# Patient Record
Sex: Female | Born: 1935 | Race: White | Hispanic: No | Smoking: Never smoker
Health system: Southern US, Community
[De-identification: ages and names within clinical notes are randomized; demographics above are authoritative.]

## PROBLEM LIST (undated history)

## (undated) DIAGNOSIS — I10 Essential (primary) hypertension: Secondary | ICD-10-CM

## (undated) DIAGNOSIS — I480 Paroxysmal atrial fibrillation: Secondary | ICD-10-CM

## (undated) DIAGNOSIS — K219 Gastro-esophageal reflux disease without esophagitis: Secondary | ICD-10-CM

## (undated) DIAGNOSIS — E1149 Type 2 diabetes mellitus with other diabetic neurological complication: Secondary | ICD-10-CM

## (undated) DIAGNOSIS — R42 Dizziness and giddiness: Secondary | ICD-10-CM

## (undated) DIAGNOSIS — I639 Cerebral infarction, unspecified: Secondary | ICD-10-CM

## (undated) DIAGNOSIS — J449 Chronic obstructive pulmonary disease, unspecified: Secondary | ICD-10-CM

## (undated) DIAGNOSIS — J45909 Unspecified asthma, uncomplicated: Secondary | ICD-10-CM

## (undated) DIAGNOSIS — I739 Peripheral vascular disease, unspecified: Principal | ICD-10-CM

## (undated) DIAGNOSIS — IMO0001 Reserved for inherently not codable concepts without codable children: Secondary | ICD-10-CM

## (undated) DIAGNOSIS — E119 Type 2 diabetes mellitus without complications: Principal | ICD-10-CM

## (undated) HISTORY — PX: OTHER SURGICAL HISTORY: SHX169

## (undated) HISTORY — DX: Reserved for inherently not codable concepts without codable children: IMO0001

## (undated) HISTORY — PX: FOOT SURGERY: SHX648

## (undated) HISTORY — PX: FLEXIBLE SIGMOIDOSCOPY: SHX1649

## (undated) HISTORY — DX: Essential (primary) hypertension: I10

## (undated) HISTORY — PX: LAPAROTOMY: SHX154

## (undated) HISTORY — DX: Type 2 diabetes mellitus without complications: E11.9

## (undated) HISTORY — DX: Gastro-esophageal reflux disease without esophagitis: K21.9

## (undated) HISTORY — DX: Unspecified asthma, uncomplicated: J45.909

## (undated) HISTORY — PX: TUBAL LIGATION: SHX77

## (undated) HISTORY — DX: Type 2 diabetes mellitus with other diabetic neurological complication: E11.49

## (undated) HISTORY — DX: Cerebral infarction, unspecified: I63.9

## (undated) HISTORY — DX: Paroxysmal atrial fibrillation: I48.0

## (undated) HISTORY — DX: Peripheral vascular disease, unspecified: I73.9

## (undated) HISTORY — PX: TONSILLECTOMY: SUR1361

---

## 1969-05-08 HISTORY — PX: ABDOMINAL HYSTERECTOMY: SHX81

## 1969-05-08 HISTORY — PX: APPENDECTOMY: SHX54

## 1991-12-15 ENCOUNTER — Encounter: Payer: Self-pay | Admitting: Gastroenterology

## 1995-11-15 ENCOUNTER — Encounter: Payer: Self-pay | Admitting: Gastroenterology

## 1995-11-21 ENCOUNTER — Encounter: Payer: Self-pay | Admitting: Gastroenterology

## 1997-10-26 ENCOUNTER — Ambulatory Visit (HOSPITAL_COMMUNITY): Admission: RE | Admit: 1997-10-26 | Discharge: 1997-10-26 | Payer: Self-pay | Admitting: General Surgery

## 1997-10-28 ENCOUNTER — Ambulatory Visit (HOSPITAL_COMMUNITY): Admission: RE | Admit: 1997-10-28 | Discharge: 1997-10-28 | Payer: Self-pay | Admitting: General Surgery

## 1998-03-09 ENCOUNTER — Ambulatory Visit (HOSPITAL_COMMUNITY): Admission: RE | Admit: 1998-03-09 | Discharge: 1998-03-09 | Payer: Self-pay | Admitting: *Deleted

## 1998-03-10 ENCOUNTER — Ambulatory Visit (HOSPITAL_COMMUNITY): Admission: RE | Admit: 1998-03-10 | Discharge: 1998-03-10 | Payer: Self-pay | Admitting: *Deleted

## 1998-04-08 ENCOUNTER — Encounter: Payer: Self-pay | Admitting: Gastroenterology

## 1998-04-09 ENCOUNTER — Inpatient Hospital Stay (HOSPITAL_COMMUNITY): Admission: RE | Admit: 1998-04-09 | Discharge: 1998-04-18 | Payer: Self-pay | Admitting: *Deleted

## 1998-09-21 ENCOUNTER — Other Ambulatory Visit: Admission: RE | Admit: 1998-09-21 | Discharge: 1998-09-21 | Payer: Self-pay | Admitting: Obstetrics & Gynecology

## 1998-12-12 ENCOUNTER — Ambulatory Visit (HOSPITAL_COMMUNITY): Admission: RE | Admit: 1998-12-12 | Discharge: 1998-12-12 | Payer: Self-pay | Admitting: Gastroenterology

## 1999-01-14 ENCOUNTER — Ambulatory Visit (HOSPITAL_COMMUNITY): Admission: RE | Admit: 1999-01-14 | Discharge: 1999-01-14 | Payer: Self-pay | Admitting: Gastroenterology

## 1999-10-26 ENCOUNTER — Other Ambulatory Visit: Admission: RE | Admit: 1999-10-26 | Discharge: 1999-10-26 | Payer: Self-pay | Admitting: Obstetrics & Gynecology

## 2000-11-21 ENCOUNTER — Other Ambulatory Visit: Admission: RE | Admit: 2000-11-21 | Discharge: 2000-11-21 | Payer: Self-pay | Admitting: Obstetrics & Gynecology

## 2000-12-10 ENCOUNTER — Encounter: Payer: Self-pay | Admitting: General Surgery

## 2000-12-14 ENCOUNTER — Encounter (INDEPENDENT_AMBULATORY_CARE_PROVIDER_SITE_OTHER): Payer: Self-pay | Admitting: Specialist

## 2000-12-14 ENCOUNTER — Ambulatory Visit: Admission: RE | Admit: 2000-12-14 | Discharge: 2000-12-14 | Payer: Self-pay | Admitting: General Surgery

## 2001-06-21 ENCOUNTER — Encounter: Payer: Self-pay | Admitting: Urology

## 2001-06-21 ENCOUNTER — Encounter: Admission: RE | Admit: 2001-06-21 | Discharge: 2001-06-21 | Payer: Self-pay | Admitting: Urology

## 2001-06-24 ENCOUNTER — Ambulatory Visit (HOSPITAL_BASED_OUTPATIENT_CLINIC_OR_DEPARTMENT_OTHER): Admission: RE | Admit: 2001-06-24 | Discharge: 2001-06-24 | Payer: Self-pay | Admitting: Urology

## 2001-12-19 ENCOUNTER — Other Ambulatory Visit: Admission: RE | Admit: 2001-12-19 | Discharge: 2001-12-19 | Payer: Self-pay | Admitting: Obstetrics & Gynecology

## 2002-05-27 ENCOUNTER — Encounter (INDEPENDENT_AMBULATORY_CARE_PROVIDER_SITE_OTHER): Payer: Self-pay | Admitting: Cardiology

## 2002-05-27 ENCOUNTER — Ambulatory Visit (HOSPITAL_COMMUNITY): Admission: RE | Admit: 2002-05-27 | Discharge: 2002-05-27 | Payer: Self-pay | Admitting: Family Medicine

## 2002-06-23 ENCOUNTER — Encounter: Payer: Self-pay | Admitting: Gastroenterology

## 2002-08-07 ENCOUNTER — Ambulatory Visit (HOSPITAL_COMMUNITY): Admission: RE | Admit: 2002-08-07 | Discharge: 2002-08-07 | Payer: Self-pay | Admitting: Pulmonary Disease

## 2002-08-07 ENCOUNTER — Encounter: Payer: Self-pay | Admitting: Pulmonary Disease

## 2002-09-01 ENCOUNTER — Encounter: Payer: Self-pay | Admitting: Gastroenterology

## 2003-02-05 ENCOUNTER — Other Ambulatory Visit: Admission: RE | Admit: 2003-02-05 | Discharge: 2003-02-05 | Payer: Self-pay | Admitting: Obstetrics & Gynecology

## 2003-04-08 ENCOUNTER — Encounter: Payer: Self-pay | Admitting: Gastroenterology

## 2005-01-11 ENCOUNTER — Ambulatory Visit: Payer: Self-pay | Admitting: Gastroenterology

## 2005-01-31 ENCOUNTER — Ambulatory Visit: Payer: Self-pay | Admitting: Gastroenterology

## 2005-01-31 ENCOUNTER — Encounter (INDEPENDENT_AMBULATORY_CARE_PROVIDER_SITE_OTHER): Payer: Self-pay | Admitting: Specialist

## 2005-02-01 ENCOUNTER — Encounter: Payer: Self-pay | Admitting: Gastroenterology

## 2005-02-01 ENCOUNTER — Encounter: Admission: RE | Admit: 2005-02-01 | Discharge: 2005-02-01 | Payer: Self-pay | Admitting: Gastroenterology

## 2005-02-23 ENCOUNTER — Other Ambulatory Visit: Admission: RE | Admit: 2005-02-23 | Discharge: 2005-02-23 | Payer: Self-pay | Admitting: Obstetrics & Gynecology

## 2005-03-09 ENCOUNTER — Ambulatory Visit: Payer: Self-pay | Admitting: Gastroenterology

## 2008-06-14 ENCOUNTER — Inpatient Hospital Stay (HOSPITAL_COMMUNITY): Admission: EM | Admit: 2008-06-14 | Discharge: 2008-06-18 | Payer: Self-pay | Admitting: Emergency Medicine

## 2008-06-16 ENCOUNTER — Encounter (INDEPENDENT_AMBULATORY_CARE_PROVIDER_SITE_OTHER): Payer: Self-pay | Admitting: Internal Medicine

## 2008-07-15 ENCOUNTER — Ambulatory Visit: Payer: Self-pay | Admitting: Family Medicine

## 2008-07-15 DIAGNOSIS — K219 Gastro-esophageal reflux disease without esophagitis: Secondary | ICD-10-CM

## 2008-07-15 DIAGNOSIS — J45909 Unspecified asthma, uncomplicated: Secondary | ICD-10-CM | POA: Insufficient documentation

## 2008-07-15 DIAGNOSIS — IMO0002 Reserved for concepts with insufficient information to code with codable children: Secondary | ICD-10-CM | POA: Insufficient documentation

## 2008-07-15 DIAGNOSIS — I10 Essential (primary) hypertension: Secondary | ICD-10-CM | POA: Insufficient documentation

## 2008-07-15 DIAGNOSIS — R32 Unspecified urinary incontinence: Secondary | ICD-10-CM | POA: Insufficient documentation

## 2008-07-15 DIAGNOSIS — E119 Type 2 diabetes mellitus without complications: Secondary | ICD-10-CM

## 2008-07-15 DIAGNOSIS — K5730 Diverticulosis of large intestine without perforation or abscess without bleeding: Secondary | ICD-10-CM | POA: Insufficient documentation

## 2008-07-15 DIAGNOSIS — E1165 Type 2 diabetes mellitus with hyperglycemia: Secondary | ICD-10-CM | POA: Insufficient documentation

## 2008-07-15 HISTORY — DX: Unspecified asthma, uncomplicated: J45.909

## 2008-07-15 HISTORY — DX: Gastro-esophageal reflux disease without esophagitis: K21.9

## 2008-07-15 HISTORY — DX: Essential (primary) hypertension: I10

## 2008-07-15 HISTORY — DX: Type 2 diabetes mellitus without complications: E11.9

## 2008-07-15 LAB — CONVERTED CEMR LAB: Blood Glucose, Fingerstick: 195

## 2008-07-20 ENCOUNTER — Telehealth: Payer: Self-pay | Admitting: Family Medicine

## 2008-07-30 ENCOUNTER — Ambulatory Visit: Payer: Self-pay | Admitting: Family Medicine

## 2008-08-13 ENCOUNTER — Ambulatory Visit: Payer: Self-pay | Admitting: Family Medicine

## 2008-10-15 ENCOUNTER — Ambulatory Visit: Payer: Self-pay | Admitting: Family Medicine

## 2008-10-19 ENCOUNTER — Telehealth: Payer: Self-pay | Admitting: Family Medicine

## 2008-10-22 ENCOUNTER — Encounter: Payer: Self-pay | Admitting: Family Medicine

## 2008-10-23 ENCOUNTER — Telehealth: Payer: Self-pay | Admitting: Family Medicine

## 2008-11-12 ENCOUNTER — Ambulatory Visit: Payer: Self-pay | Admitting: Family Medicine

## 2008-11-12 DIAGNOSIS — E039 Hypothyroidism, unspecified: Secondary | ICD-10-CM | POA: Insufficient documentation

## 2008-11-13 LAB — CONVERTED CEMR LAB
BUN: 17 mg/dL (ref 6–23)
CO2: 27 meq/L (ref 19–32)
Calcium: 9.4 mg/dL (ref 8.4–10.5)
Chloride: 101 meq/L (ref 96–112)
Cholesterol: 168 mg/dL (ref 0–200)
Creatinine, Ser: 0.8 mg/dL (ref 0.4–1.2)
Direct LDL: 109.3 mg/dL
GFR calc non Af Amer: 75.04 mL/min (ref >60)
Glucose, Bld: 161 mg/dL — ABNORMAL HIGH (ref 70–99)
HDL: 22.6 mg/dL — ABNORMAL LOW (ref >39.00)
Hgb A1c MFr Bld: 7.5 % — ABNORMAL HIGH (ref 4.6–6.5)
Potassium: 4.9 meq/L (ref 3.5–5.1)
Sodium: 139 meq/L (ref 135–145)
TSH: 1.71 microintl units/mL (ref 0.35–5.50)
Total CHOL/HDL Ratio: 7
Triglycerides: 276 mg/dL — ABNORMAL HIGH (ref 0.0–149.0)
VLDL: 55.2 mg/dL — ABNORMAL HIGH (ref 0.0–40.0)

## 2008-11-25 ENCOUNTER — Ambulatory Visit: Payer: Self-pay | Admitting: Pulmonary Disease

## 2008-11-25 ENCOUNTER — Encounter: Payer: Self-pay | Admitting: Pulmonary Disease

## 2008-11-25 DIAGNOSIS — G4733 Obstructive sleep apnea (adult) (pediatric): Secondary | ICD-10-CM | POA: Insufficient documentation

## 2008-11-25 DIAGNOSIS — J309 Allergic rhinitis, unspecified: Secondary | ICD-10-CM | POA: Insufficient documentation

## 2008-11-29 ENCOUNTER — Encounter: Payer: Self-pay | Admitting: Pulmonary Disease

## 2008-12-22 ENCOUNTER — Telehealth: Payer: Self-pay | Admitting: Family Medicine

## 2008-12-22 ENCOUNTER — Telehealth: Payer: Self-pay | Admitting: Pulmonary Disease

## 2008-12-23 ENCOUNTER — Ambulatory Visit: Payer: Self-pay | Admitting: Pulmonary Disease

## 2009-01-01 ENCOUNTER — Ambulatory Visit: Payer: Self-pay | Admitting: Pulmonary Disease

## 2009-01-29 ENCOUNTER — Ambulatory Visit: Payer: Self-pay | Admitting: Pulmonary Disease

## 2009-01-29 DIAGNOSIS — R490 Dysphonia: Secondary | ICD-10-CM | POA: Insufficient documentation

## 2009-02-03 LAB — CONVERTED CEMR LAB
ALT: 24 units/L (ref 0–35)
AST: 26 units/L (ref 0–37)
Albumin: 3.9 g/dL (ref 3.5–5.2)
Alkaline Phosphatase: 65 units/L (ref 39–117)
BUN: 15 mg/dL (ref 6–23)
Basophils Absolute: 0.1 10*3/uL (ref 0.0–0.1)
Basophils Relative: 0.7 % (ref 0.0–3.0)
Bilirubin, Direct: 0.1 mg/dL (ref 0.0–0.3)
CO2: 28 meq/L (ref 19–32)
Calcium: 9.5 mg/dL (ref 8.4–10.5)
Chloride: 101 meq/L (ref 96–112)
Creatinine, Ser: 0.9 mg/dL (ref 0.4–1.2)
Eosinophils Absolute: 0.5 10*3/uL (ref 0.0–0.7)
Eosinophils Relative: 4.5 % (ref 0.0–5.0)
GFR calc non Af Amer: 65.46 mL/min (ref >60)
Glucose, Bld: 120 mg/dL — ABNORMAL HIGH (ref 70–99)
HCT: 37 % (ref 36.0–46.0)
Hemoglobin: 12.4 g/dL (ref 12.0–15.0)
IgE (Immunoglobulin E), Serum: 86.5 intl units/mL (ref 0.0–180.0)
Lymphocytes Relative: 29.5 % (ref 12.0–46.0)
Lymphs Abs: 3 10*3/uL (ref 0.7–4.0)
MCHC: 33.5 g/dL (ref 30.0–36.0)
MCV: 86.2 fL (ref 78.0–100.0)
Monocytes Absolute: 0.9 10*3/uL (ref 0.1–1.0)
Monocytes Relative: 8.7 % (ref 3.0–12.0)
Neutro Abs: 5.7 10*3/uL (ref 1.4–7.7)
Neutrophils Relative %: 56.6 % (ref 43.0–77.0)
Platelets: 250 10*3/uL (ref 150.0–400.0)
Potassium: 4 meq/L (ref 3.5–5.1)
RBC: 4.3 M/uL (ref 3.87–5.11)
RDW: 14.4 % (ref 11.5–14.6)
Sodium: 136 meq/L (ref 135–145)
Total Bilirubin: 0.5 mg/dL (ref 0.3–1.2)
Total Protein: 7.4 g/dL (ref 6.0–8.3)
WBC: 10.2 10*3/uL (ref 4.5–10.5)

## 2009-02-12 ENCOUNTER — Ambulatory Visit: Payer: Self-pay | Admitting: Family Medicine

## 2009-02-12 LAB — CONVERTED CEMR LAB: Hgb A1c MFr Bld: 7.5 % — ABNORMAL HIGH (ref 4.6–6.5)

## 2009-02-23 ENCOUNTER — Ambulatory Visit: Payer: Self-pay | Admitting: Pulmonary Disease

## 2009-03-04 ENCOUNTER — Encounter: Payer: Self-pay | Admitting: Pulmonary Disease

## 2009-03-04 ENCOUNTER — Encounter: Payer: Self-pay | Admitting: Family Medicine

## 2009-03-11 ENCOUNTER — Encounter (INDEPENDENT_AMBULATORY_CARE_PROVIDER_SITE_OTHER): Payer: Self-pay | Admitting: Otolaryngology

## 2009-03-11 ENCOUNTER — Ambulatory Visit (HOSPITAL_COMMUNITY): Admission: RE | Admit: 2009-03-11 | Discharge: 2009-03-12 | Payer: Self-pay | Admitting: Otolaryngology

## 2009-03-22 ENCOUNTER — Telehealth: Payer: Self-pay | Admitting: Family Medicine

## 2009-03-24 ENCOUNTER — Inpatient Hospital Stay (HOSPITAL_COMMUNITY): Admission: AD | Admit: 2009-03-24 | Discharge: 2009-03-27 | Payer: Self-pay | Admitting: Cardiovascular Disease

## 2009-05-31 ENCOUNTER — Telehealth: Payer: Self-pay | Admitting: Family Medicine

## 2009-06-10 ENCOUNTER — Encounter: Payer: Self-pay | Admitting: Family Medicine

## 2009-06-14 ENCOUNTER — Ambulatory Visit: Payer: Self-pay | Admitting: Surgery

## 2009-06-18 DIAGNOSIS — I739 Peripheral vascular disease, unspecified: Secondary | ICD-10-CM

## 2009-06-18 HISTORY — DX: Peripheral vascular disease, unspecified: I73.9

## 2009-06-18 HISTORY — PX: CAROTID ENDARTERECTOMY: SUR193

## 2009-06-22 ENCOUNTER — Ambulatory Visit: Payer: Self-pay | Admitting: Surgery

## 2009-06-22 ENCOUNTER — Inpatient Hospital Stay (HOSPITAL_COMMUNITY): Admission: RE | Admit: 2009-06-22 | Discharge: 2009-06-23 | Payer: Self-pay | Admitting: Surgery

## 2009-06-23 ENCOUNTER — Encounter: Payer: Self-pay | Admitting: Surgery

## 2009-06-24 LAB — HM COLONOSCOPY

## 2009-07-12 ENCOUNTER — Ambulatory Visit: Payer: Self-pay | Admitting: Surgery

## 2009-07-13 ENCOUNTER — Ambulatory Visit: Payer: Self-pay | Admitting: Family Medicine

## 2009-07-13 DIAGNOSIS — E785 Hyperlipidemia, unspecified: Secondary | ICD-10-CM | POA: Insufficient documentation

## 2009-07-15 LAB — CONVERTED CEMR LAB
ALT: 28 units/L (ref 0–35)
AST: 37 units/L (ref 0–37)
Albumin: 3.9 g/dL (ref 3.5–5.2)
Alkaline Phosphatase: 83 units/L (ref 39–117)
BUN: 21 mg/dL (ref 6–23)
Bilirubin, Direct: 0.1 mg/dL (ref 0.0–0.3)
CO2: 31 meq/L (ref 19–32)
Calcium: 9.6 mg/dL (ref 8.4–10.5)
Chloride: 103 meq/L (ref 96–112)
Cholesterol: 90 mg/dL (ref 0–200)
Creatinine, Ser: 0.8 mg/dL (ref 0.4–1.2)
Creatinine,U: 133.8 mg/dL
Direct LDL: 42.4 mg/dL
GFR calc non Af Amer: 74.9 mL/min (ref >60)
Glucose, Bld: 141 mg/dL — ABNORMAL HIGH (ref 70–99)
HDL: 26.9 mg/dL — ABNORMAL LOW (ref >39.00)
Hgb A1c MFr Bld: 7.4 % — ABNORMAL HIGH (ref 4.6–6.5)
Microalb Creat Ratio: 349.8 mg/g — ABNORMAL HIGH (ref 0.0–30.0)
Microalb, Ur: 46.8 mg/dL — ABNORMAL HIGH (ref 0.0–1.9)
Potassium: 4.6 meq/L (ref 3.5–5.1)
Sodium: 141 meq/L (ref 135–145)
TSH: 2.49 microintl units/mL (ref 0.35–5.50)
Total Bilirubin: 0.2 mg/dL — ABNORMAL LOW (ref 0.3–1.2)
Total CHOL/HDL Ratio: 3
Total Protein: 7.2 g/dL (ref 6.0–8.3)
Triglycerides: 257 mg/dL — ABNORMAL HIGH (ref 0.0–149.0)
VLDL: 51.4 mg/dL — ABNORMAL HIGH (ref 0.0–40.0)

## 2009-07-27 ENCOUNTER — Encounter (INDEPENDENT_AMBULATORY_CARE_PROVIDER_SITE_OTHER): Payer: Self-pay | Admitting: *Deleted

## 2009-07-28 ENCOUNTER — Telehealth: Payer: Self-pay | Admitting: Family Medicine

## 2009-08-16 ENCOUNTER — Inpatient Hospital Stay (HOSPITAL_COMMUNITY): Admission: EM | Admit: 2009-08-16 | Discharge: 2009-08-21 | Payer: Self-pay | Admitting: Emergency Medicine

## 2009-08-23 ENCOUNTER — Inpatient Hospital Stay (HOSPITAL_COMMUNITY): Admission: EM | Admit: 2009-08-23 | Discharge: 2009-08-25 | Payer: Self-pay | Admitting: Emergency Medicine

## 2009-08-23 ENCOUNTER — Encounter (INDEPENDENT_AMBULATORY_CARE_PROVIDER_SITE_OTHER): Payer: Self-pay | Admitting: *Deleted

## 2009-08-30 ENCOUNTER — Telehealth: Payer: Self-pay | Admitting: Family Medicine

## 2009-09-02 ENCOUNTER — Ambulatory Visit: Payer: Self-pay | Admitting: Family Medicine

## 2009-09-02 DIAGNOSIS — E1140 Type 2 diabetes mellitus with diabetic neuropathy, unspecified: Secondary | ICD-10-CM | POA: Insufficient documentation

## 2009-09-02 DIAGNOSIS — E1149 Type 2 diabetes mellitus with other diabetic neurological complication: Secondary | ICD-10-CM

## 2009-09-02 DIAGNOSIS — K5732 Diverticulitis of large intestine without perforation or abscess without bleeding: Secondary | ICD-10-CM | POA: Insufficient documentation

## 2009-09-02 HISTORY — DX: Type 2 diabetes mellitus with other diabetic neurological complication: E11.49

## 2009-09-03 LAB — CONVERTED CEMR LAB
BUN: 10 mg/dL (ref 6–23)
Basophils Absolute: 0.1 10*3/uL (ref 0.0–0.1)
Basophils Relative: 0.4 % (ref 0.0–3.0)
CO2: 30 meq/L (ref 19–32)
Calcium: 9.5 mg/dL (ref 8.4–10.5)
Chloride: 96 meq/L (ref 96–112)
Creatinine, Ser: 0.8 mg/dL (ref 0.4–1.2)
Eosinophils Absolute: 0.3 10*3/uL (ref 0.0–0.7)
Eosinophils Relative: 2.5 % (ref 0.0–5.0)
GFR calc non Af Amer: 74.87 mL/min (ref >60)
Glucose, Bld: 172 mg/dL — ABNORMAL HIGH (ref 70–99)
HCT: 40 % (ref 36.0–46.0)
Hemoglobin: 13.3 g/dL (ref 12.0–15.0)
Lymphocytes Relative: 23.8 % (ref 12.0–46.0)
Lymphs Abs: 3 10*3/uL (ref 0.7–4.0)
MCHC: 33.3 g/dL (ref 30.0–36.0)
MCV: 87.3 fL (ref 78.0–100.0)
Monocytes Absolute: 0.7 10*3/uL (ref 0.1–1.0)
Monocytes Relative: 5.7 % (ref 3.0–12.0)
Neutro Abs: 8.4 10*3/uL — ABNORMAL HIGH (ref 1.4–7.7)
Neutrophils Relative %: 67.6 % (ref 43.0–77.0)
Platelets: 347 10*3/uL (ref 150.0–400.0)
Potassium: 3.9 meq/L (ref 3.5–5.1)
RBC: 4.59 M/uL (ref 3.87–5.11)
RDW: 16.2 % — ABNORMAL HIGH (ref 11.5–14.6)
Sodium: 136 meq/L (ref 135–145)
WBC: 12.5 10*3/uL — ABNORMAL HIGH (ref 4.5–10.5)

## 2009-09-16 ENCOUNTER — Ambulatory Visit: Payer: Self-pay | Admitting: Family Medicine

## 2009-11-15 ENCOUNTER — Telehealth: Payer: Self-pay | Admitting: Family Medicine

## 2009-11-22 ENCOUNTER — Encounter (INDEPENDENT_AMBULATORY_CARE_PROVIDER_SITE_OTHER): Payer: Self-pay | Admitting: *Deleted

## 2009-12-16 ENCOUNTER — Encounter: Payer: Self-pay | Admitting: Family Medicine

## 2009-12-17 ENCOUNTER — Ambulatory Visit: Payer: Self-pay | Admitting: Family Medicine

## 2009-12-17 LAB — CONVERTED CEMR LAB: Hgb A1c MFr Bld: 8.1 % — ABNORMAL HIGH (ref 4.6–6.5)

## 2009-12-24 ENCOUNTER — Ambulatory Visit: Payer: Self-pay | Admitting: Family Medicine

## 2009-12-24 LAB — HM DIABETES FOOT EXAM

## 2009-12-24 LAB — CONVERTED CEMR LAB
Cholesterol, target level: 200 mg/dL
HDL goal, serum: 40 mg/dL
LDL Goal: 100 mg/dL

## 2009-12-24 LAB — HM DIABETES EYE EXAM: HM Diabetic Eye Exam: NORMAL

## 2010-01-12 ENCOUNTER — Ambulatory Visit: Payer: Self-pay | Admitting: Gastroenterology

## 2010-01-12 DIAGNOSIS — R141 Gas pain: Secondary | ICD-10-CM | POA: Insufficient documentation

## 2010-01-12 DIAGNOSIS — R143 Flatulence: Secondary | ICD-10-CM

## 2010-01-12 DIAGNOSIS — R195 Other fecal abnormalities: Secondary | ICD-10-CM | POA: Insufficient documentation

## 2010-01-12 DIAGNOSIS — R142 Eructation: Secondary | ICD-10-CM

## 2010-01-17 ENCOUNTER — Ambulatory Visit: Payer: Self-pay | Admitting: Gastroenterology

## 2010-01-17 ENCOUNTER — Ambulatory Visit (HOSPITAL_COMMUNITY)
Admission: RE | Admit: 2010-01-17 | Discharge: 2010-01-17 | Payer: Self-pay | Source: Home / Self Care | Admitting: Gastroenterology

## 2010-01-23 ENCOUNTER — Encounter: Payer: Self-pay | Admitting: Family Medicine

## 2010-01-30 ENCOUNTER — Encounter: Payer: Self-pay | Admitting: Family Medicine

## 2010-02-16 ENCOUNTER — Ambulatory Visit: Payer: Self-pay | Admitting: Family Medicine

## 2010-03-15 ENCOUNTER — Encounter: Payer: Self-pay | Admitting: Family Medicine

## 2010-03-25 ENCOUNTER — Ambulatory Visit: Payer: Self-pay | Admitting: Family Medicine

## 2010-03-25 DIAGNOSIS — L821 Other seborrheic keratosis: Secondary | ICD-10-CM | POA: Insufficient documentation

## 2010-03-29 ENCOUNTER — Telehealth: Payer: Self-pay | Admitting: Family Medicine

## 2010-03-29 LAB — CONVERTED CEMR LAB
Hgb A1c MFr Bld: 7.8 % — ABNORMAL HIGH (ref 4.6–6.5)
TSH: 2.2 microintl units/mL (ref 0.35–5.50)

## 2010-04-27 ENCOUNTER — Telehealth: Payer: Self-pay | Admitting: Family Medicine

## 2010-06-09 NOTE — Progress Notes (Signed)
Summary: PRIOR AUTH   Phone Note Call from Patient Call back at Home Phone [phone]   Caller: PT LIVE` Call For: Casey Peat MD Summary of Call: NEEDS PRIOR AUTH ON Casey Harrell HAS [employer] INS # [id number] [facility]  Initial call taken by: Roselle Locus,  October 19, 2008 1:30 PM  Follow-up for Phone Call        [facility] # [id number] [facility] WILL FAX FORM  Roselle Locus  October 19, 2008 3:31 PM form from ins to Dr Haskell Riling  October 20, 2008 7:44 AM  Additional Follow-up for Phone Call Additional follow up Details #1::        humana approved singulair Roselle Locus  October 23, 2008 9:11 AM

## 2010-06-09 NOTE — Letter (Signed)
Summary: Diabetic Eye Exam/[facility]  Diabetic Eye Exam/[facility]   Imported By: Maryln Gottron 03/25/2010 13:19:25  _____________________________________________________________________  External Attachment:    Type:   Image     Comment:   External Document

## 2010-06-09 NOTE — Procedures (Signed)
Summary: EGD   EGD  Procedure date:  09/01/2002  Findings:      Location: [facility]   Patient Name: Casey Harrell, Casey Harrell MRN:  Procedure Procedures: Panendoscopy (EGD) CPT: 43235.  Personnel: Endoscopist: Venita Lick. Russella Dar, MD, Clementeen Graham.  Exam Location: Exam performed in Outpatient Clinic. Outpatient  Patient Consent: Procedure, Alternatives, Risks and Benefits discussed, consent obtained, from patient. Consent was obtained by the RN.  Indications Symptoms: Reflux symptoms  History  Pre-Exam Physical: Performed Sep 01, 2002  Entire physical exam was normal.  Exam Exam Info: Maximum depth of insertion Duodenum, intended Duodenum. Vocal cords not visualized. Gastric retroflexion performed. ASA Classification: II. Tolerance: good.  Sedation Meds: Patient assessed and found to be appropriate for moderate (conscious) sedation. Fentanyl 50 mcg. given IV. Versed 5 mg. given IV. Cetacaine Spray 2 sprays given aerosolized.  Monitoring: BP and pulse monitoring done. Oximetry used. Supplemental O2 given  Findings Normal: Proximal Esophagus to Duodenal 2nd Portion.   Events  Unplanned Intervention: No unplanned interventions were required.  Unplanned Events: There were no complications. Plans Medication(s): Continue current medications.  Patient Education: Patient given standard instructions for: Reflux.  Disposition: After procedure patient sent to recovery. After recovery patient sent home.  Scheduling: Referring provider, to Danice Goltz, MD,  Office Visit, to Venita Lick. Russella Dar, MD, [facility], around Dec 01, 2002.    This report was created from the original endoscopy report, which was reviewed and signed by the above listed endoscopist.    cc: Danice Goltz, MD

## 2010-06-09 NOTE — Letter (Signed)
Summary: [facility] Instructions  [facility]  [address] [place], [place] [address]   Phone: [phone]  Fax: [phone]       Casey Harrell    12-14-1934    MRN: [id number]        Procedure Day /Date: Monday September 12th, 2011     Arrival Time: 8:30am     Procedure Time: 9:30am     Location of Procedure:                     _x _  [facility] ( Outpatient Registration)                        PREPARATION FOR COLONOSCOPY WITH MOVIPREP   Starting 5 days prior to your procedure 01/12/10 do not eat nuts, seeds, popcorn, corn, beans, peas,  salads, or any raw vegetables.  Do not take any fiber supplements (e.g. Metamucil, Citrucel, and Benefiber).  THE DAY BEFORE YOUR PROCEDURE         DATE: 01/16/10  DAY: Sunday  1.  Drink clear liquids the entire day-NO SOLID FOOD  2.  Do not drink anything colored red or purple.  Avoid juices with pulp.  No orange juice.  3.  Drink at least 64 oz. (8 glasses) of fluid/clear liquids during the day to prevent dehydration and help the prep work efficiently.  CLEAR LIQUIDS INCLUDE: Water Jello Ice Popsicles Tea (sugar ok, no milk/cream) Powdered fruit flavored drinks Coffee (sugar ok, no milk/cream) Gatorade Juice: apple, white grape, white cranberry  Lemonade Clear bullion, consomm, broth Carbonated beverages (any kind) Strained chicken noodle soup Hard Candy                             4.  In the morning, mix first dose of MoviPrep solution:    Empty 1 Pouch A and 1 Pouch B into the disposable container    Add lukewarm drinking water to the top line of the container. Mix to dissolve    Refrigerate (mixed solution should be used within 24 hrs)  5.  Begin drinking the prep at 5:00 p.m. The MoviPrep container is divided by 4 marks.   Every 15 minutes drink the solution down to the next mark (approximately 8 oz) until the full liter is complete.   6.  Follow completed prep with 16 oz of clear liquid of your  choice (Nothing red or purple).  Continue to drink clear liquids until bedtime.  7.  Before going to bed, mix second dose of MoviPrep solution:    Empty 1 Pouch A and 1 Pouch B into the disposable container    Add lukewarm drinking water to the top line of the container. Mix to dissolve    Refrigerate  THE DAY OF YOUR PROCEDURE      DATE: 01/17/10 DAY: Monday  Beginning at 4:30 a.m. (5 hours before procedure):         1. Every 15 minutes, drink the solution down to the next mark (approx 8 oz) until the full liter is complete.  2. Follow completed prep with 16 oz. of clear liquid of your choice.    3. You may drink clear liquids until 5:30am (4 HOURS BEFORE PROCEDURE).   MEDICATION INSTRUCTIONS  Unless otherwise instructed, you should take regular prescription medications with a small sip of water   as early as possible the morning  of your procedure.  Diabetic patients - see separate instructions.       OTHER INSTRUCTIONS  You will need a responsible adult at least 74 years of age to accompany you and drive you home.   This person must remain in the waiting room during your procedure.  Wear loose fitting clothing that is easily removed.  Leave jewelry and other valuables at home.  However, you may wish to bring a book to read or  an iPod/MP3 player to listen to music as you wait for your procedure to start.  Remove all body piercing jewelry and leave at home.  Total time from sign-in until discharge is approximately 2-3 hours.  You should go home directly after your procedure and rest.  You can resume normal activities the  day after your procedure.  The day of your procedure you should not:   Drive   Make legal decisions   Operate machinery   Drink alcohol   Return to work  You will receive specific instructions about eating, activities and medications before you leave.    The above instructions have been reviewed and explained to me by   Marchelle Folks.      I fully understand and can verbalize these instructions _____________________________ Date _________

## 2010-06-09 NOTE — Op Note (Signed)
[facility]   Imported By: Sherian Rein 09/08/2009 14:09:02  _____________________________________________________________________  External Attachment:    Type:   Image     Comment:   External Document

## 2010-06-09 NOTE — Op Note (Signed)
Summary: [facility]   Imported By: Sherian Rein 09/08/2009 14:04:15  _____________________________________________________________________  External Attachment:    Type:   Image     Comment:   External Document

## 2010-06-09 NOTE — Assessment & Plan Note (Signed)
Summary: hem pos/lk   History of Present Illness Visit Type: Initial Consult Primary GI MD: Elie Goody MD [facility] Primary Provider: Evelena Peat MD Requesting Provider: Varney Baas, MD Chief Complaint: Hem positive stool History of Present Illness:   This is a 76 year old female, who was found to have Hemoccult-positive stool. She notes an increase in gas and bloating over the past several months that have responded partially to Gas-X. She previously underwent colonoscopy in September 2006 showing internal and external hemorrhoids, diverticulosis, and hyperplastic colon polyps.   GI Review of Systems    Reports acid reflux and  weight gain.      Denies abdominal pain, belching, bloating, chest pain, dysphagia with liquids, dysphagia with solids, heartburn, loss of appetite, nausea, vomiting, vomiting blood, and  weight loss.      Reports diverticulosis, heme positive stool, and  hemorrhoids.     Denies anal fissure, black tarry stools, change in bowel habit, constipation, diarrhea, fecal incontinence, irritable bowel syndrome, jaundice, light color stool, liver problems, rectal bleeding, and  rectal pain.   Current Medications (verified): 1)  Synthroid 50 Mcg Tabs (Levothyroxine Sodium) .... Once Daily 2)  Cardizem Cd 120 Mg Xr24h-Cap (Diltiazem Hcl Coated Beads) .... Once Daily 3)  Benicar 20 Mg Tabs (Olmesartan Medoxomil) .... One By Mouth Daily 4)  Aspirin 81 Mg Tbec (Aspirin) .... Once Daily 5)  Simvastatin 20 Mg Tabs (Simvastatin) .... One Tab At Bedtime Daily 6)  Metformin Hcl 1000 Mg Tabs (Metformin Hcl) .... One By Mouth Bid 7)  Lantus Solostar 100 Unit/ml Soln (Insulin Glargine) .... 90 Units Daily 8)  Cymbalta 60 Mg Cpep (Duloxetine Hcl) .... Once Daily 9)  Bd U/f Short Pen Needle 31g X 8 Mm Misc (Insulin Pen Needle) .... Use As Directed 10)  Biotin .Marland Kitchen.. 1 By Mouth Daily 11)  Carvedilol 25 Mg Tabs (Carvedilol) .... One Tab Two Times A Day 12)  Nexium 40 Mg Cpdr  (Esomeprazole Magnesium) .... One By Mouth Once Daily 13)  Novolog Flexpen 100 Unit/ml Soln (Insulin Aspart) .... 9 Units With Breakfast and Supper, 5 Units At Lunchtime 14)  Amitriptyline Hcl 25 Mg Tabs (Amitriptyline Hcl) .... One Tab At Bedtime 15)  Niaspan 500 Mg Cr-Tabs (Niacin (Antihyperlipidemic)) .... One By Mouth Once Daily  Allergies (verified): 1)  ! Doxycycline Hyclate (Doxycycline Hyclate) 2)  ! Avelox (Moxifloxacin Hcl) 3)  Morphine Sulfate (Morphine Sulfate)  Past History:  Past Medical History: Reviewed history from 09/07/2009 and no changes required. Asthma      - Spirometry 11/25/08 FEV1 1.48(86%), FVC 1.85(75%), FEV1% 80      - IgE 01/29/09 was 86.5      - CXR 7/21, normal Allergic rhinitis Diverticulosis, colon GERD Hypertension Urinary incontinence Diabetes Diabetic peripheral neuropathy OSA, intolerant of CPAP Hypothyroidism Diverticulosis Hemorrhoids Hyperplastic polyps 01/2005  Past Surgical History: Appendectomy 1971 Hysterectomy  1971 Tonsillectomy Tubal ligation C-section Sigmoid colectomy for diverticulitis Laparotomy x 3 with adhesions lysis Right foot surgery x 2  Family History: Reviewed history from 09/07/2009 and no changes required. Family History of Arthritis  patent Family History Diabetes 1st degree relative  parent Family History Lung cancer  grandparent Family History of Stroke F 1st degree relative <60  grandparent Family History of Cardiovascular disorder  parent Family History of Celiac Disease:Sister  Social History: Reviewed history from 07/15/2008 and no changes required. Retired Married Never Smoked Alcohol use-no  Review of Systems       The patient complains of cough, fatigue, hearing problems,  itching, muscle pains/cramps, night sweats, skin rash, thirst - excessive, and urine leakage.         The pertinent positives and negatives are noted as above and in the HPI. All other ROS were reviewed and were  negative.   Vital Signs:  Patient profile:   76 year old female Height:      61 inches Weight:      153.13 pounds BMI:     29.04 Pulse rate:   68 / minute Pulse rhythm:   regular BP sitting:   120 / 78  (left arm) Cuff size:   regular  Vitals Entered By: June McMurray CMA Duncan Dull) (January 12, 2010 10:49 AM)  Physical Exam  General:  Well developed, well nourished, no acute distress.  Head:  Normocephalic and atraumatic. Eyes:  PERRLA, no icterus. Mouth:  No deformity or lesions, dentition normal. Lungs:  Clear throughout to auscultation. Heart:  Regular rate and rhythm; no murmurs, rubs,  or bruits. Abdomen:  Soft, nontender and nondistended. No masses, hepatosplenomegaly or hernias noted. Normal bowel sounds. Rectal:  deferred until time of colonoscopy.   Pulses:  Normal pulses noted. Extremities:  No clubbing, cyanosis, edema or deformities noted. Neurologic:  Alert and  oriented x4;  grossly normal neurologically. Cervical Nodes:  No significant cervical adenopathy. Inguinal Nodes:  No significant inguinal adenopathy. Psych:  Alert and cooperative. Normal mood and affect.  Impression & Recommendations:  Problem # 1:  NONSPECIFIC ABNORMAL FINDING IN STOOL CONTENTS (ICD-792.1) Rule out colorectal neoplasms and AVMs, hemorrhoids or other GI sources of occult blood loss. The risks, benefits and alternatives to colonoscopy with possible biopsy and possible polypectomy were discussed with the patient and they consent to proceed. The procedure will be scheduled electively. Orders: Colonoscopy (Colon)  Problem # 2:  FLATULENCE ERUCTATION AND GAS PAIN (ICD-787.3) Begin a low gas diet, and a trial of probiotics. Consider a trial of Xifaxan. Orders: Colonoscopy (Colon)  Problem # 3:  GERD (ICD-530.81) Continue Nexium 40 mg daily, and standard antireflux measures.  Patient Instructions: 1)  Start Align one tablet by mouth once daily x 1 month and if this medication helps then  continue as needed. 2)  Excessive Gas Diet handout given.  3)  Colonoscopy brochure given.  4)  Copy sent to : Varney Baas, MD 5)  The medication list was reviewed and reconciled.  All changed / newly prescribed medications were explained.  A complete medication list was provided to the patient / caregiver.  Prescriptions: MOVIPREP 100 GM  SOLR (PEG-KCL-NACL-NASULF-NA ASC-C) As per prep instructions.  #1 x 0   Entered by:   Christie Nottingham CMA (AAMA)   Authorized by:   Meryl Dare MD [facility]   Signed by:   Meryl Dare MD [facility] on 01/12/2010   Method used:   [facility] [address]       [address]       [place], [place]  [address]       Ph: [phone] or [phone]       Fax: [phone]   RxID:   [id number]

## 2010-06-09 NOTE — Medication Information (Signed)
Summary: Order for Glucose Testing Supplies  Order for Glucose Testing Supplies   Imported By: Maryln Gottron 12/20/2009 11:27:01  _____________________________________________________________________  External Attachment:    Type:   Image     Comment:   External Document

## 2010-06-09 NOTE — Medication Information (Signed)
Summary: Order for Diabetic Testing Supplies  Order for Diabetic Testing Supplies   Imported By: Maryln Gottron 02/02/2010 10:35:22  _____________________________________________________________________  External Attachment:    Type:   Image     Comment:   External Document

## 2010-06-09 NOTE — Assessment & Plan Note (Signed)
Summary: [name]/FUP/RCD   Vital Signs:  Patient profile:   76 year old female Weight:      153 pounds Temp:     98.3 degrees F oral BP sitting:   120 / 70  (left arm) Cuff size:   regular  Vitals Entered By: Sid Falcon LPN (December 24, 2009 11:15 AM)  Serial Vital Signs/Assessments:  Time      Position  BP       Pulse  Resp  Temp     By                     120/70                         Casey Peat MD   History of Present Illness: Here for follow up diabetes. REcent CBGs have been up -reviewed and fastings 150 to 221. On Lantus 30 and 45 (she is spliting dose) and Novolog 9 units at breakfast and supper. Recent A1C 8.1%.  Diabetes Management History:      She has not been enrolled in the "Diabetic Education Program".  She states understanding of dietary principles and is following her diet appropriately.  Sensory loss is noted.  Self foot exams are being performed.  She is checking home blood sugars.  She says that she is not exercising regularly.        Hypoglycemic symptoms are not occurring.  No hyperglycemic symptoms are reported.        The following changes have been made to her treatment plan since last visit: medication changes.  Treatment plan changes were initiated by patient.    Hypertension History:      She denies headache, chest pain, palpitations, dyspnea with exertion, orthopnea, peripheral edema, neurologic problems, syncope, and side effects from treatment.        Positive major cardiovascular risk factors include female age 44 years old or older, diabetes, hyperlipidemia, and hypertension.  Negative major cardiovascular risk factors include non-tobacco-user status.        Further assessment for target organ damage reveals no history of ASHD, stroke/TIA, or peripheral vascular disease.    Lipid Management History:      Positive NCEP/ATP III risk factors include female age 35 years old or older, diabetes, HDL cholesterol less than 40, and hypertension.   Negative NCEP/ATP III risk factors include non-tobacco-user status, no ASHD (atherosclerotic heart disease), no prior stroke/TIA, no peripheral vascular disease, and no history of aortic aneurysm.      Allergies: 1)  ! Doxycycline Hyclate (Doxycycline Hyclate) 2)  ! Avelox (Moxifloxacin Hcl) 3)  Morphine Sulfate (Morphine Sulfate)  Past History:  Past Medical History: Last updated: 09/07/2009 Asthma      - Spirometry 11/25/08 FEV1 1.48(86%), FVC 1.85(75%), FEV1% 80      - IgE 01/29/09 was 86.5      - CXR 7/21, normal Allergic rhinitis Diverticulosis, colon GERD Hypertension Urinary incontinence Diabetes Diabetic peripheral neuropathy OSA, intolerant of CPAP Hypothyroidism Diverticulosis Hemorrhoids Hyperplastic polyps 01/2005  Past Surgical History: Last updated: 09/07/2009 Appendectomy 1971 Hysterectomy  1971 Tonsillectomy Tubal ligation C-section Sigmoid colectomy Laparotomy x 3 with adhesions lysis Right foot surgery x 2  Social History: Last updated: 07/15/2008 Retired Married Never Smoked Alcohol use-no  Review of Systems      See HPI  Physical Exam  General:  Well-developed,well-nourished,in no acute distress; alert,appropriate and cooperative throughout examination Ears:  External ear exam shows no significant lesions  or deformities.  Otoscopic examination reveals clear canals, tympanic membranes are intact bilaterally without bulging, retraction, inflammation or discharge. Hearing is grossly normal bilaterally. Mouth:  Oral mucosa and oropharynx without lesions or exudates.  Teeth in good repair. Neck:  No deformities, masses, or tenderness noted. Lungs:  Normal respiratory effort, chest expands symmetrically. Lungs are clear to auscultation, no crackles or wheezes. Heart:  normal rate and regular rhythm.   Extremities:  no edema. Neurologic:  alert & oriented X3 and cranial nerves II-XII intact.   Skin:  no rashes and no suspicious lesions.     Cervical Nodes:  No lymphadenopathy noted  Diabetes Management Exam:    Foot Exam (with socks and/or shoes not present):       Sensory-Pinprick/Light touch:          Left medial foot (L-4): diminished          Left dorsal foot (L-5): diminished          Left lateral foot (S-1): diminished          Right medial foot (L-4): diminished          Right dorsal foot (L-5): diminished          Right lateral foot (S-1): diminished       Sensory-Monofilament:          Left foot: diminished          Right foot: diminished       Inspection:          Left foot: normal          Right foot: normal       Nails:          Left foot: normal          Right foot: normal    Eye Exam:       Eye Exam done here today          Results: normal   Impression & Recommendations:  Problem # 1:  DIABETES-TYPE 2 (ICD-250.00) Assessment Deteriorated Titrate Lantus and for simplification, go to once daily.  Add lunchtime Novolog. Her updated medication list for this problem includes:    Benicar 20 Mg Tabs (Olmesartan medoxomil) ..... One by mouth daily    Aspirin 81 Mg Tbec (Aspirin) ..... Once daily    Metformin Hcl 1000 Mg Tabs (Metformin hcl) ..... One by mouth bid    Lantus Solostar 100 Unit/ml Soln (Insulin glargine) .Marland KitchenMarland KitchenMarland KitchenMarland Kitchen 80 units daily    Novolog Flexpen 100 Unit/ml Soln (Insulin aspart) .Marland KitchenMarland KitchenMarland KitchenMarland Kitchen 5 units with breakfast and supper  Problem # 2:  DYSLIPIDEMIA (ICD-272.4)  Her updated medication list for this problem includes:    Simvastatin 20 Mg Tabs (Simvastatin) ..... One tab at bedtime daily    Niaspan 500 Mg Cr-tabs (Niacin (antihyperlipidemic)) ..... One by mouth once daily  Problem # 3:  HYPERTENSION (ICD-401.9)  Her updated medication list for this problem includes:    Cardizem Cd 120 Mg Xr24h-cap (Diltiazem hcl coated beads) ..... Once daily    Benicar 20 Mg Tabs (Olmesartan medoxomil) ..... One by mouth daily    Carvedilol 25 Mg Tabs (Carvedilol) ..... One tab two times a day  Complete  Medication List: 1)  Atrovent 0.5 % Soln (ipratropium Bromide)  .... Nebulized inhalation every 6 hours as needed 2)  Synthroid 50 Mcg Tabs (Levothyroxine sodium) .... Once daily 3)  Cardizem Cd 120 Mg Xr24h-cap (Diltiazem hcl coated beads) .... Once daily 4)  Benicar 20 Mg Tabs (  Olmesartan medoxomil) .... One by mouth daily 5)  Aspirin 81 Mg Tbec (Aspirin) .... Once daily 6)  Simvastatin 20 Mg Tabs (Simvastatin) .... One tab at bedtime daily 7)  Metformin Hcl 1000 Mg Tabs (Metformin hcl) .... One by mouth bid 8)  Lantus Solostar 100 Unit/ml Soln (Insulin glargine) .... 80 units daily 9)  Cymbalta 60 Mg Cpep (Duloxetine hcl) .... Once daily 10)  Bd U/f Short Pen Needle 31g X 8 Mm Misc (Insulin pen needle) .... Use as directed 11)  Biotin  .Marland Kitchen.. 1 by mouth daily 12)  Carvedilol 25 Mg Tabs (Carvedilol) .... One tab two times a day 13)  Nexium 40 Mg Cpdr (Esomeprazole magnesium) .... One by mouth once daily 14)  Novolog Flexpen 100 Unit/ml Soln (Insulin aspart) .... 5 units with breakfast and supper 15)  Amitriptyline Hcl 25 Mg Tabs (Amitriptyline hcl) .... One tab at bedtime 16)  Niaspan 500 Mg Cr-tabs (Niacin (antihyperlipidemic)) .... One by mouth once daily  Diabetes Management Assessment/Plan:      The following lipid goals have been established for the patient: Total cholesterol goal of 200; LDL cholesterol goal of 100; HDL cholesterol goal of 40; Triglyceride goal of 150.  Her blood pressure goal is < 130/80.    Hypertension Assessment/Plan:      The patient's hypertensive risk group is category C: Target organ damage and/or diabetes.  Today's blood pressure is 120/70.  Her blood pressure goal is < 130/80.  Lipid Assessment/Plan:      Based on NCEP/ATP III, the patient's risk factor category is "history of diabetes".  The patient's lipid goals are as follows: Total cholesterol goal is 200; LDL cholesterol goal is 100; HDL cholesterol goal is 40; Triglyceride goal is 150.    Patient  Instructions: 1)  Take Lantus once daily and go to 80 units. 2)  Consider adding 5 units of Novolog insulin at lunch. 3)  Titrate Lantus 2 units every 2 days for fasting sugar > 130. 4)  Please schedule a follow-up appointment in 3 months .

## 2010-06-09 NOTE — Assessment & Plan Note (Signed)
Summary: 4 month follow-up visit/pt rsc/cjr   Vital Signs:  Patient profile:   76 year old female Weight:      152 pounds Temp:     98.0 degrees F oral BP sitting:   130 / 70  (left arm) BP standing:   130 / 70 Cuff size:   regular  Vitals Entered By: Sid Falcon LPN (July 13, 1608 11:09 AM)  Serial Vital Signs/Assessments:  Time      Position  BP       Pulse  Resp  Temp     By           Lying LA  138/80                         Evelena Peat MD           Standing  130/70                         Evelena Peat MD  CC: 4 month follow-up   History of Present Illness: Patient seen for followup regarding multiple medical problems.  Recent hospitalization in November of 2010 for syncope and orthostatic changes. CT angiogram revealed no evidence for pulmonary emboli. Patient had several blood pressure medications adjusted during that visit has done better since then. Recent nuclear stress test unremarkable. Right carotid endarterectomy approximately 3 weeks ago for 90% right carotid lesion. She has done well following that. She did not have any preceding TIA type symptoms. No recent dizziness or syncope with medication changes.  Blood pressure medicines are reviewed. Denies any chest pains or palpitations. Compliant with all medications.  Type 2 diabetes with history of control. Currently on Lantus 30 units morning 45 units evening. Also takes metformin and Byetta. History of diabetic neuropathy which is unchanged. Recent blood sugars fasting 120s to 130s.  Hypothyroidism treated with Synthroid. Needs refills.  Diabetes Management History:      She states understanding of dietary principles and is following her diet appropriately.  No sensory loss is reported.  Self foot exams are being performed.  She is checking home blood sugars.  She says that she is not exercising regularly.    Preventive Screening-Counseling & Management  Caffeine-Diet-Exercise     Does Patient Exercise:  no  Allergies: 1)  ! Doxycycline Hyclate (Doxycycline Hyclate) 2)  ! Avelox (Moxifloxacin Hcl) 3)  Morphine Sulfate (Morphine Sulfate)  Past History:  Past Medical History: Last updated: 02/23/2009 Asthma      - Spirometry 11/25/08 FEV1 1.48(86%), FVC 1.85(75%), FEV1% 80      - IgE 01/29/09 was 86.5      - CXR 7/21, normal Allergic rhinitis Diverticulosis, colon GERD Hypertension Urinary incontinence Diabetes Diabetic peripheral neuropathy OSA, intolerant of CPAP  Social History: Last updated: 07/15/2008 Retired Married Never Smoked Alcohol use-no PMH reviewed for relevance, PSH reviewed for relevance  Social History: Does Patient Exercise:  no  Review of Systems  The patient denies anorexia, fever, weight loss, vision loss, chest pain, syncope, dyspnea on exertion, peripheral edema, prolonged cough, headaches, hemoptysis, abdominal pain, melena, and hematochezia.    Physical Exam  General:  Well-developed,well-nourished,in no acute distress; alert,appropriate and cooperative throughout examination Eyes:  pupils equal, pupils round, and pupils reactive to light.   Ears:  External ear exam shows no significant lesions or deformities.  Otoscopic examination reveals clear canals, tympanic membranes are intact bilaterally without bulging, retraction, inflammation or  discharge. Hearing is grossly normal bilaterally. Mouth:  Oral mucosa and oropharynx without lesions or exudates.  Teeth in good repair. Neck:  large scar right neck from previous surgery. No signs of erythema. Nontender Lungs:  Normal respiratory effort, chest expands symmetrically. Lungs are clear to auscultation, no crackles or wheezes. Heart:  normal rate and regular rhythm.   Extremities:  no edema. Neurologic:  alert & oriented X3, cranial nerves II-XII intact, strength normal in all extremities, and gait normal.   Psych:  normally interactive, good eye contact, not anxious appearing, and not depressed  appearing.    Diabetes Management Exam:    Foot Exam (with socks and/or shoes not present):       Sensory-Pinprick/Light touch:          Left medial foot (L-4): normal          Left dorsal foot (L-5): normal          Left lateral foot (S-1): normal          Right medial foot (L-4): normal          Right dorsal foot (L-5): normal          Right lateral foot (S-1): normal       Sensory-Monofilament:          Left foot: normal          Right foot: normal       Inspection:          Left foot: normal          Right foot: normal       Nails:          Left foot: normal          Right foot: normal    Eye Exam:       Eye Exam done elsewhere          Date: 02/05/2009          Results: diabetic retinopathy          Done by: Dr Dione Booze   Impression & Recommendations:  Problem # 1:  DIABETES-TYPE 2 (ICD-250.00) Recheck A1C. Her updated medication list for this problem includes:    Benicar 20 Mg Tabs (Olmesartan medoxomil) ..... Once daily    Aspirin 81 Mg Tbec (Aspirin) ..... Once daily    Byetta 10 Mcg Pen 10 Mcg/0.85ml Soln (Exenatide) ..... Once daily    Metformin Hcl 1000 Mg Tabs (Metformin hcl) ..... One by mouth bid    Lantus Solostar 100 Unit/ml Soln (Insulin glargine) .Marland KitchenMarland KitchenMarland KitchenMarland Kitchen 30 units q am and 45 units q pm    Aspirin 81 Mg Tabs (Aspirin) .Marland Kitchen... 2 tabs daily  Orders: TLB-A1C / Hgb A1C (Glycohemoglobin) (83036-A1C) TLB-Microalbumin/Creat Ratio, Urine (82043-MALB)  Problem # 2:  HYPERTENSION (ICD-401.9) meds updated and refills given. Her updated medication list for this problem includes:    Cardizem Cd 120 Mg Xr24h-cap (Diltiazem hcl coated beads) ..... Once daily    Benicar 20 Mg Tabs (Olmesartan medoxomil) ..... Once daily    Carvedilol 25 Mg Tabs (Carvedilol) ..... One tab two times a day  Orders: TLB-BMP (Basic Metabolic Panel-BMET) (80048-METABOL) Venipuncture ([address])  Problem # 3:  HYPOTHYROIDISM (ICD-244.9)  Her updated medication list for this problem includes:     Synthroid 50 Mcg Tabs (Levothyroxine sodium) ..... Qd  Orders: TLB-TSH (Thyroid Stimulating Hormone) (84443-TSH) Venipuncture (60454)  Problem # 4:  DYSLIPIDEMIA (ICD-272.4)  Her updated medication list for this problem includes:    Simvastatin  20 Mg Tabs (Simvastatin) ..... One tab at bedtime daily  Orders: TLB-Lipid Panel (80061-LIPID) TLB-Hepatic/Liver Function Pnl (80076-HEPATIC) Venipuncture (04540)  Complete Medication List: 1)  Atrovent 0.5 % Soln (ipratropium Bromide)  .... Nebulized inhalation every 6 hours as needed 2)  Nasonex 50 Mcg/act Susp (Mometasone furoate) .... As needed 3)  Loratadine 10 Mg Tabs (Loratadine) .... As needed 4)  Synthroid 50 Mcg Tabs (Levothyroxine sodium) .... Qd 5)  Cardizem Cd 120 Mg Xr24h-cap (Diltiazem hcl coated beads) .... Once daily 6)  Benicar 20 Mg Tabs (Olmesartan medoxomil) .... Once daily 7)  Aspirin 81 Mg Tbec (Aspirin) .... Once daily 8)  Simvastatin 20 Mg Tabs (Simvastatin) .... One tab at bedtime daily 9)  Byetta 10 Mcg Pen 10 Mcg/0.22ml Soln (Exenatide) .... Once daily 10)  Metformin Hcl 1000 Mg Tabs (Metformin hcl) .... One by mouth bid 11)  Lantus Solostar 100 Unit/ml Soln (Insulin glargine) .... 30 units q am and 45 units q pm 12)  Lyrica 150 Mg Caps (Pregabalin) .... Daily two times a day 13)  Cymbalta 60 Mg Cpep (Duloxetine hcl) .... Once daily 14)  Amitriptyline Hcl 25 Mg Tabs (Amitriptyline hcl) .... At bedtime 15)  Bd U/f Short Pen Needle 31g X 8 Mm Misc (Insulin pen needle) .... Use as directed 16)  Biotin  .Marland Kitchen.. 1 by mouth daily 17)  Tramadol Hcl 50 Mg Tabs (Tramadol hcl) .... As needed. 18)  Aspirin 81 Mg Tabs (Aspirin) .... 2 tabs daily 19)  Carvedilol 25 Mg Tabs (Carvedilol) .... One tab two times a day  Patient Instructions: 1)  Please schedule a follow-up appointment in 3 months .  2)  Check your blood sugars regularly. If your readings are usually above:  or below 70 you should contact our office.  3)  It is  important that your diabetic A1c level is checked every 3 months.  4)  See your eye doctor yearly to check for diabetic eye damage. 5)  Check your feet each night  for sore areas, calluses or signs of infection.  Prescriptions: CARVEDILOL 25 MG TABS (CARVEDILOL) one tab two times a day  #180 x 3   Entered and Authorized by:   Evelena Peat MD   Signed by:   Evelena Peat MD on 07/13/2009   Method used:   Faxed to ...       [facility] (mail-order)             , [place]         Ph: [phone]       Fax: [phone]   RxID:   [id number] AMITRIPTYLINE HCL 25 MG TABS (AMITRIPTYLINE HCL) at bedtime  #90 x 3   Entered and Authorized by:   Evelena Peat MD   Signed by:   Evelena Peat MD on 07/13/2009   Method used:   Faxed to ...       [facility] (mail-order)             , [place]         Ph: [phone]       Fax: [phone]   RxID:   [id number] CYMBALTA 60 MG CPEP (DULOXETINE HCL) once daily  #90 x 3   Entered and Authorized by:   Evelena Peat MD   Signed by:   Evelena Peat MD on 07/13/2009   Method used:   Faxed to ...       [facility] Environmental education officer)             ,  [place]         Ph: [phone]       Fax: [phone]   RxID:   [id number] LANTUS SOLOSTAR 100 UNIT/ML SOLN (INSULIN GLARGINE) 30 units q AM and 45 units q PM  #3 months x 3   Entered and Authorized by:   Evelena Peat MD   Signed by:   Evelena Peat MD on 07/13/2009   Method used:   Faxed to ...       [facility] (mail-order)             , [place]         Ph: [phone]       Fax: [phone]   RxID:   [id number] METFORMIN HCL 1000 MG TABS (METFORMIN HCL) one by mouth bid  #180 x 3   Entered and Authorized by:   Evelena Peat MD   Signed by:   Evelena Peat MD on 07/13/2009   Method used:   Faxed to ...       [facility] (mail-order)             , [place]         Ph: [phone]       Fax: [phone]   RxID:   [id number] BYETTA 10 MCG  PEN 10 MCG/0.04ML SOLN (EXENATIDE) once daily  #3 months x 3   Entered and Authorized by:   Evelena Peat MD   Signed by:   Evelena Peat MD on 07/13/2009   Method used:   Faxed to ...       [facility] (mail-order)             , [place]         Ph: [phone]       Fax: [phone]   RxID:   [id number] SIMVASTATIN 20 MG TABS (SIMVASTATIN) one tab at bedtime daily  #90 x 3   Entered and Authorized by:   Evelena Peat MD   Signed by:   Evelena Peat MD on 07/13/2009   Method used:   Faxed to ...       [facility] (mail-order)             , [place]         Ph: [phone]       Fax: [phone]   RxID:   [id number] BENICAR 20 MG TABS (OLMESARTAN MEDOXOMIL) once daily  #90 x 3   Entered and Authorized by:   Evelena Peat MD   Signed by:   Evelena Peat MD on 07/13/2009   Method used:   Faxed to ...       [facility] (mail-order)             , [place]         Ph: [phone]       Fax: [phone]   RxID:   [id number] CARDIZEM CD 120 MG XR24H-CAP (DILTIAZEM HCL COATED BEADS) once daily  #90 x 3   Entered and Authorized by:   Evelena Peat MD   Signed by:   Evelena Peat MD on 07/13/2009   Method used:   Faxed to ...       [facility] (mail-order)             , [place]         Ph: [phone]       Fax: [phone]   RxID:   [id number] SYNTHROID 50 MCG TABS (LEVOTHYROXINE SODIUM) qd  #  90 x 3   Entered and Authorized by:   Evelena Peat MD   Signed by:   Evelena Peat MD on 07/13/2009   Method used:   Faxed to ...       [facility] (mail-order)             , [place]         Ph: [phone]       Fax: [phone]   RxID:   [id number]

## 2010-06-09 NOTE — Assessment & Plan Note (Signed)
Summary: ACUTE CONGESTION/MHF   Vital Signs:  Patient profile:   76 year old female Height:      61 inches Weight:      150 pounds BMI:     28.44 O2 Sat:      93 % Temp:     98.0 degrees F oral Pulse rate:   103 / minute BP sitting:   160 / 90  (left arm) Cuff size:   regular  Vitals Entered By: Sid Falcon LPN (July 15, 2008 11:09 AM) Is Patient Diabetic? Yes  CBG Result 195   History of Present Illness: Patient is a 76 year old female who is his new patient establish care. Her chief complaint is persistent cough over a month duration. She has multiple chronic medical problems including history of asthma, type 2 diabetes, hypothyroidism,  hypertension and possible GERD.  She was admitted on 2-7- 2010 with shortness of breath, fever, and cough. Chest x-ray did not show any infiltrates. She was treated with prednisone, Avelox and addition of Singulair and the reinstitution of Advair. There is a comment in discharge summary that beta blockers may have exacerbated her asthma but she apparently has not been on beta blockers. For some reason, her Benicar was switched to lisinopril.  At this point she has persistent,  mostly dry cough which is both day and night. Nebulizers help her cough slightly. She does not recall having any improved cough when she was on the prednisone. She is taking Advair regularly. She denies any fever, pleuritic pain or hemoptysis. There is question of her having H1N1 virus during her hospitalization but she was not tested for this. She has occasional GERD symptoms but takes some acid suppressor regularly. Patient is nonsmoker.  Preventive Screening-Counseling & Management     Smoking Status: never  Allergies (verified): 1)  ! Doxycycline Hyclate (Doxycycline Hyclate) 2)  ! Avelox (Moxifloxacin Hcl)  Past Medical History:    Asthma    Diverticulosis, colon    GERD    Hypertension    Urinary incontinence    Diabetes    Chronic bronchitis    diabetic  peripheral neuropathy  Past Surgical History:    Appendectomy 1971    Hysterectomy  1971    Tonsillectomy  Family History:    Family History of Arthritis  patent    Family History Diabetes 1st degree relative  parent    Family History Lung cancer  grandparent    Family History of Stroke F 1st degree relative <60  grandparent    Family History of Cardiovascular disorder  parent  Social History:    Retired    Married    Never Smoked    Alcohol use-no    Smoking Status:  never  Review of Systems       The patient complains of dyspnea on exertion and prolonged cough.  The patient denies anorexia, fever, weight loss, weight gain, chest pain, syncope, peripheral edema, hemoptysis, and enlarged lymph nodes.    Physical Exam  General:  Patient is alert pleasant and in no distress at rest Head:  Normocephalic and atraumatic without obvious abnormalities. No apparent alopecia or balding. Ears:  External ear exam shows no significant lesions or deformities.  Otoscopic examination reveals clear canals, tympanic membranes are intact bilaterally without bulging, retraction, inflammation or discharge. Hearing is grossly normal bilaterally. Mouth:  Oral mucosa and oropharynx without lesions or exudates.  Teeth in good repair. Neck:  No deformities, masses, or tenderness noted. Lungs:  Slightly diminished breath  sounds throughout but no active wheezes or rales she has symmetric breath sounds. Heart:  Normal rate and regular rhythm. S1 and S2 normal without gallop, murmur, click, rub or other extra sounds. Extremities:  no edema or clubbing noted.   Impression & Recommendations:  Problem # 1:  COUGH (ICD-786.2) Patient presents with persistent cough of over a month duration. She initially presented with what in all likelihood was exacerbation of asthma. Question at this point is whether her persistent cough is related to recent initiation of ACE inhibitor. We'll discontinue her lisinopril and  switch back to Benicar 40 mg daily. Also get patient patient back on Singulair which she for some reason stopped she left the hospital. Bring back to reassess in 2 weeks.  Problem # 2:  ASTHMA (ICD-493.90) This would appear to be at least moderate persistent. Continue Advair and addition of Singulair as above. Her updated medication list for this problem includes:    Albuterol Sulfate (2.5 Mg/52ml) 0.083% Nebu (Albuterol sulfate) ..... Nebulizer inhalation every 6 hours as needed    Advair Diskus 250-50 Mcg/dose Misc (Fluticasone-salmeterol) ..... One puff bid    Singulair 10 Mg Tabs (Montelukast sodium) ..... Once daily  Problem # 3:  HYPERTENSION (ICD-401.9) Blood pressure somewhat elevated today. Switching from lisinopril to Benicar and reassess blood pressure in 2 weeks.+ Her updated medication list for this problem includes:    Cardizem Cd 240 Mg Xr24h-cap (Diltiazem hcl coated beads) ..... Once daily    Benicar 40 Mg Tabs (Olmesartan medoxomil) ..... One tab daily  Problem # 4:  DIABETES-TYPE 2 (ICD-250.00) Blood sugars have been somewhat elevated but home readings with recent fasting from 180. Some of this may be related to recent prednisone therapy. Followup in 2 weeks and get a repeat hemoglobin A1c and other screening labs that point. Her updated medication list for this problem includes:    Glipizide 5 Mg Tabs (Glipizide) ..... Once daily in am    Byetta 10 Mcg Pen 10 Mcg/0.24ml Soln (Exenatide) .Marland Kitchen..Marland Kitchen Two times a day    Novolog 100 Unit/ml Soln (Insulin aspart) ..... Before meals and at bedtime, sensitive scale    Benicar 40 Mg Tabs (Olmesartan medoxomil) ..... One tab daily  Complete Medication List: 1)  Albuterol Sulfate (2.5 Mg/47ml) 0.083% Nebu (Albuterol sulfate) .... Nebulizer inhalation every 6 hours as needed 2)  Atrovent 0.5 % Soln (ipratropium Bromide)  .... Nebulized inhalation every 6 hours as needed 3)  Advair Diskus 250-50 Mcg/dose Misc (Fluticasone-salmeterol) ....  One puff bid 4)  Singulair 10 Mg Tabs (Montelukast sodium) .... Once daily 5)  Synthroid 50 Mcg Tabs (Levothyroxine sodium) .... Qd 6)  Glipizide 5 Mg Tabs (Glipizide) .... Once daily in am 7)  Byetta 10 Mcg Pen 10 Mcg/0.52ml Soln (Exenatide) .... Two times a day 8)  Prilosec 20 Mg Cpdr (Omeprazole) .... Once daily 9)  [facility]vs Senna-c 8.6 Mg Tabs (Sennosides) .... 2 at bedtime 10)  Cardizem Cd 240 Mg Xr24h-cap (Diltiazem hcl coated beads) .... Once daily 11)  Novolog 100 Unit/ml Soln (Insulin aspart) .... Before meals and at bedtime, sensitive scale 12)  Benicar 40 Mg Tabs (Olmesartan medoxomil) .... One tab daily 13)  Lyrica 150 Mg Caps (Pregabalin) .... Daily two times a day 14)  Cymbalta 60 Mg Cpep (Duloxetine hcl) .... Once daily 15)  Amitriptyline Hcl 25 Mg Tabs (Amitriptyline hcl) .... At bedtime  Patient Instructions: 1)  Switch from lisinopril to Benicar.  Follow up promptly if you  have any fever or  increased shortness of breath or worsening cough. 2)  Please schedule a follow-up appointment in 2 weeks.  3)  The medication list was reviewed and reconciled.  All changed / newly prescribed medications were explained.  A complete medication list was provided to the patient / caregiver. 4)  The medication list was reviewed and reconciled.  All changed / newly prescribed medications were explained.  A complete medication list was provided to the patient / caregiver. Prescriptions: LYRICA 150 MG CAPS (PREGABALIN) daily two times a day  #60 x 5   Entered and Authorized by:   Evelena Peat MD   Signed by:   Evelena Peat MD on 07/15/2008   Method used:   Print then Give to Patient   RxID:   [id number] AMITRIPTYLINE HCL 25 MG TABS (AMITRIPTYLINE HCL) at bedtime  #30 x 6   Entered and Authorized by:   Evelena Peat MD   Signed by:   Evelena Peat MD on 07/15/2008   Method used:   Electronically to        [facility]  [place] [address] (retail)       [address]        [place], [place]  [address]       Ph: [phone] or [phone]       Fax: [phone]   RxID:   [id number] CYMBALTA 60 MG CPEP (DULOXETINE HCL) once daily  #30 x 6   Entered and Authorized by:   Evelena Peat MD   Signed by:   Evelena Peat MD on 07/15/2008   Method used:   Electronically to        [facility]  [place] [address] (retail)       [address]       [place], [place]  [address]       Ph: [phone] or [phone]       Fax: [phone]   RxID:   [id number] BENICAR 40 MG TABS (OLMESARTAN MEDOXOMIL) one tab daily  #30 x 6   Entered and Authorized by:   Evelena Peat MD   Signed by:   Evelena Peat MD on 07/15/2008   Method used:   Electronically to        [facility]  [place] [address] (retail)       [address]       [place], [place]  [address]       Ph: [phone] or [phone]       Fax: [phone]   RxID:   [id number] PRILOSEC 20 MG CPDR (OMEPRAZOLE) once daily  #30 x 6   Entered and Authorized by:   Evelena Peat MD   Signed by:   Evelena Peat MD on 07/15/2008   Method used:   Electronically to        [facility]  [place] [address] (retail)       [address]       [place], [place]  [address]       Ph: [phone] or [phone]       Fax: [phone]   RxID:   [id number] GLIPIZIDE 5 MG TABS (GLIPIZIDE) once daily in AM  #30 x 6   Entered and Authorized by:   Evelena Peat MD   Signed by:   Evelena Peat MD on 07/15/2008   Method used:   Electronically to        [facility]  [place] [phone][facility] (retail)  [address]       [place], [place]  [address]       Ph: [phone] or [phone]       Fax: [phone]   RxID:   [id number] SYNTHROID 50 MCG TABS (LEVOTHYROXINE SODIUM) qd  #30 x 6   Entered and Authorized by:   Evelena Peat MD   Signed by:   Evelena Peat MD on 07/15/2008   Method used:   Electronically to        [facility]  [place] [address] (retail)       [address]       [place], [place]  [address]        Ph: [phone] or [phone]       Fax: [phone]   RxID:   [id number]

## 2010-06-09 NOTE — Procedures (Signed)
Summary: Colonoscopy  Patient: Casey Harrell Note: All result statuses are Final unless otherwise noted.  Tests: (1) Colonoscopy (COL)   COL Colonoscopy           DONE     [facility]     [address] [place], [place]  [address]           COLONOSCOPY PROCEDURE REPORT           PATIENT:  Casey, Harrell  MR#:  [id number]     BIRTHDATE:  18-Mar-1935, 74 yrs. old  GENDER:  female     ENDOSCOPIST:  Judie Petit T. Russella Dar, MD, [facility]           PROCEDURE DATE:  01/17/2010     PROCEDURE:  Colonoscopy 19[address]     ASA CLASS:  Class II     INDICATIONS:  1) heme positive stool     MEDICATIONS:   Fentanyl 40 mcg IV, Versed 4 mg IV     DESCRIPTION OF PROCEDURE:   After the risks benefits and     alternatives of the procedure were thoroughly explained, informed     consent was obtained.  Digital rectal exam was performed and     revealed moderate external hemorrhoids.   The Pentax Ped Colon     [id number] endoscope was introduced through the anus and advanced to     the cecum, which was identified by both the appendix and ileocecal     valve, without limitations.  The quality of the prep was     excellent, using MoviPrep.  The instrument was then slowly     withdrawn as the colon was fully examined.     <<PROCEDUREIMAGES>>     FINDINGS:  Mild diverticulosis was found in the descending colon.     There was evidence of a prior segmental colectomy  in the sigmoid     colon.  A normal appearing cecum, ileocecal valve, and appendiceal     orifice were identified. The ascending, hepatic flexure,     transverse, splenic flexure, and rectum appeared unremarkable.     Retroflexed views in the rectum revealed no abnormalities. The     time to cecum =  3  minutes. The scope was then withdrawn (time =     10  min) from the patient and the procedure completed.           COMPLICATIONS:  None           ENDOSCOPIC IMPRESSION:     1) Mild diverticulosis in the descending colon     2) Prior segmental colectomy in  the sigmoid colon     3) External hemorrhoids           RECOMMENDATIONS:     1) High fiber diet with liberal fluid intake.     2) Given your age, we will not plan for a future colonoscopies     for colon cancer screening.           Venita Lick. Russella Dar, MD, [facility]           CC: Varney Baas, MD           n.     Rosalie DoctorVenita Lick. Stark at 01/17/2010 10:24 AM           [name], [id number]  Note: An exclamation mark (!) indicates a result that was not dispersed into the flowsheet. Document Creation Date: 01/17/2010 10:25 AM  _______________________________________________________________________  (1) Order result status: Final Collection or observation date-time: 01/17/2010 10:15 Requested date-time:  Receipt date-time:  Reported date-time:  Referring Physician:   Ordering Physician: Claudette Head [phone]) Specimen Source:  Source: Launa Grill Order Number: [id number] Lab site:

## 2010-06-09 NOTE — Letter (Signed)
Summary: New Patient letter  [facility]  [address] [place], [place] [address]   Phone: [phone]  Fax: [phone]       07/27/2009 MRN: [id number]  Casey Harrell [address] [place], [place]  [address]  Dear Casey Harrell,  Welcome to the Gastroenterology Division at [facility].    You are scheduled to see Dr. Russella Dar on 09/08/2009 at 10:00AM on the 3rd floor at [facility], [address]  We ask that you try to arrive at our office 15 minutes prior to your appointment time to allow for check-in.  We would like you to complete the enclosed self-administered evaluation form prior to your visit and bring it with you on the day of your appointment.  We will review it with you.  Also, please bring a complete list of all your medications or, if you prefer, bring the medication bottles and we will list them.  Please bring your insurance card so that we may make a copy of it.  If your insurance requires a referral to see a specialist, please bring your referral form from your primary care physician.  Co-payments are due at the time of your visit and may be paid by cash, check or credit card.     Your office visit will consist of a consult with your physician (includes a physical exam), any laboratory testing he/she may order, scheduling of any necessary diagnostic testing (e.g. x-ray, ultrasound, CT-scan), and scheduling of a procedure (e.g. Endoscopy, Colonoscopy) if required.  Please allow enough time on your schedule to allow for any/all of these possibilities.    If you cannot keep your appointment, please call [phone] to cancel or reschedule prior to your appointment date.  This allows [place] the opportunity to schedule an appointment for another patient in need of care.  If you do not cancel or reschedule by 5 p.m. the business day prior to your appointment date, you will be charged a $50.00 late cancellation/no-show fee.    Thank you for choosing [facility] for your medical needs.  We appreciate the opportunity to care for you.  Please visit [place] at our website  to learn more about our practice.                     Sincerely,                                                             The Gastroenterology Division

## 2010-06-09 NOTE — Progress Notes (Signed)
Summary: Amitriptyline refill request (not on active med list)  Phone Note Call from Patient Call back at [facility] Phone [phone]   Caller: Patient Call For: Casey Peat MD Summary of Call: Pt called requesting refill Amitriptyline 25mg .  Explained to pt this med was removed from her active med list in April.  Pt told me Dr Caryl Never took her off alot of meds after her hospitalization.  Pt had a bottle left and has been taking them because she feels it helps her feet.  I explained to pt Dr Caryl Never will be back on Monday 7/18.  Pt has 3 Amitriptyline left.  Pt has Cymbalta and that was refilled today. Initial call taken by: Sid Falcon LPN,  November 15, 2009 9:34 AM  Follow-up for Phone Call        refill amitriptyine 25 mg by mouth at bedtime.  She has a long hx of neuropathy pain and can remain on if this helps.  refill for 6 months. Follow-up by: Casey Peat MD,  November 21, 2009 1:51 PM  Additional Follow-up for Phone Call Additional follow up Details #1::        Pt informed Rx filled on home VM Additional Follow-up by: Sid Falcon LPN,  November 22, 2009 8:29 AM    New/Updated Medications: AMITRIPTYLINE HCL 25 MG TABS (AMITRIPTYLINE HCL) one tab at bedtime Prescriptions: AMITRIPTYLINE HCL 25 MG TABS (AMITRIPTYLINE HCL) one tab at bedtime  #30 x 6   Entered by:   Sid Falcon LPN   Authorized by:   Casey Peat MD   Signed by:   Sid Falcon LPN on 08/65/7846   Method used:   [facility]  [place] [address] [facility]       [address] [place] [address]       [place], [place]  [address]       Ph: [phone] or [phone]       Fax: [phone]   RxID:   [id number]

## 2010-06-09 NOTE — Progress Notes (Signed)
Summary: cough  Phone Note Call from Patient   Caller: Patient Call For: sood Summary of Call: pt's cough not any better Initial call taken by: Rickard Patience,  December 22, 2008 2:16 PM  Follow-up for Phone Call        pt stated that she has used all the meds that Dr. Craige Cotta has given her without any relief---she is still wheezing and coughing all day--she is using the symbicort and nebs and she has coughed so much she is sore.  please advise triage. Marijo File CMA  December 22, 2008 2:21 PM   Additional Follow-up for Phone Call Additional follow up Details #1::        This case will need an OV to sort out  Not telephone medicine. Additional Follow-up by: Storm Frisk MD,  December 22, 2008 2:23 PM    Additional Follow-up for Phone Call Additional follow up Details #2::    appt made with TP in the am at 11:45am.. pt is aware of appt. Marijo File CMA  December 22, 2008 2:26 PM

## 2010-06-09 NOTE — Progress Notes (Signed)
Summary: Cough/not better after seeing pulmonary  Phone Note Call from Patient   Summary of Call: Patient states she is not any better after seeing the pulmonary doctor. Patient is coughing and can hardly talk on the phone from coughing. She is requesting a call from Dr. Caryl Never. She states she needs something else for her cough. Patient advised to call the pulmonary doctor to see if they can call her something else in or if they need to see her. Patient can be reached at [phone]. Initial call taken by: Darra Lis RMA,  December 22, 2008 2:02 PM  Follow-up for Phone Call        spoke with patient.  She has f/u with pulmonary tomorrow.  No fever and no distress at rest.  She is using inhalers regularly. Follow-up by: Evelena Peat MD,  December 22, 2008 2:46 PM

## 2010-06-09 NOTE — Letter (Signed)
Summary: Diabetic Instructions  [facility]  [address] [place], [place] [address]   Phone: [phone]  Fax: [phone]    Casey Harrell 1934-12-19 MRN: [id number]   _ x _   ORAL DIABETIC MEDICATION INSTRUCTIONS  The day before your procedure:   Take your diabetic pill as you do normally  The day of your procedure:   Do not take your diabetic pill    We will check your blood sugar levels during the admission process and again in Recovery before discharging you home  ________________________________________________________________________  _ x _   INSULIN (LONG ACTING) MEDICATION INSTRUCTIONS (Lantus, NPH, 70/30, Humulin, Novolin-N)   The day before your procedure:   Take  your regular evening dose    The day of your procedure:   Do not take your morning dose    _  x_   INSULIN (SHORT ACTING) MEDICATION INSTRUCTIONS (Regular, Humulog, Novolog)   The day before your procedure:   Do not take your evening dose   The day of your procedure:   Do not take your morning dose   _  _   INSULIN PUMP MEDICATION INSTRUCTIONS  We will contact the physician managing your diabetic care for written dosage instructions for the day before your procedure and the day of your procedure.  Once we have received the instructions, we will contact you.

## 2010-06-09 NOTE — Procedures (Signed)
Summary: Colonoscopy/[facility]  Colonoscopy/[facility]   Imported By: Sherian Rein 09/08/2009 14:10:32  _____________________________________________________________________  External Attachment:    Type:   Image     Comment:   External Document

## 2010-06-09 NOTE — Assessment & Plan Note (Signed)
Summary: 4 week return/mhh   Primary Zamora Colton/Referring Cailie Bosshart:  Evelena Peat MD  CC:  Asthma and cough follow-up.   Pt says her cough is much better and her breathing is doing well.Casey Harrell  History of Present Illness: 76 yo female with cough, asthma, rhinitis, OSA, and GERD  Her cough is much improved.  She is sleeping better since her cough got better.  She occasionally will bring up clear sputum.  She is not having chest pain, wheeze, fever, or abdominal pain.  She is still using tramadol, tessalon, prilosec, and pepcid.  She is using her nebulizer two times a day.  She continues to use nasonex.      Current Medications (verified): 1)  Symbicort 160-4.5 Mcg/act Aero (Budesonide-Formoterol Fumarate) .... Two Puffs Two Times A Day 2)  Nasonex 50 Mcg/act Susp (Mometasone Furoate) .... Two Sprays Once Daily 1 3)  Singulair 10 Mg Tabs (Montelukast Sodium) .... Once Daily 4)  Loratadine 10 Mg Tabs (Loratadine) .... One By Mouth Once Daily As Needed 5)  Albuterol Sulfate (2.5 Mg/70ml) 0.083% Nebu (Albuterol Sulfate) .... Nebulizer Inhalation Every 6 Hours As Needed 6)  Atrovent 0.5 % Soln (Ipratropium Bromide) .... Nebulized Inhalation Every 6 Hours As Needed 7)  Synthroid 50 Mcg Tabs (Levothyroxine Sodium) .... Qd 8)  Cardizem Cd 240 Mg Xr24h-Cap (Diltiazem Hcl Coated Beads) .... Once Daily 9)  Benicar 40 Mg Tabs (Olmesartan Medoxomil) .... One Tab Daily 10)  Aspirin 81 Mg Tbec (Aspirin) .... Once Daily 11)  Simvastatin 20 Mg Tabs (Simvastatin) .... One Tab At Bedtime Daily 12)  Byetta 10 Mcg Pen 10 Mcg/0.13ml Soln (Exenatide) .... Two Times A Day 13)  Metformin Hcl 1000 Mg Tabs (Metformin Hcl) .... One By Mouth Bid 14)  Lantus Solostar 100 Unit/ml Soln (Insulin Glargine) .Casey Harrell.. 10 Units [place] Once Daily 15)  Prilosec 20 Mg Cpdr (Omeprazole) .... Once Daily 16)  Lyrica 150 Mg Caps (Pregabalin) .... Daily Two Times A Day 17)  Cymbalta 60 Mg Cpep (Duloxetine Hcl) .... Once Daily 18)   Amitriptyline Hcl 25 Mg Tabs (Amitriptyline Hcl) .... At Bedtime 19)  Bd U/f Short Pen Needle 31g X 8 Mm Misc (Insulin Pen Needle) .... Use As Directed 20)  Biotin .Casey Harrell.. 1 By Mouth Daily 21)  Tessalon 200 Mg Caps (Benzonatate) .Casey Harrell.. 1 By Mouth Three Times A Day 22)  Tramadol Hcl 50 Mg Tabs (Tramadol Hcl) .Casey Harrell.. 1 By Mouth Every 4 Hr As Needed.  Allergies (verified): 1)  ! Doxycycline Hyclate (Doxycycline Hyclate) 2)  ! Avelox (Moxifloxacin Hcl)  Past History:  Past Medical History: Reviewed history from 12/23/2008 and no changes required. Asthma      - Spirometry 11/25/08 FEV1 1.48(86%), FVC 1.85(75%), FEV1% 80      -cxr 7/21, nml.  Allergic rhinitis Diverticulosis, colon GERD Hypertension Urinary incontinence Diabetes Diabetic peripheral neuropathy OSA, intolerant of CPAP  Past Surgical History: Reviewed history from 11/25/2008 and no changes required. Appendectomy 1971 Hysterectomy  1971 Tonsillectomy Tubal ligation  Family History: Reviewed history from 07/15/2008 and no changes required. Family History of Arthritis  patent Family History Diabetes 1st degree relative  parent Family History Lung cancer  grandparent Family History of Stroke F 1st degree relative <60  grandparent Family History of Cardiovascular disorder  parent  Social History: Reviewed history from 07/15/2008 and no changes required. Retired Married Never Smoked Alcohol use-no  Vital Signs:  Patient profile:   76 year old female Height:      61 inches (154.94 cm) Weight:  154 pounds (70 kg) BMI:     29.20 O2 Sat:      94 % on Room air Temp:     98.1 degrees F (36.72 degrees C) oral Pulse rate:   77 / minute BP sitting:   120 / 72  (left arm) Cuff size:   regular  Vitals Entered By: Michel Bickers CMA (January 01, 2009 11:13 AM)  O2 Flow:  Room air  Physical Exam  General:  normal appearance and obese.   Nose:  no deformity, discharge, inflammation, or lesions Mouth:  no deformity or  lesions Neck:  no JVD, no thyromegaly Lungs:  prolonged exhalation, no wheezing or rales Heart:  regular rhythm, normal rate, no murmurs, and no rubs.   Abdomen:  bowel sounds positive; abdomen soft and non-tender without masses, or organomegaly Extremities:  no clubbing, cyanosis, edema, or deformity noted Cervical Nodes:  prominent cervical nodes, non-tender   Impression & Recommendations:  Problem # 1:  COUGH (ICD-786.2)  Much improved.  Will change tessalon and tramadol to as needed.   Problem # 2:  ASTHMA (ICD-493.90) Will continue her current inhaler and nebulizer regimen.  Problem # 3:  ALLERGIC RHINITIS (ICD-477.9)  She is to continue her sinus regimen.  Problem # 4:  OBSTRUCTIVE SLEEP APNEA (ICD-327.23) She is intolerant of CPAP.  Her sleep has improved with improvement in her cough.  Will continue clinical monitoring.  Problem # 5:  GERD (ICD-530.81) Will have her continue prilosec and pepcid for now.  Medications Added to Medication List This Visit: 1)  Pepcid 20 Mg Tabs (Famotidine) .... One by mouth two times a day 2)  Tessalon 200 Mg Caps (Benzonatate) .Casey Harrell.. 1 by mouth three times a day as needed  Complete Medication List: 1)  Symbicort 160-4.5 Mcg/act Aero (Budesonide-formoterol fumarate) .... Two puffs two times a day 2)  Singulair 10 Mg Tabs (Montelukast sodium) .... Once daily 3)  Albuterol Sulfate (2.5 Mg/25ml) 0.083% Nebu (Albuterol sulfate) .... Nebulizer inhalation every 6 hours as needed 4)  Atrovent 0.5 % Soln (ipratropium Bromide)  .... Nebulized inhalation every 6 hours as needed 5)  Nasonex 50 Mcg/act Susp (Mometasone furoate) .... Two sprays once daily 1 6)  Loratadine 10 Mg Tabs (Loratadine) .... One by mouth once daily as needed 7)  Synthroid 50 Mcg Tabs (Levothyroxine sodium) .... Qd 8)  Cardizem Cd 240 Mg Xr24h-cap (Diltiazem hcl coated beads) .... Once daily 9)  Benicar 40 Mg Tabs (Olmesartan medoxomil) .... One tab daily 10)  Aspirin 81 Mg  Tbec (Aspirin) .... Once daily 11)  Simvastatin 20 Mg Tabs (Simvastatin) .... One tab at bedtime daily 12)  Byetta 10 Mcg Pen 10 Mcg/0.70ml Soln (Exenatide) .... Two times a day 13)  Metformin Hcl 1000 Mg Tabs (Metformin hcl) .... One by mouth bid 14)  Lantus Solostar 100 Unit/ml Soln (Insulin glargine) .Casey Harrell.. 10 units [place] once daily 15)  Prilosec 20 Mg Cpdr (Omeprazole) .... Once daily 16)  Pepcid 20 Mg Tabs (Famotidine) .... One by mouth two times a day 17)  Lyrica 150 Mg Caps (Pregabalin) .... Daily two times a day 18)  Cymbalta 60 Mg Cpep (Duloxetine hcl) .... Once daily 19)  Amitriptyline Hcl 25 Mg Tabs (Amitriptyline hcl) .... At bedtime 20)  Bd U/f Short Pen Needle 31g X 8 Mm Misc (Insulin pen needle) .... Use as directed 21)  Biotin  .Casey Harrell.. 1 by mouth daily 22)  Tessalon 200 Mg Caps (Benzonatate) .Casey Harrell.. 1 by mouth three times a day  as needed 23)  Tramadol Hcl 50 Mg Tabs (Tramadol hcl) .Casey Harrell.. 1 by mouth every 4 hr as needed.  Other Orders: Est. Patient Level III ([address])  Patient Instructions: 1)  Use tessalon as needed first.  If ok, then try using tramadol as needed 2)  Follow up in 4 weeks

## 2010-06-09 NOTE — Progress Notes (Signed)
Summary: Med change from Benicar to Losartan  Phone Note From Pharmacy   Caller: [facility] Call For: Casey Harrell  Summary of Call: [facility] sent request to change Benicar 20mg  to Losartan.  Pt pt, OK to change med.  Per Dr Caryl Never, change to Losartan 50mg , #90 with RF 3.  Form faxed as requested, confirmation received Initial call taken by: Sid Falcon LPN,  July 28, 2009 4:59 PM    New/Updated Medications: LOSARTAN POTASSIUM 50 MG TABS (LOSARTAN POTASSIUM) once daily Prescriptions: LOSARTAN POTASSIUM 50 MG TABS (LOSARTAN POTASSIUM) once daily  #90 x 3   Entered by:   Sid Falcon LPN   Authorized by:   Evelena Peat MD   Signed by:   Sid Falcon LPN on 41/66/0630   Method used:   Historical   RxID:   [id number]

## 2010-06-09 NOTE — Procedures (Signed)
Summary: Dispensing optician   Imported By: Sherian Rein 09/08/2009 14:14:37  _____________________________________________________________________  External Attachment:    Type:   Image     Comment:   External Document

## 2010-06-09 NOTE — Progress Notes (Signed)
Summary: [employer] approved singulair till 10-22-2010  Phone Note Other Incoming   Caller: [employer] Summary of Call: [employer] approved singuliair till 10-22-2010 Initial call taken by: Roselle Locus,  October 23, 2008 7:46 AM

## 2010-06-09 NOTE — Progress Notes (Signed)
Summary: refill Metformin & Simvastatin X 1 year  Phone Note Refill Request Message from:  Fax from Pharmacy  Refills Requested: Medication #1:  SIMVASTATIN 20 MG TABS one tab at bedtime daily [facility] [phone]      fax---[phone]  Initial call taken by: Warnell Forester,  May 31, 2009 9:02 AM  Follow-up for Phone Call        Pt is due for 4 month OV.  Pt was called, appt scheduled 2/23, Simvastatin filled for one year.  Pt also told me she needed Metformin.  This was also filled for one year Follow-up by: Sid Falcon LPN,  May 31, 2009 12:33 PM    Prescriptions: METFORMIN HCL 1000 MG TABS (METFORMIN HCL) one by mouth bid  #60 x 11   Entered by:   Sid Falcon LPN   Authorized by:   Evelena Peat MD   Signed by:   Sid Falcon LPN on 13/12/6576   Method used:   [facility]        [facility]  [place] [address]       [address]       [place], [place]  [address]       Ph: [phone] or [phone]       Fax: [phone]   RxID:   [id number] SIMVASTATIN 20 MG TABS (SIMVASTATIN) one tab at bedtime daily  #30 x 11   Entered by:   Sid Falcon LPN   Authorized by:   Evelena Peat MD   Signed by:   Sid Falcon LPN on 41/66/0630   Method used:   [facility]        [facility]  [place] [address] [facility]       [address]       [place], [place]  [address]       Ph: [phone] or [phone]       Fax: [phone]   RxID:   [id number]

## 2010-06-09 NOTE — Assessment & Plan Note (Signed)
Summary: Gastroenterology  Casey Harrell MR#:  [id number] Page #  NAME:  Casey Harrell, Casey Harrell  OFFICE NO:  [id number]  DATE:  03/09/05  DOB:  [birth year]  HISTORY OF PRESENT ILLNESS:  Return office visit for gastroesophageal reflux disease and gastritis.  A hyperplastic sigmoid colon polyp was removed at colonoscopy.  Mild chronic gastritis was noted without evidence for Helicobacter pylori at endoscopy.  Her nausea, vomiting, reflux, and gastritis symptoms are well controlled on AcipHex 20 mg q. day along with standard antireflux measures.  She denies any difficulties with loose stools for the past several weeks.  She relates that her sister was recently diagnosed with celiac disease.  CURRENT MEDICATIONS:  Listed on the chart, updated, and reviewed.  MEDICATION ALLERGIES:  Doxycycline leading to nausea and vomiting.  EXAMINATION:  No acute distress.  Weight 146.6 pounds; blood pressure is 120/80; pulse 74 and regular.  Chest:  Clear to auscultation bilaterally.  Cardiac:  Regular rate and rhythm without murmurs.  Abdomen is soft and nontender with normoactive bowel sounds.  ASSESSMENT/PLAN: 1.  Gastroesophageal reflux disease and gastritis.  Maintain a proton-pump inhibitor q. day to b.i.d. along with standard antireflux measures and bed blocks for long-term management of gastroesophageal reflux disease. 2.  Intermittent urgent diarrhea.  Recently no symptoms.  Will obtain serologic studies for celiac disease.  Further plans pending results of her celiac serologies.     Venita Lick. Russella Dar, M.D., F.A.C.G.  GLO/VFI433 cc:  Rema Fendt, NP D:  03/09/05; T:  ; Job [phone]

## 2010-06-09 NOTE — Procedures (Signed)
Summary: Colonoscopy and biopsy   Colonoscopy  Procedure date:  01/31/2005  Findings:      Results: Hemorrhoids.     Results: Diverticulosis.       Pathology:  Hyperplastic polyp.     Location:  [facility].    Procedures Next Due Date:    Colonoscopy: 02/2010  Colonoscopy  Procedure date:  01/31/2005  Findings:      Results: Hemorrhoids.     Results: Diverticulosis.       Pathology:  Hyperplastic polyp.     Location:   [facility].    Procedures Next Due Date:    Colonoscopy: 02/2010 Patient Name: Casey Harrell MRN:  Procedure Procedures: Colonoscopy CPT: 29528.    with polypectomy. CPT: A3573898.  Personnel: Endoscopist: Venita Lick. Russella Dar, MD, Clementeen Graham.  Exam Location: Exam performed in Outpatient Clinic. Outpatient  Patient Consent: Procedure, Alternatives, Risks and Benefits discussed, consent obtained, from patient. Consent was obtained by the RN.  Indications Symptoms: Diarrhea  History  Current Medications: Patient is not currently taking Coumadin.  Pre-Exam Physical: Performed Jan 31, 2005. Cardio-pulmonary exam WNL. Rectal exam abnormal. HEENT exam , Abdominal exam, Mental status exam WNL. Abnormal PE findings include: ext. hem.  Exam Exam: Extent of exam reached: Terminal Ileum, extent intended: Terminal Ileum.  The cecum was identified by appendiceal orifice and IC valve. Colon retroflexion performed. ASA Classification: II. Tolerance: excellent.  Monitoring: Pulse and BP monitoring, Oximetry used. Supplemental O2 given.  Colon Prep Used Glycolax for colon prep. Prep results: fair, adequate exam.  Sedation Meds: Patient assessed and found to be appropriate for moderate (conscious) sedation. Fentanyl 75 mcg. given IV. Versed 7 mg. given IV.  Findings DIVERTICULOSIS: Descending Colon. Not bleeding. ICD9: Diverticulosis: 562.10. Comments: mild.  POLYP: Descending Colon, Maximum size: 4 mm. sessile polyp. Procedure:  snare  without cautery, removed, retrieved, Polyp sent to pathology. ICD9: Colon Polyps: 211.3.  NORMAL EXAM: Terminal Ileum to Splenic Flexure.  - PRIOR SURGERY: Sigmoid Colon. Segmental Colectomy. Anastamosis present.  HEMORRHOIDS: Internal. Size: Small. Not bleeding. Not thrombosed. ICD9: Hemorrhoids, Internal: 455.0.   Assessment  Diagnoses: 211.3: Colon Polyps.  455.6: Hemorrhoids, Internal and  External.  562.10: Diverticulosis.   Events  Unplanned Interventions: No intervention was required.  Unplanned Events: There were no complications. Plans  Post Exam Instructions: Post sedation instructions given. No aspirin or non-steroidal containing medications: 2 weeks.  Medication Plan: Await pathology.  Patient Education: Patient given standard instructions for: Polyps. Diverticulosis. Hemorrhoids.  Disposition: After procedure patient sent to recovery. After recovery patient sent home.  Scheduling/Referral: Colonoscopy, to [facility] T. Russella Dar, MD, [facility], if polyp is adenomatous, around Jan 31, 2010.    This report was created from the original endoscopy report, which was reviewed and signed by the above listed endoscopist.    cc: Rema Fendt, MD          [facility] Surgical Pathology - STATUS: Final             By: Threasa Beards  ,        Perform Date: 36Sep06 00:01  Ordered By: Rica Records Date: 26Sep06 14:38  Facility: [facility]                               Department: CPATH  Service Report Text  [facility]   [id number]  [place], [place] [address]   Telephone [phone] or [phone] Fax [phone]    REPORT OF SURGICAL PATHOLOGY    Case #: [id number]   Patient Name: Casey Harrell, Casey Harrell   PID: [id number]   Pathologist: Havery Moros, MD   DOB/Age [birth year] (Age: 76) Gender: F   Date Taken: 01/31/2005   Date Received: 01/31/2005    FINAL DIAGNOSIS    ***MICROSCOPIC EXAMINATION AND DIAGNOSIS***     1. SIGMOID COLON, POLYP: HYPERPLASTIC POLYP. NO ADENOMATOUS   CHANGE OR MALIGNANCY IDENTIFIED.    2. STOMACH, ANTRUM, BIOPSY:   - BENIGN GASTRIC MUCOSA WITH MILD CHRONIC GASTRITIS AND   REACTIVE EPITHELIAL   CHANGES.   - FOCAL INTESTINAL METAPLASIA.   - SEE COMMENT.    3. STOMACH, BODY, NODULE: BENIGN HYPERPLASTIC POLYP.    COMMENT   2. The changes are most compatible with reactive gastropathy   such as seen with alcohol, NSAIDS or reflux. A Warthin-Starry   stain is performed to determine the possibility of the presence   of Helicobacter pylori. The Warthin-Starry stain is negative for   organisms of Helicobacter pylori. The control(s) stained   appropriately.    3. A Warthin-Starry stain is performed to determine the   possibility of the presence of Helicobacter pylori. The   Warthin-Starry stain is negative for organisms of Helicobacter   pylori. The control(s) stained appropriately. (BNS:caf:jy   02/01/05)    cf   Date Reported: 02/01/2005 Havery Moros, MD   *** Electronically Signed Out By BNS ***    Clinical information   1. R/O adenomatous 2. R/O gastritis   Reflux, diarrhea, nausea and vomiting (mw)    specimen(s) obtained   1: Colon, polyp(s), sigmoid   2: Stomach, antrum, biopsy   3: Stomach, body, nodule, biopsy    Gross Description   1. Received in formalin is a tan, soft tissue fragment that is   submitted in toto. Size: 0.4 cm.    2. Received in formalin are tan, soft tissue fragments that are   submitted in toto. Number: multiple   Size: 0.1 cm up to 0.3 cm.    3. Received in formalin is a tan, soft tissue fragment that is   submitted in toto. Size: 0.4 cm (SP:jy) 01/31/05    jy/

## 2010-06-09 NOTE — Progress Notes (Signed)
Summary: wants cough med  Phone Note Call from Patient Call back at Home Phone [phone]   Caller: Patient Call For: Evelena Peat MD Summary of Call: was seen last week and now she is coughing and wants a cough med called into [facility] [place] Initial call taken by: Alfred Levins, CMA,  July 20, 2008 10:05 AM  Follow-up for Phone Call        may call in Tessalon Perles 200mg  one by mouth q 8 hour as needed, 30 tabs/no refill.  Patient needs f/u by next week to reassess. Follow-up by: Evelena Peat MD,  July 20, 2008 1:05 PM      Appended Document: wants cough med Rx sent electronically, pt informed Sid Falcon LPN  July 20, 2008 4:30 PM    Prescriptions: TESSALON 200 MG CAPS (BENZONATATE) one every 8 hours as needed for cough  #30 x 0   Entered by:   Sid Falcon LPN   Authorized by:   Evelena Peat MD   Signed by:   Sid Falcon LPN on 09/81/1914   Method used:   [facility]        [facility]  [place] [address] [facility]retail[facility]       [address]       [place], [place]  [address]       Ph: [phone] or [phone]       Fax: [phone]   RxID:   [id number]

## 2010-06-09 NOTE — Assessment & Plan Note (Signed)
Summary: cough//lch   Vital Signs:  Patient profile:   76 year old female Weight:      155 pounds Temp:     97.5 degrees F oral BP sitting:   150 / 92  (left arm) Cuff size:   regular  Vitals Entered By: Sid Falcon LPN (February 16, 2010 8:24 AM)  Serial Vital Signs/Assessments:  Time      Position  BP       Pulse  Resp  Temp     By                     120/90                         Sid Falcon LPN   History of Present Illness: Here with cough for 3 weeks which is mostly dry.  No fever and no dyspnea. Nonsmoker.  Reported hx asthma but not on any inhalers.  Tried Delsym and Robitussin without relief.  Worse at night.  No pleuritic pain or hempotysis.  No PND.  No allergy symptoms. Has had flu vaccine.  Diabetes improved.  Has titrated insulin to 128 units daily.  Allergies: 1)  ! Doxycycline Hyclate (Doxycycline Hyclate) 2)  ! Avelox (Moxifloxacin Hcl) 3)  Morphine Sulfate (Morphine Sulfate)  Past History:  Past Medical History: Last updated: 09/07/2009 Asthma      - Spirometry 11/25/08 FEV1 1.48(86%), FVC 1.85(75%), FEV1% 80      - IgE 01/29/09 was 86.5      - CXR 7/21, normal Allergic rhinitis Diverticulosis, colon GERD Hypertension Urinary incontinence Diabetes Diabetic peripheral neuropathy OSA, intolerant of CPAP Hypothyroidism Diverticulosis Hemorrhoids Hyperplastic polyps 01/2005  Past Surgical History: Last updated: 01/12/2010 Appendectomy 1971 Hysterectomy  1971 Tonsillectomy Tubal ligation C-section Sigmoid colectomy for diverticulitis Laparotomy x 3 with adhesions lysis Right foot surgery x 2  Family History: Last updated: 09/07/2009 Family History of Arthritis  patent Family History Diabetes 1st degree relative  parent Family History Lung cancer  grandparent Family History of Stroke F 1st degree relative <60  grandparent Family History of Cardiovascular disorder  parent Family History of Celiac Disease:Sister  Social  History: Last updated: 07/15/2008 Retired Married Never Smoked Alcohol use-no  Risk Factors: Exercise: no (07/13/2009)  Risk Factors: Smoking Status: never (07/15/2008)  Review of Systems  The patient denies anorexia, fever, weight loss, peripheral edema, prolonged cough, and hemoptysis.    Physical Exam  General:  Well-developed,well-nourished,in no acute distress; alert,appropriate and cooperative throughout examination Ears:  External ear exam shows no significant lesions or deformities.  Otoscopic examination reveals clear canals, tympanic membranes are intact bilaterally without bulging, retraction, inflammation or discharge. Hearing is grossly normal bilaterally. Nose:  External nasal examination shows no deformity or inflammation. Nasal mucosa are pink and moist without lesions or exudates. Mouth:  Oral mucosa and oropharynx without lesions or exudates.  Teeth in good repair. Neck:  No deformities, masses, or tenderness noted. Lungs:  Normal respiratory effort, chest expands symmetrically. Lungs are clear to auscultation, no crackles or wheezes. Heart:  Normal rate and regular rhythm. S1 and S2 normal without gallop, murmur, click, rub or other extra sounds. Extremities:  no edema.   Impression & Recommendations:  Problem # 1:  COUGH (ICD-786.2) probably viral.  Cough suppressant prescribed  Problem # 2:  DIABETES-TYPE 2 (ICD-250.00) improved per pt with adjustments in insulin. Her updated medication list for this problem includes:    Benicar 20 Mg  Tabs (Olmesartan medoxomil) ..... One by mouth daily    Aspirin 81 Mg Tbec (Aspirin) ..... Once daily    Metformin Hcl 1000 Mg Tabs (Metformin hcl) ..... One by mouth bid    Lantus Solostar 100 Unit/ml Soln (Insulin glargine) .Marland Kitchen... 90 units daily    Novolog Flexpen 100 Unit/ml Soln (Insulin aspart) .Marland Kitchen... 9 units with breakfast and supper, 5 units at lunchtime  Complete Medication List: 1)  Synthroid 50 Mcg Tabs  (Levothyroxine sodium) .... Once daily 2)  Cardizem Cd 120 Mg Xr24h-cap (Diltiazem hcl coated beads) .... Once daily 3)  Benicar 20 Mg Tabs (Olmesartan medoxomil) .... One by mouth daily 4)  Aspirin 81 Mg Tbec (Aspirin) .... Once daily 5)  Simvastatin 20 Mg Tabs (Simvastatin) .... One tab at bedtime daily 6)  Metformin Hcl 1000 Mg Tabs (Metformin hcl) .... One by mouth bid 7)  Lantus Solostar 100 Unit/ml Soln (Insulin glargine) .... 90 units daily 8)  Cymbalta 60 Mg Cpep (Duloxetine hcl) .... Once daily 9)  Bd U/f Short Pen Needle 31g X 8 Mm Misc (Insulin pen needle) .... Use as directed 10)  Biotin  .Marland Kitchen.. 1 by mouth daily 11)  Carvedilol 25 Mg Tabs (Carvedilol) .... One tab two times a day 12)  Nexium 40 Mg Cpdr (Esomeprazole magnesium) .... One by mouth once daily 13)  Novolog Flexpen 100 Unit/ml Soln (Insulin aspart) .... 9 units with breakfast and supper, 5 units at lunchtime 14)  Amitriptyline Hcl 25 Mg Tabs (Amitriptyline hcl) .... One tab at bedtime 15)  Niaspan 500 Mg Cr-tabs (Niacin (antihyperlipidemic)) .... One by mouth once daily 16)  Align Caps (Probiotic product) .... One capsule by mouth once daily 17)  Hydrocodone-homatropine 5-1.5 Mg/29ml Syrp (Hydrocodone-homatropine) .... One tsp by mouth q 4-6 hours as needed cough  Patient Instructions: 1)  Make sure you have follow up appt in 2 months to reassess diabetes Prescriptions: HYDROCODONE-HOMATROPINE 5-1.5 MG/5ML SYRP (HYDROCODONE-HOMATROPINE) one tsp by mouth q 4-6 hours as needed cough  #120 ml x 0   Entered and Authorized by:   Evelena Peat MD   Signed by:   Evelena Peat MD on 02/16/2010   Method used:   Print then Give to Patient   RxID:   [id number]

## 2010-06-09 NOTE — Letter (Signed)
Summary: Gentry Roch, M.D. (ENT)  Gentry Roch, M.D. (ENT)   Imported By: Sherian Rein 03/30/2009 14:33:10  _____________________________________________________________________  External Attachment:    Type:   Image     Comment:   External Document

## 2010-06-09 NOTE — Assessment & Plan Note (Signed)
Summary: 2 week fup//ccm   Vital Signs:  Patient profile:   76 year old female Weight:      147 pounds Temp:     98 degrees F oral BP sitting:   110 / 60  (left arm) Cuff size:   regular  Vitals Entered By: Sid Falcon LPN (Sep 16, 2009 1:32 PM) CC: 2 week follow-up, Hypertension Management Is Patient Diabetic? Yes Did you bring your meter with you today? Yes   History of Present Illness: Followup multiple items as below.  History hypertension with poor control last visit. Compliant with medications. Blood pressure improved at this time. No dizziness or chest pains. No side effects from medication.  Recent diverticulitis. Symptoms resolved- now off antibiotics. No recurrent pain or fever.  Type 2 diabetes insulin-dependent. Blood sugars are improving. Fastings mostly 130-145. No hypoglycemic symptoms. Currently Lantus 15 units daily with NovoLog pre-meals.    New problem of generalized pruritus with occasional erythematous rash on extremities. No recent change in soaps or detergents. Generally dry skin.  Tried couple of moisturizers without much improvement.  Hypertension History:      She denies headache, chest pain, palpitations, orthopnea, peripheral edema, and syncope.        Positive major cardiovascular risk factors include female age 72 years old or older, diabetes, hyperlipidemia, and hypertension.  Negative major cardiovascular risk factors include non-tobacco-user status.     Allergies: 1)  ! Doxycycline Hyclate (Doxycycline Hyclate) 2)  ! Avelox (Moxifloxacin Hcl) 3)  Morphine Sulfate (Morphine Sulfate)  Past History:  Past Medical History: Last updated: 09/07/2009 Asthma      - Spirometry 11/25/08 FEV1 1.48(86%), FVC 1.85(75%), FEV1% 80      - IgE 01/29/09 was 86.5      - CXR 7/21, normal Allergic rhinitis Diverticulosis, colon GERD Hypertension Urinary incontinence Diabetes Diabetic peripheral neuropathy OSA, intolerant of  CPAP Hypothyroidism Diverticulosis Hemorrhoids Hyperplastic polyps 01/2005  Past Surgical History: Last updated: 09/07/2009 Appendectomy 1971 Hysterectomy  1971 Tonsillectomy Tubal ligation C-section Sigmoid colectomy Laparotomy x 3 with adhesions lysis Right foot surgery x 2  Family History: Last updated: 09/07/2009 Family History of Arthritis  patent Family History Diabetes 1st degree relative  parent Family History Lung cancer  grandparent Family History of Stroke F 1st degree relative <60  grandparent Family History of Cardiovascular disorder  parent Family History of Celiac Disease:Sister  Social History: Last updated: 07/15/2008 Retired Married Never Smoked Alcohol use-no  Risk Factors: Exercise: no (07/13/2009)  Risk Factors: Smoking Status: never (07/15/2008)  Review of Systems  The patient denies anorexia, fever, weight loss, hoarseness, chest pain, syncope, dyspnea on exertion, peripheral edema, headaches, abdominal pain, melena, hematochezia, and severe indigestion/heartburn.    Physical Exam  General:  Well-developed,well-nourished,in no acute distress; alert,appropriate and cooperative throughout examination Head:  Normocephalic and atraumatic without obvious abnormalities. No apparent alopecia or balding. Eyes:  pupils equal, pupils round, and pupils reactive to light.   Mouth:  Oral mucosa and oropharynx without lesions or exudates.  Teeth in good repair. Neck:  No deformities, masses, or tenderness noted. Lungs:  normal respiratory effort, normal breath sounds, no crackles, and no wheezes.   Heart:  normal rate and regular rhythm.   Extremities:  no edema, no foot lesions. Skin:  generally dry skin. A few excoriations upper extremities. No vesicles or pustules.   Impression & Recommendations:  Problem # 1:  DIABETES-TYPE 2 (ICD-250.00) Assessment Improved  The following medications were removed from the medication list:  Aspirin 81 Mg  Tabs (Aspirin) .Marland Kitchen... 2 tabs daily Her updated medication list for this problem includes:    Benicar 20 Mg Tabs (Olmesartan medoxomil) ..... One by mouth daily    Aspirin 81 Mg Tbec (Aspirin) ..... Once daily    Metformin Hcl 1000 Mg Tabs (Metformin hcl) ..... One by mouth bid    Lantus Solostar 100 Unit/ml Soln (Insulin glargine) .Marland KitchenMarland KitchenMarland KitchenMarland Kitchen 15 units daily.    Novolog Flexpen 100 Unit/ml Soln (Insulin aspart) .Marland KitchenMarland KitchenMarland KitchenMarland Kitchen 5 units with breakfast and supper  Problem # 2:  HYPERTENSION (ICD-401.9) Assessment: Improved  Her updated medication list for this problem includes:    Cardizem Cd 120 Mg Xr24h-cap (Diltiazem hcl coated beads) ..... Once daily    Benicar 20 Mg Tabs (Olmesartan medoxomil) ..... One by mouth daily    Carvedilol 25 Mg Tabs (Carvedilol) ..... One tab two times a day  Problem # 3:  PRURITUS (ICD-698.9) Assessment: New probably secondary to dry skin. Use Eucerin with triamcinolone 50-50 mixture  Problem # 4:  DIVERTICULITIS, ACUTE (ICD-562.11) Assessment: Improved  Complete Medication List: 1)  Atrovent 0.5 % Soln (ipratropium Bromide)  .... Nebulized inhalation every 6 hours as needed 2)  Synthroid 50 Mcg Tabs (Levothyroxine sodium) .... Qd 3)  Cardizem Cd 120 Mg Xr24h-cap (Diltiazem hcl coated beads) .... Once daily 4)  Benicar 20 Mg Tabs (Olmesartan medoxomil) .... One by mouth daily 5)  Aspirin 81 Mg Tbec (Aspirin) .... Once daily 6)  Simvastatin 20 Mg Tabs (Simvastatin) .... One tab at bedtime daily 7)  Metformin Hcl 1000 Mg Tabs (Metformin hcl) .... One by mouth bid 8)  Lantus Solostar 100 Unit/ml Soln (Insulin glargine) .Marland Kitchen.. 15 units daily. 9)  Cymbalta 60 Mg Cpep (Duloxetine hcl) .... Once daily 10)  Bd U/f Short Pen Needle 31g X 8 Mm Misc (Insulin pen needle) .... Use as directed 11)  Biotin  .Marland Kitchen.. 1 by mouth daily 12)  Carvedilol 25 Mg Tabs (Carvedilol) .... One tab two times a day 13)  Nexium 40 Mg Cpdr (Esomeprazole magnesium) .... One by mouth once daily 14)  Novolog  Flexpen 100 Unit/ml Soln (Insulin aspart) .... 5 units with breakfast and supper  Hypertension Assessment/Plan:      The patient's hypertensive risk group is category C: Target organ damage and/or diabetes.  Today's blood pressure is 110/60.    Patient Instructions: 1)  Please schedule a follow-up appointment in 3 months .  2)  Check your blood sugars regularly. If your readings are usually above: 180 or below 70 you should contact our office.  3)  It is important that your diabetic A1c level is checked every 3 months.  4)  See your eye doctor yearly to check for diabetic eye damage. 5)  Check your feet each night  for sore areas, calluses or signs of infection.

## 2010-06-09 NOTE — Assessment & Plan Note (Signed)
Summary: severe cough/lw   Primary Provider/Referring Provider:  Evelena Peat MD  CC:  increased SOB, wheezing, and cough occasionally producing yellow/green mucus x3-4weeks.  History of Present Illness: 76 yo female with hx of  asthma and cough. Seen for initial consult 11/25/08   11/25/08-Initial ov.  She was hospitalized in February for asthma and bronchitis.  She has a cough with with yellow phlegm in the morning.   She has seasonal allergies, and says Spring is the worst season for her.  She had allergy tests years ago.  She was told she was allergic to cats, dogs, and trees.  She was previously on allergy shots with Dr. Sidney Ace.  She gets hives from bee stings.  She denies any skin rashes or eczema.She was on an ACE inhibitor.  This was stopped, but she continued to have problems with her cough.  She has been using her nebulizer once daily.  Chest xray from June 14, 2008 showed chronic interstitial changes.  Chest xray today was unremarkable. V/Q scan from June 15, 2008 was low probability. Spirometry today was normal.  8/18/10_ Presents for an acute office visit. Complains of increased SOB, wheezing, cough occasionally producing yellow/green mucus. Returns for persistent cough, last visit. changed from advair to symbicort , tx w/ steroid taper and zpack along clartin. No better, cough improved slight for couple of days, never went away and worse last week. Denies chest pain, dyspnea, orthopnea, hemoptysis, fever, n/v/d, edema, headache. cough throughout day and night.   Medications Prior to Update: 1)  Symbicort 160-4.5 Mcg/act Aero (Budesonide-Formoterol Fumarate) .... Two Puffs Two Times A Day 2)  Nasonex 50 Mcg/act Susp (Mometasone Furoate) .... Two Sprays Once Daily 1 3)  Singulair 10 Mg Tabs (Montelukast Sodium) .... Once Daily 4)  Loratadine 10 Mg Tabs (Loratadine) .... One By Mouth Once Daily As Needed 5)  Albuterol Sulfate (2.5 Mg/14ml) 0.083% Nebu (Albuterol Sulfate)  .... Nebulizer Inhalation Every 6 Hours As Needed 6)  Atrovent 0.5 % Soln (Ipratropium Bromide) .... Nebulized Inhalation Every 6 Hours As Needed 7)  Synthroid 50 Mcg Tabs (Levothyroxine Sodium) .... Qd 8)  Cardizem Cd 240 Mg Xr24h-Cap (Diltiazem Hcl Coated Beads) .... Once Daily 9)  Benicar 40 Mg Tabs (Olmesartan Medoxomil) .... One Tab Daily 10)  Aspirin 81 Mg Tbec (Aspirin) .... Once Daily 11)  Simvastatin 20 Mg Tabs (Simvastatin) .... One Tab At Bedtime Daily 12)  Byetta 10 Mcg Pen 10 Mcg/0.2ml Soln (Exenatide) .... Two Times A Day 13)  Metformin Hcl 1000 Mg Tabs (Metformin Hcl) .... One By Mouth Bid 14)  Lantus Solostar 100 Unit/ml Soln (Insulin Glargine) .Marland Kitchen.. 10 Units [place] Once Daily 15)  Prilosec 20 Mg Cpdr (Omeprazole) .... Once Daily 16)  Lyrica 150 Mg Caps (Pregabalin) .... Daily Two Times A Day 17)  Cymbalta 60 Mg Cpep (Duloxetine Hcl) .... Once Daily 18)  Amitriptyline Hcl 25 Mg Tabs (Amitriptyline Hcl) .... At Bedtime 19)  Bd U/f Short Pen Needle 31g X 8 Mm Misc (Insulin Pen Needle) .... Use As Directed 20)  Ra Fish Oil 1000 Mg Caps (Omega-3 Fatty Acids) .Marland Kitchen.. 1 By Mouth Daily 21)  Vitamin C 500 Mg Tabs (Ascorbic Acid) .Marland Kitchen.. 1 By Mouth Daily 22)  Vitamin B-12 500 Mcg Tabs (Cyanocobalamin) .Marland Kitchen.. 1 By Mouth Daily 23)  Vitamin D 1000 Unit Caps (Cholecalciferol) .Marland Kitchen.. 1 By Mouth Daily 24)  Vitamin E 200 Unit Caps (Vitamin E) .Marland Kitchen.. 1 By Mouth Daily 25)  Biotin .Marland Kitchen.. 1 By Mouth Daily 26)  Zithromax Z-Pak 250 Mg Tabs (Azithromycin) .... Use As Directed 27)  Prednisone 10 Mg Tabs (Prednisone) .... 3 Pills For 2 Days, 2 Pills For 2 Days, 1 Pill For 2 Days, 1/2 Pill For 2 Days  Current Medications (verified): 1)  Symbicort 160-4.5 Mcg/act Aero (Budesonide-Formoterol Fumarate) .... Two Puffs Two Times A Day 2)  Nasonex 50 Mcg/act Susp (Mometasone Furoate) .... Two Sprays Once Daily 1 3)  Singulair 10 Mg Tabs (Montelukast Sodium) .... Once Daily 4)  Loratadine 10 Mg Tabs (Loratadine) .... One  By Mouth Once Daily As Needed 5)  Albuterol Sulfate (2.5 Mg/56ml) 0.083% Nebu (Albuterol Sulfate) .... Nebulizer Inhalation Every 6 Hours As Needed 6)  Atrovent 0.5 % Soln (Ipratropium Bromide) .... Nebulized Inhalation Every 6 Hours As Needed 7)  Synthroid 50 Mcg Tabs (Levothyroxine Sodium) .... Qd 8)  Cardizem Cd 240 Mg Xr24h-Cap (Diltiazem Hcl Coated Beads) .... Once Daily 9)  Benicar 40 Mg Tabs (Olmesartan Medoxomil) .... One Tab Daily 10)  Aspirin 81 Mg Tbec (Aspirin) .... Once Daily 11)  Simvastatin 20 Mg Tabs (Simvastatin) .... One Tab At Bedtime Daily 12)  Byetta 10 Mcg Pen 10 Mcg/0.67ml Soln (Exenatide) .... Two Times A Day 13)  Metformin Hcl 1000 Mg Tabs (Metformin Hcl) .... One By Mouth Bid 14)  Lantus Solostar 100 Unit/ml Soln (Insulin Glargine) .Marland Kitchen.. 10 Units [place] Once Daily 15)  Prilosec 20 Mg Cpdr (Omeprazole) .... Once Daily 16)  Lyrica 150 Mg Caps (Pregabalin) .... Daily Two Times A Day 17)  Cymbalta 60 Mg Cpep (Duloxetine Hcl) .... Once Daily 18)  Amitriptyline Hcl 25 Mg Tabs (Amitriptyline Hcl) .... At Bedtime 19)  Bd U/f Short Pen Needle 31g X 8 Mm Misc (Insulin Pen Needle) .... Use As Directed 20)  Ra Fish Oil 1000 Mg Caps (Omega-3 Fatty Acids) .Marland Kitchen.. 1 By Mouth Daily 21)  Vitamin C 500 Mg Tabs (Ascorbic Acid) .Marland Kitchen.. 1 By Mouth Daily 22)  Vitamin B-12 500 Mcg Tabs (Cyanocobalamin) .Marland Kitchen.. 1 By Mouth Daily 23)  Vitamin D 1000 Unit Caps (Cholecalciferol) .Marland Kitchen.. 1 By Mouth Daily 24)  Vitamin E 200 Unit Caps (Vitamin E) .Marland Kitchen.. 1 By Mouth Daily 25)  Biotin .Marland Kitchen.. 1 By Mouth Daily  Allergies (verified): 1)  ! Doxycycline Hyclate (Doxycycline Hyclate) 2)  ! Avelox (Moxifloxacin Hcl)  Past History:  Past Surgical History: Last updated: 11/25/2008 Appendectomy 1971 Hysterectomy  1971 Tonsillectomy Tubal ligation  Family History: Last updated: 07/15/2008 Family History of Arthritis  patent Family History Diabetes 1st degree relative  parent Family History Lung cancer   grandparent Family History of Stroke F 1st degree relative <60  grandparent Family History of Cardiovascular disorder  parent  Social History: Last updated: 07/15/2008 Retired Married Never Smoked Alcohol use-no  Risk Factors: Smoking Status: never (07/15/2008)  Past Medical History: Asthma      - Spirometry 11/25/08 FEV1 1.48(86%), FVC 1.85(75%), FEV1% 80      -cxr 7/21, nml.  Allergic rhinitis Diverticulosis, colon GERD Hypertension Urinary incontinence Diabetes Diabetic peripheral neuropathy OSA, intolerant of CPAP  Review of Systems      See HPI  Vital Signs:  Patient profile:   76 year old female Height:      61 inches Weight:      152 pounds BMI:     28.82 O2 Sat:      94 % on Room air Pulse rate:   103 / minute BP sitting:   120 / 72  (left arm) Cuff size:   regular  Vitals Entered By: Boone Master CNA (December 23, 2008 11:53 AM)  O2 Flow:  Room air CC: increased SOB, wheezing, cough occasionally producing yellow/green mucus x3-4weeks Is Patient Diabetic? Yes  Comments Medications reviewed with patient Daytime contact number verified with patient. Boone Master CNA  December 23, 2008 11:53 AM    Physical Exam  Additional Exam:  GEN: A/Ox3; pleasant , NAD HEENT:  [place]/AT, , EACs-clear, TMs-wnl, NOSE-pale. , THROAT-clear NECK:  Supple w/ fair ROM; no JVD; normal carotid impulses w/o bruits; no thyromegaly or nodules palpated; no lymphadenopathy. RESP  Coarse BS  w/ upper airway psuedowheeze noted.  CARD:  RRR, no m/r/g   GI:   Soft & nt; nml bowel sounds; no organomegaly or masses detected. [place]: Warm bil,  no calf tenderness edema, clubbing, pulses intact Neuro: no focal deficits noted.    Impression & Recommendations:  Problem # 1:  COUGH (ICD-786.2)  Multifactoral in nature w/ upper airway cough syndrome (cough started 06/2008 after ace, did not stop after ace stopped) ?GERD/Rhinitis/Asthma -uncontrolled. Will treat possible triggers along  chronic cough control.  REC: Add Mucinex DM two times a day  Tessalon perles 200mg  three times a day  May use Tramadol 50mg  every 4 hrs for breakthrough NO MINTS, use sugarless candy to help avoid throat cleairng, coughing.  Stop fish oil, supplement, may continue on calicum and vitamin d  Prednisone taper over next week, call if sugar>250. Brush, rinse and gargle after inhaler use.  Change to Zyrtec 10mg  at bedtime  Increase prilosec 20mg  two times a day before a meal.  Add Pepcid 20mg  at bedtime  follow up 2 weeks.   Orders: Est. Patient Level IV ([address])  Medications Added to Medication List This Visit: 1)  Prednisone 10 Mg Tabs (Prednisone) .... 4 tabs for 2 days, then 3 tabs for 2 days, 2 tabs for 2 days, then 1 tab for 2 days, then stop 2)  Tessalon 200 Mg Caps (Benzonatate) .Marland Kitchen.. 1 by mouth three times a day 3)  Tramadol Hcl 50 Mg Tabs (Tramadol hcl) .Marland Kitchen.. 1 by mouth every 4 hr as needed.  Complete Medication List: 1)  Symbicort 160-4.5 Mcg/act Aero (Budesonide-formoterol fumarate) .... Two puffs two times a day 2)  Nasonex 50 Mcg/act Susp (Mometasone furoate) .... Two sprays once daily 1 3)  Singulair 10 Mg Tabs (Montelukast sodium) .... Once daily 4)  Loratadine 10 Mg Tabs (Loratadine) .... One by mouth once daily as needed 5)  Albuterol Sulfate (2.5 Mg/83ml) 0.083% Nebu (Albuterol sulfate) .... Nebulizer inhalation every 6 hours as needed 6)  Atrovent 0.5 % Soln (ipratropium Bromide)  .... Nebulized inhalation every 6 hours as needed 7)  Synthroid 50 Mcg Tabs (Levothyroxine sodium) .... Qd 8)  Cardizem Cd 240 Mg Xr24h-cap (Diltiazem hcl coated beads) .... Once daily 9)  Benicar 40 Mg Tabs (Olmesartan medoxomil) .... One tab daily 10)  Aspirin 81 Mg Tbec (Aspirin) .... Once daily 11)  Simvastatin 20 Mg Tabs (Simvastatin) .... One tab at bedtime daily 12)  Byetta 10 Mcg Pen 10 Mcg/0.82ml Soln (Exenatide) .... Two times a day 13)  Metformin Hcl 1000 Mg Tabs (Metformin hcl) ....  One by mouth bid 14)  Lantus Solostar 100 Unit/ml Soln (Insulin glargine) .Marland Kitchen.. 10 units [place] once daily 15)  Prilosec 20 Mg Cpdr (Omeprazole) .... Once daily 16)  Lyrica 150 Mg Caps (Pregabalin) .... Daily two times a day 17)  Cymbalta 60 Mg Cpep (Duloxetine hcl) .... Once daily 18)  Amitriptyline Hcl 25  Mg Tabs (Amitriptyline hcl) .... At bedtime 19)  Bd U/f Short Pen Needle 31g X 8 Mm Misc (Insulin pen needle) .... Use as directed 20)  Ra Fish Oil 1000 Mg Caps (Omega-3 fatty acids) .Marland Kitchen.. 1 by mouth daily 21)  Vitamin C 500 Mg Tabs (Ascorbic acid) .Marland Kitchen.. 1 by mouth daily 22)  Vitamin B-12 500 Mcg Tabs (Cyanocobalamin) .Marland Kitchen.. 1 by mouth daily 23)  Vitamin D 1000 Unit Caps (Cholecalciferol) .Marland Kitchen.. 1 by mouth daily 24)  Vitamin E 200 Unit Caps (Vitamin e) .Marland Kitchen.. 1 by mouth daily 25)  Biotin  .Marland Kitchen.. 1 by mouth daily 26)  Prednisone 10 Mg Tabs (Prednisone) .... 4 tabs for 2 days, then 3 tabs for 2 days, 2 tabs for 2 days, then 1 tab for 2 days, then stop 27)  Tessalon 200 Mg Caps (Benzonatate) .Marland Kitchen.. 1 by mouth three times a day 28)  Tramadol Hcl 50 Mg Tabs (Tramadol hcl) .Marland Kitchen.. 1 by mouth every 4 hr as needed.  Patient Instructions: 1)  Add Mucinex DM two times a day  2)  Tessalon perles 200mg  three times a day  3)  May use Tramadol 50mg  every 4 hrs for breakthrough 4)  NO MINTS, use sugarless candy to help avoid throat cleairng, coughing.  5)  Stop fish oil, supplement, may continue on calicum and vitamin d  6)  Prednisone taper over next week, call if sugar>250. 7)  Brush, rinse and gargle after inhaler use.  8)  Change to Zyrtec 10mg  at bedtime  9)  Increase prilosec 20mg  two times a day before a meal.  10)  Add Pepcid 20mg  at bedtime  11)  follow up 2 weeks.  Prescriptions: TRAMADOL HCL 50 MG TABS (TRAMADOL HCL) 1 by mouth every 4 hr as needed.  #45 x 1   Entered and Authorized by:   Rubye Oaks NP   Signed by:   Tammy Parrett NP on 12/23/2008   Method used:   [facility]  [place] [address]       [address]       [place], [place]  [address]       Ph: [phone] or [phone]       Fax: [phone]   RxID:   [id number] TESSALON 200 MG CAPS (BENZONATATE) 1 by mouth three times a day  #40 x 1   Entered and Authorized by:   Rubye Oaks NP   Signed by:   Tammy Parrett NP on 12/23/2008   Method used:   [facility]        [facility]  [place] [address] ([facility]       [address]       [place], [place]  [address]       Ph: [phone] or [phone]       Fax: [phone]   RxID:   [id number] PREDNISONE 10 MG TABS (PREDNISONE) 4 tabs for 2 days, then 3 tabs for 2 days, 2 tabs for 2 days, then 1 tab for 2 days, then stop  #20 x 0   Entered and Authorized by:   Rubye Oaks NP   Signed by:   Tammy Parrett NP on 12/23/2008   Method used:   [facility]        [facility]  [place] [address]       [address]       [place], [place]  [address]       Ph: [phone] or [phone]       Fax: [phone]   RxID:   [id number]

## 2010-06-09 NOTE — Progress Notes (Signed)
Summary: Pharmacy asking for confirmation of Lantus dose change  Phone Note From Pharmacy   [facility]  [name] Call For: Casey Harrell  Summary of Call: [facility] calling to confirm new dosing of pt Lantus.  They sent a refill request for reill pt instructions 10 units [place] daily.  Pt telling she is currently using 30 units in the AM and 45 units in the PM.  If this is really true, she needs 3 boxes to last 30 days.  Please see note faxed from pharmacist on Dr Lucie Leather desk Initial call taken by: Sid Falcon LPN,  July 28, 2009 12:27 PM  Follow-up for Phone Call        correct dose is Lantus 30 untis AM and 45 units at night. Follow-up by: Evelena Peat MD,  July 28, 2009 1:12 PM    Prescriptions: LANTUS SOLOSTAR 100 UNIT/ML SOLN (INSULIN GLARGINE) 30 units q AM and 45 units q PM  #3 x 11   Entered by:   Sid Falcon LPN   Authorized by:   Evelena Peat MD   Signed by:   Sid Falcon LPN on 04/54/0981   Method used:   [facility]        [facility]  [place] [address]       [address]       [place], [place]  [address]       Ph: [phone] or [phone]       Fax: [phone]   RxID:   [id number]

## 2010-06-09 NOTE — Medication Information (Signed)
Summary: [facility]ate[facility]  [facility]   Imported By: Lester Jacona 03/08/2009 09:47:43  _____________________________________________________________________  External Attachment:    Type:   Image     Comment:   External Document

## 2010-06-09 NOTE — Assessment & Plan Note (Signed)
Summary: rov 1 month ///kp   Primary Provider/Referring Provider:  Evelena Peat MD  CC:  1 month followup.  Pt c/o cough- still no better.  She states that cough is the same and is no worse than before.  She also c/o wheezing at night when she lies down.Marland Kitchen  History of Present Illness: 76 yo female with cough, asthma, rhinitis, OSA, and GERD  She continues to have wheeze at night.  She coughs some during the day also.  She brings up clear to yellow sputum in the morning.  Her sinuses are okay, and has been using nasonex.  She has been feeling more hoarse.  She denies chest pain or reflux.  She continues with prilosec and pepcid.  She is using tramadol every 4 hours.  She has not been using tessalon.   -  Date:  01/29/2009    FVC: 1.88    FVC % EXPECT: 73    FEV1: 1.57    FEV1 % EXP: 82    FEF 25-75% 1.85    FEF %Expect 111   Current Medications (verified): 1)  Symbicort 160-4.5 Mcg/act Aero (Budesonide-Formoterol Fumarate) .... Two Puffs Two Times A Day 2)  Singulair 10 Mg Tabs (Montelukast Sodium) .... Once Daily 3)  Albuterol Sulfate (2.5 Mg/22ml) 0.083% Nebu (Albuterol Sulfate) .... Nebulizer Inhalation Every 6 Hours As Needed 4)  Atrovent 0.5 % Soln (Ipratropium Bromide) .... Nebulized Inhalation Every 6 Hours As Needed 5)  Nasonex 50 Mcg/act Susp (Mometasone Furoate) .... Two Sprays Once Daily 1 6)  Loratadine 10 Mg Tabs (Loratadine) .... One By Mouth Once Daily As Needed 7)  Synthroid 50 Mcg Tabs (Levothyroxine Sodium) .... Qd 8)  Cardizem Cd 240 Mg Xr24h-Cap (Diltiazem Hcl Coated Beads) .... Once Daily 9)  Benicar 40 Mg Tabs (Olmesartan Medoxomil) .... One Tab Daily 10)  Aspirin 81 Mg Tbec (Aspirin) .... Once Daily 11)  Simvastatin 20 Mg Tabs (Simvastatin) .... One Tab At Bedtime Daily 12)  Byetta 10 Mcg Pen 10 Mcg/0.66ml Soln (Exenatide) .... Two Times A Day 13)  Metformin Hcl 1000 Mg Tabs (Metformin Hcl) .... One By Mouth Bid 14)  Lantus Solostar 100 Unit/ml Soln  (Insulin Glargine) .Marland Kitchen.. 10 Units [place] Once Daily 15)  Prilosec 20 Mg Cpdr (Omeprazole) .Marland Kitchen.. 1 Three Times A Day 16)  Pepcid 20 Mg Tabs (Famotidine) .Marland Kitchen.. 1 Once Daily 17)  Lyrica 150 Mg Caps (Pregabalin) .... Daily Two Times A Day 18)  Cymbalta 60 Mg Cpep (Duloxetine Hcl) .... Once Daily 19)  Amitriptyline Hcl 25 Mg Tabs (Amitriptyline Hcl) .... At Bedtime 20)  Bd U/f Short Pen Needle 31g X 8 Mm Misc (Insulin Pen Needle) .... Use As Directed 21)  Biotin .Marland Kitchen.. 1 By Mouth Daily 22)  Tessalon 200 Mg Caps (Benzonatate) .Marland Kitchen.. 1 By Mouth Three Times A Day As Needed 23)  Tramadol Hcl 50 Mg Tabs (Tramadol Hcl) .Marland Kitchen.. 1 By Mouth Every 4 Hr As Needed.  Allergies (verified): 1)  ! Doxycycline Hyclate (Doxycycline Hyclate) 2)  ! Avelox (Moxifloxacin Hcl)  Past History:  Past Medical History: Asthma      - Spirometry 11/25/08 FEV1 1.48(86%), FVC 1.85(75%), FEV1% 80      - cxr 7/21, nml.  Allergic rhinitis Diverticulosis, colon GERD Hypertension Urinary incontinence Diabetes Diabetic peripheral neuropathy OSA, intolerant of CPAP  Past Surgical History: Reviewed history from 11/25/2008 and no changes required. Appendectomy 1971 Hysterectomy  1971 Tonsillectomy Tubal ligation  Family History: Reviewed history from 07/15/2008 and no changes required. Family History  of Arthritis  patent Family History Diabetes 1st degree relative  parent Family History Lung cancer  grandparent Family History of Stroke F 1st degree relative <60  grandparent Family History of Cardiovascular disorder  parent  Social History: Reviewed history from 07/15/2008 and no changes required. Retired Married Never Smoked Alcohol use-no  Vital Signs:  Patient profile:   76 year old female Height:      61 inches Weight:      153 pounds BMI:     29.01 O2 Sat:      91 % on Room air Temp:     98.1 degrees F oral Pulse rate:   123 / minute BP sitting:   140 / 70  (left arm)  Vitals Entered By: Vernie Murders  (January 29, 2009 2:19 PM)  O2 Flow:  Room air  Physical Exam  General:  normal appearance and obese.   Nose:  no deformity, discharge, inflammation, or lesions Mouth:  no deformity or lesions Neck:  wheeze over throat Lungs:  b/l exp wheeze Heart:  regular rhythm, normal rate, no murmurs, and no rubs.   Abdomen:  bowel sounds positive; abdomen soft and non-tender without masses, or organomegaly Pulses:  pulses normal Extremities:  no clubbing, cyanosis, edema, or deformity noted Neurologic:  CN II-XII grossly intact with normal reflexes, coordination, muscle strength and tone Cervical Nodes:  no significant adenopathy   Pre-Spirometry FVC    Value: 1.88 L/min   % Pred: 73 % FEV1    Value: 1.57 L     % Pred: 82 % FEF 25-75  Value: 1.85 L/min   % Pred: 111 %  Impression & Recommendations:  Problem # 1:  HOARSENESS (ZOX-096.04) I will refer her to ENT for further evaluation of this.  My suspicion is this is related to post-nasal drip and reflux.  Problem # 2:  COUGH (ICD-786.2) Will give her a taper of prednisone for her asthma.  She is to continue her inhalers and singulair.  Problem # 3:  ALLERGIC RHINITIS (ICD-477.9) She is to continue nasal irrigation, nasonex, singulair, and loratadine.  I will also check her RAST and IgE.  Problem # 4:  OBSTRUCTIVE SLEEP APNEA (ICD-327.23) She is intolerant of CPAP.  I am concerned that some of her nocturnal symptoms may in fact be related to her sleep apnea.  Problem # 5:  GERD (ICD-530.81) She is to continue on prilosec and pepcid for now.  Medications Added to Medication List This Visit: 1)  Prilosec 20 Mg Cpdr (Omeprazole) .Marland Kitchen.. 1 three times a day 2)  Pepcid 20 Mg Tabs (Famotidine) .Marland Kitchen.. 1 once daily 3)  Prednisone 10 Mg Tabs (Prednisone) .... 4 once daily for 2 days, then 3 once daily for 2 days, then 2 once daily for 2 days, 1 once daily for 2 days, then 1/2 once daily for 2 days  Complete Medication List: 1)  Symbicort  160-4.5 Mcg/act Aero (Budesonide-formoterol fumarate) .... Two puffs two times a day 2)  Singulair 10 Mg Tabs (Montelukast sodium) .... Once daily 3)  Albuterol Sulfate (2.5 Mg/37ml) 0.083% Nebu (Albuterol sulfate) .... Nebulizer inhalation every 6 hours as needed 4)  Atrovent 0.5 % Soln (ipratropium Bromide)  .... Nebulized inhalation every 6 hours as needed 5)  Nasonex 50 Mcg/act Susp (Mometasone furoate) .... Two sprays once daily 1 6)  Loratadine 10 Mg Tabs (Loratadine) .... One by mouth once daily as needed 7)  Synthroid 50 Mcg Tabs (Levothyroxine sodium) .... Qd 8)  Cardizem Cd  240 Mg Xr24h-cap (Diltiazem hcl coated beads) .... Once daily 9)  Benicar 40 Mg Tabs (Olmesartan medoxomil) .... One tab daily 10)  Aspirin 81 Mg Tbec (Aspirin) .... Once daily 11)  Simvastatin 20 Mg Tabs (Simvastatin) .... One tab at bedtime daily 12)  Byetta 10 Mcg Pen 10 Mcg/0.74ml Soln (Exenatide) .... Two times a day 13)  Metformin Hcl 1000 Mg Tabs (Metformin hcl) .... One by mouth bid 14)  Lantus Solostar 100 Unit/ml Soln (Insulin glargine) .Marland Kitchen.. 10 units [place] once daily 15)  Prilosec 20 Mg Cpdr (Omeprazole) .Marland Kitchen.. 1 three times a day 16)  Pepcid 20 Mg Tabs (Famotidine) .Marland Kitchen.. 1 once daily 17)  Lyrica 150 Mg Caps (Pregabalin) .... Daily two times a day 18)  Cymbalta 60 Mg Cpep (Duloxetine hcl) .... Once daily 19)  Amitriptyline Hcl 25 Mg Tabs (Amitriptyline hcl) .... At bedtime 20)  Bd U/f Short Pen Needle 31g X 8 Mm Misc (Insulin pen needle) .... Use as directed 21)  Biotin  .Marland Kitchen.. 1 by mouth daily 22)  Tessalon 200 Mg Caps (Benzonatate) .Marland Kitchen.. 1 by mouth three times a day as needed 23)  Tramadol Hcl 50 Mg Tabs (Tramadol hcl) .Marland Kitchen.. 1 by mouth every 4 hr as needed. 24)  Prednisone 10 Mg Tabs (Prednisone) .... 4 once daily for 2 days, then 3 once daily for 2 days, then 2 once daily for 2 days, 1 once daily for 2 days, then 1/2 once daily for 2 days  Other Orders: TLB-BMP (Basic Metabolic Panel-BMET)  (80048-METABOL) TLB-CBC Platelet - w/Differential (85025-CBCD) TLB-Hepatic/Liver Function Pnl (80076-HEPATIC) T-"RAST" (Allergy Full Profile) IGE ([address]) Est. Patient Level III ([address]) Spirometry w/Graph ([address]) ENT Referral (ENT) Internal Medicine Referral (Internal)  Patient Instructions: 1)  Prednisone as directed 2)  Will schedule appointment with ENT 3)  Lab tests today 4)  Follow up in 3 to 4 weeks Prescriptions: PREDNISONE 10 MG TABS (PREDNISONE) 4 once daily for 2 days, then 3 once daily for 2 days, then 2 once daily for 2 days, 1 once daily for 2 days, then 1/2 once daily for 2 days  #21 x 0   Entered and Authorized by:   [name] MD   Signed by:   [name] MD on 01/29/2009   Method used:   [facility]        [facility]  [place] [address] [facility]       [address]       [place], [place]  [address]       Ph: [phone] or [phone]       Fax: [phone]   RxID:   [id number]

## 2010-06-09 NOTE — Progress Notes (Signed)
Summary: INFO CONCERNINGREQ FOR REFILL ON MED  Phone Note Call from Patient   Caller: Patient Reason for Call: Refill Medication Summary of Call: Pt called to adv that she is currently out of med (Lantus Solostar- 100 unit/ml.Marland KitchenMarland KitchenInsulin) and has been out since Saturday am.... Pt req that Rx Request for same has been faxed to [facility] per [facility] staff.... Adv pt that req was probably received today and will be delivered to Dr Caryl Never for f/u. Initial call taken by: Debbra Riding,  March 22, 2009 9:36 AM  Follow-up for Phone Call        Rx sent electronically, pt informed Follow-up by: Sid Falcon LPN,  March 22, 2009 11:02 AM    Prescriptions: LANTUS SOLOSTAR 100 UNIT/ML SOLN (INSULIN GLARGINE) 10 units [place] once daily  #15 [place] x 2   Entered by:   Sid Falcon LPN   Authorized by:   Evelena Peat MD   Signed by:   Sid Falcon LPN on 16/02/9603   Method used:   [facility]ronically to        [facility]  [place] [address] [facility]       [address]       [place], [place]  [address]       Ph: [phone] or [phone]       Fax: [phone]   RxID:   [id number]

## 2010-06-09 NOTE — Procedures (Signed)
Summary: Colon Prep/[facility]  Colon Prep/[facility]   Imported By: Lester Roscoe 01/17/2010 12:15:10  _____________________________________________________________________  External Attachment:    Type:   Image     Comment:   External Document

## 2010-06-09 NOTE — Progress Notes (Signed)
Summary: Hydrocodone cough syrup refill request  Phone Note From Pharmacy   [facility]  [name] Call For: burchette  Summary of Call: faxed rx request for Hydrocodone syrup, last filled on 03/29/2010 Initial call taken by: Sid Falcon LPN,  April 27, 2010 12:31 PM  Follow-up for Phone Call        OK to refill once Follow-up by: Evelena Peat MD,  April 27, 2010 1:09 PM    Prescriptions: HYDROCODONE-HOMATROPINE 5-1.5 MG/5ML SYRP (HYDROCODONE-HOMATROPINE) one tsp by mouth q 4-6 hours as needed cough  #120 ml x 0   Entered by:   Sid Falcon LPN   Authorized by:   Evelena Peat MD   Signed by:   Sid Falcon LPN on 91/47/8295   Method used:   Telephoned to ...       [facility]  [place] [address] (retail[facility]       [address]       [place], [place]  [address]       Ph: [phone] or [phone]       Fax: [phone]   RxID:   [id number]

## 2010-06-09 NOTE — Miscellaneous (Signed)
Summary: Spirometry   Pulmonary Function Test Date: 11/25/2008 Height (in.): 61 Gender: Female  Pre-Spirometry FVC    Value: 1.85 L/min   Pred: 2.46 L/min     % Pred: 75 % FEV1    Value: 1.48 L     Pred: 1.72 L     % Pred: 86 % FEV1/FVC  Value: 80 %     Pred: 72 %     % Pred: 111 % FEF 25-75  Value: 1.49 L/min   Pred: 2.08 L/min     % Pred: 71 %  Comments: Normal spirometry. Clinical Lists Changes  Observations: Added new observation of PFT COMMENTS: Normal spirometry. (11/25/2008 19:00) Added new observation of FEF % EXPEC: 71 % (11/25/2008 19:00) Added new observation of FEF25-75%PRE: 2.08 L/min (11/25/2008 19:00) Added new observation of FEF 25-75%: 1.49 L/min (11/25/2008 19:00) Added new observation of FEV1/FVC%EXP: 111 % (11/25/2008 19:00) Added new observation of FEV1/FVC PRE: 72 % (11/25/2008 19:00) Added new observation of FEV1/FVC: 80 % (11/25/2008 19:00) Added new observation of FEV1 % EXP: 86 % (11/25/2008 19:00) Added new observation of FEV1 PREDICT: 1.72 L (11/25/2008 19:00) Added new observation of FEV1: 1.48 L (11/25/2008 19:00) Added new observation of FVC % EXPECT: 75 % (11/25/2008 19:00) Added new observation of FVC PREDICT: 2.46 L (11/25/2008 19:00) Added new observation of FVC: 1.85 L (11/25/2008 19:00) Added new observation of PFT HEIGHT: 61  (11/25/2008 19:00) Added new observation of PFT DATE: 11/25/2008  (11/25/2008 19:00)

## 2010-06-09 NOTE — Medication Information (Signed)
Summary: Order for Diabetic Testing Supplies  Order for Diabetic Testing Supplies   Imported By: Maryln Gottron 02/02/2010 10:45:29  _____________________________________________________________________  External Attachment:    Type:   Image     Comment:   External Document

## 2010-06-09 NOTE — Procedures (Signed)
Summary: 24 HR Ambulatory pH Monitoring/[facility]  24 HR Ambulatory pH Monitoring/[facility]   Imported By: Sherian Rein 09/08/2009 14:16:51  _____________________________________________________________________  External Attachment:    Type:   Image     Comment:   External Document

## 2010-06-09 NOTE — Assessment & Plan Note (Signed)
Summary: 3 weeks/ mbw   Copy to:  [facility] Primary Provider/Referring Provider:  Evelena Peat MD  CC:  Pt still c/o cough and hoarseness.  She did see Dr. Brion Aliment and was started on Keflex.Marland Kitchen  History of Present Illness: 76 yo female with cough, asthma, rhinitis, OSA, and GERD  She was seen by Dr. Haroldine Laws, and started on keflex.  This has helped.  She is feeling some better.  She still has some cough and hoarseness.  She gets wheeze still in her throat.  She did not notice any difference from her course of prednisone, or from her inhalers.   Current Medications (verified): 1)  Symbicort 160-4.5 Mcg/act Aero (Budesonide-Formoterol Fumarate) .... Two Puffs Two Times A Day 2)  Singulair 10 Mg Tabs (Montelukast Sodium) .... Once Daily 3)  Albuterol Sulfate (2.5 Mg/6ml) 0.083% Nebu (Albuterol Sulfate) .... Nebulizer Inhalation Every 6 Hours As Needed 4)  Atrovent 0.5 % Soln (Ipratropium Bromide) .... Nebulized Inhalation Every 6 Hours As Needed 5)  Nasonex 50 Mcg/act Susp (Mometasone Furoate) .... Two Sprays Once Daily 1 6)  Loratadine 10 Mg Tabs (Loratadine) .... One By Mouth Once Daily As Needed 7)  Synthroid 50 Mcg Tabs (Levothyroxine Sodium) .... Qd 8)  Cardizem Cd 240 Mg Xr24h-Cap (Diltiazem Hcl Coated Beads) .... Once Daily 9)  Benicar 40 Mg Tabs (Olmesartan Medoxomil) .... One Tab Daily 10)  Aspirin 81 Mg Tbec (Aspirin) .... Once Daily 11)  Simvastatin 20 Mg Tabs (Simvastatin) .... One Tab At Bedtime Daily 12)  Byetta 10 Mcg Pen 10 Mcg/0.39ml Soln (Exenatide) .... Two Times A Day 13)  Metformin Hcl 1000 Mg Tabs (Metformin Hcl) .... One By Mouth Bid 14)  Lantus Solostar 100 Unit/ml Soln (Insulin Glargine) .Marland Kitchen.. 10 Units [place] Once Daily 15)  Prilosec 20 Mg Cpdr (Omeprazole) .Marland Kitchen.. 1 Three Times A Day 16)  Pepcid 20 Mg Tabs (Famotidine) .Marland Kitchen.. 1 Once Daily 17)  Lyrica 150 Mg Caps (Pregabalin) .... Daily Two Times A Day 18)  Cymbalta 60 Mg Cpep (Duloxetine Hcl) .... Once Daily 19)   Amitriptyline Hcl 25 Mg Tabs (Amitriptyline Hcl) .... At Bedtime 20)  Bd U/f Short Pen Needle 31g X 8 Mm Misc (Insulin Pen Needle) .... Use As Directed 21)  Biotin .Marland Kitchen.. 1 By Mouth Daily 22)  Tessalon 200 Mg Caps (Benzonatate) .Marland Kitchen.. 1 By Mouth Three Times A Day As Needed 23)  Tramadol Hcl 50 Mg Tabs (Tramadol Hcl) .Marland Kitchen.. 1 By Mouth Every 4 Hr As Needed. 24)  Keflex 250 Mg Caps (Cephalexin) .Marland Kitchen.. 1 By Mouth Daily 25)  Diflucan 100 Mg Tabs (Fluconazole) .... 3 Tabs Given; Pt To Take Every 5 Days Until Gone  Allergies (verified): 1)  ! Doxycycline Hyclate (Doxycycline Hyclate) 2)  ! Avelox (Moxifloxacin Hcl)  Past History:  Past Medical History: Asthma      - Spirometry 11/25/08 FEV1 1.48(86%), FVC 1.85(75%), FEV1% 80      - IgE 01/29/09 was 86.5      - CXR 7/21, normal Allergic rhinitis Diverticulosis, colon GERD Hypertension Urinary incontinence Diabetes Diabetic peripheral neuropathy OSA, intolerant of CPAP  Vital Signs:  Patient profile:   76 year old female Height:      61 inches (154.94 cm) Weight:      158.50 pounds (72.05 kg) BMI:     30.06 O2 Sat:      97 % on Room air Temp:     97.7 degrees F (36.50 degrees C) oral Pulse rate:   95 / minute BP  sitting:   126 / 80  (left arm) Cuff size:   regular  Vitals Entered By: Michel Bickers CMA (February 23, 2009 9:37 AM)  O2 Flow:  Room air  Physical Exam  General:  normal appearance and obese.   Nose:  no deformity, discharge, inflammation, or lesions Mouth:  no deformity or lesions Neck:  no JVD.   Lungs:  clear bilaterally to auscultation and percussion Heart:  regular rhythm, normal rate, no murmurs, and no rubs.   Abdomen:  bowel sounds positive; abdomen soft and non-tender without masses, or organomegaly Extremities:  no clubbing, cyanosis, edema, or deformity noted Cervical Nodes:  no significant adenopathy   Impression & Recommendations:  Problem # 1:  HOARSENESS (EAV-409.81) Likely from postnasal drip, and  sinus disease.  May also be exacerbated by inhalers.  She may have a component of vocal cord dysfunction as well.  Problem # 2:  ALLERGIC RHINITIS (ICD-477.9) She is to continue on her course of antibiotics as ordered by ENT, and continue he sinus regimen.  Problem # 3:  OBSTRUCTIVE SLEEP APNEA (ICD-327.23)  She is intolerant of CPAP.  I am concerned that some of her nocturnal symptoms may in fact be related to her sleep apnea.  Will continue to monitor.  Problem # 4:  ASTHMA (ICD-493.90) Will have her stop symbicort as this may be causing some upper airway irritation.  In addition her current symptoms are not as suggestive of asthma.  She is to continue with nebulizer as needed and singulair.  Medications Added to Medication List This Visit: 1)  Keflex 250 Mg Caps (Cephalexin) .Marland Kitchen.. 1 by mouth daily 2)  Diflucan 100 Mg Tabs (Fluconazole) .... 3 tabs given; pt to take every 5 days until gone  Complete Medication List: 1)  Singulair 10 Mg Tabs (Montelukast sodium) .... Once daily 2)  Albuterol Sulfate (2.5 Mg/56ml) 0.083% Nebu (Albuterol sulfate) .... Nebulizer inhalation every 6 hours as needed 3)  Atrovent 0.5 % Soln (ipratropium Bromide)  .... Nebulized inhalation every 6 hours as needed 4)  Nasonex 50 Mcg/act Susp (Mometasone furoate) .... Two sprays once daily 1 5)  Loratadine 10 Mg Tabs (Loratadine) .... One by mouth once daily as needed 6)  Synthroid 50 Mcg Tabs (Levothyroxine sodium) .... Qd 7)  Cardizem Cd 240 Mg Xr24h-cap (Diltiazem hcl coated beads) .... Once daily 8)  Benicar 40 Mg Tabs (Olmesartan medoxomil) .... One tab daily 9)  Aspirin 81 Mg Tbec (Aspirin) .... Once daily 10)  Simvastatin 20 Mg Tabs (Simvastatin) .... One tab at bedtime daily 11)  Byetta 10 Mcg Pen 10 Mcg/0.42ml Soln (Exenatide) .... Two times a day 12)  Metformin Hcl 1000 Mg Tabs (Metformin hcl) .... One by mouth bid 13)  Lantus Solostar 100 Unit/ml Soln (Insulin glargine) .Marland Kitchen.. 10 units [place] once daily 14)   Prilosec 20 Mg Cpdr (Omeprazole) .Marland Kitchen.. 1 three times a day 15)  Pepcid 20 Mg Tabs (Famotidine) .Marland Kitchen.. 1 once daily 16)  Lyrica 150 Mg Caps (Pregabalin) .... Daily two times a day 17)  Cymbalta 60 Mg Cpep (Duloxetine hcl) .... Once daily 18)  Amitriptyline Hcl 25 Mg Tabs (Amitriptyline hcl) .... At bedtime 19)  Bd U/f Short Pen Needle 31g X 8 Mm Misc (Insulin pen needle) .... Use as directed 20)  Biotin  .Marland Kitchen.. 1 by mouth daily 21)  Tessalon 200 Mg Caps (Benzonatate) .Marland Kitchen.. 1 by mouth three times a day as needed 22)  Tramadol Hcl 50 Mg Tabs (Tramadol hcl) .Marland Kitchen.. 1 by  mouth every 4 hr as needed. 23)  Keflex 250 Mg Caps (Cephalexin) .Marland Kitchen.. 1 by mouth daily 24)  Diflucan 100 Mg Tabs (Fluconazole) .... 3 tabs given; pt to take every 5 days until gone  Other Orders: Est. Patient Level III ([address])  Patient Instructions: 1)  Stop symbicort 2)  Follow up in 6 to 8 weeks

## 2010-06-09 NOTE — Progress Notes (Signed)
Summary: N and V  Phone Note Call from Patient   Summary of Call: Pt is having severe nausea and vomiting since returning from the hospital.  No fever, and no pain.  Pt went home on Cipro, Zofran and Flagyl,and she feels some of the meds could be causing the nausea.  No abdominal pain or diarrhea. [phone] Initial call taken by: Lynann Beaver CMA,  August 30, 2009 12:38 PM  Follow-up for Phone Call        have her hold Flagyl but try to continue Cipro and see if we can get her in in the next couple of days to reassess. Follow-up by: Evelena Peat MD,  August 30, 2009 1:07 PM  Additional Follow-up for Phone Call Additional follow up Details #1::        Pt will stop the Flagyl, and continue Cipro.   Additional Follow-up by: Lynann Beaver CMA,  August 31, 2009 9:07 AM

## 2010-06-09 NOTE — Procedures (Signed)
Summary: EGD and biopsy   EGD  Procedure date:  01/31/2005  Findings:      Findings: Gastritis  Location: [facility]   Patient Name: Casey Harrell, Casey Harrell MRN:  Procedure Procedures: Panendoscopy (EGD) CPT: 43235.    with biopsy(s)/brushing(s). CPT: D1846139.  Personnel: Endoscopist: Venita Lick. Russella Dar, MD, Clementeen Graham.  Exam Location: Exam performed in Outpatient Clinic. Outpatient  Patient Consent: Procedure, Alternatives, Risks and Benefits discussed, consent obtained, from patient. Consent was obtained by the RN.  Indications Symptoms: Nausea. Vomiting. Reflux symptoms  History  Current Medications: Patient is not currently taking Coumadin.  Pre-Exam Physical: Performed Jan 31, 2005  Cardio-pulmonary exam, HEENT exam, Abdominal exam, Mental status exam WNL. Abnormal PE findings include: ext. hem.  Exam Exam Info: Maximum depth of insertion Duodenum, intended Duodenum. Vocal cords not visualized. Gastric retroflexion performed. ASA Classification: II. Tolerance: excellent.  Sedation Meds: Patient assessed and found to be appropriate for moderate (conscious) sedation. Residual sedation present from prior procedure today. Cetacaine Spray 2 sprays given aerosolized. Versed 2 mg. given IV. Fentanyl 25 mcg. given IV.  Monitoring: BP and pulse monitoring done. Oximetry used. Supplemental O2 given  Findings Normal: Proximal Esophagus to Distal Esophagus.  Normal: Duodenal Bulb to Duodenal 2nd Portion.  MUCOSAL ABNORMALITY: Pyloric Sphincter to Cardia. Erythematous mucosa. Granular mucosa. Biopsy/Mucosal Abn taken. ICD9: Gastritis, Unspecified: 535.50.  NODULE: Maximum size: 3 mm. in Body. Erosions not present. Biopsy/Nodule taken. ICD9: Neoplasia, Benign, Stomach: 211.1.   Assessment  Diagnoses: 535.50: Gastritis, Unspecified.  211.1: Neoplasia, Benign, Stomach.   Events  Unplanned Intervention: No unplanned interventions were required.  Unplanned Events: There  were no complications. Plans Medication(s): Await pathology.  Patient Education: Patient given standard instructions for: Reflux. Mucosal Abnormality.  Disposition: After procedure patient sent to recovery. After recovery patient sent home.  Scheduling: SBFT, next available appt.  Office Visit, to [employer]. Russella Dar, MD, [facility], around Mar 02, 2005.    This report was created from the original endoscopy report, which was reviewed and signed by the above listed endoscopist.    cc: Rema Fendt, MD      SP Surgical Pathology - STATUS: Final             By: Threasa Beards  ,        Perform Date: 74Sep06 00:01  Ordered By: Rica Records Date: 26Sep06 14:38  Facility: [facility]                               Department: CPATH  Service Report Text  [facility]   [address] [address]   [place], [place] [address]   Telephone [phone] or [phone] Fax [phone]    REPORT OF SURGICAL PATHOLOGY    Case #: [id number]   Patient Name: Casey Harrell, Casey Harrell   PID: [id number]   Pathologist: Havery Moros, MD   DOB/Age 12-29-1934 (Age: 76) Gender: F   Date Taken: 01/31/2005   Date Received: 01/31/2005    FINAL DIAGNOSIS    ***MICROSCOPIC EXAMINATION AND DIAGNOSIS***    1. SIGMOID COLON, POLYP: HYPERPLASTIC POLYP. NO ADENOMATOUS   CHANGE OR MALIGNANCY IDENTIFIED.    2. STOMACH, ANTRUM, BIOPSY:   - BENIGN GASTRIC MUCOSA WITH MILD CHRONIC GASTRITIS AND   REACTIVE EPITHELIAL   CHANGES.   - FOCAL INTESTINAL METAPLASIA.   - SEE COMMENT.  3. STOMACH, BODY, NODULE: BENIGN HYPERPLASTIC POLYP.    COMMENT   2. The changes are most compatible with reactive gastropathy   such as seen with alcohol, NSAIDS or reflux. A Warthin-Starry   stain is performed to determine the possibility of the presence   of Helicobacter pylori. The Warthin-Starry stain is negative for   organisms of Helicobacter pylori. The control(s) stained    appropriately.    3. A Warthin-Starry stain is performed to determine the   possibility of the presence of Helicobacter pylori. The   [place]-Starry stain is negative for organisms of Helicobacter   pylori. The control(s) stained appropriately. (BNS:caf:jy   02/01/05)    cf   Date Reported: 02/01/2005 Havery Moros, MD   *** Electronically Signed Out By BNS ***    Clinical information   1. R/O adenomatous 2. R/O gastritis   Reflux, diarrhea, nausea and vomiting (mw)    specimen(s) obtained   1: Colon, polyp(s), sigmoid   2: Stomach, antrum, biopsy   3: Stomach, body, nodule, biopsy    Gross Description   1. Received in formalin is a tan, soft tissue fragment that is   submitted in toto. Size: 0.4 cm.    2. Received in formalin are tan, soft tissue fragments that are   submitted in toto. Number: multiple   Size: 0.1 cm up to 0.3 cm.    3. Received in formalin is a tan, soft tissue fragment that is   submitted in toto. Size: 0.4 cm (SP:jy) 01/31/05    jy/

## 2010-06-09 NOTE — Assessment & Plan Note (Signed)
Summary: [name]/FUP/RCD   Vital Signs:  Patient profile:   76 year old female Weight:      153 pounds Temp:     97.9 degrees F oral Pulse rhythm:   regular Resp:     16 per minute BP sitting:   142 / 78  Vitals Entered By: Lynann Beaver CMA (February 12, 2009 10:27 AM) CC: rov Is Patient Diabetic? Yes  Pain Assessment Patient in pain? no        History of Present Illness: Followup multiple medical problems. Respiratory status is improved. She is taking high dose omeprazole it seems to help her cough. Also remains on multiple inhalers. Has not yet had flu vaccine.  CBGs have been somewhat elevated past few days her on 160. Remains on 70 units of Lantus along with Byetta and metformin. Last hemoglobin A1c 7.5% a few months ago. No hypoglycemic symptoms.  Patient reportedly had DEXA scan last year per her gynecologist. She'll be due for repeat next year. Taking calcium and vitamin D supplementation.  Current Medications (verified): 1)  Symbicort 160-4.5 Mcg/act Aero (Budesonide-Formoterol Fumarate) .... Two Puffs Two Times A Day 2)  Singulair 10 Mg Tabs (Montelukast Sodium) .... Once Daily 3)  Albuterol Sulfate (2.5 Mg/55ml) 0.083% Nebu (Albuterol Sulfate) .... Nebulizer Inhalation Every 6 Hours As Needed 4)  Atrovent 0.5 % Soln (Ipratropium Bromide) .... Nebulized Inhalation Every 6 Hours As Needed 5)  Nasonex 50 Mcg/act Susp (Mometasone Furoate) .... Two Sprays Once Daily 1 6)  Loratadine 10 Mg Tabs (Loratadine) .... One By Mouth Once Daily As Needed 7)  Synthroid 50 Mcg Tabs (Levothyroxine Sodium) .... Qd 8)  Cardizem Cd 240 Mg Xr24h-Cap (Diltiazem Hcl Coated Beads) .... Once Daily 9)  Benicar 40 Mg Tabs (Olmesartan Medoxomil) .... One Tab Daily 10)  Aspirin 81 Mg Tbec (Aspirin) .... Once Daily 11)  Simvastatin 20 Mg Tabs (Simvastatin) .... One Tab At Bedtime Daily 12)  Byetta 10 Mcg Pen 10 Mcg/0.42ml Soln (Exenatide) .... Two Times A Day 13)  Metformin Hcl 1000 Mg Tabs  (Metformin Hcl) .... One By Mouth Bid 14)  Lantus Solostar 100 Unit/ml Soln (Insulin Glargine) .Marland Kitchen.. 10 Units [place] Once Daily 15)  Prilosec 20 Mg Cpdr (Omeprazole) .Marland Kitchen.. 1 Three Times A Day 16)  Pepcid 20 Mg Tabs (Famotidine) .Marland Kitchen.. 1 Once Daily 17)  Lyrica 150 Mg Caps (Pregabalin) .... Daily Two Times A Day 18)  Cymbalta 60 Mg Cpep (Duloxetine Hcl) .... Once Daily 19)  Amitriptyline Hcl 25 Mg Tabs (Amitriptyline Hcl) .... At Bedtime 20)  Bd U/f Short Pen Needle 31g X 8 Mm Misc (Insulin Pen Needle) .... Use As Directed 21)  Biotin .Marland Kitchen.. 1 By Mouth Daily 22)  Tessalon 200 Mg Caps (Benzonatate) .Marland Kitchen.. 1 By Mouth Three Times A Day As Needed 23)  Tramadol Hcl 50 Mg Tabs (Tramadol Hcl) .Marland Kitchen.. 1 By Mouth Every 4 Hr As Needed. 24)  Prednisone 10 Mg Tabs (Prednisone) .... 4 Once Daily For 2 Days, Then 3 Once Daily For 2 Days, Then 2 Once Daily For 2 Days, 1 Once Daily For 2 Days, Then 1/2 Once Daily For 2 Days  Allergies (verified): 1)  ! Doxycycline Hyclate (Doxycycline Hyclate) 2)  ! Avelox (Moxifloxacin Hcl)  Past History:  Past Medical History: Last updated: 01/29/2009 Asthma      - Spirometry 11/25/08 FEV1 1.48(86%), FVC 1.85(75%), FEV1% 80      - cxr 7/21, nml.  Allergic rhinitis Diverticulosis, colon GERD Hypertension Urinary incontinence Diabetes Diabetic peripheral  neuropathy OSA, intolerant of CPAP  Past Surgical History: Last updated: 11/25/2008 Appendectomy 1971 Hysterectomy  1971 Tonsillectomy Tubal ligation  Family History: Last updated: 07/15/2008 Family History of Arthritis  patent Family History Diabetes 1st degree relative  parent Family History Lung cancer  grandparent Family History of Stroke F 1st degree relative <60  grandparent Family History of Cardiovascular disorder  parent  Social History: Last updated: 07/15/2008 Retired Married Never Smoked Alcohol use-no  Review of Systems  The patient denies anorexia, fever, weight loss, chest pain, syncope,  peripheral edema, prolonged cough, headaches, hemoptysis, abdominal pain, melena, and hematochezia.    Physical Exam  General:  Well-developed,well-nourished,in no acute distress; alert,appropriate and cooperative throughout examination Mouth:  Oral mucosa and oropharynx without lesions or exudates.  Teeth in good repair. Neck:  No deformities, masses, or tenderness noted. Lungs:  somewhat diminished breath sounds but clear throughout Heart:  Normal rate and regular rhythm. S1 and S2 normal without gallop, murmur, click, rub or other extra sounds. Abdomen:  Bowel sounds positive,abdomen soft and non-tender without masses, organomegaly or hernias noted.  Diabetes Management Exam:    Foot Exam (with socks and/or shoes not present):       Sensory-Pinprick/Light touch:          Left medial foot (L-4): absent          Left dorsal foot (L-5): absent          Left lateral foot (S-1): absent          Right medial foot (L-4): absent          Right dorsal foot (L-5): absent          Right lateral foot (S-1): absent       Sensory-Monofilament:          Left foot: absent          Right foot: absent       Inspection:          Left foot: normal          Right foot: normal       Nails:          Left foot: normal          Right foot: normal   Impression & Recommendations:  Problem # 1:  DIABETES-TYPE 2 (ICD-250.00)  Recheck hemoglobin A1c today. Flu vaccine be given. Her updated medication list for this problem includes:    Benicar 40 Mg Tabs (Olmesartan medoxomil) ..... One tab daily    Aspirin 81 Mg Tbec (Aspirin) ..... Once daily    Byetta 10 Mcg Pen 10 Mcg/0.76ml Soln (Exenatide) .Marland Kitchen..Marland Kitchen Two times a day    Metformin Hcl 1000 Mg Tabs (Metformin hcl) ..... One by mouth bid    Lantus Solostar 100 Unit/ml Soln (Insulin glargine) .Marland KitchenMarland KitchenMarland KitchenMarland Kitchen 10 units [place] once daily  Orders: Venipuncture (01027) TLB-A1C / Hgb A1C (Glycohemoglobin) (83036-A1C)  Complete Medication List: 1)  Symbicort 160-4.5  Mcg/act Aero (Budesonide-formoterol fumarate) .... Two puffs two times a day 2)  Singulair 10 Mg Tabs (Montelukast sodium) .... Once daily 3)  Albuterol Sulfate (2.5 Mg/25ml) 0.083% Nebu (Albuterol sulfate) .... Nebulizer inhalation every 6 hours as needed 4)  Atrovent 0.5 % Soln (ipratropium Bromide)  .... Nebulized inhalation every 6 hours as needed 5)  Nasonex 50 Mcg/act Susp (Mometasone furoate) .... Two sprays once daily 1 6)  Loratadine 10 Mg Tabs (Loratadine) .... One by mouth once daily as needed 7)  Synthroid 50 Mcg Tabs (Levothyroxine sodium) .Marland KitchenMarland KitchenMarland Kitchen  Qd 8)  Cardizem Cd 240 Mg Xr24h-cap (Diltiazem hcl coated beads) .... Once daily 9)  Benicar 40 Mg Tabs (Olmesartan medoxomil) .... One tab daily 10)  Aspirin 81 Mg Tbec (Aspirin) .... Once daily 11)  Simvastatin 20 Mg Tabs (Simvastatin) .... One tab at bedtime daily 12)  Byetta 10 Mcg Pen 10 Mcg/0.42ml Soln (Exenatide) .... Two times a day 13)  Metformin Hcl 1000 Mg Tabs (Metformin hcl) .... One by mouth bid 14)  Lantus Solostar 100 Unit/ml Soln (Insulin glargine) .Marland Kitchen.. 10 units [place] once daily 15)  Prilosec 20 Mg Cpdr (Omeprazole) .Marland Kitchen.. 1 three times a day 16)  Pepcid 20 Mg Tabs (Famotidine) .Marland Kitchen.. 1 once daily 17)  Lyrica 150 Mg Caps (Pregabalin) .... Daily two times a day 18)  Cymbalta 60 Mg Cpep (Duloxetine hcl) .... Once daily 19)  Amitriptyline Hcl 25 Mg Tabs (Amitriptyline hcl) .... At bedtime 20)  Bd U/f Short Pen Needle 31g X 8 Mm Misc (Insulin pen needle) .... Use as directed 21)  Biotin  .Marland Kitchen.. 1 by mouth daily 22)  Tessalon 200 Mg Caps (Benzonatate) .Marland Kitchen.. 1 by mouth three times a day as needed 23)  Tramadol Hcl 50 Mg Tabs (Tramadol hcl) .Marland Kitchen.. 1 by mouth every 4 hr as needed. 24)  Prednisone 10 Mg Tabs (Prednisone) .... 4 once daily for 2 days, then 3 once daily for 2 days, then 2 once daily for 2 days, 1 once daily for 2 days, then 1/2 once daily for 2 days  Other Orders: Admin 1st Vaccine ([address]) Flu Vaccine 21yrs + ([address]454) Flu  Vaccine Consent Questions     Do you have a history of severe allergic reactions to this vaccine? no    Any prior history of allergic reactions to egg and/or gelatin? no    Do you have a sensitivity to the preservative Thimersol? no    Do you have a past history of Guillan-Barre Syndrome? no    Do you currently have an acute febrile illness? no    Have you ever had a severe reaction to latex? no    Vaccine information given and explained to patient? yes    Are you currently pregnant? no    Lot Number:[id number]   Exp Date:11/04/2009   Site Given  Left Deltoid IM  Patient Instructions: 1)  Please schedule a follow-up appointment in 4 months .    .lbflu

## 2010-06-09 NOTE — Assessment & Plan Note (Signed)
Summary: 2 wk rov/njr   Vital Signs:  Patient profile:   76 year old female O2 Sat:      94 % Temp:     98.2 degrees F Pulse rate:   101 / minute BP sitting:   140 / 88  (left arm) Cuff size:   regular  Vitals Entered By: Sid Falcon LPN (July 30, 2008 3:56 PM)   History of Present Illness: Patient is seen for followup regarding her persistent coughing. Referred previous note. She had been admitted to the hospital back in February with exacerbation of asthma she eventually improved somewhat on prednisone and antibiotic although the cough never fully went away. We wondered if some of her cough may have been exacerbated by ACE inhibitor and she was switched from lisinopril to Benicar last visit. Her cough is not improved with this medication change. Her cough is mostly dry. She is having some difficulty sleeping secondary to cough. She has no dyspnea at rest. She has not had any hemoptysis or any pleuritic pain, fever, or chills. She has never smoked. She's taken Advair and Singulair regularly and using albuterol nebulizer p.r.n.  Blood sugars have been poorly controlled with fastings ranging 185-210. She is having some urine frequency and thirst. She's been off prednisone now for over a month and CPKs have remained elevated. She takes several diabetic medications including metformin, Byetta, glipizide. She has had type 2 diabetes approximately 6 years. No recent hypoglycemic symptoms.  Allergies: 1)  ! Doxycycline Hyclate (Doxycycline Hyclate) 2)  ! Avelox (Moxifloxacin Hcl) PMH reviewed for relevance, SH/Risk Factors reviewed for relevance  Review of Systems       The patient complains of dyspnea on exertion and prolonged cough.  The patient denies weight loss, chest pain, peripheral edema, headaches, and hemoptysis.    Physical Exam  General:  Patient is alert in no distress at rest. Head:  Normocephalic and atraumatic without obvious abnormalities. No apparent alopecia or  balding. Ears:  External ear exam shows no significant lesions or deformities.  Otoscopic examination reveals clear canals, tympanic membranes are intact bilaterally without bulging, retraction, inflammation or discharge. Hearing is grossly normal bilaterally. Mouth:  Oral mucosa and oropharynx without lesions or exudates.  Teeth in good repair. Neck:  supple with no adenopathy Lungs:  Patient has some mild diffuse wheezes but symmetric breath sounds. No rales. No retractions Heart:  regular rhythm and rate without murmur Extremities:  no edema or clubbing Skin:  she has a couple small urticarial lesions on her left forearm otherwise no skin lesions.   Impression & Recommendations:  Problem # 1:  ASTHMA (ICD-493.90) Patient has persistent cough most likely related to reactive airway disease. Continue Advair and Singulair and Depo-Medrol 80 mg IM is given. Consider repeat prednisone taper and possible repeat chest x-ray if not clearing in the next couple weeks. Reassess in 2 weeks' time. Her updated medication list for this problem includes:    Albuterol Sulfate (2.5 Mg/53ml) 0.083% Nebu (Albuterol sulfate) ..... Nebulizer inhalation every 6 hours as needed    Advair Diskus 250-50 Mcg/dose Misc (Fluticasone-salmeterol) ..... One puff bid    Singulair 10 Mg Tabs (Montelukast sodium) ..... Once daily  Orders: Depo- Medrol 80mg  (J1040)  Problem # 2:  DIABETES-TYPE 2 (ICD-250.00) Poorly controlled. Even though she has been on recent prednisone this has been out of her system for a few weeks now. She is on multiple medications without good control. We will stop her glipizide and initiate Lantus 10  units daily and to go over instructions for use. We'll plan to see back in a couple weeks and review at that time titration regimen for Lantus. Will probably eventually stop her Byetta as well. She will continue metformin 1000 mg b.i.d. The following medications were removed from the medication list:     Glipizide 5 Mg Tabs (Glipizide) ..... Once daily in am Her updated medication list for this problem includes:    Byetta 10 Mcg Pen 10 Mcg/0.66ml Soln (Exenatide) .Marland Kitchen..Marland Kitchen Two times a day    Novolog 100 Unit/ml Soln (Insulin aspart) ..... Before meals and at bedtime, sensitive scale    Benicar 40 Mg Tabs (Olmesartan medoxomil) ..... One tab daily    Lantus Solostar 100 Unit/ml Soln (Insulin glargine) .Marland KitchenMarland KitchenMarland KitchenMarland Kitchen 10 units [place] once daily  Problem # 3:  COUGH (ICD-786.2) Likely secondary to reactive airway disease. Orders: Depo- Medrol 80mg  (J1040) Admin of Therapeutic Inj  intramuscular or subcutaneous ([address])  Complete Medication List: 1)  Albuterol Sulfate (2.5 Mg/39ml) 0.083% Nebu (Albuterol sulfate) .... Nebulizer inhalation every 6 hours as needed 2)  Atrovent 0.5 % Soln (ipratropium Bromide)  .... Nebulized inhalation every 6 hours as needed 3)  Advair Diskus 250-50 Mcg/dose Misc (Fluticasone-salmeterol) .... One puff bid 4)  Singulair 10 Mg Tabs (Montelukast sodium) .... Once daily 5)  Synthroid 50 Mcg Tabs (Levothyroxine sodium) .... Qd 6)  Byetta 10 Mcg Pen 10 Mcg/0.88ml Soln (Exenatide) .... Two times a day 7)  Prilosec 20 Mg Cpdr (Omeprazole) .... Once daily 8)  Cvs Senna-c 8.6 Mg Tabs (Sennosides) .... 2 at bedtime 9)  Cardizem Cd 240 Mg Xr24h-cap (Diltiazem hcl coated beads) .... Once daily 10)  Novolog 100 Unit/ml Soln (Insulin aspart) .... Before meals and at bedtime, sensitive scale 11)  Benicar 40 Mg Tabs (Olmesartan medoxomil) .... One tab daily 12)  Lyrica 150 Mg Caps (Pregabalin) .... Daily two times a day 13)  Cymbalta 60 Mg Cpep (Duloxetine hcl) .... Once daily 14)  Amitriptyline Hcl 25 Mg Tabs (Amitriptyline hcl) .... At bedtime 15)  Tessalon 200 Mg Caps (Benzonatate) .... One every 8 hours as needed for cough 16)  Lantus Solostar 100 Unit/ml Soln (Insulin glargine) .Marland Kitchen.. 10 units [place] once daily 17)  Bd U/f Short Pen Needle 31g X 8 Mm Misc (Insulin pen needle) .... Use as  directed  Patient Instructions: 1)  Please schedule a follow-up appointment in 2 weeks.  2)  Stop your glipizide. Start Lantus 10 units subcutaneous once daily. For now, continue your other diabetic medications including metformin and Byetta. Prescriptions: BD U/F SHORT PEN NEEDLE 31G X 8 MM MISC (INSULIN PEN NEEDLE) use as directed  #50 x 3   Entered and Authorized by:   Evelena Peat MD   Signed by:   Evelena Peat MD on 07/30/2008   Method used:   Print then Give to Patient   RxID:   [id number] LANTUS SOLOSTAR 100 UNIT/ML SOLN (INSULIN GLARGINE) 10 units [place] once daily  #15 ml x 3   Entered and Authorized by:   Evelena Peat MD   Signed by:   Evelena Peat MD on 07/30/2008   Method used:   Print then Give to Patient   RxID:   [id number]    Medication Administration  Injection # 1:    Medication: Depo- Medrol 80mg     Diagnosis: COUGH (ICD-786.2)    Route: IM    Site: RUOQ gluteus    Exp Date: 07/06/2009    Lot #: [id number]  Mfr: Teva    Patient tolerated injection without complications    Given by: Sid Falcon LPN (July 30, 2008 4:44 PM)  Orders Added: 1)  Est. Patient Level IV [[address]] 2)  Depo- Medrol 80mg  [J1040] 3)  Admin of Therapeutic Inj  intramuscular or subcutaneous [60454]

## 2010-06-09 NOTE — Medication Information (Signed)
Summary: Prior Authorization Request and Approval for Singulair/[employer]  Prior Authorization Request and Approval for Singulair/[employer]   Imported By: Maryln Gottron 10/27/2008 14:47:22  _____________________________________________________________________  External Attachment:    Type:   Image     Comment:   External Document

## 2010-06-09 NOTE — Letter (Signed)
Summary: New Patient letter  [facility]  [address] [place], [place] [address]   Phone: [phone]  Fax: [phone]       11/22/2009 MRN: [id number]  JAVIANA ANWAR [address] [place], [place]  [address]  Dear [name],  Welcome to the Gastroenterology Division at [facility].    You are scheduled to see Dr. Russella Dar  on 01/12/2010 at 11:00am  on the 3rd floor at [facility], [address]  We ask that you try to arrive at our office 15 minutes prior to your appointment time to allow for check-in.  We would like you to complete the enclosed self-administered evaluation form prior to your visit and bring it with you on the day of your appointment.  We will review it with you.  Also, please bring a complete list of all your medications or, if you prefer, bring the medication bottles and we will list them.  Please bring your insurance card so that we may make a copy of it.  If your insurance requires a referral to see a specialist, please bring your referral form from your primary care physician.  Co-payments are due at the time of your visit and may be paid by cash, check or credit card.     Your office visit will consist of a consult with your physician (includes a physical exam), any laboratory testing he/she may order, scheduling of any necessary diagnostic testing (e.g. x-ray, ultrasound, CT-scan), and scheduling of a procedure (e.g. Endoscopy, Colonoscopy) if required.  Please allow enough time on your schedule to allow for any/all of these possibilities.    If you cannot keep your appointment, please call [phone] to cancel or reschedule prior to your appointment date.  This allows [place] the opportunity to schedule an appointment for another patient in need of care.  If you do not cancel or reschedule by 5 p.m. the business day prior to your appointment date, you will be charged a $50.00 late cancellation/no-show fee.    Thank you for choosing [facility] for your medical needs.  We appreciate the opportunity to care for you.  Please visit [place] at our website  to learn more about our practice.                     Sincerely,                                                             The Gastroenterology Division

## 2010-06-09 NOTE — Assessment & Plan Note (Signed)
Summary: POST HOSP VISIT/RCD   Vital Signs:  Patient profile:   76 year old female Weight:      141.50 pounds Temp:     98 degrees F oral BP sitting:   170 / 102  (left arm) Cuff size:   regular  Vitals Entered By: Sid Falcon LPN (September 02, 2009 11:11 AM) CC: Post hospital visit   History of Present Illness: Patient here for hospital followup.  Admitted for 3 days with infectious colitis. Patient was dehydrated on admission. Discharged on Flagyl and Cipro. Significant nausea which improved after discontinuing Flagyl a few days ago. Overall her pain is improving and almost resolved. Denies fever or chills. Only rare loose stool. No prior history of diverticulitis.  Type 2 diabetes. Patient had addition of NovoLog insulin pre-meals in hospital and Byetta was discontinued. Blood sugars generally ranging 150-200 now. No hypoglycemia. Currently on Lantus only 10 units daily. Not eating well.  Hypokalemia hospital with discharge potassium 3.1. Patient was not discharged on any potassium as far as we can tell.  History polypharmacy. Very confusing discharge with some inconsistencies in discharge summary medication list and med reconciliation list. We spent considerable time sorting through what she is actually taking at this time. She was taken off amitriptyline and Lyrica in the hospital. Also taken off Cymbalta but she feels her depressive symptoms are worsening.  Her asthma has been stable.  Allergies: 1)  ! Doxycycline Hyclate (Doxycycline Hyclate) 2)  ! Avelox (Moxifloxacin Hcl) 3)  Morphine Sulfate (Morphine Sulfate)  Past History:  Past Medical History: Last updated: 02/23/2009 Asthma      - Spirometry 11/25/08 FEV1 1.48(86%), FVC 1.85(75%), FEV1% 80      - IgE 01/29/09 was 86.5      - CXR 7/21, normal Allergic rhinitis Diverticulosis, colon GERD Hypertension Urinary incontinence Diabetes Diabetic peripheral neuropathy OSA, intolerant of CPAP  Past Surgical  History: Last updated: 11/25/2008 Appendectomy 1971 Hysterectomy  1971 Tonsillectomy Tubal ligation  Family History: Last updated: 07/15/2008 Family History of Arthritis  patent Family History Diabetes 1st degree relative  parent Family History Lung cancer  grandparent Family History of Stroke F 1st degree relative <60  grandparent Family History of Cardiovascular disorder  parent  Social History: Last updated: 07/15/2008 Retired Married Never Smoked Alcohol use-no  Risk Factors: Exercise: no (07/13/2009)  Risk Factors: Smoking Status: never (07/15/2008)  Review of Systems       The patient complains of anorexia and weight loss.  The patient denies fever, chest pain, syncope, dyspnea on exertion, peripheral edema, prolonged cough, headaches, hemoptysis, abdominal pain, melena, hematochezia, and severe indigestion/heartburn.         Some weight loss since pre-hopitalization  Physical Exam  General:  Well-developed,well-nourished,in no acute distress; alert,appropriate and cooperative throughout examination Mouth:  Oral mucosa and oropharynx without lesions or exudates.  Teeth in good repair. Neck:  No deformities, masses, or tenderness noted. Lungs:  Normal respiratory effort, chest expands symmetrically. Lungs are clear to auscultation, no crackles or wheezes. Heart:  normal rate, regular rhythm, and no gallop.   Abdomen:  soft, non-tender, normal bowel sounds, no distention, no masses, no guarding, no rigidity, no hepatomegaly, and no splenomegaly.   Extremities:  no edema Psych:  normally interactive, good eye contact, not anxious appearing, and not depressed appearing.     Impression & Recommendations:  Problem # 1:  DIVERTICULITIS, ACUTE (ICD-562.11) Assessment Improved finish out antibiotic Orders: Venipuncture (47829) TLB-CBC Platelet - w/Differential (85025-CBCD)  Problem # 2:  HYPOKALEMIA (ICD-276.8)  Orders: Venipuncture (32440) TLB-BMP (Basic  Metabolic Panel-BMET) (80048-METABOL)  Problem # 3:  DIABETES-TYPE 2 (ICD-250.00) considerable time spent reviewing diabetic meds and options for treatment. We d/ced Byetta.  Lantus 15 units/day and Novolog 5 units with breakfast and supper.  Will likely need to titrate insulin as appetite increases. The following medications were removed from the medication list:    Byetta 10 Mcg Pen 10 Mcg/0.74ml Soln (Exenatide) ..... Once daily Her updated medication list for this problem includes:    Benicar 20 Mg Tabs (Olmesartan medoxomil) ..... One by mouth daily    Aspirin 81 Mg Tbec (Aspirin) ..... Once daily    Metformin Hcl 1000 Mg Tabs (Metformin hcl) ..... One by mouth bid    Lantus Solostar 100 Unit/ml Soln (Insulin glargine) .Marland KitchenMarland KitchenMarland KitchenMarland Kitchen 15 units daily.    Aspirin 81 Mg Tabs (Aspirin) .Marland Kitchen... 2 tabs daily    Novolog Flexpen 100 Unit/ml Soln (Insulin aspart) .Marland KitchenMarland KitchenMarland KitchenMarland Kitchen 5 units with breakfast and supper  Problem # 4:  HYPERTENSION (ICD-401.9) Assessment: Deteriorated titrate Cardizem at f/u if still up. Her updated medication list for this problem includes:    Cardizem Cd 120 Mg Xr24h-cap (Diltiazem hcl coated beads) ..... Once daily    Benicar 20 Mg Tabs (Olmesartan medoxomil) ..... One by mouth daily    Carvedilol 25 Mg Tabs (Carvedilol) ..... One tab two times a day  Problem # 5:  DIABETIC PERIPHERAL NEUROPATHY (ICD-250.60) d/ced Elavil and Lyrica which she did not think were helping much and restart Cymbalta. The following medications were removed from the medication list:    Byetta 10 Mcg Pen 10 Mcg/0.97ml Soln (Exenatide) ..... Once daily Her updated medication list for this problem includes:    Benicar 20 Mg Tabs (Olmesartan medoxomil) ..... One by mouth daily    Aspirin 81 Mg Tbec (Aspirin) ..... Once daily    Metformin Hcl 1000 Mg Tabs (Metformin hcl) ..... One by mouth bid    Lantus Solostar 100 Unit/ml Soln (Insulin glargine) .Marland KitchenMarland KitchenMarland KitchenMarland Kitchen 15 units daily.    Aspirin 81 Mg Tabs (Aspirin) .Marland Kitchen... 2  tabs daily    Novolog Flexpen 100 Unit/ml Soln (Insulin aspart) .Marland KitchenMarland KitchenMarland KitchenMarland Kitchen 5 units with breakfast and supper  Complete Medication List: 1)  Atrovent 0.5 % Soln (ipratropium Bromide)  .... Nebulized inhalation every 6 hours as needed 2)  Synthroid 50 Mcg Tabs (Levothyroxine sodium) .... Qd 3)  Cardizem Cd 120 Mg Xr24h-cap (Diltiazem hcl coated beads) .... Once daily 4)  Benicar 20 Mg Tabs (Olmesartan medoxomil) .... One by mouth daily 5)  Aspirin 81 Mg Tbec (Aspirin) .... Once daily 6)  Simvastatin 20 Mg Tabs (Simvastatin) .... One tab at bedtime daily 7)  Metformin Hcl 1000 Mg Tabs (Metformin hcl) .... One by mouth bid 8)  Lantus Solostar 100 Unit/ml Soln (Insulin glargine) .Marland Kitchen.. 15 units daily. 9)  Cymbalta 60 Mg Cpep (Duloxetine hcl) .... Once daily 10)  Bd U/f Short Pen Needle 31g X 8 Mm Misc (Insulin pen needle) .... Use as directed 11)  Biotin  .Marland Kitchen.. 1 by mouth daily 12)  Aspirin 81 Mg Tabs (Aspirin) .... 2 tabs daily 13)  Carvedilol 25 Mg Tabs (Carvedilol) .... One tab two times a day 14)  Nexium 40 Mg Cpdr (Esomeprazole magnesium) .... One by mouth once daily 15)  Novolog Flexpen 100 Unit/ml Soln (Insulin aspart) .... 5 units with breakfast and supper  Patient Instructions: 1)  Please schedule a follow-up appointment in 2 weeks.  2)  follow up immediately if you have  any fever or recurrent abdominal pain

## 2010-06-09 NOTE — Assessment & Plan Note (Signed)
Summary: 3 mo rov/mm   Vital Signs:  Patient profile:   76 year old female Weight:      150 pounds Temp:     98 degrees F oral Pulse rate:   80 / minute Pulse rhythm:   regular Resp:     12 per minute BP sitting:   148 / 80  (left arm) Cuff size:   regular  Vitals Entered By: Sid Falcon LPN (November 12, 9145 10:40 AM)  O2 Flow:  Room air  Serial Vital Signs/Assessments:  Time      Position  BP       Pulse  Resp  Temp     By                     132/70                         Evelena Peat MD  CC: 3 month F/U, nagging cough ongoing   History of Present Illness: Patient is seen for followup regarding multiple items as follows. First she has history of chronic cough and a history of reported asthma. She has previously seen pulmonologist for this. She takes Singulair and Advair regularly. She has almost daily cough sometimes productive and sometimes dry. She has chronic dyspnea with minimal activities, particularly in the heat,  and this is unchanged.  No recent appetite change, weight change, hemoptysis, fever, or pleuritic pain.  Admitted last February for asthma exacerbation.  No clear ongoing triggers for asthma. We recently stopped ACE inhibitor and switched to Benicar but her cough has not improved much with this. She has never smoked but has been exposed to secondhand smoke for several years as her husband smokes.  Patient has type 2 diabetes CBGs been fairly well controlled and improved recently. She has been on titration of Lantus currently up to 64 units and we have given her previous titration regimen. She also takes Byetta and metformin.  Patient has hypothyroidism and is due for repeat lab work.  Patient compliant with all meds and denies side effects.  Allergies: 1)  ! Doxycycline Hyclate (Doxycycline Hyclate) 2)  ! Avelox (Moxifloxacin Hcl)  Past History:  Past Medical History: Last updated: 07/15/2008 Asthma Diverticulosis, colon GERD Hypertension Urinary  incontinence Diabetes Chronic bronchitis diabetic peripheral neuropathy  Past Surgical History: Last updated: 07/15/2008 Appendectomy 1971 Hysterectomy  1971 Tonsillectomy  Social History: Last updated: 07/15/2008 Retired Married Never Smoked Alcohol use-no  Review of Systems  The patient denies anorexia, fever, weight loss, weight gain, chest pain, peripheral edema, headaches, hemoptysis, and abdominal pain.    Physical Exam  General:  Well-developed,well-nourished,in no acute distress; alert,appropriate and cooperative throughout examination Head:  Normocephalic and atraumatic without obvious abnormalities. No apparent alopecia or balding. Ears:  External ear exam shows no significant lesions or deformities.  Otoscopic examination reveals clear canals, tympanic membranes are intact bilaterally without bulging, retraction, inflammation or discharge. Hearing is grossly normal bilaterally. Mouth:  Oral mucosa and oropharynx without lesions or exudates.  Teeth in good repair. Neck:  No deformities, masses, or tenderness noted. Lungs:  patient's lungs are absent much clearer on exam today and they have been previously. Heart:  regular rhythm and rate Extremities:  no edema.  No foot lesions or callous. Neurologic:  normal sensory fxn in feet.   Impression & Recommendations:  Problem # 1:  ASTHMA (ICD-493.90) She has chronic cough and has previously seen pulmonologist and we'll  refer back for f/u. Her updated medication list for this problem includes:    Albuterol Sulfate (2.5 Mg/39ml) 0.083% Nebu (Albuterol sulfate) ..... Nebulizer inhalation every 6 hours as needed    Advair Diskus 250-50 Mcg/dose Misc (Fluticasone-salmeterol) ..... One puff bid    Singulair 10 Mg Tabs (Montelukast sodium) ..... Once daily    Prednisone 10 Mg Tabs (Prednisone) .Marland Kitchen... Taper as follows:  6-6-4-4-3-3-2-1  Orders: Pulmonary Referral (Pulmonary)  Problem # 2:  DIABETES-TYPE 2  (ICD-250.00) Assessment: Improved Improving control with titration of Lantus.   Reassess A1C today. Her updated medication list for this problem includes:    Byetta 10 Mcg Pen 10 Mcg/0.61ml Soln (Exenatide) .Marland Kitchen..Marland Kitchen Two times a day    Novolog 100 Unit/ml Soln (Insulin aspart) ..... Before meals and at bedtime, sensitive scale    Benicar 40 Mg Tabs (Olmesartan medoxomil) ..... One tab daily    Lantus Solostar 100 Unit/ml Soln (Insulin glargine) .Marland KitchenMarland KitchenMarland KitchenMarland Kitchen 10 units [place] once daily    Metformin Hcl 1000 Mg Tabs (Metformin hcl) ..... One by mouth bid    Aspirin 81 Mg Tbec (Aspirin) ..... Once daily  Orders: Venipuncture (16109) TLB-Lipid Panel (80061-LIPID) TLB-A1C / Hgb A1C (Glycohemoglobin) (83036-A1C)  Problem # 3:  HYPERTENSION (ICD-401.9)  Her updated medication list for this problem includes:    Cardizem Cd 240 Mg Xr24h-cap (Diltiazem hcl coated beads) ..... Once daily    Benicar 40 Mg Tabs (Olmesartan medoxomil) ..... One tab daily  Orders: Venipuncture (60454) TLB-BMP (Basic Metabolic Panel-BMET) (80048-METABOL)  Problem # 4:  HYPOTHYROIDISM (ICD-244.9) Assessment: Unchanged  Her updated medication list for this problem includes:    Synthroid 50 Mcg Tabs (Levothyroxine sodium) ..... Qd  Orders: Venipuncture (09811) TLB-TSH (Thyroid Stimulating Hormone) (84443-TSH)  Complete Medication List: 1)  Albuterol Sulfate (2.5 Mg/24ml) 0.083% Nebu (Albuterol sulfate) .... Nebulizer inhalation every 6 hours as needed 2)  Atrovent 0.5 % Soln (ipratropium Bromide)  .... Nebulized inhalation every 6 hours as needed 3)  Advair Diskus 250-50 Mcg/dose Misc (Fluticasone-salmeterol) .... One puff bid 4)  Singulair 10 Mg Tabs (Montelukast sodium) .... Once daily 5)  Synthroid 50 Mcg Tabs (Levothyroxine sodium) .... Qd 6)  Byetta 10 Mcg Pen 10 Mcg/0.43ml Soln (Exenatide) .... Two times a day 7)  Prilosec 20 Mg Cpdr (Omeprazole) .... Once daily 8)  [facility] Senna-c 8.6 Mg Tabs (Sennosides) .... 2 at  bedtime 9)  Cardizem Cd 240 Mg Xr24h-cap (Diltiazem hcl coated beads) .... Once daily 10)  Novolog 100 Unit/ml Soln (Insulin aspart) .... Before meals and at bedtime, sensitive scale 11)  Benicar 40 Mg Tabs (Olmesartan medoxomil) .... One tab daily 12)  Lyrica 150 Mg Caps (Pregabalin) .... Daily two times a day 13)  Cymbalta 60 Mg Cpep (Duloxetine hcl) .... Once daily 14)  Amitriptyline Hcl 25 Mg Tabs (Amitriptyline hcl) .... At bedtime 15)  Tessalon 200 Mg Caps (Benzonatate) .... One every 8 hours as needed for cough 16)  Lantus Solostar 100 Unit/ml Soln (Insulin glargine) .Marland Kitchen.. 10 units [place] once daily 17)  Bd U/f Short Pen Needle 31g X 8 Mm Misc (Insulin pen needle) .... Use as directed 18)  Metformin Hcl 1000 Mg Tabs (Metformin hcl) .... One by mouth bid 19)  Azithromycin 250 Mg Tabs (Azithromycin) .... 2 by mouth today, then 1 once daily for 4 days 20)  Prednisone 10 Mg Tabs (Prednisone) .... Taper as follows:  6-6-4-4-3-3-2-1 21)  Aspirin 81 Mg Tbec (Aspirin) .... Once daily  Patient Instructions: 1)  Please schedule a follow-up  appointment in 3 months .  Prescriptions: ADVAIR DISKUS 250-50 MCG/DOSE MISC (FLUTICASONE-SALMETEROL) one puff BID  #1 x 11   Entered and Authorized by:   Evelena Peat MD   Signed by:   Evelena Peat MD on 11/12/2008   Method used:   [facility] to        [facility]  [place] [address] [facility]ret[facility]       [address] [place] [address]       [place], [place]  [address]       Ph: [phone] or [phone]       Fax: [phone]   RxID:   [id number]

## 2010-06-09 NOTE — Progress Notes (Signed)
Summary: Requesting refill of cough syrup  Phone Note Call from Patient   Caller: Patient Call For: Evelena Peat MD Summary of Call: Pt requesting refill of the cough syrup given at OV 10/12,  I'm still coughing like crazy". Initial call taken by: Sid Falcon LPN,  March 29, 2010 2:17 PM  Follow-up for Phone Call        OK to refill once. Follow-up by: Evelena Peat MD,  March 29, 2010 3:02 PM  Additional Follow-up for Phone Call Additional follow up Details #1::        Rx called in, pt informed Additional Follow-up by: Sid Falcon LPN,  March 29, 2010 4:18 PM    Prescriptions: HYDROCODONE-HOMATROPINE 5-1.5 MG/5ML SYRP (HYDROCODONE-HOMATROPINE) one tsp by mouth q 4-6 hours as needed cough  #120 ml x 0   Entered by:   Sid Falcon LPN   Authorized by:   Evelena Peat MD   Signed by:   Sid Falcon LPN on 81/19/1478   Method used:   Telephoned to ...       [facility]  [place] [address] (retail)       [address]       [place], [place]  [address]       Ph: [phone] or [phone]       Fax: [phone]   RxID:   [id number]

## 2010-06-09 NOTE — Assessment & Plan Note (Signed)
Summary: 2 wk rov/njr   Vital Signs:  Patient profile:   76 year old female Weight:      144 pounds O2 Sat:      95 % Pulse rate:   88 / minute Resp:     16 per minute BP sitting:   120 / 82  Vitals Entered By: Lynann Beaver CMA (August 13, 2008 4:28 PM) CC: 2 week follow up Is Patient Diabetic? Yes  Pain Assessment Patient in pain? no        History of Present Illness: Patient here for followup regarding asthma and type 2 diabetes. Diabetes and poorly controlled and we recently stopped her glipizide and started low-dose Lantus 10 units daily. Her CBGs have been ranging between 150 and 260.  She also remains on Byetta and metformin. She was given Depo-Medrol last visit and her cough is slightly improved though she continues to have mostly nonproductive cough and some intermittent wheezing. She's taken Advair regularly one puff b.i.d. along with Singulair and has albuterol and Atrovent nebulizer. She has not had any recent hemoptysis, fever, chills, or any pleuritic pain.  Allergies: 1)  ! Doxycycline Hyclate (Doxycycline Hyclate) 2)  ! Avelox (Moxifloxacin Hcl)  Past History:  Past Medical History:    Asthma    Diverticulosis, colon    GERD    Hypertension    Urinary incontinence    Diabetes    Chronic bronchitis    diabetic peripheral neuropathy (07/15/2008)  Social History:    Retired    Married    Never Smoked    Alcohol use-no     (07/15/2008)  Review of Systems      See HPI  Physical Exam  General:  patient is alert coughing off and on and her exam but in no respiratory distress Head:  Normocephalic and atraumatic without obvious abnormalities. No apparent alopecia or balding. Mouth:  Oral mucosa and oropharynx without lesions or exudates.  Teeth in good repair. Neck:  No deformities, masses, or tenderness noted. Lungs:  patient has some diffuse wheezes but no rales. No retractions. Heart:  Normal rate and regular rhythm. S1 and S2 normal without gallop,  murmur, click, rub or other extra sounds. Extremities:  no edema   Impression & Recommendations:  Problem # 1:  DIABETES-TYPE 2 (ICD-250.00) poorly controlled. I gave her titration regimen of increasing Lantus 2 units every 2 days until her fasting CBGs were consistently less than 130.  Schedule followup within 2 months and reassess hemoglobin A1c then Her updated medication list for this problem includes:    Byetta 10 Mcg Pen 10 Mcg/0.25ml Soln (Exenatide) .Marland Kitchen..Marland Kitchen Two times a day    Novolog 100 Unit/ml Soln (Insulin aspart) ..... Before meals and at bedtime, sensitive scale    Benicar 40 Mg Tabs (Olmesartan medoxomil) ..... One tab daily    Lantus Solostar 100 Unit/ml Soln (Insulin glargine) .Marland KitchenMarland KitchenMarland KitchenMarland Kitchen 10 units [place] once daily  Problem # 2:  ASTHMA (ICD-493.90) Prednisone taper prescribed. She is aware this may exacerbate CBGs.  Consider pulmonary consult if cough persists. Her updated medication list for this problem includes:    Albuterol Sulfate (2.5 Mg/16ml) 0.083% Nebu (Albuterol sulfate) ..... Nebulizer inhalation every 6 hours as needed    Advair Diskus 250-50 Mcg/dose Misc (Fluticasone-salmeterol) ..... One puff bid    Singulair 10 Mg Tabs (Montelukast sodium) ..... Once daily    Prednisone 10 Mg Tabs (Prednisone) .Marland Kitchen... Taper as follows:  5-5-4-4-3-3-2-1  Complete Medication List: 1)  Albuterol Sulfate (  2.5 Mg/40ml) 0.083% Nebu (Albuterol sulfate) .... Nebulizer inhalation every 6 hours as needed 2)  Atrovent 0.5 % Soln (ipratropium Bromide)  .... Nebulized inhalation every 6 hours as needed 3)  Advair Diskus 250-50 Mcg/dose Misc (Fluticasone-salmeterol) .... One puff bid 4)  Singulair 10 Mg Tabs (Montelukast sodium) .... Once daily 5)  Synthroid 50 Mcg Tabs (Levothyroxine sodium) .... Qd 6)  Byetta 10 Mcg Pen 10 Mcg/0.55ml Soln (Exenatide) .... Two times a day 7)  Prilosec 20 Mg Cpdr (Omeprazole) .... Once daily 8)  [facility] Senna-c 8.6 Mg Tabs (Sennosides) .... 2 at bedtime 9)  Cardizem Cd  240 Mg Xr24h-cap (Diltiazem hcl coated beads) .... Once daily 10)  Novolog 100 Unit/ml Soln (Insulin aspart) .... Before meals and at bedtime, sensitive scale 11)  Benicar 40 Mg Tabs (Olmesartan medoxomil) .... One tab daily 12)  Lyrica 150 Mg Caps (Pregabalin) .... Daily two times a day 13)  Cymbalta 60 Mg Cpep (Duloxetine hcl) .... Once daily 14)  Amitriptyline Hcl 25 Mg Tabs (Amitriptyline hcl) .... At bedtime 15)  Tessalon 200 Mg Caps (Benzonatate) .... One every 8 hours as needed for cough 16)  Lantus Solostar 100 Unit/ml Soln (Insulin glargine) .Marland Kitchen.. 10 units [place] once daily 17)  Bd U/f Short Pen Needle 31g X 8 Mm Misc (Insulin pen needle) .... Use as directed 18)  Prednisone 10 Mg Tabs (Prednisone) .... Taper as follows:  5-5-4-4-3-3-2-1  Patient Instructions: 1)  Titrate Lantus as follows:  increase by 2 units every 2 days until fasting glucose is consistently less than 130. 2)  Check your blood sugars regularly. If your readings are usually above:  or below 70 you should contact our office.  3)  It is important that your diabetic A1c level is checked every 3 months.  4)  Please schedule a follow-up appointment in 2 months.  Prescriptions: TESSALON 200 MG CAPS (BENZONATATE) one every 8 hours as needed for cough  #30 x 1   Entered and Authorized by:   Evelena Peat MD   Signed by:   Evelena Peat MD on 08/13/2008   Method used:   [facility]        [facility]  [place] [address]       [address] [place] [address]       [place], [place]  [address]       Ph: [phone] or [phone]       Fax: [phone]   RxID:   [id number] PREDNISONE 10 MG TABS (PREDNISONE) taper as follows:  5-5-4-4-3-3-2-1  #27 x 0   Entered and Authorized by:   Evelena Peat MD   Signed by:   Evelena Peat MD on 08/13/2008   Method used:   [facility]        [facility]  [place] [address]       [address] [place] [address]       [place], [place]  [address]       Ph: [phone] or [phone]       Fax:  [phone]   RxID:   [id number]

## 2010-06-09 NOTE — Assessment & Plan Note (Signed)
Summary: bronchitis and asthma/cjr   Vital Signs:  Patient profile:   76 year old female Weight:      148 pounds O2 Sat:      94 % on Room air Temp:     98.1 degrees F oral Pulse rate:   96 / minute BP sitting:   140 / 90  (left arm) Cuff size:   regular  Vitals Entered By: Sid Falcon LPN (October 15, 2008 4:26 PM)  O2 Flow:  Room air CC: Bronchitis, cough, asthma flare up, SOB wheezing   History of Present Illness: Patient says a work in with one-week history of cough and wheezing. She has history of asthma and takes Advair regularly. She is home nebulizer which has been using frequent past couple days. Cough is productive of yellow sputum. No fevers or chills. Patient is nonsmoker. She has type 2 diabetes and is on Lantus as well as metformin.  Allergies: 1)  ! Doxycycline Hyclate (Doxycycline Hyclate) 2)  ! Avelox (Moxifloxacin Hcl)  Past History:  Past Medical History: Last updated: 07/15/2008 Asthma Diverticulosis, colon GERD Hypertension Urinary incontinence Diabetes Chronic bronchitis diabetic peripheral neuropathy  Review of Systems      See HPI  Physical Exam  General:  Well-developed,well-nourished,in no acute distress; alert,appropriate and cooperative throughout examination Ears:  External ear exam shows no significant lesions or deformities.  Otoscopic examination reveals clear canals, tympanic membranes are intact bilaterally without bulging, retraction, inflammation or discharge. Hearing is grossly normal bilaterally. Mouth:  Oral mucosa and oropharynx without lesions or exudates.  Teeth in good repair. Neck:  No deformities, masses, or tenderness noted. Lungs:  patient has some faint expiratory wheezes anteriorly no rales symmetric breath sounds. She is noted to have some pseudo-wheezing. No retractions. No tachypnea. Heart:  Normal rate and regular rhythm. S1 and S2 normal without gallop, murmur, click, rub or other extra sounds.   Impression &  Recommendations:  Problem # 1:  ASTHMA (ICD-493.90) exacerbation.  antibiotic given with duration of productive cough and prednisone taper. Continue home nebulizer. The following medications were removed from the medication list:    Prednisone 10 Mg Tabs (Prednisone) .Marland Kitchen... Taper as follows:  5-5-4-4-3-3-2-1 Her updated medication list for this problem includes:    Albuterol Sulfate (2.5 Mg/81ml) 0.083% Nebu (Albuterol sulfate) ..... Nebulizer inhalation every 6 hours as needed    Advair Diskus 250-50 Mcg/dose Misc (Fluticasone-salmeterol) ..... One puff bid    Singulair 10 Mg Tabs (Montelukast sodium) ..... Once daily    Prednisone 10 Mg Tabs (Prednisone) .Marland Kitchen... Taper as follows:  6-6-4-4-3-3-2-1  Complete Medication List: 1)  Albuterol Sulfate (2.5 Mg/36ml) 0.083% Nebu (Albuterol sulfate) .... Nebulizer inhalation every 6 hours as needed 2)  Atrovent 0.5 % Soln (ipratropium Bromide)  .... Nebulized inhalation every 6 hours as needed 3)  Advair Diskus 250-50 Mcg/dose Misc (Fluticasone-salmeterol) .... One puff bid 4)  Singulair 10 Mg Tabs (Montelukast sodium) .... Once daily 5)  Synthroid 50 Mcg Tabs (Levothyroxine sodium) .... Qd 6)  [place] 10 Mcg Pen 10 Mcg/0.69ml Soln (Exenatide) .... Two times a day 7)  Prilosec 20 Mg Cpdr (Omeprazole) .... Once daily 8)  [facility]vs Senna-c 8.6 Mg Tabs (Sennosides) .... 2 at bedtime 9)  Cardizem Cd 240 Mg Xr24h-cap (Diltiazem hcl coated beads) .... Once daily 10)  Novolog 100 Unit/ml Soln (Insulin aspart) .... Before meals and at bedtime, sensitive scale 11)  Benicar 40 Mg Tabs (Olmesartan medoxomil) .... One tab daily 12)  Lyrica 150 Mg Caps (Pregabalin) .Marland KitchenMarland KitchenMarland Kitchen  Daily two times a day 13)  Cymbalta 60 Mg Cpep (Duloxetine hcl) .... Once daily 14)  Amitriptyline Hcl 25 Mg Tabs (Amitriptyline hcl) .... At bedtime 15)  Tessalon 200 Mg Caps (Benzonatate) .... One every 8 hours as needed for cough 16)  Lantus Solostar 100 Unit/ml Soln (Insulin glargine) .Marland Kitchen.. 10 units [place]  once daily 17)  Bd U/f Short Pen Needle 31g X 8 Mm Misc (Insulin pen needle) .... Use as directed 18)  Metformin Hcl 1000 Mg Tabs (Metformin hcl) .... One by mouth bid 19)  Azithromycin 250 Mg Tabs (Azithromycin) .... 2 by mouth today, then 1 once daily for 4 days 20)  Prednisone 10 Mg Tabs (Prednisone) .... Taper as follows:  6-6-4-4-3-3-2-1  Patient Instructions: 1)  Please schedule a follow-up appointment in 2 months.  Prescriptions: SINGULAIR 10 MG TABS (MONTELUKAST SODIUM) once daily  #30 x 11   Entered and Authorized by:   Evelena Peat MD   Signed by:   Evelena Peat MD on 10/15/2008   Method used:   Electronically to        [facility]  [place] [address] (retail)       [address]       [place], [place]  [address]       Ph: [phone] or [phone]       Fax: [phone]   RxID:   [id number] PREDNISONE 10 MG TABS (PREDNISONE) taper as follows:  6-6-4-4-3-3-2-1  #29 x 0   Entered and Authorized by:   Evelena Peat MD   Signed by:   Evelena Peat MD on 10/15/2008   Method used:   Electronically to        [facility]  [place] [address] (retail)       [address]       [place], [place]  [address]       Ph: [phone] or [phone]       Fax: [phone]   RxID:   [id number] AZITHROMYCIN 250 MG TABS (AZITHROMYCIN) 2 by mouth today, then 1 once daily for 4 days  #6 x 0   Entered and Authorized by:   Evelena Peat MD   Signed by:   Evelena Peat MD on 10/15/2008   Method used:   [facility] to        [facility]  [place] [address] (retail[facility]       [address]       [place], [place]  [address]       Ph: [phone] or [phone]       Fax: [phone]   RxID:   [id number]

## 2010-06-09 NOTE — Letter (Signed)
Summary: Hermelinda Medicus, MD  Hermelinda Medicus, MD   Imported By: Maryln Gottron 05/04/2009 11:26:16  _____________________________________________________________________  External Attachment:    Type:   Image     Comment:   External Document

## 2010-06-09 NOTE — Letter (Signed)
Summary: Consult/[facility]   Imported By: Sherian Rein 09/08/2009 14:06:41  _____________________________________________________________________  External Attachment:    Type:   Image     Comment:   External Document

## 2010-06-09 NOTE — Assessment & Plan Note (Signed)
Summary: 3 MONTH ROV/NJR   Vital Signs:  Patient profile:   76 year old female Weight:      156 pounds Temp:     98.4 degrees F oral BP sitting:   140 / 84  (left arm) Cuff size:   regular  Vitals Entered By: Sid Falcon LPN (March 25, 2010 11:00 AM)  History of Present Illness: Patient here for followup several items.  Type 2 diabetes. Recent A1c 8.1%. Titration of Lantus. No hypoglycemia. Inconsistent exercise. Fasting blood sugars have been excellent mostly low 100s. No postprandial checks.  She has several scaly pruritic skin lesions which she would like to have treated mostly right lower back and one left upper chest wall area.    She complains of fatigue issues. Has hypothyroidism and takes Synthroid consistently. No other recent med changes other than titration of Lantus. Sleeping fairly well. No appetite changes.  Hypertension History:      She denies headache, chest pain, palpitations, dyspnea with exertion, orthopnea, PND, peripheral edema, visual symptoms, neurologic problems, syncope, and side effects from treatment.  She notes no problems with any antihypertensive medication side effects.        Positive major cardiovascular risk factors include female age 20 years old or older, diabetes, hyperlipidemia, and hypertension.  Negative major cardiovascular risk factors include non-tobacco-user status.        Further assessment for target organ damage reveals no history of ASHD, stroke/TIA, or peripheral vascular disease.     Allergies: 1)  ! Doxycycline Hyclate (Doxycycline Hyclate) 2)  ! Avelox (Moxifloxacin Hcl) 3)  Morphine Sulfate (Morphine Sulfate)  Past History:  Past Medical History: Last updated: 09/07/2009 Asthma      - Spirometry 11/25/08 FEV1 1.48(86%), FVC 1.85(75%), FEV1% 80      - IgE 01/29/09 was 86.5      - CXR 7/21, normal Allergic rhinitis Diverticulosis, colon GERD Hypertension Urinary incontinence Diabetes Diabetic peripheral  neuropathy OSA, intolerant of CPAP Hypothyroidism Diverticulosis Hemorrhoids Hyperplastic polyps 01/2005  Past Surgical History: Last updated: 01/12/2010 Appendectomy 1971 Hysterectomy  1971 Tonsillectomy Tubal ligation C-section Sigmoid colectomy for diverticulitis Laparotomy x 3 with adhesions lysis Right foot surgery x 2  Family History: Last updated: 09/07/2009 Family History of Arthritis  patent Family History Diabetes 1st degree relative  parent Family History Lung cancer  grandparent Family History of Stroke F 1st degree relative <60  grandparent Family History of Cardiovascular disorder  parent Family History of Celiac Disease:Sister  Social History: Last updated: 07/15/2008 Retired Married Never Smoked Alcohol use-no  Risk Factors: Exercise: no (07/13/2009)  Risk Factors: Smoking Status: never (07/15/2008) PMH-FH-SH reviewed for relevance  Review of Systems  The patient denies anorexia, fever, weight loss, vision loss, chest pain, syncope, dyspnea on exertion, peripheral edema, prolonged cough, headaches, hemoptysis, abdominal pain, melena, hematochezia, severe indigestion/heartburn, muscle weakness, and depression.    Physical Exam  General:  Well-developed,well-nourished,in no acute distress; alert,appropriate and cooperative throughout examination Head:  Normocephalic and atraumatic without obvious abnormalities. No apparent alopecia or balding. Eyes:  pupils equal, pupils round, and pupils reactive to light.   Ears:  External ear exam shows no significant lesions or deformities.  Otoscopic examination reveals clear canals, tympanic membranes are intact bilaterally without bulging, retraction, inflammation or discharge. Hearing is grossly normal bilaterally. Mouth:  Oral mucosa and oropharynx without lesions or exudates.  Teeth in good repair. Neck:  No deformities, masses, or tenderness noted. Lungs:  Normal respiratory effort, chest expands  symmetrically. Lungs are clear to  auscultation, no crackles or wheezes. Heart:  normal rate and regular rhythm.   Extremities:  No clubbing, cyanosis, edema, or deformity noted with normal full range of motion of all joints.   Skin:  patient has several slightly raised well-demarcated scaly skin lesions consistent with seborrheic keratoses with 3 on  her right lower lumbar area, one left upper chest wall. No atypical features Cervical Nodes:  No lymphadenopathy noted Psych:  normally interactive, good eye contact, not anxious appearing, and not depressed appearing.     Impression & Recommendations:  Problem # 1:  HYPOTHYROIDISM (ICD-244.9)  Her updated medication list for this problem includes:    Synthroid 50 Mcg Tabs (Levothyroxine sodium) ..... Once daily  Orders: Venipuncture (56213) Specimen Handling (08657) TLB-TSH (Thyroid Stimulating Hormone) (84443-TSH)  Problem # 2:  DIABETES-TYPE 2 (ICD-250.00)  Her updated medication list for this problem includes:    Benicar 20 Mg Tabs (Olmesartan medoxomil) ..... One by mouth daily    Aspirin 81 Mg Tbec (Aspirin) ..... Once daily    Metformin Hcl 1000 Mg Tabs (Metformin hcl) ..... One by mouth bid    Lantus Solostar 100 Unit/ml Soln (Insulin glargine) .Marland Kitchen... 90 units daily    Novolog Flexpen 100 Unit/ml Soln (Insulin aspart) .Marland Kitchen... 9 units with breakfast and supper, 5 units at lunchtime  Orders: Venipuncture (84696) Specimen Handling (29528) TLB-A1C / Hgb A1C (Glycohemoglobin) (83036-A1C)  Problem # 3:  SEBORRHEIC KERATOSIS (ICD-702.19) discussed risks and benefits of treatment of seb keratoses with liquid N and pt consented.  She tolerated well. Orders: Cryotherapy/Destruction benign or premalignant lesion (1st lesion)  (17000) Cryotherapy/Destruction benign or premalignant lesion (2nd-14th lesions) (17003)  Problem # 4:  HYPERTENSION (ICD-401.9)  Her updated medication list for this problem includes:    Cardizem Cd 120 Mg  Xr24h-cap (Diltiazem hcl coated beads) ..... Once daily    Benicar 20 Mg Tabs (Olmesartan medoxomil) ..... One by mouth daily    Carvedilol 25 Mg Tabs (Carvedilol) ..... One tab two times a day  Complete Medication List: 1)  Synthroid 50 Mcg Tabs (Levothyroxine sodium) .... Once daily 2)  Cardizem Cd 120 Mg Xr24h-cap (Diltiazem hcl coated beads) .... Once daily 3)  Benicar 20 Mg Tabs (Olmesartan medoxomil) .... One by mouth daily 4)  Aspirin 81 Mg Tbec (Aspirin) .... Once daily 5)  Simvastatin 20 Mg Tabs (Simvastatin) .... One tab at bedtime daily 6)  Metformin Hcl 1000 Mg Tabs (Metformin hcl) .... One by mouth bid 7)  Lantus Solostar 100 Unit/ml Soln (Insulin glargine) .... 90 units daily 8)  Cymbalta 60 Mg Cpep (Duloxetine hcl) .... Once daily 9)  Bd U/f Short Pen Needle 31g X 8 Mm Misc (Insulin pen needle) .... Use as directed 10)  Biotin  .Marland Kitchen.. 1 by mouth daily 11)  Carvedilol 25 Mg Tabs (Carvedilol) .... One tab two times a day 12)  Nexium 40 Mg Cpdr (Esomeprazole magnesium) .... One by mouth once daily 13)  Novolog Flexpen 100 Unit/ml Soln (Insulin aspart) .... 9 units with breakfast and supper, 5 units at lunchtime 14)  Amitriptyline Hcl 25 Mg Tabs (Amitriptyline hcl) .... One tab at bedtime 15)  Niaspan 500 Mg Cr-tabs (Niacin (antihyperlipidemic)) .... Two by mouth once daily 16)  Align Caps (Probiotic product) .... One capsule by mouth once daily 17)  Hydrocodone-homatropine 5-1.5 Mg/74ml Syrp (Hydrocodone-homatropine) .... One tsp by mouth q 4-6 hours as needed cough  Hypertension Assessment/Plan:      The patient's hypertensive risk group is category C: Target organ  damage and/or diabetes.  Today's blood pressure is 140/84.  Her blood pressure goal is < 130/80.  Patient Instructions: 1)  Please schedule a follow-up appointment in 3 months .  2)  It is important that you exercise reguarly at least 20 minutes 5 times a week. If you develop chest pain, have severe difficulty  breathing, or feel very tired, stop exercising immediately and seek medical attention.  3)  You need to lose weight. Consider a lower calorie diet and regular exercise.    Orders Added: 1)  Venipuncture [40981] 2)  Specimen Handling [99000] 3)  TLB-TSH (Thyroid Stimulating Hormone) [84443-TSH] 4)  TLB-A1C / Hgb A1C (Glycohemoglobin) [83036-A1C] 5)  Est. Patient Level IV [19147] 6)  Cryotherapy/Destruction benign or premalignant lesion (1st lesion)  [17000] 7)  Cryotherapy/Destruction benign or premalignant lesion (2nd-14th lesions) [17003]

## 2010-06-20 ENCOUNTER — Other Ambulatory Visit: Payer: Self-pay | Admitting: Family Medicine

## 2010-06-20 MED ORDER — METFORMIN HCL 1000 MG PO TABS: 1000.0000 mg | ORAL_TABLET | Freq: Two times a day (BID) | ORAL | Status: DC

## 2010-06-22 ENCOUNTER — Other Ambulatory Visit: Payer: Self-pay | Admitting: Family Medicine

## 2010-06-24 ENCOUNTER — Encounter: Payer: Self-pay | Admitting: Family Medicine

## 2010-06-24 ENCOUNTER — Ambulatory Visit: Payer: Self-pay | Admitting: Family Medicine

## 2010-06-24 ENCOUNTER — Ambulatory Visit (INDEPENDENT_AMBULATORY_CARE_PROVIDER_SITE_OTHER): Payer: Medicare FFS | Admitting: Family Medicine

## 2010-06-24 DIAGNOSIS — E785 Hyperlipidemia, unspecified: Secondary | ICD-10-CM

## 2010-06-24 DIAGNOSIS — E119 Type 2 diabetes mellitus without complications: Secondary | ICD-10-CM

## 2010-06-24 DIAGNOSIS — E1149 Type 2 diabetes mellitus with other diabetic neurological complication: Secondary | ICD-10-CM

## 2010-06-24 DIAGNOSIS — I10 Essential (primary) hypertension: Secondary | ICD-10-CM

## 2010-06-24 NOTE — Progress Notes (Signed)
  Subjective:    Patient ID: Casey Harrell, female    DOB: 12-23-1934, 76 y.o.   MRN: [id number]  HPI  patient seen for medical followup. Type 2 diabetes with suboptimal control. We increased her insulin since last visit and she is titrated up to 140 units daily of Lantus also takes meal time short acting insulin. No hypoglycemic symptoms. Has had some weight gain since last visit. Good appetite. No symptoms of hyperglycemia other than dry mouth she has some chronic peripheral neuropathy issues.   History of peripheral vascular disease with previous right carotid endarterectomy. Hyperlipidemia. Needs followup labs.   History of asthma which has been stable. Recent viral illness but no respiratory concerns   Review of Systems Some recent weight gain.  No headaches, fever, chest pain, chronic cough, pleuritic pain, abdominal pain, dysuria, stool changes.    Objective:   Physical Exam  patient is alert and in no distress Oropharynx no lesions. Slightly dry tongue. New line or gums normal Neck supple no adenopathy Chest clear to auscultation Heart regular rhythm and rate  Extremities no foot lesions. No edema.  Feet warm to touch with good capillary refill.       Assessment & Plan:  #1  Type 2 diabetes with poor control previously.  Reassess A1C. #2  Hyperlipidemia.  Repeat lipids. #3  Asthma stable.

## 2010-06-24 NOTE — Patient Instructions (Signed)
Calorie Counting Diet   A calorie counting diet requires you to eat the number of calories that are right for you during a day. Calories are the measurement of how much energy you get from the food you eat. Eating the right amount of calories is important for staying at a healthy weight. If you eat too many calories your body will store them as fat and you may gain weight. If you eat too few calories you may lose weight. Counting the number of calories that you eat during a day will help you to know if you're eating the right amount. A Registered Dietitian can determine how many calories you need in a day. The amount of calories you need varies from person to person.   If your goal is to lose weight you will need to eat fewer calories. Losing weight can benefit you if you are overweight or have health problems such as heart disease, high blood pressure or diabetes. If your goal is to gain weight, you will need to eat more calories. Gaining weight may be necessary if you have a certain health problem that causes your body to need more energy.   TIPS Whether you are increasing or decreasing the number of calories you eat during a day, it may be hard to get used to changing what you eat and drink. The following are tips to help you keep track of the number of calories you are eating.    Measuring foods at home with measuring cups will help you to know the actual amount of food and number of calories you are eating.    Restaurants serve food in all different portion sizes. It is common that restaurants will serve food in amounts worth 2 or more serving sizes. While eating out, it may be helpful to estimate how many servings of a food you are given. For example, a serving of cooked rice is 1/2 cup and that is the size of half of a fist. Knowing serving sizes will help you have a better idea of how much food you are eating at restaurants.  Ask for smaller portion sizes or child-size portions at  restaurants.  Plan to eat half of a meal at a restaurant and take the rest home or share the other half with a friend  Read food labels for calorie content and serving size l Most packaged food has a Nutrition Facts Panel on its side or back. Here you can find out how many servings are in a package, the size of a serving, and the number of calories each serving has.    The serving size and number of servings per container are listed right below the Nutrition Facts heading.  Just below the serving information, the number of calories in each serving is listed.   l For example, say that a package has three cookies inside. The Nutrition Facts panel says that one serving is one cookie. Below that, it says that there are three servings in the container. The calories section of the Nutrition Facts says there are 90 calories. That means that there are 90 calories in one cookie. If you eat one cookie you have eaten 90 calories. If you eat all three cookies, you have eaten three times that amount, or 270 calories.       The list below tells you how big or small some common portion sizes are.  1 ounce (oz).................4 stacked dice.  3 oz.............................Marland KitchenDeck of cards.  1 teaspoon (tsp)..........Marland KitchenTip of  little finger.  1 tablespoon (Tbsp).Marland KitchenMarland KitchenMarland KitchenTip of thumb.  2 Tbsp.........................Marland KitchenGolf ball.   Cup.........................Marland KitchenHalf of a fist.  1 Cup..........................Marland KitchenA fist.     KEEP A FOOD LOG Write down every food item that you eat, how much of the food you eat, and the number of calories in each food that you eat during the day.  At the end of the day or throughout the day you can add up the total number of calories you have eaten.     It may help to set up a list like the one below.  Find out the calorie information by reading food labels.  Breakfast l Bran Flakes (1 cup, 110 calories). l Fat free milk ( cup, 45 calories).  Snack l Apple (1 medium, 80  calories).  Lunch l Spinach (1 cup, 20 calories). l Tomato ( medium, 20 calories). l Chicken breast strips (3 oz, 165 calories). l Shredded cheddar cheese ( cup, 110 calories). l Light [place] dressing (2 Tbsp, 60 calories). l Whole wheat bread (1 slice, 80 calories). l Tub margarine (1 tsp, 35 calories).  l Vegetable soup (1 cup, 160 calories).  Dinner l Pork chop (3 oz, 190 calories). l Brown rice (1 cup, 215 calories). l Steamed broccoli ( cup, 20 calories). l Strawberries (1  cup, 65 calories). l Whipped cream (1 Tbsp, 50 calories).   Daily Calorie Total: 1425 Information from www.eatright.org, Foodwise Nutritional Analysis Database.   Document Released: 04/24/2005  Document Re-Released: 05/16/2009 ExitCare Patient Information 2011 [employer].1500 Calorie Diabetic Diet   The 1500 calorie diabetic diet limits calories to 1500 each day. Following this diet and making healthy meal choices can help improve overall health. It controls blood sugar levels and can also help lower blood pressure and cholesterol.     SERVING SIZES: Measuring foods and serving sizes helps to make sure you are getting the right amount of food.  The list below tells how big or small some common serving sizes are.    1 ounce (oz) . . . . . . . . . . . . . . . Marland Kitchen  4 stacked dice  3 oz  . . . . . . . . . . . . . . . . . . . . . Marland Kitchen Deck of cards  1 teaspoon (tsp) . . . . . . . . . . . . .  Tip of little finger  1 tablespoon (Tbsp) . . . . . . . . . .  Thumb  2 Tbsp . . . . . . . . . . . . . . . . . . . . .  Golf ball  1/2 cup . . . . . . . . . . . . . . . . . . . .  Half of a fist  1 cup . . . . . . . . . . . . . . . . . . . . .   A fist      GUIDELINES FOR CHOOSING FOODS: The goal of this diet is to eat a variety of foods and limit calories to 1500 each day. This can be done by choosing foods that are low in calories and fat. The diet also suggests eating small amounts of food frequently.  Doing this helps control your blood sugar levels, so they do not get too high or too low. Each meal or snack may include a protein food source to help you  feel more satisfied. Try to eat about the same amount of food around the same time each day. This includes weekend days, travel days, and days off work. Space your meals about 4-5 hours apart, and add a snack between them, if you wish.     For example, a daily food plan could include breakfast, a morning snack, lunch, dinner, and an evening snack. Healthy meals and snacks have different types of foods, including whole grains, vegetables, fruits, lean meats, poultry, fish, and dairy products. As you plan your meals, select a variety of foods. Choose from the bread and starch, vegetable, fruit, dairy, and meat/protein groups. Examples of foods from each group are listed below, with their suggested serving sizes. Use measuring cups and spoons to become familiar with what a healthy portion looks like.   Bread and Starch       Each serving equals 15 grams of carbohydrate 1 slice bread        1/4 bagel        3/4 cup cold cereal (unsweetened)      1/2 cup hot cereal or mashed potatoes   1 small potato (size of a computer mouse) 1/3 cup cooked pasta or rice 1/2 English muffin 1 cup broth based soup 3 cups of popcorn 4-6 whole wheat crackers 1/2 cup cooked beans, peas, or corn   Vegetables Each serving equals 5 grams of carbohydrate 1/2 cup cooked vegetables 1 cup raw vegetables 1/2 cup tomato or vegetable juice   Fruit         Each serving equals 15 grams of carbohydrate       1 small apple or orange      1-1/4 cup watermelon or strawberries     1/2 cup applesauce (no sugar added)     2 Tbsp raisins  1/2 banana 1/2 cup canned fruit, packed in water or in its own juice 1/2 cup unsweetened fruit juice         Dairy Each serving equals 12-15 grams of carbohydrate       1 cup fat free milk 6 oz artificially sweetened yogurt or plain  yogurt 1 cup low fat buttermilk 1 cup soy milk 1 cup almond milk   Meat/Protein 1 large egg 2-3 oz meat, poultry, or fish 1/4 cup low fat cottage cheese 1 Tbsp peanut butter 1 oz low fat cheese 1/4 cup tuna, packed in water 1/2 cup tofu   Fat 1 tsp oil 1 tsp trans fat free margarine 1 tsp butter 1 tsp mayonnaise 2 Tbsp avocado 1 Tbsp salad dressing 1 Tbsp cream cheese 2 Tbsp sour cream   SAMPLE 1500 CALORIE DIET PLAN: Breakfast:  1/2 whole wheat English muffin (1 carb choice)  1 tsp trans fat free margarine  1 scrambled egg  1 cup fat free milk (1 carb choice)  1 small orange (1 carb choice)   Lunch:  Chicken wrap l 1 whole wheat tortilla, 8-inch (1-1/2 carb choices) l 2 oz chicken breast, sliced l 2 Tbsp low fat salad dressing, such as [place] l 1/4 cup shredded lettuce l 2 slices tomato  1/2 cup carrot sticks  1 small apple (1 carb choice)   Afternoon Snack:  3 graham cracker squares (1 carb choice)  1 Tbsp peanut butter   Dinner:  2 oz lean pork chop, broiled  1 cup brown rice (3 carb choices)  1/2 cup steamed carrots  1/2 cup green beans  1 cup fat free milk (1 carb choice)  1 tsp trans fat free margarine   Evening Snack:  1/2 cup low fat cottage cheese  1 small peach or pear, sliced (or 1/2 cup canned in water) (1 carb choice)   Meal Plan You can use this worksheet to help you make a daily meal plan based on the 1500 calorie diabetic diet suggestions. If you are using this plan to help you control your blood glucose, you may interchange carbohydrate containing foods (dairy, starches, and fruits). Select a variety of fresh foods of varying colors and flavors. The total amount of carbohydrate in your meals or snacks is more important than making sure you include all of the food groups every time you eat. You can choose from approximately this many of the following foods to build your day's meals:  6 Starches.  3 Vegetables.  2  Fruits.  2 Dairy.  4-6 oz Meat/Protein.  Up to 3 Fats.    Your dietician can use this worksheet to help you decide how many servings and which types of foods are right for you.   Breakfast Food Group and Servings               Food Choice Starch _________________________________________________________ Dairy __________________________________________________________ Fruit ___________________________________________________________ Meat/Protein_____________________________________________________ Fat ____________________________________________________________   Lunch Food Group and Servings                Food Choice        Starch _________________________________________________________ Meat/Protein ____________________________________________________ Vegetables ______________________________________________________ Fruit ___________________________________________________________ Dairy __________________________________________________________ Fat ____________________________________________________________   Afternoon Snack Food Group and Servings                Food Choice Dairy __________________________________________________________ Starch __________________________________________________________ Meat/Protein_____________________________________________________ Zada Girt ___________________________________________________________   Dinner Food Group and Servings                      Food Choice Starch _________________________________________________________ Meat/Protein ____________________________________________________ Dairy __________________________________________________________ Vegetable ______________________________________________________ Fruit ___________________________________________________________ Fat ____________________________________________________________   Evening Snack Food Group and Servings                 Food Choice Fruit  ___________________________________________________________ Meat/Protein ____________________________________________________ Dairy __________________________________________________________ Starch __________________________________________________________   Daily Totals Starches _________________________ Vegetables _________________________ Fruits _____________________________ Dairy _____________________________ Meat/Protein________________________ Fats _______________________________    Much of this information courtesy of http://www.long-jenkins.com/.   Document Released: 11/14/2004  Document Re-Released: 04/12/2009 ExitCare Patient Information 2011 [place], [place].

## 2010-06-25 LAB — LIPID PANEL
Cholesterol: 86 mg/dL (ref 0–200)
HDL: 22 mg/dL — ABNORMAL LOW (ref >39)
LDL Cholesterol: 26 mg/dL (ref 0–99)
Total CHOL/HDL Ratio: 3.9 Ratio
Triglycerides: 190 mg/dL — ABNORMAL HIGH (ref <150)
VLDL: 38 mg/dL (ref 0–40)

## 2010-06-25 LAB — HEPATIC FUNCTION PANEL
ALT: 23 U/L (ref 0–35)
AST: 21 U/L (ref 0–37)
Albumin: 4.2 g/dL (ref 3.5–5.2)
Alkaline Phosphatase: 88 U/L (ref 39–117)
Bilirubin, Direct: <0.1 mg/dL (ref 0.0–0.3)
Total Bilirubin: 0.2 mg/dL — ABNORMAL LOW (ref 0.3–1.2)
Total Protein: 6.5 g/dL (ref 6.0–8.3)

## 2010-06-25 LAB — HEMOGLOBIN A1C
Hgb A1c MFr Bld: 6.9 % — ABNORMAL HIGH (ref <5.7)
Mean Plasma Glucose: 151 mg/dL — ABNORMAL HIGH (ref <117)

## 2010-06-25 LAB — BASIC METABOLIC PANEL
BUN: 21 mg/dL (ref 6–23)
CO2: 21 mEq/L (ref 19–32)
Calcium: 8.8 mg/dL (ref 8.4–10.5)
Chloride: 102 mEq/L (ref 96–112)
Creat: 0.92 mg/dL (ref 0.40–1.20)
Glucose, Bld: 144 mg/dL — ABNORMAL HIGH (ref 70–99)
Potassium: 4.7 mEq/L (ref 3.5–5.3)
Sodium: 138 mEq/L (ref 135–145)

## 2010-06-26 ENCOUNTER — Encounter: Payer: Self-pay | Admitting: Family Medicine

## 2010-06-26 ENCOUNTER — Other Ambulatory Visit: Payer: Self-pay | Admitting: Family Medicine

## 2010-06-26 DIAGNOSIS — F329 Major depressive disorder, single episode, unspecified: Secondary | ICD-10-CM

## 2010-06-26 DIAGNOSIS — F32A Depression, unspecified: Secondary | ICD-10-CM

## 2010-06-27 NOTE — Progress Notes (Signed)
Quick Note:  Pt informed, will call for ROV in 3-4 months. Labs faxed to Dr Gery Pray, pt aware ______

## 2010-07-07 ENCOUNTER — Other Ambulatory Visit: Payer: Self-pay | Admitting: Family Medicine

## 2010-07-07 DIAGNOSIS — E785 Hyperlipidemia, unspecified: Secondary | ICD-10-CM

## 2010-07-08 ENCOUNTER — Telehealth: Payer: Self-pay | Admitting: *Deleted

## 2010-07-08 DIAGNOSIS — R05 Cough: Secondary | ICD-10-CM

## 2010-07-08 DIAGNOSIS — R059 Cough, unspecified: Secondary | ICD-10-CM

## 2010-07-08 MED ORDER — HYDROCODONE-HOMATROPINE 5-1.5 MG/5ML PO SYRP: 5.0000 mL | ORAL_SOLUTION | Freq: Four times a day (QID) | ORAL | Status: AC | PRN

## 2010-07-08 NOTE — Telephone Encounter (Signed)
OK to refill Hydromet cough syrup, 1 tsp po q6 hours prn cough, disp 120 ml with no refills.

## 2010-07-08 NOTE — Telephone Encounter (Signed)
Faxed request received to refill Hydrocodone Homatropine syrup.  I do not see this med on her med list in Epic? [facility]

## 2010-07-08 NOTE — Telephone Encounter (Signed)
Rx called, pt informed

## 2010-07-18 ENCOUNTER — Other Ambulatory Visit: Payer: Self-pay | Admitting: Family Medicine

## 2010-07-26 LAB — GLUCOSE, CAPILLARY
Glucose-Capillary: 111 mg/dL — ABNORMAL HIGH (ref 70–99)
Glucose-Capillary: 141 mg/dL — ABNORMAL HIGH (ref 70–99)
Glucose-Capillary: 147 mg/dL — ABNORMAL HIGH (ref 70–99)
Glucose-Capillary: 148 mg/dL — ABNORMAL HIGH (ref 70–99)
Glucose-Capillary: 173 mg/dL — ABNORMAL HIGH (ref 70–99)
Glucose-Capillary: 176 mg/dL — ABNORMAL HIGH (ref 70–99)
Glucose-Capillary: 177 mg/dL — ABNORMAL HIGH (ref 70–99)
Glucose-Capillary: 178 mg/dL — ABNORMAL HIGH (ref 70–99)
Glucose-Capillary: 178 mg/dL — ABNORMAL HIGH (ref 70–99)
Glucose-Capillary: 181 mg/dL — ABNORMAL HIGH (ref 70–99)
Glucose-Capillary: 183 mg/dL — ABNORMAL HIGH (ref 70–99)
Glucose-Capillary: 186 mg/dL — ABNORMAL HIGH (ref 70–99)
Glucose-Capillary: 193 mg/dL — ABNORMAL HIGH (ref 70–99)
Glucose-Capillary: 194 mg/dL — ABNORMAL HIGH (ref 70–99)
Glucose-Capillary: 199 mg/dL — ABNORMAL HIGH (ref 70–99)
Glucose-Capillary: 201 mg/dL — ABNORMAL HIGH (ref 70–99)
Glucose-Capillary: 229 mg/dL — ABNORMAL HIGH (ref 70–99)
Glucose-Capillary: 235 mg/dL — ABNORMAL HIGH (ref 70–99)
Glucose-Capillary: 289 mg/dL — ABNORMAL HIGH (ref 70–99)
Glucose-Capillary: 94 mg/dL (ref 70–99)

## 2010-07-26 LAB — URINALYSIS, ROUTINE W REFLEX MICROSCOPIC
Glucose, UA: NEGATIVE mg/dL
Hgb urine dipstick: NEGATIVE
Ketones, ur: NEGATIVE mg/dL
Leukocytes, UA: NEGATIVE
Nitrite: NEGATIVE
Protein, ur: 100 mg/dL — AB
Specific Gravity, Urine: 1.024 (ref 1.005–1.030)
Urobilinogen, UA: 0.2 mg/dL (ref 0.0–1.0)
pH: 5 (ref 5.0–8.0)

## 2010-07-26 LAB — CBC
HCT: 35.6 % — ABNORMAL LOW (ref 36.0–46.0)
HCT: 37.8 % (ref 36.0–46.0)
HCT: 38.8 % (ref 36.0–46.0)
HCT: 41.4 % (ref 36.0–46.0)
HCT: 43.5 % (ref 36.0–46.0)
Hemoglobin: 12.1 g/dL (ref 12.0–15.0)
Hemoglobin: 13 g/dL (ref 12.0–15.0)
Hemoglobin: 13.1 g/dL (ref 12.0–15.0)
Hemoglobin: 14.1 g/dL (ref 12.0–15.0)
Hemoglobin: 14.5 g/dL (ref 12.0–15.0)
MCHC: 33.4 g/dL (ref 30.0–36.0)
MCHC: 33.6 g/dL (ref 30.0–36.0)
MCHC: 34 g/dL (ref 30.0–36.0)
MCHC: 34 g/dL (ref 30.0–36.0)
MCHC: 34.4 g/dL (ref 30.0–36.0)
MCV: 87.5 fL (ref 78.0–100.0)
MCV: 87.6 fL (ref 78.0–100.0)
MCV: 87.6 fL (ref 78.0–100.0)
MCV: 88.2 fL (ref 78.0–100.0)
MCV: 88.6 fL (ref 78.0–100.0)
Platelets: 241 10*3/uL (ref 150–400)
Platelets: 241 K/uL (ref 150–400)
Platelets: 249 10*3/uL (ref 150–400)
Platelets: 279 10*3/uL (ref 150–400)
Platelets: 293 10*3/uL (ref 150–400)
RBC: 4.07 MIL/uL (ref 3.87–5.11)
RBC: 4.31 MIL/uL (ref 3.87–5.11)
RBC: 4.38 MIL/uL (ref 3.87–5.11)
RBC: 4.7 MIL/uL (ref 3.87–5.11)
RBC: 4.96 MIL/uL (ref 3.87–5.11)
RDW: 15.5 % (ref 11.5–15.5)
RDW: 15.7 % — ABNORMAL HIGH (ref 11.5–15.5)
RDW: 15.8 % — ABNORMAL HIGH (ref 11.5–15.5)
RDW: 15.9 % — ABNORMAL HIGH (ref 11.5–15.5)
RDW: 16.2 % — ABNORMAL HIGH (ref 11.5–15.5)
WBC: 11.9 10*3/uL — ABNORMAL HIGH (ref 4.0–10.5)
WBC: 12.5 K/uL — ABNORMAL HIGH (ref 4.0–10.5)
WBC: 16.4 10*3/uL — ABNORMAL HIGH (ref 4.0–10.5)
WBC: 8.8 10*3/uL (ref 4.0–10.5)
WBC: 8.9 10*3/uL (ref 4.0–10.5)

## 2010-07-26 LAB — BASIC METABOLIC PANEL WITH GFR
BUN: 21 mg/dL (ref 6–23)
CO2: 24 meq/L (ref 19–32)
Calcium: 9.2 mg/dL (ref 8.4–10.5)
Chloride: 100 meq/L (ref 96–112)
Creatinine, Ser: 0.88 mg/dL (ref 0.4–1.2)
GFR calc non Af Amer: >60 mL/min
Glucose, Bld: 163 mg/dL — ABNORMAL HIGH (ref 70–99)
Potassium: 3.5 meq/L (ref 3.5–5.1)
Sodium: 134 meq/L — ABNORMAL LOW (ref 135–145)

## 2010-07-26 LAB — COMPREHENSIVE METABOLIC PANEL
ALT: 33 U/L (ref 0–35)
AST: 32 U/L (ref 0–37)
Albumin: 3.9 g/dL (ref 3.5–5.2)
Alkaline Phosphatase: 93 U/L (ref 39–117)
BUN: 35 mg/dL — ABNORMAL HIGH (ref 6–23)
CO2: 28 mEq/L (ref 19–32)
Calcium: 10.2 mg/dL (ref 8.4–10.5)
Chloride: 97 mEq/L (ref 96–112)
Creatinine, Ser: 1.16 mg/dL (ref 0.4–1.2)
GFR calc Af Amer: 56 mL/min — ABNORMAL LOW (ref >60)
GFR calc non Af Amer: 46 mL/min — ABNORMAL LOW (ref >60)
Glucose, Bld: 151 mg/dL — ABNORMAL HIGH (ref 70–99)
Potassium: 3.3 mEq/L — ABNORMAL LOW (ref 3.5–5.1)
Sodium: 134 mEq/L — ABNORMAL LOW (ref 135–145)
Total Bilirubin: 0.6 mg/dL (ref 0.3–1.2)
Total Protein: 7.8 g/dL (ref 6.0–8.3)

## 2010-07-26 LAB — DIFFERENTIAL
Basophils Absolute: 0 10*3/uL (ref 0.0–0.1)
Basophils Absolute: 0.1 10*3/uL (ref 0.0–0.1)
Basophils Absolute: 0.1 10*3/uL (ref 0.0–0.1)
Basophils Relative: 0 % (ref 0–1)
Basophils Relative: 1 % (ref 0–1)
Basophils Relative: 1 % (ref 0–1)
Eosinophils Absolute: 0.2 10*3/uL (ref 0.0–0.7)
Eosinophils Absolute: 0.2 10*3/uL (ref 0.0–0.7)
Eosinophils Absolute: 0.3 10*3/uL (ref 0.0–0.7)
Eosinophils Relative: 1 % (ref 0–5)
Eosinophils Relative: 2 % (ref 0–5)
Eosinophils Relative: 3 % (ref 0–5)
Lymphocytes Relative: 19 % (ref 12–46)
Lymphocytes Relative: 23 % (ref 12–46)
Lymphocytes Relative: 30 % (ref 12–46)
Lymphs Abs: 2.6 10*3/uL (ref 0.7–4.0)
Lymphs Abs: 2.9 10*3/uL (ref 0.7–4.0)
Lymphs Abs: 3.2 10*3/uL (ref 0.7–4.0)
Monocytes Absolute: 0.8 10*3/uL (ref 0.1–1.0)
Monocytes Absolute: 1 10*3/uL (ref 0.1–1.0)
Monocytes Absolute: 1.2 10*3/uL — ABNORMAL HIGH (ref 0.1–1.0)
Monocytes Relative: 8 % (ref 3–12)
Monocytes Relative: 8 % (ref 3–12)
Monocytes Relative: 9 % (ref 3–12)
Neutro Abs: 11.6 10*3/uL — ABNORMAL HIGH (ref 1.7–7.7)
Neutro Abs: 5.1 10*3/uL (ref 1.7–7.7)
Neutro Abs: 8.3 10*3/uL — ABNORMAL HIGH (ref 1.7–7.7)
Neutrophils Relative %: 58 % (ref 43–77)
Neutrophils Relative %: 67 % (ref 43–77)
Neutrophils Relative %: 71 % (ref 43–77)

## 2010-07-26 LAB — BASIC METABOLIC PANEL
BUN: 19 mg/dL (ref 6–23)
BUN: 7 mg/dL (ref 6–23)
BUN: 9 mg/dL (ref 6–23)
CO2: 25 mEq/L (ref 19–32)
CO2: 27 mEq/L (ref 19–32)
CO2: 28 mEq/L (ref 19–32)
Calcium: 10.1 mg/dL (ref 8.4–10.5)
Calcium: 9.5 mg/dL (ref 8.4–10.5)
Calcium: 9.6 mg/dL (ref 8.4–10.5)
Chloride: 103 mEq/L (ref 96–112)
Chloride: 104 mEq/L (ref 96–112)
Chloride: 105 mEq/L (ref 96–112)
Creatinine, Ser: 0.79 mg/dL (ref 0.4–1.2)
Creatinine, Ser: 0.8 mg/dL (ref 0.4–1.2)
Creatinine, Ser: 1.13 mg/dL (ref 0.4–1.2)
GFR calc Af Amer: 57 mL/min — ABNORMAL LOW (ref >60)
GFR calc Af Amer: >60 mL/min (ref >60)
GFR calc Af Amer: >60 mL/min (ref >60)
GFR calc non Af Amer: 47 mL/min — ABNORMAL LOW (ref >60)
GFR calc non Af Amer: >60 mL/min (ref >60)
GFR calc non Af Amer: >60 mL/min (ref >60)
Glucose, Bld: 175 mg/dL — ABNORMAL HIGH (ref 70–99)
Glucose, Bld: 178 mg/dL — ABNORMAL HIGH (ref 70–99)
Glucose, Bld: 186 mg/dL — ABNORMAL HIGH (ref 70–99)
Potassium: 3.1 mEq/L — ABNORMAL LOW (ref 3.5–5.1)
Potassium: 3.5 mEq/L (ref 3.5–5.1)
Potassium: 4 mEq/L (ref 3.5–5.1)
Sodium: 138 mEq/L (ref 135–145)
Sodium: 139 mEq/L (ref 135–145)
Sodium: 140 mEq/L (ref 135–145)

## 2010-07-26 LAB — CARDIAC PANEL(CRET KIN+CKTOT+MB+TROPI)
CK, MB: 1.1 ng/mL (ref 0.3–4.0)
Relative Index: INVALID (ref 0.0–2.5)
Total CK: 53 U/L (ref 7–177)
Troponin I: 0.04 ng/mL (ref 0.00–0.06)

## 2010-07-26 LAB — URINE CULTURE: Colony Count: 15000

## 2010-07-26 LAB — URINE MICROSCOPIC-ADD ON

## 2010-07-27 LAB — CBC
HCT: 39 % (ref 36.0–46.0)
HCT: 40.1 % (ref 36.0–46.0)
HCT: 44.4 % (ref 36.0–46.0)
HCT: 32.5 % — ABNORMAL LOW (ref 36.0–46.0)
HCT: 39.7 % (ref 36.0–46.0)
Hemoglobin: 12.9 g/dL (ref 12.0–15.0)
Hemoglobin: 13.4 g/dL (ref 12.0–15.0)
Hemoglobin: 14.9 g/dL (ref 12.0–15.0)
Hemoglobin: 10.9 g/dL — ABNORMAL LOW (ref 12.0–15.0)
Hemoglobin: 13.2 g/dL (ref 12.0–15.0)
MCHC: 32.9 g/dL (ref 30.0–36.0)
MCHC: 33.3 g/dL (ref 30.0–36.0)
MCHC: 33.4 g/dL (ref 30.0–36.0)
MCHC: 33.3 g/dL (ref 30.0–36.0)
MCHC: 33.4 g/dL (ref 30.0–36.0)
MCV: 87.2 fL (ref 78.0–100.0)
MCV: 88.6 fL (ref 78.0–100.0)
MCV: 88.6 fL (ref 78.0–100.0)
MCV: 87.5 fL (ref 78.0–100.0)
MCV: 87.7 fL (ref 78.0–100.0)
Platelets: 227 10*3/uL (ref 150–400)
Platelets: 233 10*3/uL (ref 150–400)
Platelets: 261 10*3/uL (ref 150–400)
Platelets: 190 10*3/uL (ref 150–400)
Platelets: 225 10*3/uL (ref 150–400)
RBC: 4.41 MIL/uL (ref 3.87–5.11)
RBC: 4.6 MIL/uL (ref 3.87–5.11)
RBC: 5.01 MIL/uL (ref 3.87–5.11)
RBC: 3.7 MIL/uL — ABNORMAL LOW (ref 3.87–5.11)
RBC: 4.54 MIL/uL (ref 3.87–5.11)
RDW: 15.8 % — ABNORMAL HIGH (ref 11.5–15.5)
RDW: 16 % — ABNORMAL HIGH (ref 11.5–15.5)
RDW: 16.2 % — ABNORMAL HIGH (ref 11.5–15.5)
RDW: 16.5 % — ABNORMAL HIGH (ref 11.5–15.5)
RDW: 16.6 % — ABNORMAL HIGH (ref 11.5–15.5)
WBC: 11.1 10*3/uL — ABNORMAL HIGH (ref 4.0–10.5)
WBC: 11.3 10*3/uL — ABNORMAL HIGH (ref 4.0–10.5)
WBC: 13.4 10*3/uL — ABNORMAL HIGH (ref 4.0–10.5)
WBC: 11.3 10*3/uL — ABNORMAL HIGH (ref 4.0–10.5)
WBC: 9.4 10*3/uL (ref 4.0–10.5)

## 2010-07-27 LAB — BASIC METABOLIC PANEL
BUN: 14 mg/dL (ref 6–23)
BUN: 9 mg/dL (ref 6–23)
BUN: 9 mg/dL (ref 6–23)
BUN: 13 mg/dL (ref 6–23)
CO2: 25 mEq/L (ref 19–32)
CO2: 26 mEq/L (ref 19–32)
CO2: 27 mEq/L (ref 19–32)
CO2: 25 mEq/L (ref 19–32)
Calcium: 9.5 mg/dL (ref 8.4–10.5)
Calcium: 9.6 mg/dL (ref 8.4–10.5)
Calcium: 9.7 mg/dL (ref 8.4–10.5)
Calcium: 9 mg/dL (ref 8.4–10.5)
Chloride: 101 mEq/L (ref 96–112)
Chloride: 105 mEq/L (ref 96–112)
Chloride: 95 mEq/L — ABNORMAL LOW (ref 96–112)
Chloride: 101 mEq/L (ref 96–112)
Creatinine, Ser: 0.73 mg/dL (ref 0.4–1.2)
Creatinine, Ser: 0.74 mg/dL (ref 0.4–1.2)
Creatinine, Ser: 0.79 mg/dL (ref 0.4–1.2)
Creatinine, Ser: 0.77 mg/dL (ref 0.4–1.2)
GFR calc Af Amer: >60 mL/min (ref >60)
GFR calc Af Amer: >60 mL/min (ref >60)
GFR calc Af Amer: >60 mL/min (ref >60)
GFR calc Af Amer: >60 mL/min (ref >60)
GFR calc non Af Amer: >60 mL/min (ref >60)
GFR calc non Af Amer: >60 mL/min (ref >60)
GFR calc non Af Amer: >60 mL/min (ref >60)
GFR calc non Af Amer: >60 mL/min (ref >60)
Glucose, Bld: 128 mg/dL — ABNORMAL HIGH (ref 70–99)
Glucose, Bld: 161 mg/dL — ABNORMAL HIGH (ref 70–99)
Glucose, Bld: 169 mg/dL — ABNORMAL HIGH (ref 70–99)
Glucose, Bld: 148 mg/dL — ABNORMAL HIGH (ref 70–99)
Potassium: 3.3 mEq/L — ABNORMAL LOW (ref 3.5–5.1)
Potassium: 3.6 mEq/L (ref 3.5–5.1)
Potassium: 3.8 mEq/L (ref 3.5–5.1)
Potassium: 3.5 mEq/L (ref 3.5–5.1)
Sodium: 134 mEq/L — ABNORMAL LOW (ref 135–145)
Sodium: 139 mEq/L (ref 135–145)
Sodium: 139 mEq/L (ref 135–145)
Sodium: 138 mEq/L (ref 135–145)

## 2010-07-27 LAB — URINALYSIS, ROUTINE W REFLEX MICROSCOPIC
Bilirubin Urine: NEGATIVE
Bilirubin Urine: NEGATIVE
Glucose, UA: NEGATIVE mg/dL
Glucose, UA: NEGATIVE mg/dL
Hgb urine dipstick: NEGATIVE
Ketones, ur: NEGATIVE mg/dL
Ketones, ur: NEGATIVE mg/dL
Nitrite: NEGATIVE
Nitrite: NEGATIVE
Protein, ur: 100 mg/dL — AB
Protein, ur: NEGATIVE mg/dL
Specific Gravity, Urine: 1.015 (ref 1.005–1.030)
Specific Gravity, Urine: 1.007 (ref 1.005–1.030)
Urobilinogen, UA: 0.2 mg/dL (ref 0.0–1.0)
Urobilinogen, UA: 0.2 mg/dL (ref 0.0–1.0)
pH: 6 (ref 5.0–8.0)
pH: 7.5 (ref 5.0–8.0)

## 2010-07-27 LAB — MRSA PCR SCREENING: MRSA by PCR: NEGATIVE

## 2010-07-27 LAB — COMPREHENSIVE METABOLIC PANEL
ALT: 28 U/L (ref 0–35)
AST: 26 U/L (ref 0–37)
Albumin: 4.2 g/dL (ref 3.5–5.2)
Alkaline Phosphatase: 82 U/L (ref 39–117)
BUN: 14 mg/dL (ref 6–23)
CO2: 30 mEq/L (ref 19–32)
Calcium: 9.5 mg/dL (ref 8.4–10.5)
Chloride: 99 mEq/L (ref 96–112)
Creatinine, Ser: 0.72 mg/dL (ref 0.4–1.2)
GFR calc Af Amer: >60 mL/min (ref >60)
GFR calc non Af Amer: >60 mL/min (ref >60)
Glucose, Bld: 115 mg/dL — ABNORMAL HIGH (ref 70–99)
Potassium: 4.1 mEq/L (ref 3.5–5.1)
Sodium: 137 mEq/L (ref 135–145)
Total Bilirubin: 0.5 mg/dL (ref 0.3–1.2)
Total Protein: 7.2 g/dL (ref 6.0–8.3)

## 2010-07-27 LAB — TYPE AND SCREEN
ABO/RH(D): O NEG
Antibody Screen: POSITIVE
DAT, IgG: NEGATIVE

## 2010-07-27 LAB — CARDIAC PANEL(CRET KIN+CKTOT+MB+TROPI)
CK, MB: 1.1 ng/mL (ref 0.3–4.0)
CK, MB: 1.2 ng/mL (ref 0.3–4.0)
CK, MB: 1.2 ng/mL (ref 0.3–4.0)
CK, MB: 1.3 ng/mL (ref 0.3–4.0)
CK, MB: 1.4 ng/mL (ref 0.3–4.0)
Relative Index: 1.1 (ref 0.0–2.5)
Relative Index: 1.1 (ref 0.0–2.5)
Relative Index: INVALID (ref 0.0–2.5)
Relative Index: INVALID (ref 0.0–2.5)
Relative Index: INVALID (ref 0.0–2.5)
Total CK: 117 U/L (ref 7–177)
Total CK: 123 U/L (ref 7–177)
Total CK: 53 U/L (ref 7–177)
Total CK: 62 U/L (ref 7–177)
Total CK: 89 U/L (ref 7–177)
Troponin I: 0.01 ng/mL (ref 0.00–0.06)
Troponin I: 0.02 ng/mL (ref 0.00–0.06)
Troponin I: 0.03 ng/mL (ref 0.00–0.06)
Troponin I: 0.03 ng/mL (ref 0.00–0.06)
Troponin I: 0.04 ng/mL (ref 0.00–0.06)

## 2010-07-27 LAB — PROTIME-INR
INR: 1.04 (ref 0.00–1.49)
INR: 1.03 (ref 0.00–1.49)
Prothrombin Time: 13.5 seconds (ref 11.6–15.2)
Prothrombin Time: 13.4 seconds (ref 11.6–15.2)

## 2010-07-27 LAB — GLUCOSE, CAPILLARY
Glucose-Capillary: 116 mg/dL — ABNORMAL HIGH (ref 70–99)
Glucose-Capillary: 121 mg/dL — ABNORMAL HIGH (ref 70–99)
Glucose-Capillary: 150 mg/dL — ABNORMAL HIGH (ref 70–99)
Glucose-Capillary: 150 mg/dL — ABNORMAL HIGH (ref 70–99)
Glucose-Capillary: 163 mg/dL — ABNORMAL HIGH (ref 70–99)
Glucose-Capillary: 164 mg/dL — ABNORMAL HIGH (ref 70–99)
Glucose-Capillary: 169 mg/dL — ABNORMAL HIGH (ref 70–99)
Glucose-Capillary: 179 mg/dL — ABNORMAL HIGH (ref 70–99)
Glucose-Capillary: 189 mg/dL — ABNORMAL HIGH (ref 70–99)
Glucose-Capillary: 192 mg/dL — ABNORMAL HIGH (ref 70–99)
Glucose-Capillary: 233 mg/dL — ABNORMAL HIGH (ref 70–99)
Glucose-Capillary: 233 mg/dL — ABNORMAL HIGH (ref 70–99)
Glucose-Capillary: 235 mg/dL — ABNORMAL HIGH (ref 70–99)
Glucose-Capillary: 288 mg/dL — ABNORMAL HIGH (ref 70–99)
Glucose-Capillary: 326 mg/dL — ABNORMAL HIGH (ref 70–99)
Glucose-Capillary: 98 mg/dL (ref 70–99)
Glucose-Capillary: 116 mg/dL — ABNORMAL HIGH (ref 70–99)
Glucose-Capillary: 133 mg/dL — ABNORMAL HIGH (ref 70–99)
Glucose-Capillary: 158 mg/dL — ABNORMAL HIGH (ref 70–99)

## 2010-07-27 LAB — URINE MICROSCOPIC-ADD ON

## 2010-07-27 LAB — HEMOGLOBIN A1C
Hgb A1c MFr Bld: 7 % — ABNORMAL HIGH (ref 4.6–6.1)
Mean Plasma Glucose: 154 mg/dL

## 2010-07-27 LAB — DIFFERENTIAL
Basophils Absolute: 0 10*3/uL (ref 0.0–0.1)
Basophils Relative: 0 % (ref 0–1)
Eosinophils Absolute: 0.4 10*3/uL (ref 0.0–0.7)
Eosinophils Relative: 3 % (ref 0–5)
Lymphocytes Relative: 20 % (ref 12–46)
Lymphs Abs: 2.7 10*3/uL (ref 0.7–4.0)
Monocytes Absolute: 1 10*3/uL (ref 0.1–1.0)
Monocytes Relative: 7 % (ref 3–12)
Neutro Abs: 9.4 10*3/uL — ABNORMAL HIGH (ref 1.7–7.7)
Neutrophils Relative %: 70 % (ref 43–77)

## 2010-07-27 LAB — POCT CARDIAC MARKERS
CKMB, poc: <1 ng/mL — ABNORMAL LOW (ref 1.0–8.0)
Myoglobin, poc: 119 ng/mL (ref 12–200)
Troponin i, poc: <0.05 ng/mL (ref 0.00–0.09)

## 2010-07-27 LAB — ACETYLCHOLINE RECEPTOR, BINDING: Acetylcholine Receptor Ab: <0.3 nmol/L (ref <=0.30)

## 2010-07-27 LAB — POCT I-STAT, CHEM 8
BUN: 22 mg/dL (ref 6–23)
Calcium, Ion: 1.29 mmol/L (ref 1.12–1.32)
Chloride: 102 mEq/L (ref 96–112)
Creatinine, Ser: 0.6 mg/dL (ref 0.4–1.2)
Glucose, Bld: 158 mg/dL — ABNORMAL HIGH (ref 70–99)
HCT: 41 % (ref 36.0–46.0)
Hemoglobin: 13.9 g/dL (ref 12.0–15.0)
Potassium: 3.8 mEq/L (ref 3.5–5.1)
Sodium: 140 mEq/L (ref 135–145)
TCO2: 29 mmol/L (ref 0–100)

## 2010-07-27 LAB — SEDIMENTATION RATE: Sed Rate: 23 mm/hr — ABNORMAL HIGH (ref 0–22)

## 2010-07-27 LAB — URINE CULTURE: Colony Count: >=100000

## 2010-07-27 LAB — FOLATE RBC: RBC Folate: 1158 ng/mL — ABNORMAL HIGH (ref 180–600)

## 2010-07-27 LAB — VITAMIN B12: Vitamin B-12: 558 pg/mL (ref 211–911)

## 2010-07-27 LAB — MAGNESIUM: Magnesium: 1.7 mg/dL (ref 1.5–2.5)

## 2010-07-27 LAB — TSH: TSH: 1.685 u[IU]/mL (ref 0.350–4.500)

## 2010-07-27 LAB — APTT
aPTT: 30 seconds (ref 24–37)
aPTT: 29 seconds (ref 24–37)

## 2010-08-10 LAB — COMPREHENSIVE METABOLIC PANEL
ALT: 31 U/L (ref 0–35)
AST: 37 U/L (ref 0–37)
Albumin: 3.8 g/dL (ref 3.5–5.2)
Alkaline Phosphatase: 70 U/L (ref 39–117)
BUN: 34 mg/dL — ABNORMAL HIGH (ref 6–23)
CO2: 25 mEq/L (ref 19–32)
Calcium: 10 mg/dL (ref 8.4–10.5)
Chloride: 94 mEq/L — ABNORMAL LOW (ref 96–112)
Creatinine, Ser: 1.25 mg/dL — ABNORMAL HIGH (ref 0.4–1.2)
GFR calc Af Amer: 51 mL/min — ABNORMAL LOW (ref >60)
GFR calc non Af Amer: 42 mL/min — ABNORMAL LOW (ref >60)
Glucose, Bld: 135 mg/dL — ABNORMAL HIGH (ref 70–99)
Potassium: 3.4 mEq/L — ABNORMAL LOW (ref 3.5–5.1)
Sodium: 131 mEq/L — ABNORMAL LOW (ref 135–145)
Total Bilirubin: 0.4 mg/dL (ref 0.3–1.2)
Total Protein: 7.5 g/dL (ref 6.0–8.3)

## 2010-08-10 LAB — BASIC METABOLIC PANEL
BUN: 13 mg/dL (ref 6–23)
BUN: 16 mg/dL (ref 6–23)
BUN: 27 mg/dL — ABNORMAL HIGH (ref 6–23)
BUN: 14 mg/dL (ref 6–23)
CO2: 26 mEq/L (ref 19–32)
CO2: 27 mEq/L (ref 19–32)
CO2: 29 mEq/L (ref 19–32)
CO2: 28 mEq/L (ref 19–32)
Calcium: 9.1 mg/dL (ref 8.4–10.5)
Calcium: 9.2 mg/dL (ref 8.4–10.5)
Calcium: 9.7 mg/dL (ref 8.4–10.5)
Calcium: 9.9 mg/dL (ref 8.4–10.5)
Chloride: 100 mEq/L (ref 96–112)
Chloride: 104 mEq/L (ref 96–112)
Chloride: 106 mEq/L (ref 96–112)
Chloride: 99 mEq/L (ref 96–112)
Creatinine, Ser: 0.8 mg/dL (ref 0.4–1.2)
Creatinine, Ser: 0.86 mg/dL (ref 0.4–1.2)
Creatinine, Ser: 1.03 mg/dL (ref 0.4–1.2)
Creatinine, Ser: 0.84 mg/dL (ref 0.4–1.2)
GFR calc Af Amer: >60 mL/min (ref >60)
GFR calc Af Amer: >60 mL/min (ref >60)
GFR calc Af Amer: >60 mL/min (ref >60)
GFR calc Af Amer: >60 mL/min (ref >60)
GFR calc non Af Amer: 53 mL/min — ABNORMAL LOW (ref >60)
GFR calc non Af Amer: >60 mL/min (ref >60)
GFR calc non Af Amer: >60 mL/min (ref >60)
GFR calc non Af Amer: >60 mL/min (ref >60)
Glucose, Bld: 117 mg/dL — ABNORMAL HIGH (ref 70–99)
Glucose, Bld: 125 mg/dL — ABNORMAL HIGH (ref 70–99)
Glucose, Bld: 150 mg/dL — ABNORMAL HIGH (ref 70–99)
Glucose, Bld: 93 mg/dL (ref 70–99)
Potassium: 3.8 mEq/L (ref 3.5–5.1)
Potassium: 3.8 mEq/L (ref 3.5–5.1)
Potassium: 3.8 mEq/L (ref 3.5–5.1)
Potassium: 4.3 mEq/L (ref 3.5–5.1)
Sodium: 136 mEq/L (ref 135–145)
Sodium: 139 mEq/L (ref 135–145)
Sodium: 140 mEq/L (ref 135–145)
Sodium: 138 mEq/L (ref 135–145)

## 2010-08-10 LAB — GLUCOSE, CAPILLARY
Glucose-Capillary: 118 mg/dL — ABNORMAL HIGH (ref 70–99)
Glucose-Capillary: 119 mg/dL — ABNORMAL HIGH (ref 70–99)
Glucose-Capillary: 119 mg/dL — ABNORMAL HIGH (ref 70–99)
Glucose-Capillary: 131 mg/dL — ABNORMAL HIGH (ref 70–99)
Glucose-Capillary: 154 mg/dL — ABNORMAL HIGH (ref 70–99)
Glucose-Capillary: 156 mg/dL — ABNORMAL HIGH (ref 70–99)
Glucose-Capillary: 158 mg/dL — ABNORMAL HIGH (ref 70–99)
Glucose-Capillary: 178 mg/dL — ABNORMAL HIGH (ref 70–99)
Glucose-Capillary: 180 mg/dL — ABNORMAL HIGH (ref 70–99)
Glucose-Capillary: 188 mg/dL — ABNORMAL HIGH (ref 70–99)
Glucose-Capillary: 281 mg/dL — ABNORMAL HIGH (ref 70–99)
Glucose-Capillary: 74 mg/dL (ref 70–99)
Glucose-Capillary: 83 mg/dL (ref 70–99)
Glucose-Capillary: 136 mg/dL — ABNORMAL HIGH (ref 70–99)
Glucose-Capillary: 158 mg/dL — ABNORMAL HIGH (ref 70–99)
Glucose-Capillary: 161 mg/dL — ABNORMAL HIGH (ref 70–99)

## 2010-08-10 LAB — CARDIAC PANEL(CRET KIN+CKTOT+MB+TROPI)
CK, MB: 1.3 ng/mL (ref 0.3–4.0)
CK, MB: 1.4 ng/mL (ref 0.3–4.0)
Relative Index: INVALID (ref 0.0–2.5)
Relative Index: INVALID (ref 0.0–2.5)
Total CK: 68 U/L (ref 7–177)
Total CK: 73 U/L (ref 7–177)
Troponin I: 0.02 ng/mL (ref 0.00–0.06)
Troponin I: 0.03 ng/mL (ref 0.00–0.06)

## 2010-08-10 LAB — URINALYSIS, MICROSCOPIC ONLY
Bilirubin Urine: NEGATIVE
Glucose, UA: NEGATIVE mg/dL
Hgb urine dipstick: NEGATIVE
Ketones, ur: NEGATIVE mg/dL
Leukocytes, UA: NEGATIVE
Nitrite: NEGATIVE
Protein, ur: NEGATIVE mg/dL
Specific Gravity, Urine: 1.009 (ref 1.005–1.030)
Urobilinogen, UA: 0.2 mg/dL (ref 0.0–1.0)
pH: 5 (ref 5.0–8.0)

## 2010-08-10 LAB — DIFFERENTIAL
Basophils Absolute: 0 10*3/uL (ref 0.0–0.1)
Basophils Relative: 1 % (ref 0–1)
Eosinophils Absolute: 0.4 10*3/uL (ref 0.0–0.7)
Eosinophils Relative: 4 % (ref 0–5)
Lymphocytes Relative: 36 % (ref 12–46)
Lymphs Abs: 3.7 10*3/uL (ref 0.7–4.0)
Monocytes Absolute: 0.9 10*3/uL (ref 0.1–1.0)
Monocytes Relative: 9 % (ref 3–12)
Neutro Abs: 5.3 10*3/uL (ref 1.7–7.7)
Neutrophils Relative %: 51 % (ref 43–77)

## 2010-08-10 LAB — CBC
HCT: 32.6 % — ABNORMAL LOW (ref 36.0–46.0)
HCT: 34.9 % — ABNORMAL LOW (ref 36.0–46.0)
HCT: 38.8 % (ref 36.0–46.0)
HCT: 41.7 % (ref 36.0–46.0)
Hemoglobin: 11 g/dL — ABNORMAL LOW (ref 12.0–15.0)
Hemoglobin: 11.8 g/dL — ABNORMAL LOW (ref 12.0–15.0)
Hemoglobin: 13 g/dL (ref 12.0–15.0)
Hemoglobin: 13.9 g/dL (ref 12.0–15.0)
MCHC: 33.4 g/dL (ref 30.0–36.0)
MCHC: 33.7 g/dL (ref 30.0–36.0)
MCHC: 33.8 g/dL (ref 30.0–36.0)
MCHC: 33.4 g/dL (ref 30.0–36.0)
MCV: 85.5 fL (ref 78.0–100.0)
MCV: 86 fL (ref 78.0–100.0)
MCV: 86.2 fL (ref 78.0–100.0)
MCV: 85.7 fL (ref 78.0–100.0)
Platelets: 236 10*3/uL (ref 150–400)
Platelets: 237 10*3/uL (ref 150–400)
Platelets: 281 10*3/uL (ref 150–400)
Platelets: 276 10*3/uL (ref 150–400)
RBC: 3.78 MIL/uL — ABNORMAL LOW (ref 3.87–5.11)
RBC: 4.08 MIL/uL (ref 3.87–5.11)
RBC: 4.52 MIL/uL (ref 3.87–5.11)
RBC: 4.87 MIL/uL (ref 3.87–5.11)
RDW: 15.3 % (ref 11.5–15.5)
RDW: 15.7 % — ABNORMAL HIGH (ref 11.5–15.5)
RDW: 15.7 % — ABNORMAL HIGH (ref 11.5–15.5)
RDW: 15.4 % (ref 11.5–15.5)
WBC: 10.4 10*3/uL (ref 4.0–10.5)
WBC: 10.5 10*3/uL (ref 4.0–10.5)
WBC: 10.5 10*3/uL (ref 4.0–10.5)
WBC: 13.2 10*3/uL — ABNORMAL HIGH (ref 4.0–10.5)

## 2010-08-10 LAB — ANAEROBIC CULTURE

## 2010-08-10 LAB — D-DIMER, QUANTITATIVE: D-Dimer, Quant: <0.22 ug/mL-FEU (ref 0.00–0.48)

## 2010-08-10 LAB — BRAIN NATRIURETIC PEPTIDE: Pro B Natriuretic peptide (BNP): <30 pg/mL (ref 0.0–100.0)

## 2010-08-10 LAB — HEMOGLOBIN A1C
Hgb A1c MFr Bld: 7.7 % — ABNORMAL HIGH (ref 4.6–6.1)
Mean Plasma Glucose: 174 mg/dL

## 2010-08-10 LAB — TSH: TSH: 1.382 u[IU]/mL (ref 0.350–4.500)

## 2010-08-10 LAB — APTT: aPTT: 28 seconds (ref 24–37)

## 2010-08-10 LAB — CULTURE, ROUTINE-SINUS

## 2010-08-10 LAB — PROTIME-INR
INR: 1.04 (ref 0.00–1.49)
Prothrombin Time: 13.5 seconds (ref 11.6–15.2)

## 2010-08-10 LAB — MAGNESIUM: Magnesium: 1.5 mg/dL (ref 1.5–2.5)

## 2010-08-23 LAB — DIFFERENTIAL
Basophils Absolute: 0 10*3/uL (ref 0.0–0.1)
Basophils Absolute: 0 10*3/uL (ref 0.0–0.1)
Basophils Relative: 0 % (ref 0–1)
Basophils Relative: 0 % (ref 0–1)
Eosinophils Absolute: 0 10*3/uL (ref 0.0–0.7)
Eosinophils Absolute: 0.3 10*3/uL (ref 0.0–0.7)
Eosinophils Relative: 0 % (ref 0–5)
Eosinophils Relative: 2 % (ref 0–5)
Lymphocytes Relative: 13 % (ref 12–46)
Lymphocytes Relative: 4 % — ABNORMAL LOW (ref 12–46)
Lymphs Abs: 0.6 10*3/uL — ABNORMAL LOW (ref 0.7–4.0)
Lymphs Abs: 1.5 10*3/uL (ref 0.7–4.0)
Monocytes Absolute: 0.1 10*3/uL (ref 0.1–1.0)
Monocytes Absolute: 1 10*3/uL (ref 0.1–1.0)
Monocytes Relative: 1 % — ABNORMAL LOW (ref 3–12)
Monocytes Relative: 8 % (ref 3–12)
Neutro Abs: 13.4 10*3/uL — ABNORMAL HIGH (ref 1.7–7.7)
Neutro Abs: 9.2 10*3/uL — ABNORMAL HIGH (ref 1.7–7.7)
Neutrophils Relative %: 77 % (ref 43–77)
Neutrophils Relative %: 95 % — ABNORMAL HIGH (ref 43–77)

## 2010-08-23 LAB — GLUCOSE, CAPILLARY
Glucose-Capillary: 127 mg/dL — ABNORMAL HIGH (ref 70–99)
Glucose-Capillary: 172 mg/dL — ABNORMAL HIGH (ref 70–99)
Glucose-Capillary: 174 mg/dL — ABNORMAL HIGH (ref 70–99)
Glucose-Capillary: 192 mg/dL — ABNORMAL HIGH (ref 70–99)
Glucose-Capillary: 194 mg/dL — ABNORMAL HIGH (ref 70–99)
Glucose-Capillary: 205 mg/dL — ABNORMAL HIGH (ref 70–99)
Glucose-Capillary: 208 mg/dL — ABNORMAL HIGH (ref 70–99)
Glucose-Capillary: 239 mg/dL — ABNORMAL HIGH (ref 70–99)
Glucose-Capillary: 244 mg/dL — ABNORMAL HIGH (ref 70–99)
Glucose-Capillary: 262 mg/dL — ABNORMAL HIGH (ref 70–99)
Glucose-Capillary: 265 mg/dL — ABNORMAL HIGH (ref 70–99)
Glucose-Capillary: 279 mg/dL — ABNORMAL HIGH (ref 70–99)
Glucose-Capillary: 295 mg/dL — ABNORMAL HIGH (ref 70–99)
Glucose-Capillary: 296 mg/dL — ABNORMAL HIGH (ref 70–99)
Glucose-Capillary: 327 mg/dL — ABNORMAL HIGH (ref 70–99)
Glucose-Capillary: 71 mg/dL (ref 70–99)
Glucose-Capillary: 75 mg/dL (ref 70–99)

## 2010-08-23 LAB — CBC
HCT: 36.4 % (ref 36.0–46.0)
HCT: 37.5 % (ref 36.0–46.0)
HCT: 38.5 % (ref 36.0–46.0)
Hemoglobin: 12.4 g/dL (ref 12.0–15.0)
Hemoglobin: 12.6 g/dL (ref 12.0–15.0)
Hemoglobin: 13 g/dL (ref 12.0–15.0)
MCHC: 33.7 g/dL (ref 30.0–36.0)
MCHC: 33.7 g/dL (ref 30.0–36.0)
MCHC: 34.1 g/dL (ref 30.0–36.0)
MCV: 90.1 fL (ref 78.0–100.0)
MCV: 90.1 fL (ref 78.0–100.0)
MCV: 90.8 fL (ref 78.0–100.0)
Platelets: 222 10*3/uL (ref 150–400)
Platelets: 227 10*3/uL (ref 150–400)
Platelets: 249 10*3/uL (ref 150–400)
RBC: 4 MIL/uL (ref 3.87–5.11)
RBC: 4.16 MIL/uL (ref 3.87–5.11)
RBC: 4.27 MIL/uL (ref 3.87–5.11)
RDW: 14.9 % (ref 11.5–15.5)
RDW: 14.9 % (ref 11.5–15.5)
RDW: 15.4 % (ref 11.5–15.5)
WBC: 12 10*3/uL — ABNORMAL HIGH (ref 4.0–10.5)
WBC: 14.1 10*3/uL — ABNORMAL HIGH (ref 4.0–10.5)
WBC: 24.7 10*3/uL — ABNORMAL HIGH (ref 4.0–10.5)

## 2010-08-23 LAB — TROPONIN I
Troponin I: 0.01 ng/mL (ref 0.00–0.06)
Troponin I: <0.01 ng/mL (ref 0.00–0.06)
Troponin I: <0.01 ng/mL (ref 0.00–0.06)

## 2010-08-23 LAB — BASIC METABOLIC PANEL
BUN: 10 mg/dL (ref 6–23)
BUN: 10 mg/dL (ref 6–23)
BUN: 15 mg/dL (ref 6–23)
CO2: 19 mEq/L (ref 19–32)
CO2: 24 mEq/L (ref 19–32)
CO2: 26 mEq/L (ref 19–32)
Calcium: 8 mg/dL — ABNORMAL LOW (ref 8.4–10.5)
Calcium: 8.1 mg/dL — ABNORMAL LOW (ref 8.4–10.5)
Calcium: 8.7 mg/dL (ref 8.4–10.5)
Chloride: 100 mEq/L (ref 96–112)
Chloride: 101 mEq/L (ref 96–112)
Chloride: 106 mEq/L (ref 96–112)
Creatinine, Ser: 0.66 mg/dL (ref 0.4–1.2)
Creatinine, Ser: 0.73 mg/dL (ref 0.4–1.2)
Creatinine, Ser: 0.78 mg/dL (ref 0.4–1.2)
GFR calc Af Amer: >60 mL/min (ref >60)
GFR calc Af Amer: >60 mL/min (ref >60)
GFR calc Af Amer: >60 mL/min (ref >60)
GFR calc non Af Amer: >60 mL/min (ref >60)
GFR calc non Af Amer: >60 mL/min (ref >60)
GFR calc non Af Amer: >60 mL/min (ref >60)
Glucose, Bld: 113 mg/dL — ABNORMAL HIGH (ref 70–99)
Glucose, Bld: 244 mg/dL — ABNORMAL HIGH (ref 70–99)
Glucose, Bld: 255 mg/dL — ABNORMAL HIGH (ref 70–99)
Potassium: 3.4 mEq/L — ABNORMAL LOW (ref 3.5–5.1)
Potassium: 3.7 mEq/L (ref 3.5–5.1)
Potassium: 4.1 mEq/L (ref 3.5–5.1)
Sodium: 134 mEq/L — ABNORMAL LOW (ref 135–145)
Sodium: 137 mEq/L (ref 135–145)
Sodium: 138 mEq/L (ref 135–145)

## 2010-08-23 LAB — CLOSTRIDIUM DIFFICILE EIA: C difficile Toxins A+B, EIA: NEGATIVE

## 2010-08-23 LAB — MAGNESIUM: Magnesium: 1.5 mg/dL (ref 1.5–2.5)

## 2010-08-23 LAB — CK TOTAL AND CKMB (NOT AT ARMC)
CK, MB: 0.8 ng/mL (ref 0.3–4.0)
Relative Index: INVALID (ref 0.0–2.5)
Total CK: 54 U/L (ref 7–177)

## 2010-08-23 LAB — TSH: TSH: 0.993 u[IU]/mL (ref 0.350–4.500)

## 2010-08-23 LAB — D-DIMER, QUANTITATIVE: D-Dimer, Quant: 0.31 ug/mL-FEU (ref 0.00–0.48)

## 2010-09-02 ENCOUNTER — Telehealth: Payer: Self-pay | Admitting: *Deleted

## 2010-09-02 NOTE — Telephone Encounter (Signed)
I have spoken with pt and her CBG is 233 over one hour since took Novolog.  She feels OK at this time. She is instructed to continue to monitor hourly for the next 6 hours and to contact us if CBGs fall below 120.

## 2010-09-02 NOTE — Telephone Encounter (Signed)
Pt calls stating she took 110 units of Novalog by mistake this am, and BS is 225 right now.  Per, Dr. Caryl Never, continue to snack and drink fluids and test BS q 30 min.  If BS drops below 120, call us ASAP.

## 2010-09-20 NOTE — Assessment & Plan Note (Signed)
OFFICE VISIT   Casey Harrell, Casey Harrell  DOB:  10/09/1934                                       06/14/2009  CHART#:[id number]   HISTORY:  The patient is a  76 year old female whom I am seeing at the  request of Dr. Allyson Sabal for evaluation of carotid stenosis.  The patient  suffered a syncopal episode in January and underwent a full workup which  revealed a high-grade right carotid stenosis.  The patient has been  asymptomatic.  She denies numbness or weakness in either extremity.  She  denies slurring of her speech.  She has never had amaurosis fugax.  Her  episode of syncope is likely related to dehydration as well as  manipulation of her blood pressure medications.   She has a history of hypertension and diabetes which have been managed  medically.   PAST MEDICAL HISTORY:  Diabetes, hypertension, hypercholesterolemia,  hypothyroid.   PAST SURGICAL HISTORY:  Include abdominal surgery x 9,  nasal surgery  and a C section.   FAMILY HISTORY:  Positive for cardiovascular disease at an early age in  her mother and father.   SOCIAL HISTORY:  She is married with 2 children.  She is retired.  Does  not smoke.  Has never smoked.  Does not drink.   REVIEW OF SYSTEMS:  GENERAL:  Negative for fevers, chills, weight gain,  weight loss.  CARDIAC  Positive for shortness of breath.  PULMONARY:  Negative.  GI:  Positive for hiatal hernia.  GU:  Negative.  VASCULAR:  Positive for pain in her feet.  NEUROLOGIC:  Positive for dizziness and headaches.  MUSCULOSKELETAL:  Positive for arthritis.  PSYCHIATRIC:  Negative.  ENT:  Negative.  HEME:  Negative.  SKIN:  Negative.   ALLERGIES:  Morphine and doxycycline.   MEDICATIONS:  Please see medication list.  The patient is on aspirin and  simvastatin.   PHYSICAL EXAMINATION:  Vital signs:  Heart rate 101, blood pressure  160/97, temperature is 98.1.  General:  She is well-appearing, in no  distress.  HEENT:  Within normal  limits.  Lungs:  Clear bilaterally.  Cardiovascular:  Regular rate and rhythm.  No carotid bruits.  Extremities:  Warm and well-perfused.  Abdomen:  Soft, nontender.  Musculoskeletal:  Without major deformities.  Neuro:  She has no focal deficits.  Cranial  through II through XII are grossly intact.  Skin:  Without rash.   DIAGNOSTIC STUDIES:  Ultrasound was repeated in our office today.  This  confirms high-grade right carotid stenosis in the 80% to 99% category.  The patient does have a high carotid bifurcation.  However ultrasound  could visualize normal artery above the stenosis.   ASSESSMENT/PLAN:  Asymptomatic right carotid stenosis.   PLAN:  We will proceed with a right carotid endarterectomy on Tuesday,  February 15.  The risks and benefits of the procedure were discussed  with the patient including the risk of stroke, risk of nerve injury,  risk of bleeding.  She has been cleared from a cardiac standpoint by Dr.  Allyson Sabal to proceed with the operation.     Jorge Ny, MD  Electronically Signed   VWB/MEDQ  D:  06/14/2009  T:  06/15/2009  Job:  [id number]   cc:   Nanetta Batty, M.D.

## 2010-09-20 NOTE — H&P (Signed)
NAMEVIKTORIYA, Harrell                    ACCOUNT NO.:  [id number]   MEDICAL RECORD NO.:  [id number]          PATIENT TYPE:  INP   LOCATION:  1445                         FACILITY:  [facility]   PHYSICIAN:  Della Goo, M.D. DATE OF BIRTH:  13-Feb-1935   DATE OF ADMISSION:  06/14/2008  DATE OF DISCHARGE:                              HISTORY & PHYSICAL   PRIMARY CARE PHYSICIAN:  [facility].   CHIEF COMPLAINT:  Shortness of breath.   HISTORY OF PRESENT ILLNESS:  This is a 76 year old female who presents  to the emergency department with complaints of shortness of breath,  fever, cough and chest tightness.  She has been having symptoms which  have been worsening over the past day.  She does report having fevers  and chills and she also reports coughing up yellowish mucus.  She denies  any chest pain.  Denies nausea, vomiting or diarrhea and denies  myalgias.   PAST MEDICAL HISTORY:  Significant for asthma, diabetes type 2,  hypertension.   PAST SURGICAL HISTORY:  History of a hysterectomy, bilateral tubal  ligation history of exploratory laparotomy and bowel resection secondary  to diverticular rupture.   MEDICATIONS:  Byetta.  Metformin.  Benicar.  Coreg.  Furosemide.  Lyrica.  Celebrex.  Her medications will have to be further verified.  [facility] [place] and  also at the [facility] on [address].   ALLERGIES:  DOXYCYCLINE.   SOCIAL HISTORY:  The patient is a nonsmoker, nondrinker.   FAMILY HISTORY:  Noncontributory.   PHYSICAL EXAMINATION:  CONSTITUTIONAL:  This is a 76 year old well-  nourished, well-developed female in discomfort in mild distress.  INITIAL VITAL SIGNS:  Temperature 98.2, blood pressure 157/75, heart  rate 96, respirations 36, O2 saturations 92-97%, initially 92% but with  supplemental oxygen and increased to 97%.  HEENT:  Examination normocephalic, atraumatic.  Pupils equally round  reactive to light.  Extraocular movements are intact.  Funduscopic  benign.  Nares are patent bilaterally.  Oropharynx is clear.  No  exudates or erythema.  NECK:  Supple with full range of motion.  No thyromegaly, adenopathy or  jugular venous distention.  CARDIOVASCULAR:  Regular rate and rhythm.  No murmurs, gallops or rubs.  LUNGS:  Currently clear to auscultation after nebulizer treatments.  No  expiratory wheezes found.  ABDOMEN:  Positive bowel sounds, soft, nontender, nondistended.  EXTREMITIES:  Without cyanosis, clubbing or edema.  NEUROLOGIC:  Alert and oriented x3.  There are no focal deficits.   LABORATORY STUDIES:  White blood cell count 12, hemoglobin 13,  hematocrit 38.5, platelets 227,000, neutrophils 77% lymphocytes 13%.  Sodium 137, potassium 3.7, chloride 100, carbon dioxide 26, BUN 10,  creatinine 0.66 and glucose 113.  Cardiac enzymes with a CK total of 54,  CK-MB of 0.8 and troponin less than 0.01.  Chest x-ray reveals no acute  disease process.   ASSESSMENT:  49. A 76 year old female being admitted with Shortness of breath.  2. Mild hypoxia.  3. Acute bronchitis versus early pneumonia.  4. Asthma  exacerbation.  5. Hypertension.   PLAN:  The patient will be placed on IV antibiotic therapy of Avelox.  Nebulizer treatments have been ordered with albuterol and Atrovent along  with supplemental oxygen and an IV steroid taper will be started.  The  patient will have her regular medications verified and continued and DVT  and GI prophylaxis have been ordered.      Della Goo, M.D.  Electronically Signed     HJ/MEDQ  D:  06/15/2008  T:  06/15/2008  Job:  [id number]

## 2010-09-20 NOTE — Discharge Summary (Signed)
[name], Casey                    ACCOUNT NO.:  [id number]   MEDICAL RECORD NO.:  [id number]          PATIENT TYPE:  INP   LOCATION:  1445                         FACILITY:  [facility]   PHYSICIAN:  Charlestine Massed, MDDATE OF BIRTH:  09/11/1934   DATE OF ADMISSION:  06/14/2008  DATE OF DISCHARGE:  06/18/2008                               DISCHARGE SUMMARY   PRIMARY CARE PHYSICIAN:  Dr. Phillips Odor, [facility].   CARDIOLOGY:  Dr. Nanetta Batty of [facility].   REASON FOR ADMISSION:  Shortness of breath with cough.   HOSPITAL COURSE:  The 76 year old, [name], is a Caucasian female  who came to the emergency department with complaints of shortness of  breath, fever, cough, and chest tightness.  She has been having these  symptoms over the past 1 day.  Also, had fevers and chills and was  coughing up yellowish sputum.  No nausea, vomiting, or diarrhea.  No  myalgia.  No palpitations on admission.   Patient has prior history of asthma, type 2 diabetes, and hypertension  for which she has been taking medications regularly.   Patient was diagnosed with bronchitis/asthmatic bronchitis and was  started on antibiotics.  Chest x-ray did not reveal any infiltrate and  she was treated with nebulizers and she gradually improved.  She was  also treated with steroids and she was started on new medications for  asthma.   Patient's asthma status has been labeled as moderate, persistent asthma  at this time.  Patient was started on Advair, as well as Singulair for  her asthma.  Patient has been advised to take nebulizers at home as  needed.   Patient was put on droplet precautions in view of flu-like symptoms with  cough, started on Tamiflu.  Sputum influenza testing has been sent which  is still pending.   Patient has improved gradually.  Her peak flow has come to above 500 mL.  She has been also given incentive spirometry.  Patient has been taught  in  hospital to use a nebulizer machine.  Home care has also been  arranged for her to get help with using the nebulizer machine.   Echocardiography has revealed moderate mitral valvular regurgitation  with normal LV systolic function of 55% ejection fraction which in the  presence of MR is slightly lower value.  Patient has extreme wheezing  and the presence of beta-blockers in this case is possibly contributing  to her added bronchoconstriction.  Patient was started on lisinopril in  view of the existing mitral valve regurgitation as after load  reduction  will be very favorable for her mitral valve regurgitation.  Patient  started on lisinopril, dose has been increased up to 20 mg.  Patient has  been advised to follow up with PMD, as well as cardiology as outpatient  for MR.  Dose of lisinopril can be increased as needed as outpatient to  maximally stabilize her blood pressure to bring the systolic rating to  120 mmHg or below.   Patient is currently stable, afebrile, and she  is not short of breath  anymore.  She has been educated clearly about her disease status and  patient is being discharged.   DISCHARGE DIAGNOSES:  1. Asthmatic bronchitis.  2. Moderate persistent asthma.  3. Type 2 diabetes.  4. Hypertension.  5. Mitral valvular regurgitation.  6. Possible gastroesophageal reflux disease as per symptoms.   DISCHARGE MEDICATIONS:  1. Albuterol 2.5 mg nebulizer inhalation every 6 hours p.r.n.  2. Atrovent 0.5 mg nebulized inhalation every 6 hours p.r.n.  3. Lisinopril 20 mg p.o. daily.  4. Advair 250/50 one puff b.i.d.  5. Singulair 10 mg p.o. daily.  6. Tamiflu 75 mg p.o. b.i.d. for 4 days.  7. Synthroid 50 mcg p.o. daily.  8. Glipizide 5 mg p.o. q.a.m.  9. Byetta 10 mcg twice daily.  10.Prilosec 20 mg p.o. daily.  11.Prednisone 50 mg p.o. daily until June 20, 2008, and then      decrease by 10 mg daily.  12.Avelox 400 mg p.o. daily for 5 days.  13.Senna 2 tablets  p.o. q.h.s.  14.Cardizem CD 240 mg p.o. daily.  15.NovoLog coverage q.a.c. q.h.s. on a sensitive scale.   DISCHARGE INSTRUCTIONS:  1. Patient has been advised to use a facemask and stay in a private      room for further droplet precautions until Tamiflu course is      completed.  2. Has been advised to follow up with primary care doctor and by Dr.      Allyson Sabal as mentioned below  3. Patient has been educated to use albuterol nebulizers before going      out in the cold weather and also to come to the emergency room if      she gets worsening shortness of breath or fever.   FOLLOWUP:  1. Follow up with Dr. Phillips Odor at [facility] in 1      week.  2. Follow up with Dr. Nanetta Batty, cardiologist, [facility], in 1 month for evaluation and continuous management      of mitral regurgitation.   Patient has been advised to continue her prior home medications,  Cymbalta, metformin, amitriptyline, Lyrica, and trazodone.  She has been  advised to stop the medications of Lasix, Coreg, potassium  supplementations, and Benicar.   DISPOSITION:  Discharged back home.   A total of 45 minutes was spent in educating the patient about the  disease and assessing and in the discharge.      Charlestine Massed, MD  Electronically Signed     UT/MEDQ  D:  06/18/2008  T:  06/18/2008  Job:  [id number]   cc:   Nanetta Batty, M.D.  Fax: [phone]

## 2010-09-20 NOTE — Assessment & Plan Note (Signed)
OFFICE VISIT   Casey Harrell, Casey Harrell  DOB:  Jan 14, 1935                                       07/12/2009  CHART#:[id number]   Patient returns today for followup.  She underwent right carotid  endarterectomy with patch angioplasty on 03/22/2010 for asymptomatic  greater-than 80% stenosis.   The patient is doing well at this time.  Her only complaints are that of  some discomfort at her incision, which feels like a healing ridge from  scar tissue.  She is neurologically intact without deficits.   I told that her activity would be as tolerated.  She is scheduled to go  back to see Dr. Allyson Sabal for a repeat carotid ultrasound.  She will be  placed on a carotid ultrasound protocol at their office and followed by  them.  I would be happy to assist in any future needs of this patient.     Jorge Ny, MD  Electronically Signed   VWB/MEDQ  D:  07/12/2009  T:  07/13/2009  Job:  [id number]   cc:   Dr. Allyson Sabal

## 2010-09-20 NOTE — Procedures (Signed)
CAROTID DUPLEX EXAM   INDICATION:  Carotid disease.   HISTORY:  Diabetes:  Yes  Cardiac:  No  Hypertension:  Yes  Smoking:  No  Previous Surgery:  No  CV History:  Syncopal episodes a few weeks ago.  Amaurosis Fugax No, Paresthesias No, Hemiparesis No                                       RIGHT             LEFT  Brachial systolic pressure:         Antegrade  Brachial Doppler waveforms:  Vertebral direction of flow:  DUPLEX VELOCITIES (cm/sec)  CCA peak systolic                   63  ECA peak systolic                   75  ICA peak systolic                   305  ICA end diastolic                   117  PLAQUE MORPHOLOGY:                  Heterogeneous  PLAQUE AMOUNT:                      Moderate/severe  PLAQUE LOCATION:                    ICA/ECA   IMPRESSION:  Doppler velocities suggest an 80% to 99% stenosis of the  right proximal internal carotid artery.   ___________________________________________  V. Charlena Cross, MD   CH/MEDQ  D:  06/15/2009  T:  06/16/2009  Job:  [id number]

## 2010-09-22 ENCOUNTER — Ambulatory Visit: Payer: Medicare FFS | Admitting: Family Medicine

## 2010-09-22 ENCOUNTER — Encounter: Payer: Self-pay | Admitting: Family Medicine

## 2010-09-22 ENCOUNTER — Ambulatory Visit (INDEPENDENT_AMBULATORY_CARE_PROVIDER_SITE_OTHER): Payer: Medicare FFS | Admitting: Family Medicine

## 2010-09-22 DIAGNOSIS — E119 Type 2 diabetes mellitus without complications: Secondary | ICD-10-CM

## 2010-09-22 DIAGNOSIS — E1149 Type 2 diabetes mellitus with other diabetic neurological complication: Secondary | ICD-10-CM

## 2010-09-22 LAB — HEMOGLOBIN A1C: Hgb A1c MFr Bld: 7.3 % — ABNORMAL HIGH (ref 4.6–6.5)

## 2010-09-22 NOTE — Progress Notes (Signed)
  Subjective:    Patient ID: Casey Harrell, female    DOB: [birth year], 76 y.o.   MRN: [id number]  HPI Patient has multiple medical problems including hypothyroidism, type 2 diabetes, diabetic peripheral neuropathy, dyslipidemia, obstructive sleep apnea, hypertension, asthma, GERD, diverticulosis of the colon. Seen for followup regarding diabetes. Last A1c 6.9%. Takes Lantus 100 units daily and NovoLog usually 9 units in the morning and 14 units at night with supper. No hypoglycemia. No symptoms of hyperglycemia. 4 pound weight loss due to diet and exercise since last visit. Still not exercising.  Reviewed all medications. Compliant with all. No orthostasis. Denies chest pain. Eye exam last August and gets yearly.  She has diabetic peripheral neuropathy with pain mostly at night. Previously intolerant to gabapentin. Has taken Lyrica but stopped due to some fatigue and edema issues as well as cost.  Pain varies in intensity and mostly  Involves feet.   Review of Systems  Constitutional: Positive for fatigue. Negative for fever, chills, activity change and appetite change.  Cardiovascular: Negative for chest pain, palpitations and leg swelling.  Gastrointestinal: Negative for abdominal pain and abdominal distention.  Genitourinary: Negative for dysuria.  Neurological: Negative for dizziness, syncope and headaches.  Hematological: Negative for adenopathy.  Psychiatric/Behavioral: Negative for dysphoric mood.       Objective:   Physical Exam  Constitutional: She is oriented to person, place, and time. She appears well-developed and well-nourished.  HENT:  Right Ear: External ear normal.  Left Ear: External ear normal.  Mouth/Throat: No oropharyngeal exudate.  Neck: No thyromegaly present.  Cardiovascular: Normal rate, regular rhythm and normal heart sounds.   Pulmonary/Chest: Effort normal and breath sounds normal. No respiratory distress. She has no wheezes. She has no rales.  Abdominal:  Soft. Bowel sounds are normal.  Musculoskeletal: She exhibits no edema.  Lymphadenopathy:    She has no cervical adenopathy.  Neurological: She is alert and oriented to person, place, and time.  Psychiatric: She has a normal mood and affect. Her behavior is normal.          Assessment & Plan:  #1 type 2 diabetes. Recent good control. Reassess A1c. Continue yearly eye exam. Reviewed signs and symptoms of hypoglycemia #2 diabetic peripheral neuropathy. Patient takes low-dose amitriptyline and Cymbalta. Discussed adding back low-dose Lyrica but at this point she wishes to wait #3 hypertension stable continue weight loss efforts and more regular exercise

## 2010-09-23 NOTE — Op Note (Signed)
[facility]  Patient:    Casey Harrell, Casey Harrell                            MRN: [id number] Proc. Date: 12/14/00 Attending:  Sheppard Plumber. Earlene Plater, M.D.                           Operative Report  PREOPERATIVE DIAGNOSIS:  Incarcerated ventral incisional hernia.  POSTOPERATIVE DIAGNOSIS:  Lipoma abdominal wall.  PROCEDURE:  Exploration of abdominal wall and excision of lipoma.  SURGEON:  Timothy E. Earlene Plater, M.D.  ASSISTANT:  Catalina Lunger, M.D.  ANESTHESIA:  CRNA supervised Mack Guise, M.D.  INDICATIONS:  [name] is well known to me.  She is now 72, suffers with obesity, hypertension, diabetes.  She has had multiple previous surgeries, beginning with diverticulitis, colostomy, closure of colostomy, multiple explorations for bowel obstruction.  She presents with a mass in the left lower abdomen right under the ostomy site.  It does vary in size.  It bothers her rather intensely, particularly with activity.  She was examined in the supine, upright, and strain position.  This was felt to be an incarcerated ventral incisional hernia.  She was prepared for surgery at the time of her choice.  DESCRIPTION OF PROCEDURE:  The patient was brought to the operating room and placed supine, general endotracheal anesthesia administered.  The abdomen had been scrubbed.  It was prepped and draped in the usual fashion.  A generous horizontal incision made over this palpable mass, the subcutaneous tissue dissected.  A large, fatty mass was identified and, surprisingly, turned out to be an isolated subcutaneous lipoma.  We did proceed with the dissection and explored the fascia of the anterior abdominal wall, including the midline, and no herniation was found.  Anesthesia produced a Valsalva maneuver and, indeed, the abdominal wall was intact.  Therefore, the procedure was complete. Bleeding had been controlled with the cautery.  The wound was dry, and it was closed with wide  skin staples.  She tolerated it well, was awakened and taken to the recovery room in good condition.  Written and verbal instructions were given including Darvocet #36 for pain, and she will be followed as an outpatient. DD:  12/14/00 TD:  12/15/00 Job: [id number]

## 2010-09-23 NOTE — Progress Notes (Signed)
Quick Note:  Pt informed ______ 

## 2010-09-23 NOTE — Op Note (Signed)
[facility]  Patient:    Casey Harrell, Casey Harrell Visit Number: [id number] MRN: [id number]          Service Type: NES Location: [facility] Attending Physician:  Liborio Nixon Proc. Date: 06/24/01 Admit Date:  06/24/2001                             Operative Report  PREOPERATIVE DIAGNOSIS:  Stress urinary incontinence.  POSTOPERATIVE DIAGNOSIS:  Stress urinary incontinence.  PROCEDURE PERFORMED:  Sparc pubovaginal tape procedure.  SURGEON:  Bertram Millard. Dahlstedt, M.D.  ANESTHESIA:  General LMA.  COMPLICATIONS:  None.  ESTIMATED BLOOD LOSS:  100 cc.  INDICATION:  A 76 year old female with typical stress urinary incontinence. She also has an element of urgency incontinence.  Anticholinergics have helped the patients urgency.  She also has been on Estrace cream for approximately three months.  She still has significant stress incontinence which limits her activities.  She has been followed in my office since 9/02.  At this point, having failed conservative management, she desires surgical management.  Risks and complications of surgery have discussed with the patient.  I have suggested minimally invasive Sparc procedure.  She would like to proceed with this.  She is aware of the risks and complications including, but not limited to intestinal and bladder injury, continued incontinence, bleeding, infection, need for further surgery down the road.  She desires to proceed.  DESCRIPTION OF PROCEDURE:  The patient was administered a general anesthetic using the LMA.  She was placed in the dorsolithotomy position.  The genitalia, perineum, and lower abdomen prepped and draped.  Two puncture incisions were made in the suprapubic area laterally from the midline.  The anterior vaginal mucosa was infiltrated with 1% lidocaine with.  An incision was made along the urethra on the anterior midline of the vagina.  This was carried down to the pubocervical  fascia bilaterally.  The Sparc needles were placed through separate suprapubic stab incisions and walked down through the retroperitoneum along the posterior aspect of the pubic symphysis and brought through the vaginal incision bilaterally.  The tape was snapped into the needles and brought up loosely against the mid urethra.  A right angle was easily placed underneath the tape.  Once adequate positioning was felt to have been obtained, cystoscopy revealed no bladder or urethral injury.  The plastic sheath around the tape was removed from the sheath.  Once again, the tape was felt to be in adequate position.  The ends were cut underneath the skin.  The skin edges were closed using Dermabond.  The vaginal incision was closed using a running 2-0 Vicryl.  A vaginal pack was placed.  Dry sterile dressings were placed.  The patient tolerated the procedure well.  She was left with approximately 200 cc of urine in her bladder for a voiding trial in the PACU. She will follow up with me in two weeks.  The catheter will be placed in the PACU if patient fails her voiding trial. Attending Physician:  Liborio Nixon DD:  06/24/01 TD:  06/24/01 Job: [id number]

## 2010-11-23 ENCOUNTER — Other Ambulatory Visit: Payer: Self-pay | Admitting: Family Medicine

## 2010-12-07 ENCOUNTER — Ambulatory Visit (INDEPENDENT_AMBULATORY_CARE_PROVIDER_SITE_OTHER): Payer: Medicare FFS | Admitting: Family Medicine

## 2010-12-07 ENCOUNTER — Encounter: Payer: Self-pay | Admitting: Family Medicine

## 2010-12-07 DIAGNOSIS — J329 Chronic sinusitis, unspecified: Secondary | ICD-10-CM

## 2010-12-07 DIAGNOSIS — J45909 Unspecified asthma, uncomplicated: Secondary | ICD-10-CM

## 2010-12-07 DIAGNOSIS — E119 Type 2 diabetes mellitus without complications: Secondary | ICD-10-CM

## 2010-12-07 MED ORDER — HYDROCODONE-HOMATROPINE 5-1.5 MG/5ML PO SYRP: 5.0000 mL | ORAL_SOLUTION | Freq: Four times a day (QID) | ORAL | Status: DC | PRN

## 2010-12-07 MED ORDER — METHYLPREDNISOLONE ACETATE 80 MG/ML IJ SUSP
80.0000 mg | Freq: Once | INTRAMUSCULAR | Status: AC
  Administered 2010-12-07: 80 mg via INTRAMUSCULAR

## 2010-12-07 MED ORDER — AZITHROMYCIN 250 MG PO TABS: ORAL_TABLET | ORAL | Status: AC

## 2010-12-07 NOTE — Progress Notes (Signed)
  Subjective:    Patient ID: Casey Harrell, female    DOB: Jun 01, 1934, 76 y.o.   MRN: [id number]  HPI Patient seen as a work in with some progressive cough, dyspnea, wheezing and frontal sinus pressure over the past week. She describes yellowish nasal discharge and cough productive of yellow sputum. No fever. She has home nebulizer which she is using. She reports home oxygen which she has not been using.  Type 2 diabetes treated with insulin. CBGs been stable recently. Most recent A1c 7.3%.   Review of Systems  Constitutional: Positive for fatigue. Negative for fever and chills.  HENT: Positive for congestion and sinus pressure. Negative for sore throat.   Respiratory: Positive for cough, shortness of breath and wheezing.   Cardiovascular: Negative for chest pain, palpitations and leg swelling.       Objective:   Physical Exam  Constitutional: She appears well-developed and well-nourished. No distress.  HENT:  Right Ear: External ear normal.  Left Ear: External ear normal.       Tongue is slightly dry. Posterior pharynx clear. No exudate.  Neck: Neck supple.  Cardiovascular: Normal rate and regular rhythm.   Pulmonary/Chest: Effort normal.       Patient has some diffuse wheezes. No rales. No retractions  Musculoskeletal: She exhibits no edema.  Lymphadenopathy:    She has no cervical adenopathy.          Assessment & Plan:  Asthma exacerbation with probable acute sinusitis. Start Zithromax. Depo-Medrol 80 mg IM given. Follow blood sugars closely. Continue home nebulizer. Hycodan to use rarely for severe cough

## 2010-12-07 NOTE — Patient Instructions (Signed)
Get back on home Oxygen for now. Follow up promptly for any increasing fever or worsening shortness of breath.

## 2010-12-09 ENCOUNTER — Telehealth: Payer: Self-pay | Admitting: Family Medicine

## 2010-12-09 MED ORDER — ALBUTEROL SULFATE 1.25 MG/3ML IN NEBU: 1.0000 | INHALATION_SOLUTION | Freq: Four times a day (QID) | RESPIRATORY_TRACT | Status: DC | PRN

## 2010-12-09 NOTE — Telephone Encounter (Signed)
Need new rx for Albuterol for ner nebulizer. [facility] to [facility]. Dr Caryl Never told patient that she will need to start back using the nebulizer.

## 2010-12-14 ENCOUNTER — Other Ambulatory Visit: Payer: Self-pay | Admitting: *Deleted

## 2010-12-14 NOTE — Telephone Encounter (Signed)
Faxed refill request for Hydrocodone-Homatropine syrup 120 ml.  Last filled 12/07/10

## 2010-12-15 MED ORDER — HYDROCODONE-HOMATROPINE 5-1.5 MG/5ML PO SYRP: 5.0000 mL | ORAL_SOLUTION | Freq: Four times a day (QID) | ORAL | Status: DC | PRN

## 2010-12-15 NOTE — Telephone Encounter (Signed)
Refill once OK. 

## 2010-12-15 NOTE — Telephone Encounter (Signed)
Pt informed Rx called in. 

## 2011-01-01 ENCOUNTER — Other Ambulatory Visit: Payer: Self-pay | Admitting: Family Medicine

## 2011-01-03 ENCOUNTER — Ambulatory Visit (INDEPENDENT_AMBULATORY_CARE_PROVIDER_SITE_OTHER): Payer: Medicare FFS | Admitting: Internal Medicine

## 2011-01-03 ENCOUNTER — Encounter: Payer: Self-pay | Admitting: Internal Medicine

## 2011-01-03 DIAGNOSIS — E119 Type 2 diabetes mellitus without complications: Secondary | ICD-10-CM

## 2011-01-03 DIAGNOSIS — J45909 Unspecified asthma, uncomplicated: Secondary | ICD-10-CM

## 2011-01-03 DIAGNOSIS — I10 Essential (primary) hypertension: Secondary | ICD-10-CM

## 2011-01-03 MED ORDER — HYDROCODONE-HOMATROPINE 5-1.5 MG/5ML PO SYRP: 5.0000 mL | ORAL_SOLUTION | Freq: Four times a day (QID) | ORAL | Status: AC | PRN

## 2011-01-03 MED ORDER — METHYLPREDNISOLONE ACETATE 80 MG/ML IJ SUSP
80.0000 mg | Freq: Once | INTRAMUSCULAR | Status: AC
  Administered 2011-01-03: 80 mg via INTRAMUSCULAR

## 2011-01-03 MED ORDER — MOMETASONE FURO-FORMOTEROL FUM 200-5 MCG/ACT IN AERO: 1.0000 | INHALATION_SPRAY | Freq: Two times a day (BID) | RESPIRATORY_TRACT | Status: DC

## 2011-01-03 NOTE — Progress Notes (Signed)
  Subjective:    Patient ID: Casey Harrell, female    DOB: 1934-06-08, 76 y.o.   MRN: [id number]  HPI  76 year old patient who is seen today for followup of her asthma. She was treated here 3 weeks ago with Depo-Medrol for the acute asthmatic bronchitis. She was treated with azithromycin at that time. She does have a history of type 2 diabetes. She seemed to improve her earlier but now has had worsening shortness of breath with wheezing. She does have available home nebulizer treatment but she states that she does uses her albuterol once or twice daily. She is on no maintenance asthma medication. Her diabetes has been stable. Cough is nonproductive.    Review of Systems  Constitutional: Negative.   HENT: Negative for hearing loss, congestion, sore throat, rhinorrhea, dental problem, sinus pressure and tinnitus.   Eyes: Negative for pain, discharge and visual disturbance.  Respiratory: Positive for cough, shortness of breath and wheezing. Negative for chest tightness and stridor.   Cardiovascular: Negative for chest pain, palpitations and leg swelling.  Gastrointestinal: Negative for nausea, vomiting, abdominal pain, diarrhea, constipation, blood in stool and abdominal distention.  Genitourinary: Negative for dysuria, urgency, frequency, hematuria, flank pain, vaginal bleeding, vaginal discharge, difficulty urinating, vaginal pain and pelvic pain.  Musculoskeletal: Negative for joint swelling, arthralgias and gait problem.  Skin: Negative for rash.  Neurological: Negative for dizziness, syncope, speech difficulty, weakness, numbness and headaches.  Hematological: Negative for adenopathy.  Psychiatric/Behavioral: Negative for behavioral problems, dysphoric mood and agitation. The patient is not nervous/anxious.        Objective:   Physical Exam  Constitutional: She is oriented to person, place, and time. She appears well-developed and well-nourished. No distress.       Overweight. Nontoxic.  Temperature 97.7 random blood sugar 111  HENT:  Head: Normocephalic.  Right Ear: External ear normal.  Left Ear: External ear normal.  Mouth/Throat: Oropharynx is clear and moist.  Eyes: Conjunctivae and EOM are normal. Pupils are equal, round, and reactive to light.  Neck: Normal range of motion. Neck supple. No thyromegaly present.  Cardiovascular: Normal rate, regular rhythm, normal heart sounds and intact distal pulses.   Pulmonary/Chest: Effort normal.       Patient has some mild resting tachypnea but no increased work of breathing. She is slightly hoarse. Breath sounds were essentially clear with the patient sitting but she did note to have some expiratory wheezing in the supine position Oxygen  saturation 97% Pulse rate 62  Abdominal: Soft. Bowel sounds are normal. She exhibits no mass. There is no tenderness.  Musculoskeletal: Normal range of motion. She exhibits no edema.  Lymphadenopathy:    She has no cervical adenopathy.  Neurological: She is alert and oriented to person, place, and time.  Skin: Skin is warm and dry. No rash noted.  Psychiatric: She has a normal mood and affect. Her behavior is normal.          Assessment & Plan:   Refractory asthmatic bronchitis. The patient was given a second Depo-Medrol 80 mg IM she was told to use her nebulizer treatments every 6 hours. Expectorants also recommended. She has been asked to increase her fluid intake. She was placed on Dulera 1 LH and twice daily. She is to rest return when necessary or in 2 weeks for followup. She is also asked to track her blood sugars more closely due to her acute illness and steroid therapy

## 2011-01-03 NOTE — Patient Instructions (Addendum)
Dulera 1 puff twice daily  Mucinex DM one twice daily  Use albuterol nebulizer treatment 4 times daily  Return office visit in 48 hours if not dramatically improved Drink as much fluid as you  can tolerate over the next few days  Return office visit 2 weeks  Track blood sugars more carefully over the next 2 weeks

## 2011-01-23 ENCOUNTER — Ambulatory Visit (INDEPENDENT_AMBULATORY_CARE_PROVIDER_SITE_OTHER)
Admission: RE | Admit: 2011-01-23 | Discharge: 2011-01-23 | Disposition: A | Discharge status: Home / Self Care | Payer: Medicare FFS | Source: Ambulatory Visit | Attending: Family Medicine | Admitting: Family Medicine

## 2011-01-23 ENCOUNTER — Encounter: Payer: Self-pay | Admitting: Family Medicine

## 2011-01-23 ENCOUNTER — Ambulatory Visit (INDEPENDENT_AMBULATORY_CARE_PROVIDER_SITE_OTHER): Payer: Medicare FFS | Admitting: Family Medicine

## 2011-01-23 DIAGNOSIS — R05 Cough: Secondary | ICD-10-CM

## 2011-01-23 DIAGNOSIS — I10 Essential (primary) hypertension: Secondary | ICD-10-CM

## 2011-01-23 DIAGNOSIS — R059 Cough, unspecified: Secondary | ICD-10-CM

## 2011-01-23 DIAGNOSIS — E119 Type 2 diabetes mellitus without complications: Secondary | ICD-10-CM

## 2011-01-23 DIAGNOSIS — E039 Hypothyroidism, unspecified: Secondary | ICD-10-CM

## 2011-01-23 LAB — HEMOGLOBIN A1C: Hgb A1c MFr Bld: 7 % — ABNORMAL HIGH (ref 4.6–6.5)

## 2011-01-23 LAB — TSH: TSH: 1.22 u[IU]/mL (ref 0.35–5.50)

## 2011-01-23 MED ORDER — AZELASTINE HCL 0.1 % NA SOLN: 2.0000 | Freq: Two times a day (BID) | NASAL | Status: DC

## 2011-01-23 NOTE — Progress Notes (Signed)
  Subjective:    Patient ID: Casey Harrell, female    DOB: 29-Jan-1935, 76 y.o.   MRN: [id number]  HPI Medical followup. Patient has hypothyroidism, type 2 diabetes, history of peripheral neuropathy, dyslipidemia, obstructive sleep apnea, hypertension, GERD, and asthma. Diabetes stable. Fasting blood sugars reviewed and fairly well controlled. Last A1c 7.3%. No hypoglycemia. Taking NovoLog 9 units twice daily and Lantus 120 units daily.  Hypothyroidism treated with levothyroxin. Compliant with therapy. Needs repeat labs.  Chronic cough for several months. Dry cough. No hemoptysis. No dyspnea. No history of smoking. She is aware some postnasal drip and occasional reflux symptoms. Takes Nexium. Has elevated head of bed 6-8 inches. Recently has taken Hycodan cough syrup which helps. No appetite or weight changes  Past Medical History  Diagnosis Date  . DIABETES-TYPE 2 07/15/2008  . DIABETIC PERIPHERAL NEUROPATHY 09/02/2009  . HYPERTENSION 07/15/2008  . ASTHMA 07/15/2008  . GERD 07/15/2008   Past Surgical History  Procedure Date  . Appendectomy 1971  . Abdominal hysterectomy 1971  . Tonsillectomy   . Tubal ligation   . Cesarean section   . Flexible sigmoidoscopy     diverticulitis  . Laparotomy     X 3 with adhesions lysis  . Foot surgery     right foot X 2    reports that she has never smoked. She does not have any smokeless tobacco history on file. Her alcohol and drug histories not on file. family history includes Arthritis in her mother; Cancer in her paternal grandfather; Celiac disease in her sister; Diabetes in her mother; Heart disease in her father; and Stroke in her maternal grandfather. Allergies  Allergen Reactions  . Doxycycline Hyclate     REACTION: vomiting  . Iohexol      Code: HIVES, Desc: pt. states she breaks out in hives 06/15/08   . Morphine Sulfate     REACTION: hallucinations  . Moxifloxacin     REACTION: unknown      Review of Systems  Constitutional:  Negative for fever, chills, appetite change and unexpected weight change.  HENT: Positive for postnasal drip. Negative for sneezing and sinus pressure.   Respiratory: Positive for cough. Negative for shortness of breath and wheezing.   Cardiovascular: Negative for chest pain, palpitations and leg swelling.  Gastrointestinal: Negative for abdominal pain.  Neurological: Negative for dizziness and headaches.  Hematological: Negative for adenopathy.       Objective:   Physical Exam  Constitutional: She appears well-developed and well-nourished. No distress.  HENT:  Right Ear: External ear normal.  Left Ear: External ear normal.  Mouth/Throat: Oropharynx is clear and moist.  Neck: Neck supple. No thyromegaly present.  Cardiovascular: Normal rate and regular rhythm.   Pulmonary/Chest: Effort normal and breath sounds normal. No respiratory distress. She has no wheezes. She has no rales.  Musculoskeletal: She exhibits no edema.  Lymphadenopathy:    She has no cervical adenopathy.          Assessment & Plan:  #1 chronic cough. Possibly related to allergic postnasal drip. Astelin 1-2 sprays per nostril twice daily chest x-ray given duration of cough #2 type 2 diabetes. Reassess A1c. #3 hypothyroidism. Recheck TSH

## 2011-01-24 NOTE — Progress Notes (Signed)
Quick Note:  Pt informed on home VM ______ 

## 2011-01-25 NOTE — Progress Notes (Signed)
Quick Note:  Pt informed ______ 

## 2011-02-15 ENCOUNTER — Other Ambulatory Visit: Payer: Self-pay | Admitting: Family Medicine

## 2011-03-20 ENCOUNTER — Encounter: Payer: Self-pay | Admitting: Family Medicine

## 2011-03-20 LAB — HM DIABETES EYE EXAM

## 2011-04-11 ENCOUNTER — Other Ambulatory Visit: Payer: Self-pay | Admitting: *Deleted

## 2011-04-11 MED ORDER — DULOXETINE HCL 60 MG PO CPEP: 60.0000 mg | ORAL_CAPSULE | Freq: Every day | ORAL | Status: DC

## 2011-04-19 ENCOUNTER — Other Ambulatory Visit: Payer: Self-pay | Admitting: Family Medicine

## 2011-05-16 ENCOUNTER — Ambulatory Visit (INDEPENDENT_AMBULATORY_CARE_PROVIDER_SITE_OTHER): Payer: Medicare FFS | Admitting: Family Medicine

## 2011-05-16 ENCOUNTER — Encounter: Payer: Self-pay | Admitting: Family Medicine

## 2011-05-16 VITALS — BP 140/82 | Temp 97.6°F | Wt 162.0 lb

## 2011-05-16 DIAGNOSIS — E785 Hyperlipidemia, unspecified: Secondary | ICD-10-CM

## 2011-05-16 DIAGNOSIS — E119 Type 2 diabetes mellitus without complications: Secondary | ICD-10-CM

## 2011-05-16 DIAGNOSIS — R059 Cough, unspecified: Secondary | ICD-10-CM

## 2011-05-16 DIAGNOSIS — I10 Essential (primary) hypertension: Secondary | ICD-10-CM

## 2011-05-16 DIAGNOSIS — R05 Cough: Secondary | ICD-10-CM

## 2011-05-16 LAB — BASIC METABOLIC PANEL
BUN: 19 mg/dL (ref 6–23)
CO2: 29 mEq/L (ref 19–32)
Calcium: 9.6 mg/dL (ref 8.4–10.5)
Chloride: 101 mEq/L (ref 96–112)
Creatinine, Ser: 1 mg/dL (ref 0.4–1.2)
GFR: 58.27 mL/min — ABNORMAL LOW (ref >60.00)
Glucose, Bld: 193 mg/dL — ABNORMAL HIGH (ref 70–99)
Potassium: 5.3 mEq/L — ABNORMAL HIGH (ref 3.5–5.1)
Sodium: 138 mEq/L (ref 135–145)

## 2011-05-16 LAB — LIPID PANEL
Cholesterol: 83 mg/dL (ref 0–200)
HDL: 28.6 mg/dL — ABNORMAL LOW (ref >39.00)
LDL Cholesterol: 28 mg/dL (ref 0–99)
Total CHOL/HDL Ratio: 3
Triglycerides: 133 mg/dL (ref 0.0–149.0)
VLDL: 26.6 mg/dL (ref 0.0–40.0)

## 2011-05-16 LAB — HEPATIC FUNCTION PANEL
ALT: 21 U/L (ref 0–35)
AST: 25 U/L (ref 0–37)
Albumin: 3.9 g/dL (ref 3.5–5.2)
Alkaline Phosphatase: 76 U/L (ref 39–117)
Bilirubin, Direct: 0.1 mg/dL (ref 0.0–0.3)
Total Bilirubin: 0.4 mg/dL (ref 0.3–1.2)
Total Protein: 7.4 g/dL (ref 6.0–8.3)

## 2011-05-16 LAB — HEMOGLOBIN A1C: Hgb A1c MFr Bld: 7.9 % — ABNORMAL HIGH (ref 4.6–6.5)

## 2011-05-16 MED ORDER — ALBUTEROL SULFATE 1.25 MG/3ML IN NEBU: 1.0000 | INHALATION_SOLUTION | Freq: Four times a day (QID) | RESPIRATORY_TRACT | Status: DC | PRN

## 2011-05-16 MED ORDER — MOMETASONE FURO-FORMOTEROL FUM 200-5 MCG/ACT IN AERO: 2.0000 | INHALATION_SPRAY | Freq: Two times a day (BID) | RESPIRATORY_TRACT | Status: DC

## 2011-05-16 NOTE — Patient Instructions (Signed)
May titrate Lantus up 2 units every 3 days until fasting blood sugars are < 130

## 2011-05-16 NOTE — Progress Notes (Signed)
  Subjective:    Patient ID: Casey Harrell, female    DOB: [birth year], 77 y.o.   MRN: [id number]  HPI  Basis for medical followup. She has hypothyroidism, obesity, type 2 diabetes, peripheral neuropathy, obstructive sleep apnea, hypertension, asthma, GERD. She's had persistent cough for several weeks. Nonproductive mostly. No increase in dyspnea. Also wheezing. In reviewing medications she is not taking her steroid inhaler and not clear why. She denies hemoptysis. Recent chest x-ray in September no acute findings. Has never smoked. No appetite or weight changes.  Type 2 diabetes. Recent A1c 7.0%. Poor compliance with diet. No regular exercise. Recent fasting blood sugars elevated around135-170. No symptoms of hyperglycemia.  Hypertension with medications reviewed. Compliant with all. Blood pressure stable.  Denies chest pains, headaches, or dizziness.  Past Medical History  Diagnosis Date  . DIABETES-TYPE 2 07/15/2008  . DIABETIC PERIPHERAL NEUROPATHY 09/02/2009  . HYPERTENSION 07/15/2008  . ASTHMA 07/15/2008  . GERD 07/15/2008   Past Surgical History  Procedure Date  . Appendectomy 1971  . Abdominal hysterectomy 1971  . Tonsillectomy   . Tubal ligation   . Cesarean section   . Flexible sigmoidoscopy     diverticulitis  . Laparotomy     X 3 with adhesions lysis  . Foot surgery     right foot X 2    reports that she has never smoked. She does not have any smokeless tobacco history on file. Her alcohol and drug histories not on file. family history includes Arthritis in her mother; Cancer in her paternal grandfather; Celiac disease in her sister; Diabetes in her mother; Heart disease in her father; and Stroke in her maternal grandfather. Allergies  Allergen Reactions  . Doxycycline Hyclate     REACTION: vomiting  . Iohexol      Code: HIVES, Desc: pt. states she breaks out in hives 06/15/08   . Morphine Sulfate     REACTION: hallucinations  . Moxifloxacin     REACTION: unknown       Review of Systems  Constitutional: Negative for fatigue.  Eyes: Negative for visual disturbance.  Respiratory: Negative for cough, chest tightness, shortness of breath and wheezing.   Cardiovascular: Negative for chest pain, palpitations and leg swelling.  Neurological: Negative for dizziness, seizures, syncope, weakness, light-headedness and headaches.       Objective:   Physical Exam  Constitutional: She is oriented to person, place, and time. She appears well-developed and well-nourished.  HENT:  Right Ear: External ear normal.  Left Ear: External ear normal.  Mouth/Throat: Oropharynx is clear and moist.  Neck: Neck supple. No thyromegaly present.  Cardiovascular: Normal rate and regular rhythm.   Pulmonary/Chest: Effort normal and breath sounds normal. No respiratory distress. She has no wheezes. She has no rales.  Musculoskeletal: She exhibits no edema.  Neurological: She is alert and oriented to person, place, and time.          Assessment & Plan:  #1 type 2 diabetes. Poor compliance. Reassess A1c.  Titrate Lantus as indicated #2 persistent cough. History of asthma. ?reactive airway related.   Get back on Dulera 200 mg 2 puffs twice daily #3 hypertension. Stable. Continue current medications. Work on weight loss #4 hyperlipidemia.  Recheck lipids and hepatic.

## 2011-05-18 NOTE — Progress Notes (Signed)
Quick Note:  Pt informed and she voiced her understanding ______ 

## 2011-05-31 ENCOUNTER — Encounter: Payer: Self-pay | Admitting: Family Medicine

## 2011-05-31 ENCOUNTER — Ambulatory Visit (INDEPENDENT_AMBULATORY_CARE_PROVIDER_SITE_OTHER): Payer: Medicare FFS | Admitting: Family Medicine

## 2011-05-31 VITALS — BP 120/82 | Temp 97.8°F | Wt 162.0 lb

## 2011-05-31 DIAGNOSIS — J45909 Unspecified asthma, uncomplicated: Secondary | ICD-10-CM

## 2011-05-31 DIAGNOSIS — R05 Cough: Secondary | ICD-10-CM

## 2011-05-31 DIAGNOSIS — R059 Cough, unspecified: Secondary | ICD-10-CM

## 2011-05-31 DIAGNOSIS — R053 Chronic cough: Secondary | ICD-10-CM

## 2011-05-31 MED ORDER — HYDROCODONE-HOMATROPINE 5-1.5 MG/5ML PO SYRP: 5.0000 mL | ORAL_SOLUTION | Freq: Four times a day (QID) | ORAL | Status: DC | PRN

## 2011-05-31 NOTE — Patient Instructions (Addendum)
We will call you regarding pulmonary referral Follow up promptly for any fever or worsening symptoms. Avoid mentholated products.

## 2011-05-31 NOTE — Progress Notes (Signed)
Subjective:    Patient ID: Casey Harrell, female    DOB: August 13, 1934, 77 y.o.   MRN: [id number]  HPI  Patient seen with chronic cough which waxes and wanes. Has never smoked. History of asthma. She complains of daily dry cough especially worsening over the past couple of weeks-but present for months. No ACE inhibitor use. History of GERD and takes Nexium regularly and has previously elevated head of bed. No active reflux symptoms.  Has used Singulair in the past without much improvement. Recently started back on Dulera and using twice daily without improvement. She has history of allergic rhinitis and has some occasional postnasal drip. Has tried nasal steroids and Astelin nasal without much improvement. She's had previous immunotherapy several years ago which did not seem to help. Denies chronic sinusitis symptoms.  Chest x-ray last September showed chronic bronchitis changes but no acute finding. She's taken Delsym cough syrup without relief. Cough possibly worse at night. Has multiple allergies to pollens and things like dust mite and she's taken some measures to reduce dust.  Patient denies any appetite or weight changes. No hemoptysis. No increased dyspnea above baseline.  Past Medical History  Diagnosis Date  . DIABETES-TYPE 2 07/15/2008  . DIABETIC PERIPHERAL NEUROPATHY 09/02/2009  . HYPERTENSION 07/15/2008  . ASTHMA 07/15/2008  . GERD 07/15/2008   Past Surgical History  Procedure Date  . Appendectomy 1971  . Abdominal hysterectomy 1971  . Tonsillectomy   . Tubal ligation   . Cesarean section   . Flexible sigmoidoscopy     diverticulitis  . Laparotomy     X 3 with adhesions lysis  . Foot surgery     right foot X 2    reports that she has never smoked. She does not have any smokeless tobacco history on file. Her alcohol and drug histories not on file. family history includes Arthritis in her mother; Cancer in her paternal grandfather; Celiac disease in her sister; Diabetes in her  mother; Heart disease in her father; and Stroke in her maternal grandfather. Allergies  Allergen Reactions  . Doxycycline Hyclate     REACTION: vomiting  . Iohexol      Code: HIVES, Desc: pt. states she breaks out in hives 06/15/08   . Morphine Sulfate     REACTION: hallucinations  . Moxifloxacin     REACTION: unknown      Review of Systems  Constitutional: Negative for fever, chills, appetite change and unexpected weight change.  HENT: Positive for congestion. Negative for sore throat and postnasal drip.   Respiratory: Positive for cough. Negative for shortness of breath and wheezing.   Cardiovascular: Negative for chest pain, palpitations and leg swelling.  Neurological: Negative for dizziness.  Hematological: Negative for adenopathy.       Objective:   Physical Exam  Constitutional: She appears well-developed and well-nourished.  HENT:  Right Ear: External ear normal.  Left Ear: External ear normal.  Mouth/Throat: Oropharynx is clear and moist.  Neck: Neck supple.  Cardiovascular: Normal rate and regular rhythm.   Pulmonary/Chest: Effort normal and breath sounds normal. No respiratory distress. She has no wheezes. She has no rales.  Musculoskeletal: She exhibits no edema.  Lymphadenopathy:    She has no cervical adenopathy.  Neurological: She is alert.          Assessment & Plan:  Chronic cough. She has history of asthma but no active wheezing on exam today with pulse oximetry 97%. Consistent use of anti inflammatory with Dulera.  She  has history of GERD but is currently treated with no active symptoms. No chronic sinusitis symptoms. No ACE inhibitor use.  Long history of allergies and has tried multiple medications without much improvement.   Hycodan cough syrup for nighttime use as needed. Avoid mentholated cough medications and drops. Pulmonary referral given duration of symptoms

## 2011-06-05 ENCOUNTER — Encounter: Payer: Self-pay | Admitting: Pulmonary Disease

## 2011-06-05 ENCOUNTER — Ambulatory Visit (INDEPENDENT_AMBULATORY_CARE_PROVIDER_SITE_OTHER): Payer: Medicare FFS | Admitting: Pulmonary Disease

## 2011-06-05 ENCOUNTER — Ambulatory Visit (INDEPENDENT_AMBULATORY_CARE_PROVIDER_SITE_OTHER)
Admission: RE | Admit: 2011-06-05 | Discharge: 2011-06-05 | Disposition: A | Discharge status: Home / Self Care | Payer: Medicare FFS | Source: Ambulatory Visit | Attending: Pulmonary Disease | Admitting: Pulmonary Disease

## 2011-06-05 VITALS — BP 138/86 | HR 77 | Temp 98.2°F | Ht 62.0 in | Wt 165.6 lb

## 2011-06-05 DIAGNOSIS — R053 Chronic cough: Secondary | ICD-10-CM

## 2011-06-05 DIAGNOSIS — R059 Cough, unspecified: Secondary | ICD-10-CM

## 2011-06-05 DIAGNOSIS — R05 Cough: Secondary | ICD-10-CM

## 2011-06-05 NOTE — Patient Instructions (Addendum)
Change dulera to the lower strength 100/5.  Take 2 inhalations am and pm, rinse well.  The higher strength can irritate the back of the throat. Get chlorpheniramine 8mg  and take at bedtime everynight Take nexium am AND pm.  No throat clearing.  Use hard candy to bathe back of throat.  No mint, menthol Limit voice use.  Stay off phone, no singing or yelling. Will check cxr today for completeness, and will call you with results. followup with Dr. Craige Cotta in 2 weeks to check on progress.

## 2011-06-05 NOTE — Assessment & Plan Note (Signed)
The patient has a chronic cough that has been long-standing over years, and based on her description it is more upper airway than lower.  Her chest is clear today, and I think it's unlikely to be secondary to asthma.  She has postnasal drip, and also a significant history of reflux disease.  I suspect she also has a cyclical cough mechanism related to throat clearing and ongoing cough.  I will check a chest x-ray today for completeness, but will primarily focus on treatment of upper airway cough.  Will treat with an antihistamine, intensify her proton pump inhibitor regimen, and have reviewed with her the behavioral therapies for cyclical coughing.  Also decrease her dulera to the lower strength since the higher dose can irritate the upper airway.

## 2011-06-05 NOTE — Progress Notes (Signed)
  Subjective:    Patient ID: Casey Harrell, female    DOB: 1934-09-02, 77 y.o.   MRN: [id number]  HPI The patient comes in today for an acute sick visit secondary to chronic cough.  She is usually followed by Dr. Craige Cotta for asthma and chronic cough.  She has not been seen since 2010.  She comes in today where her cough has been increasing in severity, and is clearly worse at night and when lying down.  Patient also notes postnasal drip, and the husband notes that she clears her throat frequently.  She has a history of asthma, and is currently on high-dose dulera.  The patient has a history of reflux disease, and this is felt to be an issue for her with respect to the cough.   Review of Systems  Constitutional: Positive for unexpected weight change. Negative for fever.  HENT: Negative for ear pain, nosebleeds, congestion, sore throat, rhinorrhea, sneezing, trouble swallowing, dental problem, postnasal drip and sinus pressure.   Eyes: Negative for redness and itching.  Respiratory: Positive for cough and shortness of breath. Negative for chest tightness and wheezing.   Cardiovascular: Negative for palpitations and leg swelling.  Gastrointestinal: Negative for nausea and vomiting.  Genitourinary: Negative for dysuria.  Musculoskeletal: Negative for joint swelling.  Skin: Negative for rash.  Neurological: Negative for headaches.  Hematological: Does not bruise/bleed easily.  Psychiatric/Behavioral: Negative for dysphoric mood. The patient is not nervous/anxious.        Objective:   Physical Exam Obese female in no acute distress Nose without purulence or discharge noted Oropharynx clear, no exudates Chest totally clear to auscultation, no wheezing Cardiac exam with regular rate and rhythm, 2/6 systolic murmur Lower extremities without significant edema, no cyanosis noted Alert and oriented, moves all 4 extremities.       Assessment & Plan:

## 2011-06-16 ENCOUNTER — Other Ambulatory Visit: Payer: Self-pay | Admitting: Family Medicine

## 2011-06-16 MED ORDER — ESOMEPRAZOLE MAGNESIUM 40 MG PO CPDR: 40.0000 mg | DELAYED_RELEASE_CAPSULE | Freq: Two times a day (BID) | ORAL | Status: DC

## 2011-06-16 NOTE — Telephone Encounter (Signed)
Pt stated Dr Shelle Iron doubled her med  nexium 40 mg twice a day. Please call [facility] [phone]. Pt is out

## 2011-06-19 ENCOUNTER — Encounter: Payer: Self-pay | Admitting: Adult Health

## 2011-06-19 ENCOUNTER — Ambulatory Visit (INDEPENDENT_AMBULATORY_CARE_PROVIDER_SITE_OTHER): Payer: Medicare FFS | Admitting: Adult Health

## 2011-06-19 VITALS — BP 132/84 | HR 84 | Temp 99.5°F | Ht 61.5 in | Wt 165.0 lb

## 2011-06-19 DIAGNOSIS — J45909 Unspecified asthma, uncomplicated: Secondary | ICD-10-CM

## 2011-06-19 NOTE — Patient Instructions (Signed)
Continue on current regimen.  follow up Dr. Craige Cotta  In 2 months and As needed   May take chlorpheniramine 8mg  and take at bedtime every night for drainage

## 2011-06-19 NOTE — Assessment & Plan Note (Signed)
Recent flare with cough Now resolved.  Cont on current regimen  follow up Dr. Craige Cotta  In 2 months and As needed

## 2011-06-19 NOTE — Progress Notes (Signed)
  Subjective:    Patient ID: Casey Harrell, female    DOB: 1934/09/15, 77 y.o.   MRN: [id number]  HPI 77 yo WF with known hx of Asthma and Chronic cough   06/19/2011 Follow up  Pt returns for follow up . Last ov with cough flare tx with GERD and rhinits prevention regimen along with cyclic cough rx. Dulera was decreased . She returns today feeling much better. Cough is decreased. She denies chest pain, orthopena or fever.  cXR last ov with no acute process .    Review of Systems Constitutional:   No  weight loss, night sweats,  Fevers, chills, fatigue, or  lassitude.  HEENT:   No headaches,  Difficulty swallowing,  Tooth/dental problems, or  Sore throat,                No sneezing, itching, ear ache, nasal congestion, post nasal drip,   CV:  No chest pain,  Orthopnea, PND, swelling in lower extremities, anasarca, dizziness, palpitations, syncope.   GI  No heartburn, indigestion, abdominal pain, nausea, vomiting, diarrhea, change in bowel habits, loss of appetite, bloody stools.   Resp: No shortness of breath with exertion or at rest.     No non-productive cough,  No coughing up of blood.  No change in color of mucus.  No wheezing.  No chest wall deformity  Skin: no rash or lesions.  GU: no dysuria, change in color of urine, no urgency or frequency.  No flank pain, no hematuria   MS:  No joint pain or swelling.  No decreased range of motion.  No back pain.  Psych:  No change in mood or affect. No depression or anxiety.  No memory loss.         Objective:   Physical Exam GEN: A/Ox3; pleasant , NAD, well nourished   HEENT:  [place]/AT,  EACs-clear, TMs-wnl, NOSE-clear, THROAT-clear, no lesions, no postnasal drip or exudate noted.   NECK:  Supple w/ fair ROM; no JVD; normal carotid impulses w/o bruits; no thyromegaly or nodules palpated; no lymphadenopathy.  RESP  Clear  P & A; w/o, wheezes/ rales/ or rhonchi.no accessory muscle use, no dullness to percussion  CARD:  RRR, no m/r/g  ,  no peripheral edema, pulses intact, no cyanosis or clubbing.  GI:   Soft & nt; nml bowel sounds; no organomegaly or masses detected.  Musco: Warm bil, no deformities or joint swelling noted.   Neuro: alert, no focal deficits noted.    Skin: Warm, no lesions or rashes         Assessment & Plan:

## 2011-06-20 NOTE — Progress Notes (Signed)
Reviewed and agree with assessment/plan. 

## 2011-06-30 ENCOUNTER — Encounter: Payer: Self-pay | Admitting: Family Medicine

## 2011-06-30 ENCOUNTER — Ambulatory Visit (INDEPENDENT_AMBULATORY_CARE_PROVIDER_SITE_OTHER): Payer: Medicare FFS | Admitting: Family Medicine

## 2011-06-30 VITALS — BP 120/80 | Temp 97.6°F | Wt 164.0 lb

## 2011-06-30 DIAGNOSIS — R0789 Other chest pain: Secondary | ICD-10-CM

## 2011-06-30 DIAGNOSIS — R490 Dysphonia: Secondary | ICD-10-CM

## 2011-06-30 DIAGNOSIS — R071 Chest pain on breathing: Secondary | ICD-10-CM

## 2011-06-30 MED ORDER — HYDROCODONE-ACETAMINOPHEN 5-500 MG PO TABS: 1.0000 | ORAL_TABLET | Freq: Four times a day (QID) | ORAL | Status: AC | PRN

## 2011-06-30 NOTE — Progress Notes (Signed)
  Subjective:    Patient ID: Casey Harrell, female    DOB: 12/31/1934, 77 y.o.   MRN: [id number]  HPI  Seen for the following issues  2-3 days ago onset of hoarseness. Some postnasal drip symptoms. Increased fatigue. Minimal dry cough. Has not taken any antihistamines. No fever or chills. Some sore throat. Cough is actually improved c/w couple of months ago.  Patient also presents with right chest wall pain. No rash. Sharp pain 7/10 severity. Worse with movement and decreased at rest. No known injury. Some pain with deep breathing. No increased dyspnea. Advil helps. Denies nausea or vomiting. Also some relief with heat. No associated skin rash. Sore to touch.  Past Medical History  Diagnosis Date  . DIABETES-TYPE 2 07/15/2008  . DIABETIC PERIPHERAL NEUROPATHY 09/02/2009  . HYPERTENSION 07/15/2008  . ASTHMA 07/15/2008  . GERD 07/15/2008   Past Surgical History  Procedure Date  . Appendectomy 1971  . Abdominal hysterectomy 1971  . Tonsillectomy   . Tubal ligation   . Cesarean section   . Flexible sigmoidoscopy     diverticulitis  . Laparotomy     X 3 with adhesions lysis  . Foot surgery     right foot X 2    reports that she has never smoked. She has never used smokeless tobacco. She reports that she does not drink alcohol. Her drug history not on file. family history includes Arthritis in her mother; Asthma in her sister; Cancer in her paternal grandfather; Celiac disease in her sister; Diabetes in her mother; Heart disease in her father, mother, and sisters; and Stroke in her maternal grandfather. Allergies  Allergen Reactions  . Doxycycline Hyclate     REACTION: vomiting  . Iohexol      Code: HIVES, Desc: pt. states she breaks out in hives 06/15/08   . Morphine Sulfate     REACTION: hallucinations  . Moxifloxacin     REACTION: unknown      Review of Systems  Constitutional: Positive for fatigue. Negative for fever and chills.  HENT: Positive for sore throat, voice change  and postnasal drip. Negative for trouble swallowing.   Respiratory: Negative for cough, shortness of breath and wheezing.   Cardiovascular: Negative for chest pain, palpitations and leg swelling.  Gastrointestinal: Negative for abdominal pain.  Neurological: Negative for headaches.       Objective:   Physical Exam  Constitutional: She appears well-developed and well-nourished.  HENT:       Tongue slightly dry otherwise clear  Neck: Neck supple. No thyromegaly present.  Cardiovascular: Normal rate and regular rhythm.   Pulmonary/Chest: Effort normal and breath sounds normal. No respiratory distress. She has no wheezes. She has no rales.  Abdominal: Soft. Bowel sounds are normal. She exhibits no distension and no mass. There is no tenderness. There is no rebound and no guarding.  Musculoskeletal:       Chest wall reveals tenderness midaxillary line around the sixth rib region. No visible rash. No visible swelling.          Assessment & Plan:  #1 laryngitis. Probably viral. Try over-the-counter antihistamine such as chlorpheniramine for postnasal drip symptoms.  No indication for antibiotics at this time. #2 right chest wall pain. Doubt pleuritic as she has reproducible chest wall pain. No visible rash. Continue Advil. Limited Vicodin if not relieved with hydrocodone.  Consider X-rays if no better by next week.

## 2011-06-30 NOTE — Patient Instructions (Signed)
Try over the counter chlorpheniramine or brompheniramine for postnasal drip symptoms. May take up 2 Advil at one dose for pain.

## 2011-07-13 ENCOUNTER — Other Ambulatory Visit: Payer: Self-pay | Admitting: *Deleted

## 2011-07-13 MED ORDER — HYDROCODONE-HOMATROPINE 5-1.5 MG/5ML PO SYRP: 5.0000 mL | ORAL_SOLUTION | Freq: Four times a day (QID) | ORAL | Status: AC | PRN

## 2011-07-13 NOTE — Telephone Encounter (Signed)
Refill once 

## 2011-07-13 NOTE — Telephone Encounter (Signed)
Rx request for Hydrocodone cough syrup, last filled 05/31/11

## 2011-07-25 ENCOUNTER — Telehealth: Payer: Self-pay | Admitting: Family Medicine

## 2011-07-25 NOTE — Telephone Encounter (Signed)
She may have to get letter from her gastroenterologist to get approved.

## 2011-07-25 NOTE — Telephone Encounter (Signed)
Please advise 

## 2011-07-25 NOTE — Telephone Encounter (Signed)
Pt informed, she has appt tomorrow and will discuss further

## 2011-07-25 NOTE — Telephone Encounter (Signed)
Pt's prior auth for twice daily Nexium (40mg ) was denied.

## 2011-07-26 ENCOUNTER — Encounter: Payer: Self-pay | Admitting: Family Medicine

## 2011-07-26 ENCOUNTER — Ambulatory Visit (INDEPENDENT_AMBULATORY_CARE_PROVIDER_SITE_OTHER): Payer: Medicare FFS | Admitting: Family Medicine

## 2011-07-26 VITALS — BP 120/70 | Temp 97.8°F | Wt 162.0 lb

## 2011-07-26 DIAGNOSIS — R300 Dysuria: Secondary | ICD-10-CM

## 2011-07-26 DIAGNOSIS — J45901 Unspecified asthma with (acute) exacerbation: Secondary | ICD-10-CM

## 2011-07-26 LAB — POCT URINALYSIS DIPSTICK
Blood, UA: NEGATIVE
Clarity, UA: NEGATIVE
Glucose, UA: NEGATIVE
Spec Grav, UA: 1.01
Urobilinogen, UA: 0.2
pH, UA: 5.5

## 2011-07-26 MED ORDER — PANTOPRAZOLE SODIUM 40 MG PO TBEC: 40.0000 mg | DELAYED_RELEASE_TABLET | Freq: Two times a day (BID) | ORAL | Status: DC

## 2011-07-26 MED ORDER — PREDNISONE 10 MG PO TABS: ORAL_TABLET | ORAL | Status: DC

## 2011-07-26 MED ORDER — CEPHALEXIN 500 MG PO CAPS: 500.0000 mg | ORAL_CAPSULE | Freq: Three times a day (TID) | ORAL | Status: AC

## 2011-07-26 NOTE — Progress Notes (Signed)
  Subjective:    Patient ID: Casey Harrell, female    DOB: 03-03-1935, 77 y.o.   MRN: [id number]  HPI  Acute visit. Seen for the following issues.  She's had worsening cough for the past couple of weeks. Productive of yellow sputum. Increased wheezing. She has asthma and takes steroid inhaler regularly. She denies fever or chills. Dyspnea with activity. Using nebulizer at home twice daily with albuterol. Some nasal congestion.  New symptom of one day history of burning with urination. No frequency. Took Azo-Standard. Denies back pain. No nausea or vomiting. She has allergies reported to doxycycline and moxifloxacin  Past Medical History  Diagnosis Date  . DIABETES-TYPE 2 07/15/2008  . DIABETIC PERIPHERAL NEUROPATHY 09/02/2009  . HYPERTENSION 07/15/2008  . ASTHMA 07/15/2008  . GERD 07/15/2008   Past Surgical History  Procedure Date  . Appendectomy 1971  . Abdominal hysterectomy 1971  . Tonsillectomy   . Tubal ligation   . Cesarean section   . Flexible sigmoidoscopy     diverticulitis  . Laparotomy     X 3 with adhesions lysis  . Foot surgery     right foot X 2    reports that she has never smoked. She has never used smokeless tobacco. She reports that she does not drink alcohol. Her drug history not on file. family history includes Arthritis in her mother; Asthma in her sister; Cancer in her paternal grandfather; Celiac disease in her sister; Diabetes in her mother; Heart disease in her father, mother, and sisters; and Stroke in her maternal grandfather. Allergies  Allergen Reactions  . Doxycycline Hyclate     REACTION: vomiting  . Iohexol      Code: HIVES, Desc: pt. states she breaks out in hives 06/15/08   . Morphine Sulfate     REACTION: hallucinations  . Moxifloxacin     REACTION: unknown      Review of Systems  Constitutional: Negative for fever, chills and appetite change.  Respiratory: Positive for cough, shortness of breath and wheezing.   Cardiovascular: Negative  for chest pain, palpitations and leg swelling.  Gastrointestinal: Negative for nausea, vomiting, abdominal pain, diarrhea and constipation.  Genitourinary: Positive for dysuria. Negative for frequency and hematuria.  Musculoskeletal: Negative for back pain.  Neurological: Negative for dizziness.       Objective:   Physical Exam  Constitutional: She is oriented to person, place, and time. She appears well-developed and well-nourished.  HENT:  Mouth/Throat: Oropharynx is clear and moist.  Neck: Neck supple.  Cardiovascular: Normal rate and regular rhythm.   Pulmonary/Chest:       Patient has diffuse wheezes. Symmetric breath sounds. No rales. Respiratory rate 16  Musculoskeletal: She exhibits no edema.  Lymphadenopathy:    She has no cervical adenopathy.  Neurological: She is alert and oriented to person, place, and time.          Assessment & Plan:  #1 dysuria. Probable UTI. Urine culture sent. Cephalexin 500 mg 3 times a day #2 asthma exacerbation. Prednisone taper. Antibiotic as above. Followup promptly if symptoms worsen or persist

## 2011-07-29 LAB — URINE CULTURE: Colony Count: >=100000

## 2011-08-01 NOTE — Progress Notes (Signed)
Quick Note:  Pt informed on home VM ______ 

## 2011-08-03 ENCOUNTER — Other Ambulatory Visit: Payer: Self-pay | Admitting: *Deleted

## 2011-08-03 DIAGNOSIS — E785 Hyperlipidemia, unspecified: Secondary | ICD-10-CM

## 2011-08-03 MED ORDER — SIMVASTATIN 20 MG PO TABS: 20.0000 mg | ORAL_TABLET | Freq: Every day | ORAL | Status: DC

## 2011-08-16 ENCOUNTER — Other Ambulatory Visit: Payer: Self-pay | Admitting: Family Medicine

## 2011-08-17 ENCOUNTER — Encounter: Payer: Self-pay | Admitting: Pulmonary Disease

## 2011-08-17 ENCOUNTER — Ambulatory Visit (INDEPENDENT_AMBULATORY_CARE_PROVIDER_SITE_OTHER): Payer: Medicare FFS | Admitting: Pulmonary Disease

## 2011-08-17 VITALS — BP 130/68 | HR 97 | Temp 98.4°F | Ht 61.0 in | Wt 163.8 lb

## 2011-08-17 DIAGNOSIS — R05 Cough: Secondary | ICD-10-CM

## 2011-08-17 DIAGNOSIS — R059 Cough, unspecified: Secondary | ICD-10-CM

## 2011-08-17 DIAGNOSIS — R053 Chronic cough: Secondary | ICD-10-CM

## 2011-08-17 MED ORDER — BUDESONIDE 0.25 MG/2ML IN SUSP: 0.2500 mg | Freq: Two times a day (BID) | RESPIRATORY_TRACT | Status: DC

## 2011-08-17 MED ORDER — HYDROCODONE-HOMATROPINE 5-1.5 MG/5ML PO SYRP: 5.0000 mL | ORAL_SOLUTION | Freq: Four times a day (QID) | ORAL | Status: DC | PRN

## 2011-08-17 NOTE — Assessment & Plan Note (Addendum)
This is likely related to rhinitis with post-nasal drip, GERD, asthma, and vocal cord dysfunction.  She reports symptomatic benefit from inhaler therapy, and so will continue her current inhalers.  Will change these to pulmicort and prn albuterol via nebulizer to see if this is easier on her upper airway.  Will arrange for repeat pulmonary function test to further assess.  Her sinuses seem okay at present.  She is on therapy for her reflux.  I have refilled her script for her cough medicine.  If her symptoms persist, then she may need further evaluation of her vocal cords and may need referral to voice disorder center in [facility].

## 2011-08-17 NOTE — Progress Notes (Signed)
Chief Complaint  Patient presents with  . Follow-up    2 month followup. c/o cough worsening with white mucus, a sore chest due to coughing, wheezing, and sob.     History of Present Illness: Casey Harrell is a 77 y.o. female with chronic cough, rhinitis, asthma, gerd, and OSA intolerant of CPAP.  I last saw her in 2010.  She has been seen by Dr. Shelle Iron and Tammy more recently.  She was treated recent with prednisone from her PCP.  She still has a cough, and occasionally brings up clear sputum.  She denies hemoptysis.  Her sinuses are okay.  She feels she has trouble getting air into her lungs.  She feels dulera helped with the wheeze, but she still has a cough.  She continues using nexium, and this helps her reflux.  She does get occasional post-nasal drip, but denies globus sensation.  She is not seeing ENT anymore.  She uses her albuterol twice per day and this helps some.  She gets wheezing, but this comes from both her throat and chest.   Past Medical History  Diagnosis Date  . DIABETES-TYPE 2 07/15/2008  . DIABETIC PERIPHERAL NEUROPATHY 09/02/2009  . HYPERTENSION 07/15/2008  . ASTHMA 07/15/2008  . GERD 07/15/2008    Past Surgical History  Procedure Date  . Appendectomy 1971  . Abdominal hysterectomy 1971  . Tonsillectomy   . Tubal ligation   . Cesarean section   . Flexible sigmoidoscopy     diverticulitis  . Laparotomy     X 3 with adhesions lysis  . Foot surgery     right foot X 2    Allergies  Allergen Reactions  . Doxycycline Hyclate     REACTION: vomiting  . Iohexol      Code: HIVES, Desc: pt. states she breaks out in hives 06/15/08   . Morphine Sulfate     REACTION: hallucinations  . Moxifloxacin     REACTION: unknown    Physical Exam:  Blood pressure 130/68, pulse 97, temperature 98.4 F (36.9 C), temperature source Oral, height 5\' 1"  (1.549 m), weight 163 lb 12.8 oz (74.299 kg), SpO2 94.00%. Body mass index is 30.95 kg/(m^2). Wt Readings from Last 2  Encounters:  08/17/11 163 lb 12.8 oz (74.299 kg)  07/26/11 162 lb (73.483 kg)    General - Obese HEENT - no sinus tenderness, TM clear, no oral exudate, no LAN Cardiac - s1s2 regular, no murmur Chest - no wheeze/rales Abdomen - soft, nontender Extremities - no edema Skin - no rashes Neurologic - normal strength, CN intact Psychiatric - normal mood, behavior  Dg Chest 2 View  06/05/2011  *RADIOLOGY REPORT*  Clinical Data: Chronic cough.  History of diabetes.  Nonsmoker.  CHEST - 2 VIEW  Comparison: Chest x-ray 01/23/2011.  Findings: Lung volumes are normal.  No consolidative airspace disease.  No pleural effusions.  Pulmonary vasculature is normal. Diffuse bronchial wall thickening is again noted.  Heart size is mildly enlarged.  Atherosclerosis of the thoracic aorta.  Surgical clips in the lower right cervical region.  IMPRESSION:  1.  Diffuse bronchial wall thickening again noted.  This appears to be a chronic finding in this patient, and may relate to reactive airway disease, however, clinical correlation for signs of acute bronchitis may be warranted. 2.  Mild cardiomegaly unchanged. 3.  Atherosclerosis.  Original Report Authenticated By: Florencia Reasons, M.D.   Assessment/Plan:  Outpatient Encounter Prescriptions as of 08/17/2011  Medication Sig Dispense Refill  .  albuterol (ACCUNEB) 1.25 MG/3ML nebulizer solution Take 3 mLs (1.25 mg total) by nebulization every 6 (six) hours as needed for wheezing.  75 mL  12  . amitriptyline (ELAVIL) 25 MG tablet TAKE 1 TABLET AT BEDTIME  30 tablet  6  . aspirin 81 MG tablet Take 81 mg by mouth daily.        Marland Kitchen azelastine (ASTELIN) 137 MCG/SPRAY nasal spray Place 2 sprays into the nose 2 (two) times daily. Use in each nostril as directed  30 mL  5  . B-D ULTRAFINE III SHORT PEN 31G X 8 MM MISC USE AS DIRECTED  100 each  3  . Biotin 10 MG TABS Take by mouth daily.        . Calcium Citrate (CITRACAL PO) Take by mouth daily.        . carvedilol  (COREG) 25 MG tablet Take 25 mg by mouth 2 (two) times daily with meals.        . Cholecalciferol (VITAMIN D3) 2000 UNITS TABS Take by mouth daily.       Marland Kitchen CLEVER CHEK AUTO-CODE VOICE test strip       . diltiazem (CARDIZEM) 120 MG tablet Take 120 mg by mouth daily.        . DULoxetine (CYMBALTA) 60 MG capsule Take 1 capsule (60 mg total) by mouth daily.  30 capsule  11  . HYDROcodone-acetaminophen (VICODIN) 5-500 MG per tablet Take 1 tablet by mouth as needed.       . Lancets MISC       . levothyroxine (SYNTHROID, LEVOTHROID) 50 MCG tablet TAKE 1 TABLET BY MOUTH EVERY DAY  90 tablet  3  . Mometasone Furo-Formoterol Fum (DULERA) 200-5 MCG/ACT AERO Inhale 2 puffs into the lungs 2 (two) times daily.  1 Inhaler  11  . NEXIUM 40 MG capsule Take 80 mg by mouth daily.       . niacin (NIASPAN) 500 MG CR tablet Take 500 mg by mouth. Two tabs by mouth daily       . NOVOLOG FLEXPEN 100 UNIT/ML injection USE 1-9 UNITS EVERY 4 HOURS  3 Syringe  11  . olmesartan (BENICAR) 20 MG tablet Take 20 mg by mouth daily.        . pantoprazole (PROTONIX) 40 MG tablet Take 1 tablet (40 mg total) by mouth 2 (two) times daily.  60 tablet  11  . Probiotic Product (ALIGN) 4 MG CAPS Take by mouth daily.       . simvastatin (ZOCOR) 20 MG tablet Take 1 tablet (20 mg total) by mouth at bedtime.  30 tablet  11  . HYDROcodone-homatropine (HYCODAN) 5-1.5 MG/5ML syrup       . metFORMIN (GLUCOPHAGE) 1000 MG tablet Take 1,000 mg by mouth daily.       . predniSONE (DELTASONE) 10 MG tablet Taper as follows: 4-4-4-4-4-3-3-2-2-1  31 tablet  0    Deztiny Sarra Pager:  [phone] 08/17/2011, 3:53 PM

## 2011-08-17 NOTE — Patient Instructions (Signed)
Stop dulera Budesonide (pulmicort) one vial nebulized twice per day Albuterol one vial nebulized twice per day, and as needed in between for cough/wheeze/chest congestion Will schedule breathing test (PFT) Follow up in 2 months

## 2011-08-25 ENCOUNTER — Ambulatory Visit (INDEPENDENT_AMBULATORY_CARE_PROVIDER_SITE_OTHER): Payer: Medicare FFS | Admitting: Family Medicine

## 2011-08-25 ENCOUNTER — Encounter: Payer: Self-pay | Admitting: Family Medicine

## 2011-08-25 VITALS — BP 130/80 | HR 90 | Temp 97.8°F | Resp 14

## 2011-08-25 DIAGNOSIS — R053 Chronic cough: Secondary | ICD-10-CM

## 2011-08-25 DIAGNOSIS — R300 Dysuria: Secondary | ICD-10-CM

## 2011-08-25 DIAGNOSIS — R05 Cough: Secondary | ICD-10-CM

## 2011-08-25 DIAGNOSIS — R059 Cough, unspecified: Secondary | ICD-10-CM

## 2011-08-25 DIAGNOSIS — R0989 Other specified symptoms and signs involving the circulatory and respiratory systems: Secondary | ICD-10-CM

## 2011-08-25 DIAGNOSIS — R0609 Other forms of dyspnea: Secondary | ICD-10-CM

## 2011-08-25 LAB — POCT URINALYSIS DIPSTICK
Blood, UA: NEGATIVE
Glucose, UA: NEGATIVE
Leukocytes, UA: NEGATIVE
Nitrite, UA: NEGATIVE
Spec Grav, UA: 1.01
Urobilinogen, UA: 0.2
pH, UA: 7

## 2011-08-25 MED ORDER — PREDNISONE 10 MG PO TABS: ORAL_TABLET | ORAL | Status: DC

## 2011-08-25 NOTE — Progress Notes (Signed)
  Subjective:    Patient ID: Casey Harrell, female    DOB: [birth year], 77 y.o.   MRN: [id number]  HPI  Patient presents with continued cough. She has seen pulmonologist multiple times with recent change to Pulmicort nebulizer and also albuterol nebulizer. She is using her nebulizers regularly. Her cough is mostly nonproductive. She's had increased wheezing past couple days. No fever. No significant productive cough. Using cough syrup at night. She takes medication for GERD and is suspected to have some postnasal drip type symptoms.  Patient was drinking some water in our lobby and states that water "went down the wrong way". Had increased coughing since then. No respiratory distress.  Recurrent dysuria. Was seen last month had Escherichia coli UTI which resolved with Keflex. She's had 3 days now of burning with urination and frequency. Again no fever or chills. No nausea or vomiting. No back pain.  Past Medical History  Diagnosis Date  . DIABETES-TYPE 2 07/15/2008  . DIABETIC PERIPHERAL NEUROPATHY 09/02/2009  . HYPERTENSION 07/15/2008  . ASTHMA 07/15/2008  . GERD 07/15/2008   Past Surgical History  Procedure Date  . Appendectomy 1971  . Abdominal hysterectomy 1971  . Tonsillectomy   . Tubal ligation   . Cesarean section   . Flexible sigmoidoscopy     diverticulitis  . Laparotomy     X 3 with adhesions lysis  . Foot surgery     right foot X 2    reports that she has never smoked. She has never used smokeless tobacco. She reports that she does not drink alcohol. Her drug history not on file. family history includes Arthritis in her mother; Asthma in her sister; Cancer in her paternal grandfather; Celiac disease in her sister; Diabetes in her mother; Heart disease in her father, mother, and sisters; and Stroke in her maternal grandfather. Allergies  Allergen Reactions  . Doxycycline Hyclate     REACTION: vomiting  . Iohexol      Code: HIVES, Desc: pt. states she breaks out in hives  06/15/08   . Morphine Sulfate     REACTION: hallucinations  . Moxifloxacin     REACTION: unknown      Review of Systems  HENT: Negative for trouble swallowing and voice change.   Respiratory: Positive for choking, shortness of breath and wheezing.   Cardiovascular: Negative for chest pain, palpitations and leg swelling.  Gastrointestinal: Negative for abdominal pain.  Genitourinary: Positive for dysuria. Negative for hematuria.  Neurological: Negative for syncope.       Objective:   Physical Exam  Constitutional: She appears well-developed and well-nourished.  HENT:  Right Ear: External ear normal.  Left Ear: External ear normal.  Mouth/Throat: Oropharynx is clear and moist.  Neck: Neck supple. No thyromegaly present.  Cardiovascular: Normal rate and regular rhythm.   Pulmonary/Chest:       Patient has diffuse wheezes. No rales  Lymphadenopathy:    She has no cervical adenopathy.          Assessment & Plan:  #1 chronic cough. Significant wheezing on exam today. Office nebulizer given with some improvement in cough.  Prednisone taper and continue usual nebs.  Cont f/u with pulmonary.  #2 Recurrent dysuria. Repeat urinalysis. Culture if indicated.

## 2011-09-12 ENCOUNTER — Other Ambulatory Visit: Payer: Self-pay | Admitting: Family Medicine

## 2011-09-13 ENCOUNTER — Ambulatory Visit (INDEPENDENT_AMBULATORY_CARE_PROVIDER_SITE_OTHER): Payer: Medicare FFS | Admitting: Family Medicine

## 2011-09-13 ENCOUNTER — Encounter: Payer: Self-pay | Admitting: Family Medicine

## 2011-09-13 VITALS — BP 130/70 | Temp 98.1°F | Wt 163.0 lb

## 2011-09-13 DIAGNOSIS — E039 Hypothyroidism, unspecified: Secondary | ICD-10-CM

## 2011-09-13 DIAGNOSIS — R05 Cough: Secondary | ICD-10-CM

## 2011-09-13 DIAGNOSIS — E119 Type 2 diabetes mellitus without complications: Secondary | ICD-10-CM

## 2011-09-13 DIAGNOSIS — R252 Cramp and spasm: Secondary | ICD-10-CM

## 2011-09-13 DIAGNOSIS — I10 Essential (primary) hypertension: Secondary | ICD-10-CM

## 2011-09-13 DIAGNOSIS — E785 Hyperlipidemia, unspecified: Secondary | ICD-10-CM

## 2011-09-13 DIAGNOSIS — IMO0001 Reserved for inherently not codable concepts without codable children: Secondary | ICD-10-CM

## 2011-09-13 DIAGNOSIS — R059 Cough, unspecified: Secondary | ICD-10-CM

## 2011-09-13 DIAGNOSIS — R053 Chronic cough: Secondary | ICD-10-CM

## 2011-09-13 NOTE — Progress Notes (Signed)
  Subjective:    Patient ID: Casey Harrell, female    DOB: May 15, 1934, 77 y.o.   MRN: [id number]  HPI  Patient seen for medical followup. She has history of type 2 diabetes, hypertension, dyslipidemia, GERD, hypothyroidism, and chronic cough with reactive airway component. Her cough is finally improved and she thinks budesonide is helping. She has follow up with pulmonologist soon.  She has type 2 diabetes and fasting blood sugars recently been quite variable between 80 and 200. She is taking Lantus 110 units daily and NovoLog 5-9 units 3 times a day. No hypoglycemia. No symptoms of hyperglycemia. Last A1c 7.9%. Recent vision check normal. No retinopathy. She has some peripheral neuropathy with pain fairly well controlled. She remains on Elavil and Cymbalta.  Past Medical History  Diagnosis Date  . DIABETES-TYPE 2 07/15/2008  . DIABETIC PERIPHERAL NEUROPATHY 09/02/2009  . HYPERTENSION 07/15/2008  . ASTHMA 07/15/2008  . GERD 07/15/2008   Past Surgical History  Procedure Date  . Appendectomy 1971  . Abdominal hysterectomy 1971  . Tonsillectomy   . Tubal ligation   . Cesarean section   . Flexible sigmoidoscopy     diverticulitis  . Laparotomy     X 3 with adhesions lysis  . Foot surgery     right foot X 2    reports that she has never smoked. She has never used smokeless tobacco. She reports that she does not drink alcohol. Her drug history not on file. family history includes Arthritis in her mother; Asthma in her sister; Cancer in her paternal grandfather; Celiac disease in her sister; Diabetes in her mother; Heart disease in her father, mother, and sisters; and Stroke in her maternal grandfather. Allergies  Allergen Reactions  . Doxycycline Hyclate     REACTION: vomiting  . Iohexol      Code: HIVES, Desc: pt. states she breaks out in hives 06/15/08   . Morphine Sulfate     REACTION: hallucinations  . Moxifloxacin     REACTION: unknown     Review of Systems  Constitutional:  Negative for fever, chills, appetite change and unexpected weight change.  Eyes: Negative for visual disturbance.  Respiratory: Negative for cough, shortness of breath and wheezing.   Cardiovascular: Negative for chest pain, palpitations and leg swelling.  Genitourinary: Negative for dysuria.  Neurological: Negative for dizziness.       Objective:   Physical Exam  Constitutional: She appears well-developed and well-nourished.  HENT:  Mouth/Throat: Oropharynx is clear and moist.  Cardiovascular: Normal rate and regular rhythm.   Pulmonary/Chest: Effort normal and breath sounds normal. No respiratory distress. She has no wheezes. She has no rales.  Musculoskeletal: She exhibits no edema.       Feet reveal no lesions. She is impairment to sensory touch with microfilament. Good distal foot pulses  Psychiatric: She has a normal mood and affect. Her behavior is normal.          Assessment & Plan:  #1 type 2 diabetes. Recheck A1c. Discussed diet and exercise #2 hypertension stable #3 hypothyroidism. TSH at goal when last checked several months ago. #4 chronic cough which is finally improved and she thinks is improving with budesonide by nebulizer #5 bilateral leg cramps. Check electrolytes and magnesium. Increase fluid intake. May be secondary to deconditioning

## 2011-09-14 LAB — BASIC METABOLIC PANEL
BUN: 22 mg/dL (ref 6–23)
CO2: 25 mEq/L (ref 19–32)
Calcium: 9.3 mg/dL (ref 8.4–10.5)
Chloride: 100 mEq/L (ref 96–112)
Creatinine, Ser: 1 mg/dL (ref 0.4–1.2)
GFR: 61.06 mL/min (ref >60.00)
Glucose, Bld: 134 mg/dL — ABNORMAL HIGH (ref 70–99)
Potassium: 4.5 mEq/L (ref 3.5–5.1)
Sodium: 137 mEq/L (ref 135–145)

## 2011-09-14 LAB — MAGNESIUM: Magnesium: 1.2 mg/dL — ABNORMAL LOW (ref 1.5–2.5)

## 2011-09-14 LAB — HEMOGLOBIN A1C: Hgb A1c MFr Bld: 7.7 % — ABNORMAL HIGH (ref 4.6–6.5)

## 2011-09-15 ENCOUNTER — Ambulatory Visit (INDEPENDENT_AMBULATORY_CARE_PROVIDER_SITE_OTHER): Payer: Medicare FFS | Admitting: Pulmonary Disease

## 2011-09-15 DIAGNOSIS — R059 Cough, unspecified: Secondary | ICD-10-CM

## 2011-09-15 DIAGNOSIS — R05 Cough: Secondary | ICD-10-CM

## 2011-09-15 DIAGNOSIS — R053 Chronic cough: Secondary | ICD-10-CM

## 2011-09-15 LAB — PULMONARY FUNCTION TEST

## 2011-09-15 NOTE — Progress Notes (Signed)
PFT done today. 

## 2011-09-17 ENCOUNTER — Encounter: Payer: Self-pay | Admitting: Pulmonary Disease

## 2011-09-18 MED ORDER — MAGNESIUM OXIDE 400 (241.3 MG) MG PO TABS: 400.0000 mg | ORAL_TABLET | Freq: Every day | ORAL | Status: DC

## 2011-09-18 NOTE — Progress Notes (Signed)
Addended by: Melchor Amour on: 09/18/2011 04:39 PM   Modules accepted: Orders

## 2011-10-24 ENCOUNTER — Ambulatory Visit: Payer: Medicare FFS | Admitting: Pulmonary Disease

## 2011-11-22 ENCOUNTER — Telehealth: Payer: Self-pay | Admitting: Pulmonary Disease

## 2011-11-22 MED ORDER — HYDROCODONE-HOMATROPINE 5-1.5 MG/5ML PO SYRP: 5.0000 mL | ORAL_SOLUTION | Freq: Four times a day (QID) | ORAL | Status: DC | PRN

## 2011-11-22 NOTE — Telephone Encounter (Signed)
[facility] requesting  Hydrocodone -homatropine syrup  Take 1 tsp by mouth every 6 hours as needed for cough . #300 ml  Allergies  Allergen Reactions  . Doxycycline Hyclate     REACTION: vomiting  . Iohexol      Code: HIVES, Desc: pt. states she breaks out in hives 06/15/08   . Morphine Sulfate     REACTION: hallucinations  . Moxifloxacin     REACTION: unknown   Dr Craige Cotta is this ok to fill .   Thank you

## 2011-11-22 NOTE — Addendum Note (Signed)
Addended by: Tommie Sams on: 11/22/2011 05:20 PM   Modules accepted: Orders

## 2011-11-22 NOTE — Telephone Encounter (Signed)
rx has been called into the pharmacy

## 2011-11-22 NOTE — Telephone Encounter (Signed)
Okay to refill hycodan 5-1.5 mg/ml, 5 ml q6h prn cough.  Dispense 120 ml with one refill.

## 2011-11-29 ENCOUNTER — Encounter: Payer: Self-pay | Admitting: Pulmonary Disease

## 2011-11-29 ENCOUNTER — Ambulatory Visit (INDEPENDENT_AMBULATORY_CARE_PROVIDER_SITE_OTHER): Payer: Medicare FFS | Admitting: Pulmonary Disease

## 2011-11-29 VITALS — BP 126/68 | HR 101 | Temp 98.1°F | Ht 61.0 in | Wt 162.0 lb

## 2011-11-29 DIAGNOSIS — R059 Cough, unspecified: Secondary | ICD-10-CM

## 2011-11-29 DIAGNOSIS — J45909 Unspecified asthma, uncomplicated: Secondary | ICD-10-CM

## 2011-11-29 DIAGNOSIS — J4530 Mild persistent asthma, uncomplicated: Secondary | ICD-10-CM | POA: Insufficient documentation

## 2011-11-29 DIAGNOSIS — R05 Cough: Secondary | ICD-10-CM

## 2011-11-29 DIAGNOSIS — R053 Chronic cough: Secondary | ICD-10-CM

## 2011-11-29 NOTE — Progress Notes (Signed)
Chief Complaint  Patient presents with  . Follow-up    Cough is better from last visit. slight wheezing occasional. denies any SOB, chest tx. Pt is wanting to know if she still needs to use the budesonide    History of Present Illness: Casey Harrell is a 76 y.o. female with chronic cough, rhinitis, asthma, gerd, and OSA intolerant of CPAP.  Her cough is much better.  She still has a dry cough with occasional wheeze from her upper chest.  She uses albuterol once per day because she thought she was supposed to.  She will use albuterol when she wheezes, and this helps.  She is not having trouble with her sinuses, and her reflux is okay.    Past Medical History  Diagnosis Date  . DIABETES-TYPE 2 07/15/2008  . DIABETIC PERIPHERAL NEUROPATHY 09/02/2009  . HYPERTENSION 07/15/2008  . ASTHMA 07/15/2008  . GERD 07/15/2008    Past Surgical History  Procedure Date  . Appendectomy 1971  . Abdominal hysterectomy 1971  . Tonsillectomy   . Tubal ligation   . Cesarean section   . Flexible sigmoidoscopy     diverticulitis  . Laparotomy     X 3 with adhesions lysis  . Foot surgery     right foot X 2    Allergies  Allergen Reactions  . Doxycycline Hyclate     REACTION: vomiting  . Iohexol      Code: HIVES, Desc: pt. states she breaks out in hives 06/15/08   . Morphine Sulfate     REACTION: hallucinations  . Moxifloxacin     REACTION: unknown    Physical Exam:  Blood pressure 126/68, pulse 101, temperature 98.1 F (36.7 C), temperature source Oral, height 5\' 1"  (1.549 m), weight 162 lb (73.483 kg), SpO2 94.00%.  Body mass index is 30.61 kg/(m^2). Wt Readings from Last 2 Encounters:  11/29/11 162 lb (73.483 kg)  09/13/11 163 lb (73.936 kg)    General - Obese HEENT - no sinus tenderness, TM clear, no oral exudate, no LAN Cardiac - s1s2 regular, no murmur Chest - no wheeze/rales Abdomen - soft, nontender Extremities - no edema Skin - no rashes Neurologic - normal strength, CN  intact Psychiatric - normal mood, behavior   Assessment/Plan:  Outpatient Encounter Prescriptions as of 11/29/2011  Medication Sig Dispense Refill  . albuterol (ACCUNEB) 1.25 MG/3ML nebulizer solution Take 3 mLs (1.25 mg total) by nebulization every 6 (six) hours as needed for wheezing.  75 mL  12  . amitriptyline (ELAVIL) 25 MG tablet TAKE 1 TABLET AT BEDTIME  30 tablet  6  . aspirin 81 MG tablet Take 81 mg by mouth daily.        . B-D ULTRAFINE III SHORT PEN 31G X 8 MM MISC USE AS DIRECTED  100 each  3  . budesonide (PULMICORT) 0.25 MG/2ML nebulizer solution Take 2 mLs (0.25 mg total) by nebulization 2 (two) times daily.  60 mL  12  . Calcium Citrate (CITRACAL PO) Take by mouth 2 (two) times daily.       . carvedilol (COREG) 25 MG tablet Take 25 mg by mouth 2 (two) times daily with meals.        . Cholecalciferol (VITAMIN D3) 2000 UNITS TABS Take by mouth daily.       Marland Kitchen CLEVER CHEK AUTO-CODE VOICE test strip       . diltiazem (CARDIZEM) 120 MG tablet Take 120 mg by mouth daily.        Marland Kitchen  HYDROcodone-acetaminophen (VICODIN) 5-500 MG per tablet Take 1 tablet by mouth as needed.       Marland Kitchen HYDROcodone-homatropine (HYCODAN) 5-1.5 MG/5ML syrup Take 5 mLs by mouth every 6 (six) hours as needed for cough.  120 mL  1  . insulin glargine (LANTUS SOLOSTAR) 100 UNIT/ML injection Inject 100 Units into the skin at bedtime.      . Lancets MISC       . levothyroxine (SYNTHROID, LEVOTHROID) 50 MCG tablet TAKE 1 TABLET BY MOUTH EVERY DAY  90 tablet  3  . magnesium oxide (MAG-OX 400) 400 (241.3 MG) MG tablet Take 1 tablet (400 mg total) by mouth daily.  30 tablet  3  . metFORMIN (GLUCOPHAGE) 1000 MG tablet Take 1,000 mg by mouth daily.       . niacin (NIASPAN) 500 MG CR tablet Take 500 mg by mouth. Two tabs by mouth daily       . NOVOLOG FLEXPEN 100 UNIT/ML injection USE 1-9 UNITS EVERY 4 HOURS  3 Syringe  11  . olmesartan (BENICAR) 20 MG tablet Take 20 mg by mouth daily.        . pantoprazole (PROTONIX) 40  MG tablet Take 1 tablet (40 mg total) by mouth 2 (two) times daily.  60 tablet  11  . simvastatin (ZOCOR) 20 MG tablet Take 1 tablet (20 mg total) by mouth at bedtime.  30 tablet  11  . DULoxetine (CYMBALTA) 60 MG capsule Take 1 capsule (60 mg total) by mouth daily.  30 capsule  11  . DISCONTD: azelastine (ASTELIN) 137 MCG/SPRAY nasal spray Place 2 sprays into the nose 2 (two) times daily. Use in each nostril as directed  30 mL  5  . DISCONTD: Biotin 10 MG TABS Take by mouth daily.        Marland Kitchen DISCONTD: NEXIUM 40 MG capsule Take 80 mg by mouth daily.       Marland Kitchen DISCONTD: predniSONE (DELTASONE) 10 MG tablet Taper as follows: 6-4-4-4-3-3-2-2-1-1  30 tablet  0  . DISCONTD: Probiotic Product (ALIGN) 4 MG CAPS Take by mouth daily.         Daiton Cowles Pager:  [phone] 11/29/2011, 3:54 PM

## 2011-11-29 NOTE — Assessment & Plan Note (Signed)
She can try using budesonide once daily, and if stable after two weeks she can stop budesonide.  Advised she does not need use albuterol on scheduled basis, but should use it as needed.

## 2011-11-29 NOTE — Patient Instructions (Signed)
Can try using budesonide one vial nebulized once per day for two weeks>>if okay, then stop budesonide Albuterol one vial nebulized as needed for cough, wheeze, or chest congestion Call if breathing or cough gets worse Follow up in 6 months

## 2011-11-29 NOTE — Assessment & Plan Note (Signed)
This is likely related to rhinitis with post-nasal drip, GERD, asthma, and vocal cord dysfunction.  Improved.

## 2012-01-04 ENCOUNTER — Other Ambulatory Visit: Payer: Self-pay | Admitting: Family Medicine

## 2012-01-18 ENCOUNTER — Encounter: Payer: Self-pay | Admitting: Family Medicine

## 2012-01-18 ENCOUNTER — Ambulatory Visit (INDEPENDENT_AMBULATORY_CARE_PROVIDER_SITE_OTHER): Payer: Medicare FFS | Admitting: Family Medicine

## 2012-01-18 VITALS — BP 122/78 | Temp 98.1°F | Wt 161.0 lb

## 2012-01-18 DIAGNOSIS — R790 Abnormal level of blood mineral: Secondary | ICD-10-CM

## 2012-01-18 DIAGNOSIS — E1149 Type 2 diabetes mellitus with other diabetic neurological complication: Secondary | ICD-10-CM

## 2012-01-18 DIAGNOSIS — E119 Type 2 diabetes mellitus without complications: Secondary | ICD-10-CM

## 2012-01-18 DIAGNOSIS — E1142 Type 2 diabetes mellitus with diabetic polyneuropathy: Secondary | ICD-10-CM

## 2012-01-18 MED ORDER — PREGABALIN 150 MG PO CAPS: 150.0000 mg | ORAL_CAPSULE | Freq: Every day | ORAL | Status: DC

## 2012-01-18 NOTE — Patient Instructions (Addendum)
Magnesium Rich Diet Magnesium is a needed mineral found in the body. It is important for strong bones and a healthy heart. Magnesium also plays a role in muscle and nerve function. Certain medical conditions, such as poorly controlled diabetes and gastrointestinal disease, may lead to a lack of magnesium in the body (magnesium deficiency). SOURCES OF MAGNESIUM  Green vegetables such as spinach are good sources of magnesium.   Some beans and peas (legumes), nuts and seeds, and whole, unrefined grains are also good sources of magnesium. Refined grains are generally low in magnesium. When white flour is refined and processed, the magnesium-rich germ and bran are removed.   Bread made from whole-grain wheat flour provides more magnesium than bread made from white refined flour.   Tap water can be a source of magnesium, but the amount varies according to the water supply. Water that naturally contains more minerals is described as "hard." Hard water contains more magnesium than "soft" water.  Eating a wide variety of legumes, nuts, whole grains, and green vegetables will help you meet your daily dietary need for magnesium. Selected food sources of magnesium are listed below. FOOD SOURCES Food / Magnesium (mg)  Almonds, dry roasted, 1 oz / 80 mg   Cashews, dry roasted, 1 oz / 75 mg   Soybeans, mature, cooked,  cup / 75 mg   Spinach, frozen, cooked,  cup / 75 mg   Nuts, mixed, dry roasted, 1 oz / 65 mg   Cereal, shredded wheat, 1 bowl / 55 mg   Oatmeal, instant, prepared with water, 1 cup / 55 mg   Potato, baked with skin, 1 medium / 50 mg   Peanuts, dry roasted, 1 oz / 50 mg   Peanut butter, smooth, 2 tbs / 50 mg   Wheat bran, crude, 2 tbs / 45 mg   Black-eyed peas, cooked,  cup / 45 mg   Yogurt, plain, skim milk, 8 oz / 45 mg   Bran flakes,  cup / 40 mg   Vegetarian baked beans,  cup / 40 mg   Brown rice, long-grained, cooked,  cup / 40 mg   Lentils, mature seeds,  cooked,  cup / 35 mg   Avocado,  cup pureed / 35 mg   Kidney beans, canned,  cup / 35 mg   Pinto beans, cooked,  cup / 35 mg   Wheat germ, crude, 2 tbs / 35 mg   Chocolate milk, 1 cup / 33 mg   Banana, 1 medium / 30 mg   Milk chocolate candy bar, 1  oz / 28 mg   Milk, reduced fat (2%) or fat-free, 1 cup / 28 mg   Bread, whole-wheat, 1 slice / 25 mg   Raisins, seedless,  cup / 25 mg   Whole milk, 1 cup / 24 mg   Chocolate pudding, 4 oz / 24 mg  RECOMMENDED DIETARY ALLOWANCES FOR MAGNESIUM The following list shows the Recommended Dietary Allowances for magnesium in milligrams per day for children and adults. Age: 84 to 75 years  Female: 80 mg   Female: 80 mg  Age: 24 to 75 years  Female: 130 mg   Female: 130 mg  Age: 36 to 75 years  Female: 240 mg   Female: 240 mg  Age: 74 to 75 years  Female: 410 mg   Female: 360 mg   Pregnant: 400 mg   Breastfeeding: 360 mg  Age: 74 to 75 years  Female: 400 mg  Female: 310 mg   Pregnant: 350 mg   Breastfeeding: 310 mg  Age: 26 years or older  Female: 420 mg   Female: 320 mg   Pregnant: 360 mg   Breastfeeding: 320 mg  RECOMMENDED ADEQUATE INTAKE FOR MAGNESIUM FOR INFANTS There is not enough information on magnesium to establish Recommended Dietary Allowances for infants. The following list shows the average intake of magnesium in healthy, breastfed infants in milligrams per day. Age:  0 to 99 months  Female: 30 mg   Female: 30 mg  Age: 22 to 26 months  Female: 75 mg   Female: 75 mg  HEALTH RISKS OF TOO MUCH MAGNESIUM Dietary magnesium does not pose a health risk. However, large doses of magnesium in supplements can cause adverse effects such as diarrhea and abdominal cramping. Risk of magnesium toxicity increases with kidney failure, when the kidney loses the ability to remove excess magnesium. Very large doses of magnesium-containing laxatives and antacids have also been associated with magnesium toxicity. Signs of  excess magnesium can be similar to magnesium deficiency and include changes in mental status, nausea, diarrhea, appetite loss, muscle weakness, difficulty breathing, extremely low blood pressure, and irregular heartbeat.  TOLERABLE UPPER INTAKE LEVELS FOR SUPPLEMENTAL MAGNESIUM The following list shows the tolerable limits (upper intake levels) for magnesium from supplements in healthy infants, children, and adults in milligrams per day. A caregiver may prescribe magnesium in higher doses for specific medical problems. There is no upper intake level for magnesium from food sources.  Age: Infants   Female: Undetermined   Female: Undetermined  Age: 67 to 75 years  Female: 110 mg   Female: 130 mg  Age: 68 to 76 years  Female: 240 mg   Female: 240 mg  Age: 88 years or older  Female: 350 mg   Female: 350 mg   Pregnant: 350 mg   Breastfeeding: 350 mg  Document Released: 12/19/2004 Document Revised: 04/13/2011 Document Reviewed: 07/23/2008 [facility] Patient Information 2012 [employer]  STOP Niacin

## 2012-01-18 NOTE — Progress Notes (Signed)
Subjective:    Patient ID: Casey Harrell, female    DOB: Sep 06, 1934, 77 y.o.   MRN: [id number]  HPI  Patient has multiple chronic medical problems. Seen today for followup. Diabetes fairly well controlled but fasting blood sugars are up and down. Last A1c 7.7%. Sometimes has fasting blood sugars below 100 and sometimes as high as 160. No recent hypoglycemia. Lantus 110 units daily and she usually takes NovoLog 9 units with lunch and 14 units with supper. She eats very little breakfast. Progressive neuropathy pains feet and legs. Interfering with sleep at night. She takes Cymbalta and Elavil. Previously on Lyrica but she felt this made her sedated. When she stopped Lyrica one half years ago her pain increase. Frequently 8-9/10 pain. Previously took Neurontin but had increased sedation worse than later.  Frequent hot flashes at night. She takes low-dose Niaspan. Previous cholesterol very well controlled. History of frequent leg cramps which have resolved after replacement of magnesium following low blood level of 1.2. No renal insufficiency.  Patient's had some weight gain issues. Poor dietary compliance at times. She's had nutritional education previously but not for several years. She is willing to consider referral to nutritionist  Lung disease stable on current inhaler regimen. No recent cough or wheeze. No flu vaccine yet.  Past Medical History  Diagnosis Date  . DIABETES-TYPE 2 07/15/2008  . DIABETIC PERIPHERAL NEUROPATHY 09/02/2009  . HYPERTENSION 07/15/2008  . ASTHMA 07/15/2008  . GERD 07/15/2008   Past Surgical History  Procedure Date  . Appendectomy 1971  . Abdominal hysterectomy 1971  . Tonsillectomy   . Tubal ligation   . Cesarean section   . Flexible sigmoidoscopy     diverticulitis  . Laparotomy     X 3 with adhesions lysis  . Foot surgery     right foot X 2    reports that she has never smoked. She has never used smokeless tobacco. She reports that she does not drink  alcohol. Her drug history not on file. family history includes Arthritis in her mother; Asthma in her sister; Cancer in her paternal grandfather; Celiac disease in her sister; Diabetes in her mother; Heart disease in her father, mother, and sisters; and Stroke in her maternal grandfather. Allergies  Allergen Reactions  . Doxycycline Hyclate     REACTION: vomiting  . Iohexol      Code: HIVES, Desc: pt. states she breaks out in hives 06/15/08   . Morphine Sulfate     REACTION: hallucinations  . Moxifloxacin     REACTION: unknown      Review of Systems  Constitutional: Negative for fever, chills, appetite change and fatigue.  HENT: Negative for sinus pressure.   Eyes: Negative for visual disturbance.  Respiratory: Negative for cough and shortness of breath.   Gastrointestinal: Negative for abdominal pain.  Neurological: Negative for dizziness.       Objective:   Physical Exam  Constitutional: She appears well-developed and well-nourished.  HENT:  Mouth/Throat: Oropharynx is clear and moist.  Neck: Neck supple. No thyromegaly present.  Cardiovascular: Normal rate and regular rhythm.   Pulmonary/Chest: Effort normal and breath sounds normal. No respiratory distress. She has no wheezes. She has no rales.  Musculoskeletal: She exhibits no edema.       Feet reveal no skin lesions. Good distal foot pulses. Good capillary refill. No calluses. Mild impairment sensation with monofilament testing   Lymphadenopathy:    She has no cervical adenopathy.  Skin: No rash noted.  Assessment & Plan:  #1 type 2 diabetes. Recheck A1c. Continue yearly eye exam. Dietary factors discussed.  Set up nutrition referral. Flu vaccine given #2 recent low magnesium. Following replacement her leg cramps have ceased. Recheck magnesium level day. Magnesium rich diet given. If back up to normal stop oral supplement and focus on diet #3 diabetic peripheral neuropathy pain. Poorly controlled. Add  back Lyrica 150 mg each bedtime

## 2012-01-19 ENCOUNTER — Other Ambulatory Visit: Payer: Self-pay | Admitting: *Deleted

## 2012-01-19 DIAGNOSIS — Z23 Encounter for immunization: Secondary | ICD-10-CM

## 2012-01-19 LAB — MAGNESIUM: Magnesium: 1.4 mg/dL — ABNORMAL LOW (ref 1.5–2.5)

## 2012-01-19 LAB — HEMOGLOBIN A1C: Hgb A1c MFr Bld: 7.7 % — ABNORMAL HIGH (ref 4.6–6.5)

## 2012-01-19 MED ORDER — METFORMIN HCL 1000 MG PO TABS: 1000.0000 mg | ORAL_TABLET | Freq: Two times a day (BID) | ORAL | Status: DC

## 2012-01-22 NOTE — Progress Notes (Signed)
Quick Note:  Pt informed on home VM ______ 

## 2012-02-13 ENCOUNTER — Ambulatory Visit: Payer: Medicare FFS | Admitting: *Deleted

## 2012-02-26 ENCOUNTER — Telehealth: Payer: Self-pay | Admitting: Family Medicine

## 2012-02-26 NOTE — Telephone Encounter (Signed)
Pt states she has had severe pain in her hip for 3 weeks and wants a cortisone injection.  Would like to know if Dr. Caryl Never does cortisone injections.  She has had cortisone shots before given to her by Conley Simmonds (nurse practitioner at [facility]) a few years ago.

## 2012-02-26 NOTE — Telephone Encounter (Signed)
Pt, notified and appt scheduled.

## 2012-02-26 NOTE — Telephone Encounter (Signed)
Would have to evaluate first. I give cortisone injections for greater trochanteric bursitis

## 2012-02-29 ENCOUNTER — Encounter: Payer: Self-pay | Admitting: Family Medicine

## 2012-02-29 ENCOUNTER — Ambulatory Visit (INDEPENDENT_AMBULATORY_CARE_PROVIDER_SITE_OTHER): Payer: Medicare FFS | Admitting: Family Medicine

## 2012-02-29 VITALS — BP 128/72 | Temp 98.7°F

## 2012-02-29 DIAGNOSIS — Z23 Encounter for immunization: Secondary | ICD-10-CM

## 2012-02-29 DIAGNOSIS — M5416 Radiculopathy, lumbar region: Secondary | ICD-10-CM

## 2012-02-29 DIAGNOSIS — IMO0002 Reserved for concepts with insufficient information to code with codable children: Secondary | ICD-10-CM

## 2012-02-29 DIAGNOSIS — Z299 Encounter for prophylactic measures, unspecified: Secondary | ICD-10-CM

## 2012-02-29 MED ORDER — PREDNISONE 10 MG PO TABS: ORAL_TABLET | ORAL | Status: DC

## 2012-02-29 MED ORDER — ZOSTER VACCINE LIVE 19400 UNT/0.65ML ~~LOC~~ SOLR: 0.6500 mL | Freq: Once | SUBCUTANEOUS | Status: DC

## 2012-02-29 NOTE — Progress Notes (Signed)
  Subjective:    Patient ID: Casey Harrell, female    DOB: [birth year], 77 y.o.   MRN: [id number]  HPI  Patient seen with right lower extremity pain past 2 weeks. No injury. She went to chiropractor apparently had plain x-rays of back and we have no records. Her pain started lower lumbar area but mostly buttock and radiates down to the foot lateral aspect. Quality is ache sometimes 8/10 severity. Worse with standing. No numbness. No definite weakness. She tried icing and bio freeze with minimal relief. She has history of chronic neuropathy and takes amitriptyline, Cymbalta, and Lyrica. Denies prior history of lumbar disc issues. No urine incontinence.  Type 2 diabetes treated with insulin and currently stable.  Past Medical History  Diagnosis Date  . DIABETES-TYPE 2 07/15/2008  . DIABETIC PERIPHERAL NEUROPATHY 09/02/2009  . HYPERTENSION 07/15/2008  . ASTHMA 07/15/2008  . GERD 07/15/2008   Past Surgical History  Procedure Date  . Appendectomy 1971  . Abdominal hysterectomy 1971  . Tonsillectomy   . Tubal ligation   . Cesarean section   . Flexible sigmoidoscopy     diverticulitis  . Laparotomy     X 3 with adhesions lysis  . Foot surgery     right foot X 2    reports that she has never smoked. She has never used smokeless tobacco. She reports that she does not drink alcohol. Her drug history not on file. family history includes Arthritis in her mother; Asthma in her sister; Cancer in her paternal grandfather; Celiac disease in her sister; Diabetes in her mother; Heart disease in her father, mother, and sisters; and Stroke in her maternal grandfather. Allergies  Allergen Reactions  . Doxycycline Hyclate     REACTION: vomiting  . Iohexol      Code: HIVES, Desc: pt. states she breaks out in hives 06/15/08   . Morphine Sulfate     REACTION: hallucinations  . Moxifloxacin     REACTION: unknown      Review of Systems  Constitutional: Negative for fever and chills.  Cardiovascular:  Negative for chest pain.  Gastrointestinal: Negative for abdominal pain.  Genitourinary: Negative for dysuria and hematuria.  Neurological: Negative for weakness and numbness.       Objective:   Physical Exam  Constitutional: She appears well-developed and well-nourished.  Cardiovascular: Normal rate and regular rhythm.   Pulmonary/Chest: Effort normal and breath sounds normal. No respiratory distress. She has no wheezes. She has no rales.  Musculoskeletal: She exhibits no edema.       Straight leg raise is negative. She has good range of motion right hip with internal and external rotation.  Neurological:       Symmetric reflexes knee and ankle bilaterally. She has slight pain with right dorsiflexion otherwise nonfocal neuro exam          Assessment & Plan:  #1Right lumbar radiculopathy symptoms without focal neurologic deficits. Would not start prednisone yet as shingles vaccine just given today. May need MRI scan to further assess. Touch base in one to 2 weeks if no improvement  #2 health maintenance. Shingles vaccine given. No contraindications.

## 2012-02-29 NOTE — Patient Instructions (Addendum)
Follow up immediately for any worsening back/leg pain, numbness, or increased weakness.

## 2012-03-01 ENCOUNTER — Ambulatory Visit: Payer: Medicare FFS | Admitting: Family Medicine

## 2012-03-04 ENCOUNTER — Ambulatory Visit: Payer: Medicare FFS | Admitting: *Deleted

## 2012-03-19 ENCOUNTER — Telehealth: Payer: Self-pay | Admitting: Family Medicine

## 2012-03-19 NOTE — Telephone Encounter (Signed)
FYI

## 2012-03-19 NOTE — Telephone Encounter (Signed)
FYI..the patient is doing great!  She has finished all her prednisone, and all is well!!

## 2012-03-28 ENCOUNTER — Encounter: Payer: Self-pay | Admitting: *Deleted

## 2012-03-28 ENCOUNTER — Encounter: Payer: Medicare FFS | Attending: Family Medicine | Admitting: *Deleted

## 2012-03-28 VITALS — Ht 61.0 in | Wt 165.9 lb

## 2012-03-28 DIAGNOSIS — E1149 Type 2 diabetes mellitus with other diabetic neurological complication: Secondary | ICD-10-CM

## 2012-03-28 DIAGNOSIS — Z713 Dietary counseling and surveillance: Secondary | ICD-10-CM | POA: Insufficient documentation

## 2012-03-28 NOTE — Progress Notes (Signed)
  Medical Nutrition Therapy:  Appt start time: 1115 end time:  1215.  Assessment:  Primary concerns today: patient here for diabetes. Lives with her husband who still works. Has trouble sleeping at night so sleeps until 9-11 AM. Stays at home except for grocery shopping etc. [place] when she tries to walk outside her hips and knees hurt so much she then has to use a cane around the house afterwards. She SMBG every AM with average fasting BG range of 76-150 mg/dl.   MEDICATIONS: see list   DIETARY INTAKE:  Usual eating pattern includes 3 meals and 0-1 snacks per day.  Everyday foods include good variety of all food groups.  Avoided foods include none stated.    24-hr recall:  B ( AM): egg and cheerios with skim milk  Snk ( AM): none  L ( PM): at home: tomato soup with whole wheat crackers x 12, water or unsweet decaf tea Snk ( PM): none OR fruit cocktail D ( PM): self prepares; meat, starch, veg type meal, occasionally salad too, unsweet tea Snk ( PM): none Beverages: water, unsweet tea  Usual physical activity: none right now, has problem with hip and leg pain  Estimated energy needs: 1400 calories 158 g carbohydrates 105 g protein 34 g fat  Progress Towards Goal(s):  In progress.   Nutritional Diagnosis:  NB-1.1 Food and nutrition-related knowledge deficit As related to diabetes management.  As evidenced by A1c of 7.7%    Intervention:  Nutrition counseling and diabetes education initiated. Discussed basic physiology of diabetes, SMBG and rationale of checking BG at alternate times of day, A1c, Carb Counting and reading food labels. Plan to review her carb counting ability, address benefits of increased activity and suggestions of types safe for her to do, and insulin action at next visit.  Plan: Aim for 3 Carb choices (45 grams) per meal +/- 1 either way Aim for 0-1 Carb per snack if hungry Consider reading food labels for total carbohydrate of foods  instead of the sugar content Continue checking Blood sugar daily but consider checking after supper occasionally too  Handouts given during visit include: Living Well with Diabetes Carb Counting and Food Label handouts Meal Plan Card  Monitoring/Evaluation:  Dietary intake, exercise, reading food labels, and body weight in 4 week(s).

## 2012-03-28 NOTE — Patient Instructions (Addendum)
Plan: Aim for 3 Carb choices (45 grams) per meal +/- 1 either way Aim for 0-1 Carb per snack if hungry Consider reading food labels for total carbohydrate of foods instead of the sugar content Continue checking Blood sugar daily but consider checking after supper occasionally too

## 2012-04-03 ENCOUNTER — Other Ambulatory Visit: Payer: Self-pay | Admitting: Family Medicine

## 2012-04-05 NOTE — Telephone Encounter (Signed)
OK to refill both meds for 3 months.

## 2012-04-19 ENCOUNTER — Encounter: Payer: Self-pay | Admitting: Family Medicine

## 2012-04-19 ENCOUNTER — Ambulatory Visit (INDEPENDENT_AMBULATORY_CARE_PROVIDER_SITE_OTHER): Payer: Medicare FFS | Admitting: Family Medicine

## 2012-04-19 VITALS — BP 150/98 | HR 80 | Temp 98.2°F | Wt 163.0 lb

## 2012-04-19 DIAGNOSIS — E119 Type 2 diabetes mellitus without complications: Secondary | ICD-10-CM

## 2012-04-19 DIAGNOSIS — E785 Hyperlipidemia, unspecified: Secondary | ICD-10-CM

## 2012-04-19 DIAGNOSIS — I10 Essential (primary) hypertension: Secondary | ICD-10-CM

## 2012-04-19 DIAGNOSIS — E039 Hypothyroidism, unspecified: Secondary | ICD-10-CM

## 2012-04-19 LAB — LIPID PANEL
Cholesterol: 101 mg/dL (ref 0–200)
HDL: 23 mg/dL — ABNORMAL LOW (ref >39)
LDL Cholesterol: 31 mg/dL (ref 0–99)
Total CHOL/HDL Ratio: 4.4 Ratio
Triglycerides: 233 mg/dL — ABNORMAL HIGH (ref <150)
VLDL: 47 mg/dL — ABNORMAL HIGH (ref 0–40)

## 2012-04-19 LAB — BASIC METABOLIC PANEL
BUN: 24 mg/dL — ABNORMAL HIGH (ref 6–23)
CO2: 29 mEq/L (ref 19–32)
Calcium: 9.7 mg/dL (ref 8.4–10.5)
Chloride: 98 mEq/L (ref 96–112)
Creat: 0.84 mg/dL (ref 0.50–1.10)
Glucose, Bld: 302 mg/dL — ABNORMAL HIGH (ref 70–99)
Potassium: 4.8 mEq/L (ref 3.5–5.3)
Sodium: 135 mEq/L (ref 135–145)

## 2012-04-19 LAB — HEPATIC FUNCTION PANEL
ALT: 29 U/L (ref 0–35)
AST: 28 U/L (ref 0–37)
Albumin: 4.2 g/dL (ref 3.5–5.2)
Alkaline Phosphatase: 98 U/L (ref 39–117)
Bilirubin, Direct: 0.1 mg/dL (ref 0.0–0.3)
Indirect Bilirubin: 0.2 mg/dL (ref 0.0–0.9)
Total Bilirubin: 0.3 mg/dL (ref 0.3–1.2)
Total Protein: 6.9 g/dL (ref 6.0–8.3)

## 2012-04-19 LAB — TSH: TSH: 2.753 u[IU]/mL (ref 0.350–4.500)

## 2012-04-19 NOTE — Progress Notes (Signed)
  Subjective:    Patient ID: Casey Harrell, female    DOB: 1935-01-09, 77 y.o.   MRN: [id number]  HPI  Medical followup:  Type 2 diabetes. Recent A1c 7.7%. Saw nutritionist. Still has poor dietary compliance at times. No symptoms of hyperglycemia. No recent hypoglycemia. Remains on Lantus 110 units daily and NovoLog twice a day at breakfast and with dinner.  Hypertension. Has been generally well-controlled. Ran out of coreg and needs to get refilled and did not take dose this morning. May explain elevation today. No recent orthostasis. No chest pains.  Hypothyroidism. Takes levothyroxin. Compliant with therapy. Needs repeat TSH. She has some cold intolerance. This is chronic.  Hyperlipidemia treated with simvastatin. No myalgias.  Her back pain is significantly improved since last visit. She was treated with prednisone taper.  Past Medical History  Diagnosis Date  . DIABETES-TYPE 2 07/15/2008  . DIABETIC PERIPHERAL NEUROPATHY 09/02/2009  . HYPERTENSION 07/15/2008  . ASTHMA 07/15/2008  . GERD 07/15/2008   Past Surgical History  Procedure Date  . Appendectomy 1971  . Abdominal hysterectomy 1971  . Tonsillectomy   . Tubal ligation   . Cesarean section   . Flexible sigmoidoscopy     diverticulitis  . Laparotomy     X 3 with adhesions lysis  . Foot surgery     right foot X 2    reports that she has never smoked. She has never used smokeless tobacco. She reports that she does not drink alcohol. Her drug history not on file. family history includes Arthritis in her mother; Asthma in her sister; Cancer in her paternal grandfather; Celiac disease in her sister; Diabetes in her mother; Heart disease in her father, mother, and sisters; and Stroke in her maternal grandfather. Allergies  Allergen Reactions  . Doxycycline Hyclate     REACTION: vomiting  . Iohexol      Code: HIVES, Desc: pt. states she breaks out in hives 06/15/08   . Morphine Sulfate     REACTION: hallucinations  .  Moxifloxacin     REACTION: unknown      Review of Systems  Constitutional: Negative for fever, chills, appetite change and unexpected weight change.  HENT: Negative for trouble swallowing.   Eyes: Negative for visual disturbance.  Respiratory: Negative for cough and wheezing.   Cardiovascular: Negative for chest pain, palpitations and leg swelling.  Gastrointestinal: Negative for abdominal pain.  Neurological: Negative for dizziness and headaches.       Objective:   Physical Exam  Constitutional: She is oriented to person, place, and time. She appears well-developed and well-nourished.  HENT:  Mouth/Throat: Oropharynx is clear and moist.  Neck: Neck supple. No thyromegaly present.  Cardiovascular: Normal rate and regular rhythm.   Pulmonary/Chest: Effort normal and breath sounds normal. No respiratory distress. She has no wheezes. She has no rales.  Musculoskeletal: She exhibits no edema.  Lymphadenopathy:    She has no cervical adenopathy.  Neurological: She is alert and oriented to person, place, and time.          Assessment & Plan:  #1 type 2 diabetes. History of marginal control. History of poor dietary compliance. Recheck A1c. Continue nutritional counseling #2 hypertension. Elevation today but patient did not take carvedilol this morning. Get back on regular dosing of carvedilol in monitor closely  #3 hyperlipidemia. Check lipid and hepatic panel #4 hypothyroidism. Recheck TSH

## 2012-04-20 LAB — HEMOGLOBIN A1C
Hgb A1c MFr Bld: 8 % — ABNORMAL HIGH (ref <5.7)
Mean Plasma Glucose: 183 mg/dL — ABNORMAL HIGH (ref <117)

## 2012-04-24 NOTE — Progress Notes (Signed)
Quick Note:  Pt informed ______ 

## 2012-04-26 ENCOUNTER — Other Ambulatory Visit: Payer: Self-pay | Admitting: Family Medicine

## 2012-04-26 ENCOUNTER — Ambulatory Visit: Payer: Medicare FFS | Admitting: *Deleted

## 2012-05-10 ENCOUNTER — Other Ambulatory Visit: Payer: Self-pay | Admitting: Family Medicine

## 2012-05-10 ENCOUNTER — Other Ambulatory Visit: Payer: Self-pay | Admitting: *Deleted

## 2012-05-10 MED ORDER — ALBUTEROL SULFATE 1.25 MG/3ML IN NEBU: 1.0000 | INHALATION_SOLUTION | Freq: Four times a day (QID) | RESPIRATORY_TRACT | Status: DC | PRN

## 2012-05-10 MED ORDER — BUDESONIDE 0.25 MG/2ML IN SUSP: 0.2500 mg | Freq: Two times a day (BID) | RESPIRATORY_TRACT | Status: DC

## 2012-06-04 ENCOUNTER — Ambulatory Visit: Payer: Medicare FFS | Admitting: Pulmonary Disease

## 2012-06-14 ENCOUNTER — Other Ambulatory Visit: Payer: Self-pay | Admitting: *Deleted

## 2012-06-14 MED ORDER — INSULIN PEN NEEDLE 31G X 8 MM MISC: Status: DC

## 2012-06-26 ENCOUNTER — Encounter: Payer: Self-pay | Admitting: Pulmonary Disease

## 2012-06-26 ENCOUNTER — Ambulatory Visit (INDEPENDENT_AMBULATORY_CARE_PROVIDER_SITE_OTHER): Payer: Medicare Other | Admitting: Pulmonary Disease

## 2012-06-26 VITALS — BP 130/82 | HR 102 | Temp 98.4°F | Ht 61.5 in | Wt 165.2 lb

## 2012-06-26 DIAGNOSIS — R059 Cough, unspecified: Secondary | ICD-10-CM

## 2012-06-26 DIAGNOSIS — J45909 Unspecified asthma, uncomplicated: Secondary | ICD-10-CM

## 2012-06-26 DIAGNOSIS — R053 Chronic cough: Secondary | ICD-10-CM

## 2012-06-26 DIAGNOSIS — J4530 Mild persistent asthma, uncomplicated: Secondary | ICD-10-CM

## 2012-06-26 MED ORDER — MOMETASONE FUROATE 50 MCG/ACT NA SUSP: 2.0000 | Freq: Every day | NASAL | Status: DC

## 2012-06-26 NOTE — Progress Notes (Signed)
Chief Complaint  Patient presents with  . Follow-up    Cough was doing fine until this morning. Dry hacking cough. early in the mornings she gets up a lot of green phlem. She c/o lots of wheezing.    History of Present Illness: Casey Harrell is a 78 y.o. female with chronic cough, rhinitis, asthma, gerd, and OSA intolerant of CPAP.  She was doing well until yesterday.  She has more sinus congestion, post-nasal drip, upper airway wheeze, and cough.  She is bringing up yellow to green sputum.  She denies chest congestion, fever, chest tightness, or hemoptysis.  She has been getting popping in her left ear.  She is using her pulmicort bid, and albuterol 2 to 3 times per week.  She has used neti pot and nasonex before; these have helped, but she is not using them now. Cough one day, better before, no exposure, green sputum, sinus post nasal, chest okay, no wheeze in chest  TESTS: Spirometry 11/25/08 >> FEV1 1.48(86%), FVC 1.85(75%), FEV1% 80  IgE 01/29/09 >> 86.5 PFT 09/15/11 >> FEV1 1.89 (99%), FEV1% 88, TLC 4.25 (89%), DLCO 70%, no BD  Past Medical History  Diagnosis Date  . DIABETES-TYPE 2 07/15/2008  . DIABETIC PERIPHERAL NEUROPATHY 09/02/2009  . HYPERTENSION 07/15/2008  . ASTHMA 07/15/2008  . GERD 07/15/2008    Past Surgical History  Procedure Laterality Date  . Appendectomy  1971  . Abdominal hysterectomy  1971  . Tonsillectomy    . Tubal ligation    . Cesarean section    . Flexible sigmoidoscopy      diverticulitis  . Laparotomy      X 3 with adhesions lysis  . Foot surgery      right foot X 2    Outpatient Encounter Prescriptions as of 06/26/2012  Medication Sig Dispense Refill  . albuterol (ACCUNEB) 1.25 MG/3ML nebulizer solution Take 3 mLs (1.25 mg total) by nebulization every 6 (six) hours as needed for wheezing.  3 mL  12  . amitriptyline (ELAVIL) 25 MG tablet TAKE 1 TABLET AT BEDTIME  30 tablet  2  . aspirin 81 MG tablet Take 81 mg by mouth daily.        . budesonide  (PULMICORT) 0.25 MG/2ML nebulizer solution Take 2 mLs (0.25 mg total) by nebulization 2 (two) times daily. DX:  493.90  120 mL  5  . Calcium Citrate (CITRACAL PO) Take by mouth 2 (two) times daily.       . carvedilol (COREG) 25 MG tablet Take 25 mg by mouth 2 (two) times daily with meals.        . Cholecalciferol (VITAMIN D3) 2000 UNITS TABS Take by mouth daily.       Marland Kitchen CLEVER CHEK AUTO-CODE VOICE test strip       . CYMBALTA 60 MG capsule TAKE ONE CAPSULE BY MOUTH EVERY DAY  30 capsule  11  . dextromethorphan 15 MG/5ML syrup Take 5 mLs by mouth as needed for cough.      . diltiazem (CARDIZEM CD) 120 MG 24 hr capsule       . insulin glargine (LANTUS SOLOSTAR) 100 UNIT/ML injection Inject 100 Units into the skin at bedtime.      . Insulin Pen Needle (B-D ULTRAFINE III SHORT PEN) 31G X 8 MM MISC Use daily as instructed  100 each  3  . Lancets MISC       . LANTUS SOLOSTAR 100 UNIT/ML injection USE 90 UNITS EVERY DAY  180  Syringe  11  . levothyroxine (SYNTHROID, LEVOTHROID) 50 MCG tablet TAKE 1 TABLET BY MOUTH EVERY DAY  90 tablet  3  . magnesium oxide (MAG-OX) 400 (241.3 MG) MG tablet TAKE 1 TABLET BY MOUTH DAILY.  30 tablet  2  . metFORMIN (GLUCOPHAGE) 1000 MG tablet Take 1 tablet (1,000 mg total) by mouth 2 (two) times daily with a meal.  60 tablet  11  . NOVOLOG FLEXPEN 100 UNIT/ML injection USE 1 TO 9 UNITS EVERY 4 HOURS  15 Syringe  6  . olmesartan (BENICAR) 20 MG tablet Take 20 mg by mouth daily.        . pantoprazole (PROTONIX) 40 MG tablet Take 1 tablet (40 mg total) by mouth 2 (two) times daily.  60 tablet  11  . pregabalin (LYRICA) 150 MG capsule Take 1 capsule (150 mg total) by mouth daily.  30 capsule  5  . simvastatin (ZOCOR) 20 MG tablet Take 1 tablet (20 mg total) by mouth at bedtime.  30 tablet  11   No facility-administered encounter medications on file as of 06/26/2012.    Allergies  Allergen Reactions  . Doxycycline Hyclate     REACTION: vomiting  . Iohexol      Code:  HIVES, Desc: pt. states she breaks out in hives 06/15/08   . Morphine Sulfate     REACTION: hallucinations  . Moxifloxacin     REACTION: unknown    Physical Exam:  Filed Vitals:   06/26/12 1558 06/26/12 1600  BP:  130/82  Pulse:  102  Temp: 98.4 F (36.9 C)   TempSrc: Oral   Height: 5' 1.5" (1.562 m)   Weight: 165 lb 3.2 oz (74.934 kg)   SpO2:  97%     Current Encounter SPO2  06/26/12 1600 97%  11/29/11 1536 94%  08/25/11 1502 93%     Body mass index is 30.71 kg/(m^2).   Wt Readings from Last 2 Encounters:  06/26/12 165 lb 3.2 oz (74.934 kg)  04/19/12 163 lb (73.936 kg)     General - No distress ENT - No sinus tenderness, clear nasal discharge, no oral exudate, no LAN, TM clear Cardiac - s1s2 regular, no murmur Chest - No wheeze/rales/dullness Back - No focal tenderness Abd - Soft, non-tender Ext - No edema Neuro - Normal strength Skin - No rashes Psych - normal mood, and behavior   Assessment/Plan:  Casey Helling, MD [facility] Pager:  [phone] 06/26/2012, 4:08 PM

## 2012-06-26 NOTE — Assessment & Plan Note (Signed)
Stable on current regimen of pulmicort and prn albuterol.

## 2012-06-26 NOTE — Assessment & Plan Note (Signed)
She has worsening cough related to sinus congestion and post-nasal drip.  Will have her use neti pot and nasonex.

## 2012-06-26 NOTE — Patient Instructions (Addendum)
Use neti pot daily for 2 weeks, then as needed Use nasonex two sprays in each nostril daily for 2 weeks, then as needed Call if not feeling better Follow up in 6 months

## 2012-07-08 ENCOUNTER — Telehealth: Payer: Self-pay | Admitting: Family Medicine

## 2012-07-08 MED ORDER — ONDANSETRON HCL 8 MG PO TABS: 8.0000 mg | ORAL_TABLET | Freq: Three times a day (TID) | ORAL | Status: DC | PRN

## 2012-07-08 NOTE — Telephone Encounter (Signed)
Pt states MD told her she has Vertigo on a previous appt.  When pt came in for appt, she was over the vertigo.  But today it is back.  Pt unable to drive, and no one to bring her.  Pt would like [facility] [place], [place]

## 2012-07-08 NOTE — Telephone Encounter (Signed)
Yes, pt would like something to help with the nausea, she has not vomited, however she has not had anything to eat either

## 2012-07-08 NOTE — Telephone Encounter (Signed)
Most vertigo is NOT relieved with medication.  If she is having significant nausea or vomiting let me know and we can call in something for that.

## 2012-07-08 NOTE — Telephone Encounter (Signed)
zofran 8 mg po q 8 hours prn nausea/vomiting disp #12 with no refill

## 2012-07-08 NOTE — Telephone Encounter (Signed)
Pt informed

## 2012-07-18 ENCOUNTER — Other Ambulatory Visit: Payer: Self-pay | Admitting: Family Medicine

## 2012-07-19 NOTE — Telephone Encounter (Signed)
Refill for 6 months. 

## 2012-07-19 NOTE — Telephone Encounter (Signed)
Lyrica last filled 01-18-12, #30 with 5 refills

## 2012-07-22 ENCOUNTER — Ambulatory Visit (INDEPENDENT_AMBULATORY_CARE_PROVIDER_SITE_OTHER): Payer: Medicare Other | Admitting: Family Medicine

## 2012-07-22 ENCOUNTER — Encounter: Payer: Self-pay | Admitting: Family Medicine

## 2012-07-22 VITALS — BP 102/70 | Temp 98.0°F | Wt 164.0 lb

## 2012-07-22 DIAGNOSIS — E119 Type 2 diabetes mellitus without complications: Secondary | ICD-10-CM

## 2012-07-22 DIAGNOSIS — L259 Unspecified contact dermatitis, unspecified cause: Secondary | ICD-10-CM

## 2012-07-22 NOTE — Progress Notes (Signed)
  Subjective:    Patient ID: Casey Harrell, female    DOB: [birth year], 78 y.o.   MRN: [id number]  HPI Acute visit  Patient seen with pruritic rash mostly involving neck region and upper anterior chest. Noted about one week ago. Denies any jewelry or any clear contact allergies She tried Neosporin, Cortisporin, and goldbond without improvement No alleviating factors. No exacerbating factors  Type 2 diabetes currently on insulin. Sugars well controlled. Recently had change of insurance and medications are very costly She will try to get information from insurance coming regarding possible cost alternatives  Past Medical History  Diagnosis Date  . DIABETES-TYPE 2 07/15/2008  . DIABETIC PERIPHERAL NEUROPATHY 09/02/2009  . HYPERTENSION 07/15/2008  . ASTHMA 07/15/2008  . GERD 07/15/2008   Past Surgical History  Procedure Laterality Date  . Appendectomy  1971  . Abdominal hysterectomy  1971  . Tonsillectomy    . Tubal ligation    . Cesarean section    . Flexible sigmoidoscopy      diverticulitis  . Laparotomy      X 3 with adhesions lysis  . Foot surgery      right foot X 2    reports that she has never smoked. She has never used smokeless tobacco. She reports that she does not drink alcohol. Her drug history is not on file. family history includes Arthritis in her mother; Asthma in her sister; Cancer in her paternal grandfather; Celiac disease in her sister; Diabetes in her mother; Heart disease in her father, mother, and sisters; and Stroke in her maternal grandfather. Allergies  Allergen Reactions  . Doxycycline Hyclate     REACTION: vomiting  . Iohexol      Code: HIVES, Desc: pt. states she breaks out in hives 06/15/08   . Morphine Sulfate     REACTION: hallucinations  . Moxifloxacin     REACTION: unknown      Review of Systems  Constitutional: Negative for fever and chills.  Respiratory: Negative for cough and shortness of breath.   Skin: Positive for rash.        Objective:   Physical Exam  Constitutional: She appears well-developed and well-nourished.  Neck: Neck supple.  Cardiovascular: Normal rate and regular rhythm.   Pulmonary/Chest: Effort normal and breath sounds normal. No respiratory distress. She has no wheezes. She has no rales.  Lymphadenopathy:    She has no cervical adenopathy.  Skin:  Patient is macular minimally raised erythematous blanching nontender rash upper anterior chest and lower neck region. No vesicles          Assessment & Plan:  #1 contact dermatitis neck and upper anterior chest. Depo-Medrol 80 mg IM. Avoid Neosporin or other topicals at this time-unless she decides to use over-the-counter hydrocortisone #2 type 2 diabetes. Patient will check with insurance regarding his alternatives (ie less expensive alternatives).

## 2012-07-22 NOTE — Patient Instructions (Addendum)

## 2012-07-31 ENCOUNTER — Other Ambulatory Visit: Payer: Self-pay | Admitting: Family Medicine

## 2012-08-02 ENCOUNTER — Other Ambulatory Visit: Payer: Self-pay | Admitting: Family Medicine

## 2012-08-05 ENCOUNTER — Telehealth: Payer: Self-pay | Admitting: Family Medicine

## 2012-08-05 NOTE — Telephone Encounter (Signed)
Patient Information:  Caller Name: Dayelin  Phone: [phone]  Patient: Casey Harrell, Casey Harrell  Gender: Female  DOB: Jun 09, 1934  Age: 78 Years  PCP: Evelena Peat (Family Practice)  Office Follow Up:  Does the office need to follow up with this patient?: No  Instructions For The Office: N/A  RN Note:  States onset of generalized numbness during church 08/04/12; states when she returned home from church, her BP was 198/98, and she took an extra BP pill/benicar at that time.  Currently BP 188/83 and notes some mild numbness in hands and arms 08/05/12.  Denies headache, shortness of breath, or chest pain.  Per hypertension protocol, emergent symptoms denied; advised appt within 2 weeks.  Appt scheduled 08/06/12 0945 with Dr. Caryl Never.  krs/can  Symptoms  Reason For Call & Symptoms: hypertension  Reviewed Health History In EMR: Yes  Reviewed Medications In EMR: Yes  Reviewed Allergies In EMR: Yes  Reviewed Surgeries / Procedures: Yes  Date of Onset of Symptoms: 08/04/2012  Guideline(s) Used:  High Blood Pressure  Disposition Per Guideline:   See Within 2 Weeks in Office  Reason For Disposition Reached:   BP > 140/90 and is taking BP medications  Advice Given:  N/A  Patient Will Follow Care Advice:  YES  Appointment Scheduled:  08/06/2012 09:45:00 Appointment Scheduled Provider:  Evelena Peat (Family Practice)

## 2012-08-06 ENCOUNTER — Encounter: Payer: Self-pay | Admitting: Family Medicine

## 2012-08-06 ENCOUNTER — Ambulatory Visit (INDEPENDENT_AMBULATORY_CARE_PROVIDER_SITE_OTHER): Payer: Medicare Other | Admitting: Family Medicine

## 2012-08-06 VITALS — BP 104/58 | Temp 98.2°F | Wt 155.0 lb

## 2012-08-06 DIAGNOSIS — I10 Essential (primary) hypertension: Secondary | ICD-10-CM

## 2012-08-06 NOTE — Patient Instructions (Addendum)

## 2012-08-06 NOTE — Progress Notes (Signed)
  Subjective:    Patient ID: Casey Harrell, female    DOB: 1934-06-11, 78 y.o.   MRN: [id number]  HPI  Patient seen for concern for elevated blood pressure. Has generally been very well controlled on Sunday she felt "numb all over". This occurred while she was at church. She went home and blood pressure 198/97 Since and blood pressure then back down. She takes carvedilol, Benicar, and Cardizem and compliant with all. No recent chest pains. No headaches. No focal weakness. No confusion.  Past Medical History  Diagnosis Date  . DIABETES-TYPE 2 07/15/2008  . DIABETIC PERIPHERAL NEUROPATHY 09/02/2009  . HYPERTENSION 07/15/2008  . ASTHMA 07/15/2008  . GERD 07/15/2008   Past Surgical History  Procedure Laterality Date  . Appendectomy  1971  . Abdominal hysterectomy  1971  . Tonsillectomy    . Tubal ligation    . Cesarean section    . Flexible sigmoidoscopy      diverticulitis  . Laparotomy      X 3 with adhesions lysis  . Foot surgery      right foot X 2    reports that she has never smoked. She has never used smokeless tobacco. She reports that she does not drink alcohol. Her drug history is not on file. family history includes Arthritis in her mother; Asthma in her sister; Cancer in her paternal grandfather; Celiac disease in her sister; Diabetes in her mother; Heart disease in her father, mother, and sisters; and Stroke in her maternal grandfather. Allergies  Allergen Reactions  . Doxycycline Hyclate     REACTION: vomiting  . Iohexol      Code: HIVES, Desc: pt. states she breaks out in hives 06/15/08   . Morphine Sulfate     REACTION: hallucinations  . Moxifloxacin     REACTION: unknown     Review of Systems  Constitutional: Positive for fatigue.  Eyes: Negative for visual disturbance.  Respiratory: Negative for cough, chest tightness, shortness of breath and wheezing.   Cardiovascular: Negative for chest pain, palpitations and leg swelling.  Neurological: Negative for  dizziness, seizures, syncope, weakness, light-headedness and headaches.       Objective:   Physical Exam  Constitutional: She appears well-developed and well-nourished.  Cardiovascular: Normal rate and regular rhythm.   Pulmonary/Chest: Effort normal and breath sounds normal. No respiratory distress. She has no wheezes. She has no rales.  Musculoskeletal: She exhibits no edema.          Assessment & Plan:  Hypertension. Recent isolated elevation but very well-controlled today with reading105/60. Recommend close observation. She is encouraged to bring in her blood pressure cuff at followup to compare with ours.  We'll plan recheck A1c at followup

## 2012-08-14 ENCOUNTER — Other Ambulatory Visit: Payer: Self-pay | Admitting: Family Medicine

## 2012-09-16 ENCOUNTER — Other Ambulatory Visit: Payer: Self-pay | Admitting: Family Medicine

## 2012-09-23 ENCOUNTER — Telehealth: Payer: Self-pay | Admitting: *Deleted

## 2012-09-23 NOTE — Telephone Encounter (Signed)
Novolog flexpen, use 1-9 units every 4 hours no longer covered by pt insurance.  [facility] asking if insulin can be changed?

## 2012-09-23 NOTE — Telephone Encounter (Signed)
Let me know which insulin they do cover.  Eg. Do they cover Humalog?

## 2012-10-17 ENCOUNTER — Ambulatory Visit (INDEPENDENT_AMBULATORY_CARE_PROVIDER_SITE_OTHER): Payer: Medicare Other | Admitting: Family Medicine

## 2012-10-17 ENCOUNTER — Encounter: Payer: Self-pay | Admitting: Family Medicine

## 2012-10-17 VITALS — BP 132/72 | Temp 98.5°F | Wt 155.0 lb

## 2012-10-17 DIAGNOSIS — I10 Essential (primary) hypertension: Secondary | ICD-10-CM

## 2012-10-17 DIAGNOSIS — E119 Type 2 diabetes mellitus without complications: Secondary | ICD-10-CM

## 2012-10-17 DIAGNOSIS — E785 Hyperlipidemia, unspecified: Secondary | ICD-10-CM

## 2012-10-17 LAB — HEMOGLOBIN A1C: Hgb A1c MFr Bld: 7.2 % — ABNORMAL HIGH (ref 4.6–6.5)

## 2012-10-17 NOTE — Progress Notes (Signed)
Subjective:    Patient ID: Casey Harrell, female    DOB: 04-21-1935, 78 y.o.   MRN: [id number]  HPI Patient seen for medical followup Type 2 diabetes. History fair control. Last A1c 8.0% back in December. Currently takes Lantus 100 units daily and NovoLog 9 units twice daily with meals Her current insulin regimen, however, is costing almost $1000 per month She's not sure if Levemir is an alternative to Lantus for coverage Apparently, she has better coverage for Humalog and Humulin products (vs Novolog) Last hypoglycemic symptoms were over 2 months ago. Fasting blood sugars recently generally well-controlled. No symptoms of hyperglycemia. Chronic neuropathy pains which are stable  She still has breakthrough neuropathy pains in spite of triple therapy with Lyrica, Cymbalta, and amitriptyline.  Hyperlipidemia treated with simvastatin. Lipids were checked back in December. Blood pressure has been stable. No recent orthostasis.  Past Medical History  Diagnosis Date  . DIABETES-TYPE 2 07/15/2008  . DIABETIC PERIPHERAL NEUROPATHY 09/02/2009  . HYPERTENSION 07/15/2008  . ASTHMA 07/15/2008  . GERD 07/15/2008   Past Surgical History  Procedure Laterality Date  . Appendectomy  1971  . Abdominal hysterectomy  1971  . Tonsillectomy    . Tubal ligation    . Cesarean section    . Flexible sigmoidoscopy      diverticulitis  . Laparotomy      X 3 with adhesions lysis  . Foot surgery      right foot X 2    reports that she has never smoked. She has never used smokeless tobacco. She reports that she does not drink alcohol. Her drug history is not on file. family history includes Arthritis in her mother; Asthma in her sister; Cancer in her paternal grandfather; Celiac disease in her sister; Diabetes in her mother; Heart disease in her father, mother, and sisters; and Stroke in her maternal grandfather. Allergies  Allergen Reactions  . Doxycycline Hyclate     REACTION: vomiting  . Iohexol    Code: HIVES, Desc: pt. states she breaks out in hives 06/15/08   . Morphine Sulfate     REACTION: hallucinations  . Moxifloxacin     REACTION: unknown      Review of Systems  Constitutional: Negative for fatigue.  Eyes: Negative for visual disturbance.  Respiratory: Negative for cough, chest tightness, shortness of breath and wheezing.   Cardiovascular: Negative for chest pain, palpitations and leg swelling.  Neurological: Negative for dizziness, seizures, syncope, weakness, light-headedness and headaches.       Objective:   Physical Exam  Constitutional: She appears well-developed and well-nourished.  HENT:  Mouth/Throat: Oropharynx is clear and moist.  Neck: Neck supple. No thyromegaly present.  Cardiovascular: Normal rate and regular rhythm.   Pulmonary/Chest: Effort normal and breath sounds normal. No respiratory distress. She has no wheezes. She has no rales.  Lymphadenopathy:    She has no cervical adenopathy.  Skin:  Feet reveal no skin lesions. Good distal foot pulses. Good capillary refill. No calluses. Normal sensation with monofilament testing           Assessment & Plan:  #1 type 2 diabetes. Recheck A1c. Patient will research more thoroughly to see what alternative options are for insulin. Most simple switch would be to go from Lantus to Levemir but is not clear that they will cover for any long-acting insulin without being very costly. If we switch to shorter acting insulin, consider mixed such as 70/30 which would give her option of still twice daily injections #  2 hypertension. Stable at goal #3 hyperlipidemia recent lipids reviewed. Stable

## 2012-10-17 NOTE — Patient Instructions (Addendum)
Check with insurance to see if Levemir insulin is less than Lantus

## 2012-10-17 NOTE — Telephone Encounter (Signed)
Pt here for OV today and insulin was addressed

## 2012-10-18 NOTE — Progress Notes (Signed)
Quick Note:  Pt informed ______ 

## 2012-11-04 ENCOUNTER — Other Ambulatory Visit: Payer: Self-pay | Admitting: *Deleted

## 2012-11-04 MED ORDER — CARVEDILOL 25 MG PO TABS: 25.0000 mg | ORAL_TABLET | Freq: Two times a day (BID) | ORAL | Status: DC

## 2012-11-04 NOTE — Telephone Encounter (Signed)
Refill authorized for carvedilol

## 2012-12-13 ENCOUNTER — Encounter: Payer: Self-pay | Admitting: Cardiology

## 2012-12-13 ENCOUNTER — Ambulatory Visit (INDEPENDENT_AMBULATORY_CARE_PROVIDER_SITE_OTHER): Payer: Medicare Other | Admitting: Cardiology

## 2012-12-13 VITALS — BP 146/84 | HR 59 | Ht 61.0 in | Wt 162.4 lb

## 2012-12-13 DIAGNOSIS — Z136 Encounter for screening for cardiovascular disorders: Secondary | ICD-10-CM

## 2012-12-13 DIAGNOSIS — IMO0001 Reserved for inherently not codable concepts without codable children: Secondary | ICD-10-CM

## 2012-12-13 DIAGNOSIS — I10 Essential (primary) hypertension: Secondary | ICD-10-CM

## 2012-12-13 DIAGNOSIS — I739 Peripheral vascular disease, unspecified: Secondary | ICD-10-CM

## 2012-12-13 DIAGNOSIS — R05 Cough: Secondary | ICD-10-CM

## 2012-12-13 DIAGNOSIS — E119 Type 2 diabetes mellitus without complications: Secondary | ICD-10-CM

## 2012-12-13 DIAGNOSIS — E785 Hyperlipidemia, unspecified: Secondary | ICD-10-CM

## 2012-12-13 DIAGNOSIS — R053 Chronic cough: Secondary | ICD-10-CM

## 2012-12-13 DIAGNOSIS — G4730 Sleep apnea, unspecified: Secondary | ICD-10-CM | POA: Insufficient documentation

## 2012-12-13 DIAGNOSIS — R059 Cough, unspecified: Secondary | ICD-10-CM

## 2012-12-13 DIAGNOSIS — E1149 Type 2 diabetes mellitus with other diabetic neurological complication: Secondary | ICD-10-CM

## 2012-12-13 NOTE — Assessment & Plan Note (Signed)
Controlled.  

## 2012-12-13 NOTE — Assessment & Plan Note (Signed)
Doppler OK 9/13

## 2012-12-13 NOTE — Patient Instructions (Signed)
See Dr Allyson Sabal in 6 months

## 2012-12-13 NOTE — Progress Notes (Signed)
12/13/2012 Casey Harrell   08-03-1934  [id number]  Primary Physicia [name], MD Primary Cardiologist: Dr Allyson Sabal  HPI:  Pleasant 78 y/o followed by Dr Allyson Sabal and Dr Caryl Never with multiple medical problems. She has had previous Myoview in 2009 and Feb 2011 that were low risk. She denies angina. She has PVD and has had prior RCE 2/11. She has good LE pulses on exam. She is here for routine check up.   Current Outpatient Prescriptions  Medication Sig Dispense Refill  . albuterol (ACCUNEB) 1.25 MG/3ML nebulizer solution Take 3 mLs (1.25 mg total) by nebulization every 6 (six) hours as needed for wheezing.  3 mL  12  . amitriptyline (ELAVIL) 25 MG tablet TAKE 1 TABLET AT BEDTIME  30 tablet  6  . aspirin 81 MG tablet Take 81 mg by mouth daily.        . budesonide (PULMICORT) 0.25 MG/2ML nebulizer solution Take 2 mLs (0.25 mg total) by nebulization 2 (two) times daily. DX:  493.90  120 mL  5  . Calcium Citrate (CITRACAL PO) Take by mouth 2 (two) times daily.       . carvedilol (COREG) 25 MG tablet Take 1 tablet (25 mg total) by mouth 2 (two) times daily with a meal.  60 tablet  6  . Cholecalciferol (VITAMIN D3) 2000 UNITS TABS Take by mouth daily.       Marland Kitchen CLEVER CHEK AUTO-CODE VOICE test strip       . CYMBALTA 60 MG capsule TAKE ONE CAPSULE BY MOUTH EVERY DAY  30 capsule  11  . dextromethorphan 15 MG/5ML syrup Take 5 mLs by mouth as needed for cough.      . diltiazem (CARDIZEM CD) 120 MG 24 hr capsule Take 120 mg by mouth daily.       . insulin aspart (NOVOLOG FLEXPEN) 100 UNIT/ML injection       . insulin glargine (LANTUS SOLOSTAR) 100 UNIT/ML injection Inject 100 Units into the skin at bedtime.      . Insulin Pen Needle (B-D ULTRAFINE III SHORT PEN) 31G X 8 MM MISC Use daily as instructed  100 each  3  . Lancets MISC       . levothyroxine (SYNTHROID, LEVOTHROID) 50 MCG tablet TAKE 1 TABLET BY MOUTH EVERY DAY  90 tablet  3  . LYRICA 150 MG capsule TAKE ONE CAPSULE BY MOUTH EVERY DAY  30  capsule  5  . magnesium oxide (MAG-OX) 400 (241.3 MG) MG tablet TAKE 1 TABLET BY MOUTH DAILY  30 tablet  2  . metFORMIN (GLUCOPHAGE) 1000 MG tablet Take 1 tablet (1,000 mg total) by mouth 2 (two) times daily with a meal.  60 tablet  11  . mometasone (NASONEX) 50 MCG/ACT nasal spray Place 2 sprays into the nose daily.  17 g  11  . olmesartan (BENICAR) 20 MG tablet Take 20 mg by mouth daily.        . ondansetron (ZOFRAN) 8 MG tablet Take 1 tablet (8 mg total) by mouth every 8 (eight) hours as needed for nausea.  12 tablet  0  . pantoprazole (PROTONIX) 40 MG tablet TAKE 1 TABLET BY MOUTH TWICE A DAY  60 tablet  8  . simvastatin (ZOCOR) 20 MG tablet TAKE 1 TABLET BY MOUTH AT BEDTIME  30 tablet  11   No current facility-administered medications for this visit.    Allergies  Allergen Reactions  . Doxycycline Hyclate     REACTION: vomiting  .  Iohexol      Code: HIVES, Desc: pt. states she breaks out in hives 06/15/08   . Morphine Sulfate     REACTION: hallucinations  . Moxifloxacin     REACTION: unknown    History   Social History  . Marital Status: Married    Spouse Name: N/A    Number of Children: Y  . Years of Education: N/A   Occupational History  . stay at home    Social History Main Topics  . Smoking status: Never Smoker   . Smokeless tobacco: Never Used  . Alcohol Use: No  . Drug Use: Not on file  . Sexually Active: Not on file   Other Topics Concern  . Not on file   Social History Narrative  . No narrative on file     Review of Systems: General: negative for chills, fever, night sweats or weight changes.  Cardiovascular: negative for chest pain, dyspnea on exertion, edema, orthopnea, palpitations, paroxysmal nocturnal dyspnea or shortness of breath Dermatological: negative for rash Respiratory: negative for cough or wheezing Urologic: negative for hematuria Abdominal: negative for nausea, vomiting, diarrhea, bright red blood per rectum, melena, or  hematemesis Neurologic: negative for visual changes, syncope, or dizziness All other systems reviewed and are otherwise negative except as noted above.    Blood pressure 146/84, pulse 59, height 5\' 1"  (1.549 m), weight 162 lb 6.4 oz (73.664 kg).  General appearance: alert, cooperative, no distress and moderately obese Neck: no carotid bruit and RCE scar Lungs: decreased Heart: regular rate and rhythm Extremities: no edema, good distal pulses  EKG  EKG: normal EKG, normal sinus rhythm, unchanged from previous tracings.  ASSESSMENT AND PLAN:   Low risk cardiac nuclear stress test- 2009 and Feb 2011 .  Sleep apnea- C-pap intol .  PVD (peripheral vascular disease)- S/P RCE 06/18/09 Doppler OK 9/13  Chronic cough- followed by Dr Craige Cotta Dr Craige Cotta follows  HYPERTENSION Controlled  DIABETIC PERIPHERAL NEUROPATHY .  DIABETES-TYPE 2 .  DYSLIPIDEMIA Primary follows   PLAN  Same Rx, follow up in 6 months with Dr Mariana Arn [facility] 12/13/2012 3:38 PM

## 2012-12-13 NOTE — Assessment & Plan Note (Signed)
Primary follows 

## 2012-12-13 NOTE — Assessment & Plan Note (Signed)
Dr Craige Cotta follows

## 2013-01-06 ENCOUNTER — Other Ambulatory Visit: Payer: Self-pay | Admitting: Family Medicine

## 2013-01-07 ENCOUNTER — Telehealth: Payer: Self-pay | Admitting: Family Medicine

## 2013-01-07 NOTE — Telephone Encounter (Signed)
Call back attempted at [phone] on 9-2, vmail left.

## 2013-01-08 ENCOUNTER — Encounter: Payer: Medicare Other | Admitting: Family Medicine

## 2013-01-08 DIAGNOSIS — Z0289 Encounter for other administrative examinations: Secondary | ICD-10-CM

## 2013-01-09 ENCOUNTER — Ambulatory Visit (INDEPENDENT_AMBULATORY_CARE_PROVIDER_SITE_OTHER): Payer: Medicare Other | Admitting: Pulmonary Disease

## 2013-01-09 ENCOUNTER — Encounter: Payer: Self-pay | Admitting: Pulmonary Disease

## 2013-01-09 VITALS — BP 150/92 | HR 85 | Temp 97.2°F | Ht 60.0 in | Wt 157.0 lb

## 2013-01-09 DIAGNOSIS — R059 Cough, unspecified: Secondary | ICD-10-CM

## 2013-01-09 DIAGNOSIS — R05 Cough: Secondary | ICD-10-CM

## 2013-01-09 DIAGNOSIS — R053 Chronic cough: Secondary | ICD-10-CM

## 2013-01-09 MED ORDER — BUDESONIDE 0.25 MG/2ML IN SUSP: 0.2500 mg | Freq: Two times a day (BID) | RESPIRATORY_TRACT | Status: DC

## 2013-01-09 NOTE — Patient Instructions (Signed)
Saline nasal spray (neti pot) daily Nasonex two sprays in each nostril daily after using saline nasal spray Budesonide (pulmicort) one vial nebulized twice per day Albuterol one vial nebulized up to four times per day as needed Call in two weeks if cough not getting better Follow up in 6 months

## 2013-01-09 NOTE — Assessment & Plan Note (Signed)
Related to asthma, upper airway cough syndrome, and reflux.  Worsening likely related to not having medications.  Will refill pulmicort.  Advised her to resume neti pot and nasonex.  She is to continue protonix.

## 2013-01-09 NOTE — Progress Notes (Signed)
Chief Complaint  Patient presents with  . Follow-up    Pt states that cough has not improved since last OV. Pt c/o increased dry cough-worse at night and hoarseness.     History of Present Illness: Casey Harrell is a 78 y.o. female with chronic cough, rhinitis, asthma, gerd, and OSA intolerant of CPAP.  Her cough has gotten worse.  She ran out of pulmicort 3 months ago.  She uses albuterol daily and this helps.  She has not been using neti pot or nasonex >> these helped when she used them.  She takes protonix in the morning.  She usually has dry cough.  She gets sinus drainage.  She has hoarseness, but no worse than usual.  She is not having wheeze.  TESTS: Spirometry 11/25/08 >> FEV1 1.48(86%), FVC 1.85(75%), FEV1% 80  IgE 01/29/09 >> 86.5 PFT 09/15/11 >> FEV1 1.89 (99%), FEV1% 88, TLC 4.25 (89%), DLCO 70%, no BD  She  has a past medical history of DIABETES-TYPE 2 (07/15/2008); DIABETIC PERIPHERAL NEUROPATHY (09/02/2009); HYPERTENSION (07/15/2008); ASTHMA (07/15/2008); GERD (07/15/2008); PVD (peripheral vascular disease) (06/18/09); and Normal cardiac stress test.  She  has past surgical history that includes Appendectomy (1971); Abdominal hysterectomy (1971); Tonsillectomy; Tubal ligation; Cesarean section; Flexible sigmoidoscopy; laparotomy; Foot surgery; and Carotid endarterectomy (06/18/09).   Outpatient Encounter Prescriptions as of 01/09/2013  Medication Sig Dispense Refill  . albuterol (ACCUNEB) 1.25 MG/3ML nebulizer solution Take 3 mLs (1.25 mg total) by nebulization every 6 (six) hours as needed for wheezing.  3 mL  12  . amitriptyline (ELAVIL) 25 MG tablet TAKE 1 TABLET AT BEDTIME  30 tablet  6  . aspirin 81 MG tablet Take 81 mg by mouth daily.        . B-D ULTRAFINE III SHORT PEN 31G X 8 MM MISC USE AS DIRECTED  100 each  3  . budesonide (PULMICORT) 0.25 MG/2ML nebulizer solution Take 2 mLs (0.25 mg total) by nebulization 2 (two) times daily. DX:  493.90  120 mL  5  . Calcium Citrate  (CITRACAL PO) Take by mouth 2 (two) times daily.       . carvedilol (COREG) 25 MG tablet Take 1 tablet (25 mg total) by mouth 2 (two) times daily with a meal.  60 tablet  6  . Cholecalciferol (VITAMIN D3) 2000 UNITS TABS Take by mouth daily.       Marland Kitchen CLEVER CHEK AUTO-CODE VOICE test strip       . CYMBALTA 60 MG capsule TAKE ONE CAPSULE BY MOUTH EVERY DAY  30 capsule  11  . dextromethorphan 15 MG/5ML syrup Take 5 mLs by mouth as needed for cough.      . diltiazem (CARDIZEM CD) 120 MG 24 hr capsule Take 120 mg by mouth daily.       . insulin aspart (NOVOLOG FLEXPEN) 100 UNIT/ML injection       . insulin glargine (LANTUS SOLOSTAR) 100 UNIT/ML injection Inject 100 Units into the skin at bedtime.      . Lancets MISC       . levothyroxine (SYNTHROID, LEVOTHROID) 50 MCG tablet TAKE 1 TABLET BY MOUTH EVERY DAY  90 tablet  3  . LYRICA 150 MG capsule TAKE ONE CAPSULE BY MOUTH EVERY DAY  30 capsule  5  . magnesium oxide (MAG-OX) 400 (241.3 MG) MG tablet TAKE 1 TABLET BY MOUTH DAILY  30 tablet  2  . metFORMIN (GLUCOPHAGE) 1000 MG tablet Take 1 tablet (1,000 mg total) by mouth 2 (  two) times daily with a meal.  60 tablet  11  . mometasone (NASONEX) 50 MCG/ACT nasal spray Place 2 sprays into the nose daily.  17 g  11  . olmesartan (BENICAR) 20 MG tablet Take 20 mg by mouth daily.        . ondansetron (ZOFRAN) 8 MG tablet Take 1 tablet (8 mg total) by mouth every 8 (eight) hours as needed for nausea.  12 tablet  0  . pantoprazole (PROTONIX) 40 MG tablet TAKE 1 TABLET BY MOUTH TWICE A DAY  60 tablet  8  . simvastatin (ZOCOR) 20 MG tablet TAKE 1 TABLET BY MOUTH AT BEDTIME  30 tablet  11   No facility-administered encounter medications on file as of 01/09/2013.    Allergies  Allergen Reactions  . Doxycycline Hyclate     REACTION: vomiting  . Iohexol      Code: HIVES, Desc: pt. states she breaks out in hives 06/15/08   . Morphine Sulfate     REACTION: hallucinations  . Moxifloxacin     REACTION: unknown     Physical Exam:  General - No distress ENT - No sinus tenderness, clear nasal discharge, no oral exudate, no LAN, TM clear Cardiac - s1s2 regular, no murmur Chest - No wheeze/rales/dullness Back - No focal tenderness Abd - Soft, non-tender Ext - No edema Neuro - Normal strength Skin - No rashes Psych - normal mood, and behavior   Assessment/Plan:  Casey Helling, MD [facility] Pager:  [phone] 01/09/2013, 1:38 PM

## 2013-01-10 ENCOUNTER — Encounter: Payer: Self-pay | Admitting: Family Medicine

## 2013-01-10 ENCOUNTER — Ambulatory Visit (INDEPENDENT_AMBULATORY_CARE_PROVIDER_SITE_OTHER): Payer: Medicare Other | Admitting: Family Medicine

## 2013-01-10 VITALS — BP 132/68 | HR 72 | Temp 98.1°F | Wt 159.0 lb

## 2013-01-10 DIAGNOSIS — E669 Obesity, unspecified: Secondary | ICD-10-CM | POA: Insufficient documentation

## 2013-01-10 DIAGNOSIS — Z23 Encounter for immunization: Secondary | ICD-10-CM

## 2013-01-10 DIAGNOSIS — E119 Type 2 diabetes mellitus without complications: Secondary | ICD-10-CM

## 2013-01-10 DIAGNOSIS — E039 Hypothyroidism, unspecified: Secondary | ICD-10-CM

## 2013-01-10 DIAGNOSIS — I10 Essential (primary) hypertension: Secondary | ICD-10-CM

## 2013-01-10 LAB — HEMOGLOBIN A1C: Hgb A1c MFr Bld: 7 % — ABNORMAL HIGH (ref 4.6–6.5)

## 2013-01-10 NOTE — Progress Notes (Signed)
  Subjective:    Patient ID: Casey Harrell, female    DOB: [birth year], 78 y.o.   MRN: [id number]  HPI Medical followup. She has history of COPD, type 2 diabetes, GERD, hypertension, hypothyroidism, chronic diabetic peripheral neuropathy COPD is stable. She has not yet had a flu vaccine. She is compliant with budesonide her regular inhalers.  Type 2 diabetes. Blood sugars vary considerably. Ranging from 70-200 fasting. Mostly low 100s. Last A1c 7.2%. No recent hypoglycemia. She is having concerned regarding cost of her insulin.  Hypertension has been well controlled. She is requesting samples of Benicar. She's compliant with all her medications. No orthostasis She's lost about 8 pounds due to her efforts at making some dietary changes  Past Medical History  Diagnosis Date  . DIABETES-TYPE 2 07/15/2008  . DIABETIC PERIPHERAL NEUROPATHY 09/02/2009  . HYPERTENSION 07/15/2008  . ASTHMA 07/15/2008  . GERD 07/15/2008  . PVD (peripheral vascular disease) 06/18/09    RCE  . Normal cardiac stress test     low risk Nuc 2009, Feb 2011   Past Surgical History  Procedure Laterality Date  . Appendectomy  1971  . Abdominal hysterectomy  1971  . Tonsillectomy    . Tubal ligation    . Cesarean section    . Flexible sigmoidoscopy      diverticulitis  . Laparotomy      X 3 with adhesions lysis  . Foot surgery      right foot X 2  . Carotid endarterectomy  06/18/09    RCE    reports that she has never smoked. She has never used smokeless tobacco. She reports that she does not drink alcohol. Her drug history is not on file. family history includes Arthritis in her mother; Asthma in her sister; Cancer in her paternal grandfather; Celiac disease in her sister; Diabetes in her mother; Heart disease in her father, mother, sister, and sister; Stroke in her maternal grandfather. Allergies  Allergen Reactions  . Doxycycline Hyclate     REACTION: vomiting  . Iohexol      Code: HIVES, Desc: pt. states she  breaks out in hives 06/15/08   . Morphine Sulfate     REACTION: hallucinations  . Moxifloxacin     REACTION: unknown      Review of Systems  Constitutional: Positive for fatigue.  Eyes: Negative for visual disturbance.  Respiratory: Negative for cough, chest tightness, shortness of breath and wheezing.   Cardiovascular: Negative for chest pain, palpitations and leg swelling.  Neurological: Negative for dizziness, seizures, syncope, weakness, light-headedness and headaches.       Objective:   Physical Exam  Constitutional: She appears well-developed and well-nourished.  Neck: Neck supple. No thyromegaly present.  Cardiovascular: Normal rate and regular rhythm.   Pulmonary/Chest: Effort normal and breath sounds normal. No respiratory distress. She has no wheezes. She has no rales.  Musculoskeletal: She exhibits no edema.          Assessment & Plan:  #1 type 2 diabetes. History of fair control. Recheck A1c. Samples of Lantus given. Continue yearly eye exam #2 hypertension adequately controlled. Samples of Benicar given #3 health maintenance. Influenza vaccine given. Pneumovax booster

## 2013-01-14 ENCOUNTER — Other Ambulatory Visit: Payer: Self-pay | Admitting: Family Medicine

## 2013-01-14 ENCOUNTER — Telehealth: Payer: Self-pay | Admitting: Pulmonary Disease

## 2013-01-14 NOTE — Telephone Encounter (Signed)
I spoke with the pt and she states she had to take her Rx to [facility] and they advised that they needed to speak with the doctor before filling RX. So I called [facility] and was advised it has been processed and will cost the pt $52 and is ready for pick-up. Nothing further needed. I advised the pt. Casey Harrell, CMA

## 2013-01-15 ENCOUNTER — Telehealth: Payer: Self-pay | Admitting: *Deleted

## 2013-01-15 NOTE — Telephone Encounter (Signed)
Open in error

## 2013-01-24 ENCOUNTER — Other Ambulatory Visit: Payer: Self-pay | Admitting: Family Medicine

## 2013-01-24 NOTE — Telephone Encounter (Signed)
Refill for 6 months. 

## 2013-01-24 NOTE — Telephone Encounter (Signed)
Last refill 07/18/12 #30 5 refill

## 2013-01-26 ENCOUNTER — Other Ambulatory Visit: Payer: Self-pay | Admitting: Family Medicine

## 2013-02-03 ENCOUNTER — Inpatient Hospital Stay (HOSPITAL_COMMUNITY)
Admission: EM | Admit: 2013-02-03 | Discharge: 2013-02-06 | DRG: 190 | Disposition: A | Discharge status: Home / Self Care | Payer: Medicare Other | Attending: Family Medicine | Admitting: Family Medicine

## 2013-02-03 ENCOUNTER — Emergency Department (HOSPITAL_COMMUNITY): Payer: Medicare Other

## 2013-02-03 ENCOUNTER — Encounter (HOSPITAL_COMMUNITY): Payer: Self-pay

## 2013-02-03 DIAGNOSIS — E1149 Type 2 diabetes mellitus with other diabetic neurological complication: Secondary | ICD-10-CM

## 2013-02-03 DIAGNOSIS — E785 Hyperlipidemia, unspecified: Secondary | ICD-10-CM

## 2013-02-03 DIAGNOSIS — Z66 Do not resuscitate: Secondary | ICD-10-CM | POA: Diagnosis present

## 2013-02-03 DIAGNOSIS — J209 Acute bronchitis, unspecified: Secondary | ICD-10-CM | POA: Diagnosis present

## 2013-02-03 DIAGNOSIS — R053 Chronic cough: Secondary | ICD-10-CM

## 2013-02-03 DIAGNOSIS — J019 Acute sinusitis, unspecified: Secondary | ICD-10-CM | POA: Diagnosis not present

## 2013-02-03 DIAGNOSIS — F32A Depression, unspecified: Secondary | ICD-10-CM

## 2013-02-03 DIAGNOSIS — E1140 Type 2 diabetes mellitus with diabetic neuropathy, unspecified: Secondary | ICD-10-CM | POA: Diagnosis present

## 2013-02-03 DIAGNOSIS — E1142 Type 2 diabetes mellitus with diabetic polyneuropathy: Secondary | ICD-10-CM | POA: Diagnosis present

## 2013-02-03 DIAGNOSIS — D72829 Elevated white blood cell count, unspecified: Secondary | ICD-10-CM

## 2013-02-03 DIAGNOSIS — E662 Morbid (severe) obesity with alveolar hypoventilation: Secondary | ICD-10-CM | POA: Diagnosis present

## 2013-02-03 DIAGNOSIS — F329 Major depressive disorder, single episode, unspecified: Secondary | ICD-10-CM | POA: Diagnosis present

## 2013-02-03 DIAGNOSIS — Z791 Long term (current) use of non-steroidal anti-inflammatories (NSAID): Secondary | ICD-10-CM

## 2013-02-03 DIAGNOSIS — J9600 Acute respiratory failure, unspecified whether with hypoxia or hypercapnia: Secondary | ICD-10-CM | POA: Diagnosis present

## 2013-02-03 DIAGNOSIS — J449 Chronic obstructive pulmonary disease, unspecified: Secondary | ICD-10-CM | POA: Diagnosis present

## 2013-02-03 DIAGNOSIS — J4530 Mild persistent asthma, uncomplicated: Secondary | ICD-10-CM

## 2013-02-03 DIAGNOSIS — R509 Fever, unspecified: Secondary | ICD-10-CM

## 2013-02-03 DIAGNOSIS — J440 Chronic obstructive pulmonary disease with acute lower respiratory infection: Secondary | ICD-10-CM | POA: Diagnosis present

## 2013-02-03 DIAGNOSIS — IMO0001 Reserved for inherently not codable concepts without codable children: Secondary | ICD-10-CM

## 2013-02-03 DIAGNOSIS — Z794 Long term (current) use of insulin: Secondary | ICD-10-CM

## 2013-02-03 DIAGNOSIS — Z7982 Long term (current) use of aspirin: Secondary | ICD-10-CM

## 2013-02-03 DIAGNOSIS — I10 Essential (primary) hypertension: Secondary | ICD-10-CM

## 2013-02-03 DIAGNOSIS — J45901 Unspecified asthma with (acute) exacerbation: Principal | ICD-10-CM | POA: Diagnosis present

## 2013-02-03 DIAGNOSIS — T380X5A Adverse effect of glucocorticoids and synthetic analogues, initial encounter: Secondary | ICD-10-CM | POA: Diagnosis not present

## 2013-02-03 DIAGNOSIS — E669 Obesity, unspecified: Secondary | ICD-10-CM

## 2013-02-03 DIAGNOSIS — Z6829 Body mass index (BMI) 29.0-29.9, adult: Secondary | ICD-10-CM

## 2013-02-03 DIAGNOSIS — E871 Hypo-osmolality and hyponatremia: Secondary | ICD-10-CM | POA: Diagnosis present

## 2013-02-03 DIAGNOSIS — H9209 Otalgia, unspecified ear: Secondary | ICD-10-CM | POA: Diagnosis not present

## 2013-02-03 DIAGNOSIS — G4733 Obstructive sleep apnea (adult) (pediatric): Secondary | ICD-10-CM | POA: Diagnosis present

## 2013-02-03 DIAGNOSIS — J441 Chronic obstructive pulmonary disease with (acute) exacerbation: Principal | ICD-10-CM | POA: Diagnosis present

## 2013-02-03 DIAGNOSIS — E119 Type 2 diabetes mellitus without complications: Secondary | ICD-10-CM

## 2013-02-03 DIAGNOSIS — E1165 Type 2 diabetes mellitus with hyperglycemia: Secondary | ICD-10-CM | POA: Diagnosis present

## 2013-02-03 DIAGNOSIS — K219 Gastro-esophageal reflux disease without esophagitis: Secondary | ICD-10-CM

## 2013-02-03 DIAGNOSIS — J4489 Other specified chronic obstructive pulmonary disease: Secondary | ICD-10-CM

## 2013-02-03 DIAGNOSIS — F3289 Other specified depressive episodes: Secondary | ICD-10-CM | POA: Diagnosis present

## 2013-02-03 DIAGNOSIS — R05 Cough: Secondary | ICD-10-CM

## 2013-02-03 DIAGNOSIS — K5730 Diverticulosis of large intestine without perforation or abscess without bleeding: Secondary | ICD-10-CM

## 2013-02-03 DIAGNOSIS — G4730 Sleep apnea, unspecified: Secondary | ICD-10-CM

## 2013-02-03 DIAGNOSIS — Z79899 Other long term (current) drug therapy: Secondary | ICD-10-CM

## 2013-02-03 DIAGNOSIS — I739 Peripheral vascular disease, unspecified: Secondary | ICD-10-CM

## 2013-02-03 DIAGNOSIS — J9601 Acute respiratory failure with hypoxia: Secondary | ICD-10-CM | POA: Diagnosis present

## 2013-02-03 DIAGNOSIS — J309 Allergic rhinitis, unspecified: Secondary | ICD-10-CM | POA: Diagnosis present

## 2013-02-03 DIAGNOSIS — E039 Hypothyroidism, unspecified: Secondary | ICD-10-CM

## 2013-02-03 DIAGNOSIS — R51 Headache: Secondary | ICD-10-CM | POA: Diagnosis not present

## 2013-02-03 HISTORY — DX: Chronic obstructive pulmonary disease, unspecified: J44.9

## 2013-02-03 LAB — GLUCOSE, CAPILLARY: Glucose-Capillary: 110 mg/dL — ABNORMAL HIGH (ref 70–99)

## 2013-02-03 LAB — BASIC METABOLIC PANEL
BUN: 14 mg/dL (ref 6–23)
CO2: 27 mEq/L (ref 19–32)
Calcium: 9.6 mg/dL (ref 8.4–10.5)
Chloride: 95 mEq/L — ABNORMAL LOW (ref 96–112)
Creatinine, Ser: 0.81 mg/dL (ref 0.50–1.10)
GFR calc Af Amer: 80 mL/min — ABNORMAL LOW (ref >90)
GFR calc non Af Amer: 69 mL/min — ABNORMAL LOW (ref >90)
Glucose, Bld: 101 mg/dL — ABNORMAL HIGH (ref 70–99)
Potassium: 4.4 mEq/L (ref 3.5–5.1)
Sodium: 132 mEq/L — ABNORMAL LOW (ref 135–145)

## 2013-02-03 LAB — HEPATIC FUNCTION PANEL
ALT: 8 U/L (ref 0–35)
AST: 22 U/L (ref 0–37)
Albumin: 3.8 g/dL (ref 3.5–5.2)
Alkaline Phosphatase: 111 U/L (ref 39–117)
Bilirubin, Direct: <0.1 mg/dL (ref 0.0–0.3)
Total Bilirubin: 0.2 mg/dL — ABNORMAL LOW (ref 0.3–1.2)
Total Protein: 8.4 g/dL — ABNORMAL HIGH (ref 6.0–8.3)

## 2013-02-03 LAB — CBC
HCT: 40.4 % (ref 36.0–46.0)
Hemoglobin: 13.2 g/dL (ref 12.0–15.0)
MCH: 28.1 pg (ref 26.0–34.0)
MCHC: 32.7 g/dL (ref 30.0–36.0)
MCV: 86 fL (ref 78.0–100.0)
Platelets: 278 10*3/uL (ref 150–400)
RBC: 4.7 MIL/uL (ref 3.87–5.11)
RDW: 14.5 % (ref 11.5–15.5)
WBC: 19.8 10*3/uL — ABNORMAL HIGH (ref 4.0–10.5)

## 2013-02-03 LAB — LACTIC ACID, PLASMA: Lactic Acid, Venous: 1.3 mmol/L (ref 0.5–2.2)

## 2013-02-03 MED ORDER — PANTOPRAZOLE SODIUM 40 MG PO TBEC
40.0000 mg | DELAYED_RELEASE_TABLET | Freq: Two times a day (BID) | ORAL | Status: DC
  Administered 2013-02-03 – 2013-02-06 (×6): 40 mg via ORAL
  Filled 2013-02-03 (×7): qty 1

## 2013-02-03 MED ORDER — BUDESONIDE 0.25 MG/2ML IN SUSP
0.2500 mg | Freq: Two times a day (BID) | RESPIRATORY_TRACT | Status: DC
  Administered 2013-02-04 – 2013-02-06 (×5): 0.25 mg via RESPIRATORY_TRACT
  Filled 2013-02-03 (×11): qty 2

## 2013-02-03 MED ORDER — ACETAMINOPHEN 650 MG RE SUPP: 650.0000 mg | Freq: Four times a day (QID) | RECTAL | Status: DC | PRN

## 2013-02-03 MED ORDER — ALBUTEROL SULFATE (5 MG/ML) 0.5% IN NEBU
2.5000 mg | INHALATION_SOLUTION | Freq: Three times a day (TID) | RESPIRATORY_TRACT | Status: DC
  Administered 2013-02-04 – 2013-02-06 (×8): 2.5 mg via RESPIRATORY_TRACT
  Filled 2013-02-03 (×8): qty 0.5

## 2013-02-03 MED ORDER — HEPARIN SODIUM (PORCINE) 5000 UNIT/ML IJ SOLN
5000.0000 [IU] | Freq: Three times a day (TID) | INTRAMUSCULAR | Status: DC
  Administered 2013-02-03 – 2013-02-06 (×9): 5000 [IU] via SUBCUTANEOUS
  Filled 2013-02-03 (×11): qty 1

## 2013-02-03 MED ORDER — IPRATROPIUM BROMIDE 0.02 % IN SOLN
0.5000 mg | RESPIRATORY_TRACT | Status: DC
  Administered 2013-02-03 (×2): 0.5 mg via RESPIRATORY_TRACT
  Filled 2013-02-03 (×3): qty 2.5

## 2013-02-03 MED ORDER — ASPIRIN 81 MG PO TABS
81.0000 mg | ORAL_TABLET | Freq: Every day | ORAL | Status: DC
  Administered 2013-02-03: 81 mg via ORAL
  Filled 2013-02-03 (×2): qty 1

## 2013-02-03 MED ORDER — DULOXETINE HCL 60 MG PO CPEP
60.0000 mg | ORAL_CAPSULE | Freq: Every day | ORAL | Status: DC
  Administered 2013-02-04 – 2013-02-06 (×3): 60 mg via ORAL
  Filled 2013-02-03 (×3): qty 1

## 2013-02-03 MED ORDER — LEVOTHYROXINE SODIUM 50 MCG PO TABS
50.0000 ug | ORAL_TABLET | Freq: Every day | ORAL | Status: DC
  Administered 2013-02-04 – 2013-02-06 (×3): 50 ug via ORAL
  Filled 2013-02-03 (×4): qty 1

## 2013-02-03 MED ORDER — INSULIN ASPART 100 UNIT/ML ~~LOC~~ SOLN
0.0000 [IU] | Freq: Three times a day (TID) | SUBCUTANEOUS | Status: DC
  Administered 2013-02-04: 11 [IU] via SUBCUTANEOUS

## 2013-02-03 MED ORDER — HYDROCODONE-ACETAMINOPHEN 5-325 MG PO TABS
1.0000 | ORAL_TABLET | Freq: Four times a day (QID) | ORAL | Status: DC | PRN
  Administered 2013-02-05: 2 via ORAL
  Filled 2013-02-03: qty 2

## 2013-02-03 MED ORDER — DEXTROSE 5 % IV SOLN
500.0000 mg | Freq: Once | INTRAVENOUS | Status: AC
  Administered 2013-02-03: 500 mg via INTRAVENOUS
  Filled 2013-02-03: qty 500

## 2013-02-03 MED ORDER — DM-GUAIFENESIN ER 30-600 MG PO TB12
1.0000 | ORAL_TABLET | Freq: Two times a day (BID) | ORAL | Status: DC
  Administered 2013-02-03 – 2013-02-06 (×6): 1 via ORAL
  Filled 2013-02-03 (×7): qty 1

## 2013-02-03 MED ORDER — CEFTRIAXONE SODIUM 1 G IJ SOLR
1.0000 g | INTRAMUSCULAR | Status: DC
  Administered 2013-02-03 – 2013-02-04 (×2): 1 g via INTRAVENOUS
  Filled 2013-02-03 (×2): qty 10

## 2013-02-03 MED ORDER — HYDROMORPHONE HCL PF 1 MG/ML IJ SOLN: 0.5000 mg | INTRAMUSCULAR | Status: DC | PRN

## 2013-02-03 MED ORDER — IRBESARTAN 150 MG PO TABS
150.0000 mg | ORAL_TABLET | Freq: Every day | ORAL | Status: DC
  Administered 2013-02-04 – 2013-02-06 (×3): 150 mg via ORAL
  Filled 2013-02-03 (×3): qty 1

## 2013-02-03 MED ORDER — ALBUTEROL SULFATE (5 MG/ML) 0.5% IN NEBU
2.5000 mg | INHALATION_SOLUTION | RESPIRATORY_TRACT | Status: DC
  Administered 2013-02-03 (×2): 2.5 mg via RESPIRATORY_TRACT
  Filled 2013-02-03 (×3): qty 0.5

## 2013-02-03 MED ORDER — CARVEDILOL 25 MG PO TABS
25.0000 mg | ORAL_TABLET | Freq: Two times a day (BID) | ORAL | Status: DC
  Administered 2013-02-04 – 2013-02-06 (×5): 25 mg via ORAL
  Filled 2013-02-03 (×7): qty 1

## 2013-02-03 MED ORDER — ATORVASTATIN CALCIUM 40 MG PO TABS
40.0000 mg | ORAL_TABLET | Freq: Every day | ORAL | Status: DC
  Administered 2013-02-03: 40 mg via ORAL
  Filled 2013-02-03 (×2): qty 1

## 2013-02-03 MED ORDER — INSULIN GLARGINE 100 UNIT/ML ~~LOC~~ SOLN
60.0000 [IU] | Freq: Every morning | SUBCUTANEOUS | Status: DC
  Administered 2013-02-04: 60 [IU] via SUBCUTANEOUS
  Filled 2013-02-03: qty 0.6

## 2013-02-03 MED ORDER — ALBUTEROL SULFATE (5 MG/ML) 0.5% IN NEBU
2.5000 mg | INHALATION_SOLUTION | RESPIRATORY_TRACT | Status: DC | PRN
  Administered 2013-02-04 – 2013-02-05 (×2): 2.5 mg via RESPIRATORY_TRACT
  Filled 2013-02-03 (×2): qty 0.5

## 2013-02-03 MED ORDER — SODIUM CHLORIDE 0.9 % IV BOLUS (SEPSIS)
1000.0000 mL | Freq: Once | INTRAVENOUS | Status: AC
  Administered 2013-02-03: 1000 mL via INTRAVENOUS

## 2013-02-03 MED ORDER — SIMVASTATIN 20 MG PO TABS: 20.0000 mg | ORAL_TABLET | Freq: Every evening | ORAL | Status: DC

## 2013-02-03 MED ORDER — MAGNESIUM OXIDE 400 MG PO TABS
400.0000 mg | ORAL_TABLET | Freq: Every day | ORAL | Status: DC
  Administered 2013-02-04 – 2013-02-06 (×3): 400 mg via ORAL
  Filled 2013-02-03 (×3): qty 1

## 2013-02-03 MED ORDER — TETRACAINE HCL 0.5 % OP SOLN: 2.0000 [drp] | Freq: Once | OPHTHALMIC | Status: DC

## 2013-02-03 MED ORDER — SODIUM CHLORIDE 0.9 % IV SOLN
INTRAVENOUS | Status: AC
  Administered 2013-02-03: 22:00:00 via INTRAVENOUS

## 2013-02-03 MED ORDER — METHYLPREDNISOLONE SODIUM SUCC 125 MG IJ SOLR
60.0000 mg | Freq: Three times a day (TID) | INTRAMUSCULAR | Status: DC
  Administered 2013-02-03 – 2013-02-04 (×2): 60 mg via INTRAVENOUS
  Filled 2013-02-03 (×5): qty 0.96

## 2013-02-03 MED ORDER — ONDANSETRON HCL 4 MG PO TABS: 4.0000 mg | ORAL_TABLET | Freq: Four times a day (QID) | ORAL | Status: DC | PRN

## 2013-02-03 MED ORDER — DILTIAZEM HCL ER COATED BEADS 120 MG PO CP24
120.0000 mg | ORAL_CAPSULE | Freq: Every day | ORAL | Status: DC
  Administered 2013-02-04 – 2013-02-06 (×3): 120 mg via ORAL
  Filled 2013-02-03 (×3): qty 1

## 2013-02-03 MED ORDER — ONDANSETRON HCL 4 MG/2ML IJ SOLN: 4.0000 mg | Freq: Four times a day (QID) | INTRAMUSCULAR | Status: DC | PRN

## 2013-02-03 MED ORDER — ACETAMINOPHEN 500 MG PO TABS
1000.0000 mg | ORAL_TABLET | Freq: Once | ORAL | Status: AC
  Administered 2013-02-03: 1000 mg via ORAL
  Filled 2013-02-03: qty 2

## 2013-02-03 MED ORDER — IPRATROPIUM BROMIDE 0.02 % IN SOLN
0.5000 mg | Freq: Three times a day (TID) | RESPIRATORY_TRACT | Status: DC
  Administered 2013-02-04 – 2013-02-06 (×8): 0.5 mg via RESPIRATORY_TRACT
  Filled 2013-02-03 (×8): qty 2.5

## 2013-02-03 MED ORDER — ACETAMINOPHEN 325 MG PO TABS
650.0000 mg | ORAL_TABLET | Freq: Four times a day (QID) | ORAL | Status: DC | PRN
  Administered 2013-02-04 – 2013-02-05 (×3): 650 mg via ORAL
  Filled 2013-02-03 (×3): qty 2

## 2013-02-03 MED ORDER — METHYLPREDNISOLONE SODIUM SUCC 125 MG IJ SOLR
125.0000 mg | Freq: Once | INTRAMUSCULAR | Status: AC
  Administered 2013-02-03: 125 mg via INTRAVENOUS
  Filled 2013-02-03: qty 2

## 2013-02-03 MED ORDER — AMITRIPTYLINE HCL 25 MG PO TABS
25.0000 mg | ORAL_TABLET | Freq: Every day | ORAL | Status: DC
  Administered 2013-02-03 – 2013-02-05 (×3): 25 mg via ORAL
  Filled 2013-02-03 (×4): qty 1

## 2013-02-03 MED ORDER — PREGABALIN 75 MG PO CAPS
150.0000 mg | ORAL_CAPSULE | Freq: Every day | ORAL | Status: DC
  Administered 2013-02-04 – 2013-02-06 (×3): 150 mg via ORAL
  Filled 2013-02-03 (×3): qty 2

## 2013-02-03 MED ORDER — VITAMIN D3 25 MCG (1000 UNIT) PO TABS
2000.0000 [IU] | ORAL_TABLET | Freq: Every day | ORAL | Status: DC
  Administered 2013-02-04 – 2013-02-06 (×3): 2000 [IU] via ORAL
  Filled 2013-02-03 (×3): qty 2

## 2013-02-03 MED ORDER — DEXTROSE 5 % IV SOLN
1.0000 g | Freq: Once | INTRAVENOUS | Status: AC
  Administered 2013-02-03: 1 g via INTRAVENOUS
  Filled 2013-02-03: qty 10

## 2013-02-03 MED ORDER — DEXTROSE 5 % IV SOLN
250.0000 mg | INTRAVENOUS | Status: DC
  Administered 2013-02-03 – 2013-02-04 (×2): 250 mg via INTRAVENOUS
  Filled 2013-02-03 (×3): qty 250

## 2013-02-03 NOTE — ED Provider Notes (Signed)
CSN: [id number]     Arrival date & time 02/03/13  1425 History   First MD Initiated Contact with Patient 02/03/13 1504     Chief Complaint  Patient presents with  . Pneumonia   (Consider location/radiation/quality/duration/timing/severity/associated sxs/prior Treatment) HPI Comments: General malaise for past week. Worsening productive cough, shortness of breath, worse today. Fever today. No vomiting/diarrhea/abdominal pain. Patient sent from Urgent Care for concerns of pneumonia.  No recent hospitalization or prior illnesses. No prior URI symptoms.   Patient is a 78 y.o. female presenting with pneumonia. The history is provided by the patient and the spouse.  Pneumonia This is a new problem. The current episode started more than 2 days ago. The problem occurs constantly. The problem has been gradually worsening. Associated symptoms include shortness of breath. Pertinent negatives include no chest pain, no abdominal pain and no headaches. Nothing aggravates the symptoms. Nothing relieves the symptoms.    Past Medical History  Diagnosis Date  . DIABETES-TYPE 2 07/15/2008  . DIABETIC PERIPHERAL NEUROPATHY 09/02/2009  . HYPERTENSION 07/15/2008  . ASTHMA 07/15/2008  . GERD 07/15/2008  . PVD (peripheral vascular disease) 06/18/09    RCE  . Normal cardiac stress test     low risk Nuc 2009, Feb 2011  . COPD (chronic obstructive pulmonary disease)    Past Surgical History  Procedure Laterality Date  . Appendectomy  1971  . Abdominal hysterectomy  1971  . Tonsillectomy    . Tubal ligation    . Cesarean section    . Flexible sigmoidoscopy      diverticulitis  . Laparotomy      X 3 with adhesions lysis  . Foot surgery      right foot X 2  . Carotid endarterectomy  06/18/09    RCE   Family History  Problem Relation Age of Onset  . Diabetes Mother   . Arthritis Mother   . Heart disease Father   . Celiac disease Sister   . Stroke Maternal Grandfather   . Cancer Paternal Grandfather      lung  . Heart disease Mother   . Heart disease Sister   . Heart disease Sister   . Asthma Sister    History  Substance Use Topics  . Smoking status: Never Smoker   . Smokeless tobacco: Never Used  . Alcohol Use: No   OB History   Grav Para Term Preterm Abortions TAB SAB Ect Mult Living                 Review of Systems  Constitutional: Positive for fever (today).  Respiratory: Positive for cough (productive) and shortness of breath.   Cardiovascular: Negative for chest pain.  Gastrointestinal: Negative for abdominal pain.  Neurological: Negative for headaches.  All other systems reviewed and are negative.    Allergies  Doxycycline hyclate; Iohexol; Morphine sulfate; and Moxifloxacin  Home Medications   Current Outpatient Rx  Name  Route  Sig  Dispense  Refill  . albuterol (ACCUNEB) 1.25 MG/3ML nebulizer solution   Nebulization   Take 1 ampule by nebulization every 6 (six) hours as needed for wheezing.         Marland Kitchen amitriptyline (ELAVIL) 25 MG tablet   Oral   Take 25 mg by mouth at bedtime.         Marland Kitchen aspirin 81 MG tablet   Oral   Take 81 mg by mouth daily.           . budesonide (PULMICORT) 0.25  MG/2ML nebulizer solution   Nebulization   Take 0.25 mg by nebulization daily.         . Calcium Citrate (CITRACAL PO)   Oral   Take 1 tablet by mouth 2 (two) times daily.          . carvedilol (COREG) 25 MG tablet   Oral   Take 25 mg by mouth 2 (two) times daily with a meal.         . Cholecalciferol (VITAMIN D3) 2000 UNITS TABS   Oral   Take 2,000 Units by mouth daily.          Marland Kitchen dextromethorphan 15 MG/5ML syrup   Oral   Take 5 mLs by mouth 3 (three) times daily as needed for cough.          . diltiazem (CARDIZEM CD) 120 MG 24 hr capsule   Oral   Take 120 mg by mouth daily.          . DULoxetine (CYMBALTA) 60 MG capsule   Oral   Take 60 mg by mouth daily.         . insulin aspart (NOVOLOG FLEXPEN) 100 UNIT/ML injection    Subcutaneous   Inject 9-14 Units into the skin 2 (two) times daily at 8 am and 10 pm. 9 units in am and 14 units at bedtime         . insulin glargine (LANTUS) 100 UNIT/ML injection   Subcutaneous   Inject 90 Units into the skin every morning.         Marland Kitchen levothyroxine (SYNTHROID, LEVOTHROID) 50 MCG tablet   Oral   Take 50 mcg by mouth daily before breakfast.         . magnesium oxide (MAG-OX) 400 MG tablet   Oral   Take 400 mg by mouth daily.         . metFORMIN (GLUCOPHAGE) 1000 MG tablet   Oral   Take 1,000 mg by mouth 2 (two) times daily with a meal.         . olmesartan (BENICAR) 20 MG tablet   Oral   Take 20 mg by mouth daily.           . pantoprazole (PROTONIX) 40 MG tablet   Oral   Take 40 mg by mouth daily.         . pregabalin (LYRICA) 150 MG capsule   Oral   Take 150 mg by mouth daily.         . simvastatin (ZOCOR) 20 MG tablet   Oral   Take 20 mg by mouth every evening.         . B-D ULTRAFINE III SHORT PEN 31G X 8 MM MISC      USE AS DIRECTED   100 each   3   . CLEVER CHEK AUTO-CODE VOICE test strip               . Lancets MISC                BP 167/76  Pulse 88  Temp(Src) 98.8 F (37.1 C) (Oral)  Resp 18  Ht 5\' 1"  (1.549 m)  Wt 148 lb (67.132 kg)  BMI 27.98 kg/m2  SpO2 88% Physical Exam  Nursing note and vitals reviewed. Constitutional: She is oriented to person, place, and time. She appears well-developed and well-nourished. No distress.  HENT:  Head: Normocephalic and atraumatic.  Eyes: EOM are normal. Pupils are equal, round, and reactive  to light.  Neck: Normal range of motion. Neck supple.  Cardiovascular: Normal rate and regular rhythm.  Exam reveals no friction rub.   No murmur heard. Pulmonary/Chest: Effort normal. No respiratory distress. She has decreased breath sounds (L sided). She has no wheezes. She has rhonchi (L sided). She has no rales.  Abdominal: Soft. She exhibits no distension. There is no  tenderness. There is no rebound.  Musculoskeletal: Normal range of motion. She exhibits no edema.  Neurological: She is alert and oriented to person, place, and time.  Skin: She is not diaphoretic.    ED Course  Procedures (including critical care time) Labs Review Labs Reviewed  CULTURE, BLOOD (ROUTINE X 2)  CULTURE, BLOOD (ROUTINE X 2)  CBC  BASIC METABOLIC PANEL  LACTIC ACID, PLASMA   Imaging Review Dg Chest 2 View  02/03/2013   CLINICAL DATA:  Pneumonia, short of breath  EXAM: CHEST  2 VIEW  COMPARISON:  06/05/2011  FINDINGS: Stable enlarged cardiac silhouette. There is bibasilar atelectasis. No effusion, infiltrate, or pneumothorax.  IMPRESSION: Cardiomegaly and mild atelectasis.   Electronically Signed   By: Genevive Bi M.D.   On: 02/03/2013 15:39   Ct Chest Wo Contrast  02/03/2013   CLINICAL DATA:  Pneumonia.  EXAM: CT CHEST WITHOUT CONTRAST  TECHNIQUE: Multidetector CT imaging of the chest was performed following the standard protocol without IV contrast.  COMPARISON:  Chest x-ray 02/03/2013.  FINDINGS: The chest wall is unremarkable.  The bony thorax is intact.  The heart is normal in size. No pericardial effusion. No mediastinal or hilar mass or adenopathy. Small scattered lymph nodes are noted. The esophagus is grossly normal. The aorta demonstrates scattered atherosclerotic calcifications but no focal aneurysm. Three-vessel coronary artery calcifications are noted. The pulmonary arteries are somewhat enlarged which may be due to pulmonary hypertension.  Examination of the lung parenchyma demonstrates peribronchial thickening and some streaky areas of subpleural atelectasis. Findings could be due to bronchitis or reactive airways disease. No focal airspace consolidation, pulmonary edema or pleural effusion. No bronchiectasis or interstitial lung disease.  The upper abdomen is unremarkable. Moderate atrophy of the pancreas is noted.  IMPRESSION: 1. All peribronchial thickening and  patchy ground-glass opacity and atelectasis suggesting bronchitis or reactive airways disease. No focal airspace consolidation to suggest pneumonia. 2. Scattered mediastinal and hilar lymph nodes but no mass or adenopathy. 3. Enlarged pulmonary artery suggesting pulmonary hypertension. 4. Aortic and coronary artery calcifications.   Electronically Signed   By: Loralie Champagne M.D.   On: 02/03/2013 17:39    MDM   1. Bronchitis with airway obstruction   2. Acute respiratory failure with hypoxia   3. Esophageal reflux   4. Fever   5. Hyponatremia   6. Leukocytosis   7. Obesity (BMI 30-39.9)   8. Other and unspecified hyperlipidemia   9. Sleep apnea   10. Type II or unspecified type diabetes mellitus without mention of complication, not stated as uncontrolled   11. Unspecified essential hypertension   12. Unspecified hypothyroidism    11F sent from Urgent Care for concerns of pneumonia. General malaise for week, fever and worsening productive cough today. No nausea or vomiting. Here low-grade fever, normal blood pressure, low O2 sats on room air - 92%.  L sided rhonchi, wheezes. Will get cultures, give breathing treatment, obtain basic labs and CXR, and start community acquired antibiotics. CXR here without PNA. Will get CT since outside CXR read as PNA.  No PNA on CT, however hypoxic here.  With bronchitis and hypoxia, admitted for continued antibiotics.   Dagmar Hait, MD 02/03/13 2340

## 2013-02-03 NOTE — ED Notes (Signed)
Attempted to call report to floor. RN will call back shortly to receive report.

## 2013-02-03 NOTE — ED Notes (Signed)
Patient transported to X-ray 

## 2013-02-03 NOTE — H&P (Signed)
[facility] History and Physical  Casey Harrell WUJ:[id number] DOB: April 27, 1935 DOA: 02/03/2013  Referring physician: Dr. Gwendolyn Grant PCP: [name], MD  Specialists: Dr. Allyson Sabal (cardiology)                       Dr. Craige Cotta (Pulmonologist)  Chief Complaint: Increase shortness of breath, hypoxia and productive cough.  HPI: Casey Harrell is a 78 y.o. female with past medical history significant for diabetes type 2 (on insulin), diabetic neuropathy, hypertension, asthma/COPD and GERD; came to ED complaining of increased shortness of breath and productive cough. Patient reports that she had been experiencing general malaise, worsening breathing and productive cough for about a week now; she endorses some subjective fevers at home and because her symptoms has continue worsening she decide to seek medical attention today. Patient went to urgent care given her health care concerns and was found to be hypoxic on room air (86-87%). There was also concerns given her symptoms and low-grade fever at the urgent care that the patient might have pneumonia; she was encouraged to go to the emergency department for further evaluation and treatment. In the ED a chest x-ray and a CT scan of her chest has demonstrated bronchitis and chronic changes consistent with COPD; no focal pneumonia was seen on these imaging studies, but the patient was found to be febrile, with wbc's of 19.5; hypoxic on room air and wheezing. [facility] has been called to admit the patient for further regimen treatment. Of note patient denies chest pain, palpitations, nausea/vomiting, abdominal pain, diarrhea, dysuria, melena, hematochezia or any other acute complaints.   Review of Systems:  Negative except as otherwise mentioned on history of present illness.  Past Medical History  Diagnosis Date  . DIABETES-TYPE 2 07/15/2008  . DIABETIC PERIPHERAL NEUROPATHY 09/02/2009  . HYPERTENSION 07/15/2008  . ASTHMA 07/15/2008  . GERD 07/15/2008   . PVD (peripheral vascular disease) 06/18/09    RCE  . Normal cardiac stress test     low risk Nuc 2009, Feb 2011  . COPD (chronic obstructive pulmonary disease)    Past Surgical History  Procedure Laterality Date  . Appendectomy  1971  . Abdominal hysterectomy  1971  . Tonsillectomy    . Tubal ligation    . Cesarean section    . Flexible sigmoidoscopy      diverticulitis  . Laparotomy      X 3 with adhesions lysis  . Foot surgery      right foot X 2  . Carotid endarterectomy  06/18/09    RCE   Social History:  reports that she has never smoked. She has never used smokeless tobacco. She reports that she does not drink alcohol. Her drug history is not on file.   Allergies  Allergen Reactions  . Doxycycline Hyclate     REACTION: vomiting  . Iohexol      Code: HIVES, Desc: pt. states she breaks out in hives 06/15/08   . Morphine Sulfate     REACTION: hallucinations  . Moxifloxacin     REACTION: unknown    Family History  Problem Relation Age of Onset  . Diabetes Mother   . Arthritis Mother   . Heart disease Father   . Celiac disease Sister   . Stroke Maternal Grandfather   . Cancer Paternal Grandfather     lung  . Heart disease Mother   . Heart disease Sister   . Heart disease Sister   . Asthma  Sister     Prior to Admission medications   Medication Sig Start Date End Date Taking? Authorizing Provider  albuterol (ACCUNEB) 1.25 MG/3ML nebulizer solution Take 1 ampule by nebulization every 6 (six) hours as needed for wheezing.   Yes Historical Provider, MD  amitriptyline (ELAVIL) 25 MG tablet Take 25 mg by mouth at bedtime.   Yes Historical Provider, MD  aspirin 81 MG tablet Take 81 mg by mouth daily.     Yes Historical Provider, MD  budesonide (PULMICORT) 0.25 MG/2ML nebulizer solution Take 0.25 mg by nebulization daily.   Yes Historical Provider, MD  Calcium Citrate (CITRACAL PO) Take 1 tablet by mouth 2 (two) times daily.    Yes Historical Provider, MD   carvedilol (COREG) 25 MG tablet Take 25 mg by mouth 2 (two) times daily with a meal.   Yes Historical Provider, MD  Cholecalciferol (VITAMIN D3) 2000 UNITS TABS Take 2,000 Units by mouth daily.    Yes Historical Provider, MD  dextromethorphan 15 MG/5ML syrup Take 5 mLs by mouth 3 (three) times daily as needed for cough.    Yes Historical Provider, MD  diltiazem (CARDIZEM CD) 120 MG 24 hr capsule Take 120 mg by mouth daily.  12/29/11  Yes Historical Provider, MD  DULoxetine (CYMBALTA) 60 MG capsule Take 60 mg by mouth daily.   Yes Historical Provider, MD  insulin aspart (NOVOLOG FLEXPEN) 100 UNIT/ML injection Inject 9-14 Units into the skin 2 (two) times daily at 8 am and 10 pm. 9 units in am and 14 units at bedtime 01/04/12  Yes [name], MD  insulin glargine (LANTUS) 100 UNIT/ML injection Inject 90 Units into the skin every morning.   Yes Historical Provider, MD  levothyroxine (SYNTHROID, LEVOTHROID) 50 MCG tablet Take 50 mcg by mouth daily before breakfast.   Yes Historical Provider, MD  magnesium oxide (MAG-OX) 400 MG tablet Take 400 mg by mouth daily.   Yes Historical Provider, MD  metFORMIN (GLUCOPHAGE) 1000 MG tablet Take 1,000 mg by mouth 2 (two) times daily with a meal.   Yes Historical Provider, MD  olmesartan (BENICAR) 20 MG tablet Take 20 mg by mouth daily.     Yes Historical Provider, MD  pantoprazole (PROTONIX) 40 MG tablet Take 40 mg by mouth daily.   Yes Historical Provider, MD  pregabalin (LYRICA) 150 MG capsule Take 150 mg by mouth daily.   Yes Historical Provider, MD  simvastatin (ZOCOR) 20 MG tablet Take 20 mg by mouth every evening.   Yes Historical Provider, MD  B-D ULTRAFINE III SHORT PEN 31G X 8 MM MISC USE AS DIRECTED 01/06/13   [name], MD  CLEVER CHEK AUTO-CODE VOICE test strip  05/07/11   Historical Provider, MD  Lancets MISC  05/07/11   Historical Provider, MD   Physical Exam: Filed Vitals:   02/03/13 1905  BP: 156/74  Pulse: 76  Temp: 98.1 F (36.7  C)  Resp: 20     General:  Warm and sweaty to touch;  unable to Harrell in full sentences and with intermittent coughing spells throughout examination.  patient was tachypneic on exam.  Eyes: PERRLA, extraocular muscles intact, no icterus  ENT: Mild dryness of her mucous membranes, no erythema or exudate inside her mouth, no thrush. No drainage out of her ears or nostrils  Neck: Supple, no thyromegaly, no bruits  Cardiovascular: S1 and S2, no rubs, no murmurs, regular rate and rhythm.  Respiratory: Decreased air movement especially at her bases, diffuse rhonchi  and mild wheezing.  Abdomen: Soft, nontender, nondistended, positive bowel sounds   Skin: no rash, no petechiae; no open wounds appreciated on exam.   Musculoskeletal: full range of motion, no edema, no cyanosis or clubbing; no joint swelling.   Psychiatric: no hallucination, mood stable.  Neurologic: cranial nerve intact, alert, awake and oriented x3, muscle strength 5 over 5 bilaterally and symmetrically; normal finger to nose.   Labs on Admission:  Basic Metabolic Panel:  Recent Labs Lab 02/03/13 1520  NA 132*  K 4.4  CL 95*  CO2 27  GLUCOSE 101*  BUN 14  CREATININE 0.81  CALCIUM 9.6   CBC:  Recent Labs Lab 02/03/13 1520  WBC 19.8*  HGB 13.2  HCT 40.4  MCV 86.0  PLT 278   CBG:  Recent Labs Lab 02/03/13 1810  GLUCAP 110*    Radiological Exams on Admission: Dg Chest 2 View  02/03/2013   CLINICAL DATA:  Pneumonia, short of breath  EXAM: CHEST  2 VIEW  COMPARISON:  06/05/2011  FINDINGS: Stable enlarged cardiac silhouette. There is bibasilar atelectasis. No effusion, infiltrate, or pneumothorax.  IMPRESSION: Cardiomegaly and mild atelectasis.   Electronically Signed   By: Genevive Bi M.D.   On: 02/03/2013 15:39   Ct Chest Wo Contrast  02/03/2013   CLINICAL DATA:  Pneumonia.  EXAM: CT CHEST WITHOUT CONTRAST  TECHNIQUE: Multidetector CT imaging of the chest was performed following the standard  protocol without IV contrast.  COMPARISON:  Chest x-ray 02/03/2013.  FINDINGS: The chest wall is unremarkable.  The bony thorax is intact.  The heart is normal in size. No pericardial effusion. No mediastinal or hilar mass or adenopathy. Small scattered lymph nodes are noted. The esophagus is grossly normal. The aorta demonstrates scattered atherosclerotic calcifications but no focal aneurysm. Three-vessel coronary artery calcifications are noted. The pulmonary arteries are somewhat enlarged which may be due to pulmonary hypertension.  Examination of the lung parenchyma demonstrates peribronchial thickening and some streaky areas of subpleural atelectasis. Findings could be due to bronchitis or reactive airways disease. No focal airspace consolidation, pulmonary edema or pleural effusion. No bronchiectasis or interstitial lung disease.  The upper abdomen is unremarkable. Moderate atrophy of the pancreas is noted.  IMPRESSION: 1. All peribronchial thickening and patchy ground-glass opacity and atelectasis suggesting bronchitis or reactive airways disease. No focal airspace consolidation to suggest pneumonia. 2. Scattered mediastinal and hilar lymph nodes but no mass or adenopathy. 3. Enlarged pulmonary artery suggesting pulmonary hypertension. 4. Aortic and coronary artery calcifications.   Electronically Signed   By: Loralie Champagne M.D.   On: 02/03/2013 17:39   Assessment/Plan 1-acute respiratory failure with hypoxia : Most likely secondary to Bronchitis with airway obstruction and exacerbation of her underlying asthma/COPD. -Patient will be admitted to regular bed -Will start treatment with Rocephin and Zithromax -Nebulizer treatment and Pulmicort twice a day -SoluMedrol -Continue oxygen supplementation -Flutter Valve -Supportive care and when necessary antitussives  -OHS and OSA might also be playing role in her SOB.  2-HYPOTHYROIDISM: Will check TSH. Continue Synthroid  3-DIABETES-TYPE 2: Patient  will be started on Cardura modify diet; continue Lantus (will only use 60 units for now and adjust further as needed), sliding scale insulin. Metformin will be on hold during hospitalization.  4-DIABETIC PERIPHERAL NEUROPATHY: Continue Cymbalta, Lyrica and amitriptyline.  5-DYSLIPIDEMIA: Will check lipid profile. Continue Zocor  6-HYPERTENSION: Fair control. Will continue home Regimen.  7-GERD: continue PPI  8-Obesity (BMI 30-39.9): low calorie diet has been discussed with patient.  9-Leukocytosis:secondary to #1. Will follow WBC's trend  10-Fever:secondary to #1 -PRN antipyretics -staretd on ABX's   11-Hyponatremia: due to mild dehydration. Will provide fluid resuscitation.  12-OSA: patient unable to tolerate CPAP.  DVT: Heparin  Code Status: DNR/DNI (ok with Bipap) Family Communication: Daughter at bedside Disposition Plan: LOS > 2 midnights; MED-Surg bed and inpatient  Time spent: 55 minutes  Randol Zumstein [facility] Pager [phone]  If 7PM-7AM, please contact night-coverage [email or web address] Password [facility] 02/03/2013, 7:35 PM

## 2013-02-03 NOTE — Progress Notes (Signed)
Utilization Review completed.  Daniah Zaldivar RN CM  

## 2013-02-03 NOTE — ED Notes (Addendum)
Pt diagnosed with pneumonia, this morning at an Urgent Care.  Sts she was sent here for medication and IV fluid.  Lab work shows WBC 18.0.  Hx asthma and COPD.

## 2013-02-04 DIAGNOSIS — E1149 Type 2 diabetes mellitus with other diabetic neurological complication: Secondary | ICD-10-CM

## 2013-02-04 LAB — LIPID PANEL
Cholesterol: 106 mg/dL (ref 0–200)
HDL: 26 mg/dL — ABNORMAL LOW (ref >39)
LDL Cholesterol: 64 mg/dL (ref 0–99)
Total CHOL/HDL Ratio: 4.1 RATIO
Triglycerides: 82 mg/dL (ref <150)
VLDL: 16 mg/dL (ref 0–40)

## 2013-02-04 LAB — HEMOGLOBIN A1C
Hgb A1c MFr Bld: 6.7 % — ABNORMAL HIGH (ref <5.7)
Mean Plasma Glucose: 146 mg/dL — ABNORMAL HIGH (ref <117)

## 2013-02-04 LAB — GLUCOSE, CAPILLARY
Glucose-Capillary: 318 mg/dL — ABNORMAL HIGH (ref 70–99)
Glucose-Capillary: 426 mg/dL — ABNORMAL HIGH (ref 70–99)
Glucose-Capillary: 347 mg/dL — ABNORMAL HIGH (ref 70–99)
Glucose-Capillary: 314 mg/dL — ABNORMAL HIGH (ref 70–99)

## 2013-02-04 LAB — CBC
HCT: 41.1 % (ref 36.0–46.0)
Hemoglobin: 13.8 g/dL (ref 12.0–15.0)
MCH: 28.6 pg (ref 26.0–34.0)
MCHC: 33.6 g/dL (ref 30.0–36.0)
MCV: 85.3 fL (ref 78.0–100.0)
Platelets: 312 10*3/uL (ref 150–400)
RBC: 4.82 MIL/uL (ref 3.87–5.11)
RDW: 14.4 % (ref 11.5–15.5)
WBC: 18 10*3/uL — ABNORMAL HIGH (ref 4.0–10.5)

## 2013-02-04 LAB — BASIC METABOLIC PANEL
BUN: 16 mg/dL (ref 6–23)
CO2: 23 mEq/L (ref 19–32)
Calcium: 9.5 mg/dL (ref 8.4–10.5)
Chloride: 96 mEq/L (ref 96–112)
Creatinine, Ser: 0.68 mg/dL (ref 0.50–1.10)
GFR calc Af Amer: >90 mL/min (ref >90)
GFR calc non Af Amer: 83 mL/min — ABNORMAL LOW (ref >90)
Glucose, Bld: 348 mg/dL — ABNORMAL HIGH (ref 70–99)
Potassium: 3.5 mEq/L (ref 3.5–5.1)
Sodium: 134 mEq/L — ABNORMAL LOW (ref 135–145)

## 2013-02-04 LAB — GLUCOSE, RANDOM: Glucose, Bld: 433 mg/dL — ABNORMAL HIGH (ref 70–99)

## 2013-02-04 LAB — TSH: TSH: 3.018 u[IU]/mL (ref 0.350–4.500)

## 2013-02-04 MED ORDER — METHYLPREDNISOLONE SODIUM SUCC 125 MG IJ SOLR
60.0000 mg | Freq: Two times a day (BID) | INTRAMUSCULAR | Status: DC
  Administered 2013-02-04 – 2013-02-05 (×2): 60 mg via INTRAVENOUS
  Filled 2013-02-04 (×4): qty 0.96

## 2013-02-04 MED ORDER — ASPIRIN 81 MG PO CHEW
81.0000 mg | CHEWABLE_TABLET | Freq: Every day | ORAL | Status: DC
  Administered 2013-02-04 – 2013-02-06 (×3): 81 mg via ORAL
  Filled 2013-02-04 (×2): qty 1

## 2013-02-04 MED ORDER — INSULIN ASPART 100 UNIT/ML ~~LOC~~ SOLN
0.0000 [IU] | Freq: Three times a day (TID) | SUBCUTANEOUS | Status: DC
  Administered 2013-02-04: 15 [IU] via SUBCUTANEOUS
  Administered 2013-02-05 (×2): 20 [IU] via SUBCUTANEOUS
  Administered 2013-02-05 – 2013-02-06 (×3): 11 [IU] via SUBCUTANEOUS

## 2013-02-04 MED ORDER — INSULIN ASPART 100 UNIT/ML ~~LOC~~ SOLN
22.0000 [IU] | Freq: Once | SUBCUTANEOUS | Status: AC
  Administered 2013-02-04: 22 [IU] via SUBCUTANEOUS

## 2013-02-04 MED ORDER — INSULIN GLARGINE 100 UNIT/ML ~~LOC~~ SOLN
70.0000 [IU] | Freq: Every morning | SUBCUTANEOUS | Status: DC
  Administered 2013-02-05 – 2013-02-06 (×2): 70 [IU] via SUBCUTANEOUS
  Filled 2013-02-04 (×2): qty 0.7

## 2013-02-04 MED ORDER — INSULIN ASPART 100 UNIT/ML ~~LOC~~ SOLN
0.0000 [IU] | Freq: Every day | SUBCUTANEOUS | Status: DC
  Administered 2013-02-04: 4 [IU] via SUBCUTANEOUS
  Administered 2013-02-05: 3 [IU] via SUBCUTANEOUS

## 2013-02-04 MED ORDER — ATORVASTATIN CALCIUM 10 MG PO TABS
10.0000 mg | ORAL_TABLET | Freq: Every day | ORAL | Status: DC
  Administered 2013-02-04 – 2013-02-05 (×2): 10 mg via ORAL
  Filled 2013-02-04 (×5): qty 1

## 2013-02-04 NOTE — Progress Notes (Addendum)
Inpatient Diabetes Program Recommendations  AACE/ADA: New Consensus Statement on Inpatient Glycemic Control (2013)  Target Ranges:  Prepandial:   less than 140 mg/dL      Peak postprandial:   less than 180 mg/dL (1-2 hours)      Critically ill patients:  140 - 180 mg/dL   Reason for Visit: Hyperglycemia and clarification of home insulin regimen  Results for [name], [name] (MRN [id number]) as of 02/04/2013 14:44  Ref. Range 02/03/2013 18:10 02/04/2013 07:54 02/04/2013 11:59  Glucose-Capillary Latest Range: 70-99 mg/dL 409 (H) 811 (H) 914 (H)   Note:  Since start of SoluMedrol, CBG's have gone up.  Had received Lantus 60 units and dosage increased to 70 units daily.  (Home dose is Lantus 90 units daily)  Note on Med Rec that patient takes Novolog 9 units every morning and 14 units at HS.  Patient states that she takes the morning dose before breakfast and the night dose after supper-- in between supper and bedtime.  States that she sometimes has symptoms of hypoglycemia in the mornings and at night before she goes to sleep.  Suggested she discuss these dosages and timing with Dr. Caryl Never.  Notified pharmacy and asked that timing of Novolog on Med Rec be changed to after supper instead of at HS.    Patient states that she would like to go somewhere to learn more about taking care of her diabetes and controlling her blood sugars better.  Wonders if there is anything she could be doing better at home.  Offered to refer [facility] to [facility] for diabetes education follow-up after discharge and she was agreeable.  She will be contacted by phone to set appt after discharge.  While in the hospital, especially while on steroids, recommend:  90 units of Lantus daily   Novolog meal coverage 6 units tid with meals in addition to correction scale  Thank you.  Casey Foss S. Casey Lincoln, RN, CNS, CDE Inpatient Diabetes Program, team pager [phone]  Addendum:  A1C is 6.7 with a mean glucose of  146.  This probably represents glycemic control that is too tight for her given multiple health problems.

## 2013-02-04 NOTE — Progress Notes (Signed)
[facility] PROGRESS NOTE  Casey Harrell YNW:[id number] DOB: [birth year] DOA: 02/03/2013  PCP: [name], MD  Brief HPI: Casey Harrell is a 78 y.o. female with past medical history significant for diabetes type 2 (on insulin), diabetic neuropathy, hypertension, asthma/COPD and GERD; came to ED complaining of increased shortness of breath and productive cough. She underwent CT chest and no pneumonia was noted. She was thought to have acute bronchitis and was admitted.  Past medical history:  Past Medical History  Diagnosis Date  . DIABETES-TYPE 2 07/15/2008  . DIABETIC PERIPHERAL NEUROPATHY 09/02/2009  . HYPERTENSION 07/15/2008  . ASTHMA 07/15/2008  . GERD 07/15/2008  . PVD (peripheral vascular disease) 06/18/09    RCE  . Normal cardiac stress test     low risk Nuc 2009, Feb 2011  . COPD (chronic obstructive pulmonary disease)     Consultants: None  Procedures: None  Antibiotics: Ceftriaxone Azithromycin  Subjective: Patient feels much better. Still with cough with yellow sputum. No chest pain. No nausea or vomiting.  Objective: Vital Signs  Filed Vitals:   02/04/13 0630 02/04/13 0750 02/04/13 0859 02/04/13 0936  BP: 141/81  150/102 138/65  Pulse: 98   81  Temp: 97.5 F (36.4 C)     TempSrc: Oral     Resp: 20     Height:      Weight:      SpO2: 92% 93%     Filed Weights   02/03/13 1444 02/03/13 2005  Weight: 67.132 kg (148 lb) 70.2 kg (154 lb 12.2 oz)    Intake/Output from previous day: 09/29 0701 - 09/30 0700 In: 776.3 [P.O.:360; I.V.:241.3; IV Piggyback:175] Out: 3 [Urine:3]  General appearance: alert, cooperative, appears stated age and no distress Resp: clear to auscultation bilaterally Cardio: regular rate and rhythm, S1, S2 normal, no murmur, click, rub or gallop GI: soft, non-tender; bowel sounds normal; no masses,  no organomegaly Extremities: extremities normal, atraumatic, no cyanosis or edema Neurologic: No focal deficits  Lab  Results:  Basic Metabolic Panel:  Recent Labs Lab 02/03/13 1520 02/04/13 0518  NA 132* 134*  K 4.4 3.5  CL 95* 96  CO2 27 23  GLUCOSE 101* 348*  BUN 14 16  CREATININE 0.81 0.68  CALCIUM 9.6 9.5   Liver Function Tests:  Recent Labs Lab 02/03/13 1520  AST 22  ALT 8  ALKPHOS 111  BILITOT 0.2*  PROT 8.4*  ALBUMIN 3.8   CBC:  Recent Labs Lab 02/03/13 1520 02/04/13 0518  WBC 19.8* 18.0*  HGB 13.2 13.8  HCT 40.4 41.1  MCV 86.0 85.3  PLT 278 312   CBG:  Recent Labs Lab 02/03/13 1810 02/04/13 0754  GLUCAP 110* 318*    No results found for this or any previous visit (from the past 240 hour(s)).    Studies/Results: Dg Chest 2 View  02/03/2013   CLINICAL DATA:  Pneumonia, short of breath  EXAM: CHEST  2 VIEW  COMPARISON:  06/05/2011  FINDINGS: Stable enlarged cardiac silhouette. There is bibasilar atelectasis. No effusion, infiltrate, or pneumothorax.  IMPRESSION: Cardiomegaly and mild atelectasis.   Electronically Signed   By: Genevive Bi M.D.   On: 02/03/2013 15:39   Ct Chest Wo Contrast  02/03/2013   CLINICAL DATA:  Pneumonia.  EXAM: CT CHEST WITHOUT CONTRAST  TECHNIQUE: Multidetector CT imaging of the chest was performed following the standard protocol without IV contrast.  COMPARISON:  Chest x-ray 02/03/2013.  FINDINGS: The chest wall is unremarkable.  The bony  thorax is intact.  The heart is normal in size. No pericardial effusion. No mediastinal or hilar mass or adenopathy. Small scattered lymph nodes are noted. The esophagus is grossly normal. The aorta demonstrates scattered atherosclerotic calcifications but no focal aneurysm. Three-vessel coronary artery calcifications are noted. The pulmonary arteries are somewhat enlarged which may be due to pulmonary hypertension.  Examination of the lung parenchyma demonstrates peribronchial thickening and some streaky areas of subpleural atelectasis. Findings could be due to bronchitis or reactive airways disease. No  focal airspace consolidation, pulmonary edema or pleural effusion. No bronchiectasis or interstitial lung disease.  The upper abdomen is unremarkable. Moderate atrophy of the pancreas is noted.  IMPRESSION: 1. All peribronchial thickening and patchy ground-glass opacity and atelectasis suggesting bronchitis or reactive airways disease. No focal airspace consolidation to suggest pneumonia. 2. Scattered mediastinal and hilar lymph nodes but no mass or adenopathy. 3. Enlarged pulmonary artery suggesting pulmonary hypertension. 4. Aortic and coronary artery calcifications.   Electronically Signed   By: Loralie Champagne M.D.   On: 02/03/2013 17:39    Medications:  Scheduled: . ipratropium  0.5 mg Nebulization TID   And  . albuterol  2.5 mg Nebulization TID  . amitriptyline  25 mg Oral QHS  . aspirin  81 mg Oral Daily  . atorvastatin  10 mg Oral q1800  . azithromycin  250 mg Intravenous Q24H  . budesonide  0.25 mg Nebulization BID  . carvedilol  25 mg Oral BID WC  . cefTRIAXone (ROCEPHIN)  IV  1 g Intravenous Q24H  . cholecalciferol  2,000 Units Oral Daily  . dextromethorphan-guaiFENesin  1 tablet Oral BID  . diltiazem  120 mg Oral Daily  . DULoxetine  60 mg Oral Daily  . heparin  5,000 Units Subcutaneous Q8H  . insulin aspart  0-15 Units Subcutaneous TID WC  . insulin glargine  60 Units Subcutaneous q morning - 10a  . irbesartan  150 mg Oral Daily  . levothyroxine  50 mcg Oral QAC breakfast  . magnesium oxide  400 mg Oral Daily  . methylPREDNISolone (SOLU-MEDROL) injection  60 mg Intravenous Q8H  . pantoprazole  40 mg Oral BID  . pregabalin  150 mg Oral Daily   Continuous:  ZOX:[id number], acetaminophen, albuterol, HYDROcodone-acetaminophen, HYDROmorphone (DILAUDID) injection, ondansetron (ZOFRAN) IV, ondansetron  Assessment/Plan:  Principal Problem:   Bronchitis with airway obstruction Active Problems:   HYPOTHYROIDISM   DIABETES-TYPE 2   DIABETIC PERIPHERAL NEUROPATHY    DYSLIPIDEMIA   HYPERTENSION   GERD   Obesity (BMI 30-39.9)   Leukocytosis   Fever   Acute respiratory failure with hypoxia   Depression   Hyponatremia    Acute respiratory failure with hypoxia/Acute Bronchitis Most likely secondary to Bronchitis with airway obstruction and exacerbation of her underlying asthma/COPD. Patient is better. Continue current treatment with steroids, antibiotics, nebs.   HYPOTHYROIDISM Continue Synthroid   DIABETES-TYPE 2 Continue Lantus. CBG high due to steroids. Change to resistant SSI. May need to increase Lantus dose as well back up to her home dose. Taper steroids as soon as able.   DIABETIC PERIPHERAL NEUROPATHY Continue Cymbalta, Lyrica and amitriptyline.   DYSLIPIDEMIA Continue statin. FLP is pending  HYPERTENSION BP was high earlier today, but better now. Monitor. Will continue home Regimen.   GERD Continue PPI   Obesity (BMI 30-39.9) Low calorie diet has been discussed with patient.   Mild Hyponatremia Due to mild dehydration. Improved with fluids.   OSA Patient unable to tolerate CPAP.   Code Status:  DNR DVT Prophylaxis:   Heparin Family Communication: Discussed with patient and her husband  Disposition Plan: Not ready for discharge.    LOS: 1 day   [facility]  [facility] Pager [phone] 02/04/2013, 11:24 AM  If 8PM-8AM, please contact night-coverage at [email or web address], password [facility]

## 2013-02-04 NOTE — Progress Notes (Signed)
Patient's CBG was 426. Dr. Rito Ehrlich notified new order received. Random glucose ordered per protocol.- Hulda Marin RN

## 2013-02-04 NOTE — Progress Notes (Signed)
Patient ambulated in the hallway,oxygen level was 90-92% on RA.- Hulda Marin RN

## 2013-02-05 LAB — CBC
HCT: 36.6 % (ref 36.0–46.0)
Hemoglobin: 12.1 g/dL (ref 12.0–15.0)
MCH: 28 pg (ref 26.0–34.0)
MCHC: 33.1 g/dL (ref 30.0–36.0)
MCV: 84.7 fL (ref 78.0–100.0)
Platelets: 316 10*3/uL (ref 150–400)
RBC: 4.32 MIL/uL (ref 3.87–5.11)
RDW: 14.2 % (ref 11.5–15.5)
WBC: 23.1 10*3/uL — ABNORMAL HIGH (ref 4.0–10.5)

## 2013-02-05 LAB — BASIC METABOLIC PANEL
BUN: 26 mg/dL — ABNORMAL HIGH (ref 6–23)
CO2: 23 mEq/L (ref 19–32)
Calcium: 9.7 mg/dL (ref 8.4–10.5)
Chloride: 95 mEq/L — ABNORMAL LOW (ref 96–112)
Creatinine, Ser: 0.81 mg/dL (ref 0.50–1.10)
GFR calc Af Amer: 80 mL/min — ABNORMAL LOW (ref >90)
GFR calc non Af Amer: 69 mL/min — ABNORMAL LOW (ref >90)
Glucose, Bld: 388 mg/dL — ABNORMAL HIGH (ref 70–99)
Potassium: 3.9 mEq/L (ref 3.5–5.1)
Sodium: 132 mEq/L — ABNORMAL LOW (ref 135–145)

## 2013-02-05 LAB — GLUCOSE, CAPILLARY
Glucose-Capillary: 412 mg/dL — ABNORMAL HIGH (ref 70–99)
Glucose-Capillary: 353 mg/dL — ABNORMAL HIGH (ref 70–99)
Glucose-Capillary: 300 mg/dL — ABNORMAL HIGH (ref 70–99)
Glucose-Capillary: 293 mg/dL — ABNORMAL HIGH (ref 70–99)

## 2013-02-05 LAB — GLUCOSE, RANDOM: Glucose, Bld: 402 mg/dL — ABNORMAL HIGH (ref 70–99)

## 2013-02-05 MED ORDER — MAGNESIUM HYDROXIDE 400 MG/5ML PO SUSP
30.0000 mL | Freq: Every evening | ORAL | Status: DC | PRN
  Administered 2013-02-05: 30 mL via ORAL
  Filled 2013-02-05: qty 30

## 2013-02-05 MED ORDER — INSULIN ASPART 100 UNIT/ML ~~LOC~~ SOLN
15.0000 [IU] | Freq: Once | SUBCUTANEOUS | Status: AC
  Administered 2013-02-05: 15 [IU] via SUBCUTANEOUS

## 2013-02-05 MED ORDER — HYDROCOD POLST-CHLORPHEN POLST 10-8 MG/5ML PO LQCR
5.0000 mL | Freq: Once | ORAL | Status: AC
  Administered 2013-02-05: 5 mL via ORAL
  Filled 2013-02-05: qty 5

## 2013-02-05 MED ORDER — AZITHROMYCIN 500 MG PO TABS
500.0000 mg | ORAL_TABLET | Freq: Every day | ORAL | Status: DC
  Administered 2013-02-05 – 2013-02-06 (×2): 500 mg via ORAL
  Filled 2013-02-05 (×2): qty 1

## 2013-02-05 MED ORDER — PREDNISONE 50 MG PO TABS
60.0000 mg | ORAL_TABLET | Freq: Every day | ORAL | Status: DC
  Administered 2013-02-05 – 2013-02-06 (×2): 60 mg via ORAL
  Filled 2013-02-05 (×3): qty 1

## 2013-02-05 MED ORDER — FLUTICASONE PROPIONATE 50 MCG/ACT NA SUSP
1.0000 | Freq: Every day | NASAL | Status: DC
  Administered 2013-02-05 – 2013-02-06 (×2): 1 via NASAL
  Filled 2013-02-05: qty 16

## 2013-02-05 MED ORDER — INSULIN GLARGINE 100 UNIT/ML ~~LOC~~ SOLN
20.0000 [IU] | SUBCUTANEOUS | Status: AC
  Administered 2013-02-05: 20 [IU] via SUBCUTANEOUS
  Filled 2013-02-05: qty 0.2

## 2013-02-05 NOTE — Progress Notes (Signed)
[facility] PROGRESS NOTE  GOWRI SUCHAN ZOX:[id number] DOB: March 17, 1935 DOA: 02/03/2013  PCP: [name], MD  Brief HPI: Casey Harrell is a 78 y.o. female with past medical history significant for diabetes type 2 (on insulin), diabetic neuropathy, hypertension, asthma/COPD and GERD; came to ED complaining of increased shortness of breath and productive cough. She underwent CT chest and no pneumonia was noted. She was thought to have acute bronchitis and was admitted.  Past medical history:  Past Medical History  Diagnosis Date  . DIABETES-TYPE 2 07/15/2008  . DIABETIC PERIPHERAL NEUROPATHY 09/02/2009  . HYPERTENSION 07/15/2008  . ASTHMA 07/15/2008  . GERD 07/15/2008  . PVD (peripheral vascular disease) 06/18/09    RCE  . Normal cardiac stress test     low risk Nuc 2009, Feb 2011  . COPD (chronic obstructive pulmonary disease)     Consultants: None  Procedures: None  Antibiotics: Ceftriaxone Azithromycin  Subjective: Patient feels only fair.  deneis CP/N/V/SOB Had very poor sleep last night with coughing and ensuant headache Overall appears better   Objective: Vital Signs  Filed Vitals:   02/04/13 1954 02/04/13 2100 02/05/13 0518 02/05/13 0749  BP:  158/71 157/66   Pulse:  83 79   Temp:  97.1 F (36.2 C) 98.6 F (37 C)   TempSrc:  Oral Oral   Resp:  20 20   Height:      Weight:      SpO2: 90% 94% 91% 93%   Filed Weights   02/03/13 1444 02/03/13 2005  Weight: 67.132 kg (148 lb) 70.2 kg (154 lb 12.2 oz)    Intake/Output from previous day: 09/30 0701 - 10/01 0700 In: 1375 [P.O.:1200; IV Piggyback:175] Out: -   General appearance: alert, cooperative, appears stated age and no distress ENT-R ear has erythema, some swelling of upper TM, ? Small perforation Resp: clear to auscultation bilaterally Cardio: regular rate and rhythm, S1, S2 normal, no murmur, click, rub or gallop GI: soft, non-tender; bowel sounds normal; no masses,  no  organomegaly Extremities: extremities normal, atraumatic, no cyanosis or edema Neurologic: No focal deficits  Lab Results:  Basic Metabolic Panel:  Recent Labs Lab 02/03/13 1520 02/04/13 0518 02/04/13 1225 02/05/13 0510 02/05/13 0905  NA 132* 134*  --  132*  --   K 4.4 3.5  --  3.9  --   CL 95* 96  --  95*  --   CO2 27 23  --  23  --   GLUCOSE 101* 348* 433* 388* 402*  BUN 14 16  --  26*  --   CREATININE 0.81 0.68  --  0.81  --   CALCIUM 9.6 9.5  --  9.7  --    Liver Function Tests:  Recent Labs Lab 02/03/13 1520  AST 22  ALT 8  ALKPHOS 111  BILITOT 0.2*  PROT 8.4*  ALBUMIN 3.8   CBC:  Recent Labs Lab 02/03/13 1520 02/04/13 0518 02/05/13 0510  WBC 19.8* 18.0* 23.1*  HGB 13.2 13.8 12.1  HCT 40.4 41.1 36.6  MCV 86.0 85.3 84.7  PLT 278 312 316   CBG:  Recent Labs Lab 02/04/13 0754 02/04/13 1159 02/04/13 1640 02/04/13 2118 02/05/13 0738  GLUCAP 318* 426* 347* 314* 412*    Recent Results (from the past 240 hour(s))  CULTURE, BLOOD (ROUTINE X 2)     Status: None   Collection Time    02/03/13  3:20 PM      Result Value Range Status  Specimen Description BLOOD RIGHT ANTECUBITAL   Final   Special Requests Normal BOTTLES DRAWN AEROBIC AND ANAEROBIC 4CC   Final   Culture  Setup Time     Final   Value: 02/04/2013 00:18     Performed at [facility]   Culture     Final   Value:        BLOOD CULTURE RECEIVED NO GROWTH TO DATE CULTURE WILL BE HELD FOR 5 DAYS BEFORE ISSUING A FINAL NEGATIVE REPORT     Performed at [facility]   Report Status PENDING   Incomplete  CULTURE, BLOOD (ROUTINE X 2)     Status: None   Collection Time    02/03/13  4:00 PM      Result Value Range Status   Specimen Description BLOOD RIGHT ARM   Final   Special Requests BOTTLES DRAWN AEROBIC AND ANAEROBIC 10CC   Final   Culture  Setup Time     Final   Value: 02/04/2013 00:18     Performed at [facility]   Culture     Final   Value:        BLOOD  CULTURE RECEIVED NO GROWTH TO DATE CULTURE WILL BE HELD FOR 5 DAYS BEFORE ISSUING A FINAL NEGATIVE REPORT     Performed at [facility]   Report Status PENDING   Incomplete      Studies/Results: Dg Chest 2 View  02/03/2013   CLINICAL DATA:  Pneumonia, short of breath  EXAM: CHEST  2 VIEW  COMPARISON:  06/05/2011  FINDINGS: Stable enlarged cardiac silhouette. There is bibasilar atelectasis. No effusion, infiltrate, or pneumothorax.  IMPRESSION: Cardiomegaly and mild atelectasis.   Electronically Signed   By: Genevive Bi M.D.   On: 02/03/2013 15:39   Ct Chest Wo Contrast  02/03/2013   CLINICAL DATA:  Pneumonia.  EXAM: CT CHEST WITHOUT CONTRAST  TECHNIQUE: Multidetector CT imaging of the chest was performed following the standard protocol without IV contrast.  COMPARISON:  Chest x-ray 02/03/2013.  FINDINGS: The chest wall is unremarkable.  The bony thorax is intact.  The heart is normal in size. No pericardial effusion. No mediastinal or hilar mass or adenopathy. Small scattered lymph nodes are noted. The esophagus is grossly normal. The aorta demonstrates scattered atherosclerotic calcifications but no focal aneurysm. Three-vessel coronary artery calcifications are noted. The pulmonary arteries are somewhat enlarged which may be due to pulmonary hypertension.  Examination of the lung parenchyma demonstrates peribronchial thickening and some streaky areas of subpleural atelectasis. Findings could be due to bronchitis or reactive airways disease. No focal airspace consolidation, pulmonary edema or pleural effusion. No bronchiectasis or interstitial lung disease.  The upper abdomen is unremarkable. Moderate atrophy of the pancreas is noted.  IMPRESSION: 1. All peribronchial thickening and patchy ground-glass opacity and atelectasis suggesting bronchitis or reactive airways disease. No focal airspace consolidation to suggest pneumonia. 2. Scattered mediastinal and hilar lymph nodes but no mass or  adenopathy. 3. Enlarged pulmonary artery suggesting pulmonary hypertension. 4. Aortic and coronary artery calcifications.   Electronically Signed   By: Loralie Champagne M.D.   On: 02/03/2013 17:39    Medications:  Scheduled: . ipratropium  0.5 mg Nebulization TID   And  . albuterol  2.5 mg Nebulization TID  . amitriptyline  25 mg Oral QHS  . aspirin  81 mg Oral Daily  . atorvastatin  10 mg Oral q1800  . azithromycin  500 mg Oral Daily  . budesonide  0.25 mg Nebulization BID  . carvedilol  25 mg Oral BID WC  . cholecalciferol  2,000 Units Oral Daily  . dextromethorphan-guaiFENesin  1 tablet Oral BID  . diltiazem  120 mg Oral Daily  . DULoxetine  60 mg Oral Daily  . heparin  5,000 Units Subcutaneous Q8H  . insulin aspart  0-20 Units Subcutaneous TID WC  . insulin aspart  0-5 Units Subcutaneous QHS  . insulin glargine  20 Units Subcutaneous NOW  . insulin glargine  70 Units Subcutaneous q morning - 10a  . irbesartan  150 mg Oral Daily  . levothyroxine  50 mcg Oral QAC breakfast  . magnesium oxide  400 mg Oral Daily  . pantoprazole  40 mg Oral BID  . predniSONE  60 mg Oral QAC breakfast  . pregabalin  150 mg Oral Daily   Continuous:  ZOX:[id number], acetaminophen, albuterol, HYDROcodone-acetaminophen, HYDROmorphone (DILAUDID) injection, ondansetron (ZOFRAN) IV, ondansetron  Assessment/Plan:  Principal Problem:   Bronchitis with airway obstruction Active Problems:   HYPOTHYROIDISM   DIABETES-TYPE 2   DIABETIC PERIPHERAL NEUROPATHY   DYSLIPIDEMIA   HYPERTENSION   GERD   Obesity (BMI 30-39.9)   Leukocytosis   Fever   Acute respiratory failure with hypoxia   Depression   Hyponatremia    Acute respiratory failure with hypoxia/Acute Bronchitis Most likely secondary to Bronchitis with airway obstruction and exacerbation of her underlying asthma/COPD. Patient is better, ?10/1 have  steroids to PO prednisone 60,  IV Abx to Azithromycin, stop date  10/5  ?Rhinosinusitis-reports 10/1 h/o ear pain.  Ear exam as above.  Has sinus pressure.  Added flonase 1 gtt bid.  Abx should cover this.  Feels better form this standpoint and headache likely related to this  HYPOTHYROIDISM Continue Synthroid   DIABETES-TYPE 2, A1c 6.7 Hard to control given IV steroids-Pre-morbid A1C noted Have adjusted insulin today-will follow trend and hope for better control in the next day  DIABETIC PERIPHERAL NEUROPATHY Continue Cymbalta, Lyrica and amitriptyline.   DYSLIPIDEMIA Continue statin. FLP shows HDL 26  HYPERTENSION BP was high earlier today, but better now. Monitor. Will continue home Regimen.   GERD Continue PPI   Obesity (BMI 30-39.9) Low calorie diet has been discussed with patient.   Mild Hyponatremia Due to mild dehydration. Improved with fluids.   OSA Patient unable to tolerate CPAP.   Code Status:  DNR DVT Prophylaxis:   Heparin Family Communication: Discussed with patient and her husband  Disposition Plan: Not ready for discharge-hyperglycemic, might need insulin teaching???    LOS: 2 days   Rhetta Mura  [facility] Pager [phone] 02/05/2013, 10:22 AM  If 8PM-8AM, please contact night-coverage at [email or web address], password [facility]

## 2013-02-05 NOTE — Progress Notes (Addendum)
Inpatient Diabetes Program Recommendations  AACE/ADA: New Consensus Statement on Inpatient Glycemic Control (2013)  Target Ranges:  Prepandial:   less than 140 mg/dL      Peak postprandial:   less than 180 mg/dL (1-2 hours)      Critically ill patients:  140 - 180 mg/dL    Results for TEMECA, SOMMA (MRN [id number]) as of 02/05/2013 13:36  Ref. Range 02/03/2013 15:20  Hemoglobin A1C Latest Range: <5.7 % 6.7 (H)    **Decent CBG control at home as evidenced by A1c of 6.7% (02/03/13)  **Per records, patient takes the following insulin at home: Lantus 90 units daily in the AM Novolog 9 units before breakfast and 14 units after supper   **Noted Dr. Mahala Menghini gave patient her whole home dose of Lantus today.  Hopeful now that IV steroids have been stopped that CBG control will improve quickly.   Will follow. Ambrose Finland RN, MSN, CDE Diabetes Coordinator Inpatient Diabetes Program Team Pager: [phone] (8a-10p)

## 2013-02-05 NOTE — Progress Notes (Signed)
Patient had CBG 412.  Dr. Mahala Menghini called with orders received.  Random glucose checked per protocol.  Philomena Doheny RN

## 2013-02-06 LAB — GLUCOSE, CAPILLARY
Glucose-Capillary: 265 mg/dL — ABNORMAL HIGH (ref 70–99)
Glucose-Capillary: 291 mg/dL — ABNORMAL HIGH (ref 70–99)

## 2013-02-06 MED ORDER — AZITHROMYCIN 500 MG PO TABS: 500.0000 mg | ORAL_TABLET | Freq: Every day | ORAL | Status: DC

## 2013-02-06 MED ORDER — DM-GUAIFENESIN ER 30-600 MG PO TB12: 1.0000 | ORAL_TABLET | Freq: Two times a day (BID) | ORAL | Status: DC

## 2013-02-06 MED ORDER — FLUTICASONE PROPIONATE 50 MCG/ACT NA SUSP: 1.0000 | Freq: Every day | NASAL | Status: DC

## 2013-02-06 MED ORDER — SODIUM CHLORIDE 0.9 % IJ SOLN: 3.0000 mL | Freq: Two times a day (BID) | INTRAMUSCULAR | Status: DC

## 2013-02-06 MED ORDER — PREDNISONE 20 MG PO TABS: ORAL_TABLET | ORAL | Status: DC

## 2013-02-06 NOTE — Progress Notes (Signed)
Inpatient Diabetes Program Recommendations  AACE/ADA: New Consensus Statement on Inpatient Glycemic Control (2013)  Target Ranges:  Prepandial:   less than 140 mg/dL      Peak postprandial:   less than 180 mg/dL (1-2 hours)      Critically ill patients:  140 - 180 mg/dL     Results for DEARDRA, HINKLEY (MRN [id number]) as of 02/06/2013 09:55  Ref. Range 02/05/2013 07:38 02/05/2013 10:54 02/05/2013 16:21 02/05/2013 21:07  Glucose-Capillary Latest Range: 70-99 mg/dL 409 (H) 811 (H) 914 (H) 293 (H)    Results for NESSIE, NONG (MRN [id number]) as of 02/06/2013 09:55  Ref. Range 02/06/2013 07:21  Glucose-Capillary Latest Range: 70-99 mg/dL 086 (H)    **MD- Please consider the following in-hospital insulin adjustments:  1. Increase Lantus to home dose of 90 units daily (patient received 90 units total Lantus yesterday [10/01]) 2. Consider adding scheduled Novolog meal coverage- Novolog 4 units tid with meals   Will follow. Ambrose Finland RN, MSN, CDE Diabetes Coordinator Inpatient Diabetes Program Team Pager: [phone] (8a-10p)

## 2013-02-06 NOTE — Progress Notes (Signed)
Discharge instructions given to pt, verbalized understanding. Left the unit in stable condition. 

## 2013-02-06 NOTE — Discharge Summary (Signed)
Physician Discharge Summary  Casey Harrell:[id number] DOB: 1935/02/02 DOA: 02/03/2013  PCP: [name], MD  Admit date: 02/03/2013 Discharge date: 02/06/2013  Time spent: 40 minutes  Recommendations for Outpatient Follow-up:  1. Needs tapering dosage of prednisone as per below  2. Consider non-emergent ENT followup vs. PCP to reinspect her ear is as an outpatient 3. Continue home dosage of Lantus, metformin, insulin 4. Patient recommended to keep a log of blood sugars given she is on steroids 5. Flonase prescribed her in addition to them he snacks this admission  Discharge Diagnoses:  Principal Problem:   Bronchitis with airway obstruction Active Problems:   HYPOTHYROIDISM   DIABETES-TYPE 2   DIABETIC PERIPHERAL NEUROPATHY   DYSLIPIDEMIA   HYPERTENSION   GERD   Obesity (BMI 30-39.9)   Leukocytosis   Fever   Acute respiratory failure with hypoxia   Depression   Hyponatremia   Discharge Condition: Good  Diet recommendation: DiabeticKay F Harrell is a 78 y.o. female with past medical history significant for diabetes type 2 (on insulin), diabetic neuropathy, hypertension, asthma/COPD and GERD; came to ED complaining of increased shortness of breath and productive cough. She underwent CT chest and no pneumonia was noted. She was thought to have acute bronchitis and was admitted.   Filed Weights   02/03/13 1444 02/03/13 2005  Weight: 67.132 kg (148 lb) 70.2 kg (154 lb 12.2 oz)    History of present illness:   Acute respiratory failure with hypoxia/Acute Bronchitis  Most likely secondary to Bronchitis with airway obstruction and exacerbation of her underlying asthma/COPD. Patient is better, ?10/1 have steroids to PO prednisone 60--tapered to the above as an outpatient for 22 more tablets, IV Abx to Azithromycin, stop date 10/314 completing 5 day history  ?Rhinosinusitis-reports 10/1 h/o ear pain. Ear exam as above. Has sinus pressure. Added flonase 1 gtt bid-cut relief from  this. Abx should cover this. Feels better form this standpoint and headache likely related to this  HYPOTHYROIDISM  Continue Synthroid  DIABETES-TYPE 2, A1c 6.7  Hard to control given IV steroids-Pre-morbid A1C noted  Have adjusted insulin today-will follow trend and hope for better control in the next day-we'll continue premorbid Lantus 90 units on discharge in addition to 9 units of insulin before breakfast and 14 around supper DIABETIC PERIPHERAL NEUROPATHY  Continue Cymbalta, Lyrica and amitriptyline.  DYSLIPIDEMIA  Continue statin. FLP shows HDL 26  HYPERTENSION  BP was high earlier today, but better now. Monitor. Will continue home Regimen.  GERD  Continue PPI  Obesity (BMI 30-39.9)  Low calorie diet has been discussed with patient.  Mild Hyponatremia  Due to mild dehydration. Improved with fluids.    Consultants: None  Procedures: None  Antibiotics:  Ceftriaxone  Azithromycin   Discharge Exam: Filed Vitals:   02/06/13 0504  BP: 149/64  Pulse: 74  Temp: 98.3 F (36.8 C)  Resp: 18   Alert pleasant oriented Feels much better Slept well and is less tired  General: EOMI Cardiovascular: S1-S2 no murmur rub or gallop Respiratory: Clinically clear  Discharge Instructions  Discharge Orders   Future Orders Complete By Expires   Ambulatory referral to Nutrition and Diabetic Education  As directed    Comments:     Patient wants to improve blood sugar control at home.  Wants to know if there is anything she can do differently to improve control.  Knows to expect telephone call after discharge to set appt.  Not receiving Home Health visits before admission.  If  Home Health is ordered at discharge, will need to wait until no longer being followed.  Thank you.   Diet - low sodium heart healthy  As directed    Discharge instructions  As directed    Comments:     Please see regular physician for further management of diabetes in about a week and keep a log of blood  sugars Please continue the steroid dosages   Increase activity slowly  As directed        Medication List         albuterol 1.25 MG/3ML nebulizer solution  Commonly known as:  ACCUNEB  Take 1 ampule by nebulization every 6 (six) hours as needed for wheezing.     amitriptyline 25 MG tablet  Commonly known as:  ELAVIL  Take 25 mg by mouth at bedtime.     aspirin 81 MG tablet  Take 81 mg by mouth daily.     azithromycin 500 MG tablet  Commonly known as:  ZITHROMAX  Take 1 tablet (500 mg total) by mouth daily.     B-D ULTRAFINE III SHORT PEN 31G X 8 MM Misc  Generic drug:  Insulin Pen Needle  USE AS DIRECTED     budesonide 0.25 MG/2ML nebulizer solution  Commonly known as:  PULMICORT  Take 0.25 mg by nebulization daily.     carvedilol 25 MG tablet  Commonly known as:  COREG  Take 25 mg by mouth 2 (two) times daily with a meal.     CITRACAL PO  Take 1 tablet by mouth 2 (two) times daily.     CLEVER CHEK AUTO-CODE VOICE test strip  Generic drug:  glucose blood     dextromethorphan 15 MG/5ML syrup  Take 5 mLs by mouth 3 (three) times daily as needed for cough.     dextromethorphan-guaiFENesin 30-600 MG per 12 hr tablet  Commonly known as:  MUCINEX DM  Take 1 tablet by mouth 2 (two) times daily.     diltiazem 120 MG 24 hr capsule  Commonly known as:  CARDIZEM CD  Take 120 mg by mouth daily.     DULoxetine 60 MG capsule  Commonly known as:  CYMBALTA  Take 60 mg by mouth daily.     fluticasone 50 MCG/ACT nasal spray  Commonly known as:  FLONASE  Place 1 spray into the nose daily.     insulin glargine 100 UNIT/ML injection  Commonly known as:  LANTUS  Inject 90 Units into the skin every morning.     Lancets Misc     levothyroxine 50 MCG tablet  Commonly known as:  SYNTHROID, LEVOTHROID  Take 50 mcg by mouth daily before breakfast.     magnesium oxide 400 MG tablet  Commonly known as:  MAG-OX  Take 400 mg by mouth daily.     metFORMIN 1000 MG tablet   Commonly known as:  GLUCOPHAGE  Take 1,000 mg by mouth 2 (two) times daily with a meal.     NOVOLOG FLEXPEN [place]  Inject 9-14 Units into the skin 2 (two) times daily with a meal. 9 units *Before* Breakfast and 14 units *After* Supper     olmesartan 20 MG tablet  Commonly known as:  BENICAR  Take 20 mg by mouth daily.     pantoprazole 40 MG tablet  Commonly known as:  PROTONIX  Take 40 mg by mouth daily.     predniSONE 20 MG tablet  Commonly known as:  DELTASONE  take 3 tablet  daily for 4 days Then 2 tablet daily for 4 days then 1 tablet for 2 days, then stop     pregabalin 150 MG capsule  Commonly known as:  LYRICA  Take 150 mg by mouth daily.     simvastatin 20 MG tablet  Commonly known as:  ZOCOR  Take 20 mg by mouth every evening.     Vitamin D3 2000 UNITS Tabs  Take 2,000 Units by mouth daily.       Allergies  Allergen Reactions  . Doxycycline Hyclate Nausea And Vomiting    REACTION: vomiting  . Iohexol      Code: HIVES, Desc: pt. states she breaks out in hives 06/15/08   . Morphine Sulfate     REACTION: hallucinations  . Moxifloxacin     REACTION: unknown      The results of significant diagnostics from this hospitalization (including imaging, microbiology, ancillary and laboratory) are listed below for reference.    Significant Diagnostic Studies: Dg Chest 2 View  02/03/2013   CLINICAL DATA:  Pneumonia, short of breath  EXAM: CHEST  2 VIEW  COMPARISON:  06/05/2011  FINDINGS: Stable enlarged cardiac silhouette. There is bibasilar atelectasis. No effusion, infiltrate, or pneumothorax.  IMPRESSION: Cardiomegaly and mild atelectasis.   Electronically Signed   By: Genevive Bi M.D.   On: 02/03/2013 15:39   Ct Chest Wo Contrast  02/03/2013   CLINICAL DATA:  Pneumonia.  EXAM: CT CHEST WITHOUT CONTRAST  TECHNIQUE: Multidetector CT imaging of the chest was performed following the standard protocol without IV contrast.  COMPARISON:  Chest x-ray 02/03/2013.   FINDINGS: The chest wall is unremarkable.  The bony thorax is intact.  The heart is normal in size. No pericardial effusion. No mediastinal or hilar mass or adenopathy. Small scattered lymph nodes are noted. The esophagus is grossly normal. The aorta demonstrates scattered atherosclerotic calcifications but no focal aneurysm. Three-vessel coronary artery calcifications are noted. The pulmonary arteries are somewhat enlarged which may be due to pulmonary hypertension.  Examination of the lung parenchyma demonstrates peribronchial thickening and some streaky areas of subpleural atelectasis. Findings could be due to bronchitis or reactive airways disease. No focal airspace consolidation, pulmonary edema or pleural effusion. No bronchiectasis or interstitial lung disease.  The upper abdomen is unremarkable. Moderate atrophy of the pancreas is noted.  IMPRESSION: 1. All peribronchial thickening and patchy ground-glass opacity and atelectasis suggesting bronchitis or reactive airways disease. No focal airspace consolidation to suggest pneumonia. 2. Scattered mediastinal and hilar lymph nodes but no mass or adenopathy. 3. Enlarged pulmonary artery suggesting pulmonary hypertension. 4. Aortic and coronary artery calcifications.   Electronically Signed   By: Loralie Champagne M.D.   On: 02/03/2013 17:39    Microbiology: Recent Results (from the past 240 hour(s))  CULTURE, BLOOD (ROUTINE X 2)     Status: None   Collection Time    02/03/13  3:20 PM      Result Value Range Status   Specimen Description BLOOD RIGHT ANTECUBITAL   Final   Special Requests Normal BOTTLES DRAWN AEROBIC AND ANAEROBIC 4CC   Final   Culture  Setup Time     Final   Value: 02/04/2013 00:18     Performed at [facility]   Culture     Final   Value:        BLOOD CULTURE RECEIVED NO GROWTH TO DATE CULTURE WILL BE HELD FOR 5 DAYS BEFORE ISSUING A FINAL NEGATIVE REPORT     Performed  at [facility]   Report Status PENDING    Incomplete  CULTURE, BLOOD (ROUTINE X 2)     Status: None   Collection Time    02/03/13  4:00 PM      Result Value Range Status   Specimen Description BLOOD RIGHT ARM   Final   Special Requests BOTTLES DRAWN AEROBIC AND ANAEROBIC 10CC   Final   Culture  Setup Time     Final   Value: 02/04/2013 00:18     Performed at [facility]   Culture     Final   Value:        BLOOD CULTURE RECEIVED NO GROWTH TO DATE CULTURE WILL BE HELD FOR 5 DAYS BEFORE ISSUING A FINAL NEGATIVE REPORT     Performed at [facility]   Report Status PENDING   Incomplete     Labs: Basic Metabolic Panel:  Recent Labs Lab 02/03/13 1520 02/04/13 0518 02/04/13 1225 02/05/13 0510 02/05/13 0905  NA 132* 134*  --  132*  --   K 4.4 3.5  --  3.9  --   CL 95* 96  --  95*  --   CO2 27 23  --  23  --   GLUCOSE 101* 348* 433* 388* 402*  BUN 14 16  --  26*  --   CREATININE 0.81 0.68  --  0.81  --   CALCIUM 9.6 9.5  --  9.7  --    Liver Function Tests:  Recent Labs Lab 02/03/13 1520  AST 22  ALT 8  ALKPHOS 111  BILITOT 0.2*  PROT 8.4*  ALBUMIN 3.8   No results found for this basename: LIPASE, AMYLASE,  in the last 168 hours No results found for this basename: AMMONIA,  in the last 168 hours CBC:  Recent Labs Lab 02/03/13 1520 02/04/13 0518 02/05/13 0510  WBC 19.8* 18.0* 23.1*  HGB 13.2 13.8 12.1  HCT 40.4 41.1 36.6  MCV 86.0 85.3 84.7  PLT 278 312 316   Cardiac Enzymes: No results found for this basename: CKTOTAL, CKMB, CKMBINDEX, TROPONINI,  in the last 168 hours BNP: BNP (last 3 results) No results found for this basename: PROBNP,  in the last 8760 hours CBG:  Recent Labs Lab 02/05/13 1054 02/05/13 1621 02/05/13 2107 02/06/13 0721 02/06/13 1107  GLUCAP 353* 300* 293* 265* 291*       Signed:  Rhetta Mura  [facility] 02/06/2013, 2:00 PM

## 2013-02-10 LAB — CULTURE, BLOOD (ROUTINE X 2)
Culture: NO GROWTH
Culture: NO GROWTH
Special Requests: NORMAL

## 2013-02-12 ENCOUNTER — Encounter: Payer: Self-pay | Admitting: Family Medicine

## 2013-02-12 ENCOUNTER — Ambulatory Visit (INDEPENDENT_AMBULATORY_CARE_PROVIDER_SITE_OTHER): Payer: Medicare Other | Admitting: Family Medicine

## 2013-02-12 VITALS — BP 136/62 | HR 76 | Temp 97.9°F | Wt 153.0 lb

## 2013-02-12 DIAGNOSIS — J9600 Acute respiratory failure, unspecified whether with hypoxia or hypercapnia: Secondary | ICD-10-CM

## 2013-02-12 DIAGNOSIS — E119 Type 2 diabetes mellitus without complications: Secondary | ICD-10-CM

## 2013-02-12 DIAGNOSIS — J9601 Acute respiratory failure with hypoxia: Secondary | ICD-10-CM

## 2013-02-12 MED ORDER — BUDESONIDE 0.25 MG/2ML IN SUSP: 0.2500 mg | Freq: Two times a day (BID) | RESPIRATORY_TRACT | Status: DC

## 2013-02-12 MED ORDER — AMITRIPTYLINE HCL 25 MG PO TABS: 25.0000 mg | ORAL_TABLET | Freq: Every day | ORAL | Status: DC

## 2013-02-12 NOTE — Progress Notes (Signed)
Subjective:    Patient ID: Casey Harrell, female    DOB: 1934/07/26, 78 y.o.   MRN: [id number]  HPI Hospital followup. Patient has history of multiple chronic problems including asthma. She developed some cough and dyspnea and [facility] She was told she might have pneumonia and referred to urgent care. She had x-ray at urgent care and was told she had pneumonia it was subsequently referred to emergency department for further evaluation. Chest x-ray and CT of chest in hospital did not confirm any pneumonia. She was felt to have acute bronchitis with reactive airway component and was admitted. She was treated with antibiotics and prednisone and is finishing prednisone taper this time. She still has occasional cough but is feeling closer to baseline. She uses budesonide nebulizer outpatient along with beta agonist. She has had flu vaccine and recent Pneumovax.  She has type 2 diabetes and blood sugars have ,as expected, increased on prednisone. She had fasting blood sugar this morning 114. She remains on Lantus 90 units once daily and NovoLog at meals. She's not had any recent hypoglycemic symptoms. Recent labs from hospital reviewed with A1C 6.7% which is slightly better than she has been in quite some time.  Past Medical History  Diagnosis Date  . DIABETES-TYPE 2 07/15/2008  . DIABETIC PERIPHERAL NEUROPATHY 09/02/2009  . HYPERTENSION 07/15/2008  . ASTHMA 07/15/2008  . GERD 07/15/2008  . PVD (peripheral vascular disease) 06/18/09    RCE  . Normal cardiac stress test     low risk Nuc 2009, Feb 2011  . COPD (chronic obstructive pulmonary disease)    Past Surgical History  Procedure Laterality Date  . Appendectomy  1971  . Abdominal hysterectomy  1971  . Tonsillectomy    . Tubal ligation    . Cesarean section    . Flexible sigmoidoscopy      diverticulitis  . Laparotomy      X 3 with adhesions lysis  . Foot surgery      right foot X 2  . Carotid  endarterectomy  06/18/09    RCE    reports that she has never smoked. She has never used smokeless tobacco. She reports that she does not drink alcohol. Her drug history is not on file. family history includes Arthritis in her mother; Asthma in her sister; Cancer in her paternal grandfather; Celiac disease in her sister; Diabetes in her mother; Heart disease in her father, mother, sister, and sister; Stroke in her maternal grandfather. Allergies  Allergen Reactions  . Doxycycline Hyclate Nausea And Vomiting    REACTION: vomiting  . Iohexol      Code: HIVES, Desc: pt. states she breaks out in hives 06/15/08   . Morphine Sulfate     REACTION: hallucinations  . Moxifloxacin     REACTION: unknown      Review of Systems  Constitutional: Negative for fever and chills.  HENT: Negative for ear discharge and ear pain.   Respiratory: Positive for cough. Negative for shortness of breath and wheezing.   Cardiovascular: Negative for chest pain, palpitations and leg swelling.  Gastrointestinal: Negative for abdominal pain.  Endocrine: Negative for polydipsia and polyuria.  Genitourinary: Negative for dysuria.  Neurological: Negative for dizziness and syncope.       Objective:   Physical Exam  Constitutional: She appears well-developed and well-nourished.  HENT:  Right Ear: External ear normal.  Left Ear: External ear normal.  Mouth/Throat: Oropharynx is clear and moist.  Neck:  Neck supple.  Cardiovascular: Normal rate and regular rhythm.   Pulmonary/Chest: Effort normal and breath sounds normal. No respiratory distress. She has no wheezes. She has no rales.  Musculoskeletal: She exhibits no edema.          Assessment & Plan:  #1 recent respiratory illness. She was diagnosed with acute bronchitis. She's finished antibiotics and is finishing prednisone taper. She is stable and near baseline today with pulse oximetry 97% #2 type 2 diabetes with recent good control. Exacerbation as  expected with prednisone. Continue close monitoring. Continue current insulin regimen

## 2013-02-25 ENCOUNTER — Ambulatory Visit: Payer: Medicare Other | Admitting: *Deleted

## 2013-03-02 ENCOUNTER — Other Ambulatory Visit: Payer: Self-pay | Admitting: Family Medicine

## 2013-03-13 ENCOUNTER — Other Ambulatory Visit: Payer: Self-pay | Admitting: Family Medicine

## 2013-04-27 ENCOUNTER — Other Ambulatory Visit: Payer: Self-pay | Admitting: Family Medicine

## 2013-06-01 ENCOUNTER — Other Ambulatory Visit: Payer: Self-pay | Admitting: Cardiovascular Disease

## 2013-06-02 NOTE — Telephone Encounter (Signed)
Rx was sent to pharmacy electronically. 

## 2013-06-04 ENCOUNTER — Encounter: Payer: Self-pay | Admitting: Family Medicine

## 2013-06-04 ENCOUNTER — Ambulatory Visit (INDEPENDENT_AMBULATORY_CARE_PROVIDER_SITE_OTHER): Payer: Medicare Other | Admitting: Family Medicine

## 2013-06-04 VITALS — BP 132/68 | HR 60 | Temp 97.5°F | Ht 60.0 in | Wt 159.0 lb

## 2013-06-04 DIAGNOSIS — E1149 Type 2 diabetes mellitus with other diabetic neurological complication: Secondary | ICD-10-CM

## 2013-06-04 DIAGNOSIS — Z0000 Encounter for general adult medical examination without abnormal findings: Secondary | ICD-10-CM

## 2013-06-04 DIAGNOSIS — Z23 Encounter for immunization: Secondary | ICD-10-CM

## 2013-06-04 DIAGNOSIS — K219 Gastro-esophageal reflux disease without esophagitis: Secondary | ICD-10-CM

## 2013-06-04 DIAGNOSIS — E785 Hyperlipidemia, unspecified: Secondary | ICD-10-CM

## 2013-06-04 DIAGNOSIS — I10 Essential (primary) hypertension: Secondary | ICD-10-CM

## 2013-06-04 DIAGNOSIS — E039 Hypothyroidism, unspecified: Secondary | ICD-10-CM

## 2013-06-04 DIAGNOSIS — E119 Type 2 diabetes mellitus without complications: Secondary | ICD-10-CM

## 2013-06-04 LAB — HEMOGLOBIN A1C: Hgb A1c MFr Bld: 7.7 % — ABNORMAL HIGH (ref 4.6–6.5)

## 2013-06-04 MED ORDER — INSULIN LISPRO 100 UNIT/ML (KWIKPEN): PEN_INJECTOR | SUBCUTANEOUS | Status: DC

## 2013-06-04 NOTE — Patient Instructions (Signed)
Schedule repeat mammogram. Consider repeat DEXA scan by next year. Continue regular calcium and vitamin D supplementation Continue yearly flu vaccine. Will consider Prevnar 13 pneumonia vaccine by next year

## 2013-06-04 NOTE — Progress Notes (Signed)
Subjective:    Patient ID: Casey Harrell, female    DOB: 05/08/1935, 79 y.o.   MRN: [id number]  HPI Patient is seen for Medicare wellness exam and medical followup She has many chronic problems including history of obstructive sleep apnea, asthma, hypothyroidism, hypertension, GERD, type 2 diabetes with peripheral neuropathy, dyslipidemia. Immunizations are up-to-date with the exception of tetanus. She has never smoked. Last mammogram over one year ago. Blood sugars been stable. Medications reviewed. Compliant with all. She is requesting a change from NovoLog Humalog insulin because of insurance issues. No recent falls.  No chest pain.  No dizziness.    Past Medical History  Diagnosis Date  . DIABETES-TYPE 2 07/15/2008  . DIABETIC PERIPHERAL NEUROPATHY 09/02/2009  . HYPERTENSION 07/15/2008  . ASTHMA 07/15/2008  . GERD 07/15/2008  . PVD (peripheral vascular disease) 06/18/09    RCE  . Normal cardiac stress test     low risk Nuc 2009, Feb 2011  . COPD (chronic obstructive pulmonary disease)    Past Surgical History  Procedure Laterality Date  . Appendectomy  1971  . Abdominal hysterectomy  1971  . Tonsillectomy    . Tubal ligation    . Cesarean section    . Flexible sigmoidoscopy      diverticulitis  . Laparotomy      X 3 with adhesions lysis  . Foot surgery      right foot X 2  . Carotid endarterectomy  06/18/09    RCE    reports that she has never smoked. She has never used smokeless tobacco. She reports that she does not drink alcohol. Her drug history is not on file. family history includes Arthritis in her mother; Asthma in her sister; Cancer in her paternal grandfather; Cancer (age of onset: 2060) in her brother; Celiac disease in her sister; Diabetes in her mother; Heart disease in her brother, father, mother, sister, and sister; Stroke in her maternal grandfather. Allergies  Allergen Reactions  . Doxycycline Hyclate Nausea And Vomiting    REACTION: vomiting  . Iohexol      Code: HIVES, Desc: pt. states she breaks out in hives 06/15/08   . Morphine Sulfate     REACTION: hallucinations  . Moxifloxacin     REACTION: unknown   1.  Risk factors based on Past Medical , Social, and Family history reviewed as above 2.  Limitations in physical activities no recent falls 3.  Depression/mood no depression or anxiety issues 4.  Hearing no major changes 5.  ADLs independent in all 6.  Cognitive function (orientation to time and place, language, writing, speech,memory) memory intact. Language and judgment intact 7.  Home Safety no issues 8.  Height, weight, and visual acuity. All stable 9.  Counseling discussed adequate calcium and vitamin D. Discussed fall prevention. 10. Recommendation of preventive services. Tetanus booster given. Repeat mammogram. DEXA scan the next year. 11. Labs based on risk factors hemoglobin A1c 12. Care Plan as above    Review of Systems  Constitutional: Negative for fever, activity change, appetite change, fatigue and unexpected weight change.  HENT: Negative for ear pain, hearing loss, sore throat and trouble swallowing.   Eyes: Negative for visual disturbance.  Respiratory: Negative for cough and shortness of breath.   Cardiovascular: Negative for chest pain and palpitations.  Gastrointestinal: Negative for abdominal pain, diarrhea, constipation and blood in stool.  Endocrine: Negative for polydipsia and polyuria.  Genitourinary: Negative for dysuria and hematuria.  Musculoskeletal: Negative for arthralgias, back pain  and myalgias.  Skin: Negative for rash.  Neurological: Negative for dizziness, syncope and headaches.  Hematological: Negative for adenopathy.  Psychiatric/Behavioral: Negative for confusion and dysphoric mood.       Objective:   Physical Exam  Constitutional: She is oriented to person, place, and time. She appears well-developed and well-nourished.  HENT:  Head: Normocephalic and atraumatic.  Eyes: EOM are  normal. Pupils are equal, round, and reactive to light.  Neck: Normal range of motion. Neck supple. No thyromegaly present.  Cardiovascular: Normal rate, regular rhythm and normal heart sounds.   No murmur heard. Pulmonary/Chest: Breath sounds normal. No respiratory distress. She has no wheezes. She has no rales.  Abdominal: Soft. Bowel sounds are normal. She exhibits no distension and no mass. There is no tenderness. There is no rebound and no guarding.  Genitourinary:  Breasts symmetric with no mass  Musculoskeletal: Normal range of motion. She exhibits no edema.  Lymphadenopathy:    She has no cervical adenopathy.  Neurological: She is alert and oriented to person, place, and time. She displays normal reflexes. No cranial nerve deficit.  Skin: No rash noted.  Psychiatric: She has a normal mood and affect. Her behavior is normal. Judgment and thought content normal.          Assessment & Plan:  Complete physical. Tetanus booster given. Schedule repeat mammography. Discussed repeat DEXA scan. Last 2 years ago. She wishes to wait until next year. Discussed adequate calcium and vitamin D consumption. Consider Prevnar 13 by next year  Type 2 diabetes. Change NovoLog Humalog for insurance reasons. Repeat A1c Dyslipidemia. Lipids reviewed from September in stable. Continue simvastatin Hypertension which is stable at goal GERD which is stable and controlled with proton pump inhibitor

## 2013-06-04 NOTE — Progress Notes (Signed)
Pre visit review using our clinic review tool, if applicable. No additional management support is needed unless otherwise documented below in the visit note. 

## 2013-06-06 ENCOUNTER — Telehealth: Payer: Self-pay

## 2013-06-06 ENCOUNTER — Other Ambulatory Visit: Payer: Self-pay | Admitting: Family Medicine

## 2013-06-06 ENCOUNTER — Other Ambulatory Visit: Payer: Self-pay | Admitting: Cardiovascular Disease

## 2013-06-06 NOTE — Telephone Encounter (Signed)
Relevant patient education mailed to patient.  

## 2013-06-09 ENCOUNTER — Telehealth: Payer: Self-pay | Admitting: Family Medicine

## 2013-06-09 NOTE — Telephone Encounter (Signed)
Relevant patient education mailed to patient.  

## 2013-06-09 NOTE — Telephone Encounter (Signed)
pantoprazole (PROTONIX) 40 MG tablet Approved for one year starting Jun 06, 2013  a letter will be sent and pt has been notified

## 2013-06-15 LAB — HM DIABETES EYE EXAM

## 2013-06-20 ENCOUNTER — Encounter: Payer: Self-pay | Admitting: Family Medicine

## 2013-07-05 ENCOUNTER — Other Ambulatory Visit: Payer: Self-pay | Admitting: Family Medicine

## 2013-07-07 ENCOUNTER — Other Ambulatory Visit: Payer: Self-pay | Admitting: Cardiovascular Disease

## 2013-07-07 NOTE — Telephone Encounter (Signed)
E sentrx 

## 2013-07-09 ENCOUNTER — Ambulatory Visit (INDEPENDENT_AMBULATORY_CARE_PROVIDER_SITE_OTHER): Payer: Medicare Other | Admitting: Cardiovascular Disease

## 2013-07-09 ENCOUNTER — Encounter: Payer: Self-pay | Admitting: Cardiovascular Disease

## 2013-07-09 VITALS — BP 138/60 | HR 67 | Ht 61.0 in | Wt 158.0 lb

## 2013-07-09 DIAGNOSIS — E785 Hyperlipidemia, unspecified: Secondary | ICD-10-CM

## 2013-07-09 DIAGNOSIS — I10 Essential (primary) hypertension: Secondary | ICD-10-CM

## 2013-07-09 DIAGNOSIS — I739 Peripheral vascular disease, unspecified: Secondary | ICD-10-CM

## 2013-07-09 NOTE — Patient Instructions (Signed)
Your physician recommends that you schedule a follow-up appointment in: 1 year  

## 2013-07-09 NOTE — Assessment & Plan Note (Signed)
Status post right carotid endarterectomy 06/18/09. Her last carotid Doppler study performed in our office 01/26/12 revealed widely patent carotids. There are no bruits on auscultation. She is neurologically stable.

## 2013-07-09 NOTE — Assessment & Plan Note (Signed)
On statin therapy followed by her PCP 

## 2013-07-09 NOTE — Assessment & Plan Note (Signed)
Well-controlled on current medications 

## 2013-07-09 NOTE — Progress Notes (Signed)
07/09/2013 Casey Harrell   02-21-1935  [id number]  Primary Physician Casey Covey, MD Primary Cardiologist: Casey Gess MD Casey Harrell   HPI:  The patient is a 79 year old, mildly overweight, married Caucasian female, mother of 2, grandmother to 4 grandchildren who I last saw 6 months ago. Her problems include hypertension, hyperlipidemia and noninsulin-requiring diabetes. She had a negative Myoview February 2011. Carotid Dopplers May 26, 2009, showed high-grade right ICA stenosis. I referred her to Dr. Durene Cal who performed an uncomplicated right carotid endarterectomy. Followup Dopplers performed in our office showed this to be widely patent. Her lipid profile followed by her primary care physician. I saw her a year ago she crit was stable. She did see Casey Kilroy PA-C in the office 12/13/12.   Current Outpatient Prescriptions  Medication Sig Dispense Refill  . albuterol (ACCUNEB) 1.25 MG/3ML nebulizer solution Take 1 ampule by nebulization every 6 (six) hours as needed for wheezing.      Marland Kitchen amitriptyline (ELAVIL) 25 MG tablet Take 1 tablet (25 mg total) by mouth at bedtime.  90 tablet  3  . aspirin 81 MG tablet Take 81 mg by mouth daily.        . B-D ULTRAFINE III SHORT PEN 31G X 8 MM MISC USE AS DIRECTED  100 each  3  . budesonide (PULMICORT) 0.25 MG/2ML nebulizer solution Take 2 mLs (0.25 mg total) by nebulization 2 (two) times daily.  120 mL  11  . Calcium Citrate (CITRACAL PO) Take 1 tablet by mouth 2 (two) times daily.       . carvedilol (COREG) 25 MG tablet TAKE 1 TABLET BY MOUTH TWICE A DAY WITH A MEAL  60 tablet  6  . Cholecalciferol (VITAMIN D3) 2000 UNITS TABS Take 2,000 Units by mouth daily.       Marland Kitchen CLEVER CHEK AUTO-CODE VOICE test strip       . dextromethorphan 15 MG/5ML syrup Take 5 mLs by mouth 3 (three) times daily as needed for cough.       . dextromethorphan-guaiFENesin (MUCINEX DM) 30-600 MG per 12 hr tablet Take 1 tablet by mouth 2 (two)  times daily.  30 tablet  0  . diltiazem (CARDIZEM CD) 120 MG 24 hr capsule Take 120 mg by mouth daily.       . DULoxetine (CYMBALTA) 60 MG capsule Take 60 mg by mouth daily.      . fluticasone (FLONASE) 50 MCG/ACT nasal spray Place 1 spray into the nose daily.  16 g  2  . Insulin Aspart (NOVOLOG FLEXPEN [place]) Inject 9-14 Units into the skin 2 (two) times daily with a meal. 9 units *Before* Breakfast and 14 units *After* Supper      . insulin lispro (HUMALOG KWIKPEN) 100 UNIT/ML KiwkPen Use 1 to 9 units 3 times daily. DX: 250.01  15 mL  5  . Lancets MISC       . levothyroxine (SYNTHROID, LEVOTHROID) 50 MCG tablet Take 50 mcg by mouth daily before breakfast.      . magnesium oxide (MAG-OX) 400 (241.3 MG) MG tablet TAKE 1 TABLET BY MOUTH DAILY  30 tablet  3  . magnesium oxide (MAG-OX) 400 MG tablet Take 400 mg by mouth daily.      . metFORMIN (GLUCOPHAGE) 1000 MG tablet Take 1,000 mg by mouth 2 (two) times daily with a meal.      . NOVOLOG FLEXPEN 100 UNIT/ML SOPN FlexPen USE 1 TO 9 UNITS EVERY 4  HOURS  15 pen  5  . olmesartan (BENICAR) 20 MG tablet Take 20 mg by mouth daily.        . pantoprazole (PROTONIX) 40 MG tablet Take 40 mg by mouth daily.      . pantoprazole (PROTONIX) 40 MG tablet TAKE 1 TABLET BY MOUTH TWICE DAILY  60 tablet  3  . pregabalin (LYRICA) 150 MG capsule Take 150 mg by mouth daily.      . simvastatin (ZOCOR) 20 MG tablet Take 20 mg by mouth every evening.      Marland Kitchen azithromycin (ZITHROMAX) 500 MG tablet Take 1 tablet (500 mg total) by mouth daily.  1 tablet  0  . DULoxetine (CYMBALTA) 60 MG capsule TAKE 1 CAPSULE BY MOUTH DAILY  30 capsule  11  . insulin glargine (LANTUS) 100 UNIT/ML injection Inject 90 Units into the skin every morning.      . predniSONE (DELTASONE) 20 MG tablet take 3 tablet daily for 4 days Then 2 tablet daily for 4 days then 1 tablet for 2 days, then stop  22 tablet  0   No current facility-administered medications for this visit.    Allergies  Allergen  Reactions  . Doxycycline Hyclate Nausea And Vomiting    REACTION: vomiting  . Iohexol      Code: HIVES, Desc: pt. states she breaks out in hives 06/15/08   . Morphine Sulfate     REACTION: hallucinations  . Moxifloxacin     REACTION: unknown    History   Social History  . Marital Status: Married    Spouse Name: N/A    Number of Children: Y  . Years of Education: N/A   Occupational History  . stay at home    Social History Main Topics  . Smoking status: Never Smoker   . Smokeless tobacco: Never Used  . Alcohol Use: No  . Drug Use: Not on file  . Sexual Activity: Not on file   Other Topics Concern  . Not on file   Social History Narrative  . No narrative on file     Review of Systems: General: negative for chills, fever, night sweats or weight changes.  Cardiovascular: negative for chest pain, dyspnea on exertion, edema, orthopnea, palpitations, paroxysmal nocturnal dyspnea or shortness of breath Dermatological: negative for rash Respiratory: negative for cough or wheezing Urologic: negative for hematuria Abdominal: negative for nausea, vomiting, diarrhea, bright red blood per rectum, melena, or hematemesis Neurologic: negative for visual changes, syncope, or dizziness All other systems reviewed and are otherwise negative except as noted above.    Blood pressure 138/60, pulse 67, height 5\' 1"  (1.549 m), weight 71.668 kg (158 lb).  General appearance: alert and no distress Neck: no adenopathy, no carotid bruit, no JVD, supple, symmetrical, trachea midline and thyroid not enlarged, symmetric, no tenderness/mass/nodules Lungs: clear to auscultation bilaterally Heart: regular rate and rhythm, S1, S2 normal, no murmur, click, rub or gallop Extremities: extremities normal, atraumatic, no cyanosis or edema  EKG normal sinus rhythm 57 without ST or T wave changes  ASSESSMENT AND PLAN:   HYPERTENSION Well-controlled on current medications  DYSLIPIDEMIA On statin  therapy followed by her PCP  PVD (peripheral vascular disease)- S/P RCE 06/18/09 Status post right carotid endarterectomy 06/18/09. Her last carotid Doppler study performed in our office 01/26/12 revealed widely patent carotids. There are no bruits on auscultation. She is neurologically stable.      Casey Gess MD FACP,FACC,FAHA, [facility] 07/09/2013 4:43 PM

## 2013-07-17 ENCOUNTER — Other Ambulatory Visit: Payer: Self-pay | Admitting: Family Medicine

## 2013-07-22 ENCOUNTER — Telehealth: Payer: Self-pay | Admitting: Family Medicine

## 2013-07-22 NOTE — Telephone Encounter (Signed)
Patient Information:  Caller Name: Joyce GrossKay  Phone: [phone]  Patient: Casey Harrell, Casey Harrell  Gender: Female  DOB: 06-22-1934  Age: 79 Years  PCP: Evelena PeatBurchette, Bruce (Family Practice)  Office Follow Up:  Does the office need to follow up with this patient?: Yes  Instructions For The Office: Please review Elevated blood sugar and contact patient .  RN Note:  Please review Elevated blood sugar and contact patient .  Symptoms  Reason For Call & Symptoms: Patient is diabetic and takes Insulin.  She takes Humalog 10 units bid, in the morning before breakfast and dinner.  Novlog 9 units in the morning and 14 Units in the evening.  Metformin 1000mg   in the morning and at night.   She states this morning before breakfast  GBS #404 .  she took her medication as scheduled.  It is now #219.Marland Kitchen.  She has been compliant with medications and diet.  She reports meter is accurate.  Reviewed Health History In EMR: Yes  Reviewed Medications In EMR: Yes  Reviewed Allergies In EMR: Yes  Reviewed Surgeries / Procedures: Yes  Date of Onset of Symptoms: 07/15/2013  Guideline(s) Used:  Diabetes - High Blood Sugar  Disposition Per Guideline:   Discuss with PCP and Callback by Nurse within 1 Hour  Reason For Disposition Reached:   Blood glucose > 400 mg/dl (22 mmol/l)  Advice Given:  Treatment - Liquids  Drink at least one glass (8 oz or 240 ml) of water per hour for the next 4 hours. (Reason: adequate hydration will reduce hyperglycemia).  Generally, you should try to drink 6-8 glasses of water each day.  Treatment - Insulin  Continue to take your insulin, as prescribed by your doctor.  Treatment - Diabetes Medications  : Continue taking your diabetes pills.  Measure and Record Your Blood Glucose  Every day you should measure your blood glucose before breakfast and before going to bed.  Record the results and show them to your doctor at your next office visit.  Call Back If:  Blood glucose more than 300 mg/dL  (14.716.5 mmol/l), 2 or more times in a row.  Vomiting lasting more than 4 hours or unable to drink any liquids.  Rapid breathing occurs  You become worse.  RN Overrode Recommendation:  Document Patient  Please review Elevated blood sugar and contact patient .

## 2013-07-23 NOTE — Telephone Encounter (Signed)
This was yesterday. Pt stated that she is feeling okay overall, she is just concerned about her blood sugars. This morning 3/18 her blood sugar was 56.

## 2013-07-23 NOTE — Telephone Encounter (Signed)
She should NOT be taking both Novolog and Humalog.  These are both short acting.   Verify which she is taking   She should be on Lantus plus ONE of the above.

## 2013-07-23 NOTE — Telephone Encounter (Signed)
Pt aware that Levemir is ready for pick. Per Dr. Caryl NeverBurchette use 20 units if greater than 130 titrate up every 2 days.

## 2013-08-02 ENCOUNTER — Other Ambulatory Visit: Payer: Self-pay | Admitting: Family Medicine

## 2013-08-04 NOTE — Telephone Encounter (Signed)
Refill both for 6 months. 

## 2013-08-04 NOTE — Telephone Encounter (Signed)
Last refill 01/24/13 #30 5 refills Last visit 06/04/13

## 2013-08-06 ENCOUNTER — Other Ambulatory Visit: Payer: Self-pay | Admitting: Family Medicine

## 2013-08-06 ENCOUNTER — Telehealth: Payer: Self-pay | Admitting: Family Medicine

## 2013-08-06 MED ORDER — OLMESARTAN MEDOXOMIL 20 MG PO TABS: 20.0000 mg | ORAL_TABLET | Freq: Every day | ORAL | Status: DC

## 2013-08-06 NOTE — Telephone Encounter (Signed)
RX sent to pharmacy  

## 2013-08-06 NOTE — Telephone Encounter (Signed)
Pt needs re-fill of olmesartan (BENICAR) 20 MG tablet [facility] [place]. She will be completely out by Friday 08/08/13

## 2013-08-07 ENCOUNTER — Other Ambulatory Visit: Payer: Self-pay

## 2013-08-07 MED ORDER — CARVEDILOL 25 MG PO TABS: 25.0000 mg | ORAL_TABLET | Freq: Two times a day (BID) | ORAL | Status: DC

## 2013-08-07 NOTE — Telephone Encounter (Signed)
Rx was sent to pharmacy electronically. 

## 2013-08-28 ENCOUNTER — Ambulatory Visit (INDEPENDENT_AMBULATORY_CARE_PROVIDER_SITE_OTHER): Payer: Medicare Other | Admitting: Family Medicine

## 2013-08-28 ENCOUNTER — Other Ambulatory Visit: Payer: Self-pay

## 2013-08-28 ENCOUNTER — Encounter: Payer: Self-pay | Admitting: Family Medicine

## 2013-08-28 VITALS — BP 128/66 | HR 71 | Temp 97.5°F | Wt 155.0 lb

## 2013-08-28 DIAGNOSIS — I10 Essential (primary) hypertension: Secondary | ICD-10-CM

## 2013-08-28 DIAGNOSIS — E119 Type 2 diabetes mellitus without complications: Secondary | ICD-10-CM

## 2013-08-28 LAB — HM DIABETES FOOT EXAM

## 2013-08-28 LAB — HEMOGLOBIN A1C: Hgb A1c MFr Bld: 8 % — ABNORMAL HIGH (ref 4.6–6.5)

## 2013-08-28 NOTE — Patient Instructions (Signed)
Titrate Levemir insulin up 2 units every 3 days until fasting sugars are consistently < 130. Consider checking some sugars pre-lunch or supper Consider adding Novolog insulin to lunch meal.

## 2013-08-28 NOTE — Progress Notes (Signed)
Pre visit review using our clinic review tool, if applicable. No additional management support is needed unless otherwise documented below in the visit note. 

## 2013-08-28 NOTE — Progress Notes (Signed)
Subjective:    Patient ID: Casey Harrell, female    DOB: [birth year], 79 y.o.   MRN: [id number]  Diabetes Pertinent negatives for hypoglycemia include no dizziness, headaches or seizures. Pertinent negatives for diabetes include no chest pain, no fatigue and no weakness.   Followup type 2 diabetes. Patient recently went on Levemir of insurance reasons. She is taking 35 units once daily. Fasting blood sugars fairly consistently over 200. Last A1c 7.7%. Fasting this morning 166. She also takes metformin and takes NovoLog with breakfast and supper. She is not checking sugars very often either prenatal or postnatal other than breakfast. She has lost 4 pounds since last visit  Her other chronic problems include history of asthma, peripheral neuropathy, hypertension, hyperlipidemia, hypothyroidism. Her asthma has been stable. Blood pressures been very well controlled. No recent chest pains.  Past Medical History  Diagnosis Date  . DIABETES-TYPE 2 07/15/2008  . DIABETIC PERIPHERAL NEUROPATHY 09/02/2009  . HYPERTENSION 07/15/2008  . ASTHMA 07/15/2008  . GERD 07/15/2008  . PVD (peripheral vascular disease) 06/18/09    RCE  . Normal cardiac stress test     low risk Nuc 2009, Feb 2011  . COPD (chronic obstructive pulmonary disease)    Past Surgical History  Procedure Laterality Date  . Appendectomy  1971  . Abdominal hysterectomy  1971  . Tonsillectomy    . Tubal ligation    . Cesarean section    . Flexible sigmoidoscopy      diverticulitis  . Laparotomy      X 3 with adhesions lysis  . Foot surgery      right foot X 2  . Carotid endarterectomy  06/18/09    RCE    reports that she has never smoked. She has never used smokeless tobacco. She reports that she does not drink alcohol. Her drug history is not on file. family history includes Arthritis in her mother; Asthma in her sister; Cancer in her paternal grandfather; Cancer (age of onset: 5060) in her brother; Celiac disease in her sister;  Diabetes in her mother; Heart disease in her brother, father, mother, sister, and sister; Stroke in her maternal grandfather. Allergies  Allergen Reactions  . Doxycycline Hyclate Nausea And Vomiting    REACTION: vomiting  . Iohexol      Code: HIVES, Desc: pt. states she breaks out in hives 06/15/08   . Morphine Sulfate     REACTION: hallucinations  . Moxifloxacin     REACTION: unknown      Review of Systems  Constitutional: Negative for appetite change, fatigue and unexpected weight change.  Eyes: Negative for visual disturbance.  Respiratory: Negative for cough, chest tightness, shortness of breath and wheezing.   Cardiovascular: Negative for chest pain, palpitations and leg swelling.  Neurological: Negative for dizziness, seizures, syncope, weakness, light-headedness and headaches.       Objective:   Physical Exam  Constitutional: She appears well-developed and well-nourished.  Cardiovascular: Normal rate and regular rhythm.   Pulmonary/Chest: Effort normal and breath sounds normal. No respiratory distress. She has no wheezes. She has no rales.  Musculoskeletal: She exhibits no edema.          Assessment & Plan:  #1 type 2 diabetes. She has had fair control until recently. Recheck A1c. Titrate Levemir or up 2 units every 3 days and the fastings around 130 consistently. Consider addition of NovoLog at lunchtime meal. We've recommended she check premeal and postprandial blood sugars more often. Reassess 3 months #2 hypertension  which is well-controlled at goal.

## 2013-09-01 ENCOUNTER — Other Ambulatory Visit: Payer: Self-pay

## 2013-09-01 MED ORDER — DILTIAZEM HCL ER COATED BEADS 120 MG PO CP24: 120.0000 mg | ORAL_CAPSULE | Freq: Every day | ORAL | Status: DC

## 2013-09-01 NOTE — Telephone Encounter (Signed)
Rx was sent to pharmacy electronically. 

## 2013-09-04 ENCOUNTER — Other Ambulatory Visit: Payer: Self-pay | Admitting: Family Medicine

## 2013-10-01 ENCOUNTER — Telehealth: Payer: Self-pay | Admitting: Family Medicine

## 2013-10-01 MED ORDER — PANTOPRAZOLE SODIUM 40 MG PO TBEC: DELAYED_RELEASE_TABLET | ORAL | Status: DC

## 2013-10-01 NOTE — Telephone Encounter (Signed)
Rx sent to pharmacy   

## 2013-10-01 NOTE — Telephone Encounter (Signed)
[id number] - [place], [place] - [address] is requesting re-fill on pantoprazole (PROTONIX) 40 MG tablet ° °

## 2013-10-06 ENCOUNTER — Telehealth: Payer: Self-pay | Admitting: Family Medicine

## 2013-10-06 NOTE — Telephone Encounter (Signed)
She was on Lantus and they quit covering that.  Per my last note, we had changed to Levemir.  Are they now not covering that?  Only Lantus and Levemir are equivalent long acting insulins.  We need to know if her insurance will cover the Levemir.

## 2013-10-06 NOTE — Telephone Encounter (Signed)
Pt states she needs a long acting insulin to go with her short acting insulin. States she provided dr. Caryl Never with the letter from her insurance company. Her insurance will no longer pay for the insulin that she was originally on .  [facility] [place]

## 2013-10-07 NOTE — Telephone Encounter (Signed)
Pt is informed that the Levemir is a long acting insulin. Pt is giving 2 samples of Novolog.

## 2013-10-17 ENCOUNTER — Ambulatory Visit (INDEPENDENT_AMBULATORY_CARE_PROVIDER_SITE_OTHER): Payer: Medicare Other | Admitting: Family Medicine

## 2013-10-17 ENCOUNTER — Encounter: Payer: Self-pay | Admitting: Family Medicine

## 2013-10-17 VITALS — BP 130/76 | HR 65 | Wt 158.0 lb

## 2013-10-17 DIAGNOSIS — E119 Type 2 diabetes mellitus without complications: Secondary | ICD-10-CM

## 2013-10-17 MED ORDER — INSULIN DETEMIR 100 UNIT/ML FLEXPEN: 35.0000 [IU] | PEN_INJECTOR | Freq: Every day | SUBCUTANEOUS | Status: DC

## 2013-10-17 MED ORDER — INSULIN ASPART 100 UNIT/ML FLEXPEN: PEN_INJECTOR | SUBCUTANEOUS | Status: DC

## 2013-10-17 NOTE — Progress Notes (Signed)
   Subjective:    Patient ID: Casey Harrell, female    DOB: [birth year], 79 y.o.   MRN: [id number]  Diabetes Pertinent negatives for hypoglycemia include no dizziness, headaches or seizures. Pertinent negatives for diabetes include no chest pain, no fatigue and no weakness.   Patient seen and recently had some confusion regarding insulins. We've gotten mixed messages by phone. Apparently she had change in coverage of insulin. She had been on Lantus and because of insurance we had to change to Levemir. She recently ran out of NovoLog over the past week. Her blood sugars have been increased fasting 149-221. A1c has generally been fairly well controlled on Levemir 35 units once daily and NovoLog 9-14 units with meals. No recent hypoglycemia.  Past Medical History  Diagnosis Date  . DIABETES-TYPE 2 07/15/2008  . DIABETIC PERIPHERAL NEUROPATHY 09/02/2009  . HYPERTENSION 07/15/2008  . ASTHMA 07/15/2008  . GERD 07/15/2008  . PVD (peripheral vascular disease) 06/18/09    RCE  . Normal cardiac stress test     low risk Nuc 2009, Feb 2011  . COPD (chronic obstructive pulmonary disease)    Past Surgical History  Procedure Laterality Date  . Appendectomy  1971  . Abdominal hysterectomy  1971  . Tonsillectomy    . Tubal ligation    . Cesarean section    . Flexible sigmoidoscopy      diverticulitis  . Laparotomy      X 3 with adhesions lysis  . Foot surgery      right foot X 2  . Carotid endarterectomy  06/18/09    RCE    reports that she has never smoked. She has never used smokeless tobacco. She reports that she does not drink alcohol. Her drug history is not on file. family history includes Arthritis in her mother; Asthma in her sister; Cancer in her paternal grandfather; Cancer (age of onset: 5560) in her brother; Celiac disease in her sister; Diabetes in her mother; Heart disease in her brother, father, mother, sister, and sister; Stroke in her maternal grandfather. Allergies  Allergen Reactions    . Doxycycline Hyclate Nausea And Vomiting    REACTION: vomiting  . Iohexol      Code: HIVES, Desc: pt. states she breaks out in hives 06/15/08   . Morphine Sulfate     REACTION: hallucinations  . Moxifloxacin     REACTION: unknown      Review of Systems  Constitutional: Negative for fatigue.  Eyes: Negative for visual disturbance.  Respiratory: Negative for cough, chest tightness, shortness of breath and wheezing.   Cardiovascular: Negative for chest pain, palpitations and leg swelling.  Neurological: Negative for dizziness, seizures, syncope, weakness, light-headedness and headaches.       Objective:   Physical Exam  Constitutional: She appears well-developed and well-nourished.  Cardiovascular: Normal rate and regular rhythm.   Pulmonary/Chest: Effort normal and breath sounds normal. No respiratory distress. She has no wheezes. She has no rales.  Musculoskeletal: She exhibits no edema.          Assessment & Plan:  Type 2 diabetes. History of marginal control. We discussed roles of long and short acting insulin. Samples of NovoLog provided. Refill Levemir and NovoLog through [facility]. Schedule two-month followup. Recheck A1c then.

## 2013-10-17 NOTE — Progress Notes (Signed)
Pre visit review using our clinic review tool, if applicable. No additional management support is needed unless otherwise documented below in the visit note. 

## 2013-11-27 ENCOUNTER — Encounter: Payer: Self-pay | Admitting: Gastroenterology

## 2013-11-27 ENCOUNTER — Encounter: Payer: Self-pay | Admitting: Internal Medicine

## 2013-12-09 ENCOUNTER — Telehealth: Payer: Self-pay | Admitting: Cardiovascular Disease

## 2013-12-09 MED ORDER — DILTIAZEM HCL ER COATED BEADS 120 MG PO CP24
120.0000 mg | ORAL_CAPSULE | Freq: Every day | ORAL | Status: DC
Start: 2013-12-09 — End: 2014-09-23

## 2013-12-09 MED ORDER — CARVEDILOL 25 MG PO TABS: 25.0000 mg | ORAL_TABLET | Freq: Two times a day (BID) | ORAL | Status: DC

## 2013-12-09 NOTE — Telephone Encounter (Signed)
Rx refills sent to patient pharmacy  

## 2013-12-09 NOTE — Telephone Encounter (Signed)
Pt need you to send her prescriptions fir her Carvedilol 25 mg #0 and Diltiazem 120 mg capusules #90. Please send these to [facility],they need these prescriptions to have on file.

## 2013-12-12 ENCOUNTER — Telehealth: Payer: Self-pay | Admitting: Family Medicine

## 2013-12-12 NOTE — Telephone Encounter (Signed)
[facility] - [place], [place] - [address] is requesting 90 day re-fills on the following: olmesartan (BENICAR) 20 MG tablet LYRICA 150 MG capsule metFORMIN (GLUCOPHAGE) 1000 MG tablet

## 2013-12-15 ENCOUNTER — Telehealth: Payer: Self-pay | Admitting: Family Medicine

## 2013-12-15 MED ORDER — PANTOPRAZOLE SODIUM 40 MG PO TBEC: DELAYED_RELEASE_TABLET | ORAL | Status: DC

## 2013-12-15 MED ORDER — SIMVASTATIN 20 MG PO TABS: ORAL_TABLET | ORAL | Status: DC

## 2013-12-15 MED ORDER — LEVOTHYROXINE SODIUM 50 MCG PO TABS: 50.0000 ug | ORAL_TABLET | Freq: Every day | ORAL | Status: DC

## 2013-12-15 MED ORDER — AMITRIPTYLINE HCL 25 MG PO TABS: 25.0000 mg | ORAL_TABLET | Freq: Every day | ORAL | Status: DC

## 2013-12-15 MED ORDER — DULOXETINE HCL 60 MG PO CPEP: ORAL_CAPSULE | ORAL | Status: DC

## 2013-12-15 NOTE — Telephone Encounter (Signed)
Refill for 6 months. 

## 2013-12-15 NOTE — Telephone Encounter (Signed)
Rx's sent to mail order.  

## 2013-12-15 NOTE — Telephone Encounter (Signed)
[facility] - [place], [place] - [address] is requesting re-fills on the following: amitriptyline (ELAVIL) 25 MG tablet levothyroxine (SYNTHROID, LEVOTHROID) 50 MCG tablet DULoxetine (CYMBALTA) 60 MG capsule pantoprazole (PROTONIX) 40 MG tablet simvastatin (ZOCOR) 20 MG tablet

## 2013-12-15 NOTE — Telephone Encounter (Signed)
Lyrica  Last refill 08/05/13 #[facility]  Last visit 10/17/13

## 2013-12-16 MED ORDER — METFORMIN HCL 1000 MG PO TABS: 1000.0000 mg | ORAL_TABLET | Freq: Two times a day (BID) | ORAL | Status: DC

## 2013-12-16 MED ORDER — OLMESARTAN MEDOXOMIL 20 MG PO TABS: 20.0000 mg | ORAL_TABLET | Freq: Every day | ORAL | Status: DC

## 2013-12-16 MED ORDER — PREGABALIN 150 MG PO CAPS: ORAL_CAPSULE | ORAL | Status: DC

## 2013-12-16 NOTE — Telephone Encounter (Signed)
Rx's sent to mail order.  

## 2013-12-19 ENCOUNTER — Telehealth: Payer: Self-pay | Admitting: Family Medicine

## 2013-12-19 NOTE — Telephone Encounter (Signed)
Faxed form to [facility].

## 2013-12-19 NOTE — Telephone Encounter (Signed)
Very low risk with low dose Simvastatin.  OK to continue.

## 2013-12-19 NOTE — Telephone Encounter (Signed)
[facility] needs verification on rx simvastatin (ZOCOR) 20 MG tablet States possible drug interaction with rx cartia xt capsules, wants to confirm if dr. Caryl Neverburchette was aware before filling the rx.

## 2014-02-01 ENCOUNTER — Other Ambulatory Visit: Payer: Self-pay | Admitting: Family Medicine

## 2014-02-19 ENCOUNTER — Other Ambulatory Visit: Payer: Self-pay

## 2014-02-19 MED ORDER — BUDESONIDE 0.25 MG/2ML IN SUSP: 0.2500 mg | Freq: Every day | RESPIRATORY_TRACT | Status: DC

## 2014-02-19 MED ORDER — INSULIN DETEMIR 100 UNIT/ML FLEXPEN: 36.0000 [IU] | PEN_INJECTOR | Freq: Every day | SUBCUTANEOUS | Status: DC

## 2014-03-02 ENCOUNTER — Ambulatory Visit (INDEPENDENT_AMBULATORY_CARE_PROVIDER_SITE_OTHER): Payer: Medicare Other | Admitting: Family Medicine

## 2014-03-02 ENCOUNTER — Encounter: Payer: Self-pay | Admitting: Family Medicine

## 2014-03-02 VITALS — BP 130/84 | HR 98 | Temp 97.8°F | Wt 154.0 lb

## 2014-03-02 DIAGNOSIS — M545 Low back pain, unspecified: Secondary | ICD-10-CM

## 2014-03-02 NOTE — Progress Notes (Signed)
Pre visit review using our clinic review tool, if applicable. No additional management support is needed unless otherwise documented below in the visit note. 

## 2014-03-02 NOTE — Progress Notes (Signed)
Subjective:    Patient ID: Casey Harrell, female    DOB: 02-Apr-1935, 79 y.o.   MRN: [id number]  Back Pain Pertinent negatives include no abdominal pain, chest pain, dysuria or fever.   Patient is seen following fall 1 week ago. She was on a stool and had loss of balance and fell off and apparently struck the stool striking her left flank region. There was no visible bruising. There was no syncope. She's had some off-and-on vertigo over the past week which is usually worse first thing in the morning and clears through the day. She denies any pleuritic pain. No cough. No fever. Pain is 8 out of 10 at times. She taken Aleve with some relief.  She has type 2 diabetes and states her fasting sugars recently been climbing. She is overdue for diabetes assessment. Husband also expresses concerns that she may have some short-term memory issues.  Past Medical History  Diagnosis Date  . DIABETES-TYPE 2 07/15/2008  . DIABETIC PERIPHERAL NEUROPATHY 09/02/2009  . HYPERTENSION 07/15/2008  . ASTHMA 07/15/2008  . GERD 07/15/2008  . PVD (peripheral vascular disease) 06/18/09    RCE  . Normal cardiac stress test     low risk Nuc 2009, Feb 2011  . COPD (chronic obstructive pulmonary disease)    Past Surgical History  Procedure Laterality Date  . Appendectomy  1971  . Abdominal hysterectomy  1971  . Tonsillectomy    . Tubal ligation    . Cesarean section    . Flexible sigmoidoscopy      diverticulitis  . Laparotomy      X 3 with adhesions lysis  . Foot surgery      right foot X 2  . Carotid endarterectomy  06/18/09    RCE    reports that she has never smoked. She has never used smokeless tobacco. She reports that she does not drink alcohol. Her drug history is not on file. family history includes Arthritis in her mother; Asthma in her sister; Cancer in her paternal grandfather; Cancer (age of onset: 2560) in her brother; Celiac disease in her sister; Diabetes in her mother; Heart disease in her brother,  father, mother, sister, and sister; Stroke in her maternal grandfather. Allergies  Allergen Reactions  . Doxycycline Hyclate Nausea And Vomiting    REACTION: vomiting  . Iohexol      Code: HIVES, Desc: pt. states she breaks out in hives 06/15/08   . Morphine Sulfate     REACTION: hallucinations  . Moxifloxacin     REACTION: unknown      Review of Systems  Constitutional: Negative for fever and chills.  Respiratory: Negative for shortness of breath.   Cardiovascular: Negative for chest pain.  Gastrointestinal: Negative for abdominal pain.  Genitourinary: Positive for flank pain. Negative for dysuria, frequency and hematuria.  Musculoskeletal: Positive for back pain.       Objective:   Physical Exam  Constitutional: She appears well-developed and well-nourished.  Cardiovascular: Normal rate and regular rhythm.   Pulmonary/Chest: Effort normal and breath sounds normal. No respiratory distress. She has no wheezes. She has no rales.  Musculoskeletal:  Back exam reveals no spinal tenderness. No visible ecchymosis. She has some tenderness left posterior rib cage area which is fairly localized around the 10th rib          Assessment & Plan:  Recent fall with left back tenderness. We explained that she could have rib fracture but since this would not change management we  decided against x-rays. She is getting fairly good relief with Aleve but we've cautioned against long-term use. Reassess 2 weeks. She will follow-up regarding her diabetes and also assess memory at that time

## 2014-03-16 ENCOUNTER — Ambulatory Visit (INDEPENDENT_AMBULATORY_CARE_PROVIDER_SITE_OTHER): Payer: Medicare Other | Admitting: Family Medicine

## 2014-03-16 ENCOUNTER — Encounter: Payer: Self-pay | Admitting: Family Medicine

## 2014-03-16 VITALS — BP 130/86 | HR 69 | Temp 98.0°F | Wt 154.0 lb

## 2014-03-16 DIAGNOSIS — E039 Hypothyroidism, unspecified: Secondary | ICD-10-CM

## 2014-03-16 DIAGNOSIS — E785 Hyperlipidemia, unspecified: Secondary | ICD-10-CM

## 2014-03-16 DIAGNOSIS — Z23 Encounter for immunization: Secondary | ICD-10-CM

## 2014-03-16 DIAGNOSIS — G3184 Mild cognitive impairment, so stated: Secondary | ICD-10-CM

## 2014-03-16 DIAGNOSIS — IMO0002 Reserved for concepts with insufficient information to code with codable children: Secondary | ICD-10-CM

## 2014-03-16 DIAGNOSIS — I10 Essential (primary) hypertension: Secondary | ICD-10-CM

## 2014-03-16 DIAGNOSIS — E1165 Type 2 diabetes mellitus with hyperglycemia: Secondary | ICD-10-CM

## 2014-03-16 LAB — BASIC METABOLIC PANEL
BUN: 17 mg/dL (ref 6–23)
CO2: 26 mEq/L (ref 19–32)
Calcium: 10 mg/dL (ref 8.4–10.5)
Chloride: 99 mEq/L (ref 96–112)
Creatinine, Ser: 1.1 mg/dL (ref 0.4–1.2)
GFR: 53.44 mL/min — ABNORMAL LOW (ref >60.00)
Glucose, Bld: 121 mg/dL — ABNORMAL HIGH (ref 70–99)
Potassium: 5 mEq/L (ref 3.5–5.1)
Sodium: 138 mEq/L (ref 135–145)

## 2014-03-16 LAB — HEPATIC FUNCTION PANEL
ALT: 27 U/L (ref 0–35)
AST: 35 U/L (ref 0–37)
Albumin: 3.6 g/dL (ref 3.5–5.2)
Alkaline Phosphatase: 83 U/L (ref 39–117)
Bilirubin, Direct: 0 mg/dL (ref 0.0–0.3)
Total Bilirubin: 0.4 mg/dL (ref 0.2–1.2)
Total Protein: 7.8 g/dL (ref 6.0–8.3)

## 2014-03-16 LAB — LIPID PANEL
Cholesterol: 111 mg/dL (ref 0–200)
HDL: 19 mg/dL — ABNORMAL LOW (ref >39.00)
NonHDL: 92
Total CHOL/HDL Ratio: 6
Triglycerides: 259 mg/dL — ABNORMAL HIGH (ref 0.0–149.0)
VLDL: 51.8 mg/dL — ABNORMAL HIGH (ref 0.0–40.0)

## 2014-03-16 LAB — TSH: TSH: 3.14 u[IU]/mL (ref 0.35–4.50)

## 2014-03-16 LAB — HEMOGLOBIN A1C: Hgb A1c MFr Bld: 9.3 % — ABNORMAL HIGH (ref 4.6–6.5)

## 2014-03-16 MED ORDER — INSULIN DETEMIR 100 UNIT/ML FLEXPEN
36.0000 [IU] | PEN_INJECTOR | Freq: Two times a day (BID) | SUBCUTANEOUS | Status: DC
Start: 2014-03-16 — End: 2014-04-09

## 2014-03-16 MED ORDER — BUDESONIDE 0.25 MG/2ML IN SUSP: 0.2500 mg | Freq: Every day | RESPIRATORY_TRACT | Status: DC

## 2014-03-16 MED ORDER — INSULIN DETEMIR 100 UNIT/ML FLEXPEN: 36.0000 [IU] | PEN_INJECTOR | Freq: Two times a day (BID) | SUBCUTANEOUS | Status: DC

## 2014-03-16 NOTE — Progress Notes (Signed)
Subjective:    Patient ID: Casey Harrell, female    DOB: Nov 11, 1934, 79 y.o.   MRN: [id number]  HPI 79 year old presents today for 2 week follow-up after a fall. She states the pain is improving. She had been taking Aleve but is now taking Tylenol as needed. No decreases in daily activity due to pain limitations.  No weaknesses and no decrease in ROM that alter daily life.  History of diabetes, currently uncontrolled. She recently switched from Lantus to Levemir. She states that her sugars are continuously running over 200 now. Only checking sugars in the morning. Takes 36 units daily. Since she has noticed that the sugars are uncontrolled, she self adjusted her doses to twice a day, taking a total of 72 units Levemir daily. She is also still on NovoLog and metformin.  Recent issues with short-term memory. Having a hard time recalling names and misplacing objects. No issues when driving. No wandering. No decreases in activities of daily living. Has been having daytime sleepiness, but they think that is due to the number of medications. She is sleeping through the night.  No anger or hostility.   Review of Systems  Constitutional: Positive for fatigue. Negative for fever, activity change, appetite change and unexpected weight change.  Respiratory: Positive for cough (chronic issues). Negative for chest tightness and shortness of breath.   Cardiovascular: Negative for chest pain.  Gastrointestinal: Negative for nausea, vomiting and abdominal pain.  Musculoskeletal: Positive for back pain (resolving).  Neurological: Negative for dizziness, weakness, numbness and headaches.  Psychiatric/Behavioral: Negative.        Objective:   Physical Exam  Constitutional: She is oriented to person, place, and time. She appears well-developed and well-nourished. No distress.  Cardiovascular: Normal rate, regular rhythm and normal heart sounds.   No murmur heard. Pulmonary/Chest: Effort normal and breath  sounds normal. No respiratory distress. She has no wheezes.  Neurological: She is alert and oriented to person, place, and time.  MMS 27/30  Skin: She is not diaphoretic.  Psychiatric: She has a normal mood and affect. Her behavior is normal. Judgment and thought content normal.  Nursing note and vitals reviewed.         Assessment & Plan:  1. Diabetes mellitus type 2- uncontrolled. Last A1c 8. Recheck today. Talked about controlling diet and decreasing carbs and sugars as well as portion control. Educational handouts were given to help with this.  Monitor sugars more than once a day; alternate before lunch and before supper on different days.  Return to office in 3 months to check A1c again. Addition of a new medication will be decided at that time, possible issues with insurance coverage. Continue on medication regimen of Levemir 36 twice a day, NovoLog and metformin.  2. Short-term memory issues- mild impairment. recent issues with recalling names and location of objects.  Mini-Mental Status exam done today for baseline. Score 27.recheck in 6 months to 1 year.  3. Recent fall- Back pain is resolving. Continue to take Tylenol as needed.  4. Health maintenance Prevnar 13 given today. Routine labs ordered today including lipid panel, TSH and BMP. Patient will return to clinic in 3 months.   Luiz Ochoa, PA-S  As above. Mild cognitive impairment. Patient and husband are opposed to considering medications at this point. Recheck Mini-Mental Status exam in 6-12 months. Repeat TSH. Previous B12 normal.  She also has poorly controlled diabetes and we spent quite some time discussing diet and medications. She is instructed not  to titrate insulin except for as instructed per [place]. She recently doubled up her Levemir dosage.  Will assess A1c and go from there.  Bruce Burchette M.D.

## 2014-03-16 NOTE — Progress Notes (Signed)
Pre visit review using our clinic review tool, if applicable. No additional management support is needed unless otherwise documented below in the visit note. 

## 2014-03-17 ENCOUNTER — Other Ambulatory Visit: Payer: Self-pay

## 2014-03-17 LAB — LDL CHOLESTEROL, DIRECT: Direct LDL: 62.2 mg/dL

## 2014-03-17 MED ORDER — BUDESONIDE 0.25 MG/2ML IN SUSP: 0.2500 mg | Freq: Every day | RESPIRATORY_TRACT | Status: DC

## 2014-03-19 ENCOUNTER — Other Ambulatory Visit: Payer: Self-pay | Admitting: Family Medicine

## 2014-03-19 DIAGNOSIS — IMO0002 Reserved for concepts with insufficient information to code with codable children: Secondary | ICD-10-CM

## 2014-03-19 DIAGNOSIS — E1165 Type 2 diabetes mellitus with hyperglycemia: Secondary | ICD-10-CM

## 2014-04-09 ENCOUNTER — Ambulatory Visit (HOSPITAL_BASED_OUTPATIENT_CLINIC_OR_DEPARTMENT_OTHER)
Admission: RE | Admit: 2014-04-09 | Discharge: 2014-04-09 | Disposition: A | Discharge status: Home / Self Care | Payer: Medicare Other | Source: Ambulatory Visit | Attending: Family Medicine | Admitting: Family Medicine

## 2014-04-09 ENCOUNTER — Emergency Department (HOSPITAL_BASED_OUTPATIENT_CLINIC_OR_DEPARTMENT_OTHER): Payer: Medicare Other

## 2014-04-09 ENCOUNTER — Inpatient Hospital Stay (HOSPITAL_BASED_OUTPATIENT_CLINIC_OR_DEPARTMENT_OTHER)
Admission: EM | Admit: 2014-04-09 | Discharge: 2014-04-11 | DRG: 392 | Disposition: A | Discharge status: Home / Self Care | Payer: Medicare Other | Attending: Internal Medicine | Admitting: Internal Medicine

## 2014-04-09 ENCOUNTER — Ambulatory Visit (INDEPENDENT_AMBULATORY_CARE_PROVIDER_SITE_OTHER): Payer: Medicare Other | Admitting: Family Medicine

## 2014-04-09 ENCOUNTER — Encounter: Payer: Self-pay | Admitting: Family Medicine

## 2014-04-09 ENCOUNTER — Encounter (HOSPITAL_BASED_OUTPATIENT_CLINIC_OR_DEPARTMENT_OTHER): Payer: Self-pay | Admitting: *Deleted

## 2014-04-09 VITALS — BP 137/85 | HR 78 | Temp 97.0°F | Resp 18 | Ht 62.0 in | Wt 148.0 lb

## 2014-04-09 DIAGNOSIS — B349 Viral infection, unspecified: Secondary | ICD-10-CM

## 2014-04-09 DIAGNOSIS — K219 Gastro-esophageal reflux disease without esophagitis: Secondary | ICD-10-CM | POA: Diagnosis present

## 2014-04-09 DIAGNOSIS — R509 Fever, unspecified: Secondary | ICD-10-CM

## 2014-04-09 DIAGNOSIS — Z794 Long term (current) use of insulin: Secondary | ICD-10-CM

## 2014-04-09 DIAGNOSIS — F32A Depression, unspecified: Secondary | ICD-10-CM | POA: Diagnosis present

## 2014-04-09 DIAGNOSIS — R0682 Tachypnea, not elsewhere classified: Secondary | ICD-10-CM

## 2014-04-09 DIAGNOSIS — Z888 Allergy status to other drugs, medicaments and biological substances status: Secondary | ICD-10-CM

## 2014-04-09 DIAGNOSIS — R112 Nausea with vomiting, unspecified: Secondary | ICD-10-CM | POA: Diagnosis not present

## 2014-04-09 DIAGNOSIS — R197 Diarrhea, unspecified: Secondary | ICD-10-CM

## 2014-04-09 DIAGNOSIS — J438 Other emphysema: Secondary | ICD-10-CM

## 2014-04-09 DIAGNOSIS — A084 Viral intestinal infection, unspecified: Secondary | ICD-10-CM | POA: Diagnosis not present

## 2014-04-09 DIAGNOSIS — R059 Cough, unspecified: Secondary | ICD-10-CM

## 2014-04-09 DIAGNOSIS — E1142 Type 2 diabetes mellitus with diabetic polyneuropathy: Secondary | ICD-10-CM | POA: Diagnosis present

## 2014-04-09 DIAGNOSIS — E119 Type 2 diabetes mellitus without complications: Secondary | ICD-10-CM

## 2014-04-09 DIAGNOSIS — F329 Major depressive disorder, single episode, unspecified: Secondary | ICD-10-CM | POA: Diagnosis present

## 2014-04-09 DIAGNOSIS — J45909 Unspecified asthma, uncomplicated: Secondary | ICD-10-CM | POA: Diagnosis present

## 2014-04-09 DIAGNOSIS — E785 Hyperlipidemia, unspecified: Secondary | ICD-10-CM | POA: Diagnosis present

## 2014-04-09 DIAGNOSIS — E860 Dehydration: Secondary | ICD-10-CM | POA: Diagnosis present

## 2014-04-09 DIAGNOSIS — Z90710 Acquired absence of both cervix and uterus: Secondary | ICD-10-CM

## 2014-04-09 DIAGNOSIS — Z91041 Radiographic dye allergy status: Secondary | ICD-10-CM

## 2014-04-09 DIAGNOSIS — I10 Essential (primary) hypertension: Secondary | ICD-10-CM | POA: Diagnosis present

## 2014-04-09 DIAGNOSIS — E039 Hypothyroidism, unspecified: Secondary | ICD-10-CM | POA: Diagnosis present

## 2014-04-09 DIAGNOSIS — R05 Cough: Secondary | ICD-10-CM

## 2014-04-09 DIAGNOSIS — R109 Unspecified abdominal pain: Secondary | ICD-10-CM

## 2014-04-09 DIAGNOSIS — R110 Nausea: Secondary | ICD-10-CM

## 2014-04-09 DIAGNOSIS — K529 Noninfective gastroenteritis and colitis, unspecified: Secondary | ICD-10-CM | POA: Diagnosis present

## 2014-04-09 DIAGNOSIS — E876 Hypokalemia: Secondary | ICD-10-CM | POA: Diagnosis not present

## 2014-04-09 DIAGNOSIS — Z7982 Long term (current) use of aspirin: Secondary | ICD-10-CM

## 2014-04-09 DIAGNOSIS — J449 Chronic obstructive pulmonary disease, unspecified: Secondary | ICD-10-CM | POA: Diagnosis present

## 2014-04-09 DIAGNOSIS — I739 Peripheral vascular disease, unspecified: Secondary | ICD-10-CM | POA: Diagnosis present

## 2014-04-09 LAB — CBC WITH DIFFERENTIAL/PLATELET
Basophils Absolute: 0.1 10*3/uL (ref 0.0–0.1)
Basophils Absolute: 0.1 10*3/uL (ref 0.0–0.1)
Basophils Relative: 0.4 % (ref 0.0–3.0)
Basophils Relative: 1 % (ref 0–1)
Eosinophils Absolute: 0.3 10*3/uL (ref 0.0–0.7)
Eosinophils Absolute: 0.2 10*3/uL (ref 0.0–0.7)
Eosinophils Relative: 2 % (ref 0.0–5.0)
Eosinophils Relative: 2 % (ref 0–5)
HCT: 47.3 % — ABNORMAL HIGH (ref 36.0–46.0)
HCT: 43.5 % (ref 36.0–46.0)
Hemoglobin: 15.1 g/dL — ABNORMAL HIGH (ref 12.0–15.0)
Hemoglobin: 14.6 g/dL (ref 12.0–15.0)
Lymphocytes Relative: 38.2 % (ref 12.0–46.0)
Lymphocytes Relative: 34 % (ref 12–46)
Lymphs Abs: 4.8 10*3/uL — ABNORMAL HIGH (ref 0.7–4.0)
Lymphs Abs: 4.3 10*3/uL — ABNORMAL HIGH (ref 0.7–4.0)
MCH: 28.3 pg (ref 26.0–34.0)
MCHC: 31.9 g/dL (ref 30.0–36.0)
MCHC: 33.6 g/dL (ref 30.0–36.0)
MCV: 89 fl (ref 78.0–100.0)
MCV: 84.3 fL (ref 78.0–100.0)
Monocytes Absolute: 1.1 10*3/uL — ABNORMAL HIGH (ref 0.1–1.0)
Monocytes Absolute: 1 10*3/uL (ref 0.1–1.0)
Monocytes Relative: 8.4 % (ref 3.0–12.0)
Monocytes Relative: 8 % (ref 3–12)
Neutro Abs: 6.5 10*3/uL (ref 1.4–7.7)
Neutro Abs: 6.9 10*3/uL (ref 1.7–7.7)
Neutrophils Relative %: 51 % (ref 43.0–77.0)
Neutrophils Relative %: 55 % (ref 43–77)
Platelets: 342 10*3/uL (ref 150.0–400.0)
Platelets: 330 10*3/uL (ref 150–400)
RBC: 5.31 Mil/uL — ABNORMAL HIGH (ref 3.87–5.11)
RBC: 5.16 MIL/uL — ABNORMAL HIGH (ref 3.87–5.11)
RDW: 15.5 % (ref 11.5–15.5)
RDW: 14.4 % (ref 11.5–15.5)
WBC: 12.7 10*3/uL — ABNORMAL HIGH (ref 4.0–10.5)
WBC: 12.4 10*3/uL — ABNORMAL HIGH (ref 4.0–10.5)

## 2014-04-09 LAB — CBG MONITORING, ED: Glucose-Capillary: 172 mg/dL — ABNORMAL HIGH (ref 70–99)

## 2014-04-09 LAB — LIPASE, BLOOD: Lipase: 10 U/L — ABNORMAL LOW (ref 11–59)

## 2014-04-09 LAB — URINE MICROSCOPIC-ADD ON

## 2014-04-09 LAB — URINALYSIS, ROUTINE W REFLEX MICROSCOPIC
Glucose, UA: NEGATIVE mg/dL
Hgb urine dipstick: NEGATIVE
Ketones, ur: 15 mg/dL — AB
Nitrite: NEGATIVE
Protein, ur: 100 mg/dL — AB
Specific Gravity, Urine: 1.026 (ref 1.005–1.030)
Urobilinogen, UA: 0.2 mg/dL (ref 0.0–1.0)
pH: 5 (ref 5.0–8.0)

## 2014-04-09 LAB — COMPREHENSIVE METABOLIC PANEL
ALT: 32 U/L (ref 0–35)
AST: 44 U/L — ABNORMAL HIGH (ref 0–37)
Albumin: 4.5 g/dL (ref 3.5–5.2)
Alkaline Phosphatase: 101 U/L (ref 39–117)
Anion gap: 23 — ABNORMAL HIGH (ref 5–15)
BUN: 22 mg/dL (ref 6–23)
CO2: 20 mEq/L (ref 19–32)
Calcium: 11.1 mg/dL — ABNORMAL HIGH (ref 8.4–10.5)
Chloride: 96 mEq/L (ref 96–112)
Creatinine, Ser: 1.1 mg/dL (ref 0.50–1.10)
GFR calc Af Amer: 55 mL/min — ABNORMAL LOW (ref >90)
GFR calc non Af Amer: 48 mL/min — ABNORMAL LOW (ref >90)
Glucose, Bld: 180 mg/dL — ABNORMAL HIGH (ref 70–99)
Potassium: 4 mEq/L (ref 3.7–5.3)
Sodium: 139 mEq/L (ref 137–147)
Total Bilirubin: 0.3 mg/dL (ref 0.3–1.2)
Total Protein: 9 g/dL — ABNORMAL HIGH (ref 6.0–8.3)

## 2014-04-09 LAB — BASIC METABOLIC PANEL
Anion gap: 17 — ABNORMAL HIGH (ref 5–15)
BUN: 19 mg/dL (ref 6–23)
CO2: 21 mEq/L (ref 19–32)
Calcium: 9.7 mg/dL (ref 8.4–10.5)
Chloride: 101 mEq/L (ref 96–112)
Creatinine, Ser: 1 mg/dL (ref 0.50–1.10)
GFR calc Af Amer: 62 mL/min — ABNORMAL LOW (ref >90)
GFR calc non Af Amer: 53 mL/min — ABNORMAL LOW (ref >90)
Glucose, Bld: 179 mg/dL — ABNORMAL HIGH (ref 70–99)
Potassium: 3.7 mEq/L (ref 3.7–5.3)
Sodium: 139 mEq/L (ref 137–147)

## 2014-04-09 MED ORDER — ONDANSETRON HCL 4 MG/2ML IJ SOLN
4.0000 mg | Freq: Once | INTRAMUSCULAR | Status: AC
  Administered 2014-04-09: 4 mg via INTRAVENOUS

## 2014-04-09 MED ORDER — SODIUM CHLORIDE 0.9 % IV BOLUS (SEPSIS)
1000.0000 mL | Freq: Once | INTRAVENOUS | Status: AC
  Administered 2014-04-09: 1000 mL via INTRAVENOUS

## 2014-04-09 MED ORDER — ONDANSETRON HCL 4 MG/2ML IJ SOLN
4.0000 mg | Freq: Once | INTRAMUSCULAR | Status: AC
  Administered 2014-04-09: 4 mg via INTRAVENOUS
  Filled 2014-04-09: qty 2

## 2014-04-09 MED ORDER — HYDROMORPHONE HCL 1 MG/ML IJ SOLN
0.5000 mg | Freq: Once | INTRAMUSCULAR | Status: AC
  Administered 2014-04-09: 0.5 mg via INTRAVENOUS

## 2014-04-09 MED ORDER — PROMETHAZINE HCL 12.5 MG PO TABS: ORAL_TABLET | ORAL | Status: DC

## 2014-04-09 MED ORDER — HYDROMORPHONE HCL 1 MG/ML IJ SOLN
INTRAMUSCULAR | Status: AC
  Filled 2014-04-09: qty 1

## 2014-04-09 MED ORDER — ONDANSETRON 4 MG PO TBDP: 4.0000 mg | ORAL_TABLET | Freq: Three times a day (TID) | ORAL | Status: DC | PRN

## 2014-04-09 MED ORDER — ONDANSETRON HCL 4 MG/2ML IJ SOLN
INTRAMUSCULAR | Status: AC
  Filled 2014-04-09: qty 2

## 2014-04-09 MED ORDER — BARIUM SULFATE 2.1 % PO SUSP: 900.0000 mL | ORAL | Status: AC

## 2014-04-09 MED ORDER — HYDROMORPHONE HCL 1 MG/ML IJ SOLN
0.5000 mg | Freq: Once | INTRAMUSCULAR | Status: AC
  Administered 2014-04-09: 0.5 mg via INTRAVENOUS
  Filled 2014-04-09: qty 1

## 2014-04-09 NOTE — ED Notes (Signed)
Pt. Is nauseated and having multiple episodes of diarrhea.  Pt. Reports pain in her abd. And asking for medicine for nausea and pain.

## 2014-04-09 NOTE — Progress Notes (Signed)
OFFICE VISIT  04/09/2014   CC:  Chief Complaint  Patient presents with  . Diarrhea  . Fever    Monday  . Nausea   HPI:    Patient is a 79 y.o. Caucasian female who presents for several symptoms:  4 days ago she had fever of 101, nausea, cough, SOB/wheezing, diarrhea, chills, headache, stomache ache.  She has achiness in all muscles and joints as well.  No joint swelling.  No rash. All of these sx's have persisted but she has not vomited, just dry heaved.  She has been drinking sprite and some electrolyte solution but very little water and has not vomited this.  She ate an egg this morning and yest morning, otherwise no solid food.  Even with imodium she is having up to 10 watery BM's (explosive) per day.   Lives with husband and he is well.  Gluc 215 this morning, which is "normal" for her. BP at home: 148/76 today.  ROS: urine output is down, urine is dark yellow  Past Medical History  Diagnosis Date  . DIABETES-TYPE 2 07/15/2008  . DIABETIC PERIPHERAL NEUROPATHY 09/02/2009  . HYPERTENSION 07/15/2008  . ASTHMA 07/15/2008  . GERD 07/15/2008  . PVD (peripheral vascular disease) 06/18/09    RCE  . Normal cardiac stress test     low risk Nuc 2009, Feb 2011  . COPD (chronic obstructive pulmonary disease)     Past Surgical History  Procedure Laterality Date  . Appendectomy  1971  . Abdominal hysterectomy  1971  . Tonsillectomy    . Tubal ligation    . Cesarean section    . Flexible sigmoidoscopy      diverticulitis  . Laparotomy      X 3 with adhesions lysis  . Foot surgery      right foot X 2  . Carotid endarterectomy  06/18/09    RCE   MEDS: not on albuterol and budesonide listed below Outpatient Prescriptions Prior to Visit  Medication Sig Dispense Refill  . amitriptyline (ELAVIL) 25 MG tablet Take 1 tablet (25 mg total) by mouth at bedtime. 90 tablet 3  . aspirin 81 MG tablet Take 81 mg by mouth daily.      . B-D ULTRAFINE III SHORT PEN 31G X 8 MM MISC USE AS  DIRECTED 100 each 1  . budesonide (PULMICORT) 0.25 MG/2ML nebulizer solution Take 2 mLs (0.25 mg total) by nebulization daily. J44.9 60 mL 3  . Calcium Citrate (CITRACAL PO) Take 1 tablet by mouth 2 (two) times daily.     . carvedilol (COREG) 25 MG tablet Take 1 tablet (25 mg total) by mouth 2 (two) times daily with a meal. 180 tablet 1  . Cholecalciferol (VITAMIN D3) 2000 UNITS TABS Take 2,000 Units by mouth daily.     Marland Kitchen. CLEVER CHEK AUTO-CODE VOICE test strip     . diltiazem (CARDIZEM CD) 120 MG 24 hr capsule Take 1 capsule (120 mg total) by mouth daily. 90 capsule 1  . DULoxetine (CYMBALTA) 60 MG capsule TAKE 1 CAPSULE BY MOUTH DAILY 90 capsule 3  . insulin aspart (NOVOLOG FLEXPEN) 100 UNIT/ML FlexPen Inject 9-14 units into the skin 2 (two) daily with a meal. 9 units "Before" breakfast and 14 units "After" supper 15 mL 5  . Insulin Detemir (LEVEMIR FLEXTOUCH) 100 UNIT/ML Pen Inject 36 Units into the skin 2 (two) times daily. 15 mL 5  . Lancets MISC     . levothyroxine (SYNTHROID, LEVOTHROID) 50 MCG tablet  Take 1 tablet (50 mcg total) by mouth daily before breakfast. 90 tablet 3  . magnesium oxide (MAG-OX) 400 (241.3 MG) MG tablet TAKE 1 TABLET BY MOUTH DAILY 30 tablet 3  . metFORMIN (GLUCOPHAGE) 1000 MG tablet Take 1 tablet (1,000 mg total) by mouth 2 (two) times daily with a meal. 180 tablet 3  . olmesartan (BENICAR) 20 MG tablet Take 1 tablet (20 mg total) by mouth daily. 90 tablet 2  . pantoprazole (PROTONIX) 40 MG tablet TAKE 1 TABLET BY MOUTH TWICE DAILY 180 tablet 3  . pregabalin (LYRICA) 150 MG capsule TAKE ONE CAPSULE BY MOUTH DAILY 90 capsule 1  . simvastatin (ZOCOR) 20 MG tablet TAKE 1 TABLET BY MOUTH AT BEDTIME 90 tablet 3  . Insulin Detemir (LEVEMIR FLEXTOUCH) 100 UNIT/ML Pen Inject 36 Units into the skin 2 (two) times daily. 15 mL 5  . albuterol (ACCUNEB) 1.25 MG/3ML nebulizer solution Take 1 ampule by nebulization every 6 (six) hours as needed for wheezing.     No  facility-administered medications prior to visit.    Allergies  Allergen Reactions  . Doxycycline Hyclate Nausea And Vomiting    REACTION: vomiting  . Iohexol      Code: HIVES, Desc: pt. states she breaks out in hives 06/15/08   . Morphine Sulfate     REACTION: hallucinations  . Moxifloxacin     REACTION: unknown    ROS As per HPI  PE: Blood pressure 137/85, pulse 78, temperature 97 F (36.1 C), temperature source Temporal, resp. rate 18, height 5\' 2"  (1.575 m), weight 148 lb (67.132 kg), SpO2 95 %. Gen: alert, shivering, nontoxic.  Oriented x 4. ENT: Ears: EACs clear, normal epithelium.  TMs with good light reflex and landmarks bilaterally.  Eyes: no injection, icteris, swelling, or exudate.  EOMI, PERRLA. Nose: no drainage or turbinate edema/swelling.  No injection or focal lesion.  Mouth: lips without lesion/swelling.  Oral mucosa pink and moist.  Dentition intact and without obvious caries or gingival swelling.  Oropharynx without erythema, exudate, or swelling.  Neck - No masses or thyromegaly or limitation in range of motion CV: RRR, no m/r/g.   LUNGS: CTA bilat, RR about 50/min but nonlabored resps, good aeration in all lung fields. ABD: no distention.  Soft, with mild/mod TTP diffusely all over abdomen except for LUQ.  No guarding or rebound. BS normal except a bit hyperactive in LLQ.  No bruit or mass or palpable organomegaly. EXT: no c/c/e. SKIN: no rash or jaundice.   LABS:  None today  IMPRESSION AND PLAN:  Viral syndrome.   Suspect mild/mod dehydration, plus her increased RR may be a sign that she has developed a secondary chest infection.  Will check [facility]R at [facility] ASAP. Check CBC w/diff and CMET now. Her GI sx's seem to be letting up a bit the last 24h per her report but the nausea still is her most bothersome symptom: will rx phenergan 12.5mg , 1-2 q6h po prn. Continue imodium as she is currently doing.  Push oral rehydration, esp now that she'll have  anti-emetic on board.   For now, stop metformin.  May advise her to stop additional meds based on results of blood work today.    An After Visit Summary was printed and given to the patient.  FOLLOW UP: To be determined based on results of pending workup.

## 2014-04-09 NOTE — ED Notes (Signed)
Bedside potty at bedside of Pt.   Pt. Still unable to urinate at present time.

## 2014-04-09 NOTE — ED Notes (Signed)
Pt c/o n/v/d since Monday.  ?

## 2014-04-09 NOTE — ED Provider Notes (Signed)
CSN: [id number]     Arrival date & time 04/09/14  1636 History   First MD Initiated Contact with Patient 04/09/14 1649     Chief Complaint  Patient presents with  . Vomiting     (Consider location/radiation/quality/duration/timing/severity/associated sxs/prior Treatment) HPI Comments: Patient with past medical history of diabetes, hypertension, asthma, GERD, COPD, and multiple abdominal surgeries, presents to the emergency department with chief complaint of cough, abdominal pain, nausea, vomiting, diarrhea 3 days. She reports associated fever to 101. She was seen by her memory care doctor today, and [facility].  She has tried taking Phenergan with no relief. She states that anything she tries to eat or drink, comes back up.   The history is provided by the patient. No language interpreter was used.    Past Medical History  Diagnosis Date  . DIABETES-TYPE 2 07/15/2008  . DIABETIC PERIPHERAL NEUROPATHY 09/02/2009  . HYPERTENSION 07/15/2008  . ASTHMA 07/15/2008  . GERD 07/15/2008  . PVD (peripheral vascular disease) 06/18/09    RCE  . Normal cardiac stress test     low risk Nuc 2009, Feb 2011  . COPD (chronic obstructive pulmonary disease)    Past Surgical History  Procedure Laterality Date  . Appendectomy  1971  . Abdominal hysterectomy  1971  . Tonsillectomy    . Tubal ligation    . Cesarean section    . Flexible sigmoidoscopy      diverticulitis  . Laparotomy      X 3 with adhesions lysis  . Foot surgery      right foot X 2  . Carotid endarterectomy  06/18/09    RCE   Family History  Problem Relation Age of Onset  . Diabetes Mother   . Arthritis Mother   . Heart disease Mother   . Heart disease Father   . Celiac disease Sister   . Stroke Maternal Grandfather   . Cancer Paternal Grandfather     lung  . Heart disease Sister   . Heart disease Sister   . Asthma Sister   . Heart disease Brother   . Cancer Brother 60    lung  cancer   History  Substance Use Topics  . Smoking status: Never Smoker   . Smokeless tobacco: Never Used  . Alcohol Use: No   OB History    No data available     Review of Systems  Constitutional: Positive for fever. Negative for chills.  Respiratory: Positive for cough. Negative for shortness of breath.   Cardiovascular: Negative for chest pain.  Gastrointestinal: Positive for nausea, vomiting and diarrhea. Negative for constipation.  Genitourinary: Negative for dysuria.  All other systems reviewed and are negative.     Allergies  Doxycycline hyclate; Iohexol; Morphine sulfate; and Moxifloxacin  Home Medications   Prior to Admission medications   Medication Sig Start Date End Date Taking? Authorizing Provider  albuterol (ACCUNEB) 1.25 MG/3ML nebulizer solution Take 1 ampule by nebulization every 6 (six) hours as needed for wheezing.    Historical Provider, MD  amitriptyline (ELAVIL) 25 MG tablet Take 1 tablet (25 mg total) by mouth at bedtime. 12/15/13   Kristian CoveyBruce W Burchette, MD  aspirin 81 MG tablet Take 81 mg by mouth daily.      Historical Provider, MD  B-D ULTRAFINE III SHORT PEN 31G X 8 MM MISC USE AS DIRECTED 09/04/13   Kristian CoveyBruce W Burchette, MD  budesonide (PULMICORT) 0.25 MG/2ML nebulizer solution  Take 2 mLs (0.25 mg total) by nebulization daily. J44.9 03/17/14   [name], MD  Calcium Citrate (CITRACAL PO) Take 1 tablet by mouth 2 (two) times daily.     Historical Provider, MD  carvedilol (COREG) 25 MG tablet Take 1 tablet (25 mg total) by mouth 2 (two) times daily with a meal. 12/09/13   Runell Gess, MD  Cholecalciferol (VITAMIN D3) 2000 UNITS TABS Take 2,000 Units by mouth daily.     Historical Provider, MD  CLEVER CHEK AUTO-CODE VOICE test strip  05/07/11   Historical Provider, MD  diltiazem (CARDIZEM CD) 120 MG 24 hr capsule Take 1 capsule (120 mg total) by mouth daily. 12/09/13   Runell Gess, MD  DULoxetine (CYMBALTA) 60 MG capsule TAKE 1 CAPSULE BY MOUTH  DAILY 12/15/13   [name], MD  insulin aspart (NOVOLOG FLEXPEN) 100 UNIT/ML FlexPen Inject 9-14 units into the skin 2 (two) daily with a meal. 9 units "Before" breakfast and 14 units "After" supper 10/17/13   [name], MD  Insulin Detemir (LEVEMIR FLEXTOUCH) 100 UNIT/ML Pen Inject 36 Units into the skin 2 (two) times daily. 03/16/14   [name], MD  Lancets MISC  05/07/11   Historical Provider, MD  levothyroxine (SYNTHROID, LEVOTHROID) 50 MCG tablet Take 1 tablet (50 mcg total) by mouth daily before breakfast. 12/15/13   [name], MD  magnesium oxide (MAG-OX) 400 (241.3 MG) MG tablet TAKE 1 TABLET BY MOUTH DAILY    [name], MD  metFORMIN (GLUCOPHAGE) 1000 MG tablet Take 1 tablet (1,000 mg total) by mouth 2 (two) times daily with a meal. 12/16/13   [name], MD  olmesartan (BENICAR) 20 MG tablet Take 1 tablet (20 mg total) by mouth daily. 12/16/13   [name], MD  pantoprazole (PROTONIX) 40 MG tablet TAKE 1 TABLET BY MOUTH TWICE DAILY 12/15/13   [name], MD  pregabalin (LYRICA) 150 MG capsule TAKE ONE CAPSULE BY MOUTH DAILY 12/16/13   [name], MD  promethazine (PHENERGAN) 12.5 MG tablet 1-2 tabs po q6h prn nausea 04/09/14   Jeoffrey Massed, MD  simvastatin (ZOCOR) 20 MG tablet TAKE 1 TABLET BY MOUTH AT BEDTIME 12/15/13   [name], MD   BP 169/80 mmHg  Pulse 88  Temp(Src) 97.9 F (36.6 C) (Temporal)  Resp 24  Ht 5' 1.5" (1.562 m)  Wt 148 lb (67.132 kg)  BMI 27.51 kg/m2  SpO2 100% Physical Exam  Constitutional: She is oriented to person, place, and time. She appears well-developed and well-nourished.  HENT:  Head: Normocephalic and atraumatic.  Eyes: Conjunctivae and EOM are normal. Pupils are equal, round, and reactive to light.  Neck: Normal range of motion. Neck supple.  Cardiovascular: Normal rate and regular rhythm.  Exam reveals no gallop and no friction rub.   No murmur heard. Pulmonary/Chest: Effort  normal and breath sounds normal. No respiratory distress. She has no wheezes. She has no rales. She exhibits no tenderness.  Clear to auscultation bilaterally  Abdominal: Soft. Bowel sounds are normal. She exhibits no distension and no mass. There is no tenderness. There is no rebound and no guarding.  Abdomen is moderately tender to palpation in the right and left lower quadrants, no other focal abdominal tenderness  Musculoskeletal: Normal range of motion. She exhibits no edema or tenderness.  Neurological: She is alert and oriented to person, place, and time.  Skin: Skin is warm and dry.  Psychiatric: She has a normal mood and affect. Her behavior is normal. Judgment and thought content normal.  Nursing note and vitals reviewed.   ED Course  Procedures (including critical care time) Labs Review Labs Reviewed  CBC WITH DIFFERENTIAL  COMPREHENSIVE METABOLIC PANEL  LIPASE, BLOOD  URINALYSIS, ROUTINE W REFLEX MICROSCOPIC    Imaging Reviewh  Dg Chest 2 View  04/09/2014   CLINICAL DATA:  Fever, rapid breathing, nausea  EXAM: CHEST  2 VIEW  COMPARISON:  Chest x-ray of 02/03/2013 and CT chest of the same day  FINDINGS: No active infiltrate or effusion is seen. Mild peribronchial thickening may indicate bronchitis. Mediastinal and hilar contours are unremarkable. The heart is within normal limits in size. No bony abnormality is seen.  IMPRESSION: Mild peribronchial thickening.  No pneumonia.   Electronically Signed   By: Dwyane DeePaul  Barry M.D.   On: 04/09/2014 16:30     EKG Interpretation None      MDM   Final diagnoses:  Abdominal pain  Cough    Patient with abdominal pain, n/v/d, and cough.  Sent here by PCP for CXR.  Patient also has significant abdominal pain and will likely need a CT scan.  Will give oral contrast only, as the patient has a contrast allergy.  Will treat pain and check labs and reassess.  9:00 PM  Imaging is reassuring. Labs remarkable for anion gap of 23, but not  acidotic. Will recheck chem 8 after fluids.  If gap is resolving, will discharge to home.  If still high, consider admission.  Patient is tolerating oral intake in the emergency department. Patient seen by and discussed with Dr. Donnald GarrePfeiffer, who recommends rechecking BMP.   10:19 PM Patient signed out to Dr. Donnald GarrePfeiffer, who will continue care.  Plan:  Follow-up on BMP.  Roxy HorsemanRobert Browning, PA-C 04/09/14 2219  Medical screening examination/treatment/procedure(s) were conducted as a shared visit with non-physician practitioner(s) and myself.  I personally evaluated the patient during the encounter.   EKG Interpretation None     Despite rehydration the patient continued to have large diarrheal stool. At this point I did not feel that she would be able to adequately maintain hydration at home based on observation of recurrent fluid loss via vomiting and diarrhea in the emergency department the patient will be admitted for ongoing symptoms consistent with gastroenteritis and dehydration with generalized weakness.  Arby BarretteMarcy Iosefa Weintraub, MD 04/21/14 [phone]

## 2014-04-09 NOTE — Discharge Instructions (Signed)
Cough, Adult  A cough is a reflex that helps clear your throat and airways. It can help heal the body or may be a reaction to an irritated airway. A cough may only last 2 or 3 weeks (acute) or may last more than 8 weeks (chronic).  CAUSES Acute cough:  Viral or bacterial infections. Chronic cough:  Infections.  Allergies.  Asthma.  Post-nasal drip.  Smoking.  Heartburn or acid reflux.  Some medicines.  Chronic lung problems (COPD).  Cancer. SYMPTOMS   Cough.  Fever.  Chest pain.  Increased breathing rate.  High-pitched whistling sound when breathing (wheezing).  Colored mucus that you cough up (sputum). TREATMENT   A bacterial cough may be treated with antibiotic medicine.  A viral cough must run its course and will not respond to antibiotics.  Your caregiver may recommend other treatments if you have a chronic cough. HOME CARE INSTRUCTIONS   Only take over-the-counter or prescription medicines for pain, discomfort, or fever as directed by your caregiver. Use cough suppressants only as directed by your caregiver.  Use a cold steam vaporizer or humidifier in your bedroom or home to help loosen secretions.  Sleep in a semi-upright position if your cough is worse at night.  Rest as needed.  Stop smoking if you smoke. SEEK IMMEDIATE MEDICAL CARE IF:   You have pus in your sputum.  Your cough starts to worsen.  You cannot control your cough with suppressants and are losing sleep.  You begin coughing up blood.  You have difficulty breathing.  You develop pain which is getting worse or is uncontrolled with medicine.  You have a fever. MAKE SURE YOU:   Understand these instructions.  Will watch your condition.  Will get help right away if you are not doing well or get worse. Document Released: 10/21/2010 Document Revised: 07/17/2011 Document Reviewed: 10/21/2010 [facility] Patient Information 2015 [place], [place]. This information is not intended  to replace advice given to you by your health care provider. Make sure you discuss any questions you have with your health care provider. Diarrhea Diarrhea is frequent loose and watery bowel movements. It can cause you to feel weak and dehydrated. Dehydration can cause you to become tired and thirsty, have a dry mouth, and have decreased urination that often is dark yellow. Diarrhea is a sign of another problem, most often an infection that will not last long. In most cases, diarrhea typically lasts 2-3 days. However, it can last longer if it is a sign of something more serious. It is important to treat your diarrhea as directed by your caregiver to lessen or prevent future episodes of diarrhea. CAUSES  Some common causes include:  Gastrointestinal infections caused by viruses, bacteria, or parasites.  Food poisoning or food allergies.  Certain medicines, such as antibiotics, chemotherapy, and laxatives.  Artificial sweeteners and fructose.  Digestive disorders. HOME CARE INSTRUCTIONS  Ensure adequate fluid intake (hydration): Have 1 cup (8 oz) of fluid for each diarrhea episode. Avoid fluids that contain simple sugars or sports drinks, fruit juices, whole milk products, and sodas. Your urine should be clear or pale yellow if you are drinking enough fluids. Hydrate with an oral rehydration solution that you can purchase at pharmacies, retail stores, and online. You can prepare an oral rehydration solution at home by mixing the following ingredients together:   - tsp table salt.   tsp baking soda.   tsp salt substitute containing potassium chloride.  1  tablespoons sugar.  1 L (34  oz) of water.  Certain foods and beverages may increase the speed at which food moves through the gastrointestinal (GI) tract. These foods and beverages should be avoided and include:  Caffeinated and alcoholic beverages.  High-fiber foods, such as raw fruits and vegetables, nuts, seeds, and whole grain  breads and cereals.  Foods and beverages sweetened with sugar alcohols, such as xylitol, sorbitol, and mannitol.  Some foods may be well tolerated and may help thicken stool including:  Starchy foods, such as rice, toast, pasta, low-sugar cereal, oatmeal, grits, baked potatoes, crackers, and bagels.  Bananas.  Applesauce.  Add probiotic-rich foods to help increase healthy bacteria in the GI tract, such as yogurt and fermented milk products.  Wash your hands well after each diarrhea episode.  Only take over-the-counter or prescription medicines as directed by your caregiver.  Take a warm bath to relieve any burning or pain from frequent diarrhea episodes. SEEK IMMEDIATE MEDICAL CARE IF:   You are unable to keep fluids down.  You have persistent vomiting.  You have blood in your stool, or your stools are black and tarry.  You do not urinate in 6-8 hours, or there is only a small amount of very dark urine.  You have abdominal pain that increases or localizes.  You have weakness, dizziness, confusion, or light-headedness.  You have a severe headache.  Your diarrhea gets worse or does not get better.  You have a fever or persistent symptoms for more than 2-3 days.  You have a fever and your symptoms suddenly get worse. MAKE SURE YOU:   Understand these instructions.  Will watch your condition.  Will get help right away if you are not doing well or get worse. Document Released: 04/14/2002 Document Revised: 09/08/2013 Document Reviewed: 12/31/2011 [facility] Patient Information 2015 [place], [place]. This information is not intended to replace advice given to you by your health care provider. Make sure you discuss any questions you have with your health care provider. Nausea and Vomiting Nausea is a sick feeling that often comes before throwing up (vomiting). Vomiting is a reflex where stomach contents come out of your mouth. Vomiting can cause severe loss of body fluids  (dehydration). Children and elderly adults can become dehydrated quickly, especially if they also have diarrhea. Nausea and vomiting are symptoms of a condition or disease. It is important to find the cause of your symptoms. CAUSES   Direct irritation of the stomach lining. This irritation can result from increased acid production (gastroesophageal reflux disease), infection, food poisoning, taking certain medicines (such as nonsteroidal anti-inflammatory drugs), alcohol use, or tobacco use.  Signals from the brain.These signals could be caused by a headache, heat exposure, an inner ear disturbance, increased pressure in the brain from injury, infection, a tumor, or a concussion, pain, emotional stimulus, or metabolic problems.  An obstruction in the gastrointestinal tract (bowel obstruction).  Illnesses such as diabetes, hepatitis, gallbladder problems, appendicitis, kidney problems, cancer, sepsis, atypical symptoms of a heart attack, or eating disorders.  Medical treatments such as chemotherapy and radiation.  Receiving medicine that makes you sleep (general anesthetic) during surgery. DIAGNOSIS Your caregiver may ask for tests to be done if the problems do not improve after a few days. Tests may also be done if symptoms are severe or if the reason for the nausea and vomiting is not clear. Tests may include:  Urine tests.  Blood tests.  Stool tests.  Cultures (to look for evidence of infection).  X-rays or other imaging studies. Test  results can help your caregiver make decisions about treatment or the need for additional tests. TREATMENT You need to stay well hydrated. Drink frequently but in small amounts.You may wish to drink water, sports drinks, clear broth, or eat frozen ice pops or gelatin dessert to help stay hydrated.When you eat, eating slowly may help prevent nausea.There are also some antinausea medicines that may help prevent nausea. HOME CARE INSTRUCTIONS   Take  all medicine as directed by your caregiver.  If you do not have an appetite, do not force yourself to eat. However, you must continue to drink fluids.  If you have an appetite, eat a normal diet unless your caregiver tells you differently.  Eat a variety of complex carbohydrates (rice, wheat, potatoes, bread), lean meats, yogurt, fruits, and vegetables.  Avoid high-fat foods because they are more difficult to digest.  Drink enough water and fluids to keep your urine clear or pale yellow.  If you are dehydrated, ask your caregiver for specific rehydration instructions. Signs of dehydration may include:  Severe thirst.  Dry lips and mouth.  Dizziness.  Dark urine.  Decreasing urine frequency and amount.  Confusion.  Rapid breathing or pulse. SEEK IMMEDIATE MEDICAL CARE IF:   You have blood or brown flecks (like coffee grounds) in your vomit.  You have black or bloody stools.  You have a severe headache or stiff neck.  You are confused.  You have severe abdominal pain.  You have chest pain or trouble breathing.  You do not urinate at least once every 8 hours.  You develop cold or clammy skin.  You continue to vomit for longer than 24 to 48 hours.  You have a fever. MAKE SURE YOU:   Understand these instructions.  Will watch your condition.  Will get help right away if you are not doing well or get worse. Document Released: 04/24/2005 Document Revised: 07/17/2011 Document Reviewed: 09/21/2010 [facility] Patient Information 2015 [place], [place]. This information is not intended to replace advice given to you by your health care provider. Make sure you discuss any questions you have with your health care provider.

## 2014-04-09 NOTE — Progress Notes (Signed)
Pre visit review using our clinic review tool, if applicable. No additional management support is needed unless otherwise documented below in the visit note. 

## 2014-04-10 ENCOUNTER — Encounter (HOSPITAL_COMMUNITY): Payer: Self-pay | Admitting: Internal Medicine

## 2014-04-10 DIAGNOSIS — E039 Hypothyroidism, unspecified: Secondary | ICD-10-CM | POA: Diagnosis present

## 2014-04-10 DIAGNOSIS — Z91041 Radiographic dye allergy status: Secondary | ICD-10-CM | POA: Diagnosis not present

## 2014-04-10 DIAGNOSIS — E876 Hypokalemia: Secondary | ICD-10-CM | POA: Diagnosis not present

## 2014-04-10 DIAGNOSIS — Z794 Long term (current) use of insulin: Secondary | ICD-10-CM | POA: Diagnosis not present

## 2014-04-10 DIAGNOSIS — J45909 Unspecified asthma, uncomplicated: Secondary | ICD-10-CM | POA: Diagnosis present

## 2014-04-10 DIAGNOSIS — J449 Chronic obstructive pulmonary disease, unspecified: Secondary | ICD-10-CM | POA: Diagnosis present

## 2014-04-10 DIAGNOSIS — F329 Major depressive disorder, single episode, unspecified: Secondary | ICD-10-CM | POA: Diagnosis present

## 2014-04-10 DIAGNOSIS — E1142 Type 2 diabetes mellitus with diabetic polyneuropathy: Secondary | ICD-10-CM | POA: Diagnosis present

## 2014-04-10 DIAGNOSIS — I10 Essential (primary) hypertension: Secondary | ICD-10-CM | POA: Diagnosis present

## 2014-04-10 DIAGNOSIS — R112 Nausea with vomiting, unspecified: Secondary | ICD-10-CM

## 2014-04-10 DIAGNOSIS — E860 Dehydration: Secondary | ICD-10-CM | POA: Diagnosis present

## 2014-04-10 DIAGNOSIS — E119 Type 2 diabetes mellitus without complications: Secondary | ICD-10-CM

## 2014-04-10 DIAGNOSIS — R197 Diarrhea, unspecified: Secondary | ICD-10-CM

## 2014-04-10 DIAGNOSIS — Z888 Allergy status to other drugs, medicaments and biological substances status: Secondary | ICD-10-CM | POA: Diagnosis not present

## 2014-04-10 DIAGNOSIS — A084 Viral intestinal infection, unspecified: Secondary | ICD-10-CM | POA: Diagnosis present

## 2014-04-10 DIAGNOSIS — E785 Hyperlipidemia, unspecified: Secondary | ICD-10-CM | POA: Diagnosis present

## 2014-04-10 DIAGNOSIS — Z90710 Acquired absence of both cervix and uterus: Secondary | ICD-10-CM | POA: Diagnosis not present

## 2014-04-10 DIAGNOSIS — I739 Peripheral vascular disease, unspecified: Secondary | ICD-10-CM | POA: Diagnosis present

## 2014-04-10 DIAGNOSIS — K219 Gastro-esophageal reflux disease without esophagitis: Secondary | ICD-10-CM | POA: Diagnosis present

## 2014-04-10 DIAGNOSIS — Z7982 Long term (current) use of aspirin: Secondary | ICD-10-CM | POA: Diagnosis not present

## 2014-04-10 LAB — CBC WITH DIFFERENTIAL/PLATELET
Basophils Absolute: 0 10*3/uL (ref 0.0–0.1)
Basophils Relative: 0 % (ref 0–1)
Eosinophils Absolute: 0.3 10*3/uL (ref 0.0–0.7)
Eosinophils Relative: 2 % (ref 0–5)
HCT: 38.9 % (ref 36.0–46.0)
Hemoglobin: 12.9 g/dL (ref 12.0–15.0)
Lymphocytes Relative: 38 % (ref 12–46)
Lymphs Abs: 3.9 10*3/uL (ref 0.7–4.0)
MCH: 28.2 pg (ref 26.0–34.0)
MCHC: 33.2 g/dL (ref 30.0–36.0)
MCV: 84.9 fL (ref 78.0–100.0)
Monocytes Absolute: 0.9 10*3/uL (ref 0.1–1.0)
Monocytes Relative: 8 % (ref 3–12)
Neutro Abs: 5.3 10*3/uL (ref 1.7–7.7)
Neutrophils Relative %: 52 % (ref 43–77)
Platelets: 284 10*3/uL (ref 150–400)
RBC: 4.58 MIL/uL (ref 3.87–5.11)
RDW: 14.5 % (ref 11.5–15.5)
WBC: 10.3 10*3/uL (ref 4.0–10.5)

## 2014-04-10 LAB — COMPREHENSIVE METABOLIC PANEL
ALT: 26 U/L (ref 0–35)
AST: 34 U/L (ref 0–37)
Albumin: 3.9 g/dL (ref 3.5–5.2)
Alkaline Phosphatase: 82 U/L (ref 39–117)
Anion gap: 18 — ABNORMAL HIGH (ref 5–15)
BUN: 13 mg/dL (ref 6–23)
CO2: 20 mEq/L (ref 19–32)
Calcium: 9.5 mg/dL (ref 8.4–10.5)
Chloride: 101 mEq/L (ref 96–112)
Creatinine, Ser: 0.79 mg/dL (ref 0.50–1.10)
GFR calc Af Amer: >90 mL/min (ref >90)
GFR calc non Af Amer: 79 mL/min — ABNORMAL LOW (ref >90)
Glucose, Bld: 185 mg/dL — ABNORMAL HIGH (ref 70–99)
Potassium: 3.4 mEq/L — ABNORMAL LOW (ref 3.7–5.3)
Sodium: 139 mEq/L (ref 137–147)
Total Bilirubin: 0.3 mg/dL (ref 0.3–1.2)
Total Protein: 7.4 g/dL (ref 6.0–8.3)

## 2014-04-10 LAB — GLUCOSE, CAPILLARY
Glucose-Capillary: 180 mg/dL — ABNORMAL HIGH (ref 70–99)
Glucose-Capillary: 196 mg/dL — ABNORMAL HIGH (ref 70–99)
Glucose-Capillary: 221 mg/dL — ABNORMAL HIGH (ref 70–99)
Glucose-Capillary: 217 mg/dL — ABNORMAL HIGH (ref 70–99)
Glucose-Capillary: 217 mg/dL — ABNORMAL HIGH (ref 70–99)

## 2014-04-10 LAB — TSH: TSH: 4.07 u[IU]/mL (ref 0.350–4.500)

## 2014-04-10 LAB — CLOSTRIDIUM DIFFICILE BY PCR: Toxigenic C. Difficile by PCR: NEGATIVE

## 2014-04-10 MED ORDER — DULOXETINE HCL 60 MG PO CPEP
60.0000 mg | ORAL_CAPSULE | Freq: Every day | ORAL | Status: DC
  Administered 2014-04-10 – 2014-04-11 (×2): 60 mg via ORAL
  Filled 2014-04-10 (×3): qty 1

## 2014-04-10 MED ORDER — AMITRIPTYLINE HCL 25 MG PO TABS
25.0000 mg | ORAL_TABLET | Freq: Every day | ORAL | Status: DC
  Administered 2014-04-10: 25 mg via ORAL
  Filled 2014-04-10 (×3): qty 1

## 2014-04-10 MED ORDER — ENOXAPARIN SODIUM 40 MG/0.4ML ~~LOC~~ SOLN
40.0000 mg | SUBCUTANEOUS | Status: DC
  Administered 2014-04-10 – 2014-04-11 (×2): 40 mg via SUBCUTANEOUS
  Filled 2014-04-10 (×2): qty 0.4

## 2014-04-10 MED ORDER — SODIUM CHLORIDE 0.9 % IV SOLN: INTRAVENOUS | Status: DC

## 2014-04-10 MED ORDER — PREGABALIN 75 MG PO CAPS
150.0000 mg | ORAL_CAPSULE | Freq: Every day | ORAL | Status: DC
  Administered 2014-04-10 – 2014-04-11 (×2): 150 mg via ORAL
  Filled 2014-04-10: qty 2

## 2014-04-10 MED ORDER — ALBUTEROL SULFATE (2.5 MG/3ML) 0.083% IN NEBU
2.5000 mg | INHALATION_SOLUTION | Freq: Four times a day (QID) | RESPIRATORY_TRACT | Status: DC | PRN
Start: 2014-04-10 — End: 2014-04-11

## 2014-04-10 MED ORDER — ONDANSETRON HCL 4 MG/2ML IJ SOLN
4.0000 mg | Freq: Four times a day (QID) | INTRAMUSCULAR | Status: DC | PRN
  Administered 2014-04-10 (×2): 4 mg via INTRAVENOUS

## 2014-04-10 MED ORDER — INSULIN ASPART 100 UNIT/ML ~~LOC~~ SOLN
0.0000 [IU] | Freq: Three times a day (TID) | SUBCUTANEOUS | Status: DC
  Administered 2014-04-10: 2 [IU] via SUBCUTANEOUS
  Administered 2014-04-10: 3 [IU] via SUBCUTANEOUS
  Administered 2014-04-10 – 2014-04-11 (×2): 2 [IU] via SUBCUTANEOUS

## 2014-04-10 MED ORDER — DILTIAZEM HCL ER COATED BEADS 120 MG PO CP24
120.0000 mg | ORAL_CAPSULE | Freq: Every day | ORAL | Status: DC
  Administered 2014-04-10 – 2014-04-11 (×2): 120 mg via ORAL
  Filled 2014-04-10 (×2): qty 1

## 2014-04-10 MED ORDER — CARVEDILOL 25 MG PO TABS
25.0000 mg | ORAL_TABLET | Freq: Two times a day (BID) | ORAL | Status: DC
  Administered 2014-04-10 – 2014-04-11 (×3): 25 mg via ORAL
  Filled 2014-04-10 (×7): qty 1

## 2014-04-10 MED ORDER — ACETAMINOPHEN 650 MG RE SUPP: 650.0000 mg | Freq: Four times a day (QID) | RECTAL | Status: DC | PRN

## 2014-04-10 MED ORDER — ASPIRIN 81 MG PO CHEW
81.0000 mg | CHEWABLE_TABLET | Freq: Every day | ORAL | Status: DC
  Administered 2014-04-10 – 2014-04-11 (×2): 81 mg via ORAL
  Filled 2014-04-10 (×2): qty 1

## 2014-04-10 MED ORDER — PANTOPRAZOLE SODIUM 40 MG PO TBEC
40.0000 mg | DELAYED_RELEASE_TABLET | Freq: Every day | ORAL | Status: DC
  Administered 2014-04-10 – 2014-04-11 (×2): 40 mg via ORAL
  Filled 2014-04-10: qty 1

## 2014-04-10 MED ORDER — IRBESARTAN 150 MG PO TABS
150.0000 mg | ORAL_TABLET | Freq: Every day | ORAL | Status: DC
  Administered 2014-04-10 – 2014-04-11 (×2): 150 mg via ORAL
  Filled 2014-04-10 (×3): qty 1

## 2014-04-10 MED ORDER — LEVOTHYROXINE SODIUM 50 MCG PO TABS
50.0000 ug | ORAL_TABLET | Freq: Every day | ORAL | Status: DC
  Administered 2014-04-10 – 2014-04-11 (×2): 50 ug via ORAL
  Filled 2014-04-10 (×5): qty 1

## 2014-04-10 MED ORDER — BUDESONIDE 0.25 MG/2ML IN SUSP
0.2500 mg | Freq: Every day | RESPIRATORY_TRACT | Status: DC
  Administered 2014-04-11: 0.25 mg via RESPIRATORY_TRACT
  Filled 2014-04-10 (×2): qty 2

## 2014-04-10 MED ORDER — POTASSIUM CHLORIDE CRYS ER 20 MEQ PO TBCR
40.0000 meq | EXTENDED_RELEASE_TABLET | Freq: Once | ORAL | Status: AC
  Administered 2014-04-10: 40 meq via ORAL

## 2014-04-10 MED ORDER — SODIUM CHLORIDE 0.9 % IV SOLN
INTRAVENOUS | Status: AC
  Administered 2014-04-10 (×2): via INTRAVENOUS

## 2014-04-10 MED ORDER — ONDANSETRON HCL 4 MG PO TABS: 4.0000 mg | ORAL_TABLET | Freq: Four times a day (QID) | ORAL | Status: DC | PRN

## 2014-04-10 MED ORDER — ONDANSETRON HCL 4 MG/2ML IJ SOLN: 4.0000 mg | Freq: Three times a day (TID) | INTRAMUSCULAR | Status: DC | PRN

## 2014-04-10 MED ORDER — VITAMIN D 1000 UNITS PO TABS
2000.0000 [IU] | ORAL_TABLET | Freq: Every day | ORAL | Status: DC
Start: 2014-04-10 — End: 2014-04-11
  Administered 2014-04-10 – 2014-04-11 (×2): 2000 [IU] via ORAL
  Filled 2014-04-10 (×2): qty 2

## 2014-04-10 MED ORDER — INSULIN DETEMIR 100 UNIT/ML FLEXPEN: 15.0000 [IU] | PEN_INJECTOR | Freq: Two times a day (BID) | SUBCUTANEOUS | Status: DC

## 2014-04-10 MED ORDER — INSULIN DETEMIR 100 UNIT/ML ~~LOC~~ SOLN
15.0000 [IU] | Freq: Two times a day (BID) | SUBCUTANEOUS | Status: DC
  Administered 2014-04-10 (×2): 15 [IU] via SUBCUTANEOUS
  Filled 2014-04-10 (×4): qty 0.15

## 2014-04-10 MED ORDER — ACETAMINOPHEN 325 MG PO TABS
650.0000 mg | ORAL_TABLET | Freq: Four times a day (QID) | ORAL | Status: DC | PRN
  Administered 2014-04-10: 650 mg via ORAL

## 2014-04-10 MED ORDER — SIMVASTATIN 20 MG PO TABS
20.0000 mg | ORAL_TABLET | Freq: Every day | ORAL | Status: DC
  Administered 2014-04-10: 20 mg via ORAL
  Filled 2014-04-10 (×3): qty 1

## 2014-04-10 NOTE — Progress Notes (Signed)
I have seen and assessed patient and agree with Dr. Katherene PontoKakrakandy's assessment and plan. Patient presented with nausea vomiting and diarrhea likely a viral gastroenteritis. C. difficile PCR is negative. Continue IV fluids. Continue supportive care.

## 2014-04-10 NOTE — H&P (Signed)
[facility] History and Physical  Casey Harrell ZOX:[id number] DOB: [birth year] DOA: 04/09/2014  Referring physician: ER physician. Patient was transferred from [facility] [place]. PCP: [name], MD  Chief Complaint: Nausea vomiting and diarrhea.  HPI: Casey Harrell is a 79 y.o. female with history of diabetes mellitus hypertension hypothyroidism has been experiencing nausea vomiting and diarrhea over the last 4 days. Patient denies any sick contacts or recent travel. Patient also has been having crampy abdominal pain. Patient was having multiple episodes of nausea and vomiting over the last 24 hours the patient was unable to keep anything. In the ER patient's initial labs showed hypercalcemia which improved with hydration. CT abdomen and pelvis was unremarkable. Patient has been admitted for dehydration and nausea vomiting and diarrhea probably from viral gastroenteritis. Patient is afebrile.   Review of Systems: As presented in the history of presenting illness, rest negative.  Past Medical History  Diagnosis Date  . DIABETES-TYPE 2 07/15/2008  . DIABETIC PERIPHERAL NEUROPATHY 09/02/2009  . HYPERTENSION 07/15/2008  . ASTHMA 07/15/2008  . GERD 07/15/2008  . PVD (peripheral vascular disease) 06/18/09    RCE  . Normal cardiac stress test     low risk Nuc 2009, Feb 2011  . COPD (chronic obstructive pulmonary disease)    Past Surgical History  Procedure Laterality Date  . Appendectomy  1971  . Abdominal hysterectomy  1971  . Tonsillectomy    . Tubal ligation    . Cesarean section    . Flexible sigmoidoscopy      diverticulitis  . Laparotomy      X 3 with adhesions lysis  . Foot surgery      right foot X 2  . Carotid endarterectomy  06/18/09    RCE   Social History:  reports that she has never smoked. She has never used smokeless tobacco. She reports that she does not drink alcohol. Her drug history is not on file. Where does patient live home. Can patient participate in  ADLs? Yes.  Allergies  Allergen Reactions  . Doxycycline Hyclate Nausea And Vomiting    REACTION: vomiting  . Iohexol      Code: HIVES, Desc: pt. states she breaks out in hives 06/15/08   . Morphine Sulfate     REACTION: hallucinations  . Moxifloxacin     REACTION: unknown    Family History:  Family History  Problem Relation Age of Onset  . Diabetes Mother   . Arthritis Mother   . Heart disease Mother   . Heart disease Father   . Celiac disease Sister   . Stroke Maternal Grandfather   . Cancer Paternal Grandfather     lung  . Heart disease Sister   . Heart disease Sister   . Asthma Sister   . Heart disease Brother   . Cancer Brother 60    lung cancer      Prior to Admission medications   Medication Sig Start Date End Date Taking? Authorizing Provider  aspirin 81 MG tablet Take 81 mg by mouth daily.     Yes Historical Provider, MD  Calcium Citrate (CITRACAL PO) Take 1 tablet by mouth 2 (two) times daily.    Yes Historical Provider, MD  carvedilol (COREG) 25 MG tablet Take 1 tablet (25 mg total) by mouth 2 (two) times daily with a meal. 12/09/13  Yes Runell Gess, MD  Cholecalciferol (VITAMIN D3) 2000 UNITS TABS Take 2,000 Units by mouth daily.    Yes Historical  Provider, MD  albuterol (ACCUNEB) 1.25 MG/3ML nebulizer solution Take 1 ampule by nebulization every 6 (six) hours as needed for wheezing.    Historical Provider, MD  amitriptyline (ELAVIL) 25 MG tablet Take 1 tablet (25 mg total) by mouth at bedtime. 12/15/13   [name], MD  B-D ULTRAFINE III SHORT PEN 31G X 8 MM MISC USE AS DIRECTED 09/04/13   [name], MD  budesonide (PULMICORT) 0.25 MG/2ML nebulizer solution Take 2 mLs (0.25 mg total) by nebulization daily. J44.9 03/17/14   [name], MD  CLEVER CHEK AUTO-CODE VOICE test strip  05/07/11   Historical Provider, MD  diltiazem (CARDIZEM CD) 120 MG 24 hr capsule Take 1 capsule (120 mg total) by mouth daily. 12/09/13   Runell Gess, MD   DULoxetine (CYMBALTA) 60 MG capsule TAKE 1 CAPSULE BY MOUTH DAILY 12/15/13   [name], MD  insulin aspart (NOVOLOG FLEXPEN) 100 UNIT/ML FlexPen Inject 9-14 units into the skin 2 (two) daily with a meal. 9 units "Before" breakfast and 14 units "After" supper 10/17/13   [name], MD  Insulin Detemir (LEVEMIR FLEXTOUCH) 100 UNIT/ML Pen Inject 36 Units into the skin 2 (two) times daily. 03/16/14   [name], MD  Lancets MISC  05/07/11   Historical Provider, MD  levothyroxine (SYNTHROID, LEVOTHROID) 50 MCG tablet Take 1 tablet (50 mcg total) by mouth daily before breakfast. 12/15/13   [name], MD  magnesium oxide (MAG-OX) 400 (241.3 MG) MG tablet TAKE 1 TABLET BY MOUTH DAILY    [name], MD  metFORMIN (GLUCOPHAGE) 1000 MG tablet Take 1 tablet (1,000 mg total) by mouth 2 (two) times daily with a meal. 12/16/13   [name], MD  olmesartan (BENICAR) 20 MG tablet Take 1 tablet (20 mg total) by mouth daily. 12/16/13   [name], MD  ondansetron (ZOFRAN ODT) 4 MG disintegrating tablet Take 1 tablet (4 mg total) by mouth every 8 (eight) hours as needed for nausea or vomiting. 04/09/14   Roxy Horseman, PA-C  pantoprazole (PROTONIX) 40 MG tablet TAKE 1 TABLET BY MOUTH TWICE DAILY 12/15/13   [name], MD  pregabalin (LYRICA) 150 MG capsule TAKE ONE CAPSULE BY MOUTH DAILY 12/16/13   [name], MD  promethazine (PHENERGAN) 12.5 MG tablet 1-2 tabs po q6h prn nausea 04/09/14   Jeoffrey Massed, MD  simvastatin (ZOCOR) 20 MG tablet TAKE 1 TABLET BY MOUTH AT BEDTIME 12/15/13   [name], MD    Physical Exam: Filed Vitals:   04/09/14 2044 04/09/14 2215 04/10/14 0052 04/10/14 0141  BP: 161/77 169/67 154/80 180/77  Pulse: 82 80 76 83  Temp: 98.6 F (37 C)  98.1 F (36.7 C) 97.8 F (36.6 C)  TempSrc: Oral  Oral Oral  Resp: 18 18 18 20   Height:    5' 1.5" (1.562 m)  Weight:    69.7 kg (153 lb 10.6 oz)  SpO2: 95% 97% 97% 97%      General:  Well-developed and nourished.  Eyes: Anicteric no pallor.  ENT: No discharge from the ears eyes nose more.  Neck: No mass felt.  Cardiovascular: S1 and S2 heard.  Respiratory: No rhonchi or crepitations.  Abdomen: Soft nontender bowel sounds present.  Skin: No rash.  Musculoskeletal: No edema.  Psychiatric: Appears normal.  Neurologic: Alert awake oriented to time place and person. Moves all extremities.  Labs on Admission:  Basic Metabolic Panel:  Recent Labs  Lab 04/09/14 1719 04/09/14 2223  NA 139 139  K 4.0 3.7  CL 96 101  CO2 20 21  GLUCOSE 180* 179*  BUN 22 19  CREATININE 1.10 1.00  CALCIUM 11.1* 9.7   Liver Function Tests:  Recent Labs Lab 04/09/14 1719  AST 44*  ALT 32  ALKPHOS 101  BILITOT 0.3  PROT 9.0*  ALBUMIN 4.5    Recent Labs Lab 04/09/14 1719  LIPASE 10*   No results for input(s): AMMONIA in the last 168 hours. CBC:  Recent Labs Lab 04/09/14 1719  WBC 12.4*  NEUTROABS 6.9  HGB 14.6  HCT 43.5  MCV 84.3  PLT 330   Cardiac Enzymes: No results for input(s): CKTOTAL, CKMB, CKMBINDEX, TROPONINI in the last 168 hours.  BNP (last 3 results) No results for input(s): PROBNP in the last 8760 hours. CBG:  Recent Labs Lab 04/09/14 1648  GLUCAP 172*    Radiological Exams on Admission: Ct Abdomen Pelvis Wo Contrast  04/09/2014   CLINICAL DATA:  Bilateral lower quadrant pain. Nausea and vomiting. Diarrhea.  EXAM: CT ABDOMEN AND PELVIS WITHOUT CONTRAST  TECHNIQUE: Multidetector CT imaging of the abdomen and pelvis was performed following the standard protocol without IV contrast.  COMPARISON:  08/23/2009  FINDINGS: Lower chest:  Unremarkable.  Hepatobiliary: Diffuse hepatic steatosis again noted. No liver mass visualized on this noncontrast study. Gallbladder is unremarkable.  Pancreas: No mass or inflammatory process visualized on this non-contrast exam.  Spleen:  Within normal limits in size.  Adrenal Glands:  No  mass identified.  Kidneys/Urinary tract: No evidence of urolithiasis or hydronephrosis.  Stomach/Bowel/Peritoneum:  Unremarkable.  Vascular/Lymphatic: No pathologically enlarged lymph nodes identified. No other significant abnormality identified.  Reproductive: Prior hysterectomy demonstrated. Adnexal regions are unremarkable.  Other:  None.  Musculoskeletal:  No suspicious bone lesions identified.  IMPRESSION: No acute findings.  Stable hepatic steatosis.   Electronically Signed   By: Myles Rosenthal M.D.   On: 04/09/2014 20:13   Dg Chest 2 View  04/09/2014   CLINICAL DATA:  Weakness, SOB, N/V/D. No chest pain. PMHx: Asthma, Diabetes, Nonsmoker.  EXAM: CHEST  2 VIEW  COMPARISON:  None.  FINDINGS: The heart size and mediastinal contours are within normal limits. Both lungs are clear. The visualized skeletal structures are unremarkable.  IMPRESSION: No active cardiopulmonary disease.   Electronically Signed   By: Elige Ko   On: 04/09/2014 20:29   Dg Chest 2 View  04/09/2014   CLINICAL DATA:  Fever, rapid breathing, nausea  EXAM: CHEST  2 VIEW  COMPARISON:  Chest x-ray of 02/03/2013 and CT chest of the same day  FINDINGS: No active infiltrate or effusion is seen. Mild peribronchial thickening may indicate bronchitis. Mediastinal and hilar contours are unremarkable. The heart is within normal limits in size. No bony abnormality is seen.  IMPRESSION: Mild peribronchial thickening.  No pneumonia.   Electronically Signed   By: Dwyane Dee M.D.   On: 04/09/2014 16:30    Assessment/Plan Active Problems:   Hypothyroidism   Essential hypertension   Dehydration   Nausea vomiting and diarrhea   Diabetes mellitus type 2, controlled   1. Nausea and vomiting and diarrhea - most likely from viral gastroenteritis. Check stool studies. I did give her gentle hydration at this time I Patient on clear liquid diet. 2. Diabetes mellitus type 2 - since patient is on clear liquids and the intake is not reliable I have  decrease patient's home dose of insulin. If patient is  reliably taking orally than change to home dose of insulin. Patient is on sliding scale coverage. 3. Hypertension - continue home medications. 4. Hyperlipidemia - continue home medications. 5. Hypothyroidism - on Synthroid. 6. Mild hypercalcemia initially which improved with hydration probably from dehydration.    Code Status: Full code.  Family Communication: Patient's husband at the bedside.  Disposition Plan: Admit to inpatient.    Antonisha Waskey N. [facility] Pager [phone].  If 7PM-7AM, please contact night-coverage [email or web address] Password [email or web address] 04/10/2014, 3:57 AM

## 2014-04-10 NOTE — Plan of Care (Signed)
Problem: Consults Goal: Skin Care Protocol Initiated - if Braden Score 18 or less If consults are not indicated, leave blank or document N/A Outcome: Not Applicable Date Met:  97/74/14

## 2014-04-10 NOTE — Plan of Care (Signed)
Problem: Phase I Progression Outcomes Goal: Voiding-avoid urinary catheter unless indicated Outcome: Completed/Met Date Met:  04/10/14     

## 2014-04-11 DIAGNOSIS — K529 Noninfective gastroenteritis and colitis, unspecified: Secondary | ICD-10-CM

## 2014-04-11 LAB — COMPREHENSIVE METABOLIC PANEL
ALT: 31 U/L (ref 0–35)
AST: 44 U/L — ABNORMAL HIGH (ref 0–37)
Albumin: 4.8 g/dL (ref 3.5–5.2)
Alkaline Phosphatase: 94 U/L (ref 39–117)
BUN: 20 mg/dL (ref 6–23)
CO2: 21 mEq/L (ref 19–32)
Calcium: 10.7 mg/dL — ABNORMAL HIGH (ref 8.4–10.5)
Chloride: 102 mEq/L (ref 96–112)
Creatinine, Ser: 1.3 mg/dL — ABNORMAL HIGH (ref 0.4–1.2)
GFR: 41.13 mL/min — ABNORMAL LOW (ref >60.00)
Glucose, Bld: 133 mg/dL — ABNORMAL HIGH (ref 70–99)
Potassium: 4.7 mEq/L (ref 3.5–5.1)
Sodium: 140 mEq/L (ref 135–145)
Total Bilirubin: 0.6 mg/dL (ref 0.2–1.2)
Total Protein: 8.3 g/dL (ref 6.0–8.3)

## 2014-04-11 LAB — BASIC METABOLIC PANEL
Anion gap: 17 — ABNORMAL HIGH (ref 5–15)
BUN: 9 mg/dL (ref 6–23)
CO2: 19 mEq/L (ref 19–32)
Calcium: 9.1 mg/dL (ref 8.4–10.5)
Chloride: 104 mEq/L (ref 96–112)
Creatinine, Ser: 0.73 mg/dL (ref 0.50–1.10)
GFR calc Af Amer: >90 mL/min (ref >90)
GFR calc non Af Amer: 81 mL/min — ABNORMAL LOW (ref >90)
Glucose, Bld: 158 mg/dL — ABNORMAL HIGH (ref 70–99)
Potassium: 4.2 mEq/L (ref 3.7–5.3)
Sodium: 140 mEq/L (ref 137–147)

## 2014-04-11 LAB — URINE CULTURE: Colony Count: 75000

## 2014-04-11 LAB — GLUCOSE, CAPILLARY: Glucose-Capillary: 160 mg/dL — ABNORMAL HIGH (ref 70–99)

## 2014-04-11 MED ORDER — INSULIN DETEMIR 100 UNIT/ML ~~LOC~~ SOLN
20.0000 [IU] | Freq: Two times a day (BID) | SUBCUTANEOUS | Status: DC
Start: 2014-04-11 — End: 2014-04-11
  Administered 2014-04-11: 20 [IU] via SUBCUTANEOUS
  Filled 2014-04-11 (×2): qty 0.2

## 2014-04-11 NOTE — Discharge Summary (Signed)
Physician Discharge Summary  Casey Harrell WUJ:[id number] DOB: 09-21-1934 DOA: 04/09/2014  PCP: Casey Covey, MD  Admit date: 04/09/2014 Discharge date: 04/11/2014  Time spent: 65 minutes  Recommendations for Outpatient Follow-up:  1. Follow-up with Casey Covey, MD in 1 week. On Follow up patient in need a basic metabolic profile done to follow-up on electrolytes and renal function.  Discharge Diagnoses:  Principal Problem:   Gastroenteritis, non-infectious Active Problems:   Nausea vomiting and diarrhea   Hypothyroidism   Dyslipidemia   Essential hypertension   GERD   Depression   Dehydration   Diabetes mellitus type 2, controlled   Hypokalemia   Discharge Condition: Stable and improved.  Diet recommendation: Carb modified diet  Filed Weights   04/10/14 0141 04/10/14 0410 04/11/14 0529  Weight: 69.7 kg (153 lb 10.6 oz) 69.7 kg (153 lb 10.6 oz) 66.5 kg (146 lb 9.7 oz)    History of present illness:  Casey Harrell is a 79 y.o. female with history of diabetes mellitus hypertension hypothyroidism has been experiencing nausea vomiting and diarrhea over the last 4 days. Patient denies any sick contacts or recent travel. Patient also has been having crampy abdominal pain. Patient was having multiple episodes of nausea and vomiting over the last 24 hours the patient was unable to keep anything. In the ER patient's initial labs showed hypercalcemia which improved with hydration. CT abdomen and pelvis was unremarkable. Patient has been admitted for dehydration and nausea vomiting and diarrhea probably from viral gastroenteritis. Patient is afebrile.    Hospital Course:  #1 nausea, vomiting, diarrhea/acute viral gastroenteritis Patient had presented with crampy abdominal pain multiple episodes of nausea and vomiting as well as diarrhea. Patient was admitted. CT of the abdomen and pelvis which was done was unremarkable. Patient was initially placed on clear liquids, IV  fluids,antiemetics. C. difficile PCR was checked which came back negative. Patient improved clinically and diet was advanced and she was tolerating tolerating a soft diet. Patient will be discharged home in stable and improved condition with follow-up with PCP in a week.  #2 type 2 diabetes Patient was initially placed on clear liquids on admission and her insulin doses decreased. As patient's diet was advanced insulin doses were increased. Patient's diabetes remained controlled during the hospitalization. Outpatient follow-up.  #3 dehydration Patient was hydrated IV fluids and was euvolemic by day of discharge.  #4 hypertension Patient was continued on her home regimen of antihypertensives.  #5 hyperlipidemia Patient was maintained on a statin.  #6 hypothyroidism Patient was maintained on a home dose Synthroid.  #7 mild hypercalcemia Felt to be secondary to dehydration. Improved with hydration and had resolved by day of discharge.    Procedures:  CT Abdomen/Pelvis 12//3/15  Consultations:  None  Discharge Exam: Filed Vitals:   04/11/14 1335  BP: 131/55  Pulse: 64  Temp: 98.1 F (36.7 C)  Resp: 17    General: NAD Cardiovascular: RRR Respiratory: CTAB  Discharge Instructions You were cared for by a hospitalist during your hospital stay. If you have any questions about your discharge medications or the care you received while you were in the hospital after you are discharged, you can call the unit and asked to speak with the hospitalist on call if the hospitalist that took care of you is not available. Once you are discharged, your primary care physician will handle any further medical issues. Please note that NO REFILLS for any discharge medications will be authorized once you are discharged, as it is  imperative that you return to your primary care physician (or establish a relationship with a primary care physician if you do not have one) for your aftercare needs so that  they can reassess your need for medications and monitor your lab values.  Discharge Instructions    Diet Carb Modified    Complete by:  As directed      Discharge instructions    Complete by:  As directed   Follow up with Casey Covey, MD in 1 week.     Increase activity slowly    Complete by:  As directed           Current Discharge Medication List    START taking these medications   Details  ondansetron (ZOFRAN ODT) 4 MG disintegrating tablet Take 1 tablet (4 mg total) by mouth every 8 (eight) hours as needed for nausea or vomiting. Qty: 10 tablet, Refills: 0      CONTINUE these medications which have NOT CHANGED   Details  albuterol (ACCUNEB) 1.25 MG/3ML nebulizer solution Take 1 ampule by nebulization every 6 (six) hours as needed for wheezing.    amitriptyline (ELAVIL) 25 MG tablet Take 1 tablet (25 mg total) by mouth at bedtime. Qty: 90 tablet, Refills: 3    aspirin 81 MG tablet Take 81 mg by mouth daily.      Calcium Citrate (CITRACAL PO) Take 1 tablet by mouth 2 (two) times daily.     Calcium Citrate (CITRACAL PO) Take by mouth.    carvedilol (COREG) 25 MG tablet Take 1 tablet (25 mg total) by mouth 2 (two) times daily with a meal. Qty: 180 tablet, Refills: 1    Cholecalciferol (VITAMIN D3) 2000 UNITS TABS Take 2,000 Units by mouth daily.     diltiazem (CARDIZEM CD) 120 MG 24 hr capsule Take 1 capsule (120 mg total) by mouth daily. Qty: 90 capsule, Refills: 1    DULoxetine (CYMBALTA) 60 MG capsule TAKE 1 CAPSULE BY MOUTH DAILY Qty: 90 capsule, Refills: 3    insulin aspart (NOVOLOG FLEXPEN) 100 UNIT/ML FlexPen Inject 9-14 units into the skin 2 (two) daily with a meal. 9 units "Before" breakfast and 14 units "After" supper Qty: 15 mL, Refills: 5    Insulin Detemir (LEVEMIR FLEXTOUCH) 100 UNIT/ML Pen Inject 36 Units into the skin 2 (two) times daily. Qty: 15 mL, Refills: 5    levothyroxine (SYNTHROID, LEVOTHROID) 50 MCG tablet Take 1 tablet (50 mcg total)  by mouth daily before breakfast. Qty: 90 tablet, Refills: 3    magnesium oxide (MAG-OX) 400 (241.3 MG) MG tablet TAKE 1 TABLET BY MOUTH DAILY Qty: 30 tablet, Refills: 3    metFORMIN (GLUCOPHAGE) 1000 MG tablet Take 1 tablet (1,000 mg total) by mouth 2 (two) times daily with a meal. Qty: 180 tablet, Refills: 3    olmesartan (BENICAR) 20 MG tablet Take 1 tablet (20 mg total) by mouth daily. Qty: 90 tablet, Refills: 2    pantoprazole (PROTONIX) 40 MG tablet TAKE 1 TABLET BY MOUTH TWICE DAILY Qty: 180 tablet, Refills: 3    pregabalin (LYRICA) 150 MG capsule TAKE ONE CAPSULE BY MOUTH DAILY Qty: 90 capsule, Refills: 1    promethazine (PHENERGAN) 12.5 MG tablet 1-2 tabs po q6h prn nausea Qty: 30 tablet, Refills: 1    simvastatin (ZOCOR) 20 MG tablet TAKE 1 TABLET BY MOUTH AT BEDTIME Qty: 90 tablet, Refills: 3    B-D ULTRAFINE III SHORT PEN 31G X 8 MM MISC USE AS DIRECTED Qty: 100 each,  Refills: 1    CLEVER CHEK AUTO-CODE VOICE test strip     Lancets MISC       STOP taking these medications     budesonide (PULMICORT) 0.25 MG/2ML nebulizer solution        Allergies  Allergen Reactions  . Doxycycline Hyclate Nausea And Vomiting    REACTION: vomiting  . Iohexol      Code: HIVES, Desc: pt. states she breaks out in hives 06/15/08   . Morphine Sulfate     REACTION: hallucinations  . Moxifloxacin     REACTION: unknown   Follow-up Information    Follow up with Casey Covey, MD. Schedule an appointment as soon as possible for a visit in 1 week.   Specialty:  Family Medicine   Contact information:   [address] [place] [address] [phone]        The results of significant diagnostics from this hospitalization (including imaging, microbiology, ancillary and laboratory) are listed below for reference.    Significant Diagnostic Studies: Ct Abdomen Pelvis Wo Contrast  04/09/2014   CLINICAL DATA:  Bilateral lower quadrant pain. Nausea and vomiting.  Diarrhea.  EXAM: CT ABDOMEN AND PELVIS WITHOUT CONTRAST  TECHNIQUE: Multidetector CT imaging of the abdomen and pelvis was performed following the standard protocol without IV contrast.  COMPARISON:  08/23/2009  FINDINGS: Lower chest:  Unremarkable.  Hepatobiliary: Diffuse hepatic steatosis again noted. No liver mass visualized on this noncontrast study. Gallbladder is unremarkable.  Pancreas: No mass or inflammatory process visualized on this non-contrast exam.  Spleen:  Within normal limits in size.  Adrenal Glands:  No mass identified.  Kidneys/Urinary tract: No evidence of urolithiasis or hydronephrosis.  Stomach/Bowel/Peritoneum:  Unremarkable.  Vascular/Lymphatic: No pathologically enlarged lymph nodes identified. No other significant abnormality identified.  Reproductive: Prior hysterectomy demonstrated. Adnexal regions are unremarkable.  Other:  None.  Musculoskeletal:  No suspicious bone lesions identified.  IMPRESSION: No acute findings.  Stable hepatic steatosis.   Electronically Signed   By: Myles Rosenthal M.D.   On: 04/09/2014 20:13   Dg Chest 2 View  04/09/2014   CLINICAL DATA:  Weakness, SOB, N/V/D. No chest pain. PMHx: Asthma, Diabetes, Nonsmoker.  EXAM: CHEST  2 VIEW  COMPARISON:  None.  FINDINGS: The heart size and mediastinal contours are within normal limits. Both lungs are clear. The visualized skeletal structures are unremarkable.  IMPRESSION: No active cardiopulmonary disease.   Electronically Signed   By: Elige Ko   On: 04/09/2014 20:29   Dg Chest 2 View  04/09/2014   CLINICAL DATA:  Fever, rapid breathing, nausea  EXAM: CHEST  2 VIEW  COMPARISON:  Chest x-ray of 02/03/2013 and CT chest of the same day  FINDINGS: No active infiltrate or effusion is seen. Mild peribronchial thickening may indicate bronchitis. Mediastinal and hilar contours are unremarkable. The heart is within normal limits in size. No bony abnormality is seen.  IMPRESSION: Mild peribronchial thickening.  No pneumonia.    Electronically Signed   By: Dwyane Dee M.D.   On: 04/09/2014 16:30    Microbiology: Recent Results (from the past 240 hour(s))  Clostridium Difficile by PCR     Status: None   Collection Time: 04/09/14  9:32 PM  Result Value Ref Range Status   C difficile by pcr NEGATIVE NEGATIVE Final    Comment: Performed at [facility]     Labs: Basic Metabolic Panel:  Recent Labs Lab 04/09/14 1307 04/09/14 1719 04/09/14 2223 04/10/14 0637 04/11/14 0600  NA 140 139 139 139 140  K 4.7 4.0 3.7 3.4* 4.2  CL 102 96 101 101 104  CO2 21 20 21 20 19   GLUCOSE 133* 180* 179* 185* 158*  BUN 20 22 19 13 9   CREATININE 1.3* 1.10 1.00 0.79 0.73  CALCIUM 10.7* 11.1* 9.7 9.5 9.1   Liver Function Tests:  Recent Labs Lab 04/09/14 1307 04/09/14 1719 04/10/14 0637  AST 44* 44* 34  ALT 31 32 26  ALKPHOS 94 101 82  BILITOT 0.6 0.3 0.3  PROT 8.3 9.0* 7.4  ALBUMIN 4.8 4.5 3.9    Recent Labs Lab 04/09/14 1719  LIPASE 10*   No results for input(s): AMMONIA in the last 168 hours. CBC:  Recent Labs Lab 04/09/14 1307 04/09/14 1719 04/10/14 0637  WBC 12.7* 12.4* 10.3  NEUTROABS 6.5 6.9 5.3  HGB 15.1* 14.6 12.9  HCT 47.3* 43.5 38.9  MCV 89.0 84.3 84.9  PLT 342.0 330 284   Cardiac Enzymes: No results for input(s): CKTOTAL, CKMB, CKMBINDEX, TROPONINI in the last 168 hours. BNP: BNP (last 3 results) No results for input(s): PROBNP in the last 8760 hours. CBG:  Recent Labs Lab 04/10/14 0751 04/10/14 1225 04/10/14 1655 04/10/14 2111 04/11/14 0806  GLUCAP 196* 221* 217* 217* 160*       Signed:  Aydan Levitz MD [facility] 04/11/2014, 2:52 PM

## 2014-04-13 LAB — STOOL CULTURE

## 2014-04-17 ENCOUNTER — Encounter: Payer: Self-pay | Admitting: Family Medicine

## 2014-04-17 ENCOUNTER — Ambulatory Visit (INDEPENDENT_AMBULATORY_CARE_PROVIDER_SITE_OTHER): Payer: Medicare Other | Admitting: Family Medicine

## 2014-04-17 VITALS — BP 130/84 | HR 68 | Temp 97.7°F | Wt 152.0 lb

## 2014-04-17 DIAGNOSIS — N183 Chronic kidney disease, stage 3 unspecified: Secondary | ICD-10-CM

## 2014-04-17 DIAGNOSIS — IMO0002 Reserved for concepts with insufficient information to code with codable children: Secondary | ICD-10-CM

## 2014-04-17 DIAGNOSIS — K5289 Other specified noninfective gastroenteritis and colitis: Secondary | ICD-10-CM

## 2014-04-17 DIAGNOSIS — E1165 Type 2 diabetes mellitus with hyperglycemia: Secondary | ICD-10-CM

## 2014-04-17 LAB — BASIC METABOLIC PANEL
BUN: 23 mg/dL (ref 6–23)
CO2: 25 mEq/L (ref 19–32)
Calcium: 9.9 mg/dL (ref 8.4–10.5)
Chloride: 99 mEq/L (ref 96–112)
Creatinine, Ser: 0.9 mg/dL (ref 0.4–1.2)
GFR: 62.92 mL/min (ref >60.00)
Glucose, Bld: 131 mg/dL — ABNORMAL HIGH (ref 70–99)
Potassium: 5.1 mEq/L (ref 3.5–5.1)
Sodium: 130 mEq/L — ABNORMAL LOW (ref 135–145)

## 2014-04-17 NOTE — Progress Notes (Signed)
Subjective:    Patient ID: Casey Harrell, female    DOB: 1934/09/26, 79 y.o.   MRN: [id number]  HPI Patient seen for hospital follow-up. She was admitted on 4412- 3 and discharged on 12- 5. She presented with weakness, nausea, vomiting, and diarrhea. She was diagnosed with gastroenteritis. She was treated with supportive measures- IV fluids and improved. She had some initial hypercalcemia which improved with hydration. CT abdomen and pelvis unremarkable. She feels back to baseline this time. She had C. difficile PCR which was negative. Hypertension remained stable. She has type 2 diabetes on insulin. She states her blood sugars are running fairly high- fasting around 200. Currently takes Levemir 35 units twice daily and 16 units of short-acting insulin with each meal. Poor compliance with diet at times. Her GFR is around 50-55. She may have had previous pancreatitis per history and so GLP 1 class would not be an option. She's also had tendencies toward UTI in the past and so SGT2 inhibitors are not ideal  Past Medical History  Diagnosis Date  . DIABETES-TYPE 2 07/15/2008  . DIABETIC PERIPHERAL NEUROPATHY 09/02/2009  . HYPERTENSION 07/15/2008  . ASTHMA 07/15/2008  . GERD 07/15/2008  . PVD (peripheral vascular disease) 06/18/09    RCE  . Normal cardiac stress test     low risk Nuc 2009, Feb 2011  . COPD (chronic obstructive pulmonary disease)    Past Surgical History  Procedure Laterality Date  . Appendectomy  1971  . Abdominal hysterectomy  1971  . Tonsillectomy    . Tubal ligation    . Cesarean section    . Flexible sigmoidoscopy      diverticulitis  . Laparotomy      X 3 with adhesions lysis  . Foot surgery      right foot X 2  . Carotid endarterectomy  06/18/09    RCE    reports that she has never smoked. She has never used smokeless tobacco. She reports that she does not drink alcohol. Her drug history is not on file. family history includes Arthritis in her mother; Asthma in her  sister; Cancer in her paternal grandfather; Cancer (age of onset: 5160) in her brother; Celiac disease in her sister; Diabetes in her mother; Heart disease in her brother, father, mother, sister, and sister; Stroke in her maternal grandfather. Allergies  Allergen Reactions  . Doxycycline Hyclate Nausea And Vomiting    REACTION: vomiting  . Iohexol      Code: HIVES, Desc: pt. states she breaks out in hives 06/15/08   . Morphine Sulfate     REACTION: hallucinations  . Moxifloxacin     REACTION: unknown      Review of Systems  Constitutional: Negative for fatigue.  Eyes: Negative for visual disturbance.  Respiratory: Negative for cough, chest tightness, shortness of breath and wheezing.   Cardiovascular: Negative for chest pain, palpitations and leg swelling.  Endocrine: Negative for polydipsia and polyuria.  Neurological: Negative for dizziness, seizures, syncope, weakness, light-headedness and headaches.       Objective:   Physical Exam  Constitutional: She appears well-developed and well-nourished.  Cardiovascular: Normal rate and regular rhythm.   Pulmonary/Chest: Effort normal and breath sounds normal. No respiratory distress. She has no wheezes. She has no rales.  Abdominal: Soft. Bowel sounds are normal. She exhibits no distension. There is no tenderness. There is no rebound and no guarding.          Assessment & Plan:  #1 recent gastroenteritis.  Symptomatically improved. Recheck basic metabolic panel #2 type 2 diabetes recent poor control. Poor compliance with diet per her husband.  Titrate Levemir to 40 units twice daily. Continue mealtime insulin. Recheck A1c at follow-up in 2 months

## 2014-04-17 NOTE — Progress Notes (Signed)
Pre visit review using our clinic review tool, if applicable. No additional management support is needed unless otherwise documented below in the visit note. 

## 2014-04-28 ENCOUNTER — Other Ambulatory Visit: Payer: Self-pay | Admitting: *Deleted

## 2014-04-28 MED ORDER — INSULIN DETEMIR 100 UNIT/ML FLEXPEN: 36.0000 [IU] | PEN_INJECTOR | Freq: Two times a day (BID) | SUBCUTANEOUS | Status: DC

## 2014-05-05 ENCOUNTER — Other Ambulatory Visit: Payer: Self-pay | Admitting: *Deleted

## 2014-05-07 ENCOUNTER — Telehealth: Payer: Self-pay | Admitting: Family Medicine

## 2014-05-07 MED ORDER — INSULIN DETEMIR 100 UNIT/ML FLEXPEN: 40.0000 [IU] | PEN_INJECTOR | Freq: Two times a day (BID) | SUBCUTANEOUS | Status: DC

## 2014-05-07 MED ORDER — MECLIZINE HCL 12.5 MG PO TABS: 12.5000 mg | ORAL_TABLET | Freq: Four times a day (QID) | ORAL | Status: DC | PRN

## 2014-05-07 NOTE — Telephone Encounter (Signed)
Pt is having vertigo and would like med call into [facility]. Pt decline sat appt unless md will not call med into pharm. Pt would like levemir call into pharm also pt is on last pen and waiting for mailorder.

## 2014-05-07 NOTE — Telephone Encounter (Signed)
Pt informed. Rx sent to pharmacy  

## 2014-05-07 NOTE — Telephone Encounter (Signed)
She can try Meclizine 12.5 mg one every 6 hours prn #30

## 2014-05-13 ENCOUNTER — Telehealth: Payer: Self-pay | Admitting: Family Medicine

## 2014-05-13 MED ORDER — INSULIN DETEMIR 100 UNIT/ML FLEXPEN: 40.0000 [IU] | PEN_INJECTOR | Freq: Two times a day (BID) | SUBCUTANEOUS | Status: DC

## 2014-05-13 NOTE — Telephone Encounter (Signed)
Pt needs the rx Insulin Detemir (LEVEMIR FLEXTOUCH) 100 UNIT/ML Pen  Sent to [facility].  [facility] and pt does not use them for this rx.pt has a week left so pls send  Today is possible.  Pt states dr Caryl Neverburchette increased her dose to 40 units/ BID. That is why she ran out more quickly.

## 2014-05-13 NOTE — Telephone Encounter (Signed)
Rx sent to mail order

## 2014-05-19 ENCOUNTER — Telehealth: Payer: Self-pay | Admitting: Family Medicine

## 2014-05-19 NOTE — Telephone Encounter (Signed)
Calling to let you know that Levimir has been approved for 1 year from today's date.

## 2014-06-18 ENCOUNTER — Ambulatory Visit (INDEPENDENT_AMBULATORY_CARE_PROVIDER_SITE_OTHER): Payer: Medicare Other | Admitting: Family Medicine

## 2014-06-18 ENCOUNTER — Encounter: Payer: Self-pay | Admitting: Family Medicine

## 2014-06-18 VITALS — BP 130/72 | HR 66 | Temp 97.8°F | Wt 155.0 lb

## 2014-06-18 DIAGNOSIS — E038 Other specified hypothyroidism: Secondary | ICD-10-CM

## 2014-06-18 DIAGNOSIS — E1165 Type 2 diabetes mellitus with hyperglycemia: Secondary | ICD-10-CM

## 2014-06-18 DIAGNOSIS — IMO0002 Reserved for concepts with insufficient information to code with codable children: Secondary | ICD-10-CM

## 2014-06-18 DIAGNOSIS — I10 Essential (primary) hypertension: Secondary | ICD-10-CM

## 2014-06-18 MED ORDER — CANAGLIFLOZIN 100 MG PO TABS: 100.0000 mg | ORAL_TABLET | Freq: Every day | ORAL | Status: DC

## 2014-06-18 NOTE — Progress Notes (Signed)
Subjective:    Patient ID: Casey Harrell, female    DOB: 11/04/1934, 80 y.o.   MRN: [id number]  HPI Patient seen for follow-up regarding type 2 diabetes. History of poor control. She'll fairly high-dose of insulin with Levemir 40 mg twice daily and takes NovoLog 16 units twice daily. She states her fasting blood sugars are still frequently over 200. She does not bring in log today. She is not checking postprandial blood sugars or mini pre-prandial other than breakfast. Poor compliance with diet according the husband. She also remains on metformin. She's had previous pancreatitis so we have avoided GLP-1 class. We had briefly discussed options such as Invokana. We had impression she had frequent UTIs in the past but patient denies.  She has hypertension which is been fairly well controlled. Hypothyroidism on replacement. Compliant with medications. No recent dizziness. No chest pains  Past Medical History  Diagnosis Date  . DIABETES-TYPE 2 07/15/2008  . DIABETIC PERIPHERAL NEUROPATHY 09/02/2009  . HYPERTENSION 07/15/2008  . ASTHMA 07/15/2008  . GERD 07/15/2008  . PVD (peripheral vascular disease) 06/18/09    RCE  . Normal cardiac stress test     low risk Nuc 2009, Feb 2011  . COPD (chronic obstructive pulmonary disease)    Past Surgical History  Procedure Laterality Date  . Appendectomy  1971  . Abdominal hysterectomy  1971  . Tonsillectomy    . Tubal ligation    . Cesarean section    . Flexible sigmoidoscopy      diverticulitis  . Laparotomy      X 3 with adhesions lysis  . Foot surgery      right foot X 2  . Carotid endarterectomy  06/18/09    RCE    reports that she has never smoked. She has never used smokeless tobacco. She reports that she does not drink alcohol. Her drug history is not on file. family history includes Arthritis in her mother; Asthma in her sister; Cancer in her paternal grandfather; Cancer (age of onset: 1360) in her brother; Celiac disease in her sister; Diabetes  in her mother; Heart disease in her brother, father, mother, sister, and sister; Stroke in her maternal grandfather. Allergies  Allergen Reactions  . Doxycycline Hyclate Nausea And Vomiting    REACTION: vomiting  . Iohexol      Code: HIVES, Desc: pt. states she breaks out in hives 06/15/08   . Morphine Sulfate     REACTION: hallucinations  . Moxifloxacin     REACTION: unknown      Review of Systems  Constitutional: Positive for fatigue.  Eyes: Negative for visual disturbance.  Respiratory: Negative for cough, chest tightness, shortness of breath and wheezing.   Cardiovascular: Negative for chest pain, palpitations and leg swelling.  Endocrine: Negative for polydipsia and polyuria.  Neurological: Negative for dizziness, seizures, syncope, weakness, light-headedness and headaches.       Objective:   Physical Exam  Constitutional: She appears well-developed and well-nourished.  Cardiovascular: Normal rate and regular rhythm.   Pulmonary/Chest: Effort normal and breath sounds normal. No respiratory distress. She has no wheezes. She has no rales.  Musculoskeletal: She exhibits no edema.          Assessment & Plan:  Type 2 diabetes. Recent poor control. We explained to patient we need her to bring in readings so we can more effectively titrate her insulin. Recheck A1c today. Cautious trial of Invokana 100 mg once daily. Watch for UTI or yeast infection. Hypertension. Adequately  controlled. Continue current medication Hypothyroidism. She had recent TSH during recent hospitalization which was stable

## 2014-06-18 NOTE — Progress Notes (Signed)
Pre visit review using our clinic review tool, if applicable. No additional management support is needed unless otherwise documented below in the visit note. 

## 2014-06-18 NOTE — Patient Instructions (Signed)
Check occasional blood sugars 2 hours after meals.  If consistently > 180 then we need to consider slowly titrating up the Novolog.

## 2014-07-09 LAB — HM DIABETES EYE EXAM

## 2014-07-16 ENCOUNTER — Encounter: Payer: Self-pay | Admitting: Family Medicine

## 2014-09-10 ENCOUNTER — Telehealth: Payer: Self-pay | Admitting: Family Medicine

## 2014-09-10 MED ORDER — INSULIN DETEMIR 100 UNIT/ML FLEXPEN: 40.0000 [IU] | PEN_INJECTOR | Freq: Two times a day (BID) | SUBCUTANEOUS | Status: DC

## 2014-09-10 MED ORDER — PREGABALIN 150 MG PO CAPS: ORAL_CAPSULE | ORAL | Status: DC

## 2014-09-10 NOTE — Telephone Encounter (Signed)
Refill for 6 months. 

## 2014-09-10 NOTE — Telephone Encounter (Signed)
Lyrica  Last visit 06/18/14 Last refill 12/16/13 #90 1 refill

## 2014-09-10 NOTE — Telephone Encounter (Signed)
Sent Rx's to mail order.

## 2014-09-10 NOTE — Telephone Encounter (Signed)
Pt request refill  pregabalin (LYRICA) 150 MG capsule And Insulin Detemir (LEVEMIR FLEXTOUCH) 100 UNIT/ML Pen (pt states [facility] tod her to contact her dr- no refills) [facility]   Pt has fup appt 5/13

## 2014-09-15 ENCOUNTER — Other Ambulatory Visit (HOSPITAL_COMMUNITY): Payer: Self-pay | Admitting: Obstetrics & Gynecology

## 2014-09-16 LAB — CYTOLOGY - PAP

## 2014-09-18 ENCOUNTER — Ambulatory Visit (INDEPENDENT_AMBULATORY_CARE_PROVIDER_SITE_OTHER): Payer: Medicare Other | Admitting: Family Medicine

## 2014-09-18 ENCOUNTER — Encounter: Payer: Self-pay | Admitting: Family Medicine

## 2014-09-18 VITALS — BP 120/70 | HR 55 | Temp 98.1°F | Wt 149.0 lb

## 2014-09-18 DIAGNOSIS — E1165 Type 2 diabetes mellitus with hyperglycemia: Secondary | ICD-10-CM | POA: Diagnosis not present

## 2014-09-18 DIAGNOSIS — I10 Essential (primary) hypertension: Secondary | ICD-10-CM | POA: Diagnosis not present

## 2014-09-18 DIAGNOSIS — IMO0002 Reserved for concepts with insufficient information to code with codable children: Secondary | ICD-10-CM

## 2014-09-18 LAB — HEMOGLOBIN A1C
Hgb A1c MFr Bld: 9.1 % — ABNORMAL HIGH (ref <5.7)
Mean Plasma Glucose: 214 mg/dL — ABNORMAL HIGH (ref <117)

## 2014-09-18 MED ORDER — CANAGLIFLOZIN 300 MG PO TABS: 300.0000 mg | ORAL_TABLET | Freq: Every day | ORAL | Status: DC

## 2014-09-18 NOTE — Patient Instructions (Signed)
Go ahead and increase Invokana to 300 mg once daily Add Novolog insulin to your lunch meal Take some blood sugars 2 hours AFTER meals and record.

## 2014-09-18 NOTE — Progress Notes (Signed)
   Subjective:    Patient ID: Casey Harrell, female    DOB: 1934/12/31, 80 y.o.   MRN: [id number]  HPI Follow-up for poorly controlled type 2 diabetes. Last A1c 9.3%. According to her husband she has poor compliance with diet. She sometimes makes poor choices but also difficulty with portion control. She is on metformin, Levemir or, NovoLog and Invokana 100 mg daily. Fasting blood sugars been ranging 150-190.    Hypertension on multiple medications and generally controlled.  No headaches or dizziness.  No recent chest pains.  Compliant with therapy.  Past Medical History  Diagnosis Date  . DIABETES-TYPE 2 07/15/2008  . DIABETIC PERIPHERAL NEUROPATHY 09/02/2009  . HYPERTENSION 07/15/2008  . ASTHMA 07/15/2008  . GERD 07/15/2008  . PVD (peripheral vascular disease) 06/18/09    RCE  . Normal cardiac stress test     low risk Nuc 2009, Feb 2011  . COPD (chronic obstructive pulmonary disease)    Past Surgical History  Procedure Laterality Date  . Appendectomy  1971  . Abdominal hysterectomy  1971  . Tonsillectomy    . Tubal ligation    . Cesarean section    . Flexible sigmoidoscopy      diverticulitis  . Laparotomy      X 3 with adhesions lysis  . Foot surgery      right foot X 2  . Carotid endarterectomy  06/18/09    RCE    reports that she has never smoked. She has never used smokeless tobacco. She reports that she does not drink alcohol. Her drug history is not on file. family history includes Arthritis in her mother; Asthma in her sister; Cancer in her paternal grandfather; Cancer (age of onset: 6860) in her brother; Celiac disease in her sister; Diabetes in her mother; Heart disease in her brother, father, mother, sister, and sister; Stroke in her maternal grandfather. Allergies  Allergen Reactions  . Doxycycline Hyclate Nausea And Vomiting    REACTION: vomiting  . Iohexol      Code: HIVES, Desc: pt. states she breaks out in hives 06/15/08   . Morphine Sulfate     REACTION:  hallucinations  . Moxifloxacin     REACTION: unknown      Review of Systems  Constitutional: Positive for fatigue. Negative for appetite change and unexpected weight change.  Eyes: Negative for visual disturbance.  Respiratory: Negative for cough, chest tightness, shortness of breath and wheezing.   Cardiovascular: Negative for chest pain, palpitations and leg swelling.  Genitourinary: Negative for dysuria.  Neurological: Negative for dizziness, seizures, syncope, weakness, light-headedness and headaches.       Objective:   Physical Exam  Constitutional: She is oriented to person, place, and time. She appears well-developed and well-nourished.  Neck: Neck supple. No thyromegaly present.  Cardiovascular: Normal rate and regular rhythm.   Pulmonary/Chest: Effort normal and breath sounds normal. No respiratory distress. She has no wheezes. She has no rales.  Musculoskeletal: She exhibits no edema.  Neurological: She is alert and oriented to person, place, and time.          Assessment & Plan:  Type 2 diabetes. History of poor control and history of poor compliance. Recheck A1c. Increase Invokana to 300 mg once daily. Add NovoLog insulin to lunch meal. We've asked that she check both some preprandial blood sugars and 2 hour postprandial blood sugars to bring that for follow-up for review. Hypertension- controlled.

## 2014-09-18 NOTE — Progress Notes (Signed)
Pre visit review using our clinic review tool, if applicable. No additional management support is needed unless otherwise documented below in the visit note. 

## 2014-09-23 ENCOUNTER — Other Ambulatory Visit: Payer: Self-pay | Admitting: Cardiovascular Disease

## 2014-09-28 ENCOUNTER — Other Ambulatory Visit: Payer: Self-pay

## 2014-09-28 MED ORDER — DILTIAZEM HCL ER COATED BEADS 120 MG PO CP24: 120.0000 mg | ORAL_CAPSULE | Freq: Every day | ORAL | Status: DC

## 2014-09-28 NOTE — Telephone Encounter (Signed)
Rx(s) sent to pharmacy electronically.  

## 2014-09-30 ENCOUNTER — Other Ambulatory Visit: Payer: Self-pay | Admitting: *Deleted

## 2014-09-30 ENCOUNTER — Encounter: Payer: Self-pay | Admitting: *Deleted

## 2014-10-13 ENCOUNTER — Telehealth: Payer: Self-pay | Admitting: Family Medicine

## 2014-10-13 NOTE — Telephone Encounter (Signed)
PA for Benicar was denied.  Patient must try and fail 2 of their preferred medications:  Valsartan, losartan, telmisartan and olmesartan.  Patient has already tried olmesartan and now needs to try one more preferred.

## 2014-10-13 NOTE — Telephone Encounter (Signed)
Change to Valsartan 160 mg once daily.

## 2014-10-14 ENCOUNTER — Encounter: Payer: Self-pay | Admitting: Cardiovascular Disease

## 2014-10-14 MED ORDER — VALSARTAN 160 MG PO TABS: 160.0000 mg | ORAL_TABLET | Freq: Every day | ORAL | Status: DC

## 2014-10-14 NOTE — Telephone Encounter (Signed)
Rx sent to pharmacy. Pt is aware.  

## 2014-10-15 ENCOUNTER — Telehealth: Payer: Self-pay | Admitting: Family Medicine

## 2014-10-15 NOTE — Telephone Encounter (Signed)
We already answered this earlier in week.  Stop the Benicar and start Valsartan 160 mg once daily.

## 2014-10-15 NOTE — Telephone Encounter (Signed)
Pt call and said her insurance company will no longer pay for  [employer] . She is asking if there is something else she can take. Would like a call back

## 2014-10-16 NOTE — Telephone Encounter (Signed)
Patient informed. 

## 2014-11-02 ENCOUNTER — Encounter: Payer: Self-pay | Admitting: Internal Medicine

## 2014-11-02 ENCOUNTER — Ambulatory Visit (INDEPENDENT_AMBULATORY_CARE_PROVIDER_SITE_OTHER): Payer: Medicare Other | Admitting: Internal Medicine

## 2014-11-02 ENCOUNTER — Telehealth: Payer: Self-pay | Admitting: *Deleted

## 2014-11-02 VITALS — BP 144/80 | HR 82 | Temp 98.5°F | Resp 22 | Ht 61.5 in | Wt 150.0 lb

## 2014-11-02 DIAGNOSIS — R071 Chest pain on breathing: Secondary | ICD-10-CM | POA: Diagnosis not present

## 2014-11-02 DIAGNOSIS — E119 Type 2 diabetes mellitus without complications: Secondary | ICD-10-CM | POA: Diagnosis not present

## 2014-11-02 DIAGNOSIS — I10 Essential (primary) hypertension: Secondary | ICD-10-CM

## 2014-11-02 DIAGNOSIS — J449 Chronic obstructive pulmonary disease, unspecified: Secondary | ICD-10-CM

## 2014-11-02 DIAGNOSIS — R05 Cough: Secondary | ICD-10-CM | POA: Diagnosis not present

## 2014-11-02 DIAGNOSIS — R053 Chronic cough: Secondary | ICD-10-CM

## 2014-11-02 NOTE — Telephone Encounter (Signed)
Mr. Elmer Picker[facility] [place], called the office to inform us that he is treating Casey Harrell for pneumonia.  She came to his clinic today complaining of SOB and low grade fever.  Mr Dewar also confirmed that she had a low grade fever, elevated blood pressure, SOB.  He would like for her to follow up with her PCP and have an EKG because he is worried about something is going on with his heart.  Michelle informed.  FYI

## 2014-11-02 NOTE — Progress Notes (Signed)
Subjective:    Patient ID: Casey Harrell, female    DOB: 1934-12-16, 80 y.o.   MRN: [id number]  HPI 80 year old patient who has multiple medical problems including chronic bronchitis with asthma.  She does have home nebulizer available.  For the past 2 days she has developed cough associated with sinus congestion and low-grade fever.  She was seen at a medical clinic due to the unavailability of her PCP.  Associated symptoms included bilateral lateral chest wall pain aggravated by the cough.  The patient was referred to our office for consideration of an EKG due to the chest discomfort.  She has been treated with medicines for cough as well as antibiotic therapy.  Past Medical History  Diagnosis Date  . DIABETES-TYPE 2 07/15/2008  . DIABETIC PERIPHERAL NEUROPATHY 09/02/2009  . HYPERTENSION 07/15/2008  . ASTHMA 07/15/2008  . GERD 07/15/2008  . PVD (peripheral vascular disease) 06/18/09    RCE  . Normal cardiac stress test     low risk Nuc 2009, Feb 2011  . COPD (chronic obstructive pulmonary disease)     History   Social History  . Marital Status: Married    Spouse Name: N/A  . Number of Children: Y  . Years of Education: N/A   Occupational History  . stay at home    Social History Main Topics  . Smoking status: Never Smoker   . Smokeless tobacco: Never Used  . Alcohol Use: No  . Drug Use: Not on file  . Sexual Activity: Not on file   Other Topics Concern  . Not on file   Social History Narrative    Past Surgical History  Procedure Laterality Date  . Appendectomy  1971  . Abdominal hysterectomy  1971  . Tonsillectomy    . Tubal ligation    . Cesarean section    . Flexible sigmoidoscopy      diverticulitis  . Laparotomy      X 3 with adhesions lysis  . Foot surgery      right foot X 2  . Carotid endarterectomy  06/18/09    RCE    Family History  Problem Relation Age of Onset  . Diabetes Mother   . Arthritis Mother   . Heart disease Mother   . Heart disease  Father   . Celiac disease Sister   . Stroke Maternal Grandfather   . Cancer Paternal Grandfather     lung  . Heart disease Sister   . Heart disease Sister   . Asthma Sister   . Heart disease Brother   . Cancer Brother 60    lung cancer    Allergies  Allergen Reactions  . Doxycycline Hyclate Nausea And Vomiting    REACTION: vomiting  . Iohexol      Code: HIVES, Desc: pt. states she breaks out in hives 06/15/08   . Morphine Sulfate     REACTION: hallucinations  . Moxifloxacin     REACTION: unknown    Current Outpatient Prescriptions on File Prior to Visit  Medication Sig Dispense Refill  . albuterol (ACCUNEB) 1.25 MG/3ML nebulizer solution Take 1 ampule by nebulization every 6 (six) hours as needed for wheezing.    Marland Kitchen amitriptyline (ELAVIL) 25 MG tablet Take 1 tablet (25 mg total) by mouth at bedtime. 90 tablet 3  . aspirin 81 MG tablet Take 81 mg by mouth daily.      . B-D ULTRAFINE III SHORT PEN 31G X 8 MM MISC USE AS DIRECTED  100 each 1  . Calcium Citrate (CITRACAL PO) Take 1 tablet by mouth 2 (two) times daily.     . canagliflozin (INVOKANA) 300 MG TABS tablet Take 300 mg by mouth daily before breakfast. 30 tablet 11  . carvedilol (COREG) 25 MG tablet Take 1 tablet (25 mg total) by mouth 2 (two) times daily with a meal. 180 tablet 1  . Cholecalciferol (VITAMIN D3) 2000 UNITS TABS Take 2,000 Units by mouth daily.     Marland Kitchen CLEVER CHEK AUTO-CODE VOICE test strip     . diltiazem (CARTIA XT) 120 MG 24 hr capsule Take 1 capsule (120 mg total) by mouth daily. NEEDS APPOINTMENT FOR FUTURE REFILLS 30 capsule 0  . DULoxetine (CYMBALTA) 60 MG capsule TAKE 1 CAPSULE BY MOUTH DAILY 90 capsule 3  . insulin aspart (NOVOLOG FLEXPEN) 100 UNIT/ML FlexPen Inject 9-14 units into the skin 2 (two) daily with a meal. 9 units "Before" breakfast and 14 units "After" supper 15 mL 5  . Insulin Detemir (LEVEMIR FLEXTOUCH) 100 UNIT/ML Pen Inject 40 Units into the skin 2 (two) times daily. 15 mL 5  . Lancets  MISC     . levothyroxine (SYNTHROID, LEVOTHROID) 50 MCG tablet Take 1 tablet (50 mcg total) by mouth daily before breakfast. 90 tablet 3  . magnesium oxide (MAG-OX) 400 (241.3 MG) MG tablet TAKE 1 TABLET BY MOUTH DAILY 30 tablet 3  . meclizine (ANTIVERT) 12.5 MG tablet Take 1 tablet (12.5 mg total) by mouth every 6 (six) hours as needed for dizziness. 30 tablet 0  . metFORMIN (GLUCOPHAGE) 1000 MG tablet Take 1 tablet (1,000 mg total) by mouth 2 (two) times daily with a meal. 180 tablet 3  . ondansetron (ZOFRAN ODT) 4 MG disintegrating tablet Take 1 tablet (4 mg total) by mouth every 8 (eight) hours as needed for nausea or vomiting. 10 tablet 0  . pantoprazole (PROTONIX) 40 MG tablet TAKE 1 TABLET BY MOUTH TWICE DAILY 180 tablet 3  . pregabalin (LYRICA) 150 MG capsule TAKE ONE CAPSULE BY MOUTH DAILY 90 capsule 1  . promethazine (PHENERGAN) 12.5 MG tablet 1-2 tabs po q6h prn nausea 30 tablet 1  . simvastatin (ZOCOR) 20 MG tablet TAKE 1 TABLET BY MOUTH AT BEDTIME 90 tablet 3  . valsartan (DIOVAN) 160 MG tablet Take 1 tablet (160 mg total) by mouth daily. 30 tablet 1   No current facility-administered medications on file prior to visit.    BP 144/80 mmHg  Pulse 82  Temp(Src) 98.5 F (36.9 C) (Oral)  Resp 22  Ht 5' 1.5" (1.562 m)  Wt 150 lb (68.04 kg)  BMI 27.89 kg/m2  SpO2 86%      Review of Systems  Constitutional: Positive for activity change, appetite change and fatigue.  HENT: Negative for congestion, dental problem, hearing loss, rhinorrhea, sinus pressure, sore throat and tinnitus.   Eyes: Negative for pain, discharge and visual disturbance.  Respiratory: Positive for cough. Negative for shortness of breath.   Cardiovascular: Positive for chest pain. Negative for palpitations and leg swelling.  Gastrointestinal: Negative for nausea, vomiting, abdominal pain, diarrhea, constipation, blood in stool and abdominal distention.  Genitourinary: Negative for dysuria, urgency, frequency,  hematuria, flank pain, vaginal bleeding, vaginal discharge, difficulty urinating, vaginal pain and pelvic pain.  Musculoskeletal: Negative for joint swelling, arthralgias and gait problem.  Skin: Negative for rash.  Neurological: Negative for dizziness, syncope, speech difficulty, weakness, numbness and headaches.  Hematological: Negative for adenopathy.  Psychiatric/Behavioral: Negative for behavioral problems, dysphoric mood  and agitation. The patient is not nervous/anxious.        Objective:   Physical Exam  Constitutional: She is oriented to person, place, and time. She appears well-developed and well-nourished. No distress.  No acute distress Frequent paroxysms of cough, reproducing her chest pain  HENT:  Head: Normocephalic.  Right Ear: External ear normal.  Left Ear: External ear normal.  Mouth/Throat: Oropharynx is clear and moist.  Eyes: Conjunctivae and EOM are normal. Pupils are equal, round, and reactive to light.  Neck: Normal range of motion. Neck supple. No thyromegaly present.  Cardiovascular: Normal rate, regular rhythm, normal heart sounds and intact distal pulses.   Pulse rate 82  Pulmonary/Chest: Effort normal and breath sounds normal. No respiratory distress. She has no wheezes.  Bilateral coarse rhonchi O2 saturation 92%  No increased work of breathing   Abdominal: Soft. Bowel sounds are normal. She exhibits no mass. There is no tenderness.  Musculoskeletal: Normal range of motion.  Lymphadenopathy:    She has no cervical adenopathy.  Neurological: She is alert and oriented to person, place, and time.  Skin: Skin is warm and dry. No rash noted.  Psychiatric: She has a normal mood and affect. Her behavior is normal.          Assessment & Plan:   Exacerbation of COPD Chest wall pain secondary to the cough Diabetes mellitus, hypertension, stable  The patient has been asked to use her home nebulizer every 6-8 hours The patient has been prescribed  medicines for cough as well as antibiotic therapy  EKG reviewed.  No acute changes History of low risk nuclear stress tests in the past  We'll report any new or worsening symptoms

## 2014-11-02 NOTE — Progress Notes (Signed)
Pre visit review using our clinic review tool, if applicable. No additional management support is needed unless otherwise documented below in the visit note. 

## 2014-11-02 NOTE — Patient Instructions (Signed)
Acute bronchitis symptoms for less than 10 days are generally not helped by antibiotics.  Take over-the-counter expectorants and cough medications such as  Mucinex DM.  Call if there is no improvement in 5 to 7 days or if  you develop worsening cough, fever, or new symptoms, such as shortness of breath or chest pain.   HOME CARE INSTRUCTIONS  Get plenty of rest.  Drink enough fluids to keep your urine clear or pale yellow (unless you have a medical condition that requires fluid restriction). Increasing fluids may help thin your respiratory secretions (sputum) and reduce chest congestion, and it will prevent dehydration.  Take medicines only as directed by your health care provider.  If you were prescribed an antibiotic medicine, finish it all even if you start to feel better.  Avoid smoking and secondhand smoke. Exposure to cigarette smoke or irritating chemicals will make bronchitis worse. If you are a smoker, consider using nicotine gum or skin patches to help control withdrawal symptoms. Quitting smoking will help your lungs heal faster.  Reduce the chances of another bout of acute bronchitis by washing your hands frequently, avoiding people with cold symptoms, and trying not to touch your hands to your mouth, nose, or eyes.  Keep all follow-up visits as directed by your health care provider.   

## 2014-11-05 ENCOUNTER — Emergency Department (HOSPITAL_BASED_OUTPATIENT_CLINIC_OR_DEPARTMENT_OTHER)
Admission: EM | Admit: 2014-11-05 | Discharge: 2014-11-05 | Disposition: A | Discharge status: Home / Self Care | Payer: Medicare Other | Attending: Emergency Medicine | Admitting: Emergency Medicine

## 2014-11-05 ENCOUNTER — Encounter (HOSPITAL_BASED_OUTPATIENT_CLINIC_OR_DEPARTMENT_OTHER): Payer: Self-pay | Admitting: *Deleted

## 2014-11-05 ENCOUNTER — Emergency Department (HOSPITAL_BASED_OUTPATIENT_CLINIC_OR_DEPARTMENT_OTHER): Payer: Medicare Other

## 2014-11-05 ENCOUNTER — Telehealth: Payer: Self-pay | Admitting: *Deleted

## 2014-11-05 DIAGNOSIS — I10 Essential (primary) hypertension: Secondary | ICD-10-CM | POA: Diagnosis not present

## 2014-11-05 DIAGNOSIS — J441 Chronic obstructive pulmonary disease with (acute) exacerbation: Secondary | ICD-10-CM | POA: Insufficient documentation

## 2014-11-05 DIAGNOSIS — E1149 Type 2 diabetes mellitus with other diabetic neurological complication: Secondary | ICD-10-CM | POA: Insufficient documentation

## 2014-11-05 DIAGNOSIS — K219 Gastro-esophageal reflux disease without esophagitis: Secondary | ICD-10-CM | POA: Insufficient documentation

## 2014-11-05 DIAGNOSIS — R531 Weakness: Secondary | ICD-10-CM | POA: Insufficient documentation

## 2014-11-05 DIAGNOSIS — J189 Pneumonia, unspecified organism: Secondary | ICD-10-CM

## 2014-11-05 DIAGNOSIS — Z7982 Long term (current) use of aspirin: Secondary | ICD-10-CM | POA: Diagnosis not present

## 2014-11-05 DIAGNOSIS — Z794 Long term (current) use of insulin: Secondary | ICD-10-CM | POA: Diagnosis not present

## 2014-11-05 DIAGNOSIS — J159 Unspecified bacterial pneumonia: Secondary | ICD-10-CM | POA: Insufficient documentation

## 2014-11-05 DIAGNOSIS — Z79899 Other long term (current) drug therapy: Secondary | ICD-10-CM | POA: Diagnosis not present

## 2014-11-05 DIAGNOSIS — R197 Diarrhea, unspecified: Secondary | ICD-10-CM

## 2014-11-05 LAB — URINE MICROSCOPIC-ADD ON

## 2014-11-05 LAB — URINALYSIS, ROUTINE W REFLEX MICROSCOPIC
Bilirubin Urine: NEGATIVE
Glucose, UA: 500 mg/dL — AB
Hgb urine dipstick: NEGATIVE
Ketones, ur: NEGATIVE mg/dL
Nitrite: NEGATIVE
Protein, ur: NEGATIVE mg/dL
Specific Gravity, Urine: 1.004 — ABNORMAL LOW (ref 1.005–1.030)
Urobilinogen, UA: 0.2 mg/dL (ref 0.0–1.0)
pH: 6 (ref 5.0–8.0)

## 2014-11-05 LAB — COMPREHENSIVE METABOLIC PANEL
ALT: 23 U/L (ref 14–54)
AST: 31 U/L (ref 15–41)
Albumin: 3.7 g/dL (ref 3.5–5.0)
Alkaline Phosphatase: 88 U/L (ref 38–126)
Anion gap: 10 (ref 5–15)
BUN: 14 mg/dL (ref 6–20)
CO2: 24 mmol/L (ref 22–32)
Calcium: 8.6 mg/dL — ABNORMAL LOW (ref 8.9–10.3)
Chloride: 103 mmol/L (ref 101–111)
Creatinine, Ser: 0.85 mg/dL (ref 0.44–1.00)
GFR calc Af Amer: >60 mL/min (ref >60)
GFR calc non Af Amer: >60 mL/min (ref >60)
Glucose, Bld: 90 mg/dL (ref 65–99)
Potassium: 3.5 mmol/L (ref 3.5–5.1)
Sodium: 137 mmol/L (ref 135–145)
Total Bilirubin: 0.4 mg/dL (ref 0.3–1.2)
Total Protein: 7.7 g/dL (ref 6.5–8.1)

## 2014-11-05 LAB — CBC
HCT: 40.6 % (ref 36.0–46.0)
Hemoglobin: 12.8 g/dL (ref 12.0–15.0)
MCH: 26.4 pg (ref 26.0–34.0)
MCHC: 31.5 g/dL (ref 30.0–36.0)
MCV: 83.9 fL (ref 78.0–100.0)
Platelets: 261 10*3/uL (ref 150–400)
RBC: 4.84 MIL/uL (ref 3.87–5.11)
RDW: 16.3 % — ABNORMAL HIGH (ref 11.5–15.5)
WBC: 11.6 10*3/uL — ABNORMAL HIGH (ref 4.0–10.5)

## 2014-11-05 MED ORDER — AZITHROMYCIN 250 MG PO TABS: 250.0000 mg | ORAL_TABLET | Freq: Every day | ORAL | Status: DC

## 2014-11-05 MED ORDER — ALBUTEROL SULFATE (2.5 MG/3ML) 0.083% IN NEBU
5.0000 mg | INHALATION_SOLUTION | Freq: Once | RESPIRATORY_TRACT | Status: AC
  Administered 2014-11-05: 5 mg via RESPIRATORY_TRACT
  Filled 2014-11-05: qty 6

## 2014-11-05 MED ORDER — IPRATROPIUM BROMIDE 0.02 % IN SOLN
0.5000 mg | Freq: Once | RESPIRATORY_TRACT | Status: AC
  Administered 2014-11-05: 0.5 mg via RESPIRATORY_TRACT
  Filled 2014-11-05: qty 2.5

## 2014-11-05 MED ORDER — SODIUM CHLORIDE 0.9 % IV BOLUS (SEPSIS)
1000.0000 mL | Freq: Once | INTRAVENOUS | Status: AC
  Administered 2014-11-05: 1000 mL via INTRAVENOUS

## 2014-11-05 NOTE — ED Provider Notes (Signed)
CSN: [id number]     Arrival date & time 11/05/14  1403 History   First MD Initiated Contact with Patient 11/05/14 1425     Chief Complaint  Patient presents with  . Diarrhea     (Consider location/radiation/quality/duration/timing/severity/associated sxs/prior Treatment) HPI Comments: 80 year old female presenting with diarrhea 3 days. Reports multiple episodes of watery diarrhea. One week ago, she started having looser bowel movements. [facility] to go to the emergency room as she may need IV fluids. 3 days ago, she was seen by her PCP and started on Augmentin for a productive cough with green mucus. She's had 3 days worth of the Augmentin but did not take any today. States she continues to have a productive cough and occasional shortness of breath with coughing. Denies chest pain. Denies abdominal pain, nausea, vomiting or fever. No recent hospitalizations. No sick contacts or recent travel. States she is starting to feel weak and tired.  Patient is a 80 y.o. female presenting with diarrhea. The history is provided by the patient and the spouse.  Diarrhea   Past Medical History  Diagnosis Date  . DIABETES-TYPE 2 07/15/2008  . DIABETIC PERIPHERAL NEUROPATHY 09/02/2009  . HYPERTENSION 07/15/2008  . ASTHMA 07/15/2008  . GERD 07/15/2008  . PVD (peripheral vascular disease) 06/18/09    RCE  . Normal cardiac stress test     low risk Nuc 2009, Feb 2011  . COPD (chronic obstructive pulmonary disease)    Past Surgical History  Procedure Laterality Date  . Appendectomy  1971  . Abdominal hysterectomy  1971  . Tonsillectomy    . Tubal ligation    . Cesarean section    . Flexible sigmoidoscopy      diverticulitis  . Laparotomy      X 3 with adhesions lysis  . Foot surgery      right foot X 2  . Carotid endarterectomy  06/18/09    RCE   Family History  Problem Relation Age of Onset  . Diabetes Mother   . Arthritis Mother   . Heart disease Mother    . Heart disease Father   . Celiac disease Sister   . Stroke Maternal Grandfather   . Cancer Paternal Grandfather     lung  . Heart disease Sister   . Heart disease Sister   . Asthma Sister   . Heart disease Brother   . Cancer Brother 60    lung cancer   History  Substance Use Topics  . Smoking status: Never Smoker   . Smokeless tobacco: Never Used  . Alcohol Use: No   OB History    No data available     Review of Systems  Constitutional: Positive for fatigue.  Respiratory: Positive for cough and shortness of breath (with coughing).   Gastrointestinal: Positive for diarrhea.  Neurological: Positive for weakness.  All other systems reviewed and are negative.     Allergies  Doxycycline hyclate; Iohexol; Morphine sulfate; and Moxifloxacin  Home Medications   Prior to Admission medications   Medication Sig Start Date End Date Taking? Authorizing Provider  albuterol (ACCUNEB) 1.25 MG/3ML nebulizer solution Take 1 ampule by nebulization every 6 (six) hours as needed for wheezing.    Historical Provider, MD  amitriptyline (ELAVIL) 25 MG tablet Take 1 tablet (25 mg total) by mouth at bedtime. 12/15/13   [name], MD  aspirin 81 MG tablet Take 81 mg by mouth daily.  Historical Provider, MD  azithromycin (ZITHROMAX) 250 MG tablet Take 1 tablet (250 mg total) by mouth daily. Take first 2 tablets together, then 1 every day until finished. 11/05/14   Reianna Batdorf M Deaunte Dente, PA-C  B-D ULTRAFINE III SHORT PEN 31G X 8 MM MISC USE AS DIRECTED 09/04/13   Kristian CoveyBruce W Burchette, MD  Calcium Citrate (CITRACAL PO) Take 1 tablet by mouth 2 (two) times daily.     Historical Provider, MD  canagliflozin (INVOKANA) 300 MG TABS tablet Take 300 mg by mouth daily before breakfast. 09/18/14   Kristian CoveyBruce W Burchette, MD  carvedilol (COREG) 25 MG tablet Take 1 tablet (25 mg total) by mouth 2 (two) times daily with a meal. 12/09/13   Runell GessJonathan J Berry, MD  Cholecalciferol (VITAMIN D3) 2000 UNITS TABS Take 2,000  Units by mouth daily.     Historical Provider, MD  CLEVER CHEK AUTO-CODE VOICE test strip  05/07/11   Historical Provider, MD  diltiazem (CARTIA XT) 120 MG 24 hr capsule Take 1 capsule (120 mg total) by mouth daily. NEEDS APPOINTMENT FOR FUTURE REFILLS 09/28/14   Runell GessJonathan J Berry, MD  DULoxetine (CYMBALTA) 60 MG capsule TAKE 1 CAPSULE BY MOUTH DAILY 12/15/13   Kristian CoveyBruce W Burchette, MD  insulin aspart (NOVOLOG FLEXPEN) 100 UNIT/ML FlexPen Inject 9-14 units into the skin 2 (two) daily with a meal. 9 units "Before" breakfast and 14 units "After" supper 10/17/13   Kristian CoveyBruce W Burchette, MD  Insulin Detemir (LEVEMIR FLEXTOUCH) 100 UNIT/ML Pen Inject 40 Units into the skin 2 (two) times daily. 09/10/14   Kristian CoveyBruce W Burchette, MD  Lancets MISC  05/07/11   Historical Provider, MD  levothyroxine (SYNTHROID, LEVOTHROID) 50 MCG tablet Take 1 tablet (50 mcg total) by mouth daily before breakfast. 12/15/13   Kristian CoveyBruce W Burchette, MD  magnesium oxide (MAG-OX) 400 (241.3 MG) MG tablet TAKE 1 TABLET BY MOUTH DAILY    Kristian CoveyBruce W Burchette, MD  meclizine (ANTIVERT) 12.5 MG tablet Take 1 tablet (12.5 mg total) by mouth every 6 (six) hours as needed for dizziness. 05/07/14   Kristian CoveyBruce W Burchette, MD  metFORMIN (GLUCOPHAGE) 1000 MG tablet Take 1 tablet (1,000 mg total) by mouth 2 (two) times daily with a meal. 12/16/13   Kristian CoveyBruce W Burchette, MD  ondansetron (ZOFRAN ODT) 4 MG disintegrating tablet Take 1 tablet (4 mg total) by mouth every 8 (eight) hours as needed for nausea or vomiting. 04/09/14   Roxy Horsemanobert Browning, PA-C  pantoprazole (PROTONIX) 40 MG tablet TAKE 1 TABLET BY MOUTH TWICE DAILY 12/15/13   Kristian CoveyBruce W Burchette, MD  pregabalin (LYRICA) 150 MG capsule TAKE ONE CAPSULE BY MOUTH DAILY 09/10/14   Kristian CoveyBruce W Burchette, MD  promethazine (PHENERGAN) 12.5 MG tablet 1-2 tabs po q6h prn nausea 04/09/14   Jeoffrey MassedPhilip H McGowen, MD  simvastatin (ZOCOR) 20 MG tablet TAKE 1 TABLET BY MOUTH AT BEDTIME 12/15/13   Kristian CoveyBruce W Burchette, MD  valsartan (DIOVAN) 160 MG tablet  Take 1 tablet (160 mg total) by mouth daily. 10/14/14   Kristian CoveyBruce W Burchette, MD   BP 146/72 mmHg  Pulse 85  Temp(Src) 97.7 F (36.5 C) (Oral)  Resp 18  Ht 5\' 1"  (1.549 m)  Wt 143 lb (64.864 kg)  BMI 27.03 kg/m2  SpO2 95% Physical Exam  Constitutional: She is oriented to person, place, and time. She appears well-developed and well-nourished. No distress.  HENT:  Head: Normocephalic and atraumatic.  Mouth/Throat: Oropharynx is clear and moist.  Eyes: Conjunctivae and EOM are normal.  Neck: Normal  range of motion. Neck supple.  Cardiovascular: Normal rate, regular rhythm and normal heart sounds.   Pulmonary/Chest: Effort normal. No respiratory distress.  Diffuse inspiratory/expiratory wheezes and ronchi bilateral.  Abdominal: Soft. Bowel sounds are normal. She exhibits no distension.  Mild suprapubic tenderness. No peritoneal signs.  Musculoskeletal: Normal range of motion. She exhibits no edema.  Neurological: She is alert and oriented to person, place, and time. No sensory deficit.  Skin: Skin is warm and dry.  Psychiatric: She has a normal mood and affect. Her behavior is normal.  Nursing note and vitals reviewed.   ED Course  Procedures (including critical care time) Labs Review Labs Reviewed  URINALYSIS, ROUTINE W REFLEX MICROSCOPIC (NOT AT [facility]) - Abnormal; Notable for the following:    Specific Gravity, Urine 1.004 (*)    Glucose, UA 500 (*)    Leukocytes, UA MODERATE (*)    All other components within normal limits  CBC - Abnormal; Notable for the following:    WBC 11.6 (*)    RDW 16.3 (*)    All other components within normal limits  COMPREHENSIVE METABOLIC PANEL - Abnormal; Notable for the following:    Calcium 8.6 (*)    All other components within normal limits  CLOSTRIDIUM DIFFICILE BY PCR (NOT AT [facility])  URINE CULTURE  URINE MICROSCOPIC-ADD ON    Imaging Review Dg Chest 2 View  11/05/2014   CLINICAL DATA:  80 year old female with a history of shortness of  breath. Pneumonia.  EXAM: CHEST - 2 VIEW  COMPARISON:  04/09/2014, 02/03/2013, CT 02/03/2013  FINDINGS: Cardiomediastinal silhouette unchanged in size and contour.  Ill-defined opacity in the right hilar region, with thickening of the minor fissure.  No pneumothorax.  Coarse interstitial markings.  No large pleural effusion.  No displaced fracture.  Unremarkable appearance of the upper abdomen.  IMPRESSION: Airspace disease in the right hilar region with associated trace pleural fluid in the fissure. Findings are most likely related to shortness of breath given the history. Followup PA and lateral chest X-ray is recommended in 3-4 weeks following trial of antibiotic therapy to ensure resolution and exclude underlying malignancy.  Signed,  Yvone Neu. Loreta Ave, DO  [facility]  [facility]   Electronically Signed   By: Gilmer Mor D.O.   On: 11/05/2014 15:16     EKG Interpretation None      MDM   Final diagnoses:  CAP (community acquired pneumonia)  Diarrhea   Non-toxic appearing, NAD. AFVSS. Abdomen soft and non-tender. Diarrhea most likely from augmentin. CXR obtained given diffuse wheezes/ronchi and started on augmentin for clinical pneumonia. CXR results as stated above. Labs with a leukocytosis of 11.6, no other acute findings. UA is equivocal, culture pending. Given worsening diarrhea from Augmentin, will switch the patient to azithromycin. She is stable for outpatient treatment and does not require admission. O2 sat remains 95-97% on room air. Ambulates without difficulty. F/u with PCP within 1 week. Discussed dietary changes for diarrhea (BRAT). Stable for d/c. Return precautions given. Patient states understanding of treatment care plan and is agreeable.  Discussed with attending Dr. Fredderick Phenix who also evaluated patient and agrees with plan of care.   Kathrynn Speed, PA-C 11/05/14 1649  Rolan Bucco, MD 11/06/14 [phone]

## 2014-11-05 NOTE — ED Notes (Signed)
Attempt x2 for piv unsuccessful left anterior FA and right arm

## 2014-11-05 NOTE — Telephone Encounter (Signed)
Pt walked in today [facility] [place] and they sent her here to get IV fluids.  Triaged pt and she stated she had been taking amoxicillin and had 12-15 mins bowel movements since last night.  She stopped taking the amoxicillin yesterday and she did have diarrhea before being put on it but states it got worse after starting the amox.  Told pt that we did not do IV fluids here.  She needed to go to the ED.  [facility]  [facility].  Nothing further needed

## 2014-11-05 NOTE — ED Notes (Signed)
Patient transported to X-ray 

## 2014-11-05 NOTE — Discharge Instructions (Signed)
Take azithromycin as prescribed for the next 5 days. You do not need to take Augmentin anymore. Follow-up with your primary care physician within one week. Stay well-hydrated.  Food Choices to Help Relieve Diarrhea When you have diarrhea, the foods you eat and your eating habits are very important. Choosing the right foods and drinks can help relieve diarrhea. Also, because diarrhea can last up to 7 days, you need to replace lost fluids and electrolytes (such as sodium, potassium, and chloride) in order to help prevent dehydration.  WHAT GENERAL GUIDELINES DO I NEED TO FOLLOW?  Slowly drink 1 cup (8 oz) of fluid for each episode of diarrhea. If you are getting enough fluid, your urine will be clear or pale yellow.  Eat starchy foods. Some good choices include white rice, white toast, pasta, low-fiber cereal, baked potatoes (without the skin), saltine crackers, and bagels.  Avoid large servings of any cooked vegetables.  Limit fruit to two servings per day. A serving is  cup or 1 small piece.  Choose foods with less than 2 g of fiber per serving.  Limit fats to less than 8 tsp (38 g) per day.  Avoid fried foods.  Eat foods that have probiotics in them. Probiotics can be found in certain dairy products.  Avoid foods and beverages that may increase the speed at which food moves through the stomach and intestines (gastrointestinal tract). Things to avoid include:  High-fiber foods, such as dried fruit, raw fruits and vegetables, nuts, seeds, and whole grain foods.  Spicy foods and high-fat foods.  Foods and beverages sweetened with high-fructose corn syrup, honey, or sugar alcohols such as xylitol, sorbitol, and mannitol. WHAT FOODS ARE RECOMMENDED? Grains White rice. White, [place], or pita breads (fresh or toasted), including plain rolls, buns, or bagels. White pasta. Saltine, soda, or graham crackers. Pretzels. Low-fiber cereal. Cooked cereals made with water (such as cornmeal, farina,  or cream cereals). Plain muffins. Matzo. Melba toast. Zwieback.  Vegetables Potatoes (without the skin). Strained tomato and vegetable juices. Most well-cooked and canned vegetables without seeds. Tender lettuce. Fruits Cooked or canned applesauce, apricots, cherries, fruit cocktail, grapefruit, peaches, pears, or plums. Fresh bananas, apples without skin, cherries, grapes, cantaloupe, grapefruit, peaches, oranges, or plums.  Meat and Other Protein Products Baked or boiled chicken. Eggs. Tofu. Fish. Seafood. Smooth peanut butter. Ground or well-cooked tender beef, ham, veal, lamb, pork, or poultry.  Dairy Plain yogurt, kefir, and unsweetened liquid yogurt. Lactose-free milk, buttermilk, or soy milk. Plain hard cheese. Beverages Sport drinks. Clear broths. Diluted fruit juices (except prune). Regular, caffeine-free sodas such as ginger ale. Water. Decaffeinated teas. Oral rehydration solutions. Sugar-free beverages not sweetened with sugar alcohols. Other Bouillon, broth, or soups made from recommended foods.  The items listed above may not be a complete list of recommended foods or beverages. Contact your dietitian for more options. WHAT FOODS ARE NOT RECOMMENDED? Grains Whole grain, whole wheat, bran, or rye breads, rolls, pastas, crackers, and cereals. Wild or brown rice. Cereals that contain more than 2 g of fiber per serving. Corn tortillas or taco shells. Cooked or dry oatmeal. Granola. Popcorn. Vegetables Raw vegetables. Cabbage, broccoli, Brussels sprouts, artichokes, baked beans, beet greens, corn, kale, legumes, peas, sweet potatoes, and yams. Potato skins. Cooked spinach and cabbage. Fruits Dried fruit, including raisins and dates. Raw fruits. Stewed or dried prunes. Fresh apples with skin, apricots, mangoes, pears, raspberries, and strawberries.  Meat and Other Protein Products Chunky peanut butter. Nuts and seeds. Beans and lentils. Tomasa Blase.  Dairy High-fat cheeses. Milk, chocolate  milk, and beverages made with milk, such as milk shakes. Cream. Ice cream. Sweets and Desserts Sweet rolls, doughnuts, and sweet breads. Pancakes and waffles. Fats and Oils Butter. Cream sauces. Margarine. Salad oils. Plain salad dressings. Olives. Avocados.  Beverages Caffeinated beverages (such as coffee, tea, soda, or energy drinks). Alcoholic beverages. Fruit juices with pulp. Prune juice. Soft drinks sweetened with high-fructose corn syrup or sugar alcohols. Other Coconut. Hot sauce. Chili powder. Mayonnaise. Gravy. Cream-based or milk-based soups.  The items listed above may not be a complete list of foods and beverages to avoid. Contact your dietitian for more information. WHAT SHOULD I DO IF I BECOME DEHYDRATED? Diarrhea can sometimes lead to dehydration. Signs of dehydration include dark urine and dry mouth and skin. If you think you are dehydrated, you should rehydrate with an oral rehydration solution. These solutions can be purchased at pharmacies, retail stores, or online.  Drink -1 cup (120-240 mL) of oral rehydration solution each time you have an episode of diarrhea. If drinking this amount makes your diarrhea worse, try drinking smaller amounts more often. For example, drink 1-3 tsp (5-15 mL) every 5-10 minutes.  A general rule for staying hydrated is to drink 1-2 L of fluid per day. Talk to your health care provider about the specific amount you should be drinking each day. Drink enough fluids to keep your urine clear or pale yellow. Document Released: 07/15/2003 Document Revised: 04/29/2013 Document Reviewed: 03/17/2013 [facility] Patient Information 2015 [place], [place]. This information is not intended to replace advice given to you by your health care provider. Make sure you discuss any questions you have with your health care provider.  Diarrhea Diarrhea is frequent loose and watery bowel movements. It can cause you to feel weak and dehydrated. Dehydration can cause you to  become tired and thirsty, have a dry mouth, and have decreased urination that often is dark yellow. Diarrhea is a sign of another problem, most often an infection that will not last long. In most cases, diarrhea typically lasts 2-3 days. However, it can last longer if it is a sign of something more serious. It is important to treat your diarrhea as directed by your caregiver to lessen or prevent future episodes of diarrhea. CAUSES  Some common causes include:  Gastrointestinal infections caused by viruses, bacteria, or parasites.  Food poisoning or food allergies.  Certain medicines, such as antibiotics, chemotherapy, and laxatives.  Artificial sweeteners and fructose.  Digestive disorders. HOME CARE INSTRUCTIONS  Ensure adequate fluid intake (hydration): Have 1 cup (8 oz) of fluid for each diarrhea episode. Avoid fluids that contain simple sugars or sports drinks, fruit juices, whole milk products, and sodas. Your urine should be clear or pale yellow if you are drinking enough fluids. Hydrate with an oral rehydration solution that you can purchase at pharmacies, retail stores, and online. You can prepare an oral rehydration solution at home by mixing the following ingredients together:   - tsp table salt.   tsp baking soda.   tsp salt substitute containing potassium chloride.  1  tablespoons sugar.  1 L (34 oz) of water.  Certain foods and beverages may increase the speed at which food moves through the gastrointestinal (GI) tract. These foods and beverages should be avoided and include:  Caffeinated and alcoholic beverages.  High-fiber foods, such as raw fruits and vegetables, nuts, seeds, and whole grain breads and cereals.  Foods and beverages sweetened with sugar alcohols, such as xylitol, sorbitol,  and mannitol.  Some foods may be well tolerated and may help thicken stool including:  Starchy foods, such as rice, toast, pasta, low-sugar cereal, oatmeal, grits, baked  potatoes, crackers, and bagels.  Bananas.  Applesauce.  Add probiotic-rich foods to help increase healthy bacteria in the GI tract, such as yogurt and fermented milk products.  Wash your hands well after each diarrhea episode.  Only take over-the-counter or prescription medicines as directed by your caregiver.  Take a warm bath to relieve any burning or pain from frequent diarrhea episodes. SEEK IMMEDIATE MEDICAL CARE IF:   You are unable to keep fluids down.  You have persistent vomiting.  You have blood in your stool, or your stools are black and tarry.  You do not urinate in 6-8 hours, or there is only a small amount of very dark urine.  You have abdominal pain that increases or localizes.  You have weakness, dizziness, confusion, or light-headedness.  You have a severe headache.  Your diarrhea gets worse or does not get better.  You have a fever or persistent symptoms for more than 2-3 days.  You have a fever and your symptoms suddenly get worse. MAKE SURE YOU:   Understand these instructions.  Will watch your condition.  Will get help right away if you are not doing well or get worse. Document Released: 04/14/2002 Document Revised: 09/08/2013 Document Reviewed: 12/31/2011 [facility] Patient Information 2015 [place], [place]. This information is not intended to replace advice given to you by your health care provider. Make sure you discuss any questions you have with your health care provider.  Pneumonia Pneumonia is an infection of the lungs.  CAUSES Pneumonia may be caused by bacteria or a virus. Usually, these infections are caused by breathing infectious particles into the lungs (respiratory tract). SIGNS AND SYMPTOMS   Cough.  Fever.  Chest pain.  Increased rate of breathing.  Wheezing.  Mucus production. DIAGNOSIS  If you have the common symptoms of pneumonia, your health care provider will typically confirm the diagnosis with a chest X-ray. The  X-ray will show an abnormality in the lung (pulmonary infiltrate) if you have pneumonia. Other tests of your blood, urine, or sputum may be done to find the specific cause of your pneumonia. Your health care provider may also do tests (blood gases or pulse oximetry) to see how well your lungs are working. TREATMENT  Some forms of pneumonia may be spread to other people when you cough or sneeze. You may be asked to wear a mask before and during your exam. Pneumonia that is caused by bacteria is treated with antibiotic medicine. Pneumonia that is caused by the influenza virus may be treated with an antiviral medicine. Most other viral infections must run their course. These infections will not respond to antibiotics.  HOME CARE INSTRUCTIONS   Cough suppressants may be used if you are losing too much rest. However, coughing protects you by clearing your lungs. You should avoid using cough suppressants if you can.  Your health care provider may have prescribed medicine if he or she thinks your pneumonia is caused by bacteria or influenza. Finish your medicine even if you start to feel better.  Your health care provider may also prescribe an expectorant. This loosens the mucus to be coughed up.  Take medicines only as directed by your health care provider.  Do not smoke. Smoking is a common cause of bronchitis and can contribute to pneumonia. If you are a smoker and continue to smoke,  your cough may last several weeks after your pneumonia has cleared.  A cold steam vaporizer or humidifier in your room or home may help loosen mucus.  Coughing is often worse at night. Sleeping in a semi-upright position in a recliner or using a couple pillows under your head will help with this.  Get rest as you feel it is needed. Your body will usually let you know when you need to rest. PREVENTION A pneumococcal shot (vaccine) is available to prevent a common bacterial cause of pneumonia. This is usually suggested  for:  People over 19 years old.  Patients on chemotherapy.  People with chronic lung problems, such as bronchitis or emphysema.  People with immune system problems. If you are over 65 or have a high risk condition, you may receive the pneumococcal vaccine if you have not received it before. In some countries, a routine influenza vaccine is also recommended. This vaccine can help prevent some cases of pneumonia.You may be offered the influenza vaccine as part of your care. If you smoke, it is time to quit. You may receive instructions on how to stop smoking. Your health care provider can provide medicines and counseling to help you quit. SEEK MEDICAL CARE IF: You have a fever. SEEK IMMEDIATE MEDICAL CARE IF:   Your illness becomes worse. This is especially true if you are elderly or weakened from any other disease.  You cannot control your cough with suppressants and are losing sleep.  You begin coughing up blood.  You develop pain which is getting worse or is uncontrolled with medicines.  Any of the symptoms which initially brought you in for treatment are getting worse rather than better.  You develop shortness of breath or chest pain. MAKE SURE YOU:   Understand these instructions.  Will watch your condition.  Will get help right away if you are not doing well or get worse. Document Released: 04/24/2005 Document Revised: 09/08/2013 Document Reviewed: 07/14/2010 [facility] Patient Information 2015 [place], [place]. This information is not intended to replace advice given to you by your health care provider. Make sure you discuss any questions you have with your health care provider.

## 2014-11-05 NOTE — ED Notes (Signed)
Diarrhea x 3 days. [facility] and was told she might need IV fluids for possible dehydration. Weakness and pale.

## 2014-11-05 NOTE — ED Notes (Signed)
Pt directed to pharmacy to pick up meds- d/c home with ride

## 2014-11-07 LAB — URINE CULTURE: Culture: 3000

## 2014-11-16 ENCOUNTER — Ambulatory Visit (INDEPENDENT_AMBULATORY_CARE_PROVIDER_SITE_OTHER): Payer: Medicare Other | Admitting: Family Medicine

## 2014-11-16 ENCOUNTER — Encounter: Payer: Self-pay | Admitting: Family Medicine

## 2014-11-16 VITALS — BP 126/72 | HR 84 | Temp 98.0°F | Wt 147.0 lb

## 2014-11-16 DIAGNOSIS — R197 Diarrhea, unspecified: Secondary | ICD-10-CM

## 2014-11-16 MED ORDER — METRONIDAZOLE 500 MG PO TABS: 500.0000 mg | ORAL_TABLET | Freq: Three times a day (TID) | ORAL | Status: DC

## 2014-11-16 NOTE — Progress Notes (Signed)
Subjective:    Patient ID: Casey Harrell, female    DOB: Mar 05, 1935, 80 y.o.   MRN: [id number]  HPI Patient seen with persistent diarrhea following recent respiratory illness. She had several visits including here, emergency room, and [facility]. She was seen initially and placed on Augmentin for upper respiratory infection with productive cough. She subsequently went to the ER and was change from Augmentin to Zithromax after developing some diarrhea. Chest x-ray there did not reveal any obvious infiltrate. There was mention of some "air space disease right hilar region with associated trace pleural fluid in the fissure". She has no cough at this time and no fever. She is though having 4-5 watery nonbloody stools per day. No fevers or chills. Recent ER labs unremarkable. There was order for Clostridium difficile but this apparently was never collected  She is keeping fluids down and no fevers or chills. No abdominal pain. No recent travels  Past Medical History  Diagnosis Date  . DIABETES-TYPE 2 07/15/2008  . DIABETIC PERIPHERAL NEUROPATHY 09/02/2009  . HYPERTENSION 07/15/2008  . ASTHMA 07/15/2008  . GERD 07/15/2008  . PVD (peripheral vascular disease) 06/18/09    RCE  . Normal cardiac stress test     low risk Nuc 2009, Feb 2011  . COPD (chronic obstructive pulmonary disease)    Past Surgical History  Procedure Laterality Date  . Appendectomy  1971  . Abdominal hysterectomy  1971  . Tonsillectomy    . Tubal ligation    . Cesarean section    . Flexible sigmoidoscopy      diverticulitis  . Laparotomy      X 3 with adhesions lysis  . Foot surgery      right foot X 2  . Carotid endarterectomy  06/18/09    RCE    reports that she has never smoked. She has never used smokeless tobacco. She reports that she does not drink alcohol. Her drug history is not on file. family history includes Arthritis in her mother; Asthma in her sister; Cancer in her paternal grandfather; Cancer (age of  onset: 2260) in her brother; Celiac disease in her sister; Diabetes in her mother; Heart disease in her brother, father, mother, sister, and sister; Stroke in her maternal grandfather. Allergies  Allergen Reactions  . Doxycycline Hyclate Nausea And Vomiting    REACTION: vomiting  . Iohexol      Code: HIVES, Desc: pt. states she breaks out in hives 06/15/08   . Morphine Sulfate     REACTION: hallucinations  . Moxifloxacin     REACTION: unknown      Review of Systems  Constitutional: Negative for fever and chills.  Respiratory: Negative for cough and shortness of breath.   Cardiovascular: Negative for chest pain.  Gastrointestinal: Positive for diarrhea. Negative for nausea, vomiting and abdominal pain.  Neurological: Negative for dizziness.  Psychiatric/Behavioral: Negative for confusion.       Objective:   Physical Exam  Constitutional: She appears well-developed and well-nourished.  HENT:  Mouth/Throat: Oropharynx is clear and moist.  Cardiovascular: Normal rate and regular rhythm.   Pulmonary/Chest: Effort normal and breath sounds normal. No respiratory distress. She has no wheezes.  Abdominal: Soft. She exhibits no mass. There is no tenderness. There is no rebound and no guarding.  Musculoskeletal: She exhibits no edema.          Assessment & Plan:  Persistent diarrhea. Etiology unclear at this point. We do need to consider possibility of C. difficile with  recent antibiotics. Check C. difficile by PCR. Information on appropriate diet for diarrhea given. Stay well-hydrated. Empiric metronidazole 500 mg 3 times a day for 10 days pending C. difficile assessment.  She will be in touch if diarrhea not resolving by next week.

## 2014-11-16 NOTE — Progress Notes (Signed)
Pre visit review using our clinic review tool, if applicable. No additional management support is needed unless otherwise documented below in the visit note. 

## 2014-11-16 NOTE — Patient Instructions (Signed)

## 2014-12-02 LAB — CLOSTRIDIUM DIFFICILE BY PCR: Toxigenic C. Difficile by PCR: NOT DETECTED

## 2014-12-08 ENCOUNTER — Other Ambulatory Visit: Payer: Self-pay | Admitting: *Deleted

## 2014-12-08 MED ORDER — LEVOTHYROXINE SODIUM 50 MCG PO TABS: 50.0000 ug | ORAL_TABLET | Freq: Every day | ORAL | Status: DC

## 2014-12-10 ENCOUNTER — Encounter: Payer: Self-pay | Admitting: Gastroenterology

## 2014-12-12 ENCOUNTER — Other Ambulatory Visit: Payer: Self-pay | Admitting: Family Medicine

## 2014-12-25 ENCOUNTER — Ambulatory Visit (INDEPENDENT_AMBULATORY_CARE_PROVIDER_SITE_OTHER): Payer: Medicare Other | Admitting: Family Medicine

## 2014-12-25 VITALS — BP 130/70 | HR 71 | Temp 98.3°F | Wt 145.0 lb

## 2014-12-25 DIAGNOSIS — I10 Essential (primary) hypertension: Secondary | ICD-10-CM | POA: Diagnosis not present

## 2014-12-25 DIAGNOSIS — E039 Hypothyroidism, unspecified: Secondary | ICD-10-CM

## 2014-12-25 DIAGNOSIS — IMO0002 Reserved for concepts with insufficient information to code with codable children: Secondary | ICD-10-CM

## 2014-12-25 DIAGNOSIS — E1142 Type 2 diabetes mellitus with diabetic polyneuropathy: Secondary | ICD-10-CM | POA: Diagnosis not present

## 2014-12-25 DIAGNOSIS — E1165 Type 2 diabetes mellitus with hyperglycemia: Secondary | ICD-10-CM

## 2014-12-25 LAB — HEMOGLOBIN A1C: Hgb A1c MFr Bld: 8.7 % — ABNORMAL HIGH (ref 4.6–6.5)

## 2014-12-25 LAB — TSH: TSH: 2.88 u[IU]/mL (ref 0.35–4.50)

## 2014-12-25 MED ORDER — MECLIZINE HCL 12.5 MG PO TABS: 12.5000 mg | ORAL_TABLET | Freq: Four times a day (QID) | ORAL | Status: DC | PRN

## 2014-12-25 MED ORDER — PREGABALIN 150 MG PO CAPS: ORAL_CAPSULE | ORAL | Status: DC

## 2014-12-25 NOTE — Progress Notes (Signed)
Pre visit review using our clinic review tool, if applicable. No additional management support is needed unless otherwise documented below in the visit note. 

## 2014-12-25 NOTE — Progress Notes (Signed)
Subjective:    Patient ID: Casey Harrell, female    DOB: 12/02/1934, 80 y.o.   MRN: [id number]  HPI  Medical follow-up. Multiple chronic problems as outlined below. Type 2 diabetes. History of poor control. History of poor compliance. Very little exercise she does not bring any sugars to review today. Last A1c 9.1%. Currently on Levemir 40 units twice daily and takes NovoLog 16 units with breakfast, 5 units with lunch, and 16 units with supper. No recent hypoglycemia.  Increased fatigue recently. She has hypothyroidism and compliant with levothyroxine dosing. Last TSH almost a year ago. No chest pains. No dyspnea. Appetite and weight are stable  Continues to battle with peripheral neuropathy. She takes Lyrica and amitriptyline. Requesting refill of Lyrica.  Past Medical History  Diagnosis Date  . DIABETES-TYPE 2 07/15/2008  . DIABETIC PERIPHERAL NEUROPATHY 09/02/2009  . HYPERTENSION 07/15/2008  . ASTHMA 07/15/2008  . GERD 07/15/2008  . PVD (peripheral vascular disease) 06/18/09    RCE  . Normal cardiac stress test     low risk Nuc 2009, Feb 2011  . COPD (chronic obstructive pulmonary disease)    Past Surgical History  Procedure Laterality Date  . Appendectomy  1971  . Abdominal hysterectomy  1971  . Tonsillectomy    . Tubal ligation    . Cesarean section    . Flexible sigmoidoscopy      diverticulitis  . Laparotomy      X 3 with adhesions lysis  . Foot surgery      right foot X 2  . Carotid endarterectomy  06/18/09    RCE    reports that she has never smoked. She has never used smokeless tobacco. She reports that she does not drink alcohol. Her drug history is not on file. family history includes Arthritis in her mother; Asthma in her sister; Cancer in her paternal grandfather; Cancer (age of onset: 57) in her brother; Celiac disease in her sister; Diabetes in her mother; Heart disease in her brother, father, mother, sister, and sister; Stroke in her maternal  grandfather. Allergies  Allergen Reactions  . Doxycycline Hyclate Nausea And Vomiting    REACTION: vomiting  . Iohexol      Code: HIVES, Desc: pt. states she breaks out in hives 06/15/08   . Morphine Sulfate     REACTION: hallucinations  . Moxifloxacin     REACTION: unknown     Review of Systems  Constitutional: Positive for fatigue. Negative for appetite change and unexpected weight change.  Eyes: Negative for visual disturbance.  Respiratory: Negative for cough, chest tightness, shortness of breath and wheezing.   Cardiovascular: Negative for chest pain, palpitations and leg swelling.  Gastrointestinal: Negative for nausea, vomiting and diarrhea.  Endocrine: Negative for polydipsia and polyuria.  Neurological: Negative for dizziness, seizures, syncope, weakness, light-headedness and headaches.       Objective:   Physical Exam  Constitutional: She appears well-developed and well-nourished.  Neck: Neck supple. No thyromegaly present.  Cardiovascular: Normal rate and regular rhythm.   Pulmonary/Chest: Effort normal and breath sounds normal. No respiratory distress. She has no wheezes. She has no rales.  Musculoskeletal: She exhibits no edema.          Assessment & Plan:  #1 type 2 diabetes. Poor control. History of poor compliance. Recheck A1c. Adjust insulin as necessary #2 diabetic peripheral neuropathy. We discussed possible addition of daytime Lyrica but this point she wishes to hold. Refill Lyrica for 6 months #3 hypothyroidism. Recheck TSH

## 2014-12-28 ENCOUNTER — Other Ambulatory Visit: Payer: Self-pay

## 2015-01-14 ENCOUNTER — Other Ambulatory Visit: Payer: Self-pay | Admitting: *Deleted

## 2015-01-15 MED ORDER — AMITRIPTYLINE HCL 25 MG PO TABS: 25.0000 mg | ORAL_TABLET | Freq: Every day | ORAL | Status: DC

## 2015-01-15 MED ORDER — SIMVASTATIN 20 MG PO TABS: ORAL_TABLET | ORAL | Status: DC

## 2015-01-15 MED ORDER — DULOXETINE HCL 60 MG PO CPEP: ORAL_CAPSULE | ORAL | Status: DC

## 2015-01-25 ENCOUNTER — Other Ambulatory Visit: Payer: Self-pay | Admitting: Family Medicine

## 2015-01-26 ENCOUNTER — Telehealth: Payer: Self-pay | Admitting: Family Medicine

## 2015-01-26 NOTE — Telephone Encounter (Signed)
DR Burchette please advise.

## 2015-01-26 NOTE — Telephone Encounter (Signed)
Non formulary exception request has been denied for NOVOLOG FLEXPEN 100 UNIT/ML FlexPen humalog is an alternative

## 2015-01-27 ENCOUNTER — Telehealth: Payer: Self-pay | Admitting: *Deleted

## 2015-01-27 MED ORDER — INSULIN LISPRO 100 UNIT/ML (KWIKPEN): PEN_INJECTOR | SUBCUTANEOUS | Status: DC

## 2015-01-27 NOTE — Telephone Encounter (Signed)
OK to switch to Humalog.  This would be a unit per unit switch.

## 2015-01-27 NOTE — Telephone Encounter (Signed)
Left voicemail for patient to call back to make aware of update

## 2015-01-27 NOTE — Telephone Encounter (Signed)
Patient made aware.

## 2015-01-27 NOTE — Telephone Encounter (Signed)
Rx for Humalog 100 U/mL pen sent in

## 2015-01-27 NOTE — Telephone Encounter (Signed)
Rx sent in for Humalog pen

## 2015-02-20 ENCOUNTER — Other Ambulatory Visit: Payer: Self-pay | Admitting: Family Medicine

## 2015-02-22 ENCOUNTER — Other Ambulatory Visit: Payer: Self-pay

## 2015-03-04 ENCOUNTER — Ambulatory Visit: Payer: Medicare Other

## 2015-03-25 ENCOUNTER — Encounter: Payer: Self-pay | Admitting: Family Medicine

## 2015-03-25 ENCOUNTER — Ambulatory Visit (INDEPENDENT_AMBULATORY_CARE_PROVIDER_SITE_OTHER): Payer: Medicare Other | Admitting: Family Medicine

## 2015-03-25 VITALS — BP 130/80 | HR 86 | Temp 98.5°F | Resp 16 | Ht 61.0 in | Wt 149.5 lb

## 2015-03-25 DIAGNOSIS — Z794 Long term (current) use of insulin: Secondary | ICD-10-CM | POA: Diagnosis not present

## 2015-03-25 DIAGNOSIS — E1142 Type 2 diabetes mellitus with diabetic polyneuropathy: Secondary | ICD-10-CM

## 2015-03-25 DIAGNOSIS — IMO0002 Reserved for concepts with insufficient information to code with codable children: Secondary | ICD-10-CM

## 2015-03-25 DIAGNOSIS — K921 Melena: Secondary | ICD-10-CM | POA: Diagnosis not present

## 2015-03-25 DIAGNOSIS — I10 Essential (primary) hypertension: Secondary | ICD-10-CM

## 2015-03-25 DIAGNOSIS — E1122 Type 2 diabetes mellitus with diabetic chronic kidney disease: Secondary | ICD-10-CM

## 2015-03-25 DIAGNOSIS — E039 Hypothyroidism, unspecified: Secondary | ICD-10-CM

## 2015-03-25 DIAGNOSIS — N183 Chronic kidney disease, stage 3 unspecified: Secondary | ICD-10-CM

## 2015-03-25 DIAGNOSIS — E1165 Type 2 diabetes mellitus with hyperglycemia: Secondary | ICD-10-CM

## 2015-03-25 DIAGNOSIS — E785 Hyperlipidemia, unspecified: Secondary | ICD-10-CM | POA: Diagnosis not present

## 2015-03-25 DIAGNOSIS — Z23 Encounter for immunization: Secondary | ICD-10-CM

## 2015-03-25 DIAGNOSIS — K648 Other hemorrhoids: Secondary | ICD-10-CM

## 2015-03-25 LAB — CBC WITH DIFFERENTIAL/PLATELET
Basophils Absolute: 0.1 10*3/uL (ref 0.0–0.1)
Basophils Relative: 0.6 % (ref 0.0–3.0)
Eosinophils Absolute: 0.6 10*3/uL (ref 0.0–0.7)
Eosinophils Relative: 5.8 % — ABNORMAL HIGH (ref 0.0–5.0)
HCT: 39.4 % (ref 36.0–46.0)
Hemoglobin: 12.4 g/dL (ref 12.0–15.0)
Lymphocytes Relative: 39.4 % (ref 12.0–46.0)
Lymphs Abs: 4.3 10*3/uL — ABNORMAL HIGH (ref 0.7–4.0)
MCHC: 31.6 g/dL (ref 30.0–36.0)
MCV: 80.3 fl (ref 78.0–100.0)
Monocytes Absolute: 0.9 10*3/uL (ref 0.1–1.0)
Monocytes Relative: 8.6 % (ref 3.0–12.0)
Neutro Abs: 5 10*3/uL (ref 1.4–7.7)
Neutrophils Relative %: 45.6 % (ref 43.0–77.0)
Platelets: 288 10*3/uL (ref 150.0–400.0)
RBC: 4.9 Mil/uL (ref 3.87–5.11)
RDW: 18 % — ABNORMAL HIGH (ref 11.5–15.5)
WBC: 10.9 10*3/uL — ABNORMAL HIGH (ref 4.0–10.5)

## 2015-03-25 LAB — LDL CHOLESTEROL, DIRECT: Direct LDL: 47 mg/dL

## 2015-03-25 LAB — BASIC METABOLIC PANEL
BUN: 19 mg/dL (ref 6–23)
CO2: 30 mEq/L (ref 19–32)
Calcium: 9.7 mg/dL (ref 8.4–10.5)
Chloride: 102 mEq/L (ref 96–112)
Creatinine, Ser: 1.09 mg/dL (ref 0.40–1.20)
GFR: 51.61 mL/min — ABNORMAL LOW (ref >60.00)
Glucose, Bld: 120 mg/dL — ABNORMAL HIGH (ref 70–99)
Potassium: 4.9 mEq/L (ref 3.5–5.1)
Sodium: 138 mEq/L (ref 135–145)

## 2015-03-25 LAB — LIPID PANEL
Cholesterol: 93 mg/dL (ref 0–200)
HDL: 20.1 mg/dL — ABNORMAL LOW (ref >39.00)
NonHDL: 72.56
Total CHOL/HDL Ratio: 5
Triglycerides: 284 mg/dL — ABNORMAL HIGH (ref 0.0–149.0)
VLDL: 56.8 mg/dL — ABNORMAL HIGH (ref 0.0–40.0)

## 2015-03-25 LAB — HEPATIC FUNCTION PANEL
ALT: 22 U/L (ref 0–35)
AST: 29 U/L (ref 0–37)
Albumin: 4 g/dL (ref 3.5–5.2)
Alkaline Phosphatase: 87 U/L (ref 39–117)
Bilirubin, Direct: 0.1 mg/dL (ref 0.0–0.3)
Total Bilirubin: 0.4 mg/dL (ref 0.2–1.2)
Total Protein: 7.2 g/dL (ref 6.0–8.3)

## 2015-03-25 LAB — HEMOGLOBIN A1C: Hgb A1c MFr Bld: 9 % — ABNORMAL HIGH (ref 4.6–6.5)

## 2015-03-25 LAB — HM DIABETES FOOT EXAM

## 2015-03-25 LAB — TSH: TSH: 1.78 u[IU]/mL (ref 0.35–4.50)

## 2015-03-25 MED ORDER — HYDROCORTISONE ACETATE 25 MG RE SUPP: 25.0000 mg | Freq: Two times a day (BID) | RECTAL | Status: DC

## 2015-03-25 NOTE — Progress Notes (Signed)
Subjective:    Patient ID: Casey Harrell, female    DOB: 17-Apr-1935, 80 y.o.   MRN: [id number]  HPI Patient here for follow-up regarding multiple issues  Type 2 diabetes. History of poor control. Last A1c 8.7%. Husband states she is poorly compliant with diet. Insulin regimen reviewed. She also takes couple oral medications. No recent hypoglycemia.  Hypertension. This has been stable and at goal. No recent dizziness. She has hyperlipidemia treated with simvastatin. Due for repeat lipids  History of some intermittent diarrhea. She had episode over the summer diarrhea with C. difficile negative and this eventually resolved. She's had now about a week of some loose stools which occasionally bloody -mostly Bright blood but occasionally darker. History of hemorrhoids in the past.. No pain with stools. No recent antibiotics. No recent travels. She had colonoscopy September 2011 which showed some diverticulosis changes.  History of diabetic neuropathy. She is on combination treatment with Lyrica and Elavil and still has moderate pain at times.  Hypothyroidism treated with levothyroxine. She states she is compliant with therapy.  Past Medical History  Diagnosis Date  . DIABETES-TYPE 2 07/15/2008  . DIABETIC PERIPHERAL NEUROPATHY 09/02/2009  . HYPERTENSION 07/15/2008  . ASTHMA 07/15/2008  . GERD 07/15/2008  . PVD (peripheral vascular disease) (HCC) 06/18/09    RCE  . Normal cardiac stress test     low risk Nuc 2009, Feb 2011  . COPD (chronic obstructive pulmonary disease) [facility])    Past Surgical History  Procedure Laterality Date  . Appendectomy  1971  . Abdominal hysterectomy  1971  . Tonsillectomy    . Tubal ligation    . Cesarean section    . Flexible sigmoidoscopy      diverticulitis  . Laparotomy      X 3 with adhesions lysis  . Foot surgery      right foot X 2  . Carotid endarterectomy  06/18/09    RCE    reports that she has never smoked. She has never used smokeless tobacco. She  reports that she does not drink alcohol. Her drug history is not on file. family history includes Arthritis in her mother; Asthma in her sister; Cancer in her paternal grandfather; Cancer (age of onset: 77) in her brother; Celiac disease in her sister; Diabetes in her mother; Heart disease in her brother, father, mother, sister, and sister; Stroke in her maternal grandfather. Allergies  Allergen Reactions  . Doxycycline Hyclate Nausea And Vomiting    REACTION: vomiting  . Iohexol      Code: HIVES, Desc: pt. states she breaks out in hives 06/15/08   . Morphine Sulfate     REACTION: hallucinations  . Moxifloxacin     REACTION: unknown      Review of Systems  Constitutional: Negative for fatigue.  Eyes: Negative for visual disturbance.  Respiratory: Negative for cough, chest tightness, shortness of breath and wheezing.   Cardiovascular: Negative for chest pain, palpitations and leg swelling.  Gastrointestinal: Positive for diarrhea and blood in stool. Negative for nausea, vomiting, abdominal pain and abdominal distention.  Endocrine: Negative for polydipsia and polyuria.  Genitourinary: Negative for dysuria.  Neurological: Negative for dizziness, seizures, syncope, weakness, light-headedness and headaches.  Psychiatric/Behavioral: Negative for confusion.       Objective:   Physical Exam  Constitutional: She appears well-developed and well-nourished. No distress.  HENT:  Mouth/Throat: Oropharynx is clear and moist.  Neck: Neck supple. No JVD present. No thyromegaly present.  Cardiovascular: Normal rate and regular  rhythm.   Pulmonary/Chest: Effort normal and breath sounds normal. No respiratory distress. She has no wheezes. She has no rales.  Genitourinary:  No visible anal fissure. She has some external hemorrhoids which are not actively bleeding. She appears to be having some prolapsing internal hemorrhoids as well.  Musculoskeletal: She exhibits no edema.  Neurological: She is  alert.  Skin:  Patient has severe impairment with bilateral neuropathy in both feet. No lesions  Psychiatric: She has a normal mood and affect. Her behavior is normal.          Assessment & Plan:  #1 type 2 diabetes. History of poor control. Recheck insulin and adjust insulin accordingly. She did bring in some readings today and these were only fasting and consistently around 140-160. #2 hypothyroidism-repeat TSH #3 hypertension stable and at goal #4 history of chronic kidney disease. Recheck basic metabolic panel #5 dyslipidemia. Recheck lipid and hepatic panel. Continue simvastatin #6 recent hematochezia. Exam reveals external and probably some prolapsed internal hemorrhoids as well. No active bleeding. Hemoccult negative. Try hydrocortisone 25 suppository twice a day. If bleeding not resolved in 2 weeks consider GI referral for further management

## 2015-03-25 NOTE — Patient Instructions (Signed)
About Hemorrhoids  Hemorrhoids are swollen veins in the lower rectum and anus.  Also called piles, hemorrhoids are a common problem.  Hemorrhoids may be internal (inside the rectum) or external (around the anus).  Internal Hemorrhoids  Internal hemorrhoids are often painless, but they rarely cause bleeding.  The internal veins may stretch and fall down (prolapse) through the anus to the outside of the body.  The veins may then become irritated and painful.  External Hemorrhoids  External hemorrhoids can be easily seen or felt around the anal opening.  They are under the skin around the anus.  When the swollen veins are scratched or broken by straining, rubbing or wiping they sometimes bleed.  How Hemorrhoids Occur  Veins in the rectum and around the anus tend to swell under pressure.  Hemorrhoids can result from increased pressure in the veins of your anus or rectum.  Some sources of pressure are:   Straining to have a bowel movement because of constipation  Waiting too long to have a bowel movement  Coughing and sneezing often  Sitting for extended periods of time, including on the toilet  Diarrhea  Obesity  Trauma or injury to the anus  Some liver diseases  Stress  Family history of hemorrhoids  Pregnancy  Pregnant women should try to avoid becoming constipated, because they are more likely to have hemorrhoids during pregnancy.  In the last trimester of pregnancy, the enlarged uterus may press on blood vessels and causes hemorrhoids.  In addition, the strain of childbirth sometimes causes hemorrhoids after the birth.  Symptoms of Hemorrhoids  Some symptoms of hemorrhoids include:  Swelling and/or a tender lump around the anus  Itching, mild burning and bleeding around the anus  Painful bowel movements with or without constipation  Bright red blood covering the stool, on toilet paper or in the toilet bowel.   Symptoms usually go away within a few days.  Always  talk to your doctor about any bleeding to make sure it is not from some other causes.  Diagnosing and Treating Hemorrhoids  Diagnosis is made by an examination by your healthcare provider.  Special test can be performed by your doctor.    Most cases of hemorrhoids can be treated with:  High-fiber diet: Eat more high-fiber foods, which help prevent constipation.  Ask for more detailed fiber information on types and sources of fiber from your healthcare provider.  Fluids: Drink plenty of water.  This helps soften bowel movements so they are easier to pass.  Sitz baths and cold packs: Sitting in lukewarm water two or three times a day for 15 minutes cleases the anal area and may relieve discomfort.  If the water is too hot, swelling around the anus will get worse.  Placing a cloth-covered ice pack on the anus for ten minutes four times a day can also help reduce selling.  Gently pushing a prolapsed hemorrhoid back inside after the bath or ice pack can be helpful.  Medications: For mild discomfort, your healthcare provider may suggest over-the-counter pain medication or prescribe a cream or ointment for topical use.  The cream may contain witch hazel, zinc oxide or petroleum jelly.  Medicated suppositories are also a treatment option.  Always consult your doctor before applying medications or creams.  Procedures and surgeries: There are also a number of procedures and surgeries to shrink or remove hemorrhoids in more serious cases.  Talk to your physician about these options.  You can often prevent hemorrhoids or keep   them from becoming worse by maintaining a healthy lifestyle.  Eat a fiber-rich diet of fruits, vegetables and whole grains.  Also, drink plenty of water and exercise regularly.   2007, [employer] Doc.30 

## 2015-03-26 MED ORDER — INSULIN LISPRO 100 UNIT/ML (KWIKPEN): PEN_INJECTOR | SUBCUTANEOUS | Status: DC

## 2015-03-26 NOTE — Addendum Note (Signed)
Addended by: Tempie HoistMCNEIL, Rainie Crenshaw M on: 03/26/2015 11:00 AM   Modules accepted: Orders

## 2015-03-31 ENCOUNTER — Emergency Department (HOSPITAL_COMMUNITY): Payer: Medicare Other

## 2015-03-31 ENCOUNTER — Inpatient Hospital Stay (HOSPITAL_COMMUNITY)
Admission: EM | Admit: 2015-03-31 | Discharge: 2015-04-05 | DRG: 064 | Disposition: A | Discharge status: Rehabilitation Facility | Payer: Medicare Other | Attending: Internal Medicine | Admitting: Internal Medicine

## 2015-03-31 ENCOUNTER — Encounter (HOSPITAL_COMMUNITY): Payer: Self-pay | Admitting: Emergency Medicine

## 2015-03-31 DIAGNOSIS — E1142 Type 2 diabetes mellitus with diabetic polyneuropathy: Secondary | ICD-10-CM | POA: Diagnosis present

## 2015-03-31 DIAGNOSIS — E1165 Type 2 diabetes mellitus with hyperglycemia: Secondary | ICD-10-CM | POA: Diagnosis present

## 2015-03-31 DIAGNOSIS — E1100 Type 2 diabetes mellitus with hyperosmolarity without nonketotic hyperglycemic-hyperosmolar coma (NKHHC): Secondary | ICD-10-CM | POA: Diagnosis not present

## 2015-03-31 DIAGNOSIS — Z8249 Family history of ischemic heart disease and other diseases of the circulatory system: Secondary | ICD-10-CM | POA: Diagnosis not present

## 2015-03-31 DIAGNOSIS — E785 Hyperlipidemia, unspecified: Secondary | ICD-10-CM | POA: Diagnosis not present

## 2015-03-31 DIAGNOSIS — B961 Klebsiella pneumoniae [K. pneumoniae] as the cause of diseases classified elsewhere: Secondary | ICD-10-CM | POA: Diagnosis present

## 2015-03-31 DIAGNOSIS — Z91041 Radiographic dye allergy status: Secondary | ICD-10-CM

## 2015-03-31 DIAGNOSIS — I951 Orthostatic hypotension: Secondary | ICD-10-CM | POA: Diagnosis present

## 2015-03-31 DIAGNOSIS — G9340 Encephalopathy, unspecified: Secondary | ICD-10-CM

## 2015-03-31 DIAGNOSIS — E876 Hypokalemia: Secondary | ICD-10-CM | POA: Diagnosis not present

## 2015-03-31 DIAGNOSIS — J45909 Unspecified asthma, uncomplicated: Secondary | ICD-10-CM | POA: Diagnosis present

## 2015-03-31 DIAGNOSIS — G4733 Obstructive sleep apnea (adult) (pediatric): Secondary | ICD-10-CM | POA: Diagnosis present

## 2015-03-31 DIAGNOSIS — I5030 Unspecified diastolic (congestive) heart failure: Secondary | ICD-10-CM | POA: Diagnosis present

## 2015-03-31 DIAGNOSIS — R4182 Altered mental status, unspecified: Secondary | ICD-10-CM

## 2015-03-31 DIAGNOSIS — Z794 Long term (current) use of insulin: Secondary | ICD-10-CM | POA: Diagnosis not present

## 2015-03-31 DIAGNOSIS — Z90710 Acquired absence of both cervix and uterus: Secondary | ICD-10-CM | POA: Diagnosis not present

## 2015-03-31 DIAGNOSIS — I63512 Cerebral infarction due to unspecified occlusion or stenosis of left middle cerebral artery: Secondary | ICD-10-CM | POA: Diagnosis not present

## 2015-03-31 DIAGNOSIS — E860 Dehydration: Secondary | ICD-10-CM | POA: Diagnosis present

## 2015-03-31 DIAGNOSIS — K219 Gastro-esophageal reflux disease without esophagitis: Secondary | ICD-10-CM | POA: Diagnosis present

## 2015-03-31 DIAGNOSIS — F0390 Unspecified dementia without behavioral disturbance: Secondary | ICD-10-CM | POA: Diagnosis present

## 2015-03-31 DIAGNOSIS — I6789 Other cerebrovascular disease: Secondary | ICD-10-CM | POA: Diagnosis not present

## 2015-03-31 DIAGNOSIS — I6350 Cerebral infarction due to unspecified occlusion or stenosis of unspecified cerebral artery: Secondary | ICD-10-CM

## 2015-03-31 DIAGNOSIS — I639 Cerebral infarction, unspecified: Secondary | ICD-10-CM | POA: Diagnosis not present

## 2015-03-31 DIAGNOSIS — Z825 Family history of asthma and other chronic lower respiratory diseases: Secondary | ICD-10-CM

## 2015-03-31 DIAGNOSIS — H547 Unspecified visual loss: Secondary | ICD-10-CM | POA: Diagnosis present

## 2015-03-31 DIAGNOSIS — I69319 Unspecified symptoms and signs involving cognitive functions following cerebral infarction: Secondary | ICD-10-CM | POA: Diagnosis not present

## 2015-03-31 DIAGNOSIS — R410 Disorientation, unspecified: Secondary | ICD-10-CM

## 2015-03-31 DIAGNOSIS — R4701 Aphasia: Secondary | ICD-10-CM | POA: Diagnosis present

## 2015-03-31 DIAGNOSIS — I160 Hypertensive urgency: Secondary | ICD-10-CM | POA: Diagnosis present

## 2015-03-31 DIAGNOSIS — R509 Fever, unspecified: Secondary | ICD-10-CM

## 2015-03-31 DIAGNOSIS — Z888 Allergy status to other drugs, medicaments and biological substances status: Secondary | ICD-10-CM

## 2015-03-31 DIAGNOSIS — I6340 Cerebral infarction due to embolism of unspecified cerebral artery: Principal | ICD-10-CM | POA: Diagnosis present

## 2015-03-31 DIAGNOSIS — R197 Diarrhea, unspecified: Secondary | ICD-10-CM | POA: Diagnosis not present

## 2015-03-31 DIAGNOSIS — Z823 Family history of stroke: Secondary | ICD-10-CM | POA: Diagnosis not present

## 2015-03-31 DIAGNOSIS — Z881 Allergy status to other antibiotic agents status: Secondary | ICD-10-CM

## 2015-03-31 DIAGNOSIS — G4730 Sleep apnea, unspecified: Secondary | ICD-10-CM | POA: Diagnosis present

## 2015-03-31 DIAGNOSIS — G8191 Hemiplegia, unspecified affecting right dominant side: Secondary | ICD-10-CM | POA: Diagnosis present

## 2015-03-31 DIAGNOSIS — N390 Urinary tract infection, site not specified: Secondary | ICD-10-CM | POA: Diagnosis present

## 2015-03-31 DIAGNOSIS — E872 Acidosis: Secondary | ICD-10-CM | POA: Diagnosis present

## 2015-03-31 DIAGNOSIS — Z833 Family history of diabetes mellitus: Secondary | ICD-10-CM

## 2015-03-31 DIAGNOSIS — F329 Major depressive disorder, single episode, unspecified: Secondary | ICD-10-CM | POA: Diagnosis present

## 2015-03-31 DIAGNOSIS — G8190 Hemiplegia, unspecified affecting unspecified side: Secondary | ICD-10-CM | POA: Diagnosis not present

## 2015-03-31 DIAGNOSIS — Z7982 Long term (current) use of aspirin: Secondary | ICD-10-CM

## 2015-03-31 DIAGNOSIS — IMO0002 Reserved for concepts with insufficient information to code with codable children: Secondary | ICD-10-CM | POA: Diagnosis present

## 2015-03-31 DIAGNOSIS — R42 Dizziness and giddiness: Secondary | ICD-10-CM

## 2015-03-31 DIAGNOSIS — R443 Hallucinations, unspecified: Secondary | ICD-10-CM

## 2015-03-31 DIAGNOSIS — J449 Chronic obstructive pulmonary disease, unspecified: Secondary | ICD-10-CM | POA: Diagnosis present

## 2015-03-31 DIAGNOSIS — F32A Depression, unspecified: Secondary | ICD-10-CM | POA: Diagnosis present

## 2015-03-31 DIAGNOSIS — Z885 Allergy status to narcotic agent status: Secondary | ICD-10-CM | POA: Diagnosis not present

## 2015-03-31 DIAGNOSIS — J9601 Acute respiratory failure with hypoxia: Secondary | ICD-10-CM | POA: Diagnosis present

## 2015-03-31 DIAGNOSIS — I10 Essential (primary) hypertension: Secondary | ICD-10-CM | POA: Diagnosis present

## 2015-03-31 DIAGNOSIS — R1110 Vomiting, unspecified: Secondary | ICD-10-CM

## 2015-03-31 DIAGNOSIS — R29898 Other symptoms and signs involving the musculoskeletal system: Secondary | ICD-10-CM

## 2015-03-31 DIAGNOSIS — E1151 Type 2 diabetes mellitus with diabetic peripheral angiopathy without gangrene: Secondary | ICD-10-CM | POA: Diagnosis present

## 2015-03-31 DIAGNOSIS — Z79899 Other long term (current) drug therapy: Secondary | ICD-10-CM

## 2015-03-31 DIAGNOSIS — I63432 Cerebral infarction due to embolism of left posterior cerebral artery: Secondary | ICD-10-CM | POA: Diagnosis not present

## 2015-03-31 HISTORY — DX: Dizziness and giddiness: R42

## 2015-03-31 LAB — URINALYSIS, ROUTINE W REFLEX MICROSCOPIC
Bilirubin Urine: NEGATIVE
Glucose, UA: >1000 mg/dL — AB
Ketones, ur: 40 mg/dL — AB
Leukocytes, UA: NEGATIVE
Nitrite: NEGATIVE
Protein, ur: 30 mg/dL — AB
Specific Gravity, Urine: 1.018 (ref 1.005–1.030)
pH: 7 (ref 5.0–8.0)

## 2015-03-31 LAB — URINE MICROSCOPIC-ADD ON

## 2015-03-31 LAB — COMPREHENSIVE METABOLIC PANEL
ALT: 31 U/L (ref 14–54)
AST: 34 U/L (ref 15–41)
Albumin: 4.5 g/dL (ref 3.5–5.0)
Alkaline Phosphatase: 84 U/L (ref 38–126)
Anion gap: 12 (ref 5–15)
BUN: 22 mg/dL — ABNORMAL HIGH (ref 6–20)
CO2: 25 mmol/L (ref 22–32)
Calcium: 9.5 mg/dL (ref 8.9–10.3)
Chloride: 103 mmol/L (ref 101–111)
Creatinine, Ser: 1.02 mg/dL — ABNORMAL HIGH (ref 0.44–1.00)
GFR calc Af Amer: 60 mL/min — ABNORMAL LOW (ref >60)
GFR calc non Af Amer: 52 mL/min — ABNORMAL LOW (ref >60)
Glucose, Bld: 199 mg/dL — ABNORMAL HIGH (ref 65–99)
Potassium: 4.1 mmol/L (ref 3.5–5.1)
Sodium: 140 mmol/L (ref 135–145)
Total Bilirubin: 0.6 mg/dL (ref 0.3–1.2)
Total Protein: 8.3 g/dL — ABNORMAL HIGH (ref 6.5–8.1)

## 2015-03-31 LAB — LIPASE, BLOOD: Lipase: 19 U/L (ref 11–51)

## 2015-03-31 LAB — CBC
HCT: 43.2 % (ref 36.0–46.0)
Hemoglobin: 13.9 g/dL (ref 12.0–15.0)
MCH: 25.7 pg — ABNORMAL LOW (ref 26.0–34.0)
MCHC: 32.2 g/dL (ref 30.0–36.0)
MCV: 80 fL (ref 78.0–100.0)
Platelets: 300 10*3/uL (ref 150–400)
RBC: 5.4 MIL/uL — ABNORMAL HIGH (ref 3.87–5.11)
RDW: 16.6 % — ABNORMAL HIGH (ref 11.5–15.5)
WBC: 12 10*3/uL — ABNORMAL HIGH (ref 4.0–10.5)

## 2015-03-31 LAB — I-STAT CG4 LACTIC ACID, ED: Lactic Acid, Venous: 2.04 mmol/L (ref 0.5–2.0)

## 2015-03-31 MED ORDER — INSULIN DETEMIR 100 UNIT/ML FLEXPEN: 40.0000 [IU] | PEN_INJECTOR | Freq: Two times a day (BID) | SUBCUTANEOUS | Status: DC

## 2015-03-31 MED ORDER — ATORVASTATIN CALCIUM 10 MG PO TABS
10.0000 mg | ORAL_TABLET | Freq: Every day | ORAL | Status: DC
  Administered 2015-04-01 – 2015-04-05 (×5): 10 mg via ORAL
  Filled 2015-03-31 (×5): qty 1

## 2015-03-31 MED ORDER — CARVEDILOL 12.5 MG PO TABS
25.0000 mg | ORAL_TABLET | Freq: Two times a day (BID) | ORAL | Status: DC
  Administered 2015-04-01 – 2015-04-02 (×3): 25 mg via ORAL
  Filled 2015-03-31 (×3): qty 2

## 2015-03-31 MED ORDER — INSULIN ASPART 100 UNIT/ML ~~LOC~~ SOLN
0.0000 [IU] | Freq: Three times a day (TID) | SUBCUTANEOUS | Status: DC
  Administered 2015-04-01: 2 [IU] via SUBCUTANEOUS
  Administered 2015-04-01 (×2): 3 [IU] via SUBCUTANEOUS
  Administered 2015-04-02: 2 [IU] via SUBCUTANEOUS
  Administered 2015-04-02: 5 [IU] via SUBCUTANEOUS
  Administered 2015-04-02: 1 [IU] via SUBCUTANEOUS
  Administered 2015-04-03: 2 [IU] via SUBCUTANEOUS
  Administered 2015-04-03 – 2015-04-04 (×2): 1 [IU] via SUBCUTANEOUS
  Administered 2015-04-04: 2 [IU] via SUBCUTANEOUS
  Administered 2015-04-04: 3 [IU] via SUBCUTANEOUS
  Administered 2015-04-05: 1 [IU] via SUBCUTANEOUS
  Administered 2015-04-05: 3 [IU] via SUBCUTANEOUS
  Administered 2015-04-05: 5 [IU] via SUBCUTANEOUS

## 2015-03-31 MED ORDER — IRBESARTAN 150 MG PO TABS
75.0000 mg | ORAL_TABLET | Freq: Every day | ORAL | Status: DC
  Administered 2015-04-01: 75 mg via ORAL
  Filled 2015-03-31: qty 1

## 2015-03-31 MED ORDER — HALOPERIDOL LACTATE 5 MG/ML IJ SOLN
INTRAMUSCULAR | Status: AC
  Administered 2015-03-31: 2.5 mg via INTRAVENOUS
  Filled 2015-03-31: qty 1

## 2015-03-31 MED ORDER — ASPIRIN 325 MG PO TABS
325.0000 mg | ORAL_TABLET | Freq: Every day | ORAL | Status: DC
Start: 2015-03-31 — End: 2015-04-01
  Administered 2015-03-31: 325 mg via ORAL
  Filled 2015-03-31 (×2): qty 1

## 2015-03-31 MED ORDER — DULOXETINE HCL 60 MG PO CPEP
60.0000 mg | ORAL_CAPSULE | Freq: Every day | ORAL | Status: DC
  Administered 2015-04-01 – 2015-04-05 (×5): 60 mg via ORAL
  Filled 2015-03-31 (×5): qty 1

## 2015-03-31 MED ORDER — STROKE: EARLY STAGES OF RECOVERY BOOK
Freq: Once | Status: AC
  Administered 2015-03-31: 23:00:00

## 2015-03-31 MED ORDER — SODIUM CHLORIDE 0.9 % IV BOLUS (SEPSIS)
500.0000 mL | Freq: Once | INTRAVENOUS | Status: AC
  Administered 2015-03-31: 500 mL via INTRAVENOUS

## 2015-03-31 MED ORDER — ONDANSETRON HCL 4 MG/2ML IJ SOLN
4.0000 mg | Freq: Once | INTRAMUSCULAR | Status: AC | PRN
  Administered 2015-03-31: 4 mg via INTRAVENOUS
  Filled 2015-03-31: qty 2

## 2015-03-31 MED ORDER — HYDROCORTISONE ACETATE 25 MG RE SUPP
25.0000 mg | Freq: Two times a day (BID) | RECTAL | Status: DC
  Administered 2015-03-31 – 2015-04-05 (×8): 25 mg via RECTAL
  Filled 2015-03-31 (×11): qty 1

## 2015-03-31 MED ORDER — PREGABALIN 75 MG PO CAPS
150.0000 mg | ORAL_CAPSULE | Freq: Every day | ORAL | Status: DC
  Administered 2015-04-01 – 2015-04-05 (×5): 150 mg via ORAL
  Filled 2015-03-31 (×5): qty 2

## 2015-03-31 MED ORDER — PANTOPRAZOLE SODIUM 40 MG PO TBEC
40.0000 mg | DELAYED_RELEASE_TABLET | Freq: Every day | ORAL | Status: DC
  Administered 2015-04-01 – 2015-04-05 (×5): 40 mg via ORAL
  Filled 2015-03-31 (×5): qty 1

## 2015-03-31 MED ORDER — LEVOTHYROXINE SODIUM 50 MCG PO TABS
50.0000 ug | ORAL_TABLET | Freq: Every day | ORAL | Status: DC
  Administered 2015-04-01 – 2015-04-05 (×5): 50 ug via ORAL
  Filled 2015-03-31 (×5): qty 1

## 2015-03-31 MED ORDER — HALOPERIDOL 5 MG PO TABS: 2.5000 mg | ORAL_TABLET | Freq: Once | ORAL | Status: DC

## 2015-03-31 MED ORDER — AMITRIPTYLINE HCL 25 MG PO TABS: 25.0000 mg | ORAL_TABLET | Freq: Every day | ORAL | Status: DC

## 2015-03-31 MED ORDER — ENOXAPARIN SODIUM 40 MG/0.4ML ~~LOC~~ SOLN
40.0000 mg | SUBCUTANEOUS | Status: DC
  Administered 2015-03-31 – 2015-04-04 (×5): 40 mg via SUBCUTANEOUS
  Filled 2015-03-31 (×5): qty 0.4

## 2015-03-31 MED ORDER — INSULIN DETEMIR 100 UNIT/ML ~~LOC~~ SOLN
40.0000 [IU] | Freq: Two times a day (BID) | SUBCUTANEOUS | Status: DC
  Filled 2015-03-31 (×2): qty 0.4

## 2015-03-31 MED ORDER — DILTIAZEM HCL ER COATED BEADS 120 MG PO CP24
120.0000 mg | ORAL_CAPSULE | Freq: Every day | ORAL | Status: DC
  Administered 2015-04-01: 120 mg via ORAL
  Filled 2015-03-31: qty 1

## 2015-03-31 MED ORDER — METOCLOPRAMIDE HCL 5 MG/ML IJ SOLN
10.0000 mg | Freq: Once | INTRAMUSCULAR | Status: AC
  Administered 2015-03-31: 10 mg via INTRAVENOUS
  Filled 2015-03-31: qty 2

## 2015-03-31 MED ORDER — HYDRALAZINE HCL 20 MG/ML IJ SOLN
5.0000 mg | Freq: Four times a day (QID) | INTRAMUSCULAR | Status: DC | PRN
  Administered 2015-03-31: 5 mg via INTRAVENOUS
  Filled 2015-03-31: qty 1

## 2015-03-31 MED ORDER — INSULIN ASPART 100 UNIT/ML ~~LOC~~ SOLN
0.0000 [IU] | Freq: Every day | SUBCUTANEOUS | Status: DC
  Administered 2015-04-03 – 2015-04-04 (×2): 2 [IU] via SUBCUTANEOUS

## 2015-03-31 MED ORDER — HALOPERIDOL LACTATE 5 MG/ML IJ SOLN
2.5000 mg | Freq: Once | INTRAMUSCULAR | Status: AC
  Administered 2015-03-31: 2.5 mg via INTRAVENOUS

## 2015-03-31 MED ORDER — SIMVASTATIN 20 MG PO TABS: 20.0000 mg | ORAL_TABLET | Freq: Every day | ORAL | Status: DC

## 2015-03-31 NOTE — ED Notes (Addendum)
Pt becoming increasingly agitated, trying to remove leads/BP cuff, and attempting to get out of bed.  Little MD made aware.

## 2015-03-31 NOTE — Progress Notes (Signed)
[facility] with diagnosis of stroke, drowsy but easy to arouse, denies any pain, pt settled in bed, admit doc paged, pt reassured, will however continue to monitor.Obasogie-Asidi, Tahjae Durr Efe

## 2015-03-31 NOTE — ED Notes (Signed)
Pt returned from CT scan (radiology) and threw up upon return to room; crying and moaning at this time

## 2015-03-31 NOTE — H&P (Signed)
[facility] History and Physical  Casey Harrell LKG:[id number] DOB: 06-29-1934 DOA: 03/31/2015  Referring physician: EDP dR Little.  PCP: [name], MD   Chief Complaint: confusion and agitation.   HPI: Casey Harrell is a 80 y.o. female with prior h/o DM, hypertension, asthma, PVD, COPD, was brought in by family for confusion, agitation. As per the husband the grandson noticed that she was getting confused after she woke up in the morning and the confusion started getting worse, associated with some agitation. By the time i saw the patient, she received haldol for agitation and is sound asleep. Her husband at bedside, said he was at work when this all happened. There was no fever at home, but he reports her having some dizziness and vertigo in the last few weeks and some non specific abdominal pain and diarrhea. She underwent a CT head on arrival which showed an acute infarct int he medial posterior right occipital lobe. She also underwent an abdominal CT for the vague abd pain which was negative for acute findings. Her lab work revealed wbc count of 12,000 and lactic acid level of 2. She was also found to be febrile on arrival with temp of 100.7. Dr [name] was consulted for neurology and requested transfer to [facility] for further evaluation. Pt will be admitted to medical service for evaluation of stroke.    Review of Systems:  Could not be obtained as she is confused and sleeping right now.   Past Medical History  Diagnosis Date  . DIABETES-TYPE 2 07/15/2008  . DIABETIC PERIPHERAL NEUROPATHY 09/02/2009  . HYPERTENSION 07/15/2008  . ASTHMA 07/15/2008  . GERD 07/15/2008  . PVD (peripheral vascular disease) (HCC) 06/18/09    RCE  . Normal cardiac stress test     low risk Nuc 2009, Feb 2011  . COPD (chronic obstructive pulmonary disease) [facility])    Past Surgical History  Procedure Laterality Date  . Appendectomy  1971  . Abdominal hysterectomy  1971  . Tonsillectomy    . Tubal ligation     . Cesarean section    . Flexible sigmoidoscopy      diverticulitis  . Laparotomy      X 3 with adhesions lysis  . Foot surgery      right foot X 2  . Carotid endarterectomy  06/18/09    RCE   Social History:  reports that she has never smoked. She has never used smokeless tobacco. She reports that she does not drink alcohol. Her drug history is not on file.  Allergies  Allergen Reactions  . Doxycycline Hyclate Nausea And Vomiting    REACTION: vomiting  . Iohexol      Code: HIVES, Desc: pt. states she breaks out in hives 06/15/08   . Morphine Sulfate     REACTION: hallucinations  . Moxifloxacin     REACTION: unknown    Family History  Problem Relation Age of Onset  . Diabetes Mother   . Arthritis Mother   . Heart disease Mother   . Heart disease Father   . Celiac disease Sister   . Stroke Maternal Grandfather   . Cancer Paternal Grandfather     lung  . Heart disease Sister   . Heart disease Sister   . Asthma Sister   . Heart disease Brother   . Cancer Brother 60    lung cancer    Prior to Admission medications   Medication Sig Start Date End Date Taking? Authorizing Provider  amitriptyline (  ELAVIL) 25 MG tablet Take 1 tablet (25 mg total) by mouth at bedtime. 01/15/15  Yes [name], MD  canagliflozin (INVOKANA) 300 MG TABS tablet Take 300 mg by mouth daily before breakfast. 09/18/14  Yes [name], MD  DULoxetine (CYMBALTA) 60 MG capsule TAKE 1 CAPSULE BY MOUTH DAILY 01/15/15  Yes [name], MD  hydrocortisone (ANUSOL-HC) 25 MG suppository Place 1 suppository (25 mg total) rectally 2 (two) times daily. 03/25/15  Yes [name], MD  Insulin Detemir (LEVEMIR FLEXTOUCH) 100 UNIT/ML Pen Inject 40 Units into the skin 2 (two) times daily. 09/10/14  Yes [name], MD  insulin lispro (HUMALOG) 100 UNIT/ML KiwkPen INJECT 20 UNITS AT BREAKFAST, 10 UNITS AT LUNCH, AND 20 UNITS WITH SUPPER. 03/26/15  Yes [name], MD  meclizine  (ANTIVERT) 12.5 MG tablet Take 1 tablet (12.5 mg total) by mouth every 6 (six) hours as needed for dizziness. 12/25/14  Yes [name], MD  metFORMIN (GLUCOPHAGE) 1000 MG tablet Take 1 tablet (1,000 mg total) by mouth 2 (two) times daily with a meal. 12/16/13  Yes [name], MD  simvastatin (ZOCOR) 20 MG tablet TAKE 1 TABLET BY MOUTH AT BEDTIME 01/15/15  Yes [name], MD  valsartan (DIOVAN) 160 MG tablet TAKE 1 TABLET (160 MG TOTAL) BY MOUTH DAILY. 12/14/14  Yes [name], MD  albuterol (ACCUNEB) 1.25 MG/3ML nebulizer solution Take 1 ampule by nebulization every 6 (six) hours as needed for wheezing.    Historical Provider, MD  aspirin 81 MG tablet Take 81 mg by mouth daily.      Historical Provider, MD  B-D ULTRAFINE III SHORT PEN 31G X 8 MM MISC USE AS DIRECTED 09/04/13   [name], MD  Calcium Citrate (CITRACAL PO) Take 1 tablet by mouth 2 (two) times daily.     Historical Provider, MD  carvedilol (COREG) 25 MG tablet Take 1 tablet (25 mg total) by mouth 2 (two) times daily with a meal. 12/09/13   Runell Gess, MD  Cholecalciferol (VITAMIN D3) 2000 UNITS TABS Take 2,000 Units by mouth daily.     Historical Provider, MD  CLEVER CHEK AUTO-CODE VOICE test strip  05/07/11   Historical Provider, MD  diltiazem (CARTIA XT) 120 MG 24 hr capsule Take 1 capsule (120 mg total) by mouth daily. NEEDS APPOINTMENT FOR FUTURE REFILLS 09/28/14   Runell Gess, MD  Lancets MISC  05/07/11   Historical Provider, MD  levothyroxine (SYNTHROID, LEVOTHROID) 50 MCG tablet Take 1 tablet (50 mcg total) by mouth daily before breakfast. 12/08/14   [name], MD  magnesium oxide (MAG-OX) 400 (241.3 MG) MG tablet TAKE 1 TABLET BY MOUTH DAILY    [name], MD  metFORMIN (GLUCOPHAGE) 1000 MG tablet TAKE 1 TABLET BY MOUTH TWICE A DAY WITH MEAL 02/22/15   [name], MD  NOVOLOG FLEXPEN 100 UNIT/ML FlexPen INJECT 9 TO 14 UNITS UNDER THE SKIN 2 TIMES DAILY WITH A MEAL 9 UNITS  BEFORE BREAKFAST AND 14 UNITS AFTER SUPPER 01/25/15   [name], MD  ondansetron (ZOFRAN ODT) 4 MG disintegrating tablet Take 1 tablet (4 mg total) by mouth every 8 (eight) hours as needed for nausea or vomiting. 04/09/14   Roxy Horseman, PA-C  pantoprazole (PROTONIX) 40 MG tablet TAKE 1 TABLET BY MOUTH TWICE DAILY 12/15/13   [name], MD  pregabalin (LYRICA) 150 MG capsule TAKE ONE CAPSULE BY MOUTH DAILY 12/25/14  Kristian CoveyBruce W Burchette, MD  promethazine (PHENERGAN) 12.5 MG tablet 1-2 tabs po q6h prn nausea 04/09/14   Jeoffrey MassedPhilip H McGowen, MD   Physical Exam: Filed Vitals:   03/31/15 1727 03/31/15 1822 03/31/15 1830 03/31/15 1841  BP: 191/92  221/95 207/97  Pulse: 97  109 111  Temp:  100.6 F (38.1 C)    TempSrc:  Rectal    Resp: 19  22 25   SpO2: 98%  87% 93%    Wt Readings from Last 3 Encounters:  03/25/15 67.813 kg (149 lb 8 oz)  12/25/14 65.772 kg (145 lb)  11/16/14 66.679 kg (147 lb)    General: sleeping.  Eyes: PERRL, normal lids, irises & conjunctiva Neck: no LAD, masses or thyromegaly Cardiovascular: RRR, no m/r/g. No LE edema. Telemetry: SR, no arrhythmias  Respiratory: CTA bilaterally, no w/r/r. Normal respiratory effort. Abdomen: soft, ntnd Skin: no rash or induration seen on limited exam Musculoskeletal: grossly normal tone BUE/BLE Neurologic: moaning on verbal and tactile stimuli, able to move all extremities , could not check her speech or other motor functions.           Labs on Admission:  Basic Metabolic Panel:  Recent Labs Lab 03/25/15 1231 03/31/15 1419  NA 138 140  K 4.9 4.1  CL 102 103  CO2 30 25  GLUCOSE 120* 199*  BUN 19 22*  CREATININE 1.09 1.02*  CALCIUM 9.7 9.5   Liver Function Tests:  Recent Labs Lab 03/25/15 1231 03/31/15 1419  AST 29 34  ALT 22 31  ALKPHOS 87 84  BILITOT 0.4 0.6  PROT 7.2 8.3*  ALBUMIN 4.0 4.5    Recent Labs Lab 03/31/15 1419  LIPASE 19   No results for input(s): AMMONIA in the last 168  hours. CBC:  Recent Labs Lab 03/25/15 1231 03/31/15 1419  WBC 10.9* 12.0*  NEUTROABS 5.0  --   HGB 12.4 13.9  HCT 39.4 43.2  MCV 80.3 80.0  PLT 288.0 300   Cardiac Enzymes: No results for input(s): CKTOTAL, CKMB, CKMBINDEX, TROPONINI in the last 168 hours.  BNP (last 3 results) No results for input(s): BNP in the last 8760 hours.  ProBNP (last 3 results) No results for input(s): PROBNP in the last 8760 hours.  CBG: No results for input(s): GLUCAP in the last 168 hours.  Radiological Exams on Admission: Ct Abdomen Pelvis Wo Contrast  03/31/2015  CLINICAL DATA:  Malaise with nausea and vomiting today. Disorientation. Initial encounter. EXAM: CT ABDOMEN AND PELVIS WITHOUT CONTRAST TECHNIQUE: Multidetector CT imaging of the abdomen and pelvis was performed following the standard protocol without IV contrast. COMPARISON:  CT 04/09/2014 and 08/23/2009. FINDINGS: Lower chest: Mild atelectasis and vascular crowding at both lung bases. There is no confluent airspace opacity or significant pleural effusion. Atherosclerosis and a small hiatal hernia noted. Hepatobiliary: Diffusely decreased hepatic density again noted consistent with steatosis. No focal abnormalities identified on noncontrast imaging. No evidence of gallstones, gallbladder wall thickening or biliary dilatation. Pancreas: Fatty replacement and atrophy. No acute findings or surrounding inflammation. Spleen: Normal in size without focal abnormality. Adrenals/Urinary Tract: Both adrenal glands appear normal. There is no evidence of urinary tract calculus or perinephric soft tissue stranding. There is mild collecting system dilatation bilaterally, probably related to distension of the urinary bladder. No focal bladder abnormality seen. Retroperitoneal calcification on the right (image 50) appears unchanged, within the gonadal vein. Stomach/Bowel: No evidence of bowel wall thickening, distention or surrounding inflammatory change. Mild  diverticular changes throughout the colon. Vascular/Lymphatic:  There are no enlarged abdominal or pelvic lymph nodes. Moderate aortoiliac atherosclerosis noted. Reproductive: Hysterectomy.  No evidence of adnexal mass. Other: Possible injection changes within the subcutaneous fat of the anterior abdominal wall. Musculoskeletal: No acute or significant osseous findings. There are diffuse degenerative changes throughout the spine. IMPRESSION: 1. No acute findings or explanation for the patient's symptoms. 2. Bladder distention, likely accounting for mild intrarenal collecting system dilatation. No evidence of urinary tract calculus or secondary signs of ureteral obstruction. 3. Hepatic steatosis, colonic diverticulosis and atherosclerosis noted. Electronically Signed   By: Carey Bullocks M.D.   On: 03/31/2015 17:43   Dg Chest 2 View  03/31/2015  CLINICAL DATA:  Shortness of breath, altered mental status EXAM: CHEST  2 VIEW COMPARISON:  11/05/2014 FINDINGS: Stable mild cardiomegaly. No focal pneumonia, collapse or consolidation. Negative for edema, effusion, or pneumothorax. Trachea midline. Monitor leads overlie the chest. No acute osseous finding. IMPRESSION: Cardiomegaly without acute process.  No interval change. Electronically Signed   By: Judie Petit.  Shick M.D.   On: 03/31/2015 15:53   Ct Head Wo Contrast  03/31/2015  CLINICAL DATA:  Disoriented with nausea and vomiting. EXAM: CT HEAD WITHOUT CONTRAST TECHNIQUE: Contiguous axial images were obtained from the base of the skull through the vertex without intravenous contrast. COMPARISON:  August 22, 2009 FINDINGS: There is mild diffuse atrophy. There is no intracranial mass, hemorrhage, extra-axial fluid collection, or midline shift. There is evidence of a prior infarct in the posterior inferior left occipital lobe. In comparison with the prior study, there is now decreased attenuation in the posterior medial right occipital lobe, concerning for recent infarct.  Elsewhere, there is patchy small vessel disease throughout the centra semiovale bilaterally. The bony calvarium appears intact. The mastoid air cells are clear. There is an air-fluid level in the right maxillary antrum. There is mucosal thickening in the left maxillary antrum. No intraorbital lesions are appreciable. IMPRESSION: Findings concerning for recent/ possibly acute infarct in the medial posterior right occipital lobe. Prior infarct posterior left occipital lobe. There is atrophy with patchy small vessel disease in the periventricular white matter bilaterally. No hemorrhage or mass effect. Sinusitis with air-fluid level in the right maxillary antrum. Electronically Signed   By: Bretta Bang III M.D.   On: 03/31/2015 16:44    EKG: Independently reviewed. Sinus rhythm   Assessment/Plan Active Problems:   Acute ischemic stroke [facility])   Stroke (HCC)   Acute stroke: Admit to telemetry and transfer to [facility] for further eval.  Stroke work up ordered.  She passed bed side swallow evaluation and is on carb modified diet.  MRI brain, MRA head and neck , echo and carotid  duplex ordered.  hgba1c ordered.  Start the patient on aspirin  . Neurology was consulted and will see the patient at [facility].    Hypertensive urgency: Permissive hypertension in view of the CVA.  PRN hydralazine for BP>200/100MMHG.    DM: insulin dependent,  She is on three different types of insulin, husband at bedside reports some non compliance to DM meds.  For now restarted her on levemir and SSI.   Fever, abnormal UA, : Urine cultures sent.   Hypoxia :  Unclear etiology, ? Probably from haldol use. She is on 1 lit of [place] oxygen, .  CXR does not show any new infiltrates.  She had one episode of vomiting after the CT of the abdomen and pelvis.  ? Aspiration, mild wbc count elevation on admission.  Would not start her  on antibiotics at this time.  Monitor.    COPD;  No wheezing heard,  Chronic dry cough.       Code Status: presumed full code.  DVT Prophylaxis: Family Communication: husband at bedside.  Disposition Plan: transfer to [facility], pending further work up, will need atleast a 2 day stay.   Time spent: 50 minutes.   [facility] Pager [phone]

## 2015-03-31 NOTE — ED Provider Notes (Signed)
CSN: [id number]     Arrival date & time 03/31/15  1318 History   First MD Initiated Contact with Patient 03/31/15 1455     Chief Complaint  Patient presents with  . Altered Mental Status  . Nausea  . Emesis  . Diarrhea     (Consider location/radiation/quality/duration/timing/severity/associated sxs/prior Treatment) HPI Comments: 80yo F w/ PMH including T2DM, HTN, PVD, COPD, GERD who p/w altered mental status. Hx limited due to patient's condition and obtained from husband. He reports that since 6am this morning when pt woke up, she has been confused, disoriented, talking about non-sensical things. She is complaining of generalized malaise and intermittent abdominal pain. She was seen for this abdominal pain last week and diagnosed with internal hemorrhoids. She has been on stool softeners and suppositories. She has had a two-year history of cough which has not changed recently and a long history of intermittent diarrhea which has also not changed recently. No fevers. She occasionally has nausea and vomiting but no significant vomiting. No skin rashes. She does have a history of dementia.  Patient is a 80 y.o. female presenting with altered mental status, vomiting, and diarrhea. The history is provided by the spouse. The history is limited by the condition of the patient.  Altered Mental Status Associated symptoms: vomiting   Emesis Associated symptoms: diarrhea   Diarrhea Associated symptoms: vomiting     Past Medical History  Diagnosis Date  . DIABETES-TYPE 2 07/15/2008  . DIABETIC PERIPHERAL NEUROPATHY 09/02/2009  . HYPERTENSION 07/15/2008  . ASTHMA 07/15/2008  . GERD 07/15/2008  . PVD (peripheral vascular disease) (HCC) 06/18/09    RCE  . Normal cardiac stress test     low risk Nuc 2009, Feb 2011  . COPD (chronic obstructive pulmonary disease) [facility])    Past Surgical History  Procedure Laterality Date  . Appendectomy  1971  . Abdominal hysterectomy  1971  . Tonsillectomy    .  Tubal ligation    . Cesarean section    . Flexible sigmoidoscopy      diverticulitis  . Laparotomy      X 3 with adhesions lysis  . Foot surgery      right foot X 2  . Carotid endarterectomy  06/18/09    RCE   Family History  Problem Relation Age of Onset  . Diabetes Mother   . Arthritis Mother   . Heart disease Mother   . Heart disease Father   . Celiac disease Sister   . Stroke Maternal Grandfather   . Cancer Paternal Grandfather     lung  . Heart disease Sister   . Heart disease Sister   . Asthma Sister   . Heart disease Brother   . Cancer Brother 7    lung cancer   Social History  Substance Use Topics  . Smoking status: Never Smoker   . Smokeless tobacco: Never Used  . Alcohol Use: No   OB History    No data available     Review of Systems  Gastrointestinal: Positive for vomiting and diarrhea.   10 Systems reviewed and are negative for acute change except as noted in the HPI.    Allergies  Doxycycline hyclate; Iohexol; Morphine sulfate; and Moxifloxacin  Home Medications   Prior to Admission medications   Medication Sig Start Date End Date Taking? Authorizing Provider  albuterol (ACCUNEB) 1.25 MG/3ML nebulizer solution Take 1 ampule by nebulization every 6 (six) hours as needed for wheezing.    Historical Provider, MD  amitriptyline (ELAVIL) 25 MG tablet Take 1 tablet (25 mg total) by mouth at bedtime. 01/15/15   [name], MD  aspirin 81 MG tablet Take 81 mg by mouth daily.      Historical Provider, MD  B-D ULTRAFINE III SHORT PEN 31G X 8 MM MISC USE AS DIRECTED 09/04/13   [name], MD  Calcium Citrate (CITRACAL PO) Take 1 tablet by mouth 2 (two) times daily.     Historical Provider, MD  canagliflozin (INVOKANA) 300 MG TABS tablet Take 300 mg by mouth daily before breakfast. 09/18/14   [name], MD  carvedilol (COREG) 25 MG tablet Take 1 tablet (25 mg total) by mouth 2 (two) times daily with a meal. 12/09/13   Runell Gess, MD   Cholecalciferol (VITAMIN D3) 2000 UNITS TABS Take 2,000 Units by mouth daily.     Historical Provider, MD  CLEVER CHEK AUTO-CODE VOICE test strip  05/07/11   Historical Provider, MD  diltiazem (CARTIA XT) 120 MG 24 hr capsule Take 1 capsule (120 mg total) by mouth daily. NEEDS APPOINTMENT FOR FUTURE REFILLS 09/28/14   Runell Gess, MD  DULoxetine (CYMBALTA) 60 MG capsule TAKE 1 CAPSULE BY MOUTH DAILY 01/15/15   [name], MD  hydrocortisone (ANUSOL-HC) 25 MG suppository Place 1 suppository (25 mg total) rectally 2 (two) times daily. 03/25/15   [name], MD  Insulin Detemir (LEVEMIR FLEXTOUCH) 100 UNIT/ML Pen Inject 40 Units into the skin 2 (two) times daily. 09/10/14   [name], MD  insulin lispro (HUMALOG) 100 UNIT/ML KiwkPen INJECT 20 UNITS AT BREAKFAST, 10 UNITS AT LUNCH, AND 20 UNITS WITH SUPPER. 03/26/15   [name], MD  Lancets MISC  05/07/11   Historical Provider, MD  levothyroxine (SYNTHROID, LEVOTHROID) 50 MCG tablet Take 1 tablet (50 mcg total) by mouth daily before breakfast. 12/08/14   [name], MD  magnesium oxide (MAG-OX) 400 (241.3 MG) MG tablet TAKE 1 TABLET BY MOUTH DAILY    [name], MD  meclizine (ANTIVERT) 12.5 MG tablet Take 1 tablet (12.5 mg total) by mouth every 6 (six) hours as needed for dizziness. 12/25/14   [name], MD  metFORMIN (GLUCOPHAGE) 1000 MG tablet Take 1 tablet (1,000 mg total) by mouth 2 (two) times daily with a meal. 12/16/13   [name], MD  metFORMIN (GLUCOPHAGE) 1000 MG tablet TAKE 1 TABLET BY MOUTH TWICE A DAY WITH MEAL 02/22/15   [name], MD  NOVOLOG FLEXPEN 100 UNIT/ML FlexPen INJECT 9 TO 14 UNITS UNDER THE SKIN 2 TIMES DAILY WITH A MEAL 9 UNITS BEFORE BREAKFAST AND 14 UNITS AFTER SUPPER 01/25/15   [name], MD  ondansetron (ZOFRAN ODT) 4 MG disintegrating tablet Take 1 tablet (4 mg total) by mouth every 8 (eight) hours as needed for nausea or vomiting. 04/09/14   Roxy Horseman, PA-C  pantoprazole (PROTONIX) 40 MG tablet TAKE 1 TABLET BY MOUTH TWICE DAILY 12/15/13   [name], MD  pregabalin (LYRICA) 150 MG capsule TAKE ONE CAPSULE BY MOUTH DAILY 12/25/14   [name], MD  promethazine (PHENERGAN) 12.5 MG tablet 1-2 tabs po q6h prn nausea 04/09/14   Jeoffrey Massed, MD  simvastatin (ZOCOR) 20 MG tablet TAKE 1 TABLET BY MOUTH AT BEDTIME 01/15/15   [name], MD  valsartan (DIOVAN) 160 MG tablet TAKE 1 TABLET (160 MG TOTAL) BY MOUTH DAILY. 12/14/14   [name], MD  BP 170/81 mmHg  Pulse 83  Temp(Src) 97.6 F (36.4 C) (Rectal)  Resp 22  SpO2 96% Physical Exam  Constitutional: She appears well-developed and well-nourished.  Mumbling, anxious appearing, mildly tachypneic, NAD  HENT:  Head: Normocephalic and atraumatic.  dry mucous membranes  Eyes: Conjunctivae are normal. Pupils are equal, round, and reactive to light.  Neck: Neck supple.  Cardiovascular: Normal rate, regular rhythm and normal heart sounds.   No murmur heard. Pulmonary/Chest: Breath sounds normal.  Mildly increased WOB w/ tachypnea but clear breath sounds B/l  Abdominal: Soft. Bowel sounds are normal. She exhibits no distension. There is no tenderness.  Musculoskeletal: She exhibits no edema.  Neurological: She is alert.  Oriented to person only, able to follow basic commands  Skin: Skin is warm and dry.  Nursing note and vitals reviewed.   ED Course  Procedures (including critical care time) Labs Review Labs Reviewed  COMPREHENSIVE METABOLIC PANEL - Abnormal; Notable for the following:    Glucose, Bld 199 (*)    BUN 22 (*)    Creatinine, Ser 1.02 (*)    Total Protein 8.3 (*)    GFR calc non Af Amer 52 (*)    GFR calc Af Amer 60 (*)    All other components within normal limits  CBC - Abnormal; Notable for the following:    WBC 12.0 (*)    RBC 5.40 (*)    MCH 25.7 (*)    RDW 16.6 (*)    All other components within normal limits  URINALYSIS,  ROUTINE W REFLEX MICROSCOPIC (NOT AT [facility]) - Abnormal; Notable for the following:    APPearance CLOUDY (*)    Glucose, UA >1000 (*)    Hgb urine dipstick TRACE (*)    Ketones, ur 40 (*)    Protein, ur 30 (*)    All other components within normal limits  URINE MICROSCOPIC-ADD ON - Abnormal; Notable for the following:    Squamous Epithelial / LPF 0-5 (*)    Bacteria, UA MANY (*)    All other components within normal limits  I-STAT CG4 LACTIC ACID, ED - Abnormal; Notable for the following:    Lactic Acid, Venous 2.04 (*)    All other components within normal limits  URINE CULTURE  LIPASE, BLOOD  HEMOGLOBIN A1C  LIPID PANEL    Imaging Review Ct Abdomen Pelvis Wo Contrast  03/31/2015  CLINICAL DATA:  Malaise with nausea and vomiting today. Disorientation. Initial encounter. EXAM: CT ABDOMEN AND PELVIS WITHOUT CONTRAST TECHNIQUE: Multidetector CT imaging of the abdomen and pelvis was performed following the standard protocol without IV contrast. COMPARISON:  CT 04/09/2014 and 08/23/2009. FINDINGS: Lower chest: Mild atelectasis and vascular crowding at both lung bases. There is no confluent airspace opacity or significant pleural effusion. Atherosclerosis and a small hiatal hernia noted. Hepatobiliary: Diffusely decreased hepatic density again noted consistent with steatosis. No focal abnormalities identified on noncontrast imaging. No evidence of gallstones, gallbladder wall thickening or biliary dilatation. Pancreas: Fatty replacement and atrophy. No acute findings or surrounding inflammation. Spleen: Normal in size without focal abnormality. Adrenals/Urinary Tract: Both adrenal glands appear normal. There is no evidence of urinary tract calculus or perinephric soft tissue stranding. There is mild collecting system dilatation bilaterally, probably related to distension of the urinary bladder. No focal bladder abnormality seen. Retroperitoneal calcification on the right (image 50) appears unchanged,  within the gonadal vein. Stomach/Bowel: No evidence of bowel wall thickening, distention or surrounding inflammatory change. Mild diverticular changes throughout the colon.  Vascular/Lymphatic: There are no enlarged abdominal or pelvic lymph nodes. Moderate aortoiliac atherosclerosis noted. Reproductive: Hysterectomy.  No evidence of adnexal mass. Other: Possible injection changes within the subcutaneous fat of the anterior abdominal wall. Musculoskeletal: No acute or significant osseous findings. There are diffuse degenerative changes throughout the spine. IMPRESSION: 1. No acute findings or explanation for the patient's symptoms. 2. Bladder distention, likely accounting for mild intrarenal collecting system dilatation. No evidence of urinary tract calculus or secondary signs of ureteral obstruction. 3. Hepatic steatosis, colonic diverticulosis and atherosclerosis noted. Electronically Signed   By: Carey Bullocks M.D.   On: 03/31/2015 17:43   Dg Chest 2 View  03/31/2015  CLINICAL DATA:  Shortness of breath, altered mental status EXAM: CHEST  2 VIEW COMPARISON:  11/05/2014 FINDINGS: Stable mild cardiomegaly. No focal pneumonia, collapse or consolidation. Negative for edema, effusion, or pneumothorax. Trachea midline. Monitor leads overlie the chest. No acute osseous finding. IMPRESSION: Cardiomegaly without acute process.  No interval change. Electronically Signed   By: Judie Petit.  Shick M.D.   On: 03/31/2015 15:53   Ct Head Wo Contrast  03/31/2015  CLINICAL DATA:  Disoriented with nausea and vomiting. EXAM: CT HEAD WITHOUT CONTRAST TECHNIQUE: Contiguous axial images were obtained from the base of the skull through the vertex without intravenous contrast. COMPARISON:  August 22, 2009 FINDINGS: There is mild diffuse atrophy. There is no intracranial mass, hemorrhage, extra-axial fluid collection, or midline shift. There is evidence of a prior infarct in the posterior inferior left occipital lobe. In comparison with  the prior study, there is now decreased attenuation in the posterior medial right occipital lobe, concerning for recent infarct. Elsewhere, there is patchy small vessel disease throughout the centra semiovale bilaterally. The bony calvarium appears intact. The mastoid air cells are clear. There is an air-fluid level in the right maxillary antrum. There is mucosal thickening in the left maxillary antrum. No intraorbital lesions are appreciable. IMPRESSION: Findings concerning for recent/ possibly acute infarct in the medial posterior right occipital lobe. Prior infarct posterior left occipital lobe. There is atrophy with patchy small vessel disease in the periventricular white matter bilaterally. No hemorrhage or mass effect. Sinusitis with air-fluid level in the right maxillary antrum. Electronically Signed   By: Bretta Bang III M.D.   On: 03/31/2015 16:44   I have personally reviewed and evaluated these lab results as part of my medical decision-making.   EKG Interpretation   Date/Time:  Wednesday March 31 2015 13:34:19 EST Ventricular Rate:  87 PR Interval:  182 QRS Duration: 71 QT Interval:  382 QTC Calculation: 459 R Axis:   13 Text Interpretation:  Sinus rhythm No significant change since last  tracing Confirmed by LITTLE MD, RACHEL [phone]) on 03/31/2015 3:06:33 PM     Medications  sodium chloride 0.9 % bolus 500 mL (not administered)  ondansetron (ZOFRAN) injection 4 mg (4 mg Intravenous Given 03/31/15 1357)    MDM   Final diagnoses:  Posterior circulation stroke (HCC)  Confusion   Patient presents with 1 day of confusion and disorientation at noted by family at home. No reports of fever. On exam, patient with hypertension but afebrile and O2 sat stable on RA. Mild tachypnea and anxiousness on exam but no abnormal breath sounds. No abdominal tenderness to palpation. Obtained above labs including UA, lactate, urine culture. EKG shows sinus rhythm with no ischemic changes.  Also obtained chest x-ray. Gave patient small fluid bolus; she had already received NS and zofran from EMS.  Labs showed  mild dehydration in UA but no infection, lactate 2.04, stable CMP. CXR negative acute. Obtained CT abd/pelvis given complaints of abd pain and vomiting; CT negative acute. Obtained CT head which showed acute vs subacute medial posterior R occipital lobe infarct. Discussed w/ Dr. Lu DuffelStuart w/ neurology. Discussed admission w/ [facility] hospital[facility] and patient [facility] per neurology request for further work up.    Laurence Spatesachel Morgan Little, MD 04/01/15 [phone]

## 2015-03-31 NOTE — Consult Note (Signed)
Neurology Consultation Reason for Consult: Altered Mental Status Referring Physician: Thurman Coyer  CC: ams  History is obtained from:patient, husband  HPI: Casey Harrell is a 80 y.o. female who presentedi with confusion and agitation. She has been having some diarrhea at home and had a mild leukocytosis and fever after arrival here. She received ativan and haldol for agitation earlier. Per her husband, she has mild dementia at baseline. She has difficulty remembering things that happened in the previous days but still is able to interact appropriately most of the time. She went to bed normal last night, but when she woke this morning she was acting confused. Of note, she was having diarrhea for the past 2 days, and has had some nausea today.  She is only moderately cooperative with examination and history.  LKW: 11/22 prior to bed tpa given?: no, delay in arrival    ROS: Unable to obtain due to altered mental status   Past Medical History  Diagnosis Date  . DIABETES-TYPE 2 07/15/2008  . DIABETIC PERIPHERAL NEUROPATHY 09/02/2009  . HYPERTENSION 07/15/2008  . ASTHMA 07/15/2008  . GERD 07/15/2008  . PVD (peripheral vascular disease) (HCC) 06/18/09    RCE  . Normal cardiac stress test     low risk Nuc 2009, Feb 2011  . COPD (chronic obstructive pulmonary disease) (HCC)      Family History  Problem Relation Age of Onset  . Diabetes Mother   . Arthritis Mother   . Heart disease Mother   . Heart disease Father   . Celiac disease Sister   . Stroke Maternal Grandfather   . Cancer Paternal Grandfather     lung  . Heart disease Sister   . Heart disease Sister   . Asthma Sister   . Heart disease Brother   . Cancer Brother 74    lung cancer     Social History:  reports that she has never smoked. She has never used smokeless tobacco. She reports that she does not drink alcohol. Her drug history is not on file.   Exam: Current vital signs: BP 206/107 mmHg  Pulse 117  Temp(Src) 98.1  F (36.7 C) (Oral)  Resp 26  SpO2 95% Vital signs in last 24 hours: Temp:  [97.6 F (36.4 C)-100.6 F (38.1 C)] 98.1 F (36.7 C) (11/23 2041) Pulse Rate:  [83-117] 117 (11/23 2041) Resp:  [19-27] 26 (11/23 2041) BP: (170-221)/(81-111) 206/107 mmHg (11/23 2041) SpO2:  [87 %-100 %] 95 % (11/23 2041)   Physical Exam  Constitutional: Appears elderly Psych: Affect appropriate to situation Eyes: No scleral injection HENT: No OP obstrucion Head: Normocephalic.  Cardiovascular: Normal rate and regular rhythm.  Respiratory: Effort normal and breath sounds normal to anterior ascultation GI: Soft.  No distension. There is mild diffuse tenderness.  Skin: WDI  Neuro: Mental Status: Patient is awake, alert, she is not oriented. She does recognize her husband and addresses him by name. No signs of aphasia or neglect Cranial Nerves: II: She answers "1" whenever asked how many fingers, but does blink to threat bilaterally  Pupils are equal, round, and reactive to light.   III,IV, VI: EOMI without ptosis or diploplia.  V: Facial sensation is symmetric to temperature VII: Facial movement is symmetric.  VIII: hearing is intact to voice X: Uvula elevates symmetrically XI: Does not comply XII: tongue is midline without atrophy or fasciculations.  Motor: Tone is normal. Bulk is normal. She does not comply with formal strength testing but appears to  move all extremities well.  Sensory: She endorses symmetric sensation in arms and legs  Deep Tendon Reflexes: 2+ and symmetric in the biceps and patellae.  Cerebellar: She does not comply  I have reviewed labs in epic and the results pertinent to this consultation are: Lactic acidosis  I have reviewed the images obtained: CT head - right medial occipital lobe infarct could be acute or chronic.   Impression: 80 yo F with delirium in the setting of what I suspect is a gastroenteritis and possible acute stroke. One other possibility given her BP  would be PRES/hypertensive encephalopathy. At this time, I think that an MRI could be most helpful in determining BP goals etc, and have asked for it to be completed.   Recommendations: 1) MRI brain 2) If positive for stroke, would allow permissive hypertension and perform stroke workup.  3) If negative, then would treat for hypertensive urgency and gastroenteritis.  4) Will continue to follow.    Ritta SlotMcNeill Rondalyn Belford, MD [facility] [phone]  If 7pm- 7am, please page neurology on call as listed in AMION.

## 2015-03-31 NOTE — ED Notes (Signed)
[facility] called. 

## 2015-03-31 NOTE — ED Notes (Signed)
Bed: [id number] Expected date:  Expected time:  Means of arrival:  Comments: EMS- 79yo F, n/v, AMS

## 2015-03-31 NOTE — ED Notes (Addendum)
Per EMS, pt from home.  Pt family got up this morning not feeling well.  N/V.  Disoriented at times. Pt is responding to verbal stimulation.   Clammy on initial EMS assessment.  500 cc NS given in rfa wrist.  Zofran 4mg  given in route.  Pt is under treatment for vertigo and has some nausea meds at home.  NS on monitor.  HTN in route.  No unilateral changes noted neurologically.  Vitals:  194/116, hr 90, resp 22, 92% ra.  96% on 2l per [place].  cbg 219.

## 2015-03-31 NOTE — ED Notes (Signed)
RN and Dr.Little notified of pt's Lactic of 2.04

## 2015-04-01 ENCOUNTER — Inpatient Hospital Stay (HOSPITAL_COMMUNITY): Payer: Medicare Other

## 2015-04-01 DIAGNOSIS — I6789 Other cerebrovascular disease: Secondary | ICD-10-CM

## 2015-04-01 DIAGNOSIS — I160 Hypertensive urgency: Secondary | ICD-10-CM

## 2015-04-01 DIAGNOSIS — R410 Disorientation, unspecified: Secondary | ICD-10-CM

## 2015-04-01 DIAGNOSIS — I639 Cerebral infarction, unspecified: Secondary | ICD-10-CM | POA: Insufficient documentation

## 2015-04-01 DIAGNOSIS — I63412 Cerebral infarction due to embolism of left middle cerebral artery: Secondary | ICD-10-CM

## 2015-04-01 DIAGNOSIS — E1100 Type 2 diabetes mellitus with hyperosmolarity without nonketotic hyperglycemic-hyperosmolar coma (NKHHC): Secondary | ICD-10-CM

## 2015-04-01 DIAGNOSIS — R509 Fever, unspecified: Secondary | ICD-10-CM

## 2015-04-01 DIAGNOSIS — G9340 Encephalopathy, unspecified: Secondary | ICD-10-CM

## 2015-04-01 DIAGNOSIS — I6350 Cerebral infarction due to unspecified occlusion or stenosis of unspecified cerebral artery: Secondary | ICD-10-CM

## 2015-04-01 DIAGNOSIS — J9601 Acute respiratory failure with hypoxia: Secondary | ICD-10-CM

## 2015-04-01 LAB — GLUCOSE, CAPILLARY
Glucose-Capillary: 180 mg/dL — ABNORMAL HIGH (ref 65–99)
Glucose-Capillary: 229 mg/dL — ABNORMAL HIGH (ref 65–99)
Glucose-Capillary: 226 mg/dL — ABNORMAL HIGH (ref 65–99)
Glucose-Capillary: 191 mg/dL — ABNORMAL HIGH (ref 65–99)

## 2015-04-01 LAB — LIPID PANEL
Cholesterol: 137 mg/dL (ref 0–200)
HDL: 26 mg/dL — ABNORMAL LOW (ref >40)
LDL Cholesterol: 76 mg/dL (ref 0–99)
Total CHOL/HDL Ratio: 5.3 RATIO
Triglycerides: 174 mg/dL — ABNORMAL HIGH (ref <150)
VLDL: 35 mg/dL (ref 0–40)

## 2015-04-01 MED ORDER — SODIUM CHLORIDE 0.9 % IV SOLN
INTRAVENOUS | Status: DC
  Administered 2015-04-01 – 2015-04-02 (×2): via INTRAVENOUS

## 2015-04-01 MED ORDER — ASPIRIN EC 81 MG PO TBEC
81.0000 mg | DELAYED_RELEASE_TABLET | Freq: Every day | ORAL | Status: DC
  Administered 2015-04-01 – 2015-04-05 (×5): 81 mg via ORAL
  Filled 2015-04-01 (×5): qty 1

## 2015-04-01 MED ORDER — SIMVASTATIN 20 MG PO TABS
20.0000 mg | ORAL_TABLET | Freq: Every day | ORAL | Status: DC
  Filled 2015-04-01: qty 1

## 2015-04-01 MED ORDER — ONDANSETRON 4 MG PO TBDP: 4.0000 mg | ORAL_TABLET | Freq: Three times a day (TID) | ORAL | Status: DC | PRN

## 2015-04-01 MED ORDER — LORAZEPAM 2 MG/ML IJ SOLN
1.0000 mg | Freq: Once | INTRAMUSCULAR | Status: AC
  Administered 2015-04-01: 1 mg via INTRAVENOUS

## 2015-04-01 MED ORDER — CLOPIDOGREL BISULFATE 75 MG PO TABS: 75.0000 mg | ORAL_TABLET | Freq: Every day | ORAL | Status: DC

## 2015-04-01 MED ORDER — INSULIN DETEMIR 100 UNIT/ML ~~LOC~~ SOLN
30.0000 [IU] | Freq: Two times a day (BID) | SUBCUTANEOUS | Status: DC
  Administered 2015-04-01 – 2015-04-05 (×9): 30 [IU] via SUBCUTANEOUS
  Filled 2015-04-01 (×12): qty 0.3

## 2015-04-01 MED ORDER — LORAZEPAM 2 MG/ML IJ SOLN
INTRAMUSCULAR | Status: AC
  Filled 2015-04-01: qty 1

## 2015-04-01 MED ORDER — MECLIZINE HCL 12.5 MG PO TABS: 12.5000 mg | ORAL_TABLET | Freq: Four times a day (QID) | ORAL | Status: DC | PRN

## 2015-04-01 NOTE — Progress Notes (Signed)
STROKE TEAM PROGRESS NOTE   HISTORY Casey Harrell is a 80 y.o. female who presented with confusion and agitation. She has been having some diarrhea at home and had a mild leukocytosis and fever after arrival here. She received ativan and haldol for agitation earlier. Per her husband, she has mild dementia at baseline. She has difficulty remembering things that happened in the previous days but still is able to interact appropriately most of the time. She went to bed normal last night, but when she woke this morning she was acting confused. Of note, she was having diarrhea for the past 2 days, and has had some nausea today. She is only moderately cooperative with examination and history. She is LKW on 11/22 prior to bed.  Patient was not administered TPA secondary to delay in arrival.  She was admitted for further evaluation and treatment.   SUBJECTIVE (INTERVAL HISTORY) Her husband is at the bedside.  Overall she feels her condition is stable, though she still remains confused and restless. husbnad reports BP typically 125/80 in the am and increased to the 180s by late afternoon. She does have mild dementia with memory deficit but seems this time is more than usual. No fever this admission, Tmax 100.6.  BP relatively high this admission.    OBJECTIVE Temp:  [97.6 F (36.4 C)-100.6 F (38.1 C)] 97.9 F (36.6 C) (11/24 0100) Pulse Rate:  [83-117] 110 (11/24 0500) Cardiac Rhythm:  [-] Sinus tachycardia (11/24 0825) Resp:  [16-27] 16 (11/24 0825) BP: (154-221)/(79-111) 154/91 mmHg (11/24 0825) SpO2:  [87 %-100 %] 94 % (11/24 0825) Weight:  [65.4 kg (144 lb 2.9 oz)] 65.4 kg (144 lb 2.9 oz) (11/23 2041)  CBC:  Recent Labs Lab 03/25/15 1231 03/31/15 1419  WBC 10.9* 12.0*  NEUTROABS 5.0  --   HGB 12.4 13.9  HCT 39.4 43.2  MCV 80.3 80.0  PLT 288.0 300    Basic Metabolic Panel:  Recent Labs Lab 03/25/15 1231 03/31/15 1419  NA 138 140  K 4.9 4.1  CL 102 103  CO2 30 25  GLUCOSE 120*  199*  BUN 19 22*  CREATININE 1.09 1.02*  CALCIUM 9.7 9.5    Lipid Panel:    Component Value Date/Time   CHOL 137 04/01/2015 0422   TRIG 174* 04/01/2015 0422   HDL 26* 04/01/2015 0422   CHOLHDL 5.3 04/01/2015 0422   VLDL 35 04/01/2015 0422   LDLCALC 76 04/01/2015 0422   HgbA1c:  Lab Results  Component Value Date   HGBA1C 9.0* 03/25/2015   Urine Drug Screen: No results found for: LABOPIA, COCAINSCRNUR, LABBENZ, AMPHETMU, THCU, LABBARB    IMAGING I have personally reviewed the radiological images below and agree with the radiology interpretations.  Ct Abdomen Pelvis Wo Contrast 03/31/2015  1. No acute findings or explanation for the patient's symptoms. 2. Bladder distention, likely accounting for mild intrarenal collecting system dilatation. No evidence of urinary tract calculus or secondary signs of ureteral obstruction. 3. Hepatic steatosis, colonic diverticulosis and atherosclerosis noted.   Dg Chest 2 View 03/31/2015   Cardiomegaly without acute process.  No interval change.   Ct Head Wo Contrast 03/31/2015  Findings concerning for recent/ possibly acute infarct in the medial posterior right occipital lobe. Prior infarct posterior left occipital lobe. There is atrophy with patchy small vessel disease in the periventricular white matter bilaterally. No hemorrhage or mass effect. Sinusitis with air-fluid level in the right maxillary antrum.    MRI HEAD  04/01/2015  IMPRESSION: 1.  Small patchy acute ischemic infarcts involving the left periatrial white matter and left temporoparietal region. 2. Remote bilateral occipital infarcts, left greater than right, with scattered remote lacunar infarcts as above. 3. Cerebral atrophy with moderate chronic small vessel ischemic disease.  MRA HEAD  04/01/2015  IMPRESSION: 1. No large or proximal arterial branch occlusion. 2. Focal high-grade stenosis within the proximal left M1 segment. 3. Extensive atheromatous throughout the posterior  circulation, with focal severe stenoses within the left V4 segment, proximal right SCA, and mid left P2 segment.   PHYSICAL EXAM  Temp:  [97.6 F (36.4 C)-100.6 F (38.1 C)] 98.6 F (37 C) (11/24 1049) Pulse Rate:  [83-117] 83 (11/24 1049) Resp:  [16-27] 18 (11/24 1049) BP: (119-221)/(52-111) 119/52 mmHg (11/24 1049) SpO2:  [87 %-100 %] 96 % (11/24 1049) Weight:  [144 lb 2.9 oz (65.4 kg)] 144 lb 2.9 oz (65.4 kg) (11/23 2041)  General - Well nourished, well developed, restless.  Ophthalmologic - Fundi not visualized due to noncooperation.  Cardiovascular - Regular rhythm, mild tachycardia.  Mental Status -  Awake, alert, orientated to place and self and people, but not to time or president. Following commands, normal expression, able to repeat, but name 3/4 objects Decreased fund of knowledge.  Cranial Nerves II - XII - II - Visual field intact OU. III, IV, VI - Extraocular movements intact. V - Facial sensation intact bilaterally. VII - Facial movement intact bilaterally. VIII - Hearing & vestibular intact bilaterally. X - Palate elevates symmetrically. XI - Chin turning & shoulder shrug intact bilaterally. XII - Tongue protrusion intact.  Motor Strength - The patient's strength was normal in all extremities and pronator drift was absent.  Bulk was normal and fasciculations were absent.   Motor Tone - Muscle tone was assessed at the neck and appendages and was normal.  Reflexes - The patient's reflexes were 1+ in all extremities and she had no pathological reflexes.  Sensory - Light touch, temperature/pinprick were assessed and were symmetrical.    Coordination - The patient had normal movements in the hands and feet with no ataxia or dysmetria.  Tremor was absent.  Gait and Station - not tested due to safety concerns.   ASSESSMENT/PLAN [name] is a 80 y.o. female with history of DM, hypertensilon, asthma, PVD, COPD presenting with agitation and increased  confusion following diarrhea x 2 days at home. She did not receive IV t-PA due to delay in arrival.   Altered mental status and confusion  UA negative  CXR negative  Low grade temp 100.6 x1  EEG pending  Consider LP if no improvement.  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 Strokes do not explain agitation and confusion.  MRI  Small patchy left parietal white matter and left temporoparietal region infarcts. Remote bilateral occipital infarcts, left greater than right. scattered remote lacunes. Atrophy. chronic small vessel disease.  MRA  Focal L M1 stenosis.  focal severe stenoses L V4 segment, proximal R SCA, and mid L P2 segment.  Carotid Doppler  pending   2D Echo  pending   LDL 76  HgbA1c 9.0  Lovenox 40 mg sq daily for VTE prophylaxis  Diet Heart Room service appropriate?: Yes; Fluid consistency:: Thin  aspirin 81 mg daily prior to admission, now on aspirin 325 mg daily. Given M1 stenosis, will change to  aspirin 81 mg and clopidogrel  75 mg orally every day x 3 months for secondary stroke prevention after LP is completed. After 3 months, change to plavix alone. Long-term dual antiplatelets are contraindicated due to risk for intracerebral hemorrhage.  Recommend outpatient 30 day cardiac event monitoring to rule out afib.   Patient and husband counseled to be compliant with her antithrombotic medications  Ongoing aggressive stroke risk factor management  Therapy recommendations:  pending   Disposition:  pending   Hypertensive Urgency ?PRES  BP elevated  BP goal gradually normalize in 5-7 days  On cardizem, coreg, avapro  Hyperlipidemia  Home meds:  No statin  LDL 76, goal < 70  Added lipitor 10 mg this admission  Continue statin at discharge  Diabetes type II  HgbA1c 9.0, goal < 7.0  Uncontrolled  On levemir  SSI  CBG  monitoring  Other Stroke Risk Factors  Advanced age  Family hx stroke (materal grandfather)  PVD  Other Active Problems  COPD  Hypoxia  Mild dementia at baseline  Hospital day # 1  The patient is encephalopathic now and at risk for recurrent strokes and TIAs and neurological worsening and she needs ongoing stroke evaluation and aggressive risk factor modification.   Marvel Plan, MD PhD Stroke Neurology 04/01/2015 12:38 PM    To contact Stroke Continuity provider, please refer to [email or web address]. After hours, contact General Neurology

## 2015-04-01 NOTE — Progress Notes (Signed)
PNote  Patient Details Name: Casey Harrell MRN: [id number] DOB: 08-13-1934    Treatment:     checked on pt, pt having in room having test at time. Spoke with nurse, pt is up in chair today, no DC plans today, will see pt tomorrow. Thanks   Marella BileBRITT, Lathan Gieselman 04/01/2015, 3:23 PM

## 2015-04-01 NOTE — Progress Notes (Signed)
[facility] PROGRESS NOTE    Progress Note   Casey Harrell UXL:[id number] DOB: [birth year] DOA: 03/31/2015 PCP: [name], MD   Brief Narrative:   Casey Harrell is an 80 y.o. female past medical history of diabetes hypertension and COPD is brought in by the family because of confusion as soon as she woke up in the morning which progressively got worse throughout the day with agitation.  Assessment/Plan:   Acute ischemic stroke [facility]): A1c 9.0, fasting lipid panel  MRI, MRA of the brain without contrast showed a left paratrial infarct and remote occipital infarcts. PT consult, OT consult, Speech consult  Echocardiogram and Carotid dopplers pending Prophylactic therapy-Antiplatelet med: She will need aspirin and Plavix for 3 months for secondary prevention after LP is completed. Cardiac Monitoring with no events. Neurochecks q4h  Keep MAP 70 LP if no improvement and EEG pending, appreciate Stroke team. She will need a loop recorder as an outpatient to rule out atrial fibrillation.  Acute encephalopathy: Unclear etiology of why she is confused. She doesn't have a past medical history of dementia. Respiratory family members is not her baseline. She has a petechial rash on her left upper extremity, she is not thrombocytopenic, has a mild leukocytosis lactic acid 2 and a mild fever on admission.   Fever in adult: Aspiration can also cause a fever, her UA shows many bacteria pseudo-5 white blood cells, chest x-ray does not show any infiltrates. CT scan of the abdomen pelvis does not show any findings to explain the infection. She has a rash in upper extremity, she has mild leukocytosis with a lactic acid of 2 with confusion. Leukocytosis Questionably due to stroke. She has remained afebrile off antibiotics.  Type 2 diabetes mellitus, uncontrolled (HCC): Decrease earlier to 30 units twice today continue sliding-scale insulin poorly controlled diabetes mellitus with hemoglobin  A1c of 9. We'll try to do further titration of her insulin as an inpatient.  Sleep apnea: She cannot tolerate C-pap   Acute respiratory failure with hypoxia (HCC) - Unclear etiology now resolved. Question is due to Haldol as it was a single episode, she is on 1 L of oxygen at night, chest x-ray does not show any infiltrate. She did had an episode of vomiting, and she has mild elevation in white count question aspiration. She has remained afebrile since then and continue to hold her antibiotic coverage.  Hypertensive urgency Blood pressure has been slowly improving on oral medications.  Depression: Continue current home regimen.   DVT Prophylaxis - Lovenox ordered.  Family Communication: husband Disposition Plan: Home in 2-3 days Code Status:     Code Status Orders        Start     Ordered   03/31/15 2048  Full code   Continuous     03/31/15 2047        IV Access:    Peripheral IV   Procedures and diagnostic studies:   Ct Abdomen Pelvis Wo Contrast  03/31/2015  CLINICAL DATA:  Malaise with nausea and vomiting today. Disorientation. Initial encounter. EXAM: CT ABDOMEN AND PELVIS WITHOUT CONTRAST TECHNIQUE: Multidetector CT imaging of the abdomen and pelvis was performed following the standard protocol without IV contrast. COMPARISON:  CT 04/09/2014 and 08/23/2009. FINDINGS: Lower chest: Mild atelectasis and vascular crowding at both lung bases. There is no confluent airspace opacity or significant pleural effusion. Atherosclerosis and a small hiatal hernia noted. Hepatobiliary: Diffusely decreased hepatic density again noted consistent with steatosis. No focal abnormalities identified on noncontrast  imaging. No evidence of gallstones, gallbladder wall thickening or biliary dilatation. Pancreas: Fatty replacement and atrophy. No acute findings or surrounding inflammation. Spleen: Normal in size without focal abnormality. Adrenals/Urinary Tract: Both adrenal glands appear  normal. There is no evidence of urinary tract calculus or perinephric soft tissue stranding. There is mild collecting system dilatation bilaterally, probably related to distension of the urinary bladder. No focal bladder abnormality seen. Retroperitoneal calcification on the right (image 50) appears unchanged, within the gonadal vein. Stomach/Bowel: No evidence of bowel wall thickening, distention or surrounding inflammatory change. Mild diverticular changes throughout the colon. Vascular/Lymphatic: There are no enlarged abdominal or pelvic lymph nodes. Moderate aortoiliac atherosclerosis noted. Reproductive: Hysterectomy.  No evidence of adnexal mass. Other: Possible injection changes within the subcutaneous fat of the anterior abdominal wall. Musculoskeletal: No acute or significant osseous findings. There are diffuse degenerative changes throughout the spine. IMPRESSION: 1. No acute findings or explanation for the patient's symptoms. 2. Bladder distention, likely accounting for mild intrarenal collecting system dilatation. No evidence of urinary tract calculus or secondary signs of ureteral obstruction. 3. Hepatic steatosis, colonic diverticulosis and atherosclerosis noted. Electronically Signed   By: Carey Bullocks M.D.   On: 03/31/2015 17:43   Dg Chest 2 View  03/31/2015  CLINICAL DATA:  Shortness of breath, altered mental status EXAM: CHEST  2 VIEW COMPARISON:  11/05/2014 FINDINGS: Stable mild cardiomegaly. No focal pneumonia, collapse or consolidation. Negative for edema, effusion, or pneumothorax. Trachea midline. Monitor leads overlie the chest. No acute osseous finding. IMPRESSION: Cardiomegaly without acute process.  No interval change. Electronically Signed   By: Judie Petit.  Shick M.D.   On: 03/31/2015 15:53   Ct Head Wo Contrast  03/31/2015  CLINICAL DATA:  Disoriented with nausea and vomiting. EXAM: CT HEAD WITHOUT CONTRAST TECHNIQUE: Contiguous axial images were obtained from the base of the skull  through the vertex without intravenous contrast. COMPARISON:  August 22, 2009 FINDINGS: There is mild diffuse atrophy. There is no intracranial mass, hemorrhage, extra-axial fluid collection, or midline shift. There is evidence of a prior infarct in the posterior inferior left occipital lobe. In comparison with the prior study, there is now decreased attenuation in the posterior medial right occipital lobe, concerning for recent infarct. Elsewhere, there is patchy small vessel disease throughout the centra semiovale bilaterally. The bony calvarium appears intact. The mastoid air cells are clear. There is an air-fluid level in the right maxillary antrum. There is mucosal thickening in the left maxillary antrum. No intraorbital lesions are appreciable. IMPRESSION: Findings concerning for recent/ possibly acute infarct in the medial posterior right occipital lobe. Prior infarct posterior left occipital lobe. There is atrophy with patchy small vessel disease in the periventricular white matter bilaterally. No hemorrhage or mass effect. Sinusitis with air-fluid level in the right maxillary antrum. Electronically Signed   By: Bretta Bang III M.D.   On: 03/31/2015 16:44   Mr Brain Wo Contrast  04/01/2015  CLINICAL DATA:  Initial evaluation for acute altered mental status, vertigo. EXAM: MRI HEAD WITHOUT CONTRAST MRA HEAD WITHOUT CONTRAST TECHNIQUE: Multiplanar, multiecho pulse sequences of the brain and surrounding structures were obtained without intravenous contrast. Angiographic images of the head were obtained using MRA technique without contrast. COMPARISON:  Prior CT from 03/31/2015. FINDINGS: MRI HEAD FINDINGS Study is severely limited by motion artifact. Diffuse prominence of the CSF containing spaces is compatible with generalized cerebral atrophy. Patchy T2/FLAIR hyperintensity within the periventricular and deep white matter of both cerebral hemispheres is present, consistent with moderate  chronic small  vessel ischemic disease. This appears grossly similar relative to previous MRI from 2011. Superimposed lacunar infarct within the deep white matter of the anterior right centrum semi ovale. Additional scattered remote lacunar infarcts within the bilateral thalami and basal ganglia. Encephalomalacia present within the bilateral occipital lobes, which may be related to remote ischemia. 9 mm acute ischemic infarct within the left periatrial white matter (series 6, image 23). Tiny infarct within the overlying cortical gray matter of the posterior left temporal occipital region. Few additional tiny ischemic infarcts within the deep white matter of the left parietal lobe (series 6, image 30, 28). No associated hemorrhage or mass effect. No other infarct. Few small chronic micro hemorrhages noted within the supratentorial and infratentorial brain, most prominent within the left paramedian pons (series 15, image 7), likely related to chronic underlying hypertension. No mass lesion, midline shift, or mass effect. No hydrocephalus. No extra-axial fluid collection. Craniocervical junction normal.  Pituitary gland normal. No acute abnormality about the orbits. Sequela prior bilateral lens extraction noted. Air-fluid level within the right maxillary sinus. Scattered opacity within the right sphenoid sinus. Small right mastoid effusion. Bone marrow signal intensity within normal limits. Scalp soft tissues demonstrate no acute abnormality. MRA HEAD FINDINGS ANTERIOR CIRCULATION: Visualized distal cervical segments of the internal carotid arteries are patent with antegrade flow. Petrous, cavernous, and supraclinoid segments are patent without flow-limiting stenosis. ICA termini patent. Left A1 segment well opacified. Right A1 segment hypoplastic. Anterior communicating artery and anterior cerebral arteries well opacified. Right M1 segment patent without stenosis or occlusion. Right MCA bifurcation normal. Distal right MCA branches  opacified. Focal severe high-grade stenosis at the proximal left M1 segment. M1 segment is opacified distally. MCA bifurcation normal on the left. Left MCA branches are fairly symmetric with the right and are well opacified. POSTERIOR CIRCULATION: Right vertebral artery dominant. Right vertebral artery demonstrates multi focal atheromatous irregularity without flow-limiting stenosis. There is a focal severe stenosis within the diminutive left V4 segment. Right PICA is patent. Left posterior inferior cerebral artery not visualized. Basilar artery widely patent. Superior cerebellar arteries opacified bilaterally. Short-segment severe stenosis at the proximal right superior cerebellar artery. Fetal origin of the right PCA with a small right posterior communicating artery. Atheromatous irregularity throughout the right PCA. Atheromatous irregularity with multifocal stenoses throughout the left PCA, with a fairly severe stenosis within the mid left P2 segment. No aneurysm. IMPRESSION: MRI HEAD IMPRESSION: 1. Small patchy acute ischemic infarcts involving the left periatrial white matter and left temporoparietal region. 2. Remote bilateral occipital infarcts, left greater than right, with scattered remote lacunar infarcts as above. 3. Cerebral atrophy with moderate chronic small vessel ischemic disease. MRA HEAD IMPRESSION: 1. No large or proximal arterial branch occlusion. 2. Focal high-grade stenosis within the proximal left M1 segment. 3. Extensive atheromatous throughout the posterior circulation, with focal severe stenoses within the left V4 segment, proximal right SCA, and mid left P2 segment. Electronically Signed   By: Rise MuBenjamin  McClintock M.D.   On: 04/01/2015 06:34   Mr Maxine GlennMra Head/brain Wo Cm  04/01/2015  CLINICAL DATA:  Initial evaluation for acute altered mental status, vertigo. EXAM: MRI HEAD WITHOUT CONTRAST MRA HEAD WITHOUT CONTRAST TECHNIQUE: Multiplanar, multiecho pulse sequences of the brain and  surrounding structures were obtained without intravenous contrast. Angiographic images of the head were obtained using MRA technique without contrast. COMPARISON:  Prior CT from 03/31/2015. FINDINGS: MRI HEAD FINDINGS Study is severely limited by motion artifact. Diffuse prominence of the CSF containing spaces  is compatible with generalized cerebral atrophy. Patchy T2/FLAIR hyperintensity within the periventricular and deep white matter of both cerebral hemispheres is present, consistent with moderate chronic small vessel ischemic disease. This appears grossly similar relative to previous MRI from 2011. Superimposed lacunar infarct within the deep white matter of the anterior right centrum semi ovale. Additional scattered remote lacunar infarcts within the bilateral thalami and basal ganglia. Encephalomalacia present within the bilateral occipital lobes, which may be related to remote ischemia. 9 mm acute ischemic infarct within the left periatrial white matter (series 6, image 23). Tiny infarct within the overlying cortical gray matter of the posterior left temporal occipital region. Few additional tiny ischemic infarcts within the deep white matter of the left parietal lobe (series 6, image 30, 28). No associated hemorrhage or mass effect. No other infarct. Few small chronic micro hemorrhages noted within the supratentorial and infratentorial brain, most prominent within the left paramedian pons (series 15, image 7), likely related to chronic underlying hypertension. No mass lesion, midline shift, or mass effect. No hydrocephalus. No extra-axial fluid collection. Craniocervical junction normal.  Pituitary gland normal. No acute abnormality about the orbits. Sequela prior bilateral lens extraction noted. Air-fluid level within the right maxillary sinus. Scattered opacity within the right sphenoid sinus. Small right mastoid effusion. Bone marrow signal intensity within normal limits. Scalp soft tissues demonstrate  no acute abnormality. MRA HEAD FINDINGS ANTERIOR CIRCULATION: Visualized distal cervical segments of the internal carotid arteries are patent with antegrade flow. Petrous, cavernous, and supraclinoid segments are patent without flow-limiting stenosis. ICA termini patent. Left A1 segment well opacified. Right A1 segment hypoplastic. Anterior communicating artery and anterior cerebral arteries well opacified. Right M1 segment patent without stenosis or occlusion. Right MCA bifurcation normal. Distal right MCA branches opacified. Focal severe high-grade stenosis at the proximal left M1 segment. M1 segment is opacified distally. MCA bifurcation normal on the left. Left MCA branches are fairly symmetric with the right and are well opacified. POSTERIOR CIRCULATION: Right vertebral artery dominant. Right vertebral artery demonstrates multi focal atheromatous irregularity without flow-limiting stenosis. There is a focal severe stenosis within the diminutive left V4 segment. Right PICA is patent. Left posterior inferior cerebral artery not visualized. Basilar artery widely patent. Superior cerebellar arteries opacified bilaterally. Short-segment severe stenosis at the proximal right superior cerebellar artery. Fetal origin of the right PCA with a small right posterior communicating artery. Atheromatous irregularity throughout the right PCA. Atheromatous irregularity with multifocal stenoses throughout the left PCA, with a fairly severe stenosis within the mid left P2 segment. No aneurysm. IMPRESSION: MRI HEAD IMPRESSION: 1. Small patchy acute ischemic infarcts involving the left periatrial white matter and left temporoparietal region. 2. Remote bilateral occipital infarcts, left greater than right, with scattered remote lacunar infarcts as above. 3. Cerebral atrophy with moderate chronic small vessel ischemic disease. MRA HEAD IMPRESSION: 1. No large or proximal arterial branch occlusion. 2. Focal high-grade stenosis within  the proximal left M1 segment. 3. Extensive atheromatous throughout the posterior circulation, with focal severe stenoses within the left V4 segment, proximal right SCA, and mid left P2 segment. Electronically Signed   By: Rise Mu M.D.   On: 04/01/2015 06:34     Medical Consultants:    None.  Anti-Infectives:   Anti-infectives    None      Subjective:    Casey Harrell she has no new complaints. She still complaining of dizziness.  Objective:    Filed Vitals:   04/01/15 0418 04/01/15 0500 04/01/15 0700 04/01/15 0825  BP: 154/80 170/79 162/81 154/91  Pulse: 102 110    Temp:      TempSrc:      Resp: 16 18 18 16   Height:      Weight:      SpO2: 96% 96% 95% 94%    Intake/Output Summary (Last 24 hours) at 04/01/15 0949 Last data filed at 03/31/15 1851  Gross per 24 hour  Intake   1000 ml  Output     15 ml  Net    985 ml   Filed Weights   03/31/15 2041  Weight: 65.4 kg (144 lb 2.9 oz)    Exam: Gen:  NAD Cardiovascular:  RRR, No M/R/G Chest and lungs:   CTAB Abdomen:  Abdomen soft, NT/ND, + BS Extremities:  No C/E/C Neuro: She is awake alert and oriented 3 with a coherent following language slow to response   Data Reviewed:    Labs: Basic Metabolic Panel:  Recent Labs Lab 03/25/15 1231 03/31/15 1419  NA 138 140  K 4.9 4.1  CL 102 103  CO2 30 25  GLUCOSE 120* 199*  BUN 19 22*  CREATININE 1.09 1.02*  CALCIUM 9.7 9.5   GFR Estimated Creatinine Clearance: 41 mL/min (by C-G formula based on Cr of 1.02). Liver Function Tests:  Recent Labs Lab 03/25/15 1231 03/31/15 1419  AST 29 34  ALT 22 31  ALKPHOS 87 84  BILITOT 0.4 0.6  PROT 7.2 8.3*  ALBUMIN 4.0 4.5    Recent Labs Lab 03/31/15 1419  LIPASE 19   No results for input(s): AMMONIA in the last 168 hours. Coagulation profile No results for input(s): INR, PROTIME in the last 168 hours.  CBC:  Recent Labs Lab 03/25/15 1231 03/31/15 1419  WBC 10.9* 12.0*  NEUTROABS  5.0  --   HGB 12.4 13.9  HCT 39.4 43.2  MCV 80.3 80.0  PLT 288.0 300   Cardiac Enzymes: No results for input(s): CKTOTAL, CKMB, CKMBINDEX, TROPONINI in the last 168 hours. BNP (last 3 results) No results for input(s): PROBNP in the last 8760 hours. CBG: No results for input(s): GLUCAP in the last 168 hours. D-Dimer: No results for input(s): DDIMER in the last 72 hours. Hgb A1c: No results for input(s): HGBA1C in the last 72 hours. Lipid Profile:  Recent Labs  04/01/15 0422  CHOL 137  HDL 26*  LDLCALC 76  TRIG 401*  CHOLHDL 5.3   Thyroid function studies: No results for input(s): TSH, T4TOTAL, T3FREE, THYROIDAB in the last 72 hours.  Invalid input(s): FREET3 Anemia work up: No results for input(s): VITAMINB12, FOLATE, FERRITIN, TIBC, IRON, RETICCTPCT in the last 72 hours. Sepsis Labs:  Recent Labs Lab 03/25/15 1231 03/31/15 1419 03/31/15 1542  WBC 10.9* 12.0*  --   LATICACIDVEN  --   --  2.04*   Microbiology Recent Results (from the past 240 hour(s))  Urine culture     Status: None (Preliminary result)   Collection Time: 03/31/15  3:27 PM  Result Value Ref Range Status   Specimen Description URINE, CATHETERIZED  Final   Special Requests Normal  Final   Culture   Final    >=100,000 COLONIES/mL GRAM NEGATIVE RODS Performed at [facility]    Report Status PENDING  Incomplete     Medications:   . amitriptyline  25 mg Oral QHS  . aspirin  325 mg Oral Daily  . atorvastatin  10 mg Oral q1800  . carvedilol  25 mg Oral BID WC  .  diltiazem  120 mg Oral Daily  . DULoxetine  60 mg Oral Daily  . enoxaparin (LOVENOX) injection  40 mg Subcutaneous Q24H  . hydrocortisone  25 mg Rectal BID  . insulin aspart  0-5 Units Subcutaneous QHS  . insulin aspart  0-9 Units Subcutaneous TID WC  . insulin detemir  40 Units Subcutaneous BID  . irbesartan  75 mg Oral Daily  . levothyroxine  50 mcg Oral QAC breakfast  . pantoprazole  40 mg Oral Daily  . pregabalin   150 mg Oral Daily   Continuous Infusions:   Time spent: 25 min   LOS: 1 day   Marinda Elk  [facility] Pager [phone]  *Please refer to [email or web address], password [email or web address] to get updated schedule on who will round on this patient, as hospitalists switch teams weekly. If 7PM-7AM, please contact night-coverage at [email or web address], password [email or web address] for any overnight needs.  04/01/2015, 9:49 AM

## 2015-04-01 NOTE — Progress Notes (Signed)
  Echocardiogram 2D Echocardiogram has been performed.  Delcie RochENNINGTON, Rennee Coyne 04/01/2015, 3:09 PM

## 2015-04-02 ENCOUNTER — Inpatient Hospital Stay (HOSPITAL_COMMUNITY): Payer: Medicare Other

## 2015-04-02 ENCOUNTER — Inpatient Hospital Stay (HOSPITAL_COMMUNITY)
Admit: 2015-04-02 | Discharge: 2015-04-02 | Disposition: A | Discharge status: Home / Self Care | Payer: Medicare Other | Attending: Internal Medicine | Admitting: Internal Medicine

## 2015-04-02 DIAGNOSIS — I63512 Cerebral infarction due to unspecified occlusion or stenosis of left middle cerebral artery: Secondary | ICD-10-CM

## 2015-04-02 DIAGNOSIS — I639 Cerebral infarction, unspecified: Secondary | ICD-10-CM

## 2015-04-02 DIAGNOSIS — G9340 Encephalopathy, unspecified: Secondary | ICD-10-CM

## 2015-04-02 DIAGNOSIS — I951 Orthostatic hypotension: Secondary | ICD-10-CM | POA: Insufficient documentation

## 2015-04-02 LAB — URINALYSIS W MICROSCOPIC (NOT AT ARMC)
Bilirubin Urine: NEGATIVE
Glucose, UA: >1000 mg/dL — AB
Hgb urine dipstick: NEGATIVE
Ketones, ur: NEGATIVE mg/dL
Nitrite: POSITIVE — AB
Protein, ur: NEGATIVE mg/dL
RBC / HPF: NONE SEEN RBC/hpf (ref 0–5)
Specific Gravity, Urine: 1.015 (ref 1.005–1.030)
pH: 5.5 (ref 5.0–8.0)

## 2015-04-02 LAB — CBC
HCT: 40.5 % (ref 36.0–46.0)
Hemoglobin: 12.6 g/dL (ref 12.0–15.0)
MCH: 25.3 pg — ABNORMAL LOW (ref 26.0–34.0)
MCHC: 31.1 g/dL (ref 30.0–36.0)
MCV: 81.3 fL (ref 78.0–100.0)
Platelets: 271 10*3/uL (ref 150–400)
RBC: 4.98 MIL/uL (ref 3.87–5.11)
RDW: 16.7 % — ABNORMAL HIGH (ref 11.5–15.5)
WBC: 10.1 10*3/uL (ref 4.0–10.5)

## 2015-04-02 LAB — BASIC METABOLIC PANEL
Anion gap: 7 (ref 5–15)
BUN: 31 mg/dL — ABNORMAL HIGH (ref 6–20)
CO2: 27 mmol/L (ref 22–32)
Calcium: 9.5 mg/dL (ref 8.9–10.3)
Chloride: 103 mmol/L (ref 101–111)
Creatinine, Ser: 1.16 mg/dL — ABNORMAL HIGH (ref 0.44–1.00)
GFR calc Af Amer: 51 mL/min — ABNORMAL LOW (ref >60)
GFR calc non Af Amer: 44 mL/min — ABNORMAL LOW (ref >60)
Glucose, Bld: 135 mg/dL — ABNORMAL HIGH (ref 65–99)
Potassium: 3.8 mmol/L (ref 3.5–5.1)
Sodium: 137 mmol/L (ref 135–145)

## 2015-04-02 LAB — URINE CULTURE
Culture: >=100000
Special Requests: NORMAL

## 2015-04-02 LAB — HEMOGLOBIN A1C
Hgb A1c MFr Bld: 8.9 % — ABNORMAL HIGH (ref 4.8–5.6)
Mean Plasma Glucose: 209 mg/dL

## 2015-04-02 LAB — RAPID URINE DRUG SCREEN, HOSP PERFORMED
Amphetamines: NOT DETECTED
Barbiturates: NOT DETECTED
Benzodiazepines: NOT DETECTED
Cocaine: NOT DETECTED
Opiates: NOT DETECTED
Tetrahydrocannabinol: NOT DETECTED

## 2015-04-02 LAB — GLUCOSE, CAPILLARY
Glucose-Capillary: 121 mg/dL — ABNORMAL HIGH (ref 65–99)
Glucose-Capillary: 170 mg/dL — ABNORMAL HIGH (ref 65–99)
Glucose-Capillary: 147 mg/dL — ABNORMAL HIGH (ref 65–99)
Glucose-Capillary: 261 mg/dL — ABNORMAL HIGH (ref 65–99)
Glucose-Capillary: 182 mg/dL — ABNORMAL HIGH (ref 65–99)

## 2015-04-02 LAB — AMMONIA: Ammonia: 20 umol/L (ref 9–35)

## 2015-04-02 MED ORDER — SODIUM CHLORIDE 0.9 % IV BOLUS (SEPSIS)
1000.0000 mL | Freq: Once | INTRAVENOUS | Status: AC
  Administered 2015-04-02: 1000 mL via INTRAVENOUS

## 2015-04-02 MED ORDER — CLOPIDOGREL BISULFATE 75 MG PO TABS
75.0000 mg | ORAL_TABLET | Freq: Every day | ORAL | Status: DC
  Administered 2015-04-02 – 2015-04-05 (×4): 75 mg via ORAL
  Filled 2015-04-02 (×4): qty 1

## 2015-04-02 MED ORDER — CARVEDILOL 6.25 MG PO TABS: 6.2500 mg | ORAL_TABLET | Freq: Two times a day (BID) | ORAL | Status: DC

## 2015-04-02 MED ORDER — ALBUMIN HUMAN 25 % IV SOLN
12.5000 g | Freq: Four times a day (QID) | INTRAVENOUS | Status: AC
  Administered 2015-04-02 – 2015-04-03 (×4): 12.5 g via INTRAVENOUS
  Filled 2015-04-02 (×4): qty 50

## 2015-04-02 MED ORDER — DEXTROSE 5 % IV SOLN
1.0000 g | INTRAVENOUS | Status: DC
  Administered 2015-04-02 – 2015-04-03 (×2): 1 g via INTRAVENOUS
  Filled 2015-04-02 (×3): qty 10

## 2015-04-02 NOTE — Progress Notes (Signed)
Upon rounding, patient noted changed in her speech, she appears aphasic at this time with right arm flaccid although able to wiggle her fingers. Last known normal time RN seen patient is at 1230pm. Dr. Roda ShuttersXu notified and Dr. Robb Matarrtiz aware. Orders received.  Sim BoastHavy, RN

## 2015-04-02 NOTE — Progress Notes (Signed)
BP 124/50 mmHg  Pulse 55  Temp(Src) 97.5 F (36.4 C) (Oral)  Resp 18  Ht 5\' 2"  (1.575 m)  Wt 65.4 kg (144 lb 2.9 oz)  BMI 26.36 kg/m2  SpO2 96% Patient developed an episode of syncope while sitting up in bed. She receive her Coreg 30 min before this episode. We try to check orthostatic and she passed out. It is likely orthostatic hypotension. DC'd all his blood pressure medication. We'll give her a liter of normal saline. EKG done showed sinus rhythm with no abnormal ST segment changes. She denies any chest pain or shortness of breath. Blood glucose was greater than 100.

## 2015-04-02 NOTE — Progress Notes (Signed)
VASCULAR LAB PRELIMINARY  PRELIMINARY  PRELIMINARY  PRELIMINARY  Carotid duplex  completed.    Preliminary report:  Bilateral:  1-39% ICA stenosis.  Vertebral artery flow is antegrade.      Yeshua Stryker, RVT 04/02/2015, 5:08 PM

## 2015-04-02 NOTE — Progress Notes (Signed)
Rehab Admissions Coordinator Note:  Patient was screened by Trish MageLogue, Mayrene Bastarache M for appropriateness for an Inpatient Acute Rehab Consult.  At this time, we are recommending Inpatient Rehab consult.  Trish MageLogue, Calissa Swenor M 04/02/2015, 1:43 PM  I can be reached at [phone].

## 2015-04-02 NOTE — Progress Notes (Signed)
STROKE TEAM PROGRESS NOTE   HISTORY ENGLISH Casey Harrell is a 80 y.o. female who presented with confusion and agitation. She has been having some diarrhea at home and had a mild leukocytosis and fever after arrival here. She received ativan and haldol for agitation earlier. Per her husband, she has mild dementia at baseline. She has difficulty remembering things that happened in the previous days but still is able to interact appropriately most of the time. She went to bed normal last night, but when she woke this morning she was acting confused. Of note, she was having diarrhea for the past 2 days, and has had some nausea today. She is only moderately cooperative with examination and history. She is LKW on 11/22 prior to bed.  Patient was not administered TPA secondary to delay in arrival.  She was admitted for further evaluation and treatment.   SUBJECTIVE (INTERVAL HISTORY) The patient's husband and son were at the bedside. Other family members also arrived. They report that the family was dizzy when she sat up in bed today. Probable dehydration related to diarrhea and poor PO intake. A fluid bolus has been ordered. The patient is more alert today and appears to be improving. She continues to have right upper extremity mild weakness. No need for lumbar puncture at this time. Plavix will be added to her aspirin.   OBJECTIVE Temp:  [97.3 F (36.3 C)-98.1 F (36.7 C)] 97.5 F (36.4 C) (11/25 1001) Pulse Rate:  [55-72] 55 (11/25 1001) Cardiac Rhythm:  [-] Sinus bradycardia (11/25 0700) Resp:  [18] 18 (11/25 1001) BP: (99-139)/(45-64) 124/50 mmHg (11/25 1001) SpO2:  [93 %-98 %] 96 % (11/25 1001)  CBC:   Recent Labs Lab 03/31/15 1419 04/02/15 0700  WBC 12.0* 10.1  HGB 13.9 12.6  HCT 43.2 40.5  MCV 80.0 81.3  PLT 300 271    Basic Metabolic Panel:   Recent Labs Lab 03/31/15 1419 04/02/15 0700  NA 140 137  K 4.1 3.8  CL 103 103  CO2 25 27  GLUCOSE 199* 135*  BUN 22* 31*  CREATININE  1.02* 1.16*  CALCIUM 9.5 9.5    Lipid Panel:     Component Value Date/Time   CHOL 137 04/01/2015 0422   TRIG 174* 04/01/2015 0422   HDL 26* 04/01/2015 0422   CHOLHDL 5.3 04/01/2015 0422   VLDL 35 04/01/2015 0422   LDLCALC 76 04/01/2015 0422   HgbA1c:  Lab Results  Component Value Date   HGBA1C 9.0* 03/25/2015   Urine Drug Screen: No results found for: LABOPIA, COCAINSCRNUR, LABBENZ, AMPHETMU, THCU, LABBARB    IMAGING I have personally reviewed the radiological images below and agree with the radiology interpretations.  Ct Abdomen Pelvis Wo Contrast 03/31/2015  1. No acute findings or explanation for the patient's symptoms. 2. Bladder distention, likely accounting for mild intrarenal collecting system dilatation. No evidence of urinary tract calculus or secondary signs of ureteral obstruction. 3. Hepatic steatosis, colonic diverticulosis and atherosclerosis noted.   Dg Chest 2 View 03/31/2015   Cardiomegaly without acute process.  No interval change.   Ct Head Wo Contrast 03/31/2015  Findings concerning for recent/ possibly acute infarct in the medial posterior right occipital lobe. Prior infarct posterior left occipital lobe. There is atrophy with patchy small vessel disease in the periventricular white matter bilaterally. No hemorrhage or mass effect. Sinusitis with air-fluid level in the right maxillary antrum.    MRI HEAD  04/01/2015  IMPRESSION: 1. Small patchy acute ischemic infarcts involving the  left periatrial white matter and left temporoparietal region. 2. Remote bilateral occipital infarcts, left greater than right, with scattered remote lacunar infarcts as above. 3. Cerebral atrophy with moderate chronic small vessel ischemic disease.  MRA HEAD  04/01/2015  IMPRESSION: 1. No large or proximal arterial branch occlusion. 2. Focal high-grade stenosis within the proximal left M1 segment. 3. Extensive atheromatous throughout the posterior circulation, with focal severe  stenoses within the left V4 segment, proximal right SCA, and mid left P2 segment.  EEG - This EEG is abnormal with mild generalized nonspecific continuous slowing of cerebral activity. No evidence of an epileptic disorder was demonstrated.   PHYSICAL EXAM  Temp:  [97.3 F (36.3 C)-98.1 F (36.7 C)] 97.5 F (36.4 C) (11/25 1001) Pulse Rate:  [55-72] 55 (11/25 1001) Resp:  [18] 18 (11/25 1001) BP: (99-139)/(45-64) 124/50 mmHg (11/25 1001) SpO2:  [93 %-98 %] 96 % (11/25 1001)  General - Well nourished, well developed, calm, in trendelenburg position.  Ophthalmologic - Fundi not visualized due to noncooperation.  Cardiovascular - Regular rhythm and rate.  Mental Status -  Awake, alert, orientated to place and self and people and month and current president, but not to year. Following commands, normal expression, able to repeat, but name 3/4 objects.  Cranial Nerves II - XII - II - Visual field intact OU. III, IV, VI - Extraocular movements intact. V - Facial sensation intact bilaterally. VII - Facial movement intact bilaterally. VIII - Hearing & vestibular intact bilaterally. X - Palate elevates symmetrically. XI - Chin turning & shoulder shrug intact bilaterally. XII - Tongue protrusion intact.  Motor Strength - The patient's strength was normal in all extremities except right UE 4/5 proxima and distal and pronator drift was present.  Bulk was normal and fasciculations were absent.   Motor Tone - Muscle tone was assessed at the neck and appendages and was normal.  Reflexes - The patient's reflexes were 1+ in all extremities and she had no pathological reflexes.  Sensory - Light touch, temperature/pinprick were assessed and were symmetrical.    Coordination - The patient had normal movements in the hands and feet with no ataxia or dysmetria.  Tremor was absent.  Gait and Station - not tested due to orthostatic hypotension.   ASSESSMENT/PLAN [name] is a 80 y.o.  female with history of DM, hypertensilon, asthma, PVD, COPD presenting with agitation and increased confusion following diarrhea x 2 days at home. She did not receive IV t-PA due to delay in arrival.   Altered mental status and confusion, much improved, likely multifactorial with stroke, dehydration, diarrhea, and hypertensive urgency on admission  Mental status much improved overnight  UA negative  CXR negative  afebrile  EEG no seizure like activity  No need for LP at this time  Stop amitriptyline     Stroke:  Small L brain punctate infarcts in setting of old bilateral occipital small infarcts, likely embolic secondary to unknown source.   MRI  Small patchy left parietal white matter and left temporoparietal region infarcts. Remote bilateral occipital infarcts, left greater than right. scattered remote lacunes. Atrophy. chronic small vessel disease.  MRA  Focal L M1 stenosis.  focal severe stenoses L V4 segment, proximal R SCA, and mid L P2 segment.  Carotid Doppler  pending   2D Echo  EF 60-65%. No cardiac source of emboli identified.  LDL 76  HgbA1c 9.0  Lovenox 40 mg sq daily for VTE prophylaxis Diet Heart Room service appropriate?: Yes;  Fluid consistency:: Thin  aspirin 81 mg daily prior to admission, now on aspirin 325 mg daily. Given M1 stenosis, will change to  aspirin 81 mg and clopidogrel 75 mg orally every day x 3 months for secondary stroke prevention. After 3 months, change to plavix alone. Long-term dual antiplatelets are contraindicated due to risk for intracerebral hemorrhage.  Recommend outpatient 30 day cardiac event monitoring to rule out afib.   Patient and husband counseled to be compliant with her antithrombotic medications  Ongoing aggressive stroke risk factor management  Therapy recommendations:  CIR  Disposition:  pending   Hypertensive Urgency / orthostatic hypotension  BP elevated  BP goal gradually normalize in 5-7 days  Orthostatic this  am, put on IV bolus and trendelenburg position  D/c cardizem, avapro  Decreased coreg from 25 to 6.25mg    Close monitoring  Hyperlipidemia  Home meds:  No statin  LDL 76, goal < 70  Added lipitor 10 mg this admission  Continue statin at discharge  Diabetes type II  HgbA1c 9.0, goal < 7.0  Uncontrolled  On levemir  SSI  CBG monitoring  Close follow up with PCP  Other Stroke Risk Factors  Advanced age  Family hx stroke (materal grandfather)  PVD  Other Active Problems  COPD  Hypoxia  Mild dementia at baseline  Patient needs to avoid hypotension and dehydration.  Hospital day # 2  Marvel PlanJindong Chick Cousins, MD PhD Stroke Neurology 04/02/2015 2:09 PM    To contact Stroke Continuity provider, please refer to [email or web address]. After hours, contact General Neurology

## 2015-04-02 NOTE — Progress Notes (Addendum)
Pt came back from CT head stat. No hemorrhage seen. No acute findings. SBP still at 100-109. More than half way on 1L bolus now. Will increase IVF to 13425ml/h. Albumin 12.5g Q6 x 4. Please place TED hose. Close BP monitoring. Pt right UE still 3-/5. RLE 5/5. No aphsia.   Pt also have severe UTI and put on rocephine.   Marvel PlanJindong Venida Tsukamoto, MD PhD Stroke Neurology 04/02/2015 5:19 PM

## 2015-04-02 NOTE — Evaluation (Signed)
Physical Therapy Evaluation Patient Details Name: Casey Harrell MRN: [id number] DOB: 1934/10/25 Today's Date: 04/02/2015   History of Present Illness  80 yo female with onset of L temporoparietal stroke and hepatic steatosis, diverticulosis and atherosclerosis  Clinical Impression  Pt was able to assist to chair and has difficulty with more than a few stesp with very wide base gait.  Will continue to work on gait with AD's to see what she is able to tolerate, and to see what control she can regain of standing and mobility.    Follow Up Recommendations CIR    Equipment Recommendations  None recommended by PT    Recommendations for Other Services       Precautions / Restrictions Precautions Precautions: Fall (telemetry) Restrictions Weight Bearing Restrictions: No      Mobility  Bed Mobility Overal bed mobility: Needs Assistance Bed Mobility: Supine to Sit     Supine to sit: Mod assist     General bed mobility comments: mainly needs assist under trunk and to finish scooting to edge of bed  Transfers Overall transfer level: Needs assistance Equipment used: 1 person hand held assist Transfers: Sit to/from Stand;Stand Pivot Transfers Sit to Stand: Min assist Stand pivot transfers: Min assist       General transfer comment: holding RUE and assisting transition to chair  Ambulation/Gait             General Gait Details: unable to do more than transfer, did pass out this am  Stairs            Wheelchair Mobility    Modified Rankin (Stroke Patients Only)       Balance Overall balance assessment: Needs assistance Sitting-balance support: Feet supported Sitting balance-Leahy Scale: Fair   Postural control: Posterior lean Standing balance support: Single extremity supported Standing balance-Leahy Scale: Poor                               Pertinent Vitals/Pain Pain Assessment: No/denies pain    Home Living Family/patient expects to  be discharged to:: Private residence Living Arrangements: Spouse/significant other Available Help at Discharge: Family;Available 24 hours/day Type of Home: House       Home Layout: One level Home Equipment: Cane - single point;Walker - 2 wheels;Shower seat      Prior Function Level of Independence: Independent with assistive device(s)         Comments: husband assists her     Hand Dominance        Extremity/Trunk Assessment   Upper Extremity Assessment: Generalized weakness           Lower Extremity Assessment: Generalized weakness      Cervical / Trunk Assessment: Kyphotic  Communication   Communication: No difficulties  Cognition Arousal/Alertness: Awake/alert Behavior During Therapy: WFL for tasks assessed/performed Overall Cognitive Status: Within Functional Limits for tasks assessed                      General Comments General comments (skin integrity, edema, etc.): Pt has family in attendance for her trip to chair from bed with gentle mobility done, has low endurance and limited tolerance for standing with unsteady standing balance control    Exercises        Assessment/Plan    PT Assessment Patient needs continued PT services  PT Diagnosis Generalized weakness   PT Problem List Decreased strength;Decreased range of motion;Decreased activity tolerance;Decreased balance;Decreased mobility;Decreased  coordination;Decreased safety awareness;Decreased knowledge of precautions;Cardiopulmonary status limiting activity  PT Treatment Interventions DME instruction;Gait training;Stair training;Functional mobility training;Therapeutic activities;Therapeutic exercise;Balance training;Neuromuscular re-education;Patient/family education   PT Goals (Current goals can be found in the Care Plan section) Acute Rehab PT Goals Patient Stated Goal: none stated PT Goal Formulation: With patient/family Time For Goal Achievement: 04/16/15 Potential to Achieve  Goals: Good    Frequency Min 2X/week   Barriers to discharge Inaccessible home environment (2 person assist to get up to walk)      Co-evaluation               End of Session Equipment Utilized During Treatment: Oxygen Activity Tolerance: Patient tolerated treatment well;Patient limited by fatigue Patient left: in chair;with call bell/phone within reach;with chair alarm set;with family/visitor present Nurse Communication: Mobility status         Time: 1117-1140 PT Time Calculation (min) (ACUTE ONLY): 23 min   Charges:   PT Evaluation $Initial PT Evaluation Tier I: 1 Procedure PT Treatments $Therapeutic Activity: 8-22 mins   PT G Codes:        Ivar Drape 04/07/15, 12:56 PM   Samul Dada, PT MS Acute Rehab Dept. Number: [id number] and [facility] [phone]

## 2015-04-02 NOTE — Progress Notes (Signed)
Patient c/o chest pain but verbalized that it is normal for her. Patient then fainted as she attempted to sit u. Per family, patient has hx of vertigo and takes meclizine as needed. VSS, 1 L NS bolus, EKG perform. Blood sugar stable. MD at bedside at this time. Will obtain orthostatic VS once patient back to baseline. Family at bedside. Will continue to monitor.   Sim BoastHavy, RN

## 2015-04-02 NOTE — Evaluation (Signed)
SLP Cancellation Note  Patient Details Name: Casey Harrell MRN: [id number] DOB: [birth year]   Cancelled treatment:       Reason Eval/Treat Not Completed: Other (comment) (pt working with PT at this time and meal tray just arrived, will reattempt SLE at later time)   Donavan Burnetamara Aylanie Cubillos, MS [facility] SLP [phone]

## 2015-04-02 NOTE — Progress Notes (Signed)
EEG completed; results pending.    

## 2015-04-02 NOTE — Procedures (Signed)
ELECTROENCEPHALOGRAM REPORT  Patient: Casey Harrell       Room #: [id number] EEG No. ID:  Age: 80 y.o.        Sex: female Referring Physician: David StallFELIZ ORTIZ, A Report Date:  04/02/2015        Interpreting Physician: Aline BrochureSTEWART,Ephrem Carrick R  History: Casey MedianKay F Suit is an 80 y.o. female admitted with fever, mild leukocytosis, confusion and agitation. Patient has a history of dementia. MRI showed small patchy acute ischemic infarctions involving left periatrial white matter and left temporoparietal region. Old lacunar infarcts as well as bilateral occipital infarcts some noted.  Indications for study:  Assess severity of encephalopathy; rule out focal seizure activity.  Technique: This is an 18 channel routine scalp EEG performed at the bedside with bipolar and monopolar montages arranged in accordance to the international 10/20 system of electrode placement.   Description: EEG recording was performed during wakefulness and during sleep. Her dominant activity during wakefulness consisted of low amplitude use of regular delta activity with superimposed 6-8 Hz rhythmic activity which was most prominent posteriorly. Photic stimulation was not performed. During sleep though was slowing of background activity diffusely and symmetrically. Normal sleep spindles and arousal responses were recorded during stage II of sleep. No epileptiform discharges occurred during wakefulness nor during sleep.  Interpretation: This EEG is abnormal with mild generalized nonspecific continuous slowing of cerebral activity. No evidence of an epileptic disorder was demonstrated.   Venetia MaxonR Javier Gell M.D. [facility] [phone]

## 2015-04-02 NOTE — Progress Notes (Signed)
[facility] PROGRESS NOTE    Progress Note   TASHARRA NODINE BJY:[id number] DOB: 1934/09/04 DOA: 03/31/2015 PCP: [name], MD   Brief Narrative:   Casey Harrell is an 80 y.o. female past medical history of diabetes hypertension and COPD is brought in by the family because of confusion as soon as she woke up in the morning which progressively got worse throughout the day with agitation.  Assessment/Plan:   Acute ischemic stroke [facility]): A1c 9.0, fasting lipid panel  HDL < 40, LDL 76 cont statins. MRI, MRA of the brain without contrast showed a left paratrial infarct and remote occipital infarcts. PT consult, OT consult, pending Echocardiogram no clots, EF 55% grade 1 diastolic HF and Carotid dopplers pending She will need aspirin and Plavix for 3 months for secondary prevention after LP is completed. Cardiac Monitoring with no events. Neurochecks q4h  Keep MAP 70 LP to be done on 11.25.2016, EEG results are pending. She will need a loop recorder as an outpatient to rule out atrial fibrillation.  Acute encephalopathy: Unclear etiology of why she is confused, it is now improved. She doesn't have a past medical history of dementia. She has a petechial rash on her left upper extremity, she is not thrombocytopenic, has a mild leukocytosis lactic acid 2 and a mild fever on admission.   Fever in adult: No fever since admission. Her UA shows many bacteria pseudo-5 white blood cells, chest x-ray does not show any infiltrates. CT scan of the abdomen pelvis does not show any findings to explain the infection. She has a rash in upper extremity,  Questionably due to stroke. She has remained afebrile off antibiotics.  Type 2 diabetes mellitus, uncontrolled (HCC): Decrease earlier to 30 units twice today continue sliding-scale insulin poorly controlled diabetes mellitus with hemoglobin A1c of 9. We'll try to do further titration of her insulin as an inpatient.  Sleep apnea: She  cannot tolerate C-pap   Acute respiratory failure with hypoxia (HCC) - Unclear etiology now resolved. Question if due to Haldol as it was a single episode, she is on 1 L of oxygen at night, chest x-ray does not show any infiltrate. She did had an episode of vomiting, and she has mild elevation in white count question aspiration. She has remained afebrile since then and continue to hold her antibiotic coverage.  Hypertensive urgency Blood pressure has been slowly improving on oral medications.  Depression: Continue current home regimen.   DVT Prophylaxis - Lovenox ordered.  Family Communication: husband Disposition Plan: Home in 2-3 days Code Status:     Code Status Orders        Start     Ordered   03/31/15 2048  Full code   Continuous     03/31/15 2047        IV Access:    Peripheral IV   Procedures and diagnostic studies:   Ct Abdomen Pelvis Wo Contrast  03/31/2015  CLINICAL DATA:  Malaise with nausea and vomiting today. Disorientation. Initial encounter. EXAM: CT ABDOMEN AND PELVIS WITHOUT CONTRAST TECHNIQUE: Multidetector CT imaging of the abdomen and pelvis was performed following the standard protocol without IV contrast. COMPARISON:  CT 04/09/2014 and 08/23/2009. FINDINGS: Lower chest: Mild atelectasis and vascular crowding at both lung bases. There is no confluent airspace opacity or significant pleural effusion. Atherosclerosis and a small hiatal hernia noted. Hepatobiliary: Diffusely decreased hepatic density again noted consistent with steatosis. No focal abnormalities identified on noncontrast imaging. No evidence of gallstones, gallbladder wall  thickening or biliary dilatation. Pancreas: Fatty replacement and atrophy. No acute findings or surrounding inflammation. Spleen: Normal in size without focal abnormality. Adrenals/Urinary Tract: Both adrenal glands appear normal. There is no evidence of urinary tract calculus or perinephric soft tissue stranding. There  is mild collecting system dilatation bilaterally, probably related to distension of the urinary bladder. No focal bladder abnormality seen. Retroperitoneal calcification on the right (image 50) appears unchanged, within the gonadal vein. Stomach/Bowel: No evidence of bowel wall thickening, distention or surrounding inflammatory change. Mild diverticular changes throughout the colon. Vascular/Lymphatic: There are no enlarged abdominal or pelvic lymph nodes. Moderate aortoiliac atherosclerosis noted. Reproductive: Hysterectomy.  No evidence of adnexal mass. Other: Possible injection changes within the subcutaneous fat of the anterior abdominal wall. Musculoskeletal: No acute or significant osseous findings. There are diffuse degenerative changes throughout the spine. IMPRESSION: 1. No acute findings or explanation for the patient's symptoms. 2. Bladder distention, likely accounting for mild intrarenal collecting system dilatation. No evidence of urinary tract calculus or secondary signs of ureteral obstruction. 3. Hepatic steatosis, colonic diverticulosis and atherosclerosis noted. Electronically Signed   By: Carey Bullocks M.D.   On: 03/31/2015 17:43   Dg Chest 2 View  03/31/2015  CLINICAL DATA:  Shortness of breath, altered mental status EXAM: CHEST  2 VIEW COMPARISON:  11/05/2014 FINDINGS: Stable mild cardiomegaly. No focal pneumonia, collapse or consolidation. Negative for edema, effusion, or pneumothorax. Trachea midline. Monitor leads overlie the chest. No acute osseous finding. IMPRESSION: Cardiomegaly without acute process.  No interval change. Electronically Signed   By: Judie Petit.  Shick M.D.   On: 03/31/2015 15:53   Ct Head Wo Contrast  03/31/2015  CLINICAL DATA:  Disoriented with nausea and vomiting. EXAM: CT HEAD WITHOUT CONTRAST TECHNIQUE: Contiguous axial images were obtained from the base of the skull through the vertex without intravenous contrast. COMPARISON:  August 22, 2009 FINDINGS: There is mild  diffuse atrophy. There is no intracranial mass, hemorrhage, extra-axial fluid collection, or midline shift. There is evidence of a prior infarct in the posterior inferior left occipital lobe. In comparison with the prior study, there is now decreased attenuation in the posterior medial right occipital lobe, concerning for recent infarct. Elsewhere, there is patchy small vessel disease throughout the centra semiovale bilaterally. The bony calvarium appears intact. The mastoid air cells are clear. There is an air-fluid level in the right maxillary antrum. There is mucosal thickening in the left maxillary antrum. No intraorbital lesions are appreciable. IMPRESSION: Findings concerning for recent/ possibly acute infarct in the medial posterior right occipital lobe. Prior infarct posterior left occipital lobe. There is atrophy with patchy small vessel disease in the periventricular white matter bilaterally. No hemorrhage or mass effect. Sinusitis with air-fluid level in the right maxillary antrum. Electronically Signed   By: Bretta Bang III M.D.   On: 03/31/2015 16:44   Mr Brain Wo Contrast  04/01/2015  CLINICAL DATA:  Initial evaluation for acute altered mental status, vertigo. EXAM: MRI HEAD WITHOUT CONTRAST MRA HEAD WITHOUT CONTRAST TECHNIQUE: Multiplanar, multiecho pulse sequences of the brain and surrounding structures were obtained without intravenous contrast. Angiographic images of the head were obtained using MRA technique without contrast. COMPARISON:  Prior CT from 03/31/2015. FINDINGS: MRI HEAD FINDINGS Study is severely limited by motion artifact. Diffuse prominence of the CSF containing spaces is compatible with generalized cerebral atrophy. Patchy T2/FLAIR hyperintensity within the periventricular and deep white matter of both cerebral hemispheres is present, consistent with moderate chronic small vessel ischemic disease. This appears  grossly similar relative to previous MRI from 2011.  Superimposed lacunar infarct within the deep white matter of the anterior right centrum semi ovale. Additional scattered remote lacunar infarcts within the bilateral thalami and basal ganglia. Encephalomalacia present within the bilateral occipital lobes, which may be related to remote ischemia. 9 mm acute ischemic infarct within the left periatrial white matter (series 6, image 23). Tiny infarct within the overlying cortical gray matter of the posterior left temporal occipital region. Few additional tiny ischemic infarcts within the deep white matter of the left parietal lobe (series 6, image 30, 28). No associated hemorrhage or mass effect. No other infarct. Few small chronic micro hemorrhages noted within the supratentorial and infratentorial brain, most prominent within the left paramedian pons (series 15, image 7), likely related to chronic underlying hypertension. No mass lesion, midline shift, or mass effect. No hydrocephalus. No extra-axial fluid collection. Craniocervical junction normal.  Pituitary gland normal. No acute abnormality about the orbits. Sequela prior bilateral lens extraction noted. Air-fluid level within the right maxillary sinus. Scattered opacity within the right sphenoid sinus. Small right mastoid effusion. Bone marrow signal intensity within normal limits. Scalp soft tissues demonstrate no acute abnormality. MRA HEAD FINDINGS ANTERIOR CIRCULATION: Visualized distal cervical segments of the internal carotid arteries are patent with antegrade flow. Petrous, cavernous, and supraclinoid segments are patent without flow-limiting stenosis. ICA termini patent. Left A1 segment well opacified. Right A1 segment hypoplastic. Anterior communicating artery and anterior cerebral arteries well opacified. Right M1 segment patent without stenosis or occlusion. Right MCA bifurcation normal. Distal right MCA branches opacified. Focal severe high-grade stenosis at the proximal left M1 segment. M1 segment is  opacified distally. MCA bifurcation normal on the left. Left MCA branches are fairly symmetric with the right and are well opacified. POSTERIOR CIRCULATION: Right vertebral artery dominant. Right vertebral artery demonstrates multi focal atheromatous irregularity without flow-limiting stenosis. There is a focal severe stenosis within the diminutive left V4 segment. Right PICA is patent. Left posterior inferior cerebral artery not visualized. Basilar artery widely patent. Superior cerebellar arteries opacified bilaterally. Short-segment severe stenosis at the proximal right superior cerebellar artery. Fetal origin of the right PCA with a small right posterior communicating artery. Atheromatous irregularity throughout the right PCA. Atheromatous irregularity with multifocal stenoses throughout the left PCA, with a fairly severe stenosis within the mid left P2 segment. No aneurysm. IMPRESSION: MRI HEAD IMPRESSION: 1. Small patchy acute ischemic infarcts involving the left periatrial white matter and left temporoparietal region. 2. Remote bilateral occipital infarcts, left greater than right, with scattered remote lacunar infarcts as above. 3. Cerebral atrophy with moderate chronic small vessel ischemic disease. MRA HEAD IMPRESSION: 1. No large or proximal arterial branch occlusion. 2. Focal high-grade stenosis within the proximal left M1 segment. 3. Extensive atheromatous throughout the posterior circulation, with focal severe stenoses within the left V4 segment, proximal right SCA, and mid left P2 segment. Electronically Signed   By: Rise Mu M.D.   On: 04/01/2015 06:34   Mr Maxine Glenn Head/brain Wo Cm  04/01/2015  CLINICAL DATA:  Initial evaluation for acute altered mental status, vertigo. EXAM: MRI HEAD WITHOUT CONTRAST MRA HEAD WITHOUT CONTRAST TECHNIQUE: Multiplanar, multiecho pulse sequences of the brain and surrounding structures were obtained without intravenous contrast. Angiographic images of the  head were obtained using MRA technique without contrast. COMPARISON:  Prior CT from 03/31/2015. FINDINGS: MRI HEAD FINDINGS Study is severely limited by motion artifact. Diffuse prominence of the CSF containing spaces is compatible with generalized cerebral atrophy. Patchy  T2/FLAIR hyperintensity within the periventricular and deep white matter of both cerebral hemispheres is present, consistent with moderate chronic small vessel ischemic disease. This appears grossly similar relative to previous MRI from 2011. Superimposed lacunar infarct within the deep white matter of the anterior right centrum semi ovale. Additional scattered remote lacunar infarcts within the bilateral thalami and basal ganglia. Encephalomalacia present within the bilateral occipital lobes, which may be related to remote ischemia. 9 mm acute ischemic infarct within the left periatrial white matter (series 6, image 23). Tiny infarct within the overlying cortical gray matter of the posterior left temporal occipital region. Few additional tiny ischemic infarcts within the deep white matter of the left parietal lobe (series 6, image 30, 28). No associated hemorrhage or mass effect. No other infarct. Few small chronic micro hemorrhages noted within the supratentorial and infratentorial brain, most prominent within the left paramedian pons (series 15, image 7), likely related to chronic underlying hypertension. No mass lesion, midline shift, or mass effect. No hydrocephalus. No extra-axial fluid collection. Craniocervical junction normal.  Pituitary gland normal. No acute abnormality about the orbits. Sequela prior bilateral lens extraction noted. Air-fluid level within the right maxillary sinus. Scattered opacity within the right sphenoid sinus. Small right mastoid effusion. Bone marrow signal intensity within normal limits. Scalp soft tissues demonstrate no acute abnormality. MRA HEAD FINDINGS ANTERIOR CIRCULATION: Visualized distal cervical  segments of the internal carotid arteries are patent with antegrade flow. Petrous, cavernous, and supraclinoid segments are patent without flow-limiting stenosis. ICA termini patent. Left A1 segment well opacified. Right A1 segment hypoplastic. Anterior communicating artery and anterior cerebral arteries well opacified. Right M1 segment patent without stenosis or occlusion. Right MCA bifurcation normal. Distal right MCA branches opacified. Focal severe high-grade stenosis at the proximal left M1 segment. M1 segment is opacified distally. MCA bifurcation normal on the left. Left MCA branches are fairly symmetric with the right and are well opacified. POSTERIOR CIRCULATION: Right vertebral artery dominant. Right vertebral artery demonstrates multi focal atheromatous irregularity without flow-limiting stenosis. There is a focal severe stenosis within the diminutive left V4 segment. Right PICA is patent. Left posterior inferior cerebral artery not visualized. Basilar artery widely patent. Superior cerebellar arteries opacified bilaterally. Short-segment severe stenosis at the proximal right superior cerebellar artery. Fetal origin of the right PCA with a small right posterior communicating artery. Atheromatous irregularity throughout the right PCA. Atheromatous irregularity with multifocal stenoses throughout the left PCA, with a fairly severe stenosis within the mid left P2 segment. No aneurysm. IMPRESSION: MRI HEAD IMPRESSION: 1. Small patchy acute ischemic infarcts involving the left periatrial white matter and left temporoparietal region. 2. Remote bilateral occipital infarcts, left greater than right, with scattered remote lacunar infarcts as above. 3. Cerebral atrophy with moderate chronic small vessel ischemic disease. MRA HEAD IMPRESSION: 1. No large or proximal arterial branch occlusion. 2. Focal high-grade stenosis within the proximal left M1 segment. 3. Extensive atheromatous throughout the posterior  circulation, with focal severe stenoses within the left V4 segment, proximal right SCA, and mid left P2 segment. Electronically Signed   By: Rise Mu M.D.   On: 04/01/2015 06:34     Medical Consultants:    None.  Anti-Infectives:   Anti-infectives    None      Subjective:    [name] she has no new complaints.   Objective:    Filed Vitals:   04/01/15 2100 04/02/15 0207 04/02/15 0500 04/02/15 0530  BP: 119/50 139/64  138/61  Pulse: 70 64  56  Temp: 98.1 F (36.7 C) 97.5 F (36.4 C) 97.9 F (36.6 C) 97.9 F (36.6 C)  TempSrc: Oral Oral Oral Oral  Resp: 18 18  18   Height:      Weight:      SpO2: 95% 98%  96%    Intake/Output Summary (Last 24 hours) at 04/02/15 0924 Last data filed at 04/01/15 1700  Gross per 24 hour  Intake    480 ml  Output      0 ml  Net    480 ml   Filed Weights   03/31/15 2041  Weight: 65.4 kg (144 lb 2.9 oz)    Exam: Gen:  NAD Cardiovascular:  RRR, No M/R/G Chest and lungs:   CTAB Abdomen:  Abdomen soft, NT/ND, + BS Extremities:  No C/E/C Neuro: She is awake alert and oriented 3 with a coherent following language slow to response   Data Reviewed:    Labs: Basic Metabolic Panel:  Recent Labs Lab 03/31/15 1419 04/02/15 0700  NA 140 137  K 4.1 3.8  CL 103 103  CO2 25 27  GLUCOSE 199* 135*  BUN 22* 31*  CREATININE 1.02* 1.16*  CALCIUM 9.5 9.5   GFR Estimated Creatinine Clearance: 36 mL/min (by C-G formula based on Cr of 1.16). Liver Function Tests:  Recent Labs Lab 03/31/15 1419  AST 34  ALT 31  ALKPHOS 84  BILITOT 0.6  PROT 8.3*  ALBUMIN 4.5    Recent Labs Lab 03/31/15 1419  LIPASE 19    Recent Labs Lab 04/02/15 0700  AMMONIA 20   Coagulation profile No results for input(s): INR, PROTIME in the last 168 hours.  CBC:  Recent Labs Lab 03/31/15 1419 04/02/15 0700  WBC 12.0* 10.1  HGB 13.9 12.6  HCT 43.2 40.5  MCV 80.0 81.3  PLT 300 271   Cardiac Enzymes: No results for  input(s): CKTOTAL, CKMB, CKMBINDEX, TROPONINI in the last 168 hours. BNP (last 3 results) No results for input(s): PROBNP in the last 8760 hours. CBG:  Recent Labs Lab 04/01/15 0632 04/01/15 1145 04/01/15 1634 04/01/15 2105 04/02/15 0637  GLUCAP 180* 229* 226* 191* 121*   D-Dimer: No results for input(s): DDIMER in the last 72 hours. Hgb A1c: No results for input(s): HGBA1C in the last 72 hours. Lipid Profile:  Recent Labs  04/01/15 0422  CHOL 137  HDL 26*  LDLCALC 76  TRIG [id number]*  CHOLHDL 5.3   Thyroid function studies: No results for input(s): TSH, T4TOTAL, T3FREE, THYROIDAB in the last 72 hours.  Invalid input(s): FREET3 Anemia work up: No results for input(s): VITAMINB12, FOLATE, FERRITIN, TIBC, IRON, RETICCTPCT in the last 72 hours. Sepsis Labs:  Recent Labs Lab 03/31/15 1419 03/31/15 1542 04/02/15 0700  WBC 12.0*  --  10.1  LATICACIDVEN  --  2.04*  --    Microbiology Recent Results (from the past 240 hour(s))  Urine culture     Status: None   Collection Time: 03/31/15  3:27 PM  Result Value Ref Range Status   Specimen Description URINE, CATHETERIZED  Final   Special Requests Normal  Final   Culture   Final    >=100,000 COLONIES/mL KLEBSIELLA PNEUMONIAE [facility]    Report Status 04/02/2015 FINAL  Final   Organism ID, Bacteria KLEBSIELLA PNEUMONIAE  Final      Susceptibility   Klebsiella pneumoniae - MIC*    AMPICILLIN 16 RESISTANT Resistant     CEFAZOLIN <=4 SENSITIVE Sensitive  CEFTRIAXONE <=1 SENSITIVE Sensitive     CIPROFLOXACIN <=0.25 SENSITIVE Sensitive     GENTAMICIN <=1 SENSITIVE Sensitive     IMIPENEM <=0.25 SENSITIVE Sensitive     NITROFURANTOIN <=16 SENSITIVE Sensitive     TRIMETH/SULFA <=20 SENSITIVE Sensitive     AMPICILLIN/SULBACTAM 4 SENSITIVE Sensitive     PIP/TAZO <=4 SENSITIVE Sensitive     * >=100,000 COLONIES/mL KLEBSIELLA PNEUMONIAE     Medications:   . aspirin EC  81 mg Oral Daily  .  atorvastatin  10 mg Oral q1800  . carvedilol  25 mg Oral BID WC  . diltiazem  120 mg Oral Daily  . DULoxetine  60 mg Oral Daily  . enoxaparin (LOVENOX) injection  40 mg Subcutaneous Q24H  . hydrocortisone  25 mg Rectal BID  . insulin aspart  0-5 Units Subcutaneous QHS  . insulin aspart  0-9 Units Subcutaneous TID WC  . insulin detemir  30 Units Subcutaneous BID  . irbesartan  75 mg Oral Daily  . levothyroxine  50 mcg Oral QAC breakfast  . pantoprazole  40 mg Oral Daily  . pregabalin  150 mg Oral Daily   Continuous Infusions: . sodium chloride 75 mL/hr at 04/01/15 1253    Time spent: 25 min   LOS: 2 days   Marinda Elk  [facility] Pager [phone]  *Please refer to [email or web address], password [email or web address] to get updated schedule on who will round on this patient, as hospitalists switch teams weekly. If 7PM-7AM, please contact night-coverage at [email or web address], password [email or web address] for any overnight needs.  04/02/2015, 9:24 AM

## 2015-04-02 NOTE — Progress Notes (Signed)
Progress note:  Called by RN that pt had acute neuro change at 3:30pm on nursing check. Last seen at baseline 12pm. She appeared to be aphasic, with right arm flaccid and right leg significant drift. She was sitting in chair at that time and BP was 114/51.   On my arrival, she was still in chair, moved her to bed, lying flat, re check BP 91/75. Gave IL bolus and put her on trendelenburg position. Pt had no aphasia at that time, right leg 5/5 and right arm 3-/5. Clinic improved from previous but worsened than in the am.   Of note, she has left M1 high grade stenosis on MRA. This morning, she had orthostatic hypotension and syncope, received IV bolus. Her avapro and cardizem were discontinued and coreg was decreased from 25 to 6.25mg  this morning. She had right UE drift, likely due to hypoperfusion event.  Now, it seems that she had further hypoperfusion event due to continued orthostatic hypotension.  Will proceed with stat CT and routine MRI brain. However, she is BP dependent. Put her on bed rest and trendelenburg position. D/c coreg. Still on IV bolus. Continue IVF @75 . Vital Q2h x 12 hours. Close BP monitoring. BP goal 130-160.   Marvel PlanJindong Advik Weatherspoon, MD PhD Stroke Neurology 04/02/2015 4:46 PM

## 2015-04-02 NOTE — Evaluation (Signed)
Occupational Therapy Evaluation Patient Details Name: Casey Harrell MRN: [id number] DOB: Sep 08, 1934 Today's Date: 04/02/2015    History of Present Illness 80 yo female with onset of L temporoparietal stroke and hepatic steatosis, diverticulosis and atherosclerosis   Clinical Impression   Patient presenting with decreased ADL and functional mobility independence secondary to above. Patient independent to mod I PTA. Patient currently functioning at an overall mod to total assist level. Patient will benefit from acute OT to increase overall independence in the areas of ADLs, functional mobility, and overall safety in order to safely discharge to venue listed below.     Follow Up Recommendations  CIR;Supervision/Assistance - 24 hour    Equipment Recommendations  Other (comment) (TBD next venue of care)    Recommendations for Other Services  None at this time    Precautions / Restrictions Precautions Precautions: Fall Restrictions Weight Bearing Restrictions: No    Mobility Bed Mobility Overal bed mobility: Needs Assistance Bed Mobility: Supine to Sit     Supine to sit: Mod assist     General bed mobility comments: mainly needs assist under trunk and to finish scooting to edge of bed, cues for management of RUE  Transfers Overall transfer level: Needs assistance Equipment used: 1 person hand held assist Transfers: Sit to/from Stand;Stand Pivot Transfers Sit to Stand: Mod assist Stand pivot transfers: Mod assist General transfer comment: Mod assist to assist with lift/lower from recliner. Cues for safety.     Balance Overall balance assessment: Needs assistance Sitting-balance support: No upper extremity supported;Feet supported Sitting balance-Leahy Scale: Fair   Postural control: Posterior lean Standing balance support: No upper extremity supported Standing balance-Leahy Scale: Poor    ADL Overall ADL's : Needs assistance/impaired Eating/Feeding: Moderate  assistance;Sitting   Grooming: Moderate assistance;Sitting   Upper Body Bathing: Maximal assistance;Sitting   Lower Body Bathing: Maximal assistance;Sit to/from stand   Upper Body Dressing : Maximal assistance;Sitting   Lower Body Dressing: Maximal assistance;Sit to/from stand   Toilet Transfer: Moderate assistance;Cueing for safety;Stand-pivot;BSC   Toileting- Clothing Manipulation and Hygiene: Total assistance Functional mobility during ADLs: Moderate assistance General ADL Comments: Pt lethargic during session. Pt requires mod to total assist due to weakness throughout RUE and decreased overall cognition.      Vision Additional Comments: unable to fully assess secondary to patient's level of alertness and patient unable to accurately answer questions.          Pertinent Vitals/Pain Pain Assessment: No/denies pain     Hand Dominance Right   Extremity/Trunk Assessment Upper Extremity Assessment Upper Extremity Assessment: RUE deficits/detail RUE Deficits / Details: decreased strength throughout RUE, strength grossly 2/5 RUE Sensation: decreased light touch;decreased proprioception RUE Coordination: decreased fine motor;decreased gross motor   Lower Extremity Assessment Lower Extremity Assessment: Defer to PT evaluation   Cervical / Trunk Assessment Cervical / Trunk Assessment: Kyphotic   Communication Communication Communication: No difficulties   Cognition Arousal/Alertness: Awake/alert Behavior During Therapy: WFL for tasks assessed/performed Overall Cognitive Status: Impaired/Different from baseline Area of Impairment: Attention;Memory;Following commands;Safety/judgement;Awareness;Problem solving   Current Attention Level: Selective Memory: Decreased short-term memory Following Commands: Follows one step commands with increased time;Follows multi-step commands inconsistently Safety/Judgement: Decreased awareness of safety;Decreased awareness of  deficits Awareness: Intellectual Problem Solving: Slow processing;Decreased initiation;Difficulty sequencing;Requires verbal cues;Requires tactile cues General Comments: During interview process with home living enviornment and equipment pt incorrectly asnwering questions, husband present to correctly answer questions              Home Living Family/patient expects  to be discharged to:: Private residence Living Arrangements: Spouse/significant other Available Help at Discharge: Family;Available 24 hours/day Type of Home: House Home Access: Stairs to enter [employer] of Steps: 3   Home Layout: One level     Bathroom Shower/Tub: Walk-in shower;Door   [employer] Toilet: Standard Bathroom Accessibility: Yes   Home Equipment: Cane - single point;Walker - 2 wheels;Shower seat - built in   Additional Comments: husband works during the day      Prior Functioning/Environment Level of Independence: Independent with assistive device(s)        Comments: husband assists her    OT Diagnosis: Generalized weakness;Hemiplegia dominant side;Cognitive deficits   OT Problem List: Decreased strength;Decreased activity tolerance;Impaired balance (sitting and/or standing);Impaired vision/perception;Decreased safety awareness;Decreased knowledge of use of DME or AE;Decreased knowledge of precautions;Impaired sensation;Impaired tone;Impaired UE functional use   OT Treatment/Interventions: Self-care/ADL training;Neuromuscular education;Therapeutic exercise;Energy conservation;DME and/or AE instruction;Therapeutic activities;Splinting;Patient/family education;Balance training    OT Goals(Current goals can be found in the care plan section) Acute Rehab OT Goals Patient Stated Goal: none stated OT Goal Formulation: With patient/family Time For Goal Achievement: 04/16/15 Potential to Achieve Goals: Good ADL Goals Pt Will Perform Grooming: with min assist;sitting Pt Will Perform Lower  Body Bathing: with min assist;sit to/from stand Pt Will Perform Lower Body Dressing: with min assist;sit to/from stand Pt Will Transfer to Toilet: bedside commode;with min assist;ambulating Pt/caregiver will Perform Home Exercise Program: Increased ROM;Increased strength;Right Upper extremity;With Supervision;With written HEP provided (using equipment prn) Additional ADL Goal #1: Pt will perform sit to/from stand with supervision in prep for ADL  OT Frequency: Min 2X/week   Barriers to D/C: None known at this time   End of Session Equipment Utilized During Treatment: Gait belt;Oxygen  Activity Tolerance: Patient limited by lethargy Patient left: in chair;with call bell/phone within reach;with chair alarm set;with family/visitor present;Other (comment) (SLP present)   Time: 2841-3244 OT Time Calculation (min): 18 min Charges:  OT General Charges $OT Visit: 1 Procedure OT Evaluation $Initial OT Evaluation Tier I: 1 Procedure  Jaleea Alesi , MS, OTR/L, CLT Pager: [phone]  04/02/2015, 3:35 PM

## 2015-04-02 NOTE — Care Management Note (Signed)
Case Management Note  Patient Details  Name: Casey Harrell MRN: [id number] Date of Birth: [birth year]  Subjective/Objective:                    Action/Plan: Patient admitted with CVA. Patient is from home with her husband. Awaiting further testing and PT/OT recommendations. CM will continue to follow for discharge needs.   Expected Discharge Date:   (unknown)               Expected Discharge Plan:     In-House Referral:     Discharge planning Services     Post Acute Care Choice:    Choice offered to:     DME Arranged:    DME Agency:     HH Arranged:    HH Agency:     Status of Service:  In process, will continue to follow  Medicare Important Message Given:    Date Medicare IM Given:    Medicare IM give by:    Date Additional Medicare IM Given:    Additional Medicare Important Message give by:     If discussed at Long Length of Stay Meetings, dates discussed:    Additional Comments:  Kermit BaloKelli F Teniola Tseng, RN 04/02/2015, 2:09 PM

## 2015-04-02 NOTE — Evaluation (Signed)
Speech Language Pathology Evaluation Patient Details Name: Casey Harrell MRN: [id number] DOB: 1934/08/14 Today's Date: 04/02/2015 Time: [id number] SLP Time Calculation (min) (ACUTE ONLY): 17 min  Problem List:  Patient Active Problem List   Diagnosis Date Noted  . Orthostatic hypotension   . Hypertensive urgency 04/01/2015  . Fever in adult 04/01/2015  . Acute encephalopathy 04/01/2015  . Confusion   . CVA (cerebral infarction)   . Posterior circulation stroke (HCC)   . Acute ischemic stroke (HCC) 03/31/2015  . Stroke (HCC) 03/31/2015  . CKD (chronic kidney disease) stage 3, GFR 30-59 ml/min 04/17/2014  . Gastroenteritis, non-infectious 04/11/2014  . Nausea vomiting and diarrhea 04/10/2014  . Hypokalemia 04/10/2014  . Dehydration 04/09/2014  . Bronchitis with airway obstruction (HCC) 02/03/2013  . Leukocytosis 02/03/2013  . Fever 02/03/2013  . Acute respiratory failure with hypoxia (HCC) 02/03/2013  . Depression 02/03/2013  . Hyponatremia 02/03/2013  . Obesity (BMI 30-39.9) 01/10/2013  . PVD (peripheral vascular disease)- S/P RCE 06/18/09 12/13/2012  . Sleep apnea- C-pap intol 12/13/2012  . Low risk cardiac nuclear stress test- 2009 and Feb 2011 12/13/2012  . Mild persistent asthma 11/29/2011  . Chronic cough 06/05/2011  . Diabetic neuropathy (HCC) 09/02/2009  . Dyslipidemia 07/13/2009  . Hypothyroidism 11/12/2008  . Type 2 diabetes mellitus, uncontrolled (HCC) 07/15/2008  . Essential hypertension 07/15/2008  . GERD 07/15/2008  . DIVERTICULOSIS, COLON 07/15/2008   Past Medical History:  Past Medical History  Diagnosis Date  . DIABETES-TYPE 2 07/15/2008  . DIABETIC PERIPHERAL NEUROPATHY 09/02/2009  . HYPERTENSION 07/15/2008  . ASTHMA 07/15/2008  . GERD 07/15/2008  . PVD (peripheral vascular disease) (HCC) 06/18/09    RCE  . Normal cardiac stress test     low risk Nuc 2009, Feb 2011  . COPD (chronic obstructive pulmonary disease) (HCC)    Past Surgical History:  Past  Surgical History  Procedure Laterality Date  . Appendectomy  1971  . Abdominal hysterectomy  1971  . Tonsillectomy    . Tubal ligation    . Cesarean section    . Flexible sigmoidoscopy      diverticulitis  . Laparotomy      X 3 with adhesions lysis  . Foot surgery      right foot X 2  . Carotid endarterectomy  06/18/09    RCE   HPI:  80 y.o. female admitted with fever, mild leukocytosis, confusion and agitation. Patient has a history of mild dementia. MRI showed small patchy acute ischemic infarctions involving left periatrial white matter and left temporoparietal region. Old lacunar infarcts as well as bilateral occipital infarcts some noted.   Assessment / Plan / Recommendation Clinical Impression  Pt presents with a mild fluent aphasia complicated by a baseline dementia.  Her husband reports changes in communication since admission.  Pt with difficulty naming to confrontation; demonstrates paraphasias and perseveratory responses.  Has difficulty following multistep commands and identifying a target picture from an array when given a single word. Pt with impaired insight into deficits - agree with recs for CIR - PTA, pt was driving, grocery shopping, cooking, and doing the housekeeping.     SLP Assessment  Patient needs continued Speech Lanaguage Pathology Services    Follow Up Recommendations  Inpatient Rehab    Frequency and Duration min 2x/week  1 week      SLP Evaluation Prior Functioning  Cognitive/Linguistic Baseline: Baseline deficits Baseline deficit details: mild dementia Type of Home: House Available Help at Discharge: Family;Available 24 hours/day  Cognition  Overall Cognitive Status: Impaired/Different from baseline Arousal/Alertness: Awake/alert Orientation Level: Disoriented to time Attention: Sustained Sustained Attention: Appears intact Memory: Impaired Memory Impairment: Retrieval deficit (baseline) Awareness: Impaired Awareness Impairment: Emergent  impairment Problem Solving: Impaired Problem Solving Impairment: Functional basic Safety/Judgment: Impaired    Comprehension  Auditory Comprehension Overall Auditory Comprehension: Impaired Yes/No Questions: Impaired Complex Questions: 25-49% accurate Commands: Impaired Multistep Basic Commands: 25-49% accurate Conversation: Simple Visual Recognition/Discrimination Discrimination: Exceptions to [employer] Drawings:  (50%) Reading Comprehension Reading Status: Impaired Functional Environmental (signs, name badge): Impaired (50 % accuracy)    Expression Expression Primary Mode of Expression: Verbal Verbal Expression Overall Verbal Expression: Impaired Initiation: No impairment Level of Generative/Spontaneous Verbalization: Sentence Repetition: No impairment Naming: Impairment Responsive: 51-75% accurate Confrontation: Impaired Black/White Line Drawings:  (50%) Convergent: 50-74% accurate Divergent: 0-24% accurate Verbal Errors: Semantic paraphasias;Perseveration Written Expression Dominant Hand: Right Written Expression: Not tested   Oral / Motor Oral Motor/Sensory Function Overall Oral Motor/Sensory Function: Within functional limits Motor Speech Overall Motor Speech: Appears within functional limits for tasks assessed    Casey Harrell Casey Harrell 04/02/2015, 3:49 PM

## 2015-04-03 DIAGNOSIS — I951 Orthostatic hypotension: Secondary | ICD-10-CM

## 2015-04-03 DIAGNOSIS — G8191 Hemiplegia, unspecified affecting right dominant side: Secondary | ICD-10-CM

## 2015-04-03 DIAGNOSIS — N390 Urinary tract infection, site not specified: Secondary | ICD-10-CM

## 2015-04-03 DIAGNOSIS — R197 Diarrhea, unspecified: Secondary | ICD-10-CM

## 2015-04-03 LAB — BASIC METABOLIC PANEL
Anion gap: 6 (ref 5–15)
BUN: 21 mg/dL — ABNORMAL HIGH (ref 6–20)
CO2: 23 mmol/L (ref 22–32)
Calcium: 8.6 mg/dL — ABNORMAL LOW (ref 8.9–10.3)
Chloride: 110 mmol/L (ref 101–111)
Creatinine, Ser: 0.88 mg/dL (ref 0.44–1.00)
GFR calc Af Amer: >60 mL/min (ref >60)
GFR calc non Af Amer: >60 mL/min (ref >60)
Glucose, Bld: 115 mg/dL — ABNORMAL HIGH (ref 65–99)
Potassium: 3.8 mmol/L (ref 3.5–5.1)
Sodium: 139 mmol/L (ref 135–145)

## 2015-04-03 LAB — CBC
HCT: 36 % (ref 36.0–46.0)
Hemoglobin: 11.2 g/dL — ABNORMAL LOW (ref 12.0–15.0)
MCH: 25.5 pg — ABNORMAL LOW (ref 26.0–34.0)
MCHC: 31.1 g/dL (ref 30.0–36.0)
MCV: 81.8 fL (ref 78.0–100.0)
Platelets: 256 10*3/uL (ref 150–400)
RBC: 4.4 MIL/uL (ref 3.87–5.11)
RDW: 16.8 % — ABNORMAL HIGH (ref 11.5–15.5)
WBC: 12.1 10*3/uL — ABNORMAL HIGH (ref 4.0–10.5)

## 2015-04-03 LAB — GLUCOSE, CAPILLARY
Glucose-Capillary: 104 mg/dL — ABNORMAL HIGH (ref 65–99)
Glucose-Capillary: 145 mg/dL — ABNORMAL HIGH (ref 65–99)
Glucose-Capillary: 163 mg/dL — ABNORMAL HIGH (ref 65–99)
Glucose-Capillary: 201 mg/dL — ABNORMAL HIGH (ref 65–99)

## 2015-04-03 NOTE — Progress Notes (Signed)
STROKE TEAM PROGRESS NOTE   HISTORY Casey Harrell is a 80 y.o. female who presented with confusion and agitation. She has been having some diarrhea at home and had a mild leukocytosis and fever after arrival here. She received ativan and haldol for agitation earlier. Per her husband, she has mild dementia at baseline. She has difficulty remembering things that happened in the previous days but still is able to interact appropriately most of the time. She went to bed normal last night, but when she woke this morning she was acting confused. Of note, she was having diarrhea for the past 2 days, and has had some nausea today. She is only moderately cooperative with examination and history. She is LKW on 11/22 prior to bed.  Patient was not administered TPA secondary to delay in arrival.  She was admitted for further evaluation and treatment.   SUBJECTIVE (INTERVAL HISTORY) Multiple family members present. The patient's speech is clear. The right upper extremity remains flaccid.   OBJECTIVE Temp:  [97.5 F (36.4 C)-99 F (37.2 C)] 99 F (37.2 C) (11/26 0600) Pulse Rate:  [55-70] 70 (11/26 0600) Cardiac Rhythm:  [-] Normal sinus rhythm (11/26 0700) Resp:  [16-20] 18 (11/26 0600) BP: (91-170)/(38-80) 170/80 mmHg (11/26 0600) SpO2:  [96 %-100 %] 100 % (11/26 0600)  CBC:   Recent Labs Lab 04/02/15 0700 04/03/15 0333  WBC 10.1 12.1*  HGB 12.6 11.2*  HCT 40.5 36.0  MCV 81.3 81.8  PLT 271 256    Basic Metabolic Panel:   Recent Labs Lab 04/02/15 0700 04/03/15 0333  NA 137 139  K 3.8 3.8  CL 103 110  CO2 27 23  GLUCOSE 135* 115*  BUN 31* 21*  CREATININE 1.16* 0.88  CALCIUM 9.5 8.6*    Lipid Panel:     Component Value Date/Time   CHOL 137 04/01/2015 0422   TRIG 174* 04/01/2015 0422   HDL 26* 04/01/2015 0422   CHOLHDL 5.3 04/01/2015 0422   VLDL 35 04/01/2015 0422   LDLCALC 76 04/01/2015 0422   HgbA1c:  Lab Results  Component Value Date   HGBA1C 8.9* 04/01/2015   Urine  Drug Screen:     Component Value Date/Time   LABOPIA NONE DETECTED 04/02/2015 1245   COCAINSCRNUR NONE DETECTED 04/02/2015 1245   LABBENZ NONE DETECTED 04/02/2015 1245   AMPHETMU NONE DETECTED 04/02/2015 1245   THCU NONE DETECTED 04/02/2015 1245   LABBARB NONE DETECTED 04/02/2015 1245      IMAGING I have personally reviewed the radiological images below and agree with the radiology interpretations.  Ct Abdomen Pelvis Wo Contrast 03/31/2015  1. No acute findings or explanation for the patient's symptoms. 2. Bladder distention, likely accounting for mild intrarenal collecting system dilatation. No evidence of urinary tract calculus or secondary signs of ureteral obstruction. 3. Hepatic steatosis, colonic diverticulosis and atherosclerosis noted.   Dg Chest 2 View 03/31/2015   Cardiomegaly without acute process.  No interval change.   Ct Head Wo Contrast 03/31/2015  Findings concerning for recent/ possibly acute infarct in the medial posterior right occipital lobe. Prior infarct posterior left occipital lobe. There is atrophy with patchy small vessel disease in the periventricular white matter bilaterally. No hemorrhage or mass effect. Sinusitis with air-fluid level in the right maxillary antrum.    MRI HEAD  04/01/2015  IMPRESSION: 1. Small patchy acute ischemic infarcts involving the left periatrial white matter and left temporoparietal region. 2. Remote bilateral occipital infarcts, left greater than right, with scattered remote lacunar  infarcts as above. 3. Cerebral atrophy with moderate chronic small vessel ischemic disease.  MRA HEAD  04/01/2015  IMPRESSION: 1. No large or proximal arterial branch occlusion. 2. Focal high-grade stenosis within the proximal left M1 segment. 3. Extensive atheromatous throughout the posterior circulation, with focal severe stenoses within the left V4 segment, proximal right SCA, and mid left P2 segment.   CT head without contrast 04/02/2015 Bilateral  remote occipital lobe infarcts with chronic involutional change. Known small patchy acute infarcts left hemisphere seen on MRI performed 04/01/2015 not well appreciated on CT.   MRI HEAD without contrast 04/02/2015 1. Interval extension in the recently identified left periatrial and temporoparietal infarcts, with multiple scattered new ischemic infarcts involving the left temporoparietal region and left occipital lobe. This most likely resulted from recent hypotensive episode in the setting of known high-grade stenoses in the left M1 and/or P2 segments as seen on recent MRA. 2. Otherwise stable appearance of the brain.  EEG - This EEG is abnormal with mild generalized nonspecific continuous slowing of cerebral activity. No evidence of an epileptic disorder was demonstrated.   PHYSICAL EXAM  Temp:  [97.5 F (36.4 C)-99 F (37.2 C)] 99 F (37.2 C) (11/26 0600) Pulse Rate:  [55-70] 70 (11/26 0600) Resp:  [16-20] 18 (11/26 0600) BP: (91-170)/(38-80) 170/80 mmHg (11/26 0600) SpO2:  [96 %-100 %] 100 % (11/26 0600)  General - Well nourished, well developed, calm, sitting up in bed.  Ophthalmologic - Fundi not visualized due to noncooperation.  Cardiovascular - Regular rhythm and rate.  Mental Status -  Awake, alert, orientated to place and self and people and month and current president, but not to year. Following commands, normal expression, able to repeat.   Cranial Nerves II - XII - II - Visual field intact OU. III, IV, VI - Extraocular movements intact. V - Facial sensation intact bilaterally. VII - Facial movement intact bilaterally. VIII - Hearing & vestibular intact bilaterally. X - Palate elevates symmetrically. XI - Chin turning & shoulder shrug intact bilaterally. XII - Tongue protrusion intact.  Motor Strength - The patient's strength was normal in all extremities except right UE 0/5.    Motor Tone - Muscle tone was assessed at the neck and appendages and was  normal.  Reflexes - The patient's reflexes were 1+ in all extremities and she had no pathological reflexes.  Sensory - Light touch, temperature/pinprick were assessed and were symmetrical.    Coordination - The patient had normal movements in the hands and feet with no ataxia or dysmetria.  Tremor was absent.  Gait and Station - not tested due to orthostatic hypotension.   ASSESSMENT/PLAN Ms. ADEOLA DENNEN is a 80 y.o. female with history of DM, hypertensilon, asthma, PVD, COPD presenting with agitation and increased confusion following diarrhea x 2 days at home. She did not receive IV t-PA due to delay in arrival.   Altered mental status and confusion, much improved, likely multifactorial with stroke, dehydration, diarrhea, and hypertensive urgency on admission  Mental status much improved overnight  UA negative  CXR negative  afebrile  EEG no seizure like activity  No need for LP at this time  Stop amitriptyline     Stroke:  Small L brain punctate infarcts in setting of old bilateral occipital small infarcts, likely embolic secondary to unknown source.   MRI  Small patchy left parietal white matter and left temporoparietal region infarcts. Remote bilateral occipital infarcts, left greater than right. scattered remote lacunes. Atrophy. chronic small  vessel disease.  MRA  Focal L M1 stenosis.  focal severe stenoses L V4 segment, proximal R SCA, and mid L P2 segment.  Carotid Doppler - Preliminary report: Bilateral: 1-39% ICA stenosis. Vertebral artery flow is antegrade.  2D Echo  EF 60-65%. No cardiac source of emboli identified.  LDL 76  HgbA1c 9.0  Lovenox 40 mg sq daily for VTE prophylaxis Diet Heart Room service appropriate?: Yes; Fluid consistency:: Thin  aspirin 81 mg daily prior to admission, now on aspirin 325 mg daily. Given M1 stenosis, will change to  aspirin 81 mg and clopidogrel 75 mg orally every day x 3 months for secondary stroke prevention. After 3  months, change to plavix alone. Long-term dual antiplatelets are contraindicated due to risk for intracerebral hemorrhage.  Recommend outpatient 30 day cardiac event monitoring to rule out afib.   Patient and husband counseled to be compliant with her antithrombotic medications  Ongoing aggressive stroke risk factor management  Therapy recommendations:  CIR  Disposition:  pending   Hypertensive Urgency / orthostatic hypotension  BP elevated  BP goal gradually normalize in 5-7 days  Orthostatic this am, put on IV bolus and trendelenburg position  D/c cardizem, avapro  Decreased coreg from 25 to 6.25mg    Close monitoring  Hyperlipidemia  Home meds:  No statin  LDL 76, goal < 70  Added lipitor 10 mg this admission  Continue statin at discharge  Diabetes type II  HgbA1c 9.0, goal < 7.0  Uncontrolled  On levemir  SSI  CBG monitoring  Close follow up with PCP  Other Stroke Risk Factors  Advanced age  Family hx stroke (materal grandfather)  PVD  Other Active Problems  COPD  Hypoxia  Mild dementia at baseline  Patient needs to avoid hypotension and dehydration.  Extension of strokes by MRI as noted above felt secondary to hypotension.  Hospital day # 3   On fluids Pauletta BrownsZEYLIKMAN, Hannelore Bova   To contact Stroke Continuity provider, please refer to [email or web address]. After hours, contact General Neurology

## 2015-04-03 NOTE — Progress Notes (Signed)
[facility] PROGRESS NOTE    Progress Note   ANGELISA RAMPONE POE:[id number] DOB: [birth year] DOA: 03/31/2015 PCP: [name], MD   Brief Narrative:   Casey Harrell is an 80 y.o. female past medical history of diabetes hypertension and COPD is brought in by the family because of confusion as soon as she woke up in the morning which progressively got worse throughout the day with agitation.  Assessment/Plan:   Acute ischemic stroke [facility]): A1c 9.0, fasting lipid panel  HDL < 40, LDL 76 cont statins. Repeated MRI showed new ischemic event, she does have a high grade stenosis on left M1. Apprciated Neurology assistance. Allow permissive Hypertension, cont to hold antihypertensive medication. PT consult, OT consult, pending She will need aspirin and Plavix for 3 months for secondary prevention after LP is completed. Cardiac Monitoring with no events. EEG results are pending. She will need a loop recorder as an outpatient to rule out atrial fibrillation. Carotid doppler shows minimal ICA stenosis  Acute encephalopathy: Unclear etiology of why she is confused, it is now improved. She doesn't have a past medical history of dementia. Now resolved.  Fever in adult: No fever since admission. UC positive for klebsiella start on rocephin.  Type 2 diabetes mellitus, uncontrolled (HCC): Cont long acting insulin 30 units twice today continue sliding-scale insulin poorly controlled diabetes mellitus with hemoglobin A1c of 9. BG has improved on current regimen.  Sleep apnea: She cannot tolerate C-pap   Acute respiratory failure with hypoxia (HCC) - Unclear etiology now resolved. Question if due to Haldol as it was a single episode.  Hypertensive urgency   Depression: Continue current home regimen.   DVT Prophylaxis - Lovenox ordered.  Family Communication: husband Disposition Plan: Home in 2 days Code Status:     Code Status Orders        Start     Ordered   03/31/15 2048  Full code   Continuous     03/31/15 2047        IV Access:    Peripheral IV   Procedures and diagnostic studies:   Ct Head Wo Contrast  04/02/2015  CLINICAL DATA:  Woke up this morning with confusion, agitation, getting worse, left arm weakness EXAM: CT HEAD WITHOUT CONTRAST TECHNIQUE: Contiguous axial images were obtained from the base of the skull through the vertex without intravenous contrast. COMPARISON:  Multiple prior studies including 03/31/2015 CT scan FINDINGS: Encephalomalacia in the bilateral occipital lobes is stable. There is severe diffuse low attenuation in the deep white matter. There is diffuse cortical atrophy. There is no hemorrhage or extra-axial fluid. There is no mass or hydrocephalus. Again identified is an air-fluid level in the right maxillary sinus with inflammatory change right sphenoid sinus, left sphenoid sinus, and left maxillary sinus. The calvarium is intact. IMPRESSION: Bilateral remote occipital lobe infarcts with chronic involutional change. Known small patchy acute infarcts left hemisphere seen on MRI performed 04/01/2015 not well appreciated on CT. Electronically Signed   By: Esperanza Heir M.D.   On: 04/02/2015 17:01   Mr Brain Wo Contrast  04/03/2015  CLINICAL DATA:  Initial evaluation for mental status change, increased right-sided weakness. Hypotensive episode. EXAM: MRI HEAD WITHOUT CONTRAST TECHNIQUE: Multiplanar, multiecho pulse sequences of the brain and surrounding structures were obtained without intravenous contrast. COMPARISON:  Previous CT from earlier the same day as well as prior MRI from 04/01/2015. FINDINGS: There has been slight interval extension of previously seen patchy ischemic infarcts involving the left periatrial white  matter and left temporal parietal region, with multiple new scattered ischemic infarcts involving the cortical gray matter and deep white matter of the left parietal lobe (series 3, image 28, 30) as  well as the cortical and subcortical white matter of the left occipital lobe (series 3, image 24). New ischemic changes within the periatrial white matter (series 3, image 20). Ischemic change within the posterior left corona radiata also now more prominent (series 3, image 33). Tiny new infarct within the deep white matter of the anterior left centrum semi ovale (series 3, image 37). No hemorrhage or mass effect. Intravascular flow voids are grossly maintained. Atrophy with chronic small vessel ischemic disease again noted. Remote bilateral occipital infarcts, left larger than right. Scattered remote lacunar infarcts within the centrum semi ovales, basal ganglia, and thalami. Few chronic micro hemorrhages again noted. No mass lesion, midline shift, or mass effect. No hydrocephalus. No extra-axial fluid collection. Craniocervical junction normal.  Pituitary gland normal. No acute abnormality about the orbits. Mucosal thickening within the left maxillary sinus with air-fluid level in the right maxillary sinus. Mild opacity within the right sphenoid sinus. These changes are stable. Small right mastoid effusion, also stable. Bone marrow signal intensity within normal limits. Scalp soft tissues demonstrate no acute abnormality. IMPRESSION: 1. Interval extension in the recently identified left periatrial and temporoparietal infarcts, with multiple scattered new ischemic infarcts involving the left temporoparietal region and left occipital lobe. This most likely resulted from recent hypotensive episode in the setting of known high-grade stenoses in the left M1 and/or P2 segments as seen on recent MRA. 2. Otherwise stable appearance of the brain. Electronically Signed   By: Rise Mu M.D.   On: 04/03/2015 00:45     Medical Consultants:    None.  Anti-Infectives:   Anti-infectives    Start     Dose/Rate Route Frequency Ordered Stop   04/02/15 1700  cefTRIAXone (ROCEPHIN) 1 g in dextrose 5 % 50 mL  IVPB     1 g 100 mL/hr over 30 Minutes Intravenous Every 24 hours 04/02/15 1539        Subjective:    Atilano Median She feels better than yesterday. Has not had any other episodes of fainting.  Objective:    Filed Vitals:   04/03/15 0000 04/03/15 0200 04/03/15 0400 04/03/15 0600  BP: 140/47 135/56 157/67 170/80  Pulse: 63 62 61 70  Temp: 98.2 F (36.8 C) 98.4 F (36.9 C) 97.9 F (36.6 C) 99 F (37.2 C)  TempSrc: Oral Oral Oral Oral  Resp: 16 16 18 18   Height:      Weight:      SpO2: 96% 98% 97% 100%    Intake/Output Summary (Last 24 hours) at 04/03/15 0856 Last data filed at 04/02/15 1900  Gross per 24 hour  Intake 2264.17 ml  Output    300 ml  Net 1964.17 ml   Filed Weights   03/31/15 2041  Weight: 65.4 kg (144 lb 2.9 oz)    Exam: Gen:  NAD Cardiovascular:  RRR, No M/R/G Chest and lungs:   CTAB Abdomen:  Abdomen soft, NT/ND, + BS Extremities:  No C/E/C Neuro: She is awake alert and oriented 3, With left upper extremity weakness.   Data Reviewed:    Labs: Basic Metabolic Panel:  Recent Labs Lab 03/31/15 1419 04/02/15 0700 04/03/15 0333  NA 140 137 139  K 4.1 3.8 3.8  CL 103 103 110  CO2 25 27 23   GLUCOSE 199* 135* 115*  BUN 22* 31* 21*  CREATININE 1.02* 1.16* 0.88  CALCIUM 9.5 9.5 8.6*   GFR Estimated Creatinine Clearance: 47.5 mL/min (by C-G formula based on Cr of 0.88). Liver Function Tests:  Recent Labs Lab 03/31/15 1419  AST 34  ALT 31  ALKPHOS 84  BILITOT 0.6  PROT 8.3*  ALBUMIN 4.5    Recent Labs Lab 03/31/15 1419  LIPASE 19    Recent Labs Lab 04/02/15 0700  AMMONIA 20   Coagulation profile No results for input(s): INR, PROTIME in the last 168 hours.  CBC:  Recent Labs Lab 03/31/15 1419 04/02/15 0700 04/03/15 0333  WBC 12.0* 10.1 12.1*  HGB 13.9 12.6 11.2*  HCT 43.2 40.5 36.0  MCV 80.0 81.3 81.8  PLT 300 271 256   Cardiac Enzymes: No results for input(s): CKTOTAL, CKMB, CKMBINDEX, TROPONINI in the  last 168 hours. BNP (last 3 results) No results for input(s): PROBNP in the last 8760 hours. CBG:  Recent Labs Lab 04/02/15 1016 04/02/15 1152 04/02/15 1721 04/02/15 2215 04/03/15 0627  GLUCAP 170* 147* 261* 182* 104*   D-Dimer: No results for input(s): DDIMER in the last 72 hours. Hgb A1c:  Recent Labs  04/01/15 0423  HGBA1C 8.9*   Lipid Profile:  Recent Labs  04/01/15 0422  CHOL 137  HDL 26*  LDLCALC 76  TRIG 409*  CHOLHDL 5.3   Thyroid function studies: No results for input(s): TSH, T4TOTAL, T3FREE, THYROIDAB in the last 72 hours.  Invalid input(s): FREET3 Anemia work up: No results for input(s): VITAMINB12, FOLATE, FERRITIN, TIBC, IRON, RETICCTPCT in the last 72 hours. Sepsis Labs:  Recent Labs Lab 03/31/15 1419 03/31/15 1542 04/02/15 0700 04/03/15 0333  WBC 12.0*  --  10.1 12.1*  LATICACIDVEN  --  2.04*  --   --    Microbiology Recent Results (from the past 240 hour(s))  Urine culture     Status: None   Collection Time: 03/31/15  3:27 PM  Result Value Ref Range Status   Specimen Description URINE, CATHETERIZED  Final   Special Requests Normal  Final   Culture   Final    >=100,000 COLONIES/mL KLEBSIELLA PNEUMONIAE Performed at [facility]    Report Status 04/02/2015 FINAL  Final   Organism ID, Bacteria KLEBSIELLA PNEUMONIAE  Final      Susceptibility   Klebsiella pneumoniae - MIC*    AMPICILLIN 16 RESISTANT Resistant     CEFAZOLIN <=4 SENSITIVE Sensitive     CEFTRIAXONE <=1 SENSITIVE Sensitive     CIPROFLOXACIN <=0.25 SENSITIVE Sensitive     GENTAMICIN <=1 SENSITIVE Sensitive     IMIPENEM <=0.25 SENSITIVE Sensitive     NITROFURANTOIN <=16 SENSITIVE Sensitive     TRIMETH/SULFA <=20 SENSITIVE Sensitive     AMPICILLIN/SULBACTAM 4 SENSITIVE Sensitive     PIP/TAZO <=4 SENSITIVE Sensitive     * >=100,000 COLONIES/mL KLEBSIELLA PNEUMONIAE     Medications:   . albumin human  12.5 g Intravenous Q6H  . aspirin EC  81 mg Oral Daily    . atorvastatin  10 mg Oral q1800  . cefTRIAXone (ROCEPHIN)  IV  1 g Intravenous Q24H  . clopidogrel  75 mg Oral Daily  . DULoxetine  60 mg Oral Daily  . enoxaparin (LOVENOX) injection  40 mg Subcutaneous Q24H  . hydrocortisone  25 mg Rectal BID  . insulin aspart  0-5 Units Subcutaneous QHS  . insulin aspart  0-9 Units Subcutaneous TID WC  . insulin detemir  30 Units Subcutaneous BID  .  levothyroxine  50 mcg Oral QAC breakfast  . pantoprazole  40 mg Oral Daily  . pregabalin  150 mg Oral Daily   Continuous Infusions: . sodium chloride 125 mL/hr at 04/02/15 1755    Time spent: 15 min   LOS: 3 days   Marinda Elk  [facility] Pager [phone]  *Please refer to [email or web address], password [email or web address] to get updated schedule on who will round on this patient, as hospitalists switch teams weekly. If 7PM-7AM, please contact night-coverage at [email or web address], password [email or web address] for any overnight needs.  04/03/2015, 8:56 AM

## 2015-04-03 NOTE — Consult Note (Signed)
Physical Medicine and Rehabilitation Consult Reason for Consult: right hemiparesis Referring Physician: Robb Matar   HPI: Casey Harrell is a 80 y.o. female with a history of dementia admitted on 11/23 with fever, confusion. MRI revealed acute infarcts involving the left periatrial white matter and left temporal-parietal region in addition to chronic lacunar and bilateral occipital infarcts. Pt noted to have acute neuro changes while sitting up in chair on 11/25 with aphasia, right upper > lower hemiparesis. Symptoms improved somewhat with bedrest and IVF for relative orthostasis. No acute findings on head CT but repeat MRI revealed interval extension of left periatrial and temporal-parietal infarcts with new infarcts involving the left temporal-parietal and occipital lobe. Pt was diagnosed with a klebsiella UTI for which she was started on rocephin.    Review of Systems  Constitutional: Positive for malaise/fatigue. Negative for fever and chills.  HENT: Positive for hearing loss.   Eyes: Positive for blurred vision. Negative for pain.  Respiratory: Negative for cough and hemoptysis.   Cardiovascular: Negative for chest pain.  Gastrointestinal: Negative for heartburn and nausea.  Genitourinary: Positive for dysuria.  Musculoskeletal: Negative for myalgias and neck pain.  Skin: Negative for rash.  Neurological: Positive for dizziness, tingling, sensory change, speech change, focal weakness, weakness and headaches.  Psychiatric/Behavioral: Negative for depression and suicidal ideas.   Past Medical History  Diagnosis Date  . DIABETES-TYPE 2 07/15/2008  . DIABETIC PERIPHERAL NEUROPATHY 09/02/2009  . HYPERTENSION 07/15/2008  . ASTHMA 07/15/2008  . GERD 07/15/2008  . PVD (peripheral vascular disease) (HCC) 06/18/09    RCE  . Normal cardiac stress test     low risk Nuc 2009, Feb 2011  . COPD (chronic obstructive pulmonary disease) [facility])    Past Surgical History  Procedure Laterality Date  .  Appendectomy  1971  . Abdominal hysterectomy  1971  . Tonsillectomy    . Tubal ligation    . Cesarean section    . Flexible sigmoidoscopy      diverticulitis  . Laparotomy      X 3 with adhesions lysis  . Foot surgery      right foot X 2  . Carotid endarterectomy  06/18/09    RCE   Family History  Problem Relation Age of Onset  . Diabetes Mother   . Arthritis Mother   . Heart disease Mother   . Heart disease Father   . Celiac disease Sister   . Stroke Maternal Grandfather   . Cancer Paternal Grandfather     lung  . Heart disease Sister   . Heart disease Sister   . Asthma Sister   . Heart disease Brother   . Cancer Brother 67    lung cancer   Social History:  reports that she has never smoked. She has never used smokeless tobacco. She reports that she does not drink alcohol. Her drug history is not on file. Allergies:  Allergies  Allergen Reactions  . Doxycycline Hyclate Nausea And Vomiting    REACTION: vomiting  . Iohexol      Code: HIVES, Desc: pt. states she breaks out in hives 06/15/08   . Morphine Sulfate     REACTION: hallucinations  . Moxifloxacin     REACTION: unknown   Medications Prior to Admission  Medication Sig Dispense Refill  . amitriptyline (ELAVIL) 25 MG tablet Take 1 tablet (25 mg total) by mouth at bedtime. 90 tablet 1  . AZELASTINE HCL OP Apply to eye.    Marland Kitchen  canagliflozin (INVOKANA) 300 MG TABS tablet Take 300 mg by mouth daily before breakfast. 30 tablet 11  . carvedilol (COREG) 25 MG tablet Take 1 tablet (25 mg total) by mouth 2 (two) times daily with a meal. 180 tablet 1  . fluticasone (VERAMYST) 27.5 MCG/SPRAY nasal spray Place 2 sprays into the nose daily.    . hydrocortisone (ANUSOL-HC) 25 MG suppository Place 1 suppository (25 mg total) rectally 2 (two) times daily. 24 suppository 0  . Insulin Detemir (LEVEMIR FLEXTOUCH) 100 UNIT/ML Pen Inject 40 Units into the skin 2 (two) times daily. 15 mL 5  . insulin lispro (HUMALOG) 100 UNIT/ML  KiwkPen INJECT 20 UNITS AT BREAKFAST, 10 UNITS AT LUNCH, AND 20 UNITS WITH SUPPER. 15 pen 5  . lansoprazole (PREVACID) 15 MG capsule Take 15 mg by mouth as needed (acid reflux).    Marland Kitchen. levothyroxine (SYNTHROID, LEVOTHROID) 50 MCG tablet Take 1 tablet (50 mcg total) by mouth daily before breakfast. 90 tablet 0  . meclizine (ANTIVERT) 12.5 MG tablet Take 1 tablet (12.5 mg total) by mouth every 6 (six) hours as needed for dizziness. 30 tablet 0  . metFORMIN (GLUCOPHAGE) 1000 MG tablet Take 1 tablet (1,000 mg total) by mouth 2 (two) times daily with a meal. 180 tablet 3  . ondansetron (ZOFRAN ODT) 4 MG disintegrating tablet Take 1 tablet (4 mg total) by mouth every 8 (eight) hours as needed for nausea or vomiting. 10 tablet 0  . simvastatin (ZOCOR) 20 MG tablet TAKE 1 TABLET BY MOUTH AT BEDTIME 90 tablet 1  . valsartan (DIOVAN) 160 MG tablet TAKE 1 TABLET (160 MG TOTAL) BY MOUTH DAILY. 30 tablet 5  . albuterol (ACCUNEB) 1.25 MG/3ML nebulizer solution Take 1 ampule by nebulization every 6 (six) hours as needed for wheezing.    Marland Kitchen. aspirin 81 MG tablet Take 81 mg by mouth daily.      . B-D ULTRAFINE III SHORT PEN 31G X 8 MM MISC USE AS DIRECTED 100 each 1  . Calcium Citrate (CITRACAL PO) Take 1 tablet by mouth 2 (two) times daily.     . Cholecalciferol (VITAMIN D3) 2000 UNITS TABS Take 2,000 Units by mouth daily.     Marland Kitchen. CLEVER CHEK AUTO-CODE VOICE test strip     . diltiazem (CARTIA XT) 120 MG 24 hr capsule Take 1 capsule (120 mg total) by mouth daily. NEEDS APPOINTMENT FOR FUTURE REFILLS 30 capsule 0  . DULoxetine (CYMBALTA) 60 MG capsule TAKE 1 CAPSULE BY MOUTH DAILY 90 capsule 1  . Lancets MISC     . magnesium oxide (MAG-OX) 400 (241.3 MG) MG tablet TAKE 1 TABLET BY MOUTH DAILY 30 tablet 3  . NOVOLOG FLEXPEN 100 UNIT/ML FlexPen INJECT 9 TO 14 UNITS UNDER THE SKIN 2 TIMES DAILY WITH A MEAL 9 UNITS BEFORE BREAKFAST AND 14 UNITS AFTER SUPPER 100 mL 0  . pantoprazole (PROTONIX) 40 MG tablet TAKE 1 TABLET BY  MOUTH TWICE DAILY 180 tablet 3  . pregabalin (LYRICA) 150 MG capsule TAKE ONE CAPSULE BY MOUTH DAILY 90 capsule 1  . promethazine (PHENERGAN) 12.5 MG tablet 1-2 tabs po q6h prn nausea 30 tablet 1    Home: Home Living Family/patient expects to be discharged to:: Private residence Living Arrangements: Spouse/significant other Available Help at Discharge: Family, Available 24 hours/day Type of Home: House Home Access: Stairs to enter Entergy CorporationEntrance Stairs-Number of Steps: 3 Home Layout: One level Bathroom Shower/Tub: Psychologist, counsellingWalk-in shower, Door Foot LockerBathroom Toilet: Standard Bathroom Accessibility: Yes Home Equipment: Cane - single  point, Walker - 2 wheels, Shower seat - built in Additional Comments: husband works during the day  Functional History: Prior Function Level of Independence: Independent with assistive device(s) Comments: husband assists her Functional Status:  Mobility: Bed Mobility Overal bed mobility: Needs Assistance Bed Mobility: Supine to Sit Supine to sit: Mod assist General bed mobility comments: mainly needs assist under trunk and to finish scooting to edge of bed, cues for management of RUE Transfers Overall transfer level: Needs assistance Equipment used: 1 person hand held assist Transfers: Sit to/from Stand, Stand Pivot Transfers Sit to Stand: Mod assist Stand pivot transfers: Mod assist General transfer comment: Mod assist to assist with lift/lower from recliner. Cues for safety.  Ambulation/Gait General Gait Details: unable to do more than transfer, did pass out this am    ADL: ADL Overall ADL's : Needs assistance/impaired Eating/Feeding: Moderate assistance, Sitting Grooming: Moderate assistance, Sitting Upper Body Bathing: Maximal assistance, Sitting Lower Body Bathing: Maximal assistance, Sit to/from stand Upper Body Dressing : Maximal assistance, Sitting Lower Body Dressing: Maximal assistance, Sit to/from stand Toilet Transfer: Moderate assistance, Cueing  for safety, Stand-pivot, BSC Toileting- Clothing Manipulation and Hygiene: Total assistance Functional mobility during ADLs: Moderate assistance General ADL Comments: Pt lethargic during session. Pt requires mod to total assist due to weakness throughout RUE and decreased overall cognition.   Cognition: Cognition Overall Cognitive Status: Impaired/Different from baseline Arousal/Alertness: Awake/alert Orientation Level: Oriented to person, Disoriented to time, Disoriented to situation, Disoriented to place Attention: Sustained Sustained Attention: Appears intact Memory: Impaired Memory Impairment: Retrieval deficit (baseline) Awareness: Impaired Awareness Impairment: Emergent impairment Problem Solving: Impaired Problem Solving Impairment: Functional basic Safety/Judgment: Impaired Cognition Arousal/Alertness: Awake/alert Behavior During Therapy: WFL for tasks assessed/performed Overall Cognitive Status: Impaired/Different from baseline Area of Impairment: Attention, Memory, Following commands, Safety/judgement, Awareness, Problem solving Current Attention Level: Selective Memory: Decreased short-term memory Following Commands: Follows one step commands with increased time, Follows multi-step commands inconsistently Safety/Judgement: Decreased awareness of safety, Decreased awareness of deficits Awareness: Intellectual Problem Solving: Slow processing, Decreased initiation, Difficulty sequencing, Requires verbal cues, Requires tactile cues General Comments: During interview process with home living enviornment and equipment pt incorrectly asnwering questions, husband present to correctly answer questions  Blood pressure 170/80, pulse 70, temperature 99 F (37.2 C), temperature source Oral, resp. rate 18, height  (1.575 m), weight 65.4 kg (144 lb 2.9 oz), SpO2 100 %. Physical Exam  Constitutional: She appears well-nourished.  HENT:  Head: Normocephalic and atraumatic.  Eyes:  Pupils are equal, round, and reactive to light.  Neck: No tracheal deviation present. No thyromegaly present.  Cardiovascular: Normal rate.   No murmur heard. Respiratory: Effort normal. No respiratory distress. She has no wheezes.  GI: She exhibits no distension. There is no tenderness.  Musculoskeletal: She exhibits no edema or tenderness.  Neurological: She is alert. A cranial nerve deficit is present. She exhibits normal muscle tone.  Right visual field loss---inconsistent on exam however, may be slight inattention. Oriented to name, place, reason she's here. RUE 0/5 with 1/2 sensation. RLE: grossly 2 to 3+/5 prox to distal--does sense pain. DTRs 1+  Skin: Skin is warm.  Psychiatric:  Slightly confused, non anxious or agitated    Results for orders placed or performed during the hospital encounter of 03/31/15 (from the past 24 hour(s))  Glucose, capillary     Status: Abnormal   Collection Time: 04/02/15 10:16 AM  Result Value Ref Range   Glucose-Capillary 170 (H) 65 - 99 mg/dL  Glucose, capillary  Status: Abnormal   Collection Time: 04/02/15 11:52 AM  Result Value Ref Range   Glucose-Capillary 147 (H) 65 - 99 mg/dL  Urine rapid drug screen (hosp performed)     Status: None   Collection Time: 04/02/15 12:45 PM  Result Value Ref Range   Opiates NONE DETECTED NONE DETECTED   Cocaine NONE DETECTED NONE DETECTED   Benzodiazepines NONE DETECTED NONE DETECTED   Amphetamines NONE DETECTED NONE DETECTED   Tetrahydrocannabinol NONE DETECTED NONE DETECTED   Barbiturates NONE DETECTED NONE DETECTED  Urinalysis with microscopic (not at [facility])     Status: Abnormal   Collection Time: 04/02/15 12:46 PM  Result Value Ref Range   Color, Urine YELLOW YELLOW   APPearance HAZY (A) CLEAR   Specific Gravity, Urine 1.015 1.005 - 1.030   pH 5.5 5.0 - 8.0   Glucose, UA >1000 (A) NEGATIVE mg/dL   Hgb urine dipstick NEGATIVE NEGATIVE   Bilirubin Urine NEGATIVE NEGATIVE   Ketones, ur NEGATIVE  NEGATIVE mg/dL   Protein, ur NEGATIVE NEGATIVE mg/dL   Nitrite POSITIVE (A) NEGATIVE   Leukocytes, UA SMALL (A) NEGATIVE   WBC, UA TOO NUMEROUS TO COUNT 0 - 5 WBC/hpf   RBC / HPF NONE SEEN 0 - 5 RBC/hpf   Bacteria, UA MANY (A) NONE SEEN   Squamous Epithelial / LPF 0-5 (A) NONE SEEN  Glucose, capillary     Status: Abnormal   Collection Time: 04/02/15  5:21 PM  Result Value Ref Range   Glucose-Capillary 261 (H) 65 - 99 mg/dL   Comment 1 Notify RN    Comment 2 Document in Chart   Glucose, capillary     Status: Abnormal   Collection Time: 04/02/15 10:15 PM  Result Value Ref Range   Glucose-Capillary 182 (H) 65 - 99 mg/dL   Comment 1 Notify RN    Comment 2 Document in Chart   CBC     Status: Abnormal   Collection Time: 04/03/15  3:33 AM  Result Value Ref Range   WBC 12.1 (H) 4.0 - 10.5 K/uL   RBC 4.40 3.87 - 5.11 MIL/uL   Hemoglobin 11.2 (L) 12.0 - 15.0 g/dL   HCT 40.9 81.1 - 91.4 %   MCV 81.8 78.0 - 100.0 fL   MCH 25.5 (L) 26.0 - 34.0 pg   MCHC 31.1 30.0 - 36.0 g/dL   RDW 78.2 (H) 95.6 - 21.3 %   Platelets 256 150 - 400 K/uL  Basic metabolic panel     Status: Abnormal   Collection Time: 04/03/15  3:33 AM  Result Value Ref Range   Sodium 139 135 - 145 mmol/L   Potassium 3.8 3.5 - 5.1 mmol/L   Chloride 110 101 - 111 mmol/L   CO2 23 22 - 32 mmol/L   Glucose, Bld 115 (H) 65 - 99 mg/dL   BUN 21 (H) 6 - 20 mg/dL   Creatinine, Ser 0.86 0.44 - 1.00 mg/dL   Calcium 8.6 (L) 8.9 - 10.3 mg/dL   GFR calc non Af Amer >60 >60 mL/min   GFR calc Af Amer >60 >60 mL/min   Anion gap 6 5 - 15  Glucose, capillary     Status: Abnormal   Collection Time: 04/03/15  6:27 AM  Result Value Ref Range   Glucose-Capillary 104 (H) 65 - 99 mg/dL   Comment 1 Notify RN    Comment 2 Document in Chart    Ct Head Wo Contrast  04/02/2015  CLINICAL DATA:  Woke up  this morning with confusion, agitation, getting worse, left arm weakness EXAM: CT HEAD WITHOUT CONTRAST TECHNIQUE: Contiguous axial images were  obtained from the base of the skull through the vertex without intravenous contrast. COMPARISON:  Multiple prior studies including 03/31/2015 CT scan FINDINGS: Encephalomalacia in the bilateral occipital lobes is stable. There is severe diffuse low attenuation in the deep white matter. There is diffuse cortical atrophy. There is no hemorrhage or extra-axial fluid. There is no mass or hydrocephalus. Again identified is an air-fluid level in the right maxillary sinus with inflammatory change right sphenoid sinus, left sphenoid sinus, and left maxillary sinus. The calvarium is intact. IMPRESSION: Bilateral remote occipital lobe infarcts with chronic involutional change. Known small patchy acute infarcts left hemisphere seen on MRI performed 04/01/2015 not well appreciated on CT. Electronically Signed   By: Esperanza Heir M.D.   On: 04/02/2015 17:01   Mr Brain Wo Contrast  04/03/2015  CLINICAL DATA:  Initial evaluation for mental status change, increased right-sided weakness. Hypotensive episode. EXAM: MRI HEAD WITHOUT CONTRAST TECHNIQUE: Multiplanar, multiecho pulse sequences of the brain and surrounding structures were obtained without intravenous contrast. COMPARISON:  Previous CT from earlier the same day as well as prior MRI from 04/01/2015. FINDINGS: There has been slight interval extension of previously seen patchy ischemic infarcts involving the left periatrial white matter and left temporal parietal region, with multiple new scattered ischemic infarcts involving the cortical gray matter and deep white matter of the left parietal lobe (series 3, image 28, 30) as well as the cortical and subcortical white matter of the left occipital lobe (series 3, image 24). New ischemic changes within the periatrial white matter (series 3, image 20). Ischemic change within the posterior left corona radiata also now more prominent (series 3, image 33). Tiny new infarct within the deep white matter of the anterior left  centrum semi ovale (series 3, image 37). No hemorrhage or mass effect. Intravascular flow voids are grossly maintained. Atrophy with chronic small vessel ischemic disease again noted. Remote bilateral occipital infarcts, left larger than right. Scattered remote lacunar infarcts within the centrum semi ovales, basal ganglia, and thalami. Few chronic micro hemorrhages again noted. No mass lesion, midline shift, or mass effect. No hydrocephalus. No extra-axial fluid collection. Craniocervical junction normal.  Pituitary gland normal. No acute abnormality about the orbits. Mucosal thickening within the left maxillary sinus with air-fluid level in the right maxillary sinus. Mild opacity within the right sphenoid sinus. These changes are stable. Small right mastoid effusion, also stable. Bone marrow signal intensity within normal limits. Scalp soft tissues demonstrate no acute abnormality. IMPRESSION: 1. Interval extension in the recently identified left periatrial and temporoparietal infarcts, with multiple scattered new ischemic infarcts involving the left temporoparietal region and left occipital lobe. This most likely resulted from recent hypotensive episode in the setting of known high-grade stenoses in the left M1 and/or P2 segments as seen on recent MRA. 2. Otherwise stable appearance of the brain. Electronically Signed   By: Rise Mu M.D.   On: 04/03/2015 00:45    Assessment/Plan: Diagnosis: Left temporal-parietal-occipital CVA's with right hemiparesis, confusion, visual loss 1. Does the need for close, 24 hr/day medical supervision in concert with the patient's rehab needs make it unreasonable for this patient to be served in a less intensive setting? Yes 2. Co-Morbidities requiring supervision/potential complications: uro sepsis, orthostatic hypotension, mild dementia 3. Due to bladder management, bowel management, safety and skin/wound care, does the patient require 24 hr/day rehab nursing?  Yes 4.  Does the patient require coordinated care of a physician, rehab nurse, PT (1-2 hrs/day, 5 days/week), OT (1-2 hrs/day, 5 days/week) and SLP (1-2 hrs/day, 5 days/week) to address physical and functional deficits in the context of the above medical diagnosis(es)? Yes Addressing deficits in the following areas: balance, endurance, locomotion, strength, transferring, bowel/bladder control, bathing, dressing, feeding, grooming, toileting, cognition, speech and psychosocial support 5. Can the patient actively participate in an intensive therapy program of at least 3 hrs of therapy per day at least 5 days per week? Yes 6. The potential for patient to make measurable gains while on inpatient rehab is excellent 7. Anticipated functional outcomes upon discharge from inpatient rehab are supervision  with PT, supervision and min assist with OT, supervision with SLP. 8. Estimated rehab length of stay to reach the above functional goals is: 13-20 days 9. Does the patient have adequate social supports and living environment to accommodate these discharge functional goals? Yes 10. Anticipated D/C setting: Home 11. Anticipated post D/C treatments: HH therapy and Outpatient therapy 12. Overall Rehab/Functional Prognosis: excellent  RECOMMENDATIONS: This patient's condition is appropriate for continued rehabilitative care in the following setting: CIR Patient has agreed to participate in recommended program. Yes Note that insurance prior authorization may be required for reimbursement for recommended care.  Comment: Rehab Admissions Coordinator to follow up.  Thanks,  Ranelle Oyster, MD, [place] [facility]     04/03/2015

## 2015-04-04 LAB — BASIC METABOLIC PANEL
Anion gap: 9 (ref 5–15)
BUN: 15 mg/dL (ref 6–20)
CO2: 25 mmol/L (ref 22–32)
Calcium: 9.7 mg/dL (ref 8.9–10.3)
Chloride: 104 mmol/L (ref 101–111)
Creatinine, Ser: 0.65 mg/dL (ref 0.44–1.00)
GFR calc Af Amer: >60 mL/min (ref >60)
GFR calc non Af Amer: >60 mL/min (ref >60)
Glucose, Bld: 148 mg/dL — ABNORMAL HIGH (ref 65–99)
Potassium: 3.2 mmol/L — ABNORMAL LOW (ref 3.5–5.1)
Sodium: 138 mmol/L (ref 135–145)

## 2015-04-04 LAB — CBC
HCT: 39.7 % (ref 36.0–46.0)
Hemoglobin: 12.6 g/dL (ref 12.0–15.0)
MCH: 25.7 pg — ABNORMAL LOW (ref 26.0–34.0)
MCHC: 31.7 g/dL (ref 30.0–36.0)
MCV: 81 fL (ref 78.0–100.0)
Platelets: 271 10*3/uL (ref 150–400)
RBC: 4.9 MIL/uL (ref 3.87–5.11)
RDW: 16.5 % — ABNORMAL HIGH (ref 11.5–15.5)
WBC: 13.1 10*3/uL — ABNORMAL HIGH (ref 4.0–10.5)

## 2015-04-04 LAB — GLUCOSE, CAPILLARY
Glucose-Capillary: 144 mg/dL — ABNORMAL HIGH (ref 65–99)
Glucose-Capillary: 223 mg/dL — ABNORMAL HIGH (ref 65–99)
Glucose-Capillary: 188 mg/dL — ABNORMAL HIGH (ref 65–99)
Glucose-Capillary: 250 mg/dL — ABNORMAL HIGH (ref 65–99)

## 2015-04-04 LAB — URINE CULTURE

## 2015-04-04 MED ORDER — POTASSIUM CHLORIDE CRYS ER 20 MEQ PO TBCR
40.0000 meq | EXTENDED_RELEASE_TABLET | Freq: Two times a day (BID) | ORAL | Status: AC
Start: 2015-04-04 — End: 2015-04-04
  Administered 2015-04-04 (×2): 40 meq via ORAL
  Filled 2015-04-04 (×2): qty 2

## 2015-04-04 MED ORDER — SULFAMETHOXAZOLE-TRIMETHOPRIM 800-160 MG PO TABS
1.0000 | ORAL_TABLET | Freq: Two times a day (BID) | ORAL | Status: DC
  Administered 2015-04-04 – 2015-04-05 (×3): 1 via ORAL
  Filled 2015-04-04 (×3): qty 1

## 2015-04-04 MED ORDER — CLOPIDOGREL BISULFATE 75 MG PO TABS: 75.0000 mg | ORAL_TABLET | Freq: Every day | ORAL | Status: DC

## 2015-04-04 MED ORDER — SULFAMETHOXAZOLE-TRIMETHOPRIM 800-160 MG PO TABS: 1.0000 | ORAL_TABLET | Freq: Two times a day (BID) | ORAL | Status: DC

## 2015-04-04 MED ORDER — ATORVASTATIN CALCIUM 10 MG PO TABS: 40.0000 mg | ORAL_TABLET | Freq: Every day | ORAL | Status: DC

## 2015-04-04 MED ORDER — ASPIRIN 81 MG PO TBEC: 81.0000 mg | DELAYED_RELEASE_TABLET | Freq: Every day | ORAL | Status: DC

## 2015-04-04 NOTE — Progress Notes (Signed)
[facility] PROGRESS NOTE    Progress Note   Casey MedianKay F Vey JXB:[id number] DOB: 07/24/1934 DOA: 03/31/2015 PCP: Kristian CoveyBURCHETTE,BRUCE W, MD   Brief Narrative:   Casey Harrell is an 80 y.o. female past medical history of diabetes hypertension and COPD is brought in by the family because of confusion as soon as she woke up in the morning which progressively got worse throughout the day with agitation.  Assessment/Plan:   Acute ischemic stroke [facility](HCC): Cont statins. Apprciated Neurology assistance. Allow permissive Hypertension, Blood pressure has remained between 140 and 160 systolic PT consult, Recommended CIR, Awaitting CIR recommendations. She will need aspirin 325 and Plavix for 3 months Then 81 mg aspirin and Plavix and 5 mg daily for secondary stroke prevention. She will need a loop recorder as an outpatient to rule out atrial fibrillation.  Acute encephalopathy: Unclear etiology of why she is confused, it is now improved. She doesn't have a past medical history of dementia. Now resolved.  Fever in adult: No fever since admission. UC positive for klebsiella On Rocephin daily change to Bactrim for a total of 5 days.  Type 2 diabetes mellitus, uncontrolled (HCC): Cont long acting insulin 30 units twice today continue sliding-scale insulin poorly controlled diabetes mellitus with hemoglobin A1c of 9. BG has improved on current regimen.  Sleep apnea: She cannot tolerate C-pap   Acute respiratory failure with hypoxia (HCC) - Unclear etiology now resolved. - Question if due to Haldol as it was a single episode.  Depression: Continue current home regimen.   DVT Prophylaxis - Lovenox ordered.  Family Communication: husband Disposition Plan: Home in 2 days Code Status:     Code Status Orders        Start     Ordered   03/31/15 2048  Full code   Continuous     03/31/15 2047        IV Access:    Peripheral IV   Procedures and diagnostic studies:   Ct Head  Wo Contrast  04/02/2015  CLINICAL DATA:  Woke up this morning with confusion, agitation, getting worse, left arm weakness EXAM: CT HEAD WITHOUT CONTRAST TECHNIQUE: Contiguous axial images were obtained from the base of the skull through the vertex without intravenous contrast. COMPARISON:  Multiple prior studies including 03/31/2015 CT scan FINDINGS: Encephalomalacia in the bilateral occipital lobes is stable. There is severe diffuse low attenuation in the deep white matter. There is diffuse cortical atrophy. There is no hemorrhage or extra-axial fluid. There is no mass or hydrocephalus. Again identified is an air-fluid level in the right maxillary sinus with inflammatory change right sphenoid sinus, left sphenoid sinus, and left maxillary sinus. The calvarium is intact. IMPRESSION: Bilateral remote occipital lobe infarcts with chronic involutional change. Known small patchy acute infarcts left hemisphere seen on MRI performed 04/01/2015 not well appreciated on CT. Electronically Signed   By: Esperanza Heiraymond  Rubner M.D.   On: 04/02/2015 17:01   Mr Brain Wo Contrast  04/03/2015  CLINICAL DATA:  Initial evaluation for mental status change, increased right-sided weakness. Hypotensive episode. EXAM: MRI HEAD WITHOUT CONTRAST TECHNIQUE: Multiplanar, multiecho pulse sequences of the brain and surrounding structures were obtained without intravenous contrast. COMPARISON:  Previous CT from earlier the same day as well as prior MRI from 04/01/2015. FINDINGS: There has been slight interval extension of previously seen patchy ischemic infarcts involving the left periatrial white matter and left temporal parietal region, with multiple new scattered ischemic infarcts involving the cortical gray matter and deep white matter  of the left parietal lobe (series 3, image 28, 30) as well as the cortical and subcortical white matter of the left occipital lobe (series 3, image 24). New ischemic changes within the periatrial white matter  (series 3, image 20). Ischemic change within the posterior left corona radiata also now more prominent (series 3, image 33). Tiny new infarct within the deep white matter of the anterior left centrum semi ovale (series 3, image 37). No hemorrhage or mass effect. Intravascular flow voids are grossly maintained. Atrophy with chronic small vessel ischemic disease again noted. Remote bilateral occipital infarcts, left larger than right. Scattered remote lacunar infarcts within the centrum semi ovales, basal ganglia, and thalami. Few chronic micro hemorrhages again noted. No mass lesion, midline shift, or mass effect. No hydrocephalus. No extra-axial fluid collection. Craniocervical junction normal.  Pituitary gland normal. No acute abnormality about the orbits. Mucosal thickening within the left maxillary sinus with air-fluid level in the right maxillary sinus. Mild opacity within the right sphenoid sinus. These changes are stable. Small right mastoid effusion, also stable. Bone marrow signal intensity within normal limits. Scalp soft tissues demonstrate no acute abnormality. IMPRESSION: 1. Interval extension in the recently identified left periatrial and temporoparietal infarcts, with multiple scattered new ischemic infarcts involving the left temporoparietal region and left occipital lobe. This most likely resulted from recent hypotensive episode in the setting of known high-grade stenoses in the left M1 and/or P2 segments as seen on recent MRA. 2. Otherwise stable appearance of the brain. Electronically Signed   By: Rise Mu M.D.   On: 04/03/2015 00:45     Medical Consultants:    None.  Anti-Infectives:   Anti-infectives    Start     Dose/Rate Route Frequency Ordered Stop   04/02/15 1700  cefTRIAXone (ROCEPHIN) 1 g in dextrose 5 % 50 mL IVPB     1 g 100 mL/hr over 30 Minutes Intravenous Every 24 hours 04/02/15 1539        Subjective:    Casey Harrell She will a left upper extremity is  much improved.  Objective:    Filed Vitals:   04/03/15 2146 04/04/15 0129 04/04/15 0531 04/04/15 0948  BP: 180/70 181/68 178/72 160/70  Pulse: 67 63 65 82  Temp: 98.6 F (37 C) 98.3 F (36.8 C) 98.3 F (36.8 C) 97.8 F (36.6 C)  TempSrc: Oral Oral Oral Oral  Resp: Height:      Weight:      SpO2: 96% 98% 96% 98%   No intake or output data in the 24 hours ending 04/04/15 1042 Filed Weights   03/31/15 2041  Weight: 65.4 kg (144 lb 2.9 oz)    Exam: Gen:  NAD Cardiovascular:  RRR, No M/R/G Chest and lungs:   CTAB Abdomen:  Abdomen soft, NT/ND, + BS Extremities:  No C/E/C Neuro: She is awake alert and oriented 3, Left upper extremity strength 5/5,slightly improved compared to yesterday.   Data Reviewed:    Labs: Basic Metabolic Panel:  Recent Labs Lab 03/31/15 1419 04/02/15 0700 04/03/15 0333 04/04/15 0258  NA 140 137 139 138  K 4.1 3.8 3.8 3.2*  CL 103 103 110 104  CO2 GLUCOSE 199* 135* 115* 148*  BUN 22* 31* 21* 15  CREATININE 1.02* 1.16* 0.88 0.65  CALCIUM 9.5 9.5 8.6* 9.7   GFR Estimated Creatinine Clearance: 52.2 mL/min (by C-G formula based on Cr of 0.65). Liver Function Tests:  Recent  Labs Lab 03/31/15 1419  AST 34  ALT 31  ALKPHOS 84  BILITOT 0.6  PROT 8.3*  ALBUMIN 4.5    Recent Labs Lab 03/31/15 1419  LIPASE 19    Recent Labs Lab 04/02/15 0700  AMMONIA 20   Coagulation profile No results for input(s): INR, PROTIME in the last 168 hours.  CBC:  Recent Labs Lab 03/31/15 1419 04/02/15 0700 04/03/15 0333 04/04/15 0258  WBC 12.0* 10.1 12.1* 13.1*  HGB 13.9 12.6 11.2* 12.6  HCT 43.2 40.5 36.0 39.7  MCV 80.0 81.3 81.8 81.0  PLT 300 271 256 271   Cardiac Enzymes: No results for input(s): CKTOTAL, CKMB, CKMBINDEX, TROPONINI in the last 168 hours. BNP (last 3 results) No results for input(s): PROBNP in the last 8760 hours. CBG:  Recent Labs Lab 04/03/15 0627 04/03/15 1102 04/03/15 1626  04/03/15 2146 04/04/15 0620  GLUCAP 104* 145* 163* 201* 144*   D-Dimer: No results for input(s): DDIMER in the last 72 hours. Hgb A1c: No results for input(s): HGBA1C in the last 72 hours. Lipid Profile: No results for input(s): CHOL, HDL, LDLCALC, TRIG, CHOLHDL, LDLDIRECT in the last 72 hours. Thyroid function studies: No results for input(s): TSH, T4TOTAL, T3FREE, THYROIDAB in the last 72 hours.  Invalid input(s): FREET3 Anemia work up: No results for input(s): VITAMINB12, FOLATE, FERRITIN, TIBC, IRON, RETICCTPCT in the last 72 hours. Sepsis Labs:  Recent Labs Lab 03/31/15 1419 03/31/15 1542 04/02/15 0700 04/03/15 0333 04/04/15 0258  WBC 12.0*  --  10.1 12.1* 13.1*  LATICACIDVEN  --  2.04*  --   --   --    Microbiology Recent Results (from the past 240 hour(s))  Urine culture     Status: None   Collection Time: 03/31/15  3:27 PM  Result Value Ref Range Status   Specimen Description URINE, CATHETERIZED  Final   Special Requests Normal  Final   Culture   Final    >=100,000 COLONIES/mL KLEBSIELLA PNEUMONIAE Performed at [facility]    Report Status 04/02/2015 FINAL  Final   Organism ID, Bacteria KLEBSIELLA PNEUMONIAE  Final      Susceptibility   Klebsiella pneumoniae - MIC*    AMPICILLIN 16 RESISTANT Resistant     CEFAZOLIN <=4 SENSITIVE Sensitive     CEFTRIAXONE <=1 SENSITIVE Sensitive     CIPROFLOXACIN <=0.25 SENSITIVE Sensitive     GENTAMICIN <=1 SENSITIVE Sensitive     IMIPENEM <=0.25 SENSITIVE Sensitive     NITROFURANTOIN <=16 SENSITIVE Sensitive     TRIMETH/SULFA <=20 SENSITIVE Sensitive     AMPICILLIN/SULBACTAM 4 SENSITIVE Sensitive     PIP/TAZO <=4 SENSITIVE Sensitive     * >=100,000 COLONIES/mL KLEBSIELLA PNEUMONIAE  Culture, Urine     Status: None   Collection Time: 04/02/15 10:56 PM  Result Value Ref Range Status   Specimen Description URINE, RANDOM  Final   Special Requests NONE  Final   Culture MULTIPLE SPECIES PRESENT, SUGGEST  RECOLLECTION  Final   Report Status 04/04/2015 FINAL  Final     Medications:   . aspirin EC  81 mg Oral Daily  . atorvastatin  10 mg Oral q1800  . cefTRIAXone (ROCEPHIN)  IV  1 g Intravenous Q24H  . clopidogrel  75 mg Oral Daily  . DULoxetine  60 mg Oral Daily  . enoxaparin (LOVENOX) injection  40 mg Subcutaneous Q24H  . hydrocortisone  25 mg Rectal BID  . insulin aspart  0-5 Units Subcutaneous QHS  . insulin aspart  0-9  Units Subcutaneous TID WC  . insulin detemir  30 Units Subcutaneous BID  . levothyroxine  50 mcg Oral QAC breakfast  . pantoprazole  40 mg Oral Daily  . pregabalin  150 mg Oral Daily   Continuous Infusions: . sodium chloride 125 mL/hr at 04/02/15 1755    Time spent: 15 min   LOS: 4 days   Marinda Elk  [facility] Pager [phone]  *Please refer to [email or web address], password [email or web address] to get updated schedule on who will round on this patient, as hospitalists switch teams weekly. If 7PM-7AM, please contact night-coverage at [email or web address], password [email or web address] for any overnight needs.  04/04/2015, 10:42 AM

## 2015-04-04 NOTE — Discharge Summary (Addendum)
Physician Discharge Summary  Casey MedianKay F Harrell ONG:[id number]:[id number] DOB: 1934/06/12 DOA: 03/31/2015  PCP: Kristian CoveyBURCHETTE,BRUCE W, MD  Admit date: 03/31/2015 Discharge date: 04/05/2015  Time spent: 35 minutes  Recommendations for Outpatient Follow-up:  1. Follow-up neurology and to 4 weeks. 2. Physical therapy evaluated the patient recommended inpatient rehabilitation 3. Will have to allow permissive HTN   Discharge Diagnoses:  Active Problems:   Type 2 diabetes mellitus, uncontrolled (HCC)   Sleep apnea- C-pap intol   Acute respiratory failure with hypoxia (HCC)   Acute ischemic stroke (HCC)   Hypertensive urgency   Depression   Fever in adult   Acute encephalopathy   Confusion   CVA (cerebral infarction)   Posterior circulation stroke (HCC)   Orthostatic hypotension   Discharge Condition: Stable  Diet recommendation: Carb modified  Filed Weights   03/31/15 2041  Weight: 65.4 kg (144 lb 2.9 oz)    History of present illness:  80 year old with past medical history of diabetes mellitus hypertension and COPD has brought in by the family due to confusion that started on the day of admission which has progressively got worse with agitation and aggression.  Hospital Course:  Acute ischemic stroke: Hemoglobin A1c was 9.0 Lipid less than 40 LDL was 76 her statin was increased. An MRI of the brain was done that showed left parietal infarct and remote occipital infarct.  PT evaluated the patient and recommended inpatient rehabilitation. A 2-D echo was done that showed no source of emboli with an EF of 55% and grade 1 diastolic heart failure. A carotid Doppler showed less than 40% stenosis bilaterally and ICAs Neurology was consulted and recommended she will need aspirin 325 mg and Plavix for 3 months then she will continue aspirin 81 mg and Plavix daily indefinitely. She was monitoring on telemetry with no cardiac events. Result of EEG EEG is abnormal with mild generalized nonspecific  continuous slowing of cerebral activity. Goal BP BP goal 130-160.  Acute encephalopathy: Question is do to a combination of acute stroke and possibly UTI. After treatment of UTI and allowing permissive HTN.  UTI: Urine culture was done that was positive for Klebsiella she was started on Rocephin once sensitivities came back she was changed to oral Bactrim we she will continue for 4 additional days.  Diabetes mellitus type 2: Her last A1c was 9.0 she was continue on Levemir twice a day plus sliding scale insulin. Will continue further titration of insulin as an outpatient.  Obstructive sleep apnea: Patient cannot tolerate Cipro.  Acute respiratory failure with hypoxia: This happened in the ED after Haldol was given, question if due to Haldol. She had no further events of hypoxia throughout her hospital stay.  Procedures:  CT scan of the abdomen and pelvis  CT scan of the head  Chest x-ray  MRI of the brain  MRA of the head  A 2-D echo  Carotid Doppler    Consultations:  Neurology  Discharge Exam: Filed Vitals:   04/05/15 0612 04/05/15 0615  BP: 192/81 171/99  Pulse: 72   Temp: 98.1 F (36.7 C)   Resp:      General: A&O x3 Cardiovascular: RRR Respiratory: good air movement CTA B/L  Discharge Instructions   Discharge Instructions    Ambulatory referral to Neurology    Complete by:  As directed   Pt [facility]. Thanks.          Current Discharge Medication List  START taking these medications   Details  aspirin EC 81 MG EC tablet Take 1 tablet (81 mg total) by mouth daily.    atorvastatin (LIPITOR) 10 MG tablet Take 4 tablets (40 mg total) by mouth daily at 6 PM. Qty: 30 tablet, Refills: 0    clopidogrel (PLAVIX) 75 MG tablet Take 1 tablet (75 mg total) by mouth daily. Qty: 30 tablet, Refills: 0    sulfamethoxazole-trimethoprim (BACTRIM DS,SEPTRA DS) 800-160 MG tablet Take 1 tablet by mouth every 12  (twelve) hours.      CONTINUE these medications which have NOT CHANGED   Details  amitriptyline (ELAVIL) 25 MG tablet Take 1 tablet (25 mg total) by mouth at bedtime. Qty: 90 tablet, Refills: 1    AZELASTINE HCL OP Apply to eye.    canagliflozin (INVOKANA) 300 MG TABS tablet Take 300 mg by mouth daily before breakfast. Qty: 30 tablet, Refills: 11    carvedilol (COREG) 25 MG tablet Take 1 tablet (25 mg total) by mouth 2 (two) times daily with a meal. Qty: 180 tablet, Refills: 1    fluticasone (VERAMYST) 27.5 MCG/SPRAY nasal spray Place 2 sprays into the nose daily.    hydrocortisone (ANUSOL-HC) 25 MG suppository Place 1 suppository (25 mg total) rectally 2 (two) times daily. Qty: 24 suppository, Refills: 0    Insulin Detemir (LEVEMIR FLEXTOUCH) 100 UNIT/ML Pen Inject 40 Units into the skin 2 (two) times daily. Qty: 15 mL, Refills: 5    insulin lispro (HUMALOG) 100 UNIT/ML KiwkPen INJECT 20 UNITS AT BREAKFAST, 10 UNITS AT LUNCH, AND 20 UNITS WITH SUPPER. Qty: 15 pen, Refills: 5    lansoprazole (PREVACID) 15 MG capsule Take 15 mg by mouth as needed (acid reflux).    levothyroxine (SYNTHROID, LEVOTHROID) 50 MCG tablet Take 1 tablet (50 mcg total) by mouth daily before breakfast. Qty: 90 tablet, Refills: 0    meclizine (ANTIVERT) 12.5 MG tablet Take 1 tablet (12.5 mg total) by mouth every 6 (six) hours as needed for dizziness. Qty: 30 tablet, Refills: 0    metFORMIN (GLUCOPHAGE) 1000 MG tablet Take 1 tablet (1,000 mg total) by mouth 2 (two) times daily with a meal. Qty: 180 tablet, Refills: 3    ondansetron (ZOFRAN ODT) 4 MG disintegrating tablet Take 1 tablet (4 mg total) by mouth every 8 (eight) hours as needed for nausea or vomiting. Qty: 10 tablet, Refills: 0    albuterol (ACCUNEB) 1.25 MG/3ML nebulizer solution Take 1 ampule by nebulization every 6 (six) hours as needed for wheezing.    aspirin 81 MG tablet Take 81 mg by mouth daily.      B-D ULTRAFINE III SHORT PEN 31G X  8 MM MISC USE AS DIRECTED Qty: 100 each, Refills: 1    Calcium Citrate (CITRACAL PO) Take 1 tablet by mouth 2 (two) times daily.     Cholecalciferol (VITAMIN D3) 2000 UNITS TABS Take 2,000 Units by mouth daily.     DULoxetine (CYMBALTA) 60 MG capsule TAKE 1 CAPSULE BY MOUTH DAILY Qty: 90 capsule, Refills: 1    Lancets MISC     magnesium oxide (MAG-OX) 400 (241.3 MG) MG tablet TAKE 1 TABLET BY MOUTH DAILY Qty: 30 tablet, Refills: 3    NOVOLOG FLEXPEN 100 UNIT/ML FlexPen INJECT 9 TO 14 UNITS UNDER THE SKIN 2 TIMES DAILY WITH A MEAL 9 UNITS BEFORE BREAKFAST AND 14 UNITS AFTER SUPPER Qty: 100 mL, Refills: 0    pantoprazole (PROTONIX) 40 MG tablet TAKE 1 TABLET BY MOUTH TWICE DAILY  Qty: 180 tablet, Refills: 3    pregabalin (LYRICA) 150 MG capsule TAKE ONE CAPSULE BY MOUTH DAILY Qty: 90 capsule, Refills: 1      STOP taking these medications     simvastatin (ZOCOR) 20 MG tablet      valsartan (DIOVAN) 160 MG tablet      CLEVER CHEK AUTO-CODE VOICE test strip      diltiazem (CARTIA XT) 120 MG 24 hr capsule      promethazine (PHENERGAN) 12.5 MG tablet        Allergies  Allergen Reactions  . Doxycycline Hyclate Nausea And Vomiting    REACTION: vomiting  . Iohexol      Code: HIVES, Desc: pt. states she breaks out in hives 06/15/08   . Morphine Sulfate     REACTION: hallucinations  . Moxifloxacin     REACTION: unknown   Follow-up Information    Follow up with Xu,Jindong, MD. Schedule an appointment as soon as possible for a visit in 2 months.   Specialty:  Neurology   Why:  stroke clinic   Contact information:   [address] [place] [address] [phone]        The results of significant diagnostics from this hospitalization (including imaging, microbiology, ancillary and laboratory) are listed below for reference.    Significant Diagnostic Studies: Ct Abdomen Pelvis Wo Contrast  03/31/2015  CLINICAL DATA:  Malaise with nausea and vomiting  today. Disorientation. Initial encounter. EXAM: CT ABDOMEN AND PELVIS WITHOUT CONTRAST TECHNIQUE: Multidetector CT imaging of the abdomen and pelvis was performed following the standard protocol without IV contrast. COMPARISON:  CT 04/09/2014 and 08/23/2009. FINDINGS: Lower chest: Mild atelectasis and vascular crowding at both lung bases. There is no confluent airspace opacity or significant pleural effusion. Atherosclerosis and a small hiatal hernia noted. Hepatobiliary: Diffusely decreased hepatic density again noted consistent with steatosis. No focal abnormalities identified on noncontrast imaging. No evidence of gallstones, gallbladder wall thickening or biliary dilatation. Pancreas: Fatty replacement and atrophy. No acute findings or surrounding inflammation. Spleen: Normal in size without focal abnormality. Adrenals/Urinary Tract: Both adrenal glands appear normal. There is no evidence of urinary tract calculus or perinephric soft tissue stranding. There is mild collecting system dilatation bilaterally, probably related to distension of the urinary bladder. No focal bladder abnormality seen. Retroperitoneal calcification on the right (image 50) appears unchanged, within the gonadal vein. Stomach/Bowel: No evidence of bowel wall thickening, distention or surrounding inflammatory change. Mild diverticular changes throughout the colon. Vascular/Lymphatic: There are no enlarged abdominal or pelvic lymph nodes. Moderate aortoiliac atherosclerosis noted. Reproductive: Hysterectomy.  No evidence of adnexal mass. Other: Possible injection changes within the subcutaneous fat of the anterior abdominal wall. Musculoskeletal: No acute or significant osseous findings. There are diffuse degenerative changes throughout the spine. IMPRESSION: 1. No acute findings or explanation for the patient's symptoms. 2. Bladder distention, likely accounting for mild intrarenal collecting system dilatation. No evidence of urinary tract  calculus or secondary signs of ureteral obstruction. 3. Hepatic steatosis, colonic diverticulosis and atherosclerosis noted. Electronically Signed   By: Carey Bullocks M.D.   On: 03/31/2015 17:43   Dg Chest 2 View  03/31/2015  CLINICAL DATA:  Shortness of breath, altered mental status EXAM: CHEST  2 VIEW COMPARISON:  11/05/2014 FINDINGS: Stable mild cardiomegaly. No focal pneumonia, collapse or consolidation. Negative for edema, effusion, or pneumothorax. Trachea midline. Monitor leads overlie the chest. No acute osseous finding. IMPRESSION: Cardiomegaly without acute process.  No interval change. Electronically Signed  By: Osvaldo Shipper M.D.   On: 03/31/2015 15:53   Ct Head Wo Contrast  04/02/2015  CLINICAL DATA:  Woke up this morning with confusion, agitation, getting worse, left arm weakness EXAM: CT HEAD WITHOUT CONTRAST TECHNIQUE: Contiguous axial images were obtained from the base of the skull through the vertex without intravenous contrast. COMPARISON:  Multiple prior studies including 03/31/2015 CT scan FINDINGS: Encephalomalacia in the bilateral occipital lobes is stable. There is severe diffuse low attenuation in the deep white matter. There is diffuse cortical atrophy. There is no hemorrhage or extra-axial fluid. There is no mass or hydrocephalus. Again identified is an air-fluid level in the right maxillary sinus with inflammatory change right sphenoid sinus, left sphenoid sinus, and left maxillary sinus. The calvarium is intact. IMPRESSION: Bilateral remote occipital lobe infarcts with chronic involutional change. Known small patchy acute infarcts left hemisphere seen on MRI performed 04/01/2015 not well appreciated on CT. Electronically Signed   By: Esperanza Heir M.D.   On: 04/02/2015 17:01   Ct Head Wo Contrast  03/31/2015  CLINICAL DATA:  Disoriented with nausea and vomiting. EXAM: CT HEAD WITHOUT CONTRAST TECHNIQUE: Contiguous axial images were obtained from the base of the skull through  the vertex without intravenous contrast. COMPARISON:  August 22, 2009 FINDINGS: There is mild diffuse atrophy. There is no intracranial mass, hemorrhage, extra-axial fluid collection, or midline shift. There is evidence of a prior infarct in the posterior inferior left occipital lobe. In comparison with the prior study, there is now decreased attenuation in the posterior medial right occipital lobe, concerning for recent infarct. Elsewhere, there is patchy small vessel disease throughout the centra semiovale bilaterally. The bony calvarium appears intact. The mastoid air cells are clear. There is an air-fluid level in the right maxillary antrum. There is mucosal thickening in the left maxillary antrum. No intraorbital lesions are appreciable. IMPRESSION: Findings concerning for recent/ possibly acute infarct in the medial posterior right occipital lobe. Prior infarct posterior left occipital lobe. There is atrophy with patchy small vessel disease in the periventricular white matter bilaterally. No hemorrhage or mass effect. Sinusitis with air-fluid level in the right maxillary antrum. Electronically Signed   By: Bretta Bang III M.D.   On: 03/31/2015 16:44   Mr Brain Wo Contrast  04/03/2015  CLINICAL DATA:  Initial evaluation for mental status change, increased right-sided weakness. Hypotensive episode. EXAM: MRI HEAD WITHOUT CONTRAST TECHNIQUE: Multiplanar, multiecho pulse sequences of the brain and surrounding structures were obtained without intravenous contrast. COMPARISON:  Previous CT from earlier the same day as well as prior MRI from 04/01/2015. FINDINGS: There has been slight interval extension of previously seen patchy ischemic infarcts involving the left periatrial white matter and left temporal parietal region, with multiple new scattered ischemic infarcts involving the cortical gray matter and deep white matter of the left parietal lobe (series 3, image 28, 30) as well as the cortical and  subcortical white matter of the left occipital lobe (series 3, image 24). New ischemic changes within the periatrial white matter (series 3, image 20). Ischemic change within the posterior left corona radiata also now more prominent (series 3, image 33). Tiny new infarct within the deep white matter of the anterior left centrum semi ovale (series 3, image 37). No hemorrhage or mass effect. Intravascular flow voids are grossly maintained. Atrophy with chronic small vessel ischemic disease again noted. Remote bilateral occipital infarcts, left larger than right. Scattered remote lacunar infarcts within the centrum semi ovales, basal ganglia, and thalami.  Few chronic micro hemorrhages again noted. No mass lesion, midline shift, or mass effect. No hydrocephalus. No extra-axial fluid collection. Craniocervical junction normal.  Pituitary gland normal. No acute abnormality about the orbits. Mucosal thickening within the left maxillary sinus with air-fluid level in the right maxillary sinus. Mild opacity within the right sphenoid sinus. These changes are stable. Small right mastoid effusion, also stable. Bone marrow signal intensity within normal limits. Scalp soft tissues demonstrate no acute abnormality. IMPRESSION: 1. Interval extension in the recently identified left periatrial and temporoparietal infarcts, with multiple scattered new ischemic infarcts involving the left temporoparietal region and left occipital lobe. This most likely resulted from recent hypotensive episode in the setting of known high-grade stenoses in the left M1 and/or P2 segments as seen on recent MRA. 2. Otherwise stable appearance of the brain. Electronically Signed   By: Rise Mu M.D.   On: 04/03/2015 00:45   Mr Brain Wo Contrast  04/01/2015  CLINICAL DATA:  Initial evaluation for acute altered mental status, vertigo. EXAM: MRI HEAD WITHOUT CONTRAST MRA HEAD WITHOUT CONTRAST TECHNIQUE: Multiplanar, multiecho pulse sequences of  the brain and surrounding structures were obtained without intravenous contrast. Angiographic images of the head were obtained using MRA technique without contrast. COMPARISON:  Prior CT from 03/31/2015. FINDINGS: MRI HEAD FINDINGS Study is severely limited by motion artifact. Diffuse prominence of the CSF containing spaces is compatible with generalized cerebral atrophy. Patchy T2/FLAIR hyperintensity within the periventricular and deep white matter of both cerebral hemispheres is present, consistent with moderate chronic small vessel ischemic disease. This appears grossly similar relative to previous MRI from 2011. Superimposed lacunar infarct within the deep white matter of the anterior right centrum semi ovale. Additional scattered remote lacunar infarcts within the bilateral thalami and basal ganglia. Encephalomalacia present within the bilateral occipital lobes, which may be related to remote ischemia. 9 mm acute ischemic infarct within the left periatrial white matter (series 6, image 23). Tiny infarct within the overlying cortical gray matter of the posterior left temporal occipital region. Few additional tiny ischemic infarcts within the deep white matter of the left parietal lobe (series 6, image 30, 28). No associated hemorrhage or mass effect. No other infarct. Few small chronic micro hemorrhages noted within the supratentorial and infratentorial brain, most prominent within the left paramedian pons (series 15, image 7), likely related to chronic underlying hypertension. No mass lesion, midline shift, or mass effect. No hydrocephalus. No extra-axial fluid collection. Craniocervical junction normal.  Pituitary gland normal. No acute abnormality about the orbits. Sequela prior bilateral lens extraction noted. Air-fluid level within the right maxillary sinus. Scattered opacity within the right sphenoid sinus. Small right mastoid effusion. Bone marrow signal intensity within normal limits. Scalp soft tissues  demonstrate no acute abnormality. MRA HEAD FINDINGS ANTERIOR CIRCULATION: Visualized distal cervical segments of the internal carotid arteries are patent with antegrade flow. Petrous, cavernous, and supraclinoid segments are patent without flow-limiting stenosis. ICA termini patent. Left A1 segment well opacified. Right A1 segment hypoplastic. Anterior communicating artery and anterior cerebral arteries well opacified. Right M1 segment patent without stenosis or occlusion. Right MCA bifurcation normal. Distal right MCA branches opacified. Focal severe high-grade stenosis at the proximal left M1 segment. M1 segment is opacified distally. MCA bifurcation normal on the left. Left MCA branches are fairly symmetric with the right and are well opacified. POSTERIOR CIRCULATION: Right vertebral artery dominant. Right vertebral artery demonstrates multi focal atheromatous irregularity without flow-limiting stenosis. There is a focal severe stenosis within the diminutive left  V4 segment. Right PICA is patent. Left posterior inferior cerebral artery not visualized. Basilar artery widely patent. Superior cerebellar arteries opacified bilaterally. Short-segment severe stenosis at the proximal right superior cerebellar artery. Fetal origin of the right PCA with a small right posterior communicating artery. Atheromatous irregularity throughout the right PCA. Atheromatous irregularity with multifocal stenoses throughout the left PCA, with a fairly severe stenosis within the mid left P2 segment. No aneurysm. IMPRESSION: MRI HEAD IMPRESSION: 1. Small patchy acute ischemic infarcts involving the left periatrial white matter and left temporoparietal region. 2. Remote bilateral occipital infarcts, left greater than right, with scattered remote lacunar infarcts as above. 3. Cerebral atrophy with moderate chronic small vessel ischemic disease. MRA HEAD IMPRESSION: 1. No large or proximal arterial branch occlusion. 2. Focal high-grade  stenosis within the proximal left M1 segment. 3. Extensive atheromatous throughout the posterior circulation, with focal severe stenoses within the left V4 segment, proximal right SCA, and mid left P2 segment. Electronically Signed   By: Rise Mu M.D.   On: 04/01/2015 06:34   Mr Maxine Glenn Head/brain Wo Cm  04/01/2015  CLINICAL DATA:  Initial evaluation for acute altered mental status, vertigo. EXAM: MRI HEAD WITHOUT CONTRAST MRA HEAD WITHOUT CONTRAST TECHNIQUE: Multiplanar, multiecho pulse sequences of the brain and surrounding structures were obtained without intravenous contrast. Angiographic images of the head were obtained using MRA technique without contrast. COMPARISON:  Prior CT from 03/31/2015. FINDINGS: MRI HEAD FINDINGS Study is severely limited by motion artifact. Diffuse prominence of the CSF containing spaces is compatible with generalized cerebral atrophy. Patchy T2/FLAIR hyperintensity within the periventricular and deep white matter of both cerebral hemispheres is present, consistent with moderate chronic small vessel ischemic disease. This appears grossly similar relative to previous MRI from 2011. Superimposed lacunar infarct within the deep white matter of the anterior right centrum semi ovale. Additional scattered remote lacunar infarcts within the bilateral thalami and basal ganglia. Encephalomalacia present within the bilateral occipital lobes, which may be related to remote ischemia. 9 mm acute ischemic infarct within the left periatrial white matter (series 6, image 23). Tiny infarct within the overlying cortical gray matter of the posterior left temporal occipital region. Few additional tiny ischemic infarcts within the deep white matter of the left parietal lobe (series 6, image 30, 28). No associated hemorrhage or mass effect. No other infarct. Few small chronic micro hemorrhages noted within the supratentorial and infratentorial brain, most prominent within the left paramedian  pons (series 15, image 7), likely related to chronic underlying hypertension. No mass lesion, midline shift, or mass effect. No hydrocephalus. No extra-axial fluid collection. Craniocervical junction normal.  Pituitary gland normal. No acute abnormality about the orbits. Sequela prior bilateral lens extraction noted. Air-fluid level within the right maxillary sinus. Scattered opacity within the right sphenoid sinus. Small right mastoid effusion. Bone marrow signal intensity within normal limits. Scalp soft tissues demonstrate no acute abnormality. MRA HEAD FINDINGS ANTERIOR CIRCULATION: Visualized distal cervical segments of the internal carotid arteries are patent with antegrade flow. Petrous, cavernous, and supraclinoid segments are patent without flow-limiting stenosis. ICA termini patent. Left A1 segment well opacified. Right A1 segment hypoplastic. Anterior communicating artery and anterior cerebral arteries well opacified. Right M1 segment patent without stenosis or occlusion. Right MCA bifurcation normal. Distal right MCA branches opacified. Focal severe high-grade stenosis at the proximal left M1 segment. M1 segment is opacified distally. MCA bifurcation normal on the left. Left MCA branches are fairly symmetric with the right and are well opacified. POSTERIOR CIRCULATION: Right  vertebral artery dominant. Right vertebral artery demonstrates multi focal atheromatous irregularity without flow-limiting stenosis. There is a focal severe stenosis within the diminutive left V4 segment. Right PICA is patent. Left posterior inferior cerebral artery not visualized. Basilar artery widely patent. Superior cerebellar arteries opacified bilaterally. Short-segment severe stenosis at the proximal right superior cerebellar artery. Fetal origin of the right PCA with a small right posterior communicating artery. Atheromatous irregularity throughout the right PCA. Atheromatous irregularity with multifocal stenoses throughout  the left PCA, with a fairly severe stenosis within the mid left P2 segment. No aneurysm. IMPRESSION: MRI HEAD IMPRESSION: 1. Small patchy acute ischemic infarcts involving the left periatrial white matter and left temporoparietal region. 2. Remote bilateral occipital infarcts, left greater than right, with scattered remote lacunar infarcts as above. 3. Cerebral atrophy with moderate chronic small vessel ischemic disease. MRA HEAD IMPRESSION: 1. No large or proximal arterial branch occlusion. 2. Focal high-grade stenosis within the proximal left M1 segment. 3. Extensive atheromatous throughout the posterior circulation, with focal severe stenoses within the left V4 segment, proximal right SCA, and mid left P2 segment. Electronically Signed   By: Rise Mu M.D.   On: 04/01/2015 06:34    Microbiology: Recent Results (from the past 240 hour(s))  Urine culture     Status: None   Collection Time: 03/31/15  3:27 PM  Result Value Ref Range Status   Specimen Description URINE, CATHETERIZED  Final   Special Requests Normal  Final   Culture   Final    >=100,000 COLONIES/mL KLEBSIELLA PNEUMONIAE Performed at [facility]    Report Status 04/02/2015 FINAL  Final   Organism ID, Bacteria KLEBSIELLA PNEUMONIAE  Final      Susceptibility   Klebsiella pneumoniae - MIC*    AMPICILLIN 16 RESISTANT Resistant     CEFAZOLIN <=4 SENSITIVE Sensitive     CEFTRIAXONE <=1 SENSITIVE Sensitive     CIPROFLOXACIN <=0.25 SENSITIVE Sensitive     GENTAMICIN <=1 SENSITIVE Sensitive     IMIPENEM <=0.25 SENSITIVE Sensitive     NITROFURANTOIN <=16 SENSITIVE Sensitive     TRIMETH/SULFA <=20 SENSITIVE Sensitive     AMPICILLIN/SULBACTAM 4 SENSITIVE Sensitive     PIP/TAZO <=4 SENSITIVE Sensitive     * >=100,000 COLONIES/mL KLEBSIELLA PNEUMONIAE  Culture, Urine     Status: None   Collection Time: 04/02/15 10:56 PM  Result Value Ref Range Status   Specimen Description URINE, RANDOM  Final   Special Requests  NONE  Final   Culture MULTIPLE SPECIES PRESENT, SUGGEST RECOLLECTION  Final   Report Status 04/04/2015 FINAL  Final     Labs: Basic Metabolic Panel:  Recent Labs Lab 03/31/15 1419 04/02/15 0700 04/03/15 0333 04/04/15 0258  NA 140 137 139 138  K 4.1 3.8 3.8 3.2*  CL 103 103 110 104  CO2 GLUCOSE 199* 135* 115* 148*  BUN 22* 31* 21* 15  CREATININE 1.02* 1.16* 0.88 0.65  CALCIUM 9.5 9.5 8.6* 9.7   Liver Function Tests:  Recent Labs Lab 03/31/15 1419  AST 34  ALT 31  ALKPHOS 84  BILITOT 0.6  PROT 8.3*  ALBUMIN 4.5    Recent Labs Lab 03/31/15 1419  LIPASE 19    Recent Labs Lab 04/02/15 0700  AMMONIA 20   CBC:  Recent Labs Lab 03/31/15 1419 04/02/15 0700 04/03/15 0333 04/04/15 0258  WBC 12.0* 10.1 12.1* 13.1*  HGB 13.9 12.6 11.2* 12.6  HCT 43.2 40.5 36.0 39.7  MCV 80.0 81.3  81.8 81.0  PLT 300 271 256 271   Cardiac Enzymes: No results for input(s): CKTOTAL, CKMB, CKMBINDEX, TROPONINI in the last 168 hours. BNP: BNP (last 3 results) No results for input(s): BNP in the last 8760 hours.  ProBNP (last 3 results) No results for input(s): PROBNP in the last 8760 hours.  CBG:  Recent Labs Lab 04/04/15 0620 04/04/15 1107 04/04/15 1624 04/04/15 2059 04/05/15 0705  GLUCAP 144* 223* 188* 250* 147*       Signed:  Marinda Elk  [facility] 04/05/2015, 9:44 AM

## 2015-04-05 ENCOUNTER — Encounter (HOSPITAL_COMMUNITY): Payer: Self-pay | Admitting: Physical Medicine and Rehabilitation

## 2015-04-05 ENCOUNTER — Inpatient Hospital Stay (HOSPITAL_COMMUNITY)
Admission: RE | Admit: 2015-04-05 | Discharge: 2015-04-20 | DRG: 057 | Disposition: A | Discharge status: Home Health | Payer: Medicare Other | Source: Intra-hospital | Attending: Physical Medicine & Rehabilitation | Admitting: Physical Medicine & Rehabilitation

## 2015-04-05 DIAGNOSIS — B961 Klebsiella pneumoniae [K. pneumoniae] as the cause of diseases classified elsewhere: Secondary | ICD-10-CM | POA: Diagnosis not present

## 2015-04-05 DIAGNOSIS — E1100 Type 2 diabetes mellitus with hyperosmolarity without nonketotic hyperglycemic-hyperosmolar coma (NKHHC): Secondary | ICD-10-CM | POA: Diagnosis not present

## 2015-04-05 DIAGNOSIS — N390 Urinary tract infection, site not specified: Secondary | ICD-10-CM

## 2015-04-05 DIAGNOSIS — G4733 Obstructive sleep apnea (adult) (pediatric): Secondary | ICD-10-CM | POA: Diagnosis not present

## 2015-04-05 DIAGNOSIS — Z7982 Long term (current) use of aspirin: Secondary | ICD-10-CM

## 2015-04-05 DIAGNOSIS — I63432 Cerebral infarction due to embolism of left posterior cerebral artery: Secondary | ICD-10-CM

## 2015-04-05 DIAGNOSIS — F329 Major depressive disorder, single episode, unspecified: Secondary | ICD-10-CM

## 2015-04-05 DIAGNOSIS — I69398 Other sequelae of cerebral infarction: Secondary | ICD-10-CM

## 2015-04-05 DIAGNOSIS — E785 Hyperlipidemia, unspecified: Secondary | ICD-10-CM | POA: Diagnosis present

## 2015-04-05 DIAGNOSIS — H53461 Homonymous bilateral field defects, right side: Secondary | ICD-10-CM | POA: Diagnosis not present

## 2015-04-05 DIAGNOSIS — D72829 Elevated white blood cell count, unspecified: Secondary | ICD-10-CM

## 2015-04-05 DIAGNOSIS — E039 Hypothyroidism, unspecified: Secondary | ICD-10-CM

## 2015-04-05 DIAGNOSIS — K219 Gastro-esophageal reflux disease without esophagitis: Secondary | ICD-10-CM | POA: Diagnosis not present

## 2015-04-05 DIAGNOSIS — G4730 Sleep apnea, unspecified: Secondary | ICD-10-CM

## 2015-04-05 DIAGNOSIS — T380X5A Adverse effect of glucocorticoids and synthetic analogues, initial encounter: Secondary | ICD-10-CM

## 2015-04-05 DIAGNOSIS — G8191 Hemiplegia, unspecified affecting right dominant side: Secondary | ICD-10-CM | POA: Diagnosis not present

## 2015-04-05 DIAGNOSIS — I739 Peripheral vascular disease, unspecified: Secondary | ICD-10-CM | POA: Diagnosis not present

## 2015-04-05 DIAGNOSIS — G8190 Hemiplegia, unspecified affecting unspecified side: Secondary | ICD-10-CM | POA: Diagnosis not present

## 2015-04-05 DIAGNOSIS — I69319 Unspecified symptoms and signs involving cognitive functions following cerebral infarction: Secondary | ICD-10-CM | POA: Diagnosis not present

## 2015-04-05 DIAGNOSIS — Z794 Long term (current) use of insulin: Secondary | ICD-10-CM | POA: Diagnosis not present

## 2015-04-05 DIAGNOSIS — R05 Cough: Secondary | ICD-10-CM

## 2015-04-05 DIAGNOSIS — E1142 Type 2 diabetes mellitus with diabetic polyneuropathy: Secondary | ICD-10-CM | POA: Diagnosis not present

## 2015-04-05 DIAGNOSIS — IMO0002 Reserved for concepts with insufficient information to code with codable children: Secondary | ICD-10-CM | POA: Diagnosis present

## 2015-04-05 DIAGNOSIS — I69320 Aphasia following cerebral infarction: Secondary | ICD-10-CM

## 2015-04-05 DIAGNOSIS — F32A Depression, unspecified: Secondary | ICD-10-CM | POA: Diagnosis present

## 2015-04-05 DIAGNOSIS — E1165 Type 2 diabetes mellitus with hyperglycemia: Secondary | ICD-10-CM

## 2015-04-05 DIAGNOSIS — J45909 Unspecified asthma, uncomplicated: Secondary | ICD-10-CM | POA: Diagnosis not present

## 2015-04-05 DIAGNOSIS — Z79899 Other long term (current) drug therapy: Secondary | ICD-10-CM

## 2015-04-05 DIAGNOSIS — F0390 Unspecified dementia without behavioral disturbance: Secondary | ICD-10-CM | POA: Diagnosis not present

## 2015-04-05 DIAGNOSIS — J449 Chronic obstructive pulmonary disease, unspecified: Secondary | ICD-10-CM | POA: Diagnosis not present

## 2015-04-05 DIAGNOSIS — Z7951 Long term (current) use of inhaled steroids: Secondary | ICD-10-CM

## 2015-04-05 DIAGNOSIS — I69351 Hemiplegia and hemiparesis following cerebral infarction affecting right dominant side: Principal | ICD-10-CM

## 2015-04-05 DIAGNOSIS — E876 Hypokalemia: Secondary | ICD-10-CM

## 2015-04-05 DIAGNOSIS — I10 Essential (primary) hypertension: Secondary | ICD-10-CM | POA: Diagnosis not present

## 2015-04-05 DIAGNOSIS — E1140 Type 2 diabetes mellitus with diabetic neuropathy, unspecified: Secondary | ICD-10-CM | POA: Diagnosis present

## 2015-04-05 DIAGNOSIS — R197 Diarrhea, unspecified: Secondary | ICD-10-CM | POA: Diagnosis not present

## 2015-04-05 DIAGNOSIS — R059 Cough, unspecified: Secondary | ICD-10-CM

## 2015-04-05 DIAGNOSIS — I63439 Cerebral infarction due to embolism of unspecified posterior cerebral artery: Secondary | ICD-10-CM | POA: Diagnosis present

## 2015-04-05 LAB — GLUCOSE, CAPILLARY
Glucose-Capillary: 147 mg/dL — ABNORMAL HIGH (ref 65–99)
Glucose-Capillary: 222 mg/dL — ABNORMAL HIGH (ref 65–99)
Glucose-Capillary: 266 mg/dL — ABNORMAL HIGH (ref 65–99)
Glucose-Capillary: 275 mg/dL — ABNORMAL HIGH (ref 65–99)

## 2015-04-05 MED ORDER — PROCHLORPERAZINE MALEATE 5 MG PO TABS: 5.0000 mg | ORAL_TABLET | Freq: Four times a day (QID) | ORAL | Status: DC | PRN

## 2015-04-05 MED ORDER — DIPHENHYDRAMINE HCL 12.5 MG/5ML PO ELIX
12.5000 mg | ORAL_SOLUTION | Freq: Four times a day (QID) | ORAL | Status: DC | PRN
  Administered 2015-04-15 (×2): 25 mg via ORAL
  Filled 2015-04-05 (×2): qty 10

## 2015-04-05 MED ORDER — SULFAMETHOXAZOLE-TRIMETHOPRIM 800-160 MG PO TABS
1.0000 | ORAL_TABLET | Freq: Two times a day (BID) | ORAL | Status: DC
  Administered 2015-04-05 – 2015-04-07 (×4): 1 via ORAL
  Filled 2015-04-05 (×5): qty 1

## 2015-04-05 MED ORDER — DULOXETINE HCL 60 MG PO CPEP
60.0000 mg | ORAL_CAPSULE | Freq: Every day | ORAL | Status: DC
  Administered 2015-04-06 – 2015-04-20 (×15): 60 mg via ORAL
  Filled 2015-04-05 (×15): qty 1

## 2015-04-05 MED ORDER — POTASSIUM CHLORIDE CRYS ER 20 MEQ PO TBCR
20.0000 meq | EXTENDED_RELEASE_TABLET | Freq: Two times a day (BID) | ORAL | Status: AC
Start: 2015-04-05 — End: 2015-04-06
  Administered 2015-04-05 – 2015-04-06 (×2): 20 meq via ORAL
  Filled 2015-04-05 (×2): qty 1

## 2015-04-05 MED ORDER — ACETAMINOPHEN 325 MG PO TABS
325.0000 mg | ORAL_TABLET | ORAL | Status: DC | PRN
  Administered 2015-04-10 – 2015-04-11 (×2): 650 mg via ORAL
  Filled 2015-04-05 (×3): qty 2

## 2015-04-05 MED ORDER — ATORVASTATIN CALCIUM 10 MG PO TABS
10.0000 mg | ORAL_TABLET | Freq: Every day | ORAL | Status: DC
  Administered 2015-04-06 – 2015-04-19 (×14): 10 mg via ORAL
  Filled 2015-04-05 (×14): qty 1

## 2015-04-05 MED ORDER — PANTOPRAZOLE SODIUM 40 MG PO TBEC
40.0000 mg | DELAYED_RELEASE_TABLET | Freq: Every day | ORAL | Status: DC
  Administered 2015-04-06 – 2015-04-20 (×15): 40 mg via ORAL
  Filled 2015-04-05 (×15): qty 1

## 2015-04-05 MED ORDER — ALUM & MAG HYDROXIDE-SIMETH 200-200-20 MG/5ML PO SUSP: 30.0000 mL | ORAL | Status: DC | PRN

## 2015-04-05 MED ORDER — PREGABALIN 50 MG PO CAPS
150.0000 mg | ORAL_CAPSULE | Freq: Every day | ORAL | Status: DC
  Administered 2015-04-06 – 2015-04-20 (×15): 150 mg via ORAL
  Filled 2015-04-05 (×15): qty 3

## 2015-04-05 MED ORDER — PROCHLORPERAZINE 25 MG RE SUPP: 12.5000 mg | Freq: Four times a day (QID) | RECTAL | Status: DC | PRN

## 2015-04-05 MED ORDER — METFORMIN HCL 500 MG PO TABS
500.0000 mg | ORAL_TABLET | Freq: Two times a day (BID) | ORAL | Status: DC
  Administered 2015-04-06 – 2015-04-07 (×4): 500 mg via ORAL
  Filled 2015-04-05 (×4): qty 1

## 2015-04-05 MED ORDER — CLOPIDOGREL BISULFATE 75 MG PO TABS
75.0000 mg | ORAL_TABLET | Freq: Every day | ORAL | Status: DC
  Administered 2015-04-06 – 2015-04-20 (×15): 75 mg via ORAL
  Filled 2015-04-05 (×15): qty 1

## 2015-04-05 MED ORDER — INSULIN ASPART 100 UNIT/ML ~~LOC~~ SOLN
0.0000 [IU] | Freq: Three times a day (TID) | SUBCUTANEOUS | Status: DC
  Administered 2015-04-06 – 2015-04-07 (×3): 3 [IU] via SUBCUTANEOUS
  Administered 2015-04-07 – 2015-04-08 (×3): 2 [IU] via SUBCUTANEOUS
  Administered 2015-04-08: 9 [IU] via SUBCUTANEOUS
  Administered 2015-04-09: 2 [IU] via SUBCUTANEOUS
  Administered 2015-04-09: 3 [IU] via SUBCUTANEOUS
  Administered 2015-04-09: 1 [IU] via SUBCUTANEOUS
  Administered 2015-04-10: 5 [IU] via SUBCUTANEOUS
  Administered 2015-04-10: 3 [IU] via SUBCUTANEOUS
  Administered 2015-04-10: 2 [IU] via SUBCUTANEOUS
  Administered 2015-04-11: 7 [IU] via SUBCUTANEOUS
  Administered 2015-04-11: 5 [IU] via SUBCUTANEOUS
  Administered 2015-04-11: 2 [IU] via SUBCUTANEOUS
  Administered 2015-04-12 (×2): 3 [IU] via SUBCUTANEOUS
  Administered 2015-04-12 – 2015-04-13 (×4): 2 [IU] via SUBCUTANEOUS
  Administered 2015-04-14 – 2015-04-15 (×2): 1 [IU] via SUBCUTANEOUS
  Administered 2015-04-15 – 2015-04-16 (×2): 2 [IU] via SUBCUTANEOUS
  Administered 2015-04-16: 1 [IU] via SUBCUTANEOUS
  Administered 2015-04-17: 2 [IU] via SUBCUTANEOUS
  Administered 2015-04-17: 1 [IU] via SUBCUTANEOUS
  Administered 2015-04-17: 2 [IU] via SUBCUTANEOUS
  Administered 2015-04-18: 9 [IU] via SUBCUTANEOUS
  Administered 2015-04-18: 1 [IU] via SUBCUTANEOUS
  Administered 2015-04-19: 3 [IU] via SUBCUTANEOUS
  Administered 2015-04-19: 5 [IU] via SUBCUTANEOUS
  Administered 2015-04-19: 3 [IU] via SUBCUTANEOUS
  Administered 2015-04-20 (×2): 2 [IU] via SUBCUTANEOUS

## 2015-04-05 MED ORDER — INSULIN ASPART 100 UNIT/ML ~~LOC~~ SOLN
0.0000 [IU] | Freq: Every day | SUBCUTANEOUS | Status: DC
  Administered 2015-04-05: 3 [IU] via SUBCUTANEOUS
  Administered 2015-04-06 – 2015-04-11 (×3): 2 [IU] via SUBCUTANEOUS
  Administered 2015-04-19: 3 [IU] via SUBCUTANEOUS

## 2015-04-05 MED ORDER — ASPIRIN EC 81 MG PO TBEC
81.0000 mg | DELAYED_RELEASE_TABLET | Freq: Every day | ORAL | Status: DC
  Administered 2015-04-06 – 2015-04-20 (×15): 81 mg via ORAL
  Filled 2015-04-05 (×15): qty 1

## 2015-04-05 MED ORDER — INSULIN DETEMIR 100 UNIT/ML ~~LOC~~ SOLN
30.0000 [IU] | Freq: Two times a day (BID) | SUBCUTANEOUS | Status: DC
  Administered 2015-04-05 – 2015-04-06 (×2): 30 [IU] via SUBCUTANEOUS
  Filled 2015-04-05 (×3): qty 0.3

## 2015-04-05 MED ORDER — PROCHLORPERAZINE EDISYLATE 5 MG/ML IJ SOLN: 5.0000 mg | Freq: Four times a day (QID) | INTRAMUSCULAR | Status: DC | PRN

## 2015-04-05 MED ORDER — HYDROCORTISONE ACETATE 25 MG RE SUPP
25.0000 mg | Freq: Two times a day (BID) | RECTAL | Status: DC
  Administered 2015-04-05 – 2015-04-18 (×8): 25 mg via RECTAL
  Filled 2015-04-05 (×32): qty 1

## 2015-04-05 MED ORDER — SACCHAROMYCES BOULARDII 250 MG PO CAPS
250.0000 mg | ORAL_CAPSULE | Freq: Two times a day (BID) | ORAL | Status: DC
  Administered 2015-04-05 – 2015-04-20 (×30): 250 mg via ORAL
  Filled 2015-04-05 (×30): qty 1

## 2015-04-05 MED ORDER — ENOXAPARIN SODIUM 40 MG/0.4ML ~~LOC~~ SOLN
40.0000 mg | SUBCUTANEOUS | Status: DC
Start: 2015-04-05 — End: 2015-04-20
  Administered 2015-04-06 – 2015-04-19 (×14): 40 mg via SUBCUTANEOUS
  Filled 2015-04-05 (×14): qty 0.4

## 2015-04-05 MED ORDER — LEVOTHYROXINE SODIUM 50 MCG PO TABS
50.0000 ug | ORAL_TABLET | Freq: Every day | ORAL | Status: DC
  Administered 2015-04-06 – 2015-04-20 (×15): 50 ug via ORAL
  Filled 2015-04-05 (×4): qty 1
  Filled 2015-04-05: qty 2
  Filled 2015-04-05 (×11): qty 1

## 2015-04-05 MED ORDER — FLEET ENEMA 7-19 GM/118ML RE ENEM: 1.0000 | ENEMA | Freq: Once | RECTAL | Status: DC | PRN

## 2015-04-05 MED ORDER — MECLIZINE HCL 25 MG PO TABS
12.5000 mg | ORAL_TABLET | Freq: Four times a day (QID) | ORAL | Status: DC | PRN
  Administered 2015-04-07: 12.5 mg via ORAL
  Filled 2015-04-05: qty 1

## 2015-04-05 MED ORDER — INSULIN ASPART 100 UNIT/ML ~~LOC~~ SOLN
5.0000 [IU] | Freq: Three times a day (TID) | SUBCUTANEOUS | Status: DC
  Administered 2015-04-06 – 2015-04-12 (×19): 5 [IU] via SUBCUTANEOUS

## 2015-04-05 MED ORDER — BISACODYL 10 MG RE SUPP
10.0000 mg | Freq: Every day | RECTAL | Status: DC | PRN
  Administered 2015-04-06: 10 mg via RECTAL
  Filled 2015-04-05: qty 1

## 2015-04-05 MED ORDER — IPRATROPIUM-ALBUTEROL 0.5-2.5 (3) MG/3ML IN SOLN
3.0000 mL | Freq: Four times a day (QID) | RESPIRATORY_TRACT | Status: DC | PRN
  Administered 2015-04-10 – 2015-04-11 (×2): 3 mL via RESPIRATORY_TRACT
  Filled 2015-04-05 (×2): qty 3

## 2015-04-05 MED ORDER — GUAIFENESIN-DM 100-10 MG/5ML PO SYRP
5.0000 mL | ORAL_SOLUTION | Freq: Four times a day (QID) | ORAL | Status: DC | PRN
Start: 2015-04-05 — End: 2015-04-12
  Administered 2015-04-09 – 2015-04-11 (×3): 10 mL via ORAL
  Filled 2015-04-05 (×3): qty 10

## 2015-04-05 NOTE — Interval H&P Note (Signed)
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  The patient's history has been reviewed, patient examined, and there is no change in status.  Patient continues to be appropriate for intensive inpatient rehabilitation.  I have reviewed the patient's chart and labs.  Questions were answered to the patient's satisfaction. The PAPE has been reviewed and assessment remains appropriate.  Casey Harrell 04/05/2015, 6:03 PM

## 2015-04-05 NOTE — H&P (View-Only) (Signed)
  Physical Medicine and Rehabilitation Admission H&P    Chief Complaint  Patient presents with  . Right sided weakness, confusion, slurred speech.     HPI:  Casey Harrell is a 79 y.o. female with a history of dementia admitted on 11/23 with fever, 2 day history of diarrhea and confusion. MRI revealed acute infarcts involving the left periatrial white matter and left temporal-parietal region in addition to chronic lacunar and bilateral occipital infarcts. Pt noted to have acute neuro changes and had syncopal episode 11/25 with resultant aphasia, right upper > lower hemiparesis. Symptoms improved somewhat with bedrest and IVF for relative orthostasis. No acute findings on head CT but repeat MRI revealed interval extension of left periatrial and temporal-parietal infarcts with new infarcts involving the left temporal-parietal and occipital lobe most likely from hypotensive episode in setting of high grade stenosis L-M1 and/or P2 segments.   MRA brain revealed high grade stenosis proximal left M1 segment with extensive atheroma throughout posterior circulation and focal severe stenosis Left V4 segment. Carotid dopplers without significant ICA stenosis. 2 D echo with EF 60-65% with grade 2 diastolic dysfunction, atrial septal lipomatous hypertrophy and calcified MV with trivial regurgitation.   She was started on rocephin for Klebsiella UTI and mentation has improved. EEG revealed mild slowing and no seizure activity.  Neurology felt that left punctate strokes likely embolic due to unknown source and given MI stenosis recommended ASA and plavix  X 3 months followed by Plavix alone.  Dr. Xu recommends BP goal 130-160 and blood pressure medication adjusted. Patient continues to be limited by RUE weakness with decreased balance, unsteady gait,  lacks awareness and insight into deficits  and mild fluent aphasia complicated by baseline dementia. CIR recommended by MD and rehab team for follow up therapy.       Review of Systems  Constitutional: Negative for fever and malaise/fatigue.  HENT: Negative for hearing loss and tinnitus.   Eyes: Negative for blurred vision and double vision.  Respiratory: Negative for cough, sputum production and shortness of breath.   Cardiovascular: Negative for chest pain, palpitations and leg swelling.  Gastrointestinal: Negative for heartburn, nausea and abdominal pain.  Genitourinary: Positive for frequency. Negative for dysuria and urgency.  Musculoskeletal: Negative for myalgias, back pain and neck pain.  Neurological: Positive for dizziness (on and  off), sensory change and focal weakness. Negative for headaches.  Psychiatric/Behavioral: Positive for memory loss. The patient does not have insomnia.   All other systems reviewed and are negative.     Past Medical History  Diagnosis Date  . DIABETES-TYPE 2 07/15/2008  . DIABETIC PERIPHERAL NEUROPATHY 09/02/2009  . HYPERTENSION 07/15/2008  . ASTHMA 07/15/2008  . GERD 07/15/2008  . PVD (peripheral vascular disease) (HCC) 06/18/09    RCE  . Normal cardiac stress test     low risk Nuc 2009, Feb 2011  . COPD (chronic obstructive pulmonary disease) (HCC)   . Vertigo     Past Surgical History  Procedure Laterality Date  . Appendectomy  1971  . Abdominal hysterectomy  1971  . Tonsillectomy    . Tubal ligation    . Cesarean section    . Flexible sigmoidoscopy      diverticulitis  . Laparotomy      X 3 with adhesions lysis  . Foot surgery      right foot X 2  . Carotid endarterectomy  06/18/09    RCE    Family History  Problem Relation Age of Onset  .   Diabetes Mother   . Arthritis Mother   . Heart disease Mother   . Heart disease Father   . Celiac disease Sister   . Stroke Maternal Grandfather   . Cancer Paternal Grandfather     lung  . Heart disease Sister   . Heart disease Sister   . Asthma Sister   . Heart disease Brother   . Cancer Brother 60    lung cancer    Social History:   Married. 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 Independent and driving PTA--used a walker when "dizziness flared up".  She  reports that she has never smoked. She has never used smokeless tobacco. She reports that she does not drink alcohol. Her drug history is not on file.    Allergies  Allergen Reactions  . Doxycycline Hyclate Nausea And Vomiting    REACTION: vomiting  . Iohexol      Code: HIVES, Desc: pt. states she breaks out in hives 06/15/08   . Morphine Sulfate     REACTION: hallucinations  . Moxifloxacin     REACTION: unknown    Medications Prior to Admission  Medication Sig Dispense Refill  . amitriptyline (ELAVIL) 25 MG tablet Take 1 tablet (25 mg total) by mouth at bedtime. 90 tablet 1  . AZELASTINE HCL OP Apply to eye.    . canagliflozin (INVOKANA) 300 MG TABS tablet Take 300 mg by mouth daily before breakfast. 30 tablet 11  . carvedilol (COREG) 25 MG tablet Take 1 tablet (25 mg total) by mouth 2 (two) times daily with a meal. 180 tablet 1  . fluticasone (VERAMYST) 27.5 MCG/SPRAY nasal spray Place 2 sprays into the nose daily.    . hydrocortisone (ANUSOL-HC) 25 MG suppository Place 1 suppository (25 mg total) rectally 2 (two) times daily. 24 suppository 0  . Insulin Detemir (LEVEMIR FLEXTOUCH) 100 UNIT/ML Pen Inject 40 Units into the skin 2 (two) times daily. 15 mL 5  . insulin lispro (HUMALOG) 100 UNIT/ML KiwkPen INJECT 20 UNITS AT BREAKFAST, 10 UNITS AT LUNCH, AND 20 UNITS WITH SUPPER. 15 pen 5  . lansoprazole (PREVACID) 15 MG capsule Take 15 mg by mouth as needed (acid reflux).    . levothyroxine (SYNTHROID, LEVOTHROID) 50 MCG tablet Take 1 tablet (50 mcg total) by mouth daily before breakfast. 90 tablet 0  . meclizine (ANTIVERT) 12.5 MG tablet Take 1 tablet (12.5 mg total) by mouth every 6 (six) hours as needed for dizziness. 30 tablet 0  . metFORMIN (GLUCOPHAGE) 1000 MG tablet Take 1 tablet (1,000 mg total) by mouth 2 (two) times  daily with a meal. 180 tablet 3  . ondansetron (ZOFRAN ODT) 4 MG disintegrating tablet Take 1 tablet (4 mg total) by mouth every 8 (eight) hours as needed for nausea or vomiting. 10 tablet 0  . simvastatin (ZOCOR) 20 MG tablet TAKE 1 TABLET BY MOUTH AT BEDTIME 90 tablet 1  . valsartan (DIOVAN) 160 MG tablet TAKE 1 TABLET (160 MG TOTAL) BY MOUTH DAILY. 30 tablet 5  . albuterol (ACCUNEB) 1.25 MG/3ML nebulizer solution Take 1 ampule by nebulization every 6 (six) hours as needed for wheezing.    . aspirin 81 MG tablet Take 81 mg by mouth daily.      . B-D ULTRAFINE III SHORT PEN 31G X 8 MM MISC USE AS DIRECTED 100 each 1  . Calcium Citrate (CITRACAL PO) Take 1 tablet by mouth 2 (two) times daily.     .   Cholecalciferol (VITAMIN D3) 2000 UNITS TABS Take 2,000 Units by mouth daily.     . CLEVER CHEK AUTO-CODE VOICE test strip     . diltiazem (CARTIA XT) 120 MG 24 hr capsule Take 1 capsule (120 mg total) by mouth daily. NEEDS APPOINTMENT FOR FUTURE REFILLS 30 capsule 0  . DULoxetine (CYMBALTA) 60 MG capsule TAKE 1 CAPSULE BY MOUTH DAILY 90 capsule 1  . Lancets MISC     . magnesium oxide (MAG-OX) 400 (241.3 MG) MG tablet TAKE 1 TABLET BY MOUTH DAILY 30 tablet 3  . NOVOLOG FLEXPEN 100 UNIT/ML FlexPen INJECT 9 TO 14 UNITS UNDER THE SKIN 2 TIMES DAILY WITH A MEAL 9 UNITS BEFORE BREAKFAST AND 14 UNITS AFTER SUPPER 100 mL 0  . pantoprazole (PROTONIX) 40 MG tablet TAKE 1 TABLET BY MOUTH TWICE DAILY 180 tablet 3  . pregabalin (LYRICA) 150 MG capsule TAKE ONE CAPSULE BY MOUTH DAILY 90 capsule 1  . promethazine (PHENERGAN) 12.5 MG tablet 1-2 tabs po q6h prn nausea 30 tablet 1    Home: Home Living Family/patient expects to be discharged to:: Private residence Living Arrangements: Spouse/significant other Available Help at Discharge: Family, Available 24 hours/day Type of Home: House Home Access: Stairs to enter Entrance Stairs-Number of Steps: 3 Home Layout: One level Bathroom Shower/Tub: Walk-in shower,  Door Bathroom Toilet: Standard Bathroom Accessibility: Yes Home Equipment: Cane - single point, Walker - 2 wheels, Shower seat - built in Additional Comments: husband works during the day   Functional History: Prior Function Level of Independence: Independent with assistive device(s) Comments: husband assists her  Functional Status:  Mobility: Bed Mobility Overal bed mobility: Needs Assistance Bed Mobility: Supine to Sit Supine to sit: Mod assist General bed mobility comments: mainly needs assist under trunk and to finish scooting to edge of bed, cues for management of RUE Transfers Overall transfer level: Needs assistance Equipment used: 1 person hand held assist Transfers: Sit to/from Stand, Stand Pivot Transfers Sit to Stand: Mod assist Stand pivot transfers: Mod assist General transfer comment: Mod assist to assist with lift/lower from recliner. Cues for safety.  Ambulation/Gait General Gait Details: unable to do more than transfer, did pass out this am    ADL: ADL Overall ADL's : Needs assistance/impaired Eating/Feeding: Moderate assistance, Sitting Grooming: Moderate assistance, Sitting Upper Body Bathing: Maximal assistance, Sitting Lower Body Bathing: Maximal assistance, Sit to/from stand Upper Body Dressing : Maximal assistance, Sitting Lower Body Dressing: Maximal assistance, Sit to/from stand Toilet Transfer: Moderate assistance, Cueing for safety, Stand-pivot, BSC Toileting- Clothing Manipulation and Hygiene: Total assistance Functional mobility during ADLs: Moderate assistance General ADL Comments: Pt lethargic during session. Pt requires mod to total assist due to weakness throughout RUE and decreased overall cognition.   Cognition: Cognition Overall Cognitive Status: Impaired/Different from baseline Arousal/Alertness: Awake/alert Orientation Level: Oriented to person, Oriented to time, Oriented to place, Oriented to situation Attention:  Sustained Sustained Attention: Appears intact Memory: Impaired Memory Impairment: Retrieval deficit (baseline) Awareness: Impaired Awareness Impairment: Emergent impairment Problem Solving: Impaired Problem Solving Impairment: Functional basic Safety/Judgment: Impaired Cognition Arousal/Alertness: Awake/alert Behavior During Therapy: WFL for tasks assessed/performed Overall Cognitive Status: Impaired/Different from baseline Area of Impairment: Attention, Memory, Following commands, Safety/judgement, Awareness, Problem solving Current Attention Level: Selective Memory: Decreased short-term memory Following Commands: Follows one step commands with increased time, Follows multi-step commands inconsistently Safety/Judgement: Decreased awareness of safety, Decreased awareness of deficits Awareness: Intellectual Problem Solving: Slow processing, Decreased initiation, Difficulty sequencing, Requires verbal cues, Requires tactile cues General Comments: During interview   process with home living enviornment and equipment pt incorrectly asnwering questions, husband present to correctly answer questions   Blood pressure 143/64, pulse 82, temperature 98.7 F (37.1 C), temperature source Oral, resp. rate 20, height 5' 2" (1.575 m), weight 65.4 kg (144 lb 2.9 oz), SpO2 94 %. Physical Exam  Nursing note and vitals reviewed. Constitutional: She is oriented to person, place, and time. She appears well-developed and well-nourished.  HENT:  Head: Normocephalic and atraumatic.  Eyes: Conjunctivae and EOM are normal. Pupils are equal, round, and reactive to light. Left eye exhibits no discharge.  Neck: Normal range of motion. Neck supple.  Cardiovascular: Normal rate and regular rhythm.   Respiratory: Effort normal and breath sounds normal. No respiratory distress. She has no wheezes.  GI: Soft. Bowel sounds are normal. She exhibits no distension. There is no tenderness.  Musculoskeletal: She exhibits  no edema or tenderness.  Neurological: She is alert and oriented to person, place, and time. She has normal reflexes.  Polite and appropriate.  Anisocoria with slightly larger left pupil.  Speech clear. Able to follow one and two step motor commands.  Able to state month, city, state, DOB but was unable to state age or medical history.  Right deltoid 2/5, biceps/triceps 2/5, wrist 0/5, intrinsics 0/5.   RLE 4/5 proximal to distal LUE/LLE 4+/5 and mild ataxia with left finger to nose.  Significant sensory deficits on right.   Skin: Skin is warm and dry. No rash noted. She is not diaphoretic. No erythema.  Psychiatric: She has a normal mood and affect. Her speech is normal and behavior is normal. Cognition and memory are impaired.    Results for orders placed or performed during the hospital encounter of 03/31/15 (from the past 48 hour(s))  Glucose, capillary     Status: Abnormal   Collection Time: 04/03/15  9:46 PM  Result Value Ref Range   Glucose-Capillary 201 (H) 65 - 99 mg/dL   Comment 1 Notify RN    Comment 2 Document in Chart   CBC     Status: Abnormal   Collection Time: 04/04/15  2:58 AM  Result Value Ref Range   WBC 13.1 (H) 4.0 - 10.5 K/uL   RBC 4.90 3.87 - 5.11 MIL/uL   Hemoglobin 12.6 12.0 - 15.0 g/dL   HCT 39.7 36.0 - 46.0 %   MCV 81.0 78.0 - 100.0 fL   MCH 25.7 (L) 26.0 - 34.0 pg   MCHC 31.7 30.0 - 36.0 g/dL   RDW 16.5 (H) 11.5 - 15.5 %   Platelets 271 150 - 400 K/uL  Basic metabolic panel     Status: Abnormal   Collection Time: 04/04/15  2:58 AM  Result Value Ref Range   Sodium 138 135 - 145 mmol/L   Potassium 3.2 (L) 3.5 - 5.1 mmol/L   Chloride 104 101 - 111 mmol/L   CO2 25 22 - 32 mmol/L   Glucose, Bld 148 (H) 65 - 99 mg/dL   BUN 15 6 - 20 mg/dL   Creatinine, Ser 0.65 0.44 - 1.00 mg/dL   Calcium 9.7 8.9 - 10.3 mg/dL   GFR calc non Af Amer >60 >60 mL/min   GFR calc Af Amer >60 >60 mL/min    Comment: (NOTE) The eGFR has been calculated using the CKD EPI  equation. This calculation has not been validated in all clinical situations. eGFR's persistently <60 mL/min signify possible Chronic Kidney Disease.    Anion gap 9 5 - 15  Glucose,   capillary     Status: Abnormal   Collection Time: 04/04/15  6:20 AM  Result Value Ref Range   Glucose-Capillary 144 (H) 65 - 99 mg/dL   Comment 1 Notify RN    Comment 2 Document in Chart   Glucose, capillary     Status: Abnormal   Collection Time: 04/04/15 11:07 AM  Result Value Ref Range   Glucose-Capillary 223 (H) 65 - 99 mg/dL  Glucose, capillary     Status: Abnormal   Collection Time: 04/04/15  4:24 PM  Result Value Ref Range   Glucose-Capillary 188 (H) 65 - 99 mg/dL  Glucose, capillary     Status: Abnormal   Collection Time: 04/04/15  8:59 PM  Result Value Ref Range   Glucose-Capillary 250 (H) 65 - 99 mg/dL  Glucose, capillary     Status: Abnormal   Collection Time: 04/05/15  7:05 AM  Result Value Ref Range   Glucose-Capillary 147 (H) 65 - 99 mg/dL   Comment 1 Notify RN    Comment 2 Document in Chart   Glucose, capillary     Status: Abnormal   Collection Time: 04/05/15 11:36 AM  Result Value Ref Range   Glucose-Capillary 222 (H) 65 - 99 mg/dL   Comment 1 Notify RN    Comment 2 Document in Chart    No results found.     Medical Problem List and Plan: 1.  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  DVT Prophylaxis/Anticoagulation: Pharmaceutical: Lovenox 3. Pain Management:  Tylenol prn 4. Mood: LCSW to follow for evaluation and support.  5. Neuropsych: This patient is not capable of making decisions onher own behalf. 6. Skin/Wound Care: Routine pressure relief measures. Maintain adequate nutrition and hydration status 7. Fluids/Electrolytes/Nutrition:  Monitor I/O. Check lytes in am.  8. DM type 2:   Home regimen included Invokana 300 mg, Metformin 1000 mg bid, Levemir 40 mg bid and Novolog 20-10-20 ac.  Monitor BS ac/hs and continue Levemir 30 mg bid--add meal coverage and resume low dose metformin bid.   9. HTN: Monitor BP every 8 hours.  Check orthostatic BP in am and slowly resume medications as indicated. Continue coreg bid.   10. Diabetic Neuropathy:  Elavil was dc due to concerns of SE--Continue lyrica. and cymbalta 11. Leucocytosis: WBC on upward trend. Will monitor for fevers and other signs of infection.  12. Hypokalemia:  Will supplement for now and recheck in am.  Likely dilutional due to IVF which were d/c today.  13. Klebsiella UTI: On septra DS-- antibiotic D #4/ 5-7 days   14. OSA: Does not tolerate CPAP 15. Intermittent diarrhea x few months:  No BM for past two days. Will monitor for now.  16. COPD with chronic bronchitis/cough:  Continue nebs prn.   Post Admission Physician Evaluation: 1. 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 Patient is admitted to receive collaborative, interdisciplinary care between the physiatrist, rehab nursing staff, and therapy team. 3. Patient's level of medical complexity and substantial therapy needs in context of that medical necessity cannot be provided at a lesser intensity of care such as a SNF. 4. Patient has experienced substantial functional loss from his/her baseline which was documented above under the "Functional History" and "Functional Status" headings.  Judging by the patient's diagnosis, physical exam, and functional history, the patient has potential for functional progress which will result in measurable gains while on inpatient rehab.  These gains will be of substantial and practical use upon discharge  in facilitating mobility and self-care at the household level. 5. Physiatrist will provide 24 hour management of medical needs as well as oversight of the therapy plan/treatment and provide guidance as appropriate regarding the interaction of the  two. 6. 24 hour rehab nursing will assist with bladder management, bowel management, safety and skin/wound care and help integrate therapy concepts, techniques,education, etc. 7. PT will assess and treat for/with: Lower extremity strength, range of motion, stamina, balance, functional mobility, safety, adaptive techniques and equipment, woundcare, coping skills, pain control, stroke education.   Goals are: Supervision. 8. OT will assess and treat for/with: ADL's, functional mobility, safety, upper extremity strength, adaptive techniques and equipment, wound mgt, ego support, and community reintegration.   Goals are: supervision. Therapy may proceed with showering this patient. 9. SLP will assess and treat for/with: cognition, higher level procession.  Goals are: supervision. 10. Case Management and Social Worker will assess and treat for psychological issues and discharge planning. 11. Team conference will be held weekly to assess progress toward goals and to determine barriers to discharge. 12. Patient will receive at least 3 hours of therapy per day at least 5 days per week. 13. ELOS: 16-19 days.        14. Prognosis:  good   Delice Lesch, MD  04/05/2015

## 2015-04-05 NOTE — PMR Pre-admission (Signed)
PMR Admission Coordinator Pre-Admission Assessment  Patient: Casey Harrell is an 80 y.o., female MRN: [id number] DOB: 10-22-1934 Height: '5\' 2"'  (157.5 cm) Weight: 65.4 kg (144 lb 2.9 oz)              Insurance Information HMO: Yes     PPO:       PCP:       IPA:       80/20:       OTHER:  Group # [id number] PRIMARY: BCBS Medicare      Policy#:  [id number]      Subscriber: [name] CM Name: Limmie Patricia      Phone#: [phone]     Fax#: [phone] Pre-Cert#:                               Employer: Retired Benefits:  Phone #: [phone]     Name:  Automated Eff. Date: 05/08/14     Deduct: $0      Out of Pocket Max: $3950 (met $201.69)     Life Max: unlimited CIR: $275 days 1-6      SNF: $0 days 1-20; $75 days 21-49; $100 days 50-100 Outpatient: medical necessity     Co-Pay: $40/visit Home Health: 100%      Co-Pay: none DME: 80%     Co-Pay: 20% Providers: in network  Medicaid Application Date:        Case Manager:   Disability Application Date:        Case Worker:    Emergency Facilities manager Information    Name Relation Home Work Mobile   Moise,Clifton Spouse [place] Son [phone]  [phone]     Current Medical History  Patient Admitting Diagnosis: 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 female with a history of dementia admitted on 11/23 with fever, confusion. MRI revealed acute infarcts involving the left periatrial white matter and left temporal-parietal region in addition to chronic lacunar and bilateral occipital infarcts. Pt noted to have acute neuro changes while sitting up in chair on 11/25 with aphasia, right upper > lower hemiparesis. Symptoms improved somewhat with bedrest and IVF for relative orthostasis. No acute findings on head CT but repeat MRI revealed interval extension of left periatrial and temporal-parietal infarcts with new infarcts  involving the left temporal-parietal and occipital lobe. Pt was diagnosed with a klebsiella UTI for which she was started on rocephin.      Total: 5=NIH  Past Medical History  Past Medical History  Diagnosis Date  . DIABETES-TYPE 2 07/15/2008  . DIABETIC PERIPHERAL NEUROPATHY 09/02/2009  . HYPERTENSION 07/15/2008  . ASTHMA 07/15/2008  . GERD 07/15/2008  . PVD (peripheral vascular disease) ([facility]) 06/18/09    RCE  . Normal cardiac stress test     low risk Nuc 2009, Feb 2011  . COPD (chronic obstructive pulmonary disease) (HCC)     Family History  family history includes Arthritis in her mother; Asthma in her sister; Cancer in her paternal grandfather; Cancer (age of onset: 82) in her brother; Celiac disease in her sister; Diabetes in her mother; Heart disease in her brother, father, mother, sister, and sister; Stroke in her maternal grandfather.  Prior Rehab/Hospitalizations:  No previous rehab admissions.  Has the patient had major surgery during 100 days prior to admission? No  Current Medications   Current facility-administered  medications:  .  0.9 %  sodium chloride infusion, , Intravenous, Continuous, Rosalin Hawking, MD, Last Rate: 125 mL/hr at 04/02/15 1755 .  aspirin EC tablet 81 mg, 81 mg, Oral, Daily, Donzetta Starch, NP, 81 mg at 04/05/15 8416 .  atorvastatin (LIPITOR) tablet 10 mg, 10 mg, Oral, q1800, Hosie Poisson, MD, 10 mg at 04/04/15 1754 .  clopidogrel (PLAVIX) tablet 75 mg, 75 mg, Oral, Daily, Early Chars Rinehuls, PA-C, 75 mg at 04/05/15 6063 .  DULoxetine (CYMBALTA) DR capsule 60 mg, 60 mg, Oral, Daily, Hosie Poisson, MD, 60 mg at 04/05/15 0821 .  enoxaparin (LOVENOX) injection 40 mg, 40 mg, Subcutaneous, Q24H, Hosie Poisson, MD, 40 mg at 04/04/15 2101 .  hydrocortisone (ANUSOL-HC) suppository 25 mg, 25 mg, Rectal, BID, Hosie Poisson, MD, 25 mg at 04/05/15 1017 .  insulin aspart (novoLOG) injection 0-5 Units, 0-5 Units, Subcutaneous, QHS, Hosie Poisson, MD, 2 Units at 04/04/15 2113 .   insulin aspart (novoLOG) injection 0-9 Units, 0-9 Units, Subcutaneous, TID WC, Hosie Poisson, MD, 3 Units at 04/05/15 1417 .  insulin detemir (LEVEMIR) injection 30 Units, 30 Units, Subcutaneous, BID, Charlynne Cousins, MD, 30 Units at 04/05/15 [id number] .  levothyroxine (SYNTHROID, LEVOTHROID) tablet 50 mcg, 50 mcg, Oral, QAC breakfast, Hosie Poisson, MD, 50 mcg at 04/05/15 0821 .  meclizine (ANTIVERT) tablet 12.5 mg, 12.5 mg, Oral, Q6H PRN, Charlynne Cousins, MD .  ondansetron (ZOFRAN-ODT) disintegrating tablet 4 mg, 4 mg, Oral, Q8H PRN, Charlynne Cousins, MD .  pantoprazole (PROTONIX) EC tablet 40 mg, 40 mg, Oral, Daily, Hosie Poisson, MD, 40 mg at 04/05/15 0821 .  pregabalin (LYRICA) capsule 150 mg, 150 mg, Oral, Daily, Hosie Poisson, MD, 150 mg at 04/05/15 0820 .  sulfamethoxazole-trimethoprim (BACTRIM DS,SEPTRA DS) 800-160 MG per tablet 1 tablet, 1 tablet, Oral, Q12H, Charlynne Cousins, MD, 1 tablet at 04/05/15 0820  Patients Current Diet: Diet Heart Room service appropriate?: Yes; Fluid consistency:: Thin Diet - low sodium heart healthy  Precautions / Restrictions Precautions Precautions: Fall Restrictions Weight Bearing Restrictions: No   Has the patient had 2 or more falls or a fall with injury in the past year?No  Prior Activity Level Community (5-7x/wk): Went out daily.  Was driving.  Home Assistive Devices / Equipment Home Assistive Devices/Equipment: Eyeglasses, Environmental consultant (specify type), Kasandra Knudsen (specify quad or straight) Home Equipment: Cane - single point, Walker - 2 wheels, Shower seat - built in  Prior Device Use: Indicate devices/aids used by the patient prior to current illness, exacerbation or injury? Rolling Walker  Prior Functional Level Prior Function Level of Independence: Independent with assistive device(s) Comments: husband assists her  Self Care: Did the patient need help bathing, dressing, using the toilet or eating?  Independent  Indoor Mobility: Did the  patient need assistance with walking from room to room (with or without device)? Independent  Stairs: Did the patient need assistance with internal or external stairs (with or without device)? Independent  Functional Cognition: Did the patient need help planning regular tasks such as shopping or remembering to take medications? Independent  Current Functional Level Cognition  Arousal/Alertness: Awake/alert Overall Cognitive Status: Impaired/Different from baseline Current Attention Level: Selective Orientation Level: Oriented to person, Oriented to time, Oriented to place, Oriented to situation Following Commands: Follows one step commands with increased time, Follows multi-step commands inconsistently Safety/Judgement: Decreased awareness of safety, Decreased awareness of deficits General Comments: During interview process with home living enviornment and equipment pt incorrectly asnwering questions, husband present to correctly answer  questions Attention: Sustained Sustained Attention: Appears intact Memory: Impaired Memory Impairment: Retrieval deficit (baseline) Awareness: Impaired Awareness Impairment: Emergent impairment Problem Solving: Impaired Problem Solving Impairment: Functional basic Safety/Judgment: Impaired    Extremity Assessment (includes Sensation/Coordination)  Upper Extremity Assessment: RUE deficits/detail RUE Deficits / Details: decreased strength throughout RUE, strength grossly 2/5 RUE Sensation: decreased light touch, decreased proprioception RUE Coordination: decreased fine motor, decreased gross motor  Lower Extremity Assessment: Defer to PT evaluation    ADLs  Overall ADL's : Needs assistance/impaired Eating/Feeding: Moderate assistance, Sitting Grooming: Moderate assistance, Sitting Upper Body Bathing: Maximal assistance, Sitting Lower Body Bathing: Maximal assistance, Sit to/from stand Upper Body Dressing : Maximal assistance, Sitting Lower Body  Dressing: Maximal assistance, Sit to/from stand Toilet Transfer: Moderate assistance, Cueing for safety, Stand-pivot, BSC Toileting- Clothing Manipulation and Hygiene: Total assistance Functional mobility during ADLs: Moderate assistance General ADL Comments: Pt lethargic during session. Pt requires mod to total assist due to weakness throughout RUE and decreased overall cognition.     Mobility  Overal bed mobility: Needs Assistance Bed Mobility: Supine to Sit Supine to sit: Mod assist General bed mobility comments: mainly needs assist under trunk and to finish scooting to edge of bed, cues for management of RUE    Transfers  Overall transfer level: Needs assistance Equipment used: 1 person hand held assist Transfers: Sit to/from Stand, Stand Pivot Transfers Sit to Stand: Mod assist Stand pivot transfers: Mod assist General transfer comment: Mod assist to assist with lift/lower from recliner. Cues for safety.     Ambulation / Gait / Stairs / Wheelchair Mobility  Ambulation/Gait General Gait Details: unable to do more than transfer, did pass out this am    Posture / Balance Balance Overall balance assessment: Needs assistance Sitting-balance support: No upper extremity supported, Feet supported Sitting balance-Leahy Scale: Fair Postural control: Posterior lean Standing balance support: No upper extremity supported Standing balance-Leahy Scale: Poor    Special needs/care consideration BiPAP/CPAP No CPM No Continuous Drip IV 0.9% NS 125 ml/hr Dialysis No    Life Vest No Oxygen No Special Bed No Trach Size No Wound Vac (area) No     Skin No                             Bowel mgmt: Last BM 04/02/15 Bladder mgmt: Wears depends, has urgency incontinence Diabetic mgmt Yes, on insulin at home    Previous Home Environment Living Arrangements: Spouse/significant other Available Help at Discharge: Family, Available 24 hours/day Type of Home: House Home Layout: One level Home  Access: Stairs to enter Technical brewer of Steps: 3 Bathroom Shower/Tub: Gaffer, Charity fundraiser: Programmer, systems: Yes Home Care Services: No Additional Comments: husband works during the day  [place] for Discharge Living Setting: Patient's home, House, Lives with (comment) (Lives with husband.) Type of Home at Discharge: House Discharge Home Layout: One level Discharge Home Access: Stairs to enter Entrance Stairs-Number of Steps: 3 Does the patient have any problems obtaining your medications?: No  Social/Family/Support Systems Patient Roles: Spouse, Parent, Other (Comment) (Has a husband, son and grand daughter.) Contact Information: Larcenia Holaday - husband [phone] Anticipated Caregiver: Rozanna Boer daughter during the day Ability/Limitations of Caregiver: Husband works parttime 7 am to 3 pm.  101 yo grand daughter to stay with patient while husband works. Caregiver Availability: 24/7 Discharge Plan Discussed with Primary Caregiver: Yes Is Caregiver In Agreement with Plan?: Yes Does Caregiver/Family have Issues  with Lodging/Transportation while Pt is in Rehab?: No  Goals/Additional Needs Patient/Family Goal for Rehab: PT supervision, OT supervision to min assist, ST supervision goals Expected length of stay: 13-20 days Cultural Considerations: [employer] Dietary Needs: Heart diet, thin liquids Equipment Needs: TBD Pt/Family Agrees to Admission and willing to participate: Yes Program Orientation Provided & Reviewed with Pt/Caregiver Including Roles  & Responsibilities: Yes  Decrease burden of Care through IP rehab admission: N/A  Possible need for SNF placement upon discharge: Not anticipated  Patient Condition: This patient's medical and functional status has changed since the consult dated: 04/03/15 in which the Rehabilitation Physician determined and documented that the patient's condition is appropriate for intensive  rehabilitative care in an inpatient rehabilitation facility. See "History of Present Illness" (above) for medical update. Functional changes are: Currently requiring mod assist for stand pivot transfers. Patient's medical and functional status update has been discussed with the Rehabilitation physician and patient remains appropriate for inpatient rehabilitation. Will admit to inpatient rehab today.  Preadmission Screen Completed By:  Retta Diones, 04/05/2015 3:46 PM ______________________________________________________________________   Discussed status with Dr. Posey Pronto on 04/05/15 at 1526 and received telephone approval for admission today.  Admission Coordinator:  Retta Diones, time1547/Date11/28/16

## 2015-04-05 NOTE — Clinical Social Work Placement (Signed)
CLINICAL SOCIAL WORK PLACEMENT  NOTE  Date:  04/05/2015  Patient Details  Name: Casey Harrell MRN: [id number] Date of Birth: [birth year]  Clinical Social Work is seeking post-discharge placement for this patient at the Skilled  Nursing Facility level of care (*CSW will initial, date and re-position this form in  chart as items are completed):  Yes   Patient/family provided with [facility] Clinical Social Work Department's list of facilities offering this level of care within the geographic area requested by the patient (or if unable, by the patient's family).  Yes   Patient/family informed of their freedom to choose among providers that offer the needed level of care, that participate in Medicare, Medicaid or managed care program needed by the patient, have an available bed and are willing to accept the patient.  Yes   Patient/family informed of [facility]'s ownership interest in [facility] and [facility], as well as of the fact that they are under no obligation to receive care at these facilities.  PASRR submitted to EDS on 04/05/15     PASRR number received on 04/05/15     Existing PASRR number confirmed on 04/05/15     FL2 transmitted to all facilities in geographic area requested by pt/family on 04/05/15     FL2 transmitted to all facilities within larger geographic area on       Patient informed that his/her managed care company has contracts with or will negotiate with certain facilities, including the following:        Yes   Patient/family informed of bed offers received.  Patient chooses bed at  [facility]      Physician recommends and patient chooses bed at      Patient to be transferred to   on  .  Patient to be transferred to facility by       Patient family notified on   of transfer.  Name of family member notified:        PHYSICIAN Please sign FL2     Additional Comment:    _______________________________________________ Vaughan Browner, LCSW 04/05/2015, 3:19 PM

## 2015-04-05 NOTE — Clinical Social Work Note (Signed)
Clinical Social Worker has assessed patient and pt's spouse. Full psychosocial assessment to follow.   CSW remains available as needed.   Derenda FennelBashira Azadeh Hyder, MSW, LCSWA [phone] 04/05/2015 10:34 AM

## 2015-04-05 NOTE — Progress Notes (Signed)
Rehab admissions - [facility] for [facility].  Bed available and will admit to inpatient rehab today.  Call me for questions.  #[phone]

## 2015-04-05 NOTE — Care Management Important Message (Signed)
Important Message  Patient Details  Name: Casey Harrell MRN: [id number] Date of Birth: 1934/10/14   Medicare Important Message Given:  Yes    [name] 04/05/2015, 1:38 PM

## 2015-04-05 NOTE — Progress Notes (Signed)
Patient escorted to IP rehab. Settled into room, BP 161/75. Educated patient and husband. No complaints of pain. Resting comfortably.

## 2015-04-05 NOTE — Care Management Note (Signed)
Case Management Note  Patient Details  Name: Casey Harrell MRN: [id number] Date of Birth: [birth year]  Subjective/Objective:                    Action/Plan: Patient being discharged to CIR today. No further needs per CM.   Expected Discharge Date:   (unknown)               Expected Discharge Plan:  IP Rehab Facility  In-House Referral:     Discharge planning Services     Post Acute Care Choice:    Choice offered to:     DME Arranged:    DME Agency:     HH Arranged:    HH Agency:     Status of Service:  Completed, signed off  Medicare Important Message Given:  Yes Date Medicare IM Given:    Medicare IM give by:    Date Additional Medicare IM Given:    Additional Medicare Important Message give by:     If discussed at Long Length of Stay Meetings, dates discussed:    Additional Comments:  Kermit BaloKelli F Jalisia Puchalski, RN 04/05/2015, 4:10 PM

## 2015-04-05 NOTE — H&P (Signed)
  Physical Medicine and Rehabilitation Admission H&P    Chief Complaint  Patient presents with  . Right sided weakness, confusion, slurred speech.     HPI:  Casey Harrell is a 80 y.o. female with a history of dementia admitted on 11/23 with fever, 2 day history of diarrhea and confusion. MRI revealed acute infarcts involving the left periatrial white matter and left temporal-parietal region in addition to chronic lacunar and bilateral occipital infarcts. Pt noted to have acute neuro changes and had syncopal episode 11/25 with resultant aphasia, right upper > lower hemiparesis. Symptoms improved somewhat with bedrest and IVF for relative orthostasis. No acute findings on head CT but repeat MRI revealed interval extension of left periatrial and temporal-parietal infarcts with new infarcts involving the left temporal-parietal and occipital lobe most likely from hypotensive episode in setting of high grade stenosis L-M1 and/or P2 segments.   MRA brain revealed high grade stenosis proximal left M1 segment with extensive atheroma throughout posterior circulation and focal severe stenosis Left V4 segment. Carotid dopplers without significant ICA stenosis. 2 D echo with EF 60-65% with grade 2 diastolic dysfunction, atrial septal lipomatous hypertrophy and calcified MV with trivial regurgitation.   She was started on rocephin for Klebsiella UTI and mentation has improved. EEG revealed mild slowing and no seizure activity.  Neurology felt that left punctate strokes likely embolic due to unknown source and given MI stenosis recommended ASA and plavix  X 3 months followed by Plavix alone.  Dr. Xu recommends BP goal 130-160 and blood pressure medication adjusted. Patient continues to be limited by RUE weakness with decreased balance, unsteady gait,  lacks awareness and insight into deficits  and mild fluent aphasia complicated by baseline dementia. CIR recommended by MD and rehab team for follow up therapy.       Review of Systems  Constitutional: Negative for fever and malaise/fatigue.  HENT: Negative for hearing loss and tinnitus.   Eyes: Negative for blurred vision and double vision.  Respiratory: Negative for cough, sputum production and shortness of breath.   Cardiovascular: Negative for chest pain, palpitations and leg swelling.  Gastrointestinal: Negative for heartburn, nausea and abdominal pain.  Genitourinary: Positive for frequency. Negative for dysuria and urgency.  Musculoskeletal: Negative for myalgias, back pain and neck pain.  Neurological: Positive for dizziness (on and  off), sensory change and focal weakness. Negative for headaches.  Psychiatric/Behavioral: Positive for memory loss. The patient does not have insomnia.   All other systems reviewed and are negative.     Past Medical History  Diagnosis Date  . DIABETES-TYPE 2 07/15/2008  . DIABETIC PERIPHERAL NEUROPATHY 09/02/2009  . HYPERTENSION 07/15/2008  . ASTHMA 07/15/2008  . GERD 07/15/2008  . PVD (peripheral vascular disease) (HCC) 06/18/09    RCE  . Normal cardiac stress test     low risk Nuc 2009, Feb 2011  . COPD (chronic obstructive pulmonary disease) (HCC)   . Vertigo     Past Surgical History  Procedure Laterality Date  . Appendectomy  1971  . Abdominal hysterectomy  1971  . Tonsillectomy    . Tubal ligation    . Cesarean section    . Flexible sigmoidoscopy      diverticulitis  . Laparotomy      X 3 with adhesions lysis  . Foot surgery      right foot X 2  . Carotid endarterectomy  06/18/09    RCE    Family History  Problem Relation Age of Onset  .   Diabetes Mother   . Arthritis Mother   . Heart disease Mother   . Heart disease Father   . Celiac disease Sister   . Stroke Maternal Grandfather   . Cancer Paternal Grandfather     lung  . Heart disease Sister   . Heart disease Sister   . Asthma Sister   . Heart disease Brother   . Cancer Brother 60    lung cancer    Social History:   Married. 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 Independent and driving PTA--used a walker when "dizziness flared up".  She  reports that she has never smoked. She has never used smokeless tobacco. She reports that she does not drink alcohol. Her drug history is not on file.    Allergies  Allergen Reactions  . Doxycycline Hyclate Nausea And Vomiting    REACTION: vomiting  . Iohexol      Code: HIVES, Desc: pt. states she breaks out in hives 06/15/08   . Morphine Sulfate     REACTION: hallucinations  . Moxifloxacin     REACTION: unknown    Medications Prior to Admission  Medication Sig Dispense Refill  . amitriptyline (ELAVIL) 25 MG tablet Take 1 tablet (25 mg total) by mouth at bedtime. 90 tablet 1  . AZELASTINE HCL OP Apply to eye.    . canagliflozin (INVOKANA) 300 MG TABS tablet Take 300 mg by mouth daily before breakfast. 30 tablet 11  . carvedilol (COREG) 25 MG tablet Take 1 tablet (25 mg total) by mouth 2 (two) times daily with a meal. 180 tablet 1  . fluticasone (VERAMYST) 27.5 MCG/SPRAY nasal spray Place 2 sprays into the nose daily.    . hydrocortisone (ANUSOL-HC) 25 MG suppository Place 1 suppository (25 mg total) rectally 2 (two) times daily. 24 suppository 0  . Insulin Detemir (LEVEMIR FLEXTOUCH) 100 UNIT/ML Pen Inject 40 Units into the skin 2 (two) times daily. 15 mL 5  . insulin lispro (HUMALOG) 100 UNIT/ML KiwkPen INJECT 20 UNITS AT BREAKFAST, 10 UNITS AT LUNCH, AND 20 UNITS WITH SUPPER. 15 pen 5  . lansoprazole (PREVACID) 15 MG capsule Take 15 mg by mouth as needed (acid reflux).    . levothyroxine (SYNTHROID, LEVOTHROID) 50 MCG tablet Take 1 tablet (50 mcg total) by mouth daily before breakfast. 90 tablet 0  . meclizine (ANTIVERT) 12.5 MG tablet Take 1 tablet (12.5 mg total) by mouth every 6 (six) hours as needed for dizziness. 30 tablet 0  . metFORMIN (GLUCOPHAGE) 1000 MG tablet Take 1 tablet (1,000 mg total) by mouth 2 (two) times  daily with a meal. 180 tablet 3  . ondansetron (ZOFRAN ODT) 4 MG disintegrating tablet Take 1 tablet (4 mg total) by mouth every 8 (eight) hours as needed for nausea or vomiting. 10 tablet 0  . simvastatin (ZOCOR) 20 MG tablet TAKE 1 TABLET BY MOUTH AT BEDTIME 90 tablet 1  . valsartan (DIOVAN) 160 MG tablet TAKE 1 TABLET (160 MG TOTAL) BY MOUTH DAILY. 30 tablet 5  . albuterol (ACCUNEB) 1.25 MG/3ML nebulizer solution Take 1 ampule by nebulization every 6 (six) hours as needed for wheezing.    . aspirin 81 MG tablet Take 81 mg by mouth daily.      . B-D ULTRAFINE III SHORT PEN 31G X 8 MM MISC USE AS DIRECTED 100 each 1  . Calcium Citrate (CITRACAL PO) Take 1 tablet by mouth 2 (two) times daily.     .   Cholecalciferol (VITAMIN D3) 2000 UNITS TABS Take 2,000 Units by mouth daily.     . CLEVER CHEK AUTO-CODE VOICE test strip     . diltiazem (CARTIA XT) 120 MG 24 hr capsule Take 1 capsule (120 mg total) by mouth daily. NEEDS APPOINTMENT FOR FUTURE REFILLS 30 capsule 0  . DULoxetine (CYMBALTA) 60 MG capsule TAKE 1 CAPSULE BY MOUTH DAILY 90 capsule 1  . Lancets MISC     . magnesium oxide (MAG-OX) 400 (241.3 MG) MG tablet TAKE 1 TABLET BY MOUTH DAILY 30 tablet 3  . NOVOLOG FLEXPEN 100 UNIT/ML FlexPen INJECT 9 TO 14 UNITS UNDER THE SKIN 2 TIMES DAILY WITH A MEAL 9 UNITS BEFORE BREAKFAST AND 14 UNITS AFTER SUPPER 100 mL 0  . pantoprazole (PROTONIX) 40 MG tablet TAKE 1 TABLET BY MOUTH TWICE DAILY 180 tablet 3  . pregabalin (LYRICA) 150 MG capsule TAKE ONE CAPSULE BY MOUTH DAILY 90 capsule 1  . promethazine (PHENERGAN) 12.5 MG tablet 1-2 tabs po q6h prn nausea 30 tablet 1    Home: Home Living Family/patient expects to be discharged to:: Private residence Living Arrangements: Spouse/significant other Available Help at Discharge: Family, Available 24 hours/day Type of Home: House Home Access: Stairs to enter Entrance Stairs-Number of Steps: 3 Home Layout: One level Bathroom Shower/Tub: Walk-in shower,  Door Bathroom Toilet: Standard Bathroom Accessibility: Yes Home Equipment: Cane - single point, Walker - 2 wheels, Shower seat - built in Additional Comments: husband works during the day   Functional History: Prior Function Level of Independence: Independent with assistive device(s) Comments: husband assists her  Functional Status:  Mobility: Bed Mobility Overal bed mobility: Needs Assistance Bed Mobility: Supine to Sit Supine to sit: Mod assist General bed mobility comments: mainly needs assist under trunk and to finish scooting to edge of bed, cues for management of RUE Transfers Overall transfer level: Needs assistance Equipment used: 1 person hand held assist Transfers: Sit to/from Stand, Stand Pivot Transfers Sit to Stand: Mod assist Stand pivot transfers: Mod assist General transfer comment: Mod assist to assist with lift/lower from recliner. Cues for safety.  Ambulation/Gait General Gait Details: unable to do more than transfer, did pass out this am    ADL: ADL Overall ADL's : Needs assistance/impaired Eating/Feeding: Moderate assistance, Sitting Grooming: Moderate assistance, Sitting Upper Body Bathing: Maximal assistance, Sitting Lower Body Bathing: Maximal assistance, Sit to/from stand Upper Body Dressing : Maximal assistance, Sitting Lower Body Dressing: Maximal assistance, Sit to/from stand Toilet Transfer: Moderate assistance, Cueing for safety, Stand-pivot, BSC Toileting- Clothing Manipulation and Hygiene: Total assistance Functional mobility during ADLs: Moderate assistance General ADL Comments: Pt lethargic during session. Pt requires mod to total assist due to weakness throughout RUE and decreased overall cognition.   Cognition: Cognition Overall Cognitive Status: Impaired/Different from baseline Arousal/Alertness: Awake/alert Orientation Level: Oriented to person, Oriented to time, Oriented to place, Oriented to situation Attention:  Sustained Sustained Attention: Appears intact Memory: Impaired Memory Impairment: Retrieval deficit (baseline) Awareness: Impaired Awareness Impairment: Emergent impairment Problem Solving: Impaired Problem Solving Impairment: Functional basic Safety/Judgment: Impaired Cognition Arousal/Alertness: Awake/alert Behavior During Therapy: WFL for tasks assessed/performed Overall Cognitive Status: Impaired/Different from baseline Area of Impairment: Attention, Memory, Following commands, Safety/judgement, Awareness, Problem solving Current Attention Level: Selective Memory: Decreased short-term memory Following Commands: Follows one step commands with increased time, Follows multi-step commands inconsistently Safety/Judgement: Decreased awareness of safety, Decreased awareness of deficits Awareness: Intellectual Problem Solving: Slow processing, Decreased initiation, Difficulty sequencing, Requires verbal cues, Requires tactile cues General Comments: During interview   process with home living enviornment and equipment pt incorrectly asnwering questions, husband present to correctly answer questions   Blood pressure 143/64, pulse 82, temperature 98.7 F (37.1 C), temperature source Oral, resp. rate 20, height 5' 2" (1.575 m), weight 65.4 kg (144 lb 2.9 oz), SpO2 94 %. Physical Exam  Nursing note and vitals reviewed. Constitutional: She is oriented to person, place, and time. She appears well-developed and well-nourished.  HENT:  Head: Normocephalic and atraumatic.  Eyes: Conjunctivae and EOM are normal. Pupils are equal, round, and reactive to light. Left eye exhibits no discharge.  Neck: Normal range of motion. Neck supple.  Cardiovascular: Normal rate and regular rhythm.   Respiratory: Effort normal and breath sounds normal. No respiratory distress. She has no wheezes.  GI: Soft. Bowel sounds are normal. She exhibits no distension. There is no tenderness.  Musculoskeletal: She exhibits  no edema or tenderness.  Neurological: She is alert and oriented to person, place, and time. She has normal reflexes.  Polite and appropriate.  Anisocoria with slightly larger left pupil.  Speech clear. Able to follow one and two step motor commands.  Able to state month, city, state, DOB but was unable to state age or medical history.  Right deltoid 2/5, biceps/triceps 2/5, wrist 0/5, intrinsics 0/5.   RLE 4/5 proximal to distal LUE/LLE 4+/5 and mild ataxia with left finger to nose.  Significant sensory deficits on right.   Skin: Skin is warm and dry. No rash noted. She is not diaphoretic. No erythema.  Psychiatric: She has a normal mood and affect. Her speech is normal and behavior is normal. Cognition and memory are impaired.    Results for orders placed or performed during the hospital encounter of 03/31/15 (from the past 48 hour(s))  Glucose, capillary     Status: Abnormal   Collection Time: 04/03/15  9:46 PM  Result Value Ref Range   Glucose-Capillary 201 (H) 65 - 99 mg/dL   Comment 1 Notify RN    Comment 2 Document in Chart   CBC     Status: Abnormal   Collection Time: 04/04/15  2:58 AM  Result Value Ref Range   WBC 13.1 (H) 4.0 - 10.5 K/uL   RBC 4.90 3.87 - 5.11 MIL/uL   Hemoglobin 12.6 12.0 - 15.0 g/dL   HCT 39.7 36.0 - 46.0 %   MCV 81.0 78.0 - 100.0 fL   MCH 25.7 (L) 26.0 - 34.0 pg   MCHC 31.7 30.0 - 36.0 g/dL   RDW 16.5 (H) 11.5 - 15.5 %   Platelets 271 150 - 400 K/uL  Basic metabolic panel     Status: Abnormal   Collection Time: 04/04/15  2:58 AM  Result Value Ref Range   Sodium 138 135 - 145 mmol/L   Potassium 3.2 (L) 3.5 - 5.1 mmol/L   Chloride 104 101 - 111 mmol/L   CO2 25 22 - 32 mmol/L   Glucose, Bld 148 (H) 65 - 99 mg/dL   BUN 15 6 - 20 mg/dL   Creatinine, Ser 0.65 0.44 - 1.00 mg/dL   Calcium 9.7 8.9 - 10.3 mg/dL   GFR calc non Af Amer >60 >60 mL/min   GFR calc Af Amer >60 >60 mL/min    Comment: (NOTE) The eGFR has been calculated using the CKD EPI  equation. This calculation has not been validated in all clinical situations. eGFR's persistently <60 mL/min signify possible Chronic Kidney Disease.    Anion gap 9 5 - 15  Glucose,   capillary     Status: Abnormal   Collection Time: 04/04/15  6:20 AM  Result Value Ref Range   Glucose-Capillary 144 (H) 65 - 99 mg/dL   Comment 1 Notify RN    Comment 2 Document in Chart   Glucose, capillary     Status: Abnormal   Collection Time: 04/04/15 11:07 AM  Result Value Ref Range   Glucose-Capillary 223 (H) 65 - 99 mg/dL  Glucose, capillary     Status: Abnormal   Collection Time: 04/04/15  4:24 PM  Result Value Ref Range   Glucose-Capillary 188 (H) 65 - 99 mg/dL  Glucose, capillary     Status: Abnormal   Collection Time: 04/04/15  8:59 PM  Result Value Ref Range   Glucose-Capillary 250 (H) 65 - 99 mg/dL  Glucose, capillary     Status: Abnormal   Collection Time: 04/05/15  7:05 AM  Result Value Ref Range   Glucose-Capillary 147 (H) 65 - 99 mg/dL   Comment 1 Notify RN    Comment 2 Document in Chart   Glucose, capillary     Status: Abnormal   Collection Time: 04/05/15 11:36 AM  Result Value Ref Range   Glucose-Capillary 222 (H) 65 - 99 mg/dL   Comment 1 Notify RN    Comment 2 Document in Chart    No results found.     Medical Problem List and Plan: 1.  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  DVT Prophylaxis/Anticoagulation: Pharmaceutical: Lovenox 3. Pain Management:  Tylenol prn 4. Mood: LCSW to follow for evaluation and support.  5. Neuropsych: This patient is not capable of making decisions onher own behalf. 6. Skin/Wound Care: Routine pressure relief measures. Maintain adequate nutrition and hydration status 7. Fluids/Electrolytes/Nutrition:  Monitor I/O. Check lytes in am.  8. DM type 2:   Home regimen included Invokana 300 mg, Metformin 1000 mg bid, Levemir 40 mg bid and Novolog 20-10-20 ac.  Monitor BS ac/hs and continue Levemir 30 mg bid--add meal coverage and resume low dose metformin bid.   9. HTN: Monitor BP every 8 hours.  Check orthostatic BP in am and slowly resume medications as indicated. Continue coreg bid.   10. Diabetic Neuropathy:  Elavil was dc due to concerns of SE--Continue lyrica. and cymbalta 11. Leucocytosis: WBC on upward trend. Will monitor for fevers and other signs of infection.  12. Hypokalemia:  Will supplement for now and recheck in am.  Likely dilutional due to IVF which were d/c today.  13. Klebsiella UTI: On septra DS-- antibiotic D #4/ 5-7 days   14. OSA: Does not tolerate CPAP 15. Intermittent diarrhea x few months:  No BM for past two days. Will monitor for now.  16. COPD with chronic bronchitis/cough:  Continue nebs prn.   Post Admission Physician Evaluation: 1. 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 Patient is admitted to receive collaborative, interdisciplinary care between the physiatrist, rehab nursing staff, and therapy team. 3. Patient's level of medical complexity and substantial therapy needs in context of that medical necessity cannot be provided at a lesser intensity of care such as a SNF. 4. Patient has experienced substantial functional loss from his/her baseline which was documented above under the "Functional History" and "Functional Status" headings.  Judging by the patient's diagnosis, physical exam, and functional history, the patient has potential for functional progress which will result in measurable gains while on inpatient rehab.  These gains will be of substantial and practical use upon discharge  in facilitating mobility and self-care at the household level. 5. Physiatrist will provide 24 hour management of medical needs as well as oversight of the therapy plan/treatment and provide guidance as appropriate regarding the interaction of the  two. 6. 24 hour rehab nursing will assist with bladder management, bowel management, safety and skin/wound care and help integrate therapy concepts, techniques,education, etc. 7. PT will assess and treat for/with: Lower extremity strength, range of motion, stamina, balance, functional mobility, safety, adaptive techniques and equipment, woundcare, coping skills, pain control, stroke education.   Goals are: Supervision. 8. OT will assess and treat for/with: ADL's, functional mobility, safety, upper extremity strength, adaptive techniques and equipment, wound mgt, ego support, and community reintegration.   Goals are: supervision. Therapy may proceed with showering this patient. 9. SLP will assess and treat for/with: cognition, higher level procession.  Goals are: supervision. 10. Case Management and Social Worker will assess and treat for psychological issues and discharge planning. 11. Team conference will be held weekly to assess progress toward goals and to determine barriers to discharge. 12. Patient will receive at least 3 hours of therapy per day at least 5 days per week. 13. ELOS: 16-19 days.        14. Prognosis:  good   Delice Lesch, MD  04/05/2015

## 2015-04-05 NOTE — [place] FL2 (Signed)
[place] MEDICAID FL2 LEVEL OF CARE SCREENING TOOL     IDENTIFICATION  Patient Name: Casey Harrell Birthdate: 1934-07-19 Sex: female Admission Date (Current Location): 03/31/2015  [facility] [place]   Facility and Address:  [facility] [place], [address], [place], [place] [address]      Provider Number: [id number]  Attending Physician Name and Address:  Marinda ElkAbraham Feliz Ortiz, MD  Relative Name and Phone Number:       Current Level of Care: Hospital Recommended Level of Care: Skilled Nursing Facility Prior Approval Number:    Date Approved/Denied:   PASRR Number: [id number]  Discharge Plan: SNF    Current Diagnoses: Patient Active Problem List   Diagnosis Date Noted  . Orthostatic hypotension   . Hypertensive urgency 04/01/2015  . Fever in adult 04/01/2015  . Acute encephalopathy 04/01/2015  . Confusion   . CVA (cerebral infarction)   . Posterior circulation stroke (HCC)   . Acute ischemic stroke (HCC) 03/31/2015  . Stroke (HCC) 03/31/2015  . CKD (chronic kidney disease) stage 3, GFR 30-59 ml/min 04/17/2014  . Gastroenteritis, non-infectious 04/11/2014  . Nausea vomiting and diarrhea 04/10/2014  . Hypokalemia 04/10/2014  . Dehydration 04/09/2014  . Bronchitis with airway obstruction (HCC) 02/03/2013  . Leukocytosis 02/03/2013  . Fever 02/03/2013  . Acute respiratory failure with hypoxia (HCC) 02/03/2013  . Depression 02/03/2013  . Hyponatremia 02/03/2013  . Obesity (BMI 30-39.9) 01/10/2013  . PVD (peripheral vascular disease)- S/P RCE 06/18/09 12/13/2012  . Sleep apnea- C-pap intol 12/13/2012  . Low risk cardiac nuclear stress test- 2009 and Feb 2011 12/13/2012  . Mild persistent asthma 11/29/2011  . Chronic cough 06/05/2011  . Diabetic neuropathy (HCC) 09/02/2009  . Dyslipidemia 07/13/2009  . Hypothyroidism 11/12/2008  . Type 2 diabetes mellitus, uncontrolled (HCC) 07/15/2008  . Essential hypertension 07/15/2008  .  GERD 07/15/2008  . DIVERTICULOSIS, COLON 07/15/2008    Orientation ACTIVITIES/SOCIAL BLADDER RESPIRATION    Self, Time, Situation, Place  Family supportive Continent Normal  BEHAVIORAL SYMPTOMS/MOOD NEUROLOGICAL BOWEL NUTRITION STATUS   (NONE)  (NONE) Continent Diet (HEART HEALTHY)  PHYSICIAN VISITS COMMUNICATION OF NEEDS Height & Weight Skin    Verbally 5\' 2"  (157.5 cm) 144 lbs. Normal          AMBULATORY STATUS RESPIRATION    Assist extensive Normal      Personal Care Assistance Level of Assistance  Bathing, Dressing Bathing Assistance: Limited assistance   Dressing Assistance: Limited assistance      Functional Limitations Info   (NONE)             SPECIAL CARE FACTORS FREQUENCY  PT (By licensed PT), OT (By licensed OT), Speech therapy     PT Frequency: 2 OT Frequency: 2     Speech Therapy Frequency: 2     Additional Factors Info  Code Status, Allergies Code Status Info: FULL CODE  Allergies Info: Doxycycline Hyclate, Iohexol, Morphine Sulfate, Moxifloxacin           Current Medications (04/05/2015):  This is the current hospital active medication list Current Facility-Administered Medications  Medication Dose Route Frequency Provider Last Rate Last Dose  . 0.9 %  sodium chloride infusion   Intravenous Continuous Marvel PlanJindong Xu, MD 125 mL/hr at 04/02/15 1755    . aspirin EC tablet 81 mg  81 mg Oral Daily Layne BentonSharon L Biby, NP   81 mg at 04/05/15 [phone]  . atorvastatin (LIPITOR) tablet 10 mg  10 mg Oral q1800 Vijaya  Blake Divine, MD   10 mg at 04/04/15 1754  . clopidogrel (PLAVIX) tablet 75 mg  75 mg Oral Daily David L Rinehuls, PA-C   75 mg at 04/05/15 8657  . DULoxetine (CYMBALTA) DR capsule 60 mg  60 mg Oral Daily Kathlen Mody, MD   60 mg at 04/05/15 0821  . enoxaparin (LOVENOX) injection 40 mg  40 mg Subcutaneous Q24H Kathlen Mody, MD   40 mg at 04/04/15 2101  . hydrocortisone (ANUSOL-HC) suppository 25 mg  25 mg Rectal BID Kathlen Mody, MD   25 mg at 04/04/15 2102   . insulin aspart (novoLOG) injection 0-5 Units  0-5 Units Subcutaneous QHS Kathlen Mody, MD   2 Units at 04/04/15 2113  . insulin aspart (novoLOG) injection 0-9 Units  0-9 Units Subcutaneous TID WC Kathlen Mody, MD   1 Units at 04/05/15 0730  . insulin detemir (LEVEMIR) injection 30 Units  30 Units Subcutaneous BID Marinda Elk, MD   30 Units at 04/05/15 [phone]  . levothyroxine (SYNTHROID, LEVOTHROID) tablet 50 mcg  50 mcg Oral QAC breakfast Kathlen Mody, MD   50 mcg at 04/05/15 6295  . meclizine (ANTIVERT) tablet 12.5 mg  12.5 mg Oral Q6H PRN Marinda Elk, MD      . ondansetron (ZOFRAN-ODT) disintegrating tablet 4 mg  4 mg Oral Q8H PRN Marinda Elk, MD      . pantoprazole (PROTONIX) EC tablet 40 mg  40 mg Oral Daily Kathlen Mody, MD   40 mg at 04/05/15 2841  . pregabalin (LYRICA) capsule 150 mg  150 mg Oral Daily Kathlen Mody, MD   150 mg at 04/05/15 0820  . sulfamethoxazole-trimethoprim (BACTRIM DS,SEPTRA DS) 800-160 MG per tablet 1 tablet  1 tablet Oral Q12H Marinda Elk, MD   1 tablet at 04/05/15 0820     Discharge Medications: Please see discharge summary for a list of discharge medications.  Relevant Imaging Results:  Relevant Lab Results:  Recent Labs    Additional Information SSN# [id number]  Derenda Fennel, MSW, LCSWA [phone] 04/05/2015 10:49 AM

## 2015-04-05 NOTE — Progress Notes (Signed)
Rehab admissions - I met with patient and her husband.  They would like inpatient rehab.  I have faxed information to [facility] requesting inpatient rehab admission.  I will follow up after I hear back from insurance case manager.  Call me for questions.  #[phone]

## 2015-04-05 NOTE — Progress Notes (Addendum)
Inpatient Diabetes Program Recommendations  AACE/ADA: New Consensus Statement on Inpatient Glycemic Control (2015)  Target Ranges:  Prepandial:   less than 140 mg/dL      Peak postprandial:   less than 180 mg/dL (1-2 hours)      Critically ill patients:  140 - 180 mg/dL   Results for Atilano MedianFAGG, Wenda F (MRN [id number]) as of 04/05/2015 12:39  Ref. Range 04/04/2015 06:20 04/04/2015 11:07 04/04/2015 16:24 04/04/2015 20:59 04/05/2015 07:05 04/05/2015 11:36  Glucose-Capillary Latest Ref Range: 65-99 mg/dL 578144 (H) 469223 (H) 629188 (H) 250 (H) 147 (H) 222 (H)   Review of Glycemic Control  Diabetes history: DM 2 Outpatient Diabetes medications: Invokana 300 mg Daily, Metformin 1,000 mg BID, Levemir 40 units BID, Novolog 20-10-20 TID meal coverage Current orders for Inpatient glycemic control: Levemir 30 units BID, Novolog Sensitive + HS scale  If patient remains inpatient:  Inpatient Diabetes Program Recommendations: Insulin - Meal Coverage: Patient takes Novolog 20 units breakfast, 10 units lunch, 20 units supper for meal coverage at home. Please consider ordering Novolog 5 units TID meal coverage in addition to correction scale.  Thanks,  Christena DeemShannon Denesha Brouse RN, MSN, [facility] Inpatient Diabetes Coordinator Team Pager [phone] (8a-5p)

## 2015-04-05 NOTE — Clinical Social Work Note (Signed)
CSW met with patient and pt's husband present at bedside in regards to SNF placement. Pt's husband NOT pleased with patient going to SNF vs CIR. Pt's husband expressed his concerns and later reported that if patient could not go to CIR family would prefers  [facility].  CSW extended bed offers and patient has a bed at [facility]. CSW notified that patient was approved for CIR/ IP REHAB.  Pt and family are agreeable to CIR and will be admitted today, 11/28.  Clinical Social Worker will sign off for now as social work intervention is no longer needed. Please consult [place] again if new need arises.  Glendon Axe, MSW, LCSWA [phone] 04/05/2015 3:30 PM

## 2015-04-06 ENCOUNTER — Inpatient Hospital Stay (HOSPITAL_COMMUNITY): Payer: Medicare Other | Admitting: Physical Therapy

## 2015-04-06 ENCOUNTER — Inpatient Hospital Stay (HOSPITAL_COMMUNITY): Payer: Medicare Other | Admitting: Occupational Therapy

## 2015-04-06 ENCOUNTER — Inpatient Hospital Stay (HOSPITAL_COMMUNITY): Payer: Medicare Other | Admitting: Speech Pathology

## 2015-04-06 DIAGNOSIS — I69319 Unspecified symptoms and signs involving cognitive functions following cerebral infarction: Secondary | ICD-10-CM

## 2015-04-06 DIAGNOSIS — G8190 Hemiplegia, unspecified affecting unspecified side: Secondary | ICD-10-CM

## 2015-04-06 LAB — GLUCOSE, CAPILLARY
Glucose-Capillary: 215 mg/dL — ABNORMAL HIGH (ref 65–99)
Glucose-Capillary: 212 mg/dL — ABNORMAL HIGH (ref 65–99)
Glucose-Capillary: 116 mg/dL — ABNORMAL HIGH (ref 65–99)
Glucose-Capillary: 203 mg/dL — ABNORMAL HIGH (ref 65–99)

## 2015-04-06 MED ORDER — INSULIN DETEMIR 100 UNIT/ML ~~LOC~~ SOLN
32.0000 [IU] | Freq: Two times a day (BID) | SUBCUTANEOUS | Status: DC
  Administered 2015-04-06 – 2015-04-07 (×2): 32 [IU] via SUBCUTANEOUS
  Filled 2015-04-06 (×3): qty 0.32

## 2015-04-06 NOTE — Care Management Note (Signed)
Inpatient Rehabilitation Center Individual Statement of Services  Patient Name:  Casey Harrell  Date:  04/06/2015  Welcome to the Inpatient Rehabilitation Center.  Our goal is to provide you with an individualized program based on your diagnosis and situation, designed to meet your specific needs.  With this comprehensive rehabilitation program, you will be expected to participate in at least 3 hours of rehabilitation therapies Monday-Friday, with modified therapy programming on the weekends.  Your rehabilitation program will include the following services:  Physical Therapy (PT), Occupational Therapy (OT), Speech Therapy (ST), 24 hour per day rehabilitation nursing, Therapeutic Recreaction (TR), Neuropsychology, Case Management (Social Worker), Rehabilitation Medicine, Nutrition Services and Pharmacy Services  Weekly team conferences will be held on Wednesday to discuss your progress.  Your Social Worker will talk with you frequently to get your input and to update you on team discussions.  Team conferences with you and your family in attendance may also be held.  Expected length of stay: 16-18 days  Overall anticipated outcome: supervision cueing/ min-tub and stairs  Depending on your progress and recovery, your program may change. Your Social Worker will coordinate services and will keep you informed of any changes. Your Social Worker's name and contact numbers are listed  below.  The following services may also be recommended but are not provided by the Inpatient Rehabilitation Center:   Driving Evaluations  Home Health Rehabiltiation Services  Outpatient Rehabilitation Services    Arrangements will be made to provide these services after discharge if needed.  Arrangements include referral to agencies that provide these services.  Your insurance has been verified to be:  [employer] Your primary doctor is:  Government social research officerBruce Burchette  Pertinent information will be shared with your doctor and  your insurance company.  Social Worker:  Dossie DerBecky Lucresia Simic, SW [phone] or ([phone]  Information discussed with and copy given to patient by: [name], [name], 04/06/2015, 10:17 AM

## 2015-04-06 NOTE — Progress Notes (Signed)
Casey Diones, RN Rehab Admission Coordinator Signed Physical Medicine and Rehabilitation PMR Pre-admission 04/05/2015 3:04 PM  Related encounter: ED to Hosp-Admission (Discharged) from 03/31/2015 in [place] Collapse All   PMR Admission Coordinator Pre-Admission Assessment  Patient: Casey Harrell is an 80 y.o., female MRN: [id number] DOB: Feb 11, 1935 Height: _0  (157.5 cm) Weight: 65.4 kg (144 lb 2.9 oz)  Insurance Information HMO: Yes PPO: PCP: IPA: 80/20: OTHER: Group # [id number] PRIMARY: BCBS Medicare Policy#: [id number] Subscriber: [name] CM Name: Limmie Patricia Phone#: [phone] Fax#: [phone] Pre-Cert#: Employer: Retired Benefits: Phone #: [phone] Name: Automated Eff. 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 female with a history of dementia admitted on 11/23 with fever, confusion. MRI revealed acute infarcts involving the left periatrial white matter  and left temporal-parietal region in addition to chronic lacunar and bilateral occipital infarcts. Pt noted to have acute neuro changes while sitting up in chair on 11/25 with aphasia, right upper > lower hemiparesis. Symptoms improved somewhat with bedrest and IVF for relative orthostasis. No acute findings on head CT but repeat MRI revealed interval extension of left periatrial and temporal-parietal infarcts with new infarcts involving the left temporal-parietal and occipital lobe. Pt was diagnosed with a klebsiella UTI for which she was started on rocephin.    Total: 5=NIH  Past Medical History  Past Medical History  Diagnosis Date  . DIABETES-TYPE 2 07/15/2008  . DIABETIC PERIPHERAL NEUROPATHY 09/02/2009  . HYPERTENSION 07/15/2008  . ASTHMA 07/15/2008  . GERD 07/15/2008  . PVD (peripheral vascular disease) ([place]) 06/18/09    RCE  . Normal cardiac stress test     low risk Nuc 2009, Feb 2011  . COPD (chronic obstructive pulmonary disease) (HCC)     Family History  family history includes Arthritis in her mother; Asthma in her sister; Cancer in her paternal grandfather; Cancer (age of onset: 60) in her brother; Celiac disease in her sister; Diabetes in her mother; Heart disease in her brother, father, mother, sister, and sister; Stroke in her maternal grandfather.  Prior Rehab/Hospitalizations: No previous rehab admissions.  Has the patient had major surgery during 100 days prior to admission? No  Current Medications   Current facility-administered medications:  . 0.9 % sodium chloride infusion, , Intravenous, Continuous, Rosalin Hawking, MD, Last Rate: 125 mL/hr at 04/02/15 1755 . aspirin EC tablet 81 mg, 81 mg, Oral, Daily, Donzetta Starch, NP, 81 mg at 04/05/15 9470 . atorvastatin (LIPITOR) tablet 10 mg, 10 mg, Oral, q1800, Hosie Poisson, MD, 10 mg at 04/04/15 1754 . clopidogrel (PLAVIX) tablet 75 mg, 75 mg, Oral, Daily, Early Chars Rinehuls, PA-C, 75 mg  at 04/05/15 7615 . DULoxetine (CYMBALTA) DR capsule 60 mg, 60 mg, Oral, Daily, Hosie Poisson, MD, 60 mg at 04/05/15 0821 . enoxaparin (LOVENOX) injection 40 mg, 40 mg,  Subcutaneous, Q24H, Hosie Poisson, MD, 40 mg at 04/04/15 2101 . hydrocortisone (ANUSOL-HC) suppository 25 mg, 25 mg, Rectal, BID, Hosie Poisson, MD, 25 mg at 04/05/15 1017 . insulin aspart (novoLOG) injection 0-5 Units, 0-5 Units, Subcutaneous, QHS, Hosie Poisson, MD, 2 Units at 04/04/15 2113 . insulin aspart (novoLOG) injection 0-9 Units, 0-9 Units, Subcutaneous, TID WC, Hosie Poisson, MD, 3 Units at 04/05/15 1417 . insulin detemir (LEVEMIR) injection 30 Units, 30 Units, Subcutaneous, BID, Charlynne Cousins, MD, 30 Units at 04/05/15 [phone] . levothyroxine (SYNTHROID, LEVOTHROID) tablet 50 mcg, 50 mcg, Oral, QAC breakfast, Hosie Poisson, MD, 50 mcg at 04/05/15 0821 . meclizine (ANTIVERT) tablet 12.5 mg, 12.5 mg, Oral, Q6H PRN, Charlynne Cousins, MD . ondansetron (ZOFRAN-ODT) disintegrating tablet 4 mg, 4 mg, Oral, Q8H PRN, Charlynne Cousins, MD . pantoprazole (PROTONIX) EC tablet 40 mg, 40 mg, Oral, Daily, Hosie Poisson, MD, 40 mg at 04/05/15 0821 . pregabalin (LYRICA) capsule 150 mg, 150 mg, Oral, Daily, Hosie Poisson, MD, 150 mg at 04/05/15 0820 . sulfamethoxazole-trimethoprim (BACTRIM DS,SEPTRA DS) 800-160 MG per tablet 1 tablet, 1 tablet, Oral, Q12H, Charlynne Cousins, MD, 1 tablet at 04/05/15 0820  Patients Current Diet: Diet Heart Room service appropriate?: Yes; Fluid consistency:: Thin Diet - low sodium heart healthy  Precautions / Restrictions Precautions Precautions: Fall Restrictions Weight Bearing Restrictions: No   Has the patient had 2 or more falls or a fall with injury in the past year?No  Prior Activity Level Community (5-7x/wk): Went out daily. Was driving.  Home Assistive Devices / Equipment Home Assistive Devices/Equipment: Eyeglasses, Environmental consultant (specify type), Kasandra Knudsen (specify quad or  straight) Home Equipment: Cane - single point, Walker - 2 wheels, Shower seat - built in  Prior Device Use: Indicate devices/aids used by the patient prior to current illness, exacerbation or injury? Rolling Walker  Prior Functional Level Prior Function Level of Independence: Independent with assistive device(s) Comments: husband assists her  Self Care: Did the patient need help bathing, dressing, using the toilet or eating? Independent  Indoor Mobility: Did the patient need assistance with walking from room to room (with or without device)? Independent  Stairs: Did the patient need assistance with internal or external stairs (with or without device)? Independent  Functional Cognition: Did the patient need help planning regular tasks such as shopping or remembering to take medications? Independent  Current Functional Level Cognition  Arousal/Alertness: Awake/alert Overall Cognitive Status: Impaired/Different from baseline Current Attention Level: Selective Orientation Level: Oriented to person, Oriented to time, Oriented to place, Oriented to situation Following Commands: Follows one step commands with increased time, Follows multi-step commands inconsistently Safety/Judgement: Decreased awareness of safety, Decreased awareness of deficits General Comments: During interview process with home living enviornment and equipment pt incorrectly asnwering questions, husband present to correctly answer questions Attention: Sustained Sustained Attention: Appears intact Memory: Impaired Memory Impairment: Retrieval deficit (baseline) Awareness: Impaired Awareness Impairment: Emergent impairment Problem Solving: Impaired Problem Solving Impairment: Functional basic Safety/Judgment: Impaired   Extremity Assessment (includes Sensation/Coordination)  Upper Extremity Assessment: RUE deficits/detail RUE Deficits / Details: decreased strength throughout RUE, strength grossly 2/5 RUE  Sensation: decreased light touch, decreased proprioception RUE Coordination: decreased fine motor, decreased gross motor  Lower Extremity Assessment: Defer to PT evaluation    ADLs  Overall ADL's : Needs assistance/impaired Eating/Feeding: Moderate assistance, Sitting Grooming: Moderate assistance, Sitting Upper Body Bathing: Maximal assistance, Sitting Lower Body Bathing: Maximal assistance, Sit to/from stand Upper Body Dressing : Maximal assistance, Sitting Lower Body Dressing: Maximal assistance, Sit to/from stand  Toilet Transfer: Moderate assistance, Cueing for safety, Stand-pivot, BSC Toileting- Clothing Manipulation and Hygiene: Total assistance Functional mobility during ADLs: Moderate assistance General ADL Comments: Pt lethargic during session. Pt requires mod to total assist due to weakness throughout RUE and decreased overall cognition.     Mobility  Overal bed mobility: Needs Assistance Bed Mobility: Supine to Sit Supine to sit: Mod assist General bed mobility comments: mainly needs assist under trunk and to finish scooting to edge of bed, cues for management of RUE    Transfers  Overall transfer level: Needs assistance Equipment used: 1 person hand held assist Transfers: Sit to/from Stand, Stand Pivot Transfers Sit to Stand: Mod assist Stand pivot transfers: Mod assist General transfer comment: Mod assist to assist with lift/lower from recliner. Cues for safety.     Ambulation / Gait / Stairs / Wheelchair Mobility  Ambulation/Gait General Gait Details: unable to do more than transfer, did pass out this am    Posture / Balance Balance Overall balance assessment: Needs assistance Sitting-balance support: No upper extremity supported, Feet supported Sitting balance-Leahy Scale: Fair Postural control: Posterior lean Standing balance support: No upper extremity supported Standing balance-Leahy Scale: Poor    Special needs/care consideration  BiPAP/CPAP No CPM No Continuous Drip IV 0.9% NS 125 ml/hr Dialysis No  Life Vest No Oxygen No Special Bed No Trach Size No Wound Vac (area) No  Skin No  Bowel mgmt: Last BM 04/02/15 Bladder mgmt: Wears depends, has urgency incontinence Diabetic mgmt Yes, on insulin at home    Previous Home Environment Living Arrangements: Spouse/significant other Available Help at Discharge: Family, Available 24 hours/day Type of Home: House Home Layout: One level Home Access: Stairs to enter Technical brewer of Steps: 3 Bathroom Shower/Tub: Gaffer, Charity fundraiser: Programmer, systems: Yes Home Care Services: No Additional Comments: husband works during the day  [place] for Discharge Living Setting: Patient's home, House, Lives with (comment) (Lives with husband.) Type of Home at Discharge: House Discharge Home Layout: One level Discharge Home Access: Stairs to enter Entrance Stairs-Number of Steps: 3 Does the patient have any problems obtaining your medications?: No  Social/Family/Support Systems Patient Roles: Spouse, Parent, Other (Comment) (Has a husband, son and grand daughter.) Contact Information: Marria Mathison - husband [phone] Anticipated Caregiver: Rozanna Boer daughter during the day Ability/Limitations of Caregiver: Husband works parttime 7 am to 3 pm. 33 yo grand daughter to stay with patient while husband works. Caregiver Availability: 24/7 Discharge Plan Discussed with Primary Caregiver: Yes Is Caregiver In Agreement with Plan?: Yes Does Caregiver/Family have Issues with Lodging/Transportation while Pt is in Rehab?: No  Goals/Additional Needs Patient/Family Goal for Rehab: PT supervision, OT supervision to min assist, ST supervision goals Expected length of stay: 13-20 days Cultural Considerations: [employer] Dietary Needs: Heart diet, thin liquids Equipment Needs: TBD Pt/Family  Agrees to Admission and willing to participate: Yes Program Orientation Provided & Reviewed with Pt/Caregiver Including Roles & Responsibilities: Yes  Decrease burden of Care through IP rehab admission: N/A  Possible need for SNF placement upon discharge: Not anticipated  Patient Condition: This patient's medical and functional status has changed since the consult dated: 04/03/15 in which the Rehabilitation Physician determined and documented that the patient's condition is appropriate for intensive rehabilitative care in an inpatient rehabilitation facility. See "History of Present Illness" (above) for medical update. Functional changes are: Currently requiring mod assist for stand pivot transfers. Patient's medical and functional status update has been discussed with the Rehabilitation physician and  patient remains appropriate for inpatient rehabilitation. Will admit to inpatient rehab today.  Preadmission Screen Completed By: Casey Harrell, 04/05/2015 3:46 PM ______________________________________________________________________  Discussed status with Dr. Posey Pronto on 04/05/15 at 1526 and received telephone approval for admission today.  Admission Coordinator: Casey Harrell time1547/Date11/28/16          Cosigned by: Ankit Lorie Phenix, MD at 04/05/2015 4:11 PM  Revision History     Date/Time User Provider Type Action   04/05/2015 4:11 PM Ankit Lorie Phenix, MD Physician Cosign   04/05/2015 3:47 PM Casey Diones, RN Rehab Admission Coordinator Sign

## 2015-04-06 NOTE — Progress Notes (Signed)
Patient information reviewed and entered into eRehab system by Evin Chirco, RN, CRRN, PPS Coordinator.  Information including medical coding and functional independence measure will be reviewed and updated through discharge.     Per nursing patient was given "Data Collection Information Summary for Patients in Inpatient Rehabilitation Facilities with attached "Privacy Act Statement-Health Care Records" upon admission.  

## 2015-04-06 NOTE — Progress Notes (Signed)
Casey Harrell is a 80 y.o. female with a history of dementia admitted on 11/23 with fever, 2 day history of diarrhea and confusion. MRI revealed acute infarcts involving the left periatrial white matter and left temporal-parietal region in addition to chronic lacunar and bilateral occipital infarcts. Pt noted to have acute neuro changes and had syncopal episode 11/25 with resultant aphasia, right upper > lower hemiparesis. Symptoms improved somewhat with bedrest and IVF for relative orthostasis. No acute findings on head CT but repeat MRI revealed interval extension of left periatrial and temporal-parietal infarcts with new infarcts involving the left temporal-parietal and occipital lobe most likely from hypotensive episode in setting of high grade stenosis L-M1 and/or P2 segments. MRA brain revealed high grade stenosis proximal left M1 segment with extensive atheroma throughout posterior circulation and focal severe stenosis Left V4 segment  Subjective/Complaints:   Objective: Vital Signs: Blood pressure 134/67, pulse 81, temperature 98.6 F (37 C), temperature source Oral, resp. rate 18, height 5\' 1"  (1.549 m), weight 67.4 kg (148 lb 9.4 oz), SpO2 98 %. No results found. Results for orders placed or performed during the hospital encounter of 04/05/15 (from the past 72 hour(s))  Glucose, capillary     Status: Abnormal   Collection Time: 04/05/15  9:06 PM  Result Value Ref Range   Glucose-Capillary 275 (H) 65 - 99 mg/dL   Comment 1 Notify RN   Glucose, capillary     Status: Abnormal   Collection Time: 04/06/15  6:26 AM  Result Value Ref Range   Glucose-Capillary 215 (H) 65 - 99 mg/dL   Comment 1 Notify RN      HEENT: normal Cardio: RRR and no murmur Resp: CTA B/L and unlabored GI: BS positive and NT, ND Extremity:  No Edema Skin:   Intact Neuro: Alert/Oriented, Abnormal Sensory reduced LT, proprio RUE, Abnormal Motor 2- R delt bi tri grip, 4/5 RLE, 5/5 on left side, Abnormal FMC Ataxic/  dec FMC, Inattention and Other R HH Musc/Skel:  Other no pain with UE or LE ROM Gen NAD   Assessment/Plan: 1. Functional deficits secondary to Right hemiparesis, Right homonymous hemianopsia, cognitive deficits related to CVA which require 3+ hours per day of interdisciplinary therapy in a comprehensive inpatient rehab setting. Physiatrist is providing close team supervision and 24 hour management of active medical problems listed below. Physiatrist and rehab team continue to assess barriers to discharge/monitor patient progress toward functional and medical goals. FIM:             Function - Chair/bed transfer Chair/bed transfer method: Stand pivot Chair/bed transfer assist level: Maximal assist (Pt 25 - 49%/lift and lower) Chair/bed transfer assistive device: Armrests Chair/bed transfer details: Manual facilitation for weight shifting, Verbal cues for technique, Verbal cues for sequencing, Verbal cues for precautions/safety  Function - Locomotion: Wheelchair Will patient use wheelchair at discharge?: Yes Type: Manual Function - Locomotion: Ambulation Assistive device: Hand held assist Max distance: 3 ft Assist level: 2 helpers Walk 10 feet activity did not occur: Safety/medical concerns Walk 50 feet with 2 turns activity did not occur: Safety/medical concerns Walk 150 feet activity did not occur: Safety/medical concerns Walk 10 feet on uneven surfaces activity did not occur: Safety/medical concerns  Function - Comprehension Comprehension: Auditory  Function - Expression Expression: Verbal  Function - Social Interaction Social Interaction assist level: Interacts appropriately 90% of the time - Needs monitoring or encouragement for participation or interaction.  Function - Problem Solving Problem solving assist level: Solves basic 90% of  the time/requires cueing < 10% of the time  Function - Memory Memory assist level: More than reasonable amount of time Patient  normally able to recall (first 3 days only): Current season, That he or she is in a hospital  Medical Problem List and Plan: 1.  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  DVT Prophylaxis/Anticoagulation: Pharmaceutical: Lovenox 3. Pain Management:  Tylenol prn 4. Mood: LCSW to follow for evaluation and support.   5. Neuropsych: This patient is not capable of making decisions onher own behalf. 6. Skin/Wound Care: Routine pressure relief measures. Maintain adequate nutrition and hydration status 7. Fluids/Electrolytes/Nutrition:  Monitor I/O. Check lytes in am.   8. DM type 2:   Home regimen included Invokana 300 mg, Metformin 1000 mg bid, Levemir 40 mg bid and Novolog 20-10-20 ac. Monitor BS ac/hs and continue Levemir 30 mg bid--add meal coverage and resume low dose metformin bid. am CBG eleated increase levimir   9. HTN: Monitor BP every 8 hours.  Check orthostatic BP in am and slowly resume medications as indicated. Continue coreg bid.    10. Diabetic Neuropathy:  Elavil was dc due to concerns of SE--Continue lyrica. and cymbalta 11. Leucocytosis: WBC on upward trend. Will monitor for fevers and other signs of infection.   12. Hypokalemia:  Will supplement for now and recheck in am.  Likely dilutional due to IVF which were d/c today.   13. Klebsiella UTI: On septra DS-- antibiotic D #5/ 5 days   14. OSA: Does not tolerate CPAP 15. Intermittent diarrhea x few months:  No BM for past two days. Will monitor for now.   16. COPD with chronic bronchitis/cough:lung exam normal today  Continue nebs prn.   LOS (Days) 1 A FACE TO FACE EVALUATION WAS PERFORMED  KIRSTEINS,ANDREW E 04/06/2015, 9:02 AM

## 2015-04-06 NOTE — Evaluation (Signed)
Occupational Therapy Assessment and Plan  Patient Details  Name: Casey Harrell MRN: [id number] Date of Birth: 13-Jan-1935  OT Diagnosis: abnormal posture, cognitive deficits, hemiplegia affecting dominant side and muscle weakness (generalized) Rehab Potential: Rehab Potential (ACUTE ONLY): Good ELOS: 16-19 days   Today's Date: 04/06/2015 OT Individual Time: 1400-1500 OT Individual Time Calculation (min): 60 min     Problem List:  Patient Active Problem List   Diagnosis Date Noted  . Embolic stroke involving posterior cerebral artery ([place]) 04/05/2015  . Orthostatic hypotension   . Hypertensive urgency 04/01/2015  . Fever in adult 04/01/2015  . Acute encephalopathy 04/01/2015  . Confusion   . CVA (cerebral infarction)   . Posterior circulation stroke ([place])   . Acute ischemic stroke ([place]) 03/31/2015  . Stroke ([place]) 03/31/2015  . CKD (chronic kidney disease) stage 3, GFR 30-59 ml/min 04/17/2014  . Gastroenteritis, non-infectious 04/11/2014  . Nausea vomiting and diarrhea 04/10/2014  . Hypokalemia 04/10/2014  . Dehydration 04/09/2014  . Bronchitis with airway obstruction ([place]) 02/03/2013  . Leukocytosis 02/03/2013  . Fever 02/03/2013  . Acute respiratory failure with hypoxia ([place]) 02/03/2013  . Depression 02/03/2013  . Hyponatremia 02/03/2013  . Obesity (BMI 30-39.9) 01/10/2013  . PVD (peripheral vascular disease)- S/P RCE 06/18/09 12/13/2012  . Sleep apnea- C-pap intol 12/13/2012  . Low risk cardiac nuclear stress test- 2009 and Feb 2011 12/13/2012  . Mild persistent asthma 11/29/2011  . Chronic cough 06/05/2011  . Diabetic neuropathy ([place]) 09/02/2009  . Dyslipidemia 07/13/2009  . Hypothyroidism 11/12/2008  . Type 2 diabetes mellitus, uncontrolled ([place]) 07/15/2008  . Essential hypertension 07/15/2008  . GERD 07/15/2008  . DIVERTICULOSIS, COLON 07/15/2008    Past Medical History:  Past Medical History  Diagnosis Date  . DIABETES-TYPE 2 07/15/2008  . DIABETIC PERIPHERAL  NEUROPATHY 09/02/2009  . HYPERTENSION 07/15/2008  . ASTHMA 07/15/2008  . GERD 07/15/2008  . PVD (peripheral vascular disease) ([place]) 06/18/09    RCE  . Normal cardiac stress test     low risk Nuc 2009, Feb 2011  . COPD (chronic obstructive pulmonary disease) ([place])   . Vertigo    Past Surgical History:  Past Surgical History  Procedure Laterality Date  . Appendectomy  1971  . Abdominal hysterectomy  1971  . Tonsillectomy    . Tubal ligation    . Cesarean section    . Flexible sigmoidoscopy      diverticulitis  . Laparotomy      X 3 with adhesions lysis  . Foot surgery      right foot X 2  . Carotid endarterectomy  06/18/09    RCE    Assessment & Plan Clinical Impression: Patient is a 80 y.o. year old female with history of dementia admitted on 11/23 with fever, 2 day history of diarrhea and confusion. MRI revealed acute infarcts involving the left periatrial white matter and left temporal-parietal region in addition to chronic lacunar and bilateral occipital infarcts. Pt noted to have acute neuro changes and had syncopal episode 11/25 with resultant aphasia, right upper > lower hemiparesis. Symptoms improved somewhat with bedrest and IVF for relative orthostasis. No acute findings on head CT but repeat MRI revealed interval extension of left periatrial and temporal-parietal infarcts with new infarcts involving the left temporal-parietal and occipital lobe most likely from hypotensive episode in setting of high grade stenosis L-M1 and/or P2 segments. MRA brain revealed high grade stenosis proximal left M1 segment with extensive atheroma throughout posterior circulation and focal severe stenosis Left V4  segment. Carotid dopplers without significant ICA stenosis. 2 D echo with EF 60-65% with grade 2 diastolic dysfunction, atrial septal lipomatous hypertrophy and calcified MV with trivial regurgitation.   She was started on rocephin for Klebsiella UTI and mentation has improved. EEG revealed  mild slowing and no seizure activity. Neurology felt that left punctate strokes likely embolic due to unknown source and given MI stenosis recommended ASA and plavix X 3 months followed by Plavix alone. Dr. Erlinda Hong recommends BP goal 130-160 and blood pressure medication adjusted. Patient continues to be limited by RUE weakness with decreased balance, unsteady gait, lacks awareness and insight into deficits and mild fluent aphasia complicated by baseline dementia. CIR recommended by MD and rehab team for follow up therapy. .  Patient transferred to CIR on 04/05/2015 .    Patient currently requires mod with basic self-care skills secondary to muscle weakness, impaired timing and sequencing, unbalanced muscle activation, decreased coordination and decreased motor planning, decreased attention to right, decreased awareness, decreased problem solving, decreased safety awareness and decreased memory and decreased standing balance, decreased postural control, hemiplegia and decreased balance strategies.  Prior to hospitalization, patient could complete ADLs with modified independent .  Patient will benefit from skilled intervention to decrease level of assist with basic self-care skills prior to discharge home with care partner.  Anticipate patient will require 24 hour supervision and follow up home health.  OT - End of Session Activity Tolerance: Tolerates 30+ min activity with multiple rests Endurance Deficit: Yes Endurance Deficit Description: requires seated rest breaks OT Assessment Rehab Potential (ACUTE ONLY): Good OT Patient demonstrates impairments in the following area(s): Balance;Cognition;Endurance;Motor;Perception;Safety;Sensory OT Basic ADL's Functional Problem(s): Eating;Grooming;Bathing;Dressing;Toileting OT Advanced ADL's Functional Problem(s): Simple Meal Preparation OT Transfers Functional Problem(s): Toilet;Tub/Shower OT Additional Impairment(s): Fuctional Use of Upper Extremity OT  Plan OT Intensity: Minimum of 1-2 x/day, 45 to 90 minutes OT Frequency: 5 out of 7 days OT Duration/Estimated Length of Stay: 16-19 days OT Treatment/Interventions: Balance/vestibular training;Cognitive remediation/compensation;Discharge planning;Disease mangement/prevention;DME/adaptive equipment instruction;Functional electrical stimulation;Functional mobility training;Neuromuscular re-education;Pain management;Patient/family education;Psychosocial support;Self Care/advanced ADL retraining;Therapeutic Activities;Therapeutic Exercise;UE/LE Strength taining/ROM;UE/LE Coordination activities;Wheelchair propulsion/positioning OT Self Feeding Anticipated Outcome(s): Supervision/setup OT Basic Self-Care Anticipated Outcome(s): Supervision/setup OT Toileting Anticipated Outcome(s): Supervision/setup OT Bathroom Transfers Anticipated Outcome(s): Supervision- Min assist OT Recommendation Patient destination: Home Follow Up Recommendations: Home health OT;24 hour supervision/assistance Equipment Recommended: Tub/shower seat;To be determined   Skilled Therapeutic Intervention OT eval completed with discussion of rehab process, OT purpose, goals, and ELOS.  Pt already dressed and declined bathing and dressing at this time.  Engaged in functional transfers in ADL apt with performing toilet transfer and toileting with mod assist and pt requiring assist to pull pants over hips post toileting.  Pt required frequent seated rest breaks due to feeling "unsteady" and "weak".  Performed simulated walk-in shower transfer over 3" ledge with pt able to complete transfer with mod assist, unable to exit shower due to BLE giving out and needing to return to seated position.  Positioned w/c over simulated ledge and performed stand pivot to w/c with mod assist.  Pt with improved RUE movement during functional tasks as compared to formal testing.    OT Evaluation Precautions/Restrictions  Precautions Precautions:  Fall General   Vital Signs Therapy Vitals Temp: 98.2 F (36.8 C) Temp Source: Oral Pulse Rate: 75 Resp: 18 BP: 117/61 mmHg Patient Position (if appropriate): Sitting Oxygen Therapy SpO2: 93 % O2 Device: Not Delivered Pain Pain Assessment Pain Assessment: No/denies pain Home Living/Prior Functioning Home Living Living  Arrangements: Spouse/significant other Available Help at Discharge: Family, Available 24 hours/day (80 yo grand daughter can help during the day) Type of Home: House Home Access: Stairs to enter [employer] of Steps: 3 Entrance Stairs-Rails: Left Home Layout: One level Bathroom Shower/Tub: Gaffer, Charity fundraiser: Standard Bathroom Accessibility: Yes Additional Comments: husband works during the day  Lives With: Spouse IADL History Homemaking Responsibilities: Yes Meal Prep Responsibility: Secondary Laundry Responsibility: Secondary Shopping Responsibility: Secondary Prior Function Level of Independence: Independent with basic ADLs, Independent with gait, Independent with transfers  Able to Take Stairs?: Yes Driving: Yes Vocation: Retired Leisure: Hobbies-yes (Comment) Comments: likes to work in the yard ADL  See Function Navigator Vision/Perception  Vision- History Baseline Vision/History: Wears glasses Wears Glasses: Reading only Patient Visual Report: Nausea/blurring vision with head movement Vision- Assessment Vision Assessment?: Yes Eye Alignment: Within Functional Limits Ocular Range of Motion: Within Functional Limits Tracking/Visual Pursuits: Requires cues, head turns, or add eye shifts to track Saccades: Additional eye shifts occurred during testing;Additional head turns occurred during testing  Cognition Overall Cognitive Status: History of cognitive impairments - at baseline (baseline dementia) Arousal/Alertness: Awake/alert Orientation Level: Person Year: Other (Comment) (1916) Month: November Day of Week:  Correct Memory: Impaired Memory Impairment: Retrieval deficit Immediate Memory Recall: Sock;Blue;Bed Memory Recall: Sock;Bed;Blue Memory Recall Sock: Without Cue Memory Recall Blue: Without Cue Memory Recall Bed: Without Cue Attention: Sustained Sustained Attention: Appears intact Awareness: Impaired Awareness Impairment: Emergent impairment Problem Solving: Impaired Problem Solving Impairment: Functional basic Safety/Judgment: Impaired Sensation Sensation Light Touch: Appears Intact Stereognosis: Not tested Hot/Cold: Appears Intact Proprioception: Impaired by gross assessment (impaired RUE) Coordination Gross Motor Movements are Fluid and Coordinated: No Fine Motor Movements are Fluid and Coordinated: No Finger Nose Finger Test: mild ataxia on Lt, unable to complete on Rt Heel Shin Test: mild impairment BLE, possibly due to hip weakness/decreased hip ROM Extremity/Trunk Assessment RUE Assessment RUE Assessment: Exceptions to [facility] RUE AROM (degrees) RUE Overall AROM Comments: shoulder flexion approx 20 degrees, moderate internal rotation, minimal active 3rd and 4th digit movement with grasp/release RUE PROM (degrees) RUE Overall PROM Comments: WFL RUE Strength RUE Overall Strength Comments: deltoid 2/5, bicep/tricep 2/5, wrist 0/5 LUE Assessment LUE Assessment: Within Functional Limits   See Function Navigator for Current Functional Status.   Refer to Care Plan for Long Term Goals  Recommendations for other services: None  Discharge Criteria: Patient will be discharged from OT if patient refuses treatment 3 consecutive times without medical reason, if treatment goals not met, if there is a change in medical status, if patient makes no progress towards goals or if patient is discharged from hospital.  The above assessment, treatment plan, treatment alternatives and goals were discussed and mutually agreed upon: by patient and by family  [facility] 04/06/2015, 3:22 PM

## 2015-04-06 NOTE — Evaluation (Signed)
Speech Language Pathology Assessment and Plan  Patient Details  Name: Casey Harrell MRN: [id number] Date of Birth: 1934/06/13  SLP Diagnosis: Aphasia;Cognitive Impairments  Rehab Potential: Excellent ELOS: 2-2.5 weeks     Today's Date: 04/06/2015 SLP Individual Time: 1000-1100 SLP Individual Time Calculation (min): 60 min   Problem List:  Patient Active Problem List   Diagnosis Date Noted  . Embolic stroke involving posterior cerebral artery ([place]) 04/05/2015  . Orthostatic hypotension   . Hypertensive urgency 04/01/2015  . Fever in adult 04/01/2015  . Acute encephalopathy 04/01/2015  . Confusion   . CVA (cerebral infarction)   . Posterior circulation stroke ([place])   . Acute ischemic stroke ([place]) 03/31/2015  . Stroke ([place]) 03/31/2015  . CKD (chronic kidney disease) stage 3, GFR 30-59 ml/min 04/17/2014  . Gastroenteritis, non-infectious 04/11/2014  . Nausea vomiting and diarrhea 04/10/2014  . Hypokalemia 04/10/2014  . Dehydration 04/09/2014  . Bronchitis with airway obstruction ([place]) 02/03/2013  . Leukocytosis 02/03/2013  . Fever 02/03/2013  . Acute respiratory failure with hypoxia ([place]) 02/03/2013  . Depression 02/03/2013  . Hyponatremia 02/03/2013  . Obesity (BMI 30-39.9) 01/10/2013  . PVD (peripheral vascular disease)- S/P RCE 06/18/09 12/13/2012  . Sleep apnea- C-pap intol 12/13/2012  . Low risk cardiac nuclear stress test- 2009 and Feb 2011 12/13/2012  . Mild persistent asthma 11/29/2011  . Chronic cough 06/05/2011  . Diabetic neuropathy ([place]) 09/02/2009  . Dyslipidemia 07/13/2009  . Hypothyroidism 11/12/2008  . Type 2 diabetes mellitus, uncontrolled ([place]) 07/15/2008  . Essential hypertension 07/15/2008  . GERD 07/15/2008  . DIVERTICULOSIS, COLON 07/15/2008   Past Medical History:  Past Medical History  Diagnosis Date  . DIABETES-TYPE 2 07/15/2008  . DIABETIC PERIPHERAL NEUROPATHY 09/02/2009  . HYPERTENSION 07/15/2008  . ASTHMA 07/15/2008  . GERD 07/15/2008  .  PVD (peripheral vascular disease) ([place]) 06/18/09    RCE  . Normal cardiac stress test     low risk Nuc 2009, Feb 2011  . COPD (chronic obstructive pulmonary disease) ([place])   . Vertigo    Past Surgical History:  Past Surgical History  Procedure Laterality Date  . Appendectomy  1971  . Abdominal hysterectomy  1971  . Tonsillectomy    . Tubal ligation    . Cesarean section    . Flexible sigmoidoscopy      diverticulitis  . Laparotomy      X 3 with adhesions lysis  . Foot surgery      right foot X 2  . Carotid endarterectomy  06/18/09    RCE    Assessment / Plan / Recommendation Clinical Impression Patient is a 80 y.o. female with a history of dementia admitted on 11/23 with fever, 2 day history of diarrhea and confusion. MRI revealed acute infarcts involving the left parietal white matter and left temporal-parietal region in addition to chronic lacunar and bilateral occipital infarcts. Pt noted to have acute neuro changes and had syncopal episode 11/25 with resultant aphasia, right upper > lower hemiparesis. Symptoms improved somewhat with bedrest and IVF for relative orthostasis. No acute findings on head CT but repeat MRI revealed interval extension of left parietal and temporal-parietal infarcts with new infarcts involving the left temporal-parietal and occipital lobe most likely from hypotensive episode in setting of high grade stenosis L-M1 and/or P2 segments.   MRA brain revealed high grade stenosis proximal left M1 segment with extensive atheroma throughout posterior circulation and focal severe stenosis Left V4 segment. Carotid dopplers without significant ICA stenosis. 2 D echo  Speech Language Pathology Assessment and Plan  Patient Details  Name: Casey Harrell MRN: [id number] Date of Birth: 1934/06/13  SLP Diagnosis: Aphasia;Cognitive Impairments  Rehab Potential: Excellent ELOS: 2-2.5 weeks     Today's Date: 04/06/2015 SLP Individual Time: 1000-1100 SLP Individual Time Calculation (min): 60 min   Problem List:  Patient Active Problem List   Diagnosis Date Noted  . Embolic stroke involving posterior cerebral artery ([place]) 04/05/2015  . Orthostatic hypotension   . Hypertensive urgency 04/01/2015  . Fever in adult 04/01/2015  . Acute encephalopathy 04/01/2015  . Confusion   . CVA (cerebral infarction)   . Posterior circulation stroke ([place])   . Acute ischemic stroke ([place]) 03/31/2015  . Stroke ([place]) 03/31/2015  . CKD (chronic kidney disease) stage 3, GFR 30-59 ml/min 04/17/2014  . Gastroenteritis, non-infectious 04/11/2014  . Nausea vomiting and diarrhea 04/10/2014  . Hypokalemia 04/10/2014  . Dehydration 04/09/2014  . Bronchitis with airway obstruction ([place]) 02/03/2013  . Leukocytosis 02/03/2013  . Fever 02/03/2013  . Acute respiratory failure with hypoxia ([place]) 02/03/2013  . Depression 02/03/2013  . Hyponatremia 02/03/2013  . Obesity (BMI 30-39.9) 01/10/2013  . PVD (peripheral vascular disease)- S/P RCE 06/18/09 12/13/2012  . Sleep apnea- C-pap intol 12/13/2012  . Low risk cardiac nuclear stress test- 2009 and Feb 2011 12/13/2012  . Mild persistent asthma 11/29/2011  . Chronic cough 06/05/2011  . Diabetic neuropathy ([place]) 09/02/2009  . Dyslipidemia 07/13/2009  . Hypothyroidism 11/12/2008  . Type 2 diabetes mellitus, uncontrolled ([place]) 07/15/2008  . Essential hypertension 07/15/2008  . GERD 07/15/2008  . DIVERTICULOSIS, COLON 07/15/2008   Past Medical History:  Past Medical History  Diagnosis Date  . DIABETES-TYPE 2 07/15/2008  . DIABETIC PERIPHERAL NEUROPATHY 09/02/2009  . HYPERTENSION 07/15/2008  . ASTHMA 07/15/2008  . GERD 07/15/2008  .  PVD (peripheral vascular disease) ([place]) 06/18/09    RCE  . Normal cardiac stress test     low risk Nuc 2009, Feb 2011  . COPD (chronic obstructive pulmonary disease) ([place])   . Vertigo    Past Surgical History:  Past Surgical History  Procedure Laterality Date  . Appendectomy  1971  . Abdominal hysterectomy  1971  . Tonsillectomy    . Tubal ligation    . Cesarean section    . Flexible sigmoidoscopy      diverticulitis  . Laparotomy      X 3 with adhesions lysis  . Foot surgery      right foot X 2  . Carotid endarterectomy  06/18/09    RCE    Assessment / Plan / Recommendation Clinical Impression Patient is a 80 y.o. female with a history of dementia admitted on 11/23 with fever, 2 day history of diarrhea and confusion. MRI revealed acute infarcts involving the left parietal white matter and left temporal-parietal region in addition to chronic lacunar and bilateral occipital infarcts. Pt noted to have acute neuro changes and had syncopal episode 11/25 with resultant aphasia, right upper > lower hemiparesis. Symptoms improved somewhat with bedrest and IVF for relative orthostasis. No acute findings on head CT but repeat MRI revealed interval extension of left parietal and temporal-parietal infarcts with new infarcts involving the left temporal-parietal and occipital lobe most likely from hypotensive episode in setting of high grade stenosis L-M1 and/or P2 segments.   MRA brain revealed high grade stenosis proximal left M1 segment with extensive atheroma throughout posterior circulation and focal severe stenosis Left V4 segment. Carotid dopplers without significant ICA stenosis. 2 D echo  with Min A question and verbal cues.   Refer to Care Plan for Long Term Goals  Recommendations for other services: None  Discharge Criteria: Patient will be discharged from SLP if patient refuses treatment 3 consecutive  times without medical reason, if treatment goals not met, if there is a change in medical status, if patient makes no progress towards goals or if patient is discharged from hospital.  The above assessment, treatment plan, treatment alternatives and goals were discussed and mutually agreed upon: by patient and by family  Christiann Hagerty 04/06/2015, 11:31 AM

## 2015-04-06 NOTE — Progress Notes (Signed)
Social Work  Social Work Assessment and Plan  Patient Details  Name: Casey Harrell MRN: [id number] Date of Birth: Jan 09, 1935  Today's Date: 04/06/2015  Problem List:  Patient Active Problem List   Diagnosis Date Noted  . Embolic stroke involving posterior cerebral artery (HCC) 04/05/2015  . Orthostatic hypotension   . Hypertensive urgency 04/01/2015  . Fever in adult 04/01/2015  . Acute encephalopathy 04/01/2015  . Confusion   . CVA (cerebral infarction)   . Posterior circulation stroke (HCC)   . Acute ischemic stroke (HCC) 03/31/2015  . Stroke (HCC) 03/31/2015  . CKD (chronic kidney disease) stage 3, GFR 30-59 ml/min 04/17/2014  . Gastroenteritis, non-infectious 04/11/2014  . Nausea vomiting and diarrhea 04/10/2014  . Hypokalemia 04/10/2014  . Dehydration 04/09/2014  . Bronchitis with airway obstruction (HCC) 02/03/2013  . Leukocytosis 02/03/2013  . Fever 02/03/2013  . Acute respiratory failure with hypoxia (HCC) 02/03/2013  . Depression 02/03/2013  . Hyponatremia 02/03/2013  . Obesity (BMI 30-39.9) 01/10/2013  . PVD (peripheral vascular disease)- S/P RCE 06/18/09 12/13/2012  . Sleep apnea- C-pap intol 12/13/2012  . Low risk cardiac nuclear stress test- 2009 and Feb 2011 12/13/2012  . Mild persistent asthma 11/29/2011  . Chronic cough 06/05/2011  . Diabetic neuropathy (HCC) 09/02/2009  . Dyslipidemia 07/13/2009  . Hypothyroidism 11/12/2008  . Type 2 diabetes mellitus, uncontrolled (HCC) 07/15/2008  . Essential hypertension 07/15/2008  . GERD 07/15/2008  . DIVERTICULOSIS, COLON 07/15/2008   Past Medical History:  Past Medical History  Diagnosis Date  . DIABETES-TYPE 2 07/15/2008  . DIABETIC PERIPHERAL NEUROPATHY 09/02/2009  . HYPERTENSION 07/15/2008  . ASTHMA 07/15/2008  . GERD 07/15/2008  . PVD (peripheral vascular disease) (HCC) 06/18/09    RCE  . Normal cardiac stress test     low risk Nuc 2009, Feb 2011  . COPD (chronic obstructive pulmonary disease) (HCC)   .  Vertigo    Past Surgical History:  Past Surgical History  Procedure Laterality Date  . Appendectomy  1971  . Abdominal hysterectomy  1971  . Tonsillectomy    . Tubal ligation    . Cesarean section    . Flexible sigmoidoscopy      diverticulitis  . Laparotomy      X 3 with adhesions lysis  . Foot surgery      right foot X 2  . Carotid endarterectomy  06/18/09    RCE   Social History:  reports that she has never smoked. She has never used smokeless tobacco. She reports that she does not drink alcohol. Her drug history is not on file.  Family / Support Systems Marital Status: Married Patient Roles: Spouse, Parent, Other (Comment) (granddaughter) Spouse/Significant Other: Ephriam KnucklesClifton [phone] Children: Ephriam KnucklesClifton [phone]  [phone] Other Supports: 78 yo granddaughter Anticipated Caregiver: Granddaughter during the day while husband works-7-3 pm-PT Ability/Limitations of Caregiver: Family has a 24 hr care plan in place Caregiver Availability: 24/7 Family Dynamics: Clsoe knit small family they have one son who is close by and supportive and involved. They support one another and will do whatever they need. Aware pt will need 24 hr care at discharge and have developed a plan for this.  Social History Preferred language: English Religion: Methodist Cultural Background: No issues Education: [employer] Read: Yes Write: Yes Employment Status: Retired Fish farm managerLegal Hisotry/Current Legal Issues: No issues Guardian/Conservator: None-according to MD pt is not capable of making her own decisions while here, will rely upon her husband to make any decisions while here   Abuse/Neglect Physical  Abuse: Denies Verbal Abuse: Denies Sexual Abuse: Denies Exploitation of patient/patient's resources: Denies Self-Neglect: Denies  Emotional Status Pt's affect, behavior adn adjustment status: Pt is motivated to improve and is glad to be here on rehab but is tired from her am therpaies. Her  husband is here to observe and provide emotional support. She wants to get back as much independence as she can before leaving here. She was active prior to this admission. Recent Psychosocial Issues: Other medical issues but they were managed prior to these CVA's Pyschiatric History: No history deferred depression screen due to doing well and tried from therapies, will make referral to neuro-psych to see through stay due to would benefit.  Substance Abuse History: No issues  Patient / Family Perceptions, Expectations & Goals Pt/Family understanding of illness & functional limitations: Pt and husband can explain her strokes and deficits, all is still a surprise to them. Pt thought she was doing everything she was suppoose to do to prevent this form happening. Husband talks with the MD daily and feels he has a good understanding of wife's condition. Premorbid pt/family roles/activities: Wife, Mother, grandmother, retiree, church member, etc Anticipated changes in roles/activities/participation: resume Pt/family expectations/goals: Pt states: " I want to be able to do as much as I can for myself before I leave here."  Husband states: " I hope she gets some of her mobility back while here."  [employer]: None Premorbid Home Care/DME Agencies: None Transportation available at discharge: Husband and son Resource referrals recommended: Neuropsychology, Support group (specify)  Discharge Planning Living Arrangements: Spouse/significant other Support Systems: Spouse/significant other, Children, Other relatives, Friends/neighbors, Psychologist, clinical community Type of Residence: Private residence Insurance Resources: Media planner (specify) Theatre manager Medicare) Surveyor, quantity Resources: Family Support, Restaurant manager, fast food Screen Referred: No Living Expenses: Lives with family Money Management: Patient, Spouse Does the patient have any problems obtaining your medications?:  No Home Management: Patient did prior to admission but husband and granddaughter will do now Patient/Family Preliminary Plans: Return home with husband and granddaughter to assist when husband is working.  Pt will have 24 hr care upon discharge from rehab. Husband plans to be here and participate in her care when able, he wants to see her progress.  Social Work Anticipated Follow Up Needs: HH/OP, Support Group  Clinical Impression Pleasant female who is motivated to improve and will work hard while here. Her husband is supportive and here top observe for her first day, he plans to be here as often as he can. They have 24 hr care arranged for discharge, husband aware will need granddaughter here also prior to discharge. Work on discharge plans.   Lucy Chris 04/06/2015, 12:00 PM

## 2015-04-06 NOTE — Evaluation (Signed)
Physical Therapy Assessment and Plan  Patient Details  Name: Casey Harrell MRN: [id number] Date of Birth: [birth year]  PT Diagnosis: Abnormal posture, Abnormality of gait, Cognitive deficits, Coordination disorder, Difficulty walking, Hemiparesis dominant and Muscle weakness Rehab Potential: Good ELOS: 16-18 days   Today's Date: 04/06/2015 PT Individual Time: 800-935   95 min    Problem List:  Patient Active Problem List   Diagnosis Date Noted  . Embolic stroke involving posterior cerebral artery ([place]) 04/05/2015  . Orthostatic hypotension   . Hypertensive urgency 04/01/2015  . Fever in adult 04/01/2015  . Acute encephalopathy 04/01/2015  . Confusion   . CVA (cerebral infarction)   . Posterior circulation stroke ([place])   . Acute ischemic stroke ([place]) 03/31/2015  . Stroke ([place]) 03/31/2015  . CKD (chronic kidney disease) stage 3, GFR 30-59 ml/min 04/17/2014  . Gastroenteritis, non-infectious 04/11/2014  . Nausea vomiting and diarrhea 04/10/2014  . Hypokalemia 04/10/2014  . Dehydration 04/09/2014  . Bronchitis with airway obstruction ([place]) 02/03/2013  . Leukocytosis 02/03/2013  . Fever 02/03/2013  . Acute respiratory failure with hypoxia ([place]) 02/03/2013  . Depression 02/03/2013  . Hyponatremia 02/03/2013  . Obesity (BMI 30-39.9) 01/10/2013  . PVD (peripheral vascular disease)- S/P RCE 06/18/09 12/13/2012  . Sleep apnea- C-pap intol 12/13/2012  . Low risk cardiac nuclear stress test- 2009 and Feb 2011 12/13/2012  . Mild persistent asthma 11/29/2011  . Chronic cough 06/05/2011  . Diabetic neuropathy ([place]) 09/02/2009  . Dyslipidemia 07/13/2009  . Hypothyroidism 11/12/2008  . Type 2 diabetes mellitus, uncontrolled ([place]) 07/15/2008  . Essential hypertension 07/15/2008  . GERD 07/15/2008  . DIVERTICULOSIS, COLON 07/15/2008    Past Medical History:  Past Medical History  Diagnosis Date  . DIABETES-TYPE 2 07/15/2008  . DIABETIC PERIPHERAL NEUROPATHY 09/02/2009  . HYPERTENSION  07/15/2008  . ASTHMA 07/15/2008  . GERD 07/15/2008  . PVD (peripheral vascular disease) ([place]) 06/18/09    RCE  . Normal cardiac stress test     low risk Nuc 2009, Feb 2011  . COPD (chronic obstructive pulmonary disease) ([place])   . Vertigo    Past Surgical History:  Past Surgical History  Procedure Laterality Date  . Appendectomy  1971  . Abdominal hysterectomy  1971  . Tonsillectomy    . Tubal ligation    . Cesarean section    . Flexible sigmoidoscopy      diverticulitis  . Laparotomy      X 3 with adhesions lysis  . Foot surgery      right foot X 2  . Carotid endarterectomy  06/18/09    RCE    Assessment & Plan Clinical Impression: Casey Harrell is a 80 y.o. female with a history of dementia admitted on 11/23 with fever, 2 day history of diarrhea and confusion. MRI revealed acute infarcts involving the left periatrial white matter and left temporal-parietal region in addition to chronic lacunar and bilateral occipital infarcts. Pt noted to have acute neuro changes and had syncopal episode 11/25 with resultant aphasia, right upper > lower hemiparesis. Symptoms improved somewhat with bedrest and IVF for relative orthostasis. No acute findings on head CT but repeat MRI revealed interval extension of left periatrial and temporal-parietal infarcts with new infarcts involving the left temporal-parietal and occipital lobe most likely from hypotensive episode in setting of high grade stenosis L-M1 and/or P2 segments. MRA brain revealed high grade stenosis proximal left M1 segment with extensive atheroma throughout posterior circulation and focal severe stenosis Left V4 segment. Carotid dopplers  without significant ICA stenosis. 2 D echo with EF 60-65% with grade 2 diastolic dysfunction, atrial septal lipomatous hypertrophy and calcified MV with trivial regurgitation.   She was started on rocephin for Klebsiella UTI and mentation has improved. EEG revealed mild slowing and no seizure activity.  Neurology felt that left punctate strokes likely embolic due to unknown source and given MI stenosis recommended ASA and plavix X 3 months followed by Plavix alone. Dr. Erlinda Hong recommends BP goal 130-160 and blood pressure medication adjusted. Patient continues to be limited by RUE weakness with decreased balance, unsteady gait, lacks awareness and insight into deficits and mild fluent aphasia complicated by baseline dementia. Patient transferred to CIR on 04/05/2015.   Patient currently requires mod with mobility secondary to muscle weakness and muscle paralysis, decreased cardiorespiratoy endurance, impaired timing and sequencing, abnormal tone, unbalanced muscle activation, ataxia and decreased coordination, decreased awareness, decreased safety awareness and decreased memory and decreased sitting balance, decreased standing balance, decreased postural control, hemiplegia and decreased balance strategies.  Prior to hospitalization, patient was modified independent  with mobility and lived with Spouse in a House home.  Home access is 3Stairs to enter.  Patient will benefit from skilled PT intervention to maximize safe functional mobility, minimize fall risk and decrease caregiver burden for planned discharge home with 24 hour supervision.  Anticipate patient will benefit from follow up [place] at discharge.  PT - End of Session Activity Tolerance: Tolerates < 10 min activity, no significant change in vital signs Endurance Deficit: Yes Endurance Deficit Description: requires multiple rest breaks following minimal physical activity PT Assessment Rehab Potential (ACUTE/IP ONLY): Good PT Patient demonstrates impairments in the following area(s): Balance;Endurance;Edema;Motor;Nutrition;Perception;Safety;Sensory PT Transfers Functional Problem(s): Bed Mobility;Bed to Chair;Car;Furniture PT Locomotion Functional Problem(s): Ambulation;Wheelchair Mobility;Stairs PT Plan PT Intensity: Minimum of 1-2 x/day ,45 to  90 minutes PT Frequency: 5 out of 7 days PT Duration Estimated Length of Stay: 16-18 days PT Treatment/Interventions: Ambulation/gait training;Balance/vestibular training;Cognitive remediation/compensation;Community reintegration;Discharge planning;Disease management/prevention;DME/adaptive equipment instruction;Functional mobility training;Neuromuscular re-education;Functional electrical stimulation;Patient/family education;Pain Landscape architect;Therapeutic Activities;Therapeutic Exercise;UE/LE Coordination activities;UE/LE Strength taining/ROM;Wheelchair propulsion/positioning;Visual/perceptual remediation/compensation PT Transfers Anticipated Outcome(s): supervision PT Locomotion Anticipated Outcome(s): supervision household ambulatory PT Recommendation Follow Up Recommendations: Home health PT;24 hour supervision/assistance Patient destination: Home Equipment Recommended: To be determined Equipment Details: may need wheelchair, has RW and 4WW  Skilled Therapeutic Intervention Skilled therapeutic intervention initiated after completion of evaluation. Discussed with patient and husband falls risk, safety within room, and focus of therapy during stay. Discussed possible length of stay, goals, and follow-up therapy. Patient performed UB/LB dressing from edge of bed with min-mod A for sitting balance due to patient's feet unable to reach floor with cues for hemi dressing technique and mod-max A for sit <> stand to pull up underwear and pants. Patient required mod faded to max A for stand pivot transfers throughout session. Patient fatigued quickly noted by SOB with minimal activity (i.e.one transfer, ambulating 3 ft) and required multiple seated rest breaks. Upon returning to room, patient requested to use bathroom, performed stand pivot transfer using grab bar with mod A and assist for clothing management. Retrieved 16x16 wheelchair and cushion and 1/2 lap tray for RUE. Patient left sitting in  wheelchair with 1/2 lap tray donned and husband present with all needs within reach.   PT Evaluation Precautions/Restrictions Precautions Precautions: Fall Restrictions Weight Bearing Restrictions: No General   Vital SignsTherapy Vitals Temp: 98.2 F (36.8 C) Temp Source: Oral Pulse Rate: 75 Resp: 18 BP: 117/61 mmHg Patient Position (if appropriate): Sitting  Oxygen Therapy SpO2: 93 % O2 Device: Not Delivered Pain Pain Assessment Pain Assessment: No/denies pain Home Living/Prior Functioning Home Living Living Arrangements: Spouse/significant other Available Help at Discharge: Family;Available 24 hours/day (96 yo grand daughter can help during the day) Type of Home: House Home Access: Stairs to enter [employer] of Steps: 3 Entrance Stairs-Rails: Left Home Layout: One level Bathroom Shower/Tub: Walk-in shower;Door [employer] Toilet: Standard Bathroom Accessibility: Yes Additional Comments: husband works during the day  Lives With: Spouse Prior Function Level of Independence: Independent with basic ADLs;Independent with gait;Independent with transfers  Able to Take Stairs?: Yes Driving: Yes Vocation: Retired Leisure: Hobbies-yes (Comment) Comments: likes to work in the yard Vision/Perception  Vision - Assessment Eye Alignment: Within Functional Limits Ocular Range of Motion: Within Functional Limits Tracking/Visual Pursuits: Requires cues, head turns, or add eye shifts to track Saccades: Additional eye shifts occurred during testing;Additional head turns occurred during testing  Cognition Overall Cognitive Status: History of cognitive impairments - at baseline (baseline dementia) Arousal/Alertness: Awake/alert Attention: Sustained Sustained Attention: Appears intact Memory: Impaired Memory Impairment: Retrieval deficit Awareness: Impaired Awareness Impairment: Emergent impairment Problem Solving: Impaired Problem Solving Impairment: Functional  basic Safety/Judgment: Impaired Sensation Sensation Light Touch: Appears Intact Stereognosis: Not tested Hot/Cold: Appears Intact Proprioception: Impaired by gross assessment (impaired RUE) Coordination Gross Motor Movements are Fluid and Coordinated: No Fine Motor Movements are Fluid and Coordinated: No Finger Nose Finger Test: mild ataxia on Lt, unable to complete on Rt Heel Shin Test: mild impairment BLE, possibly due to hip weakness/decreased hip ROM Motor  Motor Motor: Hemiplegia Motor - Skilled Clinical Observations: RUE hemiparesis  Mobility Bed Mobility Bed Mobility: Supine to Sit Supine to Sit: With rails;3: Mod assist;HOB elevated Supine to Sit Details: Verbal cues for technique;Verbal cues for precautions/safety;Verbal cues for sequencing;Manual facilitation for weight shifting Transfers Transfers: Yes Stand Pivot Transfers: 3: Mod assist;2: Max assist;With armrests Stand Pivot Transfer Details: Verbal cues for sequencing;Verbal cues for technique;Verbal cues for precautions/safety Locomotion  Ambulation Ambulation: Yes Ambulation/Gait Assistance: 3: Mod assist;1: +2 Total assist (+2 for wheelchair follow) Ambulation Distance (Feet): 3 Feet Assistive device: 1 person hand held assist Ambulation/Gait Assistance Details: terminated due to fatigue Gait Gait: Yes Gait Pattern: Impaired Gait Pattern: Step-through pattern;Decreased stride length;Decreased hip/knee flexion - right;Decreased hip/knee flexion - left;Decreased dorsiflexion - right;Decreased dorsiflexion - left;Decreased trunk rotation;Trunk flexed;Abducted - left;Abducted- right Gait velocity: decreased Stairs / Additional Locomotion Stairs: Yes Stairs Assistance: 3: Mod assist Stair Management Technique: One rail Left;Forwards;Backwards;Step to pattern (R HHA) Number of Stairs: 2 Height of Stairs: 3 Ramp: Not tested (comment) Curb: Not tested (comment) Wheelchair Mobility Wheelchair Mobility:  Yes Wheelchair Assistance: 4: Advertising account executive Details: Verbal cues for Marketing executive: Left upper extremity Wheelchair Parts Management: Needs assistance Distance: 50 ft  Trunk/Postural Assessment  Cervical Assessment Cervical Assessment: Exceptions to [facility] (forward head) Thoracic Assessment Thoracic Assessment: Exceptions to WFL (slight kyphosis) Lumbar Assessment Lumbar Assessment: Within Functional Limits Postural Control Postural Control: Deficits on evaluation Protective Responses: delayed  Balance Balance Balance Assessed: Yes Static Sitting Balance Static Sitting - Balance Support: Left upper extremity supported Static Sitting - Level of Assistance: 4: Min assist;5: Stand by assistance Dynamic Sitting Balance Dynamic Sitting - Balance Support: During functional activity;Left upper extremity supported Dynamic Sitting - Level of Assistance: 4: Min assist;3: Mod assist Sitting balance - Comments: seated edge of bed x 15 min Static Standing Balance Static Standing - Balance Support: Right upper extremity supported;During functional activity Static Standing - Level of  Assistance: 3: Mod assist;4: Min assist Extremity Assessment  RUE Assessment RUE Assessment: Exceptions to [facility] RUE AROM (degrees) RUE Overall AROM Comments: shoulder flexion approx 20 degrees, moderate internal rotation, minimal active 3rd and 4th digit movement with grasp/release RUE PROM (degrees) RUE Overall PROM Comments: WFL RUE Strength RUE Overall Strength Comments: deltoid 2/5, bicep/tricep 2/5, wrist 0/5 LUE Assessment LUE Assessment: Within Functional Limits RLE Assessment RLE Assessment: Within Functional Limits (grossly 4+/5 throughout) LLE Assessment LLE Assessment: Within Functional Limits (5/5 throughout)   See Function Navigator for Current Functional Status.   Refer to Care Plan for Long Term Goals  Recommendations for other services:  None  Discharge Criteria: Patient will be discharged from PT if patient refuses treatment 3 consecutive times without medical reason, if treatment goals not met, if there is a change in medical status, if patient makes no progress towards goals or if patient is discharged from hospital.  The above assessment, treatment plan, treatment alternatives and goals were discussed and mutually agreed upon: by patient and by family  Laretta Alstrom 04/06/2015, 3:39 PM

## 2015-04-06 NOTE — Progress Notes (Signed)
Ranelle Oyster, MD Physician Signed Physical Medicine and Rehabilitation Consult Note 04/03/2015 9:24 AM  Related encounter: ED to Hosp-Admission (Discharged) from 03/31/2015 in [facility] 8M NEURO MEDICAL    Expand All Collapse All        Physical Medicine and Rehabilitation Consult Reason for Consult: right hemiparesis Referring Physician: Robb Matar   HPI: JASON HAUGE is a 80 y.o. female with a history of dementia admitted on 11/23 with fever, confusion. MRI revealed acute infarcts involving the left periatrial white matter and left temporal-parietal region in addition to chronic lacunar and bilateral occipital infarcts. Pt noted to have acute neuro changes while sitting up in chair on 11/25 with aphasia, right upper > lower hemiparesis. Symptoms improved somewhat with bedrest and IVF for relative orthostasis. No acute findings on head CT but repeat MRI revealed interval extension of left periatrial and temporal-parietal infarcts with new infarcts involving the left temporal-parietal and occipital lobe. Pt was diagnosed with a klebsiella UTI for which she was started on rocephin.    Review of Systems  Constitutional: Positive for malaise/fatigue. Negative for fever and chills.  HENT: Positive for hearing loss.  Eyes: Positive for blurred vision. Negative for pain.  Respiratory: Negative for cough and hemoptysis.  Cardiovascular: Negative for chest pain.  Gastrointestinal: Negative for heartburn and nausea.  Genitourinary: Positive for dysuria.  Musculoskeletal: Negative for myalgias and neck pain.  Skin: Negative for rash.  Neurological: Positive for dizziness, tingling, sensory change, speech change, focal weakness, weakness and headaches.  Psychiatric/Behavioral: Negative for depression and suicidal ideas.   Past Medical History  Diagnosis Date  . DIABETES-TYPE 2 07/15/2008  . DIABETIC PERIPHERAL NEUROPATHY 09/02/2009  . HYPERTENSION 07/15/2008  .  ASTHMA 07/15/2008  . GERD 07/15/2008  . PVD (peripheral vascular disease) (HCC) 06/18/09    RCE  . Normal cardiac stress test     low risk Nuc 2009, Feb 2011  . COPD (chronic obstructive pulmonary disease) [facility]    Past Surgical History  Procedure Laterality Date  . Appendectomy  1971  . Abdominal hysterectomy  1971  . Tonsillectomy    . Tubal ligation    . Cesarean section    . Flexible sigmoidoscopy      diverticulitis  . Laparotomy      X 3 with adhesions lysis  . Foot surgery      right foot X 2  . Carotid endarterectomy  06/18/09    RCE   Family History  Problem Relation Age of Onset  . Diabetes Mother   . Arthritis Mother   . Heart disease Mother   . Heart disease Father   . Celiac disease Sister   . Stroke Maternal Grandfather   . Cancer Paternal Grandfather     lung  . Heart disease Sister   . Heart disease Sister   . Asthma Sister   . Heart disease Brother   . Cancer Brother 41    lung cancer   Social History:  reports that she has never smoked. She has never used smokeless tobacco. She reports that she does not drink alcohol. Her drug history is not on file. Allergies:  Allergies  Allergen Reactions  . Doxycycline Hyclate Nausea And Vomiting    REACTION: vomiting  . Iohexol     Code: HIVES, Desc: pt. states she breaks out in hives 06/15/08   . Morphine Sulfate     REACTION: hallucinations  . Moxifloxacin     REACTION: unknown   Medications Prior  to Admission  Medication Sig Dispense Refill  . amitriptyline (ELAVIL) 25 MG tablet Take 1 tablet (25 mg total) by mouth at bedtime. 90 tablet 1  . AZELASTINE HCL OP Apply to eye.    . canagliflozin (INVOKANA) 300 MG TABS tablet Take 300 mg by mouth daily before breakfast. 30 tablet 11  . carvedilol (COREG) 25 MG tablet Take 1 tablet  (25 mg total) by mouth 2 (two) times daily with a meal. 180 tablet 1  . fluticasone (VERAMYST) 27.5 MCG/SPRAY nasal spray Place 2 sprays into the nose daily.    . hydrocortisone (ANUSOL-HC) 25 MG suppository Place 1 suppository (25 mg total) rectally 2 (two) times daily. 24 suppository 0  . Insulin Detemir (LEVEMIR FLEXTOUCH) 100 UNIT/ML Pen Inject 40 Units into the skin 2 (two) times daily. 15 mL 5  . insulin lispro (HUMALOG) 100 UNIT/ML KiwkPen INJECT 20 UNITS AT BREAKFAST, 10 UNITS AT LUNCH, AND 20 UNITS WITH SUPPER. 15 pen 5  . lansoprazole (PREVACID) 15 MG capsule Take 15 mg by mouth as needed (acid reflux).    Marland Kitchen levothyroxine (SYNTHROID, LEVOTHROID) 50 MCG tablet Take 1 tablet (50 mcg total) by mouth daily before breakfast. 90 tablet 0  . meclizine (ANTIVERT) 12.5 MG tablet Take 1 tablet (12.5 mg total) by mouth every 6 (six) hours as needed for dizziness. 30 tablet 0  . metFORMIN (GLUCOPHAGE) 1000 MG tablet Take 1 tablet (1,000 mg total) by mouth 2 (two) times daily with a meal. 180 tablet 3  . ondansetron (ZOFRAN ODT) 4 MG disintegrating tablet Take 1 tablet (4 mg total) by mouth every 8 (eight) hours as needed for nausea or vomiting. 10 tablet 0  . simvastatin (ZOCOR) 20 MG tablet TAKE 1 TABLET BY MOUTH AT BEDTIME 90 tablet 1  . valsartan (DIOVAN) 160 MG tablet TAKE 1 TABLET (160 MG TOTAL) BY MOUTH DAILY. 30 tablet 5  . albuterol (ACCUNEB) 1.25 MG/3ML nebulizer solution Take 1 ampule by nebulization every 6 (six) hours as needed for wheezing.    Marland Kitchen aspirin 81 MG tablet Take 81 mg by mouth daily.     . B-D ULTRAFINE III SHORT PEN 31G X 8 MM MISC USE AS DIRECTED 100 each 1  . Calcium Citrate (CITRACAL PO) Take 1 tablet by mouth 2 (two) times daily.     . Cholecalciferol (VITAMIN D3) 2000 UNITS TABS Take 2,000 Units by mouth daily.     Marland Kitchen CLEVER CHEK AUTO-CODE VOICE test strip     . diltiazem (CARTIA XT)  120 MG 24 hr capsule Take 1 capsule (120 mg total) by mouth daily. NEEDS APPOINTMENT FOR FUTURE REFILLS 30 capsule 0  . DULoxetine (CYMBALTA) 60 MG capsule TAKE 1 CAPSULE BY MOUTH DAILY 90 capsule 1  . Lancets MISC     . magnesium oxide (MAG-OX) 400 (241.3 MG) MG tablet TAKE 1 TABLET BY MOUTH DAILY 30 tablet 3  . NOVOLOG FLEXPEN 100 UNIT/ML FlexPen INJECT 9 TO 14 UNITS UNDER THE SKIN 2 TIMES DAILY WITH A MEAL 9 UNITS BEFORE BREAKFAST AND 14 UNITS AFTER SUPPER 100 mL 0  . pantoprazole (PROTONIX) 40 MG tablet TAKE 1 TABLET BY MOUTH TWICE DAILY 180 tablet 3  . pregabalin (LYRICA) 150 MG capsule TAKE ONE CAPSULE BY MOUTH DAILY 90 capsule 1  . promethazine (PHENERGAN) 12.5 MG tablet 1-2 tabs po q6h prn nausea 30 tablet 1    Home: Home Living Family/patient expects to be discharged to:: Private residence Living Arrangements: Spouse/significant other Available Help at Discharge:  Family, Available 24 hours/day Type of Home: House Home Access: Stairs to enter [employer] of Steps: 3 Home Layout: One level Bathroom Shower/Tub: Psychologist, counselling, Sport and exercise psychologist: Standard Bathroom Accessibility: Yes Home Equipment: Medical laboratory scientific officer - single point, Environmental consultant - 2 wheels, Shower seat - built in Additional Comments: husband works during the day  Functional History: Prior Function Level of Independence: Independent with assistive device(s) Comments: husband assists her Functional Status:  Mobility: Bed Mobility Overal bed mobility: Needs Assistance Bed Mobility: Supine to Sit Supine to sit: Mod assist General bed mobility comments: mainly needs assist under trunk and to finish scooting to edge of bed, cues for management of RUE Transfers Overall transfer level: Needs assistance Equipment used: 1 person hand held assist Transfers: Sit to/from Stand, Stand Pivot Transfers Sit to Stand: Mod assist Stand pivot transfers: Mod assist General transfer comment:  Mod assist to assist with lift/lower from recliner. Cues for safety.  Ambulation/Gait General Gait Details: unable to do more than transfer, did pass out this am    ADL: ADL Overall ADL's : Needs assistance/impaired Eating/Feeding: Moderate assistance, Sitting Grooming: Moderate assistance, Sitting Upper Body Bathing: Maximal assistance, Sitting Lower Body Bathing: Maximal assistance, Sit to/from stand Upper Body Dressing : Maximal assistance, Sitting Lower Body Dressing: Maximal assistance, Sit to/from stand Toilet Transfer: Moderate assistance, Cueing for safety, Stand-pivot, BSC Toileting- Clothing Manipulation and Hygiene: Total assistance Functional mobility during ADLs: Moderate assistance General ADL Comments: Pt lethargic during session. Pt requires mod to total assist due to weakness throughout RUE and decreased overall cognition.   Cognition: Cognition Overall Cognitive Status: Impaired/Different from baseline Arousal/Alertness: Awake/alert Orientation Level: Oriented to person, Disoriented to time, Disoriented to situation, Disoriented to place Attention: Sustained Sustained Attention: Appears intact Memory: Impaired Memory Impairment: Retrieval deficit (baseline) Awareness: Impaired Awareness Impairment: Emergent impairment Problem Solving: Impaired Problem Solving Impairment: Functional basic Safety/Judgment: Impaired Cognition Arousal/Alertness: Awake/alert Behavior During Therapy: WFL for tasks assessed/performed Overall Cognitive Status: Impaired/Different from baseline Area of Impairment: Attention, Memory, Following commands, Safety/judgement, Awareness, Problem solving Current Attention Level: Selective Memory: Decreased short-term memory Following Commands: Follows one step commands with increased time, Follows multi-step commands inconsistently Safety/Judgement: Decreased awareness of safety, Decreased awareness of deficits Awareness:  Intellectual Problem Solving: Slow processing, Decreased initiation, Difficulty sequencing, Requires verbal cues, Requires tactile cues General Comments: During interview process with home living enviornment and equipment pt incorrectly asnwering questions, husband present to correctly answer questions  Blood pressure 170/80, pulse 70, temperature 99 F (37.2 C), temperature source Oral, resp. rate 18, height  (1.575 m), weight 65.4 kg (144 lb 2.9 oz), SpO2 100 %. Physical Exam  Constitutional: She appears well-nourished.  HENT:  Head: Normocephalic and atraumatic.  Eyes: Pupils are equal, round, and reactive to light.  Neck: No tracheal deviation present. No thyromegaly present.  Cardiovascular: Normal rate.  No murmur heard. Respiratory: Effort normal. No respiratory distress. She has no wheezes.  GI: She exhibits no distension. There is no tenderness.  Musculoskeletal: She exhibits no edema or tenderness.  Neurological: She is alert. A cranial nerve deficit is present. She exhibits normal muscle tone.  Right visual field loss---inconsistent on exam however, may be slight inattention. Oriented to name, place, reason she's here. RUE 0/5 with 1/2 sensation. RLE: grossly 2 to 3+/5 prox to distal--does sense pain. DTRs 1+  Skin: Skin is warm.  Psychiatric:  Slightly confused, non anxious or agitated     Lab Results Last 24 Hours    Results for orders placed or  performed during the hospital encounter of 03/31/15 (from the past 24 hour(s))  Glucose, capillary Status: Abnormal   Collection Time: 04/02/15 10:16 AM  Result Value Ref Range   Glucose-Capillary 170 (H) 65 - 99 mg/dL  Glucose, capillary Status: Abnormal   Collection Time: 04/02/15 11:52 AM  Result Value Ref Range   Glucose-Capillary 147 (H) 65 - 99 mg/dL  Urine rapid drug screen (hosp performed) Status: None   Collection Time: 04/02/15 12:45 PM  Result Value Ref Range    Opiates NONE DETECTED NONE DETECTED   Cocaine NONE DETECTED NONE DETECTED   Benzodiazepines NONE DETECTED NONE DETECTED   Amphetamines NONE DETECTED NONE DETECTED   Tetrahydrocannabinol NONE DETECTED NONE DETECTED   Barbiturates NONE DETECTED NONE DETECTED  Urinalysis with microscopic (not at [facility]) Status: Abnormal   Collection Time: 04/02/15 12:46 PM  Result Value Ref Range   Color, Urine YELLOW YELLOW   APPearance HAZY (A) CLEAR   Specific Gravity, Urine 1.015 1.005 - 1.030   pH 5.5 5.0 - 8.0   Glucose, UA >1000 (A) NEGATIVE mg/dL   Hgb urine dipstick NEGATIVE NEGATIVE   Bilirubin Urine NEGATIVE NEGATIVE   Ketones, ur NEGATIVE NEGATIVE mg/dL   Protein, ur NEGATIVE NEGATIVE mg/dL   Nitrite POSITIVE (A) NEGATIVE   Leukocytes, UA SMALL (A) NEGATIVE   WBC, UA TOO NUMEROUS TO COUNT 0 - 5 WBC/hpf   RBC / HPF NONE SEEN 0 - 5 RBC/hpf   Bacteria, UA MANY (A) NONE SEEN   Squamous Epithelial / LPF 0-5 (A) NONE SEEN  Glucose, capillary Status: Abnormal   Collection Time: 04/02/15 5:21 PM  Result Value Ref Range   Glucose-Capillary 261 (H) 65 - 99 mg/dL   Comment 1 Notify RN    Comment 2 Document in Chart   Glucose, capillary Status: Abnormal   Collection Time: 04/02/15 10:15 PM  Result Value Ref Range   Glucose-Capillary 182 (H) 65 - 99 mg/dL   Comment 1 Notify RN    Comment 2 Document in Chart   CBC Status: Abnormal   Collection Time: 04/03/15 3:33 AM  Result Value Ref Range   WBC 12.1 (H) 4.0 - 10.5 K/uL   RBC 4.40 3.87 - 5.11 MIL/uL   Hemoglobin 11.2 (L) 12.0 - 15.0 g/dL   HCT 16.1 09.6 - 04.5 %   MCV 81.8 78.0 - 100.0 fL   MCH 25.5 (L) 26.0 - 34.0 pg   MCHC 31.1 30.0 - 36.0 g/dL   RDW 40.9 (H) 81.1 - 91.4 %   Platelets 256 150 - 400 K/uL  Basic metabolic panel Status: Abnormal    Collection Time: 04/03/15 3:33 AM  Result Value Ref Range   Sodium 139 135 - 145 mmol/L   Potassium 3.8 3.5 - 5.1 mmol/L   Chloride 110 101 - 111 mmol/L   CO2 23 22 - 32 mmol/L   Glucose, Bld 115 (H) 65 - 99 mg/dL   BUN 21 (H) 6 - 20 mg/dL   Creatinine, Ser 7.82 0.44 - 1.00 mg/dL   Calcium 8.6 (L) 8.9 - 10.3 mg/dL   GFR calc non Af Amer >60 >60 mL/min   GFR calc Af Amer >60 >60 mL/min   Anion gap 6 5 - 15  Glucose, capillary Status: Abnormal   Collection Time: 04/03/15 6:27 AM  Result Value Ref Range   Glucose-Capillary 104 (H) 65 - 99 mg/dL   Comment 1 Notify RN    Comment 2 Document in Chart  Imaging Results (Last 48 hours)    Ct Head Wo Contrast  04/02/2015 CLINICAL DATA: Woke up this morning with confusion, agitation, getting worse, left arm weakness EXAM: CT HEAD WITHOUT CONTRAST TECHNIQUE: Contiguous axial images were obtained from the base of the skull through the vertex without intravenous contrast. COMPARISON: Multiple prior studies including 03/31/2015 CT scan FINDINGS: Encephalomalacia in the bilateral occipital lobes is stable. There is severe diffuse low attenuation in the deep white matter. There is diffuse cortical atrophy. There is no hemorrhage or extra-axial fluid. There is no mass or hydrocephalus. Again identified is an air-fluid level in the right maxillary sinus with inflammatory change right sphenoid sinus, left sphenoid sinus, and left maxillary sinus. The calvarium is intact. IMPRESSION: Bilateral remote occipital lobe infarcts with chronic involutional change. Known small patchy acute infarcts left hemisphere seen on MRI performed 04/01/2015 not well appreciated on CT. Electronically Signed By: Esperanza Heiraymond Rubner M.D. On: 04/02/2015 17:01   Mr Brain Wo Contrast  04/03/2015 CLINICAL DATA: Initial evaluation for mental status change, increased right-sided weakness. Hypotensive  episode. EXAM: MRI HEAD WITHOUT CONTRAST TECHNIQUE: Multiplanar, multiecho pulse sequences of the brain and surrounding structures were obtained without intravenous contrast. COMPARISON: Previous CT from earlier the same day as well as prior MRI from 04/01/2015. FINDINGS: There has been slight interval extension of previously seen patchy ischemic infarcts involving the left periatrial white matter and left temporal parietal region, with multiple new scattered ischemic infarcts involving the cortical gray matter and deep white matter of the left parietal lobe (series 3, image 28, 30) as well as the cortical and subcortical white matter of the left occipital lobe (series 3, image 24). New ischemic changes within the periatrial white matter (series 3, image 20). Ischemic change within the posterior left corona radiata also now more prominent (series 3, image 33). Tiny new infarct within the deep white matter of the anterior left centrum semi ovale (series 3, image 37). No hemorrhage or mass effect. Intravascular flow voids are grossly maintained. Atrophy with chronic small vessel ischemic disease again noted. Remote bilateral occipital infarcts, left larger than right. Scattered remote lacunar infarcts within the centrum semi ovales, basal ganglia, and thalami. Few chronic micro hemorrhages again noted. No mass lesion, midline shift, or mass effect. No hydrocephalus. No extra-axial fluid collection. Craniocervical junction normal. Pituitary gland normal. No acute abnormality about the orbits. Mucosal thickening within the left maxillary sinus with air-fluid level in the right maxillary sinus. Mild opacity within the right sphenoid sinus. These changes are stable. Small right mastoid effusion, also stable. Bone marrow signal intensity within normal limits. Scalp soft tissues demonstrate no acute abnormality. IMPRESSION: 1. Interval extension in the recently identified left periatrial and temporoparietal infarcts, with  multiple scattered new ischemic infarcts involving the left temporoparietal region and left occipital lobe. This most likely resulted from recent hypotensive episode in the setting of known high-grade stenoses in the left M1 and/or P2 segments as seen on recent MRA. 2. Otherwise stable appearance of the brain. Electronically Signed By: Rise MuBenjamin McClintock M.D. On: 04/03/2015 00:45     Assessment/Plan: Diagnosis: Left temporal-parietal-occipital CVA's with right hemiparesis, confusion, visual loss 1. Does the need for close, 24 hr/day medical supervision in concert with the patient's rehab needs make it unreasonable for this patient to be served in a less intensive setting? Yes 2. Co-Morbidities requiring supervision/potential complications: uro sepsis, orthostatic hypotension, mild dementia 3. Due to bladder management, bowel management, safety and skin/wound care, does the patient require 24 hr/day rehab  nursing? Yes 4. Does the patient require coordinated care of a physician, rehab nurse, PT (1-2 hrs/day, 5 days/week), OT (1-2 hrs/day, 5 days/week) and SLP (1-2 hrs/day, 5 days/week) to address physical and functional deficits in the context of the above medical diagnosis(es)? Yes Addressing deficits in the following areas: balance, endurance, locomotion, strength, transferring, bowel/bladder control, bathing, dressing, feeding, grooming, toileting, cognition, speech and psychosocial support 5. Can the patient actively participate in an intensive therapy program of at least 3 hrs of therapy per day at least 5 days per week? Yes 6. The potential for patient to make measurable gains while on inpatient rehab is excellent 7. Anticipated functional outcomes upon discharge from inpatient rehab are supervision with PT, supervision and min assist with OT, supervision with SLP. 8. Estimated rehab length of stay to reach the above functional goals is: 13-20 days 9. Does the patient have adequate social  supports and living environment to accommodate these discharge functional goals? Yes 10. Anticipated D/C setting: Home 11. Anticipated post D/C treatments: HH therapy and Outpatient therapy 12. Overall Rehab/Functional Prognosis: excellent  RECOMMENDATIONS: This patient's condition is appropriate for continued rehabilitative care in the following setting: CIR Patient has agreed to participate in recommended program. Yes Note that insurance prior authorization may be required for reimbursement for recommended care.  Comment: Rehab Admissions Coordinator to follow up.  Thanks,  Ranelle Oyster, MD, FAAPMR     04/03/2015       Routing History     Date/Time From To Method   04/03/2015 9:39 AM Ranelle Oyster, MD Ranelle Oyster, MD In Basket   04/03/2015 9:39 AM Ranelle Oyster, MD [name], MD In Basket

## 2015-04-07 ENCOUNTER — Inpatient Hospital Stay (HOSPITAL_COMMUNITY): Payer: Medicare Other

## 2015-04-07 ENCOUNTER — Inpatient Hospital Stay (HOSPITAL_COMMUNITY): Payer: Medicare Other | Admitting: Speech Pathology

## 2015-04-07 ENCOUNTER — Inpatient Hospital Stay (HOSPITAL_COMMUNITY): Payer: Medicare Other | Admitting: Physical Therapy

## 2015-04-07 ENCOUNTER — Inpatient Hospital Stay (HOSPITAL_COMMUNITY): Payer: Medicare Other | Admitting: *Deleted

## 2015-04-07 DIAGNOSIS — Z794 Long term (current) use of insulin: Secondary | ICD-10-CM

## 2015-04-07 LAB — GLUCOSE, CAPILLARY
Glucose-Capillary: 172 mg/dL — ABNORMAL HIGH (ref 65–99)
Glucose-Capillary: 163 mg/dL — ABNORMAL HIGH (ref 65–99)
Glucose-Capillary: 223 mg/dL — ABNORMAL HIGH (ref 65–99)
Glucose-Capillary: 250 mg/dL — ABNORMAL HIGH (ref 65–99)

## 2015-04-07 MED ORDER — INSULIN DETEMIR 100 UNIT/ML ~~LOC~~ SOLN
35.0000 [IU] | Freq: Two times a day (BID) | SUBCUTANEOUS | Status: DC
  Administered 2015-04-07 – 2015-04-10 (×6): 35 [IU] via SUBCUTANEOUS
  Filled 2015-04-07 (×7): qty 0.35

## 2015-04-07 MED ORDER — FLUCONAZOLE 100 MG PO TABS
150.0000 mg | ORAL_TABLET | Freq: Once | ORAL | Status: AC
  Administered 2015-04-07: 150 mg via ORAL
  Filled 2015-04-07: qty 2

## 2015-04-07 NOTE — Progress Notes (Signed)
Occupational Therapy Session Note  Patient Details  Name: Casey Harrell MRN: [id number] Date of Birth: 1935/03/03  Today's Date: 04/07/2015 OT Individual Time: 0800-0900 OT Individual Time Calculation (min): 60 min    Short Term Goals: Week 1:  OT Short Term Goal 1 (Week 1): Pt will complete stand pivot transfer to toilet with min assist OT Short Term Goal 2 (Week 1): Pt will complete shower transfers with min assist with LRAD OT Short Term Goal 3 (Week 1): Pt will complete LB dressing with mod assist OT Short Term Goal 4 (Week 1): Pt will complete UB dressing with min assist OT Short Term Goal 5 (Week 1): Pt will complete 1 grooming task in standing with min assist  Skilled Therapeutic Interventions/Progress Updates:    Pt resting in bed upon arrival with husband present.  Pt engaged in BADL retraining including bathing and dressing with sit<>stand from w/c at sink.  Pt performed stand pivot transfer bed->w/c with mod A and required min A for remaining sit<>stand with steady A for standing balance (mod verbal cues for weight shifts) during BADLs.  Pt required min verbal cues to initiate use of RUE during bathing and dressing tasks and required hand over hand assist to complete tasks with RUE.  Pt educated on hemi dressing techniques.  Pt fatigues quickly and requires multiple rest breaks throughout session.  Pt required mod verbal cues for RUE positioning when not engaged functionally.  Pt remained in w/c with half lap tray in place and husband present.  Focus on activity tolerance, sit<>stand, standing balance, functional transfers, increased RUE use, and safety awareness.  Therapy Documentation Precautions:  Precautions Precautions: Fall Restrictions Weight Bearing Restrictions: No Pain:  Pt denies pain   See Function Navigator for Current Functional Status.   Therapy/Group: Individual Therapy  Rich BraveLanier, Ayliana Casciano Chappell 04/07/2015, 9:02 AM

## 2015-04-07 NOTE — Progress Notes (Signed)
Physical Therapy Session Note  Patient Details  Name: Casey Harrell MRN: [id number] Date of Birth: 1934/10/04  Today's Date: 04/07/2015 PT Individual Time: 1438-1500 PT Individual Time Calculation (min): 22 min   Short Term Goals: Week 1:  PT Short Term Goal 1 (Week 1): Patient will perform bed mobility with min A.  PT Short Term Goal 2 (Week 1): Patient will perform bed <> wheelchair transfers with min A.  PT Short Term Goal 3 (Week 1): Patient will ambulate x 10 ft with assist of one person.  PT Short Term Goal 4 (Week 1): Patient will perform dynamic standing balance with mod A x 3 min.  PT Short Term Goal 5 (Week 1): Patient will negotiate up/down 3 stairs using L rail with mod A.   Skilled Therapeutic Interventions/Progress Updates:    Pt received resting in bed and agreeable to therapy session.  Session focused on endurance and RUE NMR.  Pt transitioned supine>sit with mod assist and stand/pivot bed>w/c with mod assist for lifting with verbal cues for foot placement and upright posture.  PT propelled pt in w/c for energy conservation to therapy gym and pt performed stand/pivot to therapy mat with mod assist for lifting and verbal cues for upright posture.  PT engaged patient in sit<>stand reaching for horse shoes with RUE, but pt unable to grasp horse shoes at this time 2/2 fatigue and weakness.  PT instructed patient in RUE PNF patterns D1/D2 x10 reps AAROM and x5 reps against resistance for RUE NMR.  Pt transferred back to w/c with mod assist and PT transported pt back to room 2/2 fatigue.  Pt positioned seated in w/c at end of session with call bell in reach and needs met.   Therapy Documentation Precautions:  Precautions Precautions: Fall Restrictions Weight Bearing Restrictions: No Pain:  no c/o pain  See Function Navigator for Current Functional Status.   Therapy/Group: Individual Therapy  Earnest Conroy Penven-Crew 04/07/2015, 3:19 PM

## 2015-04-07 NOTE — Plan of Care (Signed)
Problem: RH KNOWLEDGE DEFICIT Goal: RH STG INCREASE KNOWLEDGE OF HYPERTENSION Outcome: Not Progressing Patient requires staff to verbalize knowledge of hypertension

## 2015-04-07 NOTE — Progress Notes (Signed)
Physical Therapy Session Note  Patient Details  Name: Casey Harrell MRN: [id number] Date of Birth: 1934-12-25  Today's Date: 04/07/2015 PT Individual Time: 0930-1030 Treatment Session 2: 1300-1400 PT Individual Time Calculation (min): 60 min Treatment Session 2: 60 min  Short Term Goals: Week 1:  PT Short Term Goal 1 (Week 1): Patient will perform bed mobility with min A.  PT Short Term Goal 2 (Week 1): Patient will perform bed <> wheelchair transfers with min A.  PT Short Term Goal 3 (Week 1): Patient will ambulate x 10 ft with assist of one person.  PT Short Term Goal 4 (Week 1): Patient will perform dynamic standing balance with mod A x 3 min.  PT Short Term Goal 5 (Week 1): Patient will negotiate up/down 3 stairs using L rail with mod A.   Skilled Therapeutic Interventions/Progress Updates:    Treatment Session 1: Pt received up in w/c with husband present entire session. Therapeutic Activity - see toileting tab for details - Pt req assist for stand-pivot transfer with grab bar to/from toilet, supervision for sit balance while urinating, attempts but is unable to perform hygiene with hemiplegic R hand, so PT performs hygiene, also explaining pt can trial a "mitt" method in the future. Pt washes hands in stand at sink with assist for R hand. Gait Training - PT instructs pt in ambulation with RW and R hand splint req min A from PT and +2 for w/c follow due to low energy: 6' + 10' + 20'. PT instructs pt in ascending/descending 3" height step with B handrails req mod A x 2 steps - PT uses hand over hand placement on R hand to assist in grasping rail and small boost at waist for stability x 2 sets. W/C Management - PT instructs pt and husband in "use it or lose it" neuroplasticity principle. PT instructs pt in locking each brake (R brake has brake extender) req guidance for R hand to be placed correctly on R brake. PT instructs pt in w/c propulsion x 150' with hand-over-hand guidance of R UE req mod  A, as well as simple, verbal cues (2 step sequencing) for technique and steering. Pt ended up in w/c with husband present. Pt is slowly progressing with physical therapy.   Treatment Session 2: Pt received in bed - daughter present. Therapeutic Activity: PT instructs pt in bed mobility req supervision to roll L, CGA and verbal cues for technique to roll R, min A R side lie to sit and CGA sit to R side lie, L side lie to sit req mod A, sit to L side lie req min A, and scooting up in bed req min A. Neuromuscular Reeducation - PT instructs pt in R UE NMR activity and dynamic sit balance - reaching for packets of condiments through min-mod excursions on ipsi- and contra-lateral sides of body req close SBA for balance. Pt becomes dizzy and must lie down. Vitals measured and found to be: BP 143/59 in supine, HR 70 bpm, and SpO2 98%. PT unable to progress this to a standing activity due to pt dizziness when first attempting to sit eob. RN notified, and so PT raised HOB and measured pt's vitals in long sit with back support from bed: BP 113/63, HR 74 bpm, and SpO2 94%. After resting for a few minutes, PT assists pt to eob req min A, then stand-step transfer req mod A to w/c. Therapeutic Exercise: PT instructs pt in R LAQ with 1# ankle weight and  5 second hold at the top x 10 reps. PT educates pt re: energy conservation and pacing, instructing pt to force her self to rest after a few minutes, even if she doesn't feel she needs it. Pt fatigued and assisted back to bed with mod A stand-pivot transfer, then min A sit to supine. Upon lying down, pt closes eyes and c/o dizziness - PT instructs pt to open eyes and notes up-beating nystagmus that abolishes with gaze fixation. PT notified scheduler of needed vestibular evaluation. PT instructs pt in R arm biceps curls with light orange (REP level 1)  X 10 reps. Pt ended in bed visiting with daughter and all needs in reach. Continue per PT POC.    Therapy  Documentation Precautions:  Precautions Precautions: Fall Restrictions Weight Bearing Restrictions: No Vital Signs: AM Session Therapy Vitals Temp: 97.9 F (36.6 C) Temp Source: Oral Pulse Rate: 77 Resp: 18 BP: 125/60 mmHg Patient Position (if appropriate): Sitting Oxygen Therapy SpO2: 96 % O2 Device: Not Delivered Pain: Pain Assessment Pain Assessment: No/denies pain Treatment Session 2: Pt denies pain.   See Function Navigator for Current Functional Status.   Therapy/Group: Individual Therapy  Wenona Mayville M 04/07/2015, 9:44 AM

## 2015-04-07 NOTE — IPOC Note (Signed)
Overall Plan of Care [facility]) Patient Details Name: Casey Harrell MRN: [id number] DOB: [birth year]  Admitting Diagnosis: L CVA  Hospital Problems: Active Problems:   Hypothyroidism   Type 2 diabetes mellitus, uncontrolled (HCC)   Diabetic neuropathy (HCC)   Dyslipidemia   Essential hypertension   GERD   Sleep apnea- C-pap intol   Depression   Embolic stroke involving posterior cerebral artery (HCC)     Functional Problem List: Nursing Edema, Endurance, Medication Management, Pain, Safety, Skin Integrity, Motor, Bladder, Bowel  PT Balance, Endurance, Edema, Motor, Nutrition, Perception, Safety, Sensory  OT Balance, Cognition, Endurance, Motor, Perception, Safety, Sensory  SLP Cognition, Linguistic  TR Endurance, Motor, Safety       Basic ADL's: OT Eating, Grooming, Bathing, Dressing, Toileting     Advanced  ADL's: OT Simple Meal Preparation     Transfers: PT Bed Mobility, Bed to Chair, Car, Occupational psychologist, Research scientist (life sciences): PT Ambulation, Psychologist, prison and probation services, Stairs     Additional Impairments: OT Fuctional Use of Upper Extremity  SLP Communication, Social Cognition expression, comprehension Problem Solving, Memory, Awareness  TR None    Anticipated Outcomes Item Anticipated Outcome  Self Feeding Supervision/setup  Swallowing      Basic self-care  Supervision/setup  Toileting  Supervision/setup   Bathroom Transfers Supervision- Min assist  Bowel/Bladder  pt will be able to manage bowel and bladder with min assist  Transfers  supervision  Locomotion  supervision household ambulatory  Communication  Supervision   Cognition  Supervision-Min A   Pain  Pain below 2  Safety/Judgment  Pt will verbalize awareness of and follow safety precautions    Therapy Plan: PT Intensity: Minimum of 1-2 x/day ,45 to 90 minutes PT Frequency: 5 out of 7 days PT Duration Estimated Length of Stay: 16-18 days OT Intensity: Minimum of 1-2 x/day, 45 to 90  minutes OT Frequency: 5 out of 7 days OT Duration/Estimated Length of Stay: 16-19 days SLP Intensity: Minumum of 1-2 x/day, 30 to 90 minutes SLP Frequency: 3 to 5 out of 7 days SLP Duration/Estimated Length of Stay: 2-2.5 weeks        Team Interventions: Nursing Interventions Patient/Family Education, Bladder Management, Bowel Management, Disease Management/Prevention, Medication Management, Skin Care/Wound Management, Discharge Planning  PT interventions Ambulation/gait training, Balance/vestibular training, Cognitive remediation/compensation, Community reintegration, Discharge planning, Disease management/prevention, DME/adaptive equipment instruction, Functional mobility training, Neuromuscular re-education, Functional electrical stimulation, Patient/family education, Pain management, Stair training, Therapeutic Activities, Therapeutic Exercise, UE/LE Coordination activities, UE/LE Strength taining/ROM, Wheelchair propulsion/positioning, Visual/perceptual remediation/compensation  OT Interventions Warden/ranger, Cognitive remediation/compensation, Discharge planning, Disease mangement/prevention, DME/adaptive equipment instruction, Functional electrical stimulation, Functional mobility training, Neuromuscular re-education, Pain management, Patient/family education, Psychosocial support, Self Care/advanced ADL retraining, Therapeutic Activities, Therapeutic Exercise, UE/LE Strength taining/ROM, UE/LE Coordination activities, Wheelchair propulsion/positioning  SLP Interventions Cognitive remediation/compensation, Cueing hierarchy, Functional tasks, Patient/family education, Therapeutic Activities, Speech/Language facilitation, Internal/external aids, Environmental controls  TR Interventions Adaptive equipment instruction, 1:1 session, Warden/ranger, Functional mobility training, Community reintegration, Cognitive remediation/compensation, Equities trader education, Therapeutic  activities, Recreation/leisure participation, Therapeutic exercise, UE/LE Coordination activities, Wheelchair propulsion/positioning  SW/CM Interventions Discharge Planning, Facilities manager, Patient/Family Education    Team Discharge Planning: Destination: PT-Home ,OT- Home , SLP-Home Projected Follow-up: PT-Home health PT, 24 hour supervision/assistance, OT-  Home health OT, 24 hour supervision/assistance, SLP-24 hour supervision/assistance, Outpatient SLP, Home Health SLP Projected Equipment Needs: PT-To be determined, OT- Tub/shower seat, To be determined, SLP-None recommended by SLP Equipment Details: PT-may need wheelchair, has RW and 4WW, OT-  Patient/family involved in discharge planning: PT- Patient, Family member/caregiver,  OT-Patient, Family member/caregiver, SLP-Patient, Family member/caregiver  MD ELOS: 16-19d Medical Rehab Prognosis:  Good Assessment: 80 y.o. female with a history of dementia admitted on 11/23 with fever, 2 day history of diarrhea and confusion. MRI revealed acute infarcts involving the left periatrial white matter and left temporal-parietal region in addition to chronic lacunar and bilateral occipital infarcts. Pt noted to have acute neuro changes and had syncopal episode 11/25 with resultant aphasia, right upper > lower hemiparesis. Symptoms improved somewhat with bedrest and IVF for relative orthostasis. No acute findings on head CT but repeat MRI revealed interval extension of left periatrial and temporal-parietal infarcts with new infarcts involving the left temporal-parietal and occipital lobe most likely from hypotensive episode in setting of high grade stenosis L-M1 and/or P2 segments. MRA brain revealed high grade stenosis proximal left M1 segment with extensive atheroma throughout posterior circulation and focal severe stenosis Left V4 segment. Carotid dopplers without significant ICA stenosis. 2 D echo with EF 60-65% with grade 2 diastolic dysfunction,  atrial septal lipomatous hypertrophy and calcified MV with trivial regurgitation.   She was started on rocephin for Klebsiella UTI and mentation has improved. EEG revealed mild slowing and no seizure activity. Neurology felt that left punctate strokes likely embolic due to unknown source and given MI stenosis recommended ASA and plavix X 3 months followed by Plavix alone. Dr. Roda ShuttersXu recommends BP goal 130-160   Now requiring 24/7 Rehab RN,MD, as well as CIR level PT, OT and SLP.  Treatment team will focus on ADLs and mobility with goals set at Sup  See Team Conference Notes for weekly updates to the plan of care

## 2015-04-07 NOTE — Patient Care Conference (Signed)
Inpatient RehabilitationTeam Conference and Plan of Care Update Date: 04/07/2015   Time: 11:20 Am    Patient Name: Casey Harrell      Medical Record Number: [id number]  Date of Birth: 1934/06/01 Sex: Female         Room/Bed: 4W17C/4W17C-01 Payor Info: Payor: [employer]UE [employer] MEDICARE / Plan: BCBS MEDICARE / Product Type: *No Product type* /    Admitting Diagnosis: L CVA  Admit Date/Time:  04/05/2015  5:50 PM Admission Comments: No comment available   Primary Diagnosis:  Hemiplegia, unspecified affecting right dominant side (HCC) Principal Problem: Hemiplegia, unspecified affecting right dominant side [facility])  Patient Active Problem List   Diagnosis Date Noted  . Hemiplegia, unspecified affecting right dominant side (HCC) 04/08/2015  . Embolic stroke involving posterior cerebral artery (HCC) 04/05/2015  . Orthostatic hypotension   . Hypertensive urgency 04/01/2015  . Fever in adult 04/01/2015  . Acute encephalopathy 04/01/2015  . Confusion   . CVA (cerebral infarction)   . Posterior circulation stroke (HCC)   . Acute ischemic stroke (HCC) 03/31/2015  . Stroke (HCC) 03/31/2015  . CKD (chronic kidney disease) stage 3, GFR 30-59 ml/min 04/17/2014  . Gastroenteritis, non-infectious 04/11/2014  . Nausea vomiting and diarrhea 04/10/2014  . Hypokalemia 04/10/2014  . Dehydration 04/09/2014  . Bronchitis with airway obstruction (HCC) 02/03/2013  . Leukocytosis 02/03/2013  . Fever 02/03/2013  . Acute respiratory failure with hypoxia (HCC) 02/03/2013  . Depression 02/03/2013  . Hyponatremia 02/03/2013  . Obesity (BMI 30-39.9) 01/10/2013  . PVD (peripheral vascular disease)- S/P RCE 06/18/09 12/13/2012  . Sleep apnea- C-pap intol 12/13/2012  . Low risk cardiac nuclear stress test- 2009 and Feb 2011 12/13/2012  . Mild persistent asthma 11/29/2011  . Chronic cough 06/05/2011  . Diabetic neuropathy (HCC) 09/02/2009  . Dyslipidemia 07/13/2009  . Hypothyroidism 11/12/2008  . Type 2  diabetes mellitus, uncontrolled (HCC) 07/15/2008  . Essential hypertension 07/15/2008  . GERD 07/15/2008  . DIVERTICULOSIS, COLON 07/15/2008    Expected Discharge Date: Expected Discharge Date: 04/21/15  Team Members Present: Physician leading conference: Dr. Claudette Laws Social Worker Present: Dossie Der, LCSW Nurse Present: Chana Bode, RN PT Present: Laurin Coder, PT OT Present: Roney Mans, OT SLP Present: Jackalyn Lombard, SLP PPS Coordinator present : Tora Duck, RN, CRRN     Current Status/Progress Goal Weekly Team Focus  Medical   poor endurance, severe cognitive deficits  home with husbanc asssist  improve endurance work on pacing   Bowel/Bladder   continent bladder UTI incontinent  loose bowel at times.  mod I  monitor episodes of loose bowel . continue taking florastor   Swallow/Nutrition/ Hydration     na        ADL's   mod-max assist transfers, Pt with more RUE movement during functional tasks then with formal assessment.  No bathing and dressing on eval due to already dressed during prior session  Supervision overall, Min assist LB dressing and shower transfer  RUE NMR, transfers, sit > stand, activity tolerance   Mobility   mod A overall transfers, +2 gait (min-mod from PT and +2 for w/c follow), mod A on 3" steps  overall supervision, but min A on stairs  R side NMR, standing balance, improving activity tolerance, safety with transfers   Communication   mild fluent aphasia   supervision   address higher level communication strategies    Safety/Cognition/ Behavioral Observations  mild cognitive deficits for higher level processes   supervision  higher level problem  solving and awareness of deficits    Pain   n/a         Skin   buttocks with MASD barrier cream and microguard powder applied after incontinent episodes  no new breakdown  educate patient and family how to manage MASD and apply barrier cream and MGP prn      *See Care Plan and progress  notes for long and short-term goals.  Barriers to Discharge: pre morbid deconditioning    Possible Resolutions to Barriers:  cont rehab    Discharge Planning/Teaching Needs:  Home with husband and granddaughter will provide 24 hr care at discharge. Husband here observing pt in therapies      Team Discussion:  Goals of supervision/min assist level-husbnad here assisting with her care and seeing her progress. BP issue-adjusting medicines-fatigue an issue-limits her therapies. R-neglect and field cut from stroke. Cognitive delay. Family aware she will need 24 hr care upon discharge.  Revisions to Treatment Plan:  None   Continued Need for Acute Rehabilitation Level of Care: The patient requires daily medical management by a physician with specialized training in physical medicine and rehabilitation for the following conditions: Daily direction of a multidisciplinary physical rehabilitation program to ensure safe treatment while eliciting the highest outcome that is of practical value to the patient.: Yes Daily medical management of patient stability for increased activity during participation in an intensive rehabilitation regime.: Yes Daily analysis of laboratory values and/or radiology reports with any subsequent need for medication adjustment of medical intervention for : Other;Neurological problems  Lucy Chris 04/08/2015, 12:44 PM

## 2015-04-07 NOTE — Progress Notes (Signed)
Social Work Patient ID: Casey Harrell, female   DOB: 07/12/1934, 79 y.o.   MRN: [id number] Met with pt and husband to discuss team conference goals-supervision/min level goals and discharge date 12/14. Pt glad to have a target date to shoot for. Husband assisted with pt doing stairs today and plans to be here to learn her care every step of the way. He is aware grandaugther will need to come in closer to discharge also And learn pt's care. Work together on discharge plan. 

## 2015-04-07 NOTE — Progress Notes (Signed)
Casey Harrell is a 80 y.o. female with a history of dementia admitted on 11/23 with fever, 2 day history of diarrhea and confusion. MRI revealed acute infarcts involving the left periatrial white matter and left temporal-parietal region in addition to chronic lacunar and bilateral occipital infarcts. Pt noted to have acute neuro changes and had syncopal episode 11/25 with resultant aphasia, right upper > lower hemiparesis. Symptoms improved somewhat with bedrest and IVF for relative orthostasis. No acute findings on head CT but repeat MRI revealed interval extension of left periatrial and temporal-parietal infarcts with new infarcts involving the left temporal-parietal and occipital lobe most likely from hypotensive episode in setting of high grade stenosis L-M1 and/or P2 segments. MRA brain revealed high grade stenosis proximal left M1 segment with extensive atheroma throughout posterior circulation and focal severe stenosis Left V4 segment  Subjective/Complaints: Patient slept okay. She was tired from therapy yesterday. Working with OT this morning.  Review of systems no breathing problems no bowel or bladder issues, no joint or muscle pains.  Objective: Vital Signs: Blood pressure 125/60, pulse 77, temperature 97.9 F (36.6 C), temperature source Oral, resp. rate 18, height _0  (1.549 m), weight 67.4 kg (148 lb 9.4 oz), SpO2 96 %. No results found. Results for orders placed or performed during the hospital encounter of 04/05/15 (from the past 72 hour(s))  Glucose, capillary     Status: Abnormal   Collection Time: 04/05/15  9:06 PM  Result Value Ref Range   Glucose-Capillary 275 (H) 65 - 99 mg/dL   Comment 1 Notify RN   Glucose, capillary     Status: Abnormal   Collection Time: 04/06/15  6:26 AM  Result Value Ref Range   Glucose-Capillary 215 (H) 65 - 99 mg/dL   Comment 1 Notify RN   Glucose, capillary     Status: Abnormal   Collection Time: 04/06/15 11:46 AM  Result Value Ref Range   Glucose-Capillary 212 (H) 65 - 99 mg/dL  Glucose, capillary     Status: Abnormal   Collection Time: 04/06/15  4:24 PM  Result Value Ref Range   Glucose-Capillary 116 (H) 65 - 99 mg/dL  Glucose, capillary     Status: Abnormal   Collection Time: 04/06/15  8:31 PM  Result Value Ref Range   Glucose-Capillary 203 (H) 65 - 99 mg/dL  Glucose, capillary     Status: Abnormal   Collection Time: 04/07/15  6:46 AM  Result Value Ref Range   Glucose-Capillary 172 (H) 65 - 99 mg/dL     HEENT: normal Cardio: RRR and no murmur Resp: CTA B/L and unlabored GI: BS positive and NT, ND Extremity:  No Edema Skin:   Intact Neuro: Alert/Oriented, Abnormal Sensory reduced LT, proprio RUE, Abnormal Motor 2- R delt bi tri grip, 4/5 RLE, 5/5 on left side, Abnormal FMC Ataxic/ dec FMC, Inattention and Other R HH Musc/Skel:  Other no pain with UE or LE ROM Gen NAD   Assessment/Plan: 1. Functional deficits secondary to Right hemiparesis, Right homonymous hemianopsia, cognitive deficits related to CVA which require 3+ hours per day of interdisciplinary therapy in a comprehensive inpatient rehab setting. Physiatrist is providing close team supervision and 24 hour management of active medical problems listed below. Physiatrist and rehab team continue to assess barriers to discharge/monitor patient progress toward functional and medical goals. FIM: Function - Bathing Body parts bathed by patient: Right arm, Chest, Abdomen, Front perineal area, Buttocks, Right upper leg, Left upper leg Body parts bathed by helper: Left  arm, Right lower leg, Left lower leg, Back  Function- Upper Body Dressing/Undressing What is the patient wearing?: Bra, Pull over shirt/dress Bra - Perfomed by patient: Thread/unthread left bra strap Bra - Perfomed by helper: Thread/unthread right bra strap, Hook/unhook bra (pull down sports bra) Pull over shirt/dress - Perfomed by patient: Thread/unthread left sleeve, Thread/unthread right sleeve,  Put head through opening Pull over shirt/dress - Perfomed by helper: Pull shirt over trunk Function - Lower Body Dressing/Undressing What is the patient wearing?: Underwear, Pants, Non-skid slipper socks Underwear - Performed by patient: Thread/unthread left underwear leg Underwear - Performed by helper: Thread/unthread right underwear leg, Pull underwear up/down Pants- Performed by patient: Thread/unthread right pants leg Pants- Performed by helper: Thread/unthread left pants leg, Pull pants up/down Non-skid slipper socks- Performed by helper: Don/doff right sock, Don/doff left sock Assist for footwear: Dependant  Function - Toileting Toileting steps completed by patient: Adjust clothing prior to toileting, Performs perineal hygiene Toileting steps completed by helper: Adjust clothing after toileting Toileting Assistive Devices: Grab bar or rail Assist level:  (Mod assist)  Function - Air cabin crew transfer assistive device: Grab bar Assist level to toilet: Moderate assist (Pt 50 - 74%/lift or lower) Assist level from toilet: Moderate assist (Pt 50 - 74%/lift or lower)  Function - Chair/bed transfer Chair/bed transfer method: Stand pivot Chair/bed transfer assist level: Moderate assist (Pt 50 - 74%/lift or lower) Chair/bed transfer assistive device: Armrests Chair/bed transfer details: Manual facilitation for weight shifting, Verbal cues for technique, Verbal cues for sequencing, Verbal cues for precautions/safety  Function - Locomotion: Wheelchair Will patient use wheelchair at discharge?: Yes Type: Manual Max wheelchair distance: 50 ft Assist Level: Touching or steadying assistance (Pt > 75%) Assist Level: Touching or steadying assistance (Pt > 75%) Wheel 150 feet activity did not occur: Safety/medical concerns Turns around,maneuvers to table,bed, and toilet,negotiates 3% grade,maneuvers on rugs and over doorsills: No Function - Locomotion: Ambulation Assistive  device: Hand held assist Max distance: 3 ft Assist level: 2 helpers Walk 10 feet activity did not occur: Safety/medical concerns Walk 50 feet with 2 turns activity did not occur: Safety/medical concerns Walk 150 feet activity did not occur: Safety/medical concerns Walk 10 feet on uneven surfaces activity did not occur: Safety/medical concerns  Function - Comprehension Comprehension: Auditory Comprehension assist level: Understands basic 90% of the time/cues < 10% of the time  Function - Expression Expression: Verbal Expression assist level: Expresses basic 50 - 74% of the time/requires cueing 25 - 49% of the time. Needs to repeat parts of sentences.  Function - Social Interaction Social Interaction assist level: Interacts appropriately 90% of the time - Needs monitoring or encouragement for participation or interaction.  Function - Problem Solving Problem solving assist level: Solves basic 50 - 74% of the time/requires cueing 25 - 49% of the time  Function - Memory Memory assist level: Recognizes or recalls 50 - 74% of the time/requires cueing 25 - 49% of the time Patient normally able to recall (first 3 days only): That he or she is in a hospital, Current season  Medical Problem List and Plan: 1.  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 Formulized individual treatment plan based on medical history, underlying problem and comorbidities. 2.  DVT Prophylaxis/Anticoagulation: Pharmaceutical: Lovenox 3. Pain Management:  Tylenol prn 4. Mood: LCSW to follow for evaluation and support.  5. Neuropsych: This patient is not capable of making decisions onher own behalf. 6. Skin/Wound Care: Routine pressure relief measures. Maintain adequate nutrition and hydration  status 7. Fluids/Electrolytes/Nutrition:  Monitor I/O. Check lytes in am.   8. DM type 2:   Home regimen included Invokana 300 mg, Metformin 1000 mg bid, Levemir 40 mg bid and Novolog 20-10-20 ac. Monitor BS ac/hs and continue Levemir 30 mg bid--add meal coverage and resume low dose metformin bid. am CBG eleated increase levimir again to 35U  9. HTN: Monitor BP every 8 hours.  Check orthostatic BP in am and slowly resume medications as indicated. Continue coreg bid.   HR 77, BP controlled at 125/70 today 10. Diabetic Neuropathy:  Elavil was dc due to concerns of SE--Continue lyrica. and cymbalta 11. Leucocytosis: WBC on upward trend. Will monitor for fevers and other signs of infection.   12. Hypokalemia:  Will supplement for now and recheck in am.  Likely dilutional due to IVF which were d/c today.   13. Klebsiella UTI: On septra DS-- antibiotic D #5/ 5 days  , will d/c 14. OSA: Does not tolerate CPAP 15. Intermittent diarrhea x few months:  No BM for past two days. Will monitor for now.   16. COPD with chronic bronchitis/cough:lung exam normal today  Continue nebs prn.   LOS (Days) 2 A FACE TO FACE EVALUATION WAS PERFORMED  KIRSTEINS,ANDREW E 04/07/2015, 9:46 AM

## 2015-04-07 NOTE — Progress Notes (Signed)
Recreational Therapy Assessment and Plan  Patient Details  Name: Casey Harrell MRN: [id number] Date of Birth: 20-Apr-1935 Today's Date: 04/07/2015  Rehab Potential: Good ELOS: 2 weeks   Assessment Clinical Impression:  Problem List:  Patient Active Problem List   Diagnosis Date Noted  . Embolic stroke involving posterior cerebral artery ([place]) 04/05/2015  . Orthostatic hypotension   . Hypertensive urgency 04/01/2015  . Fever in adult 04/01/2015  . Acute encephalopathy 04/01/2015  . Confusion   . CVA (cerebral infarction)   . Posterior circulation stroke ([place])   . Acute ischemic stroke ([place]) 03/31/2015  . Stroke ([place]) 03/31/2015  . CKD (chronic kidney disease) stage 3, GFR 30-59 ml/min 04/17/2014  . Gastroenteritis, non-infectious 04/11/2014  . Nausea vomiting and diarrhea 04/10/2014  . Hypokalemia 04/10/2014  . Dehydration 04/09/2014  . Bronchitis with airway obstruction ([place]) 02/03/2013  . Leukocytosis 02/03/2013  . Fever 02/03/2013  . Acute respiratory failure with hypoxia ([place]) 02/03/2013  . Depression 02/03/2013  . Hyponatremia 02/03/2013  . Obesity (BMI 30-39.9) 01/10/2013  . PVD (peripheral vascular disease)- S/P RCE 06/18/09 12/13/2012  . Sleep apnea- C-pap intol 12/13/2012  . Low risk cardiac nuclear stress test- 2009 and Feb 2011 12/13/2012  . Mild persistent asthma 11/29/2011  . Chronic cough 06/05/2011  . Diabetic neuropathy ([place]) 09/02/2009  . Dyslipidemia 07/13/2009  . Hypothyroidism 11/12/2008  . Type 2 diabetes mellitus, uncontrolled ([place]) 07/15/2008  . Essential hypertension 07/15/2008  . GERD 07/15/2008  . DIVERTICULOSIS, COLON 07/15/2008    Past Medical History:  Past Medical History  Diagnosis Date  . DIABETES-TYPE 2 07/15/2008  . DIABETIC PERIPHERAL NEUROPATHY 09/02/2009  . HYPERTENSION 07/15/2008  . ASTHMA 07/15/2008  . GERD  07/15/2008  . PVD (peripheral vascular disease) ([place]) 06/18/09    RCE  . Normal cardiac stress test     low risk Nuc 2009, Feb 2011  . COPD (chronic obstructive pulmonary disease) ([place])   . Vertigo    Past Surgical History:  Past Surgical History  Procedure Laterality Date  . Appendectomy  1971  . Abdominal hysterectomy  1971  . Tonsillectomy    . Tubal ligation    . Cesarean section    . Flexible sigmoidoscopy      diverticulitis  . Laparotomy      X 3 with adhesions lysis  . Foot surgery      right foot X 2  . Carotid endarterectomy  06/18/09    RCE    Assessment & Plan Clinical Impression: Casey Harrell is a 80 y.o. female with a history of dementia admitted on 11/23 with fever, 2 day history of diarrhea and confusion. MRI revealed acute infarcts involving the left periatrial white matter and left temporal-parietal region in addition to chronic lacunar and bilateral occipital infarcts. Pt noted to have acute neuro changes and had syncopal episode 11/25 with resultant aphasia, right upper > lower hemiparesis. Symptoms improved somewhat with bedrest and IVF for relative orthostasis. No acute findings on head CT but repeat MRI revealed interval extension of left periatrial and temporal-parietal infarcts with new infarcts involving the left temporal-parietal and occipital lobe most likely from hypotensive episode in setting of high grade stenosis L-M1 and/or P2 segments. MRA brain revealed high grade stenosis proximal left M1 segment with extensive atheroma throughout posterior circulation and focal severe stenosis Left V4 segment. Carotid dopplers without significant ICA stenosis. 2 D echo with EF 60-65% with grade 2 diastolic dysfunction, atrial septal lipomatous hypertrophy and calcified MV with trivial regurgitation.  She was started on rocephin for Klebsiella UTI and mentation has improved. EEG revealed mild slowing  and no seizure activity. Neurology felt that left punctate strokes likely embolic due to unknown source and given MI stenosis recommended ASA and plavix X 3 months followed by Plavix alone. Dr. Erlinda Hong recommends BP goal 130-160 and blood pressure medication adjusted. Patient continues to be limited by RUE weakness with decreased balance, unsteady gait, lacks awareness and insight into deficits and mild fluent aphasia complicated by baseline dementia. Patient transferred to CIR on 04/05/2015.      Pt presents with decreased activity tolerance, decreased functional mobility, decreased balance, decreased coordination, decreased awareness, decreased safety awareness Limiting pt's independence with leisure/community pursuits.   Leisure History/Participation Premorbid leisure interest/current participation: Medical laboratory scientific officer - Psychologist, forensic;Nature - Vegetable gardening;Nature - Flower gardening;Nature Careers adviser care Other Leisure Interests: Cooking/Baking;Housework Leisure Participation Style: With Family/Friends Awareness of Community Resources: Good-identify 3 post discharge leisure resources Psychosocial / Spiritual Spiritual Interests: Church Patient agreeable to Pet Therapy: Yes Does patient have pets?: Yes (2 cats outside) Social interaction - Mood/Behavior: Cooperative Academic librarian Appropriate for Education?: Yes Patient Agreeable to [employer]?: Yes Recreational Therapy Orientation Orientation -Reviewed with patient: Available activity resources Strengths/Weaknesses Patient Strengths/Abilities: Willingness to participate;Active premorbidly Patient weaknesses: Physical limitations TR Patient demonstrates impairments in the following area(s): Endurance;Motor;Safety TR Additional Impairment(s): None  Plan Rec Therapy Plan Is patient appropriate for Therapeutic Recreation?: Yes Rehab Potential: Good Treatment times per week: Min 1 time per week >20  minutes Estimated Length of Stay: 2 weeks TR Treatment/Interventions: Adaptive equipment instruction;1:1 session;Balance/vestibular training;Functional mobility training;Community reintegration;Cognitive remediation/compensation;Patient/family education;Therapeutic activities;Recreation/leisure participation;Therapeutic exercise;UE/LE Coordination activities;Wheelchair propulsion/positioning  Recommendations for other services: None  Discharge Criteria: Patient will be discharged from TR if patient refuses treatment 3 consecutive times without medical reason.  If treatment goals not met, if there is a change in medical status, if patient makes no progress towards goals or if patient is discharged from hospital.  The above assessment, treatment plan, treatment alternatives and goals were discussed and mutually agreed upon: by patient  [place] 04/07/2015, 11:15 AM

## 2015-04-07 NOTE — Progress Notes (Signed)
Speech Language Pathology Daily Session Note  Patient Details  Name: Casey Harrell MRN: [id number] Date of Birth: 04/16/1935  Today's Date: 04/07/2015 SLP Individual Time: 1510-1534 SLP Individual Time Calculation (min): 24 min  Short Term Goals: Week 1: SLP Short Term Goal 1 (Week 1): Patient will utilize word-finding strategies with Min A multimodal cues at the sentence level to express basic wants/needs.  SLP Short Term Goal 2 (Week 1): Patient will complete generative naming tasks with Min A multimodal cues.  SLP Short Term Goal 3 (Week 1): Patient will follow mildly complex commands (2-3 step) with extra time and Min A multimodal cues.  SLP Short Term Goal 4 (Week 1): Patient will complete mildly complex and familiar problem solving tasks with Min A multimodal cues.  SLP Short Term Goal 5 (Week 1): Patient will self-monitor and correct errors during functional tasks with Min A question cues.  SLP Short Term Goal 6 (Week 1): Patient will utilize external memory aids to recall new, daily information with Min A question and verbal cues.   Skilled Therapeutic Interventions:  Pt was seen for skilled ST targeting cognitive goals.  SLP facilitated the session with a medication management task targeting use of external memory aids to facilitate recall of new information.  Pt recalled names and function of previously known medications from prior to admission with supervision question cues; however, she required total assist for recall of newly scheduled medications.  SLP generated a written list to assist in recall of new meds which pt was able to utilize to identify names and functions of medications with min assist verbal and visual cues for ~80% accuracy.  Recommend addressing functional problem solving for medication management with use of a pill box at next available appointment.  Pt was left upright in wheelchair with daughter present at bedside.  Continue per current plan of care.    Function:  Eating Eating                 Cognition Comprehension Comprehension assist level: Understands basic 90% of the time/cues < 10% of the time  Expression   Expression assist level: Expresses basic 50 - 74% of the time/requires cueing 25 - 49% of the time. Needs to repeat parts of sentences.  Social Interaction Social Interaction assist level: Interacts appropriately 90% of the time - Needs monitoring or encouragement for participation or interaction.  Problem Solving Problem solving assist level: Solves basic 50 - 74% of the time/requires cueing 25 - 49% of the time  Memory Memory assist level: Recognizes or recalls 50 - 74% of the time/requires cueing 25 - 49% of the time    Pain Pain Assessment Pain Assessment: No/denies pain  Therapy/Group: Individual Therapy  Casey Harrell, Melanee SpryNicole L 04/07/2015, 3:39 PM

## 2015-04-08 ENCOUNTER — Inpatient Hospital Stay (HOSPITAL_COMMUNITY): Payer: Medicare Other

## 2015-04-08 ENCOUNTER — Inpatient Hospital Stay (HOSPITAL_COMMUNITY): Payer: Medicare Other | Admitting: Physical Therapy

## 2015-04-08 ENCOUNTER — Inpatient Hospital Stay (HOSPITAL_COMMUNITY): Payer: Medicare Other | Admitting: Speech Pathology

## 2015-04-08 DIAGNOSIS — G8191 Hemiplegia, unspecified affecting right dominant side: Secondary | ICD-10-CM

## 2015-04-08 LAB — GLUCOSE, CAPILLARY
Glucose-Capillary: 98 mg/dL (ref 65–99)
Glucose-Capillary: 417 mg/dL — ABNORMAL HIGH (ref 65–99)
Glucose-Capillary: 198 mg/dL — ABNORMAL HIGH (ref 65–99)
Glucose-Capillary: 189 mg/dL — ABNORMAL HIGH (ref 65–99)

## 2015-04-08 NOTE — Progress Notes (Signed)
Subjective/Complaints: Patient had several loose stools  On Tuesday none during the day on Wednesday but reports once the evening having had loose stools. Her husband states that she has had problems at home with diarrhea intermittently. Review of medications performed. She has been on metformin at home and recently restarted here.Antibiotics discontinued on Tuesday  Review of systems no breathing problems no bowel or bladder issues, no joint or muscle pains.  Objective: Vital Signs: Blood pressure 147/58, pulse 76, temperature 97.6 F (36.4 C), temperature source Oral, resp. rate 18, height '5\' 1"'  (1.549 m), weight 67.5 kg (148 lb 13 oz), SpO2 95 %. No results found. Results for orders placed or performed during the hospital encounter of 04/05/15 (from the past 72 hour(s))  Glucose, capillary     Status: Abnormal   Collection Time: 04/05/15  9:06 PM  Result Value Ref Range   Glucose-Capillary 275 (H) 65 - 99 mg/dL   Comment 1 Notify RN   Glucose, capillary     Status: Abnormal   Collection Time: 04/06/15  6:26 AM  Result Value Ref Range   Glucose-Capillary 215 (H) 65 - 99 mg/dL   Comment 1 Notify RN   Glucose, capillary     Status: Abnormal   Collection Time: 04/06/15 11:46 AM  Result Value Ref Range   Glucose-Capillary 212 (H) 65 - 99 mg/dL  Glucose, capillary     Status: Abnormal   Collection Time: 04/06/15  4:24 PM  Result Value Ref Range   Glucose-Capillary 116 (H) 65 - 99 mg/dL  Glucose, capillary     Status: Abnormal   Collection Time: 04/06/15  8:31 PM  Result Value Ref Range   Glucose-Capillary 203 (H) 65 - 99 mg/dL  Glucose, capillary     Status: Abnormal   Collection Time: 04/07/15  6:46 AM  Result Value Ref Range   Glucose-Capillary 172 (H) 65 - 99 mg/dL  Glucose, capillary     Status: Abnormal   Collection Time: 04/07/15 12:19 PM  Result Value Ref Range   Glucose-Capillary 163 (H) 65 - 99 mg/dL   Comment 1 Notify RN   Glucose, capillary     Status: Abnormal    Collection Time: 04/07/15  5:12 PM  Result Value Ref Range   Glucose-Capillary 223 (H) 65 - 99 mg/dL   Comment 1 Notify RN   Glucose, capillary     Status: Abnormal   Collection Time: 04/07/15  9:04 PM  Result Value Ref Range   Glucose-Capillary 250 (H) 65 - 99 mg/dL  Glucose, capillary     Status: None   Collection Time: 04/08/15  6:42 AM  Result Value Ref Range   Glucose-Capillary 98 65 - 99 mg/dL     HEENT: normal Cardio: RRR and no murmur Resp: CTA B/L and unlabored GI: BS positive and NT, ND Extremity:  No Edema Skin:   Intact Neuro: Alert/Oriented, Abnormal Sensory reduced LT, proprio RUE, Abnormal Motor 2- R delt bi tri grip, 4/5 RLE, 5/5 on left side, Abnormal FMC Ataxic/ dec FMC, Inattention and Other R HH Musc/Skel:  Other no pain with UE or LE ROM Gen NAD   Assessment/Plan: 1. Functional deficits secondary to Right hemiparesis, Right homonymous hemianopsia, cognitive deficits related to CVA which require 3+ hours per day of interdisciplinary therapy in a comprehensive inpatient rehab setting. Physiatrist is providing close team supervision and 24 hour management of active medical problems listed below. Physiatrist and rehab team continue to assess barriers to discharge/monitor patient progress toward  functional and medical goals. FIM: Function - Bathing Body parts bathed by patient: Right arm, Chest, Abdomen, Front perineal area, Buttocks, Right upper leg, Left upper leg Body parts bathed by helper: Left arm, Right lower leg, Left lower leg, Back  Function- Upper Body Dressing/Undressing What is the patient wearing?: Bra, Pull over shirt/dress Bra - Perfomed by patient: Thread/unthread left bra strap Bra - Perfomed by helper: Thread/unthread right bra strap, Hook/unhook bra (pull down sports bra) Pull over shirt/dress - Perfomed by patient: Thread/unthread left sleeve, Thread/unthread right sleeve, Put head through opening Pull over shirt/dress - Perfomed by  helper: Pull shirt over trunk Function - Lower Body Dressing/Undressing What is the patient wearing?: Underwear, Pants, Non-skid slipper socks Underwear - Performed by patient: Thread/unthread left underwear leg Underwear - Performed by helper: Thread/unthread right underwear leg, Pull underwear up/down Pants- Performed by patient: Thread/unthread right pants leg Pants- Performed by helper: Thread/unthread left pants leg, Pull pants up/down Non-skid slipper socks- Performed by helper: Don/doff right sock, Don/doff left sock Assist for footwear: Dependant  Function - Toileting Toileting steps completed by patient: Adjust clothing prior to toileting, Performs perineal hygiene Toileting steps completed by helper: Adjust clothing prior to toileting, Performs perineal hygiene, Adjust clothing after toileting Toileting Assistive Devices: Grab bar or rail Assist level: Touching or steadying assistance (Pt.75%)  Function - Air cabin crew transfer assistive device: Grab bar Assist level to toilet: Touching or steadying assistance (Pt > 75%) Assist level from toilet: Touching or steadying assistance (Pt > 75%)  Function - Chair/bed transfer Chair/bed transfer method: Stand pivot Chair/bed transfer assist level: Moderate assist (Pt 50 - 74%/lift or lower) Chair/bed transfer assistive device: Armrests Chair/bed transfer details: Manual facilitation for weight shifting, Verbal cues for technique, Verbal cues for sequencing, Verbal cues for precautions/safety, Manual facilitation for placement  Function - Locomotion: Wheelchair Will patient use wheelchair at discharge?: Yes Type: Manual Max wheelchair distance: 150' Assist Level: Moderate assistance (Pt 50 - 74%) Assist Level: Moderate assistance (Pt 50 - 74%) Wheel 150 feet activity did not occur: Safety/medical concerns Assist Level: Moderate assistance (Pt 50 - 74%) Turns around,maneuvers to table,bed, and toilet,negotiates 3%  grade,maneuvers on rugs and over doorsills: No Function - Locomotion: Ambulation Assistive device: Walker-platform, Orthosis (R hand splint) Max distance: 20' Assist level: 2 helpers Walk 10 feet activity did not occur: Safety/medical concerns Assist level: 2 helpers Walk 50 feet with 2 turns activity did not occur: Safety/medical concerns Walk 150 feet activity did not occur: Safety/medical concerns Walk 10 feet on uneven surfaces activity did not occur: Safety/medical concerns  Function - Comprehension Comprehension: Auditory Comprehension assist level: Understands basic 90% of the time/cues < 10% of the time  Function - Expression Expression: Verbal Expression assist level: Expresses basic 50 - 74% of the time/requires cueing 25 - 49% of the time. Needs to repeat parts of sentences.  Function - Social Interaction Social Interaction assist level: Interacts appropriately 90% of the time - Needs monitoring or encouragement for participation or interaction.  Function - Problem Solving Problem solving assist level: Solves basic 50 - 74% of the time/requires cueing 25 - 49% of the time  Function - Memory Memory assist level: Recognizes or recalls 50 - 74% of the time/requires cueing 25 - 49% of the time Patient normally able to recall (first 3 days only): That he or she is in a hospital, Current season  Medical Problem List and Plan: 1.  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 Formulized individual treatment plan based on medical history, underlying problem and comorbidities. 2.  DVT Prophylaxis/Anticoagulation: Pharmaceutical: Lovenox 3. Pain Management:  Tylenol prn 4. Mood: LCSW to follow for evaluation and support.   5. Neuropsych: This patient is not  capable of making decisions onher own behalf. 6. Skin/Wound Care: Routine pressure relief measures. Maintain adequate nutrition and hydration status 7. Fluids/Electrolytes/Nutrition:  Monitor I/O. Check lytes in am.   8. DM type 2:   Home regimen included Invokana 300 mg, Metformin 1000 mg bid, Levemir 40 mg bid and Novolog 20-10-20 ac. Monitor BS ac/hs and  levimir 35U BID, will d/c metformin and monitor bowel pattern 9. HTN: Monitor BP every 8 hours.  Check orthostatic BP in am and slowly resume medications as indicated. Continue coreg bid.   HR 77, BP controlled at 125/70 today 10. Diabetic Neuropathy:  Elavil was dc due to concerns of SE--Continue lyrica. and cymbalta 11. Leucocytosis: WBC on upward trend. Will monitor for fevers and other signs of infection.   12. Hypokalemia:  Will supplement for now and recheck in am.  Likely dilutional due to IVF which were d/c today.   13. Klebsiella UTI: resolved 14. OSA: Does not tolerate CPAP 15. Intermittent diarrhea x few months:  May be metformin related, will stop and monitor 16. COPD with chronic bronchitis/cough:lung exam normal today  Continue nebs prn.   LOS (Days) 3 A FACE TO FACE EVALUATION WAS PERFORMED  Tris Howell E 04/08/2015, 8:00 AM

## 2015-04-08 NOTE — Progress Notes (Signed)
Physical Therapy Session Note  Patient Details  Name: Casey Harrell F Kadow MRN: [id number] Date of Birth: Jul 05, 1934  Today's Date: 04/08/2015 PT Individual Time: 1300-1345 PT Individual Time Calculation (min): 45 min   Short Term Goals: Week 1:  PT Short Term Goal 1 (Week 1): Patient will perform bed mobility with min A.  PT Short Term Goal 2 (Week 1): Patient will perform bed <> wheelchair transfers with min A.  PT Short Term Goal 3 (Week 1): Patient will ambulate x 10 ft with assist of one person.  PT Short Term Goal 4 (Week 1): Patient will perform dynamic standing balance with mod A x 3 min.  PT Short Term Goal 5 (Week 1): Patient will negotiate up/down 3 stairs using L rail with mod A.   Skilled Therapeutic Interventions/Progress Updates:    Therapeutic Activity - Pt received on toilet - see functioning tab for details. Pt req assist for posterior hygiene and pants management, but participates in pants management. After failed 2nd attempt to ambulate, PT assists pt back to bed via squat- pivot transfer req min A, then min A sit to supine. Pt immediately dizzy, nystagmus last < 15 seconds noted and symptoms abate. Gait Training: PT instructs pt in ambulation with RW x 50' req min A from PT and +2 for w/c follow. After seated rest break for a few minutes, on 2nd attempt to stand, pt becomes extremely dizzy with LOB backwards, then assisted to sit in w/c, almost passing out. Pt's level of alertness immediately improved, vitals taken and found to be: 123/64 mmHg which is a significant drop from 152/82 after bout of ambulation. Neuromuscular Reeducation - PT instructs pt in R UE PNF D1 diagonls x 5 + 3 reps. Pt very fatigued, so vitals taken and found to be: 125/52 in supine, HR 75, SpO2 95% on RA. PT brought pt a stress ball and instructs her in R hand grips 3 x 10 reps. Pt ended in bed and passed off to OT. Continue per PT POC.   Therapy Documentation Precautions:  Precautions Precautions:  Fall Restrictions Weight Bearing Restrictions: No  Pain: Pain Assessment Pain Assessment: No/denies pain Pain Score: 0-No pain   See Function Navigator for Current Functional Status.   Therapy/Group: Individual Therapy  Karalee Hauter M 04/08/2015, 1:05 PM

## 2015-04-08 NOTE — Progress Notes (Signed)
Occupational Therapy Session Note  Patient Details  Name: Casey Harrell MRN: [id number] Date of Birth: 02/16/1935  Today's Date: 04/08/2015 OT Individual Time: [id number] OT Individual Time Calculation (min): 60 min    Short Term Goals: Week 1:  OT Short Term Goal 1 (Week 1): Pt will complete stand pivot transfer to toilet with min assist OT Short Term Goal 2 (Week 1): Pt will complete shower transfers with min assist with LRAD OT Short Term Goal 3 (Week 1): Pt will complete LB dressing with mod assist OT Short Term Goal 4 (Week 1): Pt will complete UB dressing with min assist OT Short Term Goal 5 (Week 1): Pt will complete 1 grooming task in standing with min assist  Skilled Therapeutic Interventions/Progress Updates: ADL-retraining at shower level with focus on transfers, dynamic sitting balance, static standing balance, and instruction on use of wash mitt and LH sponge.   Pt requires overall mod assist to rise to edge of bed but only min assist to complete stand-pivot transfer to w/c.   Pt toilets and transfers to tub bench with min assist.   Pt bathes with overall min assist for thoroughness (at buttocks).   Pt returns to w/c in order to dress at edge of bed but requests assist with toilet transfer again prior to dressing.  [facility] and [facility] until end of session.   RN advised on rash and RN tech was requested to assist pt with dressing after she completed toileting.       Therapy Documentation Precautions:  Precautions Precautions: Fall Restrictions Weight Bearing Restrictions: No  Pain: No/denies pain   See Function Navigator for Current Functional Status.   Therapy/Group: Individual Therapy   Second session: Time: 1345-1430 Time Calculation (min): 45 min  Pain Assessment: No/denies pain  Skilled Therapeutic Interventions: Therapeutic activity with focus on NMR of RUE during functional task: making pudding in kitchen, improved attention, dynamic  standing balance, activity tolerance, and family education (husband present).   Pt received supine in bed, HOB elevated after physical therapy session, reporting episode of vertigo with fatigue.   Pt educated on use of DME to improve positioningl while supine and to enhance bed mobility.   Pt receptive for kitchen task presented and was escorted to kitchen in w/c.   Pt completed food prep standing at countertop with overall mod assist to manage utensils while incorporating use of right hand, hand-over-hand guidance provided by therapist.   Pt attempts use of right hand throughout task however lacks grip strength to hold objects unassisted.   Pt recovers to w/c and return to bed at end of session with mod assist to lift legs into bed.      See FIM for current functional status  Therapy/Group: Individual Therapy  Esa Raden 04/08/2015, 10:12 AM

## 2015-04-08 NOTE — Progress Notes (Signed)
Speech Language Pathology Daily Session Note  Patient Details  Name: Casey Harrell MRN: [id number] Date of Birth: April 20, 1935  Today's Date: 04/08/2015 SLP Individual Time: 1003-1100 SLP Individual Time Calculation (min): 57 min  Short Term Goals: Week 1: SLP Short Term Goal 1 (Week 1): Patient will utilize word-finding strategies with Min A multimodal cues at the sentence level to express basic wants/needs.  SLP Short Term Goal 2 (Week 1): Patient will complete generative naming tasks with Min A multimodal cues.  SLP Short Term Goal 3 (Week 1): Patient will follow mildly complex commands (2-3 step) with extra time and Min A multimodal cues.  SLP Short Term Goal 4 (Week 1): Patient will complete mildly complex and familiar problem solving tasks with Min A multimodal cues.  SLP Short Term Goal 5 (Week 1): Patient will self-monitor and correct errors during functional tasks with Min A question cues.  SLP Short Term Goal 6 (Week 1): Patient will utilize external memory aids to recall new, daily information with Min A question and verbal cues.   Skilled Therapeutic Interventions:  Pt was seen for skilled ST targeting cognitive goals.  Pt required mod-max assist question cues for recall of yesterday's therapy activities.  SLP facilitated the session with a medication management task with the use of a pill box to address functional problem solving for familiar home management tasks.  SLP provided demonstration cues regarding use of pill box.  Pt initially required max assist instructional cues to load pills of varying dosages and frequencies into a pill box; however, as task progressed and SLP reinforced use of associative memory compensatory strategies, SLP was able to fade cues to min assist verbal cues.  SLP provided brief education with pt's husband regarding checking behind pt for accuracy when using pill box at home.  Pt left in wheelchair with husband at bedside and call bell left within reach.     Function:  Eating Eating                 Cognition Comprehension Comprehension assist level: Understands basic 90% of the time/cues < 10% of the time  Expression   Expression assist level: Expresses basic 50 - 74% of the time/requires cueing 25 - 49% of the time. Needs to repeat parts of sentences.  Social Interaction Social Interaction assist level: Interacts appropriately 90% of the time - Needs monitoring or encouragement for participation or interaction.  Problem Solving Problem solving assist level: Solves basic 50 - 74% of the time/requires cueing 25 - 49% of the time  Memory Memory assist level: Recognizes or recalls 50 - 74% of the time/requires cueing 25 - 49% of the time    Pain Pain Assessment Pain Assessment: No/denies pain  Therapy/Group: Individual Therapy  Ilhan Debenedetto, Melanee SpryNicole L 04/08/2015, 3:47 PM

## 2015-04-09 ENCOUNTER — Inpatient Hospital Stay (HOSPITAL_COMMUNITY): Payer: Medicare Other | Admitting: Speech Pathology

## 2015-04-09 ENCOUNTER — Inpatient Hospital Stay (HOSPITAL_COMMUNITY): Payer: Medicare Other

## 2015-04-09 ENCOUNTER — Inpatient Hospital Stay (HOSPITAL_COMMUNITY): Payer: Medicare Other | Admitting: Physical Therapy

## 2015-04-09 LAB — GLUCOSE, CAPILLARY
Glucose-Capillary: 136 mg/dL — ABNORMAL HIGH (ref 65–99)
Glucose-Capillary: 197 mg/dL — ABNORMAL HIGH (ref 65–99)
Glucose-Capillary: 218 mg/dL — ABNORMAL HIGH (ref 65–99)
Glucose-Capillary: 188 mg/dL — ABNORMAL HIGH (ref 65–99)

## 2015-04-09 MED ORDER — CAMPHOR-MENTHOL 0.5-0.5 % EX LOTN
TOPICAL_LOTION | Freq: Three times a day (TID) | CUTANEOUS | Status: DC
  Administered 2015-04-09 – 2015-04-14 (×14): via TOPICAL
  Administered 2015-04-14: 1 via TOPICAL
  Administered 2015-04-16 (×2): via TOPICAL
  Administered 2015-04-16: 1 via TOPICAL
  Administered 2015-04-20: 12:00:00 via TOPICAL
  Filled 2015-04-09: qty 222

## 2015-04-09 MED ORDER — TRIAMCINOLONE ACETONIDE 0.1 % EX LOTN
TOPICAL_LOTION | Freq: Three times a day (TID) | CUTANEOUS | Status: DC
  Administered 2015-04-09 – 2015-04-13 (×10): via TOPICAL
  Administered 2015-04-13: 1 via TOPICAL
  Administered 2015-04-14: 13:00:00 via TOPICAL
  Administered 2015-04-15: 1 via TOPICAL
  Filled 2015-04-09 (×2): qty 60

## 2015-04-09 MED ORDER — HYDROCERIN EX CREA
TOPICAL_CREAM | Freq: Two times a day (BID) | CUTANEOUS | Status: DC
  Administered 2015-04-09 – 2015-04-10 (×3): via TOPICAL
  Administered 2015-04-10: 1 via TOPICAL
  Administered 2015-04-11 – 2015-04-12 (×4): via TOPICAL
  Administered 2015-04-13: 1 via TOPICAL
  Administered 2015-04-13 – 2015-04-14 (×3): via TOPICAL
  Administered 2015-04-15: 1 via TOPICAL
  Administered 2015-04-15 – 2015-04-20 (×7): via TOPICAL
  Filled 2015-04-09: qty 113

## 2015-04-09 NOTE — Progress Notes (Signed)
Physical Therapy Session Note  Patient Details  Name: Casey Harrell MRN: [id number] Date of Birth: [birth year]  Today's Date: 04/09/2015 PT Individual Time: 1100-1200 Treatment Session 2: 1530-1600 PT Individual Time Calculation (min): 60 min Treatment Session 2: 30 min  Short Term Goals: Week 1:  PT Short Term Goal 1 (Week 1): Patient will perform bed mobility with min A.  PT Short Term Goal 2 (Week 1): Patient will perform bed <> wheelchair transfers with min A.  PT Short Term Goal 3 (Week 1): Patient will ambulate x 10 ft with assist of one person.  PT Short Term Goal 4 (Week 1): Patient will perform dynamic standing balance with mod A x 3 min.  PT Short Term Goal 5 (Week 1): Patient will negotiate up/down 3 stairs using L rail with mod A.   Skilled Therapeutic Interventions/Progress Updates:    Treatment Session 1: Pt received on toilet, after being transferred there with nurse tech. Therapeutic Activity - PT instructs pt to let her know when she is done going to the bathroom. PT sees pt stand from toilet without her using grab bar, PT rushes in and assists pt with posterior hygiene and pants/underwear management. PT instructs pt in car transfer without AD in TranSit system at simulated Ford sedan height req min A and verbal cues for technique - pt reports slightly lightheaded after transferring back into w/c, but no near-syncope. Gait Training - PT instructs pt in ambulation with RW and R hand splint x 70' req CGA from PT and +2 for w/c follow for safety. Stair Training - PT instructs pt in ascending/descending a single step with B handrails req min A x 5 reps in a row (6" height) - pt req assist to reposition R hand once while performing this activity due to it sliding medially. Neuromuscular Reeducation - PT instructs pt in R side NMR activities with functional component: B UE w/c propelling with PT providing hand-over-hand assist to R UE and verbal cues for technique x 70' req min-mod A. Pt  then transitions to B LE w/c propulsion with verbal cues for technique x 30' req min-SBA, intermittently. PT instructs pt in R UE NMR activities: grip with a stress ball x 10 reps total, bicep curls with light Orange Rep band (level 1) x 10 reps - initially req PT assist to complete full curl and for wrist stabilization in neutral, progressing to not needing PT assist. Pt ended up in w/c with family present, R arm supported on half lap tray, awaiting lunch. Continue per PT POC.   Treatment Session 2: Pt received up in w/c after toileting with nurse tech. PT assists pt in transfer back to bed. Neuromuscular Reeducation - PT instructs pt in R side NMR activities: min-mod resisted heel slides with hip/knee extenxion, PNF D1 flexion/extension with R UE, min-mod resisted hip abduction/adduction, PNF D2 flexion/extension, PNF diagonals at knees in B hook lie to activate abdominal muscles - each diagonal with mod resistance, arom arm flexion (PT stops arm from flopping back into pillow): x 10 reps each. Pt ended in bed with husband present and all needs in reach. Pt's R arm is continuing to demonstrate increasing motor return and control, daily. Continue per PT POC.    Therapy Documentation Precautions:  Precautions Precautions: Fall Restrictions Weight Bearing Restrictions: No Vital Signs: see flow sheet  Pain: Pain Assessment Pain Assessment: No/denies pain Treatment Session 2: Pt denies pain.   See Function Navigator for Current Functional Status.   Therapy/Group:  Individual Therapy  Casey Harrell M 04/09/2015, 11:03 AM

## 2015-04-09 NOTE — Progress Notes (Signed)
Subjective/Complaints: Patient visiting with sister-in-law at bedside.  Review of systems no breathing problems no bowel or bladder issues, no joint or muscle pains.  Objective: Vital Signs: Blood pressure 156/76, pulse 67, temperature 97.9 F (36.6 C), temperature source Oral, resp. rate 18, height _0  (1.549 m), weight 67.5 kg (148 lb 13 oz), SpO2 95 %. No results found. Results for orders placed or performed during the hospital encounter of 04/05/15 (from the past 72 hour(s))  Glucose, capillary     Status: Abnormal   Collection Time: 04/06/15 11:46 AM  Result Value Ref Range   Glucose-Capillary 212 (H) 65 - 99 mg/dL  Glucose, capillary     Status: Abnormal   Collection Time: 04/06/15  4:24 PM  Result Value Ref Range   Glucose-Capillary 116 (H) 65 - 99 mg/dL  Glucose, capillary     Status: Abnormal   Collection Time: 04/06/15  8:31 PM  Result Value Ref Range   Glucose-Capillary 203 (H) 65 - 99 mg/dL  Glucose, capillary     Status: Abnormal   Collection Time: 04/07/15  6:46 AM  Result Value Ref Range   Glucose-Capillary 172 (H) 65 - 99 mg/dL  Glucose, capillary     Status: Abnormal   Collection Time: 04/07/15 12:19 PM  Result Value Ref Range   Glucose-Capillary 163 (H) 65 - 99 mg/dL   Comment 1 Notify RN   Glucose, capillary     Status: Abnormal   Collection Time: 04/07/15  5:12 PM  Result Value Ref Range   Glucose-Capillary 223 (H) 65 - 99 mg/dL   Comment 1 Notify RN   Glucose, capillary     Status: Abnormal   Collection Time: 04/07/15  9:04 PM  Result Value Ref Range   Glucose-Capillary 250 (H) 65 - 99 mg/dL  Glucose, capillary     Status: None   Collection Time: 04/08/15  6:42 AM  Result Value Ref Range   Glucose-Capillary 98 65 - 99 mg/dL  Glucose, capillary     Status: Abnormal   Collection Time: 04/08/15 12:03 PM  Result Value Ref Range   Glucose-Capillary 417 (H) 65 - 99 mg/dL   Comment 1 Notify RN   Glucose, capillary     Status: Abnormal   Collection Time: 04/08/15  4:52 PM  Result Value Ref Range   Glucose-Capillary 198 (H) 65 - 99 mg/dL   Comment 1 Notify RN   Glucose, capillary     Status: Abnormal   Collection Time: 04/08/15  8:39 PM  Result Value Ref Range   Glucose-Capillary 189 (H) 65 - 99 mg/dL  Glucose, capillary     Status: Abnormal   Collection Time: 04/09/15  6:54 AM  Result Value Ref Range   Glucose-Capillary 136 (H) 65 - 99 mg/dL     HEENT: normal Cardio: RRR and no murmur Resp: CTA B/L and unlabored GI: BS positive and NT, ND Extremity:  No Edema Skin:   Intact Neuro: Alert/Oriented, Abnormal Sensory reduced LT, proprio RUE, Abnormal Motor 2- R delt bi tri grip, 4/5 RLE, 5/5 on left side, Abnormal FMC Ataxic/ dec FMC, Inattention and Other R HH Musc/Skel:  Other no pain with UE or LE ROM Gen NAD   Assessment/Plan: 1. Functional deficits secondary to Right hemiparesis, Right homonymous hemianopsia, cognitive deficits related to CVA which require 3+ hours per day of interdisciplinary therapy in a comprehensive inpatient rehab setting. Physiatrist is providing close team supervision and 24 hour management of active medical problems listed below. Physiatrist  and rehab team continue to assess barriers to discharge/monitor patient progress toward functional and medical goals. FIM: Function - Bathing Position: Shower Body parts bathed by patient: Right arm, Left arm, Chest, Abdomen, Front perineal area, Buttocks, Right upper leg, Left upper leg, Right lower leg, Left lower leg Body parts bathed by helper: Buttocks, Back Assist Level: Touching or steadying assistance(Pt > 75%)  Function- Upper Body Dressing/Undressing What is the patient wearing?: Bra, Pull over shirt/dress Bra - Perfomed by patient: Thread/unthread left bra strap Bra - Perfomed by helper: Thread/unthread right bra strap, Hook/unhook bra (pull down sports bra) Pull over shirt/dress - Perfomed by patient: Thread/unthread left sleeve,  Thread/unthread right sleeve, Put head through opening Pull over shirt/dress - Perfomed by helper: Pull shirt over trunk Function - Lower Body Dressing/Undressing What is the patient wearing?: Underwear, Pants, Non-skid slipper socks Underwear - Performed by patient: Thread/unthread left underwear leg Underwear - Performed by helper: Thread/unthread right underwear leg, Pull underwear up/down Pants- Performed by patient: Thread/unthread right pants leg Pants- Performed by helper: Thread/unthread left pants leg, Pull pants up/down Non-skid slipper socks- Performed by helper: Don/doff right sock, Don/doff left sock Assist for footwear: Dependant  Function - Toileting Toileting steps completed by patient: Performs perineal hygiene, Adjust clothing after toileting Toileting steps completed by helper: Performs perineal hygiene, Adjust clothing after toileting Toileting Assistive Devices: Grab bar or rail Assist level: Touching or steadying assistance (Pt.75%)  Function - Air cabin crew transfer assistive device: Grab bar Assist level to toilet: Touching or steadying assistance (Pt > 75%) Assist level from toilet: Touching or steadying assistance (Pt > 75%)  Function - Chair/bed transfer Chair/bed transfer method: Squat pivot Chair/bed transfer assist level: Touching or steadying assistance (Pt > 75%) Chair/bed transfer assistive device: Armrests Chair/bed transfer details: Manual facilitation for placement, Verbal cues for technique  Function - Locomotion: Wheelchair Will patient use wheelchair at discharge?: Yes Type: Manual Max wheelchair distance: 150' Assist Level: Moderate assistance (Pt 50 - 74%) Assist Level: Moderate assistance (Pt 50 - 74%) Wheel 150 feet activity did not occur: Safety/medical concerns Assist Level: Moderate assistance (Pt 50 - 74%) Turns around,maneuvers to table,bed, and toilet,negotiates 3% grade,maneuvers on rugs and over doorsills: No Function -  Locomotion: Ambulation Assistive device: Walker-rolling Max distance: 58' Assist level: 2 helpers (min A from PT and +2 for w/c follow) Walk 10 feet activity did not occur: Safety/medical concerns Assist level: 2 helpers Walk 50 feet with 2 turns activity did not occur: Safety/medical concerns Assist level: 2 helpers Walk 150 feet activity did not occur: Safety/medical concerns Walk 10 feet on uneven surfaces activity did not occur: Safety/medical concerns  Function - Comprehension Comprehension: Auditory Comprehension assist level: Understands basic 90% of the time/cues < 10% of the time  Function - Expression Expression: Verbal Expression assist level: Expresses basic 50 - 74% of the time/requires cueing 25 - 49% of the time. Needs to repeat parts of sentences.  Function - Social Interaction Social Interaction assist level: Interacts appropriately 90% of the time - Needs monitoring or encouragement for participation or interaction.  Function - Problem Solving Problem solving assist level: Solves basic 50 - 74% of the time/requires cueing 25 - 49% of the time  Function - Memory Memory assist level: Recognizes or recalls 50 - 74% of the time/requires cueing 25 - 49% of the time Patient normally able to recall (first 3 days only): That he or she is in a hospital, Current season  Medical Problem List and Plan: 1.  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 Formulized individual treatment plan based on medical history, underlying problem and comorbidities. 2.  DVT Prophylaxis/Anticoagulation: Pharmaceutical: Lovenox 3. Pain Management:  Tylenol prn 4. Mood: LCSW to follow for evaluation and support.   5. Neuropsych: This patient is not capable of making  decisions onher own behalf. 6. Skin/Wound Care: Routine pressure relief measures. Maintain adequate nutrition and hydration status 7. Fluids/Electrolytes/Nutrition:  Monitor I/O. Check lytes in am.   8. DM type 2:   Home regimen included Invokana 300 mg, Metformin 1000 mg bid, Levemir 40 mg bid and Novolog 20-10-20 ac. Monitor BS ac/hs and  levimir 35U BID, will d/c metformin and monitor bowel pattern, blood sugar 136 9. HTN: Monitor BP every 8 hours.  Check orthostatic BP in am and slowly resume medications as indicated. Continue coreg bid.   HR 77, BP controlled at 156/76 today 10. Diabetic Neuropathy:  Elavil was dc due to concerns of SE--Continue lyrica. and cymbalta 11. Leucocytosis: WBC on upward trend. Will monitor for fevers and other signs of infection.   12. Hypokalemia:  Will supplement for now and recheck in am.  Likely dilutional due to IVF which were d/c today.   13. Klebsiella UTI: resolved 14. OSA: Does not tolerate CPAP 15. Intermittent diarrhea x few months:  May be metformin related, will stop and monitor, soft continent BM yesterday. Continue off metformin 16. COPD with chronic bronchitis/cough:lung exam normal today  Continue nebs prn.   LOS (Days) 4 A FACE TO FACE EVALUATION WAS PERFORMED  Mikolaj Woolstenhulme E 04/09/2015, 8:49 AM

## 2015-04-09 NOTE — Progress Notes (Signed)
Speech Language Pathology Daily Session Note  Patient Details  Name: Casey Harrell MRN: [id number] Date of Birth: [birth year]  Today's Date: 04/09/2015 SLP Individual Time: 0835-0900 SLP Individual Time Calculation (min): 25 min  Short Term Goals: Week 1: SLP Short Term Goal 1 (Week 1): Patient will utilize word-finding strategies with Min A multimodal cues at the sentence level to express basic wants/needs.  SLP Short Term Goal 2 (Week 1): Patient will complete generative naming tasks with Min A multimodal cues.  SLP Short Term Goal 3 (Week 1): Patient will follow mildly complex commands (2-3 step) with extra time and Min A multimodal cues.  SLP Short Term Goal 4 (Week 1): Patient will complete mildly complex and familiar problem solving tasks with Min A multimodal cues.  SLP Short Term Goal 5 (Week 1): Patient will self-monitor and correct errors during functional tasks with Min A question cues.  SLP Short Term Goal 6 (Week 1): Patient will utilize external memory aids to recall new, daily information with Min A question and verbal cues.   Skilled Therapeutic Interventions:  Pt was seen for skilled ST targeting communication goals.  SLP provided skilled education regarding compensatory word finding strategies to maximize functional communication with caregivers.  Pt then utilized the abovementioned compensatory strategies with overall mod assist verbal cues during a structured generative naming task.  Pt was left in bed with bed alarm set and call bell left within reach.  Continue per current plan of care.     Function:  Eating     Cognition Comprehension Comprehension assist level: Understands basic 90% of the time/cues < 10% of the time  Expression   Expression assist level: Expresses basic 50 - 74% of the time/requires cueing 25 - 49% of the time. Needs to repeat parts of sentences.  Social Interaction Social Interaction assist level: Interacts appropriately 90% of the time - Needs  monitoring or encouragement for participation or interaction.  Problem Solving Problem solving assist level: Solves basic 50 - 74% of the time/requires cueing 25 - 49% of the time  Memory Memory assist level: Recognizes or recalls 50 - 74% of the time/requires cueing 25 - 49% of the time    Pain Pain Assessment Pain Assessment: No/denies pain  Therapy/Group: Individual Therapy  Laquasha Groome, Melanee SpryNicole L 04/09/2015, 3:02 PM

## 2015-04-09 NOTE — Plan of Care (Signed)
Problem: RH PAIN MANAGEMENT Goal: RH STG PAIN MANAGED AT OR BELOW PT'S PAIN GOAL <2 on a 0-10 scale  Outcome: Progressing No c/o pain     

## 2015-04-09 NOTE — Progress Notes (Addendum)
Occupational Therapy Session Note  Patient Details  Name: Casey Harrell MRN: [id number] Date of Birth: January 15, 1935  Today's Date: 04/09/2015 OT Individual Time: 0900-1000 OT Individual Time Calculation (min): 60 min   Short Term Goals: Week 1:  OT Short Term Goal 1 (Week 1): Pt will complete stand pivot transfer to toilet with min assist OT Short Term Goal 2 (Week 1): Pt will complete shower transfers with min assist with LRAD OT Short Term Goal 3 (Week 1): Pt will complete LB dressing with mod assist OT Short Term Goal 4 (Week 1): Pt will complete UB dressing with min assist OT Short Term Goal 5 (Week 1): Pt will complete 1 grooming task in standing with min assist  Skilled Therapeutic Interventions/Progress Updates: ADL-retraining at shower level with focus on NMR of RUE during functional tasks, improved attention and awareness, and improved standing balance.   Pt received reclined in bed, HOB elevated with her sister-in-law visiting in her room seated beside her.   Pt receptive for shower level BADL and completed bed mobility and transfers with overall min assist to scoot forward, mod vc for technique, and steadying assist during transfers.   Pt bathed seated on tub bench and used her wash mit after setup to apply.   Pt demo's poor awareness of limitations with right hand (dominant) grip strength weakness and required mod vc to use both hands while bathing for improved thoroughness, efficiency and safety while standing although enthusiastically endorsing principle of incorporating right hand during functional tasks.   Pt dressed seated in w/c with overall mod assist and instructional cues on donning bra overhead, pre-fastened, as alternate method.   Pt left in w/c at end of session with all needs placed within reach and sister-in-laws present.     Therapy Documentation Precautions:  Precautions Precautions: Fall Restrictions Weight Bearing Restrictions: No   Pain: Pain Assessment Pain  Assessment: No/denies pain   See Function Navigator for Current Functional Status.   Therapy/Group: Individual Therapy   Second session: Time: 1300-1330 Time Calculation (min):  30 min  Pain Assessment: No/denies pain  Skilled Therapeutic Interventions: ADL-retraining with focus on self-feeding, NMR of RUE, AE training, improved attention and awareness.   Pt received seated in w/c self-feeding with left hand with food tray on over bed table.  Noting pt's use of right hand during bathing, OT educated pt on use of AE to facilitate continued self-feeding using right UE and universal cuff.   Pt attempted use of cuff as instructed, by assisting with left hand, but lacked sufficient and consistent right elbow strength, attention and awareness to adjust performance appropriately.   OT advised pt to continue use of right hand as stabilizer with plan to introduce use of scoop bowls, dicem, and additional AE to improve management of meals.   Pt completed self-feeding with left hand but requires assist to manage table/tray and open packets.   Pt was observed with symptoms of severe vertigo during this session while bending to reach toward floor followed by looking up.   See FIM for current functional status  Therapy/Group: Individual Therapy  Capucine Tryon 04/09/2015, 10:30 AM

## 2015-04-10 ENCOUNTER — Inpatient Hospital Stay (HOSPITAL_COMMUNITY): Payer: Medicare Other | Admitting: Occupational Therapy

## 2015-04-10 ENCOUNTER — Inpatient Hospital Stay (HOSPITAL_COMMUNITY): Payer: Medicare Other

## 2015-04-10 ENCOUNTER — Inpatient Hospital Stay (HOSPITAL_COMMUNITY): Payer: Medicare Other | Admitting: Physical Therapy

## 2015-04-10 ENCOUNTER — Inpatient Hospital Stay (HOSPITAL_COMMUNITY): Payer: Medicare Other | Admitting: Speech Pathology

## 2015-04-10 LAB — GLUCOSE, CAPILLARY
Glucose-Capillary: 190 mg/dL — ABNORMAL HIGH (ref 65–99)
Glucose-Capillary: 219 mg/dL — ABNORMAL HIGH (ref 65–99)
Glucose-Capillary: 283 mg/dL — ABNORMAL HIGH (ref 65–99)
Glucose-Capillary: 138 mg/dL — ABNORMAL HIGH (ref 65–99)

## 2015-04-10 MED ORDER — FLUTICASONE PROPIONATE 50 MCG/ACT NA SUSP
2.0000 | Freq: Every day | NASAL | Status: DC
  Administered 2015-04-10 – 2015-04-20 (×11): 2 via NASAL
  Filled 2015-04-10 (×2): qty 16

## 2015-04-10 MED ORDER — INSULIN DETEMIR 100 UNIT/ML ~~LOC~~ SOLN
40.0000 [IU] | Freq: Two times a day (BID) | SUBCUTANEOUS | Status: DC
  Administered 2015-04-10 – 2015-04-20 (×20): 40 [IU] via SUBCUTANEOUS
  Filled 2015-04-10 (×22): qty 0.4

## 2015-04-10 NOTE — Progress Notes (Signed)
Physical Therapy Session Note/ Vestibular Assessment  Patient Details  Name: Casey Harrell MRN: [id number] Date of Birth: 10-08-1934  Today's Date: 04/10/2015 PT Individual Time: 0930-1030 PT Individual Time Calculation (min): 60 min   Short Term Goals: Week 1:  PT Short Term Goal 1 (Week 1): Patient will perform bed mobility with min A.  PT Short Term Goal 2 (Week 1): Patient will perform bed <> wheelchair transfers with min A.  PT Short Term Goal 3 (Week 1): Patient will ambulate x 10 ft with assist of one person.  PT Short Term Goal 4 (Week 1): Patient will perform dynamic standing balance with mod A x 3 min.  PT Short Term Goal 5 (Week 1): Patient will negotiate up/down 3 stairs using L rail with mod A.   Skilled Therapeutic Interventions/Progress Updates:    Patient seen with spouse present.  Sit to stand from w/c in room min A and ambulated with RW with R hand splint x 95' min A.  Transfer w/c to mat minguard A and performed vestibular assessment as noted in flowsheet copied below.  Patient demonstrates decreased accomodation with increase time to focus with eyes.  Has some decreased coordination of eyes which seems to cause transient diplopia (?R slower).  Patient also with central vertigo worsening with changes in head position, worse with tipping head down to R and coming up to sit from R sidelying.  Will benefit from continued focus on habituation especially for improving tolerance to coming up to sitting from supine as well as for being able to bend over safely (as spouse reports pt enjoys planting flowers).  Patient tolerated assessment without c/o nausea.  Did not assess BP due to focus on vestibular, but could have symptoms more due to BP changes as well due to h/o orthtstatic hypotension.  Also will initiate eye coordination exercises for progression of gaze fixation/stabilization and use of compensatory technique for symptom management.  Patient assisted back to room in w/c and left  with spouse and call bell in reach. Therapy Documentation Precautions:  Precautions Precautions: Fall Restrictions Weight Bearing Restrictions: No Pain: Pain Assessment Pain Assessment: No/denies pain Pain Score: 0-No pain   Other Treatments:     04/10/15 0001  Symptom Behavior  Type of Dizziness "Funny feeling in head" (swimmy headed)  Frequency of Dizziness intermittent  Duration of Dizziness several minutes  Aggravating Factors Supine to sit;Forward bending  Relieving Factors Lying supine;Closing eyes;Rest  Occulomotor Exam  Occulomotor Alignment Abnormal (R eye higher in orbit)  Spontaneous Absent  Gaze-induced Absent  Head shaking Horizontal Absent (but induces dyscongugate gaze momentarily and disorientation)  Smooth Pursuits Comment (intact, but reports eye fatigue/pain with movements)  Saccades Slow  Vestibulo-Occular Reflex  VOR 1 Head Only (x 1 viewing) difficulty moving head so passive for head movement pt able to maintain target, symptoms 3-4/10  VOR to Slow Head Movement Positive right (with increased dizziness )  Positional Testing  Sidelying Test Sidelying Right;Sidelying Left  Horizontal Canal Testing Horizontal Canal Right;Horizontal Canal Left  Sidelying Right  Sidelying Right Duration 30 sec  Sidelying Right Symptoms No nystagmus  Sidelying Left  Sidelying Left Duration 30 sec  Sidelying Left Symptoms No nystagmus  Horizontal Canal Right  Horizontal Canal Right Duration 30 sec  Horizontal Canal Right Symptoms Normal  Horizontal Canal Left  Horizontal Canal Left Duration 30 sec  Horizontal Canal Left Symptoms Normal  Positional Sensitivities  Sit to Supine 4  Supine to Left Side 1  Supine to  Right Side 1  Supine to Sitting 4  Right Hallpike 4  Up from Right Hallpike 5  Up from Left Hallpike 5  Nose to Right Knee 5  Right Knee to Sitting 4  Nose to Left Knee 4  Left Knee to Sitting 3  Head Turning x 5 1  Head Nodding x 5 2  Rolling  Right 1  Rolling Left 1     See Function Navigator for Current Functional Status.   Therapy/Group: Individual Therapy  Rudi Coco [place], [place] [phone] 04/10/2015  04/10/2015, 1:02 PM

## 2015-04-10 NOTE — Progress Notes (Signed)
Speech Language Pathology Daily Session Note  Patient Details  Name: Casey Harrell MRN: [id number] Date of Birth: 1934-09-27  Today's Date: 04/10/2015 SLP Individual Time: 1100-1200 SLP Individual Time Calculation (min): 60 min  Short Term Goals: Week 1: SLP Short Term Goal 1 (Week 1): Patient will utilize word-finding strategies with Min A multimodal cues at the sentence level to express basic wants/needs.  SLP Short Term Goal 2 (Week 1): Patient will complete generative naming tasks with Min A multimodal cues.  SLP Short Term Goal 3 (Week 1): Patient will follow mildly complex commands (2-3 step) with extra time and Min A multimodal cues.  SLP Short Term Goal 4 (Week 1): Patient will complete mildly complex and familiar problem solving tasks with Min A multimodal cues.  SLP Short Term Goal 5 (Week 1): Patient will self-monitor and correct errors during functional tasks with Min A question cues.  SLP Short Term Goal 6 (Week 1): Patient will utilize external memory aids to recall new, daily information with Min A question and verbal cues.   Skilled Therapeutic Interventions: Skilled St intervention provided with focus on cognitive/self-care goals.   Pt seen in room for ST tx with husband present. Clinician targeted cognition through medication and money management and safety awareness. Pt demonstrated ability to organize mediations into pill organizer, given dosage instructions with 90% accuracy, min A. Pt states that she does not use pill organizer at home. Pt counted given amounts of money with 100% accuracy, no assist. When ask to provide correct amount to 'buy' item, pt presented correct amount of money with 70% accuracy. When able to given exact amount, pt was 100% accurate. Transactions that required change, were more difficult for pt, requiring multiple repetitions and additional time. Pt recalled 5 reasons to use call bell and demonstrated correct use with no assist.     Function:  Eating Eating     Eating Assist Level: Set up assist for   Eating Set Up Assist For: Opening containers       Cognition Comprehension Comprehension assist level: Understands basic 90% of the time/cues < 10% of the time  Expression   Expression assist level: Expresses basic 75 - 89% of the time/requires cueing 10 - 24% of the time. Needs helper to occlude trach/needs to repeat words.  Social Interaction Social Interaction assist level: Interacts appropriately 90% of the time - Needs monitoring or encouragement for participation or interaction.  Problem Solving Problem solving assist level: Solves basic 50 - 74% of the time/requires cueing 25 - 49% of the time  Memory Memory assist level: Recognizes or recalls 50 - 74% of the time/requires cueing 25 - 49% of the time    Pain Pain Assessment Pain Assessment: No/denies pain  Therapy/Group: Individual Therapy  Jaelah Hauth, Kara PacerLeah N 04/10/2015, 3:17 PM

## 2015-04-10 NOTE — Progress Notes (Signed)
Physical Therapy Session Note  Patient Details  Name: Casey Harrell F [place] MRN: [id number] Date of Birth: [birth year]  Today's Date: 04/10/2015 PT Individual Time: 1430-1500 PT Individual Time Calculation (min): 30 min   Short Term Goals: Week 1:  PT Short Term Goal 1 (Week 1): Patient will perform bed mobility with min A.  PT Short Term Goal 2 (Week 1): Patient will perform bed <> wheelchair transfers with min A.  PT Short Term Goal 3 (Week 1): Patient will ambulate x 10 ft with assist of one person.  PT Short Term Goal 4 (Week 1): Patient will perform dynamic standing balance with mod A x 3 min.  PT Short Term Goal 5 (Week 1): Patient will negotiate up/down 3 stairs using L rail with mod A.   Skilled Therapeutic Interventions/Progress Updates:  Pt was seen bedside in the pm. Pt propelled w/c about 100 feet with B LEs, required S and increased time. Pt performed all transfers with rolling walker, hand orthosis and min guard with verbal cues. BP in gym was 136/67. Pt ambulated distances of 70 feet x 2 and 125 feet with rolling walker, hand orthosis, min guard and second person follow with w/c. Pt returned to room and left sitting up in w/c with family at bedside.   Therapy Documentation Precautions:  Precautions Precautions: Fall Restrictions Weight Bearing Restrictions: No General:   Pain: No c/o pain.   See Function Navigator for Current Functional Status.   Therapy/Group: Individual Therapy  Rayford HalstedMitchell, Jonessa Triplett G 04/10/2015, 3:49 PM

## 2015-04-10 NOTE — Progress Notes (Signed)
Casey Harrell is a 80 y.o. female Jan 02, 1935 [id number]  Subjective: No complaints. No new problems. Slept well. Feeling well. Ready for PT  Objective: Vital signs in last 24 hours: Temp:  [97.5 F (36.4 C)-97.8 F (36.6 C)] 97.8 F (36.6 C) (12/03 0550) Pulse Rate:  [71-88] 88 (12/03 0550) Resp:  [18-20] 18 (12/03 0550) BP: (118-155)/(64-86) 155/86 mmHg (12/03 0550) SpO2:  [96 %-97 %] 97 % (12/03 0550) Weight change:  Last BM Date: 04/08/15  Intake/Output from previous day: 12/02 0701 - 12/03 0700 In: 1560 [P.O.:1560] Out: -   Physical Exam General: No apparent distress   Sitting in BS WC, spouse at [facility] Lungs: Normal effort. Lungs clear to auscultation, no crackles or wheezes. Cardiovascular: Regular rate and rhythm, no edema Neurological: mild dysphagia; No new neurological deficits Wounds: N/A     Lab Results: BMET    Component Value Date/Time   NA 138 04/04/2015 0258   K 3.2* 04/04/2015 0258   CL 104 04/04/2015 0258   CO2 25 04/04/2015 0258   GLUCOSE 148* 04/04/2015 0258   BUN 15 04/04/2015 0258   CREATININE 0.65 04/04/2015 0258   CREATININE 0.84 04/19/2012 1700   CALCIUM 9.7 04/04/2015 0258   GFRNONAA >60 04/04/2015 0258   GFRAA >60 04/04/2015 0258   CBC    Component Value Date/Time   WBC 13.1* 04/04/2015 0258   RBC 4.90 04/04/2015 0258   HGB 12.6 04/04/2015 0258   HCT 39.7 04/04/2015 0258   PLT 271 04/04/2015 0258   MCV 81.0 04/04/2015 0258   MCH 25.7* 04/04/2015 0258   MCHC 31.7 04/04/2015 0258   RDW 16.5* 04/04/2015 0258   LYMPHSABS 4.3* 03/25/2015 1231   MONOABS 0.9 03/25/2015 1231   EOSABS 0.6 03/25/2015 1231   BASOSABS 0.1 03/25/2015 1231   CBG's (last 3):    Recent Labs  04/09/15 1646 04/09/15 2211 04/10/15 0642  GLUCAP 218* 188* 190*   LFT's Lab Results  Component Value Date   ALT 31 03/31/2015   AST 34 03/31/2015   ALKPHOS 84 03/31/2015   BILITOT 0.6 03/31/2015    Studies/Results: No results found.  Medications:  I have  reviewed the patient's current medications. Scheduled Medications: . aspirin EC  81 mg Oral Daily  . atorvastatin  10 mg Oral q1800  . camphor-menthol   Topical TID AC  . clopidogrel  75 mg Oral Daily  . DULoxetine  60 mg Oral Daily  . enoxaparin (LOVENOX) injection  40 mg Subcutaneous Q24H  . hydrocerin   Topical BID  . hydrocortisone  25 mg Rectal BID  . insulin aspart  0-5 Units Subcutaneous QHS  . insulin aspart  0-9 Units Subcutaneous TID WC  . insulin aspart  5 Units Subcutaneous TID WC  . insulin detemir  35 Units Subcutaneous BID  . levothyroxine  50 mcg Oral QAC breakfast  . pantoprazole  40 mg Oral Daily  . pregabalin  150 mg Oral Daily  . saccharomyces boulardii  250 mg Oral BID  . triamcinolone lotion   Topical TID   PRN Medications: acetaminophen, alum & mag hydroxide-simeth, bisacodyl, diphenhydrAMINE, guaiFENesin-dextromethorphan, ipratropium-albuterol, meclizine, prochlorperazine **OR** prochlorperazine **OR** prochlorperazine, sodium phosphate  Assessment/Plan: Principal Problem:   Hemiplegia, unspecified affecting right dominant side (HCC) Active Problems:   Hypothyroidism   Type 2 diabetes mellitus, uncontrolled (HCC)   Diabetic neuropathy (HCC)   Dyslipidemia   Essential hypertension   GERD   Sleep apnea- C-pap intol   Depression   Embolic stroke involving posterior  cerebral artery (HCC)  1. 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 Formulized individual treatment plan based on medical history, underlying problem and comorbidities. 2. DVT Prophylaxis/Anticoagulation: Pharmaceutical: Lovenox 3. Pain Management: Tylenol prn 4. Mood: LCSW to follow for evaluation and support.  5. Neuropsych: This patient is not capable of making  decisions onher own behalf. 6. Skin/Wound Care: Routine pressure relief measures. Maintain adequate nutrition and hydration status 7. Fluids/Electrolytes/Nutrition: Monitor I/O. Check lytes in am.  8. DM type 2: Home regimen included Invokana 300 mg, Metformin 1000 mg bid, Levemir 40 mg bid and Novolog 20-10-20 ac. Monitor BS ac/hs; increase levimir to home regimen 40UBID; remains off metformin d/t chronic Diarrhea symptoms 9. HTN: Monitor BP every 8 hours. Check orthostatic BP qam and slowly resume medications as indicated. Continue coreg bid.  10. Diabetic Neuropathy: Elavil was dc'd due to concerns of SE--Continue lyrica and cymbalta 11. Leucocytosis: reviewed WBC upward trend. Will continue to monitor for fevers and other signs of infection.  12. Hypokalemia: s/p supplement; reck in am.   13. Klebsiella UTI: resolved 14. OSA: Does not tolerate CPAP 15. Intermittent diarrhea x few months: ?if metformin related so stopped same; monitor, soft continent BM yesterday. Continue off metformin 16. COPD with chronic bronchitis/cough:lung exam normal today Continue nebs prn. 17. Hypothyroid -continue supplement   Length of stay, days: 5  Casey Harrell A. Felicity Coyer, MD 04/10/2015, 10:34 AM

## 2015-04-10 NOTE — Progress Notes (Signed)
Occupational Therapy Session Note  Patient Details  Name: Casey Harrell Sponsel MRN: [id number] Date of Birth: 11/03/1934  Today's Date: 04/10/2015 OT Individual Time:  -    0800-0900  (60 min)      Short Term Goals: Week 1:  OT Short Term Goal 1 (Week 1): Pt will complete stand pivot transfer to toilet with min assist OT Short Term Goal 2 (Week 1): Pt will complete shower transfers with min assist with LRAD OT Short Term Goal 3 (Week 1): Pt will complete LB dressing with mod assist OT Short Term Goal 4 (Week 1): Pt will complete UB dressing with min assist OT Short Term Goal 5 (Week 1): Pt will complete 1 grooming task in standing with min assist Week 2:     Skilled Therapeutic Interventions/Progress Updates:    Focus of treatment was bed mobility, transfers,  Neuro-muscular reeducation, sitting balance, standing balance, attention, therapeutic activities, sustained attention, postural control.  Pt was SBA with supine to EOB.  Ambulated with RW to toilet with 3n1 over it.  Pt urinated.  Ambulated with mod cues for balance on forefoot.  Transferred to shower.  Did sit to stand and standing balance with min to SBA while in shower.  Dressed standing by toilet with min assist for standing balance.  Pt ambulated and finished grooming at sink.  Left pt in wc with  call bell,phone within reach. Husband, Cliff in room.         Therapy Documentation Precautions:  Precautions Precautions: Fall Restrictions Weight Bearing Restrictions: No       Pain:  3/10 headache           See Function Navigator for Current Functional Status.   Therapy/Group: Individual Therapy  Humberto Sealsdwards, Ozell Ferrera J 04/10/2015, 8:01 AM

## 2015-04-10 NOTE — Progress Notes (Signed)
Congested cough, PRN robitussin given at 2233 with relief.Casey MartinezMurray, Casey Harrell

## 2015-04-11 ENCOUNTER — Inpatient Hospital Stay (HOSPITAL_COMMUNITY): Payer: Medicare Other | Admitting: Occupational Therapy

## 2015-04-11 ENCOUNTER — Inpatient Hospital Stay (HOSPITAL_COMMUNITY): Payer: Medicare Other | Admitting: Physical Therapy

## 2015-04-11 LAB — BASIC METABOLIC PANEL
Anion gap: 9 (ref 5–15)
BUN: 18 mg/dL (ref 6–20)
CO2: 25 mmol/L (ref 22–32)
Calcium: 9.9 mg/dL (ref 8.9–10.3)
Chloride: 102 mmol/L (ref 101–111)
Creatinine, Ser: 0.81 mg/dL (ref 0.44–1.00)
GFR calc Af Amer: >60 mL/min (ref >60)
GFR calc non Af Amer: >60 mL/min (ref >60)
Glucose, Bld: 176 mg/dL — ABNORMAL HIGH (ref 65–99)
Potassium: 4.1 mmol/L (ref 3.5–5.1)
Sodium: 136 mmol/L (ref 135–145)

## 2015-04-11 LAB — GLUCOSE, CAPILLARY
Glucose-Capillary: 179 mg/dL — ABNORMAL HIGH (ref 65–99)
Glucose-Capillary: 160 mg/dL — ABNORMAL HIGH (ref 65–99)
Glucose-Capillary: 289 mg/dL — ABNORMAL HIGH (ref 65–99)
Glucose-Capillary: 306 mg/dL — ABNORMAL HIGH (ref 65–99)
Glucose-Capillary: 231 mg/dL — ABNORMAL HIGH (ref 65–99)

## 2015-04-11 MED ORDER — IPRATROPIUM-ALBUTEROL 0.5-2.5 (3) MG/3ML IN SOLN
3.0000 mL | RESPIRATORY_TRACT | Status: DC | PRN
  Administered 2015-04-11 – 2015-04-12 (×5): 3 mL via RESPIRATORY_TRACT
  Filled 2015-04-11 (×5): qty 3

## 2015-04-11 NOTE — Progress Notes (Signed)
Occupational Therapy Session Note  Patient Details  Name: Casey Harrell MRN: [id number] Date of Birth: Dec 01, 1934  Today's Date: 04/11/2015 OT Individual Time:  -    1345-1430  (45 min)       Short Term Goals: Week 1:  OT Short Term Goal 1 (Week 1): Pt will complete stand pivot transfer to toilet with min assist OT Short Term Goal 2 (Week 1): Pt will complete shower transfers with min assist with LRAD OT Short Term Goal 3 (Week 1): Pt will complete LB dressing with mod assist OT Short Term Goal 4 (Week 1): Pt will complete UB dressing with min assist OT Short Term Goal 5 (Week 1): Pt will complete 1 grooming task in standing with min assist Week 2:     Skilled Therapeutic Interventions/Progress Updates:    Engaged in skilled OT intervention for RUE AROM, NMRE, functional tasks for RIUE.  Pt. Lying in bed but agreed to OT session.  Transferred to wc and propelled to sink.  Pt uses RUE with mod assist gripping toothbrush, combing hair.  Engaged in card game for increased NMRE of RUE.  Pt demonstrated fair lateral grasp of cards.  Returned to room at end of session with all needs in reach.    Therapy Documentation Precautions:  Precautions Precautions: Fall Restrictions Weight Bearing Restrictions: No      Pain:  6/10  back     See Function Navigator for Current Functional Status.   Therapy/Group: Individual Therapy  Humberto Sealsdwards, Leoncio Hansen J 04/11/2015, 6:55 PM

## 2015-04-11 NOTE — Progress Notes (Signed)
Casey Harrell is a 80 y.o. female 03/02/1935 [id number]  Subjective: Feels like she is coming down with head cold - head congestion above eyes, tired - chest cough yesterday improved with Alb neb  Objective: Vital signs in last 24 hours: Temp:  [98.1 F (36.7 C)-99.3 F (37.4 C)] 99.3 F (37.4 C) (12/04 0442) Pulse Rate:  [82-100] 100 (12/04 0442) Resp:  [18-22] 22 (12/04 0442) BP: (115-160)/(80-96) 160/80 mmHg (12/04 0442) SpO2:  [93 %-97 %] 95 % (12/04 0842) Weight change:  Last BM Date: 04/09/15  Intake/Output from previous day: 12/03 0701 - 12/04 0700 In: 1440 [P.O.:1440] Out: -   Physical Exam General: No apparent distress   Lying supine, spouse at [facility] Lungs: Normal effort. Lungs clear to auscultation, no crackles or wheezes. Cardiovascular: Regular rate and rhythm, no edema Neurological: mild dysphagia; No new neurological deficits  Lab Results: BMET    Component Value Date/Time   NA 136 04/11/2015 0628   K 4.1 04/11/2015 0628   CL 102 04/11/2015 0628   CO2 25 04/11/2015 0628   GLUCOSE 176* 04/11/2015 0628   BUN 18 04/11/2015 0628   CREATININE 0.81 04/11/2015 0628   CREATININE 0.84 04/19/2012 1700   CALCIUM 9.9 04/11/2015 0628   GFRNONAA >60 04/11/2015 0628   GFRAA >60 04/11/2015 0628   CBC    Component Value Date/Time   WBC 13.1* 04/04/2015 0258   RBC 4.90 04/04/2015 0258   HGB 12.6 04/04/2015 0258   HCT 39.7 04/04/2015 0258   PLT 271 04/04/2015 0258   MCV 81.0 04/04/2015 0258   MCH 25.7* 04/04/2015 0258   MCHC 31.7 04/04/2015 0258   RDW 16.5* 04/04/2015 0258   LYMPHSABS 4.3* 03/25/2015 1231   MONOABS 0.9 03/25/2015 1231   EOSABS 0.6 03/25/2015 1231   BASOSABS 0.1 03/25/2015 1231   CBG's (last 3):    Recent Labs  04/10/15 2110 04/11/15 0420 04/11/15 0647  GLUCAP 138* 179* 160*   LFT's Lab Results  Component Value Date   ALT 31 03/31/2015   AST 34 03/31/2015   ALKPHOS 84 03/31/2015   BILITOT 0.6 03/31/2015    Studies/Results: No  results found.  Medications:  I have reviewed the patient's current medications. Scheduled Medications: . aspirin EC  81 mg Oral Daily  . atorvastatin  10 mg Oral q1800  . camphor-menthol   Topical TID AC  . clopidogrel  75 mg Oral Daily  . DULoxetine  60 mg Oral Daily  . enoxaparin (LOVENOX) injection  40 mg Subcutaneous Q24H  . fluticasone  2 spray Each Nare Daily  . hydrocerin   Topical BID  . hydrocortisone  25 mg Rectal BID  . insulin aspart  0-5 Units Subcutaneous QHS  . insulin aspart  0-9 Units Subcutaneous TID WC  . insulin aspart  5 Units Subcutaneous TID WC  . insulin detemir  40 Units Subcutaneous BID  . levothyroxine  50 mcg Oral QAC breakfast  . pantoprazole  40 mg Oral Daily  . pregabalin  150 mg Oral Daily  . saccharomyces boulardii  250 mg Oral BID  . triamcinolone lotion   Topical TID   PRN Medications: acetaminophen, alum & mag hydroxide-simeth, bisacodyl, diphenhydrAMINE, guaiFENesin-dextromethorphan, ipratropium-albuterol, meclizine, prochlorperazine **OR** prochlorperazine **OR** prochlorperazine, sodium phosphate  Assessment/Plan: Principal Problem:   Hemiplegia, unspecified affecting right dominant side (HCC) Active Problems:   Hypothyroidism   Type 2 diabetes mellitus, uncontrolled ([place])   Diabetic neuropathy ([place])   Dyslipidemia   Essential hypertension   GERD  Casey Harrell is a 79 y.o. female 10/06/1934 [id number]  Subjective: Feels like she is coming down with head cold - head congestion above eyes, tired - chest cough yesterday improved with Alb neb  Objective: Vital signs in last 24 hours: Temp:  [98.1 F (36.7 C)-99.3 F (37.4 C)] 99.3 F (37.4 C) (12/04 0442) Pulse Rate:  [82-100] 100 (12/04 0442) Resp:  [18-22] 22 (12/04 0442) BP: (115-160)/(80-96) 160/80 mmHg (12/04 0442) SpO2:  [93 %-97 %] 95 % (12/04 0842) Weight change:  Last BM Date: 04/09/15  Intake/Output from previous day: 12/03 0701 - 12/04 0700 In: 1440 [P.O.:1440] Out: -   Physical Exam General: No apparent distress   Lying supine, spouse at [facility] Lungs: Normal effort. Lungs clear to auscultation, no crackles or wheezes. Cardiovascular: Regular rate and rhythm, no edema Neurological: mild dysphagia; No new neurological deficits  Lab Results: BMET    Component Value Date/Time   NA 136 04/11/2015 0628   K 4.1 04/11/2015 0628   CL 102 04/11/2015 0628   CO2 25 04/11/2015 0628   GLUCOSE 176* 04/11/2015 0628   BUN 18 04/11/2015 0628   CREATININE 0.81 04/11/2015 0628   CREATININE 0.84 04/19/2012 1700   CALCIUM 9.9 04/11/2015 0628   GFRNONAA >60 04/11/2015 0628   GFRAA >60 04/11/2015 0628   CBC    Component Value Date/Time   WBC 13.1* 04/04/2015 0258   RBC 4.90 04/04/2015 0258   HGB 12.6 04/04/2015 0258   HCT 39.7 04/04/2015 0258   PLT 271 04/04/2015 0258   MCV 81.0 04/04/2015 0258   MCH 25.7* 04/04/2015 0258   MCHC 31.7 04/04/2015 0258   RDW 16.5* 04/04/2015 0258   LYMPHSABS 4.3* 03/25/2015 1231   MONOABS 0.9 03/25/2015 1231   EOSABS 0.6 03/25/2015 1231   BASOSABS 0.1 03/25/2015 1231   CBG's (last 3):    Recent Labs  04/10/15 2110 04/11/15 0420 04/11/15 0647  GLUCAP 138* 179* 160*   LFT's Lab Results  Component Value Date   ALT 31 03/31/2015   AST 34 03/31/2015   ALKPHOS 84 03/31/2015   BILITOT 0.6 03/31/2015    Studies/Results: No  results found.  Medications:  I have reviewed the patient's current medications. Scheduled Medications: . aspirin EC  81 mg Oral Daily  . atorvastatin  10 mg Oral q1800  . camphor-menthol   Topical TID AC  . clopidogrel  75 mg Oral Daily  . DULoxetine  60 mg Oral Daily  . enoxaparin (LOVENOX) injection  40 mg Subcutaneous Q24H  . fluticasone  2 spray Each Nare Daily  . hydrocerin   Topical BID  . hydrocortisone  25 mg Rectal BID  . insulin aspart  0-5 Units Subcutaneous QHS  . insulin aspart  0-9 Units Subcutaneous TID WC  . insulin aspart  5 Units Subcutaneous TID WC  . insulin detemir  40 Units Subcutaneous BID  . levothyroxine  50 mcg Oral QAC breakfast  . pantoprazole  40 mg Oral Daily  . pregabalin  150 mg Oral Daily  . saccharomyces boulardii  250 mg Oral BID  . triamcinolone lotion   Topical TID   PRN Medications: acetaminophen, alum & mag hydroxide-simeth, bisacodyl, diphenhydrAMINE, guaiFENesin-dextromethorphan, ipratropium-albuterol, meclizine, prochlorperazine **OR** prochlorperazine **OR** prochlorperazine, sodium phosphate  Assessment/Plan: Principal Problem:   Hemiplegia, unspecified affecting right dominant side (HCC) Active Problems:   Hypothyroidism   Type 2 diabetes mellitus, uncontrolled (HCC)   Diabetic neuropathy (HCC)   Dyslipidemia   Essential hypertension   GERD 

## 2015-04-11 NOTE — Progress Notes (Signed)
Congested, tight cough with audible wheeze. Nasal congestion noted. O2 sat 94% RA. Paged Dr. Felicity CoyerLeschber and orders received. Albuterol neb treatment given at 2104 and 0156. PRN robitussin given at 0156.  At 0415, patient diaphoretic, with audible wheeze-Temp.99.3, resp.22, O2 sat 94%, 160/80, HR-100 and CBG=179. Patient without complaint of. Casey Harrell, [name] A

## 2015-04-12 ENCOUNTER — Inpatient Hospital Stay (HOSPITAL_COMMUNITY): Payer: Medicare Other

## 2015-04-12 ENCOUNTER — Inpatient Hospital Stay (HOSPITAL_COMMUNITY): Payer: Medicare Other | Admitting: Speech Pathology

## 2015-04-12 LAB — GLUCOSE, CAPILLARY
Glucose-Capillary: 214 mg/dL — ABNORMAL HIGH (ref 65–99)
Glucose-Capillary: 189 mg/dL — ABNORMAL HIGH (ref 65–99)
Glucose-Capillary: 208 mg/dL — ABNORMAL HIGH (ref 65–99)
Glucose-Capillary: 183 mg/dL — ABNORMAL HIGH (ref 65–99)

## 2015-04-12 LAB — CREATININE, SERUM
Creatinine, Ser: 0.82 mg/dL (ref 0.44–1.00)
GFR calc Af Amer: >60 mL/min (ref >60)
GFR calc non Af Amer: >60 mL/min (ref >60)

## 2015-04-12 MED ORDER — IPRATROPIUM-ALBUTEROL 0.5-2.5 (3) MG/3ML IN SOLN
3.0000 mL | RESPIRATORY_TRACT | Status: DC | PRN
  Administered 2015-04-12 – 2015-04-18 (×6): 3 mL via RESPIRATORY_TRACT
  Filled 2015-04-12 (×6): qty 3

## 2015-04-12 MED ORDER — IPRATROPIUM-ALBUTEROL 0.5-2.5 (3) MG/3ML IN SOLN
3.0000 mL | Freq: Two times a day (BID) | RESPIRATORY_TRACT | Status: DC
  Administered 2015-04-12 – 2015-04-13 (×2): 3 mL via RESPIRATORY_TRACT
  Filled 2015-04-12 (×2): qty 3

## 2015-04-12 MED ORDER — INSULIN ASPART 100 UNIT/ML ~~LOC~~ SOLN
8.0000 [IU] | Freq: Three times a day (TID) | SUBCUTANEOUS | Status: DC
  Administered 2015-04-12 – 2015-04-20 (×24): 8 [IU] via SUBCUTANEOUS

## 2015-04-12 MED ORDER — BENZONATATE 100 MG PO CAPS
100.0000 mg | ORAL_CAPSULE | Freq: Three times a day (TID) | ORAL | Status: DC | PRN
  Administered 2015-04-12 – 2015-04-20 (×14): 100 mg via ORAL
  Filled 2015-04-12 (×14): qty 1

## 2015-04-12 NOTE — Progress Notes (Signed)
Occupational Therapy Session Note  Patient Details  Name: Casey Harrell MRN: [id number] Date of Birth: 1934/05/10  Today's Date: 04/12/2015 OT Individual Time: [id number] OT Individual Time Calculation (min): 30 min    Short Term Goals: Week 1:  OT Short Term Goal 1 (Week 1): Pt will complete stand pivot transfer to toilet with min assist OT Short Term Goal 2 (Week 1): Pt will complete shower transfers with min assist with LRAD OT Short Term Goal 3 (Week 1): Pt will complete LB dressing with mod assist OT Short Term Goal 4 (Week 1): Pt will complete UB dressing with min assist OT Short Term Goal 5 (Week 1): Pt will complete 1 grooming task in standing with min assist  Skilled Therapeutic Interventions/Progress Updates: ADL-retraining at sink with focus on improved upper body baqthing and dressing skills, NMR of RUE.   Pt received seated in her w/c, coughing forcefully and unproductive.   Pt reports discomfort at right upper ribs d/t prolonged coughing.   RN aware and requested x-ray through provider.    Pt was able to initiate upper body bathing after setup to provide supplies and extra time d/t intermitted coughing.  Husband present during session.   Pt required hand-over-hand assist to manage right UE through long sleeve shirt and min vc to sequence expeditiously d/t impending escort to x-ray.   Pt assisted back to bed with only steadying assist to transfer as escort arrived in room.  Therapy Documentation Precautions:  Precautions Precautions: Fall Restrictions Weight Bearing Restrictions: No   General: General OT Amount of Missed Time: 30 Minutes   Vital Signs: Therapy Vitals Pulse Rate: 92 BP: (!) 149/95 mmHg (after ambulation) Patient Position (if appropriate): Sitting Oxygen Therapy SpO2: 97 % O2 Device: Not Delivered   Pain: Pain Assessment Pain Assessment: No/denies pain  See Function Navigator for Current Functional Status.  Therapy/Group: Individual  Therapy  Dortha Neighbors 04/12/2015, 12:03 PM

## 2015-04-12 NOTE — Progress Notes (Signed)
Physical Therapy Session Note  Patient Details  Name: Casey Harrell MRN: [id number] Date of Birth: 01-Mar-1935  Today's Date: 04/12/2015 PT Individual Time: 1000-1030 PT Individual Time Calculation (min): 30 min   Short Term Goals: Week 1:  PT Short Term Goal 1 (Week 1): Patient will perform bed mobility with min A.  PT Short Term Goal 2 (Week 1): Patient will perform bed <> wheelchair transfers with min A.  PT Short Term Goal 3 (Week 1): Patient will ambulate x 10 ft with assist of one person.  PT Short Term Goal 4 (Week 1): Patient will perform dynamic standing balance with mod A x 3 min.  PT Short Term Goal 5 (Week 1): Patient will negotiate up/down 3 stairs using L rail with mod A.   Skilled Therapeutic Interventions/Progress Updates:    Denies pain. Session focused on functional bed mobility with cues for keeping eyes on a target, basic transfers, neuro re-ed for dynamic standing balance, postural control, and coordination, and gait training with RW (and R hand orthosis). Pt overall steady assist for transfers with RW with cues for hand placement and to remove RUE from orthosis before sitting down. During balance activity, kicked ball back and forth alternating BLE and then stopping ball and kicking it initially with UE support and progressed to no UE support. Pt overall min assist except once no UE support and attempted ball stop LOB with controlled descent onto the mat.   Therapy Documentation Precautions:  Precautions Precautions: Fall Restrictions Weight Bearing Restrictions: No    See Function Navigator for Current Functional Status.   Therapy/Group: Individual Therapy and Co-Treatment with TR  Karolee StampsGray, Liyla Radliff Darrol PokeBrescia  Kim Oki B. Levon Penning, PT, DPT  04/12/2015, 12:04 PM

## 2015-04-12 NOTE — Progress Notes (Addendum)
Speech Language Pathology Daily Session Note  Patient Details  Name: Casey Harrell MRN: [id number] Date of Birth: March 07, 1935  Today's Date: 04/12/2015 SLP Individual Time: 1300-1357 SLP Individual Time Calculation (min): 57 min  Short Term Goals: Week 1: SLP Short Term Goal 1 (Week 1): Patient will utilize word-finding strategies with Min A multimodal cues at the sentence level to express basic wants/needs.  SLP Short Term Goal 2 (Week 1): Patient will complete generative naming tasks with Min A multimodal cues.  SLP Short Term Goal 3 (Week 1): Patient will follow mildly complex commands (2-3 step) with extra time and Min A multimodal cues.  SLP Short Term Goal 4 (Week 1): Patient will complete mildly complex and familiar problem solving tasks with Min A multimodal cues.  SLP Short Term Goal 5 (Week 1): Patient will self-monitor and correct errors during functional tasks with Min A question cues.  SLP Short Term Goal 6 (Week 1): Patient will utilize external memory aids to recall new, daily information with Min A question and verbal cues.   Skilled Therapeutic Interventions: Pt was seen for skilled ST targeting cognitive-linguistic goals.  Pt was agreeable to participating in therapies at bedside despite not feeling well and having frequent, congested cough.  SLP facilitated the session with a 4 step sequencing task targeting use of word finding strategies and mildly complex problem solving.  Pt was 100% accurate for sequencing picture cards with min assist verbal cues for awareness of errors.  Pt was also able to verbally describe scenes in pictures with extra time and supervision cues.  Pt was supervision level assist for functional word finding during loosely structured conversations with SLP regarding personal hobbies and interests.  Pt requested to use the restroom appropriately and utilized the call bell with set up assist to convey toileting needs to nursing at the end of today's therapy  session.   Pt was left in bed with husband and nurse tech at bedside.  Continue per current plan of care.       Function:  Eating Eating                 Cognition Comprehension Comprehension assist level: Understands basic 90% of the time/cues < 10% of the time  Expression   Expression assist level: Expresses basic 75 - 89% of the time/requires cueing 10 - 24% of the time. Needs helper to occlude trach/needs to repeat words.  Social Interaction Social Interaction assist level: Interacts appropriately 90% of the time - Needs monitoring or encouragement for participation or interaction.  Problem Solving Problem solving assist level: Solves basic 50 - 74% of the time/requires cueing 25 - 49% of the time  Memory Memory assist level: Recognizes or recalls 50 - 74% of the time/requires cueing 25 - 49% of the time    Pain Pain Assessment Pain Assessment: No/denies pain  Therapy/Group: Individual Therapy  Doshia Dalia, Melanee SpryNicole L 04/12/2015, 4:03 PM

## 2015-04-12 NOTE — Progress Notes (Signed)
 Subjective/Complaints: Patient visiting with sister-in-law at bedside.  Review of systems no breathing problems no bowel or bladder issues, no joint or muscle pains.  Objective: Vital Signs: Blood pressure 149/83, pulse 87, temperature 98.3 F (36.8 C), temperature source Oral, resp. rate 19, height 5' 1" (1.549 m), weight 67.5 kg (148 lb 13 oz), SpO2 100 %. No results found. Results for orders placed or performed during the hospital encounter of 04/05/15 (from the past 72 hour(s))  Glucose, capillary     Status: Abnormal   Collection Time: 04/09/15 11:57 AM  Result Value Ref Range   Glucose-Capillary 197 (H) 65 - 99 mg/dL   Comment 1 Notify RN   Glucose, capillary     Status: Abnormal   Collection Time: 04/09/15  4:46 PM  Result Value Ref Range   Glucose-Capillary 218 (H) 65 - 99 mg/dL   Comment 1 Notify RN   Glucose, capillary     Status: Abnormal   Collection Time: 04/09/15 10:11 PM  Result Value Ref Range   Glucose-Capillary 188 (H) 65 - 99 mg/dL   Comment 1 Notify RN   Glucose, capillary     Status: Abnormal   Collection Time: 04/10/15  6:42 AM  Result Value Ref Range   Glucose-Capillary 190 (H) 65 - 99 mg/dL   Comment 1 Notify RN   Glucose, capillary     Status: Abnormal   Collection Time: 04/10/15 11:20 AM  Result Value Ref Range   Glucose-Capillary 219 (H) 65 - 99 mg/dL   Comment 1 Notify RN   Glucose, capillary     Status: Abnormal   Collection Time: 04/10/15  4:31 PM  Result Value Ref Range   Glucose-Capillary 283 (H) 65 - 99 mg/dL   Comment 1 Notify RN   Glucose, capillary     Status: Abnormal   Collection Time: 04/10/15  9:10 PM  Result Value Ref Range   Glucose-Capillary 138 (H) 65 - 99 mg/dL  Glucose, capillary     Status: Abnormal   Collection Time: 04/11/15  4:20 AM  Result Value Ref Range   Glucose-Capillary 179 (H) 65 - 99 mg/dL  Basic metabolic panel     Status: Abnormal   Collection Time: 04/11/15  6:28 AM  Result Value Ref Range   Sodium  136 135 - 145 mmol/L   Potassium 4.1 3.5 - 5.1 mmol/L   Chloride 102 101 - 111 mmol/L   CO2 25 22 - 32 mmol/L   Glucose, Bld 176 (H) 65 - 99 mg/dL   BUN 18 6 - 20 mg/dL   Creatinine, Ser 0.81 0.44 - 1.00 mg/dL   Calcium 9.9 8.9 - 10.3 mg/dL   GFR calc non Af Amer >60 >60 mL/min   GFR calc Af Amer >60 >60 mL/min    Comment: (NOTE) The eGFR has been calculated using the CKD EPI equation. This calculation has not been validated in all clinical situations. eGFR's persistently <60 mL/min signify possible Chronic Kidney Disease.    Anion gap 9 5 - 15  Glucose, capillary     Status: Abnormal   Collection Time: 04/11/15  6:47 AM  Result Value Ref Range   Glucose-Capillary 160 (H) 65 - 99 mg/dL  Glucose, capillary     Status: Abnormal   Collection Time: 04/11/15 11:19 AM  Result Value Ref Range   Glucose-Capillary 289 (H) 65 - 99 mg/dL   Comment 1 Notify RN   Glucose, capillary     Status: Abnormal   Collection   Time: 04/11/15  4:24 PM  Result Value Ref Range   Glucose-Capillary 306 (H) 65 - 99 mg/dL   Comment 1 Notify RN   Glucose, capillary     Status: Abnormal   Collection Time: 04/11/15  8:33 PM  Result Value Ref Range   Glucose-Capillary 231 (H) 65 - 99 mg/dL  Creatinine, serum     Status: None   Collection Time: 04/12/15  6:44 AM  Result Value Ref Range   Creatinine, Ser 0.82 0.44 - 1.00 mg/dL   GFR calc non Af Amer >60 >60 mL/min   GFR calc Af Amer >60 >60 mL/min    Comment: (NOTE) The eGFR has been calculated using the CKD EPI equation. This calculation has not been validated in all clinical situations. eGFR's persistently <60 mL/min signify possible Chronic Kidney Disease.   Glucose, capillary     Status: Abnormal   Collection Time: 04/12/15  7:00 AM  Result Value Ref Range   Glucose-Capillary 214 (H) 65 - 99 mg/dL     HEENT: normal Cardio: RRR and no murmur Resp: CTA B/L and unlabored GI: BS positive and NT, ND Extremity:  No Edema Skin:   Intact Neuro:  Alert/Oriented, Abnormal Sensory reduced LT, proprio RUE, Abnormal Motor 2- R delt bi tri grip, 4/5 RLE, 5/5 on left side, Abnormal FMC Ataxic/ dec FMC, Inattention and Other R HH Musc/Skel:  Other no pain with UE or LE ROM Gen NAD   Assessment/Plan: 1. Functional deficits secondary to Right hemiparesis, Right homonymous hemianopsia, cognitive deficits related to CVA which require 3+ hours per day of interdisciplinary therapy in a comprehensive inpatient rehab setting. Physiatrist is providing close team supervision and 24 hour management of active medical problems listed below. Physiatrist and rehab team continue to assess barriers to discharge/monitor patient progress toward functional and medical goals. FIM: Function - Bathing Position: Shower Body parts bathed by patient: Right arm, Left arm, Chest, Abdomen, Front perineal area, Buttocks, Right upper leg, Left upper leg, Right lower leg, Left lower leg Body parts bathed by helper: Back Assist Level: Touching or steadying assistance(Pt > 75%)  Function- Upper Body Dressing/Undressing What is the patient wearing?: Pull over shirt/dress, Bra Bra - Perfomed by patient: Thread/unthread right bra strap, Thread/unthread left bra strap Bra - Perfomed by helper: Hook/unhook bra (pull down sports bra) Pull over shirt/dress - Perfomed by patient: Thread/unthread right sleeve, Thread/unthread left sleeve, Put head through opening, Pull shirt over trunk Pull over shirt/dress - Perfomed by helper: Pull shirt over trunk Assist Level: Touching or steadying assistance(Pt > 75%) Function - Lower Body Dressing/Undressing What is the patient wearing?: Socks, Shoes, Pants, Underwear Position: Standing at sink Underwear - Performed by patient: Thread/unthread left underwear leg, Thread/unthread right underwear leg, Pull underwear up/down Underwear - Performed by helper: Pull underwear up/down Pants- Performed by patient: Thread/unthread right pants leg,  Thread/unthread left pants leg Pants- Performed by helper: Pull pants up/down Non-skid slipper socks- Performed by helper: Don/doff right sock, Don/doff left sock Socks - Performed by patient: Don/doff right sock Socks - Performed by helper: Don/doff right sock, Don/doff left sock Shoes - Performed by patient: Don/doff right shoe, Don/doff left shoe Assist for footwear: Dependant Assist for lower body dressing: Touching or steadying assistance (Pt > 75%)  Function - Toileting Toileting steps completed by patient: Performs perineal hygiene Toileting steps completed by helper: Adjust clothing prior to toileting, Adjust clothing after toileting Toileting Assistive Devices: Grab bar or rail Assist level: Touching or steadying assistance (Pt.75%)    Function - Toilet Transfers Toilet transfer assistive device: Grab bar, Elevated toilet seat/BSC over toilet Assist level to toilet: Touching or steadying assistance (Pt > 75%) Assist level from toilet: Touching or steadying assistance (Pt > 75%)  Function - Chair/bed transfer Chair/bed transfer method: Stand pivot Chair/bed transfer assist level: Touching or steadying assistance (Pt > 75%) Chair/bed transfer assistive device: Armrests, Walker, Orthosis Chair/bed transfer details: Verbal cues for precautions/safety, Verbal cues for safe use of DME/AE  Function - Locomotion: Wheelchair Will patient use wheelchair at discharge?: Yes Type: Manual Max wheelchair distance: 100 Assist Level: Supervision or verbal cues Assist Level: Supervision or verbal cues Wheel 150 feet activity did not occur: Safety/medical concerns Assist Level: Dependent (Pt equals 0%) Turns around,maneuvers to table,bed, and toilet,negotiates 3% grade,maneuvers on rugs and over doorsills: No Function - Locomotion: Ambulation Assistive device: Walker-rolling Max distance: 125 Assist level: 2 helpers Walk 10 feet activity did not occur: Safety/medical concerns Assist  level: 2 helpers Walk 50 feet with 2 turns activity did not occur: Safety/medical concerns Assist level: 2 helpers Walk 150 feet activity did not occur: Safety/medical concerns Walk 10 feet on uneven surfaces activity did not occur: Safety/medical concerns  Function - Comprehension Comprehension: Auditory Comprehension assist level: Understands basic 90% of the time/cues < 10% of the time  Function - Expression Expression: Verbal Expression assist level: Expresses basic 75 - 89% of the time/requires cueing 10 - 24% of the time. Needs helper to occlude trach/needs to repeat words.  Function - Social Interaction Social Interaction assist level: Interacts appropriately 90% of the time - Needs monitoring or encouragement for participation or interaction.  Function - Problem Solving Problem solving assist level: Solves basic 50 - 74% of the time/requires cueing 25 - 49% of the time  Function - Memory Memory assist level: Recognizes or recalls 50 - 74% of the time/requires cueing 25 - 49% of the time Patient normally able to recall (first 3 days only): That he or she is in a hospital, Current season  Medical Problem List and Plan: 1.  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 Formulized individual treatment plan based on medical history, underlying problem and comorbidities. 2.  DVT Prophylaxis/Anticoagulation: Pharmaceutical: Lovenox 3. Pain Management:  Tylenol prn 4. Mood: LCSW to follow for evaluation and support.   5. Neuropsych: This patient is not capable of making decisions onher own behalf. 6. Skin/Wound Care: Routine pressure relief measures. Maintain adequate nutrition and hydration status 7. Fluids/Electrolytes/Nutrition:  Monitor I/O. Check lytes in am.    8. DM type 2:   Home regimen included Invokana 300 mg, Metformin 1000 mg bid, Levemir 40 mg bid and Novolog 20-10-20 ac. Monitor BS ac/hs and  levimir 40U BID, will d/c metformin and monitor bowel pattern, blood sugar 214, increase [place] to 8 units with meals 9. HTN: Monitor BP every 8 hours.  Check orthostatic BP in am and slowly resume medications as indicated. Continue coreg bid.   HR 77, BP controlled at 156/76 today 10. Diabetic Neuropathy:  Elavil was dc due to concerns of SE--Continue lyrica. and cymbalta, no complaints of neuropathic pain today 11. Leucocytosis: WBC on upward trend. Will monitor for fevers and other signs of infection.   12. Hypokalemia:  Will supplement for now and recheck in am.  Likely dilutional due to IVF which were d/c today.   13.   Klebsiella UTI: resolved 14. OSA: Does not tolerate CPAP 15. Intermittent diarrhea x few months:  May be metformin related, will stop and monitor, soft continent BM yesterday. Continue off metformin 16. COPD with chronic bronchitis/cough:lung exam normal today  Continue nebs [employer].no of cough will recheck chest x-ray. May need long-acting inhaler   LOS (Days) 7 A FACE TO FACE EVALUATION WAS PERFORMED  KIRSTEINS,ANDREW E 04/12/2015, 8:57 AM

## 2015-04-12 NOTE — Progress Notes (Signed)
Nursing reports intermittent "spells" of non-productive cough. CXR with mild central bronchitic changes. Will schedule nebs for 48 hours and monitor.  Will also add tessalon perles.

## 2015-04-12 NOTE — Progress Notes (Signed)
Recreational Therapy Session Note  Patient Details  Name: Casey Harrell MRN: [id number] Date of Birth: November 02, 1934 Today's Date: 04/12/2015  Pain: no c/o Skilled Therapeutic Interventions/Progress Updates: session focused on activity tolerance, standing balance/tolerance during co-treat with PT.  Pt stood with RW alternating feet to kick a ball and transitioning to stopping the ball and then kicking it with min assist.  Performed same activity without RW, pt required min assist with the exception of 1 LOB posteriorly in which she sat on the mat. Therapy/Group: Co-Treatment   [name] 04/12/2015, 11:30 AM

## 2015-04-12 NOTE — Progress Notes (Signed)
Continues with congested, tight cough. Audible Expiratory wheeze. Neb treatments given during the night. Incontinent of urine, requiring total assist with hygiene. Casey Harrell, Casey Harrell

## 2015-04-12 NOTE — Progress Notes (Signed)
Physical Therapy Session Note  Patient Details  Name: Casey Harrell F Munyon MRN: [id number] Date of Birth: 01-Apr-1935  Today's Date: 04/12/2015 PT Individual Time: 0830-0930 PT Individual Time Calculation (min): 60 min   Short Term Goals: Week 1:  PT Short Term Goal 1 (Week 1): Patient will perform bed mobility with min A.  PT Short Term Goal 2 (Week 1): Patient will perform bed <> wheelchair transfers with min A.  PT Short Term Goal 3 (Week 1): Patient will ambulate x 10 ft with assist of one person.  PT Short Term Goal 4 (Week 1): Patient will perform dynamic standing balance with mod A x 3 min.  PT Short Term Goal 5 (Week 1): Patient will negotiate up/down 3 stairs using L rail with mod A.   Skilled Therapeutic Interventions/Progress Updates:    Patient supine in bed wearing hospital gown.  Requests to put on clothes for session.  Spouse present and gathers clothes.  Patient supine to sit with bed rail supervision.  Seated to don bra with A for hooks and top.  Sit to stand after donning slip on shoes and ambulated with RW min A to bathroom.  Standing balance to doff brief minguard A and toileted S, min A to stand and for balance to doff briefs and pants.  Ambulated to w/c minguard and donned socks and shoes A for getting sock over toes and for tying shoes.  Dynamic balance during dressing with supervision.  Patient ambulated 150' with minguard A and spouse following with w/c.  Patient denies dizziness, but did remind her of visual targets to avoid increased dizziness with walking durint ambulation.  Patient negotiated 4 steps (6") with rail and min/mod A cues for sequencing and for attn to R hand on rail.  Patient performed habituation exercises seated head tips to L knee x 5 reps reporting only mild increased dizziness and cues for technique with speed of movement and decreased hand support.  Patient performed sit<>supine (through R sidelying) x 3 reps increased time to recover with up to 3/5 symptoms once  when lying down (note symptoms don't seem to occur until rolling back from R sidelying.)  Returned to sitting each time with min A and supervision tor transition to supine, cues for technique to increase independence.  Patient sit to stand minguard and ambulated back to room x 145' minguard A with RW.  Left seated EOB with spouse present and RN in to give medications.  Therapy Documentation Precautions:  Precautions Precautions: Fall Restrictions Weight Bearing Restrictions: No Vital Signs: Therapy Vitals Temp Source: Oral Pulse Rate: 92 Resp: 19 BP: (!) 149/95 mmHg (after ambulation) Patient Position (if appropriate): Sitting Oxygen Therapy SpO2: 97 % O2 Device: Not Delivered Pain: Pain Assessment Pain Assessment: No/denies pain   See Function Navigator for Current Functional Status.   Therapy/Group: Individual Therapy  Rudi CocoWYNN,CYNDI  Cyndi EnglishWynn, South CarolinaPT [phone] 04/12/2015  04/12/2015, 8:58 AM

## 2015-04-13 ENCOUNTER — Inpatient Hospital Stay (HOSPITAL_COMMUNITY): Payer: Medicare Other | Admitting: *Deleted

## 2015-04-13 ENCOUNTER — Inpatient Hospital Stay (HOSPITAL_COMMUNITY): Payer: Medicare Other

## 2015-04-13 ENCOUNTER — Inpatient Hospital Stay (HOSPITAL_COMMUNITY): Payer: Medicare Other | Admitting: Physical Therapy

## 2015-04-13 ENCOUNTER — Inpatient Hospital Stay (HOSPITAL_COMMUNITY): Payer: Medicare Other | Admitting: Speech Pathology

## 2015-04-13 LAB — EXPECTORATED SPUTUM ASSESSMENT W GRAM STAIN, RFLX TO RESP C

## 2015-04-13 LAB — GLUCOSE, CAPILLARY
Glucose-Capillary: 189 mg/dL — ABNORMAL HIGH (ref 65–99)
Glucose-Capillary: 181 mg/dL — ABNORMAL HIGH (ref 65–99)
Glucose-Capillary: 164 mg/dL — ABNORMAL HIGH (ref 65–99)
Glucose-Capillary: 174 mg/dL — ABNORMAL HIGH (ref 65–99)

## 2015-04-13 MED ORDER — IPRATROPIUM-ALBUTEROL 0.5-2.5 (3) MG/3ML IN SOLN
3.0000 mL | Freq: Four times a day (QID) | RESPIRATORY_TRACT | Status: AC
  Administered 2015-04-13 – 2015-04-15 (×7): 3 mL via RESPIRATORY_TRACT
  Filled 2015-04-13 (×7): qty 3

## 2015-04-13 MED ORDER — CARVEDILOL 25 MG PO TABS: 25.0000 mg | ORAL_TABLET | Freq: Two times a day (BID) | ORAL | Status: DC

## 2015-04-13 MED ORDER — CARVEDILOL 25 MG PO TABS
25.0000 mg | ORAL_TABLET | Freq: Two times a day (BID) | ORAL | Status: DC
Start: 2015-04-13 — End: 2015-04-13

## 2015-04-13 MED ORDER — CARVEDILOL 6.25 MG PO TABS
6.2500 mg | ORAL_TABLET | Freq: Two times a day (BID) | ORAL | Status: DC
  Administered 2015-04-13 – 2015-04-20 (×15): 6.25 mg via ORAL
  Filled 2015-04-13 (×15): qty 1

## 2015-04-13 NOTE — Progress Notes (Signed)
Recreational Therapy Session Note  Patient Details  Name: Casey Harrell MRN: [id number] Date of Birth: Aug 06, 1934 Today's Date: 04/13/2015  Pain: no c/o Skilled Therapeutic Interventions/Progress Updates: Pt asleep in bed upon arrival.  Easily aroused and agreeable to get up.  Pt performed bed mobility with supervision and stand pivot transfers with steadying assist.  Pt ambulated to bathroom using RW with contact guard assist and performed toileting with supervision.  Meal planning activity seated w/c level with Mod I.  Therapy/Group: Individual Therapy  Addilyn Satterwhite 04/13/2015, 3:21 PM

## 2015-04-13 NOTE — Progress Notes (Signed)
Sputum culture reordered due to insufficient amount of sputum collected. Continue with plan of care .  Cleotilde NeerJoyce, Akiba Melfi S

## 2015-04-13 NOTE — Progress Notes (Signed)
Speech Language Pathology Weekly Progress and Session Note  Patient Details  Name: Casey Harrell MRN: [id number] Date of Birth: Dec 21, 1934  Beginning of progress report period:  April 06, 2015   End of progress report period: April 13, 2015  Today's Date: 04/13/2015 SLP Individual Time: 1500-1530 SLP Individual Time Calculation (min): 30 min  Short Term Goals: Week 1: SLP Short Term Goal 1 (Week 1): Patient will utilize word-finding strategies with Min A multimodal cues at the sentence level to express basic wants/needs.  SLP Short Term Goal 1 - Progress (Week 1): Met SLP Short Term Goal 2 (Week 1): Patient will complete generative naming tasks with Min A multimodal cues.  SLP Short Term Goal 2 - Progress (Week 1): Progressing toward goal SLP Short Term Goal 3 (Week 1): Patient will follow mildly complex commands (2-3 step) with extra time and Min A multimodal cues.  SLP Short Term Goal 3 - Progress (Week 1): Met SLP Short Term Goal 4 (Week 1): Patient will complete mildly complex and familiar problem solving tasks with Min A multimodal cues.  SLP Short Term Goal 4 - Progress (Week 1): Met SLP Short Term Goal 5 (Week 1): Patient will self-monitor and correct errors during functional tasks with Min A question cues.  SLP Short Term Goal 5 - Progress (Week 1): Met SLP Short Term Goal 6 (Week 1): Patient will utilize external memory aids to recall new, daily information with Min A question and verbal cues.  SLP Short Term Goal 6 - Progress (Week 1): Progressing toward goal    New Short Term Goals: Week 2: SLP Short Term Goal 1 (Week 2): Patient will utilize word-finding strategies with supervision cues at the sentence level to express basic wants/needs over three consecutive sessions.  SLP Short Term Goal 2 (Week 2): Patient will complete generative naming tasks with Min A multimodal cues.  SLP Short Term Goal 3 (Week 2): Patient will follow mildly complex commands (2-3 step) with extra  time and supervision cues.  SLP Short Term Goal 4 (Week 2): Patient will complete mildly complex and familiar problem solving tasks with supervision cues.  SLP Short Term Goal 5 (Week 2): Patient will self-monitor and correct errors during functional tasks with supervision cues.  SLP Short Term Goal 6 (Week 2): Patient will utilize external memory aids to recall new, daily information with Min A question and verbal cues.   Weekly Progress Updates:  Pt made functional gains this reporting period and has met 4 out of 6 short term goals. Currently pt requires min assist for functional communication to convey semi-complex needs/wants to caregivers due to higher level word finding and receptive language deficits.  Furthermore, pt also requires min assist during basic tasks due to decreased emergent awareness of deficits and decreased recall of new information.  As a result, pt would continue to benefit from skilled ST while inpatient in order to maximize functional independence and reduce burden of care prior to discharge.  Pt and family education is ongoing.     Intensity: Minumum of 1-2 x/day, 30 to 90 minutes Frequency: 3 to 5 out of 7 days Duration/Length of Stay: 2-2.5 weeks  Treatment/Interventions: Cognitive remediation/compensation;Cueing hierarchy;Functional tasks;Patient/family education;Therapeutic Activities;Speech/Language facilitation;Internal/external aids;Environmental controls   Daily Session  Skilled Therapeutic Interventions: Pt was seen for skilled ST targeting cognitive goals.  Recreational therapist present upon arrival and planning a meal prep activity for late this week, early next week.  SLP facilitated the session with a recipe planning task  targeting free recall of basic information.  Pt recalled 4 out of 6 ingredients of an apple pie recipe, improving to 6 out of 6 recall with min assist question cues.  Pt also recalled time and temperature needed to bake pie with supervision  question cues.  List provided to recreational therapist.  Pt left with husband at bedside sitting up in wheelchair.  Goals updated to reflect current progress and plan of care.     Function:   Eating Eating                 Cognition Comprehension Comprehension assist level: Understands basic 90% of the time/cues < 10% of the time  Expression   Expression assist level: Expresses basic 75 - 89% of the time/requires cueing 10 - 24% of the time. Needs helper to occlude trach/needs to repeat words.  Social Interaction Social Interaction assist level: Interacts appropriately 90% of the time - Needs monitoring or encouragement for participation or interaction.  Problem Solving Problem solving assist level: Solves basic 50 - 74% of the time/requires cueing 25 - 49% of the time  Memory Memory assist level: Recognizes or recalls 50 - 74% of the time/requires cueing 25 - 49% of the time   General    Pain Pain Assessment Pain Assessment: No/denies pain  Therapy/Group: Individual Therapy  Vylet Maffia, Melanee Spry 04/13/2015, 4:17 PM

## 2015-04-13 NOTE — Progress Notes (Addendum)
Occupational Therapy Weekly Progress Note  Patient Details  Name: Casey Harrell MRN: [id number] Date of Birth: March 14, 1935  Beginning of progress report period: April 06, 2015 End of progress report period: April 13, 2015  Today's Date: 04/13/2015 OT Individual Time: 1000-1115 OT Individual Time Calculation (min): 75 min    Patient has met 5 of 5 short term goals.  Pt progressing well with functional use of RUE during BADL however requires close supervision for safety and problem-solving.   Family members present during treatments and educated on primary deficits.     Patient continues to demonstrate the following deficits: Impaired attention, R-hemiparesis, impaired dynamic standing balance, and therefore will continue to benefit from skilled OT intervention to enhance overall performance with BADL.  Patient progressing toward long term goals..  Continue plan of care.  OT Short Term Goals Week 1:  OT Short Term Goal 1 (Week 1): Pt will complete stand pivot transfer to toilet with min assist OT Short Term Goal 1 - Progress (Week 1): Met OT Short Term Goal 2 (Week 1): Pt will complete shower transfers with min assist with LRAD OT Short Term Goal 2 - Progress (Week 1): Met OT Short Term Goal 3 (Week 1): Pt will complete LB dressing with mod assist OT Short Term Goal 3 - Progress (Week 1): Met OT Short Term Goal 4 (Week 1): Pt will complete UB dressing with min assist OT Short Term Goal 4 - Progress (Week 1): Met OT Short Term Goal 5 (Week 1): Pt will complete 1 grooming task in standing with min assist OT Short Term Goal 5 - Progress (Week 1): Met Week 2:  OT Short Term Goal 1 (Week 2): STG=LTG d/t short remaining LOS.  Skilled Therapeutic Interventions/Progress Updates: ADL-retraining (60 min) at shower level with focus on improved lower body dressing (using elastic shoe laces), improved awareness, and dynamic standing balance.   Pt ambulates to bathroom with contact guard, requesting  to toilet before bathing.   Pt toilets with standby assist and steadying assist d/t LOB while standing to doff briefs.   Pt ambulates to tub bench with hand held guidance and bathes with mod vc/supervision to problem-solve, using grab bar while standing to wash buttocks.   Pt is frequently unable to place hand shower while washing and bathing and requires assist with managing AE.   Pt dressed at [facility] with setup, donning socks and shoes this session after setup to provide elastic laces.   Pt ambulated to sink with min guard and groomed standing with intermittent contact guard and min vc to problem solve.    Therapeutic exercises (15 min) using theraputty (yellow, med-soft) and elastic bands to improve right hand grip/pinch strength and FMC.   Pt completed 4 written exercises as husband observed with mod instructional cues and hand guidance to perform exercises correctly.     Therapy Documentation Precautions:  Precautions Precautions: Fall Restrictions Weight Bearing Restrictions: No  Vital Signs: Oxygen Therapy SpO2: 95 % O2 Device: Not Delivered   Pain: No/denies pain    See Function Navigator for Current Functional Status.   Therapy/Group: Individual Therapy  [place] 04/13/2015, 2:05 PM

## 2015-04-13 NOTE — Progress Notes (Signed)
Physical Therapy Session Note  Patient Details  Name: Casey Harrell MRN: [id number] Date of Birth: [birth year]  Today's Date: 04/13/2015 PT Individual Time: 0800-0900 PT Individual Time Calculation (min): 60 min   Short Term Goals: Week 1:  PT Short Term Goal 1 (Week 1): Patient will perform bed mobility with min A.  PT Short Term Goal 2 (Week 1): Patient will perform bed <> wheelchair transfers with min A.  PT Short Term Goal 3 (Week 1): Patient will ambulate x 10 ft with assist of one person.  PT Short Term Goal 4 (Week 1): Patient will perform dynamic standing balance with mod A x 3 min.  PT Short Term Goal 5 (Week 1): Patient will negotiate up/down 3 stairs using L rail with mod A.   Skilled Therapeutic Interventions/Progress Updates:   Session focused on functional mobility training, vestibular rehab, and activity tolerance. Patient in bed with RN present and [facility] and use of rail with supervision to don clean gown and sat edge of bed with supervision to take medications. Patient performed sit <> stand with supervision and cues for safe hand placement/R hand orthosis using RW and ambulated to bathroom with min guard. Patient stood at sink for hand hygiene with supervision and ambulated to gym x 130 ft with close supervision. Seated edge of mat, performed habituation exercises with head tipping toward L knee x 3, first trial with 5/10 dizziness that resolved within 30 sec and no dizziness by last trial and toward R knee x 3 with minimal to no dizziness. Performed sit <> supine going to R x 3, 5/10 dizziness with sit > supine with each trial and patient less symptomatic with supine > sit. Patient ambulated over obstacle course including over thresholds and between cones without AD with min guard.  Patient ambulated 2 x 50 ft without AD with min guard and cues for increased R step length for fluidity with reciprocal gait pattern. Stair training up/down 4 (6")  stairs using L rail ascending forwards and descending laterally with min guard and step-to pattern. Gait using RW without hand orthosis x 130 ft with supervision.Patient no longer requires R hand orthosis on RW due to adequate RUE grip strength. Patient left sitting in wheelchair with cousin present and needs within reach.   Therapy Documentation Precautions:  Precautions Precautions: Fall Restrictions Weight Bearing Restrictions: No Vital Signs: Seated BP 157/80, HR 86, Sp02 95% on ra Pain: Pain Assessment Pain Assessment: No/denies pain   See Function Navigator for Current Functional Status.   Therapy/Group: Individual Therapy  Kerney ElbeVarner, Leticia Mcdiarmid A 04/13/2015, 8:54 AM

## 2015-04-14 ENCOUNTER — Inpatient Hospital Stay (HOSPITAL_COMMUNITY): Payer: Medicare Other | Admitting: Occupational Therapy

## 2015-04-14 ENCOUNTER — Inpatient Hospital Stay (HOSPITAL_COMMUNITY): Payer: Medicare Other | Admitting: Physical Therapy

## 2015-04-14 ENCOUNTER — Inpatient Hospital Stay (HOSPITAL_COMMUNITY): Payer: Medicare Other | Admitting: *Deleted

## 2015-04-14 ENCOUNTER — Inpatient Hospital Stay (HOSPITAL_COMMUNITY): Payer: Medicare Other | Admitting: Speech Pathology

## 2015-04-14 LAB — GLUCOSE, CAPILLARY
Glucose-Capillary: 138 mg/dL — ABNORMAL HIGH (ref 65–99)
Glucose-Capillary: 92 mg/dL (ref 65–99)
Glucose-Capillary: 116 mg/dL — ABNORMAL HIGH (ref 65–99)
Glucose-Capillary: 192 mg/dL — ABNORMAL HIGH (ref 65–99)

## 2015-04-14 LAB — CBC
HCT: 40 % (ref 36.0–46.0)
Hemoglobin: 12.9 g/dL (ref 12.0–15.0)
MCH: 26.2 pg (ref 26.0–34.0)
MCHC: 32.3 g/dL (ref 30.0–36.0)
MCV: 81.1 fL (ref 78.0–100.0)
Platelets: ADEQUATE 10*3/uL (ref 150–400)
RBC: 4.93 MIL/uL (ref 3.87–5.11)
RDW: 16.9 % — ABNORMAL HIGH (ref 11.5–15.5)
WBC: 8.2 10*3/uL (ref 4.0–10.5)

## 2015-04-14 LAB — EXPECTORATED SPUTUM ASSESSMENT W GRAM STAIN, RFLX TO RESP C: Special Requests: NORMAL

## 2015-04-14 MED ORDER — BUDESONIDE 0.25 MG/2ML IN SUSP
0.2500 mg | Freq: Two times a day (BID) | RESPIRATORY_TRACT | Status: DC
  Administered 2015-04-14 – 2015-04-20 (×12): 0.25 mg via RESPIRATORY_TRACT
  Filled 2015-04-14 (×14): qty 2

## 2015-04-14 NOTE — Progress Notes (Signed)
Occupational Therapy Session Note  Patient Details  Name: Casey Harrell MRN: [id number] Date of Birth: [birth year]  Today's Date: 04/14/2015 OT Individual Time: 2778-2423 OT Individual Time Calculation (min): 45 min    Short Term Goals: Week 1:  OT Short Term Goal 1 (Week 1): Pt will complete stand pivot transfer to toilet with min assist OT Short Term Goal 1 - Progress (Week 1): Met OT Short Term Goal 2 (Week 1): Pt will complete shower transfers with min assist with LRAD OT Short Term Goal 2 - Progress (Week 1): Met OT Short Term Goal 3 (Week 1): Pt will complete LB dressing with mod assist OT Short Term Goal 3 - Progress (Week 1): Met OT Short Term Goal 4 (Week 1): Pt will complete UB dressing with min assist OT Short Term Goal 4 - Progress (Week 1): Met OT Short Term Goal 5 (Week 1): Pt will complete 1 grooming task in standing with min assist OT Short Term Goal 5 - Progress (Week 1): Met Week 2:  OT Short Term Goal 1 (Week 2): STG=LTG d/t short remaining LOS.  Skilled Therapeutic Interventions/Progress Updates:    1:1 self care retraining at sink level. Focus on bed mobility from a flat bed without rails with VC and more than reasonable time, functional ambulation from bed to bathroom with RW with steadying A to supervision, toileting with occasional steadying A. Bathing and dressing at sink level with focus on coordination and stregth of right hand to manipulate clothing and grooming items. PT with increased grip strength with increased visual attention to hand. Worked on fastening bra once around her waist with A to hold loop side on right. Discussed donning over head.  Pt often alternates hands to complete a functional tasks; after beginning with right hand.  Therapy Documentation Precautions:  Precautions Precautions: Fall Restrictions Weight Bearing Restrictions: No    Vital Signs: Therapy Vitals Temp: 98 F (36.7 C) Temp Source: Oral Pulse Rate: 72 Resp: 17 BP: (!)  156/66 mmHg Patient Position (if appropriate): Orthostatic Vitals Oxygen Therapy SpO2: 96 % O2 Device: Not Delivered Pain:  no c/o pain   See Function Navigator for Current Functional Status.   Therapy/Group: Individual Therapy  Willeen Cass [facility] 04/14/2015, 8:14 AM

## 2015-04-14 NOTE — Progress Notes (Signed)
Sputum specimen cup on bedside table in reach for patient. Unable to have a productive cough at this time. RT made patient aware that if she were to cough up a sample to call RT or RN.

## 2015-04-14 NOTE — Progress Notes (Signed)
Subjective/Complaints: Pt coughing, is on nebulizer but no steroids, no fever, discussed CXR results with pt and husband  Review of systems no breathing problems no bowel or bladder issues, no joint or muscle pains.  Objective: Vital Signs: Blood pressure 156/66, pulse 72, temperature 98 F (36.7 C), temperature source Oral, resp. rate 17, height '5\' 1"'  (1.549 m), weight 67.5 kg (148 lb 13 oz), SpO2 96 %. Dg Chest 2 View  04/12/2015  CLINICAL DATA:  Productive cough starting last night EXAM: CHEST  2 VIEW COMPARISON:  03/31/2015 FINDINGS: Cardiomediastinal silhouette is stable. No acute infiltrate or pleural effusion. No pulmonary edema. Mild perihilar bronchitic changes. Mild degenerative changes thoracic spine. IMPRESSION: No acute infiltrate or pulmonary edema. Central mild bronchitic changes. Electronically Signed   By: Lahoma Crocker M.D.   On: 04/12/2015 12:38   Results for orders placed or performed during the hospital encounter of 04/05/15 (from the past 72 hour(s))  Glucose, capillary     Status: Abnormal   Collection Time: 04/11/15 11:19 AM  Result Value Ref Range   Glucose-Capillary 289 (H) 65 - 99 mg/dL   Comment 1 Notify RN   Glucose, capillary     Status: Abnormal   Collection Time: 04/11/15  4:24 PM  Result Value Ref Range   Glucose-Capillary 306 (H) 65 - 99 mg/dL   Comment 1 Notify RN   Glucose, capillary     Status: Abnormal   Collection Time: 04/11/15  8:33 PM  Result Value Ref Range   Glucose-Capillary 231 (H) 65 - 99 mg/dL  Creatinine, serum     Status: None   Collection Time: 04/12/15  6:44 AM  Result Value Ref Range   Creatinine, Ser 0.82 0.44 - 1.00 mg/dL   GFR calc non Af Amer >60 >60 mL/min   GFR calc Af Amer >60 >60 mL/min    Comment: (NOTE) The eGFR has been calculated using the CKD EPI equation. This calculation has not been validated in all clinical situations. eGFR's persistently <60 mL/min signify possible Chronic Kidney Disease.   Glucose,  capillary     Status: Abnormal   Collection Time: 04/12/15  7:00 AM  Result Value Ref Range   Glucose-Capillary 214 (H) 65 - 99 mg/dL  Glucose, capillary     Status: Abnormal   Collection Time: 04/12/15 11:37 AM  Result Value Ref Range   Glucose-Capillary 189 (H) 65 - 99 mg/dL  Glucose, capillary     Status: Abnormal   Collection Time: 04/12/15  4:51 PM  Result Value Ref Range   Glucose-Capillary 208 (H) 65 - 99 mg/dL   Comment 1 Notify RN   Glucose, capillary     Status: Abnormal   Collection Time: 04/12/15  8:51 PM  Result Value Ref Range   Glucose-Capillary 183 (H) 65 - 99 mg/dL  Glucose, capillary     Status: Abnormal   Collection Time: 04/13/15  6:50 AM  Result Value Ref Range   Glucose-Capillary 189 (H) 65 - 99 mg/dL  Glucose, capillary     Status: Abnormal   Collection Time: 04/13/15 12:20 PM  Result Value Ref Range   Glucose-Capillary 181 (H) 65 - 99 mg/dL   Comment 1 Notify RN   Culture, expectorated sputum-assessment     Status: None   Collection Time: 04/13/15  3:38 PM  Result Value Ref Range   Specimen Description SPUTUM    Special Requests NONE    Sputum evaluation      MICROSCOPIC FINDINGS SUGGEST THAT THIS  SPECIMEN IS NOT REPRESENTATIVE OF LOWER RESPIRATORY SECRETIONS. PLEASE RECOLLECT. NOTIFIED A JOYCE RN 1800 04/13/15 A BROWNING    Report Status 04/13/2015 FINAL   Glucose, capillary     Status: Abnormal   Collection Time: 04/13/15  5:16 PM  Result Value Ref Range   Glucose-Capillary 164 (H) 65 - 99 mg/dL   Comment 1 Notify RN   Glucose, capillary     Status: Abnormal   Collection Time: 04/13/15  9:26 PM  Result Value Ref Range   Glucose-Capillary 174 (H) 65 - 99 mg/dL   Comment 1 Notify RN   CBC     Status: Abnormal   Collection Time: 04/14/15  4:50 AM  Result Value Ref Range   WBC 8.2 4.0 - 10.5 K/uL    Comment: WHITE COUNT CONFIRMED ON SMEAR REPEATED TO VERIFY    RBC 4.93 3.87 - 5.11 MIL/uL   Hemoglobin 12.9 12.0 - 15.0 g/dL   HCT 40.0 36.0 -  46.0 %   MCV 81.1 78.0 - 100.0 fL   MCH 26.2 26.0 - 34.0 pg   MCHC 32.3 30.0 - 36.0 g/dL   RDW 16.9 (H) 11.5 - 15.5 %   Platelets  150 - 400 K/uL    PLATELET CLUMPS NOTED ON SMEAR, COUNT APPEARS ADEQUATE  Glucose, capillary     Status: Abnormal   Collection Time: 04/14/15  7:06 AM  Result Value Ref Range   Glucose-Capillary 138 (H) 65 - 99 mg/dL   Comment 1 Notify RN      HEENT: normal Cardio: RRR and no murmur Resp: CTA except expiratory wheezes,  unlabored GI: BS positive and NT, ND Extremity:  No Edema Skin:   Intact Neuro: Alert/Oriented, Abnormal Sensory reduced LT, proprio RUE, Abnormal Motor 2- R delt bi tri grip, 4/5 RLE, 5/5 on left side, Abnormal FMC Ataxic/ dec FMC, Inattention and Other R HH Musc/Skel:  Other no pain with UE or LE ROM Gen NAD   Assessment/Plan: 1. Functional deficits secondary to Right hemiparesis, Right homonymous hemianopsia, cognitive deficits related to CVA which require 3+ hours per day of interdisciplinary therapy in a comprehensive inpatient rehab setting. Physiatrist is providing close team supervision and 24 hour management of active medical problems listed below. Physiatrist and rehab team continue to assess barriers to discharge/monitor patient progress toward functional and medical goals. FIM: Function - Bathing Position: Shower Body parts bathed by patient: Right arm, Left arm, Chest, Abdomen, Front perineal area, Buttocks, Right upper leg, Left upper leg, Right lower leg, Left lower leg, Back Body parts bathed by helper: Back Assist Level: Touching or steadying assistance(Pt > 75%)  Function- Upper Body Dressing/Undressing What is the patient wearing?: Bra, Pull over shirt/dress Bra - Perfomed by patient: Thread/unthread right bra strap, Thread/unthread left bra strap Bra - Perfomed by helper: Hook/unhook bra (pull down sports bra) Pull over shirt/dress - Perfomed by patient: Thread/unthread right sleeve, Thread/unthread left sleeve,  Put head through opening, Pull shirt over trunk Pull over shirt/dress - Perfomed by helper: Thread/unthread right sleeve, Thread/unthread left sleeve, Put head through opening, Pull shirt over trunk Assist Level: Set up Set up : To obtain clothing/put away Function - Lower Body Dressing/Undressing What is the patient wearing?: Pants, Socks, Shoes, Underwear (pull up briefs as underwear) Position: Other (comment) (sitting on BSC) Underwear - Performed by patient: Thread/unthread right underwear leg, Thread/unthread left underwear leg, Pull underwear up/down Underwear - Performed by helper: Pull underwear up/down Pants- Performed by patient: Thread/unthread right pants leg, Thread/unthread left  pants leg, Pull pants up/down, Fasten/unfasten pants Pants- Performed by helper: Pull pants up/down Non-skid slipper socks- Performed by helper: Don/doff right sock, Don/doff left sock Socks - Performed by patient: Don/doff right sock, Don/doff left sock Socks - Performed by helper: Don/doff right sock, Don/doff left sock (assisted with part of task) Shoes - Performed by patient: Don/doff right shoe, Don/doff left shoe, Fasten right, Fasten left (used elastic laces provided) Shoes - Performed by helper: Fasten right, Fasten left Assist for footwear: Setup Assist for lower body dressing: Touching or steadying assistance (Pt > 75%)  Function - Toileting Toileting steps completed by patient: Adjust clothing prior to toileting, Performs perineal hygiene, Adjust clothing after toileting Toileting steps completed by helper: Adjust clothing after toileting Toileting Assistive Devices: Grab bar or rail Assist level: Touching or steadying assistance (Pt.75%)  Function - Toilet Transfers Toilet transfer assistive device: Elevated toilet seat/BSC over toilet, Walker Assist level to toilet: Supervision or verbal cues Assist level from toilet: Supervision or verbal cues  Function - Chair/bed transfer Chair/bed  transfer method: Ambulatory Chair/bed transfer assist level: Touching or steadying assistance (Pt > 75%) Chair/bed transfer assistive device: Armrests, Walker Chair/bed transfer details: Verbal cues for safe use of DME/AE  Function - Locomotion: Wheelchair Will patient use wheelchair at discharge?: No Type: Manual Max wheelchair distance: 125 Assist Level: Dependent (Pt equals 0%) Assist Level: Dependent (Pt equals 0%) Wheel 150 feet activity did not occur: Safety/medical concerns Assist Level: Dependent (Pt equals 0%) Turns around,maneuvers to table,bed, and toilet,negotiates 3% grade,maneuvers on rugs and over doorsills: No Function - Locomotion: Ambulation Assistive device: Walker-rolling, Orthosis Max distance: 130 Assist level: Touching or steadying assistance (Pt > 75%) Walk 10 feet activity did not occur: Safety/medical concerns Assist level: Touching or steadying assistance (Pt > 75%) Walk 50 feet with 2 turns activity did not occur: Safety/medical concerns Assist level: Touching or steadying assistance (Pt > 75%) Walk 150 feet activity did not occur: Safety/medical concerns Assist level: No help, No cues, assistive device, takes more than a reasonable amount of time Walk 10 feet on uneven surfaces activity did not occur: Safety/medical concerns Assist level: No help, No cues, assistive device, takes more than a reasonable amount of time  Function - Comprehension Comprehension: Auditory Comprehension assist level: Understands basic 90% of the time/cues < 10% of the time  Function - Expression Expression: Verbal Expression assist level: Expresses basic 75 - 89% of the time/requires cueing 10 - 24% of the time. Needs helper to occlude trach/needs to repeat words.  Function - Social Interaction Social Interaction assist level: Interacts appropriately 90% of the time - Needs monitoring or encouragement for participation or interaction.  Function - Problem Solving Problem  solving assist level: Solves basic 50 - 74% of the time/requires cueing 25 - 49% of the time  Function - Memory Memory assist level: Recognizes or recalls 50 - 74% of the time/requires cueing 25 - 49% of the time Patient normally able to recall (first 3 days only): That he or she is in a hospital, Current season  Medical Problem List and Plan: 1.  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 Formulized individual treatment plan based on medical history, underlying problem and comorbidities.Team conference today please see physician documentation under team conference tab, met with team face-to-face to discuss problems,progress, and goals. Formulized individual treatment plan based on  medical history, underlying problem and comorbidities. 2.  DVT Prophylaxis/Anticoagulation: Pharmaceutical: Lovenox 3. Pain Management:  Tylenol prn 4. Mood: LCSW to follow for evaluation and support.   5. Neuropsych: This patient is not capable of making decisions onher own behalf. 6. Skin/Wound Care: Routine pressure relief measures. Maintain adequate nutrition and hydration status 7. Fluids/Electrolytes/Nutrition:  Monitor I/O. Check lytes in am.   8. DM type 2:   Home regimen included Invokana 300 mg, Metformin 1000 mg bid, Levemir 40 mg bid and Novolog 20-10-20 ac. Monitor BS ac/hs and  levimir 40U BID, will d/c metformin and monitor bowel pattern, blood sugar 138, increase novalog to 8 units with meals 9. HTN: Monitor BP every 8 hours.  Check orthostatic BP in am and slowly resume medications as indicated. Continue coreg bid.   HR 77, BP controlled at 158/66 today 10. Diabetic Neuropathy:  Elavil was dc due to concerns of SE--Continue lyrica. and cymbalta, no complaints of neuropathic pain  today 11. Leucocytosis: WBC on upward trend. Will monitor for fevers and other signs of infection.   12. Hypokalemia:  Will supplement for now and recheck in am.  Likely dilutional due to IVF which were d/c today.   13. Klebsiella UTI: resolved 14. OSA: Does not tolerate CPAP 15. Intermittent diarrhea x few months:  May be metformin related, will stop and monitor, soft continent BM yesterday. Continue off metformin 16. COPD with chronic bronchitis/cough:lung exam normal today  Continue nebs [employer].no of cough will recheck chest x-ray. May need long-acting inhaler   LOS (Days) 9 A FACE TO FACE EVALUATION WAS PERFORMED  Jada Kuhnert E 04/14/2015, 8:08 AM

## 2015-04-14 NOTE — Progress Notes (Signed)
Speech Language Pathology Daily Session Note  Patient Details  Name: Casey Harrell MRN: [id number] Date of Birth: 1935-01-11  Today's Date: 04/14/2015 SLP Individual Time: 0930-1030 SLP Individual Time Calculation (min): 60 min  Short Term Goals: Week 2: SLP Short Term Goal 1 (Week 2): Patient will utilize word-finding strategies with supervision cues at the sentence level to express basic wants/needs over three consecutive sessions.  SLP Short Term Goal 2 (Week 2): Patient will complete generative naming tasks with Min A multimodal cues.  SLP Short Term Goal 3 (Week 2): Patient will follow mildly complex commands (2-3 step) with extra time and supervision cues.  SLP Short Term Goal 4 (Week 2): Patient will complete mildly complex and familiar problem solving tasks with supervision cues.  SLP Short Term Goal 5 (Week 2): Patient will self-monitor and correct errors during functional tasks with supervision cues.  SLP Short Term Goal 6 (Week 2): Patient will utilize external memory aids to recall new, daily information with Min A question and verbal cues.   Skilled Therapeutic Interventions:  Pt was seen for skilled ST targeting communication goals.  Pt was able to name to description in 24 out of 25 opportunities with min assist, improving to 25 out of 25 with mod assist verbal cues.  SLP also facilitated the session with a structured categorical generation task to further address functional word finding.   Pt was able to generate at least 3 category members in 12 out of 15 opportunities, improving to 13 out of 15 with min assist verbal cues, and 15 out of 15 opportunities with mod assist verbal cues.   SLP further increased challenge for communication with a barrier task targeting use of compensatory word finding strategies.  Pt was able to describe pictures of occupations in 21 out of 23 opportunities, improving to 22 out of 23 with min assist verbal cues.  Pt was returned to room and left in  wheelchair with husband at bedside.  Continue per current plan of care.   Function:  Eating Eating                 Cognition Comprehension Comprehension assist level: Understands basic 90% of the time/cues < 10% of the time  Expression   Expression assist level: Expresses basic 75 - 89% of the time/requires cueing 10 - 24% of the time. Needs helper to occlude trach/needs to repeat words.  Social Interaction Social Interaction assist level: Interacts appropriately 90% of the time - Needs monitoring or encouragement for participation or interaction.  Problem Solving Problem solving assist level: Solves basic 50 - 74% of the time/requires cueing 25 - 49% of the time  Memory Memory assist level: Recognizes or recalls 50 - 74% of the time/requires cueing 25 - 49% of the time    Pain Pain Assessment Pain Assessment: No/denies pain  Therapy/Group: Individual Therapy  Edlin Ford, Melanee SpryNicole L 04/14/2015, 4:22 PM

## 2015-04-14 NOTE — Progress Notes (Signed)
Physical Therapy Session Note  Patient Details  Name: Casey Harrell MRN: [id number] Date of Birth: 1935/03/15  Today's Date: 04/14/2015 PT Individual Time: 1435-1500 PT Individual Time Calculation (min): 25 min   Short Term Goals: Week 1:  PT Short Term Goal 1 (Week 1): Patient will perform bed mobility with min A.  PT Short Term Goal 2 (Week 1): Patient will perform bed <> wheelchair transfers with min A.  PT Short Term Goal 3 (Week 1): Patient will ambulate x 10 ft with assist of one person.  PT Short Term Goal 4 (Week 1): Patient will perform dynamic standing balance with mod A x 3 min.  PT Short Term Goal 5 (Week 1): Patient will negotiate up/down 3 stairs using L rail with mod A.   Skilled Therapeutic Interventions/Progress Updates:   Patient resting in bed, requesting to use bathroom. Patient transferred to edge of bed using rail and donned shoes with supervision. Patient ambulated to/from bathroom without AD with min guard and performed toilet transfer, doffed soiled/donned clean brief, and pulled pants up/down with close supervision and increased time. Patient performed hand hygiene at sink with supervision and required seated rest break EOB. Gait without AD x 100 ft with min guard and verbal cues for increased R step length and to decrease "furniture" walking. Patient left sitting edge of bed per request with husband present and all needs within reach.   Therapy Documentation Precautions:  Precautions Precautions: Fall Restrictions Weight Bearing Restrictions: No Pain: Pain Assessment Pain Assessment: No/denies pain  See Function Navigator for Current Functional Status.   Therapy/Group: Individual Therapy  Kerney ElbeVarner, Takeia Ciaravino A 04/14/2015, 3:08 PM

## 2015-04-14 NOTE — Progress Notes (Signed)
Physical Therapy Session Note  Patient Details  Name: Casey Harrell MRN: [id number] Date of Birth: [birth year]  Today's Date: 04/14/2015 PT Individual Time: 1100-1200 PT Individual Time Calculation (min): 60 min   Short Term Goals: Week 1:  PT Short Term Goal 1 (Week 1): Patient will perform bed mobility with min A.  PT Short Term Goal 2 (Week 1): Patient will perform bed <> wheelchair transfers with min A.  PT Short Term Goal 3 (Week 1): Patient will ambulate x 10 ft with assist of one person.  PT Short Term Goal 4 (Week 1): Patient will perform dynamic standing balance with mod A x 3 min.  PT Short Term Goal 5 (Week 1): Patient will negotiate up/down 3 stairs using L rail with mod A.   Skilled Therapeutic Interventions/Progress Updates:    Pt received up in w/c with husband present - passed off from Rec Therapy. Vitals taken prior to ambulation. Gait Training - PT instructs pt in ambulation with RW x 130' req steadying assist - pt denies lightheadedness or dizziness. PT instructs pt in ascending/descending 4 steps (6" height) with B rail req steadying assist. Husband reports rails are further apart at home, so 2 hands on 1 rail will be practiced, tomorrow. Neuromuscular Reeducation - PT instructs pt in vestibular exercises compensation prior to sit to stand, cueing pt to focus eyes on stationary object during sit to stand and she does so without c/o dizziness. PT instructs pt in vestibular habituation exercise in sit: tipping head towards L knee 3 reps - pt c/o 3/10 dizziness on first trial req 20 seconds to recover, then 2/10 dizziness second trial req 25 seconds to recover, then 0/10 dizziness on 3rd trial. PT instructs pt in saccadic eye movement between 2 targets: R and L, then up/down x 10 reps each. PT instructs pt in habituation exercises of sit to/from supine x 3 reps - pt c/o 6/10 dizziness lasting 20 seconds first sit to supine then 3/10 dizziness lasting 5 seconds first supine to sit; 3/10  dizziness lasting 10 seconds then 5/10 dizziness lasting 15 seconds on second rep; finally 1/10 dizziness lasting a few seconds then no dizziness on final supine to sit transfer. Pt becomes fatigued with this activity and initially req min A sup to sit, progressing to mod A with fatigue. Also, pt begins demonstrating self compensations - moving slower and fixing gaze on stationary object to reduce magnitude of dizziness on provocative movements. PT instructs pt in R UE NMR activity - w/c propulsion with B UEs req min A from PT to assist in R hand contact on wheel x 35'. Pt ended up in w/c with husband present and all needs in reach, awaiting lunch. Pt is continuing to make slow progress. Continue per PT POC.   Therapy Documentation Precautions:  Precautions Precautions: Fall Restrictions Weight Bearing Restrictions: No Vital Signs: Therapy Vitals Pulse Rate: 64 Resp: 16 BP: 127/66 mmHg Patient Position (if appropriate): Sitting Oxygen Therapy SpO2: 98 % O2 Device: Not Delivered Pain: Pain Assessment Pain Assessment: No/denies pain   See Function Navigator for Current Functional Status.   Therapy/Group: Individual Therapy  Stephanne Greeley M 04/14/2015, 11:07 AM

## 2015-04-14 NOTE — Progress Notes (Signed)
Recreational Therapy Session Note  Patient Details , Name: Casey Harrell MRN: [id number] Date of Birth: 07/05/1934 Today's Date: 04/14/2015  Pain: no c/o Skilled Therapeutic Interventions/Progress Updates: session focused on activity tolerance, toileting, ambulation using RW, standing balance during out & about activity.  Pt ambulated to the bathroom using RW for toileting & to sink to wash hands with contact guard assist.  Pt ambulated throughout the gift shop using RW with contact guard assist ~10 minutes before needing to sit down to rest.  Pt required min questioning cues to monitor energy level.  Pt's husband present & participatory in session.  Therapy/Group: Individual Therapy  Mervin Ramires 04/14/2015, 12:00 PM

## 2015-04-15 ENCOUNTER — Inpatient Hospital Stay (HOSPITAL_COMMUNITY): Payer: Medicare Other

## 2015-04-15 ENCOUNTER — Inpatient Hospital Stay (HOSPITAL_COMMUNITY): Payer: Medicare Other | Admitting: Speech Pathology

## 2015-04-15 ENCOUNTER — Inpatient Hospital Stay (HOSPITAL_COMMUNITY): Payer: Medicare Other | Admitting: Physical Therapy

## 2015-04-15 ENCOUNTER — Ambulatory Visit (HOSPITAL_COMMUNITY): Payer: Medicare Other | Admitting: Occupational Therapy

## 2015-04-15 LAB — GLUCOSE, CAPILLARY
Glucose-Capillary: 139 mg/dL — ABNORMAL HIGH (ref 65–99)
Glucose-Capillary: 98 mg/dL (ref 65–99)
Glucose-Capillary: 157 mg/dL — ABNORMAL HIGH (ref 65–99)
Glucose-Capillary: 173 mg/dL — ABNORMAL HIGH (ref 65–99)

## 2015-04-15 NOTE — Progress Notes (Signed)
Occupational Therapy Note  Patient Details  Name: Atilano MedianKay F Trickett MRN: [id number] Date of Birth: 09-27-1934  Today's Date: 04/15/2015 OT Individual Time: 0830-0900 OT Individual Time Calculation (min): 30 min   Pt denied pain Individual Therapy  Pt resting in bed upon arrival with husband present.  Pt initially engaged in grooming tasks while standing at sink.  Pt transitioned to RUE/hand therapeutic tasks with theraputty, following directions from handout supplies by previous OT.  Pt stood at sink with supervision.  Pt initiated use of RUE to brush teeth and assist with all grooming tasks.  Focus on activity tolerance, sit<>stand, standing balance, and RUE therapeutic activities to increase independence with BADLs.   Lavone NeriLanier, Kayven Aldaco [facility] 04/15/2015, 9:02 AM

## 2015-04-15 NOTE — Progress Notes (Signed)
Social Work Patient ID: Casey Harrell, female   DOB: [birth year], 80 y.o.   MRN: [id number] Spoken with [employer] she reports her medical director has approved pt to stay until 12/13, not 12/14. Will move discharge to Tuesday. Have spoken with pt and husband along with Team and they are agreeable to this plan. Plan for discharge on Tuesday.

## 2015-04-15 NOTE — Progress Notes (Signed)
Subjective/Complaints: Cough is better, no sweats or chills, reviewed labs including CBC and sputum culture.  Review of systems no breathing problems no bowel or bladder issues, no joint or muscle pains.  Objective: Vital Signs: Blood pressure 156/67, pulse 65, temperature 97.3 F (36.3 C), temperature source Oral, resp. rate 16, height 5\' 1"  (1.549 m), weight 67.5 kg (148 lb 13 oz), SpO2 96 %. No results found. Results for orders placed or performed during the hospital encounter of 04/05/15 (from the past 72 hour(s))  Glucose, capillary     Status: Abnormal   Collection Time: 04/12/15 11:37 AM  Result Value Ref Range   Glucose-Capillary 189 (H) 65 - 99 mg/dL  Glucose, capillary     Status: Abnormal   Collection Time: 04/12/15  4:51 PM  Result Value Ref Range   Glucose-Capillary 208 (H) 65 - 99 mg/dL   Comment 1 Notify RN   Glucose, capillary     Status: Abnormal   Collection Time: 04/12/15  8:51 PM  Result Value Ref Range   Glucose-Capillary 183 (H) 65 - 99 mg/dL  Glucose, capillary     Status: Abnormal   Collection Time: 04/13/15  6:50 AM  Result Value Ref Range   Glucose-Capillary 189 (H) 65 - 99 mg/dL  Glucose, capillary     Status: Abnormal   Collection Time: 04/13/15 12:20 PM  Result Value Ref Range   Glucose-Capillary 181 (H) 65 - 99 mg/dL   Comment 1 Notify RN   Culture, expectorated sputum-assessment     Status: None   Collection Time: 04/13/15  3:38 PM  Result Value Ref Range   Specimen Description SPUTUM    Special Requests NONE    Sputum evaluation      MICROSCOPIC FINDINGS SUGGEST THAT THIS SPECIMEN IS NOT REPRESENTATIVE OF LOWER RESPIRATORY SECRETIONS. PLEASE RECOLLECT. NOTIFIED A JOYCE RN 1800 04/13/15 A BROWNING    Report Status 04/13/2015 FINAL   Glucose, capillary     Status: Abnormal   Collection Time: 04/13/15  5:16 PM  Result Value Ref Range   Glucose-Capillary 164 (H) 65 - 99 mg/dL   Comment 1 Notify RN   Glucose, capillary     Status: Abnormal    Collection Time: 04/13/15  9:26 PM  Result Value Ref Range   Glucose-Capillary 174 (H) 65 - 99 mg/dL   Comment 1 Notify RN   CBC     Status: Abnormal   Collection Time: 04/14/15  4:50 AM  Result Value Ref Range   WBC 8.2 4.0 - 10.5 K/uL    Comment: WHITE COUNT CONFIRMED ON SMEAR REPEATED TO VERIFY    RBC 4.93 3.87 - 5.11 MIL/uL   Hemoglobin 12.9 12.0 - 15.0 g/dL   HCT 95.240.0 84.136.0 - 32.446.0 %   MCV 81.1 78.0 - 100.0 fL   MCH 26.2 26.0 - 34.0 pg   MCHC 32.3 30.0 - 36.0 g/dL   RDW 40.116.9 (H) 02.711.5 - 25.315.5 %   Platelets  150 - 400 K/uL    PLATELET CLUMPS NOTED ON SMEAR, COUNT APPEARS ADEQUATE  Glucose, capillary     Status: Abnormal   Collection Time: 04/14/15  7:06 AM  Result Value Ref Range   Glucose-Capillary 138 (H) 65 - 99 mg/dL   Comment 1 Notify RN   Glucose, capillary     Status: None   Collection Time: 04/14/15 12:17 PM  Result Value Ref Range   Glucose-Capillary 92 65 - 99 mg/dL  Culture, expectorated sputum-assessment  Status: None   Collection Time: 04/14/15  1:29 PM  Result Value Ref Range   Specimen Description SPUTUM    Special Requests Normal    Sputum evaluation      MICROSCOPIC FINDINGS SUGGEST THAT THIS SPECIMEN IS NOT REPRESENTATIVE OF LOWER RESPIRATORY SECRETIONS. PLEASE RECOLLECT. RESULT CALLED TO, READ BACK BY AND VERIFIED WITH: Melissa Montane 04/14/15 @ 1434 M VESTAL    Report Status 04/14/2015 FINAL   Glucose, capillary     Status: Abnormal   Collection Time: 04/14/15  4:57 PM  Result Value Ref Range   Glucose-Capillary 116 (H) 65 - 99 mg/dL  Glucose, capillary     Status: Abnormal   Collection Time: 04/14/15  9:21 PM  Result Value Ref Range   Glucose-Capillary 192 (H) 65 - 99 mg/dL  Glucose, capillary     Status: Abnormal   Collection Time: 04/15/15  6:22 AM  Result Value Ref Range   Glucose-Capillary 139 (H) 65 - 99 mg/dL   Comment 1 Notify RN      HEENT: normal Cardio: RRR and no murmur Resp: CTA except expiratory wheezes,  unlabored GI: BS  positive and NT, ND Extremity:  No Edema Skin:   Intact Neuro: Alert/Oriented, Abnormal Sensory reduced LT, proprio RUE, Abnormal Motor 2- R delt bi tri grip, 4/5 RLE, 5/5 on left side, Abnormal FMC Ataxic/ dec FMC, Inattention and Other R HH Musc/Skel:  Other no pain with UE or LE ROM Gen NAD   Assessment/Plan: 1. Functional deficits secondary to Right hemiparesis, Right homonymous hemianopsia, cognitive deficits related to CVA which require 3+ hours per day of interdisciplinary therapy in a comprehensive inpatient rehab setting. Physiatrist is providing close team supervision and 24 hour management of active medical problems listed below. Physiatrist and rehab team continue to assess barriers to discharge/monitor patient progress toward functional and medical goals. FIM: Function - Bathing Position: Shower Body parts bathed by patient: Right arm, Left arm, Chest, Abdomen, Front perineal area, Buttocks, Right upper leg, Left upper leg, Right lower leg, Left lower leg, Back Body parts bathed by helper: Back Assist Level: Touching or steadying assistance(Pt > 75%)  Function- Upper Body Dressing/Undressing What is the patient wearing?: Bra, Pull over shirt/dress Bra - Perfomed by patient: Thread/unthread right bra strap, Thread/unthread left bra strap Bra - Perfomed by helper: Hook/unhook bra (pull down sports bra) Pull over shirt/dress - Perfomed by patient: Thread/unthread right sleeve, Thread/unthread left sleeve, Put head through opening, Pull shirt over trunk Pull over shirt/dress - Perfomed by helper: Thread/unthread right sleeve, Thread/unthread left sleeve, Put head through opening, Pull shirt over trunk Assist Level: Set up Set up : To obtain clothing/put away Function - Lower Body Dressing/Undressing What is the patient wearing?: Pants, Socks, Shoes, Underwear (pull up briefs as underwear) Position: Other (comment) (sitting on BSC) Underwear - Performed by patient:  Thread/unthread right underwear leg, Thread/unthread left underwear leg, Pull underwear up/down Underwear - Performed by helper: Pull underwear up/down Pants- Performed by patient: Thread/unthread right pants leg, Thread/unthread left pants leg, Pull pants up/down, Fasten/unfasten pants Pants- Performed by helper: Pull pants up/down Non-skid slipper socks- Performed by helper: Don/doff right sock, Don/doff left sock Socks - Performed by patient: Don/doff right sock, Don/doff left sock Socks - Performed by helper: Don/doff right sock, Don/doff left sock (assisted with part of task) Shoes - Performed by patient: Don/doff right shoe, Don/doff left shoe, Fasten right, Fasten left (used elastic laces provided) Shoes - Performed by helper: Fasten right, Fasten left Assist for  footwear: Setup Assist for lower body dressing: Touching or steadying assistance (Pt > 75%)  Function - Toileting Toileting steps completed by patient: Adjust clothing prior to toileting, Performs perineal hygiene, Adjust clothing after toileting Toileting steps completed by helper: Adjust clothing after toileting Toileting Assistive Devices: Grab bar or rail Assist level: Supervision or verbal cues  Function - Toilet Transfers Toilet transfer assistive device: Elevated toilet seat/BSC over toilet Assist level to toilet: Supervision or verbal cues Assist level from toilet: Supervision or verbal cues  Function - Chair/bed transfer Chair/bed transfer method: Ambulatory Chair/bed transfer assist level: Touching or steadying assistance (Pt > 75%) Chair/bed transfer assistive device: Walker, Armrests Chair/bed transfer details: Manual facilitation for weight shifting, Verbal cues for precautions/safety  Function - Locomotion: Wheelchair Will patient use wheelchair at discharge?:  (tbd) Type: Manual Max wheelchair distance: 68' Assist Level: Touching or steadying assistance (Pt > 75%) Assist Level: Dependent (Pt equals  0%) Wheel 150 feet activity did not occur: Safety/medical concerns Assist Level: Dependent (Pt equals 0%) Turns around,maneuvers to table,bed, and toilet,negotiates 3% grade,maneuvers on rugs and over doorsills: No Function - Locomotion: Ambulation Assistive device: No device Max distance: 100 Assist level: Touching or steadying assistance (Pt > 75%) Walk 10 feet activity did not occur: Safety/medical concerns Assist level: Touching or steadying assistance (Pt > 75%) Walk 50 feet with 2 turns activity did not occur: Safety/medical concerns Assist level: Touching or steadying assistance (Pt > 75%) Walk 150 feet activity did not occur: Safety/medical concerns Assist level: No help, No cues, assistive device, takes more than a reasonable amount of time Walk 10 feet on uneven surfaces activity did not occur: Safety/medical concerns Assist level: No help, No cues, assistive device, takes more than a reasonable amount of time  Function - Comprehension Comprehension: Auditory Comprehension assist level: Understands basic 90% of the time/cues < 10% of the time  Function - Expression Expression: Verbal Expression assist level: Expresses basic 75 - 89% of the time/requires cueing 10 - 24% of the time. Needs helper to occlude trach/needs to repeat words.  Function - Social Interaction Social Interaction assist level: Interacts appropriately 90% of the time - Needs monitoring or encouragement for participation or interaction.  Function - Problem Solving Problem solving assist level: Solves basic 50 - 74% of the time/requires cueing 25 - 49% of the time  Function - Memory Memory assist level: Recognizes or recalls 50 - 74% of the time/requires cueing 25 - 49% of the time Patient normally able to recall (first 3 days only): That he or she is in a hospital, Current season  Medical Problem List and Plan: 1.  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  DVT Prophylaxis/Anticoagulation: Pharmaceutical: Lovenox 3. Pain Management:  Tylenol prn 4. Mood: LCSW to follow for evaluation and support.   5. Neuropsych: This patient is not capable of making decisions onher own behalf. 6. Skin/Wound Care: Routine pressure relief measures. Maintain adequate nutrition and hydration status 7. Fluids/Electrolytes/Nutrition:  Monitor I/O. Check lytes in am.   8. DM type 2:   Home regimen included Invokana 300 mg, Metformin 1000 mg bid, Levemir 40 mg bid and Novolog 20-10-20 ac. Monitor BS ac/hs Currently on levimir 40U BID and Novalog 8 U with meals, CBGs ok 9. HTN: Monitor BP every 8 hours.  Check orthostatic BP in am and slowly resume medications as indicated. Continue coreg bid.   HR 77, BP controlled at 158/66 today 10.  Diabetic Neuropathy:  Elavil was dc due to concerns of SE--Continue lyrica. and cymbalta, no complaints of neuropathic pain today 11. Leucocytosis: WBC on upward trend. Will monitor for fevers and other signs of infection.   12. Hypokalemia:  Will supplement for now and recheck in am.  Likely dilutional due to IVF which were d/c today.    14. OSA: Does not tolerate CPAP 15. Intermittent diarrhea x few months:  due to metformin 16. COPD with chronic bronchitis/cough:lung exam normal today , WBC nl on 12/7, afeb, no on pulmicort   LOS (Days) 10 A FACE TO FACE EVALUATION WAS PERFORMED  Casey Harrell E 04/15/2015, 7:57 AM

## 2015-04-15 NOTE — Progress Notes (Signed)
Physical Therapy Weekly Progress Note  Patient Details  Name: Casey Harrell MRN: [id number] Date of Birth: 1934/06/26  Beginning of progress report period: April 06, 2015 End of progress report period: April 15, 2015  Today's Date: 04/15/2015 PT Individual Time: 1300-1400 PT Individual Time Calculation (min): 60 min   Patient has met 5 of 5 short term goals.  Pt's activity tolerance, sit/stand balance, and functional mobility have all improved over this past week, but pt continues to have significant fatigue with minimal exertion and dizziness/vertigo symptoms during sit to supine and when flexing trunk forward that limit her safety awareness. Pt's BP continues to be labile, and limit pt's safety with transitional/positional changes.   Patient continues to demonstrate the following deficits: limited activity tolerance, impaired standing balance, R side hemiplegia (arm weaker than leg), difficulty with supine to sit transfer, difficulty on stairs and therefore will continue to benefit from skilled PT intervention to enhance overall performance with activity tolerance, balance, functional use of  right upper extremity and right lower extremity, awareness and coordination.  Patient progressing toward long term goals..  Continue plan of care.  PT Short Term Goals Week 1:  PT Short Term Goal 1 (Week 1): Patient will perform bed mobility with min A.  PT Short Term Goal 1 - Progress (Week 1): Met PT Short Term Goal 2 (Week 1): Patient will perform bed <> wheelchair transfers with min A.  PT Short Term Goal 2 - Progress (Week 1): Met PT Short Term Goal 3 (Week 1): Patient will ambulate x 10 ft with assist of one person.  PT Short Term Goal 3 - Progress (Week 1): Met PT Short Term Goal 4 (Week 1): Patient will perform dynamic standing balance with mod A x 3 min.  PT Short Term Goal 4 - Progress (Week 1): Met PT Short Term Goal 5 (Week 1): Patient will negotiate up/down 3 stairs using L rail with  mod A.  PT Short Term Goal 5 - Progress (Week 1): Met Week 2:  PT Short Term Goal 1 (Week 2): STGs = LTGs due to ELOS  Skilled Therapeutic Interventions/Progress Updates:    Pt received up in w/c with husband present entire session. Pt agreeable to weekly reassessment of goals. Therapeutic Activity - Pt demonstrates stand-step transfer w/c to bed with RW and close SBA, all sit to supine transfer mod I, rolling in bed mod I with increased time, and R side lie to sit transfer req min A. Pt requests to toilet prior to leaving the room - please see function tab for details. Pt grossly req steadying assist to ambulate in/out of bathroom and transfer to [facility] over toilet with RW, while self managing pants and hygiene. Neuromuscular Reeducation - PT instructs pt in repeated sit to/from supine transfer x 3 reps: 1st trial 2/10 dizziness on sit to supine and no dizziness sitting up; 2nd trial 1/10 dizziness on sit to supine and no dizziness sitting up; 3rd trial 1/10 dizziness on sit to supine and 1/10 dizziness on supine to sit - all episodes of dizziness lasting less than 10 seconds. PT instructs pt in habituation exercise of pitching head towards L knee in sit x 3 reps: first trial 10/10 dizziness and pt becomes nauseous; 2nd trial 2/10 dizziness; 3rd trial 2/10 dizziness. PT instructs pt in eye saccade movements R/L, up/down, and diagonals x 10 reps each. Gait Training - PT instructs pt in ambulation with RW x 115' req close SBA - no LOB req assist  to correct. Pt ended in bed to rest. Continue per PT POC.   Therapy Documentation Precautions:  Precautions Precautions: Fall Restrictions Weight Bearing Restrictions: No Vital Signs: see flow sheet Pain: Pain Assessment Pain Assessment: No/denies pain   See Function Navigator for Current Functional Status.  Therapy/Group: Individual Therapy  Gloria Lambertson M 04/15/2015, 1:05 PM

## 2015-04-15 NOTE — Patient Care Conference (Signed)
Inpatient RehabilitationTeam Conference and Plan of Care Update Date: 04/14/2015   Time: 2:20 PM    Patient Name: Casey Harrell      Medical Record Number: [id number]  Date of Birth: 1934-05-15 Sex: Female         Room/Bed: 4W17C/4W17C-01 Payor Info: Payor: [employer]ICARE / Plan: BCBS MEDICARE / Product Type: *No Product type* /    Admitting Diagnosis: L CVA  Admit Date/Time:  04/05/2015  5:50 PM Admission Comments: No comment available   Primary Diagnosis:  Hemiplegia, unspecified affecting right dominant side (HCC) Principal Problem: Hemiplegia, unspecified affecting right dominant side [facility])  Patient Active Problem List   Diagnosis Date Noted  . Hemiplegia, unspecified affecting right dominant side (HCC) 04/08/2015  . Embolic stroke involving posterior cerebral artery (HCC) 04/05/2015  . Orthostatic hypotension   . Hypertensive urgency 04/01/2015  . Fever in adult 04/01/2015  . Acute encephalopathy 04/01/2015  . Confusion   . CVA (cerebral infarction)   . Posterior circulation stroke (HCC)   . Acute ischemic stroke (HCC) 03/31/2015  . Stroke (HCC) 03/31/2015  . CKD (chronic kidney disease) stage 3, GFR 30-59 ml/min 04/17/2014  . Gastroenteritis, non-infectious 04/11/2014  . Nausea vomiting and diarrhea 04/10/2014  . Hypokalemia 04/10/2014  . Dehydration 04/09/2014  . Bronchitis with airway obstruction (HCC) 02/03/2013  . Leukocytosis 02/03/2013  . Fever 02/03/2013  . Acute respiratory failure with hypoxia (HCC) 02/03/2013  . Depression 02/03/2013  . Hyponatremia 02/03/2013  . Obesity (BMI 30-39.9) 01/10/2013  . PVD (peripheral vascular disease)- S/P RCE 06/18/09 12/13/2012  . Sleep apnea- C-pap intol 12/13/2012  . Low risk cardiac nuclear stress test- 2009 and Feb 2011 12/13/2012  . Mild persistent asthma 11/29/2011  . Chronic cough 06/05/2011  . Diabetic neuropathy (HCC) 09/02/2009  . Dyslipidemia 07/13/2009  . Hypothyroidism 11/12/2008  . Type 2  diabetes mellitus, uncontrolled (HCC) 07/15/2008  . Essential hypertension 07/15/2008  . GERD 07/15/2008  . DIVERTICULOSIS, COLON 07/15/2008    Expected Discharge Date: Expected Discharge Date: 04/21/15  Team Members Present: Physician leading conference: Dr. Claudette Laws Social Worker Present: Dossie Der, LCSW Nurse Present: Carmie End, RN PT Present: Alyson Reedy, Conchita Paris, PT OT Present: Rosalio Loud, Marye Round, OT SLP Present: Jackalyn Lombard, SLP PPS Coordinator present : Tora Duck, RN, CRRN     Current Status/Progress Goal Weekly Team Focus  Medical   Left temporal-parietal-occipital CVA's with right hemiparesis, confusion, visual loss  Safe home discharge  Improve K+, CBGs, improve cough   Bowel/Bladder   occasional incontinent episode after coughing spell. LBM 12-5. continent  mod I  continue with plan of care .   Swallow/Nutrition/ Hydration     na        ADL's   Supervision with transfers, Improved RUE gross functioning and grip strength (limited FMC), overall min assist with dressing, steadying assist duirng dynamic standing.  Overall supervision (problem-solving, attention deficits), Min assist with LB dressing  RUE NMR, improved awareness, improved dynamic standing balance, family education   Mobility   min A overall  overall supervision, but min A on stairs  R side NMR, standing balance, improving activity tolerance, safety with transfers   Communication   min assist  supervision   higher level word finding   Safety/Cognition/ Behavioral Observations  higher level memory deficits  supervision   continue to address recall of daily information   Pain   none         Skin   buttocks remains  red from MASD. barrieer cream applied rash to back healing with sarna lotion. left thigh rash healed  no new breakdown  educate family on skin care       *See Care Plan and progress notes for long and short-term goals.  Barriers to  Discharge: Safety awareness, cognitive status, labs values    Possible Resolutions to Barriers:  Cont pt and family educations, adjust medications as necessary    Discharge Planning/Teaching Needs:  Husband here daily participating in therapies with pt-pt making good progress.      Team Discussion:  Slowly improving, will reach supervision/min level by discharge. Coreg started for BP, cough being treated with respiratory treatments. Safety and attention issues-husband has been here and participated in therapies with pt daily.  Revisions to Treatment Plan:  None   Continued Need for Acute Rehabilitation Level of Care: The patient requires daily medical management by a physician with specialized training in physical medicine and rehabilitation for the following conditions: Daily direction of a multidisciplinary physical rehabilitation program to ensure safe treatment while eliciting the highest outcome that is of practical value to the patient.: Yes Daily medical management of patient stability for increased activity during participation in an intensive rehabilitation regime.: Yes Daily analysis of laboratory values and/or radiology reports with any subsequent need for medication adjustment of medical intervention for : Neurological problems;Other  Leighanna Kirn, Lemar Livings 04/15/2015, 8:16 AM

## 2015-04-15 NOTE — Progress Notes (Signed)
Speech Language Pathology Daily Session Note  Patient Details  Name: Casey Harrell F Dils MRN: [id number] Date of Birth: 17-Nov-1934  Today's Date: 04/15/2015 SLP Individual Time: 0933-1000 SLP Individual Time Calculation (min): 27 min  Short Term Goals: Week 2: SLP Short Term Goal 1 (Week 2): Patient will utilize word-finding strategies with supervision cues at the sentence level to express basic wants/needs over three consecutive sessions.  SLP Short Term Goal 2 (Week 2): Patient will complete generative naming tasks with Min A multimodal cues.  SLP Short Term Goal 3 (Week 2): Patient will follow mildly complex commands (2-3 step) with extra time and supervision cues.  SLP Short Term Goal 4 (Week 2): Patient will complete mildly complex and familiar problem solving tasks with supervision cues.  SLP Short Term Goal 5 (Week 2): Patient will self-monitor and correct errors during functional tasks with supervision cues.  SLP Short Term Goal 6 (Week 2): Patient will utilize external memory aids to recall new, daily information with Min A question and verbal cues.   Skilled Therapeutic Interventions:  Pt was seen for skilled ST targeting cognitive goals.  SLP facilitated the session with a basic, catalog shopping task targeting functional problem solving.  Pt made a list of family members for whom she wished to purchase gifts with supervision question cues.  She located items from a catalog for each member of her family with supervision cues for explanation of reasoning behind each gift.  She completed functional math calculations to generate a total cost of the abovementioned gifts with min assist verbal cues for organization when entering numbers into a calculator.  Pt was left in wheelchair with husband at bedside.  Continue per current plan of care.    Function:  Eating Eating                 Cognition Comprehension Comprehension assist level: Understands basic 90% of the time/cues < 10% of the  time  Expression   Expression assist level: Expresses basic 75 - 89% of the time/requires cueing 10 - 24% of the time. Needs helper to occlude trach/needs to repeat words.  Social Interaction Social Interaction assist level: Interacts appropriately 90% of the time - Needs monitoring or encouragement for participation or interaction.  Problem Solving Problem solving assist level: Solves basic 50 - 74% of the time/requires cueing 25 - 49% of the time  Memory Memory assist level: Recognizes or recalls 50 - 74% of the time/requires cueing 25 - 49% of the time    Pain Pain Assessment Pain Assessment: No/denies pain  Therapy/Group: Individual Therapy  Abdulkarim Eberlin, Melanee SpryNicole L 04/15/2015, 4:17 PM

## 2015-04-15 NOTE — Progress Notes (Signed)
Social Work Patient ID: Casey Harrell, female   DOB: January 20, 1935, 80 y.o.   MRN: [id number] Met with pt and husband to inform them of their 66 dollar co-pay for OP therapies, plan is home health since no co-pays for this service. Have no preference of agency. Will work on discharge plans.

## 2015-04-15 NOTE — Progress Notes (Signed)
Occupational Therapy Session Note  Patient Details  Name: Casey Harrell MRN: [id number] Date of Birth: Oct 10, 1934  Today's Date: 04/15/2015 OT Individual Time: 1030-1145 OT Individual Time Calculation (min): 75 min    Short Term Goals: Week 2:  OT Short Term Goal 1 (Week 2): STG=LTG d/t short remaining LOS.  Skilled Therapeutic Interventions/Progress Updates: ADL-retraining (60 min) with focus on transfers, improved functional use of RUE/, FMC of right hand, AE to improve grip (coban), improved awareness, and family education (husband present) on providing steadying assist during dynamic standing.   Pt completes all BADL this session with setup and supervision to sequence and steadying assist while standing to wash buttocks and to don briefs and pants.   Pt demo's decreased impulsivity this session as evidenced by alerting therapist to assist each time she stands.   Pt requires supervision to remember to lock/unlock brakes and only min assist to problem-solve while showering.  Husband provided setup for grooming and steadying assist 1 time during this session.  Therapeutic exercises to focus on improved right hand grip/pinch strength.   Pt requires close supervision to inhibit [facility] flex[facility] synergies while manipulating putty and performing exercises.        Therapy Documentation Precautions:  Precautions Precautions: Fall Restrictions Weight Bearing Restrictions: No  Pain: Pain Assessment Pain Assessment: No/denies pain  See Function Navigator for Current Functional Status.   Therapy/Group: Individual Therapy  Salvadore Valvano 04/15/2015, 12:51 PM

## 2015-04-15 NOTE — Progress Notes (Signed)
Social Work Patient ID: Casey Harrell, female   DOB: [birth year], 80 y.o.   MRN: [id number] Met with pt and husband to inform team conference progression toward her goals and discharge still 12/14. She reports: " I am ready to go home." husband has been here daily and participates in therapies with pt. He feels comfortable with wife's care. Discussing home health versus OP therapies, will look into co-pay for each. Will get back with husband with results.

## 2015-04-16 ENCOUNTER — Inpatient Hospital Stay (HOSPITAL_COMMUNITY): Payer: Medicare Other | Admitting: Physical Therapy

## 2015-04-16 ENCOUNTER — Inpatient Hospital Stay (HOSPITAL_COMMUNITY): Payer: Medicare Other | Admitting: Speech Pathology

## 2015-04-16 ENCOUNTER — Inpatient Hospital Stay (HOSPITAL_COMMUNITY): Payer: Medicare Other

## 2015-04-16 LAB — GLUCOSE, CAPILLARY
Glucose-Capillary: 126 mg/dL — ABNORMAL HIGH (ref 65–99)
Glucose-Capillary: 107 mg/dL — ABNORMAL HIGH (ref 65–99)

## 2015-04-16 MED ORDER — DIPHENHYDRAMINE HCL 25 MG PO CAPS
25.0000 mg | ORAL_CAPSULE | Freq: Every day | ORAL | Status: DC
  Administered 2015-04-16 – 2015-04-19 (×4): 25 mg via ORAL
  Filled 2015-04-16 (×4): qty 1

## 2015-04-16 NOTE — Progress Notes (Signed)
Occupational Therapy Session Note  Patient Details  Name: Casey Harrell MRN: [id number] Date of Birth: 10/29/1934  Today's Date: 04/16/2015 OT Individual Time: 1000-1130 OT Individual Time Calculation (min): 90 min   Short Term Goals: Week 2:  OT Short Term Goal 1 (Week 2): STG=LTG d/t short remaining LOS.  Skilled Therapeutic Interventions/Progress Updates: ADL-retraining at shower level with focus on improved awareness, adapted dressing skills, and dynamic standing balance.   Pt received on toilet with RN attending to meds.  Pt completes toileting for BM, unassisted for clothing management and hygiene, and ambulates to shower with steadying hand held assist.   Pt bathes unassisted with supervision for safety and min vc to organize supplies.   Pt recovers to w/c for safety after bathing and dresses at sink after brief rest break.   Pt able to recall strategy to dress right lower leg first during this session without cues.   Pt stands to pull up pants with standby assist required for safety d/t mild instability while adjusting waist band of pants using her right hand.   Pt grooms with setup assist and is escorted to SLP clinic for therapeutic activity using [employer].    Pt completes 3 practice test patterns as balance and attention activity with only 3 inner rings activated during practice   Pt requires rest breaks and re-ed on intent of task but continues testing with good effort.  Pt demo's moderate delay at right UE d/t hemiparesis with 2 second delayed reaction compared to left hand.    Pt escorted back to her room at end of session and left visiting her sister-in-law with all needs within reach.    Therapy Documentation Precautions:  Precautions Precautions: Fall Restrictions Weight Bearing Restrictions: No   Pain: No/denies pain    See Function Navigator for Current Functional Status.   Therapy/Group: Individual Therapy  [name] 04/16/2015, 1:03 PM

## 2015-04-16 NOTE — Progress Notes (Signed)
Speech Language Pathology Daily Session Note  Patient Details  Name: Casey Harrell MRN: [id number] Date of Birth: [birth year]  Today's Date: 04/16/2015 SLP Individual Time: 1400-1500 SLP Individual Time Calculation (min): 60 min  Short Term Goals: Week 2: SLP Short Term Goal 1 (Week 2): Patient will utilize word-finding strategies with supervision cues at the sentence level to express basic wants/needs over three consecutive sessions.  SLP Short Term Goal 2 (Week 2): Patient will complete generative naming tasks with Min A multimodal cues.  SLP Short Term Goal 3 (Week 2): Patient will follow mildly complex commands (2-3 step) with extra time and supervision cues.  SLP Short Term Goal 4 (Week 2): Patient will complete mildly complex and familiar problem solving tasks with supervision cues.  SLP Short Term Goal 5 (Week 2): Patient will self-monitor and correct errors during functional tasks with supervision cues.  SLP Short Term Goal 6 (Week 2): Patient will utilize external memory aids to recall new, daily information with Min A question and verbal cues.   Skilled Therapeutic Interventions: Skilled treatment session focused on cognitive goals.  SLP facilitated session by providing Min A verbal and visual cues for functional problem solving with a basic money management task and Mod A verbal and visual cues for functional problem solving and organization during a mildly complex money management task. Patient verbalized at the sentence level throughout the session with extra time and supervision verbal cues. Patient left sitting EOB with husband present. Continue with current plan of care.    Function:  Cognition Comprehension Comprehension assist level: Understands basic 90% of the time/cues < 10% of the time  Expression   Expression assist level: Expresses basic 90% of the time/requires cueing < 10% of the time.  Social Interaction Social Interaction assist level: Interacts appropriately 90% of  the time - Needs monitoring or encouragement for participation or interaction.  Problem Solving Problem solving assist level: Solves basic 75 - 89% of the time/requires cueing 10 - 24% of the time  Memory Memory assist level: Recognizes or recalls 75 - 89% of the time/requires cueing 10 - 24% of the time    Pain Pain Assessment Pain Assessment: No/denies pain  Therapy/Group: Individual Therapy  Jazzalynn Rhudy 04/16/2015, 3:52 PM

## 2015-04-16 NOTE — Progress Notes (Signed)
Physical Therapy Session Note  Patient Details  Name: Casey Harrell MRN: [id number] Date of Birth: Jan 02, 1935  Today's Date: 04/16/2015 PT Individual Time: 1505-1605 PT Individual Time Calculation (min): 60 min   Short Term Goals: Week 2:  PT Short Term Goal 1 (Week 2): STGs = LTGs due to ELOS  Skilled Therapeutic Interventions/Progress Updates:    Pt received up in w/c - reporting fatigue after ST treatment, but willing to work with PT. Therapeutic Activity - Pt agrees to toilet prior to leaving room - ambulates in/out of bathroom with RW req SBA, manages hygiene herself, but req assist to change diaper and with pulling pants up. Pt washes hands in stand at sink with SBA. PT instructs pt in fine motor activity with B hands - making a bead necklace - required cues to hold string with L (strong) hand and use R hand for pinching bead and placing onto string. Gait Training - PT instructs pt in ambulation with RW x 155' req SBA for safety - decreased ankle DF with shuffling of feet, but no LOB. PT instructs pt in ascending/descending 6" steps with B hands on 1 rail req min A x 2 sets (once with PT, once with husband for initiation of hands on family training). PT instructs pt in ambulating 10' over compliant floor mat req CGA-SBA for safety. Pt ended on toilet having BM - agrees to pull call bell for nurse tech and nurse tech notified - husband in room. Pt continues to make slow but steady functional gains and vestibular symptoms appear to be improving. Continue per PT POC.   Therapy Documentation Precautions:  Precautions Precautions: Fall Restrictions Weight Bearing Restrictions: No Vital Signs: Therapy Vitals Temp: 97.9 F (36.6 C) Temp Source: Oral Pulse Rate: 63 Resp: 18 BP: (!) 138/58 mmHg Patient Position (if appropriate): Sitting Oxygen Therapy SpO2: 96 % O2 Device: Not Delivered Pain: Pain Assessment Pain Assessment: No/denies pain  See Function Navigator for Current Functional  Status.   Therapy/Group: Individual Therapy  Dushaun Okey M 04/16/2015, 3:10 PM

## 2015-04-16 NOTE — Progress Notes (Signed)
Subjective/Complaints: No issues except for cough  Review of systems no breathing problems no bowel or bladder issues, no joint or muscle pains.  Objective: Vital Signs: Blood pressure 146/62, pulse 70, temperature 97.7 F (36.5 C), temperature source Oral, resp. rate 16, height 5\' 1"  (1.549 m), weight 67.5 kg (148 lb 13 oz), SpO2 94 %. No results found. Results for orders placed or performed during the hospital encounter of 04/05/15 (from the past 72 hour(s))  Glucose, capillary     Status: Abnormal   Collection Time: 04/13/15 12:20 PM  Result Value Ref Range   Glucose-Capillary 181 (H) 65 - 99 mg/dL   Comment 1 Notify RN   Culture, expectorated sputum-assessment     Status: None   Collection Time: 04/13/15  3:38 PM  Result Value Ref Range   Specimen Description SPUTUM    Special Requests NONE    Sputum evaluation      MICROSCOPIC FINDINGS SUGGEST THAT THIS SPECIMEN IS NOT REPRESENTATIVE OF LOWER RESPIRATORY SECRETIONS. PLEASE RECOLLECT. NOTIFIED A JOYCE RN 1800 04/13/15 A BROWNING    Report Status 04/13/2015 FINAL   Glucose, capillary     Status: Abnormal   Collection Time: 04/13/15  5:16 PM  Result Value Ref Range   Glucose-Capillary 164 (H) 65 - 99 mg/dL   Comment 1 Notify RN   Glucose, capillary     Status: Abnormal   Collection Time: 04/13/15  9:26 PM  Result Value Ref Range   Glucose-Capillary 174 (H) 65 - 99 mg/dL   Comment 1 Notify RN   CBC     Status: Abnormal   Collection Time: 04/14/15  4:50 AM  Result Value Ref Range   WBC 8.2 4.0 - 10.5 K/uL    Comment: WHITE COUNT CONFIRMED ON SMEAR REPEATED TO VERIFY    RBC 4.93 3.87 - 5.11 MIL/uL   Hemoglobin 12.9 12.0 - 15.0 g/dL   HCT 40.940.0 81.136.0 - 91.446.0 %   MCV 81.1 78.0 - 100.0 fL   MCH 26.2 26.0 - 34.0 pg   MCHC 32.3 30.0 - 36.0 g/dL   RDW 78.216.9 (H) 95.611.5 - 21.315.5 %   Platelets  150 - 400 K/uL    PLATELET CLUMPS NOTED ON SMEAR, COUNT APPEARS ADEQUATE  Glucose, capillary     Status: Abnormal   Collection Time:  04/14/15  7:06 AM  Result Value Ref Range   Glucose-Capillary 138 (H) 65 - 99 mg/dL   Comment 1 Notify RN   Glucose, capillary     Status: None   Collection Time: 04/14/15 12:17 PM  Result Value Ref Range   Glucose-Capillary 92 65 - 99 mg/dL  Culture, expectorated sputum-assessment     Status: None   Collection Time: 04/14/15  1:29 PM  Result Value Ref Range   Specimen Description SPUTUM    Special Requests Normal    Sputum evaluation      MICROSCOPIC FINDINGS SUGGEST THAT THIS SPECIMEN IS NOT REPRESENTATIVE OF LOWER RESPIRATORY SECRETIONS. PLEASE RECOLLECT. RESULT CALLED TO, READ BACK BY AND VERIFIED WITH: Melissa MontaneM BARBOUR 04/14/15 @ 1434 M VESTAL    Report Status 04/14/2015 FINAL   Glucose, capillary     Status: Abnormal   Collection Time: 04/14/15  4:57 PM  Result Value Ref Range   Glucose-Capillary 116 (H) 65 - 99 mg/dL  Glucose, capillary     Status: Abnormal   Collection Time: 04/14/15  9:21 PM  Result Value Ref Range   Glucose-Capillary 192 (H) 65 - 99 mg/dL  Glucose,  capillary     Status: Abnormal   Collection Time: 04/15/15  6:22 AM  Result Value Ref Range   Glucose-Capillary 139 (H) 65 - 99 mg/dL   Comment 1 Notify RN   Glucose, capillary     Status: None   Collection Time: 04/15/15 11:36 AM  Result Value Ref Range   Glucose-Capillary 98 65 - 99 mg/dL  Glucose, capillary     Status: Abnormal   Collection Time: 04/15/15  4:29 PM  Result Value Ref Range   Glucose-Capillary 157 (H) 65 - 99 mg/dL  Glucose, capillary     Status: Abnormal   Collection Time: 04/15/15  8:53 PM  Result Value Ref Range   Glucose-Capillary 173 (H) 65 - 99 mg/dL   Comment 1 Notify RN   Glucose, capillary     Status: Abnormal   Collection Time: 04/16/15  6:36 AM  Result Value Ref Range   Glucose-Capillary 126 (H) 65 - 99 mg/dL   Comment 1 Notify RN      HEENT: normal Cardio: RRR and no murmur Resp: CTA except expiratory wheezes,  unlabored GI: BS positive and NT, ND Extremity:  No  Edema Skin:   Intact Neuro: Alert/Oriented, Abnormal Sensory reduced LT, proprio RUE, Abnormal Motor 2- R delt bi tri grip, 4/5 RLE, 5/5 on left side, Abnormal FMC Ataxic/ dec FMC, Inattention and Other R HH Musc/Skel:  Other no pain with UE or LE ROM Gen NAD   Assessment/Plan: 1. Functional deficits secondary to Right hemiparesis, Right homonymous hemianopsia, cognitive deficits related to CVA which require 3+ hours per day of interdisciplinary therapy in a comprehensive inpatient rehab setting. Physiatrist is providing close team supervision and 24 hour management of active medical problems listed below. Physiatrist and rehab team continue to assess barriers to discharge/monitor patient progress toward functional and medical goals. FIM: Function - Bathing Position: Shower Body parts bathed by patient: Right arm, Chest, Left arm, Abdomen, Front perineal area, Buttocks, Right upper leg, Left upper leg, Right lower leg, Left lower leg Body parts bathed by helper: Back Assist Level: Touching or steadying assistance(Pt > 75%)  Function- Upper Body Dressing/Undressing What is the patient wearing?: Bra, Pull over shirt/dress Bra - Perfomed by patient: Thread/unthread right bra strap, Thread/unthread left bra strap Bra - Perfomed by helper: Hook/unhook bra (pull down sports bra) Pull over shirt/dress - Perfomed by patient: Thread/unthread right sleeve, Thread/unthread left sleeve, Put head through opening, Pull shirt over trunk Pull over shirt/dress - Perfomed by helper: Thread/unthread right sleeve, Thread/unthread left sleeve, Put head through opening, Pull shirt over trunk Assist Level: Set up Set up : To obtain clothing/put away Function - Lower Body Dressing/Undressing What is the patient wearing?: Pants, Underwear, Shoes, Non-skid slipper socks Position: Wheelchair/chair at sink Underwear - Performed by patient: Thread/unthread right underwear leg, Thread/unthread left underwear leg,  Pull underwear up/down Underwear - Performed by helper: Pull underwear up/down Pants- Performed by patient: Thread/unthread right pants leg, Thread/unthread left pants leg, Pull pants up/down, Fasten/unfasten pants Pants- Performed by helper: Pull pants up/down Non-skid slipper socks- Performed by patient: Don/doff right sock, Don/doff left sock Non-skid slipper socks- Performed by helper: Don/doff right sock, Don/doff left sock Socks - Performed by patient: Don/doff right sock, Don/doff left sock Socks - Performed by helper: Don/doff right sock, Don/doff left sock (assisted with part of task) Shoes - Performed by patient: Don/doff right shoe, Don/doff left shoe, Fasten right, Fasten left (using elastic shoe laces) Shoes - Performed by helper: Fasten right, [employer]  left Assist for footwear: Setup Assist for lower body dressing: Supervision or verbal cues  Function - Toileting Toileting steps completed by patient: Adjust clothing prior to toileting, Performs perineal hygiene, Adjust clothing after toileting Toileting steps completed by helper: Adjust clothing after toileting Toileting Assistive Devices: Grab bar or rail Assist level: Supervision or verbal cues  Function - Toilet Transfers Toilet transfer assistive device: Elevated toilet seat/BSC over toilet Assist level to toilet: Supervision or verbal cues Assist level from toilet: Supervision or verbal cues  Function - Chair/bed transfer Chair/bed transfer method: Ambulatory Chair/bed transfer assist level: Touching or steadying assistance (Pt > 75%) Chair/bed transfer assistive device: Walker, Armrests Chair/bed transfer details: Manual facilitation for weight shifting, Verbal cues for precautions/safety  Function - Locomotion: Wheelchair Will patient use wheelchair at discharge?:  (tbd) Type: Manual Max wheelchair distance: 60' Assist Level: Touching or steadying assistance (Pt > 75%) Assist Level: Dependent (Pt equals  0%) Wheel 150 feet activity did not occur: Safety/medical concerns Assist Level: Dependent (Pt equals 0%) Turns around,maneuvers to table,bed, and toilet,negotiates 3% grade,maneuvers on rugs and over doorsills: No Function - Locomotion: Ambulation Assistive device: No device Max distance: 100 Assist level: Touching or steadying assistance (Pt > 75%) Walk 10 feet activity did not occur: Safety/medical concerns Assist level: Touching or steadying assistance (Pt > 75%) Walk 50 feet with 2 turns activity did not occur: Safety/medical concerns Assist level: Touching or steadying assistance (Pt > 75%) Walk 150 feet activity did not occur: Safety/medical concerns Assist level: No help, No cues, assistive device, takes more than a reasonable amount of time Walk 10 feet on uneven surfaces activity did not occur: Safety/medical concerns Assist level: No help, No cues, assistive device, takes more than a reasonable amount of time  Function - Comprehension Comprehension: Auditory Comprehension assist level: Understands basic 90% of the time/cues < 10% of the time  Function - Expression Expression: Verbal Expression assist level: Expresses basic 75 - 89% of the time/requires cueing 10 - 24% of the time. Needs helper to occlude trach/needs to repeat words.  Function - Social Interaction Social Interaction assist level: Interacts appropriately 90% of the time - Needs monitoring or encouragement for participation or interaction.  Function - Problem Solving Problem solving assist level: Solves basic 50 - 74% of the time/requires cueing 25 - 49% of the time  Function - Memory Memory assist level: Recognizes or recalls 50 - 74% of the time/requires cueing 25 - 49% of the time Patient normally able to recall (first 3 days only): That he or she is in a hospital, Current season  Medical Problem List and Plan: 1.  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  DVT Prophylaxis/Anticoagulation: Pharmaceutical: Lovenox 3. Pain Management:  Tylenol prn 4. Mood: LCSW to follow for evaluation and support.   5. Neuropsych: This patient is not capable of making decisions onher own behalf. 6. Skin/Wound Care: Routine pressure relief measures. Maintain adequate nutrition and hydration status 7. Fluids/Electrolytes/Nutrition:  Monitor I/O. Check lytes in am.   8. DM type 2:   Home regimen included Invokana 300 mg, Metformin 1000 mg bid, Levemir 40 mg bid and Novolog 20-10-20 ac. Monitor BS ac/hs Currently on levimir 40U BID and Novalog 8 U with meals, CBGs ok 9. HTN: Monitor BP every 8 hours.  Check orthostatic BP in am and slowly resume medications as indicated. Continue coreg bid.   HR 77, BP controlled at 146/62 today 10.  Diabetic Neuropathy:  Elavil was dc due to concerns of SE--Continue lyrica. and cymbalta, no complaints of neuropathic pain today 11. Leucocytosis: WBC on upward trend. Will monitor for fevers and other signs of infection.   12. Hypokalemia:  Will supplement for now and recheck in am.  Likely dilutional due to IVF which were d/c today.    14. OSA: Does not tolerate CPAP 15. Intermittent diarrhea x few months:  due to metformin 16. COPD with chronic bronchitis/cough:lung exam normal today , WBC nl on 12/7, afeb, increase pulmicort   LOS (Days) 11 A FACE TO FACE EVALUATION WAS PERFORMED  Rifka Ramey E 04/16/2015, 9:05 AM

## 2015-04-17 ENCOUNTER — Inpatient Hospital Stay (HOSPITAL_COMMUNITY): Payer: Medicare Other | Admitting: Physical Therapy

## 2015-04-17 LAB — EXPECTORATED SPUTUM ASSESSMENT W GRAM STAIN, RFLX TO RESP C: Special Requests: NORMAL

## 2015-04-17 MED ORDER — CLARITHROMYCIN 250 MG PO TABS
250.0000 mg | ORAL_TABLET | Freq: Two times a day (BID) | ORAL | Status: DC
  Administered 2015-04-17 – 2015-04-20 (×7): 250 mg via ORAL
  Filled 2015-04-17 (×9): qty 1

## 2015-04-17 NOTE — Progress Notes (Signed)
Physical Therapy Session Note  Patient Details  Name: Casey Harrell F Ehmke MRN: [id number] Date of Birth: [birth year]  Today's Date: 04/17/2015 PT Individual Time: 1030-1100 PT Individual Time Calculation (min): 30 min   Short Term Goals: Week 2:  PT Short Term Goal 1 (Week 2): STGs = LTGs due to ELOS  Skilled Therapeutic Interventions/Progress Updates:    Pt received up in w/c with husband present - agreeable to PT session. Gait Training - PT instructs pt in ambulation with RW x 215' req SBA for safety - verbal cues to focus gaze on an object to reduce likelihood of vestibular symptoms starting. Therapeutic Exercise - PT places pt on upper body ergometer and instructs her in B reciprocal arm movements at L1 x 8 minutes total with 2 rest breaks. Pt ended up in w/c in room with husband present and all needs in reach. Continue per PT POC.    Therapy Documentation Precautions:  Precautions Precautions: Fall Restrictions Weight Bearing Restrictions: No Pain: Pain Assessment Pain Assessment: No/denies pain   See Function Navigator for Current Functional Status.   Therapy/Group: Individual Therapy  Kensli Bowley M 04/17/2015, 10:32 AM

## 2015-04-17 NOTE — Progress Notes (Signed)
Subjective/Complaints: Coughing noted by RN but pt states it is better  Review of systems no breathing problems no bowel or bladder issues, no joint or muscle pains.  Objective: Vital Signs: Blood pressure 179/74, pulse 71, temperature 97.9 F (36.6 C), temperature source Oral, resp. rate 20, height 5\' 1"  (1.549 m), weight 67.5 kg (148 lb 13 oz), SpO2 95 %. No results found. Results for orders placed or performed during the hospital encounter of 04/05/15 (from the past 72 hour(s))  Glucose, capillary     Status: None   Collection Time: 04/14/15 12:17 PM  Result Value Ref Range   Glucose-Capillary 92 65 - 99 mg/dL  Culture, expectorated sputum-assessment     Status: None   Collection Time: 04/14/15  1:29 PM  Result Value Ref Range   Specimen Description SPUTUM    Special Requests Normal    Sputum evaluation      MICROSCOPIC FINDINGS SUGGEST THAT THIS SPECIMEN IS NOT REPRESENTATIVE OF LOWER RESPIRATORY SECRETIONS. PLEASE RECOLLECT. RESULT CALLED TO, READ BACK BY AND VERIFIED WITH: Melissa MontaneM BARBOUR 04/14/15 @ 1434 M VESTAL    Report Status 04/14/2015 FINAL   Glucose, capillary     Status: Abnormal   Collection Time: 04/14/15  4:57 PM  Result Value Ref Range   Glucose-Capillary 116 (H) 65 - 99 mg/dL  Glucose, capillary     Status: Abnormal   Collection Time: 04/14/15  9:21 PM  Result Value Ref Range   Glucose-Capillary 192 (H) 65 - 99 mg/dL  Glucose, capillary     Status: Abnormal   Collection Time: 04/15/15  6:22 AM  Result Value Ref Range   Glucose-Capillary 139 (H) 65 - 99 mg/dL   Comment 1 Notify RN   Glucose, capillary     Status: None   Collection Time: 04/15/15 11:36 AM  Result Value Ref Range   Glucose-Capillary 98 65 - 99 mg/dL  Glucose, capillary     Status: Abnormal   Collection Time: 04/15/15  4:29 PM  Result Value Ref Range   Glucose-Capillary 157 (H) 65 - 99 mg/dL  Glucose, capillary     Status: Abnormal   Collection Time: 04/15/15  8:53 PM  Result Value Ref  Range   Glucose-Capillary 173 (H) 65 - 99 mg/dL   Comment 1 Notify RN   Glucose, capillary     Status: Abnormal   Collection Time: 04/16/15  6:36 AM  Result Value Ref Range   Glucose-Capillary 126 (H) 65 - 99 mg/dL   Comment 1 Notify RN   Glucose, capillary     Status: Abnormal   Collection Time: 04/16/15 12:55 PM  Result Value Ref Range   Glucose-Capillary 107 (H) 65 - 99 mg/dL     HEENT: normal Cardio: RRR and no murmur Resp: CTA except expiratory wheezes,  unlabored GI: BS positive and NT, ND Extremity:  No Edema Skin:   Intact Neuro: Alert/Oriented, Abnormal Sensory reduced LT, proprio RUE, Abnormal Motor 2- R delt bi tri grip, 4/5 RLE, 5/5 on left side, Abnormal FMC Ataxic/ dec FMC, Inattention and Other R HH Musc/Skel:  Other no pain with UE or LE ROM Gen NAD   Assessment/Plan: 1. Functional deficits secondary to Right hemiparesis, Right homonymous hemianopsia, cognitive deficits related to CVA which require 3+ hours per day of interdisciplinary therapy in a comprehensive inpatient rehab setting. Physiatrist is providing close team supervision and 24 hour management of active medical problems listed below. Physiatrist and rehab team continue to assess barriers to discharge/monitor patient progress  toward functional and medical goals. FIM: Function - Bathing Position: Shower Body parts bathed by patient: Right arm, Left arm, Chest, Abdomen, Front perineal area, Buttocks, Right upper leg, Left upper leg, Right lower leg, Left lower leg Body parts bathed by helper: Back Assist Level: Supervision or verbal cues  Function- Upper Body Dressing/Undressing What is the patient wearing?: Bra, Pull over shirt/dress Bra - Perfomed by patient: Thread/unthread right bra strap, Thread/unthread left bra strap Bra - Perfomed by helper: Hook/unhook bra (pull down sports bra) Pull over shirt/dress - Perfomed by patient: Thread/unthread right sleeve, Thread/unthread left sleeve, Put head  through opening, Pull shirt over trunk Pull over shirt/dress - Perfomed by helper: Thread/unthread right sleeve, Thread/unthread left sleeve, Put head through opening, Pull shirt over trunk Assist Level: Set up Set up : To obtain clothing/put away Function - Lower Body Dressing/Undressing What is the patient wearing?: Non-skid slipper socks, Shoes, Pants, Underwear Position: Wheelchair/chair at sink Underwear - Performed by patient: Thread/unthread right underwear leg, Thread/unthread left underwear leg Underwear - Performed by helper: Pull underwear up/down (hospital brief) Pants- Performed by patient: Thread/unthread right pants leg, Thread/unthread left pants leg, Pull pants up/down, Fasten/unfasten pants Pants- Performed by helper: Pull pants up/down Non-skid slipper socks- Performed by patient: Don/doff right sock, Don/doff left sock Non-skid slipper socks- Performed by helper: Don/doff right sock, Don/doff left sock Socks - Performed by patient: Don/doff right sock, Don/doff left sock Socks - Performed by helper: Don/doff right sock, Don/doff left sock (assisted with part of task) Shoes - Performed by patient: Don/doff right shoe, Don/doff left shoe, Fasten right, Fasten left (using elastic laces) Shoes - Performed by helper: Fasten right, Fasten left Assist for footwear: Setup Assist for lower body dressing: Supervision or verbal cues  Function - Toileting Toileting steps completed by patient: Adjust clothing prior to toileting, Performs perineal hygiene, Adjust clothing after toileting Toileting steps completed by helper: Adjust clothing after toileting Toileting Assistive Devices: Grab bar or rail Assist level: Supervision or verbal cues, Touching or steadying assistance (Pt.75%)  Function - Toilet Transfers Toilet transfer assistive device: Elevated toilet seat/BSC over toilet, Walker, Grab bar Assist level to toilet: Supervision or verbal cues Assist level from toilet:  Supervision or verbal cues  Function - Chair/bed transfer Chair/bed transfer method: Ambulatory Chair/bed transfer assist level: Supervision or verbal cues Chair/bed transfer assistive device: Walker, Armrests Chair/bed transfer details: Verbal cues for safe use of DME/AE  Function - Locomotion: Wheelchair Will patient use wheelchair at discharge?: Yes Type: Manual Max wheelchair distance: 35' Assist Level: Touching or steadying assistance (Pt > 75%) Assist Level: Dependent (Pt equals 0%) Wheel 150 feet activity did not occur: Safety/medical concerns Assist Level: Dependent (Pt equals 0%) Turns around,maneuvers to table,bed, and toilet,negotiates 3% grade,maneuvers on rugs and over doorsills: No Function - Locomotion: Ambulation Assistive device: Walker-rolling Max distance: 155' Assist level: Supervision or verbal cues Walk 10 feet activity did not occur: Safety/medical concerns Assist level: Supervision or verbal cues Walk 50 feet with 2 turns activity did not occur: Safety/medical concerns Assist level: Supervision or verbal cues Walk 150 feet activity did not occur: Safety/medical concerns Assist level: Supervision or verbal cues Walk 10 feet on uneven surfaces activity did not occur: Safety/medical concerns Assist level: Touching or steadying assistance (Pt > 75%)  Function - Comprehension Comprehension: Auditory Comprehension assist level: Understands basic 90% of the time/cues < 10% of the time  Function - Expression Expression: Verbal Expression assist level: Expresses basic 90% of the time/requires cueing < 10%  of the time.  Function - Social Interaction Social Interaction assist level: Interacts appropriately 90% of the time - Needs monitoring or encouragement for participation or interaction.  Function - Problem Solving Problem solving assist level: Solves basic 75 - 89% of the time/requires cueing 10 - 24% of the time  Function - Memory Memory assist level:  Recognizes or recalls 75 - 89% of the time/requires cueing 10 - 24% of the time Patient normally able to recall (first 3 days only): That he or she is in a hospital, Current season  Medical Problem List and Plan: 1.  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  DVT Prophylaxis/Anticoagulation: Pharmaceutical: Lovenox 3. Pain Management:  Tylenol prn 4. Mood: LCSW to follow for evaluation and support.   5. Neuropsych: This patient is not capable of making decisions onher own behalf. 6. Skin/Wound Care: Routine pressure relief measures. Maintain adequate nutrition and hydration status 7. Fluids/Electrolytes/Nutrition:  Monitor I/O. Check lytes in am.   8. DM type 2:   Home regimen included Invokana 300 mg, Metformin 1000 mg bid, Levemir 40 mg bid and Novolog 20-10-20 ac. Monitor BS ac/hs Currently on levimir 40U BID and Novalog 8 U with meals, CBGs oImproved 9. HTN: Monitor BP every 8 hours.  Check orthostatic BP in am and slowly resume medications as indicated. Continue coreg bid.   HR 77, BP elevated at 179/74 today, monitor trend 10. Diabetic Neuropathy:  Elavil was dc due to concerns of SE--Continue lyrica. and cymbalta, no complaints of neuropathic pain today 11. Leucocytosis: . Will monitor for fevers and other signs of infection.   12. Hypokalemia:  Will supplement for now and recheck in am.  Likely dilutional due to IVF which were d/c today.    14. OSA: Does not tolerate CPAP 15. Intermittent diarrhea x few months:  due to metformin 16. COPD with chronic bronchitis/cough:lung exam normal today , WBC nl on 12/7, afeb, increase pulmicort, sounds mainly upper airway, cont symptomatic care   LOS (Days) 12 A FACE TO FACE EVALUATION WAS PERFORMED  KIRSTEINS,ANDREW E 04/17/2015, 9:19 AM

## 2015-04-17 NOTE — Progress Notes (Signed)
Lab contacted nurse and reported sputum culture rejected. Dr. Wynn BankerKirsteins contacted. Orders given to discontinue sputum cultures. adm

## 2015-04-18 ENCOUNTER — Inpatient Hospital Stay (HOSPITAL_COMMUNITY): Payer: Medicare Other

## 2015-04-18 LAB — GLUCOSE, CAPILLARY
Glucose-Capillary: 160 mg/dL — ABNORMAL HIGH (ref 65–99)
Glucose-Capillary: 117 mg/dL — ABNORMAL HIGH (ref 65–99)
Glucose-Capillary: 146 mg/dL — ABNORMAL HIGH (ref 65–99)
Glucose-Capillary: 170 mg/dL — ABNORMAL HIGH (ref 65–99)
Glucose-Capillary: 113 mg/dL — ABNORMAL HIGH (ref 65–99)
Glucose-Capillary: 173 mg/dL — ABNORMAL HIGH (ref 65–99)

## 2015-04-18 MED ORDER — PREDNISONE 5 MG (21) PO TBPK
5.0000 mg | ORAL_TABLET | Freq: Three times a day (TID) | ORAL | Status: AC
  Administered 2015-04-19 (×3): 5 mg via ORAL

## 2015-04-18 MED ORDER — PREDNISONE 5 MG (21) PO TBPK
5.0000 mg | ORAL_TABLET | ORAL | Status: AC
  Administered 2015-04-18: 5 mg via ORAL

## 2015-04-18 MED ORDER — PREDNISONE 5 MG (21) PO TBPK
10.0000 mg | ORAL_TABLET | Freq: Every evening | ORAL | Status: AC
  Administered 2015-04-18: 10 mg via ORAL

## 2015-04-18 MED ORDER — PREDNISONE 5 MG (21) PO TBPK
5.0000 mg | ORAL_TABLET | Freq: Four times a day (QID) | ORAL | Status: DC
  Administered 2015-04-20 (×2): 5 mg via ORAL

## 2015-04-18 MED ORDER — PREDNISONE 5 MG (21) PO TBPK
10.0000 mg | ORAL_TABLET | Freq: Every morning | ORAL | Status: AC
  Administered 2015-04-18: 10 mg via ORAL
  Filled 2015-04-18: qty 21

## 2015-04-18 MED ORDER — PREDNISONE 5 MG (21) PO TBPK
10.0000 mg | ORAL_TABLET | Freq: Every evening | ORAL | Status: AC
  Administered 2015-04-19: 10 mg via ORAL

## 2015-04-18 NOTE — Progress Notes (Signed)
Occupational Therapy Note  Patient Details  Name: Casey Harrell MRN: [id number] Date of Birth: 11-Dec-1934  Today's Date: 04/18/2015 OT Individual Time: 1000-1055 OT Individual Time Calculation (min): 55 min   Pt denied pain Individual Therapy  Pt resting in w/c upon arrival with husband present.  Pt stated (verifed by husband) that she had already washed this morning and her clothing was clean. Pt amb with RW to ADL apartment with supervision.  Pt with no c/o dizziness.  Pt initially engaged in practicing walk-in shower transfers X 3. Recommended that patient practice at home before taking shower.  Husband and pt verbalized understanding.  Pt amd with RW to EOB and pulled down covers to lay down in bed.  Upon laying down in bed, pt c/o increased dizziness.  Pt utilized strategies/techniques to minimize dizziness and was able to sit EOB after multiple attempts and extended rest time.  Pt returned to room in w/c and remained in w/c with husband present.  Focus on activity tolerance, sit<>stand, standing balance, functional amb with RW, functional transfers, continued discharge planning, family education, and safety awareness.   Lavone NeriLanier, Ilisa Hayworth [facility] 04/18/2015, 10:58 AM

## 2015-04-18 NOTE — Progress Notes (Signed)
Subjective/Complaints:  Still coughing no sweats or cheills Uses inhaler prn at home, has had to use prednisone tablets as well Review of systems no breathing problems no bowel or bladder issues, no joint or muscle pains.  Objective: Vital Signs: Blood pressure 138/55, pulse 86, temperature 97.9 F (36.6 C), temperature source Oral, resp. rate 18, height  (1.549 m), weight 67.5 kg (148 lb 13 oz), SpO2 94 %. No results found. Results for orders placed or performed during the hospital encounter of 04/05/15 (from the past 72 hour(s))  Glucose, capillary     Status: None   Collection Time: 04/15/15 11:36 AM  Result Value Ref Range   Glucose-Capillary 98 65 - 99 mg/dL  Glucose, capillary     Status: Abnormal   Collection Time: 04/15/15  4:29 PM  Result Value Ref Range   Glucose-Capillary 157 (H) 65 - 99 mg/dL  Glucose, capillary     Status: Abnormal   Collection Time: 04/15/15  8:53 PM  Result Value Ref Range   Glucose-Capillary 173 (H) 65 - 99 mg/dL   Comment 1 Notify RN   Glucose, capillary     Status: Abnormal   Collection Time: 04/16/15  6:36 AM  Result Value Ref Range   Glucose-Capillary 126 (H) 65 - 99 mg/dL   Comment 1 Notify RN   Glucose, capillary     Status: Abnormal   Collection Time: 04/16/15 12:55 PM  Result Value Ref Range   Glucose-Capillary 107 (H) 65 - 99 mg/dL  Glucose, capillary     Status: Abnormal   Collection Time: 04/16/15  4:22 PM  Result Value Ref Range   Glucose-Capillary 160 (H) 65 - 99 mg/dL  Glucose, capillary     Status: Abnormal   Collection Time: 04/16/15  9:02 PM  Result Value Ref Range   Glucose-Capillary 117 (H) 65 - 99 mg/dL  Glucose, capillary     Status: Abnormal   Collection Time: 04/17/15  6:52 AM  Result Value Ref Range   Glucose-Capillary 146 (H) 65 - 99 mg/dL  Culture, expectorated sputum-assessment     Status: None   Collection Time: 04/17/15  1:16 PM  Result Value Ref Range   Specimen Description EXPECTORATED SPUTUM     Special Requests NORMAL    Sputum evaluation      MICROSCOPIC FINDINGS SUGGEST THAT THIS SPECIMEN IS NOT REPRESENTATIVE OF LOWER RESPIRATORY SECRETIONS. PLEASE RECOLLECT. Gram Stain Report Called to,Read Back By and Verified With: A MCCRAY,RN AT 1354 04/17/15 BY L BENFIELD    Report Status 04/17/2015 FINAL   Glucose, capillary     Status: Abnormal   Collection Time: 04/17/15  5:32 PM  Result Value Ref Range   Glucose-Capillary 170 (H) 65 - 99 mg/dL  Glucose, capillary     Status: Abnormal   Collection Time: 04/17/15  9:46 PM  Result Value Ref Range   Glucose-Capillary 113 (H) 65 - 99 mg/dL   Comment 1 Notify RN   Glucose, capillary     Status: Abnormal   Collection Time: 04/17/15 10:26 PM  Result Value Ref Range   Glucose-Capillary 173 (H) 65 - 99 mg/dL     HEENT: normal Cardio: RRR and no murmur Resp: CTA except expiratory wheezes,  unlabored GI: BS positive and NT, ND Extremity:  No Edema Skin:   Intact Neuro: Alert/Oriented, Abnormal Sensory reduced LT, proprio RUE, Abnormal Motor 2- R delt bi tri grip, 4/5 RLE, 5/5 on left side, Abnormal FMC Ataxic/ dec FMC, Inattention and Other  R HH Musc/Skel:  Other no pain with UE or LE ROM Gen NAD   Assessment/Plan: 1. Functional deficits secondary to Right hemiparesis, Right homonymous hemianopsia, cognitive deficits related to CVA which require 3+ hours per day of interdisciplinary therapy in a comprehensive inpatient rehab setting. Physiatrist is providing close team supervision and 24 hour management of active medical problems listed below. Physiatrist and rehab team continue to assess barriers to discharge/monitor patient progress toward functional and medical goals. FIM: Function - Bathing Position: Shower Body parts bathed by patient: Right arm, Left arm, Chest, Abdomen, Front perineal area, Buttocks, Right upper leg, Left upper leg, Right lower leg, Left lower leg Body parts bathed by helper: Back Assist Level: Supervision or  verbal cues  Function- Upper Body Dressing/Undressing What is the patient wearing?: Bra, Pull over shirt/dress Bra - Perfomed by patient: Thread/unthread right bra strap, Thread/unthread left bra strap Bra - Perfomed by helper: Hook/unhook bra (pull down sports bra) Pull over shirt/dress - Perfomed by patient: Thread/unthread right sleeve, Thread/unthread left sleeve, Put head through opening, Pull shirt over trunk Pull over shirt/dress - Perfomed by helper: Thread/unthread right sleeve, Thread/unthread left sleeve, Put head through opening, Pull shirt over trunk Assist Level: Set up Set up : To obtain clothing/put away Function - Lower Body Dressing/Undressing What is the patient wearing?: Non-skid slipper socks, Shoes, Pants, Underwear Position: Wheelchair/chair at sink Underwear - Performed by patient: Thread/unthread right underwear leg, Thread/unthread left underwear leg Underwear - Performed by helper: Pull underwear up/down (hospital brief) Pants- Performed by patient: Thread/unthread right pants leg, Thread/unthread left pants leg, Pull pants up/down, Fasten/unfasten pants Pants- Performed by helper: Pull pants up/down Non-skid slipper socks- Performed by patient: Don/doff right sock, Don/doff left sock Non-skid slipper socks- Performed by helper: Don/doff right sock, Don/doff left sock Socks - Performed by patient: Don/doff right sock, Don/doff left sock Socks - Performed by helper: Don/doff right sock, Don/doff left sock (assisted with part of task) Shoes - Performed by patient: Don/doff right shoe, Don/doff left shoe, Fasten right, Fasten left (using elastic laces) Shoes - Performed by helper: Fasten right, Fasten left Assist for footwear: Setup Assist for lower body dressing: Supervision or verbal cues  Function - Toileting Toileting steps completed by patient: Adjust clothing prior to toileting, Performs perineal hygiene, Adjust clothing after toileting Toileting steps  completed by helper: Adjust clothing after toileting Toileting Assistive Devices: Grab bar or rail Assist level: Supervision or verbal cues, Touching or steadying assistance (Pt.75%)  Function - Toilet Transfers Toilet transfer assistive device: Elevated toilet seat/BSC over toilet, Walker, Grab bar Assist level to toilet: Supervision or verbal cues Assist level from toilet: Supervision or verbal cues  Function - Chair/bed transfer Chair/bed transfer method: Ambulatory Chair/bed transfer assist level: Supervision or verbal cues Chair/bed transfer assistive device: Walker, Armrests Chair/bed transfer details: Verbal cues for safe use of DME/AE, Verbal cues for technique, Verbal cues for precautions/safety  Function - Locomotion: Wheelchair Will patient use wheelchair at discharge?: Yes Type: Manual Max wheelchair distance: 73' Assist Level: Touching or steadying assistance (Pt > 75%) Assist Level: Dependent (Pt equals 0%) Wheel 150 feet activity did not occur: Safety/medical concerns Assist Level: Dependent (Pt equals 0%) Turns around,maneuvers to table,bed, and toilet,negotiates 3% grade,maneuvers on rugs and over doorsills: No Function - Locomotion: Ambulation Assistive device: Walker-rolling Max distance: 215' Assist level: Supervision or verbal cues Walk 10 feet activity did not occur: Safety/medical concerns Assist level: Supervision or verbal cues Walk 50 feet with 2 turns activity did not  occur: Safety/medical concerns Assist level: Supervision or verbal cues Walk 150 feet activity did not occur: Safety/medical concerns Assist level: Supervision or verbal cues Walk 10 feet on uneven surfaces activity did not occur: Safety/medical concerns Assist level: Touching or steadying assistance (Pt > 75%)  Function - Comprehension Comprehension: Auditory Comprehension assist level: Understands basic 90% of the time/cues < 10% of the time  Function - Expression Expression:  Verbal Expression assist level: Expresses basic 90% of the time/requires cueing < 10% of the time.  Function - Social Interaction Social Interaction assist level: Interacts appropriately 90% of the time - Needs monitoring or encouragement for participation or interaction.  Function - Problem Solving Problem solving assist level: Solves basic 75 - 89% of the time/requires cueing 10 - 24% of the time  Function - Memory Memory assist level: Recognizes or recalls 75 - 89% of the time/requires cueing 10 - 24% of the time Patient normally able to recall (first 3 days only): That he or she is in a hospital, Current season  Medical Problem List and Plan: 1.  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  DVT Prophylaxis/Anticoagulation: Pharmaceutical: Lovenox 3. Pain Management:  Tylenol prn 4. Mood: LCSW to follow for evaluation and support.   5. Neuropsych: This patient is not capable of making decisions onher own behalf. 6. Skin/Wound Care: Routine pressure relief measures. Maintain adequate nutrition and hydration status 7. Fluids/Electrolytes/Nutrition:  Monitor I/O. Check lytes in am.   8. DM type 2:   Home regimen included Invokana 300 mg, Metformin 1000 mg bid, Levemir 40 mg bid and Novolog 20-10-20 ac. Monitor BS ac/hs Currently on levimir 40U BID and Novalog 8 U with meals, CBG will go up with prednisone 9. HTN: Monitor BP every 8 hours.   Continue coreg bid.   HR 86, BP elevated at 138/55 today, monitor trend 10. Diabetic Neuropathy:  Elavil was dc due to concerns of SE--Continue lyrica. and cymbalta, no complaints of neuropathic pain today 11. Leucocytosis: . Will monitor for fevers and other signs of infection.   12. Hypokalemia:  Will supplement for now and recheck in am.    14. OSA: Does not tolerate CPAP 15. Intermittent diarrhea x few months:  due to metformin 16. COPD with chronic  bronchitis/cough:lung exam normal today , WBC nl on 12/7 repeat in am, afeb, increase pulmicort,cont albuterol, ad prednisone   LOS (Days) 13 A FACE TO FACE EVALUATION WAS PERFORMED  KIRSTEINS,ANDREW E 04/18/2015, 9:19 AM

## 2015-04-19 ENCOUNTER — Inpatient Hospital Stay (HOSPITAL_COMMUNITY): Payer: Medicare Other

## 2015-04-19 ENCOUNTER — Inpatient Hospital Stay (HOSPITAL_COMMUNITY): Payer: Medicare Other | Admitting: Physical Therapy

## 2015-04-19 ENCOUNTER — Inpatient Hospital Stay (HOSPITAL_COMMUNITY): Payer: Medicare Other | Admitting: Speech Pathology

## 2015-04-19 ENCOUNTER — Inpatient Hospital Stay (HOSPITAL_COMMUNITY): Payer: Medicare Other | Admitting: Occupational Therapy

## 2015-04-19 DIAGNOSIS — J441 Chronic obstructive pulmonary disease with (acute) exacerbation: Secondary | ICD-10-CM

## 2015-04-19 LAB — CBC WITH DIFFERENTIAL/PLATELET
Basophils Absolute: 0 10*3/uL (ref 0.0–0.1)
Basophils Relative: 0 %
Eosinophils Absolute: 0 10*3/uL (ref 0.0–0.7)
Eosinophils Relative: 0 %
HCT: 38.7 % (ref 36.0–46.0)
Hemoglobin: 12.9 g/dL (ref 12.0–15.0)
Lymphocytes Relative: 16 %
Lymphs Abs: 2.8 10*3/uL (ref 0.7–4.0)
MCH: 26.5 pg (ref 26.0–34.0)
MCHC: 33.3 g/dL (ref 30.0–36.0)
MCV: 79.5 fL (ref 78.0–100.0)
Monocytes Absolute: 0.7 10*3/uL (ref 0.1–1.0)
Monocytes Relative: 4 %
Neutro Abs: 13.8 10*3/uL — ABNORMAL HIGH (ref 1.7–7.7)
Neutrophils Relative %: 80 %
Platelets: 321 10*3/uL (ref 150–400)
RBC: 4.87 MIL/uL (ref 3.87–5.11)
RDW: 16.2 % — ABNORMAL HIGH (ref 11.5–15.5)
WBC: 17.3 10*3/uL — ABNORMAL HIGH (ref 4.0–10.5)

## 2015-04-19 LAB — BASIC METABOLIC PANEL
Anion gap: 13 (ref 5–15)
BUN: 27 mg/dL — ABNORMAL HIGH (ref 6–20)
CO2: 18 mmol/L — ABNORMAL LOW (ref 22–32)
Calcium: 9.7 mg/dL (ref 8.9–10.3)
Chloride: 99 mmol/L — ABNORMAL LOW (ref 101–111)
Creatinine, Ser: 1.29 mg/dL — ABNORMAL HIGH (ref 0.44–1.00)
GFR calc Af Amer: 45 mL/min — ABNORMAL LOW (ref >60)
GFR calc non Af Amer: 39 mL/min — ABNORMAL LOW (ref >60)
Glucose, Bld: 295 mg/dL — ABNORMAL HIGH (ref 65–99)
Potassium: 4.7 mmol/L (ref 3.5–5.1)
Sodium: 130 mmol/L — ABNORMAL LOW (ref 135–145)

## 2015-04-19 LAB — CREATININE, SERUM
Creatinine, Ser: 0.87 mg/dL (ref 0.44–1.00)
GFR calc Af Amer: >60 mL/min (ref >60)
GFR calc non Af Amer: >60 mL/min (ref >60)

## 2015-04-19 NOTE — Progress Notes (Signed)
Occupational Therapy Session Note  Patient Details  Name: Casey Harrell MRN: [id number] Date of Birth: Jan 30, 1935  Today's Date: 04/19/2015 OT Individual Time: [id number] OT Individual Time Calculation (min): 60 min    Short Term Goals: Week 2:  OT Short Term Goal 1 (Week 2): STG=LTG d/t short remaining LOS.  Skilled Therapeutic Interventions/Progress Updates: ADL-retraining with focus on family education (husband present) during assisted self-care.   Per husband, their shower has a built-in seat sufficient for pt's needs.   Husband is aware that pt also benefits from use of BSC over toilet as safety frame and at beside at night.  Pt completed all BADL this session with husband providing setup and supervision during transfers and min assist for upper body dressing to fasten pt's bra before donning overhead.   No LOB observed during session.   Husband reports awareness of pt's need for cues and assist with problem-solving during BADL stating, "I know her needs very well."    Pt left with husband attending at end of session and all needs within reach.     Therapy Documentation Precautions:  Precautions Precautions: Fall Restrictions Weight Bearing Restrictions: No  Vital Signs: Therapy Vitals Temp: 98 F (36.7 C) Temp Source: Oral Pulse Rate: 92 BP: (!) 107/95 mmHg Patient Position (if appropriate): Standing Oxygen Therapy SpO2: 96 % O2 Device: Not Delivered   Pain: Pain Assessment Pain Assessment: No/denies pain  See Function Navigator for Current Functional Status.   Therapy/Group: Individual Therapy  Mazella Deen 04/19/2015, 9:22 AM

## 2015-04-19 NOTE — Progress Notes (Signed)
Physical Therapy Discharge Summary  Patient Details  Name: Casey Harrell MRN: [id number] Date of Birth: November 23, 1934  Today's Date: 04/19/2015 PT Individual Time: 1002-1057 PT Individual Time Calculation (min): 55 min    Patient has met 9 of 9 long term goals due to improved activity tolerance, improved balance, improved postural control, increased strength, functional use of  right upper extremity and right lower extremity and improved coordination.  Patient to discharge at an ambulatory level Supervision.   Patient's care partner is independent to provide the necessary supervision assistance at discharge.  Recommendation:  Patient will benefit from ongoing skilled PT services in home health setting to continue to advance safe functional mobility, address ongoing impairments in ROM, strength, R hemiparesis, and balance, and minimize fall risk.   Reasons for discharge: treatment goals met  Patient/family agrees with progress made and goals achieved: Yes   Skilled Therapeutic Intervention: Pt received resting in w/c and agreeable to therapy session.  Session focus on family education, activity tolerance, gait with LRAD, overall functional mobility, and safety awareness.  PT instructed patient in sit<>stand and stand/pivot transfers with RW and supervision with verbal cues to reach back with hands before sitting.  PT provided husband with education in same, and husband verbalized understanding.  PT instructed patient in gait with RW x150' with supervision for safety, husband able to return demonstration x150' to return to room at end of session.  PT instructed patient in stair negotiation x4 steps with L handrail and close supervision for safety; pt returned demonstration with pt negotiating 8 steps with L handrail.  PT instructed patient in car transfer with RW and supervision, requiring verbal cues to maintain walker in front of her during transfer; husband observed and verbalized understanding of  car transfer. PT instructed patient in supine<>sit while focusing on one object during transfer to reduce dizziness.  Pt returned to room at end of session and positioned in w/c with call bell in reach and needs met.   PT Discharge Precautions/Restrictions Precautions Precautions: Fall Restrictions Weight Bearing Restrictions: No Pain Pain Assessment Pain Assessment: No/denies pain Cognition Overall Cognitive Status: Within Functional Limits for tasks assessed Arousal/Alertness: Awake/alert Orientation Level: Oriented X4 Attention: Sustained Sustained Attention: Appears intact Memory: Appears intact Sensation Sensation Light Touch: Appears Intact Motor  Motor Motor: Within Functional Limits Motor - Discharge Observations: RUE weakness improving  Mobility Bed Mobility Bed Mobility: Sit to Supine;Supine to Sit Supine to Sit: 5: Supervision Supine to Sit Details: Verbal cues for technique;Verbal cues for sequencing Sit to Supine: 5: Supervision Sit to Supine - Details: Verbal cues for sequencing;Verbal cues for technique Transfers Transfers: Yes Sit to Stand: 5: Supervision Stand to Sit: 5: Supervision Stand Pivot Transfers: 5: Supervision Locomotion  Ambulation Ambulation: Yes Ambulation/Gait Assistance: 5: Supervision Ambulation Distance (Feet): 150 Feet Assistive device: Rolling walker Gait Gait: Yes Gait Pattern: Step-through pattern;Decreased stride length;Decreased hip/knee flexion - right Stairs / Additional Locomotion Stairs: Yes Stairs Assistance: 4: Min guard;5: Supervision Stairs Assistance Details: Verbal cues for technique;Verbal cues for sequencing Stair Management Technique: One rail Left Number of Stairs: 12 Height of Stairs: 6 Wheelchair Mobility Wheelchair Mobility: No (pt to use w/c for community level only)  Trunk/Postural Assessment  Cervical Assessment Cervical Assessment: Exceptions to [facility] (forward head posture) Thoracic  Assessment Thoracic Assessment: Exceptions to WFL (mild kyphosis, L shoulder elevated) Lumbar Assessment Lumbar Assessment: Within Functional Limits  Balance Balance Balance Assessed: Yes Static Sitting Balance Static Sitting - Balance Support: No upper extremity supported Static Sitting -  Level of Assistance: 6: Modified independent (Device/Increase time) Dynamic Sitting Balance Dynamic Sitting - Balance Support: No upper extremity supported;Left upper extremity supported Dynamic Sitting - Level of Assistance: 6: Modified independent (Device/Increase time) Dynamic Sitting - Balance Activities: Lateral lean/weight shifting;Forward lean/weight shifting;Reaching across midline Static Standing Balance Static Standing - Balance Support: Bilateral upper extremity supported Static Standing - Level of Assistance: 6: Modified independent (Device/Increase time) Static Standing - Comment/# of Minutes: 1 Static Stance: Eyes closed Static Stance: Eyes Closed: 30, supervision Dynamic Standing Balance Dynamic Standing - Balance Support: Right upper extremity supported;Left upper extremity supported Dynamic Standing - Level of Assistance: 5: Stand by assistance Dynamic Standing - Balance Activities: Lateral lean/weight shifting Extremity Assessment   RLE Assessment RLE Assessment: Within Functional Limits RLE Strength RLE Overall Strength Comments: hip flexion 4/5, knee flexion 3+/5, knee extension 5/5, DF/PF 4+/5 LLE Assessment LLE Assessment: Within Functional Limits LLE Strength LLE Overall Strength Comments: hip flexion 3+/5, knee flexion 3+/5, knee extension 5/5, DF/PF 4+/5   See Function Navigator for Current Functional Status.  Sevilla Murtagh E Penven-Crew 04/19/2015, 12:10 PM

## 2015-04-19 NOTE — Progress Notes (Signed)
Social Work Patient ID: Casey Harrell, female   DOB: 1934-08-19, 80 y.o.   MRN: [id number] Speech Therapist-Courtney reports pt would like a wheelchair for a short time at home, when met with pt and husband they are agreeable to this and feel It would be helpful. Made referral to [facility] to deliver the wheelchair prior to discharge tomorrow.

## 2015-04-19 NOTE — Progress Notes (Signed)
Occupational Therapy Discharge Summary  Patient Details  Name: Casey Harrell MRN: [id number] Date of Birth: [birth year]  Patient has met 10 of 12 long term goals due to improved activity tolerance, improved balance, ability to compensate for deficits and functional use of  RIGHT upper extremity.  Patient to discharge at [facility] Assist level.  Patient's care partner is independent to provide the necessary physical and cognitive assistance at discharge.    Reasons goals not met: Meal prep task completed at mod assist level; activity not recommended d/t cognitive impairment with risk of burn or sharps injury.  Pt requires assist with fastening bra although pt has acknowledges OT recommendation to use sports bra as replacement.  Recommendation:  Patient will benefit from ongoing skilled OT services in home health setting to continue to advance functional skills in the area of BADL and iADL.  Equipment: BSC (3:1)  Reasons for discharge: discharge from hospital  Patient/family agrees with progress made and goals achieved: Yes  OT Discharge Precautions/Restrictions  Precautions Precautions: Fall Restrictions Weight Bearing Restrictions: No  Pain Pain Assessment Pain Assessment: No/denies pain  ADL ADL ADL Comments: see Funtional Assessment Tool  Vision/Perception  Vision- History Baseline Vision/History: Wears glasses Wears Glasses: Reading only Patient Visual Report: No change from baseline Vision- Assessment Vision Assessment?: No apparent visual deficits   Cognition Overall Cognitive Status: Impaired/Different from baseline Arousal/Alertness: Awake/alert Orientation Level: Oriented X4 Attention: Sustained Sustained Attention: Appears intact Memory: Appears intact Awareness: Appears intact Awareness Impairment: Emergent impairment Problem Solving: Impaired Problem Solving Impairment: Functional complex Behaviors: Impulsive Safety/Judgment: Impaired Comments: Pt  requires supervision during tasks requiring dynamic standing balance and she continues to attempt such tasks w/o requesting assist as advised.  Sensation Sensation Light Touch: Appears Intact Stereognosis: Appears Intact Hot/Cold: Appears Intact Proprioception: Impaired Detail Proprioception Impaired Details: Impaired RUE Additional Comments: mild deficits with placing hand in space (over and undershoots target) Coordination Gross Motor Movements are Fluid and Coordinated: No Fine Motor Movements are Fluid and Coordinated: No Coordination and Movement Description: persistent although now mild incoordination  of right UE; decreased FMC d/t grip/pinch weakness  Motor  Motor Motor: Other (comment) Motor - Discharge Observations: RUE weakness improving  Mobility  Bed Mobility Bed Mobility: Sit to Supine;Supine to Sit Supine to Sit: 5: Supervision Supine to Sit Details: Verbal cues for technique;Verbal cues for sequencing Sit to Supine: 5: Supervision Sit to Supine - Details: Verbal cues for sequencing;Verbal cues for technique Transfers Transfers: Sit to Stand;Stand to Sit Sit to Stand: 5: Supervision Stand to Sit: 5: Supervision   Trunk/Postural Assessment  Cervical Assessment Cervical Assessment: Exceptions to [facility] (forward head posture) Thoracic Assessment Thoracic Assessment: Exceptions to [facility] (mild kyphosis) Lumbar Assessment Lumbar Assessment: Within Functional Limits Postural Control Postural Control: Within Functional Limits   Balance Balance Balance Assessed: Yes Static Sitting Balance Static Sitting - Balance Support: No upper extremity supported Static Sitting - Level of Assistance: 6: Modified independent (Device/Increase time) Dynamic Sitting Balance Dynamic Sitting - Balance Support: No upper extremity supported;Left upper extremity supported Dynamic Sitting - Level of Assistance: 6: Modified independent (Device/Increase time) Dynamic Sitting - Balance  Activities: Lateral lean/weight shifting;Forward lean/weight shifting;Reaching across midline Static Standing Balance Static Standing - Balance Support: Bilateral upper extremity supported Static Standing - Level of Assistance: 6: Modified independent (Device/Increase time) Static Stance: Eyes closed Static Stance: Eyes Closed: 30, supervision Dynamic Standing Balance Dynamic Standing - Level of Assistance: 5: Stand by assistance Dynamic Standing - Balance Activities: Lateral lean/weight shifting  Extremity/Trunk Assessment RUE Assessment RUE Assessment: Exceptions to [facility] RUE Strength RUE Overall Strength Comments: deltoid 3+/5, bicep/tricep 4-/5, wrist 3-/5 LUE Assessment LUE Assessment: Within Functional Limits   See Function Navigator for Current Functional Status.  [facility] 04/20/2015, 11:47 AM

## 2015-04-19 NOTE — Progress Notes (Signed)
Occupational Therapy Session Note  Patient Details  Name: Casey Harrell MRN: [id number] Date of Birth: [birth year]  Today's Date: 04/19/2015 OT Individual Time: 1405-1500 OT Individual Time Calculation (min): 55 min   Skilled Therapeutic Interventions/Progress Updates:    Pt ambulated to the therapy apartment with supervision using the RW, and husband guarding her.  Once in the ADL apartment practiced walk-in shower transfers using the simulated shower edge.  Discussed 2 scenarios of transfer, one going in forward using grab bars and one stepping over in reverse using the RW for support.  Pt's husband able to assist with transfer.  Husband plans to get their daughter to purchase grab bars.  Also discussed need for hand held shower for pt to use while sitting on built in seat as well.  They will look into purchasing.  Second part of session had her husband ambulate her to the therapy gym with close supervision.  She worked on [employer] coordination tasks and gross motor tasks while sitting EOM.  Completed 9 hole peg test using the RUE in approximately 2 mins 54 seconds first trial and 2 mins 50 seconds on the second trial.  Transitioned to larger pegs, having her pick them up one at a time and place in grid.  Demonstrated 1 drop for every 5-6 pegs.  Also worked on taking pegs out with translation from fingertips to hand to place back in container.  Pt with decreased ability to complete in-hand manipulation.  Finished session with gross motor beach ball toss.  Pt able to catch 80% of tosses and throw back, some with overhead throw and some with chest pass.  Ambulated back to room and placed in wheelchair to complete session.  Pt's husband and sister present for session.   Therapy Documentation Precautions:  Precautions Precautions: Fall Restrictions Weight Bearing Restrictions: No  Pain: Pain Assessment Pain Assessment: No/denies pain ADL: See Function Navigator for Current Functional  Status.   Therapy/Group: Individual Therapy  Casey Harrell OTR/L 04/19/2015, 4:20 PM

## 2015-04-19 NOTE — Progress Notes (Signed)
Speech Language Pathology Session Note & Discharge Summary  Patient Details  Name: Casey Harrell MRN: [id number] Date of Birth: [birth year]  Today's Date: 04/19/2015 SLP Individual Time: 1300-1330 SLP Individual Time Calculation (min): 30 min   Skilled Therapeutic Interventions:  Skilled treatment session focused on cognitive-linguistic goals and completion of patient and family education. Patient was Mod I for following 2 and 3 step commands and completed generative naming tasks with 100% accuracy. Patient also completed convergent, divergent and category exclusion tasks with 100% accuracy with supervision question cues. Patient's husband has been present throughout the majority of the sessions and reported he has no questions at this time in regards to the patient's current cognitive-linguistic function and strategies to utilize at home to maximize word-finding and overall safety with functional and familiar tasks. Patient left upright in wheelchair with all needs within reach. Continue with current plan of care.   Patient has met 5 of 5 long term goals.  Patient to discharge at overall Supervision level.   Reasons goals not met: N/A   Clinical Impression/Discharge Summary: Patient has made functional gains and has met 5 of 5 LTG's this admission due to improved cognitive-linguistic function. Currently, patient requires overall supervision A for complex problem solving and recall of new information and for verbal expression at the sentence level due to impaired word-finding and is Mod I for comprehension of complex information. Patient and family education is complete and patient will discharge home with 24 hour supervision from family. Patient would benefit from f/u home health SLP services to maximize cognitive-linguistic function and overall functional independence to reduce caregiver burden.   Care Partner:  Caregiver Able to Provide Assistance: Yes  Type of Caregiver Assistance:  Physical;Cognitive  Recommendation:  Home Health SLP;24 hour supervision/assistance  Rationale for SLP Follow Up: Maximize cognitive function and independence;Maximize functional communication;Reduce caregiver burden   Equipment: N/A   Reasons for discharge: Treatment goals met;Discharged from hospital   Patient/Family Agrees with Progress Made and Goals Achieved: Yes   Function:  Cognition Comprehension Comprehension assist level: Understands complex 90% of the time/cues 10% of the time  Expression   Expression assist level: Expresses complex 90% of the time/cues < 10% of the time  Social Interaction Social Interaction assist level: Interacts appropriately 90% of the time - Needs monitoring or encouragement for participation or interaction.  Problem Solving Problem solving assist level: Solves complex 90% of the time/cues < 10% of the time  Memory Memory assist level: Recognizes or recalls 75 - 89% of the time/requires cueing 10 - 24% of the time   Treylen Gibbs 04/19/2015, 4:24 PM

## 2015-04-19 NOTE — Discharge Instructions (Signed)
Inpatient Rehab Discharge Instructions  Casey Harrell Discharge date and time:    Activities/Precautions/ Functional Status: Activity: activity as tolerated Diet: cardiac diet and diabetic diet Wound Care: none needed Functional status:  ___ No restrictions     ___ Walk up steps independently ___ 24/7 supervision/assistance   ___ Walk up steps with assistance ___ Intermittent supervision/assistance  ___ Bathe/dress independently ___ Walk with walker     ___ Bathe/dress with assistance ___ Walk Independently    ___ Shower independently ___ Walk with assistance    ___ Shower with assistance _X__ No alcohol     ___ Return to work/school ________   Special Instructions:    COMMUNITY REFERRALS UPON DISCHARGE:    Home Health:   PT, OT, SP, RN  Agency:[facility] [phone]   Date of last service:04/20/2015  Medical Equipment/Items Ordered:BEDSIDE COMMODE  Agency/Supplier:[facility]   [phone]    STROKE/TIA DISCHARGE INSTRUCTIONS SMOKING Cigarette smoking nearly doubles your risk of having a stroke & is the single most alterable risk factor  If you smoke or have smoked in the last 12 months, you are advised to quit smoking for your health.  Most of the excess cardiovascular risk related to smoking disappears within a year of stopping.  Ask you doctor about anti-smoking medications  [place] Quit Line: 1-800-QUIT NOW  Free Smoking Cessation Classes [phone]  CHOLESTEROL Know your levels; limit fat & cholesterol in your diet  Lipid Panel     Component Value Date/Time   CHOL 137 04/01/2015 0422   TRIG 174* 04/01/2015 0422   HDL 26* 04/01/2015 0422   CHOLHDL 5.3 04/01/2015 0422   VLDL 35 04/01/2015 0422   LDLCALC 76 04/01/2015 0422      Many patients benefit from treatment even if their cholesterol is at goal.  Goal: Total Cholesterol (CHOL) less than 160  Goal:  Triglycerides (TRIG) less than 150  Goal:  HDL greater than 40  Goal:  LDL  (LDLCALC) less than 100   BLOOD PRESSURE American Stroke Association blood pressure target is less that 120/80 mm/Hg  Your discharge blood pressure is:  BP: (!) 178/81 mmHg  Monitor your blood pressure  Limit your salt and alcohol intake  Many individuals will require more than one medication for high blood pressure  DIABETES (A1c is a blood sugar average for last 3 months) Goal HGBA1c is under 7% (HBGA1c is blood sugar average for last 3 months)  Diabetes:     Lab Results  Component Value Date   HGBA1C 8.9* 04/01/2015     Your HGBA1c can be lowered with medications, healthy diet, and exercise.  Check your blood sugar as directed by your physician  Call your physician if you experience unexplained or low blood sugars.  PHYSICAL ACTIVITY/REHABILITATION Goal is 30 minutes at least 4 days per week  Activity: No driving, Therapies: See above Return to work:  N/A  Activity decreases your risk of heart attack and stroke and makes your heart stronger.  It helps control your weight and blood pressure; helps you relax and can improve your mood.  Participate in a regular exercise program.  Talk with your doctor about the best form of exercise for you (dancing, walking, swimming, cycling).  DIET/WEIGHT Goal is to maintain a healthy weight  Your discharge diet is: Diet heart healthy/carb modified Room service appropriate?: Yes; Fluid consistency:: Thin  liquids Your height is:  Height:  (154.9 cm) Your current weight is: Weight: 67.5 kg (148 lb  13 oz) Your Body Mass Index (BMI) is:  BMI (Calculated): 28.1  Following the type of diet specifically designed for you will help prevent another stroke.  Your goal weight is:    Your goal Body Mass Index (BMI) is 19-24.  Healthy food habits can help reduce 3 risk factors for stroke:  High cholesterol, hypertension, and excess weight.  RESOURCES Stroke/Support Group:  Call [phone]   STROKE EDUCATION PROVIDED/REVIEWED AND GIVEN TO  PATIENT Stroke warning signs and symptoms How to activate emergency medical system (call 911). Medications prescribed at discharge. Need for follow-up after discharge. Personal risk factors for stroke. Pneumonia vaccine given:  Flu vaccine given:  My questions have been answered, the writing is legible, and I understand these instructions.  I will adhere to these goals & educational materials that have been provided to me after my discharge from the hospital.      My questions have been answered and I understand these instructions. I will adhere to these goals and the provided educational materials after my discharge from the hospital.  Patient/Caregiver Signature _______________________________ Date __________  Clinician Signature _______________________________________ Date __________  Please bring this form and your medication list with you to all your follow-up doctor's appointments.

## 2015-04-19 NOTE — Progress Notes (Signed)
Subjective/Complaints:  Coughing doing a bit better No sweats or chills Review of systems no breathing problems no bowel or bladder issues, no joint or muscle pains.  Objective: Vital Signs: Blood pressure 107/95, pulse 92, temperature 98 F (36.7 C), temperature source Oral, resp. rate 18, height 5' 1" (1.549 m), weight 67.5 kg (148 lb 13 oz), SpO2 96 %. No results found. Results for orders placed or performed during the hospital encounter of 04/05/15 (from the past 72 hour(s))  Glucose, capillary     Status: Abnormal   Collection Time: 04/16/15 12:55 PM  Result Value Ref Range   Glucose-Capillary 107 (H) 65 - 99 mg/dL  Glucose, capillary     Status: Abnormal   Collection Time: 04/16/15  4:22 PM  Result Value Ref Range   Glucose-Capillary 160 (H) 65 - 99 mg/dL  Glucose, capillary     Status: Abnormal   Collection Time: 04/16/15  9:02 PM  Result Value Ref Range   Glucose-Capillary 117 (H) 65 - 99 mg/dL  Glucose, capillary     Status: Abnormal   Collection Time: 04/17/15  6:52 AM  Result Value Ref Range   Glucose-Capillary 146 (H) 65 - 99 mg/dL  Culture, expectorated sputum-assessment     Status: None   Collection Time: 04/17/15  1:16 PM  Result Value Ref Range   Specimen Description EXPECTORATED SPUTUM    Special Requests NORMAL    Sputum evaluation      MICROSCOPIC FINDINGS SUGGEST THAT THIS SPECIMEN IS NOT REPRESENTATIVE OF LOWER RESPIRATORY SECRETIONS. PLEASE RECOLLECT. Gram Stain Report Called to,Read Back By and Verified With: A MCCRAY,RN AT [id number] 04/17/15 BY L BENFIELD    Report Status 04/17/2015 FINAL   Glucose, capillary     Status: Abnormal   Collection Time: 04/17/15  5:32 PM  Result Value Ref Range   Glucose-Capillary 170 (H) 65 - 99 mg/dL  Glucose, capillary     Status: Abnormal   Collection Time: 04/17/15  9:46 PM  Result Value Ref Range   Glucose-Capillary 113 (H) 65 - 99 mg/dL   Comment 1 Notify RN   Glucose, capillary     Status: Abnormal    Collection Time: 04/17/15 10:26 PM  Result Value Ref Range   Glucose-Capillary 173 (H) 65 - 99 mg/dL  Creatinine, serum     Status: None   Collection Time: 04/19/15  5:35 AM  Result Value Ref Range   Creatinine, Ser 0.87 0.44 - 1.00 mg/dL   GFR calc non Af Amer >60 >60 mL/min   GFR calc Af Amer >60 >60 mL/min    Comment: (NOTE) The eGFR has been calculated using the CKD EPI equation. This calculation has not been validated in all clinical situations. eGFR's persistently <60 mL/min signify possible Chronic Kidney Disease.      HEENT: normal Cardio: RRR and no murmur Resp: CTA except expiratory wheezes,  unlabored GI: BS positive and NT, ND Extremity:  No Edema Skin:   Intact Neuro: Alert/Oriented, Abnormal Sensory reduced LT, proprio RUE, Abnormal Motor 2- R delt bi tri grip, 4/5 RLE, 5/5 on left side, Abnormal FMC Ataxic/ dec FMC, Inattention and Other R HH Musc/Skel:  Other no pain with UE or LE ROM Gen NAD   Assessment/Plan: 1. Functional deficits secondary to Right hemiparesis, Right homonymous hemianopsia, cognitive deficits related to CVA which require 3+ hours per day of interdisciplinary therapy in a comprehensive inpatient rehab setting. Physiatrist is providing close team supervision and 24 hour management of active medical problems  listed below. Physiatrist and rehab team continue to assess barriers to discharge/monitor patient progress toward functional and medical goals. Anticipate discharge in the morning FIM: Function - Bathing Position: Shower Body parts bathed by patient: Right arm, Left arm, Chest, Abdomen, Front perineal area, Buttocks, Right upper leg, Left upper leg, Right lower leg, Left lower leg Body parts bathed by helper: Back Assist Level: Supervision or verbal cues  Function- Upper Body Dressing/Undressing What is the patient wearing?: Bra, Pull over shirt/dress Bra - Perfomed by patient: Thread/unthread right bra strap, Thread/unthread left bra  strap Bra - Perfomed by helper: Hook/unhook bra (pull down sports bra) Pull over shirt/dress - Perfomed by patient: Thread/unthread right sleeve, Thread/unthread left sleeve, Put head through opening, Pull shirt over trunk Pull over shirt/dress - Perfomed by helper: Thread/unthread right sleeve, Thread/unthread left sleeve, Put head through opening, Pull shirt over trunk Assist Level: Set up Set up : To obtain clothing/put away Function - Lower Body Dressing/Undressing What is the patient wearing?: Underwear, Pants, Non-skid slipper socks, Shoes Position: Wheelchair/chair at sink Underwear - Performed by patient: Thread/unthread right underwear leg, Thread/unthread left underwear leg Underwear - Performed by helper: Pull underwear up/down Pants- Performed by patient: Thread/unthread right pants leg, Thread/unthread left pants leg, Pull pants up/down Pants- Performed by helper: Pull pants up/down Non-skid slipper socks- Performed by patient: Don/doff right sock, Don/doff left sock Non-skid slipper socks- Performed by helper: Don/doff right sock, Don/doff left sock Socks - Performed by patient: Don/doff right sock, Don/doff left sock Socks - Performed by helper: Don/doff right sock, Don/doff left sock (assisted with part of task) Shoes - Performed by patient: Don/doff right shoe, Don/doff left shoe Shoes - Performed by helper: Fasten right, Fasten left Assist for footwear: Setup Assist for lower body dressing: Touching or steadying assistance (Pt > 75%)  Function - Toileting Toileting steps completed by patient: Adjust clothing prior to toileting, Performs perineal hygiene, Adjust clothing after toileting Toileting steps completed by helper: Adjust clothing after toileting Toileting Assistive Devices: Grab bar or rail Assist level: Supervision or verbal cues  Function - Air cabin crew transfer assistive device: Elevated toilet seat/BSC over toilet, Walker Assist level to toilet:  Supervision or verbal cues Assist level from toilet: Supervision or verbal cues  Function - Chair/bed transfer Chair/bed transfer method: Ambulatory Chair/bed transfer assist level: Supervision or verbal cues Chair/bed transfer assistive device: Armrests, Walker Chair/bed transfer details: Verbal cues for safe use of DME/AE, Verbal cues for technique, Verbal cues for precautions/safety  Function - Locomotion: Wheelchair Will patient use wheelchair at discharge?: Yes Type: Manual Max wheelchair distance: 35' Assist Level: Touching or steadying assistance (Pt > 75%) Assist Level: Dependent (Pt equals 0%) Wheel 150 feet activity did not occur: Safety/medical concerns Assist Level: Dependent (Pt equals 0%) Turns around,maneuvers to table,bed, and toilet,negotiates 3% grade,maneuvers on rugs and over doorsills: No Function - Locomotion: Ambulation Assistive device: Walker-rolling Max distance: 150 Assist level: Supervision or verbal cues Walk 10 feet activity did not occur: Safety/medical concerns Assist level: Supervision or verbal cues Walk 50 feet with 2 turns activity did not occur: Safety/medical concerns Assist level: Supervision or verbal cues Walk 150 feet activity did not occur: Safety/medical concerns Assist level: Supervision or verbal cues Walk 10 feet on uneven surfaces activity did not occur: Safety/medical concerns Assist level: Touching or steadying assistance (Pt > 75%)  Function - Comprehension Comprehension: Auditory Comprehension assist level: Understands complex 90% of the time/cues 10% of the time  Function - Expression Expression: Verbal Expression  assist level: Expresses complex 90% of the time/cues < 10% of the time  Function - Social Interaction Social Interaction assist level: Interacts appropriately 90% of the time - Needs monitoring or encouragement for participation or interaction.  Function - Problem Solving Problem solving assist level: Solves  basic 75 - 89% of the time/requires cueing 10 - 24% of the time  Function - Memory Memory assist level: Recognizes or recalls 75 - 89% of the time/requires cueing 10 - 24% of the time Patient normally able to recall (first 3 days only): That he or she is in a hospital, Current season  Medical Problem List and Plan: 1.  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  DVT Prophylaxis/Anticoagulation: Pharmaceutical: Lovenox 3. Pain Management:  Tylenol prn 4. Mood: LCSW to follow for evaluation and support.   5. Neuropsych: This patient is not capable of making decisions onher own behalf. 6. Skin/Wound Care: Routine pressure relief measures. Maintain adequate nutrition and hydration status 7. Fluids/Electrolytes/Nutrition:  Monitor I/O. Check lytes in am.   8. DM type 2:   Home regimen included Invokana 300 mg, Metformin 1000 mg bid, Levemir 40 mg bid and Novolog 20-10-20 ac. Monitor BS ac/hs Currently on levimir 40U BID and Novalog 8 U with meals, CBG will go up with prednisone 9. HTN: Monitor BP every 8 hours.   Continue coreg bid.   HR 86, BP elevated at 138/55 today, monitor trend 10. Diabetic Neuropathy:  Elavil was dc due to concerns of SE--Continue lyrica. and cymbalta, no complaints of neuropathic pain today 11. Leucocytosis: . Will monitor for fevers and other signs of infection.   12. Hypokalemia:  Will supplement for now , repeat  Bmet prior to discharge  14. OSA: Does not tolerate CPAP 15. Intermittent diarrhea x few months:  due to metformin 16. COPD with chronic bronchitis/cough:lung exam normal today , WBC nl on 12/7 , afeb, increase pulmicort,cont albuterol, ad prednisone, repeat CBC and chest x-ray today   LOS (Days) 14 A FACE TO FACE EVALUATION WAS PERFORMED  , E 04/19/2015, 12:49 PM

## 2015-04-20 MED ORDER — PANTOPRAZOLE SODIUM 40 MG PO TBEC: 40.0000 mg | DELAYED_RELEASE_TABLET | Freq: Every day | ORAL | Status: DC

## 2015-04-20 MED ORDER — FLUTICASONE PROPIONATE HFA 110 MCG/ACT IN AERO: 2.0000 | INHALATION_SPRAY | Freq: Two times a day (BID) | RESPIRATORY_TRACT | Status: DC

## 2015-04-20 MED ORDER — CLOPIDOGREL BISULFATE 75 MG PO TABS: 75.0000 mg | ORAL_TABLET | Freq: Every day | ORAL | Status: DC

## 2015-04-20 MED ORDER — BUDESONIDE 0.25 MG/2ML IN SUSP: 0.2500 mg | Freq: Two times a day (BID) | RESPIRATORY_TRACT | Status: DC

## 2015-04-20 MED ORDER — CLARITHROMYCIN 250 MG PO TABS: 250.0000 mg | ORAL_TABLET | Freq: Two times a day (BID) | ORAL | Status: DC

## 2015-04-20 MED ORDER — SODIUM CHLORIDE 0.45 % IV SOLN
INTRAVENOUS | Status: AC
  Administered 2015-04-20: 11:00:00 via INTRAVENOUS

## 2015-04-20 MED ORDER — CARVEDILOL 6.25 MG PO TABS: 6.2500 mg | ORAL_TABLET | Freq: Two times a day (BID) | ORAL | Status: DC

## 2015-04-20 MED ORDER — SODIUM CHLORIDE 0.45 % IV SOLN: INTRAVENOUS | Status: DC

## 2015-04-20 MED ORDER — ATORVASTATIN CALCIUM 10 MG PO TABS: 40.0000 mg | ORAL_TABLET | Freq: Every day | ORAL | Status: DC

## 2015-04-20 MED ORDER — SACCHAROMYCES BOULARDII 250 MG PO CAPS: 250.0000 mg | ORAL_CAPSULE | Freq: Two times a day (BID) | ORAL | Status: DC

## 2015-04-20 MED ORDER — PREDNISONE 5 MG PO TABS: 10.0000 mg | ORAL_TABLET | Freq: Once | ORAL | Status: DC

## 2015-04-20 MED ORDER — ATORVASTATIN CALCIUM 10 MG PO TABS: 10.0000 mg | ORAL_TABLET | Freq: Every day | ORAL | Status: DC

## 2015-04-20 MED ORDER — IPRATROPIUM-ALBUTEROL 0.5-2.5 (3) MG/3ML IN SOLN: 3.0000 mL | Freq: Four times a day (QID) | RESPIRATORY_TRACT | Status: DC | PRN

## 2015-04-20 NOTE — Discharge Summary (Signed)
Physician Discharge Summary  Patient ID: Casey Harrell MRN: [id number] DOB/AGE: 1935-02-13 80 y.o.  Admit date: 04/05/2015 Discharge date: 04/20/2015   Discharge Diagnoses:  Principal Problem:   Hemiplegia, unspecified affecting right dominant side (HCC) Active Problems:   Hypothyroidism   Type 2 diabetes mellitus, uncontrolled (HCC)   Diabetic neuropathy (HCC)   Dyslipidemia   Essential hypertension   GERD   Sleep apnea- C-pap intol   Depression   Embolic stroke involving posterior cerebral artery [facility])   Discharged Condition: stable   Labs:  Basic Metabolic Panel:  Recent Labs Lab 04/19/15 0535 04/19/15 1515  NA  --  130*  K  --  4.7  CL  --  99*  CO2  --  18*  GLUCOSE  --  295*  BUN  --  27*  CREATININE 0.87 1.29*  CALCIUM  --  9.7    CBC:  Recent Labs Lab 04/19/15 1515  WBC 17.3*  NEUTROABS 13.8*  HGB 12.9  HCT 38.7  MCV 79.5  PLT 321    CBG:  Recent Labs Lab 04/19/15 1242 04/19/15 1658 04/19/15 2157 04/20/15 0653 04/20/15 1129  GLUCAP 232* 287* 286* 197* 156*    Brief HPI:   Casey Harrell is a 80 y.o. female with a history of dementia admitted on 11/23 with fever, 2 day history of diarrhea and confusion. MRI revealed acute infarcts involving the left periatrial white matter and left temporal-parietal region in addition to chronic lacunar and bilateral occipital infarcts. She had worsening of symptoms after syncopal episode 11/25 with resultant aphasia, right upper > lower hemiparesis most likely from hypotensive episode in setting of high grade stenosis L-M1 and/or P2 segments. Neurology felt that left punctate strokes likely embolic due to unknown source and recommended BP goal of 130-160 given MI stenosis. She is to continue on ASA and plavix X 3 months followed by Plavix alone. Patient with resultant RUE weakness with decreased balance, unsteady gait, lacks awareness and insight into deficits and mild fluent aphasia complicated by baseline  dementia. CIR recommended for follow up therapy.     Hospital Course: Casey Harrell was admitted to rehab 04/05/2015 for inpatient therapies to consist of PT, ST and OT at least three hours five days a week. Past admission physiatrist, therapy team and rehab RN have worked together to provide customized collaborative inpatient rehab. She was maintained on ASA and plavix during her stay and has tolerated this without side effects. Repeat CBC shows H/H to be stable.   She completed 5 day course of antibiotics for treatment of UTI. She is voiding without difficulty and is continent of bowel and bladder. Po intake has been good and follow up lytes revealed that hypokalemia has resolved. Blood pressures were monitored on tid basis and Cardizem was discontinued due to SBP trending below 130.  Heart rate has been controlled on lower dose of coreg bid.  She was noted to have acute renal insufficiency on follow up labs and was treated with IVF prior to discharge. She has been advised to drink at least 1500 cc daily to avoid dehydration.   She has had complaints of non-productive cough. This was managed conservatively and Pulmicort was resumed without improvement. CXR was negative initially but she had worsening of cough with follow up CXR showing mild bilateral subsegmental atelectasis.  She was started on Sterapred dose pack as well as Biaxin due to concerns of bronchitis flare. Her cough has improved on this regimen and she has been  advised to continue IS after discharge.  Follow up CBC shows leucocytosis likely due to steroid effect. Diabetes has been monitored with ac/hs CBG checks and low dose metformin was resumed briefly. She developed recurrent diarrhea therefore metformin was discontinued with resolution of symptoms.  She continues on home dose Levemir and was advised to monitor BS closely while on steroids. She is to monitor BS ac/hs basis and use home regimen SSI.   Mood has been stable and patient has shown  good motivation during her rehab stay. She has made steady progress and is at supervision to min assist level at discharge. She will continue to receive follow up HHPT, HHOT, HHST and HHRN by [facility] after discharge.    Rehab course: During patient's stay in rehab weekly team conferences were held to monitor patient's progress, set goals and discuss barriers to discharge. Patient has had improvement in activity tolerance, balance, postural control, as well as ability to compensate for deficits. At admission, patient required moderate assistance with ADL tasks and mobility. She has had improvement in functional use RUE  and RLE as well as improved awareness. She is able to c complete bathing with supervision and requires min assist to supervision for dressing. She is ambulating 150' with use of RW and supervision. She  requires supervision for complex problem solving and recall of new information.  Husband has been very supportive and has been here for multiple therapy sessions. He will continue to provide supervision to min assist after discharge.    Disposition: 06-Home-Health Care Svc   Diet: Diabetic diet. Low fat. Low cholesterol  Special Instructions: 1. Needs BMET rechecked in a week. 2. To continue ASA/Plavix 3 months post stroke followed by Plavix alone.        Discharge Instructions    Ambulatory referral to Physical Medicine Rehab    Complete by:  As directed   Stroke follow up in 4 weeks            Medication List    STOP taking these medications        amitriptyline 25 MG tablet  Commonly known as:  ELAVIL     canagliflozin 300 MG Tabs tablet  Commonly known as:  INVOKANA     insulin lispro 100 UNIT/ML KiwkPen  Commonly known as:  HUMALOG     lansoprazole 15 MG capsule  Commonly known as:  PREVACID     metFORMIN 1000 MG tablet  Commonly known as:  GLUCOPHAGE     sulfamethoxazole-trimethoprim 800-160 MG tablet  Commonly known as:  BACTRIM DS,SEPTRA  DS      TAKE these medications        albuterol 1.25 MG/3ML nebulizer solution  Commonly known as:  ACCUNEB  Take 1 ampule by nebulization every 6 (six) hours as needed for wheezing.     aspirin 81 MG tablet  Take 81 mg by mouth daily.     atorvastatin 10 MG tablet  Commonly known as:  LIPITOR  Take 1 tablet (10 mg total) by mouth daily at 6 PM.     AZELASTINE HCL OP  Apply to eye.     B-D ULTRAFINE III SHORT PEN 31G X 8 MM Misc  Generic drug:  Insulin Pen Needle  USE AS DIRECTED     budesonide 0.25 MG/2ML nebulizer solution  Commonly known as:  PULMICORT  Take 2 mLs (0.25 mg total) by nebulization 2 (two) times daily.     carvedilol 6.25 MG tablet  Commonly known  as:  COREG  Take 1 tablet (6.25 mg total) by mouth 2 (two) times daily with a meal.     CITRACAL PO  Take 1 tablet by mouth 2 (two) times daily.     clarithromycin 250 MG tablet  Commonly known as:  BIAXIN  Take 1 tablet (250 mg total) by mouth every 12 (twelve) hours.     clopidogrel 75 MG tablet  Commonly known as:  PLAVIX  Take 1 tablet (75 mg total) by mouth daily.     DULoxetine 60 MG capsule  Commonly known as:  CYMBALTA  TAKE 1 CAPSULE BY MOUTH DAILY     fluticasone 27.5 MCG/SPRAY nasal spray  Commonly known as:  VERAMYST  Place 2 sprays into the nose daily.     hydrocortisone 25 MG suppository  Commonly known as:  ANUSOL-HC  Place 1 suppository (25 mg total) rectally 2 (two) times daily.     Insulin Detemir 100 UNIT/ML Pen  Commonly known as:  LEVEMIR FLEXTOUCH  Inject 40 Units into the skin 2 (two) times daily.     ipratropium-albuterol 0.5-2.5 (3) MG/3ML Soln  Commonly known as:  DUONEB  Take 3 mLs by nebulization every 6 (six) hours as needed (shortness of breath or wheezing).     Lancets Misc     levothyroxine 50 MCG tablet  Commonly known as:  SYNTHROID, LEVOTHROID  Take 1 tablet (50 mcg total) by mouth daily before breakfast.     magnesium oxide 400 (241.3 MG) MG tablet   Commonly known as:  MAG-OX  TAKE 1 TABLET BY MOUTH DAILY     meclizine 12.5 MG tablet  Commonly known as:  ANTIVERT  Take 1 tablet (12.5 mg total) by mouth every 6 (six) hours as needed for dizziness.     NOVOLOG FLEXPEN 100 UNIT/ML FlexPen  Generic drug:  insulin aspart  INJECT 9 TO 14 UNITS UNDER THE SKIN 2 TIMES DAILY WITH A MEAL 9 UNITS BEFORE BREAKFAST AND 14 UNITS AFTER SUPPER     ondansetron 4 MG disintegrating tablet  Commonly known as:  ZOFRAN ODT  Take 1 tablet (4 mg total) by mouth every 8 (eight) hours as needed for nausea or vomiting.     pantoprazole 40 MG tablet  Commonly known as:  PROTONIX  Take 1 tablet (40 mg total) by mouth daily. TAKE 1 TABLET BY MOUTH TWICE DAILY     pregabalin 150 MG capsule  Commonly known as:  LYRICA  TAKE ONE CAPSULE BY MOUTH DAILY     saccharomyces boulardii 250 MG capsule  Commonly known as:  FLORASTOR  Take 1 capsule (250 mg total) by mouth 2 (two) times daily.     Vitamin D3 2000 UNITS Tabs  Take 2,000 Units by mouth daily.       Follow-up Information    Follow up with Erick Colace, MD.   Specialty:  Physical Medicine and Rehabilitation   Why:  office to call you with appointment   Contact information:   [address] [place] [address] [phone]       Follow up with Xu,Jindong, MD On 05/20/2015.   Specialty:  Neurology   Why:  be there at 11 am for follow up appointment   Contact information:   [address] [place] [address] [phone]       Follow up with [name], MD On 04/26/2015.   Specialty:  Family Medicine   Why:   APPT @ 1:15 PM  Contact information:   [address] Christena Flake [place] [address] [phone]       Signed: Jacquelynn Cree 04/22/2015, 3:14 PM

## 2015-04-20 NOTE — Progress Notes (Signed)
Social Work  Discharge Note  The overall goal for the admission was met for:   Discharge location: Yes-HOME WITH HUSBAND WHO CAN PROVIDE 24 HR CARE  Length of Stay: Yes-15 DAYS  Discharge activity level: Yes-SUPERVISION/MIN LEVEL  Home/community participation: Yes  Services provided included: MD, RD, PT, OT, SLP, RN, CM, TR, Pharmacy and SW  Financial Services: Private Insurance: BLUE MEDICARE  Follow-up services arranged: Home Health: [facility]-PT,OT,SP,RN, DME: [facility] IN [facility] and Patient/Family has no preference for HH/DME agencies  Comments (or additional information):HUSBAND WAS HERE EVERYDAY GOING TO [place]. HE WILL BE PROVIDING 24 HR CARE AT HOME  Patient/Family verbalized understanding of follow-up arrangements: Yes  Individual responsible for coordination of the follow-up plan: CLIFTON-HUSBAND  Confirmed correct DME delivered: Elease Hashimoto 04/20/2015    Elease Hashimoto

## 2015-04-20 NOTE — Progress Notes (Signed)
Recreational Therapy Discharge Summary Patient Details  Name: MYAH GUYNES MRN: [id number] Date of Birth: 08-20-1934 Today's Date: 04/20/2015  Long term goals set: 1  Long term goals met: 1   Comments on progress toward goals: Pt has made good progress toward goal and is ready for discharge home with husband to provide/coordinate 24 hour care.  Pt is min assist level for simple-moderate TR tasks.  Pt is motivated to regain further independence and return to previously enjoyed activities.  Reasons for discharge: discharge from hospital  Patient/family agrees with progress made and goals achieved: Yes  Saxon Crosby 04/20/2015, 3:53 PM

## 2015-04-20 NOTE — Progress Notes (Signed)
Subjective/Complaints:  Coughing doing a bit better No sweats or chills Review of systems no breathing problems no bowel or bladder issues, no joint or muscle pains.  Objective: Vital Signs: Blood pressure 182/88, pulse 74, temperature 98.3 F (36.8 C), temperature source Oral, resp. rate 18, height $RemoveBe'5\' 1"'QlndehTpP$  (1.549 m), weight 67.5 kg (148 lb 13 oz), SpO2 94 %. Dg Chest 2 View  04/19/2015  CLINICAL DATA:  Cough and wheezing for 2 weeks. EXAM: CHEST  2 VIEW COMPARISON:  04/12/2015 FINDINGS: New linear opacity seen in the medial left lung base and right midlung, consistent with subsegmental atelectasis. No evidence of pulmonary consolidation or edema. No evidence of pneumothorax or pleural effusion. Heart size is within normal limits. IMPRESSION: New mild bilateral subsegmental atelectasis. No evidence of pulmonary consolidation or edema. Electronically Signed   By: Earle Gell M.D.   On: 04/19/2015 14:05   Results for orders placed or performed during the hospital encounter of 04/05/15 (from the past 72 hour(s))  Culture, expectorated sputum-assessment     Status: None   Collection Time: 04/17/15  1:16 PM  Result Value Ref Range   Specimen Description EXPECTORATED SPUTUM    Special Requests NORMAL    Sputum evaluation      MICROSCOPIC FINDINGS SUGGEST THAT THIS SPECIMEN IS NOT REPRESENTATIVE OF LOWER RESPIRATORY SECRETIONS. PLEASE RECOLLECT. Gram Stain Report Called to,Read Back By and Verified With: A MCCRAY,RN AT [id number] 04/17/15 BY L BENFIELD    Report Status 04/17/2015 FINAL   Glucose, capillary     Status: Abnormal   Collection Time: 04/17/15  5:32 PM  Result Value Ref Range   Glucose-Capillary 170 (H) 65 - 99 mg/dL  Glucose, capillary     Status: Abnormal   Collection Time: 04/17/15  9:46 PM  Result Value Ref Range   Glucose-Capillary 113 (H) 65 - 99 mg/dL   Comment 1 Notify RN   Glucose, capillary     Status: Abnormal   Collection Time: 04/17/15 10:26 PM  Result Value Ref Range    Glucose-Capillary 173 (H) 65 - 99 mg/dL  Creatinine, serum     Status: None   Collection Time: 04/19/15  5:35 AM  Result Value Ref Range   Creatinine, Ser 0.87 0.44 - 1.00 mg/dL   GFR calc non Af Amer >60 >60 mL/min   GFR calc Af Amer >60 >60 mL/min    Comment: (NOTE) The eGFR has been calculated using the CKD EPI equation. This calculation has not been validated in all clinical situations. eGFR's persistently <60 mL/min signify possible Chronic Kidney Disease.   CBC with Differential/Platelet     Status: Abnormal   Collection Time: 04/19/15  3:15 PM  Result Value Ref Range   WBC 17.3 (H) 4.0 - 10.5 K/uL    Comment: WHITE COUNT CONFIRMED ON SMEAR   RBC 4.87 3.87 - 5.11 MIL/uL   Hemoglobin 12.9 12.0 - 15.0 g/dL   HCT 38.7 36.0 - 46.0 %   MCV 79.5 78.0 - 100.0 fL   MCH 26.5 26.0 - 34.0 pg   MCHC 33.3 30.0 - 36.0 g/dL   RDW 16.2 (H) 11.5 - 15.5 %   Platelets 321 150 - 400 K/uL    Comment: SPECIMEN CHECKED FOR CLOTS PLATELET COUNT CONFIRMED BY SMEAR    Neutrophils Relative % 80 %   Lymphocytes Relative 16 %   Monocytes Relative 4 %   Eosinophils Relative 0 %   Basophils Relative 0 %   Neutro Abs 13.8 (H) 1.7 -  7.7 K/uL   Lymphs Abs 2.8 0.7 - 4.0 K/uL   Monocytes Absolute 0.7 0.1 - 1.0 K/uL   Eosinophils Absolute 0.0 0.0 - 0.7 K/uL   Basophils Absolute 0.0 0.0 - 0.1 K/uL   Smear Review MORPHOLOGY UNREMARKABLE   Basic metabolic panel     Status: Abnormal   Collection Time: 04/19/15  3:15 PM  Result Value Ref Range   Sodium 130 (L) 135 - 145 mmol/L   Potassium 4.7 3.5 - 5.1 mmol/L   Chloride 99 (L) 101 - 111 mmol/L   CO2 18 (L) 22 - 32 mmol/L   Glucose, Bld 295 (H) 65 - 99 mg/dL   BUN 27 (H) 6 - 20 mg/dL   Creatinine, Ser 1.29 (H) 0.44 - 1.00 mg/dL   Calcium 9.7 8.9 - 10.3 mg/dL   GFR calc non Af Amer 39 (L) >60 mL/min   GFR calc Af Amer 45 (L) >60 mL/min    Comment: (NOTE) The eGFR has been calculated using the CKD EPI equation. This calculation has not been  validated in all clinical situations. eGFR's persistently <60 mL/min signify possible Chronic Kidney Disease.    Anion gap 13 5 - 15     HEENT: normal Cardio: RRR and no murmur Resp: CTA except expiratory wheezes,  unlabored GI: BS positive and NT, ND Extremity:  No Edema Skin:   Intact Neuro: Alert/Oriented, Abnormal Sensory reduced LT, proprio RUE, Abnormal Motor 3- R delt bi tri grip, 4/5 RLE, 5/5 on left side, Abnormal FMC Ataxic/ dec FMC, Inattention and Other R HH Musc/Skel:  Other no pain with UE or LE ROM Gen NAD   Assessment/Plan: 1. Functional deficits secondary to Right hemiparesis, Right homonymous hemianopsia, cognitive deficits related to CVA  Stable for D/C today F/u PCP in 1-2 weeks F/u PM&R 3 weeks See D/C summary See D/C instructions  Give IVF 291ml prior to d/c FIM: Function - Bathing Position: Shower Body parts bathed by patient: Right arm, Left arm, Chest, Abdomen, Front perineal area, Buttocks, Right upper leg, Left upper leg, Right lower leg, Left lower leg Body parts bathed by helper: Back Assist Level: Supervision or verbal cues  Function- Upper Body Dressing/Undressing What is the patient wearing?: Bra, Pull over shirt/dress Bra - Perfomed by patient: Thread/unthread right bra strap, Thread/unthread left bra strap Bra - Perfomed by helper: Hook/unhook bra (pull down sports bra) Pull over shirt/dress - Perfomed by patient: Thread/unthread right sleeve, Thread/unthread left sleeve, Put head through opening, Pull shirt over trunk Pull over shirt/dress - Perfomed by helper: Thread/unthread right sleeve, Thread/unthread left sleeve, Put head through opening, Pull shirt over trunk Assist Level: Set up Set up : To obtain clothing/put away Function - Lower Body Dressing/Undressing What is the patient wearing?: Underwear, Pants, Non-skid slipper socks, Shoes Position: Wheelchair/chair at sink Underwear - Performed by patient: Thread/unthread right  underwear leg, Thread/unthread left underwear leg Underwear - Performed by helper: Pull underwear up/down Pants- Performed by patient: Thread/unthread right pants leg, Thread/unthread left pants leg, Pull pants up/down Pants- Performed by helper: Pull pants up/down Non-skid slipper socks- Performed by patient: Don/doff right sock, Don/doff left sock Non-skid slipper socks- Performed by helper: Don/doff right sock, Don/doff left sock Socks - Performed by patient: Don/doff right sock, Don/doff left sock Socks - Performed by helper: Don/doff right sock, Don/doff left sock (assisted with part of task) Shoes - Performed by patient: Don/doff right shoe, Don/doff left shoe Shoes - Performed by helper: Fasten right, Fasten left Assist for footwear:  Setup Assist for lower body dressing: Touching or steadying assistance (Pt > 75%)  Function - Toileting Toileting steps completed by patient: Adjust clothing prior to toileting, Performs perineal hygiene, Adjust clothing after toileting Toileting steps completed by helper: Adjust clothing after toileting Toileting Assistive Devices: Grab bar or rail Assist level: Supervision or verbal cues  Function - Toilet Transfers Toilet transfer assistive device: Elevated toilet seat/BSC over toilet, Walker Assist level to toilet: Supervision or verbal cues Assist level from toilet: Supervision or verbal cues  Function - Chair/bed transfer Chair/bed transfer method: Ambulatory Chair/bed transfer assist level: Supervision or verbal cues Chair/bed transfer assistive device: Armrests, Walker Chair/bed transfer details: Verbal cues for safe use of DME/AE, Verbal cues for technique, Verbal cues for precautions/safety  Function - Locomotion: Wheelchair Will patient use wheelchair at discharge?: Yes Type: Manual Max wheelchair distance: 35' Assist Level: Touching or steadying assistance (Pt > 75%) Assist Level: Dependent (Pt equals 0%) Wheel 150 feet activity did  not occur: Safety/medical concerns Assist Level: Dependent (Pt equals 0%) Turns around,maneuvers to table,bed, and toilet,negotiates 3% grade,maneuvers on rugs and over doorsills: No Function - Locomotion: Ambulation Assistive device: Walker-rolling Max distance: 150 Assist level: Supervision or verbal cues Walk 10 feet activity did not occur: Safety/medical concerns Assist level: Supervision or verbal cues Walk 50 feet with 2 turns activity did not occur: Safety/medical concerns Assist level: Supervision or verbal cues Walk 150 feet activity did not occur: Safety/medical concerns Assist level: Supervision or verbal cues Walk 10 feet on uneven surfaces activity did not occur: Safety/medical concerns Assist level: Touching or steadying assistance (Pt > 75%)  Function - Comprehension Comprehension: Auditory Comprehension assist level: Understands complex 90% of the time/cues 10% of the time  Function - Expression Expression: Verbal Expression assist level: Expresses complex 90% of the time/cues < 10% of the time  Function - Social Interaction Social Interaction assist level: Interacts appropriately 90% of the time - Needs monitoring or encouragement for participation or interaction.  Function - Problem Solving Problem solving assist level: Solves complex 90% of the time/cues < 10% of the time  Function - Memory Memory assist level: Recognizes or recalls 75 - 89% of the time/requires cueing 10 - 24% of the time Patient normally able to recall (first 3 days only): That he or she is in a hospital, Current season  Medical Problem List and Plan: 1.  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  DVT Prophylaxis/Anticoagulation: Pharmaceutical: Lovenox 3. Pain Management:  Tylenol prn 4. Mood: LCSW to follow for evaluation and support.   5. Neuropsych: This patient is not capable of making decisions onher  own behalf. 6. Skin/Wound Care: Routine pressure relief measures. Maintain adequate nutrition and hydration status 7. Fluids/Electrolytes/Nutrition:  Monitor I/O. Check lytes in am.   8. DM type 2:   Home regimen included Invokana 300 mg, Metformin 1000 mg bid, Levemir 40 mg bid and Novolog 20-10-20 ac. Monitor BS ac/hs Currently on levimir 40U BID and Novalog 8 U with meals, CBG will go up with prednisone 9. HTN: Monitor BP every 8 hours.   Continue coreg bid.   HR 86, BP elevated at 138/55 today, monitor trend 10. Diabetic Neuropathy:  Elavil was dc due to concerns of SE--Continue lyrica. and cymbalta, no complaints of neuropathic pain today 11. Leucocytosis: . Will monitor for fevers and other signs of infection.   12. Hypokalemia:  Will supplement for now , repeat  Bmet prior  to discharge  14. OSA: Does not tolerate CPAP 15. Intermittent diarrhea x few months:  due to metformin 16. COPD with chronic bronchitis/cough:lung exam normal today , WBC nl on 12/7 , afeb, increase pulmicort,cont albuterol, ad prednisone, repeat CBC and chest x-ray today 17.  Leukocytosis- steroid induced  LOS (Days) 15 A FACE TO FACE EVALUATION WAS PERFORMED  KIRSTEINS,ANDREW E 04/20/2015, 9:00 AM

## 2015-04-21 ENCOUNTER — Telehealth: Payer: Self-pay | Admitting: Cardiovascular Disease

## 2015-04-21 ENCOUNTER — Telehealth: Payer: Self-pay | Admitting: Neurology

## 2015-04-21 LAB — GLUCOSE, CAPILLARY
Glucose-Capillary: 74 mg/dL (ref 65–99)
Glucose-Capillary: 142 mg/dL — ABNORMAL HIGH (ref 65–99)
Glucose-Capillary: 358 mg/dL — ABNORMAL HIGH (ref 65–99)
Glucose-Capillary: 344 mg/dL — ABNORMAL HIGH (ref 65–99)
Glucose-Capillary: 227 mg/dL — ABNORMAL HIGH (ref 65–99)
Glucose-Capillary: 232 mg/dL — ABNORMAL HIGH (ref 65–99)
Glucose-Capillary: 287 mg/dL — ABNORMAL HIGH (ref 65–99)
Glucose-Capillary: 286 mg/dL — ABNORMAL HIGH (ref 65–99)
Glucose-Capillary: 197 mg/dL — ABNORMAL HIGH (ref 65–99)
Glucose-Capillary: 156 mg/dL — ABNORMAL HIGH (ref 65–99)

## 2015-04-21 NOTE — Telephone Encounter (Signed)
Dr. Roda ShuttersXu w/ [facility] saw patient in hospital. He had mentioned starting patient on different medication temporarily but patient was not started on anything before leaving the hospital.  Her neurology follow up is in January. I gave number to [facility] and recommended to call to find out their advice. Advice acknowledged by caller.

## 2015-04-21 NOTE — Telephone Encounter (Signed)
[place] want her to be on a different blood thinner.Please call daughter,she will give you all the details.

## 2015-04-21 NOTE — Telephone Encounter (Signed)
Pt's daughter called and says thank you but she has everything worked out.

## 2015-04-21 NOTE — Telephone Encounter (Signed)
Daughter Casey Harrell called to advise her mother was instructed to take blood thinners for several weeks and states her father said that they weren't given Rx for this. I advised daughter to call back to the unit Mother was on at hospital so they can advise her as we are unable to advise until we see at New Patient visit.

## 2015-04-21 NOTE — Telephone Encounter (Signed)
LFt message for patients daughter Babette Relicammy to review her moms discharge papers to see what was prescribed for blood thinners. [facility] will be a [facility] follow up. Medication was not prescribed by Dr.Xu or Dr.Sethi.

## 2015-04-21 NOTE — Telephone Encounter (Signed)
Rn call [facility] and the plavix is on file when the other one runs out.Genne stated the medication will be ready when the other one is completed. See note on 04/21/2015 daughter call back and she has everything work out.

## 2015-04-26 ENCOUNTER — Ambulatory Visit (INDEPENDENT_AMBULATORY_CARE_PROVIDER_SITE_OTHER): Payer: Medicare Other | Admitting: Family Medicine

## 2015-04-26 ENCOUNTER — Encounter: Payer: Self-pay | Admitting: Family Medicine

## 2015-04-26 VITALS — BP 116/62 | Temp 97.9°F | Ht 61.0 in | Wt 144.1 lb

## 2015-04-26 DIAGNOSIS — E785 Hyperlipidemia, unspecified: Secondary | ICD-10-CM | POA: Diagnosis not present

## 2015-04-26 DIAGNOSIS — E1100 Type 2 diabetes mellitus with hyperosmolarity without nonketotic hyperglycemic-hyperosmolar coma (NKHHC): Secondary | ICD-10-CM

## 2015-04-26 DIAGNOSIS — G8191 Hemiplegia, unspecified affecting right dominant side: Secondary | ICD-10-CM | POA: Diagnosis not present

## 2015-04-26 DIAGNOSIS — I10 Essential (primary) hypertension: Secondary | ICD-10-CM | POA: Diagnosis not present

## 2015-04-26 DIAGNOSIS — N183 Chronic kidney disease, stage 3 unspecified: Secondary | ICD-10-CM

## 2015-04-26 DIAGNOSIS — Z794 Long term (current) use of insulin: Secondary | ICD-10-CM

## 2015-04-26 LAB — BASIC METABOLIC PANEL
BUN: 33 mg/dL — ABNORMAL HIGH (ref 6–23)
CO2: 27 mEq/L (ref 19–32)
Calcium: 10 mg/dL (ref 8.4–10.5)
Chloride: 98 mEq/L (ref 96–112)
Creatinine, Ser: 1.07 mg/dL (ref 0.40–1.20)
GFR: 52.71 mL/min — ABNORMAL LOW (ref >60.00)
Glucose, Bld: 67 mg/dL — ABNORMAL LOW (ref 70–99)
Potassium: 4 mEq/L (ref 3.5–5.1)
Sodium: 136 mEq/L (ref 135–145)

## 2015-04-26 NOTE — Patient Instructions (Signed)
Continue with aspirin and Plavix for now Monitor blood sugars closely- be in touch if blood sugars continue to run under 60.  Reduce Novolog to 5 units with breakfast and 8 units with supper.

## 2015-04-26 NOTE — Progress Notes (Signed)
Pre visit review using our clinic review tool, if applicable. No additional management support is needed unless otherwise documented below in the visit note. 

## 2015-04-26 NOTE — Progress Notes (Signed)
Subjective:    Patient ID: Casey Harrell, female    DOB: Jan 27, 1935, 80 y.o.   MRN: [id number]  HPI Hospital follow-up for recent CVA.  Patient admitted on 04/05/2015 and discharged 04/20/2015 She has multiple chronic problems and prior to admission and apparently developed some fever along with diarrhea and confusion. MRI revealed acute infarcts left periatrial white matter and left temporal/parietal region along with some chronic lacunar and bilateral occipital infarcts. She had developed some right upper greater than left extremity hemiparesis along with aphasia  Patient was taking aspirin prior to admission and had addition of Plavix with plan for 3 months of combined therapy and then follow-up Plavix alone. She had extensive rehabilitation with physical therapy, speech therapy, occupational therapy and has had great recovery  Other issues included treatment of UTI, mild hypokalemia, bronchial infection which was treated with steroids and antibiotic. Respiratory symptoms stable at this time. She had mild hyponatremia and mild bump in creatinine during hospitalization. Recommendation for follow-up basic metabolic panel. Discontinuation of Invokana.  Her simvastatin was changed to atorvastatin.  Blood sugars have actually declined since she has returned home. She is back on metformin and diarrhea fully resolved. Currently taking Levemir 30 units twice daily which she reduced on her own along with NovoLog with breakfast and supper. She's had couple readings in the 40s since discharge  Past Medical History  Diagnosis Date  . DIABETES-TYPE 2 07/15/2008  . DIABETIC PERIPHERAL NEUROPATHY 09/02/2009  . HYPERTENSION 07/15/2008  . ASTHMA 07/15/2008  . GERD 07/15/2008  . PVD (peripheral vascular disease) (HCC) 06/18/09    RCE  . Normal cardiac stress test     low risk Nuc 2009, Feb 2011  . COPD (chronic obstructive pulmonary disease) (HCC)   . Vertigo    Past Surgical History  Procedure Laterality  Date  . Appendectomy  1971  . Abdominal hysterectomy  1971  . Tonsillectomy    . Tubal ligation    . Cesarean section    . Flexible sigmoidoscopy      diverticulitis  . Laparotomy      X 3 with adhesions lysis  . Foot surgery      right foot X 2  . Carotid endarterectomy  06/18/09    RCE    reports that she has never smoked. She has never used smokeless tobacco. She reports that she does not drink alcohol. Her drug history is not on file. family history includes Arthritis in her mother; Asthma in her sister; Cancer in her paternal grandfather; Cancer (age of onset: 61) in her brother; Celiac disease in her sister; Diabetes in her mother; Heart disease in her brother, father, mother, sister, and sister; Stroke in her maternal grandfather. Allergies  Allergen Reactions  . Doxycycline Hyclate Nausea And Vomiting  . Morphine Sulfate Other (See Comments)    REACTION: hallucinations  . Iohexol Hives     Code: HIVES, Desc: pt. states she breaks out in hives 06/15/08   . Moxifloxacin Other (See Comments)    REACTION: unknown      Review of Systems  Constitutional: Negative for fatigue.  Eyes: Negative for visual disturbance.  Respiratory: Negative for cough, chest tightness, shortness of breath and wheezing.   Cardiovascular: Negative for chest pain, palpitations and leg swelling.  Gastrointestinal: Negative for abdominal pain.  Endocrine: Negative for polydipsia and polyuria.  Genitourinary: Negative for dysuria.  Skin: Negative for rash.  Neurological: Negative for dizziness, seizures, syncope, weakness, light-headedness and headaches.  Psychiatric/Behavioral: Negative for  confusion.       Objective:   Physical Exam  Constitutional: She is oriented to person, place, and time. She appears well-developed and well-nourished.  HENT:  Head: Normocephalic and atraumatic.  Tongue is dry in appearance otherwise clear  Neck: Neck supple. No thyromegaly present.  Cardiovascular:  Normal rate and regular rhythm.   Pulmonary/Chest: Effort normal and breath sounds normal. No respiratory distress. She has no wheezes. She has no rales.  Musculoskeletal: She exhibits no edema.  Neurological: She is alert and oriented to person, place, and time. No cranial nerve deficit.  She has good grip strength bilaterally Only very slightly diminished right upper extremity strength compared to the left. Symmetric reflexes. Ambulating with walker without assistance  Psychiatric: She has a normal mood and affect. Her behavior is normal.          Assessment & Plan:  #1 recent acute CVA with right hemi-plegia improving. Continue home physical therapy, occupational therapy, speech therapy. Continue combine aspirin and Plavix for 3 months followed by Plavix alone. Continue follow-up with neurology as scheduled #2 hyperlipidemia. Recent change from simvastatin to atorvastatin. Will plan repeat lipids at follow-up. Tolerating well #3 type 2 diabetes. History of poor control. Recent hypoglycemia. Continue Levemir 30 units twice daily and reduce her NovoLog to 5 units with breakfast and 8 units with supper and close monitoring Continue to hold Invokana for now with history of recent UTI #4 history of hypothyroidism #5 hypertension very well controlled today #6 chronic kidney disease stage III. Recent check basic metabolic panel #7 polypharmacy-keep off amitriptyline. We reviewed potential side effects of TCAs including hypotension and increased risk of arrhythmias. She has been on this for several years. Her neuropathy pains are currently stable on Cymbalta and Lyrica

## 2015-04-27 ENCOUNTER — Other Ambulatory Visit: Payer: Self-pay | Admitting: Physical Medicine and Rehabilitation

## 2015-04-27 NOTE — Telephone Encounter (Signed)
Rx for coreg was called to local pharmacy.  This might be changed on follow up and will not Rx 3 month supply. This will have to come from primary MD.

## 2015-04-28 ENCOUNTER — Other Ambulatory Visit: Payer: Self-pay | Admitting: Family Medicine

## 2015-05-03 ENCOUNTER — Other Ambulatory Visit: Payer: Self-pay | Admitting: Physical Medicine and Rehabilitation

## 2015-05-04 ENCOUNTER — Telehealth: Payer: Self-pay | Admitting: Family Medicine

## 2015-05-07 ENCOUNTER — Encounter (HOSPITAL_COMMUNITY): Payer: Self-pay | Admitting: *Deleted

## 2015-05-07 ENCOUNTER — Inpatient Hospital Stay (HOSPITAL_COMMUNITY)
Admission: EM | Admit: 2015-05-07 | Discharge: 2015-05-13 | DRG: 389 | Disposition: A | Discharge status: Home Health | Payer: Medicare Other | Attending: Internal Medicine | Admitting: Internal Medicine

## 2015-05-07 ENCOUNTER — Emergency Department (HOSPITAL_COMMUNITY): Payer: Medicare Other

## 2015-05-07 ENCOUNTER — Telehealth: Payer: Self-pay | Admitting: Family Medicine

## 2015-05-07 DIAGNOSIS — IMO0002 Reserved for concepts with insufficient information to code with codable children: Secondary | ICD-10-CM | POA: Diagnosis present

## 2015-05-07 DIAGNOSIS — Z7902 Long term (current) use of antithrombotics/antiplatelets: Secondary | ICD-10-CM | POA: Diagnosis not present

## 2015-05-07 DIAGNOSIS — I10 Essential (primary) hypertension: Secondary | ICD-10-CM | POA: Diagnosis not present

## 2015-05-07 DIAGNOSIS — Z8379 Family history of other diseases of the digestive system: Secondary | ICD-10-CM | POA: Diagnosis not present

## 2015-05-07 DIAGNOSIS — Z833 Family history of diabetes mellitus: Secondary | ICD-10-CM

## 2015-05-07 DIAGNOSIS — J45909 Unspecified asthma, uncomplicated: Secondary | ICD-10-CM | POA: Diagnosis present

## 2015-05-07 DIAGNOSIS — E1142 Type 2 diabetes mellitus with diabetic polyneuropathy: Secondary | ICD-10-CM | POA: Diagnosis present

## 2015-05-07 DIAGNOSIS — Z801 Family history of malignant neoplasm of trachea, bronchus and lung: Secondary | ICD-10-CM

## 2015-05-07 DIAGNOSIS — Z8249 Family history of ischemic heart disease and other diseases of the circulatory system: Secondary | ICD-10-CM

## 2015-05-07 DIAGNOSIS — R109 Unspecified abdominal pain: Secondary | ICD-10-CM | POA: Diagnosis not present

## 2015-05-07 DIAGNOSIS — E1100 Type 2 diabetes mellitus with hyperosmolarity without nonketotic hyperglycemic-hyperosmolar coma (NKHHC): Secondary | ICD-10-CM | POA: Diagnosis not present

## 2015-05-07 DIAGNOSIS — I119 Hypertensive heart disease without heart failure: Secondary | ICD-10-CM | POA: Diagnosis present

## 2015-05-07 DIAGNOSIS — R1032 Left lower quadrant pain: Secondary | ICD-10-CM | POA: Diagnosis not present

## 2015-05-07 DIAGNOSIS — Z883 Allergy status to other anti-infective agents status: Secondary | ICD-10-CM

## 2015-05-07 DIAGNOSIS — R911 Solitary pulmonary nodule: Secondary | ICD-10-CM | POA: Diagnosis not present

## 2015-05-07 DIAGNOSIS — R1084 Generalized abdominal pain: Secondary | ICD-10-CM | POA: Diagnosis not present

## 2015-05-07 DIAGNOSIS — E876 Hypokalemia: Secondary | ICD-10-CM | POA: Diagnosis not present

## 2015-05-07 DIAGNOSIS — B961 Klebsiella pneumoniae [K. pneumoniae] as the cause of diseases classified elsewhere: Secondary | ICD-10-CM | POA: Diagnosis present

## 2015-05-07 DIAGNOSIS — Z825 Family history of asthma and other chronic lower respiratory diseases: Secondary | ICD-10-CM | POA: Diagnosis not present

## 2015-05-07 DIAGNOSIS — K56609 Unspecified intestinal obstruction, unspecified as to partial versus complete obstruction: Secondary | ICD-10-CM

## 2015-05-07 DIAGNOSIS — Z794 Long term (current) use of insulin: Secondary | ICD-10-CM | POA: Diagnosis not present

## 2015-05-07 DIAGNOSIS — Z7982 Long term (current) use of aspirin: Secondary | ICD-10-CM | POA: Diagnosis not present

## 2015-05-07 DIAGNOSIS — N390 Urinary tract infection, site not specified: Secondary | ICD-10-CM | POA: Diagnosis present

## 2015-05-07 DIAGNOSIS — Z888 Allergy status to other drugs, medicaments and biological substances status: Secondary | ICD-10-CM | POA: Diagnosis not present

## 2015-05-07 DIAGNOSIS — E039 Hypothyroidism, unspecified: Secondary | ICD-10-CM | POA: Diagnosis present

## 2015-05-07 DIAGNOSIS — I69351 Hemiplegia and hemiparesis following cerebral infarction affecting right dominant side: Secondary | ICD-10-CM

## 2015-05-07 DIAGNOSIS — I69398 Other sequelae of cerebral infarction: Secondary | ICD-10-CM

## 2015-05-07 DIAGNOSIS — H53461 Homonymous bilateral field defects, right side: Secondary | ICD-10-CM

## 2015-05-07 DIAGNOSIS — Z885 Allergy status to narcotic agent status: Secondary | ICD-10-CM

## 2015-05-07 DIAGNOSIS — Z823 Family history of stroke: Secondary | ICD-10-CM | POA: Diagnosis not present

## 2015-05-07 DIAGNOSIS — K56600 Partial intestinal obstruction, unspecified as to cause: Secondary | ICD-10-CM

## 2015-05-07 DIAGNOSIS — E1140 Type 2 diabetes mellitus with diabetic neuropathy, unspecified: Secondary | ICD-10-CM | POA: Insufficient documentation

## 2015-05-07 DIAGNOSIS — E1151 Type 2 diabetes mellitus with diabetic peripheral angiopathy without gangrene: Secondary | ICD-10-CM | POA: Diagnosis present

## 2015-05-07 DIAGNOSIS — Z9851 Tubal ligation status: Secondary | ICD-10-CM

## 2015-05-07 DIAGNOSIS — E1169 Type 2 diabetes mellitus with other specified complication: Secondary | ICD-10-CM | POA: Diagnosis not present

## 2015-05-07 DIAGNOSIS — Z8673 Personal history of transient ischemic attack (TIA), and cerebral infarction without residual deficits: Secondary | ICD-10-CM

## 2015-05-07 DIAGNOSIS — E1165 Type 2 diabetes mellitus with hyperglycemia: Secondary | ICD-10-CM | POA: Diagnosis present

## 2015-05-07 DIAGNOSIS — I69315 Cognitive social or emotional deficit following cerebral infarction: Secondary | ICD-10-CM

## 2015-05-07 DIAGNOSIS — K5669 Other intestinal obstruction: Principal | ICD-10-CM | POA: Diagnosis present

## 2015-05-07 DIAGNOSIS — Z90710 Acquired absence of both cervix and uterus: Secondary | ICD-10-CM

## 2015-05-07 DIAGNOSIS — J449 Chronic obstructive pulmonary disease, unspecified: Secondary | ICD-10-CM | POA: Diagnosis present

## 2015-05-07 LAB — CBC
HCT: 39.2 % (ref 36.0–46.0)
Hemoglobin: 12.4 g/dL (ref 12.0–15.0)
MCH: 25.7 pg — ABNORMAL LOW (ref 26.0–34.0)
MCHC: 31.6 g/dL (ref 30.0–36.0)
MCV: 81.3 fL (ref 78.0–100.0)
Platelets: 188 10*3/uL (ref 150–400)
RBC: 4.82 MIL/uL (ref 3.87–5.11)
RDW: 17.5 % — ABNORMAL HIGH (ref 11.5–15.5)
WBC: 9.6 10*3/uL (ref 4.0–10.5)

## 2015-05-07 LAB — BASIC METABOLIC PANEL
Anion gap: 12 (ref 5–15)
BUN: 20 mg/dL (ref 6–20)
CO2: 21 mmol/L — ABNORMAL LOW (ref 22–32)
Calcium: 9.5 mg/dL (ref 8.9–10.3)
Chloride: 102 mmol/L (ref 101–111)
Creatinine, Ser: 0.95 mg/dL (ref 0.44–1.00)
GFR calc Af Amer: >60 mL/min (ref >60)
GFR calc non Af Amer: 56 mL/min — ABNORMAL LOW (ref >60)
Glucose, Bld: 201 mg/dL — ABNORMAL HIGH (ref 65–99)
Potassium: 4.4 mmol/L (ref 3.5–5.1)
Sodium: 135 mmol/L (ref 135–145)

## 2015-05-07 LAB — URINALYSIS, ROUTINE W REFLEX MICROSCOPIC
Bilirubin Urine: NEGATIVE
Glucose, UA: NEGATIVE mg/dL
Hgb urine dipstick: NEGATIVE
Ketones, ur: NEGATIVE mg/dL
Nitrite: NEGATIVE
Protein, ur: 30 mg/dL — AB
Specific Gravity, Urine: 1.017 (ref 1.005–1.030)
pH: 6 (ref 5.0–8.0)

## 2015-05-07 LAB — URINE MICROSCOPIC-ADD ON

## 2015-05-07 LAB — I-STAT TROPONIN, ED: Troponin i, poc: 0.01 ng/mL (ref 0.00–0.08)

## 2015-05-07 LAB — GLUCOSE, CAPILLARY: Glucose-Capillary: 133 mg/dL — ABNORMAL HIGH (ref 65–99)

## 2015-05-07 LAB — CBG MONITORING, ED
Glucose-Capillary: 190 mg/dL — ABNORMAL HIGH (ref 65–99)
Glucose-Capillary: 99 mg/dL (ref 65–99)

## 2015-05-07 MED ORDER — SODIUM CHLORIDE 0.9 % IV BOLUS (SEPSIS)
1000.0000 mL | Freq: Once | INTRAVENOUS | Status: AC
  Administered 2015-05-07: 1000 mL via INTRAVENOUS

## 2015-05-07 MED ORDER — LEVOTHYROXINE SODIUM 100 MCG IV SOLR
25.0000 ug | Freq: Every day | INTRAVENOUS | Status: DC
  Administered 2015-05-07 – 2015-05-12 (×6): 25 ug via INTRAVENOUS
  Filled 2015-05-07 (×6): qty 5

## 2015-05-07 MED ORDER — ONDANSETRON HCL 4 MG/2ML IJ SOLN
4.0000 mg | INTRAMUSCULAR | Status: AC
  Administered 2015-05-07: 4 mg via INTRAVENOUS
  Filled 2015-05-07: qty 2

## 2015-05-07 MED ORDER — ONDANSETRON HCL 4 MG/2ML IJ SOLN
4.0000 mg | Freq: Four times a day (QID) | INTRAMUSCULAR | Status: DC | PRN
  Administered 2015-05-09 – 2015-05-10 (×2): 4 mg via INTRAVENOUS
  Filled 2015-05-07 (×2): qty 2

## 2015-05-07 MED ORDER — DIPHENHYDRAMINE HCL 50 MG/ML IJ SOLN
12.5000 mg | Freq: Once | INTRAMUSCULAR | Status: AC
  Administered 2015-05-07: 12.5 mg via INTRAVENOUS
  Filled 2015-05-07: qty 1

## 2015-05-07 MED ORDER — HYDROMORPHONE HCL 1 MG/ML IJ SOLN
0.5000 mg | Freq: Once | INTRAMUSCULAR | Status: AC
  Administered 2015-05-07: 0.5 mg via INTRAVENOUS
  Filled 2015-05-07: qty 1

## 2015-05-07 MED ORDER — SODIUM CHLORIDE 0.9 % IV SOLN
INTRAVENOUS | Status: DC
  Administered 2015-05-07 – 2015-05-10 (×4): via INTRAVENOUS

## 2015-05-07 MED ORDER — INSULIN ASPART 100 UNIT/ML ~~LOC~~ SOLN
0.0000 [IU] | SUBCUTANEOUS | Status: DC
  Administered 2015-05-07 – 2015-05-08 (×4): 2 [IU] via SUBCUTANEOUS
  Administered 2015-05-08 (×2): 3 [IU] via SUBCUTANEOUS
  Administered 2015-05-09: 2 [IU] via SUBCUTANEOUS
  Administered 2015-05-09: 8 [IU] via SUBCUTANEOUS
  Administered 2015-05-09: 2 [IU] via SUBCUTANEOUS
  Administered 2015-05-09: 3 [IU] via SUBCUTANEOUS
  Administered 2015-05-10 (×2): 2 [IU] via SUBCUTANEOUS
  Administered 2015-05-10 (×2): 3 [IU] via SUBCUTANEOUS
  Administered 2015-05-10 – 2015-05-11 (×3): 2 [IU] via SUBCUTANEOUS
  Administered 2015-05-11: 5 [IU] via SUBCUTANEOUS
  Administered 2015-05-11: 3 [IU] via SUBCUTANEOUS
  Administered 2015-05-11: 5 [IU] via SUBCUTANEOUS
  Administered 2015-05-11: 2 [IU] via SUBCUTANEOUS
  Administered 2015-05-12: 3 [IU] via SUBCUTANEOUS
  Administered 2015-05-12: 2 [IU] via SUBCUTANEOUS
  Administered 2015-05-12: 5 [IU] via SUBCUTANEOUS
  Administered 2015-05-12: 2 [IU] via SUBCUTANEOUS
  Administered 2015-05-12 – 2015-05-13 (×3): 3 [IU] via SUBCUTANEOUS
  Administered 2015-05-13: 2 [IU] via SUBCUTANEOUS

## 2015-05-07 MED ORDER — ENOXAPARIN SODIUM 40 MG/0.4ML ~~LOC~~ SOLN
40.0000 mg | SUBCUTANEOUS | Status: DC
  Administered 2015-05-07 – 2015-05-12 (×6): 40 mg via SUBCUTANEOUS
  Filled 2015-05-07 (×6): qty 0.4

## 2015-05-07 MED ORDER — DEXTROSE 5 % IV SOLN
1.0000 g | INTRAVENOUS | Status: DC
  Administered 2015-05-07 – 2015-05-11 (×5): 1 g via INTRAVENOUS
  Filled 2015-05-07 (×6): qty 10

## 2015-05-07 MED ORDER — SODIUM CHLORIDE 0.9 % IJ SOLN
3.0000 mL | Freq: Two times a day (BID) | INTRAMUSCULAR | Status: DC
  Administered 2015-05-11 – 2015-05-12 (×2): 3 mL via INTRAVENOUS

## 2015-05-07 MED ORDER — METOPROLOL TARTRATE 1 MG/ML IV SOLN: 2.5000 mg | Freq: Four times a day (QID) | INTRAVENOUS | Status: DC | PRN

## 2015-05-07 MED ORDER — ONDANSETRON HCL 4 MG PO TABS: 4.0000 mg | ORAL_TABLET | Freq: Four times a day (QID) | ORAL | Status: DC | PRN

## 2015-05-07 NOTE — ED Provider Notes (Signed)
CSN: [id number]     Arrival date & time 05/07/15  1249 History   First MD Initiated Contact with Patient 05/07/15 1421     Chief Complaint  Patient presents with  . Weakness  . Constipation    HPI   Casey Harrell is a 80 y.o. female with a PMH of DM, HTN, COPD who presents to the ED with generalized weakness, nausea, vomiting, and abdominal pain, which she states started today prior to arrival. She reports constant, non-radiating, central abdominal pain. She reports nausea and one episode of emesis. She denies hematemesis. She also reports constipation, and states she has not had a bowel movement in 3 days, which is unusual for her. In addition, she notes dysuria over the past few days. She denies fever, chills, chest pain, shortness of breath. She states earlier this morning, she experienced numbness in her arms and legs. She denies weakness or paresthesia.   Past Medical History  Diagnosis Date  . DIABETES-TYPE 2 07/15/2008  . DIABETIC PERIPHERAL NEUROPATHY 09/02/2009  . HYPERTENSION 07/15/2008  . ASTHMA 07/15/2008  . GERD 07/15/2008  . PVD (peripheral vascular disease) (HCC) 06/18/09    RCE  . Normal cardiac stress test     low risk Nuc 2009, Feb 2011  . COPD (chronic obstructive pulmonary disease) (HCC)   . Vertigo    Past Surgical History  Procedure Laterality Date  . Appendectomy  1971  . Abdominal hysterectomy  1971  . Tonsillectomy    . Tubal ligation    . Cesarean section    . Flexible sigmoidoscopy      diverticulitis  . Laparotomy      X 3 with adhesions lysis  . Foot surgery      right foot X 2  . Carotid endarterectomy  06/18/09    RCE   Family History  Problem Relation Age of Onset  . Diabetes Mother   . Arthritis Mother   . Heart disease Mother   . Heart disease Father   . Celiac disease Sister   . Stroke Maternal Grandfather   . Cancer Paternal Grandfather     lung  . Heart disease Sister   . Heart disease Sister   . Asthma Sister   . Heart disease  Brother   . Cancer Brother 65    lung cancer   Social History  Substance Use Topics  . Smoking status: Never Smoker   . Smokeless tobacco: Never Used  . Alcohol Use: No   OB History    No data available      Review of Systems  Constitutional: Negative for fever and chills.  Respiratory: Negative for shortness of breath.   Cardiovascular: Negative for chest pain.  Gastrointestinal: Positive for abdominal pain and constipation. Negative for nausea, vomiting, diarrhea and blood in stool.  Genitourinary: Positive for dysuria. Negative for urgency and frequency.  Neurological: Positive for weakness.  All other systems reviewed and are negative.     Allergies  Doxycycline hyclate; Morphine sulfate; Iohexol; and Moxifloxacin  Home Medications   Prior to Admission medications   Medication Sig Start Date End Date Taking? Authorizing Provider  albuterol (ACCUNEB) 1.25 MG/3ML nebulizer solution Take 1 ampule by nebulization every 6 (six) hours as needed for wheezing. Reported on 04/26/2015   Yes Historical Provider, MD  aspirin 81 MG tablet Take 81 mg by mouth daily.     Yes Historical Provider, MD  atorvastatin (LIPITOR) 40 MG tablet TAKE 1 TABLET BY MOUTH EVERY DAY  AT 6 PM 04/04/15  Yes Historical Provider, MD  budesonide (PULMICORT) 0.25 MG/2ML nebulizer solution Take 2 mLs (0.25 mg total) by nebulization 2 (two) times daily. 04/20/15  Yes Evlyn Kanner Love, PA-C  Calcium Citrate (CITRACAL PO) Take 1 tablet by mouth 2 (two) times daily. Reported on 04/26/2015   Yes Historical Provider, MD  carvedilol (COREG) 6.25 MG tablet TAKE 1 TABLET (6.25 MG TOTAL) BY MOUTH 2 (TWO) TIMES DAILY WITH A MEAL. 04/27/15  Yes Evlyn Kanner Love, PA-C  Cholecalciferol (VITAMIN D3) 2000 UNITS TABS Take 2,000 Units by mouth daily. Reported on 04/26/2015   Yes Historical Provider, MD  clopidogrel (PLAVIX) 75 MG tablet Take 1 tablet (75 mg total) by mouth daily. 04/20/15  Yes Evlyn Kanner Love, PA-C  DULoxetine  (CYMBALTA) 60 MG capsule TAKE 1 CAPSULE BY MOUTH DAILY 01/15/15  Yes [name], MD  fluticasone (VERAMYST) 27.5 MCG/SPRAY nasal spray Place 2 sprays into the nose daily. Reported on 04/26/2015   Yes Historical Provider, MD  HUMALOG KWIKPEN 100 UNIT/ML KiwkPen INJECT 9-14 UNITS SUB-Q TWICE A DAY WITH A MEAL. 9U BEFORE BREAKFAST AND 14 UNITS AFTER SUPPER 04/21/15  Yes Historical Provider, MD  Insulin Detemir (LEVEMIR FLEXTOUCH) 100 UNIT/ML Pen Inject 40 Units into the skin 2 (two) times daily. Patient taking differently: Inject 30 Units into the skin 2 (two) times daily.  09/10/14  Yes [name], MD  ipratropium-albuterol (DUONEB) 0.5-2.5 (3) MG/3ML SOLN Take 3 mLs by nebulization every 6 (six) hours as needed (shortness of breath or wheezing). 04/20/15  Yes Evlyn Kanner Love, PA-C  Lancets MISC  05/07/11  Yes Historical Provider, MD  levothyroxine (SYNTHROID, LEVOTHROID) 50 MCG tablet Take 1 tablet (50 mcg total) by mouth daily before breakfast. 12/08/14  Yes [name], MD  LYRICA 150 MG capsule TAKE ONE CAPSULE BY MOUTH EVERY DAY 04/28/15  Yes [name], MD  magnesium oxide (MAG-OX) 400 (241.3 MG) MG tablet TAKE 1 TABLET BY MOUTH DAILY   Yes [name], MD  meclizine (ANTIVERT) 25 MG tablet Take 25 mg by mouth 3 (three) times daily as needed for dizziness.   Yes Historical Provider, MD  metFORMIN (GLUCOPHAGE) 1000 MG tablet TAKE 1 TABLET BY MOUTH TWICE A DAY WITH MEAL 03/21/15  Yes Historical Provider, MD  NOVOLOG FLEXPEN 100 UNIT/ML FlexPen INJECT 9 TO 14 UNITS UNDER THE SKIN 2 TIMES DAILY WITH A MEAL 9 UNITS BEFORE BREAKFAST AND 14 UNITS AFTER SUPPER 01/25/15  Yes [name], MD  ondansetron (ZOFRAN ODT) 4 MG disintegrating tablet Take 1 tablet (4 mg total) by mouth every 8 (eight) hours as needed for nausea or vomiting. 04/09/14  Yes Roxy Horseman, PA-C  pantoprazole (PROTONIX) 40 MG tablet Take 1 tablet (40 mg total) by mouth daily. TAKE 1 TABLET BY MOUTH TWICE  DAILY 04/20/15  Yes Evlyn Kanner Love, PA-C  saccharomyces boulardii (FLORASTOR) 250 MG capsule Take 1 capsule (250 mg total) by mouth 2 (two) times daily. 04/20/15  Yes Evlyn Kanner Love, PA-C  valsartan (DIOVAN) 160 MG tablet Take 160 mg by mouth daily. 02/03/15  Yes Historical Provider, MD  B-D ULTRAFINE III SHORT PEN 31G X 8 MM MISC USE AS DIRECTED 09/04/13   [name], MD  carvedilol (COREG) 6.25 MG tablet TAKE 1 TABLET (6.25 MG TOTAL) BY MOUTH 2 (TWO) TIMES DAILY WITH A MEAL. 05/04/15   Shelva Majestic, MD  hydrocortisone (ANUSOL-HC) 25 MG suppository Place 1 suppository (25 mg total) rectally 2 (  two) times daily. Patient not taking: Reported on 04/26/2015 03/25/15   [name], MD    BP 128/73 mmHg  Pulse 61  Temp(Src) 98.1 F (36.7 C) (Oral)  Resp 15  SpO2 99% Physical Exam  Constitutional: She is oriented to person, place, and time. She appears well-developed and well-nourished. No distress.  Patient appears uncomfortable due to pain.  HENT:  Head: Normocephalic and atraumatic.  Right Ear: External ear normal.  Left Ear: External ear normal.  Nose: Nose normal.  Mouth/Throat: Uvula is midline, oropharynx is clear and moist and mucous membranes are normal.  Eyes: Conjunctivae, EOM and lids are normal. Pupils are equal, round, and reactive to light. Right eye exhibits no discharge. Left eye exhibits no discharge. No scleral icterus.  Neck: Normal range of motion. Neck supple.  Cardiovascular: Normal rate, regular rhythm, normal heart sounds, intact distal pulses and normal pulses.   Pulmonary/Chest: Effort normal and breath sounds normal. No respiratory distress. She has no wheezes. She has no rales.  Abdominal: Soft. Normal appearance and bowel sounds are normal. She exhibits no distension and no mass. There is tenderness. There is no rigidity, no rebound and no guarding.  TTP in LUQ and LLQ. No rebound, guarding, or masses.  Musculoskeletal: Normal range of motion. She  exhibits no edema or tenderness.  Neurological: She is alert and oriented to person, place, and time. She has normal strength. No sensory deficit.  Skin: Skin is warm, dry and intact. No rash noted. She is not diaphoretic. No erythema. No pallor.  Psychiatric: She has a normal mood and affect. Her speech is normal and behavior is normal.  Nursing note and vitals reviewed.   ED Course  Procedures (including critical care time)  Labs Review Labs Reviewed  BASIC METABOLIC PANEL - Abnormal; Notable for the following:    CO2 21 (*)    Glucose, Bld 201 (*)    GFR calc non Af Amer 56 (*)    All other components within normal limits  CBC - Abnormal; Notable for the following:    MCH 25.7 (*)    RDW 17.5 (*)    All other components within normal limits  URINALYSIS, ROUTINE W REFLEX MICROSCOPIC (NOT AT [facility]) - Abnormal; Notable for the following:    Protein, ur 30 (*)    Leukocytes, UA SMALL (*)    All other components within normal limits  URINE MICROSCOPIC-ADD ON - Abnormal; Notable for the following:    Squamous Epithelial / LPF 6-30 (*)    Bacteria, UA MANY (*)    All other components within normal limits  CBG MONITORING, ED - Abnormal; Notable for the following:    Glucose-Capillary 190 (*)    All other components within normal limits  URINE CULTURE  I-STAT TROPOININ, ED  CBG MONITORING, ED    Imaging Review Ct Abdomen Pelvis Wo Contrast  05/07/2015  CLINICAL DATA:  80 year old female c/o left sided abdominal pain , tenderness. States she has had no bowel movement x 3 days. EXAM: CT ABDOMEN AND PELVIS WITHOUT CONTRAST TECHNIQUE: Multidetector CT imaging of the abdomen and pelvis was performed following the standard protocol without IV contrast. COMPARISON:  03/31/2015, 08/23/2009, 04/09/2014 FINDINGS: Lower chest: Heart size is normal. Coronary artery calcification noted. No pericardial effusion. Small calcified nodule is identified in the right upper lobe consistent with  granuloma. Noncalcified nodule is identified in the posterior right lower lobe on image 6 measuring 5 mm in diameter. A 2 mm nodule  is identified at the left lung base on image 7 the series 3. No pleural effusions or consolidations are identified. Upper abdomen: No focal abnormality identified within the liver, spleen, pancreas, or adrenal glands. No renal mass or hydronephrosis identified. Gallbladder is present. Gastrointestinal tract: The stomach has a normal appearance. There is a small bowel anastomosis in the left upper pelvis. There is focal dilatation of small bowel loops just proximal to the dilated than seen on the prior exam raising the question of partial obstruction. There is significant stool throughout colonic loops. Numerous colonic diverticula. No acute diverticulitis. Appendix is surgically absent. Pelvis: Urinary bladder is normal in appearance. Uterus is surgically absent. No adnexal mass. No free pelvic fluid. Retroperitoneum: There is moderate atherosclerosis of the abdominal aorta. No retroperitoneal or mesenteric adenopathy. Abdominal wall: There is stranding within the right anterior abdominal wall. Osseous structures: Lower thoracic and lumbar spondylosis noted. No suspicious lytic or blastic lesions are identified. IMPRESSION: 1. Focal dilatation proximal to small bowel anastomosis in the left pelvic region. Findings consistent with partial small bowel obstruction at this level. 2. Coronary artery calcification. 3. 5 mm nodule at the right lung base. If the patient is at high risk for bronchogenic carcinoma, follow-up chest CT at 6-12 months is recommended. If the patient is at low risk for bronchogenic carcinoma, follow-up chest CT at 12 months is recommended. This recommendation follows the consensus statement: Guidelines for Management of Small Pulmonary Nodules Detected on CT Scans: A Statement from the Fleischner Society as published in Radiology 2005;237:395-400. 4. Abdominal aortic  atherosclerosis. 5. Colonic diverticulosis.  Moderate stool burden. Electronically Signed   By: Norva PavlovElizabeth  Brown M.D.   On: 05/07/2015 16:28     I have personally reviewed and evaluated these images and lab results as part of my medical decision-making.   EKG Interpretation   Date/Time:  Friday May 07 2015 13:17:52 EST Ventricular Rate:  70 PR Interval:  194 QRS Duration: 68 QT Interval:  402 QTC Calculation: 434 R Axis:   1 Text Interpretation:  Normal sinus rhythm Cannot rule out Anterior infarct  , age undetermined Abnormal ECG No significant change since last tracing  Confirmed by LITTLE MD, RACHEL [id number] on 05/07/2015 4:10:27 PM      MDM   Final diagnoses:  Abdominal pain  Partial small bowel obstruction (HCC)    80 year old female presents with generalized weakness, nausea, vomiting, abdominal pain, which she states started today prior to arrival. Reports constipation, and states she has not had a bowel movement in 3 days, which is unusual for her. Also notes dysuria over the past few days. Denies fever, chills, chest pain, shortness of breath. States she experienced numbness in her arms and legs earlier this morning. Denies weakness or paresthesia.  Patient given 1L NS, pain medication, antiemetic in the ED.  Patient is afebrile. Vital signs stable. Heart regular rate and rhythm. Lungs clear to auscultation bilaterally. Abdomen soft, nondistended, with tenderness to palpation in left abdomen. No rebound, guarding, or masses. Strength and sensation intact in all 4 extremities.  CBC negative for leukocytosis or anemia. BMP unremarkable. UA with small leukocytes. Urine microscopic with 6-30 WBC, many bacteria. Troponin negative. Will obtain CT abdomen pelvis and give pain medication for patient's symptoms.  CT abdomen pelvis remarkable for focal dilatation proximal to small bowel anastomosis the left pelvic region, findings consistent with partial bowel obstruction.  Patient discussed with and seen by Dr. Clarene DukeLittle. General surgery consulted. Spoke with Dr. Dwain SarnaWakefield, who advised placing  an NG and admitting to medicine given patient's chronic medical conditions. Surgery to see the patient. Hospitalist consulted. Spoke with Dr. Benjamine Mola ([facility]ad) who will admit the patient for further evaluation and management.  BP 128/73 mmHg  Pulse 61  Temp(Src) 98.1 F (36.7 C) (Oral)  Resp 15  SpO2 99%   Mady Gemma, PA-C 05/07/15 1705  Laurence Spates, MD 05/08/15 [phone]

## 2015-05-07 NOTE — Telephone Encounter (Signed)
FYI

## 2015-05-07 NOTE — Telephone Encounter (Signed)
[facility] - Client TELEPHONE ADVICE RECORD [facility] Patient Name: Casey Harrell DOB: 08/31/1934 Initial Comment Caller states, grandmother has a stroke 3 weeks ago, sh has been constipated for 3 days, today her body is feeling weak, says things are not connecting well in her brain. Abd pains severe now. Nurse Assessment Nurse: Izola PriceMyers, RN, Cala BradfordKimberly Date/Time Lamount Cohen(Eastern Time): 05/07/2015 12:01:47 PM Confirm and document reason for call. If symptomatic, describe symptoms. ---Caller states, grandmother has a stroke 3 weeks ago, sh has been constipated for 3 days, today her body is feeling weak, says things are not connecting well in her brain. Abd pains severe now. Has gotten most of use of arm/leg back until now. States whole body almost feels numb. Has the patient traveled out of the country within the last 30 days? ---Not Applicable Does the patient have any new or worsening symptoms? ---Yes Will a triage be completed? ---Yes Related visit to physician within the last 2 weeks? ---Yes Does the PT have any chronic conditions? (i.e. diabetes, asthma, etc.) ---Yes List chronic conditions. ---diabetic, htn, Is this a behavioral health or substance abuse call? ---No Guidelines Guideline Title Affirmed Question Affirmed Notes Abdominal Pain - Female [1] SEVERE pain AND [2] age > 2360 Final Disposition User Go to ED Now Izola PriceMyers, RN, Cala BradfordKimberly [facility] Disagree/Comply: Comply

## 2015-05-07 NOTE — H&P (Addendum)
[facility] History and Physical  Casey Harrell YQM:[id number] DOB: 1935/01/18 DOA: 05/07/2015  Referring physician: er PCP: [name], MD   Chief Complaint: nausea/abd pain  HPI: Casey Harrell is a 80 y.o. female with PMHx of CVA, DM, HTN. She was recently in hospital with CVA and went to CIR.  She has done well at home.  She yesterday developed some dysuria and frequency.  She this AM had nausea, vomiting x 1 and abdominal pain.  Last BM was 3 days ago.   No fever, no chills  Several abdominal surgeries in the past, also lyses of adhesions  In the ER, a CT Scan was done that showed Focal dilatation proximal to small bowel anastomosis in the left pelvic region.  Findings consistent with partial small bowel obstruction at this level.  Review of Systems:  All systems reviewed, negative unless stated above  Past Medical History  Diagnosis Date  . DIABETES-TYPE 2 07/15/2008  . DIABETIC PERIPHERAL NEUROPATHY 09/02/2009  . HYPERTENSION 07/15/2008  . ASTHMA 07/15/2008  . GERD 07/15/2008  . PVD (peripheral vascular disease) (HCC) 06/18/09    RCE  . Normal cardiac stress test     low risk Nuc 2009, Feb 2011  . COPD (chronic obstructive pulmonary disease) (HCC)   . Vertigo    Past Surgical History  Procedure Laterality Date  . Appendectomy  1971  . Abdominal hysterectomy  1971  . Tonsillectomy    . Tubal ligation    . Cesarean section    . Flexible sigmoidoscopy      diverticulitis  . Laparotomy      X 3 with adhesions lysis  . Foot surgery      right foot X 2  . Carotid endarterectomy  06/18/09    RCE  . Colectomy and colostomy     Social History:  reports that she has never smoked. She has never used smokeless tobacco. She reports that she does not drink alcohol. Her drug history is not on file.  Allergies  Allergen Reactions  . Doxycycline Hyclate Nausea And Vomiting  . Morphine Sulfate Other (See Comments)    REACTION: hallucinations  . Iohexol Hives   Code: HIVES, Desc: pt. states she breaks out in hives 06/15/08   . Moxifloxacin Other (See Comments)    REACTION: unknown    Family History  Problem Relation Age of Onset  . Diabetes Mother   . Arthritis Mother   . Heart disease Mother   . Heart disease Father   . Celiac disease Sister   . Stroke Maternal Grandfather   . Cancer Paternal Grandfather     lung  . Heart disease Sister   . Heart disease Sister   . Asthma Sister   . Heart disease Brother   . Cancer Brother 60    lung cancer    Prior to Admission medications   Medication Sig Start Date End Date Taking? Authorizing Provider  albuterol (ACCUNEB) 1.25 MG/3ML nebulizer solution Take 1 ampule by nebulization every 6 (six) hours as needed for wheezing. Reported on 04/26/2015   Yes Historical Provider, MD  aspirin 81 MG tablet Take 81 mg by mouth daily.     Yes Historical Provider, MD  atorvastatin (LIPITOR) 40 MG tablet TAKE 1 TABLET BY MOUTH EVERY DAY AT 6 PM 04/04/15  Yes Historical Provider, MD  budesonide (PULMICORT) 0.25 MG/2ML nebulizer solution Take 2 mLs (0.25 mg total) by nebulization 2 (two) times daily. 04/20/15  Yes Jacquelynn Cree, PA-C  Calcium Citrate (CITRACAL PO) Take 1 tablet by mouth 2 (two) times daily. Reported on 04/26/2015   Yes Historical Provider, MD  carvedilol (COREG) 6.25 MG tablet TAKE 1 TABLET (6.25 MG TOTAL) BY MOUTH 2 (TWO) TIMES DAILY WITH A MEAL. 04/27/15  Yes Evlyn Kanner Love, PA-C  Cholecalciferol (VITAMIN D3) 2000 UNITS TABS Take 2,000 Units by mouth daily. Reported on 04/26/2015   Yes Historical Provider, MD  clopidogrel (PLAVIX) 75 MG tablet Take 1 tablet (75 mg total) by mouth daily. 04/20/15  Yes Evlyn Kanner Love, PA-C  DULoxetine (CYMBALTA) 60 MG capsule TAKE 1 CAPSULE BY MOUTH DAILY 01/15/15  Yes [name], MD  fluticasone (VERAMYST) 27.5 MCG/SPRAY nasal spray Place 2 sprays into the nose daily. Reported on 04/26/2015   Yes Historical Provider, MD  HUMALOG KWIKPEN 100 UNIT/ML KiwkPen  INJECT 9-14 UNITS SUB-Q TWICE A DAY WITH A MEAL. 9U BEFORE BREAKFAST AND 14 UNITS AFTER SUPPER 04/21/15  Yes Historical Provider, MD  Insulin Detemir (LEVEMIR FLEXTOUCH) 100 UNIT/ML Pen Inject 40 Units into the skin 2 (two) times daily. Patient taking differently: Inject 30 Units into the skin 2 (two) times daily.  09/10/14  Yes [name], MD  ipratropium-albuterol (DUONEB) 0.5-2.5 (3) MG/3ML SOLN Take 3 mLs by nebulization every 6 (six) hours as needed (shortness of breath or wheezing). 04/20/15  Yes Evlyn Kanner Love, PA-C  Lancets MISC  05/07/11  Yes Historical Provider, MD  levothyroxine (SYNTHROID, LEVOTHROID) 50 MCG tablet Take 1 tablet (50 mcg total) by mouth daily before breakfast. 12/08/14  Yes [name], MD  LYRICA 150 MG capsule TAKE ONE CAPSULE BY MOUTH EVERY DAY 04/28/15  Yes [name], MD  magnesium oxide (MAG-OX) 400 (241.3 MG) MG tablet TAKE 1 TABLET BY MOUTH DAILY   Yes [name], MD  meclizine (ANTIVERT) 25 MG tablet Take 25 mg by mouth 3 (three) times daily as needed for dizziness.   Yes Historical Provider, MD  metFORMIN (GLUCOPHAGE) 1000 MG tablet TAKE 1 TABLET BY MOUTH TWICE A DAY WITH MEAL 03/21/15  Yes Historical Provider, MD  NOVOLOG FLEXPEN 100 UNIT/ML FlexPen INJECT 9 TO 14 UNITS UNDER THE SKIN 2 TIMES DAILY WITH A MEAL 9 UNITS BEFORE BREAKFAST AND 14 UNITS AFTER SUPPER 01/25/15  Yes [name], MD  ondansetron (ZOFRAN ODT) 4 MG disintegrating tablet Take 1 tablet (4 mg total) by mouth every 8 (eight) hours as needed for nausea or vomiting. 04/09/14  Yes Roxy Horseman, PA-C  pantoprazole (PROTONIX) 40 MG tablet Take 1 tablet (40 mg total) by mouth daily. TAKE 1 TABLET BY MOUTH TWICE DAILY 04/20/15  Yes Evlyn Kanner Love, PA-C  saccharomyces boulardii (FLORASTOR) 250 MG capsule Take 1 capsule (250 mg total) by mouth 2 (two) times daily. 04/20/15  Yes Evlyn Kanner Love, PA-C  valsartan (DIOVAN) 160 MG tablet Take 160 mg by mouth daily. 02/03/15  Yes  Historical Provider, MD  B-D ULTRAFINE III SHORT PEN 31G X 8 MM MISC USE AS DIRECTED 09/04/13   [name], MD  carvedilol (COREG) 6.25 MG tablet TAKE 1 TABLET (6.25 MG TOTAL) BY MOUTH 2 (TWO) TIMES DAILY WITH A MEAL. 05/04/15   Shelva Majestic, MD  hydrocortisone (ANUSOL-HC) 25 MG suppository Place 1 suppository (25 mg total) rectally 2 (two) times daily. Patient not taking: Reported on 04/26/2015 03/25/15   [name], MD   Physical Exam: Filed Vitals:   05/07/15 1445 05/07/15 1515 05/07/15 1530 05/07/15 1715  BP: 130/49 136/56  128/73 133/78  Pulse: 57 60 61 58  Temp:      TempSrc:      Resp: 22 14 15 17   SpO2: 99% 98% 99% 98%    Wt Readings from Last 3 Encounters:  04/26/15 65.363 kg (144 lb 1.6 oz)  04/07/15 67.5 kg (148 lb 13 oz)  03/31/15 65.4 kg (144 lb 2.9 oz)    General:  Appears calm and comfortable Eyes: PERRL, normal lids, irises & conjunctiva ENT: grossly normal hearing, lips & tongue Neck: no LAD, masses or thyromegaly Cardiovascular: RRR, no m/r/g. No LE edema. Telemetry: SR, no arrhythmias  Respiratory: CTA bilaterally, no w/r/r. Normal respiratory effort. Abdomen: mildly tender, decreased bowel sounds Skin: no rash or induration seen on limited exam Musculoskeletal: grossly normal tone BUE/BLE Psychiatric: grossly normal mood and affect, speech fluent and appropriate Neurologic: mild right arm weakness- grip          Labs on Admission:  Basic Metabolic Panel:  Recent Labs Lab 05/07/15 1309  NA 135  K 4.4  CL 102  CO2 21*  GLUCOSE 201*  BUN 20  CREATININE 0.95  CALCIUM 9.5   Liver Function Tests: No results for input(s): AST, ALT, ALKPHOS, BILITOT, PROT, ALBUMIN in the last 168 hours. No results for input(s): LIPASE, AMYLASE in the last 168 hours. No results for input(s): AMMONIA in the last 168 hours. CBC:  Recent Labs Lab 05/07/15 1309  WBC 9.6  HGB 12.4  HCT 39.2  MCV 81.3  PLT 188   Cardiac Enzymes: No results for  input(s): CKTOTAL, CKMB, CKMBINDEX, TROPONINI in the last 168 hours.  BNP (last 3 results) No results for input(s): BNP in the last 8760 hours.  ProBNP (last 3 results) No results for input(s): PROBNP in the last 8760 hours.  CBG:  Recent Labs Lab 05/07/15 1312  GLUCAP 190*    Radiological Exams on Admission: Ct Abdomen Pelvis Wo Contrast  05/07/2015  CLINICAL DATA:  80 year old female c/o left sided abdominal pain , tenderness. States she has had no bowel movement x 3 days. EXAM: CT ABDOMEN AND PELVIS WITHOUT CONTRAST TECHNIQUE: Multidetector CT imaging of the abdomen and pelvis was performed following the standard protocol without IV contrast. COMPARISON:  03/31/2015, 08/23/2009, 04/09/2014 FINDINGS: Lower chest: Heart size is normal. Coronary artery calcification noted. No pericardial effusion. Small calcified nodule is identified in the right upper lobe consistent with granuloma. Noncalcified nodule is identified in the posterior right lower lobe on image 6 measuring 5 mm in diameter. A 2 mm nodule is identified at the left lung base on image 7 the series 3. No pleural effusions or consolidations are identified. Upper abdomen: No focal abnormality identified within the liver, spleen, pancreas, or adrenal glands. No renal mass or hydronephrosis identified. Gallbladder is present. Gastrointestinal tract: The stomach has a normal appearance. There is a small bowel anastomosis in the left upper pelvis. There is focal dilatation of small bowel loops just proximal to the dilated than seen on the prior exam raising the question of partial obstruction. There is significant stool throughout colonic loops. Numerous colonic diverticula. No acute diverticulitis. Appendix is surgically absent. Pelvis: Urinary bladder is normal in appearance. Uterus is surgically absent. No adnexal mass. No free pelvic fluid. Retroperitoneum: There is moderate atherosclerosis of the abdominal aorta. No retroperitoneal or  mesenteric adenopathy. Abdominal wall: There is stranding within the right anterior abdominal wall. Osseous structures: Lower thoracic and lumbar spondylosis noted. No suspicious lytic or blastic lesions are identified.  IMPRESSION: 1. Focal dilatation proximal to small bowel anastomosis in the left pelvic region. Findings consistent with partial small bowel obstruction at this level. 2. Coronary artery calcification. 3. 5 mm nodule at the right lung base. If the patient is at high risk for bronchogenic carcinoma, follow-up chest CT at 6-12 months is recommended. If the patient is at low risk for bronchogenic carcinoma, follow-up chest CT at 12 months is recommended. This recommendation follows the consensus statement: Guidelines for Management of Small Pulmonary Nodules Detected on CT Scans: A Statement from the [employer] as published in Radiology 2005;237:395-400. 4. Abdominal aortic atherosclerosis. 5. Colonic diverticulosis.  Moderate stool burden. Electronically Signed   By: Norva Pavlov M.D.   On: 05/07/2015 16:28    EKG: Independently reviewed. NSR  Assessment/Plan Active Problems:   Hypothyroidism   Type 2 diabetes mellitus, uncontrolled (HCC)   Small bowel obstruction (HCC)   SBO- NG tube, surgery to see NPO IVF ? Enema with colonic moderate stool burden  UTI -symptomatic -rocephin -culture  DM -[facility] Surgery- Dr. Lindie Spruce  5 mm right lung base -outpatient follow up  PT Eval-- recent stroke OOB TID   Code Status: full DVT Prophylaxis: Family Communication: patient and family at bedside Disposition Plan:   Time spent: 65 min  Sarabeth Benton [facility] Pager [phone]

## 2015-05-07 NOTE — Telephone Encounter (Signed)
Pt currently admitted.

## 2015-05-07 NOTE — Consult Note (Addendum)
Reason for Consult:Partial SBO  Referring Physician: Dr. Loma Newton is an 80 y.o. female.  HPI: 3 day history of obstipation.  Long term history of intermittent diarrhea for which she normally take Imodium to control.  Has now left sided abdominal pain associated with nausea and some vomiting.  CT findings are very soft in myb opinion, calling a bowel obstruction.   I would be very reluctant to operate on this patient for the current CT finding or clinical findings.  Past Medical History  Diagnosis Date  . DIABETES-TYPE 2 07/15/2008  . DIABETIC PERIPHERAL NEUROPATHY 09/02/2009  . HYPERTENSION 07/15/2008  . ASTHMA 07/15/2008  . GERD 07/15/2008  . PVD (peripheral vascular disease) (HCC) 06/18/09    RCE  . Normal cardiac stress test     low risk Nuc 2009, Feb 2011  . COPD (chronic obstructive pulmonary disease) (HCC)   . Vertigo     Past Surgical History  Procedure Laterality Date  . Appendectomy  1971  . Abdominal hysterectomy  1971  . Tonsillectomy    . Tubal ligation    . Cesarean section    . Flexible sigmoidoscopy      diverticulitis  . Laparotomy      X 3 with adhesions lysis  . Foot surgery      right foot X 2  . Carotid endarterectomy  06/18/09    RCE    Family History  Problem Relation Age of Onset  . Diabetes Mother   . Arthritis Mother   . Heart disease Mother   . Heart disease Father   . Celiac disease Sister   . Stroke Maternal Grandfather   . Cancer Paternal Grandfather     lung  . Heart disease Sister   . Heart disease Sister   . Asthma Sister   . Heart disease Brother   . Cancer Brother 13    lung cancer    Social History:  reports that she has never smoked. She has never used smokeless tobacco. She reports that she does not drink alcohol. Her drug history is not on file.  Allergies:  Allergies  Allergen Reactions  . Doxycycline Hyclate Nausea And Vomiting  . Morphine Sulfate Other (See Comments)    REACTION: hallucinations  . Iohexol Hives      Code: HIVES, Desc: pt. states she breaks out in hives 06/15/08   . Moxifloxacin Other (See Comments)    REACTION: unknown    Medications: I have reviewed the patient's current medications.  Results for orders placed or performed during the hospital encounter of 05/07/15 (from the past 48 hour(s))  Basic metabolic panel     Status: Abnormal   Collection Time: 05/07/15  1:09 PM  Result Value Ref Range   Sodium 135 135 - 145 mmol/L   Potassium 4.4 3.5 - 5.1 mmol/L   Chloride 102 101 - 111 mmol/L   CO2 21 (L) 22 - 32 mmol/L   Glucose, Bld 201 (H) 65 - 99 mg/dL   BUN 20 6 - 20 mg/dL   Creatinine, Ser 1.49 0.44 - 1.00 mg/dL   Calcium 9.5 8.9 - 24.3 mg/dL   GFR calc non Af Amer 56 (L) >60 mL/min   GFR calc Af Amer >60 >60 mL/min    Comment: (NOTE) The eGFR has been calculated using the CKD EPI equation. This calculation has not been validated in all clinical situations. eGFR's persistently <60 mL/min signify possible Chronic Kidney Disease.    Anion gap 12  5 - 15  CBC     Status: Abnormal   Collection Time: 05/07/15  1:09 PM  Result Value Ref Range   WBC 9.6 4.0 - 10.5 K/uL   RBC 4.82 3.87 - 5.11 MIL/uL   Hemoglobin 12.4 12.0 - 15.0 g/dL   HCT 39.2 36.0 - 46.0 %   MCV 81.3 78.0 - 100.0 fL   MCH 25.7 (L) 26.0 - 34.0 pg   MCHC 31.6 30.0 - 36.0 g/dL   RDW 17.5 (H) 11.5 - 15.5 %   Platelets 188 150 - 400 K/uL  CBG monitoring, ED     Status: Abnormal   Collection Time: 05/07/15  1:12 PM  Result Value Ref Range   Glucose-Capillary 190 (H) 65 - 99 mg/dL  I-Stat Troponin, ED (not at [facility])     Status: None   Collection Time: 05/07/15  1:21 PM  Result Value Ref Range   Troponin i, poc 0.01 0.00 - 0.08 ng/mL   Comment 3            Comment: Due to the release kinetics of cTnI, a negative result within the first hours of the onset of symptoms does not rule out myocardial infarction with certainty. If myocardial infarction is still suspected, repeat the test at appropriate  intervals.   Urinalysis, Routine w reflex microscopic (not at [facility])     Status: Abnormal   Collection Time: 05/07/15  3:02 PM  Result Value Ref Range   Color, Urine YELLOW YELLOW   APPearance CLEAR CLEAR   Specific Gravity, Urine 1.017 1.005 - 1.030   pH 6.0 5.0 - 8.0   Glucose, UA NEGATIVE NEGATIVE mg/dL   Hgb urine dipstick NEGATIVE NEGATIVE   Bilirubin Urine NEGATIVE NEGATIVE   Ketones, ur NEGATIVE NEGATIVE mg/dL   Protein, ur 30 (A) NEGATIVE mg/dL   Nitrite NEGATIVE NEGATIVE   Leukocytes, UA SMALL (A) NEGATIVE  Urine microscopic-add on     Status: Abnormal   Collection Time: 05/07/15  3:02 PM  Result Value Ref Range   Squamous Epithelial / LPF 6-30 (A) NONE SEEN   WBC, UA 6-30 0 - 5 WBC/hpf   RBC / HPF 0-5 0 - 5 RBC/hpf   Bacteria, UA MANY (A) NONE SEEN    Ct Abdomen Pelvis Wo Contrast  05/07/2015  CLINICAL DATA:  80 year old female c/o left sided abdominal pain , tenderness. States she has had no bowel movement x 3 days. EXAM: CT ABDOMEN AND PELVIS WITHOUT CONTRAST TECHNIQUE: Multidetector CT imaging of the abdomen and pelvis was performed following the standard protocol without IV contrast. COMPARISON:  03/31/2015, 08/23/2009, 04/09/2014 FINDINGS: Lower chest: Heart size is normal. Coronary artery calcification noted. No pericardial effusion. Small calcified nodule is identified in the right upper lobe consistent with granuloma. Noncalcified nodule is identified in the posterior right lower lobe on image 6 measuring 5 mm in diameter. A 2 mm nodule is identified at the left lung base on image 7 the series 3. No pleural effusions or consolidations are identified. Upper abdomen: No focal abnormality identified within the liver, spleen, pancreas, or adrenal glands. No renal mass or hydronephrosis identified. Gallbladder is present. Gastrointestinal tract: The stomach has a normal appearance. There is a small bowel anastomosis in the left upper pelvis. There is focal dilatation of small  bowel loops just proximal to the dilated than seen on the prior exam raising the question of partial obstruction. There is significant stool throughout colonic loops. Numerous colonic diverticula. No acute diverticulitis.  Appendix is surgically absent. Pelvis: Urinary bladder is normal in appearance. Uterus is surgically absent. No adnexal mass. No free pelvic fluid. Retroperitoneum: There is moderate atherosclerosis of the abdominal aorta. No retroperitoneal or mesenteric adenopathy. Abdominal wall: There is stranding within the right anterior abdominal wall. Osseous structures: Lower thoracic and lumbar spondylosis noted. No suspicious lytic or blastic lesions are identified. IMPRESSION: 1. Focal dilatation proximal to small bowel anastomosis in the left pelvic region. Findings consistent with partial small bowel obstruction at this level. 2. Coronary artery calcification. 3. 5 mm nodule at the right lung base. If the patient is at high risk for bronchogenic carcinoma, follow-up chest CT at 6-12 months is recommended. If the patient is at low risk for bronchogenic carcinoma, follow-up chest CT at 12 months is recommended. This recommendation follows the consensus statement: Guidelines for Management of Small Pulmonary Nodules Detected on CT Scans: A Statement from the [employer] as published in Radiology 2005;237:395-400. 4. Abdominal aortic atherosclerosis. 5. Colonic diverticulosis.  Moderate stool burden. Electronically Signed   By: Nolon Nations M.D.   On: 05/07/2015 16:28    ROS Blood pressure 128/73, pulse 61, temperature 98.1 F (36.7 C), temperature source Oral, resp. rate 15, SpO2 99 %. Physical Exam  Vitals reviewed. Constitutional: She appears well-developed and well-nourished.  HENT:  Head: Normocephalic and atraumatic.  Eyes: Conjunctivae and EOM are normal. Pupils are equal, round, and reactive to light.  Neck: Normal range of motion. Neck supple.  Cardiovascular: Normal rate.    Respiratory: Effort normal and breath sounds normal.  GI: Soft. Normal appearance and bowel sounds are normal. She exhibits no distension. There is tenderness (mild tenderness) in the left lower quadrant. There is no rigidity, no rebound and no guarding.  Skin: Skin is warm and dry. There is pallor.    Assessment/Plan: Partial SBO, I am not convinced this is a major problem. Plavix and ASA need to be held in case patient requires surgery. NGT to LIS. SBO protocol to further define possible obstruction.  Laquita Harlan 05/07/2015, 5:16 PM   When I was ordering the SBO protocol I was alerted to the fact that the patient has an "allergy" to contrast.  I asked the patient about this and she states that she did  Have a reaction to oral contrast 20+ years ago, but has subsequently taken some oral contrast without problems.  Thsi confusion caused me not to institute the SBO protocol at this time.  Kathryne Eriksson. Dahlia Bailiff, MD, [place] [phone] [facility]

## 2015-05-07 NOTE — ED Notes (Signed)
Pt reports being constipated x 3 days. States that she has been eating and drinking per her norm. Now having fatigue, weakness and feeling of numbness to bilateral extremities since 0930.no deficits noted at triage.

## 2015-05-07 NOTE — Telephone Encounter (Signed)
Casey BienenstockFYI; [place] call from [facility] to say pt cancel her [facility]t with them today  But they will see her next week

## 2015-05-08 ENCOUNTER — Inpatient Hospital Stay (HOSPITAL_COMMUNITY): Payer: Medicare Other

## 2015-05-08 DIAGNOSIS — R1032 Left lower quadrant pain: Secondary | ICD-10-CM

## 2015-05-08 DIAGNOSIS — K56600 Partial intestinal obstruction, unspecified as to cause: Secondary | ICD-10-CM | POA: Insufficient documentation

## 2015-05-08 DIAGNOSIS — E1165 Type 2 diabetes mellitus with hyperglycemia: Secondary | ICD-10-CM

## 2015-05-08 DIAGNOSIS — I10 Essential (primary) hypertension: Secondary | ICD-10-CM | POA: Insufficient documentation

## 2015-05-08 DIAGNOSIS — E1169 Type 2 diabetes mellitus with other specified complication: Secondary | ICD-10-CM

## 2015-05-08 DIAGNOSIS — R109 Unspecified abdominal pain: Secondary | ICD-10-CM | POA: Insufficient documentation

## 2015-05-08 LAB — BASIC METABOLIC PANEL
Anion gap: 8 (ref 5–15)
BUN: 13 mg/dL (ref 6–20)
CO2: 26 mmol/L (ref 22–32)
Calcium: 8.9 mg/dL (ref 8.9–10.3)
Chloride: 106 mmol/L (ref 101–111)
Creatinine, Ser: 0.82 mg/dL (ref 0.44–1.00)
GFR calc Af Amer: >60 mL/min (ref >60)
GFR calc non Af Amer: >60 mL/min (ref >60)
Glucose, Bld: 158 mg/dL — ABNORMAL HIGH (ref 65–99)
Potassium: 3.9 mmol/L (ref 3.5–5.1)
Sodium: 140 mmol/L (ref 135–145)

## 2015-05-08 LAB — GLUCOSE, CAPILLARY
Glucose-Capillary: 103 mg/dL — ABNORMAL HIGH (ref 65–99)
Glucose-Capillary: 117 mg/dL — ABNORMAL HIGH (ref 65–99)
Glucose-Capillary: 122 mg/dL — ABNORMAL HIGH (ref 65–99)
Glucose-Capillary: 129 mg/dL — ABNORMAL HIGH (ref 65–99)
Glucose-Capillary: 144 mg/dL — ABNORMAL HIGH (ref 65–99)
Glucose-Capillary: 163 mg/dL — ABNORMAL HIGH (ref 65–99)
Glucose-Capillary: 164 mg/dL — ABNORMAL HIGH (ref 65–99)

## 2015-05-08 LAB — CBC
HCT: 41.5 % (ref 36.0–46.0)
Hemoglobin: 13.1 g/dL (ref 12.0–15.0)
MCH: 25.8 pg — ABNORMAL LOW (ref 26.0–34.0)
MCHC: 31.6 g/dL (ref 30.0–36.0)
MCV: 81.7 fL (ref 78.0–100.0)
Platelets: 208 K/uL (ref 150–400)
RBC: 5.08 MIL/uL (ref 3.87–5.11)
RDW: 17.6 % — ABNORMAL HIGH (ref 11.5–15.5)
WBC: 9.5 K/uL (ref 4.0–10.5)

## 2015-05-08 MED ORDER — CETYLPYRIDINIUM CHLORIDE 0.05 % MT LIQD
7.0000 mL | Freq: Two times a day (BID) | OROMUCOSAL | Status: DC
  Administered 2015-05-08 – 2015-05-12 (×10): 7 mL via OROMUCOSAL

## 2015-05-08 MED ORDER — DIPHENHYDRAMINE HCL 50 MG/ML IJ SOLN
12.5000 mg | Freq: Once | INTRAMUSCULAR | Status: AC
  Administered 2015-05-08: 12.5 mg via INTRAVENOUS
  Filled 2015-05-08: qty 1

## 2015-05-08 MED ORDER — METOPROLOL TARTRATE 1 MG/ML IV SOLN
2.5000 mg | Freq: Three times a day (TID) | INTRAVENOUS | Status: DC
  Administered 2015-05-08 – 2015-05-10 (×6): 2.5 mg via INTRAVENOUS
  Filled 2015-05-08 (×6): qty 5

## 2015-05-08 NOTE — Progress Notes (Signed)
[facility] PROGRESS NOTE  Atilano MedianKay F Glassner ZOX:[id number] DOB: [birth year] DOA: 05/07/2015 PCP: Kristian CoveyBURCHETTE,BRUCE W, MD  Assessment/Plan: 1-SBO: with suspicious for scar tissue as cause of SBO -continue conservative management -NPO -IVF's -PRN pain meds -NGT at IS -CCS on board, will follow rec's -abd x-ray in am  2-UTI: -no fever -mild dysuria according to patient -continue rocephin -follow cx  3-essential HTN:  -will use IV metoprolol while NPO  4-hypothyroidism: -will use IV synthroid  5-diabetes: on chronic insulin -will continue SSI -patient NPO currently  6- 5 mm right lung base -outpatient follow up  Code Status: Full Family Communication: husband at bedside  Disposition Plan: remains in patient, continue IS NGT, NPO status, IV medications and antibiotics. X-ray in am   Consultants:  CCS  Procedures:  See below for x-ray reports   Antibiotics:  Rocephin   HPI/Subjective: Afebrile, no nausea, no vomiting, NGT in place. Patient denies CP or SOB. No BM or passing gas  Objective: Filed Vitals:   05/08/15 0519 05/08/15 1329  BP: 186/90 174/80  Pulse: 77 69  Temp: 98.3 F (36.8 C) 98.2 F (36.8 C)  Resp: 18 17    Intake/Output Summary (Last 24 hours) at 05/08/15 1839 Last data filed at 05/08/15 1547  Gross per 24 hour  Intake   1440 ml  Output   2350 ml  Net   -910 ml   Filed Weights   05/07/15 1946  Weight: 63.458 kg (139 lb 14.4 oz)    Exam:   General:  Afebrile, denies nausea or vomiting; still with LLQ abd pain and no BM or able to passed flatus  Cardiovascular: S1 and S2, no rubs or gallops  Respiratory: CTA bilaterally, no wheezing or crackles  Abdomen: tender to palpation on LLQ, no guarding, soft, no BS appreciated  Musculoskeletal: no edema, no cyanosis   Data Reviewed: Basic Metabolic Panel:  Recent Labs Lab 05/07/15 1309 05/08/15 0534  NA 135 140  K 4.4 3.9  CL 102 106  CO2 21* 26  GLUCOSE 201* 158*  BUN  20 13  CREATININE 0.95 0.82  CALCIUM 9.5 8.9   CBC:  Recent Labs Lab 05/07/15 1309 05/08/15 0534  WBC 9.6 9.5  HGB 12.4 13.1  HCT 39.2 41.5  MCV 81.3 81.7  PLT 188 208   CBG:  Recent Labs Lab 05/08/15 0008 05/08/15 0327 05/08/15 0757 05/08/15 1150 05/08/15 1548  GLUCAP 122* 144* 163* 164* 103*    Recent Results (from the past 240 hour(s))  Urine culture     Status: None (Preliminary result)   Collection Time: 05/07/15  3:02 PM  Result Value Ref Range Status   Specimen Description URINE, RANDOM  Final   Special Requests NONE  Final   Culture >=100,000 COLONIES/mL GRAM NEGATIVE RODS  Final   Report Status PENDING  Incomplete     Studies: Ct Abdomen Pelvis Wo Contrast  05/07/2015  CLINICAL DATA:  80 year old female c/o left sided abdominal pain , tenderness. States she has had no bowel movement x 3 days. EXAM: CT ABDOMEN AND PELVIS WITHOUT CONTRAST TECHNIQUE: Multidetector CT imaging of the abdomen and pelvis was performed following the standard protocol without IV contrast. COMPARISON:  03/31/2015, 08/23/2009, 04/09/2014 FINDINGS: Lower chest: Heart size is normal. Coronary artery calcification noted. No pericardial effusion. Small calcified nodule is identified in the right upper lobe consistent with granuloma. Noncalcified nodule is identified in the posterior right lower lobe on image 6 measuring 5 mm in diameter. A 2  mm nodule is identified at the left lung base on image 7 the series 3. No pleural effusions or consolidations are identified. Upper abdomen: No focal abnormality identified within the liver, spleen, pancreas, or adrenal glands. No renal mass or hydronephrosis identified. Gallbladder is present. Gastrointestinal tract: The stomach has a normal appearance. There is a small bowel anastomosis in the left upper pelvis. There is focal dilatation of small bowel loops just proximal to the dilated than seen on the prior exam raising the question of partial obstruction.  There is significant stool throughout colonic loops. Numerous colonic diverticula. No acute diverticulitis. Appendix is surgically absent. Pelvis: Urinary bladder is normal in appearance. Uterus is surgically absent. No adnexal mass. No free pelvic fluid. Retroperitoneum: There is moderate atherosclerosis of the abdominal aorta. No retroperitoneal or mesenteric adenopathy. Abdominal wall: There is stranding within the right anterior abdominal wall. Osseous structures: Lower thoracic and lumbar spondylosis noted. No suspicious lytic or blastic lesions are identified. IMPRESSION: 1. Focal dilatation proximal to small bowel anastomosis in the left pelvic region. Findings consistent with partial small bowel obstruction at this level. 2. Coronary artery calcification. 3. 5 mm nodule at the right lung base. If the patient is at high risk for bronchogenic carcinoma, follow-up chest CT at 6-12 months is recommended. If the patient is at low risk for bronchogenic carcinoma, follow-up chest CT at 12 months is recommended. This recommendation follows the consensus statement: Guidelines for Management of Small Pulmonary Nodules Detected on CT Scans: A Statement from the Fleischner Society as published in Radiology 2005;237:395-400. 4. Abdominal aortic atherosclerosis. 5. Colonic diverticulosis.  Moderate stool burden. Electronically Signed   By: Norva Pavlov M.D.   On: 05/07/2015 16:28   Dg Abd 2 Views  05/08/2015  CLINICAL DATA:  Small bowel obstruction. EXAM: ABDOMEN - 2 VIEW COMPARISON:  CT of 1 day prior. FINDINGS: Nasogastric tube looped in the stomach. Mild cardiomegaly. Upright view demonstrates no significant air-fluid levels. No free intraperitoneal air. Supine view demonstrates no residual gaseous distention of bowel loops. Moderate stool within the descending and sigmoid colon. IMPRESSION: Resolution of small bowel dilatation. Electronically Signed   By: Jeronimo Greaves M.D.   On: 05/08/2015 15:05     Scheduled Meds: . antiseptic oral rinse  7 mL Mouth Rinse BID  . cefTRIAXone (ROCEPHIN)  IV  1 g Intravenous Q24H  . enoxaparin (LOVENOX) injection  40 mg Subcutaneous Q24H  . insulin aspart  0-15 Units Subcutaneous 6 times per day  . levothyroxine  25 mcg Intravenous Daily  . metoprolol  2.5 mg Intravenous 3 times per day  . sodium chloride  3 mL Intravenous Q12H   Continuous Infusions: . sodium chloride 75 mL/hr at 05/08/15 1008     Time spent: 35 minutes    Vassie Loll  [facility] Pager [phone]. If 7PM-7AM, please contact night-coverage at [email or web address], password [facility] 05/08/2015, 6:39 PM  LOS: 1 day

## 2015-05-08 NOTE — Progress Notes (Signed)
Subjective: Feels better.  Says her pain has resolved.  No stool or flatus.  No bloating.  No cramps. NG drainage low and nonbilious.  Good urine output. WBC 9500.  Hemoglobin 13.1.  Glucose 158.  Potassium 3.9.  Creatinine 0.82.  Objective: Vital signs in last 24 hours: Temp:  [97.8 F (36.6 C)-98.3 F (36.8 C)] 98.3 F (36.8 C) (12/31 0519) Pulse Rate:  [57-77] 77 (12/31 0519) Resp:  [14-24] 18 (12/31 0519) BP: (128-186)/(49-98) 186/90 mmHg (12/31 0519) SpO2:  [90 %-99 %] 90 % (12/31 0519) Weight:  [63.458 kg (139 lb 14.4 oz)] 63.458 kg (139 lb 14.4 oz) (12/30 1946) Last BM Date: 05/04/15  Intake/Output from previous day: 12/30 0701 - 12/31 0700 In: 720 [P.O.:120; I.V.:600] Out: 950 [Urine:950] Intake/Output this shift: Total I/O In: 0  Out: 600 [Urine:600]  General appearance: Alert.  Extremely pleasant.  Oriented.  No distress.  Husband in room. Resp: clear to auscultation bilaterally GI: Soft.  Nondistended.  Nontender.  Midline scar well healed.  No hernias.  Minimal bowel sounds.  Lab Results:   Recent Labs  05/07/15 1309 05/08/15 0534  WBC 9.6 9.5  HGB 12.4 13.1  HCT 39.2 41.5  PLT 188 208   BMET  Recent Labs  05/07/15 1309 05/08/15 0534  NA 135 140  K 4.4 3.9  CL 102 106  CO2 21* 26  GLUCOSE 201* 158*  BUN 20 13  CREATININE 0.95 0.82  CALCIUM 9.5 8.9   PT/INR No results for input(s): LABPROT, INR in the last 72 hours. ABG No results for input(s): PHART, HCO3 in the last 72 hours.  Invalid input(s): PCO2, PO2  Studies/Results: Ct Abdomen Pelvis Wo Contrast  05/07/2015  CLINICAL DATA:  80 year old female c/o left sided abdominal pain , tenderness. States she has had no bowel movement x 3 days. EXAM: CT ABDOMEN AND PELVIS WITHOUT CONTRAST TECHNIQUE: Multidetector CT imaging of the abdomen and pelvis was performed following the standard protocol without IV contrast. COMPARISON:  03/31/2015, 08/23/2009, 04/09/2014 FINDINGS: Lower chest: Heart  size is normal. Coronary artery calcification noted. No pericardial effusion. Small calcified nodule is identified in the right upper lobe consistent with granuloma. Noncalcified nodule is identified in the posterior right lower lobe on image 6 measuring 5 mm in diameter. A 2 mm nodule is identified at the left lung base on image 7 the series 3. No pleural effusions or consolidations are identified. Upper abdomen: No focal abnormality identified within the liver, spleen, pancreas, or adrenal glands. No renal mass or hydronephrosis identified. Gallbladder is present. Gastrointestinal tract: The stomach has a normal appearance. There is a small bowel anastomosis in the left upper pelvis. There is focal dilatation of small bowel loops just proximal to the dilated than seen on the prior exam raising the question of partial obstruction. There is significant stool throughout colonic loops. Numerous colonic diverticula. No acute diverticulitis. Appendix is surgically absent. Pelvis: Urinary bladder is normal in appearance. Uterus is surgically absent. No adnexal mass. No free pelvic fluid. Retroperitoneum: There is moderate atherosclerosis of the abdominal aorta. No retroperitoneal or mesenteric adenopathy. Abdominal wall: There is stranding within the right anterior abdominal wall. Osseous structures: Lower thoracic and lumbar spondylosis noted. No suspicious lytic or blastic lesions are identified. IMPRESSION: 1. Focal dilatation proximal to small bowel anastomosis in the left pelvic region. Findings consistent with partial small bowel obstruction at this level. 2. Coronary artery calcification. 3. 5 mm nodule at the right lung base. If the patient  is at high risk for bronchogenic carcinoma, follow-up chest CT at 6-12 months is recommended. If the patient is at low risk for bronchogenic carcinoma, follow-up chest CT at 12 months is recommended. This recommendation follows the consensus statement: Guidelines for  Management of Small Pulmonary Nodules Detected on CT Scans: A Statement from the Fleischner Society as published in Radiology 2005;237:395-400. 4. Abdominal aortic atherosclerosis. 5. Colonic diverticulosis.  Moderate stool burden. Electronically Signed   By: Norva Pavlov M.D.   On: 05/07/2015 16:28    Anti-infectives: Anti-infectives    Start     Dose/Rate Route Frequency Ordered Stop   05/07/15 2100  cefTRIAXone (ROCEPHIN) 1 g in dextrose 5 % 50 mL IVPB     1 g 100 mL/hr over 30 Minutes Intravenous Every 24 hours 05/07/15 1953        Assessment/Plan:  Possible small bowel obstruction.  CT shows focal dilatation proximal to small bowel anastomosis left pelvic region. Clinically there is no evidence of compromised bowel or high-grade obstruction  Check belly films today Continue NG drainage Hopefully this will resolve nonoperatively.  History two-stage resection for diverticulitis History laparotomy for lysis of adhesions History abdominal hysterectomy and C-section  Insulin-dependent diabetes-management per [facility] History CVA.  Aspirin and Plavix currently being held in case she needs surgery-      LOS: 1 day    Ernestene Mention 05/08/2015

## 2015-05-08 NOTE — Evaluation (Signed)
Physical Therapy Evaluation/ DIscharge Patient Details Name: Casey Harrell MRN: [id number] DOB: [birth year] Today's Date: 05/08/2015   History of Present Illness  80 yo female admitted with SBO. PMHx: L temporoparietal stroke Nov 2016,DM, HTN, COPD  Clinical Impression  Casey Harrell is moving well and at her baseline physical function currently. She has family with her 24/7, is receiving HHPT and has all necessary DME. Pt encouraged to continue HEP while admitted and to continue to walk daily with nursing staff. Pt aware and agreeable to all above and will sign off.      Follow Up Recommendations Home health PT    Equipment Recommendations  None recommended by PT    Recommendations for Other Services       Precautions / Restrictions Precautions Precautions: Fall      Mobility  Bed Mobility               General bed mobility comments: in chair on arrival  Transfers Overall transfer level: Needs assistance   Transfers: Sit to/from Stand Sit to Stand: Supervision         General transfer comment: cues for hand placement  Ambulation/Gait Ambulation/Gait assistance: Supervision Ambulation Distance (Feet): 500 Feet Assistive device: Rolling walker (2 wheeled) Gait Pattern/deviations: Step-through pattern;Decreased stride length   Gait velocity interpretation: Below normal speed for age/gender General Gait Details: cues to step into RW  Stairs            Wheelchair Mobility    Modified Rankin (Stroke Patients Only)       Balance                                             Pertinent Vitals/Pain Pain Assessment: No/denies pain    Home Living Family/patient expects to be discharged to:: Private residence Living Arrangements: Spouse/significant other Available Help at Discharge: Family;Available 24 hours/day Type of Home: House Home Access: Stairs to enter Entrance Stairs-Rails: Left Entrance Stairs-Number of Steps: 3 Home Layout: One  level Home Equipment: Cane - single point;Walker - 2 wheels;Shower seat - built in;Bedside commode;Walker - 4 wheels Additional Comments: husband works during the day and niece stays with her while he works    Prior Function Level of Independence: Secondary school teacher / Transfers Assistance Needed: supervision for gait with RW  ADL's / Homemaking Assistance Needed: assist for homemaking and supervision for bathing, dressing, attempting to begin cooking more since CVA        Hand Dominance        Extremity/Trunk Assessment   Upper Extremity Assessment: Overall WFL for tasks assessed;Generalized weakness           Lower Extremity Assessment: Overall WFL for tasks assessed;Generalized weakness      Cervical / Trunk Assessment: Kyphotic  Communication   Communication: No difficulties  Cognition Arousal/Alertness: Awake/alert Behavior During Therapy: WFL for tasks assessed/performed Overall Cognitive Status: Within Functional Limits for tasks assessed                      General Comments      Exercises        Assessment/Plan    PT Assessment All further PT needs can be met in the next venue of care  PT Diagnosis Generalized weakness   PT Problem List Decreased strength;Decreased balance  PT Treatment Interventions     PT  Goals (Current goals can be found in the Care Plan section) Acute Rehab PT Goals PT Goal Formulation: All assessment and education complete, DC therapy    Frequency     Barriers to discharge        Co-evaluation               End of Session   Activity Tolerance: Patient tolerated treatment well Patient left: in chair;with call bell/phone within reach;with family/visitor present Nurse Communication: Mobility status         Time: 9371-6967 PT Time Calculation (min) (ACUTE ONLY): 14 min   Charges:   PT Evaluation $Initial PT Evaluation Tier I: 1 Procedure     PT G CodesMelford Aase 05/08/2015, 11:05 AM Elwyn Reach, [place]

## 2015-05-09 ENCOUNTER — Inpatient Hospital Stay (HOSPITAL_COMMUNITY): Payer: Medicare Other

## 2015-05-09 LAB — BASIC METABOLIC PANEL
Anion gap: 12 (ref 5–15)
BUN: 9 mg/dL (ref 6–20)
CO2: 24 mmol/L (ref 22–32)
Calcium: 9.2 mg/dL (ref 8.9–10.3)
Chloride: 106 mmol/L (ref 101–111)
Creatinine, Ser: 0.75 mg/dL (ref 0.44–1.00)
GFR calc Af Amer: >60 mL/min (ref >60)
GFR calc non Af Amer: >60 mL/min (ref >60)
Glucose, Bld: 135 mg/dL — ABNORMAL HIGH (ref 65–99)
Potassium: 3.3 mmol/L — ABNORMAL LOW (ref 3.5–5.1)
Sodium: 142 mmol/L (ref 135–145)

## 2015-05-09 LAB — URINE CULTURE: Culture: >=100000

## 2015-05-09 LAB — CBC
HCT: 41.8 % (ref 36.0–46.0)
Hemoglobin: 13.5 g/dL (ref 12.0–15.0)
MCH: 26.5 pg (ref 26.0–34.0)
MCHC: 32.3 g/dL (ref 30.0–36.0)
MCV: 82.1 fL (ref 78.0–100.0)
Platelets: 192 10*3/uL (ref 150–400)
RBC: 5.09 MIL/uL (ref 3.87–5.11)
RDW: 17.8 % — ABNORMAL HIGH (ref 11.5–15.5)
WBC: 9.2 10*3/uL (ref 4.0–10.5)

## 2015-05-09 LAB — GLUCOSE, CAPILLARY
Glucose-Capillary: 119 mg/dL — ABNORMAL HIGH (ref 65–99)
Glucose-Capillary: 130 mg/dL — ABNORMAL HIGH (ref 65–99)
Glucose-Capillary: 138 mg/dL — ABNORMAL HIGH (ref 65–99)
Glucose-Capillary: 166 mg/dL — ABNORMAL HIGH (ref 65–99)
Glucose-Capillary: 197 mg/dL — ABNORMAL HIGH (ref 65–99)
Glucose-Capillary: 275 mg/dL — ABNORMAL HIGH (ref 65–99)

## 2015-05-09 LAB — TROPONIN I: Troponin I: <0.03 ng/mL (ref <0.031)

## 2015-05-09 MED ORDER — FENTANYL CITRATE (PF) 100 MCG/2ML IJ SOLN
12.5000 ug | INTRAMUSCULAR | Status: DC | PRN
  Administered 2015-05-09 – 2015-05-10 (×5): 12.5 ug via INTRAVENOUS
  Filled 2015-05-09 (×5): qty 2

## 2015-05-09 MED ORDER — POTASSIUM CHLORIDE CRYS ER 20 MEQ PO TBCR
40.0000 meq | EXTENDED_RELEASE_TABLET | Freq: Two times a day (BID) | ORAL | Status: DC
  Administered 2015-05-09: 40 meq via ORAL
  Filled 2015-05-09 (×2): qty 2

## 2015-05-09 MED ORDER — HYDRALAZINE HCL 20 MG/ML IJ SOLN
10.0000 mg | Freq: Once | INTRAMUSCULAR | Status: AC
  Administered 2015-05-09: 10 mg via INTRAVENOUS

## 2015-05-09 MED ORDER — HYDRALAZINE HCL 20 MG/ML IJ SOLN
INTRAMUSCULAR | Status: AC
  Filled 2015-05-09: qty 1

## 2015-05-09 MED ORDER — PROMETHAZINE HCL 25 MG/ML IJ SOLN
12.5000 mg | Freq: Four times a day (QID) | INTRAMUSCULAR | Status: DC | PRN
  Administered 2015-05-09: 12.5 mg via INTRAVENOUS
  Filled 2015-05-09: qty 1

## 2015-05-09 NOTE — Progress Notes (Signed)
Patient with acute change in status about 7pm today.  She is very nauseated, has mild abdominal pain and is shivering, tachypnic and hypertensive.  BP 203/87 HR 77 O2 sat 94 on RA, RR 28 temp 97.7. Per RN 10mg  Labetalol given.   Upon my arrival patient is still shivering and RR 32.  BP has improved to 163/67.  She remains afebrile. She states that she is nauseated but unable to vomit.  Lung sounds clear heart tones, positive bowel sounds. Lenny Pastelom Callahan NP notified of patient status, orders received for NG tube and KUB.  Tom NP at bedside to assess patient.  NG tube inserted and placed to LWS. No output, but patient states she feels a little better.  Rn to call if assistance needed.

## 2015-05-09 NOTE — Progress Notes (Addendum)
Subjective: Feeling better.  Denies pain.  Passing some flatus and may have had a  Stool.  Good urine output Potassium 3.3 Abdominal x-rays look much better.  Basically resolution of bowel obstruction  Objective: Vital signs in last 24 hours: Temp:  [97.5 F (36.4 C)-98.2 F (36.8 C)] 97.5 F (36.4 C) (12/31 2058) Pulse Rate:  [69-79] 79 (12/31 2058) Resp:  [17-18] 18 (12/31 [id number]) BP: (174-176)/(79-80) 176/79 mmHg (12/31 2058) SpO2:  [92 %-96 %] 92 % (12/31 2058) Last BM Date: 05/08/15  Intake/Output from previous day: 12/31 0701 - 01/01 0700 In: 1320 [P.O.:120; I.V.:1200] Out: 2600 [Urine:1750; Emesis/NG output:850] Intake/Output this shift:    General appearance: Elderly but pleasant.  Ambulating in hall with husband.  Doesn't appear to be in any distress. Resp: clear to auscultation bilaterally GI: Soft.  Nondistended.  Midline scar well healed.  No hernias.  Doesn't seem distended all.  Lab Results:   Recent Labs  05/08/15 0534 05/09/15 0406  WBC 9.5 9.2  HGB 13.1 13.5  HCT 41.5 41.8  PLT 208 192   BMET  Recent Labs  05/08/15 0534 05/09/15 0406  NA 140 142  K 3.9 3.3*  CL 106 106  CO2 26 24  GLUCOSE 158* 135*  BUN 13 9  CREATININE 0.82 0.75  CALCIUM 8.9 9.2   PT/INR No results for input(s): LABPROT, INR in the last 72 hours. ABG No results for input(s): PHART, HCO3 in the last 72 hours.  Invalid input(s): PCO2, PO2  Studies/Results: Ct Abdomen Pelvis Wo Contrast  05/07/2015  CLINICAL DATA:  81 year old female c/o left sided abdominal pain , tenderness. States she has had no bowel movement x 3 days. EXAM: CT ABDOMEN AND PELVIS WITHOUT CONTRAST TECHNIQUE: Multidetector CT imaging of the abdomen and pelvis was performed following the standard protocol without IV contrast. COMPARISON:  03/31/2015, 08/23/2009, 04/09/2014 FINDINGS: Lower chest: Heart size is normal. Coronary artery calcification noted. No pericardial effusion. Small calcified nodule  is identified in the right upper lobe consistent with granuloma. Noncalcified nodule is identified in the posterior right lower lobe on image 6 measuring 5 mm in diameter. A 2 mm nodule is identified at the left lung base on image 7 the series 3. No pleural effusions or consolidations are identified. Upper abdomen: No focal abnormality identified within the liver, spleen, pancreas, or adrenal glands. No renal mass or hydronephrosis identified. Gallbladder is present. Gastrointestinal tract: The stomach has a normal appearance. There is a small bowel anastomosis in the left upper pelvis. There is focal dilatation of small bowel loops just proximal to the dilated than seen on the prior exam raising the question of partial obstruction. There is significant stool throughout colonic loops. Numerous colonic diverticula. No acute diverticulitis. Appendix is surgically absent. Pelvis: Urinary bladder is normal in appearance. Uterus is surgically absent. No adnexal mass. No free pelvic fluid. Retroperitoneum: There is moderate atherosclerosis of the abdominal aorta. No retroperitoneal or mesenteric adenopathy. Abdominal wall: There is stranding within the right anterior abdominal wall. Osseous structures: Lower thoracic and lumbar spondylosis noted. No suspicious lytic or blastic lesions are identified. IMPRESSION: 1. Focal dilatation proximal to small bowel anastomosis in the left pelvic region. Findings consistent with partial small bowel obstruction at this level. 2. Coronary artery calcification. 3. 5 mm nodule at the right lung base. If the patient is at high risk for bronchogenic carcinoma, follow-up chest CT at 6-12 months is recommended. If the patient is at low risk for bronchogenic carcinoma, follow-up  chest CT at 12 months is recommended. This recommendation follows the consensus statement: Guidelines for Management of Small Pulmonary Nodules Detected on CT Scans: A Statement from the Fleischner Society as  published in Radiology 2005;237:395-400. 4. Abdominal aortic atherosclerosis. 5. Colonic diverticulosis.  Moderate stool burden. Electronically Signed   By: Norva Pavlov M.D.   On: 05/07/2015 16:28   Dg Abd 2 Views  05/09/2015  CLINICAL DATA:  Small bowel obstruction. EXAM: ABDOMEN - 2 VIEW COMPARISON:  05/08/2015 FINDINGS: Nasogastric tube shows stable coiling in the stomach. Further diminished prominence of air in the proximal small bowel is consistent with resolving partial small bowel obstruction. Stable appearance of stool throughout the colon. No free intraperitoneal air identified on the upright film. No abnormal calcifications seen IMPRESSION: Resolving partial small bowel obstruction with further diminished prominence of air in proximal small bowel. Electronically Signed   By: Irish Lack M.D.   On: 05/09/2015 09:26   Dg Abd 2 Views  05/08/2015  CLINICAL DATA:  Small bowel obstruction. EXAM: ABDOMEN - 2 VIEW COMPARISON:  CT of 1 day prior. FINDINGS: Nasogastric tube looped in the stomach. Mild cardiomegaly. Upright view demonstrates no significant air-fluid levels. No free intraperitoneal air. Supine view demonstrates no residual gaseous distention of bowel loops. Moderate stool within the descending and sigmoid colon. IMPRESSION: Resolution of small bowel dilatation. Electronically Signed   By: Jeronimo Greaves M.D.   On: 05/08/2015 15:05    Anti-infectives: Anti-infectives    Start     Dose/Rate Route Frequency Ordered Stop   05/07/15 2100  cefTRIAXone (ROCEPHIN) 1 g in dextrose 5 % 50 mL IVPB     1 g 100 mL/hr over 30 Minutes Intravenous Every 24 hours 05/07/15 1953        Assessment/Plan:  Possible small bowel obstruction. CT shows focal dilatation proximal to small bowel anastomosis left pelvic region. Clinically there is no evidence of compromised bowel or high-grade obstruction  Clinically her obstruction is resolving Discontinue NG tube today and start clear  liquids  Hypokalemia.  We'll give K Dur twice today.  Check labs tomorrow.  History two-stage resection for diverticulitis History laparotomy for lysis of adhesions History abdominal hysterectomy and C-section  Insulin-dependent diabetes-management per [facility]  History CVA. Aspirin and Plavix currently being held in case she needs surgery--- probably can restart tomorrow    LOS: 2 days    Claud Kelp M 05/09/2015

## 2015-05-09 NOTE — Progress Notes (Signed)
[facility] PROGRESS NOTE  Casey Harrell [id number]:[id number] DOB: [birth year] DOA: 05/07/2015 PCP: [name], MD  Assessment/Plan: 1-SBO: with suspicious for scar tissue as cause of SBO -continue conservative management -advancing diet to CLD -continue PRN pain meds -NGT discontinued today (05/09/15) -CCS on board, will follow rec's -abd x-ray demonstrating improvement/resolving SBO -will follow clinical response   2-Citrobacter Koseri and Klebsiella Pneumoniae UTI: -no fever -denies dysuria today -continue rocephin -will continue IV abx's, while assessing diet tolerance   3-essential HTN:  -will use IV metoprolol while assessing diet tolerance   4-hypothyroidism: -will use IV synthroid for now; if diet tolerated will transition her meds back to PO  5-diabetes: on chronic insulin -will continue SSI -patient diet advance to CLD -will monitor CBG's and adjust hypoglycemic regimen as needed   6- 5 mm right lung base -outpatient follow up  Code Status: Full Family Communication: husband at bedside  Disposition Plan: remains in patient, will start advancing diet; CLD, follow clinical response    Consultants:  CCS  Procedures:  See below for x-ray reports   Antibiotics:  Rocephin 12/30  HPI/Subjective: Afebrile, no nausea, no vomiting, NGT has now been discontinued. No abd pain; positive flatus and BM  Objective: Filed Vitals:   05/08/15 2058 05/09/15 1346  BP: 176/79 168/69  Pulse: 79 86  Temp: 97.5 F (36.4 C) 98 F (36.7 C)  Resp: 18 18    Intake/Output Summary (Last 24 hours) at 05/09/15 1638 Last data filed at 05/09/15 0400  Gross per 24 hour  Intake    600 ml  Output   1200 ml  Net   -600 ml   Filed Weights   05/07/15 1946  Weight: 63.458 kg (139 lb 14.4 oz)    Exam:   General:  Afebrile, denies nausea or vomiting; feeling much better and reports BM and passing gas.  Cardiovascular: S1 and S2, no rubs or gallops  Respiratory:  CTA bilaterally, no wheezing or crackles  Abdomen: soft, no distension, positive BS; no guarding  Musculoskeletal: no edema, no cyanosis   Data Reviewed: Basic Metabolic Panel:  Recent Labs Lab 05/07/15 1309 05/08/15 0534 05/09/15 0406  NA 135 140 142  K 4.4 3.9 3.3*  CL 102 106 106  CO2 21* 26 24  GLUCOSE 201* 158* 135*  BUN 20 13 9   CREATININE 0.95 0.82 0.75  CALCIUM 9.5 8.9 9.2   CBC:  Recent Labs Lab 05/07/15 1309 05/08/15 0534 05/09/15 0406  WBC 9.6 9.5 9.2  HGB 12.4 13.1 13.5  HCT 39.2 41.5 41.8  MCV 81.3 81.7 82.1  PLT 188 208 192   CBG:  Recent Labs Lab 05/08/15 2326 05/09/15 0346 05/09/15 0744 05/09/15 1150 05/09/15 1556  GLUCAP 129* 130* 138* 119* 275*    Recent Results (from the past 240 hour(s))  Urine culture     Status: None   Collection Time: 05/07/15  3:02 PM  Result Value Ref Range Status   Specimen Description URINE, RANDOM  Final   Special Requests NONE  Final   Culture   Final    >=100,000 COLONIES/mL CITROBACTER KOSERI 50,000 COLONIES/mL KLEBSIELLA PNEUMONIAE    Report Status 05/09/2015 FINAL  Final   Organism ID, Bacteria CITROBACTER KOSERI  Final   Organism ID, Bacteria KLEBSIELLA PNEUMONIAE  Final      Susceptibility   Citrobacter koseri - MIC*    CEFAZOLIN <=4 SENSITIVE Sensitive     CEFTRIAXONE <=1 SENSITIVE Sensitive     CIPROFLOXACIN <=0.25 SENSITIVE Sensitive  GENTAMICIN <=1 SENSITIVE Sensitive     IMIPENEM <=0.25 SENSITIVE Sensitive     NITROFURANTOIN 32 SENSITIVE Sensitive     TRIMETH/SULFA <=20 SENSITIVE Sensitive     PIP/TAZO <=4 SENSITIVE Sensitive     * >=100,000 COLONIES/mL CITROBACTER KOSERI   Klebsiella pneumoniae - MIC*    AMPICILLIN 16 RESISTANT Resistant     CEFAZOLIN <=4 SENSITIVE Sensitive     CEFTRIAXONE <=1 SENSITIVE Sensitive     CIPROFLOXACIN <=0.25 SENSITIVE Sensitive     GENTAMICIN <=1 SENSITIVE Sensitive     IMIPENEM <=0.25 SENSITIVE Sensitive     NITROFURANTOIN 64 INTERMEDIATE  Intermediate     TRIMETH/SULFA <=20 SENSITIVE Sensitive     AMPICILLIN/SULBACTAM 4 SENSITIVE Sensitive     PIP/TAZO <=4 SENSITIVE Sensitive     * 50,000 COLONIES/mL KLEBSIELLA PNEUMONIAE     Studies: Dg Abd 2 Views  05/09/2015  CLINICAL DATA:  Small bowel obstruction. EXAM: ABDOMEN - 2 VIEW COMPARISON:  05/08/2015 FINDINGS: Nasogastric tube shows stable coiling in the stomach. Further diminished prominence of air in the proximal small bowel is consistent with resolving partial small bowel obstruction. Stable appearance of stool throughout the colon. No free intraperitoneal air identified on the upright film. No abnormal calcifications seen IMPRESSION: Resolving partial small bowel obstruction with further diminished prominence of air in proximal small bowel. Electronically Signed   By: Irish Lack M.D.   On: 05/09/2015 09:26   Dg Abd 2 Views  05/08/2015  CLINICAL DATA:  Small bowel obstruction. EXAM: ABDOMEN - 2 VIEW COMPARISON:  CT of 1 day prior. FINDINGS: Nasogastric tube looped in the stomach. Mild cardiomegaly. Upright view demonstrates no significant air-fluid levels. No free intraperitoneal air. Supine view demonstrates no residual gaseous distention of bowel loops. Moderate stool within the descending and sigmoid colon. IMPRESSION: Resolution of small bowel dilatation. Electronically Signed   By: Jeronimo Greaves M.D.   On: 05/08/2015 15:05    Scheduled Meds: . antiseptic oral rinse  7 mL Mouth Rinse BID  . cefTRIAXone (ROCEPHIN)  IV  1 g Intravenous Q24H  . enoxaparin (LOVENOX) injection  40 mg Subcutaneous Q24H  . insulin aspart  0-15 Units Subcutaneous 6 times per day  . levothyroxine  25 mcg Intravenous Daily  . metoprolol  2.5 mg Intravenous 3 times per day  . potassium chloride  40 mEq Oral BID  . sodium chloride  3 mL Intravenous Q12H   Continuous Infusions: . sodium chloride 75 mL/hr at 05/08/15 2357     Time spent: 35 minutes    Vassie Loll  [facility] Pager [phone]. If 7PM-7AM, please contact night-coverage at [email or web address], password [facility] 05/09/2015, 4:38 PM  LOS: 2 days

## 2015-05-10 DIAGNOSIS — R109 Unspecified abdominal pain: Secondary | ICD-10-CM

## 2015-05-10 LAB — BASIC METABOLIC PANEL
Anion gap: 11 (ref 5–15)
BUN: 6 mg/dL (ref 6–20)
CO2: 24 mmol/L (ref 22–32)
Calcium: 9.6 mg/dL (ref 8.9–10.3)
Chloride: 109 mmol/L (ref 101–111)
Creatinine, Ser: 0.65 mg/dL (ref 0.44–1.00)
GFR calc Af Amer: >60 mL/min (ref >60)
GFR calc non Af Amer: >60 mL/min (ref >60)
Glucose, Bld: 171 mg/dL — ABNORMAL HIGH (ref 65–99)
Potassium: 3.7 mmol/L (ref 3.5–5.1)
Sodium: 144 mmol/L (ref 135–145)

## 2015-05-10 LAB — CBC
HCT: 41.6 % (ref 36.0–46.0)
Hemoglobin: 13.6 g/dL (ref 12.0–15.0)
MCH: 26.2 pg (ref 26.0–34.0)
MCHC: 32.7 g/dL (ref 30.0–36.0)
MCV: 80.2 fL (ref 78.0–100.0)
Platelets: 204 10*3/uL (ref 150–400)
RBC: 5.19 MIL/uL — ABNORMAL HIGH (ref 3.87–5.11)
RDW: 17.4 % — ABNORMAL HIGH (ref 11.5–15.5)
WBC: 7.7 10*3/uL (ref 4.0–10.5)

## 2015-05-10 LAB — TROPONIN I
Troponin I: <0.03 ng/mL (ref <0.031)
Troponin I: <0.03 ng/mL (ref <0.031)

## 2015-05-10 LAB — GLUCOSE, CAPILLARY
Glucose-Capillary: 133 mg/dL — ABNORMAL HIGH (ref 65–99)
Glucose-Capillary: 163 mg/dL — ABNORMAL HIGH (ref 65–99)
Glucose-Capillary: 139 mg/dL — ABNORMAL HIGH (ref 65–99)
Glucose-Capillary: 114 mg/dL — ABNORMAL HIGH (ref 65–99)
Glucose-Capillary: 140 mg/dL — ABNORMAL HIGH (ref 65–99)

## 2015-05-10 MED ORDER — METOPROLOL TARTRATE 1 MG/ML IV SOLN
5.0000 mg | Freq: Three times a day (TID) | INTRAVENOUS | Status: DC
  Administered 2015-05-10 – 2015-05-11 (×2): 5 mg via INTRAVENOUS
  Filled 2015-05-10 (×2): qty 5

## 2015-05-10 MED ORDER — POTASSIUM CHLORIDE 2 MEQ/ML IV SOLN
INTRAVENOUS | Status: DC
  Administered 2015-05-10 – 2015-05-12 (×7): via INTRAVENOUS
  Filled 2015-05-10 (×10): qty 1000

## 2015-05-10 MED ORDER — HYDRALAZINE HCL 20 MG/ML IJ SOLN
10.0000 mg | Freq: Four times a day (QID) | INTRAMUSCULAR | Status: DC | PRN
  Administered 2015-05-10: 10 mg via INTRAVENOUS
  Filled 2015-05-10: qty 1

## 2015-05-10 MED ORDER — ACETAMINOPHEN 10 MG/ML IV SOLN
1000.0000 mg | Freq: Four times a day (QID) | INTRAVENOUS | Status: AC
Start: 2015-05-10 — End: 2015-05-11
  Administered 2015-05-10 – 2015-05-11 (×4): 1000 mg via INTRAVENOUS
  Filled 2015-05-10 (×4): qty 100

## 2015-05-10 NOTE — Progress Notes (Signed)
[facility] PROGRESS NOTE  Casey Harrell HYQ:[id number] DOB: November 19, 1934 DOA: 05/07/2015 PCP: [name], MD  Assessment/Plan: 1-SBO: with suspicious for scar tissue as cause of SBO -continue conservative management -NGT re-inserted overnight -will follow abd x-ray in am and CCS guidance about discontinuation on NGT and diet advancement  -continue PRN pain meds -CCS on board, will follow rec's -overall feeling better; denies nausea, vomiting and abd pain.  2-Citrobacter Koseri and Klebsiella Pneumoniae UTI: -no fever, WBC's WNL -denies dysuria today -continue rocephin -will continue IV abx's  3-essential HTN: rising and uncontrolled. -will use IV metoprolol, but dose increased to 5 mg TID -will also add PRN hydralazine    4-hypothyroidism: -will use IV synthroid for now; if diet tolerated will transition her meds back to PO  5-diabetes: on chronic insulin -will continue SSI -patient is NPO again with NGT in place -will monitor CBG's and adjust hypoglycemic regimen as needed   6- 5 mm right lung base -outpatient follow up  Code Status: Full Family Communication: husband at bedside  Disposition Plan: remains in patient, will follow CCS rec's regarding discontinuing NGT and advancing diet.   Consultants:  CCS  Procedures:  See below for x-ray reports   Antibiotics:  Rocephin 12/30  HPI/Subjective: Afebrile, no nausea, no vomiting; NGT inserted again overnight after episode of nausea and abd pain.  Objective: Filed Vitals:   05/10/15 0515 05/10/15 1346  BP: 187/66 187/78  Pulse: 67 60  Temp: 98.1 F (36.7 C) 98.6 F (37 C)  Resp: 22 18    Intake/Output Summary (Last 24 hours) at 05/10/15 1642 Last data filed at 05/10/15 1348  Gross per 24 hour  Intake   2025 ml  Output   1225 ml  Net    800 ml   Filed Weights   05/07/15 1946  Weight: 63.458 kg (139 lb 14.4 oz)    Exam:   General:  Afebrile, denies nausea or vomiting; feeling ok  now and denies abd pain. Patient overnight experienced episode of abd pain and nausea and ended getting NGT again. Reports approx 4 BM overnight and is passing gas today.  Cardiovascular: S1 and S2, no rubs or gallops  Respiratory: CTA bilaterally, no wheezing or crackles  Abdomen: soft, no distension, positive BS; no guarding  Musculoskeletal: no edema, no cyanosis   Data Reviewed: Basic Metabolic Panel:  Recent Labs Lab 05/07/15 1309 05/08/15 0534 05/09/15 0406 05/10/15 0134  NA 135 140 142 144  K 4.4 3.9 3.3* 3.7  CL 102 106 106 109  CO2 21* 26 24 24   GLUCOSE 201* 158* 135* 171*  BUN 20 13 9 6   CREATININE 0.95 0.82 0.75 0.65  CALCIUM 9.5 8.9 9.2 9.6   CBC:  Recent Labs Lab 05/07/15 1309 05/08/15 0534 05/09/15 0406 05/10/15 0134  WBC 9.6 9.5 9.2 7.7  HGB 12.4 13.1 13.5 13.6  HCT 39.2 41.5 41.8 41.6  MCV 81.3 81.7 82.1 80.2  PLT 188 208 192 204   CBG:  Recent Labs Lab 05/09/15 2346 05/10/15 0446 05/10/15 0812 05/10/15 1234 05/10/15 1635  GLUCAP 166* 133* 163* 139* 114*    Recent Results (from the past 240 hour(s))  Urine culture     Status: None   Collection Time: 05/07/15  3:02 PM  Result Value Ref Range Status   Specimen Description URINE, RANDOM  Final   Special Requests NONE  Final   Culture   Final    >=100,000 COLONIES/mL CITROBACTER KOSERI 50,000 COLONIES/mL KLEBSIELLA PNEUMONIAE  Report Status 05/09/2015 FINAL  Final   Organism ID, Bacteria CITROBACTER KOSERI  Final   Organism ID, Bacteria KLEBSIELLA PNEUMONIAE  Final      Susceptibility   Citrobacter koseri - MIC*    CEFAZOLIN <=4 SENSITIVE Sensitive     CEFTRIAXONE <=1 SENSITIVE Sensitive     CIPROFLOXACIN <=0.25 SENSITIVE Sensitive     GENTAMICIN <=1 SENSITIVE Sensitive     IMIPENEM <=0.25 SENSITIVE Sensitive     NITROFURANTOIN 32 SENSITIVE Sensitive     TRIMETH/SULFA <=20 SENSITIVE Sensitive     PIP/TAZO <=4 SENSITIVE Sensitive     * >=100,000 COLONIES/mL CITROBACTER KOSERI    Klebsiella pneumoniae - MIC*    AMPICILLIN 16 RESISTANT Resistant     CEFAZOLIN <=4 SENSITIVE Sensitive     CEFTRIAXONE <=1 SENSITIVE Sensitive     CIPROFLOXACIN <=0.25 SENSITIVE Sensitive     GENTAMICIN <=1 SENSITIVE Sensitive     IMIPENEM <=0.25 SENSITIVE Sensitive     NITROFURANTOIN 64 INTERMEDIATE Intermediate     TRIMETH/SULFA <=20 SENSITIVE Sensitive     AMPICILLIN/SULBACTAM 4 SENSITIVE Sensitive     PIP/TAZO <=4 SENSITIVE Sensitive     * 50,000 COLONIES/mL KLEBSIELLA PNEUMONIAE     Studies: Dg Abd 2 Views  05/09/2015  CLINICAL DATA:  Small bowel obstruction. EXAM: ABDOMEN - 2 VIEW COMPARISON:  05/08/2015 FINDINGS: Nasogastric tube shows stable coiling in the stomach. Further diminished prominence of air in the proximal small bowel is consistent with resolving partial small bowel obstruction. Stable appearance of stool throughout the colon. No free intraperitoneal air identified on the upright film. No abnormal calcifications seen IMPRESSION: Resolving partial small bowel obstruction with further diminished prominence of air in proximal small bowel. Electronically Signed   By: Irish Lack M.D.   On: 05/09/2015 09:26   Dg Abd Portable 1v  05/09/2015  CLINICAL DATA:  Small bowel obstruction. EXAM: PORTABLE ABDOMEN - 1 VIEW COMPARISON:  Radiographs earlier this day at 0729 hour FINDINGS: Enteric tube remains below the diaphragm in the stomach, tip directed cranially. No small bowel dilatation. Air and stool throughout the colon, similar in appearance to prior exam. No evidence of free intra-abdominal air. IMPRESSION: Enteric tube in place, tip in the stomach directed cranially. No small bowel dilatation. Air and stool throughout the colon, similar in appearance to prior. Electronically Signed   By: Rubye Oaks M.D.   On: 05/09/2015 23:07    Scheduled Meds: . antiseptic oral rinse  7 mL Mouth Rinse BID  . cefTRIAXone (ROCEPHIN)  IV  1 g Intravenous Q24H  . enoxaparin (LOVENOX)  injection  40 mg Subcutaneous Q24H  . insulin aspart  0-15 Units Subcutaneous 6 times per day  . levothyroxine  25 mcg Intravenous Daily  . metoprolol  5 mg Intravenous 3 times per day  . sodium chloride  3 mL Intravenous Q12H   Continuous Infusions: . sodium chloride 0.9 % 1,000 mL with potassium chloride 40 mEq infusion 100 mL/hr at 05/10/15 1235     Time spent: 35 minutes    Vassie Loll  [facility] Pager [phone]. If 7PM-7AM, please contact night-coverage at [email or web address], password [facility] 05/10/2015, 4:42 PM  LOS: 3 days

## 2015-05-10 NOTE — Progress Notes (Signed)
Subjective: She had a good day yesterday, and then yesterday evening she developed nausea and left sided abdominal pain.  Apparently an NG tube was inserted.  Her pain has resolved. The NG is draining clear yellow fluid, nonbilious She has had about 4 bowel movements and flatus Abdominal x-rays last night are unremarkable, no evidence of obstruction. Lab work at 1:30 AM shows WBC 7700, hemoglobin 13.6, potassium 3.7, creatinine 0.65. I discussed all of the lab and x-ray findings with the family and explained to them that there was no indication for surgical intervention.  She may have simply had gas cramps, I am not sure.  Objective: Vital signs in last 24 hours: Temp:  [97.7 F (36.5 C)-98.3 F (36.8 C)] 98.1 F (36.7 C) (01/02 0515) Pulse Rate:  [63-86] 67 (01/02 0515) Resp:  [18-32] 22 (01/02 0515) BP: (144-203)/(66-89) 187/66 mmHg (01/02 0515) SpO2:  [94 %-100 %] 100 % (01/02 0515) Last BM Date: 06/08/15  Intake/Output from previous day: 01/01 0701 - 01/02 0700 In: 2025 [I.V.:1875; IV Piggyback:150] Out: 1225 [Urine:1225] Intake/Output this shift:    General appearance: Alert.  Looks tired but in no physical distress or pain.  States she is pain-free. Resp: clear to auscultation bilaterally GI: NG drainage fairly clear.  Abdomen soft.  Nontender.  Nondistended.  Midline incision healed.  No abdominal or inguinal hernias.  Early benign exam  Lab Results:   Recent Labs  05/09/15 0406 05/10/15 0134  WBC 9.2 7.7  HGB 13.5 13.6  HCT 41.8 41.6  PLT 192 204   BMET  Recent Labs  05/09/15 0406 05/10/15 0134  NA 142 144  K 3.3* 3.7  CL 106 109  CO2 24 24  GLUCOSE 135* 171*  BUN 9 6  CREATININE 0.75 0.65  CALCIUM 9.2 9.6   PT/INR No results for input(s): LABPROT, INR in the last 72 hours. ABG No results for input(s): PHART, HCO3 in the last 72 hours.  Invalid input(s): PCO2, PO2  Studies/Results: Dg Abd 2 Views  05/09/2015  CLINICAL DATA:  Small bowel  obstruction. EXAM: ABDOMEN - 2 VIEW COMPARISON:  05/08/2015 FINDINGS: Nasogastric tube shows stable coiling in the stomach. Further diminished prominence of air in the proximal small bowel is consistent with resolving partial small bowel obstruction. Stable appearance of stool throughout the colon. No free intraperitoneal air identified on the upright film. No abnormal calcifications seen IMPRESSION: Resolving partial small bowel obstruction with further diminished prominence of air in proximal small bowel. Electronically Signed   By: Irish LackGlenn  Yamagata M.D.   On: 05/09/2015 09:26   Dg Abd 2 Views  05/08/2015  CLINICAL DATA:  Small bowel obstruction. EXAM: ABDOMEN - 2 VIEW COMPARISON:  CT of 1 day prior. FINDINGS: Nasogastric tube looped in the stomach. Mild cardiomegaly. Upright view demonstrates no significant air-fluid levels. No free intraperitoneal air. Supine view demonstrates no residual gaseous distention of bowel loops. Moderate stool within the descending and sigmoid colon. IMPRESSION: Resolution of small bowel dilatation. Electronically Signed   By: Jeronimo GreavesKyle  Talbot M.D.   On: 05/08/2015 15:05   Dg Abd Portable 1v  05/09/2015  CLINICAL DATA:  Small bowel obstruction. EXAM: PORTABLE ABDOMEN - 1 VIEW COMPARISON:  Radiographs earlier this day at 0729 hour FINDINGS: Enteric tube remains below the diaphragm in the stomach, tip directed cranially. No small bowel dilatation. Air and stool throughout the colon, similar in appearance to prior exam. No evidence of free intra-abdominal air. IMPRESSION: Enteric tube in place, tip in the stomach directed cranially.  No small bowel dilatation. Air and stool throughout the colon, similar in appearance to prior. Electronically Signed   By: Rubye Oaks M.D.   On: 05/09/2015 23:07    Anti-infectives: Anti-infectives    Start     Dose/Rate Route Frequency Ordered Stop   05/07/15 2100  cefTRIAXone (ROCEPHIN) 1 g in dextrose 5 % 50 mL IVPB     1 g 100 mL/hr over 30  Minutes Intravenous Every 24 hours 05/07/15 1953        Assessment/Plan:    Possible small bowel obstruction. CT shows focal dilatation proximal to small bowel anastomosis left pelvic region. Clinically and radiographically her obstruction have resolved The etiology of left-sided pain last night, which is transient, is unclear We'll continue NG tube today Repeat abdominal films and labs tomorrow  Hypokalemia.Resolved.  Continue IV supplementation.  History two-stage resection for diverticulitis History laparotomy for lysis of adhesions History abdominal hysterectomy and C-section  Insulin-dependent diabetes-management per [facility]  History CVA. Aspirin and Plavix currently being held in case she needs surgery--- hope can restart tomorrow   LOS: 3 days    Claud Kelp M 05/10/2015

## 2015-05-10 NOTE — Care Management Important Message (Signed)
Important Message  Patient Details  Name: Casey Harrell MRN: [id number] Date of Birth: 1935-04-28   Medicare Important Message Given:  Yes    Rayvon CharSTUTTS, Susette Seminara G 05/10/2015, 1:11 PMImportant Message  Patient Details  Name: Casey Harrell MRN: [id number] Date of Birth: 1935-04-28   Medicare Important Message Given:  Yes    Kadesha Virrueta G 05/10/2015, 1:11 PM

## 2015-05-10 NOTE — Care Management Note (Signed)
Case Management Note  Patient Details  Name: Atilano MedianKay F Vidaurri MRN: [id number] Date of Birth: [birth year]  Subjective/Objective:                    Action/Plan:  Patient active with [facility] prior to admission for home health RN,PT,OT . Resumption of care orders entered . Expected Discharge Date:                  Expected Discharge Plan:  Home w Home Health Services  In-House Referral:     Discharge planning Services  CM Consult  Post Acute Care Choice:    Choice offered to:  Patient  DME Arranged:    DME Agency:     HH Arranged:  RN, PT, OT HH Agency:  [facility]  Status of Service:  Completed, signed off  Medicare Important Message Given:    Date Medicare IM Given:    Medicare IM give by:    Date Additional Medicare IM Given:    Additional Medicare Important Message give by:     If discussed at Long Length of Stay Meetings, dates discussed:    Additional Comments:  Kingsley PlanWile, Erland Vivas Marie, RN 05/10/2015, 10:53 AM

## 2015-05-10 NOTE — Progress Notes (Signed)
[facility]  Patient Status: Active (receiving services up to time of hospitalization)  [facility] is providing the following services: RN, PT and OT  If patient discharges after hours, please call [phone].   Casey BourgeoisMarie Harrell 05/10/2015, 9:51 AM

## 2015-05-11 ENCOUNTER — Inpatient Hospital Stay (HOSPITAL_COMMUNITY): Payer: Medicare Other

## 2015-05-11 DIAGNOSIS — E1140 Type 2 diabetes mellitus with diabetic neuropathy, unspecified: Secondary | ICD-10-CM

## 2015-05-11 DIAGNOSIS — E119 Type 2 diabetes mellitus without complications: Secondary | ICD-10-CM

## 2015-05-11 DIAGNOSIS — IMO0002 Reserved for concepts with insufficient information to code with codable children: Secondary | ICD-10-CM | POA: Insufficient documentation

## 2015-05-11 DIAGNOSIS — E1165 Type 2 diabetes mellitus with hyperglycemia: Secondary | ICD-10-CM

## 2015-05-11 DIAGNOSIS — K5660 Unspecified intestinal obstruction: Secondary | ICD-10-CM

## 2015-05-11 DIAGNOSIS — N390 Urinary tract infection, site not specified: Secondary | ICD-10-CM

## 2015-05-11 LAB — CBC WITH DIFFERENTIAL/PLATELET
Basophils Absolute: 0 10*3/uL (ref 0.0–0.1)
Basophils Relative: 0 %
Eosinophils Absolute: 0.9 10*3/uL — ABNORMAL HIGH (ref 0.0–0.7)
Eosinophils Relative: 8 %
HCT: 42 % (ref 36.0–46.0)
Hemoglobin: 13.3 g/dL (ref 12.0–15.0)
Lymphocytes Relative: 34 %
Lymphs Abs: 3.6 10*3/uL (ref 0.7–4.0)
MCH: 25.8 pg — ABNORMAL LOW (ref 26.0–34.0)
MCHC: 31.7 g/dL (ref 30.0–36.0)
MCV: 81.4 fL (ref 78.0–100.0)
Monocytes Absolute: 0.7 10*3/uL (ref 0.1–1.0)
Monocytes Relative: 7 %
Neutro Abs: 5.3 10*3/uL (ref 1.7–7.7)
Neutrophils Relative %: 51 %
Platelets: 228 10*3/uL (ref 150–400)
RBC: 5.16 MIL/uL — ABNORMAL HIGH (ref 3.87–5.11)
RDW: 18.2 % — ABNORMAL HIGH (ref 11.5–15.5)
WBC: 10.5 10*3/uL (ref 4.0–10.5)

## 2015-05-11 LAB — GLUCOSE, CAPILLARY
Glucose-Capillary: 123 mg/dL — ABNORMAL HIGH (ref 65–99)
Glucose-Capillary: 128 mg/dL — ABNORMAL HIGH (ref 65–99)
Glucose-Capillary: 134 mg/dL — ABNORMAL HIGH (ref 65–99)
Glucose-Capillary: 164 mg/dL — ABNORMAL HIGH (ref 65–99)
Glucose-Capillary: 218 mg/dL — ABNORMAL HIGH (ref 65–99)
Glucose-Capillary: 223 mg/dL — ABNORMAL HIGH (ref 65–99)
Glucose-Capillary: 68 mg/dL (ref 65–99)

## 2015-05-11 LAB — BASIC METABOLIC PANEL
Anion gap: 10 (ref 5–15)
BUN: 7 mg/dL (ref 6–20)
CO2: 21 mmol/L — ABNORMAL LOW (ref 22–32)
Calcium: 9.3 mg/dL (ref 8.9–10.3)
Chloride: 112 mmol/L — ABNORMAL HIGH (ref 101–111)
Creatinine, Ser: 0.82 mg/dL (ref 0.44–1.00)
GFR calc Af Amer: >60 mL/min (ref >60)
GFR calc non Af Amer: >60 mL/min (ref >60)
Glucose, Bld: 145 mg/dL — ABNORMAL HIGH (ref 65–99)
Potassium: 4.2 mmol/L (ref 3.5–5.1)
Sodium: 143 mmol/L (ref 135–145)

## 2015-05-11 MED ORDER — FENTANYL CITRATE (PF) 100 MCG/2ML IJ SOLN
12.5000 ug | INTRAMUSCULAR | Status: DC | PRN
  Administered 2015-05-11 – 2015-05-12 (×2): 12.5 ug via INTRAVENOUS
  Filled 2015-05-11 (×2): qty 2

## 2015-05-11 MED ORDER — METOPROLOL TARTRATE 1 MG/ML IV SOLN
5.0000 mg | Freq: Four times a day (QID) | INTRAVENOUS | Status: DC
  Administered 2015-05-11 – 2015-05-12 (×5): 5 mg via INTRAVENOUS
  Filled 2015-05-11 (×5): qty 5

## 2015-05-11 NOTE — Progress Notes (Signed)
Patient ID: Casey Harrell, female   DOB: 07-23-1934, 81 y.o.   MRN: [id number]    Subjective: Pt feels well today.  Passing flatus and had 4 bms on Sunday.  Objective: Vital signs in last 24 hours: Temp:  [97.8 F (36.6 C)-98.6 F (37 C)] 98.4 F (36.9 C) 03-Jun-2022 0403) Pulse Rate:  [60-272] 272 06-03-2022 0403) Resp:  [18-19] 19 06/03/22 0403) BP: (155-187)/(73-82) 166/82 mmHg 06-03-22 0403) SpO2:  [95 %-98 %] 95 % 06/03/2022 0403) Last BM Date: 05/09/15  Intake/Output from previous day: 01/02 0701 - 2022-06-03 0700 In: 2135 [P.O.:60; I.V.:1725; IV Piggyback:350] Out: 2320 [Urine:1250; Emesis/NG output:1070] Intake/Output this shift:    PE: Abd: soft, NT, ND, +BS, NGT with mostly ice chip watery output Heart: regular Lungs: CTAB  Lab Results:   Recent Labs  05/10/15 0134 Jun 04, 2015 0546  WBC 7.7 10.5  HGB 13.6 13.3  HCT 41.6 42.0  PLT 204 228   BMET  Recent Labs  05/10/15 0134 04-Jun-2015 0546  NA 144 143  K 3.7 4.2  CL 109 112*  CO2 24 21*  GLUCOSE 171* 145*  BUN 6 7  CREATININE 0.65 0.82  CALCIUM 9.6 9.3   PT/INR No results for input(s): LABPROT, INR in the last 72 hours. CMP     Component Value Date/Time   NA 143 06-04-15 0546   K 4.2 06-04-15 0546   CL 112* June 04, 2015 0546   CO2 21* 06/04/2015 0546   GLUCOSE 145* 2015/06/04 0546   BUN 7 June 04, 2015 0546   CREATININE 0.82 06-04-2015 0546   CREATININE 0.84 04/19/2012 1700   CALCIUM 9.3 06/04/2015 0546   PROT 8.3* 03/31/2015 1419   ALBUMIN 4.5 03/31/2015 1419   AST 34 03/31/2015 1419   ALT 31 03/31/2015 1419   ALKPHOS 84 03/31/2015 1419   BILITOT 0.6 03/31/2015 1419   GFRNONAA >60 June 04, 2015 0546   GFRAA >60 June 04, 2015 0546   Lipase     Component Value Date/Time   LIPASE 19 03/31/2015 1419       Studies/Results: Dg Abd 2 Views  2015/06/04  CLINICAL DATA:  Left lower quadrant pain.  Recent bowel obstruction EXAM: ABDOMEN - 2 VIEW COMPARISON:  May 09, 2015 FINDINGS: Supine and upright images obtained.  Nasogastric tube tip and side port are in the distal stomach. There is fairly diffuse stool throughout colon. There is no bowel dilatation or air-fluid level suggesting obstruction. No free air. Lung bases clear. There are phleboliths in the pelvis. IMPRESSION: Nasogastric tube tip and side port in stomach. Bowel gas pattern currently unremarkable without apparent obstruction or free air. Electronically Signed   By: Bretta Bang III M.D.   On: 06/04/15 07:54   Dg Abd Portable 1v  05/09/2015  CLINICAL DATA:  Small bowel obstruction. EXAM: PORTABLE ABDOMEN - 1 VIEW COMPARISON:  Radiographs earlier this day at 0729 hour FINDINGS: Enteric tube remains below the diaphragm in the stomach, tip directed cranially. No small bowel dilatation. Air and stool throughout the colon, similar in appearance to prior exam. No evidence of free intra-abdominal air. IMPRESSION: Enteric tube in place, tip in the stomach directed cranially. No small bowel dilatation. Air and stool throughout the colon, similar in appearance to prior. Electronically Signed   By: Rubye Oaks M.D.   On: 05/09/2015 23:07    Anti-infectives: Anti-infectives    Start     Dose/Rate Route Frequency Ordered Stop   05/07/15 2100  cefTRIAXone (ROCEPHIN) 1 g in dextrose 5 % 50 mL IVPB  1 g 100 mL/hr over 30 Minutes Intravenous Every 24 hours 05/07/15 1953         Assessment/Plan  1. SBO, improving -will DC NGT today and give clear liquids -patient seems to be improving.   LOS: 4 days    Mittie Knittel E 05/11/2015, 8:28 AM Pager: [phone]

## 2015-05-11 NOTE — Progress Notes (Addendum)
[facility] PROGRESS NOTE  Casey Harrell YNW:[id number] DOB: [birth year] DOA: 05/07/2015 PCP: [name], MD  Interim summary  81 y.o. female with PMHx of CVA, DM, HTN. She was recently in hospital with CVA and went to CIR. She has done well at home. She yesterday developed some dysuria and frequency. She this AM had nausea, vomiting x 1 and abdominal pain. Slowly resolving SBO. NGT removed on 05/11/15 and diet advance to CLD. Observing tolerance before resuming/transitoning medications to PO  Assessment/Plan: 1-SBO: with suspicious for scar tissue as cause of SBO -continue conservative management -images studies and clinical assessment demonstrating resolving SBO. Will have diet advance to CLD and her NGT has been discontinued this morning (05/11/15) -continue PRN pain meds -CCS on board, will follow rec's -overall feeling much better; denies nausea, vomiting and abd pain.  2-Citrobacter Koseri and Klebsiella Pneumoniae UTI: -no fever, WBC's WNL -denies dysuria today -continue rocephin Day 4/7 -will continue IV abx's; patient is just back to CLD this morning (05/11/15) and will like to make sure she can tolerate it.  3-essential HTN:  -will continue use of IV metoprolol, 5 mg QID -will also add PRN hydralazine  -if diet tolerated will resume home regimen in am  4-hypothyroidism: -will use IV synthroid for now; if diet tolerated will transition her meds back to PO  5-diabetes type 2, uncontrolled and with neuropathy: on chronic insulin -will continue SSI -patient is back on CLD; CBG's 140-170 -will monitor CBG's and adjust hypoglycemic regimen as needed  -last A1C 8.9  6- 5 mm right lung base -outpatient follow up in 6 months  Code Status: Full Family Communication: husband at bedside  Disposition Plan: remains in patient, will follow CCS rec's regarding advancing diet and patient tolerance to PO regimen. Continue  rocephin.   Consultants:  CCS  Procedures:  See below for x-ray reports   Antibiotics:  Rocephin 12/30  HPI/Subjective: Afebrile, no nausea, no vomiting and denies abd pain. NGT has been removed this morning as per CCS rec's and CLD started. Positive flatus. No BM in the last 24 hours.  Objective: Filed Vitals:   05/10/15 2207 05/11/15 0403  BP: 172/73 166/82  Pulse: 84 272  Temp: 97.8 F (36.6 C) 98.4 F (36.9 C)  Resp: 18 19    Intake/Output Summary (Last 24 hours) at 05/11/15 1110 Last data filed at 05/11/15 0553  Gross per 24 hour  Intake   2135 ml  Output   2320 ml  Net   -185 ml   Filed Weights   05/07/15 1946  Weight: 63.458 kg (139 lb 14.4 oz)    Exam:   General:  Afebrile, denies nausea or vomiting; feeling better and w/o abd pain. Patient's NGT removed again today as per CCS rec's and started on CLD. Patient reported positive flatus but no BM in the last 24 hours.  Cardiovascular: S1 and S2, no rubs or gallops  Respiratory: CTA bilaterally, no wheezing or crackles  Abdomen: soft, no distension, positive BS; no guarding  Musculoskeletal: no edema, no cyanosis   Data Reviewed: Basic Metabolic Panel:  Recent Labs Lab 05/07/15 1309 05/08/15 0534 05/09/15 0406 05/10/15 0134 05/11/15 0546  NA 135 140 142 144 143  K 4.4 3.9 3.3* 3.7 4.2  CL 102 106 106 109 112*  CO2 21* 26 24 24  21*  GLUCOSE 201* 158* 135* 171* 145*  BUN 20 13 9 6 7   CREATININE 0.95 0.82 0.75 0.65 0.82  CALCIUM 9.5 8.9 9.2 9.6 9.3  CBC:  Recent Labs Lab 05/07/15 1309 05/08/15 0534 05/09/15 0406 05/10/15 0134 05/11/15 0546  WBC 9.6 9.5 9.2 7.7 10.5  NEUTROABS  --   --   --   --  5.3  HGB 12.4 13.1 13.5 13.6 13.3  HCT 39.2 41.5 41.8 41.6 42.0  MCV 81.3 81.7 82.1 80.2 81.4  PLT 188 208 192 204 228   CBG:  Recent Labs Lab 05/10/15 1635 05/10/15 2001 05/10/15 2341 05/11/15 0401 05/11/15 0755  GLUCAP 114* 140* 123* 134* 128*    Recent Results (from the  past 240 hour(s))  Urine culture     Status: None   Collection Time: 05/07/15  3:02 PM  Result Value Ref Range Status   Specimen Description URINE, RANDOM  Final   Special Requests NONE  Final   Culture   Final    >=100,000 COLONIES/mL CITROBACTER KOSERI 50,000 COLONIES/mL KLEBSIELLA PNEUMONIAE    Report Status 05/09/2015 FINAL  Final   Organism ID, Bacteria CITROBACTER KOSERI  Final   Organism ID, Bacteria KLEBSIELLA PNEUMONIAE  Final      Susceptibility   Citrobacter koseri - MIC*    CEFAZOLIN <=4 SENSITIVE Sensitive     CEFTRIAXONE <=1 SENSITIVE Sensitive     CIPROFLOXACIN <=0.25 SENSITIVE Sensitive     GENTAMICIN <=1 SENSITIVE Sensitive     IMIPENEM <=0.25 SENSITIVE Sensitive     NITROFURANTOIN 32 SENSITIVE Sensitive     TRIMETH/SULFA <=20 SENSITIVE Sensitive     PIP/TAZO <=4 SENSITIVE Sensitive     * >=100,000 COLONIES/mL CITROBACTER KOSERI   Klebsiella pneumoniae - MIC*    AMPICILLIN 16 RESISTANT Resistant     CEFAZOLIN <=4 SENSITIVE Sensitive     CEFTRIAXONE <=1 SENSITIVE Sensitive     CIPROFLOXACIN <=0.25 SENSITIVE Sensitive     GENTAMICIN <=1 SENSITIVE Sensitive     IMIPENEM <=0.25 SENSITIVE Sensitive     NITROFURANTOIN 64 INTERMEDIATE Intermediate     TRIMETH/SULFA <=20 SENSITIVE Sensitive     AMPICILLIN/SULBACTAM 4 SENSITIVE Sensitive     PIP/TAZO <=4 SENSITIVE Sensitive     * 50,000 COLONIES/mL KLEBSIELLA PNEUMONIAE     Studies: Dg Abd 2 Views  05/11/2015  CLINICAL DATA:  Left lower quadrant pain.  Recent bowel obstruction EXAM: ABDOMEN - 2 VIEW COMPARISON:  May 09, 2015 FINDINGS: Supine and upright images obtained. Nasogastric tube tip and side port are in the distal stomach. There is fairly diffuse stool throughout colon. There is no bowel dilatation or air-fluid level suggesting obstruction. No free air. Lung bases clear. There are phleboliths in the pelvis. IMPRESSION: Nasogastric tube tip and side port in stomach. Bowel gas pattern currently unremarkable  without apparent obstruction or free air. Electronically Signed   By: Bretta Bang III M.D.   On: 05/11/2015 07:54   Dg Abd Portable 1v  05/09/2015  CLINICAL DATA:  Small bowel obstruction. EXAM: PORTABLE ABDOMEN - 1 VIEW COMPARISON:  Radiographs earlier this day at 0729 hour FINDINGS: Enteric tube remains below the diaphragm in the stomach, tip directed cranially. No small bowel dilatation. Air and stool throughout the colon, similar in appearance to prior exam. No evidence of free intra-abdominal air. IMPRESSION: Enteric tube in place, tip in the stomach directed cranially. No small bowel dilatation. Air and stool throughout the colon, similar in appearance to prior. Electronically Signed   By: Rubye Oaks M.D.   On: 05/09/2015 23:07    Scheduled Meds: . acetaminophen  1,000 mg Intravenous 4 times per day  . antiseptic oral rinse  7 mL Mouth Rinse BID  . cefTRIAXone (ROCEPHIN)  IV  1 g Intravenous Q24H  . enoxaparin (LOVENOX) injection  40 mg Subcutaneous Q24H  . insulin aspart  0-15 Units Subcutaneous 6 times per day  . levothyroxine  25 mcg Intravenous Daily  . metoprolol  5 mg Intravenous 4 times per day  . sodium chloride  3 mL Intravenous Q12H   Continuous Infusions: . sodium chloride 0.9 % 1,000 mL with potassium chloride 40 mEq infusion 100 mL/hr at 05/11/15 1031     Time spent: 35 minutes    Vassie Loll  [facility] Pager [phone]. If 7PM-7AM, please contact night-coverage at [email or web address], password [facility] 05/11/2015, 11:10 AM  LOS: 4 days

## 2015-05-12 DIAGNOSIS — I10 Essential (primary) hypertension: Secondary | ICD-10-CM

## 2015-05-12 DIAGNOSIS — K5669 Other intestinal obstruction: Principal | ICD-10-CM

## 2015-05-12 LAB — GLUCOSE, CAPILLARY
Glucose-Capillary: 125 mg/dL — ABNORMAL HIGH (ref 65–99)
Glucose-Capillary: 126 mg/dL — ABNORMAL HIGH (ref 65–99)
Glucose-Capillary: 129 mg/dL — ABNORMAL HIGH (ref 65–99)
Glucose-Capillary: 177 mg/dL — ABNORMAL HIGH (ref 65–99)
Glucose-Capillary: 197 mg/dL — ABNORMAL HIGH (ref 65–99)
Glucose-Capillary: 209 mg/dL — ABNORMAL HIGH (ref 65–99)

## 2015-05-12 MED ORDER — CARVEDILOL 6.25 MG PO TABS
6.2500 mg | ORAL_TABLET | Freq: Two times a day (BID) | ORAL | Status: DC
  Administered 2015-05-12 – 2015-05-13 (×2): 6.25 mg via ORAL
  Filled 2015-05-12 (×2): qty 1

## 2015-05-12 MED ORDER — CEFUROXIME AXETIL 500 MG PO TABS
500.0000 mg | ORAL_TABLET | Freq: Two times a day (BID) | ORAL | Status: DC
  Administered 2015-05-12 – 2015-05-13 (×2): 500 mg via ORAL
  Filled 2015-05-12 (×5): qty 1

## 2015-05-12 MED ORDER — IRBESARTAN 150 MG PO TABS
150.0000 mg | ORAL_TABLET | Freq: Every day | ORAL | Status: DC
  Administered 2015-05-12 – 2015-05-13 (×2): 150 mg via ORAL
  Filled 2015-05-12 (×2): qty 1

## 2015-05-12 MED ORDER — LEVOTHYROXINE SODIUM 50 MCG PO TABS
50.0000 ug | ORAL_TABLET | Freq: Every day | ORAL | Status: DC
  Administered 2015-05-13: 50 ug via ORAL
  Filled 2015-05-12: qty 1

## 2015-05-12 NOTE — Care Management Note (Signed)
Case Management Note  Patient Details  Name: Casey Harrell MRN: [id number] Date of Birth: [birth year]  Subjective/Objective:                    Action/Plan:  UR updated Expected Discharge Date:                  Expected Discharge Plan:  Home w Home Health Services  In-House Referral:     Discharge planning Services  CM Consult  Post Acute Care Choice:    Choice offered to:  Patient  DME Arranged:    DME Agency:     HH Arranged:  RN, PT, OT HH Agency:  [facility]  Status of Service:  Completed, signed off  Medicare Important Message Given:  Yes Date Medicare IM Given:    Medicare IM give by:    Date Additional Medicare IM Given:    Additional Medicare Important Message give by:     If discussed at Long Length of Stay Meetings, dates discussed:    Additional Comments:  Kingsley PlanWile, Aikeem Lilley Marie, RN 05/12/2015, 11:20 AM

## 2015-05-12 NOTE — Progress Notes (Signed)
[facility] PROGRESS NOTE  Casey Harrell ZOX:[id number] DOB: [birth year] DOA: 05/07/2015 PCP: [name], MD  Interim summary  81 y.o. female with PMHx of CVA, DM, HTN. She was recently in hospital with CVA and went to CIR. She has done well at home. She yesterday developed some dysuria and frequency. She this AM had nausea, vomiting x 1 and abdominal pain. Slowly resolving SBO. NGT removed on 05/11/15 and diet advance to CLD. Observing tolerance before resuming/transitoning medications to PO  Assessment/Plan: 1-SBO: with suspicious for scar tissue as cause of SBO -continue conservative management -images studies and clinical assessment demonstrating resolving SBO. Diet advanced -continue PRN pain meds -overall feeling much better; denies nausea, vomiting and abd pain. Case was discussed with General Surgery on 05/12/2015, plan to continue advancing her diet. If she tolerates PO can likely be discharged in am.    2-Citrobacter Koseri and Klebsiella Pneumoniae UTI: -no fever, WBC's WNL -denies dysuria today -Will stop ceftriaxone and transition to Ceftin 500 mg PO BID  3-Essential HTN:  -Blood pressures elevated, will restart her antihypertensives: Coreg 6.26 mg PO BID and Diovan 160 mg PO q daily.   4-Hypothyroidism: -Will restart Levothyroxine 50 mcg PO q daily  5-Diabetes type 2, uncontrolled and with neuropathy: on chronic insulin -will continue SSI -patient is back on CLD; CBG's 140-170 -will monitor CBG's and adjust hypoglycemic regimen as needed  -last A1C 8.9  6- 5 mm right lung base -outpatient follow up in 6 months  Code Status: Full Family Communication: Spoke with her husband Disposition Plan: Anticipate discharge in AM if she tolerates PO    Consultants:  CCS  Procedures:  See below for x-ray reports   Antibiotics:  Rocephin 12/30  HPI/Subjective: Afebrile, no nausea, no vomiting and denies abd pain. She has tolerated clears thus far.    Objective: Filed Vitals:   05/12/15 0500 05/12/15 1405  BP: 164/70 171/71  Pulse:  62  Temp:  97.8 F (36.6 C)  Resp:  18    Intake/Output Summary (Last 24 hours) at 05/12/15 1538 Last data filed at 05/12/15 1423  Gross per 24 hour  Intake   2890 ml  Output   2200 ml  Net    690 ml   Filed Weights   05/07/15 1946  Weight: 63.458 kg (139 lb 14.4 oz)    Exam:   General:  Awake and alert, looks well. NG tube out.   Cardiovascular: S1 and S2, no rubs or gallops  Respiratory: CTA bilaterally, no wheezing or crackles  Abdomen: soft, no distension, positive BS; no guarding  Musculoskeletal: no edema, no cyanosis   Data Reviewed: Basic Metabolic Panel:  Recent Labs Lab 05/07/15 1309 05/08/15 0534 05/09/15 0406 05/10/15 0134 05/11/15 0546  NA 135 140 142 144 143  K 4.4 3.9 3.3* 3.7 4.2  CL 102 106 106 109 112*  CO2 21* 26 24 24  21*  GLUCOSE 201* 158* 135* 171* 145*  BUN 20 13 9 6 7   CREATININE 0.95 0.82 0.75 0.65 0.82  CALCIUM 9.5 8.9 9.2 9.6 9.3   CBC:  Recent Labs Lab 05/07/15 1309 05/08/15 0534 05/09/15 0406 05/10/15 0134 05/11/15 0546  WBC 9.6 9.5 9.2 7.7 10.5  NEUTROABS  --   --   --   --  5.3  HGB 12.4 13.1 13.5 13.6 13.3  HCT 39.2 41.5 41.8 41.6 42.0  MCV 81.3 81.7 82.1 80.2 81.4  PLT 188 208 192 204 228   CBG:  Recent Labs Lab  05/11/15 2348 05/12/15 0037 05/12/15 0406 05/12/15 0807 05/12/15 1131  GLUCAP 68 125* 129* 126* 197*    Recent Results (from the past 240 hour(s))  Urine culture     Status: None   Collection Time: 05/07/15  3:02 PM  Result Value Ref Range Status   Specimen Description URINE, RANDOM  Final   Special Requests NONE  Final   Culture   Final    >=100,000 COLONIES/mL CITROBACTER KOSERI 50,000 COLONIES/mL KLEBSIELLA PNEUMONIAE    Report Status 05/09/2015 FINAL  Final   Organism ID, Bacteria CITROBACTER KOSERI  Final   Organism ID, Bacteria KLEBSIELLA PNEUMONIAE  Final      Susceptibility   Citrobacter  koseri - MIC*    CEFAZOLIN <=4 SENSITIVE Sensitive     CEFTRIAXONE <=1 SENSITIVE Sensitive     CIPROFLOXACIN <=0.25 SENSITIVE Sensitive     GENTAMICIN <=1 SENSITIVE Sensitive     IMIPENEM <=0.25 SENSITIVE Sensitive     NITROFURANTOIN 32 SENSITIVE Sensitive     TRIMETH/SULFA <=20 SENSITIVE Sensitive     PIP/TAZO <=4 SENSITIVE Sensitive     * >=100,000 COLONIES/mL CITROBACTER KOSERI   Klebsiella pneumoniae - MIC*    AMPICILLIN 16 RESISTANT Resistant     CEFAZOLIN <=4 SENSITIVE Sensitive     CEFTRIAXONE <=1 SENSITIVE Sensitive     CIPROFLOXACIN <=0.25 SENSITIVE Sensitive     GENTAMICIN <=1 SENSITIVE Sensitive     IMIPENEM <=0.25 SENSITIVE Sensitive     NITROFURANTOIN 64 INTERMEDIATE Intermediate     TRIMETH/SULFA <=20 SENSITIVE Sensitive     AMPICILLIN/SULBACTAM 4 SENSITIVE Sensitive     PIP/TAZO <=4 SENSITIVE Sensitive     * 50,000 COLONIES/mL KLEBSIELLA PNEUMONIAE     Studies: Dg Abd 2 Views  05/11/2015  CLINICAL DATA:  Left lower quadrant pain.  Recent bowel obstruction EXAM: ABDOMEN - 2 VIEW COMPARISON:  May 09, 2015 FINDINGS: Supine and upright images obtained. Nasogastric tube tip and side port are in the distal stomach. There is fairly diffuse stool throughout colon. There is no bowel dilatation or air-fluid level suggesting obstruction. No free air. Lung bases clear. There are phleboliths in the pelvis. IMPRESSION: Nasogastric tube tip and side port in stomach. Bowel gas pattern currently unremarkable without apparent obstruction or free air. Electronically Signed   By: Bretta Bang III M.D.   On: 05/11/2015 07:54    Scheduled Meds: . antiseptic oral rinse  7 mL Mouth Rinse BID  . carvedilol  6.25 mg Oral BID WC  . cefTRIAXone (ROCEPHIN)  IV  1 g Intravenous Q24H  . enoxaparin (LOVENOX) injection  40 mg Subcutaneous Q24H  . insulin aspart  0-15 Units Subcutaneous 6 times per day  . irbesartan  150 mg Oral Daily  . levothyroxine  25 mcg Intravenous Daily  . sodium  chloride  3 mL Intravenous Q12H   Continuous Infusions: . sodium chloride 0.9 % 1,000 mL with potassium chloride 40 mEq infusion 100 mL/hr at 05/12/15 0624     Time spent: 25 minutes    Jeralyn Bennett  [facility] Pager [phone]. If 7PM-7AM, please contact night-coverage at [email or web address], password [facility] 05/12/2015, 3:38 PM  LOS: 5 days

## 2015-05-12 NOTE — Progress Notes (Signed)
Patient ID: Casey Harrell, female   DOB: 18-Sep-1934, 81 y.o.   MRN: [id number]    Subjective: Pt feels well today.  Still passing flatus.  No nausea with clear liquids.  Objective: Vital signs in last 24 hours: Temp:  [97.8 F (36.6 C)-97.9 F (36.6 C)] 97.9 F (36.6 C) (01/04 0424) Pulse Rate:  [56-73] 73 (01/04 0424) Resp:  [18-20] 18 (01/04 0424) BP: (154-189)/(70-79) 164/70 mmHg (01/04 0500) SpO2:  [94 %-97 %] 95 % (01/04 0424) Last BM Date: 05/09/15  Intake/Output from previous day: 01/03 0701 - 01/04 0700 In: 3490 [P.O.:1440; I.V.:2050] Out: 2200 [Urine:2200] Intake/Output this shift:    PE: Abd: soft, NT, ND, +BS  Lab Results:   Recent Labs  05/10/15 0134 05/11/15 0546  WBC 7.7 10.5  HGB 13.6 13.3  HCT 41.6 42.0  PLT 204 228   BMET  Recent Labs  05/10/15 0134 05/11/15 0546  NA 144 143  K 3.7 4.2  CL 109 112*  CO2 24 21*  GLUCOSE 171* 145*  BUN 6 7  CREATININE 0.65 0.82  CALCIUM 9.6 9.3   PT/INR No results for input(s): LABPROT, INR in the last 72 hours. CMP     Component Value Date/Time   NA 143 05/11/2015 0546   K 4.2 05/11/2015 0546   CL 112* 05/11/2015 0546   CO2 21* 05/11/2015 0546   GLUCOSE 145* 05/11/2015 0546   BUN 7 05/11/2015 0546   CREATININE 0.82 05/11/2015 0546   CREATININE 0.84 04/19/2012 1700   CALCIUM 9.3 05/11/2015 0546   PROT 8.3* 03/31/2015 1419   ALBUMIN 4.5 03/31/2015 1419   AST 34 03/31/2015 1419   ALT 31 03/31/2015 1419   ALKPHOS 84 03/31/2015 1419   BILITOT 0.6 03/31/2015 1419   GFRNONAA >60 05/11/2015 0546   GFRAA >60 05/11/2015 0546   Lipase     Component Value Date/Time   LIPASE 19 03/31/2015 1419       Studies/Results: Dg Abd 2 Views  05/11/2015  CLINICAL DATA:  Left lower quadrant pain.  Recent bowel obstruction EXAM: ABDOMEN - 2 VIEW COMPARISON:  May 09, 2015 FINDINGS: Supine and upright images obtained. Nasogastric tube tip and side port are in the distal stomach. There is fairly diffuse stool  throughout colon. There is no bowel dilatation or air-fluid level suggesting obstruction. No free air. Lung bases clear. There are phleboliths in the pelvis. IMPRESSION: Nasogastric tube tip and side port in stomach. Bowel gas pattern currently unremarkable without apparent obstruction or free air. Electronically Signed   By: Bretta BangWilliam  Woodruff III M.D.   On: 05/11/2015 07:54    Anti-infectives: Anti-infectives    Start     Dose/Rate Route Frequency Ordered Stop   05/07/15 2100  cefTRIAXone (ROCEPHIN) 1 g in dextrose 5 % 50 mL IVPB     1 g 100 mL/hr over 30 Minutes Intravenous Every 24 hours 05/07/15 1953         Assessment/Plan  1. Sbo, resolving -advance to full liquids today, if tolerates can have soft diet in the am.   LOS: 5 days    Everest Hacking E 05/12/2015, 8:49 AM Pager: [phone]

## 2015-05-13 DIAGNOSIS — R1084 Generalized abdominal pain: Secondary | ICD-10-CM

## 2015-05-13 LAB — GLUCOSE, CAPILLARY
Glucose-Capillary: 120 mg/dL — ABNORMAL HIGH (ref 65–99)
Glucose-Capillary: 143 mg/dL — ABNORMAL HIGH (ref 65–99)
Glucose-Capillary: 164 mg/dL — ABNORMAL HIGH (ref 65–99)
Glucose-Capillary: 182 mg/dL — ABNORMAL HIGH (ref 65–99)

## 2015-05-13 MED ORDER — BUDESONIDE 0.25 MG/2ML IN SUSP: 0.2500 mg | Freq: Two times a day (BID) | RESPIRATORY_TRACT | Status: DC

## 2015-05-13 MED ORDER — CEFUROXIME AXETIL 500 MG PO TABS: 500.0000 mg | ORAL_TABLET | Freq: Two times a day (BID) | ORAL | Status: DC

## 2015-05-13 MED ORDER — ALBUTEROL SULFATE 1.25 MG/3ML IN NEBU: 1.0000 | INHALATION_SOLUTION | RESPIRATORY_TRACT | Status: DC | PRN

## 2015-05-13 NOTE — Care Management Important Message (Signed)
Important Message  Patient Details  Name: Atilano MedianKay F Avery MRN: [id number] Date of Birth: 11/12/1934   Medicare Important Message Given:  Yes    Nimrat Woolworth P Dannisha Eckmann 05/13/2015, 2:17 PM

## 2015-05-13 NOTE — Progress Notes (Signed)
Patient ID: Atilano MedianKay F Haq, female   DOB: 07-01-1934, 81 y.o.   MRN: [id number]    Subjective: Pt feels well today.  Had some diarrhea this morning.  Tolerating full liquids  Objective: Vital signs in last 24 hours: Temp:  [97.8 F (36.6 C)-98.4 F (36.9 C)] 98.4 F (36.9 C) (01/05 0534) Pulse Rate:  [62-87] 87 (01/05 0534) Resp:  [16-18] 16 (01/05 0534) BP: (168-187)/(71-87) 168/87 mmHg (01/05 0534) SpO2:  [94 %-98 %] 94 % (01/05 0534) Last BM Date: 05/11/15  Intake/Output from previous day: 01/04 0701 - 01/05 0700 In: 480 [P.O.:480] Out: 1200 [Urine:1200] Intake/Output this shift:    PE: Abd: soft, NT, ND, +BS  Lab Results:   Recent Labs  05/11/15 0546  WBC 10.5  HGB 13.3  HCT 42.0  PLT 228   BMET  Recent Labs  05/11/15 0546  NA 143  K 4.2  CL 112*  CO2 21*  GLUCOSE 145*  BUN 7  CREATININE 0.82  CALCIUM 9.3   PT/INR No results for input(s): LABPROT, INR in the last 72 hours. CMP     Component Value Date/Time   NA 143 05/11/2015 0546   K 4.2 05/11/2015 0546   CL 112* 05/11/2015 0546   CO2 21* 05/11/2015 0546   GLUCOSE 145* 05/11/2015 0546   BUN 7 05/11/2015 0546   CREATININE 0.82 05/11/2015 0546   CREATININE 0.84 04/19/2012 1700   CALCIUM 9.3 05/11/2015 0546   PROT 8.3* 03/31/2015 1419   ALBUMIN 4.5 03/31/2015 1419   AST 34 03/31/2015 1419   ALT 31 03/31/2015 1419   ALKPHOS 84 03/31/2015 1419   BILITOT 0.6 03/31/2015 1419   GFRNONAA >60 05/11/2015 0546   GFRAA >60 05/11/2015 0546   Lipase     Component Value Date/Time   LIPASE 19 03/31/2015 1419       Studies/Results: No results found.  Anti-infectives: Anti-infectives    Start     Dose/Rate Route Frequency Ordered Stop   05/12/15 1800  cefUROXime (CEFTIN) tablet 500 mg     500 mg Oral 2 times daily with meals 05/12/15 1540     05/07/15 2100  cefTRIAXone (ROCEPHIN) 1 g in dextrose 5 % 50 mL IVPB  Status:  Discontinued     1 g 100 mL/hr over 30 Minutes Intravenous Every 24 hours  05/07/15 1953 05/12/15 1540       Assessment/Plan  1. sbo -resolving -advance to card mod diet. -if tolerates this, she is surgically stable for dc home today.   LOS: 6 days    Leronda Lewers E 05/13/2015, 8:53 AM Pager: [phone]

## 2015-05-13 NOTE — Discharge Planning (Signed)
Patient discharged home in stable condition. Verbalizes understanding of all discharge instructions, including home medications and follow up appointments. 

## 2015-05-13 NOTE — Discharge Summary (Signed)
Physician Discharge Summary  Casey Harrell ZOX:[id number] DOB: 1935-01-02 DOA: 05/07/2015  PCP: [name], MD  Admit date: 05/07/2015 Discharge date: 05/13/2015  Time spent: 35 minutes  Recommendations for Outpatient Follow-up:  1. Patient admitted for small bowel obstruction, tolerating by mouth intake by day of discharge. Please follow 2. She was discharged on Ceftin therapy for urinary tract infection 3. Please follow-up on blood pressure she was hypertensive during this hospitalization.   Discharge Diagnoses:  Active Problems:   Hypothyroidism   Type 2 diabetes mellitus, uncontrolled (HCC)   Small bowel obstruction (HCC)   Abdominal pain   Partial small bowel obstruction (HCC)   Benign essential HTN   Type 2 diabetes, uncontrolled, with neuropathy (HCC)   Discharge Condition: Stable/improved  Diet recommendation: Heart healthy  Filed Weights   05/07/15 1946  Weight: 63.458 kg (139 lb 14.4 oz)    History of present illness:  Casey Harrell is a 81 y.o. female with PMHx of CVA, DM, HTN. She was recently in hospital with CVA and went to CIR. She has done well at home. She yesterday developed some dysuria and frequency. She this AM had nausea, vomiting x 1 and abdominal pain. Last BM was 3 days ago.  No fever, no chills  Several abdominal surgeries in the past, also lyses of adhesions  In the ER, a CT Scan was done that showed Focal dilatation proximal to small bowel anastomosis in the left pelvic region. Findings consistent with partial small bowel obstruction at this level.  Hospital Course:  Casey Harrell is a pleasant 81 year old female with a past medical history of CVA, diabetes mellitus, hypertension, admitted to the medicine service on 05/07/2015 when she presented with complaints of abdominal pain associate with nausea and vomiting. Initial workup included a CT scan of abdomen and pelvis that revealed focal dilatation proximal to small bowel and a stenosis in  the left pelvic region findings suggestive of partial small bowel obstruction. General surgery was consulted. She was made nothing by mouth with NG tube placement.. Patient showed improvement over the subsequent days with resolution of small bowel obstruction. Her diet was slowly advanced. Follow-up imaging studies included abdominal x-ray on 05/11/2015 that did not show air-fluid levels or dilatation suggestive of obstruction. NG tube was discontinued. By 05/13/2015 she was tolerating by mouth intake. She was discharged on this date to her home in stable condition.  Procedures:  NG tube placement  Consultations:  General surgery  Discharge Exam: Filed Vitals:   05/12/15 2100 05/13/15 0534  BP: 187/84 168/87  Pulse: 67 87  Temp: 98.1 F (36.7 C) 98.4 F (36.9 C)  Resp: 17 16     General: Awake and alert, looks well. Anxious to go home today.  Cardiovascular: S1 and S2, no rubs or gallops  Respiratory: CTA bilaterally, no wheezing or crackles  Abdomen: soft, no distension, positive BS; no guarding  Musculoskeletal: no edema, no cyanosis  Discharge Instructions   Discharge Instructions    Call MD for:  difficulty breathing, headache or visual disturbances    Complete by:  As directed      Call MD for:  extreme fatigue    Complete by:  As directed      Call MD for:  hives    Complete by:  As directed      Call MD for:  persistant dizziness or light-headedness    Complete by:  As directed      Call MD for:  persistant nausea and  vomiting    Complete by:  As directed      Call MD for:  redness, tenderness, or signs of infection (pain, swelling, redness, odor or green/yellow discharge around incision site)    Complete by:  As directed      Call MD for:  severe uncontrolled pain    Complete by:  As directed      Call MD for:  temperature >100.4    Complete by:  As directed      Call MD for:    Complete by:  As directed      Diet - low sodium heart healthy    Complete  by:  As directed      Increase activity slowly    Complete by:  As directed           Discharge Medication List as of 05/13/2015 11:35 AM    START taking these medications   Details  cefUROXime (CEFTIN) 500 MG tablet Take 1 tablet (500 mg total) by mouth 2 (two) times daily with a meal., Starting 05/13/2015, Until Discontinued, Print      CONTINUE these medications which have CHANGED   Details  albuterol (ACCUNEB) 1.25 MG/3ML nebulizer solution Take 3 mLs (1.25 mg total) by nebulization every 4 (four) hours as needed for wheezing. Reported on 04/26/2015, Starting 05/13/2015, Until Discontinued, Print    budesonide (PULMICORT) 0.25 MG/2ML nebulizer solution Take 2 mLs (0.25 mg total) by nebulization 2 (two) times daily., Starting 05/13/2015, Until Discontinued, Print      CONTINUE these medications which have NOT CHANGED   Details  aspirin 81 MG tablet Take 81 mg by mouth daily.  , Until Discontinued, Historical Med    atorvastatin (LIPITOR) 40 MG tablet TAKE 1 TABLET BY MOUTH EVERY DAY AT 6 PM, Historical Med    Calcium Citrate (CITRACAL PO) Take 1 tablet by mouth 2 (two) times daily. Reported on 04/26/2015, Until Discontinued, Historical Med    Cholecalciferol (VITAMIN D3) 2000 UNITS TABS Take 2,000 Units by mouth daily. Reported on 04/26/2015, Until Discontinued, Historical Med    clopidogrel (PLAVIX) 75 MG tablet Take 1 tablet (75 mg total) by mouth daily., Starting 04/20/2015, Until Discontinued, Normal    DULoxetine (CYMBALTA) 60 MG capsule TAKE 1 CAPSULE BY MOUTH DAILY, Normal    fluticasone (VERAMYST) 27.5 MCG/SPRAY nasal spray Place 2 sprays into the nose daily. Reported on 04/26/2015, Until Discontinued, Historical Med    HUMALOG KWIKPEN 100 UNIT/ML KiwkPen INJECT 9-14 UNITS SUB-Q TWICE A DAY WITH A MEAL. 9U BEFORE BREAKFAST AND 14 UNITS AFTER SUPPER, Historical Med    Insulin Detemir (LEVEMIR FLEXTOUCH) 100 UNIT/ML Pen Inject 40 Units into the skin 2 (two) times daily.,  Starting 09/10/2014, Until Discontinued, Normal    ipratropium-albuterol (DUONEB) 0.5-2.5 (3) MG/3ML SOLN Take 3 mLs by nebulization every 6 (six) hours as needed (shortness of breath or wheezing)., Starting 04/20/2015, Until Discontinued, Normal    Lancets MISC Starting 05/07/2011, Until Discontinued, Historical Med    levothyroxine (SYNTHROID, LEVOTHROID) 50 MCG tablet Take 1 tablet (50 mcg total) by mouth daily before breakfast., Starting 12/08/2014, Until Discontinued, Normal    LYRICA 150 MG capsule TAKE ONE CAPSULE BY MOUTH EVERY DAY, Print    magnesium oxide (MAG-OX) 400 (241.3 MG) MG tablet TAKE 1 TABLET BY MOUTH DAILY, Normal    meclizine (ANTIVERT) 25 MG tablet Take 25 mg by mouth 3 (three) times daily as needed for dizziness., Until Discontinued, Historical Med    metFORMIN (GLUCOPHAGE) 1000 MG  tablet TAKE 1 TABLET BY MOUTH TWICE A DAY WITH MEAL, Historical Med    NOVOLOG FLEXPEN 100 UNIT/ML FlexPen INJECT 9 TO 14 UNITS UNDER THE SKIN 2 TIMES DAILY WITH A MEAL 9 UNITS BEFORE BREAKFAST AND 14 UNITS AFTER SUPPER, Normal    ondansetron (ZOFRAN ODT) 4 MG disintegrating tablet Take 1 tablet (4 mg total) by mouth every 8 (eight) hours as needed for nausea or vomiting., Starting 04/09/2014, Until Discontinued, Print    pantoprazole (PROTONIX) 40 MG tablet Take 1 tablet (40 mg total) by mouth daily. TAKE 1 TABLET BY MOUTH TWICE DAILY, Starting 04/20/2015, Until Discontinued, No Print    saccharomyces boulardii (FLORASTOR) 250 MG capsule Take 1 capsule (250 mg total) by mouth 2 (two) times daily., Starting 04/20/2015, Until Discontinued, Normal    valsartan (DIOVAN) 160 MG tablet Take 160 mg by mouth daily., Starting 02/03/2015, Until Discontinued, Historical Med    B-D ULTRAFINE III SHORT PEN 31G X 8 MM MISC USE AS DIRECTED, Normal    carvedilol (COREG) 6.25 MG tablet TAKE 1 TABLET (6.25 MG TOTAL) BY MOUTH 2 (TWO) TIMES DAILY WITH A MEAL., Normal    hydrocortisone (ANUSOL-HC) 25 MG  suppository Place 1 suppository (25 mg total) rectally 2 (two) times daily., Starting 03/25/2015, Until Discontinued, Normal       Allergies  Allergen Reactions  . Doxycycline Hyclate Nausea And Vomiting  . Morphine Sulfate Other (See Comments)    REACTION: hallucinations  . Iohexol Hives     Code: HIVES, Desc: pt. states she breaks out in hives 06/15/08   . Moxifloxacin Other (See Comments)    REACTION: unknown   Follow-up Information    Follow up with Kristian CoveyBURCHETTE,BRUCE W, MD In 1 week.   Specialty:  Family Medicine   Contact information:   [address] [place] [address] [phone]        The results of significant diagnostics from this hospitalization (including imaging, microbiology, ancillary and laboratory) are listed below for reference.    Significant Diagnostic Studies: Ct Abdomen Pelvis Wo Contrast  05/07/2015  CLINICAL DATA:  81 year old female c/o left sided abdominal pain , tenderness. States she has had no bowel movement x 3 days. EXAM: CT ABDOMEN AND PELVIS WITHOUT CONTRAST TECHNIQUE: Multidetector CT imaging of the abdomen and pelvis was performed following the standard protocol without IV contrast. COMPARISON:  03/31/2015, 08/23/2009, 04/09/2014 FINDINGS: Lower chest: Heart size is normal. Coronary artery calcification noted. No pericardial effusion. Small calcified nodule is identified in the right upper lobe consistent with granuloma. Noncalcified nodule is identified in the posterior right lower lobe on image 6 measuring 5 mm in diameter. A 2 mm nodule is identified at the left lung base on image 7 the series 3. No pleural effusions or consolidations are identified. Upper abdomen: No focal abnormality identified within the liver, spleen, pancreas, or adrenal glands. No renal mass or hydronephrosis identified. Gallbladder is present. Gastrointestinal tract: The stomach has a normal appearance. There is a small bowel anastomosis in the left upper pelvis.  There is focal dilatation of small bowel loops just proximal to the dilated than seen on the prior exam raising the question of partial obstruction. There is significant stool throughout colonic loops. Numerous colonic diverticula. No acute diverticulitis. Appendix is surgically absent. Pelvis: Urinary bladder is normal in appearance. Uterus is surgically absent. No adnexal mass. No free pelvic fluid. Retroperitoneum: There is moderate atherosclerosis of the abdominal aorta. No retroperitoneal or mesenteric adenopathy. Abdominal wall: There is  stranding within the right anterior abdominal wall. Osseous structures: Lower thoracic and lumbar spondylosis noted. No suspicious lytic or blastic lesions are identified. IMPRESSION: 1. Focal dilatation proximal to small bowel anastomosis in the left pelvic region. Findings consistent with partial small bowel obstruction at this level. 2. Coronary artery calcification. 3. 5 mm nodule at the right lung base. If the patient is at high risk for bronchogenic carcinoma, follow-up chest CT at 6-12 months is recommended. If the patient is at low risk for bronchogenic carcinoma, follow-up chest CT at 12 months is recommended. This recommendation follows the consensus statement: Guidelines for Management of Small Pulmonary Nodules Detected on CT Scans: A Statement from the Fleischner Society as published in Radiology 2005;237:395-400. 4. Abdominal aortic atherosclerosis. 5. Colonic diverticulosis.  Moderate stool burden. Electronically Signed   By: Norva Pavlov M.D.   On: 05/07/2015 16:28   Dg Chest 2 View  04/19/2015  CLINICAL DATA:  Cough and wheezing for 2 weeks. EXAM: CHEST  2 VIEW COMPARISON:  04/12/2015 FINDINGS: New linear opacity seen in the medial left lung base and right midlung, consistent with subsegmental atelectasis. No evidence of pulmonary consolidation or edema. No evidence of pneumothorax or pleural effusion. Heart size is within normal limits. IMPRESSION:  New mild bilateral subsegmental atelectasis. No evidence of pulmonary consolidation or edema. Electronically Signed   By: Myles Rosenthal M.D.   On: 04/19/2015 14:05   Dg Abd 2 Views  05/11/2015  CLINICAL DATA:  Left lower quadrant pain.  Recent bowel obstruction EXAM: ABDOMEN - 2 VIEW COMPARISON:  May 09, 2015 FINDINGS: Supine and upright images obtained. Nasogastric tube tip and side port are in the distal stomach. There is fairly diffuse stool throughout colon. There is no bowel dilatation or air-fluid level suggesting obstruction. No free air. Lung bases clear. There are phleboliths in the pelvis. IMPRESSION: Nasogastric tube tip and side port in stomach. Bowel gas pattern currently unremarkable without apparent obstruction or free air. Electronically Signed   By: Bretta Bang III M.D.   On: 05/11/2015 07:54   Dg Abd 2 Views  05/09/2015  CLINICAL DATA:  Small bowel obstruction. EXAM: ABDOMEN - 2 VIEW COMPARISON:  05/08/2015 FINDINGS: Nasogastric tube shows stable coiling in the stomach. Further diminished prominence of air in the proximal small bowel is consistent with resolving partial small bowel obstruction. Stable appearance of stool throughout the colon. No free intraperitoneal air identified on the upright film. No abnormal calcifications seen IMPRESSION: Resolving partial small bowel obstruction with further diminished prominence of air in proximal small bowel. Electronically Signed   By: Irish Lack M.D.   On: 05/09/2015 09:26   Dg Abd 2 Views  05/08/2015  CLINICAL DATA:  Small bowel obstruction. EXAM: ABDOMEN - 2 VIEW COMPARISON:  CT of 1 day prior. FINDINGS: Nasogastric tube looped in the stomach. Mild cardiomegaly. Upright view demonstrates no significant air-fluid levels. No free intraperitoneal air. Supine view demonstrates no residual gaseous distention of bowel loops. Moderate stool within the descending and sigmoid colon. IMPRESSION: Resolution of small bowel dilatation.  Electronically Signed   By: Jeronimo Greaves M.D.   On: 05/08/2015 15:05   Dg Abd Portable 1v  05/09/2015  CLINICAL DATA:  Small bowel obstruction. EXAM: PORTABLE ABDOMEN - 1 VIEW COMPARISON:  Radiographs earlier this day at 0729 hour FINDINGS: Enteric tube remains below the diaphragm in the stomach, tip directed cranially. No small bowel dilatation. Air and stool throughout the colon, similar in appearance to prior exam. No evidence of  free intra-abdominal air. IMPRESSION: Enteric tube in place, tip in the stomach directed cranially. No small bowel dilatation. Air and stool throughout the colon, similar in appearance to prior. Electronically Signed   By: Rubye Oaks M.D.   On: 05/09/2015 23:07    Microbiology: Recent Results (from the past 240 hour(s))  Urine culture     Status: None   Collection Time: 05/07/15  3:02 PM  Result Value Ref Range Status   Specimen Description URINE, RANDOM  Final   Special Requests NONE  Final   Culture   Final    >=100,000 COLONIES/mL CITROBACTER KOSERI 50,000 COLONIES/mL KLEBSIELLA PNEUMONIAE    Report Status 05/09/2015 FINAL  Final   Organism ID, Bacteria CITROBACTER KOSERI  Final   Organism ID, Bacteria KLEBSIELLA PNEUMONIAE  Final      Susceptibility   Citrobacter koseri - MIC*    CEFAZOLIN <=4 SENSITIVE Sensitive     CEFTRIAXONE <=1 SENSITIVE Sensitive     CIPROFLOXACIN <=0.25 SENSITIVE Sensitive     GENTAMICIN <=1 SENSITIVE Sensitive     IMIPENEM <=0.25 SENSITIVE Sensitive     NITROFURANTOIN 32 SENSITIVE Sensitive     TRIMETH/SULFA <=20 SENSITIVE Sensitive     PIP/TAZO <=4 SENSITIVE Sensitive     * >=100,000 COLONIES/mL CITROBACTER KOSERI   Klebsiella pneumoniae - MIC*    AMPICILLIN 16 RESISTANT Resistant     CEFAZOLIN <=4 SENSITIVE Sensitive     CEFTRIAXONE <=1 SENSITIVE Sensitive     CIPROFLOXACIN <=0.25 SENSITIVE Sensitive     GENTAMICIN <=1 SENSITIVE Sensitive     IMIPENEM <=0.25 SENSITIVE Sensitive     NITROFURANTOIN 64 INTERMEDIATE  Intermediate     TRIMETH/SULFA <=20 SENSITIVE Sensitive     AMPICILLIN/SULBACTAM 4 SENSITIVE Sensitive     PIP/TAZO <=4 SENSITIVE Sensitive     * 50,000 COLONIES/mL KLEBSIELLA PNEUMONIAE     Labs: Basic Metabolic Panel:  Recent Labs Lab 05/07/15 1309 05/08/15 0534 05/09/15 0406 05/10/15 0134 05/11/15 0546  NA 135 140 142 144 143  K 4.4 3.9 3.3* 3.7 4.2  CL 102 106 106 109 112*  CO2 21* 26 24 24  21*  GLUCOSE 201* 158* 135* 171* 145*  BUN 20 13 9 6 7   CREATININE 0.95 0.82 0.75 0.65 0.82  CALCIUM 9.5 8.9 9.2 9.6 9.3   Liver Function Tests: No results for input(s): AST, ALT, ALKPHOS, BILITOT, PROT, ALBUMIN in the last 168 hours. No results for input(s): LIPASE, AMYLASE in the last 168 hours. No results for input(s): AMMONIA in the last 168 hours. CBC:  Recent Labs Lab 05/07/15 1309 05/08/15 0534 05/09/15 0406 05/10/15 0134 05/11/15 0546  WBC 9.6 9.5 9.2 7.7 10.5  NEUTROABS  --   --   --   --  5.3  HGB 12.4 13.1 13.5 13.6 13.3  HCT 39.2 41.5 41.8 41.6 42.0  MCV 81.3 81.7 82.1 80.2 81.4  PLT 188 208 192 204 228   Cardiac Enzymes:  Recent Labs Lab 05/09/15 1954 05/10/15 0134 05/10/15 0850  TROPONINI <0.03 <0.03 <0.03   BNP: BNP (last 3 results) No results for input(s): BNP in the last 8760 hours.  ProBNP (last 3 results) No results for input(s): PROBNP in the last 8760 hours.  CBG:  Recent Labs Lab 05/12/15 1956 05/13/15 0007 05/13/15 0414 05/13/15 0753 05/13/15 1237  GLUCAP 209* 120* 143* 164* 182*       Signed:  Jeralyn Bennett MD  FACP  [facility] 05/13/2015, 4:57 PM

## 2015-05-14 ENCOUNTER — Telehealth: Payer: Self-pay | Admitting: Family Medicine

## 2015-05-14 NOTE — Telephone Encounter (Signed)
[place] from [facility] call to ask if it is ok for them to go see the pt on Monday 05/16/08. Pt did not feel like having them out today 1/6/17after being dc from hospital for bowel obstruction   [phone]

## 2015-05-14 NOTE — Telephone Encounter (Signed)
Left message to call office back

## 2015-05-14 NOTE — Telephone Encounter (Signed)
Okay 

## 2015-05-14 NOTE — Telephone Encounter (Signed)
OK 

## 2015-05-14 NOTE — Telephone Encounter (Signed)
[facility] with [facility] has been updated about approval to see patient

## 2015-05-20 ENCOUNTER — Telehealth: Payer: Self-pay | Admitting: Neurology

## 2015-05-20 ENCOUNTER — Ambulatory Visit (INDEPENDENT_AMBULATORY_CARE_PROVIDER_SITE_OTHER): Payer: Medicare Other | Admitting: Neurology

## 2015-05-20 ENCOUNTER — Encounter: Payer: Self-pay | Admitting: Neurology

## 2015-05-20 ENCOUNTER — Other Ambulatory Visit: Payer: Self-pay | Admitting: Family Medicine

## 2015-05-20 VITALS — BP 157/79 | HR 60 | Ht 61.0 in | Wt 143.4 lb

## 2015-05-20 DIAGNOSIS — E1159 Type 2 diabetes mellitus with other circulatory complications: Secondary | ICD-10-CM | POA: Diagnosis not present

## 2015-05-20 DIAGNOSIS — E785 Hyperlipidemia, unspecified: Secondary | ICD-10-CM | POA: Diagnosis not present

## 2015-05-20 DIAGNOSIS — I63512 Cerebral infarction due to unspecified occlusion or stenosis of left middle cerebral artery: Secondary | ICD-10-CM | POA: Diagnosis not present

## 2015-05-20 DIAGNOSIS — I679 Cerebrovascular disease, unspecified: Secondary | ICD-10-CM | POA: Diagnosis not present

## 2015-05-20 DIAGNOSIS — I10 Essential (primary) hypertension: Secondary | ICD-10-CM

## 2015-05-20 NOTE — Telephone Encounter (Signed)
Patient called 11am to advise she went to [facility] for 11am New Patient appointment with Dr. Roda ShuttersXu. Per Katrina, ok to go ahead and come to appointment but may have to wait. Patient understands and agrees. Patient is on her way now.

## 2015-05-20 NOTE — Progress Notes (Signed)
STROKE NEUROLOGY FOLLOW UP NOTE  NAME: Casey MedianKay F Vanhook DOB: August 13, 1934  REASON FOR VISIT: stroke follow up HISTORY FROM: pt and chart  Today we had the pleasure of seeing Casey Harrell in follow-up at our Neurology Clinic. Pt was accompanied by granddaughter.   History Summary Casey Harrell is a 81 y.o. female with history of DM, hypertensilon, asthma, PVD, COPD was admitted on 03/31/15 for agitation and increased confusion following diarrhea x 2 days at home. MRI showed small patchy left MCA infarcts with remote bilateral occipital infarcts, embolic pattern. MRA showed segmental L M1 stenosis as well as focal severe stenoses L V4 segment, proximal R SCA, and mid L P2 segment. CUS and TTE unremarkable. LDL 76 and A1C 9.0. EEG no seizure. Mental status improved overnight, considering encephalopathy due to multiple factors, such as stroke, dehydration, diarrhea and hypertensive urgency. On 04/02/15 pt was sitting after PT/OT session, developed AMS, aphasia, right UE and LE weakness. Found to have low BP at 80s to 90s. Lying her flat in bed, gave IV bolus, albumin 4 doses. Stat CT no bleeding and MRI showed patchy extension of left MCA infarcts, consistent with low BP in the setting of left M1 stenosis. Her aphasia resolved but Casey Harrell still has right hemiparesis although with some improvement. Casey Harrell was put on dual antiplatelet and lipitor, recommended 30 day monitoring. Casey Harrell was discharged to CIR after stabilization.  Interval History During the interval time, the patient has been doing significantly better. Casey Harrell stayed in CIR for 2 weeks and discharged home with improvement of right UE and LE strength. Casey Harrell has HH PT/OT/speech and her right LE strength close to baseline and still has mild weakness at right UE. Able to walk without device. No speech difficulties. BP today 157/79. Not have 30 day monitoring yet.  Casey Harrell was [facility] for abdominal pain associate with nausea and vomiting. CT scan  of abdomen and pelvis revealed partial small bowel obstruction. Casey Harrell was put on NPO and NG tube. Patient showed improvement over the subsequent days with resolution of small bowel obstruction. Her diet was slowly advanced. Follow-up imaging studies included abdominal x-ray on 05/11/2015 that did not show air-fluid levels or dilatation suggestive of obstruction. NG tube was discontinued. By 05/13/2015 Casey Harrell was tolerating by mouth intake. Casey Harrell was discharged to home in stable condition.  REVIEW OF SYSTEMS: Full 14 system review of systems performed and notable only for those listed below and in HPI above, all others are negative:  Constitutional:   Cardiovascular:  Ear/Nose/Throat:   Skin:  Eyes:   Respiratory:  cough Gastroitestinal:  incontinence Genitourinary: incontinence Hematology/Lymphatic:   Endocrine: increased thirst Musculoskeletal:   Allergy/Immunology:   Neurological:   Psychiatric:  Sleep:   The following represents the patient's updated allergies and side effects list: Allergies  Allergen Reactions  . Doxycycline Hyclate Nausea And Vomiting  . Morphine Sulfate Other (See Comments)    REACTION: hallucinations  . Iohexol Hives     Code: HIVES, Desc: pt. states Casey Harrell breaks out in hives 06/15/08   . Moxifloxacin Other (See Comments)    REACTION: unknown    The neurologically relevant items on the patient's problem list were reviewed on today's visit.  Neurologic Examination  A problem focused neurological exam (12 or more points of the single system neurologic examination, vital signs counts as 1 point, cranial nerves count for 8 points) was performed.  Blood pressure 157/79, pulse 60, height 5\' 1"  (1.549 m), weight  143 lb 6.4 oz (65.046 kg).  General - Well nourished, well developed, in no apparent distress.  Ophthalmologic - Fundi not visualized due to eye movement.  Cardiovascular - Regular rate and rhythm.  Mental Status -  Level of arousal and orientation to  time, place, and person were intact. Language including expression, naming, repetition, comprehension was assessed and found intact. Fund of Knowledge was assessed and was intact.  Cranial Nerves II - XII - II - Visual field intact OU. III, IV, VI - Extraocular movements intact. V - Facial sensation intact bilaterally. VII - Facial movement intact bilaterally. VIII - Hearing & vestibular intact bilaterally. X - Palate elevates symmetrically. XI - Chin turning & shoulder shrug intact bilaterally. XII - Tongue protrusion intact.  Motor Strength - The patient's strength was normal in all extremities except RUE 4+/5 with dexterity difficulty and pronator drift positive on the right.  Bulk was normal and fasciculations were absent.   Motor Tone - Muscle tone was assessed at the neck and appendages and was normal.  Reflexes - The patient's reflexes were 1+ in all extremities and Casey Harrell had no pathological reflexes.  Sensory - Light touch, temperature/pinprick were assessed and were normal.    Coordination - The patient had mild dysmetria on the right FTN proportional to her weakness.  Tremor was absent.  Gait and Station - walk with walker but no significant hemiparetic gait, slow but steady.  Data reviewed: I personally reviewed the images and agree with the radiology interpretations.  Dg Chest 2 View 03/31/2015 Cardiomegaly without acute process. No interval change.   Ct Head Wo Contrast 03/31/2015 Findings concerning for recent/ possibly acute infarct in the medial posterior right occipital lobe. Prior infarct posterior left occipital lobe. There is atrophy with patchy small vessel disease in the periventricular white matter bilaterally. No hemorrhage or mass effect. Sinusitis with air-fluid level in the right maxillary antrum.   MRI HEAD  04/01/2015 IMPRESSION: 1. Small patchy acute ischemic infarcts involving the left periatrial white matter and left temporoparietal region. 2.  Remote bilateral occipital infarcts, left greater than right, with scattered remote lacunar infarcts as above. 3. Cerebral atrophy with moderate chronic small vessel ischemic disease.  MRA HEAD  04/01/2015 IMPRESSION: 1. No large or proximal arterial branch occlusion. 2. Focal high-grade stenosis within the proximal left M1 segment. 3. Extensive atheromatous throughout the posterior circulation, with focal severe stenoses within the left V4 segment, proximal right SCA, and mid left P2 segment.  CT head without contrast 04/02/2015 Bilateral remote occipital lobe infarcts with chronic involutional change. Known small patchy acute infarcts left hemisphere seen on MRI performed 04/01/2015 not well appreciated on CT.  MRI HEAD without contrast 04/02/2015 1. Interval extension in the recently identified left periatrial and temporoparietal infarcts, with multiple scattered new ischemic infarcts involving the left temporoparietal region and left occipital lobe. This most likely resulted from recent hypotensive episode in the setting of known high-grade stenoses in the left M1 and/or P2 segments as seen on recent MRA. 2. Otherwise stable appearance of the brain.  EEG - This EEG is abnormal with mild generalized nonspecific continuous slowing of cerebral activity. No evidence of an epileptic disorder was demonstrated.  CUS - Bilateral: 1-39% ICA stenosis. Vertebral artery flow is antegrade.  TTE - - Left ventricle: The cavity size was normal. Systolic function was normal. The estimated ejection fraction was in the range of 60% to 65%. Wall motion was normal; there were no regional wall motion abnormalities. Features are  consistent with a pseudonormal left ventricular filling pattern, with concomitant abnormal relaxation and increased filling pressure (grade 2 diastolic dysfunction). Doppler parameters are consistent with high ventricular filling pressure. - Aortic valve: Trileaflet;  mildly thickened, mildly calcified leaflets. - Mitral valve: Calcified annulus. There was trivial regurgitation. - Atrial septum: There was increased thickness of the septum, consistent with lipomatous hypertrophy. - Pulmonic valve: There was trivial regurgitation.  Component     Latest Ref Rng 12/25/2014 03/25/2015  Total Bilirubin     0.2 - 1.2 mg/dL  0.4  Bilirubin, Direct     0.0 - 0.3 mg/dL  0.1  Alkaline Phosphatase     39 - 117 U/L  87  AST     0 - 37 U/L  29  ALT     0 - 35 U/L  22  Total Protein     6.0 - 8.3 g/dL  7.2  Albumin     3.5 - 5.2 g/dL  4.0  Cholesterol     0 - 200 mg/dL  93  Triglycerides     0.0 - 149.0 mg/dL  409.8 (H)  HDL Cholesterol     >39.00 mg/dL  11.91 (L)  VLDL     0.0 - 40.0 mg/dL  47.8 (H)  Total CHOL/HDL Ratio       5  NonHDL       72.56  TSH     0.35 - 4.50 uIU/mL 2.88 1.78  Hemoglobin A1C     4.6 - 6.5 % 8.7 (H) 9.0 (H)  Direct LDL       47.0    Assessment: As you may recall, Casey Harrell is a 81 y.o. Caucasian female with PMH of DM, HTN, asthma, PVD, COPD was admitted on 03/31/15 for AMS following diarrhea at home. MRI showed small patchy left MCA infarcts with remote bilateral occipital infarcts, embolic pattern but could be due to multiple intracranial stenosis in MRA which showed segmental L M1 stenosis as well as focal severe stenoses L V4 segment, proximal R SCA, and mid L P2 segment. CUS and TTE unremarkable. LDL 76 and A1C 9.0. EEG no seizure. Mental status improved overnight, considering encephalopathy due to multiple factors, such as stroke, dehydration, diarrhea and hypertensive urgency. On 04/02/15 pt developed orthostatic hypotension with neuro changes, MRI showed patchy extension of left MCA infarcts, consistent with low BP in the setting of left M1 stenosis. Received fluid resuscitation, her aphasia resolved but Casey Harrell still has right hemiparesis. Casey Harrell was put on dual antiplatelet and lipitor, recommended 30 day monitoring. Casey Harrell was  discharged to CIR after stabilization. During the interval time, Casey Harrell had dramatic improvement of her right UE and LE. BP improved and pending 30 day cardiac event monitoring.  Plan:  - continue ASA and plavix for total 3 months and then plavix alone - continue lipitor for stroke prevention - continue PT/OT/speech with HH - pending 30 day cardiac event monitoring - check BP at home, goal BP 120-150, avoid low BP - Follow up with your primary care physician for stroke risk factor modification. Recommend maintain blood pressure goal 120-150, diabetes with hemoglobin A1c goal below 6.5% and lipids with LDL cholesterol goal below 70 mg/dL.  - check glucose at home. - follow up in 3 months.  I spent more than 25 minutes of face to face time with the patient. Greater than 50% of time was spent in counseling and coordination of care. We have discussed about continuing therapies, medication compliance and risk factor control  as well as avoid low BP.   No orders of the defined types were placed in this encounter.    No orders of the defined types were placed in this encounter.    Patient Instructions  - continue ASA and plavix for another 2 months and then plavix alone - continue lipitor for stroke prevention - continue PT/OT/speech  - follow up with cardiology for 30 day monitoring - check BP at home, goal BP 120-150, avoid low BP - Follow up with your primary care physician for stroke risk factor modification. Recommend maintain blood pressure goal <130/80, diabetes with hemoglobin A1c goal below 6.5% and lipids with LDL cholesterol goal below 70 mg/dL.  - check glucose at home. - follow up in 3 months.    Marvel Plan, MD PhD [facility] [address] [place], [place] [address] [phone]

## 2015-05-20 NOTE — Patient Instructions (Signed)
-   continue ASA and plavix for another 2 months and then plavix alone - continue lipitor for stroke prevention - continue PT/OT/speech  - follow up with cardiology for 30 day monitoring - check BP at home, goal BP 120-150, avoid low BP - Follow up with your primary care physician for stroke risk factor modification. Recommend maintain blood pressure goal <130/80, diabetes with hemoglobin A1c goal below 6.5% and lipids with LDL cholesterol goal below 70 mg/dL.  - check glucose at home. - follow up in 3 months.

## 2015-05-20 NOTE — Telephone Encounter (Signed)
Pt was late but was seen today by Dr. Roda ShuttersXu for hospital follow up appt for stroke.

## 2015-05-21 ENCOUNTER — Ambulatory Visit: Payer: Medicare Other | Admitting: Family Medicine

## 2015-05-25 ENCOUNTER — Ambulatory Visit (INDEPENDENT_AMBULATORY_CARE_PROVIDER_SITE_OTHER): Payer: Medicare Other | Admitting: Cardiovascular Disease

## 2015-05-25 ENCOUNTER — Encounter: Payer: Self-pay | Admitting: Cardiovascular Disease

## 2015-05-25 VITALS — BP 138/64 | HR 62 | Ht 61.0 in | Wt 144.0 lb

## 2015-05-25 DIAGNOSIS — I10 Essential (primary) hypertension: Secondary | ICD-10-CM | POA: Diagnosis not present

## 2015-05-25 DIAGNOSIS — I739 Peripheral vascular disease, unspecified: Secondary | ICD-10-CM | POA: Diagnosis not present

## 2015-05-25 DIAGNOSIS — E785 Hyperlipidemia, unspecified: Secondary | ICD-10-CM

## 2015-05-25 NOTE — Progress Notes (Signed)
05/25/2015 Casey Harrell   [birth year]  [id number]  Primary Physician Casey Covey, MD Primary Cardiologist: Casey Gess MD Casey Harrell   HPI:    The patient is a 81 year old, mildly overweight, married Caucasian female, mother of 2, grandmother to 4 grandchildren who I last saw 07/09/13. She is accompanied by her husband today. Her problems include hypertension, hyperlipidemia and noninsulin-requiring diabetes. She had a negative Myoview February 2011. Carotid Dopplers May 26, 2009, showed high-grade right ICA stenosis. I referred her to Casey Harrell who performed an uncomplicated right carotid endarterectomy. Her most recent carotid Doppler studies performed in the hospital setting of recent stroke showed widely patent endarterectomy sites. Her most recent lipid profile performed 04/01/15 was excellent with total cholesterol 137, LDL 76 and HDL 26. Since I saw her she's had a stroke and was followed by outpatient neurology. She's also had small bowel obstruction the last 2 weeks which resolved with conservative measures. She denies chest pain or shortness of breath.   Current Outpatient Prescriptions  Medication Sig Dispense Refill  . albuterol (ACCUNEB) 1.25 MG/3ML nebulizer solution Take 3 mLs (1.25 mg total) by nebulization every 4 (four) hours as needed for wheezing. Reported on 04/26/2015 75 mL 12  . aspirin 81 MG tablet Take 81 mg by mouth daily.      Marland Kitchen atorvastatin (LIPITOR) 40 MG tablet TAKE 1 TABLET BY MOUTH EVERY DAY AT 6 PM  0  . B-D ULTRAFINE III SHORT PEN 31G X 8 MM MISC USE AS DIRECTED 100 each 1  . budesonide (PULMICORT) 0.25 MG/2ML nebulizer solution Take 2 mLs (0.25 mg total) by nebulization 2 (two) times daily. 60 mL 1  . Calcium Citrate (CITRACAL PO) Take 1 tablet by mouth 2 (two) times daily. Reported on 04/26/2015    . carvedilol (COREG) 6.25 MG tablet TAKE 1 TABLET (6.25 MG TOTAL) BY MOUTH 2 (TWO) TIMES DAILY WITH A MEAL. 60 tablet 2  .  Cholecalciferol (VITAMIN D3) 2000 UNITS TABS Take 2,000 Units by mouth daily. Reported on 04/26/2015    . clopidogrel (PLAVIX) 75 MG tablet Take 1 tablet (75 mg total) by mouth daily. 30 tablet 0  . DULoxetine (CYMBALTA) 60 MG capsule TAKE 1 CAPSULE BY MOUTH DAILY 90 capsule 1  . fluticasone (VERAMYST) 27.5 MCG/SPRAY nasal spray Place 2 sprays into the nose daily. Reported on 04/26/2015    . HUMALOG KWIKPEN 100 UNIT/ML KiwkPen INJECT 9-14 UNITS SUB-Q TWICE A DAY WITH A MEAL. 9U BEFORE BREAKFAST AND 14 UNITS AFTER SUPPER  5  . Insulin Detemir (LEVEMIR FLEXTOUCH) 100 UNIT/ML Pen Inject 40 Units into the skin 2 (two) times daily. (Patient taking differently: Inject 30 Units into the skin 2 (two) times daily. ) 15 mL 5  . ipratropium-albuterol (DUONEB) 0.5-2.5 (3) MG/3ML SOLN Take 3 mLs by nebulization every 6 (six) hours as needed (shortness of breath or wheezing). 360 mL 0  . Lancets MISC     . levothyroxine (SYNTHROID, LEVOTHROID) 50 MCG tablet Take 1 tablet (50 mcg total) by mouth daily before breakfast. 90 tablet 0  . LYRICA 150 MG capsule TAKE ONE CAPSULE BY MOUTH EVERY DAY 90 capsule 1  . magnesium oxide (MAG-OX) 400 (241.3 MG) MG tablet TAKE 1 TABLET BY MOUTH DAILY 30 tablet 3  . meclizine (ANTIVERT) 25 MG tablet Take 25 mg by mouth 3 (three) times daily as needed for dizziness.    . metFORMIN (GLUCOPHAGE) 1000 MG tablet TAKE 1 TABLET BY MOUTH TWICE A  DAY WITH MEAL  10  . NOVOLOG FLEXPEN 100 UNIT/ML FlexPen INJECT 9 TO 14 UNITS UNDER THE SKIN 2 TIMES DAILY WITH A MEAL 9 UNITS BEFORE BREAKFAST AND 14 UNITS AFTER SUPPER 100 mL 0  . ondansetron (ZOFRAN ODT) 4 MG disintegrating tablet Take 1 tablet (4 mg total) by mouth every 8 (eight) hours as needed for nausea or vomiting. 10 tablet 0  . pantoprazole (PROTONIX) 40 MG tablet TAKE 1 TABLET BY MOUTH TWICE DAILY 180 tablet 1  . valsartan (DIOVAN) 160 MG tablet Take 160 mg by mouth daily.  3   No current facility-administered medications for this  visit.    Allergies  Allergen Reactions  . Doxycycline Hyclate Nausea And Vomiting  . Morphine Sulfate Other (See Comments)    REACTION: hallucinations  . Iohexol Hives     Code: HIVES, Desc: pt. states she breaks out in hives 06/15/08   . Moxifloxacin Other (See Comments)    REACTION: unknown    Social History   Social History  . Marital Status: Married    Spouse Name: N/A  . Number of Children: Y  . Years of Education: N/A   Occupational History  . stay at home    Social History Main Topics  . Smoking status: Never Smoker   . Smokeless tobacco: Never Used  . Alcohol Use: No  . Drug Use: Not on file  . Sexual Activity: Not on file   Other Topics Concern  . Not on file   Social History Narrative     Review of Systems: General: negative for chills, fever, night sweats or weight changes.  Cardiovascular: negative for chest pain, dyspnea on exertion, edema, orthopnea, palpitations, paroxysmal nocturnal dyspnea or shortness of breath Dermatological: negative for rash Respiratory: negative for cough or wheezing Urologic: negative for hematuria Abdominal: negative for nausea, vomiting, diarrhea, bright red blood per rectum, melena, or hematemesis Neurologic: negative for visual changes, syncope, or dizziness All other systems reviewed and are otherwise negative except as noted above.    Blood pressure 138/64, pulse 62, height  (1.549 m), weight 144 lb (65.318 kg).  General appearance: alert and no distress Neck: no adenopathy, no carotid bruit, no JVD, supple, symmetrical, trachea midline and thyroid not enlarged, symmetric, no tenderness/mass/nodules Lungs: clear to auscultation bilaterally Heart: regular rate and rhythm, S1, S2 normal, no murmur, click, rub or gallop Extremities: extremities normal, atraumatic, no cyanosis or edema  EKG not performed today  ASSESSMENT AND PLAN:   PVD (peripheral vascular disease)- S/P RCE 06/18/09 Status post elective  right carotid endarterectomy performed by Dr. Myra Harrell  in 2011 which we have been following by duplex ultrasound. She apparently recently had a stroke back in November and is followed by neurologist. 2-D echo and carotid Dopplers were unrevealing. I'm going to obtain a 3 day event monitor to rule out rule out occult A. fib  Essential hypertension History of hypertension blood pressure measured at 138/64. She is on Diovan and carvedilol. Continue current meds at current dosing  Dyslipidemia History of dyslipidemia on atorvastatin or Lipitor profile performed 04/01/15 revealed total cholesterol 137, LDL 76 and HDL of 26      Casey Gess MD [facility], [facility] 05/25/2015 8:24 AM

## 2015-05-25 NOTE — Assessment & Plan Note (Signed)
History of hypertension blood pressure measured at 138/64. She is on Diovan and carvedilol. Continue current meds at current dosing

## 2015-05-25 NOTE — Patient Instructions (Signed)
Medication Instructions:  Your physician recommends that you continue on your current medications as directed. Please refer to the Current Medication list given to you today.   Labwork: none  Testing/Procedures: Your physician has recommended that you wear an event monitor. Event monitors are medical devices that record the heart's electrical activity. Doctors most often [place] these monitors to diagnose arrhythmias. Arrhythmias are problems with the speed or rhythm of the heartbeat. The monitor is a small, portable device. You can wear one while you do your normal daily activities. This is usually used to diagnose what is causing palpitations/syncope (passing out).    Follow-Up: Your physician wants you to follow-up in: 12 months with Dr. Ladona Ridgel. You will receive a reminder letter in the mail two months in advance. If you don't receive a letter, please call our office to schedule the follow-up appointment.   Any Other Special Instructions Will Be Listed Below (If Applicable).     If you need a refill on your cardiac medications before your next appointment, please call your pharmacy.

## 2015-05-25 NOTE — Assessment & Plan Note (Signed)
Status post elective right carotid endarterectomy performed by Dr. Myra Gianotti  in 2011 which we have been following by duplex ultrasound. She apparently recently had a stroke back in November and is followed by neurologist. 2-D echo and carotid Dopplers were unrevealing. I'm going to obtain a 3 day event monitor to rule out rule out occult A. fib

## 2015-05-25 NOTE — Assessment & Plan Note (Signed)
History of dyslipidemia on atorvastatin or Lipitor profile performed 04/01/15 revealed total cholesterol 137, LDL 76 and HDL of 26

## 2015-05-26 ENCOUNTER — Ambulatory Visit (INDEPENDENT_AMBULATORY_CARE_PROVIDER_SITE_OTHER): Payer: Medicare Other

## 2015-05-26 ENCOUNTER — Encounter: Payer: Self-pay | Admitting: Family Medicine

## 2015-05-26 ENCOUNTER — Ambulatory Visit (INDEPENDENT_AMBULATORY_CARE_PROVIDER_SITE_OTHER): Payer: Medicare Other | Admitting: Family Medicine

## 2015-05-26 VITALS — BP 140/90 | HR 63 | Temp 98.1°F | Ht 61.0 in | Wt 148.1 lb

## 2015-05-26 DIAGNOSIS — K5669 Other intestinal obstruction: Secondary | ICD-10-CM

## 2015-05-26 DIAGNOSIS — Z8673 Personal history of transient ischemic attack (TIA), and cerebral infarction without residual deficits: Secondary | ICD-10-CM | POA: Diagnosis not present

## 2015-05-26 DIAGNOSIS — E785 Hyperlipidemia, unspecified: Secondary | ICD-10-CM | POA: Diagnosis not present

## 2015-05-26 DIAGNOSIS — I739 Peripheral vascular disease, unspecified: Secondary | ICD-10-CM | POA: Diagnosis not present

## 2015-05-26 DIAGNOSIS — I10 Essential (primary) hypertension: Secondary | ICD-10-CM | POA: Diagnosis not present

## 2015-05-26 DIAGNOSIS — E1159 Type 2 diabetes mellitus with other circulatory complications: Secondary | ICD-10-CM

## 2015-05-26 DIAGNOSIS — K56600 Partial intestinal obstruction, unspecified as to cause: Secondary | ICD-10-CM

## 2015-05-26 MED ORDER — METFORMIN HCL 1000 MG PO TABS: 1000.0000 mg | ORAL_TABLET | Freq: Two times a day (BID) | ORAL | Status: DC

## 2015-05-26 MED ORDER — CARVEDILOL 6.25 MG PO TABS: 6.2500 mg | ORAL_TABLET | Freq: Two times a day (BID) | ORAL | Status: DC

## 2015-05-26 MED ORDER — ATORVASTATIN CALCIUM 40 MG PO TABS: 40.0000 mg | ORAL_TABLET | ORAL | Status: DC

## 2015-05-26 MED ORDER — CLOPIDOGREL BISULFATE 75 MG PO TABS: 75.0000 mg | ORAL_TABLET | Freq: Every day | ORAL | Status: DC

## 2015-05-26 NOTE — Progress Notes (Signed)
Subjective:    Patient ID: Casey Harrell, female    DOB: 1934-12-11, 81 y.o.   MRN: [id number]  HPI Patient is seen for hospital follow-up. She was admitted on 05/07/2015 and discharged on 05/13/2015. She just got out of hospital December 13 following acute stroke. Day prior to admission she developed some dysuria and nausea, vomiting, abdominal pain. Had not had a bowel movement in about 3 days. Denied any fever. Presented to emergency department CT revealed partial small bowel obstruction. She has had several abdominal surgeries in the past and also prior history of lysis of adhesions.  Gen. surgery was consulted. NG tube was placed. She showed slow improvement over several days. Her diet was gradually advanced. Follow-up abdominal films were improved. She did have some elevated blood pressure during admission but has been improved since discharge. Her granddaughter who accompanies her today states that most of her blood pressures have been below 140/80. Her blood sugars vary considerably. History of poor compliance with diet. No recent hypoglycemia. She has had recent follow-ups with both cardiology and neurology and no medication changes were made. She's not had any recurrent dysuria since discharge.  Past Medical History  Diagnosis Date  . DIABETES-TYPE 2 07/15/2008  . DIABETIC PERIPHERAL NEUROPATHY 09/02/2009  . HYPERTENSION 07/15/2008  . ASTHMA 07/15/2008  . GERD 07/15/2008  . PVD (peripheral vascular disease) (HCC) 06/18/09    RCE  . Normal cardiac stress test     low risk Nuc 2009, Feb 2011  . COPD (chronic obstructive pulmonary disease) (HCC)   . Vertigo   . Stroke [facility])    Past Surgical History  Procedure Laterality Date  . Appendectomy  1971  . Abdominal hysterectomy  1971  . Tonsillectomy    . Tubal ligation    . Cesarean section    . Flexible sigmoidoscopy      diverticulitis  . Laparotomy      X 3 with adhesions lysis  . Foot surgery      right foot X 2  . Carotid  endarterectomy  06/18/09    RCE  . Colectomy and colostomy      reports that she has never smoked. She has never used smokeless tobacco. She reports that she does not drink alcohol. Her drug history is not on file. family history includes Arthritis in her mother; Asthma in her sister; Cancer in her paternal grandfather; Cancer (age of onset: 76) in her brother; Celiac disease in her sister; Diabetes in her mother; Heart disease in her brother, father, mother, sister, and sister; Stroke in her maternal grandfather. Allergies  Allergen Reactions  . Doxycycline Hyclate Nausea And Vomiting  . Morphine Sulfate Other (See Comments)    REACTION: hallucinations  . Iohexol Hives     Code: HIVES, Desc: pt. states she breaks out in hives 06/15/08   . Moxifloxacin Other (See Comments)    REACTION: unknown      Review of Systems  Constitutional: Positive for fatigue. Negative for unexpected weight change.  Eyes: Negative for visual disturbance.  Respiratory: Negative for cough, chest tightness, shortness of breath and wheezing.   Cardiovascular: Negative for chest pain, palpitations and leg swelling.  Gastrointestinal: Negative for abdominal pain.  Endocrine: Negative for polydipsia and polyuria.  Genitourinary: Negative for dysuria.  Neurological: Negative for dizziness, seizures, syncope, weakness, light-headedness and headaches.  Psychiatric/Behavioral: Negative for confusion.       Objective:   Physical Exam  Constitutional: She is oriented to person, place, and time.  She appears well-developed and well-nourished.  Cardiovascular: Normal rate and regular rhythm.   Pulmonary/Chest: Effort normal and breath sounds normal. No respiratory distress. She has no wheezes. She has no rales.  Abdominal: Soft. Bowel sounds are normal. She exhibits no distension and no mass. There is no tenderness. There is no rebound and no guarding.  Neurological: She is alert and oriented to person, place, and  time. No cranial nerve deficit.  Psychiatric: She has a normal mood and affect. Her behavior is normal.          Assessment & Plan:  #1 recent partial small bowel obstruction. Clinically improved. Follow-up for any recurrent abdominal distention, nausea/vomiting, or abdominal pain  #2 type 2 diabetes. History of poor control. History of poor compliance. She has follow-up in February and repeat hemoglobin A1c then  #3 hypertension. Improved compared with recent admission. Continue close monitoring  #4 recent stroke. Recovering well. Continue home physical therapy

## 2015-05-26 NOTE — Addendum Note (Signed)
Addended by: Theressa Stamps on: 05/26/2015 09:05 AM   Modules accepted: Orders

## 2015-05-26 NOTE — Progress Notes (Signed)
Pre visit review using our clinic review tool, if applicable. No additional management support is needed unless otherwise documented below in the visit note. 

## 2015-06-02 ENCOUNTER — Telehealth: Payer: Self-pay | Admitting: Family Medicine

## 2015-06-02 NOTE — Telephone Encounter (Signed)
If she is having pain- that may be elevating her blood pressure. Would not make any changes in blood pressure medication yet. If she has any fever, abdominal distention, vomiting, confusion, or progressive abdominal pain needs to go to ED for prompt evaluation. Would consume at this point only clear liquids for the next several hours to see which way things go

## 2015-06-02 NOTE — Telephone Encounter (Signed)
Left message for Casey Harrell to call back.

## 2015-06-02 NOTE — Telephone Encounter (Signed)
Pt seen on 05/26/15 for same sx/hospital follow up

## 2015-06-02 NOTE — Telephone Encounter (Signed)
Casey Harrell is aware of annotations. She will reach out to patient with information.

## 2015-06-02 NOTE — Telephone Encounter (Signed)
Spoke to Hargill from [employer]  Pt woke this morning with a high blood pressure (172/98) and abdominal pain  Recently admitted to the hospital for bowel obstruction.  Pt is having normal bowel movements.  No fever.  Pt is nauseated and weak.  Would like to know what to do next.  Please advise.  Thanks!

## 2015-06-04 ENCOUNTER — Telehealth: Payer: Self-pay

## 2015-06-04 DIAGNOSIS — I63513 Cerebral infarction due to unspecified occlusion or stenosis of bilateral middle cerebral arteries: Secondary | ICD-10-CM

## 2015-06-04 NOTE — Telephone Encounter (Signed)
Casey Harrell is requesting an order for out patient PT to continue working on balance. The fax number for the order is [phone]. Lanora Manis can be reached at [phone].

## 2015-06-04 NOTE — Telephone Encounter (Signed)
Orders placed.

## 2015-06-07 ENCOUNTER — Telehealth: Payer: Self-pay | Admitting: Neurology

## 2015-06-07 NOTE — Telephone Encounter (Signed)
Meaghan Dome/Granddaughter [phone] called to advise heart monitor that was ordered by [name] is saying "your monitor has detected an internal error". Per nurse Greta Doom advised to contact office that placed the monitor. Meaghan will contact Dr. Hazle Coca office.

## 2015-06-11 ENCOUNTER — Telehealth: Payer: Self-pay

## 2015-06-11 ENCOUNTER — Ambulatory Visit (INDEPENDENT_AMBULATORY_CARE_PROVIDER_SITE_OTHER): Payer: Medicare Other | Admitting: Cardiovascular Disease

## 2015-06-11 ENCOUNTER — Encounter: Payer: Self-pay | Admitting: Cardiovascular Disease

## 2015-06-11 VITALS — BP 136/72 | HR 60 | Ht 61.0 in | Wt 144.0 lb

## 2015-06-11 DIAGNOSIS — I480 Paroxysmal atrial fibrillation: Secondary | ICD-10-CM | POA: Diagnosis not present

## 2015-06-11 MED ORDER — APIXABAN 5 MG PO TABS: 5.0000 mg | ORAL_TABLET | Freq: Two times a day (BID) | ORAL | Status: DC

## 2015-06-11 NOTE — Assessment & Plan Note (Addendum)
Casey Harrell  returns today [name].for follow-up of her 30 day event monitor which showed episodes of paroxysmal A. Fib. She has had embolic stroke back in November. She was placed on aspirin and Plavix. The CHA2DSVASC2 score is  7. I'm going to stop these and begin her on a novel oral anticoagulant. I discussed this with her neurologist Dr.[name].

## 2015-06-11 NOTE — Telephone Encounter (Signed)
Pt made aware abnormal monitor results Scheduled for appt on 2/3 at 1:45p

## 2015-06-11 NOTE — Patient Instructions (Signed)
Medication Instructions:  Your physician has recommended you make the following change in your medication:  1) STOP Aspirin 2) STOP Plavix 3) START Eliquis 5 mg tablet by mouth TWICE daily (samples and 30 day card given)   Labwork: none  Testing/Procedures: none  Follow-Up: Your physician wants you to follow-up in: 6 months with Dr. Allyson Sabal. You will receive a reminder letter in the mail two months in advance. If you don't receive a letter, please call our office to schedule the follow-up appointment.  **You can discontinue the use of your event monitor**  Any Other Special Instructions Will Be Listed Below (If Applicable).     If you need a refill on your cardiac medications before your next appointment, please call your pharmacy.

## 2015-06-11 NOTE — Progress Notes (Signed)
Casey Harrell returns today her follow-up with her event monitor done to rule out PAF because of recent embolic stroke. She did have runs of paroxysmal atrial fibrillation. This patients CHA2DS2-VASc Score and unadjusted Ischemic Stroke Rate (% per year) is equal to 10.8 % stroke rate/year from a score of 8. I spoke to her neurologist, [name], who agrees to stop her aspirin and Plavix and begin her on on a novel oral anticoagulant. I will see her back in 6 months.  Above score calculated as 1 point each if present [CHF, HTN, DM, Vascular=MI/PAD/Aortic Plaque, Age if 65-74, or Female] Above score calculated as 2 points each if present [Age > 75, or Stroke/TIA/TE]

## 2015-06-20 ENCOUNTER — Emergency Department (HOSPITAL_COMMUNITY): Payer: Medicare Other

## 2015-06-20 ENCOUNTER — Observation Stay (HOSPITAL_COMMUNITY)
Admission: EM | Admit: 2015-06-20 | Discharge: 2015-06-22 | Disposition: A | Discharge status: Home / Self Care | Payer: Medicare Other | Attending: Internal Medicine | Admitting: Internal Medicine

## 2015-06-20 ENCOUNTER — Encounter (HOSPITAL_COMMUNITY): Payer: Self-pay | Admitting: Emergency Medicine

## 2015-06-20 DIAGNOSIS — R42 Dizziness and giddiness: Principal | ICD-10-CM | POA: Insufficient documentation

## 2015-06-20 DIAGNOSIS — E785 Hyperlipidemia, unspecified: Secondary | ICD-10-CM | POA: Insufficient documentation

## 2015-06-20 DIAGNOSIS — I10 Essential (primary) hypertension: Secondary | ICD-10-CM | POA: Diagnosis not present

## 2015-06-20 DIAGNOSIS — J45909 Unspecified asthma, uncomplicated: Secondary | ICD-10-CM | POA: Diagnosis not present

## 2015-06-20 DIAGNOSIS — F329 Major depressive disorder, single episode, unspecified: Secondary | ICD-10-CM | POA: Diagnosis not present

## 2015-06-20 DIAGNOSIS — E039 Hypothyroidism, unspecified: Secondary | ICD-10-CM | POA: Insufficient documentation

## 2015-06-20 DIAGNOSIS — Z8673 Personal history of transient ischemic attack (TIA), and cerebral infarction without residual deficits: Secondary | ICD-10-CM | POA: Insufficient documentation

## 2015-06-20 DIAGNOSIS — I6350 Cerebral infarction due to unspecified occlusion or stenosis of unspecified cerebral artery: Secondary | ICD-10-CM | POA: Diagnosis present

## 2015-06-20 DIAGNOSIS — E1165 Type 2 diabetes mellitus with hyperglycemia: Secondary | ICD-10-CM | POA: Diagnosis not present

## 2015-06-20 DIAGNOSIS — J449 Chronic obstructive pulmonary disease, unspecified: Secondary | ICD-10-CM | POA: Insufficient documentation

## 2015-06-20 DIAGNOSIS — E1140 Type 2 diabetes mellitus with diabetic neuropathy, unspecified: Secondary | ICD-10-CM | POA: Diagnosis not present

## 2015-06-20 DIAGNOSIS — K219 Gastro-esophageal reflux disease without esophagitis: Secondary | ICD-10-CM | POA: Diagnosis not present

## 2015-06-20 DIAGNOSIS — E669 Obesity, unspecified: Secondary | ICD-10-CM | POA: Diagnosis not present

## 2015-06-20 DIAGNOSIS — I482 Chronic atrial fibrillation: Secondary | ICD-10-CM | POA: Diagnosis not present

## 2015-06-20 DIAGNOSIS — I480 Paroxysmal atrial fibrillation: Secondary | ICD-10-CM | POA: Diagnosis present

## 2015-06-20 DIAGNOSIS — I739 Peripheral vascular disease, unspecified: Secondary | ICD-10-CM | POA: Diagnosis present

## 2015-06-20 DIAGNOSIS — I639 Cerebral infarction, unspecified: Secondary | ICD-10-CM | POA: Diagnosis present

## 2015-06-20 DIAGNOSIS — IMO0002 Reserved for concepts with insufficient information to code with codable children: Secondary | ICD-10-CM | POA: Diagnosis present

## 2015-06-20 DIAGNOSIS — F32A Depression, unspecified: Secondary | ICD-10-CM | POA: Diagnosis present

## 2015-06-20 LAB — URINALYSIS, ROUTINE W REFLEX MICROSCOPIC
Bilirubin Urine: NEGATIVE
Glucose, UA: NEGATIVE mg/dL
Hgb urine dipstick: NEGATIVE
Ketones, ur: NEGATIVE mg/dL
Leukocytes, UA: NEGATIVE
Nitrite: NEGATIVE
Protein, ur: NEGATIVE mg/dL
Specific Gravity, Urine: 1.004 — ABNORMAL LOW (ref 1.005–1.030)
pH: 6.5 (ref 5.0–8.0)

## 2015-06-20 LAB — COMPREHENSIVE METABOLIC PANEL
ALT: 13 U/L — ABNORMAL LOW (ref 14–54)
AST: 19 U/L (ref 15–41)
Albumin: 3.6 g/dL (ref 3.5–5.0)
Alkaline Phosphatase: 83 U/L (ref 38–126)
Anion gap: 12 (ref 5–15)
BUN: 14 mg/dL (ref 6–20)
CO2: 26 mmol/L (ref 22–32)
Calcium: 9.9 mg/dL (ref 8.9–10.3)
Chloride: 106 mmol/L (ref 101–111)
Creatinine, Ser: 0.69 mg/dL (ref 0.44–1.00)
GFR calc Af Amer: >60 mL/min (ref >60)
GFR calc non Af Amer: >60 mL/min (ref >60)
Glucose, Bld: 82 mg/dL (ref 65–99)
Potassium: 4.2 mmol/L (ref 3.5–5.1)
Sodium: 144 mmol/L (ref 135–145)
Total Bilirubin: 0.3 mg/dL (ref 0.3–1.2)
Total Protein: 7.2 g/dL (ref 6.5–8.1)

## 2015-06-20 LAB — CBC
HCT: 38.2 % (ref 36.0–46.0)
Hemoglobin: 12.2 g/dL (ref 12.0–15.0)
MCH: 26.1 pg (ref 26.0–34.0)
MCHC: 31.9 g/dL (ref 30.0–36.0)
MCV: 81.6 fL (ref 78.0–100.0)
Platelets: 286 10*3/uL (ref 150–400)
RBC: 4.68 MIL/uL (ref 3.87–5.11)
RDW: 17.5 % — ABNORMAL HIGH (ref 11.5–15.5)
WBC: 11.3 10*3/uL — ABNORMAL HIGH (ref 4.0–10.5)

## 2015-06-20 LAB — RAPID URINE DRUG SCREEN, HOSP PERFORMED
Amphetamines: NOT DETECTED
Barbiturates: NOT DETECTED
Benzodiazepines: NOT DETECTED
Cocaine: NOT DETECTED
Opiates: NOT DETECTED
Tetrahydrocannabinol: NOT DETECTED

## 2015-06-20 LAB — I-STAT CHEM 8, ED
BUN: 16 mg/dL (ref 6–20)
Calcium, Ion: 1.19 mmol/L (ref 1.13–1.30)
Chloride: 104 mmol/L (ref 101–111)
Creatinine, Ser: 0.8 mg/dL (ref 0.44–1.00)
Glucose, Bld: 76 mg/dL (ref 65–99)
HCT: 41 % (ref 36.0–46.0)
Hemoglobin: 13.9 g/dL (ref 12.0–15.0)
Potassium: 3.8 mmol/L (ref 3.5–5.1)
Sodium: 144 mmol/L (ref 135–145)
TCO2: 26 mmol/L (ref 0–100)

## 2015-06-20 LAB — ETHANOL: Alcohol, Ethyl (B): <5 mg/dL (ref <5)

## 2015-06-20 LAB — DIFFERENTIAL
Basophils Absolute: 0.1 10*3/uL (ref 0.0–0.1)
Basophils Relative: 0 %
Eosinophils Absolute: 0.7 10*3/uL (ref 0.0–0.7)
Eosinophils Relative: 6 %
Lymphocytes Relative: 41 %
Lymphs Abs: 4.6 10*3/uL — ABNORMAL HIGH (ref 0.7–4.0)
Monocytes Absolute: 1 10*3/uL (ref 0.1–1.0)
Monocytes Relative: 9 %
Neutro Abs: 5 10*3/uL (ref 1.7–7.7)
Neutrophils Relative %: 44 %

## 2015-06-20 LAB — CBG MONITORING, ED: Glucose-Capillary: 114 mg/dL — ABNORMAL HIGH (ref 65–99)

## 2015-06-20 LAB — I-STAT TROPONIN, ED: Troponin i, poc: 0 ng/mL (ref 0.00–0.08)

## 2015-06-20 LAB — PROTIME-INR
INR: 1.55 — ABNORMAL HIGH (ref 0.00–1.49)
Prothrombin Time: 18.7 seconds — ABNORMAL HIGH (ref 11.6–15.2)

## 2015-06-20 LAB — APTT: aPTT: 35 seconds (ref 24–37)

## 2015-06-20 MED ORDER — GADOBENATE DIMEGLUMINE 529 MG/ML IV SOLN
15.0000 mL | Freq: Once | INTRAVENOUS | Status: AC | PRN
  Administered 2015-06-20: 14 mL via INTRAVENOUS

## 2015-06-20 MED ORDER — LORAZEPAM 2 MG/ML IJ SOLN
1.0000 mg | Freq: Once | INTRAMUSCULAR | Status: AC
  Administered 2015-06-20: 1 mg via INTRAVENOUS
  Filled 2015-06-20: qty 1

## 2015-06-20 NOTE — ED Notes (Signed)
Patient transported to CT 

## 2015-06-20 NOTE — ED Provider Notes (Addendum)
CSN: [id number]     Arrival date & time 06/20/15  1816 History   First MD Initiated Contact with Patient 06/20/15 1826     Chief Complaint  Patient presents with  . Dizziness     (Consider location/radiation/quality/duration/timing/severity/associated sxs/prior Treatment) Patient is a 81 y.o. female presenting with dizziness. The history is provided by the patient, the EMS personnel and the spouse.  Dizziness Associated symptoms: weakness   Associated symptoms: no chest pain, no headaches and no shortness of breath    patient with a history of diabetes peripheral vascular disease hypertension vertigo and status post stroke in the fall. Patient is on blood thinners. Patient was admitted 03/31/2015 for posterior circulation stroke.  Since that time patient has had brief periods of vertigo and does have meclizine to take for that. But starting yesterday increase and more persistent episodes of vertigo. Today it was difficult to stand patient described generalized weakness and Feeling like she was going to fall over.  Patient denied headache denies fever denies feeling ill otherwise. Patient did state that the vertigo would seem to improve when she would close her eyes.  No history of any focal neuro deficit other than the vertigo no speech problems weakness was generalized.  Past Medical History  Diagnosis Date  . DIABETES-TYPE 2 07/15/2008  . DIABETIC PERIPHERAL NEUROPATHY 09/02/2009  . HYPERTENSION 07/15/2008  . ASTHMA 07/15/2008  . GERD 07/15/2008  . PVD (peripheral vascular disease) (HCC) 06/18/09    RCE  . Normal cardiac stress test     low risk Nuc 2009, Feb 2011  . COPD (chronic obstructive pulmonary disease) (HCC)   . Vertigo   . Stroke (HCC)   . Paroxysmal atrial fibrillation [facility])    Past Surgical History  Procedure Laterality Date  . Appendectomy  1971  . Abdominal hysterectomy  1971  . Tonsillectomy    . Tubal ligation    . Cesarean section    . Flexible sigmoidoscopy       diverticulitis  . Laparotomy      X 3 with adhesions lysis  . Foot surgery      right foot X 2  . Carotid endarterectomy  06/18/09    RCE  . Colectomy and colostomy     Family History  Problem Relation Age of Onset  . Diabetes Mother   . Arthritis Mother   . Heart disease Mother   . Heart disease Father   . Celiac disease Sister   . Stroke Maternal Grandfather   . Cancer Paternal Grandfather     lung  . Heart disease Sister   . Heart disease Sister   . Asthma Sister   . Heart disease Brother   . Cancer Brother 67    lung cancer   Social History  Substance Use Topics  . Smoking status: Never Smoker   . Smokeless tobacco: Never Used  . Alcohol Use: No   OB History    No data available     Review of Systems  Constitutional: Positive for fatigue. Negative for fever.  HENT: Negative for congestion.   Eyes: Positive for visual disturbance.  Respiratory: Negative for shortness of breath.   Cardiovascular: Negative for chest pain.  Gastrointestinal: Negative for abdominal pain.  Genitourinary: Negative for dysuria.  Musculoskeletal: Negative for back pain.  Skin: Negative for rash.  Neurological: Positive for dizziness and weakness. Negative for syncope, speech difficulty and headaches.  Hematological: Bruises/bleeds easily.  Psychiatric/Behavioral: Negative for confusion.  Allergies  Doxycycline hyclate; Morphine sulfate; Iohexol; and Moxifloxacin  Home Medications   Prior to Admission medications   Medication Sig Start Date End Date Taking? Authorizing Provider  albuterol (ACCUNEB) 1.25 MG/3ML nebulizer solution Take 3 mLs (1.25 mg total) by nebulization every 4 (four) hours as needed for wheezing. Reported on 04/26/2015 05/13/15   Jeralyn Bennett, MD  apixaban (ELIQUIS) 5 MG TABS tablet Take 1 tablet (5 mg total) by mouth 2 (two) times daily. 06/11/15   Runell Gess, MD  atorvastatin (LIPITOR) 40 MG tablet Take 1 tablet (40 mg total) by mouth 1 day or  1 dose. 05/26/15   [name], MD  budesonide (PULMICORT) 0.25 MG/2ML nebulizer solution Take 2 mLs (0.25 mg total) by nebulization 2 (two) times daily. 05/13/15   Jeralyn Bennett, MD  Calcium Citrate (CITRACAL PO) Take 1 tablet by mouth 2 (two) times daily. Reported on 04/26/2015    Historical Provider, MD  carvedilol (COREG) 6.25 MG tablet Take 1 tablet (6.25 mg total) by mouth 2 (two) times daily with a meal. 05/26/15   [name], MD  Cholecalciferol (VITAMIN D3) 2000 UNITS TABS Take 2,000 Units by mouth daily. Reported on 04/26/2015    Historical Provider, MD  DULoxetine (CYMBALTA) 60 MG capsule TAKE 1 CAPSULE BY MOUTH DAILY 01/15/15   [name], MD  HUMALOG KWIKPEN 100 UNIT/ML KiwkPen INJECT 9-14 UNITS SUB-Q TWICE A DAY WITH A MEAL. 9U BEFORE BREAKFAST AND 14 UNITS AFTER SUPPER 04/21/15   Historical Provider, MD  Insulin Detemir (LEVEMIR FLEXTOUCH) 100 UNIT/ML Pen Inject 40 Units into the skin 2 (two) times daily. Patient taking differently: Inject 30 Units into the skin 2 (two) times daily.  09/10/14   [name], MD  ipratropium-albuterol (DUONEB) 0.5-2.5 (3) MG/3ML SOLN Take 3 mLs by nebulization every 6 (six) hours as needed (shortness of breath or wheezing). 04/20/15   Jacquelynn Cree, PA-C  levothyroxine (SYNTHROID, LEVOTHROID) 50 MCG tablet Take 1 tablet (50 mcg total) by mouth daily before breakfast. 12/08/14   [name], MD  LYRICA 150 MG capsule TAKE ONE CAPSULE BY MOUTH EVERY DAY 04/28/15   [name], MD  meclizine (ANTIVERT) 25 MG tablet Take 25 mg by mouth 3 (three) times daily as needed for dizziness.    Historical Provider, MD  meclizine (ANTIVERT) 25 MG tablet Take 1 tablet (25 mg total) by mouth 3 (three) times daily as needed for dizziness. 06/21/15   Vanetta Mulders, MD  metFORMIN (GLUCOPHAGE) 1000 MG tablet Take 1 tablet (1,000 mg total) by mouth 2 (two) times daily with a meal. 05/26/15   [name], MD  ondansetron (ZOFRAN ODT) 4 MG  disintegrating tablet Take 1 tablet (4 mg total) by mouth every 8 (eight) hours as needed for nausea or vomiting. 04/09/14   Roxy Horseman, PA-C  pantoprazole (PROTONIX) 40 MG tablet TAKE 1 TABLET BY MOUTH TWICE DAILY 05/21/15   [name], MD  valsartan (DIOVAN) 160 MG tablet Take 160 mg by mouth daily. 02/03/15   Historical Provider, MD   BP 134/65 mmHg  Pulse 78  Temp(Src) 98.1 F (36.7 C) (Oral)  Resp 23  SpO2 93% Physical Exam  Constitutional: She is oriented to person, place, and time. She appears well-developed and well-nourished. No distress.  HENT:  Head: Normocephalic and atraumatic.  Right Ear: External ear normal.  Left Ear: External ear normal.  Mouth/Throat: Oropharynx is clear and moist.  Eyes: Conjunctivae and EOM are normal. Pupils  are equal, round, and reactive to light.  Neck: Normal range of motion. Neck supple.  Cardiovascular: Normal rate, regular rhythm and normal heart sounds.   No murmur heard. Pulmonary/Chest: Effort normal and breath sounds normal. No respiratory distress.  Abdominal: Soft. Bowel sounds are normal. There is no tenderness.  Musculoskeletal: Normal range of motion.  Neurological: She is alert and oriented to person, place, and time. No cranial nerve deficit. She exhibits normal muscle tone. Coordination normal.  No reproducible vertigo no nystagmus no significant upper extremity or lower extremity weakness. No speech problems.  Skin: Skin is warm.  Nursing note and vitals reviewed.   ED Course  Procedures (including critical care time) Labs Review Labs Reviewed  PROTIME-INR - Abnormal; Notable for the following:    Prothrombin Time 18.7 (*)    INR 1.55 (*)    All other components within normal limits  CBC - Abnormal; Notable for the following:    WBC 11.3 (*)    RDW 17.5 (*)    All other components within normal limits  DIFFERENTIAL - Abnormal; Notable for the following:    Lymphs Abs 4.6 (*)    All other components within  normal limits  COMPREHENSIVE METABOLIC PANEL - Abnormal; Notable for the following:    ALT 13 (*)    All other components within normal limits  URINALYSIS, ROUTINE W REFLEX MICROSCOPIC (NOT AT [facility]) - Abnormal; Notable for the following:    Specific Gravity, Urine 1.004 (*)    All other components within normal limits  CBG MONITORING, ED - Abnormal; Notable for the following:    Glucose-Capillary 114 (*)    All other components within normal limits  ETHANOL  APTT  URINE RAPID DRUG SCREEN, HOSP PERFORMED  I-STAT CHEM 8, ED  I-STAT TROPOININ, ED    Imaging Review Dg Chest 2 View  06/20/2015  CLINICAL DATA:  81 year old female with weakness and vertigo. EXAM: CHEST  2 VIEW COMPARISON:  Radiograph dated 04/19/2015 FINDINGS: Two views of the chest demonstrate minimal bibasilar atelectatic changes. There is no focal consolidation, pleural effusion, or pneumothorax. Stable cardiac silhouette. The osseous structures are grossly unremarkable. IMPRESSION: No active cardiopulmonary disease. Electronically Signed   By: Elgie Collard M.D.   On: 06/20/2015 20:13   Ct Head Wo Contrast  06/20/2015  CLINICAL DATA:  81 year old with personal history of stroke, current history of diabetes and hypertension, presenting with acute onset of vertigo which began 2 days ago. EXAM: CT HEAD WITHOUT CONTRAST TECHNIQUE: Contiguous axial images were obtained from the base of the skull through the vertex without intravenous contrast. COMPARISON:  CT head and MRI brain 04/02/2015 and earlier. FINDINGS: Head tilt in the gantry accounts for apparent asymmetry in the cerebral hemispheres. Old left posterior parietal cortical stroke with encephalomalacia, unchanged. Old subcortical stroke in the right occipital white matter, unchanged. Old lacunar stroke in the deep white matter of the right frontotemporal region adjacent to the atrium of the lateral ventricle, unchanged. Severe changes of small vessel disease of the white  matter diffusely, unchanged. Moderate cortical and deep atrophy, unchanged. No mass lesion. No midline shift. No acute hemorrhage or hematoma. No extra-axial fluid collections. No evidence of acute infarction. No skull fracture or other focal osseous abnormality involving the skull. Minimal fluid in the inferior right mastoid air cells, unchanged. Bilateral mastoid air cells and bilateral middle ear cavities well aerated otherwise. Mucosal thickening involving the maxillary sinuses, opacification of right posterior ethmoid air cells, and minimal mucosal thickening in  the sphenoid sinuses, unchanged. IMPRESSION: 1. No acute intracranial abnormality. 2. Multiple remote bilateral strokes, unchanged. 3. Stable moderate generalized atrophy and severe chronic microvascular ischemic changes of the white matter. 4. Minimal right mastoid effusion, unchanged. No evidence of otitis media. 5. Mild chronic pansinusitis, unchanged. Electronically Signed   By: Hulan Saas M.D.   On: 06/20/2015 20:12   Mr Maxine Glenn Head Wo Contrast  06/21/2015  CLINICAL DATA:  Near syncopal episode 3 hours ago. Vertigo for 2 days, not improving with meclizine. History of heart block. EXAM: MR HEAD WITHOUT CONTRAST MR CIRCLE OF WILLIS WITHOUT CONTRAST MRA OF THE NECK WITHOUT AND WITH CONTRAST TECHNIQUE: Multiplanar, multiecho pulse sequences of the brain and surrounding structures were obtained according to standard protocol without intravenous contrast.; Angiographic images of the Circle of Willis were obtained using MRA technique without intravenous contrast.; Multiplanar and multiecho pulse sequences of the neck were obtained without and with intravenous contrast. Angiographic images of the neck were obtained using MRA technique without and with intravenous contrast. CONTRAST:  14mL MULTIHANCE GADOBENATE DIMEGLUMINE 529 MG/ML IV SOLN COMPARISON:  CT head June 20, 2015 at 1923 hours and MRI of the brain April 02, 2015 and MRA head  April 01, 2015 FINDINGS: MR HEAD FINDINGS No reduced diffusion to suggest acute ischemia. Old micro hemorrhage LEFT pontomedullary junction. Ventricles and sulci are normal for patient's age. Patchy to confluent supratentorial white matter FLAIR T2 hyperintensities, to lesser extent within the pons. Bilateral occipital lobe encephalomalacia. Tiny bilateral basal ganglia and thalamus lacunar infarcts versus perivascular spaces. No midline shift, mass effect or mass lesions. No abnormal extra-axial fluid collections. Status post bilateral ocular lens implants. Mild paranasal sinus mucosal thickening without air-fluid levels. Trace RIGHT mastoid effusion. No abnormal sellar expansion. No cerebellar tonsillar ectopia. No suspicious calvarial bone marrow signal. MR CIRCLE OF WILLIS FINDINGS Mildly motion degraded examination. Anterior circulation: Normal flow related enhancement of the included cervical, petrous, cavernous and supraclinoid internal carotid arteries. Patent anterior communicating artery. High-grade stenosis LEFT M1 segment. Slow flow LEFT mid to distal segments. Normal appearance the anterior and RIGHT middle cerebral arteries. No large vessel occlusion, aneurysm. Posterior circulation: RIGHT vertebral artery is dominant. Slow flow in intermittent loss of flow related enhancement LEFT V4 segment is similar. Basilar artery is patent, with normal flow related enhancement of the main branch vessels. Flow related enhancement of the posterior cerebral arteries. Tandem moderate stenosis proximal RIGHT posterior cerebral artery. High-grade stenosis proximal LEFT P2 segment, vessel remains patent, subsequent moderate tandem stenosis. Robust RIGHT posterior communicating artery present. No aneurysm. MRA NECK FINDINGS Moderate motion degraded examination. Source images and MIP image were reviewed. The common carotid arteries are widely patent bilaterally. The carotid bifurcation is patent bilaterally and there  is no significant carotid stenosis. Mild luminal irregularity compatible with atherosclerosis. High-grade stenosis LEFT vertebral artery origin, mild stenosis RIGHT vertebral artery origin. High-grade stenosis LEFT V4 segment. IMPRESSION: MRI HEAD: No acute intracranial process, specifically no acute ischemia. Stable chronic changes including moderate to severe chronic small vessel ischemic disease. Bilateral occipital encephalomalacia compatible with old PCA territory infarct. MRA HEAD: Mild motion degraded examination. No emergent large vessel occlusion. Stable appearance, including high-grade stenosis LEFT M1 segment ; high-grade stenosis proximal LEFT P2 segment with moderate tandem stenosis bilateral posterior cerebral arteries. High-grade stenosis LEFT V4 segment. MRA NECK: Moderately motion degraded examination. High-grade stenosis LEFT vertebral artery. Patent carotid arteries. Electronically Signed   By: Awilda Metro M.D.   On: 06/21/2015 00:02   Mr  Angiogram Neck W Wo Contrast  06/21/2015  CLINICAL DATA:  Near syncopal episode 3 hours ago. Vertigo for 2 days, not improving with meclizine. History of heart block. EXAM: MR HEAD WITHOUT CONTRAST MR CIRCLE OF WILLIS WITHOUT CONTRAST MRA OF THE NECK WITHOUT AND WITH CONTRAST TECHNIQUE: Multiplanar, multiecho pulse sequences of the brain and surrounding structures were obtained according to standard protocol without intravenous contrast.; Angiographic images of the Circle of Willis were obtained using MRA technique without intravenous contrast.; Multiplanar and multiecho pulse sequences of the neck were obtained without and with intravenous contrast. Angiographic images of the neck were obtained using MRA technique without and with intravenous contrast. CONTRAST:  14mL MULTIHANCE GADOBENATE DIMEGLUMINE 529 MG/ML IV SOLN COMPARISON:  CT head June 20, 2015 at 1923 hours and MRI of the brain April 02, 2015 and MRA head April 01, 2015 FINDINGS: MR  HEAD FINDINGS No reduced diffusion to suggest acute ischemia. Old micro hemorrhage LEFT pontomedullary junction. Ventricles and sulci are normal for patient's age. Patchy to confluent supratentorial white matter FLAIR T2 hyperintensities, to lesser extent within the pons. Bilateral occipital lobe encephalomalacia. Tiny bilateral basal ganglia and thalamus lacunar infarcts versus perivascular spaces. No midline shift, mass effect or mass lesions. No abnormal extra-axial fluid collections. Status post bilateral ocular lens implants. Mild paranasal sinus mucosal thickening without air-fluid levels. Trace RIGHT mastoid effusion. No abnormal sellar expansion. No cerebellar tonsillar ectopia. No suspicious calvarial bone marrow signal. MR CIRCLE OF WILLIS FINDINGS Mildly motion degraded examination. Anterior circulation: Normal flow related enhancement of the included cervical, petrous, cavernous and supraclinoid internal carotid arteries. Patent anterior communicating artery. High-grade stenosis LEFT M1 segment. Slow flow LEFT mid to distal segments. Normal appearance the anterior and RIGHT middle cerebral arteries. No large vessel occlusion, aneurysm. Posterior circulation: RIGHT vertebral artery is dominant. Slow flow in intermittent loss of flow related enhancement LEFT V4 segment is similar. Basilar artery is patent, with normal flow related enhancement of the main branch vessels. Flow related enhancement of the posterior cerebral arteries. Tandem moderate stenosis proximal RIGHT posterior cerebral artery. High-grade stenosis proximal LEFT P2 segment, vessel remains patent, subsequent moderate tandem stenosis. Robust RIGHT posterior communicating artery present. No aneurysm. MRA NECK FINDINGS Moderate motion degraded examination. Source images and MIP image were reviewed. The common carotid arteries are widely patent bilaterally. The carotid bifurcation is patent bilaterally and there is no significant carotid  stenosis. Mild luminal irregularity compatible with atherosclerosis. High-grade stenosis LEFT vertebral artery origin, mild stenosis RIGHT vertebral artery origin. High-grade stenosis LEFT V4 segment. IMPRESSION: MRI HEAD: No acute intracranial process, specifically no acute ischemia. Stable chronic changes including moderate to severe chronic small vessel ischemic disease. Bilateral occipital encephalomalacia compatible with old PCA territory infarct. MRA HEAD: Mild motion degraded examination. No emergent large vessel occlusion. Stable appearance, including high-grade stenosis LEFT M1 segment ; high-grade stenosis proximal LEFT P2 segment with moderate tandem stenosis bilateral posterior cerebral arteries. High-grade stenosis LEFT V4 segment. MRA NECK: Moderately motion degraded examination. High-grade stenosis LEFT vertebral artery. Patent carotid arteries. Electronically Signed   By: Awilda Metro M.D.   On: 06/21/2015 00:02   Mr Brain Wo Contrast  06/21/2015  CLINICAL DATA:  Near syncopal episode 3 hours ago. Vertigo for 2 days, not improving with meclizine. History of heart block. EXAM: MR HEAD WITHOUT CONTRAST MR CIRCLE OF WILLIS WITHOUT CONTRAST MRA OF THE NECK WITHOUT AND WITH CONTRAST TECHNIQUE: Multiplanar, multiecho pulse sequences of the brain and surrounding structures were obtained according to standard protocol  without intravenous contrast.; Angiographic images of the Circle of Willis were obtained using MRA technique without intravenous contrast.; Multiplanar and multiecho pulse sequences of the neck were obtained without and with intravenous contrast. Angiographic images of the neck were obtained using MRA technique without and with intravenous contrast. CONTRAST:  14mL MULTIHANCE GADOBENATE DIMEGLUMINE 529 MG/ML IV SOLN COMPARISON:  CT head June 20, 2015 at 1923 hours and MRI of the brain April 02, 2015 and MRA head April 01, 2015 FINDINGS: MR HEAD FINDINGS No reduced diffusion to  suggest acute ischemia. Old micro hemorrhage LEFT pontomedullary junction. Ventricles and sulci are normal for patient's age. Patchy to confluent supratentorial white matter FLAIR T2 hyperintensities, to lesser extent within the pons. Bilateral occipital lobe encephalomalacia. Tiny bilateral basal ganglia and thalamus lacunar infarcts versus perivascular spaces. No midline shift, mass effect or mass lesions. No abnormal extra-axial fluid collections. Status post bilateral ocular lens implants. Mild paranasal sinus mucosal thickening without air-fluid levels. Trace RIGHT mastoid effusion. No abnormal sellar expansion. No cerebellar tonsillar ectopia. No suspicious calvarial bone marrow signal. MR CIRCLE OF WILLIS FINDINGS Mildly motion degraded examination. Anterior circulation: Normal flow related enhancement of the included cervical, petrous, cavernous and supraclinoid internal carotid arteries. Patent anterior communicating artery. High-grade stenosis LEFT M1 segment. Slow flow LEFT mid to distal segments. Normal appearance the anterior and RIGHT middle cerebral arteries. No large vessel occlusion, aneurysm. Posterior circulation: RIGHT vertebral artery is dominant. Slow flow in intermittent loss of flow related enhancement LEFT V4 segment is similar. Basilar artery is patent, with normal flow related enhancement of the main branch vessels. Flow related enhancement of the posterior cerebral arteries. Tandem moderate stenosis proximal RIGHT posterior cerebral artery. High-grade stenosis proximal LEFT P2 segment, vessel remains patent, subsequent moderate tandem stenosis. Robust RIGHT posterior communicating artery present. No aneurysm. MRA NECK FINDINGS Moderate motion degraded examination. Source images and MIP image were reviewed. The common carotid arteries are widely patent bilaterally. The carotid bifurcation is patent bilaterally and there is no significant carotid stenosis. Mild luminal irregularity  compatible with atherosclerosis. High-grade stenosis LEFT vertebral artery origin, mild stenosis RIGHT vertebral artery origin. High-grade stenosis LEFT V4 segment. IMPRESSION: MRI HEAD: No acute intracranial process, specifically no acute ischemia. Stable chronic changes including moderate to severe chronic small vessel ischemic disease. Bilateral occipital encephalomalacia compatible with old PCA territory infarct. MRA HEAD: Mild motion degraded examination. No emergent large vessel occlusion. Stable appearance, including high-grade stenosis LEFT M1 segment ; high-grade stenosis proximal LEFT P2 segment with moderate tandem stenosis bilateral posterior cerebral arteries. High-grade stenosis LEFT V4 segment. MRA NECK: Moderately motion degraded examination. High-grade stenosis LEFT vertebral artery. Patent carotid arteries. Electronically Signed   By: Awilda Metro M.D.   On: 06/21/2015 00:02   I have personally reviewed and evaluated these images and lab results as part of my medical decision-making.   EKG Interpretation   Date/Time:  Sunday June 20 2015 19:52:57 EST Ventricular Rate:  59 PR Interval:  191 QRS Duration: 82 QT Interval:  428 QTC Calculation: 424 R Axis:   20 Text Interpretation:  Sinus rhythm Low voltage, precordial leads Confirmed  by Marge Vandermeulen  MD, Tangie Stay ([id number]) on 06/20/2015 7:55:11 PM      MDM   Final diagnoses:  Vertigo    Patient status post strokes back in the fall. Patient was some brief intermittent problems with vertigo since then usually well controlled on meclizine. However patient with more severe vertigo starting yesterday making it extremely difficult for her to walk  and function. About 3 hours prior to this patient almost fell over or passed out do to feeling weak and not being able to stand well. Otherwise patient has had no recent illnesses.  Workup here was concern for possible recurrent stroke with worsening vertigo. Or perhaps posterior  circulation problems. Patient's head CT was negative. Patient was consult to to the neural hospitalist which agreed with my recommendation for MRI of the brain but they felt that it would be better to make it an MRA. Those have been ordered. Results are pending.   Patient received Ativan due to concerns with the MRI sounds she wasn't really claustrophobic. Patient received 1 mg of Ativan which has sedated her. Husband just informed us that patient had taken her insulin prior to coming in and they arrived at about 6:30 PM but she never ate dinner. We are going to check a blood sugar at this point in time. Her original labs showed a sugar of 86 it is possible she could be hypoglycemic.   Suspected neurology's recommendation will be if the MRI is negative that she can continue her treatment with her meclizine. And follow-up with her primary care doctor.    Vanetta Mulders, MD 06/20/15 [id number]  MRA MRI results without evidence of any acute findings. Will ambulate patient if she has improved and can stand will discharge home with continuing her anti-of her follow-up with her record Dr.'s. Discussed with the neural hospitalist. Patient is not stable on her feet or the vertigo reoccurs will arrange admission. Patient has not had any vertigo symptoms over the last couple hours. Patient's blood sugar ended up coming in at 115, which is stable despite the fact she did not have dinner.   Vanetta Mulders, MD 06/21/15 [phone]   The patient did not tolerate even sitting felt very lightheaded dizzy vertigo back patient will require admission. Neural hospitalist will follow up. Patient due to the vertebral narrowing may require interventional procedure.  Vanetta Mulders, MD 06/21/15 [phone]

## 2015-06-20 NOTE — ED Notes (Addendum)
Pt here from home with c.o. Vertigo X 2 days. Pt has hx of vertigo. Pt took her at home Meclizine without relief. 3 hours ago pt had a near syncopal episode after standing but her husband caught her. BP 170s/90s. Possible 1st degree AV block on monitor. PT denies pain. Vertigo is relieved when her eyes are closed. CBG 185. PIV 20G L wrist.

## 2015-06-20 NOTE — ED Notes (Signed)
Pt arrived via EMS with dizziness starting yesterday.  Pt has been taking meclizine at home.  Denies any pain, SOB or recent illness.

## 2015-06-21 ENCOUNTER — Ambulatory Visit: Payer: Medicare Other | Attending: Physical Medicine & Rehabilitation | Admitting: Physical Therapy

## 2015-06-21 DIAGNOSIS — K219 Gastro-esophageal reflux disease without esophagitis: Secondary | ICD-10-CM

## 2015-06-21 DIAGNOSIS — E039 Hypothyroidism, unspecified: Secondary | ICD-10-CM | POA: Diagnosis not present

## 2015-06-21 DIAGNOSIS — E1165 Type 2 diabetes mellitus with hyperglycemia: Secondary | ICD-10-CM | POA: Diagnosis not present

## 2015-06-21 DIAGNOSIS — F329 Major depressive disorder, single episode, unspecified: Secondary | ICD-10-CM | POA: Diagnosis not present

## 2015-06-21 DIAGNOSIS — E785 Hyperlipidemia, unspecified: Secondary | ICD-10-CM | POA: Diagnosis not present

## 2015-06-21 DIAGNOSIS — R2681 Unsteadiness on feet: Secondary | ICD-10-CM | POA: Diagnosis not present

## 2015-06-21 DIAGNOSIS — I6602 Occlusion and stenosis of left middle cerebral artery: Secondary | ICD-10-CM | POA: Diagnosis not present

## 2015-06-21 DIAGNOSIS — R42 Dizziness and giddiness: Secondary | ICD-10-CM | POA: Diagnosis not present

## 2015-06-21 DIAGNOSIS — I6502 Occlusion and stenosis of left vertebral artery: Secondary | ICD-10-CM

## 2015-06-21 DIAGNOSIS — E1140 Type 2 diabetes mellitus with diabetic neuropathy, unspecified: Secondary | ICD-10-CM | POA: Diagnosis not present

## 2015-06-21 DIAGNOSIS — I6350 Cerebral infarction due to unspecified occlusion or stenosis of unspecified cerebral artery: Secondary | ICD-10-CM

## 2015-06-21 LAB — GLUCOSE, CAPILLARY
Glucose-Capillary: 163 mg/dL — ABNORMAL HIGH (ref 65–99)
Glucose-Capillary: 214 mg/dL — ABNORMAL HIGH (ref 65–99)
Glucose-Capillary: 223 mg/dL — ABNORMAL HIGH (ref 65–99)
Glucose-Capillary: 243 mg/dL — ABNORMAL HIGH (ref 65–99)

## 2015-06-21 LAB — CBC
HCT: 40.6 % (ref 36.0–46.0)
Hemoglobin: 13.3 g/dL (ref 12.0–15.0)
MCH: 26.8 pg (ref 26.0–34.0)
MCHC: 32.8 g/dL (ref 30.0–36.0)
MCV: 81.7 fL (ref 78.0–100.0)
Platelets: 264 10*3/uL (ref 150–400)
RBC: 4.97 MIL/uL (ref 3.87–5.11)
RDW: 17.8 % — ABNORMAL HIGH (ref 11.5–15.5)
WBC: 11.4 10*3/uL — ABNORMAL HIGH (ref 4.0–10.5)

## 2015-06-21 LAB — COMPREHENSIVE METABOLIC PANEL
ALT: 14 U/L (ref 14–54)
ALT: 14 U/L (ref 14–54)
AST: 18 U/L (ref 15–41)
AST: 28 U/L (ref 15–41)
Albumin: 3.5 g/dL (ref 3.5–5.0)
Albumin: 3.3 g/dL — ABNORMAL LOW (ref 3.5–5.0)
Alkaline Phosphatase: 77 U/L (ref 38–126)
Alkaline Phosphatase: 81 U/L (ref 38–126)
Anion gap: 15 (ref 5–15)
Anion gap: 12 (ref 5–15)
BUN: 12 mg/dL (ref 6–20)
BUN: 13 mg/dL (ref 6–20)
CO2: 24 mmol/L (ref 22–32)
CO2: 22 mmol/L (ref 22–32)
Calcium: 9.5 mg/dL (ref 8.9–10.3)
Calcium: 9.1 mg/dL (ref 8.9–10.3)
Chloride: 102 mmol/L (ref 101–111)
Chloride: 106 mmol/L (ref 101–111)
Creatinine, Ser: 0.79 mg/dL (ref 0.44–1.00)
Creatinine, Ser: 0.98 mg/dL (ref 0.44–1.00)
GFR calc Af Amer: >60 mL/min (ref >60)
GFR calc Af Amer: >60 mL/min (ref >60)
GFR calc non Af Amer: >60 mL/min (ref >60)
GFR calc non Af Amer: 54 mL/min — ABNORMAL LOW (ref >60)
Glucose, Bld: 158 mg/dL — ABNORMAL HIGH (ref 65–99)
Glucose, Bld: 275 mg/dL — ABNORMAL HIGH (ref 65–99)
Potassium: 3.9 mmol/L (ref 3.5–5.1)
Potassium: 4.2 mmol/L (ref 3.5–5.1)
Sodium: 141 mmol/L (ref 135–145)
Sodium: 140 mmol/L (ref 135–145)
Total Bilirubin: 0.7 mg/dL (ref 0.3–1.2)
Total Bilirubin: 0.6 mg/dL (ref 0.3–1.2)
Total Protein: 6.6 g/dL (ref 6.5–8.1)
Total Protein: 6.2 g/dL — ABNORMAL LOW (ref 6.5–8.1)

## 2015-06-21 LAB — TROPONIN I
Troponin I: <0.03 ng/mL (ref <0.031)
Troponin I: <0.03 ng/mL (ref <0.031)

## 2015-06-21 LAB — BRAIN NATRIURETIC PEPTIDE: B Natriuretic Peptide: 34.2 pg/mL (ref 0.0–100.0)

## 2015-06-21 LAB — LIPASE, BLOOD: Lipase: 21 U/L (ref 11–51)

## 2015-06-21 MED ORDER — ZOLPIDEM TARTRATE 5 MG PO TABS
5.0000 mg | ORAL_TABLET | Freq: Once | ORAL | Status: AC
  Administered 2015-06-21: 5 mg via ORAL
  Filled 2015-06-21: qty 1

## 2015-06-21 MED ORDER — ALBUTEROL SULFATE (2.5 MG/3ML) 0.083% IN NEBU: 1.2500 mg | INHALATION_SOLUTION | RESPIRATORY_TRACT | Status: DC | PRN

## 2015-06-21 MED ORDER — HYDRALAZINE HCL 20 MG/ML IJ SOLN: 5.0000 mg | INTRAMUSCULAR | Status: DC | PRN

## 2015-06-21 MED ORDER — LEVOTHYROXINE SODIUM 50 MCG PO TABS
50.0000 ug | ORAL_TABLET | Freq: Every day | ORAL | Status: DC
  Administered 2015-06-21 – 2015-06-22 (×2): 50 ug via ORAL
  Filled 2015-06-21 (×4): qty 1

## 2015-06-21 MED ORDER — INSULIN DETEMIR 100 UNIT/ML ~~LOC~~ SOLN
20.0000 [IU] | Freq: Two times a day (BID) | SUBCUTANEOUS | Status: DC
  Administered 2015-06-21 – 2015-06-22 (×3): 20 [IU] via SUBCUTANEOUS
  Filled 2015-06-21 (×6): qty 0.2

## 2015-06-21 MED ORDER — ATORVASTATIN CALCIUM 40 MG PO TABS
40.0000 mg | ORAL_TABLET | Freq: Every day | ORAL | Status: DC
  Administered 2015-06-21 – 2015-06-22 (×2): 40 mg via ORAL
  Filled 2015-06-21 (×2): qty 1

## 2015-06-21 MED ORDER — IRBESARTAN 150 MG PO TABS
150.0000 mg | ORAL_TABLET | Freq: Every day | ORAL | Status: DC
  Administered 2015-06-21 – 2015-06-22 (×2): 150 mg via ORAL
  Filled 2015-06-21 (×3): qty 1

## 2015-06-21 MED ORDER — VITAMIN D 1000 UNITS PO TABS
2000.0000 [IU] | ORAL_TABLET | Freq: Every day | ORAL | Status: DC
  Administered 2015-06-21 – 2015-06-22 (×2): 2000 [IU] via ORAL
  Filled 2015-06-21 (×2): qty 2

## 2015-06-21 MED ORDER — PANTOPRAZOLE SODIUM 40 MG PO TBEC
40.0000 mg | DELAYED_RELEASE_TABLET | Freq: Two times a day (BID) | ORAL | Status: DC
  Administered 2015-06-21 – 2015-06-22 (×3): 40 mg via ORAL
  Filled 2015-06-21 (×3): qty 1

## 2015-06-21 MED ORDER — MECLIZINE HCL 12.5 MG PO TABS
25.0000 mg | ORAL_TABLET | Freq: Three times a day (TID) | ORAL | Status: DC
Start: 2015-06-21 — End: 2015-06-22
  Administered 2015-06-21 – 2015-06-22 (×4): 25 mg via ORAL
  Filled 2015-06-21 (×4): qty 2

## 2015-06-21 MED ORDER — ONDANSETRON 4 MG PO TBDP
4.0000 mg | ORAL_TABLET | Freq: Three times a day (TID) | ORAL | Status: DC | PRN
  Administered 2015-06-21: 4 mg via ORAL
  Filled 2015-06-21: qty 1

## 2015-06-21 MED ORDER — INSULIN ASPART 100 UNIT/ML ~~LOC~~ SOLN
0.0000 [IU] | Freq: Three times a day (TID) | SUBCUTANEOUS | Status: DC
  Administered 2015-06-21 – 2015-06-22 (×3): 3 [IU] via SUBCUTANEOUS
  Administered 2015-06-22: 2 [IU] via SUBCUTANEOUS

## 2015-06-21 MED ORDER — APIXABAN 5 MG PO TABS
5.0000 mg | ORAL_TABLET | Freq: Two times a day (BID) | ORAL | Status: DC
  Administered 2015-06-21 – 2015-06-22 (×3): 5 mg via ORAL
  Filled 2015-06-21 (×5): qty 1

## 2015-06-21 MED ORDER — CARVEDILOL 6.25 MG PO TABS
6.2500 mg | ORAL_TABLET | Freq: Two times a day (BID) | ORAL | Status: DC
  Administered 2015-06-21 – 2015-06-22 (×3): 6.25 mg via ORAL
  Filled 2015-06-21 (×3): qty 1

## 2015-06-21 MED ORDER — ACETAMINOPHEN 325 MG PO TABS
650.0000 mg | ORAL_TABLET | Freq: Four times a day (QID) | ORAL | Status: DC | PRN
  Administered 2015-06-21: 650 mg via ORAL
  Filled 2015-06-21: qty 2

## 2015-06-21 MED ORDER — MECLIZINE HCL 12.5 MG PO TABS: 25.0000 mg | ORAL_TABLET | Freq: Three times a day (TID) | ORAL | Status: DC | PRN

## 2015-06-21 MED ORDER — MECLIZINE HCL 25 MG PO TABS: 25.0000 mg | ORAL_TABLET | Freq: Three times a day (TID) | ORAL | Status: DC | PRN

## 2015-06-21 MED ORDER — SODIUM CHLORIDE 0.9 % IV SOLN
INTRAVENOUS | Status: DC
  Administered 2015-06-21: 07:00:00 via INTRAVENOUS

## 2015-06-21 MED ORDER — HYDRALAZINE HCL 20 MG/ML IJ SOLN
10.0000 mg | Freq: Four times a day (QID) | INTRAMUSCULAR | Status: DC | PRN
  Administered 2015-06-21: 10 mg via INTRAVENOUS
  Filled 2015-06-21: qty 1

## 2015-06-21 MED ORDER — PREGABALIN 75 MG PO CAPS
150.0000 mg | ORAL_CAPSULE | Freq: Every day | ORAL | Status: DC
  Administered 2015-06-21 – 2015-06-22 (×2): 150 mg via ORAL
  Filled 2015-06-21 (×2): qty 2

## 2015-06-21 MED ORDER — ATORVASTATIN CALCIUM 40 MG PO TABS: 40.0000 mg | ORAL_TABLET | ORAL | Status: DC

## 2015-06-21 MED ORDER — IPRATROPIUM-ALBUTEROL 0.5-2.5 (3) MG/3ML IN SOLN: 3.0000 mL | Freq: Four times a day (QID) | RESPIRATORY_TRACT | Status: DC | PRN

## 2015-06-21 MED ORDER — DULOXETINE HCL 60 MG PO CPEP
60.0000 mg | ORAL_CAPSULE | Freq: Every day | ORAL | Status: DC
  Administered 2015-06-21 – 2015-06-22 (×2): 60 mg via ORAL
  Filled 2015-06-21 (×3): qty 1

## 2015-06-21 MED ORDER — BUDESONIDE 0.25 MG/2ML IN SUSP
0.2500 mg | Freq: Two times a day (BID) | RESPIRATORY_TRACT | Status: DC
  Administered 2015-06-21 – 2015-06-22 (×3): 0.25 mg via RESPIRATORY_TRACT
  Filled 2015-06-21 (×6): qty 2

## 2015-06-21 MED ORDER — ACETAMINOPHEN 650 MG RE SUPP: 650.0000 mg | Freq: Four times a day (QID) | RECTAL | Status: DC | PRN

## 2015-06-21 MED ORDER — SODIUM CHLORIDE 0.9% FLUSH
3.0000 mL | Freq: Two times a day (BID) | INTRAVENOUS | Status: DC
  Administered 2015-06-21: 3 mL via INTRAVENOUS

## 2015-06-21 NOTE — Progress Notes (Addendum)
Received from ER via bed; patient is alert and oriented; no acute distress; oriented to room  And unit routine; spouse at bedside. [facility] admissions contacted to assign MD.

## 2015-06-21 NOTE — Progress Notes (Addendum)
Patient seen and examined  81 y.o. female with PMH of vertigo, stroke, atrial fibrillation on Eliquis, hypertension, hyperlipidemia, diabetes mellitus, COPD, asthma, GERD, hypothyroidism, depression, carotid artery endarterectomy, who presents with vertigo.She had left MCA small stroke in 03/2015 with mild residual right upper extremity weakness. Today she started noticing severe dizziness and gait instability, which is new from her baseline.  Workup includes MRI of the brain  did not show any acute stroke. 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 These are chronic. Neurologic examination showed mild residual right upper extremity weakness from a prior stroke from 03/2015. No abnormal cerebellar signs noted.   Plan Vertigo:  Likely BPV, Dr. Lavon Paganini saw pt, and  recommended physical therapy and no further neurodiagnostic testing recommended at this time. ,tele bed, continue meclizine -get vestibular physical therapy and OT -Fall precaution    Atrial Fibrillation: CHA2DS2-VASc Score is 8-9, needs oral anticoagulation. Patient is on Eliquis at home. INR is 1.55 on admission. Heart rate is well controlled. -continue Coreg and Eliquis  Hx of stroke: -Continue Eliquis and Lipitor  DM-II: Last A1c 8.9 on 11/06/11/14, poorly controled. Patient is taking Levemir and metformin at home Cont levemir  20 units twice a day   Hypothyroidism: Last TSH was 1.78 on 03/25/15 -Continue home Synthroid  HLD: Last LDL was 76 on 04/01/15 -Continue home medications: Lipitor  GERD: -Protonix  HTN: -On Coreg and irbesartan -IV hydralazine when necessary  Depression: Stable, no suicidal or homicidal ideations. -Continue home medications: Cymbalta  anticipate dc in am

## 2015-06-21 NOTE — Consult Note (Signed)
Requesting Physician: Dr.  Deretha Emory, in ER    Reason for consultation:  Vertigo and gait instability  HPI:                                                                                                                                         Casey Harrell is an 81 y.o. female patient who presented to the ER with acute onset of vertigo and gait instability. She had left MCA small stroke in 03/2015 with mild residual right upper extremity weakness. Today she started noticing severe dizziness and gait instability, which is new from her baseline. Hence presented to the ER for further evaluation   Past Medical History: Past Medical History  Diagnosis Date  . DIABETES-TYPE 2 07/15/2008  . DIABETIC PERIPHERAL NEUROPATHY 09/02/2009  . HYPERTENSION 07/15/2008  . ASTHMA 07/15/2008  . GERD 07/15/2008  . PVD (peripheral vascular disease) (HCC) 06/18/09    RCE  . Normal cardiac stress test     low risk Nuc 2009, Feb 2011  . COPD (chronic obstructive pulmonary disease) (HCC)   . Vertigo   . Stroke (HCC)   . Paroxysmal atrial fibrillation [facility])     Past Surgical History  Procedure Laterality Date  . Appendectomy  1971  . Abdominal hysterectomy  1971  . Tonsillectomy    . Tubal ligation    . Cesarean section    . Flexible sigmoidoscopy      diverticulitis  . Laparotomy      X 3 with adhesions lysis  . Foot surgery      right foot X 2  . Carotid endarterectomy  06/18/09    RCE  . Colectomy and colostomy      Family History: Family History  Problem Relation Age of Onset  . Diabetes Mother   . Arthritis Mother   . Heart disease Mother   . Heart disease Father   . Celiac disease Sister   . Stroke Maternal Grandfather   . Cancer Paternal Grandfather     lung  . Heart disease Sister   . Heart disease Sister   . Asthma Sister   . Heart disease Brother   . Cancer Brother 58    lung cancer    Social History:   reports that she has never smoked. She has never used smokeless tobacco. She  reports that she does not drink alcohol. Her drug history is not on file.  Allergies:  Allergies  Allergen Reactions  . Doxycycline Hyclate Nausea And Vomiting  . Morphine Sulfate Other (See Comments)    REACTION: hallucinations  . Iohexol Hives     Code: HIVES, Desc: pt. states she breaks out in hives 06/15/08   . Moxifloxacin Other (See Comments)    REACTION: unknown     Medications:  No current facility-administered medications for this encounter.  Current outpatient prescriptions:  .  albuterol (ACCUNEB) 1.25 MG/3ML nebulizer solution, Take 3 mLs (1.25 mg total) by nebulization every 4 (four) hours as needed for wheezing. Reported on 04/26/2015, Disp: 75 mL, Rfl: 12 .  apixaban (ELIQUIS) 5 MG TABS tablet, Take 1 tablet (5 mg total) by mouth 2 (two) times daily., Disp: 60 tablet, Rfl: 3 .  atorvastatin (LIPITOR) 40 MG tablet, Take 1 tablet (40 mg total) by mouth 1 day or 1 dose., Disp: 90 tablet, Rfl: 2 .  budesonide (PULMICORT) 0.25 MG/2ML nebulizer solution, Take 2 mLs (0.25 mg total) by nebulization 2 (two) times daily., Disp: 60 mL, Rfl: 1 .  Calcium Citrate (CITRACAL PO), Take 1 tablet by mouth 2 (two) times daily. Reported on 04/26/2015, Disp: , Rfl:  .  carvedilol (COREG) 6.25 MG tablet, Take 1 tablet (6.25 mg total) by mouth 2 (two) times daily with a meal., Disp: 180 tablet, Rfl: 2 .  Cholecalciferol (VITAMIN D3) 2000 UNITS TABS, Take 2,000 Units by mouth daily. Reported on 04/26/2015, Disp: , Rfl:  .  DULoxetine (CYMBALTA) 60 MG capsule, TAKE 1 CAPSULE BY MOUTH DAILY, Disp: 90 capsule, Rfl: 1 .  HUMALOG KWIKPEN 100 UNIT/ML KiwkPen, INJECT 9-14 UNITS SUB-Q TWICE A DAY WITH A MEAL. 9U BEFORE BREAKFAST AND 14 UNITS AFTER SUPPER, Disp: , Rfl: 5 .  Insulin Detemir (LEVEMIR FLEXTOUCH) 100 UNIT/ML Pen, Inject 40 Units into the skin 2 (two) times daily. (Patient  taking differently: Inject 30 Units into the skin 2 (two) times daily. ), Disp: 15 mL, Rfl: 5 .  ipratropium-albuterol (DUONEB) 0.5-2.5 (3) MG/3ML SOLN, Take 3 mLs by nebulization every 6 (six) hours as needed (shortness of breath or wheezing)., Disp: 360 mL, Rfl: 0 .  levothyroxine (SYNTHROID, LEVOTHROID) 50 MCG tablet, Take 1 tablet (50 mcg total) by mouth daily before breakfast., Disp: 90 tablet, Rfl: 0 .  LYRICA 150 MG capsule, TAKE ONE CAPSULE BY MOUTH EVERY DAY, Disp: 90 capsule, Rfl: 1 .  meclizine (ANTIVERT) 25 MG tablet, Take 25 mg by mouth 3 (three) times daily as needed for dizziness., Disp: , Rfl:  .  metFORMIN (GLUCOPHAGE) 1000 MG tablet, Take 1 tablet (1,000 mg total) by mouth 2 (two) times daily with a meal., Disp: 180 tablet, Rfl: 2 .  ondansetron (ZOFRAN ODT) 4 MG disintegrating tablet, Take 1 tablet (4 mg total) by mouth every 8 (eight) hours as needed for nausea or vomiting., Disp: 10 tablet, Rfl: 0 .  pantoprazole (PROTONIX) 40 MG tablet, TAKE 1 TABLET BY MOUTH TWICE DAILY, Disp: 180 tablet, Rfl: 1 .  valsartan (DIOVAN) 160 MG tablet, Take 160 mg by mouth daily., Disp: , Rfl: 3 .  meclizine (ANTIVERT) 25 MG tablet, Take 1 tablet (25 mg total) by mouth 3 (three) times daily as needed for dizziness., Disp: 30 tablet, Rfl: 0   ROS:  History obtained from the patient  General ROS: negative for - chills, fatigue, fever, night sweats, weight gain or weight loss Psychological ROS: negative for - behavioral disorder, hallucinations, memory difficulties, mood swings or suicidal ideation Ophthalmic ROS: negative for - blurry vision, double vision, eye pain or loss of vision ENT ROS: negative for - epistaxis, nasal discharge, oral lesions, sore throat, tinnitus or vertigo Allergy and Immunology ROS: negative for - hives or itchy/watery eyes Hematological  and Lymphatic ROS: negative for - bleeding problems, bruising or swollen lymph nodes Endocrine ROS: negative for - galactorrhea, hair pattern changes, polydipsia/polyuria or temperature intolerance Respiratory ROS: negative for - cough, hemoptysis, shortness of breath or wheezing Cardiovascular ROS: negative for - chest pain, dyspnea on exertion, edema or irregular heartbeat Gastrointestinal ROS: negative for - abdominal pain, diarrhea, hematemesis, nausea/vomiting or stool incontinence Genito-Urinary ROS: negative for - dysuria, hematuria, incontinence or urinary frequency/urgency Musculoskeletal ROS: negative for - joint swelling or muscular weakness Neurological ROS: as noted in HPI Dermatological ROS: negative for rash and skin lesion changes  Neurologic Examination:                                                                                                      Blood pressure 173/67, pulse 80, temperature 98.1 F (36.7 C), temperature source Oral, resp. rate 22, SpO2 96 %.  Evaluation of higher integrative functions including: Level of alertness: Alert,  Oriented to time, place and person Speech: fluent, no evidence of dysarthria or aphasia noted.  Test the following cranial nerves: 2-12 grossly intact Motor examination: Normal tone, mild  4/5 weakness of the right deltoid, minimal weakness in the right biceps triceps and wrist flexion and extension noted. full 5/5 motor strength in left upper extremity and in bilateral lower extremities  Examination of sensation : Normal and symmetric sensation to pinprick in all 4 extremities and on face Examination of deep tendon reflexes: 2+, normal and symmetric in all extremities, normal plantars bilaterally Test coordination: Normal finger nose testing, with the left upper extremity, mild difficulty with the right upper extremity due to weakness, no abnormal involuntary movements or tremors noted.  Gait: Deferred. She had truncal ataxia when  sitting up on the bed.    Lab Results: Basic Metabolic Panel:  Recent Labs Lab 06/20/15 2008 06/20/15 2026  NA 144 144  K 4.2 3.8  CL 106 104  CO2 26  --   GLUCOSE 82 76  BUN 14 16  CREATININE 0.69 0.80  CALCIUM 9.9  --     Liver Function Tests:  Recent Labs Lab 06/20/15 2008  AST 19  ALT 13*  ALKPHOS 83  BILITOT 0.3  PROT 7.2  ALBUMIN 3.6   No results for input(s): LIPASE, AMYLASE in the last 168 hours. No results for input(s): AMMONIA in the last 168 hours.  CBC:  Recent Labs Lab 06/20/15 2008 06/20/15 2026  WBC 11.3*  --   NEUTROABS 5.0  --   HGB 12.2 13.9  HCT 38.2 41.0  MCV 81.6  --   PLT 286  --  Cardiac Enzymes: No results for input(s): CKTOTAL, CKMB, CKMBINDEX, TROPONINI in the last 168 hours.  Lipid Panel: No results for input(s): CHOL, TRIG, HDL, CHOLHDL, VLDL, LDLCALC in the last 168 hours.  CBG:  Recent Labs Lab 06/20/15 2342  GLUCAP 114*    Microbiology: Results for orders placed or performed during the hospital encounter of 05/07/15  Urine culture     Status: None   Collection Time: 05/07/15  3:02 PM  Result Value Ref Range Status   Specimen Description URINE, RANDOM  Final   Special Requests NONE  Final   Culture   Final    >=100,000 COLONIES/mL CITROBACTER KOSERI 50,000 COLONIES/mL KLEBSIELLA PNEUMONIAE    Report Status 05/09/2015 FINAL  Final   Organism ID, Bacteria CITROBACTER KOSERI  Final   Organism ID, Bacteria KLEBSIELLA PNEUMONIAE  Final      Susceptibility   Citrobacter koseri - MIC*    CEFAZOLIN <=4 SENSITIVE Sensitive     CEFTRIAXONE <=1 SENSITIVE Sensitive     CIPROFLOXACIN <=0.25 SENSITIVE Sensitive     GENTAMICIN <=1 SENSITIVE Sensitive     IMIPENEM <=0.25 SENSITIVE Sensitive     NITROFURANTOIN 32 SENSITIVE Sensitive     TRIMETH/SULFA <=20 SENSITIVE Sensitive     PIP/TAZO <=4 SENSITIVE Sensitive     * >=100,000 COLONIES/mL CITROBACTER KOSERI   Klebsiella pneumoniae - MIC*    AMPICILLIN 16  RESISTANT Resistant     CEFAZOLIN <=4 SENSITIVE Sensitive     CEFTRIAXONE <=1 SENSITIVE Sensitive     CIPROFLOXACIN <=0.25 SENSITIVE Sensitive     GENTAMICIN <=1 SENSITIVE Sensitive     IMIPENEM <=0.25 SENSITIVE Sensitive     NITROFURANTOIN 64 INTERMEDIATE Intermediate     TRIMETH/SULFA <=20 SENSITIVE Sensitive     AMPICILLIN/SULBACTAM 4 SENSITIVE Sensitive     PIP/TAZO <=4 SENSITIVE Sensitive     * 50,000 COLONIES/mL KLEBSIELLA PNEUMONIAE     Imaging: Dg Chest 2 View  06/20/2015  CLINICAL DATA:  81 year old female with weakness and vertigo. EXAM: CHEST  2 VIEW COMPARISON:  Radiograph dated 04/19/2015 FINDINGS: Two views of the chest demonstrate minimal bibasilar atelectatic changes. There is no focal consolidation, pleural effusion, or pneumothorax. Stable cardiac silhouette. The osseous structures are grossly unremarkable. IMPRESSION: No active cardiopulmonary disease. Electronically Signed   By: Elgie Collard M.D.   On: 06/20/2015 20:13   Ct Head Wo Contrast  06/20/2015  CLINICAL DATA:  81 year old with personal history of stroke, current history of diabetes and hypertension, presenting with acute onset of vertigo which began 2 days ago. EXAM: CT HEAD WITHOUT CONTRAST TECHNIQUE: Contiguous axial images were obtained from the base of the skull through the vertex without intravenous contrast. COMPARISON:  CT head and MRI brain 04/02/2015 and earlier. FINDINGS: Head tilt in the gantry accounts for apparent asymmetry in the cerebral hemispheres. Old left posterior parietal cortical stroke with encephalomalacia, unchanged. Old subcortical stroke in the right occipital white matter, unchanged. Old lacunar stroke in the deep white matter of the right frontotemporal region adjacent to the atrium of the lateral ventricle, unchanged. Severe changes of small vessel disease of the white matter diffusely, unchanged. Moderate cortical and deep atrophy, unchanged. No mass lesion. No midline shift. No acute  hemorrhage or hematoma. No extra-axial fluid collections. No evidence of acute infarction. No skull fracture or other focal osseous abnormality involving the skull. Minimal fluid in the inferior right mastoid air cells, unchanged. Bilateral mastoid air cells and bilateral middle ear cavities well aerated otherwise. Mucosal thickening involving the maxillary  sinuses, opacification of right posterior ethmoid air cells, and minimal mucosal thickening in the sphenoid sinuses, unchanged. IMPRESSION: 1. No acute intracranial abnormality. 2. Multiple remote bilateral strokes, unchanged. 3. Stable moderate generalized atrophy and severe chronic microvascular ischemic changes of the white matter. 4. Minimal right mastoid effusion, unchanged. No evidence of otitis media. 5. Mild chronic pansinusitis, unchanged. Electronically Signed   By: Hulan Saas M.D.   On: 06/20/2015 20:12   Mr Maxine Glenn Head Wo Contrast  06/21/2015  CLINICAL DATA:  Near syncopal episode 3 hours ago. Vertigo for 2 days, not improving with meclizine. History of heart block. EXAM: MR HEAD WITHOUT CONTRAST MR CIRCLE OF WILLIS WITHOUT CONTRAST MRA OF THE NECK WITHOUT AND WITH CONTRAST TECHNIQUE: Multiplanar, multiecho pulse sequences of the brain and surrounding structures were obtained according to standard protocol without intravenous contrast.; Angiographic images of the Circle of Willis were obtained using MRA technique without intravenous contrast.; Multiplanar and multiecho pulse sequences of the neck were obtained without and with intravenous contrast. Angiographic images of the neck were obtained using MRA technique without and with intravenous contrast. CONTRAST:  14mL MULTIHANCE GADOBENATE DIMEGLUMINE 529 MG/ML IV SOLN COMPARISON:  CT head June 20, 2015 at 1923 hours and MRI of the brain April 02, 2015 and MRA head April 01, 2015 FINDINGS: MR HEAD FINDINGS No reduced diffusion to suggest acute ischemia. Old micro hemorrhage LEFT  pontomedullary junction. Ventricles and sulci are normal for patient's age. Patchy to confluent supratentorial white matter FLAIR T2 hyperintensities, to lesser extent within the pons. Bilateral occipital lobe encephalomalacia. Tiny bilateral basal ganglia and thalamus lacunar infarcts versus perivascular spaces. No midline shift, mass effect or mass lesions. No abnormal extra-axial fluid collections. Status post bilateral ocular lens implants. Mild paranasal sinus mucosal thickening without air-fluid levels. Trace RIGHT mastoid effusion. No abnormal sellar expansion. No cerebellar tonsillar ectopia. No suspicious calvarial bone marrow signal. MR CIRCLE OF WILLIS FINDINGS Mildly motion degraded examination. Anterior circulation: Normal flow related enhancement of the included cervical, petrous, cavernous and supraclinoid internal carotid arteries. Patent anterior communicating artery. High-grade stenosis LEFT M1 segment. Slow flow LEFT mid to distal segments. Normal appearance the anterior and RIGHT middle cerebral arteries. No large vessel occlusion, aneurysm. Posterior circulation: RIGHT vertebral artery is dominant. Slow flow in intermittent loss of flow related enhancement LEFT V4 segment is similar. Basilar artery is patent, with normal flow related enhancement of the main branch vessels. Flow related enhancement of the posterior cerebral arteries. Tandem moderate stenosis proximal RIGHT posterior cerebral artery. High-grade stenosis proximal LEFT P2 segment, vessel remains patent, subsequent moderate tandem stenosis. Robust RIGHT posterior communicating artery present. No aneurysm. MRA NECK FINDINGS Moderate motion degraded examination. Source images and MIP image were reviewed. The common carotid arteries are widely patent bilaterally. The carotid bifurcation is patent bilaterally and there is no significant carotid stenosis. Mild luminal irregularity compatible with atherosclerosis. High-grade stenosis LEFT  vertebral artery origin, mild stenosis RIGHT vertebral artery origin. High-grade stenosis LEFT V4 segment. IMPRESSION: MRI HEAD: No acute intracranial process, specifically no acute ischemia. Stable chronic changes including moderate to severe chronic small vessel ischemic disease. Bilateral occipital encephalomalacia compatible with old PCA territory infarct. MRA HEAD: Mild motion degraded examination. No emergent large vessel occlusion. Stable appearance, including high-grade stenosis LEFT M1 segment ; high-grade stenosis proximal LEFT P2 segment with moderate tandem stenosis bilateral posterior cerebral arteries. High-grade stenosis LEFT V4 segment. MRA NECK: Moderately motion degraded examination. High-grade stenosis LEFT vertebral artery. Patent carotid arteries. Electronically Signed  By: Awilda Metro M.D.   On: 06/21/2015 00:02   Mr Angiogram Neck W Wo Contrast  06/21/2015  CLINICAL DATA:  Near syncopal episode 3 hours ago. Vertigo for 2 days, not improving with meclizine. History of heart block. EXAM: MR HEAD WITHOUT CONTRAST MR CIRCLE OF WILLIS WITHOUT CONTRAST MRA OF THE NECK WITHOUT AND WITH CONTRAST TECHNIQUE: Multiplanar, multiecho pulse sequences of the brain and surrounding structures were obtained according to standard protocol without intravenous contrast.; Angiographic images of the Circle of Willis were obtained using MRA technique without intravenous contrast.; Multiplanar and multiecho pulse sequences of the neck were obtained without and with intravenous contrast. Angiographic images of the neck were obtained using MRA technique without and with intravenous contrast. CONTRAST:  14mL MULTIHANCE GADOBENATE DIMEGLUMINE 529 MG/ML IV SOLN COMPARISON:  CT head June 20, 2015 at 1923 hours and MRI of the brain April 02, 2015 and MRA head April 01, 2015 FINDINGS: MR HEAD FINDINGS No reduced diffusion to suggest acute ischemia. Old micro hemorrhage LEFT pontomedullary junction.  Ventricles and sulci are normal for patient's age. Patchy to confluent supratentorial white matter FLAIR T2 hyperintensities, to lesser extent within the pons. Bilateral occipital lobe encephalomalacia. Tiny bilateral basal ganglia and thalamus lacunar infarcts versus perivascular spaces. No midline shift, mass effect or mass lesions. No abnormal extra-axial fluid collections. Status post bilateral ocular lens implants. Mild paranasal sinus mucosal thickening without air-fluid levels. Trace RIGHT mastoid effusion. No abnormal sellar expansion. No cerebellar tonsillar ectopia. No suspicious calvarial bone marrow signal. MR CIRCLE OF WILLIS FINDINGS Mildly motion degraded examination. Anterior circulation: Normal flow related enhancement of the included cervical, petrous, cavernous and supraclinoid internal carotid arteries. Patent anterior communicating artery. High-grade stenosis LEFT M1 segment. Slow flow LEFT mid to distal segments. Normal appearance the anterior and RIGHT middle cerebral arteries. No large vessel occlusion, aneurysm. Posterior circulation: RIGHT vertebral artery is dominant. Slow flow in intermittent loss of flow related enhancement LEFT V4 segment is similar. Basilar artery is patent, with normal flow related enhancement of the main branch vessels. Flow related enhancement of the posterior cerebral arteries. Tandem moderate stenosis proximal RIGHT posterior cerebral artery. High-grade stenosis proximal LEFT P2 segment, vessel remains patent, subsequent moderate tandem stenosis. Robust RIGHT posterior communicating artery present. No aneurysm. MRA NECK FINDINGS Moderate motion degraded examination. Source images and MIP image were reviewed. The common carotid arteries are widely patent bilaterally. The carotid bifurcation is patent bilaterally and there is no significant carotid stenosis. Mild luminal irregularity compatible with atherosclerosis. High-grade stenosis LEFT vertebral artery origin,  mild stenosis RIGHT vertebral artery origin. High-grade stenosis LEFT V4 segment. IMPRESSION: MRI HEAD: No acute intracranial process, specifically no acute ischemia. Stable chronic changes including moderate to severe chronic small vessel ischemic disease. Bilateral occipital encephalomalacia compatible with old PCA territory infarct. MRA HEAD: Mild motion degraded examination. No emergent large vessel occlusion. Stable appearance, including high-grade stenosis LEFT M1 segment ; high-grade stenosis proximal LEFT P2 segment with moderate tandem stenosis bilateral posterior cerebral arteries. High-grade stenosis LEFT V4 segment. MRA NECK: Moderately motion degraded examination. High-grade stenosis LEFT vertebral artery. Patent carotid arteries. Electronically Signed   By: Awilda Metro M.D.   On: 06/21/2015 00:02   Mr Brain Wo Contrast  06/21/2015  CLINICAL DATA:  Near syncopal episode 3 hours ago. Vertigo for 2 days, not improving with meclizine. History of heart block. EXAM: MR HEAD WITHOUT CONTRAST MR CIRCLE OF WILLIS WITHOUT CONTRAST MRA OF THE NECK WITHOUT AND WITH CONTRAST TECHNIQUE: Multiplanar, multiecho pulse  sequences of the brain and surrounding structures were obtained according to standard protocol without intravenous contrast.; Angiographic images of the Circle of Willis were obtained using MRA technique without intravenous contrast.; Multiplanar and multiecho pulse sequences of the neck were obtained without and with intravenous contrast. Angiographic images of the neck were obtained using MRA technique without and with intravenous contrast. CONTRAST:  14mL MULTIHANCE GADOBENATE DIMEGLUMINE 529 MG/ML IV SOLN COMPARISON:  CT head June 20, 2015 at 1923 hours and MRI of the brain April 02, 2015 and MRA head April 01, 2015 FINDINGS: MR HEAD FINDINGS No reduced diffusion to suggest acute ischemia. Old micro hemorrhage LEFT pontomedullary junction. Ventricles and sulci are normal for  patient's age. Patchy to confluent supratentorial white matter FLAIR T2 hyperintensities, to lesser extent within the pons. Bilateral occipital lobe encephalomalacia. Tiny bilateral basal ganglia and thalamus lacunar infarcts versus perivascular spaces. No midline shift, mass effect or mass lesions. No abnormal extra-axial fluid collections. Status post bilateral ocular lens implants. Mild paranasal sinus mucosal thickening without air-fluid levels. Trace RIGHT mastoid effusion. No abnormal sellar expansion. No cerebellar tonsillar ectopia. No suspicious calvarial bone marrow signal. MR CIRCLE OF WILLIS FINDINGS Mildly motion degraded examination. Anterior circulation: Normal flow related enhancement of the included cervical, petrous, cavernous and supraclinoid internal carotid arteries. Patent anterior communicating artery. High-grade stenosis LEFT M1 segment. Slow flow LEFT mid to distal segments. Normal appearance the anterior and RIGHT middle cerebral arteries. No large vessel occlusion, aneurysm. Posterior circulation: RIGHT vertebral artery is dominant. Slow flow in intermittent loss of flow related enhancement LEFT V4 segment is similar. Basilar artery is patent, with normal flow related enhancement of the main branch vessels. Flow related enhancement of the posterior cerebral arteries. Tandem moderate stenosis proximal RIGHT posterior cerebral artery. High-grade stenosis proximal LEFT P2 segment, vessel remains patent, subsequent moderate tandem stenosis. Robust RIGHT posterior communicating artery present. No aneurysm. MRA NECK FINDINGS Moderate motion degraded examination. Source images and MIP image were reviewed. The common carotid arteries are widely patent bilaterally. The carotid bifurcation is patent bilaterally and there is no significant carotid stenosis. Mild luminal irregularity compatible with atherosclerosis. High-grade stenosis LEFT vertebral artery origin, mild stenosis RIGHT vertebral artery  origin. High-grade stenosis LEFT V4 segment. IMPRESSION: MRI HEAD: No acute intracranial process, specifically no acute ischemia. Stable chronic changes including moderate to severe chronic small vessel ischemic disease. Bilateral occipital encephalomalacia compatible with old PCA territory infarct. MRA HEAD: Mild motion degraded examination. No emergent large vessel occlusion. Stable appearance, including high-grade stenosis LEFT M1 segment ; high-grade stenosis proximal LEFT P2 segment with moderate tandem stenosis bilateral posterior cerebral arteries. High-grade stenosis LEFT V4 segment. MRA NECK: Moderately motion degraded examination. High-grade stenosis LEFT vertebral artery. Patent carotid arteries. Electronically Signed   By: Awilda Metro M.D.   On: 06/21/2015 00:02    Assessment and plan:   Casey Harrell is an 81 y.o. female patient who presented to the ER due to acute onset of severe vertigo and gait instability. She continues to have severe vertigo and truncal ataxia when sitting up and unable to stand up or walk. MRI of the brain done in the ER did not show any acute stroke. 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 These are chronic. Neurologic examination showed mild residual right upper extremity weakness from a prior stroke from 03/2015. No abnormal cerebellar signs noted.  Patient will be admitted to  hospitalist service for further monitoring and resolution of her vertigo and gait instability symptoms. Physical therapy tomorrow morning prior to discharge.  No further neurodiagnostic testing recommended at this time. Prevent hypotension. Continue her home medications including eliquis.  Neurology service will continue to follow up. Please call for any further questions.

## 2015-06-21 NOTE — Care Management Note (Signed)
Case Management Note  Patient Details  Name: Casey Harrell MRN: [id number] Date of Birth: 1934-06-08  Subjective/Objective:                    Action/Plan: Patient presented with vertigo. Lives at home with spouse. Will follow for discharge needs pending PT/OT evals and physician orders.  Expected Discharge Date:                  Expected Discharge Plan:     In-House Referral:     Discharge planning Services     Post Acute Care Choice:    Choice offered to:     DME Arranged:    DME Agency:     HH Arranged:    HH Agency:     Status of Service:  In process, will continue to follow  Medicare Important Message Given:    Date Medicare IM Given:    Medicare IM give by:    Date Additional Medicare IM Given:    Additional Medicare Important Message give by:     If discussed at Long Length of Stay Meetings, dates discussed:    Additional CommentsAnda Kraft, RN 06/21/2015, 3:22 PM [phone]

## 2015-06-21 NOTE — Evaluation (Addendum)
Physical Therapy Evaluation/Vestibular Assessment Patient Details Name: Casey Harrell MRN: [id number] DOB: Oct 05, 1934 Today's Date: 06/21/2015   History of Present Illness  81 y.o. female admitted to [facility] on 06/20/15 for acute onset of dizziness.  MRI negative for actue event.  MRA showed stenosis of left vertebral artery and L proximal MCA.  Pt with significant PMHx of DM, peripherial neuropathy, PVD, vertigo, stroke, PAF, R foot surgery x2, and RCEA.  Clinical Impression  Pt blacked out briefly while working with PT EOB.  Orthostatics initiated, but only got supine and sitting BPs (see vitals below).  Pt nauseated and spinning when seated EOB.  Once fully upright, BP initiated and then pt blacked out for <5 seconds before coming back around and asking for her husband.  We were not doing anything in particular EOB, other than trying to get pt all the way to upright sitting with feet on the floor.  PT had not started seated vestibular testing yet (some already preformed in supine).  Pt positioned back in supine and RN notified (who then notified MD).  Pt is having vertigo symptoms with movement that dissipate slowly with time, but no true nystagmus with these changes in position (right side lying and going from sitting to supine were the most symptomatic so far).  Right beating nystagmus with smooth pursuits indicating a left sided issue.  The left vertebral artery is "severely stenosed" which could cause blacking out and vertigo symptoms.   PT to follow acutely for deficits listed below and continued vestibular assessment, mobility progression as pt is able.      Follow Up Recommendations SNF    Equipment Recommendations  None recommended by PT    Recommendations for Other Services   NA    Precautions / Restrictions Precautions Precautions: Fall Precaution Comments: pt blacked out with PT on eval      Mobility  Bed Mobility Overal bed mobility: Needs Assistance Bed Mobility: Sidelying to  Sit;Rolling;Sit to Sidelying Rolling: Modified independent (Device/Increase time) Sidelying to sit: Mod assist     Sit to sidelying: Max assist General bed mobility comments: Pt is able to roll bil with assist of the railing. She gets vertigo symptoms on the right side significantly more than her left side and she gets vertigo symptoms on both sides when going back to her back.    Transfers                 General transfer comment: Pt blacked out briefly in sitting, so standing not attempted at this time.         Balance Overall balance assessment: Needs assistance Sitting-balance support: Feet supported;Bilateral upper extremity supported Sitting balance-Leahy Scale: Poor Sitting balance - Comments: Pt needed min assist EOB.  Some mild postural sway when initially sitting.  Had to move slowly due to vertigo symptoms.  In sitting, pt because nauseated, but did not vomit (only dry heaves).                                       Pertinent Vitals/Pain Pain Assessment: No/denies pain    06/21/15 1129  Vital Signs  Pulse Rate 77  Patient Position (if appropriate) Orthostatic Vitals  Orthostatic Lying   BP- Lying 176/84 mmHg  Pulse- Lying 77  Orthostatic Sitting  BP- Sitting (!) 166/106 mmHg  Pulse- Sitting 76  Oxygen Therapy  SpO2 93 %  O2 Device Room  Air    Home Living Family/patient expects to be discharged to:: Private residence Living Arrangements: Spouse/significant other Available Help at Discharge: Family;Available 24 hours/day (22 y.o. granddaughter stays with her during the day ) Type of Home: House Home Access: Stairs to enter Entrance Stairs-Rails: Doctor, general practice of Steps: 4 Home Layout: One level Home Equipment: Cane - single point;Walker - 2 wheels;Shower seat - built in;Bedside commode;Walker - 4 wheels;Wheelchair - manual Additional Comments: Pt with h/o vertigo PTA, but only when going to supine in the bed (she  sleeps on her right side).  Pt reports MD "who treats vertigo exclusively" told her to close her eyes and open her mouth wide to make the symptoms go away.      Prior Function Level of Independence: Independent with assistive device(s)         Comments: uses a cane most of the time, Pt reports she has to use RW for the past few days at home     Hand Dominance   Dominant Hand: Right    Extremity/Trunk Assessment   Upper Extremity Assessment: Overall WFL for tasks assessed           Lower Extremity Assessment: Generalized weakness      Cervical / Trunk Assessment: Other exceptions  Communication   Communication: No difficulties  Cognition Arousal/Alertness: Awake/alert Behavior During Therapy: WFL for tasks assessed/performed Overall Cognitive Status: Within Functional Limits for tasks assessed                      General Comments General comments (skin integrity, edema, etc.): See vestibular assessment.        Vestibular Assessment     06/21/15 1159  Vestibular Assessment  General Observation Eye alignment slightly abnormal.  Right seems higher than left.  Pt reports cataract surgery, but no other h/of corrective eye surgery, even as a child.  No h/o strabismus.  Pt wears glasses to read, but not to see or drive.  Pt has fullness in her left ear, and some reported hearing loss, but no new.  Pt just recently was started on a new blood thinner, and has had a recent UTI, but no URI.  Pt reports she has chronic sinus drainage.    Symptom Behavior  Type of Dizziness Spinning  Frequency of Dizziness intermittent  Duration of Dizziness minutes  Aggravating Factors Lying supine;Supine to sit  Relieving Factors Lying supine;Closing eyes (opening her mouth wide in supine)  Occulomotor Exam  Occulomotor Alignment Abnormal (right eye higher than left)  Spontaneous Absent  Gaze-induced Right beating nystagmus with R gaze  Smooth Pursuits Comment (good in all  planes, but R beating nystagmus with right gaze.)  Saccades (NT- did not get this far)  Auditory  Comments grossly intact, but deminished bil.  Positional Testing  Dix-Hallpike (pt with significant symptoms c sit->supine, no nystagmus)  Sidelying Test Sidelying Right;Sidelying Left  Horizontal Canal Testing Horizontal Canal Right;Horizontal Canal Left  Sidelying Right  Sidelying Right Duration >45 seconds, but dissipates with time  Sidelying Right Symptoms No nystagmus;Other (comment) (symptomatic spinning)  Sidelying Left  Sidelying Left Duration none  Sidelying Left Symptoms No nystagmus (no symptoms on the left until returning to supine )  Horizontal Canal Right  Horizontal Canal Right Duration 0  Horizontal Canal Right Symptoms Normal  Horizontal Canal Left  Horizontal Canal Left Duration 0  Horizontal Canal Left Symptoms Normal  Cognition  Cognition Orientation Level Oriented x 4  Positional Sensitivities  Sit  to Supine 4  Supine to Right Side 3  Supine to Sitting 4  Rolling Right 3  Rolling Left 0  Positional Sensitivities Comments Most significant areas of symptoms during limited testing was right side lying and going from sit to supine.       Assessment/Plan    PT Assessment Patient needs continued PT services  PT Diagnosis Difficulty walking;Abnormality of gait;Generalized weakness   PT Problem List Decreased strength;Decreased activity tolerance;Decreased balance;Decreased mobility;Decreased knowledge of use of DME;Cardiopulmonary status limiting activity  PT Treatment Interventions Gait training;DME instruction;Stair training;Functional mobility training;Therapeutic activities;Therapeutic exercise;Balance training;Neuromuscular re-education;Patient/family education   PT Goals (Current goals can be found in the Care Plan section) Acute Rehab PT Goals Patient Stated Goal: to get better and go home PT Goal Formulation: With patient Time For Goal Achievement:  07/05/15 Potential to Achieve Goals: Good    Frequency Min 4X/week           End of Session   Activity Tolerance: Other (comment) (limited by dizziness, lightheadedness, nausea. ) Patient left: in bed;with call bell/phone within reach;with family/visitor present Nurse Communication: Mobility status         Time: 1052-1140 PT Time Calculation (min) (ACUTE ONLY): 48 min   Charges:   PT Evaluation $PT Eval Moderate Complexity: 1 Procedure PT Treatments $Therapeutic Activity: 23-37 mins        Ah Bott B. Shamel Germond, PT, DPT [phone]   06/21/2015, 11:51 AM

## 2015-06-21 NOTE — H&P (Signed)
[facility] History and Physical  Casey Harrell ZOX:[id number] DOB: May 09, 1934 DOA: 06/20/2015  Referring physician: ED physician PCP: [name], MD  Specialists:   Chief Complaint: Vertigo  HPI: Casey Harrell is a 81 y.o. female with PMH of vertigo, stroke, atrial fibrillation on Eliquis, hypertension, hyperlipidemia, diabetes mellitus, COPD, asthma, GERD, hypothyroidism, depression, carotid artery endarterectomy, who presents with vertigo.  She had left MCA small stroke in 03/2015 with mild residual right upper extremity weakness. Today she started having severe dizziness and gait instability, which is new from her baseline. She does not have a new unilateral weakness, leading to neural hearing loss. No urinating. No headache. Patient does not have chest pain, abdominal pain, shortness breath, cough, diarrhea, symptoms of UTI.  In ED, patient was found to have INR 1.55, PTT 35, negative troponin, WBC 11.3, no tachycardia, electrolytes and renal function okay, negative chest x-ray for acute abnormalities, negative CT head for acute intracranial abnormalities. Patient had an MRI of brain which showed no any acute stroke. 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 Patient is admitted to inpatient for further evaluation and treatment. Neurology was consulted.  EKG: Independently reviewed. QTC 424, T-wave flattening.  Where does patient live?   At home  Can patient participate in ADLs?  Little   Review of Systems:   General: no fevers, chills, no changes in body weight,  has fatigue HEENT: no blurry vision, hearing changes or sore throat Pulm: no dyspnea, coughing, wheezing CV: no chest pain, no palpitations Abd: no nausea, vomiting, abdominal pain, diarrhea, constipation GU: no dysuria, burning on urination, increased urinary frequency, hematuria  Ext: no leg edema Neuro: has old R  sided weakness. No vision change or hearing loss. Has dizziness. Skin: no rash MSK: No muscle spasm, no deformity, no limitation of range of movement in spin Heme: No easy bruising.  Travel history: No recent long distant travel.  Allergy:  Allergies  Allergen Reactions  . Doxycycline Hyclate Nausea And Vomiting  . Morphine Sulfate Other (See Comments)    REACTION: hallucinations  . Iohexol Hives     Code: HIVES, Desc: pt. states she breaks out in hives 06/15/08   . Moxifloxacin Other (See Comments)    REACTION: unknown    Past Medical History  Diagnosis Date  . DIABETES-TYPE 2 07/15/2008  . DIABETIC PERIPHERAL NEUROPATHY 09/02/2009  . HYPERTENSION 07/15/2008  . ASTHMA 07/15/2008  . GERD 07/15/2008  . PVD (peripheral vascular disease) (HCC) 06/18/09    RCE  . Normal cardiac stress test     low risk Nuc 2009, Feb 2011  . COPD (chronic obstructive pulmonary disease) (HCC)   . Vertigo   . Stroke (HCC)   . Paroxysmal atrial fibrillation [facility])     Past Surgical History  Procedure Laterality Date  . Appendectomy  1971  . Abdominal hysterectomy  1971  . Tonsillectomy    . Tubal ligation    . Cesarean section    . Flexible sigmoidoscopy      diverticulitis  . Laparotomy      X 3 with adhesions lysis  . Foot surgery      right foot X 2  . Carotid endarterectomy  06/18/09    RCE  . Colectomy and colostomy      Social History:  reports that she has never smoked. She has never used smokeless tobacco. She reports that she does  not drink alcohol. Her drug history is not on file.  Family History:  Family History  Problem Relation Age of Onset  . Diabetes Mother   . Arthritis Mother   . Heart disease Mother   . Heart disease Father   . Celiac disease Sister   . Stroke Maternal Grandfather   . Cancer Paternal Grandfather     lung  . Heart disease Sister   . Heart disease Sister   . Asthma Sister   . Heart disease Brother   . Cancer Brother 60    lung cancer     Prior  to Admission medications   Medication Sig Start Date End Date Taking? Authorizing Provider  albuterol (ACCUNEB) 1.25 MG/3ML nebulizer solution Take 3 mLs (1.25 mg total) by nebulization every 4 (four) hours as needed for wheezing. Reported on 04/26/2015 05/13/15  Yes Jeralyn Bennett, MD  apixaban (ELIQUIS) 5 MG TABS tablet Take 1 tablet (5 mg total) by mouth 2 (two) times daily. 06/11/15  Yes Runell Gess, MD  atorvastatin (LIPITOR) 40 MG tablet Take 1 tablet (40 mg total) by mouth 1 day or 1 dose. 05/26/15  Yes [name], MD  budesonide (PULMICORT) 0.25 MG/2ML nebulizer solution Take 2 mLs (0.25 mg total) by nebulization 2 (two) times daily. 05/13/15  Yes Jeralyn Bennett, MD  Calcium Citrate (CITRACAL PO) Take 1 tablet by mouth 2 (two) times daily. Reported on 04/26/2015   Yes Historical Provider, MD  carvedilol (COREG) 6.25 MG tablet Take 1 tablet (6.25 mg total) by mouth 2 (two) times daily with a meal. 05/26/15  Yes [name], MD  Cholecalciferol (VITAMIN D3) 2000 UNITS TABS Take 2,000 Units by mouth daily. Reported on 04/26/2015   Yes Historical Provider, MD  DULoxetine (CYMBALTA) 60 MG capsule TAKE 1 CAPSULE BY MOUTH DAILY 01/15/15  Yes [name], MD  HUMALOG KWIKPEN 100 UNIT/ML KiwkPen INJECT 9-14 UNITS SUB-Q TWICE A DAY WITH A MEAL. 9U BEFORE BREAKFAST AND 14 UNITS AFTER SUPPER 04/21/15  Yes Historical Provider, MD  Insulin Detemir (LEVEMIR FLEXTOUCH) 100 UNIT/ML Pen Inject 40 Units into the skin 2 (two) times daily. Patient taking differently: Inject 30 Units into the skin 2 (two) times daily.  09/10/14  Yes [name], MD  ipratropium-albuterol (DUONEB) 0.5-2.5 (3) MG/3ML SOLN Take 3 mLs by nebulization every 6 (six) hours as needed (shortness of breath or wheezing). 04/20/15  Yes Evlyn Kanner Love, PA-C  levothyroxine (SYNTHROID, LEVOTHROID) 50 MCG tablet Take 1 tablet (50 mcg total) by mouth daily before breakfast. 12/08/14  Yes [name], MD  LYRICA 150 MG capsule  TAKE ONE CAPSULE BY MOUTH EVERY DAY 04/28/15  Yes [name], MD  meclizine (ANTIVERT) 25 MG tablet Take 25 mg by mouth 3 (three) times daily as needed for dizziness.   Yes Historical Provider, MD  metFORMIN (GLUCOPHAGE) 1000 MG tablet Take 1 tablet (1,000 mg total) by mouth 2 (two) times daily with a meal. 05/26/15  Yes [name], MD  ondansetron (ZOFRAN ODT) 4 MG disintegrating tablet Take 1 tablet (4 mg total) by mouth every 8 (eight) hours as needed for nausea or vomiting. 04/09/14  Yes Roxy Horseman, PA-C  pantoprazole (PROTONIX) 40 MG tablet TAKE 1 TABLET BY MOUTH TWICE DAILY 05/21/15  Yes [name], MD  valsartan (DIOVAN) 160 MG tablet Take 160 mg by mouth daily. 02/03/15  Yes Historical Provider, MD  meclizine (ANTIVERT) 25 MG tablet Take 1 tablet (25 mg total) by  mouth 3 (three) times daily as needed for dizziness. 06/21/15   Vanetta Mulders, MD    Physical Exam: Filed Vitals:   06/21/15 0430 06/21/15 0445 06/21/15 0500 06/21/15 0530  BP: 159/75 158/64 158/63 158/77  Pulse: 84 80 81 80  Temp:      TempSrc:      Resp: SpO2: 93% 93% 93% 96%   General: Not in acute distress HEENT:       Eyes: PERRL, EOMI, no scleral icterus.       ENT: No discharge from the ears and nose, no pharynx injection, no tonsillar enlargement.        Neck: No JVD, no bruit, no mass felt. Heme: No neck lymph node enlargement. Cardiac: S1/S2, RRR, No murmurs, No gallops or rubs. Pulm: No rales, wheezing, rhonchi or rubs. Abd: Soft, nondistended, nontender, no rebound pain, no organomegaly, BS present. Ext: No pitting leg edema bilaterally. 2+DP/PT pulse bilaterally. Musculoskeletal: No joint deformities, No joint redness or warmth, no limitation of ROM in spin. Skin: No rashes.  Neuro: Alert, oriented X3, cranial nerves II-XII grossly intact, moves all extremities normally. Muscle strength 3/5 in right arm and leg, and 5/5 in the left extremities, sensation to light touch  intact.  Knee reflex 1+ bilaterally. Negative Babinski's sign. Normal finger to nose test. Psych: Patient is not psychotic, no suicidal or hemocidal ideation.  Labs on Admission:  Basic Metabolic Panel:  Recent Labs Lab 06/20/15 2008 06/20/15 2026 06/21/15 0507  NA 144 144 141  K 4.2 3.8 3.9  CL 106 104 102  CO2 26  --  24  GLUCOSE 82 76 158*  BUN CREATININE 0.69 0.80 0.79  CALCIUM 9.9  --  9.5   Liver Function Tests:  Recent Labs Lab 06/20/15 2008 06/21/15 0507  AST 19 18  ALT 13* 14  ALKPHOS 83 77  BILITOT 0.3 0.7  PROT 7.2 6.6  ALBUMIN 3.6 3.5   No results for input(s): LIPASE, AMYLASE in the last 168 hours. No results for input(s): AMMONIA in the last 168 hours. CBC:  Recent Labs Lab 06/20/15 2008 06/20/15 2026 06/21/15 0507  WBC 11.3*  --  11.4*  NEUTROABS 5.0  --   --   HGB 12.2 13.9 13.3  HCT 38.2 41.0 40.6  MCV 81.6  --  81.7  PLT 286  --  264   Cardiac Enzymes: No results for input(s): CKTOTAL, CKMB, CKMBINDEX, TROPONINI in the last 168 hours.  BNP (last 3 results)  Recent Labs  06/21/15 0507  BNP 34.2    ProBNP (last 3 results) No results for input(s): PROBNP in the last 8760 hours.  CBG:  Recent Labs Lab 06/20/15 2342  GLUCAP 114*    Radiological Exams on Admission: Dg Chest 2 View  06/20/2015  CLINICAL DATA:  81 year old female with weakness and vertigo. EXAM: CHEST  2 VIEW COMPARISON:  Radiograph dated 04/19/2015 FINDINGS: Two views of the chest demonstrate minimal bibasilar atelectatic changes. There is no focal consolidation, pleural effusion, or pneumothorax. Stable cardiac silhouette. The osseous structures are grossly unremarkable. IMPRESSION: No active cardiopulmonary disease. Electronically Signed   By: Elgie Collard M.D.   On: 06/20/2015 20:13   Ct Head Wo Contrast  06/20/2015  CLINICAL DATA:  81 year old with personal history of stroke, current history of diabetes and hypertension, presenting with acute  onset of vertigo which began 2 days ago. EXAM: CT HEAD WITHOUT CONTRAST TECHNIQUE: Contiguous axial images were obtained  from the base of the skull through the vertex without intravenous contrast. COMPARISON:  CT head and MRI brain 04/02/2015 and earlier. FINDINGS: Head tilt in the gantry accounts for apparent asymmetry in the cerebral hemispheres. Old left posterior parietal cortical stroke with encephalomalacia, unchanged. Old subcortical stroke in the right occipital white matter, unchanged. Old lacunar stroke in the deep white matter of the right frontotemporal region adjacent to the atrium of the lateral ventricle, unchanged. Severe changes of small vessel disease of the white matter diffusely, unchanged. Moderate cortical and deep atrophy, unchanged. No mass lesion. No midline shift. No acute hemorrhage or hematoma. No extra-axial fluid collections. No evidence of acute infarction. No skull fracture or other focal osseous abnormality involving the skull. Minimal fluid in the inferior right mastoid air cells, unchanged. Bilateral mastoid air cells and bilateral middle ear cavities well aerated otherwise. Mucosal thickening involving the maxillary sinuses, opacification of right posterior ethmoid air cells, and minimal mucosal thickening in the sphenoid sinuses, unchanged. IMPRESSION: 1. No acute intracranial abnormality. 2. Multiple remote bilateral strokes, unchanged. 3. Stable moderate generalized atrophy and severe chronic microvascular ischemic changes of the white matter. 4. Minimal right mastoid effusion, unchanged. No evidence of otitis media. 5. Mild chronic pansinusitis, unchanged. Electronically Signed   By: Hulan Saas M.D.   On: 06/20/2015 20:12   Mr Maxine Glenn Head Wo Contrast  06/21/2015  CLINICAL DATA:  Near syncopal episode 3 hours ago. Vertigo for 2 days, not improving with meclizine. History of heart block. EXAM: MR HEAD WITHOUT CONTRAST MR CIRCLE OF WILLIS WITHOUT CONTRAST MRA OF THE NECK  WITHOUT AND WITH CONTRAST TECHNIQUE: Multiplanar, multiecho pulse sequences of the brain and surrounding structures were obtained according to standard protocol without intravenous contrast.; Angiographic images of the Circle of Willis were obtained using MRA technique without intravenous contrast.; Multiplanar and multiecho pulse sequences of the neck were obtained without and with intravenous contrast. Angiographic images of the neck were obtained using MRA technique without and with intravenous contrast. CONTRAST:  14mL MULTIHANCE GADOBENATE DIMEGLUMINE 529 MG/ML IV SOLN COMPARISON:  CT head June 20, 2015 at 1923 hours and MRI of the brain April 02, 2015 and MRA head April 01, 2015 FINDINGS: MR HEAD FINDINGS No reduced diffusion to suggest acute ischemia. Old micro hemorrhage LEFT pontomedullary junction. Ventricles and sulci are normal for patient's age. Patchy to confluent supratentorial white matter FLAIR T2 hyperintensities, to lesser extent within the pons. Bilateral occipital lobe encephalomalacia. Tiny bilateral basal ganglia and thalamus lacunar infarcts versus perivascular spaces. No midline shift, mass effect or mass lesions. No abnormal extra-axial fluid collections. Status post bilateral ocular lens implants. Mild paranasal sinus mucosal thickening without air-fluid levels. Trace RIGHT mastoid effusion. No abnormal sellar expansion. No cerebellar tonsillar ectopia. No suspicious calvarial bone marrow signal. MR CIRCLE OF WILLIS FINDINGS Mildly motion degraded examination. Anterior circulation: Normal flow related enhancement of the included cervical, petrous, cavernous and supraclinoid internal carotid arteries. Patent anterior communicating artery. High-grade stenosis LEFT M1 segment. Slow flow LEFT mid to distal segments. Normal appearance the anterior and RIGHT middle cerebral arteries. No large vessel occlusion, aneurysm. Posterior circulation: RIGHT vertebral artery is dominant. Slow  flow in intermittent loss of flow related enhancement LEFT V4 segment is similar. Basilar artery is patent, with normal flow related enhancement of the main branch vessels. Flow related enhancement of the posterior cerebral arteries. Tandem moderate stenosis proximal RIGHT posterior cerebral artery. High-grade stenosis proximal LEFT P2 segment, vessel remains patent, subsequent moderate tandem stenosis. Robust RIGHT  posterior communicating artery present. No aneurysm. MRA NECK FINDINGS Moderate motion degraded examination. Source images and MIP image were reviewed. The common carotid arteries are widely patent bilaterally. The carotid bifurcation is patent bilaterally and there is no significant carotid stenosis. Mild luminal irregularity compatible with atherosclerosis. High-grade stenosis LEFT vertebral artery origin, mild stenosis RIGHT vertebral artery origin. High-grade stenosis LEFT V4 segment. IMPRESSION: MRI HEAD: No acute intracranial process, specifically no acute ischemia. Stable chronic changes including moderate to severe chronic small vessel ischemic disease. Bilateral occipital encephalomalacia compatible with old PCA territory infarct. MRA HEAD: Mild motion degraded examination. No emergent large vessel occlusion. Stable appearance, including high-grade stenosis LEFT M1 segment ; high-grade stenosis proximal LEFT P2 segment with moderate tandem stenosis bilateral posterior cerebral arteries. High-grade stenosis LEFT V4 segment. MRA NECK: Moderately motion degraded examination. High-grade stenosis LEFT vertebral artery. Patent carotid arteries. Electronically Signed   By: Awilda Metro M.D.   On: 06/21/2015 00:02   Mr Angiogram Neck W Wo Contrast  06/21/2015  CLINICAL DATA:  Near syncopal episode 3 hours ago. Vertigo for 2 days, not improving with meclizine. History of heart block. EXAM: MR HEAD WITHOUT CONTRAST MR CIRCLE OF WILLIS WITHOUT CONTRAST MRA OF THE NECK WITHOUT AND WITH CONTRAST  TECHNIQUE: Multiplanar, multiecho pulse sequences of the brain and surrounding structures were obtained according to standard protocol without intravenous contrast.; Angiographic images of the Circle of Willis were obtained using MRA technique without intravenous contrast.; Multiplanar and multiecho pulse sequences of the neck were obtained without and with intravenous contrast. Angiographic images of the neck were obtained using MRA technique without and with intravenous contrast. CONTRAST:  14mL MULTIHANCE GADOBENATE DIMEGLUMINE 529 MG/ML IV SOLN COMPARISON:  CT head June 20, 2015 at 1923 hours and MRI of the brain April 02, 2015 and MRA head April 01, 2015 FINDINGS: MR HEAD FINDINGS No reduced diffusion to suggest acute ischemia. Old micro hemorrhage LEFT pontomedullary junction. Ventricles and sulci are normal for patient's age. Patchy to confluent supratentorial white matter FLAIR T2 hyperintensities, to lesser extent within the pons. Bilateral occipital lobe encephalomalacia. Tiny bilateral basal ganglia and thalamus lacunar infarcts versus perivascular spaces. No midline shift, mass effect or mass lesions. No abnormal extra-axial fluid collections. Status post bilateral ocular lens implants. Mild paranasal sinus mucosal thickening without air-fluid levels. Trace RIGHT mastoid effusion. No abnormal sellar expansion. No cerebellar tonsillar ectopia. No suspicious calvarial bone marrow signal. MR CIRCLE OF WILLIS FINDINGS Mildly motion degraded examination. Anterior circulation: Normal flow related enhancement of the included cervical, petrous, cavernous and supraclinoid internal carotid arteries. Patent anterior communicating artery. High-grade stenosis LEFT M1 segment. Slow flow LEFT mid to distal segments. Normal appearance the anterior and RIGHT middle cerebral arteries. No large vessel occlusion, aneurysm. Posterior circulation: RIGHT vertebral artery is dominant. Slow flow in intermittent loss of  flow related enhancement LEFT V4 segment is similar. Basilar artery is patent, with normal flow related enhancement of the main branch vessels. Flow related enhancement of the posterior cerebral arteries. Tandem moderate stenosis proximal RIGHT posterior cerebral artery. High-grade stenosis proximal LEFT P2 segment, vessel remains patent, subsequent moderate tandem stenosis. Robust RIGHT posterior communicating artery present. No aneurysm. MRA NECK FINDINGS Moderate motion degraded examination. Source images and MIP image were reviewed. The common carotid arteries are widely patent bilaterally. The carotid bifurcation is patent bilaterally and there is no significant carotid stenosis. Mild luminal irregularity compatible with atherosclerosis. High-grade stenosis LEFT vertebral artery origin, mild stenosis RIGHT vertebral artery origin. High-grade stenosis LEFT V4  segment. IMPRESSION: MRI HEAD: No acute intracranial process, specifically no acute ischemia. Stable chronic changes including moderate to severe chronic small vessel ischemic disease. Bilateral occipital encephalomalacia compatible with old PCA territory infarct. MRA HEAD: Mild motion degraded examination. No emergent large vessel occlusion. Stable appearance, including high-grade stenosis LEFT M1 segment ; high-grade stenosis proximal LEFT P2 segment with moderate tandem stenosis bilateral posterior cerebral arteries. High-grade stenosis LEFT V4 segment. MRA NECK: Moderately motion degraded examination. High-grade stenosis LEFT vertebral artery. Patent carotid arteries. Electronically Signed   By: Awilda Metro M.D.   On: 06/21/2015 00:02   Mr Brain Wo Contrast  06/21/2015  CLINICAL DATA:  Near syncopal episode 3 hours ago. Vertigo for 2 days, not improving with meclizine. History of heart block. EXAM: MR HEAD WITHOUT CONTRAST MR CIRCLE OF WILLIS WITHOUT CONTRAST MRA OF THE NECK WITHOUT AND WITH CONTRAST TECHNIQUE: Multiplanar, multiecho pulse  sequences of the brain and surrounding structures were obtained according to standard protocol without intravenous contrast.; Angiographic images of the Circle of Willis were obtained using MRA technique without intravenous contrast.; Multiplanar and multiecho pulse sequences of the neck were obtained without and with intravenous contrast. Angiographic images of the neck were obtained using MRA technique without and with intravenous contrast. CONTRAST:  14mL MULTIHANCE GADOBENATE DIMEGLUMINE 529 MG/ML IV SOLN COMPARISON:  CT head June 20, 2015 at 1923 hours and MRI of the brain April 02, 2015 and MRA head April 01, 2015 FINDINGS: MR HEAD FINDINGS No reduced diffusion to suggest acute ischemia. Old micro hemorrhage LEFT pontomedullary junction. Ventricles and sulci are normal for patient's age. Patchy to confluent supratentorial white matter FLAIR T2 hyperintensities, to lesser extent within the pons. Bilateral occipital lobe encephalomalacia. Tiny bilateral basal ganglia and thalamus lacunar infarcts versus perivascular spaces. No midline shift, mass effect or mass lesions. No abnormal extra-axial fluid collections. Status post bilateral ocular lens implants. Mild paranasal sinus mucosal thickening without air-fluid levels. Trace RIGHT mastoid effusion. No abnormal sellar expansion. No cerebellar tonsillar ectopia. No suspicious calvarial bone marrow signal. MR CIRCLE OF WILLIS FINDINGS Mildly motion degraded examination. Anterior circulation: Normal flow related enhancement of the included cervical, petrous, cavernous and supraclinoid internal carotid arteries. Patent anterior communicating artery. High-grade stenosis LEFT M1 segment. Slow flow LEFT mid to distal segments. Normal appearance the anterior and RIGHT middle cerebral arteries. No large vessel occlusion, aneurysm. Posterior circulation: RIGHT vertebral artery is dominant. Slow flow in intermittent loss of flow related enhancement LEFT V4  segment is similar. Basilar artery is patent, with normal flow related enhancement of the main branch vessels. Flow related enhancement of the posterior cerebral arteries. Tandem moderate stenosis proximal RIGHT posterior cerebral artery. High-grade stenosis proximal LEFT P2 segment, vessel remains patent, subsequent moderate tandem stenosis. Robust RIGHT posterior communicating artery present. No aneurysm. MRA NECK FINDINGS Moderate motion degraded examination. Source images and MIP image were reviewed. The common carotid arteries are widely patent bilaterally. The carotid bifurcation is patent bilaterally and there is no significant carotid stenosis. Mild luminal irregularity compatible with atherosclerosis. High-grade stenosis LEFT vertebral artery origin, mild stenosis RIGHT vertebral artery origin. High-grade stenosis LEFT V4 segment. IMPRESSION: MRI HEAD: No acute intracranial process, specifically no acute ischemia. Stable chronic changes including moderate to severe chronic small vessel ischemic disease. Bilateral occipital encephalomalacia compatible with old PCA territory infarct. MRA HEAD: Mild motion degraded examination. No emergent large vessel occlusion. Stable appearance, including high-grade stenosis LEFT M1 segment ; high-grade stenosis proximal LEFT P2 segment with moderate tandem stenosis bilateral posterior  cerebral arteries. High-grade stenosis LEFT V4 segment. MRA NECK: Moderately motion degraded examination. High-grade stenosis LEFT vertebral artery. Patent carotid arteries. Electronically Signed   By: Awilda Metro M.D.   On: 06/21/2015 00:02    Assessment/Plan Principal Problem:   Vertigo Active Problems:   Hypothyroidism   Type 2 diabetes mellitus, uncontrolled (HCC)   Diabetic neuropathy (HCC)   Dyslipidemia   Essential hypertension   GERD   PVD (peripheral vascular disease)- S/P RCE 06/18/09   Obesity (BMI 30-39.9)   Depression   Stroke [facility])   Posterior circulation  stroke (HCC)   Paroxysmal atrial fibrillation (HCC)  Vertigo: Patient had an MRI of brain which showed no any acute stroke. 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 Neurology was consulted. Dr. Lavon Paganini saw pt, Tammy Sours recommended physical therapy and no further neurodiagnostic testing recommended at this time.   -will admit to tele bed -get vestibular physical therapy and OT -Fall precaution -Orthostatic vital signs -IV fluid: Normal saline 100 mL per hour  Atrial Fibrillation: CHA2DS2-VASc Score is 8-9, needs oral anticoagulation. Patient is on Eliquis at home. INR is 1.55 on admission. Heart rate is well controlled. -continue Coreg and Eliquis  Hx of stroke: -Continue Eliquis and Lipitor  DM-II: Last A1c 8.9 on 11/06/11/14, poorly controled. Patient is taking Levemir and metformin at home -will decrease Levemir dose from  30 units twice a day to 20 units twice a day -SSI  Hypothyroidism: Last TSH was 1.78 on 03/25/15 -Continue home Synthroid  HLD: Last LDL was 76 on 04/01/15 -Continue home medications: Lipitor  GERD: -Protonix  HTN: -On Coreg and irbesartan -IV hydralazine when necessary  Depression: Stable, no suicidal or homicidal ideations. -Continue home medications: Cymbalta   DVT ppx: SQ Lovenox  Code Status: Full code Family Communication:   Yes, patient's aspirin at bed side Disposition Plan: Admit to inpatient   Date of Service 06/21/2015    Lorretta Harp [facility] Pager [phone]  If 7PM-7AM, please contact night-coverage [email or web address] Password [facility] 06/21/2015, 7:59 AM

## 2015-06-22 DIAGNOSIS — E1165 Type 2 diabetes mellitus with hyperglycemia: Secondary | ICD-10-CM

## 2015-06-22 DIAGNOSIS — E1142 Type 2 diabetes mellitus with diabetic polyneuropathy: Secondary | ICD-10-CM | POA: Diagnosis not present

## 2015-06-22 DIAGNOSIS — R42 Dizziness and giddiness: Secondary | ICD-10-CM | POA: Diagnosis not present

## 2015-06-22 DIAGNOSIS — I480 Paroxysmal atrial fibrillation: Secondary | ICD-10-CM

## 2015-06-22 DIAGNOSIS — Z794 Long term (current) use of insulin: Secondary | ICD-10-CM

## 2015-06-22 DIAGNOSIS — E1169 Type 2 diabetes mellitus with other specified complication: Secondary | ICD-10-CM

## 2015-06-22 DIAGNOSIS — I10 Essential (primary) hypertension: Secondary | ICD-10-CM | POA: Diagnosis not present

## 2015-06-22 LAB — COMPREHENSIVE METABOLIC PANEL
ALT: 15 U/L (ref 14–54)
AST: 19 U/L (ref 15–41)
Albumin: 3.5 g/dL (ref 3.5–5.0)
Alkaline Phosphatase: 88 U/L (ref 38–126)
Anion gap: 14 (ref 5–15)
BUN: 9 mg/dL (ref 6–20)
CO2: 23 mmol/L (ref 22–32)
Calcium: 9.5 mg/dL (ref 8.9–10.3)
Chloride: 101 mmol/L (ref 101–111)
Creatinine, Ser: 0.7 mg/dL (ref 0.44–1.00)
GFR calc Af Amer: >60 mL/min (ref >60)
GFR calc non Af Amer: >60 mL/min (ref >60)
Glucose, Bld: 204 mg/dL — ABNORMAL HIGH (ref 65–99)
Potassium: 3.6 mmol/L (ref 3.5–5.1)
Sodium: 138 mmol/L (ref 135–145)
Total Bilirubin: 0.5 mg/dL (ref 0.3–1.2)
Total Protein: 7 g/dL (ref 6.5–8.1)

## 2015-06-22 LAB — GLUCOSE, CAPILLARY
Glucose-Capillary: 183 mg/dL — ABNORMAL HIGH (ref 65–99)
Glucose-Capillary: 231 mg/dL — ABNORMAL HIGH (ref 65–99)

## 2015-06-22 LAB — CBC
HCT: 41.3 % (ref 36.0–46.0)
Hemoglobin: 13.6 g/dL (ref 12.0–15.0)
MCH: 26.7 pg (ref 26.0–34.0)
MCHC: 32.9 g/dL (ref 30.0–36.0)
MCV: 81.1 fL (ref 78.0–100.0)
Platelets: 272 10*3/uL (ref 150–400)
RBC: 5.09 MIL/uL (ref 3.87–5.11)
RDW: 17.1 % — ABNORMAL HIGH (ref 11.5–15.5)
WBC: 11.8 10*3/uL — ABNORMAL HIGH (ref 4.0–10.5)

## 2015-06-22 LAB — TROPONIN I: Troponin I: <0.03 ng/mL (ref <0.031)

## 2015-06-22 MED ORDER — IRBESARTAN 150 MG PO TABS: 75.0000 mg | ORAL_TABLET | Freq: Every day | ORAL | Status: DC

## 2015-06-22 MED ORDER — MECLIZINE HCL 25 MG PO TABS: 25.0000 mg | ORAL_TABLET | Freq: Three times a day (TID) | ORAL | Status: DC | PRN

## 2015-06-22 MED ORDER — IRBESARTAN 75 MG PO TABS: 75.0000 mg | ORAL_TABLET | Freq: Every day | ORAL | Status: DC

## 2015-06-22 NOTE — Progress Notes (Signed)
Inpatient Diabetes Program Recommendations  AACE/ADA: New Consensus Statement on Inpatient Glycemic Control (2015)  Target Ranges:  Prepandial:   less than 140 mg/dL      Peak postprandial:   less than 180 mg/dL (1-2 hours)      Critically ill patients:  140 - 180 mg/dL   Results for SHELBYLYNN, WALCZYK (MRN [id number]) as of 06/22/2015 12:35  Ref. Range 06/21/2015 08:41 06/21/2015 12:16 06/21/2015 16:27 06/21/2015 22:25  Glucose-Capillary Latest Ref Range: 65-99 mg/dL 409 (H) 811 (H) 914 (H) 243 (H)   Results for WESTON, FULCO (MRN [id number]) as of 06/22/2015 12:35  Ref. Range 06/22/2015 06:37 06/22/2015 11:22  Glucose-Capillary Latest Ref Range: 65-99 mg/dL 086 (H) 578 (H)    Admit Vertigo.   History: DM, CVA, COPD  Home Insulin: Levemir 30 units bid              Humalog 9 units breakfast/ 14 units dinner              Metformin 1000 mg bid  Current Orders: Levemir 20 units bid        Novolog Sensitive SSI (0-9 units) TID AC      MD- Please consider the following in-hospital insulin adjustments:  1. Increase Levemir to 25 units bid  2. Start Novolog Meal Coverage- Novolog 4 units tidwc      --Will follow patient during hospitalization--  Ambrose Finland RN, MSN, CDE Diabetes Coordinator Inpatient Glycemic Control Team Team Pager: [phone] (8a-5p)

## 2015-06-22 NOTE — Care Management Note (Signed)
Case Management Note  Patient Details  Name: AAYRA HORNBAKER MRN: [id number] Date of Birth: 1934/08/10  Subjective/Objective:                    Action/Plan: PT recommending SNF. Patient and husband refusing SNF and wanting to go home with home health services. CM met with the patient and her husband and provided a list of [place] agencies. They selected [facility]. Manuela Schwartz with [facility] notified and accepted the referral. Will updated bedside RN.   Expected Discharge Date:                  Expected Discharge Plan:  [place]  In-House Referral:     Discharge planning Services  CM Consult  Post Acute Care Choice:    Choice offered to:  Spouse  DME Arranged:    DME Agency:     HH Arranged:  PT, OT HH Agency:  [place]  Status of Service:  Completed, signed off  Medicare Important Message Given:    Date Medicare IM Given:    Medicare IM give by:    Date Additional Medicare IM Given:    Additional Medicare Important Message give by:     If discussed at [place] of Stay Meetings, dates discussed:    Additional Comments:  Pollie Friar, RN 06/22/2015, 10:35 AM

## 2015-06-22 NOTE — Progress Notes (Signed)
Physical Therapy Treatment Patient Details Name: Casey Harrell MRN: [id number] DOB: Oct 07, 1934 Today's Date: 06/22/2015    History of Present Illness 81 y.o. female admitted to [facility] on 06/20/15 for acute onset of dizziness.  MRI negative for actue event.  MRA showed stenosis of left vertebral artery and L proximal MCA.  Pt with significant PMHx of DM, peripherial neuropathy, PVD, vertigo, stroke, PAF, HTN, COPD, GERD, R foot surgery x2, and RCE.    PT Comments    Pt with improved ability to ambulate with compensatory techniques however con't to be very debilitated from function stand point due to freq onset of dizziness. Vestibular assessment completed however suspect symptoms are from the severe vertebral artery stenosis. Suggested pt to minimize excessive cervcal rotation to R or L and to focus on non-moving objects to help minimize dizziness with mobility. Pt encouraged patient to use w/c as primary mode of mobility to minimize fall risk. Spouse reports he will provide 24/7 assist. Pt to use BSC at night due to freq urination.  Follow Up Recommendations  Home health PT;Supervision/Assistance - 24 hour     Equipment Recommendations  None recommended by PT    Recommendations for Other Services       Precautions / Restrictions Precautions Precautions: Fall Precaution Comments: severe vertebral artery stenosis however unable to stent. on medicine to increase BP Restrictions Weight Bearing Restrictions: No    Mobility  Bed Mobility Overal bed mobility: Needs Assistance Bed Mobility: Sidelying to Sit Rolling: Supervision Sidelying to sit: Min assist       General bed mobility comments: v/c's to focus on non-moving object to minimize dizziness  Transfers Overall transfer level: Needs assistance   Transfers: Sit to/from Stand Sit to Stand: Min assist         General transfer comment: v/c's to focus on non-moving object, minA to steady  Ambulation/Gait Ambulation/Gait  assistance: Min assist;+2 safety/equipment (2nd person for chair follow) Ambulation Distance (Feet): 30 Feet Assistive device: Rolling walker (2 wheeled) Gait Pattern/deviations: Step-through pattern;Decreased stride length;Shuffle Gait velocity: decreased   General Gait Details: decreased step height due to being guarded and unsteady due to dizziness. used compensatory technique of focusing on non-moving object which improved tolerance however as soon as pt moved her head she would become dizzy and LEs would become weak.   Stairs            Wheelchair Mobility    Modified Rankin (Stroke Patients Only)       Balance Overall balance assessment: Needs assistance Sitting-balance support: Feet supported Sitting balance-Leahy Scale: Fair     Standing balance support: Bilateral upper extremity supported Standing balance-Leahy Scale: Poor Standing balance comment: min guard to steady pt, improved stability with focusing on non-moving object                    Cognition Arousal/Alertness: Awake/alert Behavior During Therapy: WFL for tasks assessed/performed Overall Cognitive Status: Within Functional Limits for tasks assessed                      Exercises Other Exercises Other Exercises: worked on compensatory techniques of turning head first to focus on non-moving objects during all mobility and moving slow.    General Comments General comments (skin integrity, edema, etc.): Vestibular Assessment: pt with impaired smoot pursuit and saccadic mvmt and looses focus1/2 way through L field when tracking. pt  over shoot when doing saccadic mvmts to the L as well. attempted to test  for poster canal R and L BPPV however unable to stimulate nystagmus but pt with c/o of lightheadedness. Pt then unable to focus and became anxious. returned to sitting EOB and instructed to focus on non-moving object and dizziness subsided. Pt unable to tolerate lateral head turning to L and  R without onset of dizziness      Pertinent Vitals/Pain Pain Assessment: No/denies pain    Home Living Family/patient expects to be discharged to:: Skilled nursing facility Living Arrangements: Spouse/significant other Available Help at Discharge: Family;Available 24 hours/day (22 y.o. granddaughter stays with her during the day) Type of Home: House Home Access: Stairs to enter Entrance Stairs-Rails: Right;Left Home Layout: One level Home Equipment: Cane - single point;Walker - 2 wheels;Shower seat - built in;Bedside commode;Walker - 4 wheels;Wheelchair - manual Additional Comments: per PT eval, Pt with h/o vertigo PTA, but only when going to supine in the bed (she sleeps on her right side).  Pt reports MD "who treats vertigo exclusively" told her to close her eyes and open her mouth wide to make the symptoms go away.      Prior Function Level of Independence: Independent with assistive device(s)      Comments: uses a cane most of the time, Pt reports she has to use RW for the past few days at home   PT Goals (current goals can now be found in the care plan section) Acute Rehab PT Goals Patient Stated Goal: feel better Progress towards PT goals: Progressing toward goals    Frequency  Min 4X/week    PT Plan Current plan remains appropriate    Co-evaluation             End of Session Equipment Utilized During Treatment: Gait belt Activity Tolerance:  (limited by dizziness) Patient left: in chair;with call bell/phone within reach;with nursing/sitter in room     Time: 1610-9604 PT Time Calculation (min) (ACUTE ONLY): 32 min  Charges:  $Gait Training: 8-22 mins $Therapeutic Activity: 8-22 mins                    G Codes:      Marcene Brawn 06/22/2015, 1:52 PM   Lewis Shock, PT, DPT Pager #: [phone] Office #: [phone]

## 2015-06-22 NOTE — Progress Notes (Signed)
CSW went to speak with patient regarding current recommendations for SNF placement. Patient's husband Ephriam Knuckles was at bedside. Patient provided CSW with permission to speak while husband is in room. CSW informed patient and husband that PT is currently recommending short term rehab before returning home. Patient and husband informed CSW that they are not interested in SNF placement and would prefer Home Health for the patient. No further needs were requested at this time by patient and husband. CSW to sign off.   CSW informed Case Development worker, international aid.   Please consult if further CSW needs arise.   Fernande Boyden, LCSWA Clinical Social Worker [facility] Ph: [phone]

## 2015-06-22 NOTE — Progress Notes (Signed)
   06/21/15 1105  PT G-Codes **NOT FOR INPATIENT CLASS**  Functional Assessment Tool Used assist level  Functional Limitation Mobility: Walking and moving around  Mobility: Walking and Moving Around Current Status [phone]) CK  Mobility: Walking and Moving Around Goal Status [phone]) CI  06/22/2015 late entry g-code Charlis Harner B. Delainie Chavana, PT, DPT [phone]

## 2015-06-22 NOTE — Progress Notes (Signed)
Pt d/c to home by car with family. Assessment stable. Prescriptions given. All questions answered. 

## 2015-06-22 NOTE — Discharge Instructions (Signed)
Take her Antivert as needed. Make appoint with follow-up with your regular doctor. Return for any new or worse symptoms. Vertigo Vertigo means that you feel like you are moving when you are not. Vertigo can also make you feel like things around you are moving when they are not. This feeling can come and go at any time. Vertigo often goes away on its own. HOME CARE  Avoid making fast movements.  Avoid driving.  Avoid using heavy machinery.  Avoid doing any task or activity that might cause danger to you or other people if you would have a vertigo attack while you are doing it.  Sit down right away if you feel dizzy or have trouble with your balance.  Take over-the-counter and prescription medicines only as told by your doctor.  Follow instructions from your doctor about which positions or movements you should avoid.  Drink enough fluid to keep your pee (urine) clear or pale yellow.  Keep all follow-up visits as told by your doctor. This is important. GET HELP IF:  Medicine does not help your vertigo.  You have a fever.  Your problems get worse or you have new symptoms.  Your family or friends see changes in your behavior.  You feel sick to your stomach (nauseous) or you throw up (vomit).  You have a "pins and needles" feeling or you are numb in part of your body. GET HELP RIGHT AWAY IF:  You have trouble moving or talking.  You are always dizzy.  You pass out (faint).  You get very bad headaches.  You feel weak or have trouble using your hands, arms, or legs.  You have changes in your hearing.  You have changes in your seeing (vision).  You get a stiff neck.  Bright light starts to bother you.   This information is not intended to replace advice given to you by your health care provider. Make sure you discuss any questions you have with your health care provider.   Document Released: 02/01/2008 Document Revised: 01/13/2015 Document Reviewed:  08/17/2014 Elsevier Interactive Patient Education 2016 Elsevier Inc. Benign Positional Vertigo Vertigo is the feeling that you or your surroundings are moving when they are not. Benign positional vertigo is the most common form of vertigo. The cause of this condition is not serious (is benign). This condition is triggered by certain movements and positions (is positional). This condition can be dangerous if it occurs while you are doing something that could endanger you or others, such as driving.  CAUSES In many cases, the cause of this condition is not known. It may be caused by a disturbance in an area of the inner ear that helps your brain to sense movement and balance. This disturbance can be caused by a viral infection (labyrinthitis), head injury, or repetitive motion. RISK FACTORS This condition is more likely to develop in:  Women.  People who are 67 years of age or older. SYMPTOMS Symptoms of this condition usually happen when you move your head or your eyes in different directions. Symptoms may start suddenly, and they usually last for less than a minute. Symptoms may include:  Loss of balance and falling.  Feeling like you are spinning or moving.  Feeling like your surroundings are spinning or moving.  Nausea and vomiting.  Blurred vision.  Dizziness.  Involuntary eye movement (nystagmus). Symptoms can be mild and cause only slight annoyance, or they can be severe and interfere with daily life. Episodes of benign positional vertigo may  return (recur) over time, and they may be triggered by certain movements. Symptoms may improve over time. DIAGNOSIS This condition is usually diagnosed by medical history and a physical exam of the head, neck, and ears. You may be referred to a health care provider who specializes in ear, nose, and throat (ENT) problems (otolaryngologist) or a provider who specializes in disorders of the nervous system (neurologist). You may have additional  testing, including:  MRI.  A CT scan.  Eye movement tests. Your health care provider may ask you to change positions quickly while he or she watches you for symptoms of benign positional vertigo, such as nystagmus. Eye movement may be tested with an electronystagmogram (ENG), caloric stimulation, the Dix-Hallpike test, or the roll test.  An electroencephalogram (EEG). This records electrical activity in your brain.  Hearing tests. TREATMENT Usually, your health care provider will treat this by moving your head in specific positions to adjust your inner ear back to normal. Surgery may be needed in severe cases, but this is rare. In some cases, benign positional vertigo may resolve on its own in 2-4 weeks. HOME CARE INSTRUCTIONS Safety  Move slowly.Avoid sudden body or head movements.  Avoid driving.  Avoid operating heavy machinery.  Avoid doing any tasks that would be dangerous to you or others if a vertigo episode would occur.  If you have trouble walking or keeping your balance, try using a cane for stability. If you feel dizzy or unstable, sit down right away.  Return to your normal activities as told by your health care provider. Ask your health care provider what activities are safe for you. General Instructions  Take over-the-counter and prescription medicines only as told by your health care provider.  Avoid certain positions or movements as told by your health care provider.  Drink enough fluid to keep your urine clear or pale yellow.  Keep all follow-up visits as told by your health care provider. This is important. SEEK MEDICAL CARE IF:  You have a fever.  Your condition gets worse or you develop new symptoms.  Your family or friends notice any behavioral changes.  Your nausea or vomiting gets worse.  You have numbness or a "pins and needles" sensation. SEEK IMMEDIATE MEDICAL CARE IF:  You have difficulty speaking or moving.  You are always dizzy.  You  faint.  You develop severe headaches.  You have weakness in your legs or arms.  You have changes in your hearing or vision.  You develop a stiff neck.  You develop sensitivity to light.   This information is not intended to replace advice given to you by your health care provider. Make sure you discuss any questions you have with your health care provider.   Document Released: 01/30/2006 Document Revised: 01/13/2015 Document Reviewed: 08/17/2014 Elsevier Interactive Patient Education [employer]

## 2015-06-22 NOTE — Evaluation (Addendum)
Occupational Therapy Evaluation Patient Details Name: Casey Harrell MRN: [id number] DOB: March 14, 1935 Today's Date: 06/22/2015    History of Present Illness 81 y.o. female admitted to [facility] on 06/20/15 for acute onset of dizziness.  MRI negative for acute event.  MRA showed stenosis of left vertebral artery and L proximal MCA.  Pt with significant PMHx of DM, peripherial neuropathy, PVD, vertigo, stroke, PAF, HTN, COPD, GERD, R foot surgery x2, and RCE.   Clinical Impression   Pt admitted with above. Pt independent with ADLs, PTA. Feel pt will benefit from acute OT to increase independence prior to d/c. Plan is to go to SNF upon d/c. Had pt focus on a letter in session as well as encouraged slow movements to try to help with dizziness.    Follow Up Recommendations  SNF    Equipment Recommendations  Other (comment) (defer to next venue)    Recommendations for Other Services       Precautions / Restrictions Precautions Precautions: Fall Precaution Comments: pt blacked out with PT on eval Restrictions Weight Bearing Restrictions: No      Mobility Bed Mobility Overal bed mobility: Needs Assistance Bed Mobility: Rolling;Sidelying to Sit Rolling: Supervision Sidelying to sit: Min assist       General bed mobility comments: assist with trunk to come to sitting. Had pt focus on a letter and encouraged to move slowly.  Transfers Overall transfer level: Needs assistance   Transfers: Sit to/from Stand Sit to Stand: Min assist         General transfer comment: used OT's arm to stand for support    Balance          used OT's arm for support for standing balance.                                  ADL Overall ADL's : Needs assistance/impaired Eating/Feeding: Independent;Bed level                   Lower Body Dressing: Moderate assistance;Sit to/from stand   Toilet Transfer: Minimal assistance (sit to stand from bed)                   Vision   wears reading glasses.   Perception     Praxis      Pertinent Vitals/Pain Pain Assessment: No/denies pain     Hand Dominance     Extremity/Trunk Assessment Upper Extremity Assessment Upper Extremity Assessment: Generalized weakness (Residual weakness in Rt shoulder flexors from previous CVA)   Lower Extremity Assessment Lower Extremity Assessment: Defer to PT evaluation       Communication Communication Communication: No difficulties   Cognition Arousal/Alertness: Awake/alert Behavior During Therapy: WFL for tasks assessed/performed Overall Cognitive Status: Within Functional Limits for tasks assessed                     General Comments       Exercises       Shoulder Instructions      Home Living Family/patient expects to be discharged to:: Skilled nursing facility Living Arrangements: Spouse/significant other Available Help at Discharge: Family;Available 24 hours/day (22 y.o. granddaughter stays with her during the day) Type of Home: House Home Access: Stairs to enter [employer] of Steps: 4 Entrance Stairs-Rails: Right;Left Home Layout: One level     Bathroom Shower/Tub: Walk-in Scientific laboratory technician: Standard  Home Equipment: Gilmer Mor - single point;Walker - 2 wheels;Shower seat - built in;Bedside commode;Walker - 4 wheels;Wheelchair - manual   Additional Comments: per PT eval, Pt with h/o vertigo PTA, but only when going to supine in the bed (she sleeps on her right side).  Pt reports MD "who treats vertigo exclusively" told her to close her eyes and open her mouth wide to make the symptoms go away.        Prior Functioning/Environment Level of Independence: Independent with assistive device(s)        Comments: uses a cane most of the time, Pt reports she has to use RW for the past few days at home    OT Diagnosis: Generalized weakness   OT Problem List: Decreased strength;Decreased activity  tolerance;Impaired balance (sitting and/or standing);Decreased knowledge of use of DME or AE   OT Treatment/Interventions: Self-care/ADL training;DME and/or AE instruction;Therapeutic activities;Therapeutic exercise;Balance training;Patient/family education;Visual/perceptual remediation/compensation    OT Goals(Current goals can be found in the care plan section) Acute Rehab OT Goals Patient Stated Goal: not stated OT Goal Formulation: With patient Time For Goal Achievement: 06/29/15 Potential to Achieve Goals: Good ADL Goals Pt Will Perform Lower Body Bathing: with min guard assist;sit to/from stand Pt Will Perform Lower Body Dressing: with min guard assist;sit to/from stand Pt Will Transfer to Toilet: ambulating;bedside commode;with min guard assist Pt Will Perform Toileting - Clothing Manipulation and hygiene: sit to/from stand;with min guard assist Additional ADL Goal #1: Pt will use compensatory techniques for dizziness with minimal verbal cues.  OT Frequency: Min 2X/week   Barriers to D/C:            Co-evaluation              End of Session Equipment Utilized During Treatment: Gait belt  Activity Tolerance: Other (comment) (dizziness) Patient left: in bed;with call bell/phone within reach;with bed alarm set;with SCD's reapplied;with family/visitor present   Time: 0911-0929 OT Time Calculation (min): 18 min Charges:  OT General Charges $OT Visit: 1 Procedure OT Evaluation $OT Eval Moderate Complexity: 1 Procedure G-Codes: OT G-codes **NOT FOR INPATIENT CLASS** Functional Assessment Tool Used: clinical judgment Functional Limitation: Self care Self Care Current Status (W0981): At least 40 percent but less than 60 percent impaired, limited or restricted Self Care Goal Status (X9147): At least 1 percent but less than 20 percent impaired, limited or restricted  Earlie Raveling OTR/L [phone] 06/22/2015, 11:05 AM

## 2015-06-22 NOTE — Discharge Summary (Signed)
Physician Discharge Summary  Casey Harrell:[id number] DOB: [birth year] DOA: 06/20/2015  PCP: [name], MD  Admit date: 06/20/2015 Discharge date: 06/22/2015  Time spent: 35 minutes  Recommendations for Outpatient Follow-up:  1. Follow-up with neurology in 3 weeks 2. Up with primary care doctor in 2-4 weeks. 3. She needs to have an elevated systolic  blood pressure around 160 is her goal. 4. Titrate antihyperglycemic agents an outpatient, recommend adding short-acting insulin.   Discharge Diagnoses:  Principal Problem:   Vertigo Active Problems:   Type 2 diabetes mellitus, uncontrolled (HCC)   Hypothyroidism   Diabetic neuropathy (HCC)   Dyslipidemia   Essential hypertension   GERD   PVD (peripheral vascular disease)- S/P RCE 06/18/09   Obesity (BMI 30-39.9)   Depression   Stroke [facility])   Posterior circulation stroke (HCC)   Paroxysmal atrial fibrillation [facility])   Discharge Condition: stable  Diet recommendation: heart healthy  There were no vitals filed for this visit.  History of present illness:  PMH of vertigo, stroke, atrial fibrillation on Eliquis, hypertension, hyperlipidemia, diabetes mellitus, COPD, asthma, GERD, hypothyroidism, depression, carotid artery endarterectomy, who presents with vertigo.  She had left MCA small stroke in 03/2015 with mild residual right upper extremity weakness. Today she started having severe dizziness and gait instability, which is new from her baseline. She does not have a new unilateral weakness, leading to neural hearing loss. No urinating. No headache. Patient does not have chest pain, abdominal pain, shortness breath, cough, diarrhea, symptoms of UTI.  Hospital Course:  Vertigo: Question is this is BPV  neurology was consulted who recommended physical therapy of meclizine and no further workup.  Chronic Atrial fibrillation: CHA2DS2-VASc Score is 8-9, needs oral anticoagulation.  History of stroke: Continue Lipitor and  Eluquis.  She needs to be allowed permissive hypertension due to her stenotic vertebral lesion  Her systolic blood pressure was around 160.   Diabetes mellitus type 2: A1c is 8.9, she will need further titration of her antihyperglycemic agents and outpatient.  Cardizem: Continue Synthroid.  Hyperlipidemia: Continue Lipitor.   Procedures:  CT head 2.12.2016  MRA neck  MRI brain  Consultations:  Neurology  Discharge Exam: Filed Vitals:   06/22/15 0739 06/22/15 0955  BP:  172/70  Pulse: 75 66  Temp:  98.4 F (36.9 C)  Resp: 18 18    General: A&O x3 Cardiovascular: RRR Respiratory: good air movement CTA B/L  Discharge Instructions   Discharge Instructions    Diet - low sodium heart healthy    Complete by:  As directed      Increase activity slowly    Complete by:  As directed           Current Discharge Medication List    START taking these medications   Details  irbesartan (AVAPRO) 75 MG tablet Take 1 tablet (75 mg total) by mouth daily. Qty: 30 tablet, Refills: 0      CONTINUE these medications which have CHANGED   Details  meclizine (ANTIVERT) 25 MG tablet Take 1 tablet (25 mg total) by mouth 3 (three) times daily as needed for dizziness. Qty: 30 tablet, Refills: 0      CONTINUE these medications which have NOT CHANGED   Details  albuterol (ACCUNEB) 1.25 MG/3ML nebulizer solution Take 3 mLs (1.25 mg total) by nebulization every 4 (four) hours as needed for wheezing. Reported on 04/26/2015 Qty: 75 mL, Refills: 12    apixaban (ELIQUIS) 5 MG TABS tablet Take 1 tablet (5 mg  total) by mouth 2 (two) times daily. Qty: 60 tablet, Refills: 3    atorvastatin (LIPITOR) 40 MG tablet Take 1 tablet (40 mg total) by mouth 1 day or 1 dose. Qty: 90 tablet, Refills: 2    budesonide (PULMICORT) 0.25 MG/2ML nebulizer solution Take 2 mLs (0.25 mg total) by nebulization 2 (two) times daily. Qty: 60 mL, Refills: 1    carvedilol (COREG) 6.25 MG tablet Take 1  tablet (6.25 mg total) by mouth 2 (two) times daily with a meal. Qty: 180 tablet, Refills: 2    Cholecalciferol (VITAMIN D3) 2000 UNITS TABS Take 2,000 Units by mouth daily. Reported on 04/26/2015    DULoxetine (CYMBALTA) 60 MG capsule TAKE 1 CAPSULE BY MOUTH DAILY Qty: 90 capsule, Refills: 1    HUMALOG KWIKPEN 100 UNIT/ML KiwkPen INJECT 9-14 UNITS SUB-Q TWICE A DAY WITH A MEAL. 9U BEFORE BREAKFAST AND 14 UNITS AFTER SUPPER Refills: 5    Insulin Detemir (LEVEMIR FLEXTOUCH) 100 UNIT/ML Pen Inject 40 Units into the skin 2 (two) times daily. Qty: 15 mL, Refills: 5    ipratropium-albuterol (DUONEB) 0.5-2.5 (3) MG/3ML SOLN Take 3 mLs by nebulization every 6 (six) hours as needed (shortness of breath or wheezing). Qty: 360 mL, Refills: 0    levothyroxine (SYNTHROID, LEVOTHROID) 50 MCG tablet Take 1 tablet (50 mcg total) by mouth daily before breakfast. Qty: 90 tablet, Refills: 0    LYRICA 150 MG capsule TAKE ONE CAPSULE BY MOUTH EVERY DAY Qty: 90 capsule, Refills: 1    metFORMIN (GLUCOPHAGE) 1000 MG tablet Take 1 tablet (1,000 mg total) by mouth 2 (two) times daily with a meal. Qty: 180 tablet, Refills: 2    ondansetron (ZOFRAN ODT) 4 MG disintegrating tablet Take 1 tablet (4 mg total) by mouth every 8 (eight) hours as needed for nausea or vomiting. Qty: 10 tablet, Refills: 0    pantoprazole (PROTONIX) 40 MG tablet TAKE 1 TABLET BY MOUTH TWICE DAILY Qty: 180 tablet, Refills: 1      STOP taking these medications     Calcium Citrate (CITRACAL PO)      valsartan (DIOVAN) 160 MG tablet        Allergies  Allergen Reactions  . Doxycycline Hyclate Nausea And Vomiting  . Morphine Sulfate Other (See Comments)    REACTION: hallucinations  . Iohexol Hives     Code: HIVES, Desc: pt. states she breaks out in hives 06/15/08   . Moxifloxacin Other (See Comments)    REACTION: unknown   Follow-up Information    Follow up with [name], MD In 2 weeks.   Specialty:  Family  Medicine   Contact information:   [address] [place] [address] [phone]       Follow up with Xu,Jindong, MD In 2 weeks.   Specialty:  Neurology   Why:  hospital follow up   Contact information:   [address] [place] [address] [phone]        The results of significant diagnostics from this hospitalization (including imaging, microbiology, ancillary and laboratory) are listed below for reference.    Significant Diagnostic Studies: Dg Chest 2 View  06/20/2015  CLINICAL DATA:  81 year old female with weakness and vertigo. EXAM: CHEST  2 VIEW COMPARISON:  Radiograph dated 04/19/2015 FINDINGS: Two views of the chest demonstrate minimal bibasilar atelectatic changes. There is no focal consolidation, pleural effusion, or pneumothorax. Stable cardiac silhouette. The osseous structures are grossly unremarkable. IMPRESSION: No active cardiopulmonary disease. Electronically Signed  By: Elgie Collard M.D.   On: 06/20/2015 20:13   Ct Head Wo Contrast  06/20/2015  CLINICAL DATA:  81 year old with personal history of stroke, current history of diabetes and hypertension, presenting with acute onset of vertigo which began 2 days ago. EXAM: CT HEAD WITHOUT CONTRAST TECHNIQUE: Contiguous axial images were obtained from the base of the skull through the vertex without intravenous contrast. COMPARISON:  CT head and MRI brain 04/02/2015 and earlier. FINDINGS: Head tilt in the gantry accounts for apparent asymmetry in the cerebral hemispheres. Old left posterior parietal cortical stroke with encephalomalacia, unchanged. Old subcortical stroke in the right occipital white matter, unchanged. Old lacunar stroke in the deep white matter of the right frontotemporal region adjacent to the atrium of the lateral ventricle, unchanged. Severe changes of small vessel disease of the white matter diffusely, unchanged. Moderate cortical and deep atrophy, unchanged. No mass  lesion. No midline shift. No acute hemorrhage or hematoma. No extra-axial fluid collections. No evidence of acute infarction. No skull fracture or other focal osseous abnormality involving the skull. Minimal fluid in the inferior right mastoid air cells, unchanged. Bilateral mastoid air cells and bilateral middle ear cavities well aerated otherwise. Mucosal thickening involving the maxillary sinuses, opacification of right posterior ethmoid air cells, and minimal mucosal thickening in the sphenoid sinuses, unchanged. IMPRESSION: 1. No acute intracranial abnormality. 2. Multiple remote bilateral strokes, unchanged. 3. Stable moderate generalized atrophy and severe chronic microvascular ischemic changes of the white matter. 4. Minimal right mastoid effusion, unchanged. No evidence of otitis media. 5. Mild chronic pansinusitis, unchanged. Electronically Signed   By: Hulan Saas M.D.   On: 06/20/2015 20:12   Mr Maxine Glenn Head Wo Contrast  06/21/2015  CLINICAL DATA:  Near syncopal episode 3 hours ago. Vertigo for 2 days, not improving with meclizine. History of heart block. EXAM: MR HEAD WITHOUT CONTRAST MR CIRCLE OF WILLIS WITHOUT CONTRAST MRA OF THE NECK WITHOUT AND WITH CONTRAST TECHNIQUE: Multiplanar, multiecho pulse sequences of the brain and surrounding structures were obtained according to standard protocol without intravenous contrast.; Angiographic images of the Circle of Willis were obtained using MRA technique without intravenous contrast.; Multiplanar and multiecho pulse sequences of the neck were obtained without and with intravenous contrast. Angiographic images of the neck were obtained using MRA technique without and with intravenous contrast. CONTRAST:  14mL MULTIHANCE GADOBENATE DIMEGLUMINE 529 MG/ML IV SOLN COMPARISON:  CT head June 20, 2015 at 1923 hours and MRI of the brain April 02, 2015 and MRA head April 01, 2015 FINDINGS: MR HEAD FINDINGS No reduced diffusion to suggest acute  ischemia. Old micro hemorrhage LEFT pontomedullary junction. Ventricles and sulci are normal for patient's age. Patchy to confluent supratentorial white matter FLAIR T2 hyperintensities, to lesser extent within the pons. Bilateral occipital lobe encephalomalacia. Tiny bilateral basal ganglia and thalamus lacunar infarcts versus perivascular spaces. No midline shift, mass effect or mass lesions. No abnormal extra-axial fluid collections. Status post bilateral ocular lens implants. Mild paranasal sinus mucosal thickening without air-fluid levels. Trace RIGHT mastoid effusion. No abnormal sellar expansion. No cerebellar tonsillar ectopia. No suspicious calvarial bone marrow signal. MR CIRCLE OF WILLIS FINDINGS Mildly motion degraded examination. Anterior circulation: Normal flow related enhancement of the included cervical, petrous, cavernous and supraclinoid internal carotid arteries. Patent anterior communicating artery. High-grade stenosis LEFT M1 segment. Slow flow LEFT mid to distal segments. Normal appearance the anterior and RIGHT middle cerebral arteries. No large vessel occlusion, aneurysm. Posterior circulation: RIGHT vertebral artery is dominant. Slow flow in  intermittent loss of flow related enhancement LEFT V4 segment is similar. Basilar artery is patent, with normal flow related enhancement of the main branch vessels. Flow related enhancement of the posterior cerebral arteries. Tandem moderate stenosis proximal RIGHT posterior cerebral artery. High-grade stenosis proximal LEFT P2 segment, vessel remains patent, subsequent moderate tandem stenosis. Robust RIGHT posterior communicating artery present. No aneurysm. MRA NECK FINDINGS Moderate motion degraded examination. Source images and MIP image were reviewed. The common carotid arteries are widely patent bilaterally. The carotid bifurcation is patent bilaterally and there is no significant carotid stenosis. Mild luminal irregularity compatible with  atherosclerosis. High-grade stenosis LEFT vertebral artery origin, mild stenosis RIGHT vertebral artery origin. High-grade stenosis LEFT V4 segment. IMPRESSION: MRI HEAD: No acute intracranial process, specifically no acute ischemia. Stable chronic changes including moderate to severe chronic small vessel ischemic disease. Bilateral occipital encephalomalacia compatible with old PCA territory infarct. MRA HEAD: Mild motion degraded examination. No emergent large vessel occlusion. Stable appearance, including high-grade stenosis LEFT M1 segment ; high-grade stenosis proximal LEFT P2 segment with moderate tandem stenosis bilateral posterior cerebral arteries. High-grade stenosis LEFT V4 segment. MRA NECK: Moderately motion degraded examination. High-grade stenosis LEFT vertebral artery. Patent carotid arteries. Electronically Signed   By: Awilda Metro M.D.   On: 06/21/2015 00:02   Mr Angiogram Neck W Wo Contrast  06/21/2015  CLINICAL DATA:  Near syncopal episode 3 hours ago. Vertigo for 2 days, not improving with meclizine. History of heart block. EXAM: MR HEAD WITHOUT CONTRAST MR CIRCLE OF WILLIS WITHOUT CONTRAST MRA OF THE NECK WITHOUT AND WITH CONTRAST TECHNIQUE: Multiplanar, multiecho pulse sequences of the brain and surrounding structures were obtained according to standard protocol without intravenous contrast.; Angiographic images of the Circle of Willis were obtained using MRA technique without intravenous contrast.; Multiplanar and multiecho pulse sequences of the neck were obtained without and with intravenous contrast. Angiographic images of the neck were obtained using MRA technique without and with intravenous contrast. CONTRAST:  14mL MULTIHANCE GADOBENATE DIMEGLUMINE 529 MG/ML IV SOLN COMPARISON:  CT head June 20, 2015 at 1923 hours and MRI of the brain April 02, 2015 and MRA head April 01, 2015 FINDINGS: MR HEAD FINDINGS No reduced diffusion to suggest acute ischemia. Old micro  hemorrhage LEFT pontomedullary junction. Ventricles and sulci are normal for patient's age. Patchy to confluent supratentorial white matter FLAIR T2 hyperintensities, to lesser extent within the pons. Bilateral occipital lobe encephalomalacia. Tiny bilateral basal ganglia and thalamus lacunar infarcts versus perivascular spaces. No midline shift, mass effect or mass lesions. No abnormal extra-axial fluid collections. Status post bilateral ocular lens implants. Mild paranasal sinus mucosal thickening without air-fluid levels. Trace RIGHT mastoid effusion. No abnormal sellar expansion. No cerebellar tonsillar ectopia. No suspicious calvarial bone marrow signal. MR CIRCLE OF WILLIS FINDINGS Mildly motion degraded examination. Anterior circulation: Normal flow related enhancement of the included cervical, petrous, cavernous and supraclinoid internal carotid arteries. Patent anterior communicating artery. High-grade stenosis LEFT M1 segment. Slow flow LEFT mid to distal segments. Normal appearance the anterior and RIGHT middle cerebral arteries. No large vessel occlusion, aneurysm. Posterior circulation: RIGHT vertebral artery is dominant. Slow flow in intermittent loss of flow related enhancement LEFT V4 segment is similar. Basilar artery is patent, with normal flow related enhancement of the main branch vessels. Flow related enhancement of the posterior cerebral arteries. Tandem moderate stenosis proximal RIGHT posterior cerebral artery. High-grade stenosis proximal LEFT P2 segment, vessel remains patent, subsequent moderate tandem stenosis. Robust RIGHT posterior communicating artery present. No aneurysm. MRA  NECK FINDINGS Moderate motion degraded examination. Source images and MIP image were reviewed. The common carotid arteries are widely patent bilaterally. The carotid bifurcation is patent bilaterally and there is no significant carotid stenosis. Mild luminal irregularity compatible with atherosclerosis.  High-grade stenosis LEFT vertebral artery origin, mild stenosis RIGHT vertebral artery origin. High-grade stenosis LEFT V4 segment. IMPRESSION: MRI HEAD: No acute intracranial process, specifically no acute ischemia. Stable chronic changes including moderate to severe chronic small vessel ischemic disease. Bilateral occipital encephalomalacia compatible with old PCA territory infarct. MRA HEAD: Mild motion degraded examination. No emergent large vessel occlusion. Stable appearance, including high-grade stenosis LEFT M1 segment ; high-grade stenosis proximal LEFT P2 segment with moderate tandem stenosis bilateral posterior cerebral arteries. High-grade stenosis LEFT V4 segment. MRA NECK: Moderately motion degraded examination. High-grade stenosis LEFT vertebral artery. Patent carotid arteries. Electronically Signed   By: Awilda Metro M.D.   On: 06/21/2015 00:02   Mr Brain Wo Contrast  06/21/2015  CLINICAL DATA:  Near syncopal episode 3 hours ago. Vertigo for 2 days, not improving with meclizine. History of heart block. EXAM: MR HEAD WITHOUT CONTRAST MR CIRCLE OF WILLIS WITHOUT CONTRAST MRA OF THE NECK WITHOUT AND WITH CONTRAST TECHNIQUE: Multiplanar, multiecho pulse sequences of the brain and surrounding structures were obtained according to standard protocol without intravenous contrast.; Angiographic images of the Circle of Willis were obtained using MRA technique without intravenous contrast.; Multiplanar and multiecho pulse sequences of the neck were obtained without and with intravenous contrast. Angiographic images of the neck were obtained using MRA technique without and with intravenous contrast. CONTRAST:  14mL MULTIHANCE GADOBENATE DIMEGLUMINE 529 MG/ML IV SOLN COMPARISON:  CT head June 20, 2015 at 1923 hours and MRI of the brain April 02, 2015 and MRA head April 01, 2015 FINDINGS: MR HEAD FINDINGS No reduced diffusion to suggest acute ischemia. Old micro hemorrhage LEFT pontomedullary  junction. Ventricles and sulci are normal for patient's age. Patchy to confluent supratentorial white matter FLAIR T2 hyperintensities, to lesser extent within the pons. Bilateral occipital lobe encephalomalacia. Tiny bilateral basal ganglia and thalamus lacunar infarcts versus perivascular spaces. No midline shift, mass effect or mass lesions. No abnormal extra-axial fluid collections. Status post bilateral ocular lens implants. Mild paranasal sinus mucosal thickening without air-fluid levels. Trace RIGHT mastoid effusion. No abnormal sellar expansion. No cerebellar tonsillar ectopia. No suspicious calvarial bone marrow signal. MR CIRCLE OF WILLIS FINDINGS Mildly motion degraded examination. Anterior circulation: Normal flow related enhancement of the included cervical, petrous, cavernous and supraclinoid internal carotid arteries. Patent anterior communicating artery. High-grade stenosis LEFT M1 segment. Slow flow LEFT mid to distal segments. Normal appearance the anterior and RIGHT middle cerebral arteries. No large vessel occlusion, aneurysm. Posterior circulation: RIGHT vertebral artery is dominant. Slow flow in intermittent loss of flow related enhancement LEFT V4 segment is similar. Basilar artery is patent, with normal flow related enhancement of the main branch vessels. Flow related enhancement of the posterior cerebral arteries. Tandem moderate stenosis proximal RIGHT posterior cerebral artery. High-grade stenosis proximal LEFT P2 segment, vessel remains patent, subsequent moderate tandem stenosis. Robust RIGHT posterior communicating artery present. No aneurysm. MRA NECK FINDINGS Moderate motion degraded examination. Source images and MIP image were reviewed. The common carotid arteries are widely patent bilaterally. The carotid bifurcation is patent bilaterally and there is no significant carotid stenosis. Mild luminal irregularity compatible with atherosclerosis. High-grade stenosis LEFT vertebral  artery origin, mild stenosis RIGHT vertebral artery origin. High-grade stenosis LEFT V4 segment. IMPRESSION: MRI HEAD: No acute intracranial process,  specifically no acute ischemia. Stable chronic changes including moderate to severe chronic small vessel ischemic disease. Bilateral occipital encephalomalacia compatible with old PCA territory infarct. MRA HEAD: Mild motion degraded examination. No emergent large vessel occlusiAwilda Metroearance, including high-grade stenosis LEFT M1 segment ; high-grade stenosis proximal LEFT P2 segment with moderate tandem stenosis bilateral posterior cerebral arteries. High-grade stenosis LEFT V4 segment. MRA NECK: Moderately motion degraded examination. High-grade stenosis LEFT vertebral artery. Patent carotid arteries. Electronically Signed   By: Courtnay  Bloomer M.D.   On: 06/21/2015 00:02    Microbiology: No results found for this or any previous visit (from the past 240 hour(s)).   Labs: Basic Metabolic Panel:  Recent Labs Lab 06/20/15 2008 06/20/15 2026 06/21/15 0507 06/21/15 1559 06/22/15 0540  NA 144 144 141 140 138  K 4.2 3.8 3.9 4.2 3.6  CL 106 104 102 106 101  CO2 26  --  GLUCOSE 82 76 158* 275* 204*  BUN CREATININE 0.69 0.80 0.79 0.98 0.70  CALCIUM 9.9  --  9.5 9.1 9.5   Liver Function Tests:  Recent Labs Lab 06/20/15 2008 06/21/15 0507 06/21/15 1559 06/22/15 0540  AST ALT 13* ALKPHOS 83 77 81 88  BILITOT 0.3 0.7 0.6 0.5  PROT 7.2 6.6 6.2* 7.0  ALBUMIN 3.6 3.5 3.3* 3.5    Recent Labs Lab 06/21/15 1559  LIPASE 21   No results for input(s): AMMONIA in the last 168 hours. CBC:  Recent Labs Lab 06/20/15 2008 06/20/15 2026 06/21/15 0507 06/22/15 0540  WBC 11.3*  --  11.4* 11.8*  NEUTROABS 5.0  --   --   --   HGB 12.2 13.9 13.3 13.6  HCT 38.2 41.0 40.6 41.3  MCV 81.6  --  81.7 81.1  PLT 286  --  264 272   Cardiac Enzymes:  Recent Labs Lab 06/21/15 1307  06/21/15 1559 06/21/15 2321  TROPONINI <0.03 <0.03 <0.03   BNP: BNP (last 3 results)  Recent Labs  06/21/15 0507  BNP 34.2    ProBNP (last 3 results) No results for input(s): PROBNP in the last 8760 hours.  CBG:  Recent Labs Lab 06/21/15 0841 06/21/15 1216 06/21/15 1627 06/21/15 2225 06/22/15 0637  GLUCAP 163* 214* 223* 243* 183*     Signed:  Marinda Elk MD.  [facility] 06/22/2015, 9:58 AM

## 2015-06-23 ENCOUNTER — Other Ambulatory Visit: Payer: Self-pay | Admitting: Family Medicine

## 2015-06-23 ENCOUNTER — Telehealth: Payer: Self-pay

## 2015-06-23 NOTE — Telephone Encounter (Signed)
First attempt for TCM. Unable to contact pt but left a voicemail to her son phone to return my call

## 2015-06-25 ENCOUNTER — Ambulatory Visit: Payer: Medicare Other | Admitting: Family Medicine

## 2015-06-25 ENCOUNTER — Telehealth: Payer: Self-pay | Admitting: *Deleted

## 2015-06-25 NOTE — Telephone Encounter (Signed)
Transition Care Management Follow-up Telephone Call  How have you been since you were released from the hospital? Having some mid/waste line back pain   Do you understand why you were in the hospital? yes   Do you understand the discharge instrcutions? yes  Items Reviewed:  Medications reviewed: yes  Allergies reviewed: yes  Dietary changes reviewed: yes  Referrals reviewed: yes   Functional Questionnaire:   Activities of Daily Living (ADLs):   She states they are independent in the following: ambulation, bathing and hygiene, feeding, continence, grooming, toileting and dressing States they require assistance with the following: none   Any transportation issues/concerns?: no   Any patient concerns? no   Confirmed importance and date/time of follow-up visits scheduled: yes   Confirmed with patient if condition begins to worsen call PCP or go to the ER.  Patient was given the Call-a-Nurse line [phone]: yes Patient was discharged 06/22/15 Patient was discharged to her home Patient has an appointment with Dr Caryl Never 07/01/15

## 2015-07-01 ENCOUNTER — Ambulatory Visit (INDEPENDENT_AMBULATORY_CARE_PROVIDER_SITE_OTHER): Payer: Medicare Other | Admitting: Family Medicine

## 2015-07-01 VITALS — BP 150/90 | HR 58 | Temp 97.9°F | Ht 61.0 in | Wt 143.0 lb

## 2015-07-01 DIAGNOSIS — IMO0002 Reserved for concepts with insufficient information to code with codable children: Secondary | ICD-10-CM

## 2015-07-01 DIAGNOSIS — I480 Paroxysmal atrial fibrillation: Secondary | ICD-10-CM

## 2015-07-01 DIAGNOSIS — E1165 Type 2 diabetes mellitus with hyperglycemia: Secondary | ICD-10-CM

## 2015-07-01 DIAGNOSIS — R42 Dizziness and giddiness: Secondary | ICD-10-CM | POA: Diagnosis not present

## 2015-07-01 DIAGNOSIS — E1142 Type 2 diabetes mellitus with diabetic polyneuropathy: Secondary | ICD-10-CM

## 2015-07-01 DIAGNOSIS — E1169 Type 2 diabetes mellitus with other specified complication: Secondary | ICD-10-CM

## 2015-07-01 DIAGNOSIS — R296 Repeated falls: Secondary | ICD-10-CM | POA: Diagnosis not present

## 2015-07-01 DIAGNOSIS — Z9181 History of falling: Secondary | ICD-10-CM | POA: Insufficient documentation

## 2015-07-01 DIAGNOSIS — Z794 Long term (current) use of insulin: Secondary | ICD-10-CM

## 2015-07-01 MED ORDER — DULOXETINE HCL 30 MG PO CPEP: ORAL_CAPSULE | ORAL | Status: DC

## 2015-07-01 NOTE — Progress Notes (Signed)
Subjective:    Patient ID: Casey Harrell, female    DOB: [birth year], 81 y.o.   MRN: [id number]  HPI Patient seen for hospital follow-up Multiple chronic problems including history of CVA, asthma, hypertension, chronic kidney disease, depression, type 2 diabetes which has been poorly controlled with peripheral neuropathy, dyslipidemia, GERD, hypothyroidism, obesity, history of partial small bowel obstruction, peripheral vascular disease. She had recent stroke back in November. She developed dizziness and was admitted on 06/20/2015 with what sounded like more of a vestibular issue with vertigo. She had repeat CT of the head as well as MRI and MR angiogram with no acute findings. No evidence for recurrent stroke. Neurology consult. Question of benign peripheral positional vertigo.  Symptoms resolved at this time. Her Diovan was switched to Avapro.   Blood sugars have been poorly controlled. History of poor compliance. Currently takes Levemir 40 units twice daily and has been taking Humalog 9-14 units twice daily with breakfast and supper. We have discussed previously starting sliding scale and going to 3 times a day short acting insulin.  Diabetic peripheral neuropathy. She's on high-dose lyrica and still has 8 out of 10 pain frequently. Previously took Cymbalta but for some reason did not get this refilled. No history of intolerance. She thinks her pain was better controlled on Cymbalta-in combination with the Lyrica..  Past Medical History  Diagnosis Date  . DIABETES-TYPE 2 07/15/2008  . DIABETIC PERIPHERAL NEUROPATHY 09/02/2009  . HYPERTENSION 07/15/2008  . ASTHMA 07/15/2008  . GERD 07/15/2008  . PVD (peripheral vascular disease) (HCC) 06/18/09    RCE  . Normal cardiac stress test     low risk Nuc 2009, Feb 2011  . COPD (chronic obstructive pulmonary disease) (HCC)   . Vertigo   . Stroke (HCC)   . Paroxysmal atrial fibrillation [facility])    Past Surgical History  Procedure Laterality Date  .  Appendectomy  1971  . Abdominal hysterectomy  1971  . Tonsillectomy    . Tubal ligation    . Cesarean section    . Flexible sigmoidoscopy      diverticulitis  . Laparotomy      X 3 with adhesions lysis  . Foot surgery      right foot X 2  . Carotid endarterectomy  06/18/09    RCE  . Colectomy and colostomy      reports that she has never smoked. She has never used smokeless tobacco. She reports that she does not drink alcohol. Her drug history is not on file. family history includes Arthritis in her mother; Asthma in her sister; Cancer in her paternal grandfather; Cancer (age of onset: 45) in her brother; Celiac disease in her sister; Diabetes in her mother; Heart disease in her brother, father, mother, sister, and sister; Stroke in her maternal grandfather. Allergies  Allergen Reactions  . Doxycycline Hyclate Nausea And Vomiting  . Morphine Sulfate Other (See Comments)    REACTION: hallucinations  . Iohexol Hives     Code: HIVES, Desc: pt. states she breaks out in hives 06/15/08   . Moxifloxacin Other (See Comments)    REACTION: unknown  ]    Review of Systems  Constitutional: Negative for fatigue and unexpected weight change.  Eyes: Negative for visual disturbance.  Respiratory: Negative for cough, chest tightness, shortness of breath and wheezing.   Cardiovascular: Negative for chest pain, palpitations and leg swelling.  Gastrointestinal: Negative for nausea and vomiting.  Endocrine: Negative for polydipsia and polyuria.  Genitourinary: Negative for  dysuria.  Musculoskeletal: Positive for arthralgias.  Neurological: Positive for numbness. Negative for dizziness, seizures, syncope, weakness, light-headedness and headaches.  Psychiatric/Behavioral: Negative for confusion.       Objective:   Physical Exam  Constitutional: She is oriented to person, place, and time. She appears well-developed and well-nourished.  HENT:  Mouth/Throat: Oropharynx is clear and moist.    Eyes: Pupils are equal, round, and reactive to light.  Neck: Neck supple. No JVD present. No thyromegaly present.  Cardiovascular: Normal rate and regular rhythm.  Exam reveals no gallop.   Pulmonary/Chest: Effort normal and breath sounds normal. No respiratory distress. She has no wheezes. She has no rales.  Musculoskeletal: She exhibits no edema.  Neurological: She is alert and oriented to person, place, and time. No cranial nerve deficit.          Assessment & Plan:  #1 recent vertigo currently resolved. Consider vestibular rehabilitation if recurs  #2 high risk of falls. Handicap sticker filled out. Ambulate with assistance at all times  #3 type 2 diabetes with history of peripheral neuropathy and poor control. Sliding scale with Humalog given and increased sliding scale use to 3 times daily with meals. Continue Levemir 40 units subcutaneous twice daily. Recheck A1c at follow-up in one month  #4 diabetic peripheral neuropathy pain poorly controlled.  Start back Cymbalta 30 mg once daily for 2 weeks then titrate to 60 mg daily.

## 2015-07-01 NOTE — Patient Instructions (Addendum)
Your health care provider has decided you need to take insulin regularly. You have been given a correction scale (also called a sliding scale) in case you need extra insulin when your blood sugar is too high (hyperglycemia). The following instructions will assist you in how to use that correction scale.  WHAT IS A CORRECTION SCALE?  When you check your blood sugar, sometimes it will be higher than your health care provider has told you it should be. You may need an extra dose of insulin to bring your blood sugar to the recommended level (also known as your goal, target, or normal level). The correction scale is prescribed by your health care provider based on your specific needs.  Your correction scale has two parts:   The first shows you a blood sugar range.   The second part tells you how much extra insulin to give yourself if your blood sugar falls within this range. You will not need an extra dose of insulin if your blood glucose is in the desired range. You should simply give yourself the normal amount of insulin that your health care provider has ordered for you.  WHY IS IT IMPORTANT TO KEEP YOUR BLOOD SUGAR LEVELS AT YOUR DESIRED LEVEL?  Keeping your blood sugar at the desired level helps to prevent long-term complications of diabetes, such as eye disease, kidney failure, nerve damage, and other serious complications. WHAT TYPE OF INSULIN WILL YOU USE?  To help bring down blood sugar levels that are too high, your health care provider will prescribe a short-acting or a rapid-acting insulin. An example of a short-acting insulin would be regular insulin. Remember, you may also have a longer-acting insulin prescribed for you.  WHAT DO YOU NEED TO DO?   Check your blood sugar with your home blood glucose meter as recommended by your health care provider.   Using your correction scale, find the range that your blood sugar lies in.   Look for the units of insulin that match that blood  sugar range. Give yourself the dose of correction insulin your health care provider has prescribed. Always make sure you are using the right type of insulin.   Prior to the injection, make sure you have food available that you can eat in the next 15-30 minutes.   If your correction insulin is rapid acting, start eating your meal within 15 minutes after you have given yourself the insulin injection. If you wait longer than 15 minutes to eat, your blood sugar might get too low.   If your correction insulin is short acting(regular), start eating your meal within 30 minutes after you have given yourself the insulin injection. If you wait longer than 30 minutes to eat, your blood sugar might get too low. Symptoms of low blood sugar (hypoglycemia) may include feeling shaky or weak, sweating, feeling confused, difficulty seeing, agitation, crankiness, or numbness of the lips or tongue. Check your blood sugar immediately and treat your results as directed by your health care provider.   Keep a log of your blood sugar results with the time you took the test and the amount of insulin that you injected. This information will help your health care provider manage your medicines.   Note on your log anything that may affect your blood sugar level, such as:   Changes in normal exercise or activity.   Changes in your normal schedule, such as staying up late, going on vacation, changing your diet, or holidays.   New medicines. This   includes prescription and over-the-counter medicines. Some medicines may cause high blood sugar.   Sickness, stress, or anxiety.   Changes in the time you took your medicine.   Changes in your meals, such as skipping a meal, having a late meal, or dining out.   Eating things that may affect blood glucose, such as snacks, meal portions that are larger than normal, drinks with sugar, or eating less than usual.   Ask your health care provider any questions you  have.  Be aware of "stacking" your insulin doses. This happens when you correct a high blood sugar level by giving yourself extra insulin too soon after a previous correction dose or mealtime dose. You may then have too much insulin still active in your body and may be at risk for hypoglycemia. WHY DO YOU NEED A CORRECTION SCALE IF YOU HAVE NEVER BEEN DIAGNOSED WITH DIABETES?   Keeping your blood glucose in the target range is important for your overall health.   You may have been prescribed medicines that cause your blood glucose to be higher than normal. WHEN SHOULD YOU SEEK MEDICAL CARE? Contact your health care provider if:   You have experienced hypoglycemia that you are unable to treat with your usual routine.   You have a high blood sugar level that is not coming down with the correction dose.  Your blood sugar is often too low or does not come up even if you eat a fast-acting carbohydrate. Someone who lives with you should seek immediate medical care if you become unresponsive.   This information is not intended to replace advice given to you by your health care provider. Make sure you discuss any questions you have with your health care provider.   Document Released: 09/15/2010 Document Revised: 12/25/2012 Document Reviewed: 10/04/2012 Elsevier Interactive Patient Education 2016 [employer]  Blood sugar level:                                               Give this number units of Humalog:  70-130                                                                   0 131-180                                                                 4 181-240                                                                 8 241-300  10 301-350                                                                12 351-400                                                                16 > 400                                                                     20                                                                                                           

## 2015-07-01 NOTE — Progress Notes (Signed)
Pre visit review using our clinic review tool, if applicable. No additional management support is needed unless otherwise documented below in the visit note. 

## 2015-07-07 ENCOUNTER — Ambulatory Visit: Payer: Medicare Other | Admitting: Family Medicine

## 2015-07-20 ENCOUNTER — Other Ambulatory Visit: Payer: Self-pay | Admitting: Family Medicine

## 2015-07-29 ENCOUNTER — Ambulatory Visit (INDEPENDENT_AMBULATORY_CARE_PROVIDER_SITE_OTHER): Payer: Medicare Other | Admitting: Family Medicine

## 2015-07-29 VITALS — BP 142/70 | HR 67 | Temp 98.0°F | Ht 61.0 in | Wt 143.7 lb

## 2015-07-29 DIAGNOSIS — E1140 Type 2 diabetes mellitus with diabetic neuropathy, unspecified: Secondary | ICD-10-CM

## 2015-07-29 DIAGNOSIS — I10 Essential (primary) hypertension: Secondary | ICD-10-CM

## 2015-07-29 DIAGNOSIS — I480 Paroxysmal atrial fibrillation: Secondary | ICD-10-CM | POA: Diagnosis not present

## 2015-07-29 DIAGNOSIS — E1159 Type 2 diabetes mellitus with other circulatory complications: Secondary | ICD-10-CM | POA: Diagnosis not present

## 2015-07-29 DIAGNOSIS — IMO0002 Reserved for concepts with insufficient information to code with codable children: Secondary | ICD-10-CM

## 2015-07-29 DIAGNOSIS — E1165 Type 2 diabetes mellitus with hyperglycemia: Secondary | ICD-10-CM

## 2015-07-29 LAB — POCT GLYCOSYLATED HEMOGLOBIN (HGB A1C): Hemoglobin A1C: 7.4

## 2015-07-29 NOTE — Progress Notes (Signed)
Pre visit review using our clinic review tool, if applicable. No additional management support is needed unless otherwise documented below in the visit note. 

## 2015-07-29 NOTE — Patient Instructions (Signed)
Your A1C is 7.4% which is near our goal of < 7.0

## 2015-07-29 NOTE — Progress Notes (Signed)
Subjective:    Patient ID: Casey Harrell, female    DOB: 1935/02/27, 81 y.o.   MRN: [id number]  HPI  patient seen for medical follow-up   She had recent CVA which was likely embolic. Recent event monitor revealed paroxysmal atrial fibrillation. She was placed on Eliquis and taken off aspirin and Plavix. Currently taking Eliquis 5 mg twice daily. Her  CHA2DSVASC2 score was 7 putting her per year stroke rate around 10.8%.   hypertension has been stable with consistent readings below 150 systolic. No dizziness.   Type 2 diabetes. History of poor control. History of poor compliance. We recently switched her to sliding scale Humalog with meals and she remains on 30 units of long-acting insulin twice daily. No recent hypoglycemia. CBGs have improved by home readings.   History of peripheral neuropathy. We added back Cymbalta last visit. Her pain is much improved. Current pain levels round 3-4 out of 10. Not interfering with sleep as much.  Past Medical History  Diagnosis Date  . DIABETES-TYPE 2 07/15/2008  . DIABETIC PERIPHERAL NEUROPATHY 09/02/2009  . HYPERTENSION 07/15/2008  . ASTHMA 07/15/2008  . GERD 07/15/2008  . PVD (peripheral vascular disease) (HCC) 06/18/09    RCE  . Normal cardiac stress test     low risk Nuc 2009, Feb 2011  . COPD (chronic obstructive pulmonary disease) (HCC)   . Vertigo   . Stroke (HCC)   . Paroxysmal atrial fibrillation [facility])    Past Surgical History  Procedure Laterality Date  . Appendectomy  1971  . Abdominal hysterectomy  1971  . Tonsillectomy    . Tubal ligation    . Cesarean section    . Flexible sigmoidoscopy      diverticulitis  . Laparotomy      X 3 with adhesions lysis  . Foot surgery      right foot X 2  . Carotid endarterectomy  06/18/09    RCE  . Colectomy and colostomy      reports that she has never smoked. She has never used smokeless tobacco. She reports that she does not drink alcohol. Her drug history is not on file. family history  includes Arthritis in her mother; Asthma in her sister; Cancer in her paternal grandfather; Cancer (age of onset: 77) in her brother; Celiac disease in her sister; Diabetes in her mother; Heart disease in her brother, father, mother, sister, and sister; Stroke in her maternal grandfather. Allergies  Allergen Reactions  . Doxycycline Hyclate Nausea And Vomiting  . Morphine Sulfate Other (See Comments)    REACTION: hallucinations  . Iohexol Hives     Code: HIVES, Desc: pt. states she breaks out in hives 06/15/08   . Moxifloxacin Other (See Comments)    REACTION: unknown      Review of Systems  Constitutional: Positive for fatigue. Negative for fever, chills and unexpected weight change.  Eyes: Negative for visual disturbance.  Respiratory: Negative for cough, chest tightness, shortness of breath and wheezing.   Cardiovascular: Negative for chest pain, palpitations and leg swelling.  Endocrine: Negative for polydipsia and polyuria.  Genitourinary: Negative for dysuria.  Neurological: Negative for dizziness, seizures, syncope, weakness, light-headedness and headaches.       Objective:   Physical Exam  Constitutional: She appears well-developed and well-nourished.  Eyes: Pupils are equal, round, and reactive to light.  Neck: Neck supple. No JVD present. No thyromegaly present.  Cardiovascular: Normal rate and regular rhythm.  Exam reveals no gallop.   Pulmonary/Chest: Effort  normal and breath sounds normal. No respiratory distress. She has no wheezes. She has no rales.  Musculoskeletal: She exhibits no edema.  Neurological: She is alert.          Assessment & Plan:   #1 type 2 diabetes. History of poor control. Repeat A1c today 7.4% which is the best it has been in quite some time. Consent continue with current insulin regimen. Reassess 3 months   #2 history of CVA. Paroxysmal atrial fibrillation by recent event monitor. Patient now on Eliquis. Samples provided.   #3  hyperlipidemia treated with Lipitor   #4 diabetic peripheral neuropathy. Improved with addition of Cymbalta.

## 2015-08-06 ENCOUNTER — Other Ambulatory Visit: Payer: Self-pay | Admitting: Family Medicine

## 2015-08-06 MED ORDER — IRBESARTAN 75 MG PO TABS: 75.0000 mg | ORAL_TABLET | Freq: Every day | ORAL | Status: DC

## 2015-08-24 ENCOUNTER — Encounter: Payer: Self-pay | Admitting: Neurology

## 2015-08-24 ENCOUNTER — Ambulatory Visit (INDEPENDENT_AMBULATORY_CARE_PROVIDER_SITE_OTHER): Payer: Medicare Other | Admitting: Neurology

## 2015-08-24 VITALS — BP 144/78 | HR 67 | Ht 61.0 in | Wt 140.2 lb

## 2015-08-24 DIAGNOSIS — E785 Hyperlipidemia, unspecified: Secondary | ICD-10-CM | POA: Diagnosis not present

## 2015-08-24 DIAGNOSIS — I480 Paroxysmal atrial fibrillation: Secondary | ICD-10-CM | POA: Diagnosis not present

## 2015-08-24 DIAGNOSIS — H8110 Benign paroxysmal vertigo, unspecified ear: Secondary | ICD-10-CM | POA: Diagnosis not present

## 2015-08-24 DIAGNOSIS — I63512 Cerebral infarction due to unspecified occlusion or stenosis of left middle cerebral artery: Secondary | ICD-10-CM | POA: Diagnosis not present

## 2015-08-24 DIAGNOSIS — E1159 Type 2 diabetes mellitus with other circulatory complications: Secondary | ICD-10-CM | POA: Diagnosis not present

## 2015-08-24 DIAGNOSIS — I10 Essential (primary) hypertension: Secondary | ICD-10-CM

## 2015-08-24 DIAGNOSIS — I6350 Cerebral infarction due to unspecified occlusion or stenosis of unspecified cerebral artery: Secondary | ICD-10-CM | POA: Diagnosis not present

## 2015-08-24 NOTE — Patient Instructions (Signed)
-   continue eliquis and lipitor for stroke prevention - check glucose at home and record - check BP at home, goal BP 120-150, avoid low BP - Follow up with your primary care physician for stroke risk factor modification. Recommend maintain blood pressure goal <130/80, diabetes with hemoglobin A1c goal below 6.5% and lipids with LDL cholesterol goal below 70 mg/dL.  - do the maneuver 3-4 times a day for one month if you have recurrent vertigo - follow up in 6 months.

## 2015-08-25 DIAGNOSIS — E785 Hyperlipidemia, unspecified: Secondary | ICD-10-CM | POA: Insufficient documentation

## 2015-08-25 DIAGNOSIS — H8110 Benign paroxysmal vertigo, unspecified ear: Secondary | ICD-10-CM | POA: Insufficient documentation

## 2015-08-25 NOTE — Progress Notes (Signed)
STROKE NEUROLOGY FOLLOW UP NOTE  NAME: Casey Harrell DOB: 1935/01/08  REASON FOR VISIT: stroke follow up HISTORY FROM: pt and chart  Today we had the pleasure of seeing Casey Harrell in follow-up at our [facility]. Pt was accompanied by granddaughter.   History Summary Casey Harrell is a 81 y.o. female with history of DM, hypertensilon, asthma, PVD, COPD was admitted on 03/31/15 for agitation and increased confusion following diarrhea x 2 days at home. MRI showed small patchy left MCA infarcts with remote bilateral occipital infarcts, embolic pattern. MRA showed segmental L M1 stenosis as well as focal severe stenoses L V4 segment, proximal R SCA, and mid L P2 segment. CUS and TTE unremarkable. LDL 76 and A1C 9.0. EEG no seizure. Mental status improved overnight, considering encephalopathy due to multiple factors, such as stroke, dehydration, diarrhea and hypertensive urgency. On 04/02/15 pt was sitting after PT/OT session, developed AMS, aphasia, right UE and LE weakness. Found to have low BP at 80s to 90s. Lying her flat in bed, gave IV bolus, albumin 4 doses. Stat CT no bleeding and MRI showed patchy extension of left MCA infarcts, consistent with low BP in the setting of left M1 stenosis. Her aphasia resolved but she still has right hemiparesis although with some improvement. She was put on dual antiplatelet and lipitor, recommended 30 day monitoring. She was discharged to CIR after stabilization.  05/07/2015 admission - to [facility] for abdominal pain associate with nausea and vomiting. CT scan of abdomen and pelvis revealed partial small bowel obstruction. She was put on NPO and NG tube. Patient showed improvement over the subsequent days with resolution of small bowel obstruction. Her diet was slowly advanced. Follow-up imaging studies included abdominal x-ray on 05/11/2015 that did not show air-fluid levels or dilatation suggestive of obstruction. NG tube was discontinued. By 05/13/2015 she was  tolerating by mouth intake. She was discharged to home in stable condition.  05/20/15 follow up- the patient has been doing significantly better. She stayed in CIR for 2 weeks and discharged home with improvement of right UE and LE strength. She has HH PT/OT/speech and her right LE strength close to baseline and still has mild weakness at right UE. Able to walk without device. No speech difficulties. BP today 157/79. Not have 30 day monitoring yet.  Interval History During the interval time, pt has been doing well from stroke standpoint. 30 day cardiac monitoring showed pAfib, she followed with Dr. Allyson Sabal and her DAPT was changed to eliquis. She is on eliquis and lipitor now without side effect. However, she was admitted on 06/20/15 for vertigo and MRI negative for stroke. Diagnosed with BPPV. She was discharged with home PT and received maneuver for BPPV and her symptoms resolved. She is walking much better and no need any cane or walker now. Glucose ranging from 100-170 and BP today 144/78. She has appointment with PCP in 2 months.  REVIEW OF SYSTEMS: Full 14 system review of systems performed and notable only for those listed below and in HPI above, all others are negative:  Constitutional:   Cardiovascular:  Ear/Nose/Throat:   Skin:  Eyes:   Respiratory:   Gastroitestinal:   Genitourinary:  Hematology/Lymphatic:   Endocrine:  Musculoskeletal:   Allergy/Immunology:   Neurological:   Psychiatric:  Sleep:   The following represents the patient's updated allergies and side effects list: Allergies  Allergen Reactions  . Doxycycline Hyclate Nausea And Vomiting  . Morphine Sulfate Other (See Comments)  REACTION: hallucinations  . Iohexol Hives     Code: HIVES, Desc: pt. states she breaks out in hives 06/15/08   . Moxifloxacin Other (See Comments)    REACTION: unknown    The neurologically relevant items on the patient's problem list were reviewed on today's visit.  Neurologic  Examination  A problem focused neurological exam (12 or more points of the single system neurologic examination, vital signs counts as 1 point, cranial nerves count for 8 points) was performed.  Blood pressure 144/78, pulse 67, height  (1.549 m), weight 140 lb 3.2 oz (63.594 kg).  General - Well nourished, well developed, in no apparent distress.  Ophthalmologic - Fundi not visualized due to eye movement.  Cardiovascular - Regular rate and rhythm.  Mental Status -  Level of arousal and orientation to time, place, and person were intact. Language including expression, naming, repetition, comprehension was assessed and found intact. Fund of Knowledge was assessed and was intact.  Cranial Nerves II - XII - II - Visual field intact OU. III, IV, VI - Extraocular movements intact. V - Facial sensation intact bilaterally. VII - Facial movement intact bilaterally. VIII - Hearing & vestibular intact bilaterally. X - Palate elevates symmetrically. XI - Chin turning & shoulder shrug intact bilaterally. XII - Tongue protrusion intact.  Motor Strength - The patient's strength was normal in all extremities.  Bulk was normal and fasciculations were absent.   Motor Tone - Muscle tone was assessed at the neck and appendages and was normal.  Reflexes - The patient's reflexes were 1+ in all extremities and she had no pathological reflexes.  Sensory - Light touch, temperature/pinprick were assessed and were normal.    Coordination - The patient without dysmetria or ataxia bilaterally.  Tremor was absent.  Gait and Station - walk without device, slow but steady.  Data reviewed: I personally reviewed the images and agree with the radiology interpretations.  Dg Chest 2 View 03/31/2015 Cardiomegaly without acute process. No interval change.   Ct Head Wo Contrast 03/31/2015 Findings concerning for recent/ possibly acute infarct in the medial posterior right occipital lobe. Prior infarct  posterior left occipital lobe. There is atrophy with patchy small vessel disease in the periventricular white matter bilaterally. No hemorrhage or mass effect. Sinusitis with air-fluid level in the right maxillary antrum.   MRI HEAD  04/01/2015 IMPRESSION: 1. Small patchy acute ischemic infarcts involving the left periatrial white matter and left temporoparietal region. 2. Remote bilateral occipital infarcts, left greater than right, with scattered remote lacunar infarcts as above. 3. Cerebral atrophy with moderate chronic small vessel ischemic disease.  MRA HEAD  04/01/2015 IMPRESSION: 1. No large or proximal arterial branch occlusion. 2. Focal high-grade stenosis within the proximal left M1 segment. 3. Extensive atheromatous throughout the posterior circulation, with focal severe stenoses within the left V4 segment, proximal right SCA, and mid left P2 segment.  CT head without contrast 04/02/2015 Bilateral remote occipital lobe infarcts with chronic involutional change. Known small patchy acute infarcts left hemisphere seen on MRI performed 04/01/2015 not well appreciated on CT.  MRI HEAD without contrast 04/02/2015 1. Interval extension in the recently identified left periatrial and temporoparietal infarcts, with multiple scattered new ischemic infarcts involving the left temporoparietal region and left occipital lobe. This most likely resulted from recent hypotensive episode in the setting of known high-grade stenoses in the left M1 and/or P2 segments as seen on recent MRA. 2. Otherwise stable appearance of the brain.  EEG - This EEG  is abnormal with mild generalized nonspecific continuous slowing of cerebral activity. No evidence of an epileptic disorder was demonstrated.  CUS - Bilateral: 1-39% ICA stenosis. Vertebral artery flow is antegrade.  TTE - - Left ventricle: The cavity size was normal. Systolic function was normal. The estimated ejection fraction was in the range of  60% to 65%. Wall motion was normal; there were no regional wall motion abnormalities. Features are consistent with a pseudonormal left ventricular filling pattern, with concomitant abnormal relaxation and increased filling pressure (grade 2 diastolic dysfunction). Doppler parameters are consistent with high ventricular filling pressure. - Aortic valve: Trileaflet; mildly thickened, mildly calcified leaflets. - Mitral valve: Calcified annulus. There was trivial regurgitation. - Atrial septum: There was increased thickness of the septum, consistent with lipomatous hypertrophy. - Pulmonic valve: There was trivial regurgitation.  MRI brain/MRA head/MRA neck 06/21/15 MRI HEAD: No acute intracranial process, specifically no acute ischemia. Stable chronic changes including moderate to severe chronic small vessel ischemic disease. Bilateral occipital encephalomalacia compatible with old PCA territory infarct. MRA HEAD: Mild motion degraded examination. No emergent large vessel occlusion. Stable appearance, including high-grade stenosis LEFT M1 segment; high-grade stenosis proximal LEFT P2 segment with moderate tandem stenosis bilateral posterior cerebral arteries. High-grade stenosis LEFT V4 segment. MRA NECK: Moderately motion degraded examination. High-grade stenosis LEFT vertebral artery. Patent carotid arteries.  30 day cardiac event monitoring - episodes of pAfib   Component     Latest Ref Rng 12/25/2014 03/25/2015  Total Bilirubin     0.2 - 1.2 mg/dL  0.4  Bilirubin, Direct     0.0 - 0.3 mg/dL  0.1  Alkaline Phosphatase     39 - 117 U/L  87  AST     0 - 37 U/L  29  ALT     0 - 35 U/L  22  Total Protein     6.0 - 8.3 g/dL  7.2  Albumin     3.5 - 5.2 g/dL  4.0  Cholesterol     0 - 200 mg/dL  93  Triglycerides     0.0 - 149.0 mg/dL  161.0284.0 (H)  HDL Cholesterol     >39.00 mg/dL  96.0420.10 (L)  VLDL     0.0 - 40.0 mg/dL  54.056.8 (H)  Total CHOL/HDL Ratio       5   NonHDL       72.56  TSH     0.35 - 4.50 uIU/mL 2.88 1.78  Hemoglobin A1C     4.6 - 6.5 % 8.7 (H) 9.0 (H)  Direct LDL       47.0    Assessment: As you may recall, she is a 81 y.o. Caucasian female with PMH of DM, HTN, asthma, PVD, COPD was admitted on 03/31/15 for AMS following diarrhea at home. MRI showed small patchy left MCA infarcts with remote bilateral occipital infarcts, embolic pattern but could be due to multiple intracranial stenosis in MRA which showed segmental L M1 stenosis as well as focal severe stenoses L V4 segment, proximal R SCA, and mid L P2 segment. CUS and TTE unremarkable. LDL 76 and A1C 9.0. EEG no seizure. Mental status improved overnight, considering encephalopathy due to multiple factors, such as stroke, dehydration, diarrhea and hypertensive urgency. On 04/02/15 pt developed orthostatic hypotension with neuro changes, MRI showed patchy extension of left MCA infarcts, consistent with low BP in the setting of left M1 stenosis. Received fluid resuscitation, her aphasia resolved but she still has right hemiparesis. She was put  on dual antiplatelet and lipitor, recommended 30 day monitoring. She was discharged to CIR after stabilization. During the interval time, 30 day cardiac event monitoring showed afib. She was put on eliquis. She was also admitted for BPPV in 06/2015 and resolved after maneuver training. MRI and MRA at that time showed no acute stroke and left M1, b/l PCA stenosis stable. She had dramatic recovery from stroke and no residue deficit.  Plan:  - continue eliquis and lipitor for stroke prevention - check glucose at home and record - check BP at home, goal BP 120-150, avoid low BP due to left M1 and b/l PCA high grade stenosis - Follow up with your primary care physician for stroke risk factor modification. Recommend maintain blood pressure goal <130/80, diabetes with hemoglobin A1c goal below 6.5% and lipids with LDL cholesterol goal below 70 mg/dL.  - BPPV  maneuver 3-4 times a day for one month if recurrent vertigo - follow up in 6 months.  I spent more than 25 minutes of face to face time with the patient. Greater than 50% of time was spent in counseling and coordination of care. We have discussed about BPPV management, medication compliance and risk factor control as well as avoid low BP.   No orders of the defined types were placed in this encounter.    No orders of the defined types were placed in this encounter.    Patient Instructions  - continue eliquis and lipitor for stroke prevention - check glucose at home and record - check BP at home, goal BP 120-150, avoid low BP - Follow up with your primary care physician for stroke risk factor modification. Recommend maintain blood pressure goal <130/80, diabetes with hemoglobin A1c goal below 6.5% and lipids with LDL cholesterol goal below 70 mg/dL.  - do the maneuver 3-4 times a day for one month if you have recurrent vertigo - follow up in 6 months.    Marvel Plan, MD PhD [facility] [address], [address] [place], [place] [address] [phone]

## 2015-09-09 LAB — HM DIABETES EYE EXAM

## 2015-09-14 ENCOUNTER — Other Ambulatory Visit: Payer: Self-pay | Admitting: Family Medicine

## 2015-09-14 ENCOUNTER — Encounter: Payer: Self-pay | Admitting: Family Medicine

## 2015-10-06 ENCOUNTER — Telehealth: Payer: Self-pay

## 2015-10-06 NOTE — Telephone Encounter (Signed)
Patient is on the list for [employer] 2017 and may be a good candidate for an AWV in 2017.  Pt does have a follow up scheduled for June.

## 2015-10-13 ENCOUNTER — Other Ambulatory Visit: Payer: Self-pay | Admitting: Family Medicine

## 2015-10-14 NOTE — Telephone Encounter (Signed)
Call to schedule AWV and no answer, Will try back at a later time

## 2015-11-01 ENCOUNTER — Ambulatory Visit: Payer: Medicare Other | Admitting: Family Medicine

## 2015-11-01 DIAGNOSIS — Z0289 Encounter for other administrative examinations: Secondary | ICD-10-CM

## 2015-11-02 ENCOUNTER — Other Ambulatory Visit: Payer: Self-pay | Admitting: Family Medicine

## 2015-11-02 NOTE — Telephone Encounter (Signed)
Refill for 6 months. 

## 2015-11-03 NOTE — Telephone Encounter (Signed)
Done

## 2015-11-23 ENCOUNTER — Ambulatory Visit (INDEPENDENT_AMBULATORY_CARE_PROVIDER_SITE_OTHER): Payer: Medicare Other | Admitting: Family Medicine

## 2015-11-23 DIAGNOSIS — E1165 Type 2 diabetes mellitus with hyperglycemia: Secondary | ICD-10-CM | POA: Diagnosis not present

## 2015-11-23 DIAGNOSIS — E1169 Type 2 diabetes mellitus with other specified complication: Secondary | ICD-10-CM

## 2015-11-23 DIAGNOSIS — R5383 Other fatigue: Secondary | ICD-10-CM | POA: Diagnosis not present

## 2015-11-23 DIAGNOSIS — I10 Essential (primary) hypertension: Secondary | ICD-10-CM | POA: Diagnosis not present

## 2015-11-23 DIAGNOSIS — E039 Hypothyroidism, unspecified: Secondary | ICD-10-CM

## 2015-11-23 DIAGNOSIS — IMO0002 Reserved for concepts with insufficient information to code with codable children: Secondary | ICD-10-CM

## 2015-11-23 DIAGNOSIS — Z794 Long term (current) use of insulin: Secondary | ICD-10-CM | POA: Diagnosis not present

## 2015-11-23 LAB — POCT GLYCOSYLATED HEMOGLOBIN (HGB A1C): Hemoglobin A1C: 7.7

## 2015-11-23 NOTE — Progress Notes (Signed)
Subjective:     Patient ID: Casey Harrell, female   DOB: August 15, 1934, 81 y.o.   MRN: [id number]  HPI Patient seen with chief complaint of "fatigue " She apparently ran out of her thyroid medication and was off for over 2 weeks and finally got this refilled couple weeks ago and since she has been back on replacement has noticed some improvement in energy. Sleeping okay. No mood disorder. No recent chest pains. No focal weakness.  Type 2 diabetes. History of poor control and poor compliance. Last A1c 8.9%. Takes Levemir 40 units twice daily and sliding scale Humalog 3 times daily with meals. No recent hypoglycemia.  Hypertension on Avapro and Coreg.  BP stable.  No chest pains.  Past Medical History  Diagnosis Date  . DIABETES-TYPE 2 07/15/2008  . DIABETIC PERIPHERAL NEUROPATHY 09/02/2009  . HYPERTENSION 07/15/2008  . ASTHMA 07/15/2008  . GERD 07/15/2008  . PVD (peripheral vascular disease) (HCC) 06/18/09    RCE  . Normal cardiac stress test     low risk Nuc 2009, Feb 2011  . COPD (chronic obstructive pulmonary disease) (HCC)   . Vertigo   . Stroke (HCC)   . Paroxysmal atrial fibrillation [facility](HCC)    Past Surgical History  Procedure Laterality Date  . Appendectomy  1971  . Abdominal hysterectomy  1971  . Tonsillectomy    . Tubal ligation    . Cesarean section    . Flexible sigmoidoscopy      diverticulitis  . Laparotomy      X 3 with adhesions lysis  . Foot surgery      right foot X 2  . Carotid endarterectomy  06/18/09    RCE  . Colectomy and colostomy      reports that she has never smoked. She has never used smokeless tobacco. She reports that she does not drink alcohol. Her drug history is not on file. family history includes Arthritis in her mother; Asthma in her sister; Cancer in her paternal grandfather; Cancer (age of onset: 3860) in her brother; Celiac disease in her sister; Diabetes in her mother; Heart disease in her brother, father, mother, sister, and sister; Stroke in her  maternal grandfather. Allergies  Allergen Reactions  . Doxycycline Hyclate Nausea And Vomiting  . Morphine Sulfate Other (See Comments)    REACTION: hallucinations  . Iohexol Hives     Code: HIVES, Desc: pt. states she breaks out in hives 06/15/08   . Moxifloxacin Other (See Comments)    REACTION: unknown     Review of Systems  Constitutional: Positive for fatigue. Negative for fever, chills, appetite change and unexpected weight change.  Eyes: Negative for visual disturbance.  Respiratory: Negative for cough, chest tightness, shortness of breath and wheezing.   Cardiovascular: Negative for chest pain, palpitations and leg swelling.  Gastrointestinal: Negative for abdominal pain.  Endocrine: Negative for polydipsia and polyuria.  Genitourinary: Negative for dysuria.  Neurological: Negative for dizziness, seizures, syncope, weakness, light-headedness and headaches.       Objective:   Physical Exam  Constitutional: She appears well-developed and well-nourished. No distress.  Neck: Neck supple. No thyromegaly present.  Cardiovascular: Normal rate and regular rhythm.   Pulmonary/Chest: Effort normal and breath sounds normal. No respiratory distress. She has no wheezes. She has no rales.  Musculoskeletal: She exhibits no edema.  Lymphadenopathy:    She has no cervical adenopathy.       Assessment:     #1 fatigue. Probably related to running out of  thyroid medication predominantly. She is improved after starting back thyroid medication  #2 type 2 diabetes. History of poor control and history of poor compliance  #3 hypertension. Stable  #Hypothyroidism    Plan:    - -Repeat hemoglobin A1c -Reviewed at length with patient and granddaughter her insulin regimen and dietary factors and affect blood sugar -Plan three-month follow-up and recheck TSH along with other labs that point     [name] MD [facility]

## 2015-11-23 NOTE — Progress Notes (Signed)
Pre visit review using our clinic review tool, if applicable. No additional management support is needed unless otherwise documented below in the visit note. 

## 2015-11-29 ENCOUNTER — Other Ambulatory Visit: Payer: Self-pay | Admitting: Family Medicine

## 2015-12-06 ENCOUNTER — Other Ambulatory Visit: Payer: Self-pay | Admitting: Cardiovascular Disease

## 2015-12-06 NOTE — Telephone Encounter (Signed)
Rx request sent to pharmacy.  

## 2016-01-19 ENCOUNTER — Other Ambulatory Visit: Payer: Self-pay | Admitting: Family Medicine

## 2016-01-20 ENCOUNTER — Telehealth: Payer: Self-pay

## 2016-01-20 NOTE — Telephone Encounter (Signed)
Call to schedule AWV; No answer; Edited note on apt schedule 10/20 with Dr. Caryl NeverBurchette to schedule AWV

## 2016-02-09 ENCOUNTER — Encounter (HOSPITAL_COMMUNITY): Payer: Self-pay

## 2016-02-09 ENCOUNTER — Emergency Department (HOSPITAL_COMMUNITY): Payer: Medicare Other

## 2016-02-09 ENCOUNTER — Observation Stay (HOSPITAL_COMMUNITY)
Admission: EM | Admit: 2016-02-09 | Discharge: 2016-02-10 | Disposition: A | Discharge status: Home / Self Care | Payer: Medicare Other | Attending: Internal Medicine | Admitting: Internal Medicine

## 2016-02-09 DIAGNOSIS — N183 Chronic kidney disease, stage 3 (moderate): Secondary | ICD-10-CM | POA: Diagnosis not present

## 2016-02-09 DIAGNOSIS — J449 Chronic obstructive pulmonary disease, unspecified: Secondary | ICD-10-CM | POA: Insufficient documentation

## 2016-02-09 DIAGNOSIS — E039 Hypothyroidism, unspecified: Secondary | ICD-10-CM | POA: Diagnosis present

## 2016-02-09 DIAGNOSIS — Z79899 Other long term (current) drug therapy: Secondary | ICD-10-CM | POA: Insufficient documentation

## 2016-02-09 DIAGNOSIS — E1165 Type 2 diabetes mellitus with hyperglycemia: Secondary | ICD-10-CM

## 2016-02-09 DIAGNOSIS — E1122 Type 2 diabetes mellitus with diabetic chronic kidney disease: Secondary | ICD-10-CM | POA: Diagnosis not present

## 2016-02-09 DIAGNOSIS — Z7901 Long term (current) use of anticoagulants: Secondary | ICD-10-CM | POA: Diagnosis not present

## 2016-02-09 DIAGNOSIS — Z8673 Personal history of transient ischemic attack (TIA), and cerebral infarction without residual deficits: Secondary | ICD-10-CM | POA: Diagnosis not present

## 2016-02-09 DIAGNOSIS — G4730 Sleep apnea, unspecified: Secondary | ICD-10-CM | POA: Diagnosis present

## 2016-02-09 DIAGNOSIS — I160 Hypertensive urgency: Secondary | ICD-10-CM | POA: Diagnosis present

## 2016-02-09 DIAGNOSIS — I129 Hypertensive chronic kidney disease with stage 1 through stage 4 chronic kidney disease, or unspecified chronic kidney disease: Secondary | ICD-10-CM | POA: Diagnosis not present

## 2016-02-09 DIAGNOSIS — Z7984 Long term (current) use of oral hypoglycemic drugs: Secondary | ICD-10-CM | POA: Insufficient documentation

## 2016-02-09 DIAGNOSIS — I480 Paroxysmal atrial fibrillation: Secondary | ICD-10-CM | POA: Diagnosis present

## 2016-02-09 DIAGNOSIS — R072 Precordial pain: Secondary | ICD-10-CM | POA: Diagnosis not present

## 2016-02-09 DIAGNOSIS — E1149 Type 2 diabetes mellitus with other diabetic neurological complication: Secondary | ICD-10-CM | POA: Diagnosis not present

## 2016-02-09 DIAGNOSIS — R079 Chest pain, unspecified: Secondary | ICD-10-CM | POA: Diagnosis present

## 2016-02-09 DIAGNOSIS — E1140 Type 2 diabetes mellitus with diabetic neuropathy, unspecified: Secondary | ICD-10-CM | POA: Diagnosis present

## 2016-02-09 DIAGNOSIS — I739 Peripheral vascular disease, unspecified: Secondary | ICD-10-CM | POA: Diagnosis present

## 2016-02-09 DIAGNOSIS — IMO0002 Reserved for concepts with insufficient information to code with codable children: Secondary | ICD-10-CM | POA: Diagnosis present

## 2016-02-09 LAB — BASIC METABOLIC PANEL
Anion gap: 9 (ref 5–15)
BUN: 22 mg/dL — ABNORMAL HIGH (ref 6–20)
CO2: 25 mmol/L (ref 22–32)
Calcium: 9.6 mg/dL (ref 8.9–10.3)
Chloride: 100 mmol/L — ABNORMAL LOW (ref 101–111)
Creatinine, Ser: 0.95 mg/dL (ref 0.44–1.00)
GFR calc Af Amer: >60 mL/min (ref >60)
GFR calc non Af Amer: 56 mL/min — ABNORMAL LOW (ref >60)
Glucose, Bld: 152 mg/dL — ABNORMAL HIGH (ref 65–99)
Potassium: 3.6 mmol/L (ref 3.5–5.1)
Sodium: 134 mmol/L — ABNORMAL LOW (ref 135–145)

## 2016-02-09 LAB — CBC
HCT: 37.8 % (ref 36.0–46.0)
Hemoglobin: 12.3 g/dL (ref 12.0–15.0)
MCH: 28.2 pg (ref 26.0–34.0)
MCHC: 32.5 g/dL (ref 30.0–36.0)
MCV: 86.7 fL (ref 78.0–100.0)
Platelets: 255 10*3/uL (ref 150–400)
RBC: 4.36 MIL/uL (ref 3.87–5.11)
RDW: 14.3 % (ref 11.5–15.5)
WBC: 10.4 10*3/uL (ref 4.0–10.5)

## 2016-02-09 LAB — I-STAT TROPONIN, ED: Troponin i, poc: 0 ng/mL (ref 0.00–0.08)

## 2016-02-09 MED ORDER — HEPARIN (PORCINE) IN NACL 100-0.45 UNIT/ML-% IJ SOLN
900.0000 [IU]/h | INTRAMUSCULAR | Status: DC
  Filled 2016-02-09: qty 250

## 2016-02-09 MED ORDER — PREGABALIN 75 MG PO CAPS
150.0000 mg | ORAL_CAPSULE | Freq: Every day | ORAL | Status: DC
  Administered 2016-02-10: 150 mg via ORAL
  Filled 2016-02-09: qty 2

## 2016-02-09 MED ORDER — ACETAMINOPHEN 325 MG PO TABS: 650.0000 mg | ORAL_TABLET | ORAL | Status: DC | PRN

## 2016-02-09 MED ORDER — PANTOPRAZOLE SODIUM 40 MG PO TBEC
40.0000 mg | DELAYED_RELEASE_TABLET | Freq: Two times a day (BID) | ORAL | Status: DC
  Administered 2016-02-10 (×2): 40 mg via ORAL
  Filled 2016-02-09 (×2): qty 1

## 2016-02-09 MED ORDER — ATORVASTATIN CALCIUM 40 MG PO TABS: 40.0000 mg | ORAL_TABLET | Freq: Every day | ORAL | Status: DC

## 2016-02-09 MED ORDER — BUDESONIDE 0.25 MG/2ML IN SUSP
0.2500 mg | Freq: Two times a day (BID) | RESPIRATORY_TRACT | Status: DC
  Filled 2016-02-09: qty 2

## 2016-02-09 MED ORDER — ONDANSETRON HCL 4 MG/2ML IJ SOLN: 4.0000 mg | Freq: Four times a day (QID) | INTRAMUSCULAR | Status: DC | PRN

## 2016-02-09 MED ORDER — LEVOTHYROXINE SODIUM 50 MCG PO TABS
50.0000 ug | ORAL_TABLET | Freq: Every day | ORAL | Status: DC
  Administered 2016-02-10: 50 ug via ORAL
  Filled 2016-02-09: qty 1

## 2016-02-09 MED ORDER — ASPIRIN EC 325 MG PO TBEC
325.0000 mg | DELAYED_RELEASE_TABLET | Freq: Every day | ORAL | Status: DC
  Administered 2016-02-10: 325 mg via ORAL
  Filled 2016-02-09: qty 1

## 2016-02-09 MED ORDER — CARVEDILOL 6.25 MG PO TABS
6.2500 mg | ORAL_TABLET | Freq: Two times a day (BID) | ORAL | Status: DC
  Administered 2016-02-10: 6.25 mg via ORAL
  Filled 2016-02-09: qty 1

## 2016-02-09 MED ORDER — HYDRALAZINE HCL 20 MG/ML IJ SOLN
5.0000 mg | INTRAMUSCULAR | Status: DC | PRN
  Administered 2016-02-09: 5 mg via INTRAVENOUS
  Filled 2016-02-09: qty 1

## 2016-02-09 MED ORDER — DULOXETINE HCL 30 MG PO CPEP
30.0000 mg | ORAL_CAPSULE | Freq: Two times a day (BID) | ORAL | Status: DC
  Administered 2016-02-10 (×2): 30 mg via ORAL
  Filled 2016-02-09: qty 1

## 2016-02-09 MED ORDER — IRBESARTAN 75 MG PO TABS: 75.0000 mg | ORAL_TABLET | Freq: Every day | ORAL | Status: DC

## 2016-02-09 MED ORDER — VITAMIN D 1000 UNITS PO TABS
2000.0000 [IU] | ORAL_TABLET | Freq: Every day | ORAL | Status: DC
  Administered 2016-02-10: 2000 [IU] via ORAL
  Filled 2016-02-09: qty 2

## 2016-02-09 MED ORDER — INSULIN DETEMIR 100 UNIT/ML ~~LOC~~ SOLN
15.0000 [IU] | Freq: Two times a day (BID) | SUBCUTANEOUS | Status: DC
  Administered 2016-02-10: 15 [IU] via SUBCUTANEOUS
  Filled 2016-02-09 (×2): qty 0.15

## 2016-02-09 MED ORDER — MECLIZINE HCL 25 MG PO TABS: 25.0000 mg | ORAL_TABLET | Freq: Three times a day (TID) | ORAL | Status: DC | PRN

## 2016-02-09 MED ORDER — INSULIN ASPART 100 UNIT/ML ~~LOC~~ SOLN
0.0000 [IU] | Freq: Three times a day (TID) | SUBCUTANEOUS | Status: DC
  Administered 2016-02-10: 2 [IU] via SUBCUTANEOUS
  Administered 2016-02-10: 3 [IU] via SUBCUTANEOUS

## 2016-02-09 MED ORDER — IPRATROPIUM-ALBUTEROL 0.5-2.5 (3) MG/3ML IN SOLN: 3.0000 mL | Freq: Four times a day (QID) | RESPIRATORY_TRACT | Status: DC | PRN

## 2016-02-09 MED ORDER — ALBUTEROL SULFATE (2.5 MG/3ML) 0.083% IN NEBU: 2.5000 mg | INHALATION_SOLUTION | RESPIRATORY_TRACT | Status: DC | PRN

## 2016-02-09 NOTE — ED Notes (Signed)
Patient transported to X-ray 

## 2016-02-09 NOTE — ED Provider Notes (Signed)
[facility]-EMERGENCY DEPT Provider Note   CSN: [id number] Arrival date & time: 02/09/16  2109     History   Chief Complaint Chief Complaint  Patient presents with  . Chest Pain    HPI Casey Harrell is a 81 y.o. female.  The history is provided by the patient, the spouse and a relative (son).  Chest Pain   This is a new problem. The current episode started 1 to 2 hours ago. The problem occurs constantly. The problem has been resolved. The pain is associated with movement (was standing up washing dishes). The pain is present in the substernal region. The pain is moderate. The quality of the pain is described as heavy and pressure-like. The pain does not radiate. Duration of episode(s) is 1 hour. Associated symptoms include diaphoresis, malaise/fatigue and nausea. Pertinent negatives include no abdominal pain, no back pain, no cough, no fever, no headaches, no leg pain, no lower extremity edema and no shortness of breath. She has tried rest (aspirin) for the symptoms. The treatment provided significant relief.    Past Medical History:  Diagnosis Date  . ASTHMA 07/15/2008  . COPD (chronic obstructive pulmonary disease) (HCC)   . DIABETES-TYPE 2 07/15/2008  . DIABETIC PERIPHERAL NEUROPATHY 09/02/2009  . GERD 07/15/2008  . HYPERTENSION 07/15/2008  . Normal cardiac stress test    low risk Nuc 2009, Feb 2011  . Paroxysmal atrial fibrillation (HCC)   . PVD (peripheral vascular disease) (HCC) 06/18/09   RCE  . Stroke (HCC)   . Vertigo     Patient Active Problem List   Diagnosis Date Noted  . Chest pain 02/09/2016  . BPPV (benign paroxysmal positional vertigo) 08/25/2015  . HLD (hyperlipidemia) 08/25/2015  . At high risk for falls 07/01/2015  . Vertigo 06/21/2015  . Paroxysmal atrial fibrillation (HCC) 06/11/2015  . Cerebral infarction due to stenosis of left middle cerebral artery (HCC) 05/20/2015  . Intracranial vascular stenosis 05/20/2015  . Type 2 diabetes mellitus with circulatory  disorder (HCC) 05/20/2015  . Type 2 diabetes, uncontrolled, with neuropathy (HCC)   . Abdominal pain   . Partial small bowel obstruction   . Benign essential HTN   . Small bowel obstruction 05/07/2015  . Hemiplegia, unspecified affecting right dominant side (HCC) 04/08/2015  . Embolic stroke involving posterior cerebral artery (HCC) 04/05/2015  . Orthostatic hypotension   . Hypertensive urgency 04/01/2015  . Fever in adult 04/01/2015  . Acute encephalopathy 04/01/2015  . Confusion   . CVA (cerebral infarction)   . Posterior circulation stroke (HCC)   . Acute ischemic stroke (HCC) 03/31/2015  . Stroke (HCC) 03/31/2015  . CKD (chronic kidney disease) stage 3, GFR 30-59 ml/min 04/17/2014  . Gastroenteritis, non-infectious 04/11/2014  . Nausea vomiting and diarrhea 04/10/2014  . Hypokalemia 04/10/2014  . Dehydration 04/09/2014  . Bronchitis with airway obstruction (HCC) 02/03/2013  . Leukocytosis 02/03/2013  . Fever 02/03/2013  . Acute respiratory failure with hypoxia (HCC) 02/03/2013  . Depression 02/03/2013  . Hyponatremia 02/03/2013  . Obesity (BMI 30-39.9) 01/10/2013  . PVD (peripheral vascular disease)- S/P RCE 06/18/09 12/13/2012  . Sleep apnea- C-pap intol 12/13/2012  . Low risk cardiac nuclear stress test- 2009 and Feb 2011 12/13/2012  . Mild persistent asthma 11/29/2011  . Chronic cough 06/05/2011  . Diabetic neuropathy (HCC) 09/02/2009  . Dyslipidemia 07/13/2009  . Hypothyroidism 11/12/2008  . Type 2 diabetes mellitus, uncontrolled (HCC) 07/15/2008  . Essential hypertension 07/15/2008  . GERD 07/15/2008  . DIVERTICULOSIS, COLON 07/15/2008  Past Surgical History:  Procedure Laterality Date  . ABDOMINAL HYSTERECTOMY  1971  . APPENDECTOMY  1971  . CAROTID ENDARTERECTOMY  06/18/09   RCE  . CESAREAN SECTION    . colectomy and colostomy    . FLEXIBLE SIGMOIDOSCOPY     diverticulitis  . FOOT SURGERY     right foot X 2  . LAPAROTOMY     X 3 with adhesions lysis    . TONSILLECTOMY    . TUBAL LIGATION      OB History    No data available       Home Medications    Prior to Admission medications   Medication Sig Start Date End Date Taking? Authorizing Provider  albuterol (ACCUNEB) 1.25 MG/3ML nebulizer solution Take 3 mLs (1.25 mg total) by nebulization every 4 (four) hours as needed for wheezing. Reported on 04/26/2015 05/13/15  Yes Jeralyn Bennett, MD  atorvastatin (LIPITOR) 40 MG tablet Take 1 tablet (40 mg total) by mouth 1 day or 1 dose. Patient taking differently: Take 40 mg by mouth daily.  05/26/15  Yes [name], MD  budesonide (PULMICORT) 0.25 MG/2ML nebulizer solution Take 2 mLs (0.25 mg total) by nebulization 2 (two) times daily. 05/13/15  Yes Jeralyn Bennett, MD  carvedilol (COREG) 6.25 MG tablet Take 1 tablet (6.25 mg total) by mouth 2 (two) times daily with a meal. 05/26/15  Yes [name], MD  Cholecalciferol (VITAMIN D3) 2000 UNITS TABS Take 2,000 Units by mouth daily. Reported on 04/26/2015   Yes Historical Provider, MD  DULoxetine (CYMBALTA) 30 MG capsule Take one capsule by mouth once daily for two week and then increase to two daily Patient taking differently: Take 30 mg by mouth 2 (two) times daily.  07/01/15  Yes [name], MD  ELIQUIS 5 MG TABS tablet TAKE 1 TABLET(5 MG) BY MOUTH TWICE DAILY 12/06/15  Yes Runell Gess, MD  HUMALOG KWIKPEN 100 UNIT/ML KiwkPen INJECT 9-14 UNITS SUB-Q TWICE A DAY WITH A MEAL. 9U BEFORE BREAKFAST AND 14 UNITS AFTER SUPPER 04/21/15  Yes Historical Provider, MD  ipratropium-albuterol (DUONEB) 0.5-2.5 (3) MG/3ML SOLN Take 3 mLs by nebulization every 6 (six) hours as needed (shortness of breath or wheezing). 04/20/15  Yes Evlyn Kanner Love, PA-C  irbesartan (AVAPRO) 75 MG tablet Take 1 tablet (75 mg total) by mouth daily. 08/06/15  Yes Bruce Colletta Maryland, MD  LEVEMIR FLEXTOUCH 100 UNIT/ML Pen INJECT 40 UNITS INTO THE SKIN TWICE DAILY Patient taking differently: INJECT 30 UNITS INTO THE  SKIN TWICE DAILY 10/13/15  Yes [name], MD  levothyroxine (SYNTHROID, LEVOTHROID) 50 MCG tablet TAKE 1 TABLET BY MOUTH EVERY DAY BEFORE BREAKFAST 07/21/15  Yes [name], MD  LYRICA 150 MG capsule TAKE 1 CAPSULE BY MOUTH DAILY 11/03/15  Yes [name], MD  meclizine (ANTIVERT) 25 MG tablet Take 1 tablet (25 mg total) by mouth 3 (three) times daily as needed for dizziness. 06/22/15  Yes Marinda Elk, MD  metFORMIN (GLUCOPHAGE) 1000 MG tablet Take 1 tablet (1,000 mg total) by mouth 2 (two) times daily with a meal. 05/26/15  Yes [name], MD  pantoprazole (PROTONIX) 40 MG tablet TAKE 1 TABLET BY MOUTH TWICE DAILY 01/19/16  Yes [name], MD  ondansetron (ZOFRAN ODT) 4 MG disintegrating tablet Take 1 tablet (4 mg total) by mouth every 8 (eight) hours as needed for nausea or vomiting. Patient not taking: Reported on 02/09/2016 04/09/14   Roxy Horseman, PA-C  Family History Family History  Problem Relation Age of Onset  . Diabetes Mother   . Arthritis Mother   . Heart disease Mother   . Heart disease Father   . Heart disease Sister   . Heart disease Brother   . Cancer Brother 60    lung cancer  . Celiac disease Sister   . Stroke Maternal Grandfather   . Cancer Paternal Grandfather     lung  . Heart disease Sister   . Asthma Sister     Social History Social History  Substance Use Topics  . Smoking status: Never Smoker  . Smokeless tobacco: Never Used  . Alcohol use No     Allergies   Doxycycline hyclate; Morphine sulfate; Iohexol; and Moxifloxacin   Review of Systems Review of Systems  Constitutional: Positive for diaphoresis and malaise/fatigue. Negative for fever.  HENT: Negative for congestion.   Respiratory: Negative for cough and shortness of breath.   Cardiovascular: Positive for chest pain.  Gastrointestinal: Positive for nausea. Negative for abdominal pain.  Genitourinary: Negative for flank pain.  Musculoskeletal:  Negative for back pain.  Skin: Negative for rash.  Neurological: Negative for headaches.  Psychiatric/Behavioral: Negative for confusion.     Physical Exam Updated Vital Signs BP (!) 145/56 (BP Location: Right Arm)   Pulse 76   Temp 97.9 F (36.6 C) (Oral)   Resp 18   Ht 5' (1.524 m)   Wt 63 kg   SpO2 96%   BMI 27.13 kg/m   Physical Exam  Constitutional: She is oriented to person, place, and time. She appears well-developed and well-nourished. No distress.  Pleasant, cooperative, well-appearing  HENT:  Head: Normocephalic and atraumatic.  Eyes: Conjunctivae are normal. No scleral icterus.  Neck: Normal range of motion. Neck supple. No JVD present.  Cardiovascular: Normal rate, regular rhythm and intact distal pulses.  Exam reveals no gallop and no friction rub.   Murmur heard. Holosystolic murmur  Pulmonary/Chest: Effort normal and breath sounds normal. No respiratory distress. She has no rales. She exhibits no tenderness.  Abdominal: Soft. She exhibits no distension. There is no tenderness.  Musculoskeletal: She exhibits no edema or tenderness.  Symmetric size and appearance of b/l Le's, no calf swelling or tenderness  Neurological: She is alert and oriented to person, place, and time. She exhibits normal muscle tone. Coordination normal.  Skin: Skin is warm and dry. No rash noted. She is not diaphoretic.  Psychiatric: She has a normal mood and affect.  Nursing note and vitals reviewed.    ED Treatments / Results  Labs (all labs ordered are listed, but only abnormal results are displayed) Labs Reviewed  BASIC METABOLIC PANEL - Abnormal; Notable for the following:       Result Value   Sodium 134 (*)    Chloride 100 (*)    Glucose, Bld 152 (*)    BUN 22 (*)    GFR calc non Af Amer 56 (*)    All other components within normal limits  CBC  TROPONIN I  TROPONIN I  TROPONIN I  HEPARIN LEVEL (UNFRACTIONATED)  APTT  CBC  APTT  I-STAT TROPOININ, ED    EKG  EKG  Interpretation  Date/Time:  Wednesday February 09 2016 21:12:30 EDT Ventricular Rate:  64 PR Interval:    QRS Duration: 100 QT Interval:  423 QTC Calculation: 440 R Axis:   3 Text Interpretation:  Sinus rhythm Low voltage, precordial leads Nonspecific T abnormalities, lateral leads No significant change since  last tracing Confirmed by [facility] MD, JASON [id number]) on 02/09/2016 9:16:24 PM       Radiology Dg Chest 2 View  Result Date: 02/10/2016 CLINICAL DATA:  Chest pain and dyspnea EXAM: CHEST  2 VIEW COMPARISON:  06/20/2015 FINDINGS: The heart is mildly enlarged. The thoracic aorta is not aneurysmal. No pneumonic consolidation, effusion or pneumothorax. No suspicious osseous abnormalities. IMPRESSION: No active cardiopulmonary disease. Electronically Signed   By: Tollie Eth M.D.   On: 02/10/2016 00:07    Procedures Procedures (including critical care time)  Medications Ordered in ED Medications  pantoprazole (PROTONIX) EC tablet 40 mg (40 mg Oral Given 02/10/16 0104)  pregabalin (LYRICA) capsule 150 mg (not administered)  insulin detemir (LEVEMIR) injection 15 Units (not administered)  irbesartan (AVAPRO) tablet 75 mg (not administered)  levothyroxine (SYNTHROID, LEVOTHROID) tablet 50 mcg (not administered)  DULoxetine (CYMBALTA) DR capsule 30 mg (30 mg Oral Given 02/10/16 0104)  meclizine (ANTIVERT) tablet 25 mg (not administered)  atorvastatin (LIPITOR) tablet 40 mg (not administered)  carvedilol (COREG) tablet 6.25 mg (not administered)  albuterol (PROVENTIL) (2.5 MG/3ML) 0.083% nebulizer solution 2.5 mg (not administered)  ipratropium-albuterol (DUONEB) 0.5-2.5 (3) MG/3ML nebulizer solution 3 mL (not administered)  cholecalciferol (VITAMIN D) tablet 2,000 Units (not administered)  acetaminophen (TYLENOL) tablet 650 mg (not administered)  ondansetron (ZOFRAN) injection 4 mg (not administered)  insulin aspart (novoLOG) injection 0-9 Units (not administered)  aspirin EC tablet 325  mg (not administered)  hydrALAZINE (APRESOLINE) injection 5 mg (5 mg Intravenous Given 02/09/16 2355)  heparin ADULT infusion 100 units/mL (25000 units/272mL sodium chloride 0.45%) (not administered)  budesonide (PULMICORT) nebulizer solution 0.25 mg (not administered)  Influenza vac split quadrivalent PF (FLUARIX) injection 0.5 mL (not administered)  zolpidem (AMBIEN) tablet 5 mg (5 mg Oral Given 02/10/16 0105)     Initial Impression / Assessment and Plan / ED Course  I have reviewed the triage vital signs and the nursing notes.  Pertinent labs & imaging results that were available during my care of the patient were reviewed by me and considered in my medical decision making (see chart for details).  Clinical Course   Casey Harrell is a 81 y.o. female with h/o three prior CVA's, Dm, COPD, no known coronary disease, who presents to ED for evaluation of sudden-onset substernal nonradiating chest pressure associated with nausea and diaphoresis, no shortness of breath. Atypical chest pain in pt with vascular disease, however, favored may be GERD, as pt feels nausea and "gas"/indigestion. Needs angina rule-out with stress, as last stress was 2011. Doubt Pe, doubt dissection. Admitted for observation.  Pt condition, course, and admission were discussed with attending physician Dr. Marily Memos.  Final Clinical Impressions(s) / ED Diagnoses   Final diagnoses:  Chest pain, unspecified type    New Prescriptions Current Discharge Medication List       Horald Pollen, MD 02/10/16 0102    Marily Memos, MD 02/10/16 [phone]

## 2016-02-09 NOTE — H&P (Signed)
History and Physical    Casey Harrell:[id number] DOB: 1935/01/03 DOA: 02/09/2016  PCP: [name], MD  Patient coming from: Home.  Chief Complaint: Chest pain.  HPI: Casey Harrell is a 81 y.o. female with history of stroke, hypertension, hyperlipidemia, atrial fibrillation, diabetes mellitus presents to the ER because of chest pain. Patient was doing her dishes last evening around 6 PM when patient started developing chest pain with diaphoresis. Patient chest pain was substernal pressure-like nonradiating and lasted for an hour. Denies any shortness of breath or productive cough. EMS was called and patient was given aspirin following which patient's chest pain resolved. In the ER EKG cardiac markers and chest x-ray were unremarkable. Patient did take Apixaban prior to coming. Patient is being admitted for further management of chest pain. Patient is chest pain-free at this time. Patient's blood pressure was found to be elevated on admission. Patient's husband states that patient has been having elevated blood pressure recently.  ED Course: Cardiac markers EKG and chest x-ray were done which were unremarkable.  Review of Systems: As per HPI, rest all negative.   Past Medical History:  Diagnosis Date  . ASTHMA 07/15/2008  . COPD (chronic obstructive pulmonary disease) (HCC)   . DIABETES-TYPE 2 07/15/2008  . DIABETIC PERIPHERAL NEUROPATHY 09/02/2009  . GERD 07/15/2008  . HYPERTENSION 07/15/2008  . Normal cardiac stress test    low risk Nuc 2009, Feb 2011  . Paroxysmal atrial fibrillation (HCC)   . PVD (peripheral vascular disease) (HCC) 06/18/09   RCE  . Stroke (HCC)   . Vertigo     Past Surgical History:  Procedure Laterality Date  . ABDOMINAL HYSTERECTOMY  1971  . APPENDECTOMY  1971  . CAROTID ENDARTERECTOMY  06/18/09   RCE  . CESAREAN SECTION    . colectomy and colostomy    . FLEXIBLE SIGMOIDOSCOPY     diverticulitis  . FOOT SURGERY     right foot X 2  . LAPAROTOMY     X 3 with adhesions lysis  . TONSILLECTOMY    . TUBAL LIGATION       reports that she has never smoked. She has never used smokeless tobacco. She reports that she does not drink alcohol. Her drug history is not on file.  Allergies  Allergen Reactions  . Doxycycline Hyclate Nausea And Vomiting  . Morphine Sulfate Other (See Comments)    REACTION: hallucinations  . Iohexol Hives     Code: HIVES, Desc: pt. states she breaks out in hives 06/15/08   . Moxifloxacin Other (See Comments)    REACTION: unknown    Family History  Problem Relation Age of Onset  . Diabetes Mother   . Arthritis Mother   . Heart disease Mother   . Heart disease Father   . Heart disease Sister   . Heart disease Brother   . Cancer Brother 60    lung cancer  . Celiac disease Sister   . Stroke Maternal Grandfather   . Cancer Paternal Grandfather     lung  . Heart disease Sister   . Asthma Sister     Prior to Admission medications   Medication Sig Start Date End Date Taking? Authorizing Provider  albuterol (ACCUNEB) 1.25 MG/3ML nebulizer solution Take 3 mLs (1.25 mg total) by nebulization every 4 (four) hours as needed for wheezing. Reported on 04/26/2015 05/13/15  Yes Jeralyn Bennett, MD  atorvastatin (LIPITOR) 40 MG tablet Take 1 tablet (40 mg total) by mouth 1 day  or 1 dose. Patient taking differently: Take 40 mg by mouth daily.  05/26/15  Yes [name], MD  budesonide (PULMICORT) 0.25 MG/2ML nebulizer solution Take 2 mLs (0.25 mg total) by nebulization 2 (two) times daily. 05/13/15  Yes Jeralyn Bennett, MD  carvedilol (COREG) 6.25 MG tablet Take 1 tablet (6.25 mg total) by mouth 2 (two) times daily with a meal. 05/26/15  Yes [name], MD  Cholecalciferol (VITAMIN D3) 2000 UNITS TABS Take 2,000 Units by mouth daily. Reported on 04/26/2015   Yes Historical Provider, MD  DULoxetine (CYMBALTA) 30 MG capsule Take one capsule by mouth once daily for two week and then increase to two daily Patient  taking differently: Take 30 mg by mouth 2 (two) times daily.  07/01/15  Yes [name], MD  ELIQUIS 5 MG TABS tablet TAKE 1 TABLET(5 MG) BY MOUTH TWICE DAILY 12/06/15  Yes Runell Gess, MD  HUMALOG KWIKPEN 100 UNIT/ML KiwkPen INJECT 9-14 UNITS SUB-Q TWICE A DAY WITH A MEAL. 9U BEFORE BREAKFAST AND 14 UNITS AFTER SUPPER 04/21/15  Yes Historical Provider, MD  ipratropium-albuterol (DUONEB) 0.5-2.5 (3) MG/3ML SOLN Take 3 mLs by nebulization every 6 (six) hours as needed (shortness of breath or wheezing). 04/20/15  Yes Evlyn Kanner Love, PA-C  irbesartan (AVAPRO) 75 MG tablet Take 1 tablet (75 mg total) by mouth daily. 08/06/15  Yes Bruce Colletta Maryland, MD  LEVEMIR FLEXTOUCH 100 UNIT/ML Pen INJECT 40 UNITS INTO THE SKIN TWICE DAILY Patient taking differently: INJECT 30 UNITS INTO THE SKIN TWICE DAILY 10/13/15  Yes [name], MD  levothyroxine (SYNTHROID, LEVOTHROID) 50 MCG tablet TAKE 1 TABLET BY MOUTH EVERY DAY BEFORE BREAKFAST 07/21/15  Yes [name], MD  LYRICA 150 MG capsule TAKE 1 CAPSULE BY MOUTH DAILY 11/03/15  Yes [name], MD  meclizine (ANTIVERT) 25 MG tablet Take 1 tablet (25 mg total) by mouth 3 (three) times daily as needed for dizziness. 06/22/15  Yes Marinda Elk, MD  metFORMIN (GLUCOPHAGE) 1000 MG tablet Take 1 tablet (1,000 mg total) by mouth 2 (two) times daily with a meal. 05/26/15  Yes [name], MD  pantoprazole (PROTONIX) 40 MG tablet TAKE 1 TABLET BY MOUTH TWICE DAILY 01/19/16  Yes [name], MD  ondansetron (ZOFRAN ODT) 4 MG disintegrating tablet Take 1 tablet (4 mg total) by mouth every 8 (eight) hours as needed for nausea or vomiting. Patient not taking: Reported on 02/09/2016 04/09/14   Roxy Horseman, PA-C    Physical Exam: Vitals:   02/09/16 2114 02/09/16 2130 02/09/16 2150 02/09/16 2230  BP:  (!) 180/45  (!) 182/109  Pulse:  63  65  Resp:  14  22  Temp:      TempSrc:      SpO2:  95% 94% 94%  Weight: 63.5 kg (140 lb)       Height: 5\' 1"  (1.549 m)         Constitutional: Moderately built and nourished. Vitals:   02/09/16 2114 02/09/16 2130 02/09/16 2150 02/09/16 2230  BP:  (!) 180/45  (!) 182/109  Pulse:  63  65  Resp:  14  22  Temp:      TempSrc:      SpO2:  95% 94% 94%  Weight: 63.5 kg (140 lb)     Height: 5\' 1"  (1.549 m)      Eyes: Anicteric no pallor. ENMT: No discharge from the ears eyes nose or mouth. Neck: No JVD appreciated  no mass felt. Respiratory: No rhonchi or crepitations. Cardiovascular: S1-S2 heard no murmur appreciated. Abdomen: Soft nontender bowel sounds present. No guarding or rigidity. Musculoskeletal: No edema. No joint effusions. Skin: No rash. Skin appears warm. Neurologic: Moving all extremities 5 x 5. No facial asymmetry. PERRLA positive. Psychiatric: Alert awake oriented to time place and person. Appears normal.   Labs on Admission: I have personally reviewed following labs and imaging studies  CBC:  Recent Labs Lab 02/09/16 2117  WBC 10.4  HGB 12.3  HCT 37.8  MCV 86.7  PLT 255   Basic Metabolic Panel:  Recent Labs Lab 02/09/16 2117  NA 134*  K 3.6  CL 100*  CO2 25  GLUCOSE 152*  BUN 22*  CREATININE 0.95  CALCIUM 9.6   GFR: Estimated Creatinine Clearance: 41.7 mL/min (by C-G formula based on SCr of 0.95 mg/dL). Liver Function Tests: No results for input(s): AST, ALT, ALKPHOS, BILITOT, PROT, ALBUMIN in the last 168 hours. No results for input(s): LIPASE, AMYLASE in the last 168 hours. No results for input(s): AMMONIA in the last 168 hours. Coagulation Profile: No results for input(s): INR, PROTIME in the last 168 hours. Cardiac Enzymes: No results for input(s): CKTOTAL, CKMB, CKMBINDEX, TROPONINI in the last 168 hours. BNP (last 3 results) No results for input(s): PROBNP in the last 8760 hours. HbA1C: No results for input(s): HGBA1C in the last 72 hours. CBG: No results for input(s): GLUCAP in the last 168 hours. Lipid Profile: No  results for input(s): CHOL, HDL, LDLCALC, TRIG, CHOLHDL, LDLDIRECT in the last 72 hours. Thyroid Function Tests: No results for input(s): TSH, T4TOTAL, FREET4, T3FREE, THYROIDAB in the last 72 hours. Anemia Panel: No results for input(s): VITAMINB12, FOLATE, FERRITIN, TIBC, IRON, RETICCTPCT in the last 72 hours. Urine analysis:    Component Value Date/Time   COLORURINE YELLOW 06/20/2015 1953   APPEARANCEUR CLEAR 06/20/2015 1953   LABSPEC 1.004 (L) 06/20/2015 1953   PHURINE 6.5 06/20/2015 1953   GLUCOSEU NEGATIVE 06/20/2015 1953   HGBUR NEGATIVE 06/20/2015 1953   BILIRUBINUR NEGATIVE 06/20/2015 1953   BILIRUBINUR + 08/25/2011 1649   KETONESUR NEGATIVE 06/20/2015 1953   PROTEINUR NEGATIVE 06/20/2015 1953   UROBILINOGEN 0.2 11/05/2014 1555   NITRITE NEGATIVE 06/20/2015 1953   LEUKOCYTESUR NEGATIVE 06/20/2015 1953   Sepsis Labs: @LABRCNTIP (procalcitonin:4,lacticidven:4) )No results found for this or any previous visit (from the past 240 hour(s)).   Radiological Exams on Admission: No results found.  EKG: Independently reviewed. Normal sinus rhythm.  Assessment/Plan Principal Problem:   Chest pain Active Problems:   Hypothyroidism   PVD (peripheral vascular disease)- S/P RCE 06/18/09   Sleep apnea- C-pap intol   Hypertensive urgency   Type 2 diabetes, uncontrolled, with neuropathy (HCC)   Paroxysmal atrial fibrillation (HCC)    1. Chest pain concerning for angina. - Since patient may need procedure I'm holding off Apixaban and placing patient on heparin per pharmacy protocol. Cycle cardiac markers check 2-D echo aspirin when necessary nitroglycerin. Consult cardiology in a.m. Patient is already on Coreg and statins. 2. Hypertension uncontrolled - in addition to patient's ARB and Coreg I have placed patient on when necessary IV hydralazine. Closely follow blood pressure trends. 3. Atrial fibrillation presently in sinus rhythm - chads 2 vasc score is 8. Patient is on apixaban and  Coreg. Presently placed on heparin instead of apixaban in anticipation of cardiac procedure. 4. Diabetes mellitus type 2 - since patient may be needing procedure have place patient nothing by mouth and decreased Levemir dose  from 30 units to 15 units for now. Closely follow CBGs with sliding scale coverage. 5. History of stroke on apixaban status. 6. History of peripheral vascular disease. 7. Hypothyroidism on Synthroid. 8. History of sleep apnea intolerant to CPAP.   DVT prophylaxis: Heparin. Code Status: Full code.  Family Communication: Discussed with patient's husband and son.  Disposition Plan: Home.  Consults called: None.  Admission status: Observation.    Eduard Clos MD [facility] Pager [phone].  If 7PM-7AM, please contact night-coverage [email or web address] Password [email or web address]  02/09/2016, 11:21 PM

## 2016-02-09 NOTE — Progress Notes (Signed)
ANTICOAGULATION CONSULT NOTE - Initial Consult  Pharmacy Consult for heparin Indication: atrial fibrillation  Allergies  Allergen Reactions  . Doxycycline Hyclate Nausea And Vomiting  . Morphine Sulfate Other (See Comments)    REACTION: hallucinations  . Iohexol Hives     Code: HIVES, Desc: pt. states she breaks out in hives 06/15/08   . Moxifloxacin Other (See Comments)    REACTION: unknown    Patient Measurements: Height: 5\' 1"  (154.9 cm) Weight: 140 lb (63.5 kg) IBW/kg (Calculated) : 47.8 Heparin Dosing Weight: 60kg  Vital Signs: Temp: 98.5 F (36.9 C) (10/04 2113) Temp Source: Oral (10/04 2113) BP: 182/109 (10/04 2230) Pulse Rate: 65 (10/04 2230)  Labs:  Recent Labs  02/09/16 2117  HGB 12.3  HCT 37.8  PLT 255  CREATININE 0.95    Estimated Creatinine Clearance: 41.7 mL/min (by C-G formula based on SCr of 0.95 mg/dL).   Medical History: Past Medical History:  Diagnosis Date  . ASTHMA 07/15/2008  . COPD (chronic obstructive pulmonary disease) (HCC)   . DIABETES-TYPE 2 07/15/2008  . DIABETIC PERIPHERAL NEUROPATHY 09/02/2009  . GERD 07/15/2008  . HYPERTENSION 07/15/2008  . Normal cardiac stress test    low risk Nuc 2009, Feb 2011  . Paroxysmal atrial fibrillation (HCC)   . PVD (peripheral vascular disease) (HCC) 06/18/09   RCE  . Stroke (HCC)   . Vertigo     Assessment: 81yo female c/o CP associated w/ N/V, relieved w/ Zofran and ASA, to transition from Eliquis to heparin for Afib; last dose of Eliquis was 10/4 at 2000.  Goal of Therapy:  Heparin level 0.3-0.7 units/ml aPTT 66-102 seconds Monitor platelets by anticoagulation protocol: Yes   Plan:  Will start heparin gtt 12 hours after last Eliquis dose at 900 units/hr and monitor heparin levels, aPTT, and CBC.  Vernard GamblesVeronda Sally Menard, PharmD, BCPS  02/09/2016,11:29 PM

## 2016-02-09 NOTE — ED Triage Notes (Signed)
Pt c/o of chest pain 2-10 now that started around 7pm with sudden onset of nausea and vomiting. That has stopped now after 4mg  zofran and 324ASA.

## 2016-02-10 ENCOUNTER — Observation Stay (HOSPITAL_BASED_OUTPATIENT_CLINIC_OR_DEPARTMENT_OTHER): Payer: Medicare Other

## 2016-02-10 ENCOUNTER — Other Ambulatory Visit: Payer: Self-pay | Admitting: Physician Assistant

## 2016-02-10 DIAGNOSIS — I480 Paroxysmal atrial fibrillation: Secondary | ICD-10-CM

## 2016-02-10 DIAGNOSIS — E1165 Type 2 diabetes mellitus with hyperglycemia: Secondary | ICD-10-CM

## 2016-02-10 DIAGNOSIS — I10 Essential (primary) hypertension: Secondary | ICD-10-CM | POA: Diagnosis not present

## 2016-02-10 DIAGNOSIS — R079 Chest pain, unspecified: Secondary | ICD-10-CM | POA: Diagnosis not present

## 2016-02-10 DIAGNOSIS — R0789 Other chest pain: Secondary | ICD-10-CM

## 2016-02-10 DIAGNOSIS — E1140 Type 2 diabetes mellitus with diabetic neuropathy, unspecified: Secondary | ICD-10-CM | POA: Diagnosis not present

## 2016-02-10 DIAGNOSIS — I739 Peripheral vascular disease, unspecified: Secondary | ICD-10-CM | POA: Diagnosis not present

## 2016-02-10 DIAGNOSIS — G4730 Sleep apnea, unspecified: Secondary | ICD-10-CM

## 2016-02-10 DIAGNOSIS — E039 Hypothyroidism, unspecified: Secondary | ICD-10-CM

## 2016-02-10 LAB — ECHOCARDIOGRAM COMPLETE
E decel time: 327 msec
E/e' ratio: 15.55
FS: 36 % (ref 28–44)
Height: 60 in
IVS/LV PW RATIO, ED: 1.5
LA ID, A-P, ES: 40 mm
LA diam end sys: 40 mm
LA diam index: 2.5 cm/m2
LA vol A4C: 46 ml
LA vol index: 32.8 mL/m2
LA vol: 52.5 mL
LV E/e' medial: 15.55
LV E/e'average: 15.55
LV PW d: 11.6 mm — AB (ref 0.6–1.1)
LV e' LATERAL: 5.33 cm/s
LVOT SV: 48 mL
LVOT VTI: 21.3 cm
LVOT area: 2.27 cm2
LVOT diameter: 17 mm
LVOT peak vel: 85 cm/s
MV Dec: 327
MV Peak grad: 3 mmHg
MV pk A vel: 98.5 m/s
MV pk E vel: 82.9 m/s
TDI e' lateral: 5.33
TDI e' medial: 4.03
Weight: 2222.4 oz

## 2016-02-10 LAB — CBC
HCT: 40.3 % (ref 36.0–46.0)
Hemoglobin: 13 g/dL (ref 12.0–15.0)
MCH: 28.3 pg (ref 26.0–34.0)
MCHC: 32.3 g/dL (ref 30.0–36.0)
MCV: 87.6 fL (ref 78.0–100.0)
Platelets: 264 10*3/uL (ref 150–400)
RBC: 4.6 MIL/uL (ref 3.87–5.11)
RDW: 14.5 % (ref 11.5–15.5)
WBC: 12 10*3/uL — ABNORMAL HIGH (ref 4.0–10.5)

## 2016-02-10 LAB — LIPID PANEL
Cholesterol: 99 mg/dL (ref 0–200)
HDL: 21 mg/dL — ABNORMAL LOW (ref >40)
LDL Cholesterol: 48 mg/dL (ref 0–99)
Total CHOL/HDL Ratio: 4.7 RATIO
Triglycerides: 149 mg/dL (ref <150)
VLDL: 30 mg/dL (ref 0–40)

## 2016-02-10 LAB — TROPONIN I
Troponin I: <0.03 ng/mL (ref <0.03)
Troponin I: <0.03 ng/mL (ref <0.03)
Troponin I: <0.03 ng/mL (ref <0.03)

## 2016-02-10 LAB — GLUCOSE, CAPILLARY
Glucose-Capillary: 170 mg/dL — ABNORMAL HIGH (ref 65–99)
Glucose-Capillary: 248 mg/dL — ABNORMAL HIGH (ref 65–99)
Glucose-Capillary: 100 mg/dL — ABNORMAL HIGH (ref 65–99)

## 2016-02-10 LAB — HEPARIN LEVEL (UNFRACTIONATED): Heparin Unfractionated: >2.2 IU/mL — ABNORMAL HIGH (ref 0.30–0.70)

## 2016-02-10 LAB — APTT: aPTT: 37 seconds — ABNORMAL HIGH (ref 24–36)

## 2016-02-10 MED ORDER — PERFLUTREN LIPID MICROSPHERE
1.0000 mL | INTRAVENOUS | Status: AC | PRN
  Administered 2016-02-10: 2 mL via INTRAVENOUS
  Filled 2016-02-10: qty 10

## 2016-02-10 MED ORDER — ZOLPIDEM TARTRATE 5 MG PO TABS
5.0000 mg | ORAL_TABLET | Freq: Once | ORAL | Status: AC
  Administered 2016-02-10: 5 mg via ORAL
  Filled 2016-02-10: qty 1

## 2016-02-10 MED ORDER — INFLUENZA VAC SPLIT QUAD 0.5 ML IM SUSY
0.5000 mL | PREFILLED_SYRINGE | INTRAMUSCULAR | Status: AC
  Administered 2016-02-10: 0.5 mL via INTRAMUSCULAR
  Filled 2016-02-10: qty 0.5

## 2016-02-10 MED ORDER — BUDESONIDE 0.25 MG/2ML IN SUSP: 0.2500 mg | Freq: Two times a day (BID) | RESPIRATORY_TRACT | Status: DC

## 2016-02-10 MED ORDER — IRBESARTAN 150 MG PO TABS: 150.0000 mg | ORAL_TABLET | Freq: Every day | ORAL | 0 refills | Status: DC

## 2016-02-10 MED ORDER — PERFLUTREN LIPID MICROSPHERE
INTRAVENOUS | Status: AC
  Filled 2016-02-10: qty 10

## 2016-02-10 MED ORDER — PREGABALIN 100 MG PO CAPS: 100.0000 mg | ORAL_CAPSULE | Freq: Every day | ORAL | 0 refills | Status: DC

## 2016-02-10 MED ORDER — APIXABAN 5 MG PO TABS
5.0000 mg | ORAL_TABLET | Freq: Two times a day (BID) | ORAL | Status: DC
  Administered 2016-02-10: 5 mg via ORAL
  Filled 2016-02-10: qty 1

## 2016-02-10 MED ORDER — ASPIRIN EC 81 MG PO TBEC: 81.0000 mg | DELAYED_RELEASE_TABLET | Freq: Every day | ORAL | Status: DC

## 2016-02-10 MED ORDER — ASPIRIN 81 MG PO TBEC: 81.0000 mg | DELAYED_RELEASE_TABLET | Freq: Every day | ORAL | Status: DC

## 2016-02-10 MED ORDER — INSULIN DETEMIR 100 UNIT/ML FLEXPEN: PEN_INJECTOR | SUBCUTANEOUS | 5 refills | Status: DC

## 2016-02-10 MED ORDER — IRBESARTAN 150 MG PO TABS
150.0000 mg | ORAL_TABLET | Freq: Every day | ORAL | Status: DC
  Administered 2016-02-10: 150 mg via ORAL
  Filled 2016-02-10: qty 1

## 2016-02-10 MED ORDER — NITROGLYCERIN 0.4 MG SL SUBL: 0.4000 mg | SUBLINGUAL_TABLET | SUBLINGUAL | Status: DC | PRN

## 2016-02-10 NOTE — Care Management Note (Signed)
Case Management Note  Patient Details  Name: Casey Harrell MRN: [id number] Date of Birth: [birth year]  Subjective/Objective: Pt presented for Chest Pain. Pt is from home with the husband and support of granddaughter. Pt has DME: RW, WC, [place], [employer] and Sh[employer]. No DME needs at this time.                    Action/Plan: Pt has used [facility] in the past and would like PT Services for Evaluation and Treatment. CM did make referral with Darl PikesSusan and SOC to begin within 24-48 hours post d/c. No RN services needed. No further needs from CM at this time.   Expected Discharge Date:                  Expected Discharge Plan:  Home w Home Health Services  In-House Referral:  NA  Discharge planning Services  CM Consult  Post Acute Care Choice:  Home Health Choice offered to:  Patient, Spouse  DME Arranged:  N/A DME Agency:  NA  HH Arranged:  PT HH Agency:  [facility]  Status of Service:  Completed, signed off  If discussed at Long Length of Stay Meetings, dates discussed:    Additional Comments:  Gala LewandowskyGraves-Bigelow, Geryl Dohn Kaye, RN 02/10/2016, 1:49 PM

## 2016-02-10 NOTE — Plan of Care (Signed)
Problem: Consults Goal: Chest Pain Patient Education (See Patient Education module for education specifics.) Outcome: Completed/Met Date Met: 02/10/16 Pt educated on chest pain symptoms and management   Problem: Phase I Progression Outcomes Goal: Anginal pain relieved Outcome: Completed/Met Date Met: 02/10/16 Pt has been chest pain free  Problem: Phase II Progression Outcomes Goal: Stress Test if indicated Outcome: Not Applicable Date Met: 76/80/88 Pt will be doing stress test out pt  Problem: Phase III Progression Outcomes Goal: Discharge plan remains appropriate-arrangements made Outcome: Completed/Met Date Met: 02/10/16 Pt will be discharged home today after echo results Goal: Tolerating diet Outcome: Completed/Met Date Met: 02/10/16 Pt able to tolerate a heart healthy/ carb mod diet

## 2016-02-10 NOTE — Progress Notes (Signed)
  Echocardiogram 2D Echocardiogram with Definity has been performed.  Nolon RodBrown, Tony 02/10/2016, 1:39 PM

## 2016-02-10 NOTE — Care Management Obs Status (Signed)
MEDICARE OBSERVATION STATUS NOTIFICATION   Patient Details  Name: Casey Harrell MRN: [id number] Date of Birth: 08/23/1934   Medicare Observation Status Notification Given:  Yes    Gala LewandowskyGraves-Bigelow, Mailin Coglianese Kaye, RN 02/10/2016, 1:46 PM

## 2016-02-10 NOTE — Progress Notes (Addendum)
ANTICOAGULATION CONSULT NOTE - Follow up Consult  Pharmacy Consult for heparin D/c --> Eliquis  Indication: atrial fibrillation  Allergies  Allergen Reactions  . Doxycycline Hyclate Nausea And Vomiting  . Morphine Sulfate Other (See Comments)    REACTION: hallucinations  . Iohexol Hives     Code: HIVES, Desc: pt. states she breaks out in hives 06/15/08   . Moxifloxacin Other (See Comments)    REACTION: unknown    Patient Measurements: Height: 5' (152.4 cm) Weight: 138 lb 14.4 oz (63 kg) IBW/kg (Calculated) : 45.5 Heparin Dosing Weight: 60kg  Vital Signs: Temp: 97.8 F (36.6 C) (10/05 0503) Temp Source: Oral (10/05 0503) BP: 152/86 (10/05 0503) Pulse Rate: 93 (10/05 0503)  Labs:  Recent Labs  02/09/16 2117 02/10/16 0125 02/10/16 0530  HGB 12.3  --  13.0  HCT 37.8  --  40.3  PLT 255  --  264  APTT  --   --  37*  HEPARINUNFRC  --   --  >2.20*  CREATININE 0.95  --   --   TROPONINI  --  <0.03 <0.03    Estimated Creatinine Clearance: 40.4 mL/min (by C-G formula based on SCr of 0.95 mg/dL).   Medical History: Past Medical History:  Diagnosis Date  . ASTHMA 07/15/2008  . COPD (chronic obstructive pulmonary disease) (HCC)   . DIABETES-TYPE 2 07/15/2008  . DIABETIC PERIPHERAL NEUROPATHY 09/02/2009  . GERD 07/15/2008  . HYPERTENSION 07/15/2008  . Normal cardiac stress test    low risk Nuc 2009, Feb 2011  . Paroxysmal atrial fibrillation (HCC)   . PVD (peripheral vascular disease) (HCC) 06/18/09   RCE  . Stroke (HCC)   . Vertigo     Assessment: 81yo female c/o CP associated w/ N/V, relieved w/ Zofran and ASA. Pt was initially transition from Eliquis to heparin for Afib in anticipation of procedure; last dose of Eliquis was 10/4 at 2000. Heparin planned to start 12 hours after last PTA Eliquis dose; however, Heparin was never started as cardiac etiology ruled out - likely GI etiology per note. Also starting on ASA 81 mg po BID per Dr. Tresa EndoKelly for h/o stroke and PVD.    PTA eliquis 5 mg BID. SCr 0.95, weight 63 kg, age 81. CBC stable, no s/sx bleeding.     Goal of Therapy:  Heparin level 0.3-0.7 units/ml aPTT 66-102 seconds Monitor platelets by anticoagulation protocol: Yes  Plan:  D/c heparin order (never given to patient) Restart PTA Eliquis 5 mg po BID Monitor SCr, CBC, weight, s/sx bleeding daily  Allena Katzaroline E Welles, Pharm.D. PGY1 Pharmacy Resident 10/5/201711:02 AM Pager [phone]

## 2016-02-10 NOTE — Consult Note (Signed)
CARDIOLOGY CONSULT NOTE   Patient ID: Casey Harrell MRN: [id number] DOB/AGE: July 23, 1934 81 y.o.  Admit date: 02/09/2016  Primary Physician   [name], MD Primary Cardiologist   Dr. Allyson Sabal Reason for Consultation  CP Requesting Physician  Dr. Benjamine Mola  HPI: Casey Harrell is a 81 y.o. female with a history of HTN, HLD, DM, Stroke, PAF on Eliquis, stroke, COPD, carotid artery disease s/p R ECA who presented by EMS for CP.   She had a negative Myoview February 2011. Carotid Dopplers May 26, 2009, showed high-grade right ICA stenosis.  Dr. Durene Cal performed an uncomplicated right carotid endarterectomy. Her most recent carotid Doppler 03/2015 studies performed in the hospital setting of recent stroke showed widely patent endarterectomy sites. F/u event monitor 06/2015 showed PAF and started on Eliquis. ASA and plavix stopped.   She was in [facility] up until yesterday's evening 02/09/16 which washing dishes she had episode of chest pain. She ate usual dinner. She described pain as pressure like at substernal area associated with nausea and vomiting. No dyspnea, diaphoresis or radiation of pain. EMS called and given 4mg  of Zofran and 324 ASA with resolution of cp. No reoccurrence of pain. Her BP was elevated to brought here for further evaluation.   Last dose of Eliquis last night prior to presentation. Troponin  Negative x 2. WBC 12. CXR clear. EKG showed sinus rhythm with non specific T wave changes in lateral leads. No acute changes.   Recently elevated BP for the past 3 weeks, highest  BP of 224/84 at home. 182/45 here. Denies orthopnea, PND, syncope, LE edema, melena or blood in her stool.    Past Medical History:  Diagnosis Date  . ASTHMA 07/15/2008  . COPD (chronic obstructive pulmonary disease) (HCC)   . DIABETES-TYPE 2 07/15/2008  . DIABETIC PERIPHERAL NEUROPATHY 09/02/2009  . GERD 07/15/2008  . HYPERTENSION 07/15/2008  . Normal cardiac stress test    low risk Nuc 2009, Feb 2011  .  Paroxysmal atrial fibrillation (HCC)   . PVD (peripheral vascular disease) (HCC) 06/18/09   RCE  . Stroke (HCC)   . Vertigo      Past Surgical History:  Procedure Laterality Date  . ABDOMINAL HYSTERECTOMY  1971  . APPENDECTOMY  1971  . CAROTID ENDARTERECTOMY  06/18/09   RCE  . CESAREAN SECTION    . colectomy and colostomy    . FLEXIBLE SIGMOIDOSCOPY     diverticulitis  . FOOT SURGERY     right foot X 2  . LAPAROTOMY     X 3 with adhesions lysis  . TONSILLECTOMY    . TUBAL LIGATION      Allergies  Allergen Reactions  . Doxycycline Hyclate Nausea And Vomiting  . Morphine Sulfate Other (See Comments)    REACTION: hallucinations  . Iohexol Hives     Code: HIVES, Desc: pt. states she breaks out in hives 06/15/08   . Moxifloxacin Other (See Comments)    REACTION: unknown    I have reviewed the patient's current medications . aspirin EC  325 mg Oral Daily  . atorvastatin  40 mg Oral q1800  . [START ON 02/11/2016] budesonide  0.25 mg Nebulization BID  . carvedilol  6.25 mg Oral BID WC  . cholecalciferol  2,000 Units Oral Daily  . DULoxetine  30 mg Oral BID  . [START ON 02/11/2016] Influenza vac split quadrivalent PF  0.5 mL Intramuscular Tomorrow-1000  . insulin aspart  0-9 Units Subcutaneous TID WC  .  insulin detemir  15 Units Subcutaneous BID  . irbesartan  75 mg Oral Daily  . levothyroxine  50 mcg Oral QAC breakfast  . pantoprazole  40 mg Oral BID  . pregabalin  150 mg Oral Daily   . heparin     acetaminophen, albuterol, hydrALAZINE, ipratropium-albuterol, meclizine, nitroGLYCERIN, ondansetron (ZOFRAN) IV  Prior to Admission medications   Medication Sig Start Date End Date Taking? Authorizing Provider  albuterol (ACCUNEB) 1.25 MG/3ML nebulizer solution Take 3 mLs (1.25 mg total) by nebulization every 4 (four) hours as needed for wheezing. Reported on 04/26/2015 05/13/15  Yes Jeralyn Bennett, MD  atorvastatin (LIPITOR) 40 MG tablet Take 1 tablet (40 mg total) by mouth 1  day or 1 dose. Patient taking differently: Take 40 mg by mouth daily.  05/26/15  Yes [name], MD  budesonide (PULMICORT) 0.25 MG/2ML nebulizer solution Take 2 mLs (0.25 mg total) by nebulization 2 (two) times daily. 05/13/15  Yes Jeralyn Bennett, MD  carvedilol (COREG) 6.25 MG tablet Take 1 tablet (6.25 mg total) by mouth 2 (two) times daily with a meal. 05/26/15  Yes [name], MD  Cholecalciferol (VITAMIN D3) 2000 UNITS TABS Take 2,000 Units by mouth daily. Reported on 04/26/2015   Yes Historical Provider, MD  DULoxetine (CYMBALTA) 30 MG capsule Take one capsule by mouth once daily for two week and then increase to two daily Patient taking differently: Take 30 mg by mouth 2 (two) times daily.  07/01/15  Yes [name], MD  ELIQUIS 5 MG TABS tablet TAKE 1 TABLET(5 MG) BY MOUTH TWICE DAILY 12/06/15  Yes Runell Gess, MD  HUMALOG KWIKPEN 100 UNIT/ML KiwkPen INJECT 9-14 UNITS SUB-Q TWICE A DAY WITH A MEAL. 9U BEFORE BREAKFAST AND 14 UNITS AFTER SUPPER 04/21/15  Yes Historical Provider, MD  ipratropium-albuterol (DUONEB) 0.5-2.5 (3) MG/3ML SOLN Take 3 mLs by nebulization every 6 (six) hours as needed (shortness of breath or wheezing). 04/20/15  Yes Evlyn Kanner Love, PA-C  irbesartan (AVAPRO) 75 MG tablet Take 1 tablet (75 mg total) by mouth daily. 08/06/15  Yes Bruce Colletta Maryland, MD  LEVEMIR FLEXTOUCH 100 UNIT/ML Pen INJECT 40 UNITS INTO THE SKIN TWICE DAILY Patient taking differently: INJECT 30 UNITS INTO THE SKIN TWICE DAILY 10/13/15  Yes [name], MD  levothyroxine (SYNTHROID, LEVOTHROID) 50 MCG tablet TAKE 1 TABLET BY MOUTH EVERY DAY BEFORE BREAKFAST 07/21/15  Yes [name], MD  LYRICA 150 MG capsule TAKE 1 CAPSULE BY MOUTH DAILY 11/03/15  Yes [name], MD  meclizine (ANTIVERT) 25 MG tablet Take 1 tablet (25 mg total) by mouth 3 (three) times daily as needed for dizziness. 06/22/15  Yes Marinda Elk, MD  metFORMIN (GLUCOPHAGE) 1000 MG tablet Take 1  tablet (1,000 mg total) by mouth 2 (two) times daily with a meal. 05/26/15  Yes [name], MD  pantoprazole (PROTONIX) 40 MG tablet TAKE 1 TABLET BY MOUTH TWICE DAILY 01/19/16  Yes [name], MD  ondansetron (ZOFRAN ODT) 4 MG disintegrating tablet Take 1 tablet (4 mg total) by mouth every 8 (eight) hours as needed for nausea or vomiting. Patient not taking: Reported on 02/09/2016 04/09/14   Roxy Horseman, PA-C     Social History   Social History  . Marital status: Married    Spouse name: N/A  . Number of children: Y  . Years of education: N/A   Occupational History  . stay at home    Social History Main Topics  .  Smoking status: Never Smoker  . Smokeless tobacco: Never Used  . Alcohol use No  . Drug use: Unknown  . Sexual activity: Not on file   Other Topics Concern  . Not on file   Social History Narrative  . No narrative on file    Family Status  Relation Status  . Mother Deceased  . Father Deceased  . Sister Deceased  . Brother Deceased  . Sister   . Maternal Grandfather   . Paternal Grandfather   . Sister   . Sister    Family History  Problem Relation Age of Onset  . Diabetes Mother   . Arthritis Mother   . Heart disease Mother   . Heart disease Father   . Heart disease Sister   . Heart disease Brother   . Cancer Brother 60    lung cancer  . Celiac disease Sister   . Stroke Maternal Grandfather   . Cancer Paternal Grandfather     lung  . Heart disease Sister   . Asthma Sister        ROS:  Full 14 point review of systems complete and found to be negative unless listed above.  Physical Exam: Blood pressure (!) 152/86, pulse 93, temperature 97.8 F (36.6 C), temperature source Oral, resp. rate 15, height 5' (1.524 m), weight 138 lb 14.4 oz (63 kg), SpO2 96 %.  General: Well developed, well nourished, female in no acute distress  Who is sleepy Head: Eyes PERRLA, No xanthomas. Normocephalic and atraumatic, oropharynx without edema or  exudate.  Lungs: Resp regular and unlabored, CTA. Heart: RRR no s3, s4, or murmurs..   Neck: No carotid bruits. No lymphadenopathy. No  JVD. Abdomen: Bowel sounds present, abdomen soft and non-tender without masses or hernias noted. Msk:  No spine or cva tenderness. No weakness, no joint deformities or effusions. Extremities: No clubbing, cyanosis or edema. DP/PT/Radials 2+ and equal bilaterally. Neuro: Alert and oriented X 3. No focal deficits noted. Psych:  Good affect, responds appropriately Skin: No rashes or lesions noted.  Labs:   Lab Results  Component Value Date   WBC 12.0 (H) 02/10/2016   HGB 13.0 02/10/2016   HCT 40.3 02/10/2016   MCV 87.6 02/10/2016   PLT 264 02/10/2016   No results for input(s): INR in the last 72 hours.  Recent Labs Lab 02/09/16 2117  NA 134*  K 3.6  CL 100*  CO2 25  BUN 22*  CREATININE 0.95  CALCIUM 9.6  GLUCOSE 152*   Magnesium  Date Value Ref Range Status  01/18/2012 1.4 (L) 1.5 - 2.5 mg/dL Final    Recent Labs  16/02/9609/05/17 0125 02/10/16 0530  TROPONINI <0.03 <0.03    Recent Labs  02/09/16 2148  TROPIPOC 0.00   Lab Results  Component Value Date   CHOL 137 04/01/2015   HDL 26 (L) 04/01/2015   LDLCALC 76 04/01/2015   TRIG 174 (H) 04/01/2015    Echo: 03/2015 LV EF: 60% -   65%  ------------------------------------------------------------------- Indications:      CVA 436.  ------------------------------------------------------------------- History:   PMH:   Fever.  Risk factors:  Sleep apnea. Hypertension. Diabetes mellitus.  ------------------------------------------------------------------- Study Conclusions  - Left ventricle: The cavity size was normal. Systolic function was   normal. The estimated ejection fraction was in the range of 60%   to 65%. Wall motion was normal; there were no regional wall   motion abnormalities. Features are consistent with a pseudonormal   left ventricular filling  pattern, with  concomitant abnormal   relaxation and increased filling pressure (grade 2 diastolic   dysfunction). Doppler parameters are consistent with high   ventricular filling pressure. - Aortic valve: Trileaflet; mildly thickened, mildly calcified   leaflets. - Mitral valve: Calcified annulus. There was trivial regurgitation. - Atrial septum: There was increased thickness of the septum,   consistent with lipomatous hypertrophy. - Pulmonic valve: There was trivial regurgitation    Radiology:  Dg Chest 2 View  Result Date: 02/10/2016 CLINICAL DATA:  Chest pain and dyspnea EXAM: CHEST  2 VIEW COMPARISON:  06/20/2015 FINDINGS: The heart is mildly enlarged. The thoracic aorta is not aneurysmal. No pneumonic consolidation, effusion or pneumothorax. No suspicious osseous abnormalities. IMPRESSION: No active cardiopulmonary disease. Electronically Signed   By: Tollie Eth M.D.   On: 02/10/2016 00:07    ASSESSMENT AND PLAN:     1. Chest pain - Likely atypical and Gi in etiology given CP occurred after eating with nausea and vomiting. S/s resolved with IV Zofran. No reoccurrence. She is ruled out. EKG non ischemic. Troponin negative. Differential included uncontrolled blood pressure. No inpatient work up unless severely abnormal echo (pending). Keep NPO until seen by Dr. Tresa Endo. She denies any exertional chest pain or shortness of breath at home.   2. Uncontrolled hypertension - Recently elevated. Highest  BP of 224/84 at home. 182/45 here - Will increase Avapro to 150mg . Continue coreg 6.28mb BID. Titrate further as needed.   3. PAF - Maintaining sinus rhythm. CHADSVASCs score of 9. Continue to hold Eliquis until seen by MD. Hold heparin as well.  4. HLD - 04/01/2015: Cholesterol 137; HDL 26; LDL Cholesterol 76; Triglycerides 174; VLDL 35  - Continue statin. Will get lipid panel.  5. DM with neuropathy - Blood sugar of 200-300s at home. Per primary  6. PVD (peripheral vascular disease)- S/P RCE  06/18/09 - Her most recent carotid Doppler 03/2015 studies performed in the hospital setting of recent stroke showed widely patent endarterectomy sites.  7. Sleep apnea- C-pap intol  Signed: Bhagat,Bhavinkumar, PA 02/10/2016, 8:02 AM Pager [phone]  Co-Sign MD  Patient seen and examined. Agree with assessment and plan.  Casey Harrell is a 81 year old Caucasian female who is followed by Dr. Allyson Sabal.  She has a history of hypertension, hyperlipidemia, diabetes mellitus, peripheral vascular disease and is status post right carotid endarterectomy in 2011.  She has a history of paroxysmal atrial fibrillation and is on eloquence anticoagulation.  Every 2016.  She suffered a CVA.  Subsequent carotid Doppler studies revealed widely patent endarterectomy site.  She has remained relatively active.  Last evening while washing dishes.  She developed episode of chest discomfort.  Previously, she denied any history of exertional symptomatology.  EMS was contacted, she was given Zofran and aspirin with complete resolution of her chest pain. Upon presentation, she was hypertensive and by history, her blood pressure has been elevated at home over the last 3 weeks with the highest being 224/84.  Her ECG on presentation was unremarkable.  She has  not had any recurrent chest pain.  Troponins are negative.  On exam today, her blood pressure still mildly elevated at 152/86.  Pulse is in the 80s.  She is afebrile.  aturations 96%.  Sclerae anicteric.  I did not hear any significant carotid bruit.  Lungs reveal decreased breath sounds at the bases.  She did not have chest wall tenderness to palpation.  Rhythm was regular with a 1 to 2/6 systolic murmur.  Abdomen soft without hepatosplenomegaly.  There was no clubbing, cyanosis or edema.  Neurologic exam was grossly nonfocal.  I suspect her chest pain is nonischemic.  Her blood pressure has been elevated recently and I would recommend titration of irbesartan from 75 mg to at least  150 mg.  She may ultimately require 300 mg daily.  She continues to be on carvedilol at 6.25 mg twice a day.  She is maintaining sinus rhythm without recurrent atrial fibrillation.  Recommend resumption of eliquis for anticoagulation with her cha2ds2score of 9.    Her last echo Doppler study in November 2016 during her evaluation for stroke revealed normal systolic function with grade 2 diastolic dysfunction, aortic valve sclerosis and mitral annular calcification. With her significant cardiac risk factors and PVD, I would recommend nuclear stress testing.  However, I do not feel that this needs to be done in the hospital setting and can be arranged as an outpatient in the near future prior to a follow-up evaluation with Dr. Allyson Sabal.   Lennette Bihari, MD, [facility] 02/10/2016 9:11 AM

## 2016-02-10 NOTE — Discharge Summary (Signed)
Physician Discharge Summary  Casey Harrell VHQ:[id number] DOB: 12/04/1934 DOA: 02/09/2016  PCP: [name], MD  Admit date: 02/09/2016 Discharge date: 02/11/2016   Recommendations for Outpatient Follow-Up:   1. Outpatient stress test 2. Reduced dose of lyrica based on CRCl and [place]  List 3. Needs to find CPAP she can wear 4. Home health  Discharge Diagnosis:   Principal Problem:   Chest pain Active Problems:   Hypothyroidism   PVD (peripheral vascular disease)- S/P RCE 06/18/09   Sleep apnea- C-pap intol   Hypertensive urgency   Type 2 diabetes, uncontrolled, with neuropathy (HCC)   Paroxysmal atrial fibrillation [facility])   Discharge disposition:  Home- home health  Discharge Condition: Improved.  Diet recommendation: Low sodium, heart healthy.  Carbohydrate-modified  Wound care: None.   History of Present Illness:   ACHAIA Harrell is a 81 y.o. female with history of stroke, hypertension, hyperlipidemia, atrial fibrillation, diabetes mellitus presents to the ER because of chest pain. Patient was doing her dishes last evening around 6 PM when patient started developing chest pain with diaphoresis. Patient chest pain was substernal pressure-like nonradiating and lasted for an hour. Denies any shortness of breath or productive cough. EMS was called and patient was given aspirin following which patient's chest pain resolved. In the ER EKG cardiac markers and chest x-ray were unremarkable. Patient did take Apixaban prior to coming. Patient is being admitted for further management of chest pain. Patient is chest pain-free at this time. Patient's blood pressure was found to be elevated on admission. Patient's husband states that patient has been having elevated blood pressure recently.   Hospital Course by Problem:   1. Chest pain echo done and is similar to previous, CE negative, chest pain resolved 2. Hypertension uncontrolled - titration of ARB-- outpatient follow up-- will  need further titration as well as OSA treatment 3. Atrial fibrillation presently in sinus rhythm - chads 2 vasc score is 8. Patient is on apixaban and Coreg.  4. Diabetes mellitus type 2 - resume slight increase of home dose as patient reports higher than normal blood sugars--- per husband, eats a lot of sugary treats 5. History of stroke on apixaban-- reordered home health 6. History of peripheral vascular disease. 7. Hypothyroidism on Synthroid. 8. History of sleep apnea intolerant to CPAP-- needs to find mask that she can tolerate    Medical Consultants:    cards   Discharge Exam:   Vitals:   02/10/16 0503 02/10/16 1137  BP: (!) 152/86 (!) 166/73  Pulse: 93 64  Resp: 15 18  Temp: 97.8 F (36.6 C) 97.7 F (36.5 C)   Vitals:   02/10/16 0003 02/10/16 0016 02/10/16 0503 02/10/16 1137  BP:  (!) 145/56 (!) 152/86 (!) 166/73  Pulse:  76 93 64  Resp:  18 15 18   Temp: 98.3 F (36.8 C) 97.9 F (36.6 C) 97.8 F (36.6 C) 97.7 F (36.5 C)  TempSrc:  Oral Oral Oral  SpO2:  96% 96% 96%  Weight:  63 kg (138 lb 14.4 oz) 63 kg (138 lb 14.4 oz)   Height:  5' (1.524 m)      Gen:  NAD- frail appearing   The results of significant diagnostics from this hospitalization (including imaging, microbiology, ancillary and laboratory) are listed below for reference.     Procedures and Diagnostic Studies:   Dg Chest 2 View  Result Date: 02/10/2016 CLINICAL DATA:  Chest pain and dyspnea EXAM: CHEST  2 VIEW COMPARISON:  06/20/2015 FINDINGS: The heart is mildly enlarged. The thoracic aorta is not aneurysmal. No pneumonic consolidation, effusion or pneumothorax. No suspicious osseous abnormalities. IMPRESSION: No active cardiopulmonary disease. Electronically Signed   By: Tollie Eth M.D.   On: 02/10/2016 00:07     Labs:   Basic Metabolic Panel:  Recent Labs Lab 02/09/16 2117  NA 134*  K 3.6  CL 100*  CO2 25  GLUCOSE 152*  BUN 22*  CREATININE 0.95  CALCIUM 9.6    GFR Estimated Creatinine Clearance: 40.4 mL/min (by C-G formula based on SCr of 0.95 mg/dL). Liver Function Tests: No results for input(s): AST, ALT, ALKPHOS, BILITOT, PROT, ALBUMIN in the last 168 hours. No results for input(s): LIPASE, AMYLASE in the last 168 hours. No results for input(s): AMMONIA in the last 168 hours. Coagulation profile No results for input(s): INR, PROTIME in the last 168 hours.  CBC:  Recent Labs Lab 02/09/16 2117 02/10/16 0530  WBC 10.4 12.0*  HGB 12.3 13.0  HCT 37.8 40.3  MCV 86.7 87.6  PLT 255 264   Cardiac Enzymes:  Recent Labs Lab 02/10/16 0125 02/10/16 0530 02/10/16 1045  TROPONINI <0.03 <0.03 <0.03   BNP: Invalid input(s): POCBNP CBG:  Recent Labs Lab 02/10/16 0026 02/10/16 0738 02/10/16 1135  GLUCAP 100* 170* 248*   D-Dimer No results for input(s): DDIMER in the last 72 hours. Hgb A1c  Recent Labs  02/10/16 0823  HGBA1C 7.7*   Lipid Profile  Recent Labs  02/10/16 0823  CHOL 99  HDL 21*  LDLCALC 48  TRIG 161  CHOLHDL 4.7   Thyroid function studies No results for input(s): TSH, T4TOTAL, T3FREE, THYROIDAB in the last 72 hours.  Invalid input(s): FREET3 Anemia work up No results for input(s): VITAMINB12, FOLATE, FERRITIN, TIBC, IRON, RETICCTPCT in the last 72 hours. Microbiology No results found for this or any previous visit (from the past 240 hour(s)).   Discharge Instructions:   Discharge Instructions    Diet - low sodium heart healthy    Complete by:  As directed    Diet Carb Modified    Complete by:  As directed    Discharge instructions    Complete by:  As directed    Home health PT eval and treat   Increase activity slowly    Complete by:  As directed        Medication List    TAKE these medications   albuterol 1.25 MG/3ML nebulizer solution Commonly known as:  ACCUNEB Take 3 mLs (1.25 mg total) by nebulization every 4 (four) hours as needed for wheezing. Reported on 04/26/2015    aspirin 81 MG EC tablet Take 1 tablet (81 mg total) by mouth daily.   atorvastatin 40 MG tablet Commonly known as:  LIPITOR Take 1 tablet (40 mg total) by mouth 1 day or 1 dose. What changed:  when to take this   budesonide 0.25 MG/2ML nebulizer solution Commonly known as:  PULMICORT Take 2 mLs (0.25 mg total) by nebulization 2 (two) times daily.   carvedilol 6.25 MG tablet Commonly known as:  COREG Take 1 tablet (6.25 mg total) by mouth 2 (two) times daily with a meal.   DULoxetine 30 MG capsule Commonly known as:  CYMBALTA Take one capsule by mouth once daily for two week and then increase to two daily What changed:  how much to take  how to take this  when to take this  additional instructions   ELIQUIS 5 MG Tabs tablet Generic  drug:  apixaban TAKE 1 TABLET(5 MG) BY MOUTH TWICE DAILY   Insulin Detemir 100 UNIT/ML Pen Commonly known as:  LEVEMIR FLEXTOUCH INJECT 30 UNITS INTO THE SKIN TWICE DAILY What changed:  See the new instructions.   ipratropium-albuterol 0.5-2.5 (3) MG/3ML Soln Commonly known as:  DUONEB Take 3 mLs by nebulization every 6 (six) hours as needed (shortness of breath or wheezing).   irbesartan 150 MG tablet Commonly known as:  AVAPRO Take 1 tablet (150 mg total) by mouth daily. What changed:  medication strength  how much to take   levothyroxine 50 MCG tablet Commonly known as:  SYNTHROID, LEVOTHROID TAKE 1 TABLET BY MOUTH EVERY DAY BEFORE BREAKFAST   meclizine 25 MG tablet Commonly known as:  ANTIVERT Take 1 tablet (25 mg total) by mouth 3 (three) times daily as needed for dizziness.   metFORMIN 1000 MG tablet Commonly known as:  GLUCOPHAGE Take 1 tablet (1,000 mg total) by mouth 2 (two) times daily with a meal.   ondansetron 4 MG disintegrating tablet Commonly known as:  ZOFRAN ODT Take 1 tablet (4 mg total) by mouth every 8 (eight) hours as needed for nausea or vomiting.   pantoprazole 40 MG tablet Commonly known as:   PROTONIX TAKE 1 TABLET BY MOUTH TWICE DAILY   pregabalin 100 MG capsule Commonly known as:  LYRICA Take 1 capsule (100 mg total) by mouth daily. What changed:  See the new instructions.   Vitamin D3 2000 units Tabs Take 2,000 Units by mouth daily. Reported on 04/26/2015      Follow-up Information    Nanetta Batty, MD. Go on 03/03/2016.   Specialties:  Cardiology, Radiology Why:  @10am  post hospital Contact information: [address] [place] [address] [phone]        [facility] .   Specialty:  Cardiology Why:  Office will call with stress test time and date. IF you do not head anything in 2 days, give [place] call Contact information: [address] [place] [address] [phone]       [facility] .   Why:  Physical Therapy Contact information: [address] [place] [address] [phone]        [name], MD Follow up in 1 week(s).   Specialty:  Family Medicine Contact information: [address] [place] [address] [phone]            Time coordinating discharge: 35 min  Signed:  Moss Berry Juanetta Gosling   [facility] 02/11/2016, 2:39 PM

## 2016-02-11 ENCOUNTER — Other Ambulatory Visit: Payer: Self-pay | Admitting: Family Medicine

## 2016-02-11 LAB — HEMOGLOBIN A1C
Hgb A1c MFr Bld: 7.7 % — ABNORMAL HIGH (ref 4.8–5.6)
Mean Plasma Glucose: 174 mg/dL

## 2016-02-23 ENCOUNTER — Ambulatory Visit (INDEPENDENT_AMBULATORY_CARE_PROVIDER_SITE_OTHER): Payer: Medicare Other | Admitting: Neurology

## 2016-02-23 ENCOUNTER — Encounter: Payer: Self-pay | Admitting: Neurology

## 2016-02-23 VITALS — BP 121/73 | HR 68 | Ht 61.0 in | Wt 138.0 lb

## 2016-02-23 DIAGNOSIS — I10 Essential (primary) hypertension: Secondary | ICD-10-CM

## 2016-02-23 DIAGNOSIS — H8110 Benign paroxysmal vertigo, unspecified ear: Secondary | ICD-10-CM

## 2016-02-23 DIAGNOSIS — E785 Hyperlipidemia, unspecified: Secondary | ICD-10-CM | POA: Diagnosis not present

## 2016-02-23 DIAGNOSIS — I480 Paroxysmal atrial fibrillation: Secondary | ICD-10-CM | POA: Diagnosis not present

## 2016-02-23 DIAGNOSIS — Z794 Long term (current) use of insulin: Secondary | ICD-10-CM | POA: Diagnosis not present

## 2016-02-23 DIAGNOSIS — E1159 Type 2 diabetes mellitus with other circulatory complications: Secondary | ICD-10-CM

## 2016-02-23 DIAGNOSIS — I63512 Cerebral infarction due to unspecified occlusion or stenosis of left middle cerebral artery: Secondary | ICD-10-CM

## 2016-02-23 NOTE — Patient Instructions (Signed)
-   continue eliquis, ASA and lipitor for stroke prevention - follow up with cardiology Dr. Allyson SabalBerry and discuss about ASA use. I am OK with combination of eliquis and ASA if necessary - check glucose at home and record - check BP at home, goal BP 120-150, avoid low BP due to left M1 and b/l PCA high grade stenosis - Follow up with your primary care physician for stroke risk factor modification. Recommend maintain blood pressure goal <130/80, diabetes with hemoglobin A1c goal below 6.5% and lipids with LDL cholesterol goal below 70 mg/dL.  - vertigo maneuver if recurrent vertigo - follow up as needed.

## 2016-02-23 NOTE — Progress Notes (Signed)
STROKE NEUROLOGY FOLLOW UP NOTE  NAME: Casey Harrell DOB: [birth year]  REASON FOR VISIT: stroke follow up HISTORY FROM: pt and chart  Today we had the pleasure of seeing Casey Harrell in follow-up at our Neurology Clinic. Pt was accompanied by granddaughter.   History Summary Casey Harrell is a 81 y.o. female with history of DM, hypertensilon, asthma, PVD, COPD was admitted on 03/31/15 for agitation and increased confusion following diarrhea x 2 days at home. MRI showed small patchy left MCA infarcts with remote bilateral occipital infarcts, embolic pattern. MRA showed segmental L M1 stenosis as well as focal severe stenoses L V4 segment, proximal R SCA, and mid L P2 segment. CUS and TTE unremarkable. LDL 76 and A1C 9.0. EEG no seizure. Mental status improved overnight, considering encephalopathy due to multiple factors, such as stroke, dehydration, diarrhea and hypertensive urgency. On 04/02/15 pt was sitting after PT/OT session, developed AMS, aphasia, right UE and LE weakness. Found to have low BP at 80s to 90s. Lying her flat in bed, gave IV bolus, albumin 4 doses. Stat CT no bleeding and MRI showed patchy extension of left MCA infarcts, consistent with low BP in the setting of left M1 stenosis. Her aphasia resolved but she still has right hemiparesis although with some improvement. She was put on dual antiplatelet and lipitor, recommended 30 day monitoring. She was discharged to CIR after stabilization.  05/07/2015 admission - to [facility] for abdominal pain associate with nausea and vomiting. CT scan of abdomen and pelvis revealed partial small bowel obstruction. She was put on NPO and NG tube. Patient showed improvement over the subsequent days with resolution of small bowel obstruction. Her diet was slowly advanced. Follow-up imaging studies included abdominal x-ray on 05/11/2015 that did not show air-fluid levels or dilatation suggestive of obstruction. NG tube was discontinued. By 05/13/2015 she was  tolerating by mouth intake. She was discharged to home in stable condition.  05/20/15 follow up- the patient has been doing significantly better. She stayed in CIR for 2 weeks and discharged home with improvement of right UE and LE strength. She has HH PT/OT/speech and her right LE strength close to baseline and still has mild weakness at right UE. Able to walk without device. No speech difficulties. BP today 157/79. Not have 30 day monitoring yet.  08/24/15 follow up - pt has been doing well from stroke standpoint. 30 day cardiac monitoring showed pAfib, she followed with Dr. Allyson Sabal and her DAPT was changed to eliquis. She is on eliquis and lipitor now without side effect. However, she was admitted on 06/20/15 for vertigo and MRI negative for stroke. Diagnosed with BPPV. She was discharged with home PT and received maneuver for BPPV and her symptoms resolved. She is walking much better and no need any cane or walker now. Glucose ranging from 100-170 and BP today 144/78. She has appointment with PCP in 2 months.  Interval History During the interval time, pt has been doing well from stroke standpoint. She had a recent admission for chest pain, however workup without ACS. She was discharged with aspirin 81 in addition to eliquis. She has outpatient follow-up with cardiology in 10 days. Family has question about concomitant  use of eliquis and aspirin. BP in good control, today 121/73. Glucose is fluctuating 140s to 170s.  REVIEW OF SYSTEMS: Full 14 system review of systems performed and notable only for those listed below and in HPI above, all others are negative:  Constitutional:  Cardiovascular:  Ear/Nose/Throat:   Skin:  Eyes:   Respiratory:   Gastroitestinal:   Genitourinary:  Hematology/Lymphatic:   Endocrine:  Musculoskeletal:   Allergy/Immunology:   Neurological:   Psychiatric:  Sleep:   The following represents the patient's updated allergies and side effects list: Allergies  Allergen  Reactions  . Doxycycline Hyclate Nausea And Vomiting  . Morphine Sulfate Other (See Comments)    REACTION: hallucinations  . Iohexol Hives     Code: HIVES, Desc: pt. states she breaks out in hives 06/15/08   . Moxifloxacin Other (See Comments)    REACTION: unknown    The neurologically relevant items on the patient's problem list were reviewed on today's visit.  Neurologic Examination  A problem focused neurological exam (12 or more points of the single system neurologic examination, vital signs counts as 1 point, cranial nerves count for 8 points) was performed.  Blood pressure 121/73, pulse 68, height 5\' 1"  (1.549 m), weight 138 lb (62.6 kg).  General - Well nourished, well developed, in no apparent distress.  Ophthalmologic - Fundi not visualized due to eye movement.  Cardiovascular - Regular rate and rhythm.  Mental Status -  Level of arousal and orientation to time, place, and person were intact. Language including expression, naming, repetition, comprehension was assessed and found intact. Fund of Knowledge was assessed and was intact.  Cranial Nerves II - XII - II - Visual field intact OU. III, IV, VI - Extraocular movements intact. V - Facial sensation intact bilaterally. VII - Facial movement intact bilaterally. VIII - Hearing & vestibular intact bilaterally. X - Palate elevates symmetrically. XI - Chin turning & shoulder shrug intact bilaterally. XII - Tongue protrusion intact.  Motor Strength - The patient's strength was normal in all extremities.  Bulk was normal and fasciculations were absent.   Motor Tone - Muscle tone was assessed at the neck and appendages and was normal.  Reflexes - The patient's reflexes were 1+ in all extremities and she had no pathological reflexes.  Sensory - Light touch, temperature/pinprick were assessed and were normal.    Coordination - The patient without dysmetria or ataxia bilaterally.  Tremor was absent.  Gait and Station -  walk without device, slow but steady.  Data reviewed: I personally reviewed the images and agree with the radiology interpretations.  Dg Chest 2 View 03/31/2015 Cardiomegaly without acute process. No interval change.   Ct Head Wo Contrast 03/31/2015 Findings concerning for recent/ possibly acute infarct in the medial posterior right occipital lobe. Prior infarct posterior left occipital lobe. There is atrophy with patchy small vessel disease in the periventricular white matter bilaterally. No hemorrhage or mass effect. Sinusitis with air-fluid level in the right maxillary antrum.   MRI HEAD  04/01/2015 IMPRESSION: 1. Small patchy acute ischemic infarcts involving the left periatrial white matter and left temporoparietal region. 2. Remote bilateral occipital infarcts, left greater than right, with scattered remote lacunar infarcts as above. 3. Cerebral atrophy with moderate chronic small vessel ischemic disease.  MRA HEAD  04/01/2015 IMPRESSION: 1. No large or proximal arterial branch occlusion. 2. Focal high-grade stenosis within the proximal left M1 segment. 3. Extensive atheromatous throughout the posterior circulation, with focal severe stenoses within the left V4 segment, proximal right SCA, and mid left P2 segment.  CT head without contrast 04/02/2015 Bilateral remote occipital lobe infarcts with chronic involutional change. Known small patchy acute infarcts left hemisphere seen on MRI performed 04/01/2015 not well appreciated on CT.  MRI HEAD without contrast  04/02/2015 1. Interval extension in the recently identified left periatrial and temporoparietal infarcts, with multiple scattered new ischemic infarcts involving the left temporoparietal region and left occipital lobe. This most likely resulted from recent hypotensive episode in the setting of known high-grade stenoses in the left M1 and/or P2 segments as seen on recent MRA. 2. Otherwise stable appearance of the  brain.  EEG - This EEG is abnormal with mild generalized nonspecific continuous slowing of cerebral activity. No evidence of an epileptic disorder was demonstrated.  CUS - Bilateral: 1-39% ICA stenosis. Vertebral artery flow is antegrade.  TTE - - Left ventricle: The cavity size was normal. Systolic function was normal. The estimated ejection fraction was in the range of 60% to 65%. Wall motion was normal; there were no regional wall motion abnormalities. Features are consistent with a pseudonormal left ventricular filling pattern, with concomitant abnormal relaxation and increased filling pressure (grade 2 diastolic dysfunction). Doppler parameters are consistent with high ventricular filling pressure. - Aortic valve: Trileaflet; mildly thickened, mildly calcified leaflets. - Mitral valve: Calcified annulus. There was trivial regurgitation. - Atrial septum: There was increased thickness of the septum, consistent with lipomatous hypertrophy. - Pulmonic valve: There was trivial regurgitation.  MRI brain/MRA head/MRA neck 06/21/15 MRI HEAD: No acute intracranial process, specifically no acute ischemia. Stable chronic changes including moderate to severe chronic small vessel ischemic disease. Bilateral occipital encephalomalacia compatible with old PCA territory infarct. MRA HEAD: Mild motion degraded examination. No emergent large vessel occlusion. Stable appearance, including high-grade stenosis LEFT M1 segment; high-grade stenosis proximal LEFT P2 segment with moderate tandem stenosis bilateral posterior cerebral arteries. High-grade stenosis LEFT V4 segment. MRA NECK: Moderately motion degraded examination. High-grade stenosis LEFT vertebral artery. Patent carotid arteries.  30 day cardiac event monitoring - episodes of pAfib  2-D echo 02/10/2016 Left ventricle: Moderat basal septal hypertrophy with sigmoid   septum. The cavity size was normal. Systolic function was  normal.   The estimated ejection fraction was in the range of 60% to 65%.   Wall motion was normal; there were no regional wall motion   abnormalities. Doppler parameters are consistent with both   elevated ventricular end-diastolic filling pressure and elevated   left atrial filling pressure. - Left atrium: The atrium was mildly dilated. - Atrial septum: No defect or patent foramen ovale was identified.   Component     Latest Ref Rng 12/25/2014 03/25/2015  Total Bilirubin     0.2 - 1.2 mg/dL  0.4  Bilirubin, Direct     0.0 - 0.3 mg/dL  0.1  Alkaline Phosphatase     39 - 117 U/L  87  AST     0 - 37 U/L  29  ALT     0 - 35 U/L  22  Total Protein     6.0 - 8.3 g/dL  7.2  Albumin     3.5 - 5.2 g/dL  4.0  Cholesterol     0 - 200 mg/dL  93  Triglycerides     0.0 - 149.0 mg/dL  161.0 (H)  HDL Cholesterol     >39.00 mg/dL  96.04 (L)  VLDL     0.0 - 40.0 mg/dL  54.0 (H)  Total CHOL/HDL Ratio       5  NonHDL       72.56  TSH     0.35 - 4.50 uIU/mL 2.88 1.78  Hemoglobin A1C     4.6 - 6.5 % 8.7 (H) 9.0 (H)  Direct LDL  47.0   Component     Latest Ref Rng & Units 02/10/2016  Cholesterol     0 - 200 mg/dL 99  Triglycerides     <811 mg/dL 914  HDL Cholesterol     >40 mg/dL 21 (L)  Total CHOL/HDL Ratio     RATIO 4.7  VLDL     0 - 40 mg/dL 30  LDL (calc)     0 - 99 mg/dL 48  Hemoglobin N8G     4.8 - 5.6 % 7.7 (H)  Mean Plasma Glucose     mg/dL 956    Assessment: As you may recall, she is a 81 y.o. Caucasian female with PMH of DM, HTN, asthma, PVD, COPD was admitted on 03/31/15 for AMS following diarrhea at home. MRI showed small patchy left MCA infarcts with remote bilateral occipital infarcts, embolic pattern but could be due to multiple intracranial stenosis in MRA which showed segmental L M1 stenosis as well as focal severe stenoses L V4 segment, proximal R SCA, and mid L P2 segment. CUS and TTE unremarkable. LDL 76 and A1C 9.0. EEG no seizure. Mental status  improved overnight, considering encephalopathy due to multiple factors, such as stroke, dehydration, diarrhea and hypertensive urgency. On 04/02/15 pt developed orthostatic hypotension with neuro changes, MRI showed patchy extension of left MCA infarcts, consistent with low BP in the setting of left M1 stenosis. Received fluid resuscitation, her aphasia resolved but she still has right hemiparesis. She was put on dual antiplatelet and lipitor, recommended 30 day monitoring. She was discharged to CIR after stabilization. During the interval time, 30 day cardiac event monitoring showed afib. She was put on eliquis. She was also admitted for BPPV in 06/2015 and resolved after maneuver training. MRI and MRA at that time showed no acute stroke and left M1, b/l PCA stenosis stable. She had dramatic recovery from stroke and no residue deficit. Had recent admission for chest pain, ruled out ACS, put on aspirin.  Plan:  - continue eliquis, ASA and lipitor for stroke for now - follow up with cardiology Dr. Allyson Sabal and discuss about ASA use. I am OK with combination of eliquis and ASA if necessary - check glucose at home and record - check BP at home, goal BP 120-150, avoid low BP due to left M1 and b/l PCA high grade stenosis - Follow up with your primary care physician for stroke risk factor modification. Recommend maintain blood pressure goal <130/80, diabetes with hemoglobin A1c goal below 6.5% and lipids with LDL cholesterol goal below 70 mg/dL.  - vertigo maneuver if recurrent vertigo - follow up as needed.   I spent more than 25 minutes of face to face time with the patient. Greater than 50% of time was spent in counseling and coordination of care. We have discussed about concomitant use of aspirin and eliquis if necessary, medication compliance and better diabetes management.   No orders of the defined types were placed in this encounter.   Meds ordered this encounter  Medications  . irbesartan  (AVAPRO) 75 MG tablet  . LYRICA 150 MG capsule    There are no Patient Instructions on file for this visit.  Marvel Plan, MD PhD [facility] [address] [place], [place] [address] [phone]

## 2016-02-25 ENCOUNTER — Telehealth: Payer: Self-pay

## 2016-02-25 ENCOUNTER — Ambulatory Visit (INDEPENDENT_AMBULATORY_CARE_PROVIDER_SITE_OTHER): Payer: Medicare Other | Admitting: Family Medicine

## 2016-02-25 VITALS — BP 140/80 | HR 73 | Temp 98.3°F | Ht 61.0 in | Wt 138.2 lb

## 2016-02-25 DIAGNOSIS — Z794 Long term (current) use of insulin: Secondary | ICD-10-CM

## 2016-02-25 DIAGNOSIS — E1159 Type 2 diabetes mellitus with other circulatory complications: Secondary | ICD-10-CM | POA: Diagnosis not present

## 2016-02-25 DIAGNOSIS — R079 Chest pain, unspecified: Secondary | ICD-10-CM | POA: Diagnosis not present

## 2016-02-25 DIAGNOSIS — I10 Essential (primary) hypertension: Secondary | ICD-10-CM

## 2016-02-25 MED ORDER — IRBESARTAN 150 MG PO TABS: 150.0000 mg | ORAL_TABLET | Freq: Every day | ORAL | 3 refills | Status: DC

## 2016-02-25 MED ORDER — PREGABALIN 100 MG PO CAPS: 100.0000 mg | ORAL_CAPSULE | Freq: Every day | ORAL | 3 refills | Status: DC

## 2016-02-25 NOTE — Telephone Encounter (Signed)
Spoke with pt's husband (EC) and advised that cardiac scan has been scheduled for 03/10/16 @ 9:30am. He states that he will let her know. Nothing further needed.

## 2016-02-25 NOTE — Progress Notes (Signed)
Subjective:     Patient ID: Casey Harrell, female   DOB: [birth year], 81 y.o.   MRN: [id number]  HPI Patient seen for hospital follow-up. She has multiple chronic problems including type 2 diabetes, hypothyroidism, sleep apnea, hypertension, history of atrial fibrillation, hyperlipidemia, history of recurrent depression, history of cerebrovascular disease.  She was admitted on 02/09/2016 after about a one-hour episode of substernal chest pain while washing dishes. She also developed some diaphoresis. Denied radiation of pain. EMS was called and patient was given aspirin and apparently chest pain resolved in route to ED. Cardiac enzymes were negative. Chest x-ray unremarkable. Blood pressure was apparently elevated on admission. Her Avapro was titrated from 75 to 150 mg daily. Echo was done which apparently showed similar results to previous. She was scheduled for nuclear stress test and looks like fifth of October ordered but they have not heard anything yet. She has follow-up with cardiology next week.  History of atrial fibrillation on Eliquis 5 mg twice daily. Also takes Coreg.   Type 2 diabetes with history of poor compliance. Her granddaughter with her today and states that she is eating lots of carbs. For example, she had dinner on the night of admission with meatloaf, mashed potatoes, macaroni cheese, and rolls. Blood sugars been more stable since discharge. They have home physical therapy coming out for strengthening.  Past Medical History:  Diagnosis Date  . ASTHMA 07/15/2008  . COPD (chronic obstructive pulmonary disease) (HCC)   . DIABETES-TYPE 2 07/15/2008  . DIABETIC PERIPHERAL NEUROPATHY 09/02/2009  . GERD 07/15/2008  . HYPERTENSION 07/15/2008  . Normal cardiac stress test    low risk Nuc 2009, Feb 2011  . Paroxysmal atrial fibrillation (HCC)   . PVD (peripheral vascular disease) (HCC) 06/18/09   RCE  . Stroke (HCC)   . Vertigo    Past Surgical History:  Procedure Laterality Date  .  ABDOMINAL HYSTERECTOMY  1971  . APPENDECTOMY  1971  . CAROTID ENDARTERECTOMY  06/18/09   RCE  . CESAREAN SECTION    . colectomy and colostomy    . FLEXIBLE SIGMOIDOSCOPY     diverticulitis  . FOOT SURGERY     right foot X 2  . LAPAROTOMY     X 3 with adhesions lysis  . TONSILLECTOMY    . TUBAL LIGATION      reports that she has never smoked. She has never used smokeless tobacco. She reports that she does not drink alcohol or use drugs. family history includes Arthritis in her mother; Asthma in her sister; Cancer in her paternal grandfather; Cancer (age of onset: 53) in her brother; Celiac disease in her sister; Diabetes in her mother; Heart disease in her brother, father, mother, sister, and sister; Stroke in her maternal grandfather. Allergies  Allergen Reactions  . Doxycycline Hyclate Nausea And Vomiting  . Morphine Sulfate Other (See Comments)    REACTION: hallucinations  . Iohexol Hives     Code: HIVES, Desc: pt. states she breaks out in hives 06/15/08   . Moxifloxacin Other (See Comments)    REACTION: unknown     Review of Systems  Constitutional: Positive for fatigue. Negative for chills and fever.  Eyes: Negative for visual disturbance.  Respiratory: Negative for cough, chest tightness, shortness of breath and wheezing.   Cardiovascular: Negative for chest pain, palpitations and leg swelling.  Gastrointestinal: Negative for abdominal pain and blood in stool.  Endocrine: Negative for polydipsia and polyuria.  Genitourinary: Negative for dysuria.  Neurological: Negative for  dizziness, seizures, syncope, weakness, light-headedness and headaches.       Objective:   Physical Exam  Constitutional: She is oriented to person, place, and time. She appears well-developed and well-nourished.  Neck: Neck supple. No thyromegaly present.  Cardiovascular: Normal rate and regular rhythm.   Pulmonary/Chest: Effort normal and breath sounds normal. No respiratory distress. She has no  wheezes. She has no rales.  Musculoskeletal: She exhibits no edema.  Neurological: She is alert and oriented to person, place, and time.  Psychiatric: She has a normal mood and affect. Her behavior is normal.       Assessment:     #1 recent singular episode of chest pain in a patient who has multiple risk factors. She has cardiology follow-up pending. She's had no further chest pain episodes whatsoever. Outpatient nuclear stress test has been ordered but they have not heard anything back yet.   We are checking on status of that.  #2 type 2 diabetes with history of peripheral neuropathy. Diabetes relatively stable. Recent A1c 7.7%  #3 hypertension with recent elevation  #4 dyslipidemia  #5 hypothyroidism    Plan:     -Cardiology follow-up as scheduled and will check on status of nuclear stress test which has already been ordered -Continue baby aspirin for now and consult cardiology regarding recommended duration of use -New prescription for increased dosage Avapro 150 mg once daily -Try reducing Lyrica to 100 mg daily-current neuropathy symptoms are stable -Follow with patient and daughter regarding dietary compliance. Try reducing portion sizes and try to avoid meals with multiple carbohydrate options -We'll plan to repeat TSH and A1c at follow-up in 2 months  [name] MD [facility] at [facility]

## 2016-03-01 ENCOUNTER — Other Ambulatory Visit: Payer: Self-pay | Admitting: Family Medicine

## 2016-03-02 ENCOUNTER — Telehealth (HOSPITAL_COMMUNITY): Payer: Self-pay

## 2016-03-02 NOTE — Telephone Encounter (Signed)
Encounter complete. 

## 2016-03-03 ENCOUNTER — Ambulatory Visit (INDEPENDENT_AMBULATORY_CARE_PROVIDER_SITE_OTHER): Payer: Medicare Other | Admitting: Cardiovascular Disease

## 2016-03-03 ENCOUNTER — Encounter: Payer: Self-pay | Admitting: Cardiovascular Disease

## 2016-03-03 DIAGNOSIS — I10 Essential (primary) hypertension: Secondary | ICD-10-CM | POA: Diagnosis not present

## 2016-03-03 DIAGNOSIS — I739 Peripheral vascular disease, unspecified: Secondary | ICD-10-CM

## 2016-03-03 DIAGNOSIS — E785 Hyperlipidemia, unspecified: Secondary | ICD-10-CM

## 2016-03-03 DIAGNOSIS — I200 Unstable angina: Secondary | ICD-10-CM | POA: Diagnosis not present

## 2016-03-03 DIAGNOSIS — I480 Paroxysmal atrial fibrillation: Secondary | ICD-10-CM

## 2016-03-03 NOTE — Patient Instructions (Signed)
Medication Instructions:  Continue current medications.   Testing/Procedures: KEEP YOUR APPT FOR STRESS TEST ON 03/07/16: Your physician has requested that you have a lexiscan myoview. For further information please visit [email or web address] Please follow instruction sheet, as given.    Follow-Up: Your physician wants you to follow-up in: 1 YEAR WITH DR BERRY UNLESS NEEDED PENDING STRESS TEST RESULTS. You will receive a reminder letter in the mail two months in advance. If you don't receive a letter, please call our office to schedule the follow-up appointment.   Any Other Special Instructions Will Be Listed Below (If Applicable). Pharmacologic Stress Electrocardiogram A pharmacologic stress electrocardiogram is a heart (cardiac) test that uses nuclear imaging to evaluate the blood supply to your heart. This test may also be called a pharmacologic stress electrocardiography. Pharmacologic means that a medicine is used to increase your heart rate and blood pressure.  This stress test is done to find areas of poor blood flow to the heart by determining the extent of coronary artery disease (CAD). Some people exercise on a treadmill, which naturally increases the blood flow to the heart. For those people unable to exercise on a treadmill, a medicine is used. This medicine stimulates your heart and will cause your heart to beat harder and more quickly, as if you were exercising.  Pharmacologic stress tests can help determine:  The adequacy of blood flow to your heart during increased levels of activity in order to clear you for discharge home.  The extent of coronary artery blockage caused by CAD.  Your prognosis if you have suffered a heart attack.  The effectiveness of cardiac procedures done, such as an angioplasty, which can increase the circulation in your coronary arteries.  Causes of chest pain or pressure. LET [facility] KNOW ABOUT:  Any allergies you have.  All  medicines you are taking, including vitamins, herbs, eye drops, creams, and over-the-counter medicines.  Previous problems you or members of your family have had with the use of anesthetics.  Any blood disorders you have.  Previous surgeries you have had.  Medical conditions you have.  Possibility of pregnancy, if this applies.  If you are currently breastfeeding. RISKS AND COMPLICATIONS Generally, this is a safe procedure. However, as with any procedure, complications can occur. Possible complications include:  You develop pain or pressure in the following areas:  Chest.  Jaw or neck.  Between your shoulder blades.  Radiating down your left arm.  Headache.  Dizziness or light-headedness.  Shortness of breath.  Increased or irregular heartbeat.  Low blood pressure.  Nausea or vomiting.  Flushing.  Redness going up the arm and slight pain during injection of medicine.  Heart attack (rare). BEFORE THE PROCEDURE   Avoid all forms of caffeine for 24 hours before your test or as directed by your health care provider. This includes coffee, tea (even decaffeinated tea), caffeinated sodas, chocolate, cocoa, and certain pain medicines.  Follow your health care provider's instructions regarding eating and drinking before the test.  Take your medicines as directed at regular times with water unless instructed otherwise. Exceptions may include:  If you have diabetes, ask how you are to take your insulin or pills. It is common to adjust insulin dosing the morning of the test.  If you are taking beta-blocker medicines, it is important to talk to your health care provider about these medicines well before the date of your test. Taking beta-blocker medicines may interfere with the test. In some cases, these medicines  need to be changed or stopped 24 hours or more before the test.  If you wear a nitroglycerin patch, it may need to be removed prior to the test. Ask your health  care provider if the patch should be removed before the test.  If you use an inhaler for any breathing condition, bring it with you to the test.  If you are an outpatient, bring a snack so you can eat right after the stress phase of the test.  Do not smoke for 4 hours prior to the test or as directed by your health care provider.  Do not apply lotions, powders, creams, or oils on your chest prior to the test.  Wear comfortable shoes and clothing. Let your health care provider know if you were unable to complete or follow the preparations for your test. PROCEDURE   Multiple patches (electrodes) will be put on your chest. If needed, small areas of your chest may be shaved to get better contact with the electrodes. Once the electrodes are attached to your body, multiple wires will be attached to the electrodes, and your heart rate will be monitored.  An IV access will be started. A nuclear trace (isotope) is given. The isotope may be given intravenously, or it may be swallowed. Nuclear refers to several types of radioactive isotopes, and the nuclear isotope lights up the arteries so that the nuclear images are clear. The isotope is absorbed by your body. This results in low radiation exposure.  A resting nuclear image is taken to show how your heart functions at rest.  A medicine is given through the IV access.  A second scan is done about 1 hour after the medicine injection and determines how your heart functions under stress.  During this stress phase, you will be connected to an electrocardiogram machine. Your blood pressure and oxygen levels will be monitored. AFTER THE PROCEDURE   Your heart rate and blood pressure will be monitored after the test.  You may return to your normal schedule, including diet,activities, and medicines, unless your health care provider tells you otherwise.   This information is not intended to replace advice given to you by your health care provider. Make  sure you discuss any questions you have with your health care provider.   Document Released: 09/10/2008 Document Revised: 04/29/2013 Document Reviewed: 12/30/2012 Elsevier Interactive Patient Education [employer]     If you need a refill on your cardiac medications before your next appointment, please call your pharmacy.

## 2016-03-03 NOTE — Progress Notes (Signed)
03/03/2016 Casey Harrell   1934/10/26  [id number]  Primary Physician [name], MD Primary Cardiologist: Runell Gess MD [name]  HPI:  The patient is a 81 year old, mildly overweight, married Caucasian female, mother of 2, grandmother to 4 grandchildren who I last saw 06/11/15. Her problems include hypertension, hyperlipidemia and noninsulin-requiring diabetes. She had a negative Myoview February 2011. Carotid Dopplers May 26, 2009, showed high-grade right ICA stenosis. I referred her to Dr. Durene Cal who performed an uncomplicated right carotid endarterectomy. Her most recent carotid Doppler studies performed in the hospital setting of recent stroke showed widely patent endarterectomy sites. Her most recent lipid profile performed 02/10/16 revealed total cholesterol of 99, LDL 48 and HDL of 21..She has had a stroke and was followed by outpatient neurology. She was admitted to the hospital 02/09/16 for 2 days because of chest pain. She ruled out for myocardial infarction. A 2-D echo was normal. She is scheduled for a Myoview stress test next week. She has had no recurrent chest pain.  Current Outpatient Prescriptions  Medication Sig Dispense Refill  . albuterol (ACCUNEB) 1.25 MG/3ML nebulizer solution Take 3 mLs (1.25 mg total) by nebulization every 4 (four) hours as needed for wheezing. Reported on 04/26/2015 75 mL 12  . aspirin EC 81 MG EC tablet Take 1 tablet (81 mg total) by mouth daily.    Marland Kitchen atorvastatin (LIPITOR) 40 MG tablet TAKE 1 TABLET BY MOUTH EVERY DAY 90 tablet 3  . carvedilol (COREG) 6.25 MG tablet Take 1 tablet (6.25 mg total) by mouth 2 (two) times daily with a meal. 180 tablet 2  . Cholecalciferol (VITAMIN D3) 2000 UNITS TABS Take 2,000 Units by mouth daily. Reported on 04/26/2015    . DULoxetine (CYMBALTA) 30 MG capsule Take one capsule by mouth once daily for two week and then increase to two daily (Patient taking differently: Take 30 mg by  mouth 2 (two) times daily. ) 60 capsule 11  . ELIQUIS 5 MG TABS tablet TAKE 1 TABLET(5 MG) BY MOUTH TWICE DAILY 60 tablet 3  . HUMALOG KWIKPEN 100 UNIT/ML KiwkPen INJECT 9-14 UNITS SUBCUTANEOUSLY TWICE DAILY WITH A MEAL.(9UNITS WITH BREAKFAST, 14 UNITS AFTER SUPPER) 45 mL 0  . Insulin Detemir (LEVEMIR FLEXTOUCH) 100 UNIT/ML Pen INJECT 30 UNITS INTO THE SKIN TWICE DAILY 35 mL 5  . ipratropium-albuterol (DUONEB) 0.5-2.5 (3) MG/3ML SOLN Take 3 mLs by nebulization every 6 (six) hours as needed (shortness of breath or wheezing). 360 mL 0  . irbesartan (AVAPRO) 150 MG tablet Take 1 tablet (150 mg total) by mouth daily. 90 tablet 3  . levothyroxine (SYNTHROID, LEVOTHROID) 50 MCG tablet TAKE 1 TABLET BY MOUTH EVERY DAY BEFORE BREAKFAST 90 tablet 2  . meclizine (ANTIVERT) 25 MG tablet Take 1 tablet (25 mg total) by mouth 3 (three) times daily as needed for dizziness. 30 tablet 0  . metFORMIN (GLUCOPHAGE) 1000 MG tablet TAKE 1 TABLET(1000 MG) BY MOUTH TWICE DAILY WITH A MEAL 180 tablet 3  . ondansetron (ZOFRAN ODT) 4 MG disintegrating tablet Take 1 tablet (4 mg total) by mouth every 8 (eight) hours as needed for nausea or vomiting. 10 tablet 0  . pantoprazole (PROTONIX) 40 MG tablet TAKE 1 TABLET BY MOUTH TWICE DAILY 180 tablet 1  . pregabalin (LYRICA) 100 MG capsule Take 1 capsule (100 mg total) by mouth daily. 90 capsule 3   No current facility-administered medications for this visit.     Allergies  Allergen Reactions  .  Doxycycline Hyclate Nausea And Vomiting  . Morphine Sulfate Other (See Comments)    REACTION: hallucinations  . Iohexol Hives     Code: HIVES, Desc: pt. states she breaks out in hives 06/15/08   . Moxifloxacin Other (See Comments)    REACTION: unknown    Social History   Social History  . Marital status: Married    Spouse name: N/A  . Number of children: Y  . Years of education: N/A   Occupational History  . stay at home    Social History Main Topics  . Smoking status:  Never Smoker  . Smokeless tobacco: Never Used  . Alcohol use No  . Drug use: No  . Sexual activity: Not on file   Other Topics Concern  . Not on file   Social History Narrative  . No narrative on file     Review of Systems: General: negative for chills, fever, night sweats or weight changes.  Cardiovascular: negative for chest pain, dyspnea on exertion, edema, orthopnea, palpitations, paroxysmal nocturnal dyspnea or shortness of breath Dermatological: negative for rash Respiratory: negative for cough or wheezing Urologic: negative for hematuria Abdominal: negative for nausea, vomiting, diarrhea, bright red blood per rectum, melena, or hematemesis Neurologic: negative for visual changes, syncope, or dizziness All other systems reviewed and are otherwise negative except as noted above.    Blood pressure 126/70, pulse 61, height 5\' 1"  (1.549 m), weight 139 lb (63 kg).  General appearance: alert and no distress Neck: no adenopathy, no carotid bruit, no JVD, supple, symmetrical, trachea midline and thyroid not enlarged, symmetric, no tenderness/mass/nodules Lungs: clear to auscultation bilaterally Heart: regular rate and rhythm, S1, S2 normal, no murmur, click, rub or gallop Extremities: extremities normal, atraumatic, no cyanosis or edema  EKG not performed today  ASSESSMENT AND PLAN:   Dyslipidemia History of dyslipidemia on statin therapy with recent lipid profile performed 02/10/16 revealed a total cholesterol of 99, LDL 48 and HDL of 21.  Essential hypertension History of hypertension with blood pressure measured today at 126/70. Her Avapro was recently titrated up from 75 mg to 150 mg. She remains on carvedilol. Continue current meds at current dosing  PVD (peripheral vascular disease)- S/P RCE 06/18/09 History of carotid artery disease status post right carotid endarterectomy performed by Dr. Myra Gianotti in 2011.  Chest pain Recent admission June for chest pain/rule out  myocardial infarction with negative enzymes. She scheduled for a Myoview stress test next week. I've asked her to remain on a baby aspirin in addition to her Eliquis . If her stress test is low risk she can stop the aspirin. It shows a perfusion abnormality she will need coronary angiography.  Paroxysmal atrial fibrillation (HCC) History of paroxysmal atrial fibrillation maintaining sinus rhythm on Eliquis oral anticoagulation.      Runell Gess MD FACP,FACC,FAHA, [facility] 03/03/2016 10:38 AM

## 2016-03-03 NOTE — Assessment & Plan Note (Signed)
History of dyslipidemia on statin therapy with recent lipid profile performed 02/10/16 revealed a total cholesterol of 99, LDL 48 and HDL of 21.

## 2016-03-03 NOTE — Assessment & Plan Note (Signed)
History of hypertension with blood pressure measured today at 126/70. Her Avapro was recently titrated up from 75 mg to 150 mg. She remains on carvedilol. Continue current meds at current dosing

## 2016-03-03 NOTE — Assessment & Plan Note (Signed)
History of carotid artery disease status post right carotid endarterectomy performed by Dr. Myra GianottiBrabham in 2011.

## 2016-03-03 NOTE — Assessment & Plan Note (Signed)
History of paroxysmal atrial fibrillation maintaining sinus rhythm on Eliquis oral anticoagulation. 

## 2016-03-03 NOTE — Assessment & Plan Note (Addendum)
Recent admission June for chest pain/rule out myocardial infarction with negative enzymes. She scheduled for a Myoview stress test next week. I've asked her to remain on a baby aspirin in addition to her Eliquis . If her stress test is low risk she can stop the aspirin. It shows a perfusion abnormality she will need coronary angiography.

## 2016-03-07 ENCOUNTER — Ambulatory Visit (HOSPITAL_COMMUNITY)
Admission: RE | Admit: 2016-03-07 | Discharge: 2016-03-07 | Disposition: A | Discharge status: Home / Self Care | Payer: Medicare Other | Source: Ambulatory Visit | Attending: Cardiovascular Disease | Admitting: Cardiovascular Disease

## 2016-03-07 DIAGNOSIS — Z8249 Family history of ischemic heart disease and other diseases of the circulatory system: Secondary | ICD-10-CM | POA: Insufficient documentation

## 2016-03-07 DIAGNOSIS — I4891 Unspecified atrial fibrillation: Secondary | ICD-10-CM | POA: Diagnosis not present

## 2016-03-07 DIAGNOSIS — I739 Peripheral vascular disease, unspecified: Secondary | ICD-10-CM | POA: Insufficient documentation

## 2016-03-07 DIAGNOSIS — G4733 Obstructive sleep apnea (adult) (pediatric): Secondary | ICD-10-CM | POA: Insufficient documentation

## 2016-03-07 DIAGNOSIS — R0789 Other chest pain: Secondary | ICD-10-CM | POA: Insufficient documentation

## 2016-03-07 DIAGNOSIS — E1122 Type 2 diabetes mellitus with diabetic chronic kidney disease: Secondary | ICD-10-CM | POA: Insufficient documentation

## 2016-03-07 DIAGNOSIS — N183 Chronic kidney disease, stage 3 (moderate): Secondary | ICD-10-CM | POA: Diagnosis not present

## 2016-03-07 DIAGNOSIS — J449 Chronic obstructive pulmonary disease, unspecified: Secondary | ICD-10-CM | POA: Diagnosis not present

## 2016-03-07 DIAGNOSIS — I129 Hypertensive chronic kidney disease with stage 1 through stage 4 chronic kidney disease, or unspecified chronic kidney disease: Secondary | ICD-10-CM | POA: Insufficient documentation

## 2016-03-07 LAB — MYOCARDIAL PERFUSION IMAGING
LV dias vol: 67 mL (ref 46–106)
LV sys vol: 23 mL
Peak BP: 16798 mmHg
Peak HR: 84 {beats}/min
Rest HR: 65 {beats}/min
SDS: 1
SRS: 3
SSS: 4
TID: 1.27

## 2016-03-07 MED ORDER — AMINOPHYLLINE 25 MG/ML IV SOLN
75.0000 mg | Freq: Once | INTRAVENOUS | Status: AC
  Administered 2016-03-07: 75 mg via INTRAVENOUS

## 2016-03-07 MED ORDER — TECHNETIUM TC 99M TETROFOSMIN IV KIT
30.1000 | PACK | Freq: Once | INTRAVENOUS | Status: AC | PRN
  Administered 2016-03-07: 30.1 via INTRAVENOUS
  Filled 2016-03-07: qty 31

## 2016-03-07 MED ORDER — REGADENOSON 0.4 MG/5ML IV SOLN
0.4000 mg | Freq: Once | INTRAVENOUS | Status: AC
  Administered 2016-03-07: 0.4 mg via INTRAVENOUS

## 2016-03-07 MED ORDER — TECHNETIUM TC 99M TETROFOSMIN IV KIT
10.0000 | PACK | Freq: Once | INTRAVENOUS | Status: AC | PRN
  Administered 2016-03-07: 10 via INTRAVENOUS
  Filled 2016-03-07: qty 10

## 2016-03-08 ENCOUNTER — Other Ambulatory Visit: Payer: Self-pay | Admitting: Family Medicine

## 2016-03-10 ENCOUNTER — Encounter (HOSPITAL_COMMUNITY): Payer: Medicare Other

## 2016-03-25 NOTE — Telephone Encounter (Signed)
Pt has an appt with PCP on 04/26/2016 but for follow up only not for an AWV.

## 2016-04-07 ENCOUNTER — Other Ambulatory Visit: Payer: Self-pay | Admitting: Cardiovascular Disease

## 2016-04-26 ENCOUNTER — Ambulatory Visit (INDEPENDENT_AMBULATORY_CARE_PROVIDER_SITE_OTHER): Payer: Medicare Other | Admitting: Family Medicine

## 2016-04-26 VITALS — BP 122/80 | HR 61 | Temp 98.2°F | Ht 61.0 in | Wt 138.7 lb

## 2016-04-26 DIAGNOSIS — E1159 Type 2 diabetes mellitus with other circulatory complications: Secondary | ICD-10-CM

## 2016-04-26 DIAGNOSIS — Z794 Long term (current) use of insulin: Secondary | ICD-10-CM

## 2016-04-26 DIAGNOSIS — R300 Dysuria: Secondary | ICD-10-CM | POA: Diagnosis not present

## 2016-04-26 DIAGNOSIS — E039 Hypothyroidism, unspecified: Secondary | ICD-10-CM

## 2016-04-26 DIAGNOSIS — I10 Essential (primary) hypertension: Secondary | ICD-10-CM | POA: Diagnosis not present

## 2016-04-26 LAB — POCT URINALYSIS DIPSTICK
Bilirubin, UA: NEGATIVE
Glucose, UA: NEGATIVE
Ketones, UA: NEGATIVE
Nitrite, UA: NEGATIVE
Spec Grav, UA: 1.01
Urobilinogen, UA: 0.2
pH, UA: 6

## 2016-04-26 LAB — TSH: TSH: 2.17 u[IU]/mL (ref 0.35–4.50)

## 2016-04-26 LAB — HEMOGLOBIN A1C: Hgb A1c MFr Bld: 7.8 % — ABNORMAL HIGH (ref 4.6–6.5)

## 2016-04-26 MED ORDER — CEPHALEXIN 500 MG PO CAPS: 500.0000 mg | ORAL_CAPSULE | Freq: Three times a day (TID) | ORAL | 0 refills | Status: DC

## 2016-04-26 NOTE — Progress Notes (Signed)
Pre visit review using our clinic review tool, if applicable. No additional management support is needed unless otherwise documented below in the visit note. 

## 2016-04-26 NOTE — Patient Instructions (Signed)

## 2016-04-26 NOTE — Progress Notes (Signed)
Subjective:     Patient ID: Casey Harrell, female   DOB: [birth year], 81 y.o.   MRN: [id number]  HPI Patient seen for medical follow-up. She has multiple chronic problems include history of type 2 diabetes, dyslipidemia, atrial fibrillation, hypothyroidism, diabetic peripheral neuropathy, history of cerebrovascular disease. She had recent nuclear stress test back in October which was felt to be low risk with no ischemia.  Diabetes has been challenging to manage. History of poor compliance with diet.  . Last A1c 7.7%. Current medications reviewed. No hypoglycemia. Fasting blood sugars usually ranging 130-150. Due for follow-up TSH.  Relates 2 week history of urine frequency and burning with urination. No fevers or chills. No flank pain. No nausea or vomiting.  Past Medical History:  Diagnosis Date  . ASTHMA 07/15/2008  . COPD (chronic obstructive pulmonary disease) (HCC)   . DIABETES-TYPE 2 07/15/2008  . DIABETIC PERIPHERAL NEUROPATHY 09/02/2009  . GERD 07/15/2008  . HYPERTENSION 07/15/2008  . Normal cardiac stress test    low risk Nuc 2009, Feb 2011  . Paroxysmal atrial fibrillation (HCC)   . PVD (peripheral vascular disease) (HCC) 06/18/09   RCE  . Stroke (HCC)   . Vertigo    Past Surgical History:  Procedure Laterality Date  . ABDOMINAL HYSTERECTOMY  1971  . APPENDECTOMY  1971  . CAROTID ENDARTERECTOMY  06/18/09   RCE  . CESAREAN SECTION    . colectomy and colostomy    . FLEXIBLE SIGMOIDOSCOPY     diverticulitis  . FOOT SURGERY     right foot X 2  . LAPAROTOMY     X 3 with adhesions lysis  . TONSILLECTOMY    . TUBAL LIGATION      reports that she has never smoked. She has never used smokeless tobacco. She reports that she does not drink alcohol or use drugs. family history includes Arthritis in her mother; Asthma in her sister; Cancer in her paternal grandfather; Cancer (age of onset: 67) in her brother; Celiac disease in her sister; Diabetes in her mother; Heart disease in her  brother, father, mother, sister, and sister; Stroke in her maternal grandfather. Allergies  Allergen Reactions  . Doxycycline Hyclate Nausea And Vomiting  . Morphine Sulfate Other (See Comments)    REACTION: hallucinations  . Iohexol Hives     Code: HIVES, Desc: pt. states she breaks out in hives 06/15/08   . Moxifloxacin Other (See Comments)    REACTION: unknown     Review of Systems  Constitutional: Negative for fatigue.  Eyes: Negative for visual disturbance.  Respiratory: Negative for cough, chest tightness, shortness of breath and wheezing.   Cardiovascular: Negative for chest pain, palpitations and leg swelling.  Genitourinary: Positive for dysuria. Negative for flank pain and hematuria.  Neurological: Negative for dizziness, seizures, syncope, weakness, light-headedness and headaches.       Objective:   Physical Exam  Constitutional: She appears well-developed and well-nourished.  Eyes: Pupils are equal, round, and reactive to light.  Neck: Neck supple. No JVD present. No thyromegaly present.  Cardiovascular: Normal rate and regular rhythm.  Exam reveals no gallop.   Pulmonary/Chest: Effort normal and breath sounds normal. No respiratory distress. She has no wheezes. She has no rales.  Musculoskeletal: She exhibits no edema.  Neurological: She is alert.  Skin:  Feet reveal no skin lesions. Good distal foot pulses. Good capillary refill. No calluses. Impaired sensation both feet with monofilament testing        Assessment:     #  1 history of type 2 diabetes. History of poor compliance and poor control  #2 hypertension stable and at goal  #3 dyslipidemia  #4 dysuria of 2 week duration as above  #5 hypothyroidism    Plan:     -Check urinalysis-(pyuria noted) -urine cx sent and start Keflex 500 mg po tid for 7 days -Check hemoglobin A1c and TSH -Flu vaccine already given  [name] MD [facility] at [facility]

## 2016-04-29 LAB — URINE CULTURE

## 2016-05-02 ENCOUNTER — Other Ambulatory Visit: Payer: Self-pay

## 2016-05-02 ENCOUNTER — Telehealth: Payer: Self-pay | Admitting: Family Medicine

## 2016-05-02 MED ORDER — SULFAMETHOXAZOLE-TRIMETHOPRIM 800-160 MG PO TABS: 1.0000 | ORAL_TABLET | Freq: Two times a day (BID) | ORAL | 0 refills | Status: DC

## 2016-05-02 NOTE — Telephone Encounter (Signed)
Pt would like results of labs. Please call back. °

## 2016-05-02 NOTE — Telephone Encounter (Signed)
Pt is aware of results. 

## 2016-05-05 ENCOUNTER — Other Ambulatory Visit: Payer: Self-pay | Admitting: Cardiovascular Disease

## 2016-05-05 ENCOUNTER — Other Ambulatory Visit: Payer: Self-pay | Admitting: Family Medicine

## 2016-05-05 ENCOUNTER — Other Ambulatory Visit: Payer: Self-pay | Admitting: *Deleted

## 2016-05-05 MED ORDER — APIXABAN 5 MG PO TABS: ORAL_TABLET | ORAL | 11 refills | Status: DC

## 2016-05-09 ENCOUNTER — Other Ambulatory Visit: Payer: Self-pay | Admitting: Family Medicine

## 2016-05-11 ENCOUNTER — Other Ambulatory Visit: Payer: Self-pay

## 2016-05-17 ENCOUNTER — Other Ambulatory Visit: Payer: Self-pay

## 2016-05-17 MED ORDER — PREGABALIN 150 MG PO CAPS: ORAL_CAPSULE | ORAL | 1 refills | Status: DC

## 2016-05-27 ENCOUNTER — Other Ambulatory Visit: Payer: Self-pay | Admitting: Family Medicine

## 2016-06-28 ENCOUNTER — Ambulatory Visit (INDEPENDENT_AMBULATORY_CARE_PROVIDER_SITE_OTHER): Payer: Medicare Other | Admitting: Family Medicine

## 2016-06-28 VITALS — BP 140/78 | HR 67 | Ht 61.0 in | Wt 139.0 lb

## 2016-06-28 DIAGNOSIS — R300 Dysuria: Secondary | ICD-10-CM | POA: Diagnosis not present

## 2016-06-28 DIAGNOSIS — R2689 Other abnormalities of gait and mobility: Secondary | ICD-10-CM

## 2016-06-28 DIAGNOSIS — E1159 Type 2 diabetes mellitus with other circulatory complications: Secondary | ICD-10-CM

## 2016-06-28 DIAGNOSIS — I10 Essential (primary) hypertension: Secondary | ICD-10-CM | POA: Diagnosis not present

## 2016-06-28 DIAGNOSIS — Z794 Long term (current) use of insulin: Secondary | ICD-10-CM

## 2016-06-28 LAB — POCT URINALYSIS DIPSTICK
Bilirubin, UA: NEGATIVE
Blood, UA: NEGATIVE
Glucose, UA: NEGATIVE
Nitrite, UA: NEGATIVE
Spec Grav, UA: 1.01
Urobilinogen, UA: 0.2
pH, UA: 5.5

## 2016-06-28 MED ORDER — MECLIZINE HCL 25 MG PO TABS: 25.0000 mg | ORAL_TABLET | Freq: Three times a day (TID) | ORAL | 1 refills | Status: DC | PRN

## 2016-06-28 MED ORDER — ONDANSETRON 4 MG PO TBDP: 4.0000 mg | ORAL_TABLET | Freq: Three times a day (TID) | ORAL | 1 refills | Status: DC | PRN

## 2016-06-28 NOTE — Progress Notes (Signed)
Subjective:     Patient ID: Casey Harrell, female   DOB: 10-17-1934, 82 y.o.   MRN: [id number]  HPI Patient seen for routine medical follow-up and seen for following issues  Dysuria for the past 4-5 days. She had Klebsiella UTI last visit which did eventually improved following Septra. She has burning with urination. No fevers or chills.  Patient has had increasing problems with balance. Denies any recent falls but is very high risk. She also takes chronic anticoagulation secondary to A. fib and is high risk because of that. She has history of advanced peripheral neuropathy. She also has intermittent vertigo which increases her risk. She has increased risk for B12 deficiency because of chronic metformin use and chronic PPI use. No recent B12 levels. Recent TSH normal.  Type 2 diabetes. Last A1c 7.8%. No recent hypoglycemia. She relates recent poor compliance with diet.  Past Medical History:  Diagnosis Date  . ASTHMA 07/15/2008  . COPD (chronic obstructive pulmonary disease) (HCC)   . DIABETES-TYPE 2 07/15/2008  . DIABETIC PERIPHERAL NEUROPATHY 09/02/2009  . GERD 07/15/2008  . HYPERTENSION 07/15/2008  . Normal cardiac stress test    low risk Nuc 2009, Feb 2011  . Paroxysmal atrial fibrillation (HCC)   . PVD (peripheral vascular disease) (HCC) 06/18/09   RCE  . Stroke (HCC)   . Vertigo    Past Surgical History:  Procedure Laterality Date  . ABDOMINAL HYSTERECTOMY  1971  . APPENDECTOMY  1971  . CAROTID ENDARTERECTOMY  06/18/09   RCE  . CESAREAN SECTION    . colectomy and colostomy    . FLEXIBLE SIGMOIDOSCOPY     diverticulitis  . FOOT SURGERY     right foot X 2  . LAPAROTOMY     X 3 with adhesions lysis  . TONSILLECTOMY    . TUBAL LIGATION      reports that she has never smoked. She has never used smokeless tobacco. She reports that she does not drink alcohol or use drugs. family history includes Arthritis in her mother; Asthma in her sister; Cancer in her paternal grandfather;  Cancer (age of onset: 5060) in her brother; Celiac disease in her sister; Diabetes in her mother; Heart disease in her brother, father, mother, sister, and sister; Stroke in her maternal grandfather. Allergies  Allergen Reactions  . Doxycycline Hyclate Nausea And Vomiting  . Morphine Sulfate Other (See Comments)    REACTION: hallucinations  . Iohexol Hives     Code: HIVES, Desc: pt. states she breaks out in hives 06/15/08   . Moxifloxacin Other (See Comments)    REACTION: unknown     Review of Systems  Constitutional: Positive for fatigue. Negative for unexpected weight change.  Eyes: Negative for visual disturbance.  Respiratory: Negative for cough, chest tightness, shortness of breath and wheezing.   Cardiovascular: Negative for chest pain, palpitations and leg swelling.  Endocrine: Negative for polydipsia and polyuria.  Genitourinary: Positive for dysuria. Negative for hematuria.  Neurological: Negative for dizziness, seizures, syncope, weakness, light-headedness and headaches.       Objective:   Physical Exam  Constitutional: She appears well-developed and well-nourished.  Eyes: Pupils are equal, round, and reactive to light.  Neck: Neck supple. No JVD present. No thyromegaly present.  Cardiovascular: Normal rate and regular rhythm.  Exam reveals no gallop.   Pulmonary/Chest: Effort normal and breath sounds normal. No respiratory distress. She has no wheezes. She has no rales.  Musculoskeletal: She exhibits no edema.  Neurological: She is alert.  positive Romberg test       Assessment:     #1 dysuria rule out UTI-only small leukocytes, o/w urine dip normal.  #2 balance disorder. Suspect related her peripheral neuropathy and also some generalized weakness  #3 hypertension stable  #4 type 2 diabetes with history of marginal control and poor compliance    Plan:     -Labs today with hemoglobin A1c, B12, and basic metabolic panel -Set up home physical therapy for  evaluation to reduce fall risk. She needs some strengthening exercises -Check urinalysis-as above and urine cx sent.  [name] MD [facility]

## 2016-06-28 NOTE — Progress Notes (Signed)
Pre visit review using our clinic review tool, if applicable. No additional management support is needed unless otherwise documented below in the visit note. 

## 2016-06-29 LAB — HEMOGLOBIN A1C: Hgb A1c MFr Bld: 7.9 % — ABNORMAL HIGH (ref 4.6–6.5)

## 2016-06-29 LAB — VITAMIN B12: Vitamin B-12: 244 pg/mL (ref 211–911)

## 2016-06-30 LAB — URINE CULTURE

## 2016-07-03 ENCOUNTER — Other Ambulatory Visit: Payer: Self-pay

## 2016-07-03 MED ORDER — CEPHALEXIN 500 MG PO CAPS: 500.0000 mg | ORAL_CAPSULE | Freq: Three times a day (TID) | ORAL | 0 refills | Status: DC

## 2016-07-08 ENCOUNTER — Other Ambulatory Visit: Payer: Self-pay | Admitting: Family Medicine

## 2016-07-10 ENCOUNTER — Telehealth: Payer: Self-pay | Admitting: Family Medicine

## 2016-07-10 NOTE — Telephone Encounter (Signed)
Pt would like blood work results °

## 2016-07-13 NOTE — Telephone Encounter (Signed)
Patient is aware of lab results.

## 2016-07-15 ENCOUNTER — Emergency Department (HOSPITAL_COMMUNITY)
Admission: EM | Admit: 2016-07-15 | Discharge: 2016-07-15 | Disposition: A | Discharge status: Home / Self Care | Payer: Medicare Other | Attending: Emergency Medicine | Admitting: Emergency Medicine

## 2016-07-15 ENCOUNTER — Emergency Department (HOSPITAL_COMMUNITY): Payer: Medicare Other

## 2016-07-15 DIAGNOSIS — Z794 Long term (current) use of insulin: Secondary | ICD-10-CM | POA: Insufficient documentation

## 2016-07-15 DIAGNOSIS — B9789 Other viral agents as the cause of diseases classified elsewhere: Secondary | ICD-10-CM

## 2016-07-15 DIAGNOSIS — E039 Hypothyroidism, unspecified: Secondary | ICD-10-CM | POA: Insufficient documentation

## 2016-07-15 DIAGNOSIS — J069 Acute upper respiratory infection, unspecified: Secondary | ICD-10-CM | POA: Insufficient documentation

## 2016-07-15 DIAGNOSIS — Z7982 Long term (current) use of aspirin: Secondary | ICD-10-CM | POA: Diagnosis not present

## 2016-07-15 DIAGNOSIS — Z7901 Long term (current) use of anticoagulants: Secondary | ICD-10-CM | POA: Insufficient documentation

## 2016-07-15 DIAGNOSIS — R531 Weakness: Secondary | ICD-10-CM | POA: Diagnosis not present

## 2016-07-15 DIAGNOSIS — N183 Chronic kidney disease, stage 3 (moderate): Secondary | ICD-10-CM | POA: Diagnosis not present

## 2016-07-15 DIAGNOSIS — I129 Hypertensive chronic kidney disease with stage 1 through stage 4 chronic kidney disease, or unspecified chronic kidney disease: Secondary | ICD-10-CM | POA: Diagnosis not present

## 2016-07-15 DIAGNOSIS — R404 Transient alteration of awareness: Secondary | ICD-10-CM | POA: Diagnosis not present

## 2016-07-15 DIAGNOSIS — E1122 Type 2 diabetes mellitus with diabetic chronic kidney disease: Secondary | ICD-10-CM | POA: Diagnosis not present

## 2016-07-15 DIAGNOSIS — R0602 Shortness of breath: Secondary | ICD-10-CM | POA: Diagnosis not present

## 2016-07-15 DIAGNOSIS — R05 Cough: Secondary | ICD-10-CM | POA: Diagnosis not present

## 2016-07-15 DIAGNOSIS — J449 Chronic obstructive pulmonary disease, unspecified: Secondary | ICD-10-CM | POA: Insufficient documentation

## 2016-07-15 DIAGNOSIS — Z8673 Personal history of transient ischemic attack (TIA), and cerebral infarction without residual deficits: Secondary | ICD-10-CM | POA: Insufficient documentation

## 2016-07-15 DIAGNOSIS — E1140 Type 2 diabetes mellitus with diabetic neuropathy, unspecified: Secondary | ICD-10-CM | POA: Insufficient documentation

## 2016-07-15 LAB — BASIC METABOLIC PANEL
Anion gap: 9 (ref 5–15)
BUN: 20 mg/dL (ref 6–20)
CO2: 26 mmol/L (ref 22–32)
Calcium: 9.8 mg/dL (ref 8.9–10.3)
Chloride: 104 mmol/L (ref 101–111)
Creatinine, Ser: 0.86 mg/dL (ref 0.44–1.00)
GFR calc Af Amer: >60 mL/min (ref >60)
GFR calc non Af Amer: >60 mL/min (ref >60)
Glucose, Bld: 99 mg/dL (ref 65–99)
Potassium: 3.9 mmol/L (ref 3.5–5.1)
Sodium: 139 mmol/L (ref 135–145)

## 2016-07-15 LAB — CBC
HCT: 39.6 % (ref 36.0–46.0)
Hemoglobin: 13.2 g/dL (ref 12.0–15.0)
MCH: 28.3 pg (ref 26.0–34.0)
MCHC: 33.3 g/dL (ref 30.0–36.0)
MCV: 85 fL (ref 78.0–100.0)
Platelets: 269 10*3/uL (ref 150–400)
RBC: 4.66 MIL/uL (ref 3.87–5.11)
RDW: 14.7 % (ref 11.5–15.5)
WBC: 11.3 10*3/uL — ABNORMAL HIGH (ref 4.0–10.5)

## 2016-07-15 LAB — URINALYSIS, ROUTINE W REFLEX MICROSCOPIC
Bilirubin Urine: NEGATIVE
Glucose, UA: NEGATIVE mg/dL
Hgb urine dipstick: NEGATIVE
Ketones, ur: NEGATIVE mg/dL
Leukocytes, UA: NEGATIVE
Nitrite: NEGATIVE
Protein, ur: NEGATIVE mg/dL
Specific Gravity, Urine: 1.006 (ref 1.005–1.030)
pH: 6 (ref 5.0–8.0)

## 2016-07-15 MED ORDER — GUAIFENESIN 100 MG/5ML PO LIQD: 100.0000 mg | ORAL | 0 refills | Status: DC | PRN

## 2016-07-15 MED ORDER — CARVEDILOL 6.25 MG PO TABS
6.2500 mg | ORAL_TABLET | Freq: Once | ORAL | Status: AC
Start: 2016-07-15 — End: 2016-07-15
  Administered 2016-07-15: 6.25 mg via ORAL
  Filled 2016-07-15: qty 1

## 2016-07-15 MED ORDER — SODIUM CHLORIDE 0.9 % IV BOLUS (SEPSIS)
500.0000 mL | Freq: Once | INTRAVENOUS | Status: AC
  Administered 2016-07-15: 500 mL via INTRAVENOUS

## 2016-07-15 NOTE — ED Triage Notes (Addendum)
Pt comes from home via EMS with complaints of weakness that began today.  Recently had a UTI and finished Keflex 2 days ago.  Since then family reports flu like symptoms.  Denies any other symptoms.  Family states "she just doesn't look right". A&O to her baseline.  Newer onset of Alzheimer's.  CBG 111. 12-lead unremarkable

## 2016-07-15 NOTE — Discharge Instructions (Signed)
Please read and follow all provided instructions.  Your diagnoses today include:  1. Viral URI with cough     You appear to have an upper respiratory infection (URI). An upper respiratory tract infection, or cold, is a viral infection of the air passages leading to the lungs. It should improve gradually after 5-7 days. You may have a lingering cough that lasts for 2- 4 weeks after the infection.  Tests performed today include: Vital signs. See below for your results today.   Medications prescribed:   Take any prescribed medications only as directed. Treatment for your infection is aimed at treating the symptoms. There are no medications, such as antibiotics, that will cure your infection.   Home care instructions:  Follow any educational materials contained in this packet.   Your illness is contagious and can be spread to others, especially during the first 3 or 4 days. It cannot be cured by antibiotics or other medicines. Take basic precautions such as washing your hands often, covering your mouth when you cough or sneeze, and avoiding public places where you could spread your illness to others.   Please continue drinking plenty of fluids.  Use over-the-counter medicines as needed as directed on packaging for symptom relief.  You may also use ibuprofen or tylenol as directed on packaging for pain or fever.  Do not take multiple medicines containing Tylenol or acetaminophen to avoid taking too much of this medication.  Follow-up instructions: Please follow-up with your primary care provider in the next 3 days for further evaluation of your symptoms if you are not feeling better.   Return instructions:  Please return to the Emergency Department if you experience worsening symptoms.  RETURN IMMEDIATELY IF you develop shortness of breath, confusion or altered mental status, a new rash, become dizzy, faint, or poorly responsive, or are unable to be cared for at home. Please return if you have  persistent vomiting and cannot keep down fluids or develop a fever that is not controlled by tylenol or motrin.   Please return if you have any other emergent concerns.  Additional Information:  Your vital signs today were: BP 180/82    Pulse 64    Temp 98 F (36.7 C) (Oral)    Resp 20    SpO2 97%  If your blood pressure (BP) was elevated above 135/85 this visit, please have this repeated by your doctor within one month. --------------

## 2016-07-15 NOTE — ED Provider Notes (Signed)
[facility]-EMERGENCY DEPT Provider Note   CSN: [id number] Arrival date & time: 07/15/16  1649     History   Chief Complaint Chief Complaint  Patient presents with  . Weakness    HPI Casey Harrell is a 82 y.o. female.  HPI  82 y.o. female with a hx of COPD, DM2, HTN, presents to the Emergency Department today complaining of weakness that began today. Recent UTI that was Dx by PCP on 06-28-16. Finished Abx x 2 days ago. Family notes flu-like symptoms. They also note that she 'just doesn't look right.." Denies pain currently. No N/V/D. No CP/SOB/ABD pain. Notes non productive cough. No sinus congestion. Mild rhinorrhea. No fevers. Notes decrease in PO intake recently and decrease in ADLs in well. No other symptoms noted.   Past Medical History:  Diagnosis Date  . ASTHMA 07/15/2008  . COPD (chronic obstructive pulmonary disease) (HCC)   . DIABETES-TYPE 2 07/15/2008  . DIABETIC PERIPHERAL NEUROPATHY 09/02/2009  . GERD 07/15/2008  . HYPERTENSION 07/15/2008  . Normal cardiac stress test    low risk Nuc 2009, Feb 2011  . Paroxysmal atrial fibrillation (HCC)   . PVD (peripheral vascular disease) (HCC) 06/18/09   RCE  . Stroke (HCC)   . Vertigo     Patient Active Problem List   Diagnosis Date Noted  . Chest pain 02/09/2016  . BPPV (benign paroxysmal positional vertigo) 08/25/2015  . HLD (hyperlipidemia) 08/25/2015  . At high risk for falls 07/01/2015  . Vertigo 06/21/2015  . Paroxysmal atrial fibrillation (HCC) 06/11/2015  . Cerebral infarction due to stenosis of left middle cerebral artery (HCC) 05/20/2015  . Intracranial vascular stenosis 05/20/2015  . Type 2 diabetes mellitus with circulatory disorder (HCC) 05/20/2015  . Type 2 diabetes, uncontrolled, with neuropathy (HCC)   . Abdominal pain   . Partial small bowel obstruction   . Benign essential HTN   . Small bowel obstruction 05/07/2015  . Hemiplegia, unspecified affecting right dominant side (HCC) 04/08/2015  . Embolic stroke  involving posterior cerebral artery (HCC) 04/05/2015  . Orthostatic hypotension   . Hypertensive urgency 04/01/2015  . Fever in adult 04/01/2015  . Acute encephalopathy 04/01/2015  . Confusion   . CVA (cerebral infarction)   . Posterior circulation stroke (HCC)   . Acute ischemic stroke (HCC) 03/31/2015  . Stroke (HCC) 03/31/2015  . CKD (chronic kidney disease) stage 3, GFR 30-59 ml/min 04/17/2014  . Gastroenteritis, non-infectious 04/11/2014  . Nausea vomiting and diarrhea 04/10/2014  . Hypokalemia 04/10/2014  . Dehydration 04/09/2014  . Bronchitis with airway obstruction (HCC) 02/03/2013  . Leukocytosis 02/03/2013  . Fever 02/03/2013  . Acute respiratory failure with hypoxia (HCC) 02/03/2013  . Depression 02/03/2013  . Hyponatremia 02/03/2013  . Obesity (BMI 30-39.9) 01/10/2013  . PVD (peripheral vascular disease)- S/P RCE 06/18/09 12/13/2012  . Sleep apnea- C-pap intol 12/13/2012  . Low risk cardiac nuclear stress test- 2009 and Feb 2011 12/13/2012  . Mild persistent asthma 11/29/2011  . Chronic cough 06/05/2011  . Diabetic neuropathy (HCC) 09/02/2009  . Dyslipidemia 07/13/2009  . Hypothyroidism 11/12/2008  . Type 2 diabetes mellitus, uncontrolled (HCC) 07/15/2008  . Essential hypertension 07/15/2008  . GERD 07/15/2008  . DIVERTICULOSIS, COLON 07/15/2008    Past Surgical History:  Procedure Laterality Date  . ABDOMINAL HYSTERECTOMY  1971  . APPENDECTOMY  1971  . CAROTID ENDARTERECTOMY  06/18/09   RCE  . CESAREAN SECTION    . colectomy and colostomy    . FLEXIBLE SIGMOIDOSCOPY  diverticulitis  . FOOT SURGERY     right foot X 2  . LAPAROTOMY     X 3 with adhesions lysis  . TONSILLECTOMY    . TUBAL LIGATION      OB History    No data available       Home Medications    Prior to Admission medications   Medication Sig Start Date End Date Taking? Authorizing Provider  albuterol (ACCUNEB) 1.25 MG/3ML nebulizer solution Take 3 mLs (1.25 mg total) by  nebulization every 4 (four) hours as needed for wheezing. Reported on 04/26/2015 05/13/15   Jeralyn BennettEzequiel Zamora, MD  apixaban (ELIQUIS) 5 MG TABS tablet TAKE 1 TABLET(5 MG) BY MOUTH TWICE DAILY 05/05/16   Runell GessJonathan J Berry, MD  aspirin EC 81 MG EC tablet Take 1 tablet (81 mg total) by mouth daily. 02/11/16   Joseph ArtJessica U Vann, DO  atorvastatin (LIPITOR) 40 MG tablet TAKE 1 TABLET BY MOUTH EVERY DAY 03/01/16   Shelva MajesticStephen O Hunter, MD  carvedilol (COREG) 6.25 MG tablet TAKE 1 TABLET(6.25 MG) BY MOUTH TWICE DAILY WITH A MEAL 03/09/16   Kristian CoveyBruce W Burchette, MD  cephALEXin (KEFLEX) 500 MG capsule Take 1 capsule (500 mg total) by mouth 3 (three) times daily. 07/03/16   Kristian CoveyBruce W Burchette, MD  Cholecalciferol (VITAMIN D3) 2000 UNITS TABS Take 2,000 Units by mouth daily. Reported on 04/26/2015    Historical Provider, MD  DULoxetine (CYMBALTA) 30 MG capsule TAKE ONE CAPSULE BY MOUTH EVERY DAY FOR 2 WEEKS THE INCREASE TO 1 CAPSULE TWICE DAILY 07/11/16   Kristian CoveyBruce W Burchette, MD  HUMALOG KWIKPEN 100 UNIT/ML KiwkPen INJECT 9-14 UNITS SUBCUTANEOUSLY TWICE DAILY WITH A MEAL.(9UNITS WITH BREAKFAST, 14 UNITS AFTER SUPPER) 02/11/16   Kristian CoveyBruce W Burchette, MD  ipratropium-albuterol (DUONEB) 0.5-2.5 (3) MG/3ML SOLN Take 3 mLs by nebulization every 6 (six) hours as needed (shortness of breath or wheezing). 04/20/15   Evlyn KannerPamela S Love, PA-C  irbesartan (AVAPRO) 150 MG tablet Take 1 tablet (150 mg total) by mouth daily. 02/25/16   Kristian CoveyBruce W Burchette, MD  LEVEMIR FLEXTOUCH 100 UNIT/ML Pen INJECT 40 UNITS INTO THE SKIN TWICE A DAY 05/29/16   Kristian CoveyBruce W Burchette, MD  levothyroxine (SYNTHROID, LEVOTHROID) 50 MCG tablet TAKE 1 TABLET BY MOUTH EVERY DAY BEFORE BREAKFAST 05/09/16   Kristian CoveyBruce W Burchette, MD  meclizine (ANTIVERT) 25 MG tablet Take 1 tablet (25 mg total) by mouth 3 (three) times daily as needed for dizziness. 06/28/16   Kristian CoveyBruce W Burchette, MD  metFORMIN (GLUCOPHAGE) 1000 MG tablet TAKE 1 TABLET(1000 MG) BY MOUTH TWICE DAILY WITH A MEAL 03/01/16   Shelva MajesticStephen O  Hunter, MD  ondansetron (ZOFRAN ODT) 4 MG disintegrating tablet Take 1 tablet (4 mg total) by mouth every 8 (eight) hours as needed for nausea or vomiting. 06/28/16   Kristian CoveyBruce W Burchette, MD  pantoprazole (PROTONIX) 40 MG tablet TAKE 1 TABLET BY MOUTH TWICE DAILY 01/19/16   Kristian CoveyBruce W Burchette, MD  pregabalin (LYRICA) 100 MG capsule Take 1 capsule (100 mg total) by mouth daily. 02/25/16   Kristian CoveyBruce W Burchette, MD  pregabalin (LYRICA) 150 MG capsule TAKE 1 CAPSULE BY MOUTH EVERY DAY 05/17/16   Kristian CoveyBruce W Burchette, MD  sulfamethoxazole-trimethoprim (BACTRIM DS,SEPTRA DS) 800-160 MG tablet Take 1 tablet by mouth 2 (two) times daily. 05/02/16   Kristian CoveyBruce W Burchette, MD    Family History Family History  Problem Relation Age of Onset  . Diabetes Mother   . Arthritis Mother   . Heart disease Mother   .  Heart disease Father   . Heart disease Sister   . Heart disease Brother   . Cancer Brother 60    lung cancer  . Celiac disease Sister   . Stroke Maternal Grandfather   . Cancer Paternal Grandfather     lung  . Heart disease Sister   . Asthma Sister     Social History Social History  Substance Use Topics  . Smoking status: Never Smoker  . Smokeless tobacco: Never Used  . Alcohol use No     Allergies   Doxycycline hyclate; Morphine sulfate; Iohexol; and Moxifloxacin   Review of Systems Review of Systems ROS reviewed and all are negative for acute change except as noted in the HPI.  Physical Exam Updated Vital Signs BP 184/87 (BP Location: Right Arm)   Pulse 63   Temp 98 F (36.7 C) (Oral)   Resp 17   SpO2 93%   Physical Exam  Constitutional: She is oriented to person, place, and time. Vital signs are normal. She appears well-developed and well-nourished.  HENT:  Head: Normocephalic and atraumatic.  Right Ear: Hearing normal.  Left Ear: Hearing normal.  Eyes: Conjunctivae and EOM are normal. Pupils are equal, round, and reactive to light.  Neck: Normal range of motion. Neck supple.    Cardiovascular: Normal rate, regular rhythm, normal heart sounds and intact distal pulses.   Pulmonary/Chest: Effort normal and breath sounds normal. She has no decreased breath sounds. She has no wheezes. She has no rhonchi. She has no rales.  Abdominal: Soft. Bowel sounds are normal.  Musculoskeletal: Normal range of motion.  Neurological: She is alert and oriented to person, place, and time. She has normal strength. No cranial nerve deficit or sensory deficit.  Cranial Nerves:  II: Pupils equal, round, reactive to light III,IV, VI: ptosis not present, extra-ocular motions intact bilaterally  V,VII: smile symmetric, facial light touch sensation equal VIII: hearing grossly normal bilaterally  IX,X: midline uvula rise  XI: bilateral shoulder shrug equal and strong XII: midline tongue extension  Skin: Skin is warm and dry.  Psychiatric: She has a normal mood and affect. Her speech is normal and behavior is normal. Thought content normal.  Nursing note and vitals reviewed.  ED Treatments / Results  Labs (all labs ordered are listed, but only abnormal results are displayed) Labs Reviewed  CBC - Abnormal; Notable for the following:       Result Value   WBC 11.3 (*)    All other components within normal limits  URINALYSIS, ROUTINE W REFLEX MICROSCOPIC - Abnormal; Notable for the following:    Color, Urine STRAW (*)    All other components within normal limits  BASIC METABOLIC PANEL  INFLUENZA PANEL BY PCR (TYPE A & B)   EKG  EKG Interpretation None      Radiology Dg Chest 2 View  Result Date: 07/15/2016 CLINICAL DATA:  Shortness of breath and cough today. EXAM: CHEST  2 VIEW COMPARISON:  02/09/2016 and prior radiograph FINDINGS: Mild cardiomegaly and mild chronic peribronchial thickening again noted. There is no evidence of focal airspace disease, pulmonary edema, suspicious pulmonary nodule/mass, pleural effusion, or pneumothorax. No acute bony abnormalities are identified.  IMPRESSION: No evidence of acute cardiopulmonary disease. Electronically Signed   By: Harmon Pier M.D.   On: 07/15/2016 18:37    Procedures Procedures (including critical care time)  Medications Ordered in ED Medications  carvedilol (COREG) tablet 6.25 mg (not administered)  sodium chloride 0.9 % bolus 500 mL (0  mLs Intravenous Stopped 07/15/16 1936)   Initial Impression / Assessment and Plan / ED Course  I have reviewed the triage vital signs and the nursing notes.  Pertinent labs & imaging results that were available during my care of the patient were reviewed by me and considered in my medical decision making (see chart for details).  Final Clinical Impressions(s) / ED Diagnoses  {I have reviewed and evaluated the relevant laboratory values. {I have reviewed and evaluated the relevant imaging studies.  {I have reviewed the relevant previous healthcare records.  {I obtained HPI from historian.   ED Course:  Assessment: Pt is a 82 y.o. female with hx COPD, DM2, HTN who presents with fatigue, weakness as well as cough x 2 days. Recently finished course of ABX for UTI. On exam, pt in NAD. Nontoxic/nonseptic appearing. VSS. Afebrile. Lungs CTA. Heart RRR. Abdomen nontender soft. CBC unremarkable. BMP unremarkable. UA unremarkable. CXR negative for acute infikltrate. Flu pending, but doubt this is etiology. Pt outside Tamiflu window. Given fluids in ED. Seen by supervising physician. Likely viral URI. Given home BP meds in ED due to HTN as pt did not take this evening. Plan is to DC home with follow up to PCP. At time of discharge, Patient is in no acute distress. Vital Signs are stable. Patient is able to ambulate. Patient able to tolerate PO.   Disposition/Plan:  DC Home Additional Verbal discharge instructions given and discussed with patient.  Pt Instructed to f/u with PCP in the next week for evaluation and treatment of symptoms. Return precautions given Pt acknowledges and agrees with  plan  Supervising Physician Marily Memos, MD  Final diagnoses:  Viral URI with cough    New Prescriptions New Prescriptions   No medications on file     Audry Pili, PA-C 07/15/16 2022    Marily Memos, MD 07/16/16 1257

## 2016-07-15 NOTE — ED Notes (Signed)
Bed: WA03 Expected date:  Expected time:  Means of arrival:  Comments: 82 yo UTI not resolved w/ abx

## 2016-07-15 NOTE — ED Provider Notes (Signed)
Medical screening examination/treatment/procedure(s) were conducted as a shared visit with non-physician practitioner(s) and myself.  I personally evaluated the patient during the encounter.  82 year old female with multiple past medical problems here with a few days weakness. States she's had some cough and sinus congestion recently but no fevers. She is had a urinary tract infection a past which treated recently for one however she feels she is recovered from that. On exam her vital signs are normal. She is slightly hypertensive. She also neurologically intact just overall weakness. Her abdomen is benign. No obvious murmurs rubs or gallops on her cardiac exam. Mild crackles in the right base but otherwise normal lung exam. We'll plan for screening workup for weakness to include infectious causes of metabolic causes. Plan for fluid hydration at this point and everything looks okay patient likely be discharged follow closely with her primary doctor.   EKG Interpretation  Date/Time:  Saturday July 15 2016 18:45:16 EST Ventricular Rate:  67 PR Interval:    QRS Duration: 74 QT Interval:  417 QTC Calculation: 441 R Axis:   -11 Text Interpretation:  Sinus rhythm Anteroseptal infarct, old No significant change since last tracing Confirmed by [facility] MD, Harm Jou [phone]) on 07/16/2016 12:57:42 PM         Marily MemosJason Geanine Vandekamp, MD 07/16/16 1258

## 2016-07-25 ENCOUNTER — Telehealth: Payer: Self-pay | Admitting: Family Medicine

## 2016-07-25 NOTE — Telephone Encounter (Signed)
Pt calling to see if Dr. Caryl NeverBurchette would like to see her after she had been in the hospital appointment was offered to the patient and she said she would wait to see what Dr. Caryl NeverBurchette says first.

## 2016-07-25 NOTE — Telephone Encounter (Signed)
Yes  Set up follow up

## 2016-07-25 NOTE — Telephone Encounter (Signed)
lmom for pt to call to schedule an appointment.

## 2016-07-27 NOTE — Telephone Encounter (Signed)
lmom for pt to call to schedule

## 2016-08-03 ENCOUNTER — Telehealth: Payer: Self-pay | Admitting: Family Medicine

## 2016-08-03 NOTE — Telephone Encounter (Signed)
Calling in today to report "abnormal finding".  NP did [employer] PAD (Peripheral artereal disease testing)on patient.  Right leg showed a severe result of 0.25.  Left side results were mild at 0.98.  Pt does complain on bilateral leg pain and both extremities were cold.  NP advised this can wait until Monday but wanted PCP to be aware.

## 2016-08-03 NOTE — Telephone Encounter (Signed)
Office now close for the weekend. Would advise NP if she feels pt needs urgent evaluation that she advise the urgent care. O/w advise that this be given to PCP for review on Monday. Thanks.

## 2016-08-03 NOTE — Telephone Encounter (Signed)
No answer at the NP's phone number.  Message forwarded to Dr Caryl NeverBurchette.

## 2016-08-04 ENCOUNTER — Other Ambulatory Visit: Payer: Self-pay | Admitting: Family Medicine

## 2016-08-04 DIAGNOSIS — M519 Unspecified thoracic, thoracolumbar and lumbosacral intervertebral disc disorder: Secondary | ICD-10-CM | POA: Diagnosis not present

## 2016-08-06 NOTE — Telephone Encounter (Signed)
Would get her in to see up early as possible this week for office evaluation.

## 2016-08-11 ENCOUNTER — Encounter: Payer: Self-pay | Admitting: Family Medicine

## 2016-08-11 ENCOUNTER — Ambulatory Visit (INDEPENDENT_AMBULATORY_CARE_PROVIDER_SITE_OTHER): Payer: Medicare Other | Admitting: Family Medicine

## 2016-08-11 VITALS — BP 120/80 | HR 65 | Temp 97.7°F | Wt 136.7 lb

## 2016-08-11 DIAGNOSIS — I739 Peripheral vascular disease, unspecified: Secondary | ICD-10-CM | POA: Diagnosis not present

## 2016-08-11 DIAGNOSIS — R6889 Other general symptoms and signs: Secondary | ICD-10-CM | POA: Diagnosis not present

## 2016-08-11 DIAGNOSIS — Z794 Long term (current) use of insulin: Secondary | ICD-10-CM

## 2016-08-11 DIAGNOSIS — E1159 Type 2 diabetes mellitus with other circulatory complications: Secondary | ICD-10-CM | POA: Diagnosis not present

## 2016-08-11 MED ORDER — GLUCOSE BLOOD VI STRP: ORAL_STRIP | 12 refills | Status: DC

## 2016-08-11 NOTE — Progress Notes (Signed)
Subjective:     Patient ID: Casey Harrell, female   DOB: [birth year], 82 y.o.   MRN: [id number]  HPI Patient here to evaluate for possible peripheral vascular disease. She apparently had nurse practitioner go out from her insurance company and they did [employer] peripheral artery disease screening test on patient and we were given results of 0.25 right and 0.98 left. Patient has long-standing history of type 2 diabetes which has been poorly controlled.  She has hx of RCE 2/11 but no hx of lower extremity PVD.  Patient has never smoked. She has no claudication symptoms. She does have long-standing history of peripheral neuropathy.  She does not describe any limitations of ambulation  Past Medical History:  Diagnosis Date  . ASTHMA 07/15/2008  . COPD (chronic obstructive pulmonary disease) (HCC)   . DIABETES-TYPE 2 07/15/2008  . DIABETIC PERIPHERAL NEUROPATHY 09/02/2009  . GERD 07/15/2008  . HYPERTENSION 07/15/2008  . Normal cardiac stress test    low risk Nuc 2009, Feb 2011  . Paroxysmal atrial fibrillation (HCC)   . PVD (peripheral vascular disease) (HCC) 06/18/09   RCE  . Stroke (HCC)   . Vertigo    Past Surgical History:  Procedure Laterality Date  . ABDOMINAL HYSTERECTOMY  1971  . APPENDECTOMY  1971  . CAROTID ENDARTERECTOMY  06/18/09   RCE  . CESAREAN SECTION    . colectomy and colostomy    . FLEXIBLE SIGMOIDOSCOPY     diverticulitis  . FOOT SURGERY     right foot X 2  . LAPAROTOMY     X 3 with adhesions lysis  . TONSILLECTOMY    . TUBAL LIGATION      reports that she has never smoked. She has never used smokeless tobacco. She reports that she does not drink alcohol or use drugs. family history includes Arthritis in her mother; Asthma in her sister; Cancer in her paternal grandfather; Cancer (age of onset: 61) in her brother; Celiac disease in her sister; Diabetes in her mother; Heart disease in her brother, father, mother, sister, and sister; Stroke in her maternal  grandfather. Allergies  Allergen Reactions  . Doxycycline Hyclate Nausea And Vomiting  . Morphine Sulfate Other (See Comments)    REACTION: hallucinations  . Iohexol Hives     Code: HIVES, Desc: pt. states she breaks out in hives 06/15/08   . Moxifloxacin Other (See Comments)    REACTION: unknown       Review of Systems  Constitutional: Negative for fatigue.  Eyes: Negative for visual disturbance.  Respiratory: Negative for cough, chest tightness, shortness of breath and wheezing.   Cardiovascular: Negative for chest pain, palpitations and leg swelling.  Endocrine: Negative for polydipsia and polyuria.  Neurological: Negative for dizziness, seizures, syncope, weakness, light-headedness and headaches.       Objective:   Physical Exam  Constitutional: She appears well-developed and well-nourished.  Eyes: Pupils are equal, round, and reactive to light.  Neck: Neck supple. No JVD present. No thyromegaly present.  Cardiovascular: Normal rate and regular rhythm.  Exam reveals no gallop.   Both feet are fairly cool to touch. She has 1+ dorsalis pedis pulse bilaterally and trace posterior tibial bilaterally. 2+ femoral pulses bilaterally  Pulmonary/Chest: Effort normal and breath sounds normal. No respiratory distress. She has no wheezes. She has no rales.  Musculoskeletal: She exhibits no edema.  Neurological: She is alert.       Assessment:     Patient is type II diabetic who had recent  abnormal peripheral artery disease screening through insurance company with concerns for decreased flow right lower extremity. Clinically, she does not describe any claudication symptoms and does not have evidence for any acute/severe ischemic changes on exam    Plan:     -Obtain arterial Dopplers to further assess -follow up immediately for any severe foot/leg pain, color changes, or major temp changes of the foot or leg.  [name] MD [facility] at [facility]

## 2016-08-11 NOTE — Progress Notes (Signed)
Pre visit review using our clinic review tool, if applicable. No additional management support is needed unless otherwise documented below in the visit note. 

## 2016-08-19 ENCOUNTER — Other Ambulatory Visit: Payer: Self-pay | Admitting: Family Medicine

## 2016-08-21 NOTE — Telephone Encounter (Signed)
pregabalin (LYRICA) 150 MG capsule last refill 05/17/16.  Last office visit 08/14/16.  Okay to fill?

## 2016-08-21 NOTE — Telephone Encounter (Signed)
Refill for 6 months. 

## 2016-09-03 ENCOUNTER — Other Ambulatory Visit: Payer: Self-pay | Admitting: Family Medicine

## 2016-09-08 ENCOUNTER — Inpatient Hospital Stay (HOSPITAL_COMMUNITY): Admission: RE | Admit: 2016-09-08 | Payer: Medicare Other | Source: Ambulatory Visit

## 2016-09-13 ENCOUNTER — Other Ambulatory Visit: Payer: Self-pay | Admitting: *Deleted

## 2016-09-13 MED ORDER — LEVOTHYROXINE SODIUM 50 MCG PO TABS: ORAL_TABLET | ORAL | 1 refills | Status: DC

## 2016-09-21 ENCOUNTER — Encounter (HOSPITAL_COMMUNITY): Payer: Self-pay | Admitting: Family Medicine

## 2016-10-02 DIAGNOSIS — L401 Generalized pustular psoriasis: Secondary | ICD-10-CM | POA: Diagnosis not present

## 2016-10-05 ENCOUNTER — Telehealth: Payer: Self-pay | Admitting: Family Medicine

## 2016-10-05 MED ORDER — GLUCOSE BLOOD VI STRP: ORAL_STRIP | 12 refills | Status: DC

## 2016-10-05 NOTE — Telephone Encounter (Signed)
Patient needs a prescription for ReliOn Confirm Plus Glucose test strips (100 count) called in to her pharmacy.   Pharmacy: [facility]

## 2016-10-05 NOTE — Telephone Encounter (Signed)
rx sent

## 2016-10-09 ENCOUNTER — Telehealth: Payer: Self-pay | Admitting: *Deleted

## 2016-10-09 MED ORDER — GLUCOSE BLOOD VI STRP: ORAL_STRIP | 12 refills | Status: DC

## 2016-10-09 NOTE — Telephone Encounter (Signed)
Insurance will not pay for Relion test strips

## 2016-10-17 NOTE — Telephone Encounter (Signed)
Pt aware.

## 2016-10-31 ENCOUNTER — Other Ambulatory Visit: Payer: Medicare Other

## 2016-10-31 ENCOUNTER — Encounter: Payer: Self-pay | Admitting: Internal Medicine

## 2016-10-31 ENCOUNTER — Ambulatory Visit (INDEPENDENT_AMBULATORY_CARE_PROVIDER_SITE_OTHER): Payer: Medicare Other | Admitting: Internal Medicine

## 2016-10-31 ENCOUNTER — Ambulatory Visit (INDEPENDENT_AMBULATORY_CARE_PROVIDER_SITE_OTHER)
Admission: RE | Admit: 2016-10-31 | Discharge: 2016-10-31 | Disposition: A | Discharge status: Home / Self Care | Payer: Medicare Other | Source: Ambulatory Visit | Attending: Internal Medicine | Admitting: Internal Medicine

## 2016-10-31 VITALS — BP 152/72 | HR 96 | Temp 98.2°F | Ht 61.0 in | Wt 130.8 lb

## 2016-10-31 DIAGNOSIS — R05 Cough: Secondary | ICD-10-CM

## 2016-10-31 DIAGNOSIS — R059 Cough, unspecified: Secondary | ICD-10-CM

## 2016-10-31 DIAGNOSIS — I63439 Cerebral infarction due to embolism of unspecified posterior cerebral artery: Secondary | ICD-10-CM | POA: Diagnosis not present

## 2016-10-31 DIAGNOSIS — E1159 Type 2 diabetes mellitus with other circulatory complications: Secondary | ICD-10-CM

## 2016-10-31 DIAGNOSIS — I480 Paroxysmal atrial fibrillation: Secondary | ICD-10-CM | POA: Diagnosis not present

## 2016-10-31 DIAGNOSIS — R6883 Chills (without fever): Secondary | ICD-10-CM

## 2016-10-31 DIAGNOSIS — I10 Essential (primary) hypertension: Secondary | ICD-10-CM

## 2016-10-31 LAB — CBC WITH DIFFERENTIAL/PLATELET
Basophils Absolute: 0.1 10*3/uL (ref 0.0–0.1)
Basophils Relative: 0.5 % (ref 0.0–3.0)
Eosinophils Absolute: 0.2 10*3/uL (ref 0.0–0.7)
Eosinophils Relative: 1.6 % (ref 0.0–5.0)
HCT: 44.8 % (ref 36.0–46.0)
Hemoglobin: 14.7 g/dL (ref 12.0–15.0)
Lymphocytes Relative: 27.3 % (ref 12.0–46.0)
Lymphs Abs: 3.1 10*3/uL (ref 0.7–4.0)
MCHC: 32.9 g/dL (ref 30.0–36.0)
MCV: 85.3 fl (ref 78.0–100.0)
Monocytes Absolute: 1.1 10*3/uL — ABNORMAL HIGH (ref 0.1–1.0)
Monocytes Relative: 9.7 % (ref 3.0–12.0)
Neutro Abs: 6.9 10*3/uL (ref 1.4–7.7)
Neutrophils Relative %: 60.9 % (ref 43.0–77.0)
Platelets: 307 10*3/uL (ref 150.0–400.0)
RBC: 5.26 Mil/uL — ABNORMAL HIGH (ref 3.87–5.11)
RDW: 16.5 % — ABNORMAL HIGH (ref 11.5–15.5)
WBC: 11.4 10*3/uL — ABNORMAL HIGH (ref 4.0–10.5)

## 2016-10-31 LAB — BASIC METABOLIC PANEL
BUN: 19 mg/dL (ref 6–23)
CO2: 28 mEq/L (ref 19–32)
Calcium: 10.4 mg/dL (ref 8.4–10.5)
Chloride: 99 mEq/L (ref 96–112)
Creatinine, Ser: 0.91 mg/dL (ref 0.40–1.20)
GFR: 63.3 mL/min (ref >60.00)
Glucose, Bld: 202 mg/dL — ABNORMAL HIGH (ref 70–99)
Potassium: 3.9 mEq/L (ref 3.5–5.1)
Sodium: 140 mEq/L (ref 135–145)

## 2016-10-31 LAB — POC URINALSYSI DIPSTICK (AUTOMATED)
Bilirubin, UA: NEGATIVE
Blood, UA: NEGATIVE
Glucose, UA: NEGATIVE
Ketones, UA: 5
Leukocytes, UA: NEGATIVE
Nitrite, UA: NEGATIVE
Protein, UA: 2
Spec Grav, UA: >=1.03 — AB (ref 1.010–1.025)
Urobilinogen, UA: 0.2 E.U./dL
pH, UA: 6 (ref 5.0–8.0)

## 2016-10-31 LAB — GLUCOSE, POCT (MANUAL RESULT ENTRY): POC Glucose: 213 mg/dl — AB (ref 70–99)

## 2016-10-31 MED ORDER — ONDANSETRON HCL 4 MG PO TABS: 4.0000 mg | ORAL_TABLET | Freq: Three times a day (TID) | ORAL | 0 refills | Status: DC | PRN

## 2016-10-31 NOTE — Patient Instructions (Addendum)
WE NOW OFFER   [facility] FAST TRACK!!!  SAME DAY Appointments for ACUTE CARE  Such as: Sprains, Injuries, cuts, abrasions, rashes, muscle pain, joint pain, back pain Colds, flu, sore throats, headache, allergies, cough, fever  Ear pain, sinus and eye infections Abdominal pain, nausea, vomiting, diarrhea, upset stomach Animal/insect bites  3 Easy Ways to Schedule: Walk-In Scheduling Call in scheduling Mychart Sign-up: [email or web address]    Take over-the-counter expectorants and cough medications such as  Mucinex DM.  Call if there is no improvement in 5 to 7 days or if  you develop worsening cough, fever, or new symptoms, such as shortness of breath or chest pain.  Drink clear liquids only for the next 24 hours,  then slowly add other liquids and food as you  tolerate them  Chest x-ray as discussed  Return in 3 days for follow-up.  Report to the emergency department for evaluation if there is any clinical worsening   Take [phone] mg of Tylenol every 6 hours as needed for pain relief or fever.  Avoid taking more than 3000 mg in a 24-hour period (  This may cause liver damage).

## 2016-10-31 NOTE — Progress Notes (Signed)
Subjective:    Patient ID: Casey Harrell, female    DOB: October 10, 1934, 82 y.o.   MRN: [id number]  HPI 82 year old patient with multiple medical issues including cerebrovascular disease, hypertension, prior stroke, type 2 diabetes with chronic kidney disease.  Medical regimen includes home nebulizer treatments with DuoNeb.  She was stable until yesterday when she had the abrupt onset of nausea, chills and myalgias. She has developed a nonproductive cough.  The patient denies any shortness of breath, but did complain of this to the nurse. She denies any wheezing She is on chronic anticoagulation due to embolic stroke and paroxysmal atrial fibrillation.  She has been compliant with this medication She states that she has had UTIs in the past and gives a two-week history of some dysuria. A chest x-ray was negative  3 months ago.  This revealed mild cardiomegaly and mild chronic peribronchial thickening  Past medical history is pertinent for a history of small bowel obstruction 20 years ago.  She had a normal bowel movement yesterday  Due to nausea, she has not eaten today.  Denies any fever   Review of Systems  Constitutional: Positive for activity change, appetite change, chills and fatigue. Negative for diaphoresis and fever.  HENT: Negative for congestion, dental problem, hearing loss, rhinorrhea, sinus pressure, sore throat and tinnitus.   Eyes: Negative for pain, discharge and visual disturbance.  Respiratory: Positive for cough and shortness of breath. Negative for wheezing.   Cardiovascular: Negative for chest pain, palpitations and leg swelling.  Gastrointestinal: Positive for nausea and vomiting. Negative for abdominal distention, abdominal pain, blood in stool, constipation and diarrhea.  Genitourinary: Positive for dysuria. Negative for difficulty urinating, flank pain, frequency, hematuria, pelvic pain, urgency, vaginal bleeding, vaginal discharge and vaginal pain.  Musculoskeletal:  Positive for myalgias. Negative for arthralgias, gait problem and joint swelling.  Skin: Negative for rash.  Neurological: Negative for dizziness, syncope, speech difficulty, weakness, numbness and headaches.  Hematological: Negative for adenopathy.  Psychiatric/Behavioral: Negative for agitation, behavioral problems and dysphoric mood. The patient is not nervous/anxious.        Objective:   Physical Exam  Constitutional: She is oriented to person, place, and time. She appears well-developed and well-nourished.  Appears weak but in no acute distress.  Afebrile.  Blood pressure 140/70, pulse 90 O2 saturation 98%  Random blood sugar 2 1 3   HENT:  Head: Normocephalic.  Right Ear: External ear normal.  Left Ear: External ear normal.  Mouth/Throat: Oropharynx is clear and moist.  Mucosa membranes appeared well hydrated  Eyes: Conjunctivae and EOM are normal. Pupils are equal, round, and reactive to light.  Neck: Normal range of motion. Neck supple. No thyromegaly present.  Cardiovascular: Normal rate, regular rhythm, normal heart sounds and intact distal pulses.   Pulmonary/Chest: Effort normal and breath sounds normal. No respiratory distress. She has no wheezes. She has no rales.  Quiet, unlabored respirations O2 saturation 98%  Abdominal: Soft. Bowel sounds are normal. She exhibits no mass. There is tenderness.  Very mild diffuse tenderness.  Seems maximal in the epigastric area No suprapubic tenderness or flank tenderness  Musculoskeletal: Normal range of motion.  Lymphadenopathy:    She has no cervical adenopathy.  Neurological: She is alert and oriented to person, place, and time.  Skin: Skin is warm and dry. No rash noted.  Psychiatric: She has a normal mood and affect. Her behavior is normal.          Assessment & Plan:   Acute  illness with chills, myalgias and nausea History of dysuria.  Will check a UA to rule out a urinary tract infection; if this is negative, will  consider a chest x-ray to exclude pneumonia in view of history of cough. Chronic anticoagulation Chronic kidney disease Cerebrovascular disease  Urinalysis negative.  Will check a chest x-ray to exclude pneumonia in view of her cough. Patient has multiple comorbidities and high risk for hospital admission.  Patient and husband made aware to report to the ED for evaluation if any clinical worsening or unable to tolerate oral intake.  She'll be treated with an anti-emetic and placed on a clear liquid diet to be advanced as tolerated. We'll check CBC and electrolytes  Follow-up 3 days with PCP prior to weekend  [facility]

## 2016-11-03 LAB — URINE CULTURE

## 2016-11-03 MED ORDER — CIPROFLOXACIN HCL 500 MG PO TABS: 500.0000 mg | ORAL_TABLET | Freq: Two times a day (BID) | ORAL | 0 refills | Status: DC

## 2016-11-03 NOTE — Addendum Note (Signed)
Addended by: Neville RouteJAIMES, PATRICIA G on: 11/03/2016 01:16 PM   Modules accepted: Orders

## 2016-11-09 ENCOUNTER — Emergency Department (HOSPITAL_COMMUNITY): Payer: Medicare Other

## 2016-11-09 ENCOUNTER — Emergency Department (HOSPITAL_COMMUNITY)
Admission: EM | Admit: 2016-11-09 | Discharge: 2016-11-10 | Disposition: A | Discharge status: Home / Self Care | Payer: Medicare Other | Attending: Emergency Medicine | Admitting: Emergency Medicine

## 2016-11-09 ENCOUNTER — Encounter (HOSPITAL_COMMUNITY): Payer: Self-pay | Admitting: Emergency Medicine

## 2016-11-09 DIAGNOSIS — R197 Diarrhea, unspecified: Secondary | ICD-10-CM | POA: Diagnosis not present

## 2016-11-09 DIAGNOSIS — R42 Dizziness and giddiness: Secondary | ICD-10-CM | POA: Diagnosis not present

## 2016-11-09 DIAGNOSIS — E039 Hypothyroidism, unspecified: Secondary | ICD-10-CM | POA: Diagnosis not present

## 2016-11-09 DIAGNOSIS — J449 Chronic obstructive pulmonary disease, unspecified: Secondary | ICD-10-CM | POA: Insufficient documentation

## 2016-11-09 DIAGNOSIS — I129 Hypertensive chronic kidney disease with stage 1 through stage 4 chronic kidney disease, or unspecified chronic kidney disease: Secondary | ICD-10-CM | POA: Insufficient documentation

## 2016-11-09 DIAGNOSIS — Z7984 Long term (current) use of oral hypoglycemic drugs: Secondary | ICD-10-CM | POA: Diagnosis not present

## 2016-11-09 DIAGNOSIS — R112 Nausea with vomiting, unspecified: Secondary | ICD-10-CM | POA: Diagnosis not present

## 2016-11-09 DIAGNOSIS — Z79899 Other long term (current) drug therapy: Secondary | ICD-10-CM | POA: Diagnosis not present

## 2016-11-09 DIAGNOSIS — E860 Dehydration: Secondary | ICD-10-CM

## 2016-11-09 DIAGNOSIS — N183 Chronic kidney disease, stage 3 (moderate): Secondary | ICD-10-CM | POA: Insufficient documentation

## 2016-11-09 DIAGNOSIS — R0902 Hypoxemia: Secondary | ICD-10-CM | POA: Insufficient documentation

## 2016-11-09 DIAGNOSIS — R1032 Left lower quadrant pain: Secondary | ICD-10-CM | POA: Diagnosis not present

## 2016-11-09 DIAGNOSIS — R1111 Vomiting without nausea: Secondary | ICD-10-CM | POA: Diagnosis not present

## 2016-11-09 DIAGNOSIS — Z8673 Personal history of transient ischemic attack (TIA), and cerebral infarction without residual deficits: Secondary | ICD-10-CM | POA: Diagnosis not present

## 2016-11-09 DIAGNOSIS — Z7901 Long term (current) use of anticoagulants: Secondary | ICD-10-CM | POA: Diagnosis not present

## 2016-11-09 DIAGNOSIS — R918 Other nonspecific abnormal finding of lung field: Secondary | ICD-10-CM | POA: Diagnosis not present

## 2016-11-09 DIAGNOSIS — E1122 Type 2 diabetes mellitus with diabetic chronic kidney disease: Secondary | ICD-10-CM | POA: Diagnosis not present

## 2016-11-09 DIAGNOSIS — R0602 Shortness of breath: Secondary | ICD-10-CM | POA: Diagnosis not present

## 2016-11-09 DIAGNOSIS — K2970 Gastritis, unspecified, without bleeding: Secondary | ICD-10-CM | POA: Diagnosis not present

## 2016-11-09 LAB — URINALYSIS, ROUTINE W REFLEX MICROSCOPIC
Bilirubin Urine: NEGATIVE
Glucose, UA: 50 mg/dL — AB
Hgb urine dipstick: NEGATIVE
Ketones, ur: NEGATIVE mg/dL
Nitrite: NEGATIVE
Protein, ur: 30 mg/dL — AB
RBC / HPF: NONE SEEN RBC/hpf (ref 0–5)
Specific Gravity, Urine: 1.008 (ref 1.005–1.030)
Squamous Epithelial / LPF: NONE SEEN
pH: 6 (ref 5.0–8.0)

## 2016-11-09 LAB — COMPREHENSIVE METABOLIC PANEL
ALT: 21 U/L (ref 14–54)
AST: 32 U/L (ref 15–41)
Albumin: 4.6 g/dL (ref 3.5–5.0)
Alkaline Phosphatase: 73 U/L (ref 38–126)
Anion gap: 16 — ABNORMAL HIGH (ref 5–15)
BUN: 21 mg/dL — ABNORMAL HIGH (ref 6–20)
CO2: 23 mmol/L (ref 22–32)
Calcium: 10.1 mg/dL (ref 8.9–10.3)
Chloride: 100 mmol/L — ABNORMAL LOW (ref 101–111)
Creatinine, Ser: 0.96 mg/dL (ref 0.44–1.00)
GFR calc Af Amer: >60 mL/min (ref >60)
GFR calc non Af Amer: 55 mL/min — ABNORMAL LOW (ref >60)
Glucose, Bld: 195 mg/dL — ABNORMAL HIGH (ref 65–99)
Potassium: 4.1 mmol/L (ref 3.5–5.1)
Sodium: 139 mmol/L (ref 135–145)
Total Bilirubin: 0.6 mg/dL (ref 0.3–1.2)
Total Protein: 8.6 g/dL — ABNORMAL HIGH (ref 6.5–8.1)

## 2016-11-09 LAB — I-STAT CG4 LACTIC ACID, ED
Lactic Acid, Venous: 3.32 mmol/L (ref 0.5–1.9)
Lactic Acid, Venous: 1.01 mmol/L (ref 0.5–1.9)

## 2016-11-09 LAB — LIPASE, BLOOD: Lipase: 11 U/L (ref 11–51)

## 2016-11-09 LAB — CBG MONITORING, ED: Glucose-Capillary: 168 mg/dL — ABNORMAL HIGH (ref 65–99)

## 2016-11-09 LAB — CBC
HCT: 44.3 % (ref 36.0–46.0)
Hemoglobin: 15.1 g/dL — ABNORMAL HIGH (ref 12.0–15.0)
MCH: 28.6 pg (ref 26.0–34.0)
MCHC: 34.1 g/dL (ref 30.0–36.0)
MCV: 83.9 fL (ref 78.0–100.0)
Platelets: 268 10*3/uL (ref 150–400)
RBC: 5.28 MIL/uL — ABNORMAL HIGH (ref 3.87–5.11)
RDW: 15.1 % (ref 11.5–15.5)
WBC: 12.9 10*3/uL — ABNORMAL HIGH (ref 4.0–10.5)

## 2016-11-09 MED ORDER — CARVEDILOL 6.25 MG PO TABS
6.2500 mg | ORAL_TABLET | Freq: Two times a day (BID) | ORAL | Status: DC
  Administered 2016-11-10: 6.25 mg via ORAL
  Filled 2016-11-09 (×2): qty 1

## 2016-11-09 MED ORDER — SODIUM CHLORIDE 0.9 % IV BOLUS (SEPSIS): 1000.0000 mL | Freq: Once | INTRAVENOUS | Status: DC

## 2016-11-09 MED ORDER — SODIUM CHLORIDE 0.9 % IV BOLUS (SEPSIS)
1000.0000 mL | Freq: Once | INTRAVENOUS | Status: AC
  Administered 2016-11-09: 1000 mL via INTRAVENOUS

## 2016-11-09 MED ORDER — APIXABAN 5 MG PO TABS
5.0000 mg | ORAL_TABLET | Freq: Once | ORAL | Status: AC
  Administered 2016-11-10: 5 mg via ORAL
  Filled 2016-11-09: qty 1

## 2016-11-09 MED ORDER — INSULIN DETEMIR 100 UNIT/ML ~~LOC~~ SOLN
20.0000 [IU] | Freq: Once | SUBCUTANEOUS | Status: AC
  Administered 2016-11-10: 20 [IU] via SUBCUTANEOUS
  Filled 2016-11-09: qty 0.2

## 2016-11-09 MED ORDER — METFORMIN HCL 500 MG PO TABS
1000.0000 mg | ORAL_TABLET | Freq: Once | ORAL | Status: AC
  Administered 2016-11-10: 1000 mg via ORAL
  Filled 2016-11-09: qty 2

## 2016-11-09 NOTE — ED Provider Notes (Signed)
[facility]-EMERGENCY DEPT Provider Note   CSN: [id number] Arrival date & time: 11/09/16  1423     History   Chief Complaint Chief Complaint  Patient presents with  . Emesis  . Diarrhea    HPI Casey Harrell is a 82 y.o. female.  82yo F w/ PMH including A fib, T2DM, PVD, CVA, COPD, bowel resection 2/2 diverticulitis who p/w nausea, vomiting, diarrhea and abdominal pain. Patient states that she began having nausea and vomiting associated with nonbloody diarrhea this morning. She has also had abdominal pain that she describes as constant, currently mild, and generalized. She reports 1 week of dysuria without associated back pain. No fevers, chest pain, cough/cold symptoms, or shortness of breath. No sick contacts or recent antibiotic use. She has had diverticulitis in the distant past but states this does not feel the same.   The history is provided by the patient.  Emesis   Associated symptoms include diarrhea.  Diarrhea   Associated symptoms include vomiting.    Past Medical History:  Diagnosis Date  . ASTHMA 07/15/2008  . COPD (chronic obstructive pulmonary disease) (HCC)   . DIABETES-TYPE 2 07/15/2008  . DIABETIC PERIPHERAL NEUROPATHY 09/02/2009  . GERD 07/15/2008  . HYPERTENSION 07/15/2008  . Normal cardiac stress test    low risk Nuc 2009, Feb 2011  . Paroxysmal atrial fibrillation (HCC)   . PVD (peripheral vascular disease) (HCC) 06/18/09   RCE  . Stroke (HCC)   . Vertigo     Patient Active Problem List   Diagnosis Date Noted  . Chest pain 02/09/2016  . BPPV (benign paroxysmal positional vertigo) 08/25/2015  . HLD (hyperlipidemia) 08/25/2015  . At high risk for falls 07/01/2015  . Vertigo 06/21/2015  . Paroxysmal atrial fibrillation (HCC) 06/11/2015  . Cerebral infarction due to stenosis of left middle cerebral artery (HCC) 05/20/2015  . Intracranial vascular stenosis 05/20/2015  . Type 2 diabetes mellitus with circulatory disorder (HCC) 05/20/2015  . Type 2 diabetes,  uncontrolled, with neuropathy (HCC)   . Abdominal pain   . Partial small bowel obstruction (HCC)   . Benign essential HTN   . Small bowel obstruction (HCC) 05/07/2015  . Hemiplegia, unspecified affecting right dominant side (HCC) 04/08/2015  . Embolic stroke involving posterior cerebral artery (HCC) 04/05/2015  . Orthostatic hypotension   . Hypertensive urgency 04/01/2015  . Fever in adult 04/01/2015  . Acute encephalopathy 04/01/2015  . Confusion   . CVA (cerebral infarction)   . Posterior circulation stroke (HCC)   . Acute ischemic stroke (HCC) 03/31/2015  . Stroke (HCC) 03/31/2015  . CKD (chronic kidney disease) stage 3, GFR 30-59 ml/min 04/17/2014  . Gastroenteritis, non-infectious 04/11/2014  . Nausea vomiting and diarrhea 04/10/2014  . Hypokalemia 04/10/2014  . Dehydration 04/09/2014  . Bronchitis with airway obstruction (HCC) 02/03/2013  . Leukocytosis 02/03/2013  . Fever 02/03/2013  . Acute respiratory failure with hypoxia (HCC) 02/03/2013  . Depression 02/03/2013  . Hyponatremia 02/03/2013  . Obesity (BMI 30-39.9) 01/10/2013  . PVD (peripheral vascular disease)- S/P RCE 06/18/09 12/13/2012  . Sleep apnea- C-pap intol 12/13/2012  . Low risk cardiac nuclear stress test- 2009 and Feb 2011 12/13/2012  . Mild persistent asthma 11/29/2011  . Chronic cough 06/05/2011  . Diabetic neuropathy (HCC) 09/02/2009  . Dyslipidemia 07/13/2009  . Hypothyroidism 11/12/2008  . Type 2 diabetes mellitus, uncontrolled (HCC) 07/15/2008  . Essential hypertension 07/15/2008  . GERD 07/15/2008  . DIVERTICULOSIS, COLON 07/15/2008    Past Surgical History:  Procedure Laterality Date  .  ABDOMINAL HYSTERECTOMY  1971  . APPENDECTOMY  1971  . CAROTID ENDARTERECTOMY  06/18/09   RCE  . CESAREAN SECTION    . colectomy and colostomy    . FLEXIBLE SIGMOIDOSCOPY     diverticulitis  . FOOT SURGERY     right foot X 2  . LAPAROTOMY     X 3 with adhesions lysis  . TONSILLECTOMY    . TUBAL  LIGATION      OB History    No data available       Home Medications    Prior to Admission medications   Medication Sig Start Date End Date Taking? Authorizing Provider  acetaminophen (TYLENOL) 325 MG tablet Take 650 mg by mouth every 6 (six) hours as needed (pain).   Yes [provider]  albuterol (ACCUNEB) 1.25 MG/3ML nebulizer solution Take 3 mLs (1.25 mg total) by nebulization every 4 (four) hours as needed for wheezing. Reported on 04/26/2015 05/13/15  Yes Jeralyn Bennett, MD  apixaban (ELIQUIS) 5 MG TABS tablet TAKE 1 TABLET(5 MG) BY MOUTH TWICE DAILY 05/05/16  Yes Runell Gess, MD  atorvastatin (LIPITOR) 40 MG tablet TAKE 1 TABLET BY MOUTH EVERY DAY 03/01/16  Yes Shelva Majestic, MD  CALCIUM PO Take 1 tablet by mouth daily.   Yes [provider]  carvedilol (COREG) 6.25 MG tablet TAKE 1 TABLET(6.25 MG) BY MOUTH TWICE DAILY WITH A MEAL 03/09/16  Yes Burchette, Elberta Fortis, MD  Cholecalciferol (VITAMIN D3) 2000 UNITS TABS Take 2,000 Units by mouth daily. Reported on 04/26/2015   Yes [provider]  ciprofloxacin (CIPRO) 500 MG tablet Take 1 tablet (500 mg total) by mouth 2 (two) times daily. 11/03/16  Yes Gordy Savers, MD  DULoxetine (CYMBALTA) 30 MG capsule TAKE 1 CAPSULE BY MOUTH EVERY DAY FOR 2 WEEKS, THEN INCREASE TO 1 CAPSULE TWICE A DAY 09/04/16  Yes Burchette, Elberta Fortis, MD  glucose blood test strip Use once daily E11.9 10/09/16  Yes Burchette, Elberta Fortis, MD  HUMALOG KWIKPEN 100 UNIT/ML KiwkPen INJECT 9-14 UNITS SUBCUTANEOUSLY TWICE DAILY WITH A MEAL.(9UNITS WITH BREAKFAST, 14 UNITS AFTER SUPPER) Patient taking differently: INJECT 4-10 UNITS SUBCUTANEOUSLY TWICE DAILY WITH A MEAL. Sliding scale 02/11/16  Yes Burchette, Elberta Fortis, MD  ipratropium-albuterol (DUONEB) 0.5-2.5 (3) MG/3ML SOLN Take 3 mLs by nebulization every 6 (six) hours as needed (shortness of breath or wheezing). 04/20/15  Yes Love, Evlyn Kanner, PA-C  irbesartan (AVAPRO) 150 MG tablet Take 1  tablet (150 mg total) by mouth daily. 02/25/16  Yes Burchette, Elberta Fortis, MD  LEVEMIR FLEXTOUCH 100 UNIT/ML Pen INJECT 40 UNITS INTO THE SKIN TWICE A DAY Patient taking differently: INJECT 30 UNITS INTO THE SKIN TWICE A DAY 05/29/16  Yes Burchette, Elberta Fortis, MD  levothyroxine (SYNTHROID, LEVOTHROID) 50 MCG tablet TAKE 1 TABLET BY MOUTH EVERY DAY BEFORE BREAKFAST 09/13/16  Yes Burchette, Elberta Fortis, MD  meclizine (ANTIVERT) 25 MG tablet Take 1 tablet (25 mg total) by mouth 3 (three) times daily as needed for dizziness. 06/28/16  Yes Burchette, Elberta Fortis, MD  metFORMIN (GLUCOPHAGE) 1000 MG tablet TAKE 1 TABLET(1000 MG) BY MOUTH TWICE DAILY WITH A MEAL 03/01/16  Yes Shelva Majestic, MD  ondansetron (ZOFRAN) 4 MG tablet Take 1 tablet (4 mg total) by mouth every 8 (eight) hours as needed for nausea or vomiting. 10/31/16  Yes Gordy Savers, MD  pantoprazole (PROTONIX) 40 MG tablet TAKE 1 TABLET BY MOUTH TWICE DAILY 01/19/16  Yes Burchette, Elberta Fortis, MD  pregabalin (LYRICA) 100 MG capsule Take 1 capsule (100 mg total) by mouth daily. 02/25/16  Yes Burchette, Elberta Fortis, MD  LYRICA 150 MG capsule TAKE 1 CAPSULE EVERY DAY Patient not taking: Reported on 11/09/2016 08/22/16   [name], MD  ondansetron (ZOFRAN ODT) 4 MG disintegrating tablet Take 1 tablet (4 mg total) by mouth every 8 (eight) hours as needed for nausea or vomiting. 11/10/16   Little, Ambrose Finland, MD    Family History Family History  Problem Relation Age of Onset  . Diabetes Mother   . Arthritis Mother   . Heart disease Mother   . Heart disease Father   . Heart disease Sister   . Heart disease Brother   . Cancer Brother 60       lung cancer  . Celiac disease Sister   . Stroke Maternal Grandfather   . Cancer Paternal Grandfather        lung  . Heart disease Sister   . Asthma Sister     Social History Social History  Substance Use Topics  . Smoking status: Never Smoker  . Smokeless tobacco: Never Used  . Alcohol use No      Allergies   Doxycycline hyclate; Morphine sulfate; Iohexol; and Moxifloxacin   Review of Systems Review of Systems  Gastrointestinal: Positive for diarrhea and vomiting.     Physical Exam Updated Vital Signs BP (!) 159/83   Pulse 72   Temp 98.5 F (36.9 C)   Resp 18   Ht 5\' 1"  (1.549 m)   Wt 59 kg (130 lb)   SpO2 94%   BMI 24.56 kg/m   Physical Exam  Constitutional: She is oriented to person, place, and time. She appears well-developed and well-nourished. No distress.  HENT:  Head: Normocephalic and atraumatic.  Dry mucous membranes  Eyes: Conjunctivae are normal. Pupils are equal, round, and reactive to light.  Neck: Neck supple.  Cardiovascular: Normal rate, regular rhythm and normal heart sounds.   No murmur heard. Pulmonary/Chest: Effort normal and breath sounds normal.  Abdominal: Soft. She exhibits no distension and no mass. Bowel sounds are decreased. There is tenderness (generalized). There is no rebound.  Musculoskeletal: She exhibits no edema.  Neurological: She is alert and oriented to person, place, and time.  Fluent speech  Skin: Skin is warm and dry.  Psychiatric: She has a normal mood and affect. Judgment normal.  Nursing note and vitals reviewed.    ED Treatments / Results  Labs (all labs ordered are listed, but only abnormal results are displayed) Labs Reviewed  COMPREHENSIVE METABOLIC PANEL - Abnormal; Notable for the following:       Result Value   Chloride 100 (*)    Glucose, Bld 195 (*)    BUN 21 (*)    Total Protein 8.6 (*)    GFR calc non Af Amer 55 (*)    Anion gap 16 (*)    All other components within normal limits  CBC - Abnormal; Notable for the following:    WBC 12.9 (*)    RBC 5.28 (*)    Hemoglobin 15.1 (*)    All other components within normal limits  URINALYSIS, ROUTINE W REFLEX MICROSCOPIC - Abnormal; Notable for the following:    Glucose, UA 50 (*)    Protein, ur 30 (*)    Leukocytes, UA TRACE (*)    Bacteria,  UA RARE (*)    All other components within normal limits  I-STAT CG4 LACTIC ACID,  ED - Abnormal; Notable for the following:    Lactic Acid, Venous 3.32 (*)    All other components within normal limits  CBG MONITORING, ED - Abnormal; Notable for the following:    Glucose-Capillary 168 (*)    All other components within normal limits  URINE CULTURE  CULTURE, BLOOD (ROUTINE X 2)  CULTURE, BLOOD (ROUTINE X 2)  LIPASE, BLOOD  I-STAT CG4 LACTIC ACID, ED    EKG  EKG Interpretation None       Radiology Ct Abdomen Pelvis Wo Contrast  Result Date: 11/09/2016 CLINICAL DATA:  Nausea vomiting and diarrhea this morning. Left lower quadrant pain. Dizziness and hypoxia. EXAM: CT CHEST, ABDOMEN AND PELVIS WITHOUT CONTRAST TECHNIQUE: Multidetector CT imaging of the chest, abdomen and pelvis was performed following the standard protocol without IV contrast. COMPARISON:  Abdomen and pelvis CT 05/07/2015.  Chest CT 02/03/2013. FINDINGS: CT CHEST FINDINGS Cardiovascular: The heart size is normal. No pericardial effusion. Coronary artery calcification is noted. Atherosclerotic calcification is noted in the wall of the thoracic aorta. Mediastinum/Nodes: No mediastinal lymphadenopathy. No evidence for gross hilar lymphadenopathy although assessment is limited by the lack of intravenous contrast on today's study. The esophagus has normal imaging features. There is no axillary lymphadenopathy. Lungs/Pleura: Biapical pleural-parenchymal scarring is noted. There is an area of irregular subpleural reticulation in the right upper lung with associated granulomatous change. This was present in 2014 but is slightly progressed in the interval. 5 mm right upper lobe pulmonary nodule is seen image 66 series 6 and not seen on prior exam. 2 mm posterior right upper lobe nodule image 60 is unchanged. 2 mm peripheral right upper lobe nodule on image 57 is also stable. Tiny nodule left lung apex not definitely seen on prior study. 5  mm left upper lobe nodule on image 31 is stable. No focal airspace consolidation. No pulmonary edema or pleural effusion. Musculoskeletal: Bone windows reveal no worrisome lytic or sclerotic osseous lesions. CT ABDOMEN PELVIS FINDINGS Hepatobiliary: No focal abnormality in the liver on this study without intravenous contrast. There is no evidence for gallstones, gallbladder wall thickening, or pericholecystic fluid. No intrahepatic or extrahepatic biliary dilation. Pancreas: No focal mass lesion. No dilatation of the main duct. No intraparenchymal cyst. No peripancreatic edema. Spleen: No splenomegaly. No focal mass lesion. Adrenals/Urinary Tract: No adrenal nodule or mass. Kidneys are unremarkable. No evidence for hydroureter. The urinary bladder appears normal for the degree of distention. Stomach/Bowel: Stomach is nondistended. No gastric wall thickening. No evidence of outlet obstruction. Duodenum is normally positioned as is the ligament of Treitz. No small bowel wall thickening. No small bowel dilatation. Small bowel anastomosis identified left abdomen. Diverticuli are seen scattered along the entire length of the colon without CT findings of diverticulitis. Apparent colonic anastomosis identified in the mid to distal sigmoid segment. Vascular/Lymphatic: There is abdominal aortic atherosclerosis without aneurysm. There is no gastrohepatic or hepatoduodenal ligament lymphadenopathy. No intraperitoneal or retroperitoneal lymphadenopathy. No pelvic sidewall lymphadenopathy. Reproductive: Uterus surgically absent.  There is no adnexal mass. Other: No intraperitoneal free fluid. Musculoskeletal: Bone windows reveal no worrisome lytic or sclerotic osseous lesions. Soft tissue attenuation in the subcutaneous fat of the anterior lower right abdominal wall is similar to prior. IMPRESSION: 1. No acute findings in the chest. There are scattered tiny bilateral pulmonary nodules many of which were present on the prior  study but a 5 mm nodule in the right upper lobe appears new. No follow-up needed if patient is low-risk (and has no  known or suspected primary neoplasm). Non-contrast chest CT can be considered in 12 months if patient is high-risk. This recommendation follows the consensus statement: Guidelines for Management of Incidental Pulmonary Nodules Detected on CT Images: From the Fleischner Society 2017; Radiology 2017; 284:228-243. 2. No acute findings in the abdomen or pelvis. Diffuse colonic diverticulosis evident without diverticulitis. 3.  Aortic Atherosclerois (ICD10-170.0) Electronically Signed   By: Kennith CenterEric  Mansell M.D.   On: 11/09/2016 18:50   Dg Chest 2 View  Result Date: 11/09/2016 CLINICAL DATA:  Shortness of breath and hypoxia. EXAM: CHEST  2 VIEW COMPARISON:  10/31/2016 and prior radiographs FINDINGS: The cardiomediastinal silhouette is unremarkable. Peribronchial thickening and mild right upper lung scarring again noted. There is no evidence of focal airspace disease, pulmonary edema, suspicious pulmonary nodule/mass, pleural effusion, or pneumothorax. No acute bony abnormalities are identified. IMPRESSION: No evidence of acute cardiopulmonary disease. Chronic peribronchial thickening. Electronically Signed   By: Harmon PierJeffrey  Hu M.D.   On: 11/09/2016 17:00   Ct Chest Wo Contrast  Result Date: 11/09/2016 CLINICAL DATA:  Nausea vomiting and diarrhea this morning. Left lower quadrant pain. Dizziness and hypoxia. EXAM: CT CHEST, ABDOMEN AND PELVIS WITHOUT CONTRAST TECHNIQUE: Multidetector CT imaging of the chest, abdomen and pelvis was performed following the standard protocol without IV contrast. COMPARISON:  Abdomen and pelvis CT 05/07/2015.  Chest CT 02/03/2013. FINDINGS: CT CHEST FINDINGS Cardiovascular: The heart size is normal. No pericardial effusion. Coronary artery calcification is noted. Atherosclerotic calcification is noted in the wall of the thoracic aorta. Mediastinum/Nodes: No mediastinal  lymphadenopathy. No evidence for gross hilar lymphadenopathy although assessment is limited by the lack of intravenous contrast on today's study. The esophagus has normal imaging features. There is no axillary lymphadenopathy. Lungs/Pleura: Biapical pleural-parenchymal scarring is noted. There is an area of irregular subpleural reticulation in the right upper lung with associated granulomatous change. This was present in 2014 but is slightly progressed in the interval. 5 mm right upper lobe pulmonary nodule is seen image 66 series 6 and not seen on prior exam. 2 mm posterior right upper lobe nodule image 60 is unchanged. 2 mm peripheral right upper lobe nodule on image 57 is also stable. Tiny nodule left lung apex not definitely seen on prior study. 5 mm left upper lobe nodule on image 31 is stable. No focal airspace consolidation. No pulmonary edema or pleural effusion. Musculoskeletal: Bone windows reveal no worrisome lytic or sclerotic osseous lesions. CT ABDOMEN PELVIS FINDINGS Hepatobiliary: No focal abnormality in the liver on this study without intravenous contrast. There is no evidence for gallstones, gallbladder wall thickening, or pericholecystic fluid. No intrahepatic or extrahepatic biliary dilation. Pancreas: No focal mass lesion. No dilatation of the main duct. No intraparenchymal cyst. No peripancreatic edema. Spleen: No splenomegaly. No focal mass lesion. Adrenals/Urinary Tract: No adrenal nodule or mass. Kidneys are unremarkable. No evidence for hydroureter. The urinary bladder appears normal for the degree of distention. Stomach/Bowel: Stomach is nondistended. No gastric wall thickening. No evidence of outlet obstruction. Duodenum is normally positioned as is the ligament of Treitz. No small bowel wall thickening. No small bowel dilatation. Small bowel anastomosis identified left abdomen. Diverticuli are seen scattered along the entire length of the colon without CT findings of diverticulitis.  Apparent colonic anastomosis identified in the mid to distal sigmoid segment. Vascular/Lymphatic: There is abdominal aortic atherosclerosis without aneurysm. There is no gastrohepatic or hepatoduodenal ligament lymphadenopathy. No intraperitoneal or retroperitoneal lymphadenopathy. No pelvic sidewall lymphadenopathy. Reproductive: Uterus surgically absent.  There is no  adnexal mass. Other: No intraperitoneal free fluid. Musculoskeletal: Bone windows reveal no worrisome lytic or sclerotic osseous lesions. Soft tissue attenuation in the subcutaneous fat of the anterior lower right abdominal wall is similar to prior. IMPRESSION: 1. No acute findings in the chest. There are scattered tiny bilateral pulmonary nodules many of which were present on the prior study but a 5 mm nodule in the right upper lobe appears new. No follow-up needed if patient is low-risk (and has no known or suspected primary neoplasm). Non-contrast chest CT can be considered in 12 months if patient is high-risk. This recommendation follows the consensus statement: Guidelines for Management of Incidental Pulmonary Nodules Detected on CT Images: From the Fleischner Society 2017; Radiology 2017; 284:228-243. 2. No acute findings in the abdomen or pelvis. Diffuse colonic diverticulosis evident without diverticulitis. 3.  Aortic Atherosclerois (ICD10-170.0) Electronically Signed   By: Kennith Center M.D.   On: 11/09/2016 18:50    Procedures Procedures (including critical care time)  Medications Ordered in ED Medications  carvedilol (COREG) tablet 6.25 mg (6.25 mg Oral Given 11/10/16 0006)  sodium chloride 0.9 % bolus 1,000 mL (0 mLs Intravenous Stopped 11/09/16 2359)  apixaban (ELIQUIS) tablet 5 mg (5 mg Oral Given 11/10/16 0006)  metFORMIN (GLUCOPHAGE) tablet 1,000 mg (1,000 mg Oral Given 11/10/16 0006)  insulin detemir (LEVEMIR) injection 20 Units (20 Units Subcutaneous Given 11/10/16 0005)     Initial Impression / Assessment and Plan / ED Course   I have reviewed the triage vital signs and the nursing notes.  Pertinent labs & imaging results that were available during my care of the patient were reviewed by me and considered in my medical decision making (see chart for details).     PT w/ Nausea, vomiting, diarrhea and generalized abdominal pain starting this morning. She was nontoxic on exam, vital signs reassuring. I did note that she occasionally had dips in her oxygen to 88% with good waveform on the monitor. She had absolutely no respiratory complaints. Her abdominal tenderness was generalized but no significant distention or peritonitis. Hypoactive bowel sounds. Obtained above lab work and gave IV fluid bolus.  Labs show a lactate of 3.32, WBC 12.9, no evidence of infection on UA, normal creatinine.  Obtained CT chest/abd/pelvis which showed no acute findings to explain the patient's symptoms. Patient has pulmonary nodules but is a nonsmoker with no history of cancer therefore no follow-up needed. Her vital signs have remained stable here with no fever or hypoxia on repeat exam. She has had no further vomiting and has been able to drink and eat without problems. I gave her some of her home medications including a reduced dose of her evening Levemir. Her lactate normalized after fluids. She has no other evidence of sepsis given reassuring vital signs and she denies any further complaints including no ongoing abdominal pain, therefore I feel she is safe for discharge. Gave zofran and instructed to start probiotics. Tentatively reviewed return precautions with the patient and her husband. They voiced understanding and patient was discharged in satisfactory condition.  Final Clinical Impressions(s) / ED Diagnoses   Final diagnoses:  Nausea vomiting and diarrhea  Dehydration    New Prescriptions New Prescriptions   ONDANSETRON (ZOFRAN ODT) 4 MG DISINTEGRATING TABLET    Take 1 tablet (4 mg total) by mouth every 8 (eight) hours as needed  for nausea or vomiting.     Little, Ambrose Finland, MD 11/10/16 [phone]

## 2016-11-09 NOTE — ED Triage Notes (Signed)
Per [facility] pt from home co NVD this morning, LLQ pain , Hx diverticulitis. sts feels dizzy as well when stood up. EKG unremarkable per EMS. Alert and oriented x 4

## 2016-11-09 NOTE — ED Notes (Signed)
Bed: [facility]19 Expected date:  Expected time:  Means of arrival:  Comments: EMS/n/v/d 

## 2016-11-10 MED ORDER — ONDANSETRON 4 MG PO TBDP: 4.0000 mg | ORAL_TABLET | Freq: Three times a day (TID) | ORAL | 0 refills | Status: DC | PRN

## 2016-11-10 NOTE — Discharge Instructions (Signed)
1. DRINK PLENTY OF FLUIDS AND EAT A SNACK BEFORE BEDTIME TONIGHT. 2. RETURN TO ER IMMEDIATELY IF YOU HAVE WORSENING SYMPTOMS  INCLUDING ONGOING VOMITING, ABDOMINAL PAIN, FEVER, OR BLOODY STOOL.

## 2016-11-12 LAB — URINE CULTURE: Culture: >=100000 — AB

## 2016-11-13 ENCOUNTER — Telehealth: Payer: Self-pay | Admitting: Emergency Medicine

## 2016-11-13 ENCOUNTER — Other Ambulatory Visit: Payer: Self-pay | Admitting: Family Medicine

## 2016-11-13 NOTE — Telephone Encounter (Signed)
Post ED Visit - Positive Culture Follow-up: Successful Patient Follow-Up  Culture assessed and recommendations reviewed by: []  Enzo BiNathan Batchelder, Pharm.D. []  Celedonio MiyamotoJeremy Frens, Pharm.D., BCPS AQ-ID []  Garvin FilaMike Maccia, Pharm.D., BCPS [x]  Georgina PillionElizabeth Martin, Pharm.D., BCPS []  ExcelMinh Pham, [address] Rainbow BoulevardPharm.D., BCPS, AAHIVP []  Estella HuskMichelle Turner, Pharm.D., BCPS, AAHIVP []  Lysle Pearlachel Rumbarger, PharmD, BCPS []  Casilda Carlsaylor Stone, PharmD, BCPS []  Pollyann SamplesAndy Johnston, PharmD, BCPS  Positive urine culture  [x]  Patient discharged without antimicrobial prescription and treatment is now indicated []  Organism is resistant to prescribed ED discharge antimicrobial []  Patient with positive blood cultures  Changes discussed with ED provider: Erma PintoFawse PA New antibiotic prescription + dysuria,start Keflex 500mg  po bid x 7 days Called to [facility]  Contacted patient, 11/13/2016 1547   Berle MullMiller, Campbell Agramonte 11/13/2016, 3:45 PM

## 2016-11-13 NOTE — Progress Notes (Signed)
ED Antimicrobial Stewardship Positive Culture Follow Up   Casey Harrell is an 82 y.o. female who presented to [facility] on 11/09/2016 with a chief complaint of  Chief Complaint  Patient presents with  . Emesis  . Diarrhea    Recent Results (from the past 720 hour(s))  Urine Culture     Status: None   Collection Time: 10/31/16  1:23 PM  Result Value Ref Range Status   Culture ESCHERICHIA COLI  Final   Colony Count Greater than 100,000 CFU/mL  Final   Organism ID, Bacteria ESCHERICHIA COLI  Final      Susceptibility   Escherichia coli -  (no method available)    AMPICILLIN <=2 Sensitive     AMOX/CLAVULANIC <=2 Sensitive     AMPICILLIN/SULBACTAM <=2 Sensitive     PIP/TAZO <=4 Sensitive     IMIPENEM <=0.25 Sensitive     CEFAZOLIN <=4 Not Reportable     CEFTRIAXONE <=1 Sensitive     CEFTAZIDIME <=1 Sensitive     CEFEPIME <=1 Sensitive     GENTAMICIN <=1 Sensitive     TOBRAMYCIN <=1 Sensitive     CIPROFLOXACIN <=0.25 Sensitive     LEVOFLOXACIN <=0.12 Sensitive     NITROFURANTOIN <=16 Sensitive     TRIMETH/SULFA* >=320 Resistant      * NR=NOT REPORTABLE,SEE COMMENTORAL therapy:A cefazolin MIC of <32 predicts susceptibility to the oral agents cefaclor,cefdinir,cefpodoxime,cefprozil,cefuroxime,cephalexin,and loracarbef when used for therapy of uncomplicated UTIs due to E.coli,K.pneumomiae,and P.mirabilis. PARENTERAL therapy: A cefazolinMIC of >8 indicates resistance to parenteralcefazolin. An alternate test method must beperformed to confirm susceptibility to parenteralcefazolin.  Culture, blood (routine x 2)     Status: None (Preliminary result)   Collection Time: 11/09/16  3:53 PM  Result Value Ref Range Status   Specimen Description BLOOD RIGHT HAND  Final   Special Requests   Final    BOTTLES DRAWN AEROBIC AND ANAEROBIC Blood Culture adequate volume   Culture   Final    NO GROWTH 3 DAYS Performed at [facility], [address], [place], [place] [address]    Report Status  PENDING  Incomplete  Culture, blood (routine x 2)     Status: None (Preliminary result)   Collection Time: 11/09/16  4:18 PM  Result Value Ref Range Status   Specimen Description LEFT ANTECUBITAL  Final   Special Requests   Final    BOTTLES DRAWN AEROBIC AND ANAEROBIC Blood Culture adequate volume   Culture   Final    NO GROWTH 3 DAYS Performed at [facility], [address] [place], [place] [address]    Report Status PENDING  Incomplete  Urine culture     Status: Abnormal   Collection Time: 11/09/16  9:42 PM  Result Value Ref Range Status   Specimen Description URINE, CLEAN CATCH  Final   Special Requests NONE  Final   Culture >=100,000 COLONIES/mL ESCHERICHIA COLI (A)  Final   Report Status 11/12/2016 FINAL  Final   Organism ID, Bacteria ESCHERICHIA COLI (A)  Final      Susceptibility   Escherichia coli - MIC*    AMPICILLIN <=2 SENSITIVE Sensitive     CEFAZOLIN <=4 SENSITIVE Sensitive     CEFTRIAXONE <=1 SENSITIVE Sensitive     CIPROFLOXACIN <=0.25 SENSITIVE Sensitive     GENTAMICIN <=1 SENSITIVE Sensitive     IMIPENEM <=0.25 SENSITIVE Sensitive     NITROFURANTOIN <=16 SENSITIVE Sensitive     TRIMETH/SULFA >=320 RESISTANT Resistant  AMPICILLIN/SULBACTAM <=2 SENSITIVE Sensitive     PIP/TAZO <=4 SENSITIVE Sensitive     Extended ESBL NEGATIVE Sensitive     * >=100,000 COLONIES/mL ESCHERICHIA COLI    [x]  Needs additional follow-up  [name] who presented on 7/5 with N/V/D and LLQ pain with 1 week dysuria  Call for symptom check - If still symptomatic of UTI >> Keflex 500 mg twice daily for 7 days - If asymptomatic and improving >> no treatment needed  ED Provider: Michela PitcherMina Fawze, PA-C  Rolley SimsMartin, Javonte Elenes Ann 11/13/2016, 10:10 AM Infectious Diseases Pharmacist Phone# [phone]

## 2016-11-14 LAB — CULTURE, BLOOD (ROUTINE X 2)
Culture: NO GROWTH
Culture: NO GROWTH
Special Requests: ADEQUATE
Special Requests: ADEQUATE

## 2016-11-22 ENCOUNTER — Other Ambulatory Visit: Payer: Self-pay | Admitting: Family Medicine

## 2016-12-04 ENCOUNTER — Other Ambulatory Visit: Payer: Self-pay | Admitting: Family Medicine

## 2016-12-15 ENCOUNTER — Telehealth: Payer: Self-pay | Admitting: Family Medicine

## 2016-12-15 NOTE — Telephone Encounter (Signed)
Pt need new Rx for test strips (Accu Chek)   [facility] [place]

## 2016-12-18 MED ORDER — GLUCOSE BLOOD VI STRP: ORAL_STRIP | 12 refills | Status: DC

## 2016-12-18 MED ORDER — ACCU-CHEK AVIVA DEVI: 0 refills | Status: DC

## 2016-12-18 NOTE — Telephone Encounter (Signed)
Rx sent 

## 2016-12-18 NOTE — Telephone Encounter (Signed)
Pharmacy states pt had to get a new blood sugar machine, so the rx for strips we called in in June willl no longer fit. Pt needs new rx for accu check aviva  [id number] [place], [place] - [address]  Pt is completely out and needs asap.

## 2017-02-07 ENCOUNTER — Telehealth: Payer: Self-pay | Admitting: Family Medicine

## 2017-02-07 ENCOUNTER — Encounter: Payer: Self-pay | Admitting: Family Medicine

## 2017-02-07 ENCOUNTER — Ambulatory Visit (INDEPENDENT_AMBULATORY_CARE_PROVIDER_SITE_OTHER): Payer: Medicare Other | Admitting: Family Medicine

## 2017-02-07 VITALS — BP 140/98 | HR 74 | Temp 97.6°F

## 2017-02-07 DIAGNOSIS — R531 Weakness: Secondary | ICD-10-CM | POA: Diagnosis not present

## 2017-02-07 DIAGNOSIS — R6883 Chills (without fever): Secondary | ICD-10-CM | POA: Diagnosis not present

## 2017-02-07 DIAGNOSIS — R300 Dysuria: Secondary | ICD-10-CM | POA: Diagnosis not present

## 2017-02-07 LAB — POCT URINALYSIS DIPSTICK
Bilirubin, UA: NEGATIVE
Glucose, UA: NEGATIVE
Ketones, UA: NEGATIVE
Nitrite, UA: NEGATIVE
Protein, UA: POSITIVE
Spec Grav, UA: 1.02 (ref 1.010–1.025)
Urobilinogen, UA: 0.2 E.U./dL
pH, UA: 5 (ref 5.0–8.0)

## 2017-02-07 LAB — COMPREHENSIVE METABOLIC PANEL
ALT: 11 U/L (ref 0–35)
AST: 14 U/L (ref 0–37)
Albumin: 4.4 g/dL (ref 3.5–5.2)
Alkaline Phosphatase: 78 U/L (ref 39–117)
BUN: 20 mg/dL (ref 6–23)
CO2: 26 mEq/L (ref 19–32)
Calcium: 10.7 mg/dL — ABNORMAL HIGH (ref 8.4–10.5)
Chloride: 96 mEq/L (ref 96–112)
Creatinine, Ser: 1.2 mg/dL (ref 0.40–1.20)
GFR: 45.97 mL/min — ABNORMAL LOW (ref >60.00)
Glucose, Bld: 115 mg/dL — ABNORMAL HIGH (ref 70–99)
Potassium: 4.3 mEq/L (ref 3.5–5.1)
Sodium: 135 mEq/L (ref 135–145)
Total Bilirubin: 0.6 mg/dL (ref 0.2–1.2)
Total Protein: 7.8 g/dL (ref 6.0–8.3)

## 2017-02-07 LAB — CBC WITH DIFFERENTIAL/PLATELET
Basophils Absolute: 0.1 10*3/uL (ref 0.0–0.1)
Basophils Relative: 0.5 % (ref 0.0–3.0)
Eosinophils Absolute: 0.1 10*3/uL (ref 0.0–0.7)
Eosinophils Relative: 1 % (ref 0.0–5.0)
HCT: 44 % (ref 36.0–46.0)
Hemoglobin: 14.2 g/dL (ref 12.0–15.0)
Lymphocytes Relative: 31.2 % (ref 12.0–46.0)
Lymphs Abs: 3.9 10*3/uL (ref 0.7–4.0)
MCHC: 32.4 g/dL (ref 30.0–36.0)
MCV: 86 fl (ref 78.0–100.0)
Monocytes Absolute: 1.1 10*3/uL — ABNORMAL HIGH (ref 0.1–1.0)
Monocytes Relative: 8.7 % (ref 3.0–12.0)
Neutro Abs: 7.3 10*3/uL (ref 1.4–7.7)
Neutrophils Relative %: 58.6 % (ref 43.0–77.0)
Platelets: 339 10*3/uL (ref 150.0–400.0)
RBC: 5.11 Mil/uL (ref 3.87–5.11)
RDW: 16.1 % — ABNORMAL HIGH (ref 11.5–15.5)
WBC: 12.5 10*3/uL — ABNORMAL HIGH (ref 4.0–10.5)

## 2017-02-07 MED ORDER — CEPHALEXIN 500 MG PO CAPS: 500.0000 mg | ORAL_CAPSULE | Freq: Three times a day (TID) | ORAL | 0 refills | Status: DC

## 2017-02-07 NOTE — Patient Instructions (Signed)
Drink plenty of fluids Start the antibiotic Follow up for any vomiting, increased shortness of breath, or increased confusion.

## 2017-02-07 NOTE — Progress Notes (Signed)
Subjective:     Patient ID: Casey Harrell, female   DOB: 11-Aug-1934, 82 y.o.   MRN: [id number]  HPI Patient is seen with increasing weakness and nonspecific complaint of chills for the past couple days. She has very complicated past history with type 2 diabetes, history of cerebrovascular disease, hypothyroidism, hyperlipidemia, history depression, chronic kidney disease. She is accompanied by husband and daughter.  She has not had any cough or nasal congestion. Denies any sore throat. She has occasional burning with urination. Urine has been little more cloudy No abdominal pain. No skin rash. No headache. No chest pains. No focal weakness.  No nausea, vomiting, diarrhea.  Poor appetite today. Husband took blood sugar 12:30 PM 175  Past Medical History:  Diagnosis Date  . ASTHMA 07/15/2008  . COPD (chronic obstructive pulmonary disease) (HCC)   . DIABETES-TYPE 2 07/15/2008  . DIABETIC PERIPHERAL NEUROPATHY 09/02/2009  . GERD 07/15/2008  . HYPERTENSION 07/15/2008  . Normal cardiac stress test    low risk Nuc 2009, Feb 2011  . Paroxysmal atrial fibrillation (HCC)   . PVD (peripheral vascular disease) (HCC) 06/18/09   RCE  . Stroke (HCC)   . Vertigo    Past Surgical History:  Procedure Laterality Date  . ABDOMINAL HYSTERECTOMY  1971  . APPENDECTOMY  1971  . CAROTID ENDARTERECTOMY  06/18/09   RCE  . CESAREAN SECTION    . colectomy and colostomy    . FLEXIBLE SIGMOIDOSCOPY     diverticulitis  . FOOT SURGERY     right foot X 2  . LAPAROTOMY     X 3 with adhesions lysis  . TONSILLECTOMY    . TUBAL LIGATION      reports that she has never smoked. She has never used smokeless tobacco. She reports that she does not drink alcohol or use drugs. family history includes Arthritis in her mother; Asthma in her sister; Cancer in her paternal grandfather; Cancer (age of onset: 18) in her brother; Celiac disease in her sister; Diabetes in her mother; Heart disease in her brother, father, mother,  sister, and sister; Stroke in her maternal grandfather. Allergies  Allergen Reactions  . Doxycycline Hyclate Nausea And Vomiting  . Morphine Sulfate Other (See Comments)    REACTION: hallucinations  . Iohexol Hives     Code: HIVES, Desc: pt. states she breaks out in hives 06/15/08   . Moxifloxacin Other (See Comments)    REACTION: unknown     Review of Systems  Constitutional: Positive for chills and fatigue. Negative for fever.  HENT: Negative for ear pain and sore throat.   Respiratory: Negative for cough.   Cardiovascular: Negative for chest pain.  Gastrointestinal: Negative for abdominal pain, nausea and vomiting.  Neurological: Positive for weakness. Negative for speech difficulty.       Objective:   Physical Exam  Constitutional: She appears well-developed and well-nourished.  HENT:  Oropharynx slightly dry otherwise clear  Neck: Neck supple.  Cardiovascular: Normal rate.   Pulmonary/Chest: Effort normal and breath sounds normal. No respiratory distress. She has no wheezes. She has no rales.  Abdominal: Soft. She exhibits no mass. There is no tenderness. There is no rebound and no guarding.  Musculoskeletal: She exhibits no edema.  Skin: No rash noted.       Assessment:     Patient has multiple chronic problems as above who presents with one-day history of generalized weakness and chills. Her vital signs are relatively stable with pulse oximetry 98% afebrile and blood  pressure 140/98. Concern for possible UTI. I expressed my concern that she seems to be more confused than usual today    Plan:     -We recommended they consider taking her to ER for further evaluation to get some immediate labs for further feedback. They decline at this point. -Check CBC and comprehensive metabolic panel -Urine culture sent -Start Keflex 500 mg 3 times a day pending culture results -Increase fluid intake -Follow-up immediately for any increasing weakness, vomiting, increasing  confusion, or other concerns  [name] MD [facility]

## 2017-02-07 NOTE — Telephone Encounter (Signed)
Noted  

## 2017-02-07 NOTE — Telephone Encounter (Signed)
[facility] - Client TELEPHONE ADVICE RECORD [facility]  Patient Name: Casey Harrell  DOB: 04-28-1935    Initial Comment Caller states her mom is having chills and sweating at the same time.   Nurse Assessment  Nurse: Odis Luster, RN, Bjorn Loser Date/Time Lamount Cohen Time): 02/07/2017 12:03:10 PM  Confirm and document reason for call. If symptomatic, describe symptoms. ---Caller states her mom is having chills and sweating at the same time. Denies current fever.  Does the patient have any new or worsening symptoms? ---Yes  Will a triage be completed? ---Yes  Related visit to physician within the last 2 weeks? ---No  Does the PT have any chronic conditions? (i.e. diabetes, asthma, etc.) ---Yes  List chronic conditions. ---diabetes; HTN  Is this a behavioral health or substance abuse call? ---No     Guidelines    Guideline Title Affirmed Question Affirmed Notes  Weakness (Generalized) and Fatigue [1] MODERATE weakness (i.e., interferes with work, school, normal activities) AND [2] persists > 3 days    Final Disposition User   See Physician within 24 Hours [place], RN, Bjorn Loser    Comments  Scheduled caller to see Dr. Caryl Never at the [place] office at 2:15pm today.   Referrals  REFERRED TO PCP OFFICE   Caller Disagree/Comply Comply  Caller Understands Yes  PreDisposition Did not know what to do

## 2017-02-08 ENCOUNTER — Other Ambulatory Visit: Payer: Self-pay | Admitting: Family Medicine

## 2017-02-08 ENCOUNTER — Telehealth: Payer: Self-pay | Admitting: Family Medicine

## 2017-02-08 NOTE — Telephone Encounter (Signed)
See results note. 

## 2017-02-08 NOTE — Telephone Encounter (Signed)
FYI per pt husband please call pt son Casey Harrell or daughter Casey Harrell sugg with his wife blood work results.

## 2017-02-10 LAB — URINE CULTURE
MICRO NUMBER:: 81098451
SPECIMEN QUALITY:: ADEQUATE

## 2017-02-27 ENCOUNTER — Other Ambulatory Visit: Payer: Self-pay | Admitting: Family Medicine

## 2017-02-27 NOTE — Telephone Encounter (Signed)
Okay to refill lyrical?

## 2017-02-27 NOTE — Telephone Encounter (Signed)
Daughter states last time pt got her  glucose blood (ACCU-CHEK AVIVA) test strip  It was not states pt test BID. So now the pharmacy will not refill because they say it is too early.  Pt is out of her strips now.  Also needs LYRICA 150 MG capsule  [id number] [place], [place] - [address]

## 2017-02-27 NOTE — Telephone Encounter (Signed)
Refill for 6 months. 

## 2017-02-28 MED ORDER — GLUCOSE BLOOD VI STRP: ORAL_STRIP | 12 refills | Status: DC

## 2017-02-28 NOTE — Telephone Encounter (Signed)
Rx called in/ sent.

## 2017-03-01 ENCOUNTER — Other Ambulatory Visit: Payer: Self-pay | Admitting: Family Medicine

## 2017-03-01 NOTE — Telephone Encounter (Signed)
Rx done. 

## 2017-03-01 NOTE — Telephone Encounter (Signed)
Last rx given on 4/30 for #180 with 1 ref

## 2017-03-01 NOTE — Telephone Encounter (Signed)
Refills OK. 

## 2017-03-04 ENCOUNTER — Other Ambulatory Visit: Payer: Self-pay | Admitting: Family Medicine

## 2017-03-07 ENCOUNTER — Other Ambulatory Visit: Payer: Self-pay | Admitting: Family Medicine

## 2017-03-13 ENCOUNTER — Other Ambulatory Visit: Payer: Self-pay | Admitting: *Deleted

## 2017-03-13 MED ORDER — "PEN NEEDLES 5/16"" 31G X 8 MM MISC": 3 refills | Status: DC

## 2017-03-29 ENCOUNTER — Emergency Department (HOSPITAL_COMMUNITY): Payer: Medicare Other

## 2017-03-29 ENCOUNTER — Other Ambulatory Visit: Payer: Self-pay

## 2017-03-29 ENCOUNTER — Inpatient Hospital Stay (HOSPITAL_COMMUNITY)
Admission: EM | Admit: 2017-03-29 | Discharge: 2017-04-03 | DRG: 065 | Disposition: A | Discharge status: Rehabilitation Facility | Payer: Medicare Other | Attending: Internal Medicine | Admitting: Internal Medicine

## 2017-03-29 ENCOUNTER — Encounter (HOSPITAL_COMMUNITY): Payer: Self-pay

## 2017-03-29 DIAGNOSIS — R51 Headache: Secondary | ICD-10-CM | POA: Diagnosis not present

## 2017-03-29 DIAGNOSIS — M25471 Effusion, right ankle: Secondary | ICD-10-CM | POA: Diagnosis not present

## 2017-03-29 DIAGNOSIS — E1165 Type 2 diabetes mellitus with hyperglycemia: Secondary | ICD-10-CM | POA: Diagnosis not present

## 2017-03-29 DIAGNOSIS — N182 Chronic kidney disease, stage 2 (mild): Secondary | ICD-10-CM | POA: Diagnosis not present

## 2017-03-29 DIAGNOSIS — I480 Paroxysmal atrial fibrillation: Secondary | ICD-10-CM | POA: Diagnosis not present

## 2017-03-29 DIAGNOSIS — D649 Anemia, unspecified: Secondary | ICD-10-CM | POA: Diagnosis not present

## 2017-03-29 DIAGNOSIS — R29810 Facial weakness: Secondary | ICD-10-CM | POA: Diagnosis not present

## 2017-03-29 DIAGNOSIS — E860 Dehydration: Secondary | ICD-10-CM | POA: Diagnosis not present

## 2017-03-29 DIAGNOSIS — R29709 NIHSS score 9: Secondary | ICD-10-CM | POA: Diagnosis not present

## 2017-03-29 DIAGNOSIS — E785 Hyperlipidemia, unspecified: Secondary | ICD-10-CM | POA: Diagnosis not present

## 2017-03-29 DIAGNOSIS — Z8673 Personal history of transient ischemic attack (TIA), and cerebral infarction without residual deficits: Secondary | ICD-10-CM

## 2017-03-29 DIAGNOSIS — E1122 Type 2 diabetes mellitus with diabetic chronic kidney disease: Secondary | ICD-10-CM | POA: Diagnosis not present

## 2017-03-29 DIAGNOSIS — Z91041 Radiographic dye allergy status: Secondary | ICD-10-CM

## 2017-03-29 DIAGNOSIS — Z823 Family history of stroke: Secondary | ICD-10-CM

## 2017-03-29 DIAGNOSIS — Z9111 Patient's noncompliance with dietary regimen: Secondary | ICD-10-CM

## 2017-03-29 DIAGNOSIS — Z794 Long term (current) use of insulin: Secondary | ICD-10-CM

## 2017-03-29 DIAGNOSIS — I63019 Cerebral infarction due to thrombosis of unspecified vertebral artery: Secondary | ICD-10-CM | POA: Diagnosis not present

## 2017-03-29 DIAGNOSIS — M791 Myalgia, unspecified site: Secondary | ICD-10-CM | POA: Diagnosis present

## 2017-03-29 DIAGNOSIS — R791 Abnormal coagulation profile: Secondary | ICD-10-CM | POA: Diagnosis present

## 2017-03-29 DIAGNOSIS — R4781 Slurred speech: Secondary | ICD-10-CM | POA: Diagnosis present

## 2017-03-29 DIAGNOSIS — E119 Type 2 diabetes mellitus without complications: Secondary | ICD-10-CM

## 2017-03-29 DIAGNOSIS — I6302 Cerebral infarction due to thrombosis of basilar artery: Secondary | ICD-10-CM

## 2017-03-29 DIAGNOSIS — R471 Dysarthria and anarthria: Secondary | ICD-10-CM | POA: Diagnosis present

## 2017-03-29 DIAGNOSIS — Z9851 Tubal ligation status: Secondary | ICD-10-CM

## 2017-03-29 DIAGNOSIS — I672 Cerebral atherosclerosis: Secondary | ICD-10-CM | POA: Diagnosis present

## 2017-03-29 DIAGNOSIS — S99911A Unspecified injury of right ankle, initial encounter: Secondary | ICD-10-CM | POA: Diagnosis present

## 2017-03-29 DIAGNOSIS — Z801 Family history of malignant neoplasm of trachea, bronchus and lung: Secondary | ICD-10-CM

## 2017-03-29 DIAGNOSIS — I639 Cerebral infarction, unspecified: Secondary | ICD-10-CM | POA: Diagnosis not present

## 2017-03-29 DIAGNOSIS — I129 Hypertensive chronic kidney disease with stage 1 through stage 4 chronic kidney disease, or unspecified chronic kidney disease: Secondary | ICD-10-CM | POA: Diagnosis not present

## 2017-03-29 DIAGNOSIS — E039 Hypothyroidism, unspecified: Secondary | ICD-10-CM | POA: Diagnosis not present

## 2017-03-29 DIAGNOSIS — I69351 Hemiplegia and hemiparesis following cerebral infarction affecting right dominant side: Secondary | ICD-10-CM | POA: Diagnosis not present

## 2017-03-29 DIAGNOSIS — J449 Chronic obstructive pulmonary disease, unspecified: Secondary | ICD-10-CM | POA: Diagnosis present

## 2017-03-29 DIAGNOSIS — R29707 NIHSS score 7: Secondary | ICD-10-CM | POA: Diagnosis not present

## 2017-03-29 DIAGNOSIS — W19XXXA Unspecified fall, initial encounter: Secondary | ICD-10-CM | POA: Diagnosis present

## 2017-03-29 DIAGNOSIS — Z79890 Hormone replacement therapy (postmenopausal): Secondary | ICD-10-CM

## 2017-03-29 DIAGNOSIS — M79671 Pain in right foot: Secondary | ICD-10-CM | POA: Diagnosis not present

## 2017-03-29 DIAGNOSIS — K219 Gastro-esophageal reflux disease without esophagitis: Secondary | ICD-10-CM | POA: Diagnosis not present

## 2017-03-29 DIAGNOSIS — Z79899 Other long term (current) drug therapy: Secondary | ICD-10-CM

## 2017-03-29 DIAGNOSIS — E1151 Type 2 diabetes mellitus with diabetic peripheral angiopathy without gangrene: Secondary | ICD-10-CM | POA: Diagnosis not present

## 2017-03-29 DIAGNOSIS — Z833 Family history of diabetes mellitus: Secondary | ICD-10-CM

## 2017-03-29 DIAGNOSIS — Z7901 Long term (current) use of anticoagulants: Secondary | ICD-10-CM

## 2017-03-29 DIAGNOSIS — I6789 Other cerebrovascular disease: Secondary | ICD-10-CM | POA: Diagnosis not present

## 2017-03-29 DIAGNOSIS — Z8249 Family history of ischemic heart disease and other diseases of the circulatory system: Secondary | ICD-10-CM

## 2017-03-29 DIAGNOSIS — E1142 Type 2 diabetes mellitus with diabetic polyneuropathy: Secondary | ICD-10-CM | POA: Diagnosis present

## 2017-03-29 DIAGNOSIS — F329 Major depressive disorder, single episode, unspecified: Secondary | ICD-10-CM | POA: Diagnosis present

## 2017-03-29 DIAGNOSIS — Z885 Allergy status to narcotic agent status: Secondary | ICD-10-CM

## 2017-03-29 DIAGNOSIS — Z825 Family history of asthma and other chronic lower respiratory diseases: Secondary | ICD-10-CM

## 2017-03-29 DIAGNOSIS — I6930 Unspecified sequelae of cerebral infarction: Secondary | ICD-10-CM

## 2017-03-29 DIAGNOSIS — Z881 Allergy status to other antibiotic agents status: Secondary | ICD-10-CM

## 2017-03-29 DIAGNOSIS — Z933 Colostomy status: Secondary | ICD-10-CM

## 2017-03-29 DIAGNOSIS — H8110 Benign paroxysmal vertigo, unspecified ear: Secondary | ICD-10-CM | POA: Diagnosis present

## 2017-03-29 DIAGNOSIS — R1310 Dysphagia, unspecified: Secondary | ICD-10-CM | POA: Diagnosis present

## 2017-03-29 DIAGNOSIS — I69992 Facial weakness following unspecified cerebrovascular disease: Secondary | ICD-10-CM | POA: Diagnosis not present

## 2017-03-29 DIAGNOSIS — M25571 Pain in right ankle and joints of right foot: Secondary | ICD-10-CM | POA: Diagnosis not present

## 2017-03-29 DIAGNOSIS — R531 Weakness: Secondary | ICD-10-CM | POA: Diagnosis not present

## 2017-03-29 DIAGNOSIS — Z90710 Acquired absence of both cervix and uterus: Secondary | ICD-10-CM

## 2017-03-29 DIAGNOSIS — K5900 Constipation, unspecified: Secondary | ICD-10-CM | POA: Diagnosis not present

## 2017-03-29 DIAGNOSIS — IMO0002 Reserved for concepts with insufficient information to code with codable children: Secondary | ICD-10-CM | POA: Diagnosis present

## 2017-03-29 LAB — PROTIME-INR
INR: 1.12
Prothrombin Time: 14.3 seconds (ref 11.4–15.2)

## 2017-03-29 LAB — CBC
HCT: 35.5 % — ABNORMAL LOW (ref 36.0–46.0)
Hemoglobin: 11.6 g/dL — ABNORMAL LOW (ref 12.0–15.0)
MCH: 27.8 pg (ref 26.0–34.0)
MCHC: 32.7 g/dL (ref 30.0–36.0)
MCV: 84.9 fL (ref 78.0–100.0)
Platelets: 258 10*3/uL (ref 150–400)
RBC: 4.18 MIL/uL (ref 3.87–5.11)
RDW: 15.5 % (ref 11.5–15.5)
WBC: 14.7 10*3/uL — ABNORMAL HIGH (ref 4.0–10.5)

## 2017-03-29 LAB — DIFFERENTIAL
Basophils Absolute: 0 10*3/uL (ref 0.0–0.1)
Basophils Relative: 0 %
Eosinophils Absolute: 0.4 10*3/uL (ref 0.0–0.7)
Eosinophils Relative: 3 %
Lymphocytes Relative: 21 %
Lymphs Abs: 3.1 10*3/uL (ref 0.7–4.0)
Monocytes Absolute: 1.4 10*3/uL — ABNORMAL HIGH (ref 0.1–1.0)
Monocytes Relative: 10 %
Neutro Abs: 9.7 10*3/uL — ABNORMAL HIGH (ref 1.7–7.7)
Neutrophils Relative %: 66 %

## 2017-03-29 LAB — I-STAT TROPONIN, ED: Troponin i, poc: 0 ng/mL (ref 0.00–0.08)

## 2017-03-29 LAB — COMPREHENSIVE METABOLIC PANEL
ALT: 12 U/L — ABNORMAL LOW (ref 14–54)
AST: 22 U/L (ref 15–41)
Albumin: 3.7 g/dL (ref 3.5–5.0)
Alkaline Phosphatase: 66 U/L (ref 38–126)
Anion gap: 12 (ref 5–15)
BUN: 28 mg/dL — ABNORMAL HIGH (ref 6–20)
CO2: 22 mmol/L (ref 22–32)
Calcium: 9.1 mg/dL (ref 8.9–10.3)
Chloride: 101 mmol/L (ref 101–111)
Creatinine, Ser: 1.18 mg/dL — ABNORMAL HIGH (ref 0.44–1.00)
GFR calc Af Amer: 49 mL/min — ABNORMAL LOW (ref >60)
GFR calc non Af Amer: 43 mL/min — ABNORMAL LOW (ref >60)
Glucose, Bld: 269 mg/dL — ABNORMAL HIGH (ref 65–99)
Potassium: 4.1 mmol/L (ref 3.5–5.1)
Sodium: 135 mmol/L (ref 135–145)
Total Bilirubin: 0.7 mg/dL (ref 0.3–1.2)
Total Protein: 6.9 g/dL (ref 6.5–8.1)

## 2017-03-29 LAB — I-STAT CHEM 8, ED
BUN: 33 mg/dL — ABNORMAL HIGH (ref 6–20)
Calcium, Ion: 1.08 mmol/L — ABNORMAL LOW (ref 1.15–1.40)
Chloride: 102 mmol/L (ref 101–111)
Creatinine, Ser: 1 mg/dL (ref 0.44–1.00)
Glucose, Bld: 259 mg/dL — ABNORMAL HIGH (ref 65–99)
HCT: 40 % (ref 36.0–46.0)
Hemoglobin: 13.6 g/dL (ref 12.0–15.0)
Potassium: 4.1 mmol/L (ref 3.5–5.1)
Sodium: 136 mmol/L (ref 135–145)
TCO2: 23 mmol/L (ref 22–32)

## 2017-03-29 LAB — APTT: aPTT: 32 seconds (ref 24–36)

## 2017-03-29 LAB — ETHANOL: Alcohol, Ethyl (B): <10 mg/dL (ref <10)

## 2017-03-29 NOTE — ED Notes (Signed)
Patient taken to [facility],  Will attempt to have CT done while in radiology

## 2017-03-29 NOTE — ED Triage Notes (Signed)
Patient from home with progressive weakness over the day.  Losing control of fine motor skills throughout today.  Hx of stroke and right sided weakness.  Having pronounced right sided weakness.  PA at bedside. A&Ox4. Lethargic

## 2017-03-29 NOTE — ED Provider Notes (Signed)
[facility] EM[facility]GENCY [facility]PART[facility] Provider Note   CSN: [id number] Arrival date & time: 03/29/17  2209     History   Chief Complaint Chief Complaint  Patient presents with  . Weakness    LSN 0800 03/29/17    HPI Casey Harrell is a 82 y.o. female.  Patient with a history of atrial fibrillation, HTN, CVA, COPD, T2DM, GERD, coagulopathy on Eliquis presents with progressive right sided weakness that she first noticed this morning when she got up. Symptoms worsened throughout the day until dinner tonight when she lost fine motor coordination while eating dinner. She denies headache or other pain, nausea, vomiting, visual changes. No CP, SOB. No recent fever. She reports she fell this morning but can't remember the details of the fall. She has right heel and ankle pain since the fall and has not been weight bearing since.    The history is provided by the patient and the EMS personnel. No language interpreter was used.    Past Medical History:  Diagnosis Date  . ASTHMA 07/15/2008  . COPD (chronic obstructive pulmonary disease) (HCC)   . DIABETES-TYPE 2 07/15/2008  . DIABETIC PERIPHERAL NEUROPATHY 09/02/2009  . GERD 07/15/2008  . HYPERTENSION 07/15/2008  . Normal cardiac stress test    low risk Nuc 2009, Feb 2011  . Paroxysmal atrial fibrillation (HCC)   . PVD (peripheral vascular disease) (HCC) 06/18/09   RCE  . Stroke (HCC)   . Vertigo     Patient Active Problem List   Diagnosis Date Noted  . Chest pain 02/09/2016  . BPPV (benign paroxysmal positional vertigo) 08/25/2015  . HLD (hyperlipidemia) 08/25/2015  . At high risk for falls 07/01/2015  . Vertigo 06/21/2015  . Paroxysmal atrial fibrillation (HCC) 06/11/2015  . Cerebral infarction due to stenosis of left middle cerebral artery (HCC) 05/20/2015  . Intracranial vascular stenosis 05/20/2015  . Type 2 diabetes mellitus with circulatory disorder (HCC) 05/20/2015  . Type 2 diabetes, uncontrolled, with  neuropathy (HCC)   . Abdominal pain   . Partial small bowel obstruction (HCC)   . Benign essential HTN   . Small bowel obstruction (HCC) 05/07/2015  . Hemiplegia, unspecified affecting right dominant side (HCC) 04/08/2015  . Embolic stroke involving posterior cerebral artery (HCC) 04/05/2015  . Orthostatic hypotension   . Hypertensive urgency 04/01/2015  . Fever in adult 04/01/2015  . Acute encephalopathy 04/01/2015  . Confusion   . CVA (cerebral infarction)   . Posterior circulation stroke (HCC)   . Acute ischemic stroke (HCC) 03/31/2015  . Stroke (HCC) 03/31/2015  . CKD (chronic kidney disease) stage 3, GFR 30-59 ml/min (HCC) 04/17/2014  . Gastroenteritis, non-infectious 04/11/2014  . Nausea vomiting and diarrhea 04/10/2014  . Hypokalemia 04/10/2014  . Dehydration 04/09/2014  . Bronchitis with airway obstruction (HCC) 02/03/2013  . Leukocytosis 02/03/2013  . Fever 02/03/2013  . Acute respiratory failure with hypoxia (HCC) 02/03/2013  . Depression 02/03/2013  . Hyponatremia 02/03/2013  . Obesity (BMI 30-39.9) 01/10/2013  . PVD (peripheral vascular disease)- S/P RCE 06/18/09 12/13/2012  . Sleep apnea- C-pap intol 12/13/2012  . Low risk cardiac nuclear stress test- 2009 and Feb 2011 12/13/2012  . Mild persistent asthma 11/29/2011  . Chronic cough 06/05/2011  . Diabetic neuropathy (HCC) 09/02/2009  . Dyslipidemia 07/13/2009  . Hypothyroidism 11/12/2008  . Type 2 diabetes mellitus, uncontrolled (HCC) 07/15/2008  . Essential hypertension 07/15/2008  . GERD 07/15/2008  . DIVERTICULOSIS, COLON 07/15/2008    Past Surgical History:  Procedure Laterality Date  .  ABDOMINAL HYSTERECTOMY  1971  . APPENDECTOMY  1971  . CAROTID ENDARTERECTOMY  06/18/09   RCE  . CESAREAN SECTION    . colectomy and colostomy    . FLEXIBLE SIGMOIDOSCOPY     diverticulitis  . FOOT SURGERY     right foot X 2  . LAPAROTOMY     X 3 with adhesions lysis  . TONSILLECTOMY    . TUBAL LIGATION      OB  History    No data available       Home Medications    Prior to Admission medications   Medication Sig Start Date End Date Taking? Authorizing Provider  acetaminophen (TYLENOL) 325 MG tablet Take 650 mg by mouth every 6 (six) hours as needed (pain).    [provider]  albuterol (ACCUNEB) 1.25 MG/3ML nebulizer solution Take 3 mLs (1.25 mg total) by nebulization every 4 (four) hours as needed for wheezing. Reported on 04/26/2015 05/13/15   Jeralyn BennettZamora, Ezequiel, MD  apixaban (ELIQUIS) 5 MG TABS tablet TAKE 1 TABLET(5 MG) BY MOUTH TWICE DAILY 05/05/16   Runell GessBerry, Jonathan J, MD  atorvastatin (LIPITOR) 40 MG tablet TAKE 1 TABLET BY MOUTH EVERY DAY 03/01/16   Shelva MajesticHunter, Stephen O, MD  Blood Glucose Monitoring Suppl (ACCU-CHEK AVIVA) device Use as instructed.  Dx E11.9 12/18/16   Kristian CoveyBurchette, Bruce W, MD  CALCIUM PO Take 1 tablet by mouth daily.    [provider]  carvedilol (COREG) 6.25 MG tablet TAKE 1 TABLET BY MOUTH TWICE A DAY WITH A MEAL 12/04/16   Burchette, Elberta FortisBruce W, MD  cephALEXin (KEFLEX) 500 MG capsule Take 1 capsule (500 mg total) by mouth 3 (three) times daily. 02/07/17   Burchette, Elberta FortisBruce W, MD  Cholecalciferol (VITAMIN D3) 2000 UNITS TABS Take 2,000 Units by mouth daily. Reported on 04/26/2015    [provider]  ciprofloxacin (CIPRO) 500 MG tablet Take 1 tablet (500 mg total) by mouth 2 (two) times daily. 11/03/16   Gordy SaversKwiatkowski, Peter F, MD  DULoxetine (CYMBALTA) 30 MG capsule TAKE 1 CAPSULE BY MOUTH EVERY DAY FOR 2 WEEKS, THEN INCREASE TO 1 CAPSULE TWICE A DAY 03/01/17   Burchette, Elberta FortisBruce W, MD  glucose blood (ACCU-CHEK AVIVA) test strip Test twice daily. Dx E11.9 02/28/17   Kristian CoveyBurchette, Bruce W, MD  HUMALOG KWIKPEN 100 UNIT/ML KiwkPen INJECT 9-14 UNITS SUBCUTANEOUSLY TWICE DAILY WITH MEAL 9 UNITS BREAKFAST AND 14 UNITS AFTER SUPPER 03/07/17   Burchette, Elberta FortisBruce W, MD  Insulin Pen Needle (PEN NEEDLES 31GX5/16") 31G X 8 MM MISC Use once daily 03/13/17   Burchette, Elberta FortisBruce W, MD    ipratropium-albuterol (DUONEB) 0.5-2.5 (3) MG/3ML SOLN Take 3 mLs by nebulization every 6 (six) hours as needed (shortness of breath or wheezing). 04/20/15   Love, Evlyn KannerPamela S, PA-C  irbesartan (AVAPRO) 150 MG tablet TAKE 1 TABLET BY MOUTH EVERY DAY 11/13/16   Burchette, Elberta FortisBruce W, MD  LEVEMIR FLEXTOUCH 100 UNIT/ML Pen INJECT 40 UNITS INTO THE SKIN TWICE A DAY Patient taking differently: INJECT 30 UNITS INTO THE SKIN TWICE A DAY 05/29/16   Burchette, Elberta FortisBruce W, MD  levothyroxine (SYNTHROID, LEVOTHROID) 50 MCG tablet TAKE 1 TABLET BY MOUTH EVERY DAY BEFORE BREAKFAST 03/05/17   Burchette, Elberta FortisBruce W, MD  LYRICA 150 MG capsule TAKE ONE CAPSULE BY MOUTH EVERY DAY 03/05/17   Burchette, Elberta FortisBruce W, MD  meclizine (ANTIVERT) 25 MG tablet Take 1 tablet (25 mg total) by mouth 3 (three) times daily as needed for dizziness. 06/28/16   Burchette, Elberta FortisBruce W,  MD  metFORMIN (GLUCOPHAGE) 1000 MG tablet TAKE 1 TABLET BY MOUTH TWICE A DAY WITH A MEAL 02/13/17   Burchette, Elberta Fortis, MD  ondansetron (ZOFRAN ODT) 4 MG disintegrating tablet Take 1 tablet (4 mg total) by mouth every 8 (eight) hours as needed for nausea or vomiting. 11/10/16   Little, Ambrose Finland, MD  ondansetron (ZOFRAN) 4 MG tablet Take 1 tablet (4 mg total) by mouth every 8 (eight) hours as needed for nausea or vomiting. 10/31/16   Gordy Savers, MD  pantoprazole (PROTONIX) 40 MG tablet TAKE 1 TABLET BY MOUTH TWICE A DAY 11/22/16   Burchette, Elberta Fortis, MD  pregabalin (LYRICA) 100 MG capsule Take 1 capsule (100 mg total) by mouth daily. 02/25/16   Burchette, Elberta Fortis, MD    Family History Family History  Problem Relation Age of Onset  . Diabetes Mother   . Arthritis Mother   . Heart disease Mother   . Heart disease Father   . Heart disease Sister   . Heart disease Brother   . Cancer Brother 60       lung cancer  . Celiac disease Sister   . Stroke Maternal Grandfather   . Cancer Paternal Grandfather        lung  . Heart disease Sister   . Asthma Sister      Social History Social History   Tobacco Use  . Smoking status: Never Smoker  . Smokeless tobacco: Never Used  Substance Use Topics  . Alcohol use: No  . Drug use: No     Allergies   Doxycycline hyclate; Morphine sulfate; Iohexol; and Moxifloxacin   Review of Systems Review of Systems  Constitutional: Negative for chills, diaphoresis and fever.  HENT: Negative.   Respiratory: Negative.  Negative for shortness of breath.   Cardiovascular: Negative.  Negative for chest pain.  Gastrointestinal: Negative.  Negative for abdominal pain and nausea.  Musculoskeletal: Negative.   Skin: Negative.   Neurological: Positive for facial asymmetry and weakness.  Psychiatric/Behavioral: Negative for confusion.     Physical Exam Updated Vital Signs There were no vitals taken for this visit.  Physical Exam  Constitutional: She is oriented to person, place, and time. She appears well-developed and well-nourished.  HENT:  Head: Normocephalic.  Neck: Normal range of motion. Neck supple.  Cardiovascular: Normal rate and regular rhythm.  No carotid bruit.  Pulmonary/Chest: Effort normal and breath sounds normal. She has no wheezes. She has no rales.  Abdominal: Soft. Bowel sounds are normal. There is no tenderness. There is no rebound and no guarding.  Musculoskeletal: Normal range of motion.  Neurological: She is alert and oriented to person, place, and time. She displays normal reflexes. No sensory deficit.  Right facial weakness. Right grip weakness that is profound, "weaker" than her usual. Alert and oriented x 3. No sensory deficit to light touch. Reflexes equal.   Skin: Skin is warm and dry. No rash noted.  Psychiatric: She has a normal mood and affect.     ED Treatments / Results  Labs (all labs ordered are listed, but only abnormal results are displayed) Labs Reviewed  ETHANOL  PROTIME-INR  APTT  CBC  DIFFERENTIAL  COMPREHENSIVE METABOLIC PANEL  RAPID URINE DRUG  SCREEN, HOSP PERFORMED  URINALYSIS, ROUTINE W REFLEX MICROSCOPIC  I-STAT CHEM 8, ED  I-STAT TROPONIN, ED    EKG  EKG Interpretation None       Radiology No results found.  Procedures Procedures (including critical care time)  Medications Ordered in ED Medications - No data to display   Initial Impression / Assessment and Plan / ED Course  I have reviewed the triage vital signs and the nursing notes.  Pertinent labs & imaging results that were available during my care of the patient were reviewed by me and considered in my medical decision making (see chart for details).     Patient with complicated medical history presents with new weakness, progressive throughout today. Right ankle injury this morning after fall, with unknown mechanism.   She has profound right sided weakness. She remains alert, oriented, follows command. Not considered code given time of perceived onset of symptoms.   Labs, imaging, neuro consult pending. Discussed with Dr. Adela Lank.  Head CT negative for acute finding. Discussed with neurology who will see the patient. Dr. Selena Batten, [facility], accepts the patient for admission to his service. MR MRA brain, MRA neck pending.  Final Clinical Impressions(s) / ED Diagnoses   Final diagnoses:  None   1. Stroke 2. Coagulopathy  ED Discharge Orders    None       Elpidio Anis, PA-C 03/30/17 0014    Melene Plan, DO 03/30/17 1601

## 2017-03-30 ENCOUNTER — Other Ambulatory Visit: Payer: Self-pay

## 2017-03-30 ENCOUNTER — Inpatient Hospital Stay (HOSPITAL_COMMUNITY): Payer: Medicare Other

## 2017-03-30 ENCOUNTER — Encounter (HOSPITAL_COMMUNITY): Payer: Self-pay | Admitting: Internal Medicine

## 2017-03-30 DIAGNOSIS — E1142 Type 2 diabetes mellitus with diabetic polyneuropathy: Secondary | ICD-10-CM | POA: Diagnosis not present

## 2017-03-30 DIAGNOSIS — E039 Hypothyroidism, unspecified: Secondary | ICD-10-CM | POA: Diagnosis not present

## 2017-03-30 DIAGNOSIS — I361 Nonrheumatic tricuspid (valve) insufficiency: Secondary | ICD-10-CM | POA: Diagnosis not present

## 2017-03-30 DIAGNOSIS — I63019 Cerebral infarction due to thrombosis of unspecified vertebral artery: Secondary | ICD-10-CM

## 2017-03-30 DIAGNOSIS — R29709 NIHSS score 9: Secondary | ICD-10-CM | POA: Diagnosis not present

## 2017-03-30 DIAGNOSIS — E1165 Type 2 diabetes mellitus with hyperglycemia: Secondary | ICD-10-CM | POA: Diagnosis present

## 2017-03-30 DIAGNOSIS — I6930 Unspecified sequelae of cerebral infarction: Secondary | ICD-10-CM | POA: Diagnosis not present

## 2017-03-30 DIAGNOSIS — Z801 Family history of malignant neoplasm of trachea, bronchus and lung: Secondary | ICD-10-CM | POA: Diagnosis not present

## 2017-03-30 DIAGNOSIS — Z8673 Personal history of transient ischemic attack (TIA), and cerebral infarction without residual deficits: Secondary | ICD-10-CM

## 2017-03-30 DIAGNOSIS — R471 Dysarthria and anarthria: Secondary | ICD-10-CM | POA: Diagnosis present

## 2017-03-30 DIAGNOSIS — R29810 Facial weakness: Secondary | ICD-10-CM | POA: Diagnosis present

## 2017-03-30 DIAGNOSIS — I6350 Cerebral infarction due to unspecified occlusion or stenosis of unspecified cerebral artery: Secondary | ICD-10-CM | POA: Diagnosis not present

## 2017-03-30 DIAGNOSIS — G8191 Hemiplegia, unspecified affecting right dominant side: Secondary | ICD-10-CM | POA: Diagnosis not present

## 2017-03-30 DIAGNOSIS — R51 Headache: Secondary | ICD-10-CM | POA: Diagnosis not present

## 2017-03-30 DIAGNOSIS — Z794 Long term (current) use of insulin: Secondary | ICD-10-CM | POA: Diagnosis not present

## 2017-03-30 DIAGNOSIS — I672 Cerebral atherosclerosis: Secondary | ICD-10-CM | POA: Diagnosis present

## 2017-03-30 DIAGNOSIS — Z823 Family history of stroke: Secondary | ICD-10-CM | POA: Diagnosis not present

## 2017-03-30 DIAGNOSIS — I69319 Unspecified symptoms and signs involving cognitive functions following cerebral infarction: Secondary | ICD-10-CM | POA: Diagnosis not present

## 2017-03-30 DIAGNOSIS — N182 Chronic kidney disease, stage 2 (mild): Secondary | ICD-10-CM | POA: Diagnosis present

## 2017-03-30 DIAGNOSIS — Z7901 Long term (current) use of anticoagulants: Secondary | ICD-10-CM | POA: Diagnosis not present

## 2017-03-30 DIAGNOSIS — W19XXXA Unspecified fall, initial encounter: Secondary | ICD-10-CM | POA: Diagnosis present

## 2017-03-30 DIAGNOSIS — E785 Hyperlipidemia, unspecified: Secondary | ICD-10-CM | POA: Diagnosis not present

## 2017-03-30 DIAGNOSIS — I6302 Cerebral infarction due to thrombosis of basilar artery: Secondary | ICD-10-CM | POA: Diagnosis not present

## 2017-03-30 DIAGNOSIS — R791 Abnormal coagulation profile: Secondary | ICD-10-CM | POA: Diagnosis present

## 2017-03-30 DIAGNOSIS — R29707 NIHSS score 7: Secondary | ICD-10-CM | POA: Diagnosis present

## 2017-03-30 DIAGNOSIS — D649 Anemia, unspecified: Secondary | ICD-10-CM

## 2017-03-30 DIAGNOSIS — I639 Cerebral infarction, unspecified: Secondary | ICD-10-CM | POA: Diagnosis present

## 2017-03-30 DIAGNOSIS — S93401D Sprain of unspecified ligament of right ankle, subsequent encounter: Secondary | ICD-10-CM | POA: Diagnosis not present

## 2017-03-30 DIAGNOSIS — Z8261 Family history of arthritis: Secondary | ICD-10-CM | POA: Diagnosis not present

## 2017-03-30 DIAGNOSIS — R531 Weakness: Secondary | ICD-10-CM | POA: Diagnosis not present

## 2017-03-30 DIAGNOSIS — H8110 Benign paroxysmal vertigo, unspecified ear: Secondary | ICD-10-CM

## 2017-03-30 DIAGNOSIS — I69351 Hemiplegia and hemiparesis following cerebral infarction affecting right dominant side: Secondary | ICD-10-CM | POA: Diagnosis not present

## 2017-03-30 DIAGNOSIS — Z8249 Family history of ischemic heart disease and other diseases of the circulatory system: Secondary | ICD-10-CM | POA: Diagnosis not present

## 2017-03-30 DIAGNOSIS — Z833 Family history of diabetes mellitus: Secondary | ICD-10-CM | POA: Diagnosis not present

## 2017-03-30 DIAGNOSIS — K219 Gastro-esophageal reflux disease without esophagitis: Secondary | ICD-10-CM | POA: Diagnosis not present

## 2017-03-30 DIAGNOSIS — E1151 Type 2 diabetes mellitus with diabetic peripheral angiopathy without gangrene: Secondary | ICD-10-CM | POA: Diagnosis not present

## 2017-03-30 DIAGNOSIS — Z8379 Family history of other diseases of the digestive system: Secondary | ICD-10-CM | POA: Diagnosis not present

## 2017-03-30 DIAGNOSIS — Z79899 Other long term (current) drug therapy: Secondary | ICD-10-CM | POA: Diagnosis not present

## 2017-03-30 DIAGNOSIS — R4781 Slurred speech: Secondary | ICD-10-CM | POA: Diagnosis present

## 2017-03-30 DIAGNOSIS — I69328 Other speech and language deficits following cerebral infarction: Secondary | ICD-10-CM | POA: Diagnosis not present

## 2017-03-30 DIAGNOSIS — I129 Hypertensive chronic kidney disease with stage 1 through stage 4 chronic kidney disease, or unspecified chronic kidney disease: Secondary | ICD-10-CM | POA: Diagnosis present

## 2017-03-30 DIAGNOSIS — I480 Paroxysmal atrial fibrillation: Secondary | ICD-10-CM | POA: Diagnosis not present

## 2017-03-30 DIAGNOSIS — J449 Chronic obstructive pulmonary disease, unspecified: Secondary | ICD-10-CM | POA: Diagnosis not present

## 2017-03-30 DIAGNOSIS — R1310 Dysphagia, unspecified: Secondary | ICD-10-CM | POA: Diagnosis present

## 2017-03-30 DIAGNOSIS — I10 Essential (primary) hypertension: Secondary | ICD-10-CM | POA: Diagnosis not present

## 2017-03-30 DIAGNOSIS — R269 Unspecified abnormalities of gait and mobility: Secondary | ICD-10-CM | POA: Diagnosis not present

## 2017-03-30 DIAGNOSIS — I69398 Other sequelae of cerebral infarction: Secondary | ICD-10-CM | POA: Diagnosis not present

## 2017-03-30 DIAGNOSIS — F329 Major depressive disorder, single episode, unspecified: Secondary | ICD-10-CM | POA: Diagnosis present

## 2017-03-30 DIAGNOSIS — E860 Dehydration: Secondary | ICD-10-CM | POA: Diagnosis present

## 2017-03-30 DIAGNOSIS — E1122 Type 2 diabetes mellitus with diabetic chronic kidney disease: Secondary | ICD-10-CM | POA: Diagnosis present

## 2017-03-30 DIAGNOSIS — Z825 Family history of asthma and other chronic lower respiratory diseases: Secondary | ICD-10-CM | POA: Diagnosis not present

## 2017-03-30 LAB — COMPREHENSIVE METABOLIC PANEL
ALT: 10 U/L — ABNORMAL LOW (ref 14–54)
AST: 16 U/L (ref 15–41)
Albumin: 3.4 g/dL — ABNORMAL LOW (ref 3.5–5.0)
Alkaline Phosphatase: 69 U/L (ref 38–126)
Anion gap: 9 (ref 5–15)
BUN: 19 mg/dL (ref 6–20)
CO2: 25 mmol/L (ref 22–32)
Calcium: 9 mg/dL (ref 8.9–10.3)
Chloride: 101 mmol/L (ref 101–111)
Creatinine, Ser: 0.84 mg/dL (ref 0.44–1.00)
GFR calc Af Amer: >60 mL/min (ref >60)
GFR calc non Af Amer: >60 mL/min (ref >60)
Glucose, Bld: 279 mg/dL — ABNORMAL HIGH (ref 65–99)
Potassium: 3.7 mmol/L (ref 3.5–5.1)
Sodium: 135 mmol/L (ref 135–145)
Total Bilirubin: 0.7 mg/dL (ref 0.3–1.2)
Total Protein: 6.6 g/dL (ref 6.5–8.1)

## 2017-03-30 LAB — ECHOCARDIOGRAM COMPLETE
Ao-asc: 31 cm
E decel time: 299 msec
E/e' ratio: 15.5
FS: 35 % (ref 28–44)
Height: 61 in
IVS/LV PW RATIO, ED: 0.81
LA ID, A-P, ES: 42 mm
LA diam end sys: 42 mm
LA diam index: 2.48 cm/m2
LA vol A4C: 49.5 ml
LA vol index: 27.7 mL/m2
LA vol: 46.9 mL
LV E/e' medial: 15.5
LV E/e'average: 15.5
LV PW d: 14.5 mm — AB (ref 0.6–1.1)
LV e' LATERAL: 5.51 cm/s
LVOT area: 3.46 cm2
LVOT diameter: 21 mm
Lateral S' vel: 14.1 cm/s
MV Dec: 299
MV Peak grad: 3 mmHg
MV pk A vel: 139 m/s
MV pk E vel: 85.4 m/s
TAPSE: 20 mm
TDI e' lateral: 5.51
TDI e' medial: 3.32
Weight: 2303.37 oz

## 2017-03-30 LAB — GLUCOSE, CAPILLARY
Glucose-Capillary: 170 mg/dL — ABNORMAL HIGH (ref 65–99)
Glucose-Capillary: 176 mg/dL — ABNORMAL HIGH (ref 65–99)
Glucose-Capillary: 190 mg/dL — ABNORMAL HIGH (ref 65–99)
Glucose-Capillary: 213 mg/dL — ABNORMAL HIGH (ref 65–99)
Glucose-Capillary: 228 mg/dL — ABNORMAL HIGH (ref 65–99)
Glucose-Capillary: 286 mg/dL — ABNORMAL HIGH (ref 65–99)

## 2017-03-30 LAB — URINALYSIS, ROUTINE W REFLEX MICROSCOPIC
Bilirubin Urine: NEGATIVE
Glucose, UA: 150 mg/dL — AB
Ketones, ur: NEGATIVE mg/dL
Leukocytes, UA: NEGATIVE
Nitrite: NEGATIVE
Protein, ur: NEGATIVE mg/dL
Specific Gravity, Urine: 1.01 (ref 1.005–1.030)
pH: 5 (ref 5.0–8.0)

## 2017-03-30 LAB — RAPID URINE DRUG SCREEN, HOSP PERFORMED
Amphetamines: NOT DETECTED
Barbiturates: NOT DETECTED
Benzodiazepines: NOT DETECTED
Cocaine: NOT DETECTED
Opiates: NOT DETECTED
Tetrahydrocannabinol: NOT DETECTED

## 2017-03-30 LAB — TROPONIN I
Troponin I: <0.03 ng/mL (ref <0.03)
Troponin I: <0.03 ng/mL (ref <0.03)
Troponin I: <0.03 ng/mL (ref <0.03)

## 2017-03-30 MED ORDER — CARVEDILOL 6.25 MG PO TABS
6.2500 mg | ORAL_TABLET | Freq: Two times a day (BID) | ORAL | Status: DC
  Administered 2017-03-30 – 2017-04-03 (×10): 6.25 mg via ORAL
  Filled 2017-03-30 (×10): qty 1

## 2017-03-30 MED ORDER — SODIUM CHLORIDE 0.9 % IV BOLUS (SEPSIS)
1000.0000 mL | Freq: Once | INTRAVENOUS | Status: AC
  Administered 2017-03-30: 1000 mL via INTRAVENOUS

## 2017-03-30 MED ORDER — ASPIRIN 300 MG RE SUPP: 300.0000 mg | Freq: Every day | RECTAL | Status: DC

## 2017-03-30 MED ORDER — SODIUM CHLORIDE 0.9 % IV SOLN: INTRAVENOUS | Status: DC

## 2017-03-30 MED ORDER — PANTOPRAZOLE SODIUM 40 MG PO TBEC
40.0000 mg | DELAYED_RELEASE_TABLET | Freq: Two times a day (BID) | ORAL | Status: DC
  Administered 2017-03-30 – 2017-04-03 (×9): 40 mg via ORAL
  Filled 2017-03-30 (×10): qty 1

## 2017-03-30 MED ORDER — DICLOFENAC SODIUM 1 % TD GEL
2.0000 g | Freq: Four times a day (QID) | TRANSDERMAL | Status: DC
  Administered 2017-03-30 – 2017-04-03 (×16): 2 g via TOPICAL
  Filled 2017-03-30: qty 100

## 2017-03-30 MED ORDER — APIXABAN 5 MG PO TABS
5.0000 mg | ORAL_TABLET | Freq: Two times a day (BID) | ORAL | Status: DC
  Administered 2017-03-30 – 2017-04-03 (×9): 5 mg via ORAL
  Filled 2017-03-30 (×9): qty 1

## 2017-03-30 MED ORDER — IRBESARTAN 150 MG PO TABS
150.0000 mg | ORAL_TABLET | Freq: Every day | ORAL | Status: DC
  Administered 2017-03-30 – 2017-04-03 (×5): 150 mg via ORAL
  Filled 2017-03-30 (×5): qty 1

## 2017-03-30 MED ORDER — APIXABAN 5 MG PO TABS
5.0000 mg | ORAL_TABLET | Freq: Two times a day (BID) | ORAL | Status: DC
  Filled 2017-03-30: qty 1

## 2017-03-30 MED ORDER — IPRATROPIUM-ALBUTEROL 0.5-2.5 (3) MG/3ML IN SOLN: 3.0000 mL | Freq: Four times a day (QID) | RESPIRATORY_TRACT | Status: DC | PRN

## 2017-03-30 MED ORDER — ONDANSETRON HCL 4 MG PO TABS: 4.0000 mg | ORAL_TABLET | Freq: Three times a day (TID) | ORAL | Status: DC | PRN

## 2017-03-30 MED ORDER — METFORMIN HCL 500 MG PO TABS
500.0000 mg | ORAL_TABLET | Freq: Two times a day (BID) | ORAL | Status: DC
  Administered 2017-03-30: 500 mg via ORAL
  Filled 2017-03-30: qty 1

## 2017-03-30 MED ORDER — ASPIRIN EC 81 MG PO TBEC
81.0000 mg | DELAYED_RELEASE_TABLET | Freq: Every day | ORAL | Status: DC
  Administered 2017-03-30 – 2017-04-03 (×5): 81 mg via ORAL
  Filled 2017-03-30 (×5): qty 1

## 2017-03-30 MED ORDER — STROKE: EARLY STAGES OF RECOVERY BOOK
Freq: Once | Status: AC
  Administered 2017-03-30: 05:00:00

## 2017-03-30 MED ORDER — DULOXETINE HCL 30 MG PO CPEP
30.0000 mg | ORAL_CAPSULE | Freq: Two times a day (BID) | ORAL | Status: DC
  Administered 2017-03-30 – 2017-04-03 (×9): 30 mg via ORAL
  Filled 2017-03-30 (×9): qty 1

## 2017-03-30 MED ORDER — LEVOTHYROXINE SODIUM 50 MCG PO TABS
50.0000 ug | ORAL_TABLET | Freq: Every day | ORAL | Status: DC
  Administered 2017-03-30 – 2017-04-03 (×5): 50 ug via ORAL
  Filled 2017-03-30 (×6): qty 1

## 2017-03-30 MED ORDER — INSULIN ASPART 100 UNIT/ML ~~LOC~~ SOLN
0.0000 [IU] | SUBCUTANEOUS | Status: DC
  Administered 2017-03-30: 3 [IU] via SUBCUTANEOUS
  Administered 2017-03-30: 2 [IU] via SUBCUTANEOUS
  Administered 2017-03-30: 5 [IU] via SUBCUTANEOUS
  Administered 2017-03-30: 3 [IU] via SUBCUTANEOUS
  Administered 2017-03-30: 2 [IU] via SUBCUTANEOUS
  Administered 2017-03-31 (×2): 7 [IU] via SUBCUTANEOUS
  Administered 2017-03-31: 2 [IU] via SUBCUTANEOUS
  Administered 2017-03-31: 3 [IU] via SUBCUTANEOUS
  Administered 2017-03-31 – 2017-04-01 (×3): 2 [IU] via SUBCUTANEOUS
  Administered 2017-04-01: 3 [IU] via SUBCUTANEOUS

## 2017-03-30 MED ORDER — PREGABALIN 75 MG PO CAPS
150.0000 mg | ORAL_CAPSULE | Freq: Every day | ORAL | Status: DC
  Administered 2017-03-30 – 2017-04-03 (×5): 150 mg via ORAL
  Filled 2017-03-30 (×5): qty 2

## 2017-03-30 MED ORDER — MECLIZINE HCL 12.5 MG PO TABS: 25.0000 mg | ORAL_TABLET | Freq: Three times a day (TID) | ORAL | Status: DC | PRN

## 2017-03-30 MED ORDER — ACETAMINOPHEN 325 MG PO TABS
650.0000 mg | ORAL_TABLET | ORAL | Status: DC | PRN
  Administered 2017-03-30 – 2017-04-01 (×2): 650 mg via ORAL
  Filled 2017-03-30 (×2): qty 2

## 2017-03-30 MED ORDER — ASPIRIN 325 MG PO TABS: 325.0000 mg | ORAL_TABLET | Freq: Every day | ORAL | Status: DC

## 2017-03-30 MED ORDER — ATORVASTATIN CALCIUM 40 MG PO TABS
40.0000 mg | ORAL_TABLET | Freq: Every day | ORAL | Status: DC
  Administered 2017-03-30 – 2017-04-03 (×5): 40 mg via ORAL
  Filled 2017-03-30 (×5): qty 1

## 2017-03-30 MED ORDER — ACETAMINOPHEN 650 MG RE SUPP: 650.0000 mg | RECTAL | Status: DC | PRN

## 2017-03-30 MED ORDER — ACETAMINOPHEN 160 MG/5ML PO SOLN: 650.0000 mg | ORAL | Status: DC | PRN

## 2017-03-30 NOTE — Progress Notes (Signed)
SLP Cancellation Note  Patient Details Name: [name] MRN: [id number] DOB: 1934/05/13   Cancelled treatment:       Reason Eval/Treat Not Completed: Patient at procedure or test/unavailable   Dekisha Mesmer, Riley NearingBonnie Caroline 03/30/2017, 9:26 AM

## 2017-03-30 NOTE — Progress Notes (Signed)
STROKE TEAM PROGRESS NOTE   SUBJECTIVE (INTERVAL HISTORY) Her husband and daughter are at the bedside.  As per daughter, patient at baseline able to walk with walker, still weak on the right upper and lower extremity, however able to taking care of herself.  This admission her right-sided weakness dramatically worse with right facial droop and slurred speech.  As per husband, patient recently eating a lot of sweet, blood sugar level high at home.  Patient and husband stated that patient compliant with medication at home.  However, she is not on aspirin but only on Eliquis.   OBJECTIVE Temp:  [98 F (36.7 C)-98.2 F (36.8 C)] 98.1 F (36.7 C) (11/23 0800) Pulse Rate:  [76-83] 76 (11/23 0800) Cardiac Rhythm: Normal sinus rhythm (11/23 0707) Resp:  [16-23] 16 (11/23 0800) BP: (117-168)/(53-88) 150/60 (11/23 0800) SpO2:  [95 %-100 %] 100 % (11/23 0800) Weight:  [143 lb 15.4 oz (65.3 kg)] 143 lb 15.4 oz (65.3 kg) (11/23 0400)  CBC:  Recent Labs  Lab 03/29/17 2224 03/29/17 2243  WBC 14.7*  --   NEUTROABS 9.7*  --   HGB 11.6* 13.6  HCT 35.5* 40.0  MCV 84.9  --   PLT 258  --     Basic Metabolic Panel:  Recent Labs  Lab 03/29/17 2224 03/29/17 2243  NA 135 136  K 4.1 4.1  CL 101 102  CO2 22  --   GLUCOSE 269* 259*  BUN 28* 33*  CREATININE 1.18* 1.00  CALCIUM 9.1  --     Lipid Panel:     Component Value Date/Time   CHOL 99 02/10/2016 0823   TRIG 149 02/10/2016 0823   HDL 21 (L) 02/10/2016 0823   CHOLHDL 4.7 02/10/2016 0823   VLDL 30 02/10/2016 0823   LDLCALC 48 02/10/2016 0823   HgbA1c:  Lab Results  Component Value Date   HGBA1C 7.9 (H) 06/28/2016   Urine Drug Screen:     Component Value Date/Time   LABOPIA NONE DETECTED 03/29/2017 2222   COCAINSCRNUR NONE DETECTED 03/29/2017 2222   LABBENZ NONE DETECTED 03/29/2017 2222   AMPHETMU NONE DETECTED 03/29/2017 2222   THCU NONE DETECTED 03/29/2017 2222   LABBARB NONE DETECTED 03/29/2017 2222    Alcohol Level      Component Value Date/Time   ETH <10 03/29/2017 2224    IMAGING I have personally reviewed the radiological images below and agree with the radiology interpretations.  Ct Head Wo Contrast 03/29/2017 IMPRESSION:  No acute intracranial abnormalities. Chronic atrophy and small vessel ischemic changes. Multifocal old infarcts. No change since prior study.   Mr Maxine GlennMra Neck Wo Contrast 03/30/2017 IMPRESSION:  1. Small acute infarct of the left pons measuring approximately 8 x 3 mm. No associated hemorrhage or mass effect. This is in keeping with reported right-sided weakness.  2. Multiple old cortical and gangliothalamic infarcts superimposed on advanced chronic ischemic microangiopathy.  3. Motion degraded MRA of the head. There is moderate to severe stenosis of the supraclinoid right ICA. Otherwise, no occlusion or high-grade stenosis of the intracranial arch vessels.  4. Motion degraded MRA of the neck. No hemodynamically significant internal carotid artery stenosis. Areas of loss of flow related enhancement of the left vertebral artery V2 segment may be artifactual. MRA of the neck with contrast or CTA of the neck might be helpful for further evaluation.   Echocardiogram - Left ventricle: The cavity size was normal. Wall thickness was   increased in a pattern of mild LVH. Systolic  function was normal.   The estimated ejection fraction was in the range of 55% to 60%.   Wall motion was normal; there were no regional wall motion   abnormalities. Doppler parameters are consistent with both   elevated ventricular end-diastolic filling pressure and elevated   left atrial filling pressure. - Aortic valve: There was trivial regurgitation. - Left atrium: The atrium was mildly dilated. - Atrial septum: There was increased thickness of the septum,   consistent with lipomatous hypertrophy. No defect or patent   foramen ovale was identified.   PHYSICAL EXAM Vitals:   03/30/17 0030 03/30/17 0400  03/30/17 0600 03/30/17 0800  BP:  (!) 122/58 (!) 168/88 (!) 150/60  Pulse:  78  76  Resp:  18 18 16   Temp: 98.2 F (36.8 C) 98 F (36.7 C) 98 F (36.7 C) 98.1 F (36.7 C)  TempSrc:  Oral Oral Oral  SpO2:  96% 95% 100%  Weight:  143 lb 15.4 oz (65.3 kg)    Height:  5\' 1"  (1.549 m)      Temp:  [98 F (36.7 C)-98.2 F (36.8 C)] 98.1 F (36.7 C) (11/23 0800) Pulse Rate:  [76-83] 76 (11/23 0800) Resp:  [16-23] 16 (11/23 0800) BP: (117-168)/(53-88) 150/60 (11/23 0800) SpO2:  [95 %-100 %] 100 % (11/23 0800) Weight:  [143 lb 15.4 oz (65.3 kg)] 143 lb 15.4 oz (65.3 kg) (11/23 0400)  General - Well nourished, well developed, in no apparent distress.  Ophthalmologic - Fundi not visualized due to noncooperation.  Cardiovascular - Regular rate and rhythm, not in A. fib.  Mental Status -  Level of arousal and orientation to time, place, and person were intact. Language including expression, naming, repetition, comprehension was assessed and found intact. Mild psychomotor slowing.  Cranial Nerves II - XII - II - Visual field intact OU. III, IV, VI - Extraocular movements intact. V - Facial sensation intact bilaterally. VII - mild right facial droop. VIII - Hearing & vestibular intact bilaterally. X - Palate elevates symmetrically. XI - Chin turning & shoulder shrug intact bilaterally. XII - Tongue protrusion intact.  Motor Strength - The patient's strength was normal in LUE and LLE, however, RUE 2/5 proximal and 3/5 distal, RLE 0/5 proximal and 2/5 distal.  Bulk was normal and fasciculations were absent.   Motor Tone - Muscle tone was assessed at the neck and appendages and was normal.  Reflexes - The patient's reflexes were 1+ in all extremities and she had no pathological reflexes.  Sensory - Light touch, temperature/pinprick were assessed and were symmetrical except right lower extremity 50% sensation decreased comparing with left.    Coordination - The patient had normal  movements in the left hand with no ataxia or dysmetria.  Tremor was absent.  Gait and Station - not able to test due to weakness   ASSESSMENT/PLAN [name] is a 82 y.o. female with history of previous strokes, PVD, paroxysmal atrial fibrillation on Eliquis, hypertension, diabetes, COPD, and BPPV presenting with worsening right-sided weakness and dysarthria. She did not receive IV t-PA due to on eliquis.  Stroke:  Small acute infarct in the left pons - likely small vessel disease in the setting of uncontrolled risk factors.  Resultant right facial droop, worsening right-sided hemiparesis  CT head - No acute intracranial abnormalities.  MRI head - Small acute infarct of the left pons, multiple remote infarcts  MRA head -multifocal intracranial stenosis including high-grade left and tandem stenosis of bilateral PCAs  2D Echo - EF 55-60%  LDL - pending  HgbA1c - pending  VTE prophylaxis - Eliquis Diet heart healthy/carb modified Room service appropriate? Yes; Fluid consistency: Thin  Eliquis (apixaban) daily prior to admission, now on aspirin 81 mg daily and Eliquis (apixaban) daily. Continue aspirin 81 and Eliquis for stroke prevention on discharge  Patient counseled to be compliant with her antithrombotic medications  Ongoing aggressive stroke risk factor management  Therapy recommendations: pending  Disposition: Pending  Diabetes with hyperglycemia  HgbA1c Pending, goal < 7.0  Uncontrolled  Hyperglycemia during admission and PTA  Patient not compliant with diabetic diet  Levemir at home  SSI and CBG monitoring  DME the patient" PCP follow-up  Paroxysmal A. Fib  Diagnosed with 30-day cardiac event monitoring  On Eliquis  Compliant with medication as per patient  Continue Eliquis on discharge  History of stroke  03/2015 -left MCA infarct on MRI, MRI showed multifocal intracranial stenosis including left M1, left V4, right MCA, left P2.  Carotid  Doppler and 2D echo negative, LDL 76 and A1c 9.0 EEG negative.  Had orthostatic hypotension episode, with extension left MCA infarct and right hemiparesis, discharged with dual antiplatelet and Lipitor  Subsequent to the day cardiac event monitor found A. 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 Please call with questions. Pt will follow up with [name] at [facility] in about 6 weeks. Thanks for the consult.  Marvel Plan, MD PhD Stroke Neurology 03/30/2017 12:01 PM    To contact Stroke Continuity provider, please refer to [email or web address]. After hours, contact General Neurology

## 2017-03-30 NOTE — Progress Notes (Signed)
ANTICOAGULATION CONSULT NOTE - Initial Consult  Pharmacy Consult for Continuation of Apixaban Indication: atrial fibrillation, ?stroke  Allergies  Allergen Reactions  . Doxycycline Hyclate Nausea And Vomiting  . Morphine Sulfate Other (See Comments)    REACTION: hallucinations  . Iohexol Hives     Code: HIVES, Desc: pt. states she breaks out in hives 06/15/08   . Moxifloxacin Other (See Comments)    REACTION: unknown    Vital Signs: Temp: 98.2 F (36.8 C) (11/23 0030) BP: 130/58 (11/22 2315) Pulse Rate: 82 (11/22 2315)  Labs: Recent Labs    03/29/17 2224 03/29/17 2243  HGB 11.6* 13.6  HCT 35.5* 40.0  PLT 258  --   APTT 32  --   LABPROT 14.3  --   INR 1.12  --   CREATININE 1.18* 1.00    CrCl cannot be calculated (Unknown ideal weight.).   Medical History: Past Medical History:  Diagnosis Date  . ASTHMA 07/15/2008  . COPD (chronic obstructive pulmonary disease) (HCC)   . DIABETES-TYPE 2 07/15/2008  . DIABETIC PERIPHERAL NEUROPATHY 09/02/2009  . GERD 07/15/2008  . HYPERTENSION 07/15/2008  . Normal cardiac stress test    low risk Nuc 2009, Feb 2011  . Paroxysmal atrial fibrillation (HCC)   . PVD (peripheral vascular disease) (HCC) 06/18/09   RCE  . Stroke (HCC)   . Vertigo     Assessment: 82 y/o F presents with right sided weakness. On apixaban PTA for afib. CT head with no acute changes. Continuing PTA apixaban. CBC good.   Goal of Therapy:  Monitor platelets by anticoagulation protocol: Yes   Plan:  Apixaban 5mg  BID Daily CBC Monitor for bleeding  Abran DukeLedford, Lyndel Dancel 03/30/2017,1:21 AM

## 2017-03-30 NOTE — ED Notes (Signed)
Patient is stable and ready to be transport to the floor at this time.  Report was called to 3W RN.  Belongings taken with the patient to the floor.   

## 2017-03-30 NOTE — Progress Notes (Signed)
  Echocardiogram 2D Echocardiogram has been performed.  Casey Harrell G Casey Harrell 03/30/2017, 10:07 AM

## 2017-03-30 NOTE — ED Notes (Signed)
Patient transported to X-ray 

## 2017-03-30 NOTE — ED Notes (Signed)
Inpatient Provider at bedside. 

## 2017-03-30 NOTE — Evaluation (Signed)
Physical Therapy Evaluation Patient Details Name: Casey Harrell MRN: [id number] DOB: 1935/02/25 Today's Date: 03/30/2017   History of Present Illness  82 y.o. female white female with past medical history of CVA with residual right hemiparesis, diabetes, hypertension, COPD, atrial fibrillation on Eliquis, peripheral vascular disease presents with right-sided weakness and her speech since 8 AM this morning.Was admitted to [facility] 03/31/2015 for agitation and increased confusion.  MRI showed small patchy left MCA infarcts with remote bilateral occipital infarcts, embolic pattern.  Workup during that admission revealed the patient also had intracranial atherosclerotic disease and was placed on dual antiplatelets.   Clinical Impression  Pt admitted with above diagnosis. PTA pt ambulating with RW and independent with ADLs and iADLs. Pt with functional limitations due to the deficits listed below (see PT Problem List). Pt is currently limited by R-sided pain in her shoulder, elbow, knee and ankle and experiences dizziness with coming to upright as a result pt is currently maxA for bed mobility and is unable to attempt transfers and ambulation. PT will conduct vestibular assessment to r/o BPPV as cause of dizziness. Pt will benefit from skilled PT to increase their independence and safety with mobility to allow discharge to the venue listed below.       Follow Up Recommendations SNF    Equipment Recommendations  Other (comment)(to be determined at next vanue)    Recommendations for Other Services OT consult     Precautions / Restrictions Precautions Precautions: Fall Restrictions Weight Bearing Restrictions: No      Mobility  Bed Mobility Overal bed mobility: Needs Assistance Bed Mobility: Supine to Sit;Sit to Supine Rolling: Min assist   Supine to sit: Mod assist;Max assist Sit to supine: Max assist   General bed mobility comments: modA to come over onto rightside with vc to  reach with L UE to bedrail to assist, maxA for LE management off of bed and trunk to upright, increased pain in R side with movement, maxA for LE management back into bed  Transfers                 General transfer comment: unable to attempt due to increased R sided pain and dizziness in sitting     Modified Rankin (Stroke Patients Only) Modified Rankin (Stroke Patients Only) Pre-Morbid Rankin Score: No symptoms Modified Rankin: Severe disability     Balance Overall balance assessment: Needs assistance Sitting-balance support: Feet supported;Single extremity supported Sitting balance-Leahy Scale: Poor Sitting balance - Comments: pt requires min-modA to maintain balance EoB, pt c/o dizziness with coming to upright Postural control: Right lateral lean                                   Pertinent Vitals/Pain Pain Assessment: 0-10 Pain Score: 7  Pain Location: L shoulder, elbow, knee and ankle  Pain Descriptors / Indicators: Grimacing;Guarding Pain Intervention(s): Limited activity within patient's tolerance;Monitored during session    Home Living Family/patient expects to be discharged to:: Private residence Living Arrangements: Spouse/significant other Available Help at Discharge: Family;Available 24 hours/day(husband with cancer home, 2 children who work) Type of Home: House Home Access: Stairs to enter Entrance Stairs-Rails: Can reach both Entrance Stairs-Number of Steps: 6 Home Layout: One level Home Equipment: Walker - 4 wheels;Bedside commode;Shower seat - built in;Grab bars - tub/shower;Hand held shower head      Prior Function Level of Independence: Independent with assistive device(s)  Comments: ambulated with RW, no assist with ADLs, iADLs      Hand Dominance   Dominant Hand: Right    Extremity/Trunk Assessment   Upper Extremity Assessment Upper Extremity Assessment: RUE deficits/detail RUE Deficits / Details: shoulder  flexion and elbow ROM limited by pain, pt states they have been painful since pnemonia shot, wrist and finger ROM WFL, grip strength grossly 3-/5 RUE: Unable to fully assess due to pain RUE Coordination: decreased fine motor;decreased gross motor    Lower Extremity Assessment Lower Extremity Assessment: RLE deficits/detail RLE Deficits / Details: R knee and ankle ROM limited by pain s/p fall, difficult to assess hip ROM and strength due to increased knee pain with movement,   RLE: Unable to fully assess due to pain RLE Sensation: decreased light touch;decreased proprioception RLE Coordination: decreased fine motor;decreased gross motor       Communication   Communication: No difficulties  Cognition Arousal/Alertness: Awake/alert Behavior During Therapy: WFL for tasks assessed/performed Overall Cognitive Status: Within Functional Limits for tasks assessed                                        General Comments General comments (skin integrity, edema, etc.): Pt c/o of dizziness with coming to upright, vitals recorded after coming back to supine BP 166/70, HR 71 bpm and SaO2 on RA 96%O2, pt vision reassess and although no nystagmus noted pt reported increased dizziness with finger following        Assessment/Plan    PT Assessment Patient needs continued PT services  PT Problem List Decreased strength;Decreased range of motion;Decreased activity tolerance;Decreased balance;Decreased mobility;Decreased coordination;Impaired sensation;Pain       PT Treatment Interventions DME instruction;Gait training;Functional mobility training;Therapeutic activities;Stair training;Therapeutic exercise;Balance training;Neuromuscular re-education;Cognitive remediation;Patient/family education    PT Goals (Current goals can be found in the Care Plan section)  Acute Rehab PT Goals Patient Stated Goal: have less pain PT Goal Formulation: With patient Time For Goal Achievement:  04/13/17 Potential to Achieve Goals: Fair    Frequency Min 3X/week    AM-PAC PT "6 Clicks" Daily Activity  Outcome Measure Difficulty turning over in bed (including adjusting bedclothes, sheets and blankets)?: Unable Difficulty moving from lying on back to sitting on the side of the bed? : Unable Difficulty sitting down on and standing up from a chair with arms (e.g., wheelchair, bedside commode, etc,.)?: Unable Help needed moving to and from a bed to chair (including a wheelchair)?: Total Help needed walking in hospital room?: Total Help needed climbing 3-5 steps with a railing? : Total 6 Click Score: 6    End of Session   Activity Tolerance: Patient limited by pain;Other (comment)(dizziness) Patient left: in bed;with call bell/phone within reach;with bed alarm set Nurse Communication: Mobility status PT Visit Diagnosis: Other abnormalities of gait and mobility (R26.89);History of falling (Z91.81);Muscle weakness (generalized) (M62.81);Difficulty in walking, not elsewhere classified (R26.2);Other symptoms and signs involving the nervous system (R29.898);Dizziness and giddiness (R42);Hemiplegia and hemiparesis;Pain Hemiplegia - Right/Left: Right Hemiplegia - dominant/non-dominant: Dominant Hemiplegia - caused by: Cerebral infarction Pain - Right/Left: Right Pain - part of body: Shoulder;Arm;Knee;Ankle and joints of foot    Time: 1610-9604 PT Time Calculation (min) (ACUTE ONLY): 47 min   Charges:   PT Evaluation $PT Eval Moderate Complexity: 1 Mod PT Treatments $Therapeutic Activity: 23-37 mins   PT G Codes:        Anaaya Fuster B. Avaya PT,  DPT Acute Rehabilitation  [phone] Pager [phone]    Elon Alas Fleet 03/30/2017, 5:15 PM

## 2017-03-30 NOTE — Progress Notes (Signed)
PROGRESS NOTE    Casey Harrell  NWG:[id number] DOB: [birth year] DOA: 03/29/2017 PCP: Kristian CoveyBurchette, Bruce W, MD    Brief Narrative:  82 year old female who presents with weakness. Patient does have the significant past medical history for hypertension, dyslipidemia, type 2 diabetes mellitus, and paroxysmal atrial fibrillation with a prior CVA. She presents with 24-hour history of weakness in her right lower extremity, associated with difficulty speaking, and dysphagia. On the initial physical examination blood pressure 130/58, heart rate 82, temperature 98.2, respiratory 23, oxygen saturation 95%. Moist mucous membranes, lungs clear to auscultation bilaterally, heart S1-S2 present rhythmic, abdomen soft nontender, no lower extremity edema. Sodium 136, potassium 4.1, chloride 102, bicarbonate 22, glucose 259, BUN 33, creatinine 1.0, white count 14.7, hemoglobin 11.6, hematocrit 35.5, platelets 258, urinalysis negative for infection, urine drug screen negative. Head CT with no acute intracranial changes. Chronic atrophy and small vessel ischemic changes, multifocal old infarcts. Chest x-ray, hypoinflated, left rotation, no infiltrates.   Patient was admitted to the hospital with the working diagnosis of focal neurologic deficit to rule out CVA.   Assessment & Plan:   Principal Problem:   Stroke [facility](HCC) Active Problems:   Hypothyroidism   Type 2 diabetes mellitus, uncontrolled (HCC)   Paroxysmal atrial fibrillation (HCC)   Anemia   History of stroke   1. Acute ischemic CVA. Patient with significant right sided hemiparesis, will continue neuro checks and physical therapy evaluation, will continue antiplatelet therapy with aspirin and anticoagulation with apixaban. Further work up with MRI showed small acute infarct, of the left pons, 8x3 mm. Old chronic gangliothalamic infarcts, moderate to severe stenosis of the right supraclinoid ICA. Echocardiography with left ventricle ejection fraction 55-60%. Will  follow with physical therapy evaluation. Right ankle pain may limit therapy.   2. Paroxysmal atrial fibrillation. Rate control with carvedilol, will continue anticoagulation with apixaban and will add aspirin due to recurrent cva. Will continue telemetry monitoting.   4. HTN. Continue blood pressure control with irbesartan and carvedilol.   3. Type 2 diabetes mellitus. Patient tolerating po well, will continue glucose cover and monitoring with insulin sliding scale. Capillary glucose, 248, 168, 213, 190, 286. Will continue to calculate insulin requirements before starting basal insulin. Hold on metformin for now. Continue pregabalin.   4. Hypothyroidism. Will continue levothyroxine.   5. Anemia. Likely multifactorial, will continue cell count monitoring.   6. Depression. Continue duloxetine.   7. Right ankle edema. No fracture per x ray. Will continue pain control with topical diclofenac.    DVT prophylaxis: apixaban  Code Status: full Family Communication: I spoke with patient's daughter at the bedside and all questions were addressed.  Disposition Plan: rehab.    Consultants:   Neurology   Procedures:     Antimicrobials:       Subjective: Patient with persistent right hemiparesis. Positive right ankle pain, worse to touch and movement, decrease range of motion due to edema and pain. No chest pain or dyspnea.   Objective: Vitals:   03/30/17 0400 03/30/17 0600 03/30/17 0800 03/30/17 1000  BP: (!) 122/58 (!) 168/88 (!) 150/60 (!) 145/62  Pulse: 78  76 71  Resp: 18 18 16 12   Temp: 98 F (36.7 C) 98 F (36.7 C) 98.1 F (36.7 C)   TempSrc: Oral Oral Oral   SpO2: 96% 95% 100% 95%  Weight: 65.3 kg (143 lb 15.4 oz)     Height: 5\' 1"  (1.549 m)       Intake/Output Summary (Last 24 hours) at 03/30/2017  1304 Last data filed at 03/30/2017 1100 Gross per 24 hour  Intake 120 ml  Output -  Net 120 ml   Filed Weights   03/30/17 0400  Weight: 65.3 kg (143 lb 15.4 oz)     Examination:   General: Not in pain or dyspnea, deconditioned.  Neurology: Awake and alert, right hemiparesis.  E ENT: no pallor, no icterus, oral mucosa moist Cardiovascular: No JVD. S1-S2 present, rhythmic, no gallops, rubs, or murmurs. No lower extremity edema. Pulmonary: vesicular breath sounds bilaterally, adequate air movement, no wheezing, rhonchi or rales. Gastrointestinal. Abdomen flat, no organomegaly, non tender, no rebound or guarding Skin. No rashes Musculoskeletal: right ankle edema, tender to palpation, no erythema or increased local temperature, decreased range of motion due to pain.      Data Reviewed: I have personally reviewed following labs and imaging studies  CBC: Recent Labs  Lab 03/29/17 2224 03/29/17 2243  WBC 14.7*  --   NEUTROABS 9.7*  --   HGB 11.6* 13.6  HCT 35.5* 40.0  MCV 84.9  --   PLT 258  --    Basic Metabolic Panel: Recent Labs  Lab 03/29/17 2224 03/29/17 2243 03/30/17 1001  NA 135 136 135  K 4.1 4.1 3.7  CL 101 102 101  CO2 22  --  25  GLUCOSE 269* 259* 279*  BUN 28* 33* 19  CREATININE 1.18* 1.00 0.84  CALCIUM 9.1  --  9.0   GFR: Estimated Creatinine Clearance: 47 mL/min (by C-G formula based on SCr of 0.84 mg/dL). Liver Function Tests: Recent Labs  Lab 03/29/17 2224 03/30/17 1001  AST 22 16  ALT 12* 10*  ALKPHOS 66 69  BILITOT 0.7 0.7  PROT 6.9 6.6  ALBUMIN 3.7 3.4*   No results for input(s): LIPASE, AMYLASE in the last 168 hours. No results for input(s): AMMONIA in the last 168 hours. Coagulation Profile: Recent Labs  Lab 03/29/17 2224  INR 1.12   Cardiac Enzymes: Recent Labs  Lab 03/30/17 0419 03/30/17 1001  TROPONINI <0.03 <0.03   BNP (last 3 results) No results for input(s): PROBNP in the last 8760 hours. HbA1C: No results for input(s): HGBA1C in the last 72 hours. CBG: Recent Labs  Lab 03/30/17 0507 03/30/17 0748 03/30/17 1120  GLUCAP 213* 190* 286*   Lipid Profile: No results for  input(s): CHOL, HDL, LDLCALC, TRIG, CHOLHDL, LDLDIRECT in the last 72 hours. Thyroid Function Tests: No results for input(s): TSH, T4TOTAL, FREET4, T3FREE, THYROIDAB in the last 72 hours. Anemia Panel: No results for input(s): VITAMINB12, FOLATE, FERRITIN, TIBC, IRON, RETICCTPCT in the last 72 hours.    Radiology Studies: I have reviewed all of the imaging during this hospital visit personally     Scheduled Meds: . apixaban  5 mg Oral BID  . aspirin EC  81 mg Oral Daily  . atorvastatin  40 mg Oral Daily  . carvedilol  6.25 mg Oral BID WC  . DULoxetine  30 mg Oral BID  . insulin aspart  0-9 Units Subcutaneous Q4H  . irbesartan  150 mg Oral Daily  . levothyroxine  50 mcg Oral QAC breakfast  . metFORMIN  500 mg Oral BID WC  . pantoprazole  40 mg Oral BID  . pregabalin  150 mg Oral Daily   Continuous Infusions: . sodium chloride       LOS: 0 days        Mauricio Annett Gulaaniel Arrien, MD [facility] Pager [phone]

## 2017-03-30 NOTE — Consult Note (Addendum)
Requesting Physician: Elpidio [facility]ari [facility]still [facility]     Chief Complaint: R side weakness  History obtained from: Patient and Chart   HPI:                                                                                                                                       Casey Harrell is an 82 y.o. female white female with past medical history of CVA with residual right hemiparesis, diabetes, hypertension, COPD, atrial fibrillation on Eliquis, peripheral vascular disease presents with right-sided weakness and her speech since 8 AM this morning.  Patient has baseline right hemiparesis following left MCA stroke in 2016, however walk at baseline. Today her husband states around 8 AM she noticed that her right side was weaker and no longer can walk. Her speech also became more slurred and finally the family decided to bring her to the emergency room. Her blood pressure here is 140 systolic. The patient states she is compliant with her medications    Stroke History [facility] for agitation and increased confusion.  MRI showed small patchy left MCA infarcts with remote bilateral occipital infarcts, embolic pattern.  Workup during that admission revealed the patient also had intracranial atherosclerotic disease and was placed on dual antiplatelets. However a 30 day cardiac monitoring while she had paroxysmal atrial fibrillation she has been switched Eliquis.      Date last known well: 11. 22.18 Time last known well:  8 am tPA Given: no, outside window NIHSS: 7 Baseline MRS 2   Past Medical History:  Diagnosis Date  . ASTHMA 07/15/2008  . COPD (chronic obstructive pulmonary disease) (HCC)   . DIABETES-TYPE 2 07/15/2008  . DIABETIC PERIPHERAL NEUROPATHY 09/02/2009  . GERD 07/15/2008  . HYPERTENSION 07/15/2008  . Normal cardiac stress test    low risk Nuc 2009, Feb 2011  . Paroxysmal atrial fibrillation (HCC)   . PVD (peripheral vascular disease) (HCC) 06/18/09   RCE  .  Stroke (HCC)   . Vertigo     Past Surgical History:  Procedure Laterality Date  . ABDOMINAL HYSTERECTOMY  1971  . APPENDECTOMY  1971  . CAROTID ENDARTERECTOMY  06/18/09   RCE  . CESAREAN SECTION    . colectomy and colostomy    . FLEXIBLE SIGMOIDOSCOPY     diverticulitis  . FOOT SURGERY     right foot X 2  . LAPAROTOMY     X 3 with adhesions lysis  . TONSILLECTOMY    . TUBAL LIGATION      Family History  Problem Relation Age of Onset  . Diabetes Mother   . Arthritis Mother   . Heart disease Mother   . Heart disease Father   . Heart disease Sister   . Heart disease Brother   . Cancer Brother 60       lung cancer  . Celiac disease Sister   .  Stroke Maternal Grandfather   . Cancer Paternal Grandfather        lung  . Heart disease Sister   . Asthma Sister    Social History:  reports that  has never smoked. she has never used smokeless tobacco. She reports that she does not drink alcohol or use drugs.  Allergies:  Allergies  Allergen Reactions  . Doxycycline Hyclate Nausea And Vomiting  . Morphine Sulfate Other (See Comments)    REACTION: hallucinations  . Iohexol Hives     Code: HIVES, Desc: pt. states she breaks out in hives 06/15/08   . Moxifloxacin Other (See Comments)    REACTION: unknown    Medications:                                                                                                                        I reviewed home medications. Occasionally misses her home Eliquis however states she has taken last few days.   ROS:                                                                                                                                     14 systems reviewed and negative except above.   Examination:                                                                                                      General: Appears well-developed and well-nourished.  Psych: Affect appropriate to situation Eyes: No scleral injection HENT: No OP  obstrucion Head: Normocephalic.  Cardiovascular: Normal rate and regular rhythm.  Respiratory: Effort normal and breath sounds normal to anterior ascultation GI: Soft.  No distension. There is no tenderness.  Skin: WDI   Neurological Examination Mental Status: Alert, oriented, thought content appropriate.  Speech fluent without evidence of aphasia. Dysarthric.  Able to follow 3 step commands without difficulty. Cranial Nerves: II Visual fields grossly normal,  III,IV, VI: ptosis not present, extra-ocular motions intact bilaterally, pupils equal, round, reactive  to light and accommodation V,VII: mild right facial droop VIII: hearing normal bilaterally IX,X: uvula rises symmetrically XI: bilateral shoulder shrug XII: midline tongue extension Motor: Right : Upper extremity   2/5    Left:     Upper extremity   5/5  Lower extremity   3/5     Lower extremity   5/5 Tone and bulk:normal tone throughout; no atrophy noted Sensory: Pinprick and light touch intact throughout, bilaterally Deep Tendon Reflexes: 2+ and symmetric throughout Plantars: Right: downgoing   Left: downgoing Cerebellar: normal finger-to-nose on left side  Gait: unable to assess   Lab Results: Basic Metabolic Panel: Recent Labs  Lab 03/29/17 09-30-22 03/29/17 2243  NA 135 136  K 4.1 4.1  CL 101 102  CO2 22  --   GLUCOSE 269* 259*  BUN 28* 33*  CREATININE 1.18* 1.00  CALCIUM 9.1  --     CBC: Recent Labs  Lab 03/29/17 09-30-2222 03/29/17 2243  WBC 14.7*  --   NEUTROABS 9.7*  --   HGB 11.6* 13.6  HCT 35.5* 40.0  MCV 84.9  --   PLT 258  --     Coagulation Studies: Recent Labs    03/29/17 09-30-22  LABPROT 14.3  INR 1.12    Imaging: Dg Chest 2 View  Result Date: 03/30/2017 CLINICAL DATA:  Weakness. EXAM: CHEST  2 VIEW COMPARISON:  Radiographs and CT 11/09/2016 FINDINGS: Unchanged heart size and mediastinal contours. No pulmonary edema, consolidation or pleural fluid. Mild chronic bronchial thickening,  stable. No pneumothorax. No acute osseous abnormality. IMPRESSION: No acute abnormality.  Chronic bronchial thickening. Electronically Signed   By: Rubye Oaks M.D.   On: 03/30/2017 00:58   Dg Ankle Complete Right  Result Date: 03/29/2017 CLINICAL DATA:  Right ankle pain after fall. EXAM: RIGHT ANKLE - COMPLETE 3+ VIEW COMPARISON:  None. FINDINGS: There is moderate soft tissue swelling over the lateral malleolus. A linear lucency is seen involving the metaphysis of the distal fibula on one view only and is believed to be secondary to overlap with the tibia. No significant joint effusion. The tibiotalar, subtalar and midfoot articulations are maintained. There are nonspecific subcutaneous soft tissue calcifications along the plantar aspect of the hindfoot possibly related prior injection for plantar fasciitis versus calcified fibromas/ plantar calcified fibromatosis. IMPRESSION: 1. Soft tissue swelling over the lateral malleolus. No acute fracture nor dislocations identified. 2. Coarse subcutaneous soft tissue calcifications along the plantar aspect of the hindfoot, nonspecific in etiology. Electronically Signed   By: Tollie Eth M.D.   On: 03/29/2017 23:26   Ct Head Wo Contrast  Result Date: 03/29/2017 CLINICAL DATA:  Focal neurological deficit for greater than 6 hours. Progressive weakness over the day. Losing control of fine motor skills. History of stroke with right-sided weakness. EXAM: CT HEAD WITHOUT CONTRAST TECHNIQUE: Contiguous axial images were obtained from the base of the skull through the vertex without intravenous contrast. COMPARISON:  MRI brain 06/20/2015.  CT head 06/20/2015 FINDINGS: Brain: Diffuse cerebral atrophy. Ventricular dilatation consistent with central atrophy. Low-attenuation changes throughout the deep white matter consistent small vessel ischemia. Multiple old lacunar infarcts seen bilaterally. Focal encephalomalacia in the occipital lobes bilaterally and in the left  parietal lobe consistent with old infarcts. No significant changes since previous study. No mass-effect or midline shift. No abnormal extra-axial fluid collections. Gray-white matter junctions are distinct. Basal cisterns are not effaced. No acute intracranial hemorrhage. Vascular: Intracranial arterial vascular calcifications are present. Skull: Calvarium appears intact. Sinuses/Orbits: Mucosal thickening  in the paranasal sinuses. No acute air-fluid levels. Mastoid air cells are clear. Other: None. IMPRESSION: No acute intracranial abnormalities. Chronic atrophy and small vessel ischemic changes. Multifocal old infarcts. No change since prior study. Electronically Signed   By: Burman Nieves M.D.   On: 03/29/2017 23:37   Dg Foot Complete Right  Result Date: 03/29/2017 CLINICAL DATA:  Right foot pain and weakness. EXAM: RIGHT FOOT COMPLETE - 3+ VIEW COMPARISON:  None. FINDINGS: Soft tissue calcifications are seen along the plantar aspect of the hindfoot could potentially represent calcified fibromatosis along the plantar fascia versus stigmata of prior subcutaneous injection for plantar fasciitis or dystrophic calcifications from prior surgical intervention. No acute fractures noted. Suggesting of small extra-articular erosion or cortical defect along the lateral aspect of the fifth metatarsal head -neck. No acute joint dislocation nor parrot fracture. IMPRESSION: 1. Negative for acute osseous abnormality. 2. Dystrophic calcifications along the plantar aspect of the hindfoot with some differential possibilities that may include soft tissue calcifications from prior surgical intervention, prior injection for plantar fasciitis or calcified plantar fibromatosis among some possibilities though not exclusive. Electronically Signed   By: Tollie Eth M.D.   On: 03/29/2017 23:29     ASSESSMENT AND PLAN  82 y.o. female white female with past medical history of CVA with residual right hemiparesis, diabetes,  hypertension, COPD, atrial fibrillation on Eliquis, peripheral vascular disease presents with right-sided weakness and her speech since 8 AM this morning. Unlikely to be LVO as patient has no neglect. Outside tpA window. CT head does not show large acute stroke.   Acute Ischemic Stroke vs Recrudescence of prior stroke symptoms   Recommend # MRI of the brain without contrast #MRA Head and neck  #Transthoracic Echo  # Start patient on ASA 325 mg , HOLD ELIQUIS UNTIL MRI TO see size of stroke  # HBAIC and Lipid profile # Telemetry monitoring # Frequent neuro checks # NPO until passes stroke swallow screen  Please page stroke NP  Or  PA  Or MD from 8am -4 pm  as this patient from this time will be  followed by the stroke.   You can look them up on [email or web address]  Password [email or web address]   Addendum  I reviewed the patient's MRI brain : Acute left pontine stroke noted on MRI which explains the patient's symptoms. Etiology of her stroke is likely small vessel disease with patient having multiple vascular risk factors. She also has intracranial atherosclerotic disease. Her current stroke is likely not caused by her atrial fibrillation I would not classify this as apixaban failure. I will leave it up to the stroke team tomorrow to consider the addition of aspirin. Her stroke is small and we can restart her Eliquis today. Please see order to restart her medication    Sushanth Aroor [facility] Pager Number [phone]

## 2017-03-30 NOTE — H&P (Addendum)
TRH H&P   Patient Demographics:    Casey Harrell, is a 82 y.o. female  MRN: [id number]   DOB - 07/12/1934  Admit Date - 03/29/2017  Outpatient Primary MD for the patient is [name], MD  Referring MD/NP/PA: Elpidio Anis  Outpatient Specialists:   Patient coming from: home  Chief Complaint  Patient presents with  . Weakness    LSN 0800 03/29/17      HPI:    Casey Harrell  is a 82 y.o. female, w hypertension, hyperlipidemia, dm2 with neuropathy, CVA, PVD<, Pafib, apparently presents with c/o right side specifically right leg weakness starting at 8am yesterday. Pt per family has slight difficulty with speech.   She apparently worse this evening and had difficulty while eating supper with her speech .  Pt denies headache, cp, palp, sob, n/v, diarrhea, brbpr.  +numbness, tingling right upper and lower ext. Denies difficulty with swallowing or speech though her speech appears slightly slurred.  Pt was brought to ED for evaluation r/o CVA .   In ED  CT Brain  IMPRESSION: No acute intracranial abnormalities. Chronic atrophy and small vessel ischemic changes. Multifocal old infarcts. No change since prior study.  CXR no acute process  Trop I negative  Wbc 14.7, Hgb 11.6, Plt 258 Na 135, K 4.1,  Glucose 269, Bun 28, Creatinine 1.18 PTT 32, INR 1.12 Urinalysis pending  Pt will be admitted for possible stroke   Review of systems:    In addition to the HPI above,  No Fever-chills, No Headache, No changes with Vision or hearing, No problems swallowing food or Liquids, No Chest pain, Cough or Shortness of Breath, No Abdominal pain, No Nausea or Vommitting, Bowel movements are regular, No Blood in stool or Urine, No dysuria, No new skin rashes or bruises, No new joints pains-aches,   No recent weight gain or loss, No polyuria, polydypsia or polyphagia, No  significant Mental Stressors.  A full 10 point Review of Systems was done, except as stated above, all other Review of Systems were negative.   With Past History of the following :    Past Medical History:  Diagnosis Date  . ASTHMA 07/15/2008  . COPD (chronic obstructive pulmonary disease) (HCC)   . DIABETES-TYPE 2 07/15/2008  . DIABETIC PERIPHERAL NEUROPATHY 09/02/2009  . GERD 07/15/2008  . HYPERTENSION 07/15/2008  . Normal cardiac stress test    low risk Nuc 2009, Feb 2011  . Paroxysmal atrial fibrillation (HCC)   . PVD (peripheral vascular disease) (HCC) 06/18/09   RCE  . Stroke (HCC)   . Vertigo       Past Surgical History:  Procedure Laterality Date  . ABDOMINAL HYSTERECTOMY  1971  . APPENDECTOMY  1971  . CAROTID ENDARTERECTOMY  06/18/09   RCE  . CESAREAN SECTION    . colectomy and colostomy    . FLEXIBLE SIGMOIDOSCOPY  diverticulitis  . FOOT SURGERY     right foot X 2  . LAPAROTOMY     X 3 with adhesions lysis  . TONSILLECTOMY    . TUBAL LIGATION        Social History:     Social History   Tobacco Use  . Smoking status: Never Smoker  . Smokeless tobacco: Never Used  Substance Use Topics  . Alcohol use: No     Lives - at home with husband who has stage 4 lung cancer  Mobility - walks with assistance.    Family History :     Family History  Problem Relation Age of Onset  . Diabetes Mother   . Arthritis Mother   . Heart disease Mother   . Heart disease Father   . Heart disease Sister   . Heart disease Brother   . Cancer Brother 60       lung cancer  . Celiac disease Sister   . Stroke Maternal Grandfather   . Cancer Paternal Grandfather        lung  . Heart disease Sister   . Asthma Sister       Home Medications:   Prior to Admission medications   Medication Sig Start Date End Date Taking? Authorizing Provider  acetaminophen (TYLENOL) 325 MG tablet Take 650 mg by mouth every 6 (six) hours as needed (pain).    [provider]   albuterol (ACCUNEB) 1.25 MG/3ML nebulizer solution Take 3 mLs (1.25 mg total) by nebulization every 4 (four) hours as needed for wheezing. Reported on 04/26/2015 05/13/15   Jeralyn Bennett, MD  apixaban (ELIQUIS) 5 MG TABS tablet TAKE 1 TABLET(5 MG) BY MOUTH TWICE DAILY 05/05/16   Runell Gess, MD  atorvastatin (LIPITOR) 40 MG tablet TAKE 1 TABLET BY MOUTH EVERY DAY 03/01/16   Shelva Majestic, MD  Blood Glucose Monitoring Suppl (ACCU-CHEK AVIVA) device Use as instructed.  Dx E11.9 12/18/16   [name], MD  CALCIUM PO Take 1 tablet by mouth daily.    [provider]  carvedilol (COREG) 6.25 MG tablet TAKE 1 TABLET BY MOUTH TWICE A DAY WITH A MEAL 12/04/16   Burchette, Elberta Fortis, MD  cephALEXin (KEFLEX) 500 MG capsule Take 1 capsule (500 mg total) by mouth 3 (three) times daily. 02/07/17   Burchette, Elberta Fortis, MD  Cholecalciferol (VITAMIN D3) 2000 UNITS TABS Take 2,000 Units by mouth daily. Reported on 04/26/2015    [provider]  ciprofloxacin (CIPRO) 500 MG tablet Take 1 tablet (500 mg total) by mouth 2 (two) times daily. 11/03/16   Gordy Savers, MD  DULoxetine (CYMBALTA) 30 MG capsule TAKE 1 CAPSULE BY MOUTH EVERY DAY FOR 2 WEEKS, THEN INCREASE TO 1 CAPSULE TWICE A DAY 03/01/17   Burchette, Elberta Fortis, MD  glucose blood (ACCU-CHEK AVIVA) test strip Test twice daily. Dx E11.9 02/28/17   [name], MD  HUMALOG KWIKPEN 100 UNIT/ML KiwkPen INJECT 9-14 UNITS SUBCUTANEOUSLY TWICE DAILY WITH MEAL 9 UNITS BREAKFAST AND 14 UNITS AFTER SUPPER 03/07/17   Burchette, Elberta Fortis, MD  Insulin Pen Needle (PEN NEEDLES 31GX5/16") 31G X 8 MM MISC Use once daily 03/13/17   Burchette, Elberta Fortis, MD  ipratropium-albuterol (DUONEB) 0.5-2.5 (3) MG/3ML SOLN Take 3 mLs by nebulization every 6 (six) hours as needed (shortness of breath or wheezing). 04/20/15   Love, Evlyn Kanner, PA-C  irbesartan (AVAPRO) 150 MG tablet TAKE 1 TABLET BY MOUTH EVERY DAY 11/13/16   Burchette,  Elberta Fortis, MD  LEVEMIR  FLEXTOUCH 100 UNIT/ML Pen INJECT 40 UNITS INTO THE SKIN TWICE A DAY Patient taking differently: INJECT 30 UNITS INTO THE SKIN TWICE A DAY 05/29/16   Burchette, Elberta Fortis, MD  levothyroxine (SYNTHROID, LEVOTHROID) 50 MCG tablet TAKE 1 TABLET BY MOUTH EVERY DAY BEFORE BREAKFAST 03/05/17   Burchette, Elberta Fortis, MD  LYRICA 150 MG capsule TAKE ONE CAPSULE BY MOUTH EVERY DAY 03/05/17   Burchette, Elberta Fortis, MD  meclizine (ANTIVERT) 25 MG tablet Take 1 tablet (25 mg total) by mouth 3 (three) times daily as needed for dizziness. 06/28/16   Burchette, Elberta Fortis, MD  metFORMIN (GLUCOPHAGE) 1000 MG tablet TAKE 1 TABLET BY MOUTH TWICE A DAY WITH A MEAL 02/13/17   Burchette, Elberta Fortis, MD  ondansetron (ZOFRAN ODT) 4 MG disintegrating tablet Take 1 tablet (4 mg total) by mouth every 8 (eight) hours as needed for nausea or vomiting. 11/10/16   Little, Ambrose Finland, MD  ondansetron (ZOFRAN) 4 MG tablet Take 1 tablet (4 mg total) by mouth every 8 (eight) hours as needed for nausea or vomiting. 10/31/16   Gordy Savers, MD  pantoprazole (PROTONIX) 40 MG tablet TAKE 1 TABLET BY MOUTH TWICE A DAY 11/22/16   Burchette, Elberta Fortis, MD  pregabalin (LYRICA) 100 MG capsule Take 1 capsule (100 mg total) by mouth daily. 02/25/16   Burchette, Elberta Fortis, MD     Allergies:     Allergies  Allergen Reactions  . Doxycycline Hyclate Nausea And Vomiting  . Morphine Sulfate Other (See Comments)    REACTION: hallucinations  . Iohexol Hives     Code: HIVES, Desc: pt. states she breaks out in hives 06/15/08   . Moxifloxacin Other (See Comments)    REACTION: unknown     Physical Exam:   Vitals  Blood pressure (!) 130/58, pulse 82, temperature 98.2 F (36.8 C), resp. rate (!) 23, SpO2 95 %.   1. General  lying in bed in NAD,    2. Normal affect and insight, Not Suicidal or Homicidal, Awake Alert, Oriented X 3.  3. No F.N deficits, ALL C.Nerves Intact, Sensation intact all 4 extremities, Plantars down going left, and upgoing on the  right Slight slurred speech,  Motor 5-/5 right upper and lower ext  4. Ears and Eyes appear Normal, Conjunctivae clear, PERRLA. Moist Oral Mucosa.  5. Supple Neck, No JVD, No cervical lymphadenopathy appriciated, No Carotid Bruits.  6. Symmetrical Chest wall movement, Good air movement bilaterally, CTAB.  7. RRR, No Gallops, Rubs or Murmurs, No Parasternal Heave.  8. Positive Bowel Sounds, Abdomen Soft, No tenderness, No organomegaly appriciated,No rebound -guarding or rigidity.  9.  No Cyanosis, Normal Skin Turgor, No Skin Rash or Bruise.  10. Good muscle tone,  joints appear normal , no effusions, Normal ROM.  11. No Palpable Lymph Nodes in Neck or Axillae     Data Review:    CBC Recent Labs  Lab 03/29/17 2224 03/29/17 2243  WBC 14.7*  --   HGB 11.6* 13.6  HCT 35.5* 40.0  PLT 258  --   MCV 84.9  --   MCH 27.8  --   MCHC 32.7  --   RDW 15.5  --   LYMPHSABS 3.1  --   MONOABS 1.4*  --   EOSABS 0.4  --   BASOSABS 0.0  --    ------------------------------------------------------------------------------------------------------------------  Chemistries  Recent Labs  Lab 03/29/17 2224 03/29/17 2243  NA 135 136  K 4.1  4.1  CL 101 102  CO2 22  --   GLUCOSE 269* 259*  BUN 28* 33*  CREATININE 1.18* 1.00  CALCIUM 9.1  --   AST 22  --   ALT 12*  --   ALKPHOS 66  --   BILITOT 0.7  --    ------------------------------------------------------------------------------------------------------------------ CrCl cannot be calculated (Unknown ideal weight.). ------------------------------------------------------------------------------------------------------------------ No results for input(s): TSH, T4TOTAL, T3FREE, THYROIDAB in the last 72 hours.  Invalid input(s): FREET3  Coagulation profile Recent Labs  Lab 03/29/17 2224  INR 1.12   ------------------------------------------------------------------------------------------------------------------- No results  for input(s): DDIMER in the last 72 hours. -------------------------------------------------------------------------------------------------------------------  Cardiac Enzymes No results for input(s): CKMB, TROPONINI, MYOGLOBIN in the last 168 hours.  Invalid input(s): CK ------------------------------------------------------------------------------------------------------------------    Component Value Date/Time   BNP 34.2 06/21/2015 0507     ---------------------------------------------------------------------------------------------------------------  Urinalysis    Component Value Date/Time   COLORURINE YELLOW 11/09/2016 2142   APPEARANCEUR CLEAR 11/09/2016 2142   LABSPEC 1.008 11/09/2016 2142   PHURINE 6.0 11/09/2016 2142   GLUCOSEU 50 (A) 11/09/2016 2142   HGBUR NEGATIVE 11/09/2016 2142   BILIRUBINUR neg 02/07/2017 1438   KETONESUR NEGATIVE 11/09/2016 2142   PROTEINUR pos 02/07/2017 1438   PROTEINUR 30 (A) 11/09/2016 2142   UROBILINOGEN 0.2 02/07/2017 1438   UROBILINOGEN 0.2 11/05/2014 1555   NITRITE neg 02/07/2017 1438   NITRITE NEGATIVE 11/09/2016 2142   LEUKOCYTESUR Trace (A) 02/07/2017 1438    ----------------------------------------------------------------------------------------------------------------   Imaging Results:    Dg Ankle Complete Right  Result Date: 03/29/2017 CLINICAL DATA:  Right ankle pain after fall. EXAM: RIGHT ANKLE - COMPLETE 3+ VIEW COMPARISON:  None. FINDINGS: There is moderate soft tissue swelling over the lateral malleolus. A linear lucency is seen involving the metaphysis of the distal fibula on one view only and is believed to be secondary to overlap with the tibia. No significant joint effusion. The tibiotalar, subtalar and midfoot articulations are maintained. There are nonspecific subcutaneous soft tissue calcifications along the plantar aspect of the hindfoot possibly related prior injection for plantar fasciitis versus calcified  fibromas/ plantar calcified fibromatosis. IMPRESSION: 1. Soft tissue swelling over the lateral malleolus. No acute fracture nor dislocations identified. 2. Coarse subcutaneous soft tissue calcifications along the plantar aspect of the hindfoot, nonspecific in etiology. Electronically Signed   By: Tollie Eth M.D.   On: 03/29/2017 23:26   Ct Head Wo Contrast  Result Date: 03/29/2017 CLINICAL DATA:  Focal neurological deficit for greater than 6 hours. Progressive weakness over the day. Losing control of fine motor skills. History of stroke with right-sided weakness. EXAM: CT HEAD WITHOUT CONTRAST TECHNIQUE: Contiguous axial images were obtained from the base of the skull through the vertex without intravenous contrast. COMPARISON:  MRI brain 06/20/2015.  CT head 06/20/2015 FINDINGS: Brain: Diffuse cerebral atrophy. Ventricular dilatation consistent with central atrophy. Low-attenuation changes throughout the deep white matter consistent small vessel ischemia. Multiple old lacunar infarcts seen bilaterally. Focal encephalomalacia in the occipital lobes bilaterally and in the left parietal lobe consistent with old infarcts. No significant changes since previous study. No mass-effect or midline shift. No abnormal extra-axial fluid collections. Gray-white matter junctions are distinct. Basal cisterns are not effaced. No acute intracranial hemorrhage. Vascular: Intracranial arterial vascular calcifications are present. Skull: Calvarium appears intact. Sinuses/Orbits: Mucosal thickening in the paranasal sinuses. No acute air-fluid levels. Mastoid air cells are clear. Other: None. IMPRESSION: No acute intracranial abnormalities. Chronic atrophy and small vessel ischemic changes. Multifocal old infarcts. No change since prior study.  Electronically Signed   By: Burman Nieves M.D.   On: 03/29/2017 23:37   Dg Foot Complete Right  Result Date: 03/29/2017 CLINICAL DATA:  Right foot pain and weakness. EXAM: RIGHT FOOT  COMPLETE - 3+ VIEW COMPARISON:  None. FINDINGS: Soft tissue calcifications are seen along the plantar aspect of the hindfoot could potentially represent calcified fibromatosis along the plantar fascia versus stigmata of prior subcutaneous injection for plantar fasciitis or dystrophic calcifications from prior surgical intervention. No acute fractures noted. Suggesting of small extra-articular erosion or cortical defect along the lateral aspect of the fifth metatarsal head -neck. No acute joint dislocation nor parrot fracture. IMPRESSION: 1. Negative for acute osseous abnormality. 2. Dystrophic calcifications along the plantar aspect of the hindfoot with some differential possibilities that may include soft tissue calcifications from prior surgical intervention, prior injection for plantar fasciitis or calcified plantar fibromatosis among some possibilities though not exclusive. Electronically Signed   By: Tollie Eth M.D.   On: 03/29/2017 23:29      Assessment & Plan:    Principal Problem:   Stroke [facility]) Active Problems:   Hypothyroidism   Type 2 diabetes mellitus, uncontrolled (HCC)   Paroxysmal atrial fibrillation (HCC)   Anemia    Right sided weakness likely CVA Aspirin 325mg  po x1 eliquis pharmacy to dose MRI/ MRA brain Carotid ultrasound Cardiac echo Check hga1c, lipid Neurology consulted by ED, (outside tpa window)  Pafib Trop I q6h x3 Check cardiac echo eliquis as above  Dm2 with neuropathy fsbs q4h, ISS HOLD long acting insulin til passes bedside swallow Cont metformin  Anemia Repeat cbc in am  Hypothyroidism Check tsh Continue levothyroxine  Dehydration Ns iv Check cmp in am    DVT Prophylaxis  Eliquis  AM Labs Ordered, also please review Full Orders  Family Communication: Admission, patients condition and plan of care including tests being ordered have been discussed with the patient  who indicate understanding and agree with the plan and Code  Status.  Code Status FULL CODE  Likely DC to  home  Condition GUARDED    Consults called: neurology  Admission status: inpatient  Time spent in minutes : 45   Pearson Grippe M.D on 03/30/2017 at 12:48 AM  Between 7am to 7pm - Pager - [phone]    After 7pm go to [email or web address] - password [facility]  [facility] - Office  [phone]

## 2017-03-31 LAB — GLUCOSE, CAPILLARY
Glucose-Capillary: 169 mg/dL — ABNORMAL HIGH (ref 65–99)
Glucose-Capillary: 331 mg/dL — ABNORMAL HIGH (ref 65–99)
Glucose-Capillary: 208 mg/dL — ABNORMAL HIGH (ref 65–99)
Glucose-Capillary: 195 mg/dL — ABNORMAL HIGH (ref 65–99)
Glucose-Capillary: 347 mg/dL — ABNORMAL HIGH (ref 65–99)

## 2017-03-31 LAB — LIPID PANEL
Cholesterol: 102 mg/dL (ref 0–200)
HDL: 27 mg/dL — ABNORMAL LOW (ref >40)
LDL Cholesterol: 57 mg/dL (ref 0–99)
Total CHOL/HDL Ratio: 3.8 RATIO
Triglycerides: 92 mg/dL (ref <150)
VLDL: 18 mg/dL (ref 0–40)

## 2017-03-31 MED ORDER — INSULIN DETEMIR 100 UNIT/ML ~~LOC~~ SOLN
10.0000 [IU] | Freq: Every day | SUBCUTANEOUS | Status: DC
  Administered 2017-03-31 – 2017-04-01 (×2): 10 [IU] via SUBCUTANEOUS
  Filled 2017-03-31 (×2): qty 0.1

## 2017-03-31 NOTE — Evaluation (Signed)
Speech Language Pathology Evaluation Patient Details Name: Casey Harrell MRN: [id number] DOB: 10/20/1934 Today's Date: 03/31/2017 Time: 1110-1120 SLP Time Calculation (min) (ACUTE ONLY): 10 min  Problem List:  Patient Active Problem List   Diagnosis Date Noted  . Anemia 03/30/2017  . History of stroke   . Chest pain 02/09/2016  . BPPV (benign paroxysmal positional vertigo) 08/25/2015  . HLD (hyperlipidemia) 08/25/2015  . At high risk for falls 07/01/2015  . Vertigo 06/21/2015  . Paroxysmal atrial fibrillation (HCC) 06/11/2015  . Cerebral infarction due to stenosis of left middle cerebral artery (HCC) 05/20/2015  . Intracranial vascular stenosis 05/20/2015  . Type 2 diabetes mellitus with circulatory disorder (HCC) 05/20/2015  . Type 2 diabetes, uncontrolled, with neuropathy (HCC)   . Abdominal pain   . Partial small bowel obstruction (HCC)   . Benign essential HTN   . Small bowel obstruction (HCC) 05/07/2015  . Hemiplegia, unspecified affecting right dominant side (HCC) 04/08/2015  . Embolic stroke involving posterior cerebral artery (HCC) 04/05/2015  . Orthostatic hypotension   . Hypertensive urgency 04/01/2015  . Fever in adult 04/01/2015  . Acute encephalopathy 04/01/2015  . Confusion   . CVA (cerebral infarction)   . Posterior circulation stroke (HCC)   . Acute ischemic stroke (HCC) 03/31/2015  . Stroke (HCC) 03/31/2015  . CKD (chronic kidney disease) stage 3, GFR 30-59 ml/min (HCC) 04/17/2014  . Gastroenteritis, non-infectious 04/11/2014  . Nausea vomiting and diarrhea 04/10/2014  . Hypokalemia 04/10/2014  . Dehydration 04/09/2014  . Bronchitis with airway obstruction (HCC) 02/03/2013  . Leukocytosis 02/03/2013  . Fever 02/03/2013  . Acute respiratory failure with hypoxia (HCC) 02/03/2013  . Depression 02/03/2013  . Hyponatremia 02/03/2013  . Obesity (BMI 30-39.9) 01/10/2013  . PVD (peripheral vascular disease)- S/P RCE 06/18/09 12/13/2012  . Sleep apnea- C-pap  intol 12/13/2012  . Low risk cardiac nuclear stress test- 2009 and Feb 2011 12/13/2012  . Mild persistent asthma 11/29/2011  . Chronic cough 06/05/2011  . Diabetic neuropathy (HCC) 09/02/2009  . Dyslipidemia 07/13/2009  . Hypothyroidism 11/12/2008  . Type 2 diabetes mellitus, uncontrolled (HCC) 07/15/2008  . Essential hypertension 07/15/2008  . GERD 07/15/2008  . DIVERTICULOSIS, COLON 07/15/2008   Past Medical History:  Past Medical History:  Diagnosis Date  . ASTHMA 07/15/2008  . COPD (chronic obstructive pulmonary disease) (HCC)   . DIABETES-TYPE 2 07/15/2008  . DIABETIC PERIPHERAL NEUROPATHY 09/02/2009  . GERD 07/15/2008  . HYPERTENSION 07/15/2008  . Normal cardiac stress test    low risk Nuc 2009, Feb 2011  . Paroxysmal atrial fibrillation (HCC)   . PVD (peripheral vascular disease) (HCC) 06/18/09   RCE  . Stroke (HCC)   . Vertigo    Past Surgical History:  Past Surgical History:  Procedure Laterality Date  . ABDOMINAL HYSTERECTOMY  1971  . APPENDECTOMY  1971  . CAROTID ENDARTERECTOMY  06/18/09   RCE  . CESAREAN SECTION    . colectomy and colostomy    . FLEXIBLE SIGMOIDOSCOPY     diverticulitis  . FOOT SURGERY     right foot X 2  . LAPAROTOMY     X 3 with adhesions lysis  . TONSILLECTOMY    . TUBAL LIGATION     HPI:  82 y.o. female white female with past medical history of mild dementia, CVA with residual right hemiparesis, diabetes, hypertension, COPD, atrial fibrillation on Eliquis, peripheral vascular disease presented with right-sided weakness 03/29/17. Pt also admitted to [facility] in 03/2015 at which time MRI showed small  patchy left MCA infarcts with remote bilateral occipital infarcts, embolic pattern. Pt had resulting mild fluent aphasia complicated by baseline dementia; she received treatment in CIR and was d/c at supervision level. This admission, MRI 03/31/17 showed small acute infarct of the left pons measuring approximately 8 x 3 mm.    Assessment / Plan /  Recommendation Clinical Impression  Patient presents with mild-moderate dysarthria which impacts intelligibility in conversation mildly (>95% intelligible). She does have a history of impairments at baseline (memory, mild aphasia), though she reports feeling she is having increased difficulty with wordfinding, dysarthria this admission. Prior to this admission pt reports she was independent with cooking, basic household duties. Recommend she receive skilled ST in next venue of care to address communication, cognition. SLP will follow acutely.    SLP Assessment  SLP Recommendation/Assessment: Patient needs continued Speech Lanaguage Pathology Services SLP Visit Diagnosis: Aphasia (R47.01);Cognitive communication deficit (R41.841)    Follow Up Recommendations  Other (comment)(TBD)    Frequency and Duration min 1 x/week  1 week      SLP Evaluation Cognition  Overall Cognitive Status: Impaired/Different from baseline Arousal/Alertness: Awake/alert Orientation Level: Oriented to person;Oriented to place;Oriented to situation Attention: Focused;Sustained Focused Attention: Appears intact Sustained Attention: Appears intact Memory: Impaired Memory Impairment: Decreased short term memory Decreased Short Term Memory: Verbal basic;Functional basic(pt did not recall OT having visited her this morning) Awareness: Appears intact       Comprehension  Auditory Comprehension Overall Auditory Comprehension: Appears within functional limits for tasks assessed Yes/No Questions: Within Functional Limits Commands: Impaired Multistep Basic Commands: 50-74% accurate Conversation: Simple Visual Recognition/Discrimination Discrimination: Within Function Limits Reading Comprehension Reading Status: Not tested    Expression Expression Primary Mode of Expression: Verbal Verbal Expression Overall Verbal Expression: Impaired at baseline Level of Generative/Spontaneous Verbalization:  Conversation Repetition: Impaired Level of Impairment: Sentence level Naming: Impairment Responsive: 76-100% accurate Confrontation: Impaired Convergent: 50-74% accurate Divergent: Not tested Pragmatics: No impairment Interfering Components: Premorbid deficit Effective Techniques: Open ended questions;Semantic cues;Sentence completion;Phonemic cues Written Expression Dominant Hand: Right Written Expression: Not tested   Oral / Motor  Oral Motor/Sensory Function Overall Oral Motor/Sensory Function: Mild impairment Facial ROM: Reduced right Facial Symmetry: Abnormal symmetry right Facial Strength: Reduced right Facial Sensation: Within Functional Limits Lingual ROM: Within Functional Limits Lingual Symmetry: Within Functional Limits Lingual Strength: Within Functional Limits Lingual Sensation: Within Functional Limits Velum: Within Functional Limits Mandible: Within Functional Limits Motor Speech Overall Motor Speech: Impaired Respiration: Within functional limits Phonation: Normal Resonance: Within functional limits Articulation: Impaired Level of Impairment: Sentence Intelligibility: Intelligibility reduced Word: 75-100% accurate Phrase: 75-100% accurate Sentence: 75-100% accurate Conversation: 75-100% accurate(95%) Motor Planning: Witnin functional limits Motor Speech Errors: Aware Interfering Components: Premorbid status   GO                   Rondel Baton, MS, CCC-SLP Speech-Language Pathologist [phone] Arlana Lindau 03/31/2017, 3:40 PM

## 2017-03-31 NOTE — Progress Notes (Signed)
5 beats of SVT noticed by CCMD so RN was notified. Patient was checked on, sleeping and doing well.

## 2017-03-31 NOTE — Evaluation (Signed)
Occupational Therapy Evaluation Patient Details Name: Casey Harrell MRN: [id number] DOB: 09-29-1934 Today's Date: 03/31/2017    History of Present Illness 82 y.o. female white female with past medical history of CVA with residual right hemiparesis, diabetes, hypertension, COPD, atrial fibrillation on Eliquis, peripheral vascular disease presents with right-sided weakness and her speech since 8 AM this morning.Was admitted to [facility] 03/31/2015 for agitation and increased confusion.  MRI showed small patchy left MCA infarcts with remote bilateral occipital infarcts, embolic pattern.  Workup during that admission revealed the patient also had intracranial atherosclerotic disease and was placed on dual antiplatelets.    Clinical Impression   Pt reports she was mod I with ADL PTA and used a RW for mobility. Currently pt requires max assist +2 for stand pivot transfer and max assist overall for ADL. Pt presenting with increased R sided weakness from baseline, poor balance, and pain impacting her independence and safety with ADL and functional mobility. Recommending CIR level therapies to maximize independence and safety with ADL and functional mobility prior to return home with family supervision. Pt would benefit from continued skilled OT to address established goals.    Follow Up Recommendations  CIR;Supervision/Assistance - 24 hour    Equipment Recommendations  Other (comment)(TBD at next venue)    Recommendations for Other Services Rehab consult     Precautions / Restrictions Precautions Precautions: Fall Restrictions Weight Bearing Restrictions: No      Mobility Bed Mobility Overal bed mobility: Needs Assistance Bed Mobility: Supine to Sit     Supine to sit: Mod assist     General bed mobility comments: Cues for technique. HOB flat with use of bed rail. Assist for RLE to EOB and trunk elevation to sitting  Transfers Overall transfer level: Needs assistance    Transfers: Sit to/from Stand;Stand Pivot Transfers Sit to Stand: Max assist Stand pivot transfers: Max assist;+2 physical assistance       General transfer comment: Blocked R knee and foot, cues for sequencing and safety. Pt able to take small pivotal steps toward L side.     Balance Overall balance assessment: Needs assistance Sitting-balance support: Feet supported;Single extremity supported Sitting balance-Leahy Scale: Poor Sitting balance - Comments: Unable to release LUE support in sitting     Standing balance-Leahy Scale: Zero Standing balance comment: max assist for standing balance                           ADL either performed or assessed with clinical judgement   ADL Overall ADL's : Needs assistance/impaired Eating/Feeding: Moderate assistance;Sitting   Grooming: Moderate assistance;Sitting   Upper Body Bathing: Maximal assistance;Sitting   Lower Body Bathing: Maximal assistance;+2 for physical assistance;Sit to/from stand   Upper Body Dressing : Maximal assistance;Sitting   Lower Body Dressing: Maximal assistance;+2 for physical assistance;Sit to/from stand   Toilet Transfer: Maximal assistance;+2 for physical assistance;Stand-pivot;BSC Toilet Transfer Details (indicate cue type and reason): Simulated by transfer EOB>chair         Functional mobility during ADLs: Maximal assistance;+2 for physical assistance(for stand pivot only) General ADL Comments: Discussed post acute rehab, pt agreeable. No c/o dizziness this session.     Vision   Additional Comments: Needs further assessment     Perception     Praxis      Pertinent Vitals/Pain Pain Assessment: Faces Faces Pain Scale: Hurts even more Pain Location: R foot Pain Descriptors / Indicators: Grimacing;Guarding Pain Intervention(s): Monitored during session;Repositioned  Hand Dominance Right   Extremity/Trunk Assessment Upper Extremity Assessment Upper Extremity Assessment: RUE  deficits/detail RUE Deficits / Details: grossly 2/5, limited grip strength. Pt reports sensation intact RUE Coordination: decreased fine motor;decreased gross motor   Lower Extremity Assessment Lower Extremity Assessment: Defer to PT evaluation   Cervical / Trunk Assessment Cervical / Trunk Assessment: Kyphotic   Communication Communication Communication: No difficulties   Cognition Arousal/Alertness: Awake/alert Behavior During Therapy: WFL for tasks assessed/performed Overall Cognitive Status: Within Functional Limits for tasks assessed                                     General Comments       Exercises     Shoulder Instructions      Home Living Family/patient expects to be discharged to:: Private residence Living Arrangements: Spouse/significant other Available Help at Discharge: Family;Available 24 hours/day Type of Home: House Home Access: Stairs to enter [employer] of Steps: 6 Entrance Stairs-Rails: Can reach both Home Layout: One level     Bathroom Shower/Tub: Walk-in shower;Door   [employer] Toilet: Standard     Home Equipment: Environmental consultant - 4 wheels;Bedside commode;Shower seat - built in;Grab bars - tub/shower;Hand held shower head          Prior Functioning/Environment Level of Independence: Independent with assistive device(s)        Comments: ambulated with RW, no assist with ADLs, iADLs         OT Problem List: Decreased strength;Decreased range of motion;Decreased activity tolerance;Impaired balance (sitting and/or standing);Decreased coordination;Impaired sensation;Impaired tone;Impaired UE functional use;Pain      OT Treatment/Interventions: Self-care/ADL training;Therapeutic exercise;Neuromuscular education;Energy conservation;DME and/or AE instruction;Therapeutic activities;Patient/family education;Balance training    OT Goals(Current goals can be found in the care plan section) Acute Rehab OT Goals Patient Stated  Goal: return to PLOF OT Goal Formulation: With patient Time For Goal Achievement: 04/14/17 Potential to Achieve Goals: Good ADL Goals Pt Will Perform Eating: with set-up;with adaptive utensils;sitting Pt Will Perform Grooming: with min assist;sitting Pt Will Transfer to Toilet: with mod assist;ambulating;bedside commode Pt Will Perform Toileting - Clothing Manipulation and hygiene: with min assist;sit to/from stand Pt/caregiver will Perform Home Exercise Program: Right Upper extremity;Increased strength;Increased ROM;With written HEP provided  OT Frequency: Min 3X/week   Barriers to D/C:            Co-evaluation              AM-PAC PT "6 Clicks" Daily Activity     Outcome Measure Help from another person eating meals?: A Lot Help from another person taking care of personal grooming?: A Lot Help from another person toileting, which includes using toliet, bedpan, or urinal?: A Lot Help from another person bathing (including washing, rinsing, drying)?: A Lot Help from another person to put on and taking off regular upper body clothing?: A Lot Help from another person to put on and taking off regular lower body clothing?: A Lot 6 Click Score: 12   End of Session Equipment Utilized During Treatment: Gait belt Nurse Communication: Mobility status  Activity Tolerance: Patient tolerated treatment well Patient left: in chair;with call bell/phone within reach;with chair alarm set;with nursing/sitter in room;with family/visitor present  OT Visit Diagnosis: Unsteadiness on feet (R26.81);Other abnormalities of gait and mobility (R26.89);Muscle weakness (generalized) (M62.81);Hemiplegia and hemiparesis Hemiplegia - Right/Left: Right Hemiplegia - dominant/non-dominant: Dominant Hemiplegia - caused by: Cerebral infarction  Time: 3474-2595 OT Time Calculation (min): 16 min Charges:  OT General Charges $OT Visit: 1 Visit OT Evaluation $OT Eval Moderate Complexity: 1  Mod G-Codes:     Marguerite Barba A. Brett Albino, M.S., OTR/L Pager: [phone]  Gaye Alken 03/31/2017, 9:00 AM

## 2017-03-31 NOTE — Progress Notes (Signed)
PROGRESS NOTE    Casey Harrell  JYN:[id number] DOB: 04-01-1935 DOA: 03/29/2017 PCP: [name], MD    Brief Narrative:  82 year old female who presents with weakness. Patient does have the significant past medical history for hypertension, dyslipidemia, type 2 diabetes mellitus, and paroxysmal atrial fibrillation with a prior CVA. She presents with 24-hour history of weakness in her right lower extremity, associated with difficulty speaking, and dysphagia. On the initial physical examination blood pressure 130/58, heart rate 82, temperature 98.2, respiratory 23, oxygen saturation 95%. Moist mucous membranes, lungs clear to auscultation bilaterally, heart S1-S2 present rhythmic, abdomen soft nontender, no lower extremity edema. Sodium 136, potassium 4.1, chloride 102, bicarbonate 22, glucose 259, BUN 33, creatinine 1.0, white count 14.7, hemoglobin 11.6, hematocrit 35.5, platelets 258, urinalysis negative for infection, urine drug screen negative. Head CT with no acute intracranial changes. Chronic atrophy and small vessel ischemic changes, multifocal old infarcts. Chest x-ray, hypoinflated, left rotation, no infiltrates.   Patient was admitted to the hospital with the working diagnosis of focal neurologic deficit to rule out CVA.   Assessment & Plan:   Principal Problem:   Stroke [facility]) Active Problems:   Hypothyroidism   Type 2 diabetes mellitus, uncontrolled (HCC)   Paroxysmal atrial fibrillation (HCC)   Anemia   History of stroke   1. Acute ischemic CVA, small acute infarct, off the left pons, 8 x 3 mm. Persistent right hemiparesis, more predominant on the right lower extremity, echocardiography with 55 to 60% EF with LVH. Will continue aspirin and apixaban for anticoagulation, continue statin therapy. Physical therapy recommendation for snf or inpatient rehab.   2. Paroxysmal atrial fibrillation. Continue rate control with carvedilol, anticoagulation with apixaban and aspirin  with good toleration.   4. HTN. Tolerating well irbesartan and carvedilol, systolic blood pressure 169 to 179 mmHg, if persistent uncontrolled HTN will add calcium channel blocker in am.    3. Type 2 diabetes mellitus. Uncontrolled hyperglycemia, capillary glucose 228, 176, 169, 331, 208.  will resume long acting insulin with levimir 10 units and will continue glucose cover and monitoring with insulin sliding scale. At home patient on insulin levimir 30 units bid.   4. Hypothyroidism. On levothyroxine.   5. Anemia. Hb and hct stable.  6. Depression. On duloxetine.   7. Right ankle edema. Improved edema and pain, will continue topical analgesia with voltaren gel.   DVT prophylaxis: apixaban  Code Status: full Family Communication: I spoke with patient's family at the bedside and all questions were addressed.  Disposition Plan: rehab.    Consultants:   Neurology   Procedures:     Antimicrobials:      Subjective: Right ankle pain has improved, no nausea or vomiting, no chest pain or dyspnea. Tolerating po well, noted hyperglycemia.   Objective: Vitals:   03/30/17 2157 03/31/17 0110 03/31/17 0436 03/31/17 0854  BP: (!) 161/66 (!) 179/79 (!) 176/71 (!) 169/76  Pulse: 68 72 64 68  Resp: 18 18 20 20   Temp: 98.2 F (36.8 C) 98.1 F (36.7 C) 97.9 F (36.6 C) (!) 97.4 F (36.3 C)  TempSrc: Oral Oral Oral Oral  SpO2: 98% 99% 96% 94%  Weight:      Height:        Intake/Output Summary (Last 24 hours) at 03/31/2017 1156 Last data filed at 03/31/2017 1048 Gross per 24 hour  Intake 600 ml  Output 700 ml  Net -100 ml   Filed Weights   03/30/17 0400  Weight: 65.3 kg (143  lb 15.4 oz)    Examination:   General: Not in pain or dyspnea, deconditioned Neurology: Awake and alert, continue right lower extremity paresis, with decrease strength on the right upper extremity, hand grip 3/5.  E ENT: mild pallor, no icterus, oral mucosa moist Cardiovascular: No  JVD. S1-S2 present, rhythmic, no gallops, rubs, or murmurs. No lower extremity edema. Pulmonary: vesicular breath sounds bilaterally, adequate air movement, no wheezing, rhonchi or rales. Gastrointestinal. Abdomen flat, no organomegaly, non tender, no rebound or guarding Skin. No rashes Musculoskeletal: Decreased right ankle edema. No erythema.      Data Reviewed: I have personally reviewed following labs and imaging studies  CBC: Recent Labs  Lab 03/29/17 2224 03/29/17 2243  WBC 14.7*  --   NEUTROABS 9.7*  --   HGB 11.6* 13.6  HCT 35.5* 40.0  MCV 84.9  --   PLT 258  --    Basic Metabolic Panel: Recent Labs  Lab 03/29/17 2224 03/29/17 2243 03/30/17 1001  NA 135 136 135  K 4.1 4.1 3.7  CL 101 102 101  CO2 22  --  25  GLUCOSE 269* 259* 279*  BUN 28* 33* 19  CREATININE 1.18* 1.00 0.84  CALCIUM 9.1  --  9.0   GFR: Estimated Creatinine Clearance: 47 mL/min (by C-G formula based on SCr of 0.84 mg/dL). Liver Function Tests: Recent Labs  Lab 03/29/17 2224 03/30/17 1001  AST 22 16  ALT 12* 10*  ALKPHOS 66 69  BILITOT 0.7 0.7  PROT 6.9 6.6  ALBUMIN 3.7 3.4*   No results for input(s): LIPASE, AMYLASE in the last 168 hours. No results for input(s): AMMONIA in the last 168 hours. Coagulation Profile: Recent Labs  Lab 03/29/17 2224  INR 1.12   Cardiac Enzymes: Recent Labs  Lab 03/30/17 0419 03/30/17 1001 03/30/17 2135  TROPONINI <0.03 <0.03 <0.03   BNP (last 3 results) No results for input(s): PROBNP in the last 8760 hours. HbA1C: No results for input(s): HGBA1C in the last 72 hours. CBG: Recent Labs  Lab 03/30/17 2055 03/30/17 2348 03/31/17 0339 03/31/17 0753 03/31/17 1127  GLUCAP 228* 176* 169* 331* 208*   Lipid Profile: Recent Labs    03/31/17 0259  CHOL 102  HDL 27*  LDLCALC 57  TRIG 92  CHOLHDL 3.8   Thyroid Function Tests: No results for input(s): TSH, T4TOTAL, FREET4, T3FREE, THYROIDAB in the last 72 hours. Anemia Panel: No  results for input(s): VITAMINB12, FOLATE, FERRITIN, TIBC, IRON, RETICCTPCT in the last 72 hours.    Radiology Studies: I have reviewed all of the imaging during this hospital visit personally     Scheduled Meds: . apixaban  5 mg Oral BID  . aspirin EC  81 mg Oral Daily  . atorvastatin  40 mg Oral Daily  . carvedilol  6.25 mg Oral BID WC  . diclofenac sodium  2 g Topical QID  . DULoxetine  30 mg Oral BID  . insulin aspart  0-9 Units Subcutaneous Q4H  . irbesartan  150 mg Oral Daily  . levothyroxine  50 mcg Oral QAC breakfast  . pantoprazole  40 mg Oral BID  . pregabalin  150 mg Oral Daily   Continuous Infusions:   LOS: 1 day        Theta Leaf Annett Gula, MD [facility] Pager [phone]

## 2017-04-01 LAB — GLUCOSE, CAPILLARY
Glucose-Capillary: 191 mg/dL — ABNORMAL HIGH (ref 65–99)
Glucose-Capillary: 231 mg/dL — ABNORMAL HIGH (ref 65–99)
Glucose-Capillary: 310 mg/dL — ABNORMAL HIGH (ref 65–99)
Glucose-Capillary: 357 mg/dL — ABNORMAL HIGH (ref 65–99)
Glucose-Capillary: 381 mg/dL — ABNORMAL HIGH (ref 65–99)
Glucose-Capillary: 383 mg/dL — ABNORMAL HIGH (ref 65–99)

## 2017-04-01 LAB — URINE CULTURE: Culture: 70000 — AB

## 2017-04-01 LAB — HEMOGLOBIN A1C
Hgb A1c MFr Bld: 8.6 % — ABNORMAL HIGH (ref 4.8–5.6)
Mean Plasma Glucose: 200 mg/dL

## 2017-04-01 MED ORDER — AMLODIPINE BESYLATE 5 MG PO TABS
5.0000 mg | ORAL_TABLET | Freq: Every day | ORAL | Status: DC
  Administered 2017-04-01 – 2017-04-03 (×3): 5 mg via ORAL
  Filled 2017-04-01 (×3): qty 1

## 2017-04-01 MED ORDER — INSULIN DETEMIR 100 UNIT/ML ~~LOC~~ SOLN
10.0000 [IU] | Freq: Two times a day (BID) | SUBCUTANEOUS | Status: DC
  Administered 2017-04-01 – 2017-04-02 (×2): 10 [IU] via SUBCUTANEOUS
  Filled 2017-04-01 (×2): qty 0.1

## 2017-04-01 MED ORDER — INSULIN ASPART 100 UNIT/ML ~~LOC~~ SOLN
0.0000 [IU] | Freq: Three times a day (TID) | SUBCUTANEOUS | Status: DC
  Administered 2017-04-01: 7 [IU] via SUBCUTANEOUS
  Administered 2017-04-01: 9 [IU] via SUBCUTANEOUS
  Administered 2017-04-02: 3 [IU] via SUBCUTANEOUS
  Administered 2017-04-02 (×2): 7 [IU] via SUBCUTANEOUS
  Administered 2017-04-03: 3 [IU] via SUBCUTANEOUS
  Administered 2017-04-03: 7 [IU] via SUBCUTANEOUS

## 2017-04-01 NOTE — Social Work (Signed)
CSW met with patient and family at bedside. Patient/family wants patient to go to CIR vs. SNF.  CSW will await outcome of CIR before moving further with SNF placement.  FL2 completed.  CSW will send out to SNF's if denied by CIR.  CSW will continue to follow.  Elissa Hefty, LCSW Clinical Social Worker [phone]

## 2017-04-01 NOTE — Clinical Social Work Note (Signed)
Clinical Social Work Assessment  Patient Details  Name: Casey Harrell MRN: [id number] Date of Birth: 1934-07-13  Date of referral:  04/01/17               Reason for consult:  Facility Placement                Permission sought to share information with:  Chartered certified accountant granted to share information::  Yes, Verbal Permission Granted  Name::     Midwife::  SNF vs CIR  Relationship::  spouse  Contact Information:     Housing/Transportation Living arrangements for the past 2 months:  Single Family Home Source of Information:  Patient, Adult Children, Spouse Patient Interpreter Needed:  None Criminal Activity/Legal Involvement Pertinent to Current Situation/Hospitalization:  No - Comment as needed Significant Relationships:  Adult Children, Spouse, Other Family Members Lives with:  Spouse Do you feel safe going back to the place where you live?  No Need for family participation in patient care:  Yes (Comment)  Care giving concerns:  Pt resided at home with spouse prior to hospitalization and ambulated with walker/cane and wheelchair.  Pt confirmed that spouse and daughter assisted with some ADL's. Pt admitted for Stroke and is a candidate for CIR.   CSW met with patient has a back up for CIR.    Social Worker assessment / plan:  CSW discussed role of CSW, SNF options/placment, and CIR. Pt indicated that doctor indicated that she should go to CIR very soon. Family wanted to wait to see the outcome of CIR before agreeing to SNF placement.  CSW will continue to follow up and await the outcome of CIR to determine additional SNF placement options.  Employment status:  Retired Nurse, adult PT Recommendations:  Inpatient [place] / Referral to community resources:  [place], [place]  Patient/Family's Response to care:  Psychologist, prison and probation services of CSW assistance and thanked CSW for f/u.  No other issues or concerns identified.  Patient/Family's Understanding of and Emotional Response to Diagnosis, Current Treatment, and Prognosis:  Patient/family has good understanding of diagnosis, current treatment and prognosis and understand that patient will need short term rehab.  Patient/family hopeful that patient will get into CIR vs. SNF. CSW will hold off on sending out to SNF's at this time as we await outcome of CIR. No other issues or concerns at this time.  Emotional Assessment Appearance:  Appears stated age Attitude/Demeanor/Rapport:  (Cooperative and Pleasant) Affect (typically observed):  Accepting, Appropriate Orientation:  Oriented to Self, Oriented to Place, Oriented to Situation Alcohol / Substance use:  Not Applicable Psych involvement (Current and /or in the community):  No (Comment)  Discharge Needs  Concerns to be addressed:  Care Coordination Readmission within the last 30 days:  No Current discharge risk:  Physical Impairment, Cognitively Impaired Barriers to Discharge:  No Barriers Identified   Normajean Baxter, LCSW 04/01/2017, 1:55 PM

## 2017-04-01 NOTE — Discharge Instructions (Addendum)

## 2017-04-01 NOTE — Progress Notes (Signed)
Rehab Admissions Coordinator Note:  Patient was screened by Trish MageLogue, Reid Nawrot M for appropriateness for an Inpatient Acute Rehab Consult.  At this time, we are recommending Inpatient Rehab consult.  Trish MageLogue, Macauley Mossberg M 04/01/2017, 6:30 PM  I can be reached at [phone].

## 2017-04-01 NOTE — [place] FL2 (Signed)
[place] MEDICAID FL2 LEVEL OF CARE SCREENING TOOL     IDENTIFICATION  Patient Name: Casey Harrell Birthdate: 12/25/1934 Sex: female Admission Date (Current Location): 03/29/2017  [facility] and [place] Number:  Producer, television/film/video and Address:  [facility] [place], [address], [place], [place] [address]      Provider Number: [id number]  Attending Physician Name and Address:  Coralie Keens,*  Relative Name and Phone Number:  Jenique Berendsen, home, [phone]    Current Level of Care: Hospital Recommended Level of Care: Skilled Nursing Facility Prior Approval Number:    Date Approved/Denied:   PASRR Number: [id number]  Discharge Plan: Other (Comment)(CIR vs SNF)    Current Diagnoses: Patient Active Problem List   Diagnosis Date Noted  . Anemia 03/30/2017  . History of stroke   . Chest pain 02/09/2016  . BPPV (benign paroxysmal positional vertigo) 08/25/2015  . HLD (hyperlipidemia) 08/25/2015  . At high risk for falls 07/01/2015  . Vertigo 06/21/2015  . Paroxysmal atrial fibrillation (HCC) 06/11/2015  . Cerebral infarction due to stenosis of left middle cerebral artery (HCC) 05/20/2015  . Intracranial vascular stenosis 05/20/2015  . Type 2 diabetes mellitus with circulatory disorder (HCC) 05/20/2015  . Type 2 diabetes, uncontrolled, with neuropathy (HCC)   . Abdominal pain   . Partial small bowel obstruction (HCC)   . Benign essential HTN   . Small bowel obstruction (HCC) 05/07/2015  . Hemiplegia, unspecified affecting right dominant side (HCC) 04/08/2015  . Embolic stroke involving posterior cerebral artery (HCC) 04/05/2015  . Orthostatic hypotension   . Hypertensive urgency 04/01/2015  . Fever in adult 04/01/2015  . Acute encephalopathy 04/01/2015  . Confusion   . CVA (cerebral infarction)   . Posterior circulation stroke (HCC)   . Acute ischemic stroke (HCC) 03/31/2015  . Stroke (HCC) 03/31/2015  . CKD (chronic kidney disease)  stage 3, GFR 30-59 ml/min (HCC) 04/17/2014  . Gastroenteritis, non-infectious 04/11/2014  . Nausea vomiting and diarrhea 04/10/2014  . Hypokalemia 04/10/2014  . Dehydration 04/09/2014  . Bronchitis with airway obstruction (HCC) 02/03/2013  . Leukocytosis 02/03/2013  . Fever 02/03/2013  . Acute respiratory failure with hypoxia (HCC) 02/03/2013  . Depression 02/03/2013  . Hyponatremia 02/03/2013  . Obesity (BMI 30-39.9) 01/10/2013  . PVD (peripheral vascular disease)- S/P RCE 06/18/09 12/13/2012  . Sleep apnea- C-pap intol 12/13/2012  . Low risk cardiac nuclear stress test- 2009 and Feb 2011 12/13/2012  . Mild persistent asthma 11/29/2011  . Chronic cough 06/05/2011  . Diabetic neuropathy (HCC) 09/02/2009  . Dyslipidemia 07/13/2009  . Hypothyroidism 11/12/2008  . Type 2 diabetes mellitus, uncontrolled (HCC) 07/15/2008  . Essential hypertension 07/15/2008  . GERD 07/15/2008  . DIVERTICULOSIS, COLON 07/15/2008    Orientation RESPIRATION BLADDER Height & Weight     Situation, Place, Self  Normal Continent Weight: 143 lb 15.4 oz (65.3 kg) Height:  5\' 1"  (154.9 cm)  BEHAVIORAL SYMPTOMS/MOOD NEUROLOGICAL BOWEL NUTRITION STATUS      Continent Diet(See DC Summary)  AMBULATORY STATUS COMMUNICATION OF NEEDS Skin   Extensive Assist Verbally Normal                       Personal Care Assistance Level of Assistance  Bathing, Feeding, Dressing Bathing Assistance: Maximum assistance Feeding assistance: Limited assistance Dressing Assistance: Maximum assistance     Functional Limitations Info  Sight, Hearing, Speech Sight Info: Adequate Hearing Info: Adequate Speech Info: Adequate    SPECIAL CARE FACTORS FREQUENCY  PT (By licensed PT), OT (By licensed OT)     PT Frequency: 3x week OT Frequency: 3x week            Contractures Contractures Info: Not present    Additional Factors Info  Code Status, Allergies, Psychotropic, Insulin Sliding Scale Code Status Info: Full  Code Allergies Info: DOXYCYCLINE HYCLATE, MORPHINE SULFATE, IOHEXOL, MOXIFLOXACIN  Psychotropic Info: Cymbalta Insulin Sliding Scale Info: Insulin Daily       Current Medications (04/01/2017):  This is the current hospital active medication list Current Facility-Administered Medications  Medication Dose Route Frequency Provider Last Rate Last Dose  . acetaminophen (TYLENOL) tablet 650 mg  650 mg Oral Q4H PRN Pearson Grippe, MD   650 mg at 04/01/17 9604   Or  . acetaminophen (TYLENOL) solution 650 mg  650 mg Per Tube Q4H PRN Pearson Grippe, MD       Or  . acetaminophen (TYLENOL) suppository 650 mg  650 mg Rectal Q4H PRN Pearson Grippe, MD      . amLODipine (NORVASC) tablet 5 mg  5 mg Oral Daily Opyd, Lavone Neri, MD   5 mg at 04/01/17 0914  . apixaban (ELIQUIS) tablet 5 mg  5 mg Oral BID Stevphen Rochester, RPH   5 mg at 04/01/17 0915  . aspirin EC tablet 81 mg  81 mg Oral Daily Marvel Plan, MD   81 mg at 04/01/17 0915  . atorvastatin (LIPITOR) tablet 40 mg  40 mg Oral Daily Pearson Grippe, MD   40 mg at 04/01/17 0914  . carvedilol (COREG) tablet 6.25 mg  6.25 mg Oral BID WC Pearson Grippe, MD   6.25 mg at 04/01/17 0913  . diclofenac sodium (VOLTAREN) 1 % transdermal gel 2 g  2 g Topical QID Arrien, York Ram, MD   2 g at 04/01/17 0915  . DULoxetine (CYMBALTA) DR capsule 30 mg  30 mg Oral BID Pearson Grippe, MD   30 mg at 04/01/17 0914  . insulin aspart (novoLOG) injection 0-9 Units  0-9 Units Subcutaneous TID WC Arrien, York Ram, MD   9 Units at 04/01/17 1135  . insulin detemir (LEVEMIR) injection 10 Units  10 Units Subcutaneous BID Arrien, York Ram, MD      . ipratropium-albuterol (DUONEB) 0.5-2.5 (3) MG/3ML nebulizer solution 3 mL  3 mL Nebulization Q6H PRN Pearson Grippe, MD      . irbesartan Evlyn Kanner) tablet 150 mg  150 mg Oral Daily Pearson Grippe, MD   150 mg at 04/01/17 0915  . levothyroxine (SYNTHROID, LEVOTHROID) tablet 50 mcg  50 mcg Oral QAC breakfast Pearson Grippe, MD   50 mcg at 04/01/17 [phone]  .  meclizine (ANTIVERT) tablet 25 mg  25 mg Oral TID PRN Pearson Grippe, MD      . ondansetron [facility]) tablet 4 mg  4 mg Oral Q8H PRN Pearson Grippe, MD      . pantoprazole (PROTONIX) EC tablet 40 mg  40 mg Oral BID Pearson Grippe, MD   40 mg at 04/01/17 0914  . pregabalin (LYRICA) capsule 150 mg  150 mg Oral Daily Pearson Grippe, MD   150 mg at 04/01/17 0915     Discharge Medications: Please see discharge summary for a list of discharge medications.  Relevant Imaging Results:  Relevant Lab Results:   Additional Information SS#: [id number]  Tresa Moore, LCSW

## 2017-04-01 NOTE — Progress Notes (Signed)
PROGRESS NOTE    Atilano MedianKay F Orbach  WJX:[id number] DOB: 1935/04/20 DOA: 03/29/2017 PCP: Kristian CoveyBurchette, Bruce W, MD    Brief Narrative:  82 year old female who presents with weakness. Patient does have the significant past medical history for hypertension, dyslipidemia, type 2 diabetes mellitus, and paroxysmal atrial fibrillation with a prior CVA. She presents with 24-hour history of weakness in her right lower extremity, associated with difficulty speaking, and dysphagia. On the initial physical examination blood pressure 130/58, heart rate 82, temperature 98.2, respiratory 23, oxygen saturation 95%. Moist mucous membranes, lungs clear to auscultation bilaterally, heart S1-S2 present rhythmic, abdomen soft nontender, no lower extremity edema. Sodium 136, potassium 4.1, chloride 102, bicarbonate 22, glucose 259, BUN 33, creatinine 1.0, white count 14.7, hemoglobin 11.6, hematocrit 35.5, platelets 258, urinalysis negative for infection, urine drug screen negative. Head CT with no acute intracranial changes. Chronic atrophy and small vessel ischemic changes, multifocal old infarcts. Chest x-ray, hypoinflated, left rotation, no infiltrates.   Patient was admitted to the hospital with the working diagnosis of focal neurologic deficit to rule out CVA.  Assessment & Plan:   Principal Problem:   Stroke [facility](HCC) Active Problems:   Hypothyroidism   Type 2 diabetes mellitus, uncontrolled (HCC)   Paroxysmal atrial fibrillation (HCC)   Anemia   History of stroke   1. Acute ischemic CVA, small acute infarct, off the left pons, 8 x 3 mm. patient with persistent right hemiparesis, will continue aspirin and apixaban for anticoagulation, plus statin therapy. Patient is medically stable for inpatient rehab.    2. Paroxysmal atrial fibrillation. Rate control with carvedilol, continue anticoagulation with apixaban and aspirin.   4. HTN. On irbesartan and carvedilol, systolic blood pressure 160 to 190 mmHg, will add  low dose amlodipine for better blood pressure control.   3. Type 2 diabetes mellitus. Persistent ncontrolled hyperglycemia, capillary glucose 347, 231, 191, 381, 357 will increase long acting insulin with levimir 10 units to bid and will continue glucose cover and monitoring with insulin sliding scale.  4. Hypothyroidism. Will continue with levothyroxine.  5. Anemia. Hb and hct stable. No indication for prbc transfusion  6. Depression. On duloxetine with good toleration.   7. Right ankle edema. Continue topical analgesia with voltaren gel, out of bed as tolerated.   DVT prophylaxis:apixaban Code Status:full Family Communication:I spoke with patient's family at the bedside and all questions were addressed. Disposition Plan:rehab.   Consultants:  Neurology  Procedures:    Antimicrobials:     Subjective: Persistent weakness on right lower extremity, with positive right ankle pain improved with topical analgesics, no nausea or vomiting. Persistent hyperglycemia.   Objective: Vitals:   04/01/17 0030 04/01/17 0532 04/01/17 0643 04/01/17 0900  BP: (!) 177/68 (!) 197/75 (!) 193/78 (!) 168/70  Pulse: 69 71 66 65  Resp: 19 18 18 20   Temp: 98.3 F (36.8 C) 97.7 F (36.5 C)  97.7 F (36.5 C)  TempSrc: Oral Oral  Oral  SpO2: 94% 97%  92%  Weight:      Height:        Intake/Output Summary (Last 24 hours) at 04/01/2017 1125 Last data filed at 04/01/2017 0900 Gross per 24 hour  Intake 240 ml  Output 350 ml  Net -110 ml   Filed Weights   03/30/17 0400  Weight: 65.3 kg (143 lb 15.4 oz)    Examination:   General: Not in pain or dyspnea, deconditioned Neurology: Awake and alert, non focal  E ENT: mild pallor, no icterus, oral mucosa moist  Cardiovascular: No JVD. S1-S2 present, rhythmic, no gallops, rubs, or murmurs. No lower extremity edema. Pulmonary: decreased breath sounds bilaterally, adequate air movement, no wheezing, rhonchi or  rales. Gastrointestinal. Abdomen flat, no organomegaly, non tender, no rebound or guarding Skin. No rashes Musculoskeletal: no joint deformities     Data Reviewed: I have personally reviewed following labs and imaging studies  CBC: Recent Labs  Lab 03/29/17 2224 03/29/17 2243  WBC 14.7*  --   NEUTROABS 9.7*  --   HGB 11.6* 13.6  HCT 35.5* 40.0  MCV 84.9  --   PLT 258  --    Basic Metabolic Panel: Recent Labs  Lab 03/29/17 2224 03/29/17 2243 03/30/17 1001  NA 135 136 135  K 4.1 4.1 3.7  CL 101 102 101  CO2 22  --  25  GLUCOSE 269* 259* 279*  BUN 28* 33* 19  CREATININE 1.18* 1.00 0.84  CALCIUM 9.1  --  9.0   GFR: Estimated Creatinine Clearance: 47 mL/min (by C-G formula based on SCr of 0.84 mg/dL). Liver Function Tests: Recent Labs  Lab 03/29/17 2224 03/30/17 1001  AST 22 16  ALT 12* 10*  ALKPHOS 66 69  BILITOT 0.7 0.7  PROT 6.9 6.6  ALBUMIN 3.7 3.4*   No results for input(s): LIPASE, AMYLASE in the last 168 hours. No results for input(s): AMMONIA in the last 168 hours. Coagulation Profile: Recent Labs  Lab 03/29/17 2224  INR 1.12   Cardiac Enzymes: Recent Labs  Lab 03/30/17 0419 03/30/17 1001 03/30/17 2135  TROPONINI <0.03 <0.03 <0.03   BNP (last 3 results) No results for input(s): PROBNP in the last 8760 hours. HbA1C: Recent Labs    03/31/17 0259  HGBA1C 8.6*   CBG: Recent Labs  Lab 03/31/17 1629 03/31/17 2045 04/01/17 0029 04/01/17 0406 04/01/17 0920  GLUCAP 195* 347* 231* 191* 381*   Lipid Profile: Recent Labs    03/31/17 0259  CHOL 102  HDL 27*  LDLCALC 57  TRIG 92  CHOLHDL 3.8   Thyroid Function Tests: No results for input(s): TSH, T4TOTAL, FREET4, T3FREE, THYROIDAB in the last 72 hours. Anemia Panel: No results for input(s): VITAMINB12, FOLATE, FERRITIN, TIBC, IRON, RETICCTPCT in the last 72 hours.    Radiology Studies: I have reviewed all of the imaging during this hospital visit personally     Scheduled  Meds: . amLODipine  5 mg Oral Daily  . apixaban  5 mg Oral BID  . aspirin EC  81 mg Oral Daily  . atorvastatin  40 mg Oral Daily  . carvedilol  6.25 mg Oral BID WC  . diclofenac sodium  2 g Topical QID  . DULoxetine  30 mg Oral BID  . insulin aspart  0-9 Units Subcutaneous TID WC  . insulin detemir  10 Units Subcutaneous Daily  . irbesartan  150 mg Oral Daily  . levothyroxine  50 mcg Oral QAC breakfast  . pantoprazole  40 mg Oral BID  . pregabalin  150 mg Oral Daily   Continuous Infusions:   LOS: 2 days        Mauricio Annett Gulaaniel Arrien, MD [facility] Pager [phone]

## 2017-04-02 DIAGNOSIS — I6930 Unspecified sequelae of cerebral infarction: Secondary | ICD-10-CM

## 2017-04-02 DIAGNOSIS — E039 Hypothyroidism, unspecified: Secondary | ICD-10-CM

## 2017-04-02 DIAGNOSIS — E1142 Type 2 diabetes mellitus with diabetic polyneuropathy: Secondary | ICD-10-CM

## 2017-04-02 DIAGNOSIS — I10 Essential (primary) hypertension: Secondary | ICD-10-CM

## 2017-04-02 DIAGNOSIS — E119 Type 2 diabetes mellitus without complications: Secondary | ICD-10-CM

## 2017-04-02 DIAGNOSIS — E785 Hyperlipidemia, unspecified: Secondary | ICD-10-CM

## 2017-04-02 DIAGNOSIS — I6302 Cerebral infarction due to thrombosis of basilar artery: Secondary | ICD-10-CM

## 2017-04-02 DIAGNOSIS — F329 Major depressive disorder, single episode, unspecified: Secondary | ICD-10-CM

## 2017-04-02 DIAGNOSIS — I480 Paroxysmal atrial fibrillation: Secondary | ICD-10-CM

## 2017-04-02 LAB — GLUCOSE, CAPILLARY
Glucose-Capillary: 233 mg/dL — ABNORMAL HIGH (ref 65–99)
Glucose-Capillary: 339 mg/dL — ABNORMAL HIGH (ref 65–99)
Glucose-Capillary: 303 mg/dL — ABNORMAL HIGH (ref 65–99)
Glucose-Capillary: 266 mg/dL — ABNORMAL HIGH (ref 65–99)

## 2017-04-02 MED ORDER — INSULIN DETEMIR 100 UNIT/ML ~~LOC~~ SOLN
20.0000 [IU] | Freq: Two times a day (BID) | SUBCUTANEOUS | Status: DC
  Administered 2017-04-02 – 2017-04-03 (×2): 20 [IU] via SUBCUTANEOUS
  Filled 2017-04-02 (×2): qty 0.2

## 2017-04-02 MED ORDER — INSULIN DETEMIR 100 UNIT/ML ~~LOC~~ SOLN
10.0000 [IU] | Freq: Once | SUBCUTANEOUS | Status: AC
  Administered 2017-04-02: 10 [IU] via SUBCUTANEOUS
  Filled 2017-04-02: qty 0.1

## 2017-04-02 NOTE — Progress Notes (Signed)
Physical Therapy Treatment Patient Details Name: Casey Harrell MRN: [id number] DOB: [birth year] Today's Date: 04/02/2017    History of Present Illness 82 y.o. female white female with past medical history of CVA with residual right hemiparesis, diabetes, hypertension, COPD, atrial fibrillation on Eliquis, peripheral vascular disease presents with right-sided weakness and her speech since 8 AM this morning.Was admitted to [facility] 03/31/2015 for agitation and increased confusion.  MRI showed small patchy left MCA infarcts with remote bilateral occipital infarcts, embolic pattern.  Workup during that admission revealed the patient also had intracranial atherosclerotic disease and was placed on dual antiplatelets.     PT Comments    Pt able to tolerate ambulation this date with maxAx2. Pt remains to requires maxA for all mobility. Pt with noted L field cut, difficulty tracking to the L, R sided weakness and R ankle pain limiting R LE WBING tolerance. Pt no longer with c/o dizziness with mobility. Acute PT to con't to follow.   Follow Up Recommendations  CIR     Equipment Recommendations  Other (comment)    Recommendations for Other Services OT consult     Precautions / Restrictions Precautions Precautions: Fall Precaution Comments: R ankle bruising Restrictions Weight Bearing Restrictions: No    Mobility  Bed Mobility Overal bed mobility: Needs Assistance Bed Mobility: Rolling;Sidelying to Sit Rolling: Min assist Sidelying to sit: Mod assist       General bed mobility comments: modA for trunk elevation, pt did initiate bilat LE off eob and reached for bed rail with L UE  Transfers Overall transfer level: Needs assistance Equipment used: 2 person hand held assist Transfers: Sit to/from Stand Sit to Stand: Max assist;+2 physical assistance         General transfer comment: blocked knees, tactile cues at posterior hip, pt with retropulsion and limited R LE  weightbearing due to pain and weakness  Ambulation/Gait Ambulation/Gait assistance: Max assist;+2 physical assistance;+2 safety/equipment Ambulation Distance (Feet): 10 Feet Assistive device: Rolling walker (2 wheeled) Gait Pattern/deviations: Step-to pattern;Decreased stride length;Decreased dorsiflexion - right;Decreased weight shift to right;Decreased weight shift to left;Antalgic Gait velocity: slow   General Gait Details: maxA to maintain upright, pt with strong R lateral lean. several standing rest breaks, several episodes of R knee buckling   Stairs            Wheelchair Mobility    Modified Rankin (Stroke Patients Only) Modified Rankin (Stroke Patients Only) Pre-Morbid Rankin Score: No symptoms Modified Rankin: Severe disability     Balance Overall balance assessment: Needs assistance Sitting-balance support: Feet supported;Single extremity supported Sitting balance-Leahy Scale: Poor Sitting balance - Comments: pt with strong R lateral lean, modA to maintain midline posture, posterior lean with onset of fatigue Postural control: Right lateral lean   Standing balance-Leahy Scale: Zero Standing balance comment: max assist for standing balance                            Cognition Arousal/Alertness: Awake/alert Behavior During Therapy: WFL for tasks assessed/performed Overall Cognitive Status: Impaired/Different from baseline Area of Impairment: Attention;Problem solving                   Current Attention Level: Sustained         Problem Solving: Slow processing;Decreased initiation;Difficulty sequencing;Requires verbal cues;Requires tactile cues        Exercises      General Comments        Pertinent Vitals/Pain Pain  Assessment: Faces Faces Pain Scale: Hurts even more Pain Location: R foot Pain Descriptors / Indicators: Grimacing;Guarding Pain Intervention(s): Monitored during session    Home Living                       Prior Function            PT Goals (current goals can now be found in the care plan section) Progress towards PT goals: Progressing toward goals    Frequency    Min 3X/week      PT Plan Current plan remains appropriate    Co-evaluation              AM-PAC PT "6 Clicks" Daily Activity  Outcome Measure  Difficulty turning over in bed (including adjusting bedclothes, sheets and blankets)?: Unable Difficulty moving from lying on back to sitting on the side of the bed? : Unable Difficulty sitting down on and standing up from a chair with arms (e.g., wheelchair, bedside commode, etc,.)?: Unable Help needed moving to and from a bed to chair (including a wheelchair)?: Total Help needed walking in hospital room?: Total Help needed climbing 3-5 steps with a railing? : Total 6 Click Score: 6    End of Session Equipment Utilized During Treatment: Gait belt Activity Tolerance: Patient tolerated treatment well Patient left: in chair;with call bell/phone within reach;with chair alarm set Nurse Communication: Mobility status PT Visit Diagnosis: Hemiplegia and hemiparesis Hemiplegia - Right/Left: Right Hemiplegia - dominant/non-dominant: Dominant Hemiplegia - caused by: Cerebral infarction Pain - Right/Left: Right Pain - part of body: Shoulder;Arm;Knee;Ankle and joints of foot     Time: 6440-3474 PT Time Calculation (min) (ACUTE ONLY): 29 min  Charges:  $Gait Training: 8-22 mins $Neuromuscular Re-education: 8-22 mins                    G Codes:       Lewis Shock, PT, DPT Pager #: [phone] Office #: [phone]    Dnasia Gauna M Tallis Soledad 04/02/2017, 2:56 PM

## 2017-04-02 NOTE — Progress Notes (Signed)
PROGRESS NOTE    Casey Harrell  ZOX:[id number] DOB: [birth year] DOA: 03/29/2017 PCP: [name], MD    Brief Narrative:  82 year old female who presents with weakness. Patient does have the significant past medical history for hypertension, dyslipidemia, type 2 diabetes mellitus, and paroxysmal atrial fibrillation with a prior CVA. She presents with 24-hour history of weakness in her right lower extremity, associated with difficulty speaking, and dysphagia. On the initial physical examination blood pressure 130/58, heart rate 82, temperature 98.2, respiratory 23, oxygen saturation 95%. Moist mucous membranes, lungs clear to auscultation bilaterally, heart S1-S2 present rhythmic, abdomen soft nontender, no lower extremity edema. Sodium 136, potassium 4.1, chloride 102, bicarbonate 22, glucose 259, BUN 33, creatinine 1.0, white count 14.7, hemoglobin 11.6, hematocrit 35.5, platelets 258, urinalysis negative for infection, urine drug screen negative. Head CT with no acute intracranial changes. Chronic atrophy and small vessel ischemic changes, multifocal old infarcts. Chest x-ray, hypoinflated, left rotation, no infiltrates.   Patient was admitted to the hospital with the working diagnosis of focal neurologic deficit to rule out CVA.   Assessment & Plan:   Principal Problem:   Stroke [facility]) Active Problems:   Hypothyroidism   Type 2 diabetes mellitus, uncontrolled (HCC)   Paroxysmal atrial fibrillation (HCC)   Anemia   History of stroke   1. Acute ischemic CVA, small acute infarct, offthe left pons, 8 x 3 mm.  Patient with persistent right hemiparesis. Medical therapy with aspirin - apixaban - statin. Pending evaluation for inpatient rehab.   2. Paroxysmal atrial fibrillation. On carvedilol, anticoagulation with apixaban andaspirin.  4. HTN.Blood pressure controlirbesartan, carvedilol, and amlodipine. Blood pressure systolic 138 to 142. Due to CVA target outpatient blood  pressure less than 130 systolic,    3. Type 2 diabetes mellitus.Capillary glucose still in the 200 and 300, will increase basal insulin to 20 units bid, at home she is no 30 units bid, will continue insulin sliding scale for glucose cover and monitoring.  4. Hypothyroidism.Onlevothyroxine.  5. Anemia.Stable, with no signs of bleeding.   6. Depression.Continueduloxetine with good toleration.   7. Right ankle edema.Topical analgesia with voltaren gel.  DVT prophylaxis:apixaban Code Status:full Family Communication:No family at the bedside.  Disposition Plan:rehab.   Consultants:  Neurology  Procedures:    Antimicrobials:      Subjective: Patient continue to have right ankle pain and right lower extremity weakness, no nausea or vomiting, no chest pain or dyspnea.   Objective: Vitals:   04/02/17 0046 04/02/17 0634 04/02/17 0810 04/02/17 0941  BP: (!) 180/64 (!) 178/97 (!) 181/85 (!) 138/58  Pulse: 69 82 65 72  Resp: 16 18  20   Temp: 98.3 F (36.8 C) 98.3 F (36.8 C)  98.3 F (36.8 C)  TempSrc: Oral Oral  Oral  SpO2: 93% 95%  96%  Weight:      Height:        Intake/Output Summary (Last 24 hours) at 04/02/2017 1142 Last data filed at 04/01/2017 1332 Gross per 24 hour  Intake 240 ml  Output -  Net 240 ml   Filed Weights   03/30/17 0400  Weight: 65.3 kg (143 lb 15.4 oz)    Examination:   General: Not in pain or dyspnea, deconditioned Neurology: Awake and alert, decrease strength on the right upper extremity, right lower extremity not able to move against gravity.  E ENT: mild pallor, no icterus, oral mucosa moist Cardiovascular: No JVD. S1-S2 present, rhythmic, no gallops, rubs, or murmurs. No lower extremity edema. Pulmonary:  vesicular breath sounds bilaterally, adequate air movement, no wheezing, rhonchi or rales. Gastrointestinal. Abdomen flat, no organomegaly, non tender, no rebound or guarding Skin. No  rashes Musculoskeletal: improved right ankle edema.      Data Reviewed: I have personally reviewed following labs and imaging studies  CBC: Recent Labs  Lab 03/29/17 2224 03/29/17 2243  WBC 14.7*  --   NEUTROABS 9.7*  --   HGB 11.6* 13.6  HCT 35.5* 40.0  MCV 84.9  --   PLT 258  --    Basic Metabolic Panel: Recent Labs  Lab 03/29/17 2224 03/29/17 2243 03/30/17 1001  NA 135 136 135  K 4.1 4.1 3.7  CL 101 102 101  CO2 22  --  25  GLUCOSE 269* 259* 279*  BUN 28* 33* 19  CREATININE 1.18* 1.00 0.84  CALCIUM 9.1  --  9.0   GFR: Estimated Creatinine Clearance: 47 mL/min (by C-G formula based on SCr of 0.84 mg/dL). Liver Function Tests: Recent Labs  Lab 03/29/17 2224 03/30/17 1001  AST 22 16  ALT 12* 10*  ALKPHOS 66 69  BILITOT 0.7 0.7  PROT 6.9 6.6  ALBUMIN 3.7 3.4*   No results for input(s): LIPASE, AMYLASE in the last 168 hours. No results for input(s): AMMONIA in the last 168 hours. Coagulation Profile: Recent Labs  Lab 03/29/17 2224  INR 1.12   Cardiac Enzymes: Recent Labs  Lab 03/30/17 0419 03/30/17 1001 03/30/17 2135  TROPONINI <0.03 <0.03 <0.03   BNP (last 3 results) No results for input(s): PROBNP in the last 8760 hours. HbA1C: Recent Labs    03/31/17 0259  HGBA1C 8.6*   CBG: Recent Labs  Lab 04/01/17 1128 04/01/17 1610 04/01/17 2104 04/02/17 0704 04/02/17 1138  GLUCAP 357* 310* 383* 233* 339*   Lipid Profile: Recent Labs    03/31/17 0259  CHOL 102  HDL 27*  LDLCALC 57  TRIG 92  CHOLHDL 3.8   Thyroid Function Tests: No results for input(s): TSH, T4TOTAL, FREET4, T3FREE, THYROIDAB in the last 72 hours. Anemia Panel: No results for input(s): VITAMINB12, FOLATE, FERRITIN, TIBC, IRON, RETICCTPCT in the last 72 hours.    Radiology Studies: I have reviewed all of the imaging during this hospital visit personally     Scheduled Meds: . amLODipine  5 mg Oral Daily  . apixaban  5 mg Oral BID  . aspirin EC  81 mg Oral  Daily  . atorvastatin  40 mg Oral Daily  . carvedilol  6.25 mg Oral BID WC  . diclofenac sodium  2 g Topical QID  . DULoxetine  30 mg Oral BID  . insulin aspart  0-9 Units Subcutaneous TID WC  . insulin detemir  10 Units Subcutaneous BID  . irbesartan  150 mg Oral Daily  . levothyroxine  50 mcg Oral QAC breakfast  . pantoprazole  40 mg Oral BID  . pregabalin  150 mg Oral Daily   Continuous Infusions:   LOS: 3 days        Derrich Gaby Annett Gula, MD [facility] Pager [phone]

## 2017-04-02 NOTE — Progress Notes (Signed)
OT Cancellation    04/02/17 1300  OT Visit Information  Last OT Received On 04/02/17  Reason Eval/Treat Not Completed Patient at procedure or test/ unavailable (ECHO lab. Will return as schedule allows.)   Jessey Stehlin MSOT, OTR/L Acute Rehab Pager: [phone] Office: [phone]

## 2017-04-02 NOTE — Consult Note (Signed)
Physical Medicine and Rehabilitation Consult Reason for Consult: Right side weakness and slurred speech Referring Physician: [facility]   HPI: Casey Harrell is a 82 y.o. 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 Per chart review and patient, patient lives with spouse, who has cancer. Independent with assistive device using rolling walker. Did not need assist for ADLs. One level home with 6 steps to entry. Presented 03/30/2017 with increasing right sided weakness and difficulty with speech. Cranial CT scan reviewed, unremarkable for acute process. Per report, multifocal old infarcts. Patient did not receive TPA. Troponin negative. MRI showed small acute infarct of the left pons measuring approximately 8 x 3 mm. No associated hemorrhage. MRA motion degraded showed moderate to severe stenosis of the supraclinoid right ICA. No occlusion or high-grade stenosis of the intracranial arch vessels. Echocardiogram with ejection fraction of 60% no wall motion abnormalities. Neurology consulted with aspirin added in addition to Eliquis that patient was on prior to admission. Tolerating a regular diet. Physical and occupational therapy evaluations completed with recommendations of physical medicine rehabilitation consult.   Review of Systems  Constitutional: Negative for chills and fever.  HENT: Negative for hearing loss.   Eyes: Negative for blurred vision and double vision.  Respiratory: Positive for shortness of breath.   Cardiovascular: Negative for chest pain and leg swelling.  Gastrointestinal: Positive for constipation. Negative for nausea.       GERD  Genitourinary: Negative for dysuria, flank pain and hematuria.  Musculoskeletal:  Positive for joint pain and myalgias.  Skin: Negative for rash.  Neurological: Positive for dizziness, speech change and focal weakness.  All other systems reviewed and are negative.  Past Medical History:  Diagnosis Date  . ASTHMA 07/15/2008  . COPD (chronic obstructive pulmonary disease) (HCC)   . DIABETES-TYPE 2 07/15/2008  . DIABETIC PERIPHERAL NEUROPATHY 09/02/2009  . GERD 07/15/2008  . HYPERTENSION 07/15/2008  . Normal cardiac stress test    low risk Nuc 2009, Feb 2011  . Paroxysmal atrial fibrillation (HCC)   . PVD (peripheral vascular disease) (HCC) 06/18/09   RCE  . Stroke (HCC)   . Vertigo    Past Surgical History:  Procedure Laterality Date  . ABDOMINAL HYSTERECTOMY  1971  . APPENDECTOMY  1971  . CAROTID ENDARTERECTOMY  06/18/09   RCE  . CESAREAN SECTION    . colectomy and colostomy    . FLEXIBLE SIGMOIDOSCOPY     diverticulitis  . FOOT SURGERY     right foot X 2  . LAPAROTOMY     X 3 with adhesions lysis  . TONSILLECTOMY    . TUBAL LIGATION     Family History  Problem Relation Age of Onset  . Diabetes Mother   . Arthritis Mother   . Heart disease Mother   . Heart disease Father   . Heart disease Sister   . Heart disease Brother   . Cancer Brother 60       lung cancer  . Celiac disease Sister   . Stroke Maternal Grandfather   . Cancer Paternal Grandfather        lung  . Heart disease Sister   . Asthma Sister    Social History:  reports that  has never smoked. she has never used smokeless tobacco. She reports  that she does not drink alcohol or use drugs. Allergies:  Allergies  Allergen Reactions  . Doxycycline Hyclate Nausea And Vomiting  . Morphine Sulfate Other (See Comments)    REACTION: hallucinations  . Iohexol Hives     Code: HIVES, Desc: pt. states she breaks out in hives 06/15/08   . Moxifloxacin Other (See Comments)    REACTION: unknown   Medications Prior to Admission  Medication Sig Dispense Refill  . apixaban (ELIQUIS) 5 MG TABS  tablet TAKE 1 TABLET(5 MG) BY MOUTH TWICE DAILY 60 tablet 11  . atorvastatin (LIPITOR) 40 MG tablet TAKE 1 TABLET BY MOUTH EVERY DAY 90 tablet 3  . carvedilol (COREG) 6.25 MG tablet TAKE 1 TABLET BY MOUTH TWICE A DAY WITH A MEAL 180 tablet 1  . irbesartan (AVAPRO) 150 MG tablet TAKE 1 TABLET BY MOUTH EVERY DAY 90 tablet 1  . LEVEMIR FLEXTOUCH 100 UNIT/ML Pen INJECT 40 UNITS INTO THE SKIN TWICE A DAY (Patient taking differently: INJECT 30 UNITS INTO THE SKIN TWICE A DAY) 3 mL 5  . levothyroxine (SYNTHROID, LEVOTHROID) 50 MCG tablet TAKE 1 TABLET BY MOUTH EVERY DAY BEFORE BREAKFAST 90 tablet 1  . LYRICA 150 MG capsule TAKE ONE CAPSULE BY MOUTH EVERY DAY 90 capsule 1  . metFORMIN (GLUCOPHAGE) 1000 MG tablet TAKE 1 TABLET BY MOUTH TWICE A DAY WITH A MEAL 180 tablet 2  . pantoprazole (PROTONIX) 40 MG tablet TAKE 1 TABLET BY MOUTH TWICE A DAY 180 tablet 1  . acetaminophen (TYLENOL) 325 MG tablet Take 650 mg by mouth every 6 (six) hours as needed (pain).    Marland Kitchen. albuterol (ACCUNEB) 1.25 MG/3ML nebulizer solution Take 3 mLs (1.25 mg total) by nebulization every 4 (four) hours as needed for wheezing. Reported on 04/26/2015 75 mL 12  . CALCIUM PO Take 1 tablet by mouth daily.    . Cholecalciferol (VITAMIN D3) 2000 UNITS TABS Take 2,000 Units by mouth daily. Reported on 04/26/2015    . DULoxetine (CYMBALTA) 30 MG capsule TAKE 1 CAPSULE BY MOUTH EVERY DAY FOR 2 WEEKS, THEN INCREASE TO 1 CAPSULE TWICE A DAY (Patient taking differently: Take 1 capsule (30mg ) twice daily.) 180 capsule 1  . HUMALOG KWIKPEN 100 UNIT/ML KiwkPen INJECT 9-14 UNITS SUBCUTANEOUSLY TWICE DAILY WITH MEAL 9 UNITS BREAKFAST AND 14 UNITS AFTER SUPPER 45 mL 1  . ipratropium-albuterol (DUONEB) 0.5-2.5 (3) MG/3ML SOLN Take 3 mLs by nebulization every 6 (six) hours as needed (shortness of breath or wheezing). 360 mL 0  . meclizine (ANTIVERT) 25 MG tablet Take 1 tablet (25 mg total) by mouth 3 (three) times daily as needed for dizziness. 30 tablet 1    . ondansetron (ZOFRAN ODT) 4 MG disintegrating tablet Take 1 tablet (4 mg total) by mouth every 8 (eight) hours as needed for nausea or vomiting. 8 tablet 0    Home: Home Living Family/patient expects to be discharged to:: Private residence Living Arrangements: Spouse/significant other Available Help at Discharge: Family, Available 24 hours/day Type of Home: House Home Access: Stairs to enter Entergy CorporationEntrance Stairs-Number of Steps: 6 Entrance Stairs-Rails: Can reach both Home Layout: One level Bathroom Shower/Tub: Psychologist, counsellingWalk-in shower, Sport and exercise psychologistDoor Bathroom Toilet: Standard Bathroom Accessibility: Yes Home Equipment: Environmental consultantWalker - 4 wheels, Bedside commode, Shower seat - built in, Coventry Health Carerab bars - tub/shower, Hand held shower head  Functional History: Prior Function Level of Independence: Independent with assistive device(s) Comments: ambulated with RW, no assist with ADLs, iADLs  Functional Status:  Mobility: Bed Mobility Overal bed mobility: Needs  Assistance Bed Mobility: Supine to Sit Rolling: Min assist Supine to sit: Mod assist Sit to supine: Max assist General bed mobility comments: Cues for technique. HOB flat with use of bed rail. Assist for RLE to EOB and trunk elevation to sitting Transfers Overall transfer level: Needs assistance Transfers: Sit to/from Stand, Stand Pivot Transfers Sit to Stand: Max assist Stand pivot transfers: Max assist, +2 physical assistance General transfer comment: Blocked R knee and foot, cues for sequencing and safety. Pt able to take small pivotal steps toward L side.       ADL: ADL Overall ADL's : Needs assistance/impaired Eating/Feeding: Moderate assistance, Sitting Grooming: Moderate assistance, Sitting Upper Body Bathing: Maximal assistance, Sitting Lower Body Bathing: Maximal assistance, +2 for physical assistance, Sit to/from stand Upper Body Dressing : Maximal assistance, Sitting Lower Body Dressing: Maximal assistance, +2 for physical assistance, Sit  to/from stand Toilet Transfer: Maximal assistance, +2 for physical assistance, Stand-pivot, BSC Toilet Transfer Details (indicate cue type and reason): Simulated by transfer EOB>chair Functional mobility during ADLs: Maximal assistance, +2 for physical assistance(for stand pivot only) General ADL Comments: Discussed post acute rehab, pt agreeable. No c/o dizziness this session.  Cognition: Cognition Overall Cognitive Status: Impaired/Different from baseline Arousal/Alertness: Awake/alert Orientation Level: Oriented X4 Attention: Focused, Sustained Focused Attention: Appears intact Sustained Attention: Appears intact Memory: Impaired Memory Impairment: Decreased short term memory Decreased Short Term Memory: Verbal basic, Functional basic(pt did not recall OT having visited her this morning) Awareness: Appears intact Cognition Arousal/Alertness: Awake/alert Behavior During Therapy: WFL for tasks assessed/performed Overall Cognitive Status: Impaired/Different from baseline  Blood pressure (!) 180/64, pulse 69, temperature 98.3 F (36.8 C), temperature source Oral, resp. rate 16, height 5\' 1"  (1.549 m), weight 65.3 kg (143 lb 15.4 oz), SpO2 93 %. Physical Exam  Constitutional: She appears well-developed and well-nourished.  HENT:  Head: Normocephalic and atraumatic.  Eyes: EOM are normal. Right eye exhibits no discharge. Left eye exhibits no discharge.  Neck: Normal range of motion. Neck supple. No thyromegaly present.  Cardiovascular: Normal rate and regular rhythm.  Respiratory: Effort normal and breath sounds normal. No respiratory distress.  GI: Soft. Bowel sounds are normal. She exhibits no distension.  Musculoskeletal: She exhibits edema (Right ankle). She exhibits no tenderness.  Neurological: She is alert.  Makes eye contact with examiner.  Mild dysarthria.  Follow simple commands Motor: LUE: 5/5 proximal to distal LLE: 4+/5 proximal to distal RUE: 2-/5 proximal to  distal (baseline) RLE: 1/5 proximal to distal  Skin: Skin is warm and dry.  Psychiatric: She has a normal mood and affect. Her behavior is normal. Thought content normal.    Results for orders placed or performed during the hospital encounter of 03/29/17 (from the past 24 hour(s))  Glucose, capillary     Status: Abnormal   Collection Time: 04/01/17  9:20 AM  Result Value Ref Range   Glucose-Capillary 381 (H) 65 - 99 mg/dL   Comment 1 Notify RN    Comment 2 Document in Chart   Glucose, capillary     Status: Abnormal   Collection Time: 04/01/17 11:28 AM  Result Value Ref Range   Glucose-Capillary 357 (H) 65 - 99 mg/dL   Comment 1 Notify RN    Comment 2 Document in Chart   Glucose, capillary     Status: Abnormal   Collection Time: 04/01/17  4:10 PM  Result Value Ref Range   Glucose-Capillary 310 (H) 65 - 99 mg/dL   Comment 1 Notify RN  Comment 2 Document in Chart   Glucose, capillary     Status: Abnormal   Collection Time: 04/01/17  9:04 PM  Result Value Ref Range   Glucose-Capillary 383 (H) 65 - 99 mg/dL   Comment 1 Notify RN    Comment 2 Document in Chart    No results found.  Assessment/Plan: Diagnosis: small acute infarct of the left pons  Labs and images independently reviewed.  Records reviewed and summated above. Stroke: Continue secondary stroke prophylaxis and Risk Factor Modification listed below:   Antiplatelet therapy:   Blood Pressure Management:  Continue current medication with prn's with permisive HTN per primary team Statin Agent:   Diabetes management:   Right sided hemiparesis: fit for orthosis to prevent contractures (resting hand splint for day, wrist cock up splint at night, PRAFO, etc) Motor recovery: Fluoxetine  1. Does the need for close, 24 hr/day medical supervision in concert with the patient's rehab needs make it unreasonable for this patient to be served in a less intensive setting? Yes  2. Co-Morbidities requiring supervision/potential  complications: HTN (monitor and provide prns in accordance with increased physical exertion and pain), hyperlipidemia (cont meds), diabetes mellitus with neuropathy (Monitor in accordance with exercise and adjust meds as necessary), atrial fibrillation (cont meds, monitor HR with increased activity), history of CVA with residual deficits), depression (ensure mood does not hinder progress of therapies), hypothyroidism (cont meds, ensure appropriate mood and energy level for therapies) 3. Due to bladder management, bowel management, safety, disease management and patient education, does the patient require 24 hr/day rehab nursing? Yes 4. Does the patient require coordinated care of a physician, rehab nurse, PT (1-2 hrs/day, 5 days/week), OT (1-2 hrs/day, 5 days/week) and SLP (1-2 hrs/day, 5 days/week) to address physical and functional deficits in the context of the above medical diagnosis(es)? Yes Addressing deficits in the following areas: balance, endurance, locomotion, strength, transferring, bowel/bladder control, bathing, dressing, toileting and psychosocial support 5. Can the patient actively participate in an intensive therapy program of at least 3 hrs of therapy per day at least 5 days per week? Yes 6. The potential for patient to make measurable gains while on inpatient rehab is excellent 7. Anticipated functional outcomes upon discharge from inpatient rehab are min assist and mod assist  with PT, min assist and mod assist with OT, n/a with SLP. 8. Estimated rehab length of stay to reach the above functional goals is: 20-24 days. 9. Anticipated D/C setting: Other 10. Anticipated post D/C treatments: SNF 11. Overall Rehab/Functional Prognosis: good  RECOMMENDATIONS: This patient's condition is appropriate for continued rehabilitative care in the following setting: Given exacerbation of previous deficits, pt will likely need a longer course of rehab, especially given limited support at discharge.  Recommend SNF with PM&R follow up. Patient has agreed to participate in recommended program. Potentially Note that insurance prior authorization may be required for reimbursement for recommended care.  Comment: Rehab Admissions Coordinator to follow up.  Maryla Morrow, MD, ABPMR Mcarthur Rossetti Angiulli, PA-C 04/02/2017

## 2017-04-02 NOTE — Progress Notes (Signed)
Inpatient Rehabilitation  Met with patient to discuss team's recommendation for IP Rehab.  Shared booklets, insurance verification letter, and answered initial questions.  Patient reported that her spouse can only provided limited assist to her given his cancer treatments and that they are considering hiring assist for them at home.  I have left a message for the patient's spouse.  Plan to follow for timing of medical readiness, insurance authorization, and IP Rehab bed availability.  Call if questions.    , M.A., CCC/SLP Admission Coordinator  [facility]  Cell [phone]  

## 2017-04-02 NOTE — Progress Notes (Signed)
Inpatient Rehabilitation  [facility]. Plan to follow up with patient later today.  Call if questions.   Charlane FerrettiMelissa Tanazia Achee, M.A., CCC/SLP Admission Coordinator  [facility]  Cell [phone]

## 2017-04-02 NOTE — Progress Notes (Signed)
Occupational Therapy Treatment Patient Details Name: Casey Harrell MRN: [id number] DOB: 1934/10/10 Today's Date: 04/02/2017    History of present illness 82 y.o. female white female with past medical history of CVA with residual right hemiparesis, diabetes, hypertension, COPD, atrial fibrillation on Eliquis, peripheral vascular disease presents with right-sided weakness and her speech since 8 AM this morning.Was admitted to [facility] 03/31/2015 for agitation and increased confusion.  MRI showed small patchy left MCA infarcts with remote bilateral occipital infarcts, embolic pattern.  Workup during that admission revealed the patient also had intracranial atherosclerotic disease and was placed on dual antiplatelets.    OT comments  Pt progressing towards established OT goals. Providing education and handout on FM coordination and theraputty exercises to increase FM skills and strength in RUE/hand. Pt requiring increased time and cues throughout session. Pt requiring Max A and RW for transfers. Pt demonstrating high motivation to participate in exercises and therapy despite fatigue and pain. Continue to recommend dc to CIR for intensive therapy. Will continue to follow acutely as admitted.   Follow Up Recommendations  CIR;Supervision/Assistance - 24 hour    Equipment Recommendations  Other (comment)(TBD at next venue)    Recommendations for Other Services Rehab consult    Precautions / Restrictions Precautions Precautions: Fall Precaution Comments: R ankle bruising Restrictions Weight Bearing Restrictions: No       Mobility Bed Mobility Overal bed mobility: Needs Assistance Bed Mobility: Rolling;Sit to Sidelying Rolling: Min assist Sidelying to sit: Mod assist     Sit to sidelying: Min assist General bed mobility comments: Min A for controlled descent to bed from sitting to sidelying as well as Mi nA to elevate RLE over EOB.  Transfers Overall transfer level: Needs  assistance Equipment used: Rolling walker (2 wheeled) Transfers: Sit to/from Stand Sit to Stand: Max assist         General transfer comment: Max A to power into standing from recliner. VCs for hand placement and weight shifting. Blocking R foot. Single episode of R knee buckling while standing.     Balance Overall balance assessment: Needs assistance Sitting-balance support: Feet supported;Single extremity supported Sitting balance-Leahy Scale: Poor Sitting balance - Comments: pt with strong R lateral lean, modA to maintain midline posture, posterior lean with onset of fatigue Postural control: Right lateral lean   Standing balance-Leahy Scale: Zero Standing balance comment: max assist for standing balance                           ADL either performed or assessed with clinical judgement   ADL Overall ADL's : Needs assistance/impaired                                   Functional mobility during ADLs: Maximal assistance;Rolling walker;Cueing for sequencing(for stand pivot only) General ADL Comments: Focused session on RUE excercises to increase strength and FM skills. Provided handouts on FM excercises and theraputty.  Pt also performing lateral lean to L to scoot RLE toward edge of reclienr before sit<>stand; performed three times with Mod A to bring R hip forward.      Vision   Additional Comments: Needs further assessment   Perception     Praxis      Cognition Arousal/Alertness: Awake/alert Behavior During Therapy: WFL for tasks assessed/performed Overall Cognitive Status: Impaired/Different from baseline Area of Impairment: Attention;Problem solving  Current Attention Level: Sustained         Problem Solving: Slow processing;Decreased initiation;Difficulty sequencing;Requires verbal cues;Requires tactile cues General Comments: Pt requiring increased time and cues throughout session.         Exercises  Exercises: Hand exercises;Other exercises Hand Exercises Digit Lifts: AROM;Right;5 reps;Seated Opposition: Right;Strengthening;AROM;Seated(3 reps) Other Exercises Other Exercises: Providing handout on FM excercises. Pt performing finger opposition and digit lifts. Pt requiring increased time and cues and present with fatigue after excercises. Other Exercises: Proved pt with theraputty (yellow) and handout. Pt requiring cues to use R hand only. Pt squeezing putty into ball, rollinging into log, then pinching with 1st digit and thumb.    Shoulder Instructions       General Comments Pt husband and daughter present throughout session    Pertinent Vitals/ Pain       Pain Assessment: Faces Faces Pain Scale: Hurts even more Pain Location: R foot Pain Descriptors / Indicators: Grimacing;Guarding Pain Intervention(s): Monitored during session;Limited activity within patient's tolerance;Repositioned;Ice applied  Home Living                                          Prior Functioning/Environment              Frequency  Min 3X/week        Progress Toward Goals  OT Goals(current goals can now be found in the care plan section)  Progress towards OT goals: Progressing toward goals  Acute Rehab OT Goals Patient Stated Goal: return to PLOF OT Goal Formulation: With patient Time For Goal Achievement: 04/14/17 Potential to Achieve Goals: Good ADL Goals Pt Will Perform Eating: with set-up;with adaptive utensils;sitting Pt Will Perform Grooming: with min assist;sitting Pt Will Transfer to Toilet: with mod assist;ambulating;bedside commode Pt Will Perform Toileting - Clothing Manipulation and hygiene: with min assist;sit to/from stand Pt/caregiver will Perform Home Exercise Program: Right Upper extremity;Increased strength;Increased ROM;With written HEP provided  Plan Discharge plan remains appropriate    Co-evaluation                 AM-PAC PT "6 Clicks"  Daily Activity     Outcome Measure   Help from another person eating meals?: A Little Help from another person taking care of personal grooming?: A Little Help from another person toileting, which includes using toliet, bedpan, or urinal?: A Lot Help from another person bathing (including washing, rinsing, drying)?: A Lot Help from another person to put on and taking off regular upper body clothing?: A Lot Help from another person to put on and taking off regular lower body clothing?: A Lot 6 Click Score: 14    End of Session Equipment Utilized During Treatment: Gait belt  OT Visit Diagnosis: Unsteadiness on feet (R26.81);Other abnormalities of gait and mobility (R26.89);Muscle weakness (generalized) (M62.81);Hemiplegia and hemiparesis Hemiplegia - Right/Left: Right Hemiplegia - dominant/non-dominant: Dominant Hemiplegia - caused by: Cerebral infarction   Activity Tolerance Patient tolerated treatment well   Patient Left in chair;with call bell/phone within reach;with chair alarm set;with nursing/sitter in room;with family/visitor present   Nurse Communication Mobility status        Time: 5621-3086 OT Time Calculation (min): 25 min  Charges: OT General Charges $OT Visit: 1 Visit OT Treatments $Self Care/Home Management : 8-22 mins $Therapeutic Activity: 8-22 mins  Kore Madlock MSOT, OTR/L Acute Rehab Pager: [phone] Office: [phone]   Theodoro Grist Elvin Mccartin 04/02/2017,  3:57 PM

## 2017-04-02 NOTE — Progress Notes (Signed)
Speech Language Pathology Treatment:    Patient Details Name: Casey Harrell MRN: [id number] DOB: [birth year] Today's Date: 04/02/2017 Time: 4098-1191 SLP Time Calculation (min) (ACUTE ONLY): 25 min  Assessment / Plan / Recommendation Clinical Impression  Pt with baseline dementia (impacting memory) and residual word finding deficits from CVA in 2016. However pt appears to have execerbated worsening of aphasia and cognitive deficits. MOCA Blind administered and pt received score of 6 out of 22 possible points (n=>18). Pt with severe deficits in orientation and recall of new information which might be related to baseline dementia. However, pt with increased difficulty sustaining attention to task in minimally distracting environment, demonstrating awareness of deficits and completing basic problem solving tasks. Pt also presents with moderate deficits in word finding and within conversation, pt has difficulty with specific language use and general talks around topics. Pt presents with mild flaccid dysarthria d/t moderate right facial weakness. Pt is ~ 80% intelligible at the simple conversation level with cues beneficial to increase vocal intensity and over-articulation. Pt left in bed with family present and ST to continue following acutely with ST recommended at next venue of care.    HPI HPI: 82 y.o. female white female with past medical history of mild dementia, CVA with residual right hemiparesis, diabetes, hypertension, COPD, atrial fibrillation on Eliquis, peripheral vascular disease presented with right-sided weakness 03/29/17. Pt also admitted to [facility] in 03/2015 at which time MRI showed small patchy left MCA infarcts with remote bilateral occipital infarcts, embolic pattern. Pt had resulting mild fluent aphasia complicated by baseline dementia; she received treatment in CIR and was d/c at supervision level. This admission, MRI 03/31/17 showed small acute infarct of the left pons measuring  approximately 8 x 3 mm.       SLP Plan  Continue with current plan of care       Recommendations                   General recommendations: Rehab consult Follow up Recommendations: Inpatient Rehab SLP Visit Diagnosis: Cognitive communication deficit (Y78.295) Plan: Continue with current plan of care       GO                Casey Harrell 04/02/2017, 10:31 AM

## 2017-04-03 ENCOUNTER — Encounter (HOSPITAL_COMMUNITY): Payer: Self-pay | Admitting: *Deleted

## 2017-04-03 ENCOUNTER — Inpatient Hospital Stay (HOSPITAL_COMMUNITY)
Admission: RE | Admit: 2017-04-03 | Discharge: 2017-04-14 | DRG: 057 | Disposition: A | Discharge status: Home Health | Payer: Medicare Other | Source: Intra-hospital | Attending: Physical Medicine & Rehabilitation | Admitting: Physical Medicine & Rehabilitation

## 2017-04-03 ENCOUNTER — Other Ambulatory Visit: Payer: Self-pay

## 2017-04-03 DIAGNOSIS — E785 Hyperlipidemia, unspecified: Secondary | ICD-10-CM | POA: Diagnosis not present

## 2017-04-03 DIAGNOSIS — E1151 Type 2 diabetes mellitus with diabetic peripheral angiopathy without gangrene: Secondary | ICD-10-CM | POA: Diagnosis present

## 2017-04-03 DIAGNOSIS — E1142 Type 2 diabetes mellitus with diabetic polyneuropathy: Secondary | ICD-10-CM | POA: Diagnosis not present

## 2017-04-03 DIAGNOSIS — I6350 Cerebral infarction due to unspecified occlusion or stenosis of unspecified cerebral artery: Secondary | ICD-10-CM | POA: Diagnosis not present

## 2017-04-03 DIAGNOSIS — I69328 Other speech and language deficits following cerebral infarction: Secondary | ICD-10-CM

## 2017-04-03 DIAGNOSIS — Z801 Family history of malignant neoplasm of trachea, bronchus and lung: Secondary | ICD-10-CM

## 2017-04-03 DIAGNOSIS — Z833 Family history of diabetes mellitus: Secondary | ICD-10-CM

## 2017-04-03 DIAGNOSIS — Z8249 Family history of ischemic heart disease and other diseases of the circulatory system: Secondary | ICD-10-CM

## 2017-04-03 DIAGNOSIS — I10 Essential (primary) hypertension: Secondary | ICD-10-CM | POA: Diagnosis present

## 2017-04-03 DIAGNOSIS — J449 Chronic obstructive pulmonary disease, unspecified: Secondary | ICD-10-CM | POA: Diagnosis present

## 2017-04-03 DIAGNOSIS — Z7901 Long term (current) use of anticoagulants: Secondary | ICD-10-CM | POA: Diagnosis not present

## 2017-04-03 DIAGNOSIS — Z794 Long term (current) use of insulin: Secondary | ICD-10-CM

## 2017-04-03 DIAGNOSIS — Z79899 Other long term (current) drug therapy: Secondary | ICD-10-CM

## 2017-04-03 DIAGNOSIS — Z825 Family history of asthma and other chronic lower respiratory diseases: Secondary | ICD-10-CM | POA: Diagnosis not present

## 2017-04-03 DIAGNOSIS — E039 Hypothyroidism, unspecified: Secondary | ICD-10-CM | POA: Diagnosis not present

## 2017-04-03 DIAGNOSIS — Z823 Family history of stroke: Secondary | ICD-10-CM | POA: Diagnosis not present

## 2017-04-03 DIAGNOSIS — Z8261 Family history of arthritis: Secondary | ICD-10-CM

## 2017-04-03 DIAGNOSIS — I69398 Other sequelae of cerebral infarction: Secondary | ICD-10-CM | POA: Diagnosis not present

## 2017-04-03 DIAGNOSIS — I480 Paroxysmal atrial fibrillation: Secondary | ICD-10-CM | POA: Diagnosis not present

## 2017-04-03 DIAGNOSIS — R269 Unspecified abnormalities of gait and mobility: Secondary | ICD-10-CM | POA: Diagnosis not present

## 2017-04-03 DIAGNOSIS — Z8379 Family history of other diseases of the digestive system: Secondary | ICD-10-CM

## 2017-04-03 DIAGNOSIS — I69319 Unspecified symptoms and signs involving cognitive functions following cerebral infarction: Secondary | ICD-10-CM | POA: Diagnosis not present

## 2017-04-03 DIAGNOSIS — S93401D Sprain of unspecified ligament of right ankle, subsequent encounter: Secondary | ICD-10-CM | POA: Diagnosis not present

## 2017-04-03 DIAGNOSIS — K219 Gastro-esophageal reflux disease without esophagitis: Secondary | ICD-10-CM | POA: Diagnosis present

## 2017-04-03 DIAGNOSIS — I69351 Hemiplegia and hemiparesis following cerebral infarction affecting right dominant side: Principal | ICD-10-CM

## 2017-04-03 DIAGNOSIS — G8191 Hemiplegia, unspecified affecting right dominant side: Secondary | ICD-10-CM | POA: Diagnosis not present

## 2017-04-03 DIAGNOSIS — E1165 Type 2 diabetes mellitus with hyperglycemia: Secondary | ICD-10-CM | POA: Diagnosis not present

## 2017-04-03 DIAGNOSIS — I639 Cerebral infarction, unspecified: Secondary | ICD-10-CM | POA: Diagnosis present

## 2017-04-03 LAB — GLUCOSE, CAPILLARY
Glucose-Capillary: 241 mg/dL — ABNORMAL HIGH (ref 65–99)
Glucose-Capillary: 336 mg/dL — ABNORMAL HIGH (ref 65–99)
Glucose-Capillary: 370 mg/dL — ABNORMAL HIGH (ref 65–99)
Glucose-Capillary: 425 mg/dL — ABNORMAL HIGH (ref 65–99)

## 2017-04-03 MED ORDER — INSULIN DETEMIR 100 UNIT/ML ~~LOC~~ SOLN
30.0000 [IU] | Freq: Two times a day (BID) | SUBCUTANEOUS | Status: DC
  Administered 2017-04-03 – 2017-04-04 (×2): 30 [IU] via SUBCUTANEOUS
  Filled 2017-04-03 (×2): qty 0.3

## 2017-04-03 MED ORDER — AMLODIPINE BESYLATE 5 MG PO TABS
5.0000 mg | ORAL_TABLET | Freq: Every day | ORAL | Status: DC
  Administered 2017-04-04 – 2017-04-14 (×11): 5 mg via ORAL
  Filled 2017-04-03 (×11): qty 1

## 2017-04-03 MED ORDER — ATORVASTATIN CALCIUM 40 MG PO TABS
40.0000 mg | ORAL_TABLET | Freq: Every day | ORAL | Status: DC
  Administered 2017-04-04 – 2017-04-14 (×11): 40 mg via ORAL
  Filled 2017-04-03 (×11): qty 1

## 2017-04-03 MED ORDER — DULOXETINE HCL 30 MG PO CPEP
30.0000 mg | ORAL_CAPSULE | Freq: Two times a day (BID) | ORAL | Status: DC
  Administered 2017-04-03 – 2017-04-14 (×22): 30 mg via ORAL
  Filled 2017-04-03 (×22): qty 1

## 2017-04-03 MED ORDER — INSULIN DETEMIR 100 UNIT/ML ~~LOC~~ SOLN
10.0000 [IU] | Freq: Once | SUBCUTANEOUS | Status: AC
  Administered 2017-04-03: 10 [IU] via SUBCUTANEOUS
  Filled 2017-04-03: qty 0.1

## 2017-04-03 MED ORDER — APIXABAN 5 MG PO TABS
5.0000 mg | ORAL_TABLET | Freq: Two times a day (BID) | ORAL | Status: DC
  Administered 2017-04-03 – 2017-04-14 (×22): 5 mg via ORAL
  Filled 2017-04-03 (×4): qty 1
  Filled 2017-04-03: qty 2
  Filled 2017-04-03 (×17): qty 1

## 2017-04-03 MED ORDER — ACETAMINOPHEN 650 MG RE SUPP: 650.0000 mg | RECTAL | Status: DC | PRN

## 2017-04-03 MED ORDER — IRBESARTAN 75 MG PO TABS
150.0000 mg | ORAL_TABLET | Freq: Every day | ORAL | Status: DC
Start: 2017-04-04 — End: 2017-04-14
  Administered 2017-04-04 – 2017-04-14 (×11): 150 mg via ORAL
  Filled 2017-04-03 (×11): qty 2

## 2017-04-03 MED ORDER — MECLIZINE HCL 25 MG PO TABS
25.0000 mg | ORAL_TABLET | Freq: Three times a day (TID) | ORAL | Status: DC | PRN
  Filled 2017-04-03: qty 1

## 2017-04-03 MED ORDER — ONDANSETRON HCL 4 MG PO TABS
4.0000 mg | ORAL_TABLET | Freq: Four times a day (QID) | ORAL | Status: DC | PRN
Start: 2017-04-03 — End: 2017-04-03

## 2017-04-03 MED ORDER — ASPIRIN 81 MG PO TBEC: 81.0000 mg | DELAYED_RELEASE_TABLET | Freq: Every day | ORAL | 0 refills | Status: AC

## 2017-04-03 MED ORDER — AMLODIPINE BESYLATE 5 MG PO TABS: 5.0000 mg | ORAL_TABLET | Freq: Every day | ORAL | 0 refills | Status: DC

## 2017-04-03 MED ORDER — INSULIN DETEMIR 100 UNIT/ML ~~LOC~~ SOLN
30.0000 [IU] | Freq: Two times a day (BID) | SUBCUTANEOUS | Status: DC
  Filled 2017-04-03: qty 0.3

## 2017-04-03 MED ORDER — DICLOFENAC SODIUM 1 % TD GEL: 2.0000 g | Freq: Four times a day (QID) | TRANSDERMAL | 0 refills | Status: DC

## 2017-04-03 MED ORDER — ACETAMINOPHEN 325 MG PO TABS
650.0000 mg | ORAL_TABLET | ORAL | Status: DC | PRN
  Administered 2017-04-07 – 2017-04-09 (×2): 650 mg via ORAL
  Filled 2017-04-03 (×3): qty 2

## 2017-04-03 MED ORDER — ONDANSETRON HCL 4 MG PO TABS: 4.0000 mg | ORAL_TABLET | Freq: Three times a day (TID) | ORAL | Status: DC | PRN

## 2017-04-03 MED ORDER — IPRATROPIUM-ALBUTEROL 0.5-2.5 (3) MG/3ML IN SOLN: 3.0000 mL | Freq: Four times a day (QID) | RESPIRATORY_TRACT | Status: DC | PRN

## 2017-04-03 MED ORDER — PANTOPRAZOLE SODIUM 40 MG PO TBEC
40.0000 mg | DELAYED_RELEASE_TABLET | Freq: Two times a day (BID) | ORAL | Status: DC
  Administered 2017-04-03 – 2017-04-14 (×22): 40 mg via ORAL
  Filled 2017-04-03 (×22): qty 1

## 2017-04-03 MED ORDER — ONDANSETRON HCL 4 MG/2ML IJ SOLN: 4.0000 mg | Freq: Four times a day (QID) | INTRAMUSCULAR | Status: DC | PRN

## 2017-04-03 MED ORDER — CARVEDILOL 6.25 MG PO TABS
6.2500 mg | ORAL_TABLET | Freq: Two times a day (BID) | ORAL | Status: DC
  Administered 2017-04-04 – 2017-04-14 (×21): 6.25 mg via ORAL
  Filled 2017-04-03 (×21): qty 1

## 2017-04-03 MED ORDER — DICLOFENAC SODIUM 1 % TD GEL
2.0000 g | Freq: Four times a day (QID) | TRANSDERMAL | Status: DC
  Administered 2017-04-03 – 2017-04-13 (×30): 2 g via TOPICAL
  Filled 2017-04-03: qty 100

## 2017-04-03 MED ORDER — SORBITOL 70 % SOLN: 30.0000 mL | Freq: Every day | Status: DC | PRN

## 2017-04-03 MED ORDER — ATORVASTATIN CALCIUM 40 MG PO TABS: 40.0000 mg | ORAL_TABLET | Freq: Every day | ORAL | 0 refills | Status: DC

## 2017-04-03 MED ORDER — LEVOTHYROXINE SODIUM 50 MCG PO TABS
50.0000 ug | ORAL_TABLET | Freq: Every day | ORAL | Status: DC
  Administered 2017-04-04 – 2017-04-14 (×11): 50 ug via ORAL
  Filled 2017-04-03 (×11): qty 1

## 2017-04-03 MED ORDER — PREGABALIN 75 MG PO CAPS
150.0000 mg | ORAL_CAPSULE | Freq: Every day | ORAL | Status: DC
  Administered 2017-04-04 – 2017-04-14 (×11): 150 mg via ORAL
  Filled 2017-04-03 (×11): qty 2

## 2017-04-03 MED ORDER — ACETAMINOPHEN 160 MG/5ML PO SOLN: 650.0000 mg | ORAL | Status: DC | PRN

## 2017-04-03 MED ORDER — ASPIRIN EC 81 MG PO TBEC
81.0000 mg | DELAYED_RELEASE_TABLET | Freq: Every day | ORAL | Status: DC
  Administered 2017-04-04 – 2017-04-14 (×11): 81 mg via ORAL
  Filled 2017-04-03 (×11): qty 1

## 2017-04-03 MED ORDER — INSULIN ASPART 100 UNIT/ML ~~LOC~~ SOLN
0.0000 [IU] | Freq: Three times a day (TID) | SUBCUTANEOUS | Status: DC
  Administered 2017-04-03: 9 [IU] via SUBCUTANEOUS
  Administered 2017-04-04 (×2): 7 [IU] via SUBCUTANEOUS
  Administered 2017-04-04: 9 [IU] via SUBCUTANEOUS
  Administered 2017-04-05: 2 [IU] via SUBCUTANEOUS
  Administered 2017-04-05: 7 [IU] via SUBCUTANEOUS
  Administered 2017-04-05: 5 [IU] via SUBCUTANEOUS
  Administered 2017-04-06: 2 [IU] via SUBCUTANEOUS
  Administered 2017-04-06: 9 [IU] via SUBCUTANEOUS
  Administered 2017-04-06: 5 [IU] via SUBCUTANEOUS
  Administered 2017-04-07: 3 [IU] via SUBCUTANEOUS

## 2017-04-03 NOTE — H&P (Signed)
Physical Medicine and Rehabilitation Admission H&P        Chief Complaint  Patient presents with  . Weakness    LSN 0800 03/29/17  : HPI: Casey Harrell a 82 y.o.right handed femalewith history of hypertension, hyperlipidemia, diabetes mellitus with neuropathy, atrial fibrillation maintained on Eliquisand CVA embolic involving posterior cerebral artery with right-sided weaknessas well as some memory impairment inDecember 2016 and received inpatient rehabilitation services. Per chart reviewand patient,patient lives with spouse, who has cancer. Independent with assistive device using rolling walker. Did not need assist for ADLs. One level home with 6 steps to entry. Presented 03/30/2017 with increasing right sided weakness and difficulty with speech. Cranial CT scanreviewed, unremarkable for acute process. Per report, multifocal old infarcts. Patient did not receive TPA. Troponin negative. MRI showed small acute infarct of the left pons measuring approximately 8 x 3 mm. No associated hemorrhage. MRA motion degraded showed moderate to severe stenosis of the supraclinoid right ICA. No occlusion or high-grade stenosis of the intracranial arch vessels. Echocardiogram with ejection fraction of 60% no wall motion abnormalities. Neurology consulted with aspirin added in addition toEliquisthat patient was on prior to admission. Tolerating a regular diet. Physical and occupational therapy evaluations completed with recommendations of physical medicine rehabilitation consult. Patient was admitted for a comprehensive rehabilitation program    Review of Systems  Constitutional: Negative for chills and fever.  HENT: Negative for hearing loss.   Eyes: Negative for blurred vision and double vision.  Respiratory: Positive for shortness of breath.   Cardiovascular: Positive for palpitations and leg swelling. Negative for chest pain.  Gastrointestinal: Positive for constipation. Negative for  nausea and vomiting.       GERD  Genitourinary: Negative for dysuria, flank pain and hematuria.  Musculoskeletal: Positive for joint pain and myalgias.  Skin: Negative for rash.  Neurological: Positive for dizziness, speech change and focal weakness.  All other systems reviewed and are negative.      Past Medical History:  Diagnosis Date  . ASTHMA 07/15/2008  . COPD (chronic obstructive pulmonary disease) (HCC)   . DIABETES-TYPE 2 07/15/2008  . DIABETIC PERIPHERAL NEUROPATHY 09/02/2009  . GERD 07/15/2008  . HYPERTENSION 07/15/2008  . Normal cardiac stress test    low risk Nuc 2009, Feb 2011  . Paroxysmal atrial fibrillation (HCC)   . PVD (peripheral vascular disease) (HCC) 06/18/09   RCE  . Stroke (HCC)   . Vertigo         Past Surgical History:  Procedure Laterality Date  . ABDOMINAL HYSTERECTOMY  1971  . APPENDECTOMY  1971  . CAROTID ENDARTERECTOMY  06/18/09   RCE  . CESAREAN SECTION    . colectomy and colostomy    . FLEXIBLE SIGMOIDOSCOPY     diverticulitis  . FOOT SURGERY     right foot X 2  . LAPAROTOMY     X 3 with adhesions lysis  . TONSILLECTOMY    . TUBAL LIGATION     Family History  Problem Relation Age of Onset  . Diabetes Mother   . Arthritis Mother   . Heart disease Mother   . Heart disease Father   . Heart disease Sister   . Heart disease Brother   . Cancer Brother 60       lung cancer  . Celiac disease Sister   . Stroke Maternal Grandfather   . Cancer Paternal Grandfather        lung  . Heart disease Sister   . Asthma  Sister    Social History:  reports that  has never smoked. she has never used smokeless tobacco. She reports that she does not drink alcohol or use drugs. Allergies:       Allergies  Allergen Reactions  . Doxycycline Hyclate Nausea And Vomiting  . Morphine Sulfate Other (See Comments)    REACTION: hallucinations  . Iohexol Hives     Code: HIVES, Desc: pt. states she breaks out  in hives 06/15/08   . Moxifloxacin Other (See Comments)    REACTION: unknown         Medications Prior to Admission  Medication Sig Dispense Refill  . apixaban (ELIQUIS) 5 MG TABS tablet TAKE 1 TABLET(5 MG) BY MOUTH TWICE DAILY 60 tablet 11  . atorvastatin (LIPITOR) 40 MG tablet TAKE 1 TABLET BY MOUTH EVERY DAY 90 tablet 3  . carvedilol (COREG) 6.25 MG tablet TAKE 1 TABLET BY MOUTH TWICE A DAY WITH A MEAL 180 tablet 1  . irbesartan (AVAPRO) 150 MG tablet TAKE 1 TABLET BY MOUTH EVERY DAY 90 tablet 1  . LEVEMIR FLEXTOUCH 100 UNIT/ML Pen INJECT 40 UNITS INTO THE SKIN TWICE A DAY (Patient taking differently: INJECT 30 UNITS INTO THE SKIN TWICE A DAY) 3 mL 5  . levothyroxine (SYNTHROID, LEVOTHROID) 50 MCG tablet TAKE 1 TABLET BY MOUTH EVERY DAY BEFORE BREAKFAST 90 tablet 1  . LYRICA 150 MG capsule TAKE ONE CAPSULE BY MOUTH EVERY DAY 90 capsule 1  . metFORMIN (GLUCOPHAGE) 1000 MG tablet TAKE 1 TABLET BY MOUTH TWICE A DAY WITH A MEAL 180 tablet 2  . pantoprazole (PROTONIX) 40 MG tablet TAKE 1 TABLET BY MOUTH TWICE A DAY 180 tablet 1  . acetaminophen (TYLENOL) 325 MG tablet Take 650 mg by mouth every 6 (six) hours as needed (pain).    Marland Kitchen. albuterol (ACCUNEB) 1.25 MG/3ML nebulizer solution Take 3 mLs (1.25 mg total) by nebulization every 4 (four) hours as needed for wheezing. Reported on 04/26/2015 75 mL 12  . CALCIUM PO Take 1 tablet by mouth daily.    . Cholecalciferol (VITAMIN D3) 2000 UNITS TABS Take 2,000 Units by mouth daily. Reported on 04/26/2015    . DULoxetine (CYMBALTA) 30 MG capsule TAKE 1 CAPSULE BY MOUTH EVERY DAY FOR 2 WEEKS, THEN INCREASE TO 1 CAPSULE TWICE A DAY (Patient taking differently: Take 1 capsule (30mg ) twice daily.) 180 capsule 1  . HUMALOG KWIKPEN 100 UNIT/ML KiwkPen INJECT 9-14 UNITS SUBCUTANEOUSLY TWICE DAILY WITH MEAL 9 UNITS BREAKFAST AND 14 UNITS AFTER SUPPER 45 mL 1  . ipratropium-albuterol (DUONEB) 0.5-2.5 (3) MG/3ML SOLN Take 3 mLs by nebulization every 6  (six) hours as needed (shortness of breath or wheezing). 360 mL 0  . meclizine (ANTIVERT) 25 MG tablet Take 1 tablet (25 mg total) by mouth 3 (three) times daily as needed for dizziness. 30 tablet 1  . ondansetron (ZOFRAN ODT) 4 MG disintegrating tablet Take 1 tablet (4 mg total) by mouth every 8 (eight) hours as needed for nausea or vomiting. 8 tablet 0    Drug Regimen Review Drug regimen was reviewed and remains appropriate with no significant issues identified  Home: Home Living Family/patient expects to be discharged to:: Private residence Living Arrangements: Spouse/significant other Available Help at Discharge: Family, Available 24 hours/day Type of Home: House Home Access: Stairs to enter Entergy CorporationEntrance Stairs-Number of Steps: 6 Entrance Stairs-Rails: Can reach both Home Layout: One level Bathroom Shower/Tub: Psychologist, counsellingWalk-in shower, Door Foot LockerBathroom Toilet: Standard Bathroom Accessibility: Yes Home Equipment: Environmental consultantWalker - 4 wheels,  Bedside commode, Shower seat - built in, [employer] - tub/shower, Hand held shower head   Functional History: Prior Function Level of Independence: Independent with assistive device(s) Comments: ambulated with RW, no assist with ADLs, iADLs   Functional Status:  Mobility: Bed Mobility Overal bed mobility: Needs Assistance Bed Mobility: Rolling, Sit to Sidelying Rolling: Min assist Sidelying to sit: Mod assist Supine to sit: Mod assist Sit to supine: Max assist Sit to sidelying: Min assist General bed mobility comments: Min A for controlled descent to bed from sitting to sidelying as well as Mi nA to elevate RLE over EOB. Transfers Overall transfer level: Needs assistance Equipment used: Rolling walker (2 wheeled) Transfers: Sit to/from Stand Sit to Stand: Max assist Stand pivot transfers: Max assist, +2 physical assistance General transfer comment: Max A to power into standing from recliner. VCs for hand placement and weight shifting. Blocking R foot.  Single episode of R knee buckling while standing.  Ambulation/Gait Ambulation/Gait assistance: Max assist, +2 physical assistance, +2 safety/equipment Ambulation Distance (Feet): 10 Feet Assistive device: Rolling walker (2 wheeled) Gait Pattern/deviations: Step-to pattern, Decreased stride length, Decreased dorsiflexion - right, Decreased weight shift to right, Decreased weight shift to left, Antalgic General Gait Details: maxA to maintain upright, pt with strong R lateral lean. several standing rest breaks, several episodes of R knee buckling Gait velocity: slow  ADL: ADL Overall ADL's : Needs assistance/impaired Eating/Feeding: Moderate assistance, Sitting Grooming: Moderate assistance, Sitting Grooming Details (indicate cue type and reason): Pt having just finished brushing her teeth. Daughter and pt reporting she was able to sit in recliner and brush her teeth without much assistance.  Upper Body Bathing: Maximal assistance, Sitting Lower Body Bathing: Maximal assistance, +2 for physical assistance, Sit to/from stand Upper Body Dressing : Maximal assistance, Sitting Lower Body Dressing: Maximal assistance, +2 for physical assistance, Sit to/from stand Toilet Transfer: Maximal assistance, +2 for physical assistance, Stand-pivot, BSC Toilet Transfer Details (indicate cue type and reason): Simulated by transfer EOB>chair Functional mobility during ADLs: Maximal assistance, Rolling walker, Cueing for sequencing(for stand pivot only) General ADL Comments: Focused session on RUE excercises to increase strength and FM skills. Provided handouts on FM excercises and theraputty.  Pt also performing lateral lean to L to scoot RLE toward edge of reclienr before sit<>stand; performed three times with Mod A to bring R hip forward.   Cognition: Cognition Overall Cognitive Status: Impaired/Different from baseline Arousal/Alertness: Awake/alert Orientation Level: Oriented X4 Attention: Focused,  Sustained Focused Attention: Appears intact Sustained Attention: Appears intact Memory: Impaired Memory Impairment: Decreased short term memory Decreased Short Term Memory: Verbal basic, Functional basic(pt did not recall OT having visited her this morning) Awareness: Appears intact Cognition Arousal/Alertness: Awake/alert Behavior During Therapy: WFL for tasks assessed/performed Overall Cognitive Status: Impaired/Different from baseline Area of Impairment: Attention, Problem solving Current Attention Level: Sustained Problem Solving: Slow processing, Decreased initiation, Difficulty sequencing, Requires verbal cues, Requires tactile cues General Comments: Pt requiring increased time and cues throughout session.   Physical Exam: Blood pressure (!) 167/66, pulse 66, temperature 98.7 F (37.1 C), temperature source Oral, resp. rate 20, height 5\' 1"  (1.549 m), weight 65.3 kg (143 lb 15.4 oz), SpO2 97 %. Physical Exam  Constitutional: She appears well-developed and well-nourished.  HENT:  Head: Normocephalic and atraumatic.  Eyes: EOM are normal.  Neck: Normal range of motion. Neck supple. No tracheal deviation present. No thyromegaly present.  Cardiovascular: Normal rate, regular rhythm and normal heart sounds. Exam reveals no friction rub.  No murmur  heard. Respiratory: Effort normal and breath sounds normal. No respiratory distress. She has no wheezes.  GI: Soft. Bowel sounds are normal. She exhibits no distension. There is no tenderness. There is no rebound.  Skin.Warm and dry Musculoskeletal: She exhibitsedema(Right ankle) with tenderness at and below medial malleolus. Mild bruising noted as well.  Neurological. Patient is alert and dysarthric but intelligible. Right facial droop with dysarthria. Some processing delays but fair insight and awareness. LUE and LLE grossly 4+/5 prox to distal. RUE 1+ prox to 2/5 HI. RLE: 1 HF, KE and trace with ADF/PF. She senses pain in all 4 limbs.   Psych: pleasant and appropriate       LabResultsLast48Hours       Results for orders placed or performed during the hospital encounter of 03/29/17 (from the past 48 hour(s))  Glucose, capillary     Status: Abnormal   Collection Time: 04/01/17  9:20 AM  Result Value Ref Range   Glucose-Capillary 381 (H) 65 - 99 mg/dL   Comment 1 Notify RN    Comment 2 Document in Chart   Glucose, capillary     Status: Abnormal   Collection Time: 04/01/17 11:28 AM  Result Value Ref Range   Glucose-Capillary 357 (H) 65 - 99 mg/dL   Comment 1 Notify RN    Comment 2 Document in Chart   Glucose, capillary     Status: Abnormal   Collection Time: 04/01/17  4:10 PM  Result Value Ref Range   Glucose-Capillary 310 (H) 65 - 99 mg/dL   Comment 1 Notify RN    Comment 2 Document in Chart   Glucose, capillary     Status: Abnormal   Collection Time: 04/01/17  9:04 PM  Result Value Ref Range   Glucose-Capillary 383 (H) 65 - 99 mg/dL   Comment 1 Notify RN    Comment 2 Document in Chart   Glucose, capillary     Status: Abnormal   Collection Time: 04/02/17  7:04 AM  Result Value Ref Range   Glucose-Capillary 233 (H) 65 - 99 mg/dL   Comment 1 Notify RN    Comment 2 Document in Chart   Glucose, capillary     Status: Abnormal   Collection Time: 04/02/17 11:38 AM  Result Value Ref Range   Glucose-Capillary 339 (H) 65 - 99 mg/dL   Comment 1 Notify RN    Comment 2 Document in Chart   Glucose, capillary     Status: Abnormal   Collection Time: 04/02/17  4:14 PM  Result Value Ref Range   Glucose-Capillary 303 (H) 65 - 99 mg/dL   Comment 1 Notify RN    Comment 2 Document in Chart   Glucose, capillary     Status: Abnormal   Collection Time: 04/02/17  9:31 PM  Result Value Ref Range   Glucose-Capillary 266 (H) 65 - 99 mg/dL   Comment 1 Notify RN    Comment 2 Document in Chart   Glucose, capillary     Status: Abnormal   Collection Time: 04/03/17   6:08 AM  Result Value Ref Range   Glucose-Capillary 241 (H) 65 - 99 mg/dL   Comment 1 Notify RN    Comment 2 Document in Chart      ImagingResults(Last48hours)  No results found.       Medical Problem List and Plan: 1.  Increased Right-sided weakness secondary to acute infarction left pons as well as history of CVA posterior cerebral artery December 2016 with right-sided residual weakness             -  admit to inpatient rehab 2.  DVT Prophylaxis/Anticoagulation: Eliquis 3. Pain Management: Lyrica 150 mg daily, Cymbalta 30 mg twice a day, Voltaren gel             -right ankle sprain-----ice, PRAFO boot for support, might benefit from ACE wrap during activities. 4. Mood: Provide emotional support 5. Neuropsych: This patient is capable of making decisions on her own behalf. 6. Skin/Wound Care: Routine skin checks 7. Fluids/Electrolytes/Nutrition: Routine I&O's with follow-up chemistries 8. Diabetes mellitus and peripheral neuropathy. Hemoglobin A1c 8.6. Levemir 30 units twice a day. Check blood sugars before meals and at bedtime 9. Hypothyroidism. Synthroid 10. Hypertension. Avapro 150 mg daily, Norvasc 5 mg daily, Coreg 6.25 mg twice a day. 11. Hyperlipidemia. Lipitor 12. GERD. Protonix   Post Admission Physician Evaluation: 1. Functional deficits secondary  to left pontine infarct (as well as prior CVA). 2. Patient is admitted to receive collaborative, interdisciplinary care between the physiatrist, rehab nursing staff, and therapy team. 3. Patient's level of medical complexity and substantial therapy needs in context of that medical necessity cannot be provided at a lesser intensity of care such as a SNF. 4. Patient has experienced substantial functional loss from his/her baseline which was documented above under the "Functional History" and "Functional Status" headings.  Judging by the patient's diagnosis, physical exam, and functional history, the patient has  potential for functional progress which will result in measurable gains while on inpatient rehab.  These gains will be of substantial and practical use upon discharge  in facilitating mobility and self-care at the household level. 5. Physiatrist will provide 24 hour management of medical needs as well as oversight of the therapy plan/treatment and provide guidance as appropriate regarding the interaction of the two. 6. The Preadmission Screening has been reviewed and patient status is unchanged unless otherwise stated above. 7. 24 hour rehab nursing will assist with bladder management, bowel management, safety, skin/wound care, disease management, medication administration, pain management and patient education  and help integrate therapy concepts, techniques,education, etc. 8. PT will assess and treat for/with: Lower extremity strength, range of motion, stamina, balance, functional mobility, safety, adaptive techniques and equipment, NMR, family ed.   Goals are: min to mod assist. 9. OT will assess and treat for/with: ADL's, functional mobility, safety, upper extremity strength, adaptive techniques and equipment, NMR, family ed.   Goals are: min to mod assist. Therapy may proceed with showering this patient. 10. SLP will assess and treat for/with: speech, communication.  Goals are: mod I. 11. Case Management and Social Worker will assess and treat for psychological issues and discharge planning. 12. Team conference will be held weekly to assess progress toward goals and to determine barriers to discharge. 13. Patient will receive at least 3 hours of therapy per day at least 5 days per week. 14. ELOS: 14-20 days       15. Prognosis:  good     Ranelle Oyster, MD, [facility] Physical Medicine & Rehabilitation 04/03/2017  Mcarthur Rossetti Angiulli, PA-C 04/03/2017

## 2017-04-03 NOTE — Progress Notes (Signed)
Patient transferred to 4W20. Family aware of disposition. No belongings at bedside, per pt, family already took her belongings home.   Sim BoastHavy, RN

## 2017-04-03 NOTE — H&P (Signed)
Physical Medicine and Rehabilitation Admission H&P    Chief Complaint  Patient presents with  . Weakness    LSN [id number] 03/29/17  : HPI: Casey Harrell is a 82 y.o. 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 Per chart review and patient, patient lives with spouse, who has cancer. Independent with assistive device using rolling walker. Did not need assist for ADLs. One level home with 6 steps to entry. Presented 03/30/2017 with increasing right sided weakness and difficulty with speech. Cranial CT scan reviewed, unremarkable for acute process. Per report, multifocal old infarcts. Patient did not receive TPA. Troponin negative. MRI showed small acute infarct of the left pons measuring approximately 8 x 3 mm. No associated hemorrhage. MRA motion degraded showed moderate to severe stenosis of the supraclinoid right ICA. No occlusion or high-grade stenosis of the intracranial arch vessels. Echocardiogram with ejection fraction of 60% no wall motion abnormalities. Neurology consulted with aspirin added in addition to Eliquis that patient was on prior to admission. Tolerating a regular diet. Physical and occupational therapy evaluations completed with recommendations of physical medicine rehabilitation consult. Patient was admitted for a comprehensive rehabilitation program    Review of Systems  Constitutional: Negative for chills and fever.  HENT: Negative for hearing loss.   Eyes: Negative for blurred vision and double vision.  Respiratory: Positive for shortness of breath.   Cardiovascular: Positive for palpitations and leg swelling. Negative for chest pain.  Gastrointestinal: Positive for constipation. Negative for nausea and  vomiting.       GERD  Genitourinary: Negative for dysuria, flank pain and hematuria.  Musculoskeletal: Positive for joint pain and myalgias.  Skin: Negative for rash.  Neurological: Positive for dizziness, speech change and focal weakness.  All other systems reviewed and are negative.  Past Medical History:  Diagnosis Date  . ASTHMA 07/15/2008  . COPD (chronic obstructive pulmonary disease) (HCC)   . DIABETES-TYPE 2 07/15/2008  . DIABETIC PERIPHERAL NEUROPATHY 09/02/2009  . GERD 07/15/2008  . HYPERTENSION 07/15/2008  . Normal cardiac stress test    low risk Nuc 2009, Feb 2011  . Paroxysmal atrial fibrillation (HCC)   . PVD (peripheral vascular disease) (HCC) 06/18/09   RCE  . Stroke (HCC)   . Vertigo    Past Surgical History:  Procedure Laterality Date  . ABDOMINAL HYSTERECTOMY  1971  . APPENDECTOMY  1971  . CAROTID ENDARTERECTOMY  06/18/09   RCE  . CESAREAN SECTION    . colectomy and colostomy    . FLEXIBLE SIGMOIDOSCOPY     diverticulitis  . FOOT SURGERY     right foot X 2  . LAPAROTOMY     X 3 with adhesions lysis  . TONSILLECTOMY    . TUBAL LIGATION     Family History  Problem Relation Age of Onset  . Diabetes Mother   . Arthritis Mother   . Heart disease Mother   . Heart disease Father   . Heart disease Sister   . Heart disease Brother   . Cancer Brother 60       lung cancer  . Celiac disease Sister   . Stroke Maternal Grandfather   . Cancer Paternal Grandfather        lung  . Heart disease Sister   . Asthma Sister  Social History:  reports that  has never smoked. she has never used smokeless tobacco. She reports that she does not drink alcohol or use drugs. Allergies:  Allergies  Allergen Reactions  . Doxycycline Hyclate Nausea And Vomiting  . Morphine Sulfate Other (See Comments)    REACTION: hallucinations  . Iohexol Hives     Code: HIVES, Desc: pt. states she breaks out in hives 06/15/08   . Moxifloxacin Other (See Comments)    REACTION: unknown    Medications Prior to Admission  Medication Sig Dispense Refill  . apixaban (ELIQUIS) 5 MG TABS tablet TAKE 1 TABLET(5 MG) BY MOUTH TWICE DAILY 60 tablet 11  . atorvastatin (LIPITOR) 40 MG tablet TAKE 1 TABLET BY MOUTH EVERY DAY 90 tablet 3  . carvedilol (COREG) 6.25 MG tablet TAKE 1 TABLET BY MOUTH TWICE A DAY WITH A MEAL 180 tablet 1  . irbesartan (AVAPRO) 150 MG tablet TAKE 1 TABLET BY MOUTH EVERY DAY 90 tablet 1  . LEVEMIR FLEXTOUCH 100 UNIT/ML Pen INJECT 40 UNITS INTO THE SKIN TWICE A DAY (Patient taking differently: INJECT 30 UNITS INTO THE SKIN TWICE A DAY) 3 mL 5  . levothyroxine (SYNTHROID, LEVOTHROID) 50 MCG tablet TAKE 1 TABLET BY MOUTH EVERY DAY BEFORE BREAKFAST 90 tablet 1  . LYRICA 150 MG capsule TAKE ONE CAPSULE BY MOUTH EVERY DAY 90 capsule 1  . metFORMIN (GLUCOPHAGE) 1000 MG tablet TAKE 1 TABLET BY MOUTH TWICE A DAY WITH A MEAL 180 tablet 2  . pantoprazole (PROTONIX) 40 MG tablet TAKE 1 TABLET BY MOUTH TWICE A DAY 180 tablet 1  . acetaminophen (TYLENOL) 325 MG tablet Take 650 mg by mouth every 6 (six) hours as needed (pain).    Marland Kitchen albuterol (ACCUNEB) 1.25 MG/3ML nebulizer solution Take 3 mLs (1.25 mg total) by nebulization every 4 (four) hours as needed for wheezing. Reported on 04/26/2015 75 mL 12  . CALCIUM PO Take 1 tablet by mouth daily.    . Cholecalciferol (VITAMIN D3) 2000 UNITS TABS Take 2,000 Units by mouth daily. Reported on 04/26/2015    . DULoxetine (CYMBALTA) 30 MG capsule TAKE 1 CAPSULE BY MOUTH EVERY DAY FOR 2 WEEKS, THEN INCREASE TO 1 CAPSULE TWICE A DAY (Patient taking differently: Take 1 capsule (30mg ) twice daily.) 180 capsule 1  . HUMALOG KWIKPEN 100 UNIT/ML KiwkPen INJECT 9-14 UNITS SUBCUTANEOUSLY TWICE DAILY WITH MEAL 9 UNITS BREAKFAST AND 14 UNITS AFTER SUPPER 45 mL 1  . ipratropium-albuterol (DUONEB) 0.5-2.5 (3) MG/3ML SOLN Take 3 mLs by nebulization every 6 (six) hours as needed (shortness of breath or wheezing). 360 mL 0  . meclizine (ANTIVERT) 25 MG  tablet Take 1 tablet (25 mg total) by mouth 3 (three) times daily as needed for dizziness. 30 tablet 1  . ondansetron (ZOFRAN ODT) 4 MG disintegrating tablet Take 1 tablet (4 mg total) by mouth every 8 (eight) hours as needed for nausea or vomiting. 8 tablet 0    Drug Regimen Review Drug regimen was reviewed and remains appropriate with no significant issues identified  Home: Home Living Family/patient expects to be discharged to:: Private residence Living Arrangements: Spouse/significant other Available Help at Discharge: Family, Available 24 hours/day Type of Home: House Home Access: Stairs to enter [employer] of Steps: 6 Entrance Stairs-Rails: Can reach both Home Layout: One level Bathroom Shower/Tub: Psychologist, counselling, Door [employer] Toilet: Standard Bathroom Accessibility: Yes Home Equipment: Environmental consultant - 4 wheels, Bedside commode, Shower seat - built in, [employer] - tub/shower, Hand held shower head  Functional History: Prior Function Level of Independence: Independent with assistive device(s) Comments: ambulated with RW, no assist with ADLs, iADLs   Functional Status:  Mobility: Bed Mobility Overal bed mobility: Needs Assistance Bed Mobility: Rolling, Sit to Sidelying Rolling: Min assist Sidelying to sit: Mod assist Supine to sit: Mod assist Sit to supine: Max assist Sit to sidelying: Min assist General bed mobility comments: Min A for controlled descent to bed from sitting to sidelying as well as Mi nA to elevate RLE over EOB. Transfers Overall transfer level: Needs assistance Equipment used: Rolling walker (2 wheeled) Transfers: Sit to/from Stand Sit to Stand: Max assist Stand pivot transfers: Max assist, +2 physical assistance General transfer comment: Max A to power into standing from recliner. VCs for hand placement and weight shifting. Blocking R foot. Single episode of R knee buckling while standing.  Ambulation/Gait Ambulation/Gait assistance: Max  assist, +2 physical assistance, +2 safety/equipment Ambulation Distance (Feet): 10 Feet Assistive device: Rolling walker (2 wheeled) Gait Pattern/deviations: Step-to pattern, Decreased stride length, Decreased dorsiflexion - right, Decreased weight shift to right, Decreased weight shift to left, Antalgic General Gait Details: maxA to maintain upright, pt with strong R lateral lean. several standing rest breaks, several episodes of R knee buckling Gait velocity: slow    ADL: ADL Overall ADL's : Needs assistance/impaired Eating/Feeding: Moderate assistance, Sitting Grooming: Moderate assistance, Sitting Grooming Details (indicate cue type and reason): Pt having just finished brushing her teeth. Daughter and pt reporting she was able to sit in recliner and brush her teeth without much assistance.  Upper Body Bathing: Maximal assistance, Sitting Lower Body Bathing: Maximal assistance, +2 for physical assistance, Sit to/from stand Upper Body Dressing : Maximal assistance, Sitting Lower Body Dressing: Maximal assistance, +2 for physical assistance, Sit to/from stand Toilet Transfer: Maximal assistance, +2 for physical assistance, Stand-pivot, BSC Toilet Transfer Details (indicate cue type and reason): Simulated by transfer EOB>chair Functional mobility during ADLs: Maximal assistance, Rolling walker, Cueing for sequencing(for stand pivot only) General ADL Comments: Focused session on RUE excercises to increase strength and FM skills. Provided handouts on FM excercises and theraputty.  Pt also performing lateral lean to L to scoot RLE toward edge of reclienr before sit<>stand; performed three times with Mod A to bring R hip forward.   Cognition: Cognition Overall Cognitive Status: Impaired/Different from baseline Arousal/Alertness: Awake/alert Orientation Level: Oriented X4 Attention: Focused, Sustained Focused Attention: Appears intact Sustained Attention: Appears intact Memory:  Impaired Memory Impairment: Decreased short term memory Decreased Short Term Memory: Verbal basic, Functional basic(pt did not recall OT having visited her this morning) Awareness: Appears intact Cognition Arousal/Alertness: Awake/alert Behavior During Therapy: WFL for tasks assessed/performed Overall Cognitive Status: Impaired/Different from baseline Area of Impairment: Attention, Problem solving Current Attention Level: Sustained Problem Solving: Slow processing, Decreased initiation, Difficulty sequencing, Requires verbal cues, Requires tactile cues General Comments: Pt requiring increased time and cues throughout session.   Physical Exam: Blood pressure (!) 167/66, pulse 66, temperature 98.7 F (37.1 C), temperature source Oral, resp. rate 20, height 5\' 1"  (1.549 m), weight 65.3 kg (143 lb 15.4 oz), SpO2 97 %. Physical Exam  Constitutional: She appears well-developed and well-nourished.  HENT:  Head: Normocephalic and atraumatic.  Eyes: EOM are normal.  Neck: Normal range of motion. Neck supple. No tracheal deviation present. No thyromegaly present.  Cardiovascular: Normal rate, regular rhythm and normal heart sounds. Exam reveals no friction rub.  No murmur heard. Respiratory: Effort normal and breath sounds normal. No respiratory distress. She has no wheezes.  GI: Soft. Bowel sounds are normal. She exhibits no distension. There is no tenderness. There is no rebound.  Skin.Warm and dry Musculoskeletal: She exhibits edema (Right ankle) with tenderness at and below medial malleolus. Mild bruising noted as well.  Neurological. Patient is alert and dysarthric but intelligible. Right facial droop with dysarthria. Some processing delays but fair insight and awareness. LUE and LLE grossly 4+/5 prox to distal. RUE 1+ prox to 2/5 HI. RLE: 1 HF, KE and trace with ADF/PF. She senses pain in all 4 limbs.  Psych: pleasant and appropriate       Results for orders placed or performed  during the hospital encounter of 03/29/17 (from the past 48 hour(s))  Glucose, capillary     Status: Abnormal   Collection Time: 04/01/17  9:20 AM  Result Value Ref Range   Glucose-Capillary 381 (H) 65 - 99 mg/dL   Comment 1 Notify RN    Comment 2 Document in Chart   Glucose, capillary     Status: Abnormal   Collection Time: 04/01/17 11:28 AM  Result Value Ref Range   Glucose-Capillary 357 (H) 65 - 99 mg/dL   Comment 1 Notify RN    Comment 2 Document in Chart   Glucose, capillary     Status: Abnormal   Collection Time: 04/01/17  4:10 PM  Result Value Ref Range   Glucose-Capillary 310 (H) 65 - 99 mg/dL   Comment 1 Notify RN    Comment 2 Document in Chart   Glucose, capillary     Status: Abnormal   Collection Time: 04/01/17  9:04 PM  Result Value Ref Range   Glucose-Capillary 383 (H) 65 - 99 mg/dL   Comment 1 Notify RN    Comment 2 Document in Chart   Glucose, capillary     Status: Abnormal   Collection Time: 04/02/17  7:04 AM  Result Value Ref Range   Glucose-Capillary 233 (H) 65 - 99 mg/dL   Comment 1 Notify RN    Comment 2 Document in Chart   Glucose, capillary     Status: Abnormal   Collection Time: 04/02/17 11:38 AM  Result Value Ref Range   Glucose-Capillary 339 (H) 65 - 99 mg/dL   Comment 1 Notify RN    Comment 2 Document in Chart   Glucose, capillary     Status: Abnormal   Collection Time: 04/02/17  4:14 PM  Result Value Ref Range   Glucose-Capillary 303 (H) 65 - 99 mg/dL   Comment 1 Notify RN    Comment 2 Document in Chart   Glucose, capillary     Status: Abnormal   Collection Time: 04/02/17  9:31 PM  Result Value Ref Range   Glucose-Capillary 266 (H) 65 - 99 mg/dL   Comment 1 Notify RN    Comment 2 Document in Chart   Glucose, capillary     Status: Abnormal   Collection Time: 04/03/17  6:08 AM  Result Value Ref Range   Glucose-Capillary 241 (H) 65 - 99 mg/dL   Comment 1 Notify RN    Comment 2 Document in Chart    No results found.     Medical  Problem List and Plan: 1.  Increased Right-sided weakness secondary to acute infarction left pons as well as history of CVA posterior cerebral artery December 2016 with right-sided residual weakness  -admit to inpatient rehab 2.  DVT Prophylaxis/Anticoagulation: Eliquis 3. Pain Management: Lyrica 150 mg daily, Cymbalta 30 mg twice a day, Voltaren gel   -  right ankle sprain-----ice, PRAFO boot for support, might benefit from ACE wrap during activities. 4. Mood: Provide emotional support 5. Neuropsych: This patient is capable of making decisions on her own behalf. 6. Skin/Wound Care: Routine skin checks 7. Fluids/Electrolytes/Nutrition: Routine I&O's with follow-up chemistries 8. Diabetes mellitus and peripheral neuropathy. Hemoglobin A1c 8.6. Levemir 30 units twice a day. Check blood sugars before meals and at bedtime 9. Hypothyroidism. Synthroid 10. Hypertension. Avapro 150 mg daily, Norvasc 5 mg daily, Coreg 6.25 mg twice a day. 11. Hyperlipidemia. Lipitor 12. GERD. Protonix   Post Admission Physician Evaluation: 1. Functional deficits secondary  to left pontine infarct (as well as prior CVA). 2. Patient is admitted to receive collaborative, interdisciplinary care between the physiatrist, rehab nursing staff, and therapy team. 3. Patient's level of medical complexity and substantial therapy needs in context of that medical necessity cannot be provided at a lesser intensity of care such as a SNF. 4. Patient has experienced substantial functional loss from his/her baseline which was documented above under the "Functional History" and "Functional Status" headings.  Judging by the patient's diagnosis, physical exam, and functional history, the patient has potential for functional progress which will result in measurable gains while on inpatient rehab.  These gains will be of substantial and practical use upon discharge  in facilitating mobility and self-care at the household level. 5. Physiatrist  will provide 24 hour management of medical needs as well as oversight of the therapy plan/treatment and provide guidance as appropriate regarding the interaction of the two. 6. The Preadmission Screening has been reviewed and patient status is unchanged unless otherwise stated above. 7. 24 hour rehab nursing will assist with bladder management, bowel management, safety, skin/wound care, disease management, medication administration, pain management and patient education  and help integrate therapy concepts, techniques,education, etc. 8. PT will assess and treat for/with: Lower extremity strength, range of motion, stamina, balance, functional mobility, safety, adaptive techniques and equipment, NMR, family ed.   Goals are: min to mod assist. 9. OT will assess and treat for/with: ADL's, functional mobility, safety, upper extremity strength, adaptive techniques and equipment, NMR, family ed.   Goals are: min to mod assist. Therapy may proceed with showering this patient. 10. SLP will assess and treat for/with: speech, communication.  Goals are: mod I. 11. Case Management and Social Worker will assess and treat for psychological issues and discharge planning. 12. Team conference will be held weekly to assess progress toward goals and to determine barriers to discharge. 13. Patient will receive at least 3 hours of therapy per day at least 5 days per week. 14. ELOS: 14-20 days       15. Prognosis:  good     Ranelle Oyster, MD, [facility] Physical Medicine & Rehabilitation 04/03/2017  Mcarthur Rossetti Angiulli, PA-C 04/03/2017

## 2017-04-03 NOTE — PMR Pre-admission (Signed)
PMR Admission Coordinator Pre-Admission Assessment  Patient: Casey Harrell is an 82 y.o., female MRN: [id number] DOB: 1934-11-20 Height: 5\' 1"  (154.9 cm) Weight: 65.3 kg (143 lb 15.4 oz)              Insurance Information HMO:     PPO: X     PCP:      IPA:      80/20:      OTHER:  PRIMARY: UHC Medicare       Policy#: [id number]      Subscriber: Self CM Name: Rebeca AlertSunny Smith      Phone#: [phone]     Fax#: [phone] Pre-Cert#: [id number] given by Gweneth DimitriLisa Jacobs       Employer: Retired  Benefits:  Phone #: [phone]     Name: [facility] at [email or web address] Eff. Date: 05/08/16     Deduct: $0      Out of Pocket Max: $4500      Life Max: N/A CIR: $345 a day, days 1-5      SNF: $0 a day, days 1-20; $160 a day, days 21-49; $0 a day, days 50-100 Outpatient: Necessity      Co-Pay: $40 per visit  Home Health: Necessity, 100%      Co-Pay: None DME: 80%     Co-Pay: 20% Providers: In-network   SECONDARY: None      Policy#:       Subscriber:  CM Name:       Phone#:      Fax#:  Pre-Cert#:       Employer:  Benefits:  Phone #:      Name:  Eff. Date:      Deduct:       Out of Pocket Max:       Life Max:  CIR:       SNF:  Outpatient:      Co-Pay:  Home Health:       Co-Pay:  DME:      Co-Pay:   Medicaid Application Date:       Case Manager:  Disability Application Date:       Case Worker:   Emergency Contact Information Contact Information    Name Relation Home Work Mobile   Casey Harrell,Casey Harrell Spouse [phone]     Casey Harrell,Casey Harrell Son   [phone]   Casey Harrell,Casey Harrell Grandaughter [phone]       Current Medical History  Patient Admitting Diagnosis:  Small acute infarct of the left pons   History of Present Illness: Casey Harrell a 82 y.o.right handed femalewith history of hypertension, hyperlipidemia, diabetes mellitus with neuropathy, atrial fibrillation maintained on Eliquisand CVA embolic involving posterior cerebral artery with right-sided weaknessas well as some memory impairment inDecember  2016 and received inpatient rehabilitation services. Per chart reviewand patient report,she lives with spouse, who has cancer. Independent with intermittent use of assistive device, rolling walker. Did not need assist for ADLs. One level home with 6 steps to entry. Presented 03/30/2017 with increasing right sided weakness and difficulty with speech. Cranial CT scanreviewed, unremarkable for acute process. Per report, multifocal old infarcts. Patient did not receive TPA. Troponin negative. MRI showed small acute infarct of the left pons measuring approximately 8 x 3 mm. No associated hemorrhage. MRA motion degraded showed moderate to severe stenosis of the supraclinoid right ICA. No occlusion or high-grade stenosis of the intracranial arch vessels. Echocardiogram with ejection fraction of 60% no wall motion abnormalities. Neurology consulted with aspirin added in addition toEliquisthat patient was on prior  to admission. Tolerating a regular texture diet with thin liquids. Physical and occupational therapy evaluations completed with recommendations of physical medicine rehabilitation consult. Patient was admitted for a comprehensive rehabilitation program 04/03/17.   NIH Total: 7    Past Medical History  Past Medical History:  Diagnosis Date  . ASTHMA 07/15/2008  . COPD (chronic obstructive pulmonary disease) (HCC)   . DIABETES-TYPE 2 07/15/2008  . DIABETIC PERIPHERAL NEUROPATHY 09/02/2009  . GERD 07/15/2008  . HYPERTENSION 07/15/2008  . Normal cardiac stress test    low risk Nuc 2009, Feb 2011  . Paroxysmal atrial fibrillation (HCC)   . PVD (peripheral vascular disease) (HCC) 06/18/09   RCE  . Stroke (HCC)   . Vertigo     Family History  family history includes Arthritis in her mother; Asthma in her sister; Cancer in her paternal grandfather; Cancer (age of onset: 3160) in her brother; Celiac disease in her sister; Diabetes in her mother; Heart disease in her brother, father, mother, sister, and  sister; Stroke in her maternal grandfather.  Prior Rehab/Hospitalizations:  Has the patient had major surgery during 100 days prior to admission? No  Current Medications   Current Facility-Administered Medications:  .  acetaminophen (TYLENOL) tablet 650 mg, 650 mg, Oral, Q4H PRN, 650 mg at 04/01/17 [id number] **OR** acetaminophen (TYLENOL) solution 650 mg, 650 mg, Per Tube, Q4H PRN **OR** acetaminophen (TYLENOL) suppository 650 mg, 650 mg, Rectal, Q4H PRN, Pearson GrippeKim, James, MD .  amLODipine (NORVASC) tablet 5 mg, 5 mg, Oral, Daily, Opyd, Lavone Neriimothy S, MD, 5 mg at 04/03/17 0939 .  apixaban (ELIQUIS) tablet 5 mg, 5 mg, Oral, BID, Stevphen RochesterLedford, James L, RPH, 5 mg at 04/03/17 0940 .  aspirin EC tablet 81 mg, 81 mg, Oral, Daily, Marvel PlanXu, Jindong, MD, 81 mg at 04/03/17 0940 .  atorvastatin (LIPITOR) tablet 40 mg, 40 mg, Oral, Daily, Pearson GrippeKim, James, MD, 40 mg at 04/03/17 0939 .  carvedilol (COREG) tablet 6.25 mg, 6.25 mg, Oral, BID WC, Pearson GrippeKim, James, MD, 6.25 mg at 04/03/17 0939 .  diclofenac sodium (VOLTAREN) 1 % transdermal gel 2 g, 2 g, Topical, QID, Arrien, York RamMauricio Daniel, MD, 2 g at 04/03/17 0940 .  DULoxetine (CYMBALTA) DR capsule 30 mg, 30 mg, Oral, BID, Pearson GrippeKim, James, MD, 30 mg at 04/03/17 0939 .  insulin aspart (novoLOG) injection 0-9 Units, 0-9 Units, Subcutaneous, TID WC, Arrien, York RamMauricio Daniel, MD, 7 Units at 04/03/17 1209 .  insulin detemir (LEVEMIR) injection 30 Units, 30 Units, Subcutaneous, BID, Arrien, York RamMauricio Daniel, MD .  ipratropium-albuterol (DUONEB) 0.5-2.5 (3) MG/3ML nebulizer solution 3 mL, 3 mL, Nebulization, Q6H PRN, Pearson GrippeKim, James, MD .  irbesartan (AVAPRO) tablet 150 mg, 150 mg, Oral, Daily, Pearson GrippeKim, James, MD, 150 mg at 04/03/17 0939 .  levothyroxine (SYNTHROID, LEVOTHROID) tablet 50 mcg, 50 mcg, Oral, QAC breakfast, Pearson GrippeKim, James, MD, 50 mcg at 04/03/17 [phone] .  meclizine (ANTIVERT) tablet 25 mg, 25 mg, Oral, TID PRN, Pearson GrippeKim, James, MD .  ondansetron [facility](ZOFRAN) tablet 4 mg, 4 mg, Oral, Q8H PRN, Pearson GrippeKim, James, MD .   pantoprazole (PROTONIX) EC tablet 40 mg, 40 mg, Oral, BID, Pearson GrippeKim, James, MD, 40 mg at 04/03/17 0939 .  pregabalin (LYRICA) capsule 150 mg, 150 mg, Oral, Daily, Pearson GrippeKim, James, MD, 150 mg at 04/03/17 0940  Patients Current Diet: Diet heart healthy/carb modified Room service appropriate? Yes; Fluid consistency: Thin Diet - low sodium heart healthy  Precautions / Restrictions Precautions Precautions: Fall Precaution Comments: Rankle bruising Restrictions Weight Bearing Restrictions: No   Has the patient had  2 or more falls or a fall with injury in the past year?Yes  Prior Activity Level Limited Community (1-2x/wk): Prior to admission patient was independent at home with intermittent use of a walker; however, she reported holding on the the furniture when she walked.  Additionally, she managed her meds and did the cooking.    Home Assistive Devices / Equipment Home Assistive Devices/Equipment: Medical laboratory scientific officer (specify quad or straight), Wheelchair Home Equipment: Walker - 4 wheels, Bedside commode, Shower seat - built in, [employer] - tub/shower, Hand held shower head  Prior Device Use: Indicate devices/aids used by the patient prior to current illness, exacerbation or injury? Walker, intermittently   Prior Functional Level Prior Function Level of Independence: Independent with assistive device(s) Comments: ambulated with RW, no assist with ADLs, iADLs   Self Care: Did the patient need help bathing, dressing, using the toilet or eating? Independent  Indoor Mobility: Did the patient need assistance with walking from room to room (with or without device)? Independent  Stairs: Did the patient need assistance with internal or external stairs (with or without device)? Independent  Functional Cognition: Did the patient need help planning regular tasks such as shopping or remembering to take medications? Independent  Current Functional Level Cognition  Arousal/Alertness: Awake/alert Overall Cognitive  Status: Impaired/Different from baseline Current Attention Level: Selective Orientation Level: Oriented X4 General Comments: pt with improved comprehension and sequencing Attention: Focused, Sustained Focused Attention: Appears intact Sustained Attention: Appears intact Memory: Impaired Memory Impairment: Decreased short term memory Decreased Short Term Memory: Verbal basic, Functional basic(pt did not recall OT having visited her this morning) Awareness: Appears intact    Extremity Assessment (includes Sensation/Coordination)  Upper Extremity Assessment: RUE deficits/detail RUE Deficits / Details: grossly 2/5, limited grip strength. Pt reports sensation intact RUE: Unable to fully assess due to pain RUE Coordination: decreased fine motor, decreased gross motor  Lower Extremity Assessment: Defer to PT evaluation RLE Deficits / Details: R knee and ankle ROM limited by pain s/p fall, difficult to assess hip ROM and strength due to increased knee pain with movement,   RLE: Unable to fully assess due to pain RLE Sensation: decreased light touch, decreased proprioception RLE Coordination: decreased fine motor, decreased gross motor    ADLs  Overall ADL's : Needs assistance/impaired Eating/Feeding: Moderate assistance, Sitting Grooming: Moderate assistance, Sitting Grooming Details (indicate cue type and reason): Pt having just finished brushing her teeth. Daughter and pt reporting she was able to sit in recliner and brush her teeth without much assistance.  Upper Body Bathing: Maximal assistance, Sitting Lower Body Bathing: Maximal assistance, +2 for physical assistance, Sit to/from stand Upper Body Dressing : Maximal assistance, Sitting Lower Body Dressing: Maximal assistance, +2 for physical assistance, Sit to/from stand Toilet Transfer: Maximal assistance, +2 for physical assistance, Stand-pivot, BSC Toilet Transfer Details (indicate cue type and reason): Simulated by transfer  EOB>chair Functional mobility during ADLs: Maximal assistance, Rolling walker, Cueing for sequencing(for stand pivot only) General ADL Comments: Focused session on RUE excercises to increase strength and FM skills. Provided handouts on FM excercises and theraputty.  Pt also performing lateral lean to L to scoot RLE toward edge of reclienr before sit<>stand; performed three times with Mod A to bring R hip forward.     Mobility  Overal bed mobility: Needs Assistance Bed Mobility: Rolling, Sit to Sidelying Rolling: Min assist Sidelying to sit: Mod assist Supine to sit: Mod assist Sit to supine: Max assist Sit to sidelying: Min assist General bed mobility comments: modA  for trunk elevation to sit EOB, encouraged pt to pull up with R hand    Transfers  Overall transfer level: Needs assistance Equipment used: Rolling walker (2 wheeled) Transfers: Sit to/from Stand Sit to Stand: Max assist Stand pivot transfers: Max assist, +2 physical assistance General transfer comment: maxAx1 to power up, R knee blocked, assist to achieve full upright posture and to push up from bed/chair    Ambulation / Gait / Stairs / Wheelchair Mobility  Ambulation/Gait Ambulation/Gait assistance: Max assist, +2 safety/equipment Ambulation Distance (Feet): 20 Feet(x1, 15x1) Assistive device: Rolling walker (2 wheeled) Gait Pattern/deviations: Step-to pattern, Decreased stride length, Decreased dorsiflexion - right, Decreased weight shift to right, Decreased weight shift to left, Antalgic General Gait Details: pt maxAx1, pt able to maintain upright posture better today, R UE con't to fatigue out requiring assist on R side to keep grip on walker. min/modA to advance R LE. pt with improved ability to advance R LE and clear foot however inconsistent, most likely due to fatigue. Pt with improved R LE WBing tolerance Gait velocity: slow    Posture / Balance Dynamic Sitting Balance Sitting balance - Comments: pt able to  tolerate sitting EOB with close supervision. pt remains to have slight R lateral lean but wasn't "falling over" Balance Overall balance assessment: Needs assistance Sitting-balance support: Feet supported Sitting balance-Leahy Scale: Fair Sitting balance - Comments: pt able to tolerate sitting EOB with close supervision. pt remains to have slight R lateral lean but wasn't "falling over" Postural control: Right lateral lean Standing balance-Leahy Scale: Poor Standing balance comment: max assist for standing balance    Special needs/care consideration BiPAP/CPAP: No CPM: No Continuous Drip IV: No Dialysis: No         Life Vest: No Oxygen: No Special Bed: No Trach Size: No Wound Vac (area): No       Skin: WDL                               Bowel mgmt: Continent, last BM 04/02/17 Bladder mgmt: Intermittent incontinence  Diabetic mgmt: Yes, managed with an insulin pen PTA     Previous Home Environment Living Arrangements: Spouse/significant other Available Help at Discharge: Family, Available 24 hours/day Type of Home: House Home Layout: One level Home Access: Stairs to enter Entrance Stairs-Rails: Can reach both Entrance Stairs-Number of Steps: 6 Bathroom Shower/Tub: Psychologist, counselling, Sport and exercise psychologist: Administrator Accessibility: Yes Home Care Services: No  Discharge Living Setting Plans for Discharge Living Setting: Patient's home, Lives with (comment)(Spouse) Type of Home at Discharge: House Discharge Home Layout: One level Discharge Home Access: Stairs to enter Entrance Stairs-Rails: Can reach both Entrance Stairs-Number of Steps: 6 Discharge Bathroom Shower/Tub: Tub/shower unit, Door Discharge Bathroom Toilet: Standard Discharge Bathroom Accessibility: No Does the patient have any problems obtaining your medications?: No  Social/Family/Support Systems Patient Roles: Spouse, Parent, Other (Comment)(Grandparent ) Contact Information: SpouseEphriam Knuckles  [phone]; Son: Lorna Few. [phone] Anticipated Caregiver: Spouse as he is able and discussed need for caregivers with patient and family  Anticipated Caregiver's Contact Information: see above Ability/Limitations of Caregiver: Spouse has cancer and can do limited assist  Caregiver Availability: 24/7 Discharge Plan Discussed with Primary Caregiver: Yes Is Caregiver In Agreement with Plan?: Yes Does Caregiver/Family have Issues with Lodging/Transportation while Pt is in Rehab?: No   Goals/Additional Needs Patient/Family Goal for Rehab: PT/OT: Min-Mod A; SLP: Mod I  Expected length of stay: 20-14 days  Cultural Considerations: None Dietary Needs: Carb. Mod. diet restrictions  Equipment Needs: TBD Special Service Needs: None Additional Information: Family will need home care resources  Pt/Family Agrees to Admission and willing to participate: Yes Program Orientation Provided & Reviewed with Pt/Caregiver Including Roles  & Responsibilities: Yes Additional Information Needs: Family will need home care resources  Information Needs to be Provided By: CSW  Barriers to Discharge: Lack of/limited family support   Decrease burden of Care through IP rehab admission: No  Possible need for SNF placement upon discharge: No  Patient Condition: This patient's condition remains as documented in the consult dated 04/02/17, in which the Rehabilitation Physician determined and documented that the patient's condition is appropriate for intensive rehabilitative care in an inpatient rehabilitation facility. Will admit to inpatient rehab today.  Preadmission Screen Completed By:  Fae Pippin, 04/03/2017 12:46 PM ______________________________________________________________________   Discussed status with Dr. Riley Kill on 04/03/17 at 1258 and received telephone approval for admission today.  Admission Coordinator:  Fae Pippin, time 1258/Date 04/03/17

## 2017-04-03 NOTE — Progress Notes (Signed)
ANTICOAGULATION CONSULT NOTE -Follow up  Pharmacy Consult for Continuation of Apixaban Indication: atrial fibrillation   Allergies  Allergen Reactions  . Doxycycline Hyclate Nausea And Vomiting  . Morphine Sulfate Other (See Comments)    REACTION: hallucinations  . Iohexol Hives     Code: HIVES, Desc: pt. states she breaks out in hives 06/15/08   . Moxifloxacin Other (See Comments)    REACTION: unknown    Vital Signs: Temp: 97.6 F (36.4 C) (11/27 1406) Temp Source: Oral (11/27 1406) BP: 136/59 (11/27 1406) Pulse Rate: 69 (11/27 1406)  Labs: No results for input(s): HGB, HCT, PLT, APTT, LABPROT, INR, HEPARINUNFRC, HEPRLOWMOCWT, CREATININE, CKTOTAL, CKMB, TROPONINI in the last 72 hours.  Estimated Creatinine Clearance: 47 mL/min (by C-G formula based on SCr of 0.84 mg/dL).  Medical History: Past Medical History:  Diagnosis Date  . ASTHMA 07/15/2008  . COPD (chronic obstructive pulmonary disease) (HCC)   . DIABETES-TYPE 2 07/15/2008  . DIABETIC PERIPHERAL NEUROPATHY 09/02/2009  . GERD 07/15/2008  . HYPERTENSION 07/15/2008  . Normal cardiac stress test    low risk Nuc 2009, Feb 2011  . Paroxysmal atrial fibrillation (HCC)   . PVD (peripheral vascular disease) (HCC) 06/18/09   RCE  . Stroke (HCC)   . Vertigo     Assessment: 82 y/o F presented on 03/29/17 with worsening right sided weakness and dysarthria. On apixaban PTA for afib. She did not receive IV t-PA due to on Eliquis pta.  Patient reported compliant with Eliquis.   --CT head with no acute changes. --MRI  - Small acute infarct of the left pons, multiple remote infarcts. --MRA head -multifocal intracranial stenosis including high-grade left and tandem stenosis of bilateral PCAs She was continued on apixaban 5 mg po BID per Neuro as size of stroke was small.  Neuro added ASA 81 mg daily.   Last labs were on 11/23 SCr was wnl and 11/22 CBC wnl.  No signs of bleeding noted.  82 y.o , wt 65.3 kg, Scr wnl--current  apixaban dose 5mg  bid remains appropriate.  Goal of Therapy:  Monitor platelets by anticoagulation protocol: Yes   Plan:  Continue Apixaban 5mg  BID- no change needed Consider weekly CBC- check on 11/29 Monitor for bleeding  Thank you for allowing pharmacy to be part of this patients care team. Noah Delaineuth Mckenleigh Tarlton, RPh Clinical Pharmacist [phone]-4P [phone] [facility] [phone] 04/03/2017,2:41 PM

## 2017-04-03 NOTE — Progress Notes (Signed)
Orthopedic Tech Progress Note Patient Details:  Atilano MedianKay F Pevehouse Dec 11, 1934 [id number]  Ortho Devices Type of Ortho Device: Postop shoe/boot Ortho Device/Splint Location: RLE prafo boot Ortho Device/Splint Interventions: Ordered, Application   Jennye MoccasinHughes, Madylyn Insco Craig 04/03/2017, 10:36 PM

## 2017-04-03 NOTE — Progress Notes (Signed)
Physical Therapy Treatment Patient Details Name: Casey Harrell MRN: [id number] DOB: [birth year] Today's Date: 04/03/2017    History of Present Illness 82 y.o. female white female with past medical history of CVA with residual right hemiparesis, diabetes, hypertension, COPD, atrial fibrillation on Eliquis, peripheral vascular disease presents with right-sided weakness and her speech since 8 AM this morning.Was admitted to [facility] 03/31/2015 for agitation and increased confusion.  MRI showed small patchy left MCA infarcts with remote bilateral occipital infarcts, embolic pattern.  Workup during that admission revealed the patient also had intracranial atherosclerotic disease and was placed on dual antiplatelets.     PT Comments    Pt much improved from yesterday. Pt with improved sitting balance, ambulation tolerance, and improved ability to advance R LE and tolerate WBing. Pt con't to remain appropriate for CIR upon d/c.   Follow Up Recommendations  CIR     Equipment Recommendations       Recommendations for Other Services       Precautions / Restrictions Precautions Precautions: Fall Precaution Comments: Rankle bruising Restrictions Weight Bearing Restrictions: No    Mobility  Bed Mobility Overal bed mobility: Needs Assistance Bed Mobility: Rolling;Sit to Sidelying     Supine to sit: Mod assist     General bed mobility comments: modA for trunk elevation to sit EOB, encouraged pt to pull up with R hand  Transfers Overall transfer level: Needs assistance Equipment used: Rolling walker (2 wheeled) Transfers: Sit to/from Stand Sit to Stand: Max assist         General transfer comment: maxAx1 to power up, R knee blocked, assist to achieve full upright posture and to push up from bed/chair  Ambulation/Gait Ambulation/Gait assistance: Max assist;+2 safety/equipment Ambulation Distance (Feet): 20 Feet(x1, 15x1) Assistive device: Rolling walker (2 wheeled) Gait  Pattern/deviations: Step-to pattern;Decreased stride length;Decreased dorsiflexion - right;Decreased weight shift to right;Decreased weight shift to left;Antalgic Gait velocity: slow   General Gait Details: pt maxAx1, pt able to maintain upright posture better today, R UE con't to fatigue out requiring assist on R side to keep grip on walker. min/modA to advance R LE. pt with improved ability to advance R LE and clear foot however inconsistent, most likely due to fatigue. Pt with improved R LE WBing tolerance   Stairs            Wheelchair Mobility    Modified Rankin (Stroke Patients Only) Modified Rankin (Stroke Patients Only) Pre-Morbid Rankin Score: No symptoms Modified Rankin: Severe disability     Balance Overall balance assessment: Needs assistance Sitting-balance support: Feet supported Sitting balance-Leahy Scale: Fair Sitting balance - Comments: pt able to tolerate sitting EOB with close supervision. pt remains to have slight R lateral lean but wasn't "falling over" Postural control: Right lateral lean   Standing balance-Leahy Scale: Poor Standing balance comment: max assist for standing balance                            Cognition Arousal/Alertness: Awake/alert Behavior During Therapy: WFL for tasks assessed/performed Overall Cognitive Status: Impaired/Different from baseline Area of Impairment: Problem solving                   Current Attention Level: Selective         Problem Solving: Slow processing;Decreased initiation;Difficulty sequencing;Requires verbal cues;Requires tactile cues General Comments: pt with improved comprehension and sequencing      Exercises      General Comments  Pertinent Vitals/Pain Pain Assessment: Faces Faces Pain Scale: Hurts little more Pain Location: R foot Pain Descriptors / Indicators: Grimacing;Guarding Pain Intervention(s): Monitored during session    Home Living                       Prior Function            PT Goals (current goals can now be found in the care plan section) Acute Rehab PT Goals Patient Stated Goal: return to PLOF Progress towards PT goals: Progressing toward goals    Frequency    Min 4X/week      PT Plan Current plan remains appropriate    Co-evaluation              AM-PAC PT "6 Clicks" Daily Activity  Outcome Measure  Difficulty turning over in bed (including adjusting bedclothes, sheets and blankets)?: Unable Difficulty moving from lying on back to sitting on the side of the bed? : Unable Difficulty sitting down on and standing up from a chair with arms (e.g., wheelchair, bedside commode, etc,.)?: Unable Help needed moving to and from a bed to chair (including a wheelchair)?: Total Help needed walking in hospital room?: Total Help needed climbing 3-5 steps with a railing? : Total 6 Click Score: 6    End of Session Equipment Utilized During Treatment: Gait belt Activity Tolerance: Patient tolerated treatment well Patient left: in chair;with call bell/phone within reach;with chair alarm set;with family/visitor present Nurse Communication: Mobility status PT Visit Diagnosis: Hemiplegia and hemiparesis Hemiplegia - Right/Left: Right Hemiplegia - dominant/non-dominant: Dominant Hemiplegia - caused by: Cerebral infarction Pain - Right/Left: Right     Time: 3244-0102 PT Time Calculation (min) (ACUTE ONLY): 20 min  Charges:  $Gait Training: 8-22 mins                    G Codes:       Lewis Shock, PT, DPT Pager #: [phone] Office #: [phone]    Deeya Richeson M Tami Blass 04/03/2017, 12:32 PM

## 2017-04-03 NOTE — Progress Notes (Signed)
Received Pt. As a transfer from [facility].Pt. Has been oriented to the unit routine and protocol.Safety plan has been explained.Fall prevention plan was explained.

## 2017-04-03 NOTE — Care Management Note (Signed)
Case Management Note  Patient Details  Name: Casey Harrell MRN: [id number] Date of Birth: Nov 25, 1934  Subjective/Objective:                    Action/Plan: Pt discharging to [facility] today. No further needs per CM.  Expected Discharge Date:  04/03/17               Expected Discharge Plan:  IP Rehab Facility  In-House Referral:     Discharge planning Services  CM Consult  Post Acute Care Choice:    Choice offered to:     DME Arranged:    DME Agency:     HH Arranged:    HH Agency:     Status of Service:  Completed, signed off  If discussed at [employer] Meetings, dates discussed:    Additional Comments:  Kermit BaloKelli F Ravis Herne, RN 04/03/2017, 12:10 PM

## 2017-04-03 NOTE — Discharge Summary (Addendum)
Physician Discharge Summary  Atilano MedianKay F Nase IHK:[id number] DOB: Apr 12, 1935 DOA: 03/29/2017  PCP: Kristian CoveyBurchette, Bruce W, MD  Admit date: 03/29/2017 Discharge date: 04/03/2017  Admitted From: Home Disposition:  Inpatient rehab or SNF  Recommendations for Outpatient Follow-up:  1. Follow up with PCP in 1-week 2. Patient has been placed on amlodipine for better blood pressure control 3. Aspirin added to medical regimen 4. Local analgesia to right ankle with topical Voltaren  Home Health: no  Equipment/Devices: No   Discharge Condition: Stable CODE STATUS: Full  Diet recommendation: Heart healthy and diabetic prudent  Brief/Interim Summary: 82 year old female who presents with right sided weakness. Patient does have the significant past medical history for hypertension, dyslipidemia, type 2 diabetes mellitus, and paroxysmal atrial fibrillation with a prior CVA. She presents with 24-hour history of weakness in her right lower extremity, associated with difficulty speaking, and dysphagia. On the initial physical examination blood pressure 130/58, heart rate 82, temperature 98.2, respiratory 23, oxygen saturation 95%. Moist mucous membranes, lungs clear to auscultation bilaterally, heart S1-S2 present rhythmic, abdomen soft nontender, no lower extremity edema, right ankle tenderness and local edema. Sodium 136, potassium 4.1, chloride 102, bicarbonate 22, glucose 259, BUN 33, creatinine 1.0, white count 14.7, hemoglobin 11.6, hematocrit 35.5, platelets 258, urinalysis negative for infection, urine drug screen negative. Head CT with no acute intracranial changes. Chronic atrophy and small vessel ischemic changes, multifocal old infarcts. Chest x-ray, hypoinflated, left rotation, no infiltrates.   Patient was admitted to the hospital with the working diagnosis of focal neurologic deficit to rule out CVA.  1. Acute ischemic CVA, small acute infarct at the left pons, 8x3 mm. Patient was admitted to the  medical ward, she was placed on a remote telemetry monitor, frequent neuro checks. She was placed on antiplatelet therapy with aspirin, continue anticoagulation with apixaban and statin with atorvastatin. She had persistent right hemiparesis, predominantly on the right lower extremity. Further workup included an MRA which showed moderate severe stenosis of the supraclinoid right ICA, no occlusion or high-grade stenosis of the intracranial arch vessels. Echocardiogram with normal LV function. Patient was seen by physical therapy and speech therapy, recommendations to continue rehabilitation at inpatient rehabilitation.   2. Paroxysmal atrial fibrillation. Remained well-rate controlled with carvedilol, continue anticoagulation with apixaban, aspirin was added to her medical regimen.  3. Hypertension. Blood pressure was noted to be elevated patient was continued on irbesartan and carvedilol, amlodipine was added to improve blood pressure control. Target outpatient blood pressure 130/80.   4. Type 2 diabetes mellitus with hyperglycemia. Patient was placed on insulin sliding scale for glucose coverage and monitoring, her capillary glucose remained elevated, she was resumed on her long-acting insulin, the dose had been progressively adjusted, currently on 30 units twice daily. Patient will resume metformin at discharge.  5. Hypothyroidism. Continue levothyroxine  6. Anemia. Multifactorial, remained stable.  7. Hypothyroidism. Continue levothyroxine.  8. Depression. Continue duloxetine, no agitation noticed.  9. Traumatic right ankle edema/ unspecified right ankle injury . X-rays noted no fracture, patient improving with topical analgesics with Voltaren. Continue physical therapy.  10. CKD stage 2. Patient's renal function was followed renal function remained stable with serum cr 1,2 to 0.9 with a calculated GFR of 79.    Discharge Diagnoses:  Principal Problem:   Stroke [facility](HCC) Active Problems:    Hypothyroidism   Type 2 diabetes mellitus, uncontrolled (HCC)   Paroxysmal atrial fibrillation (HCC)   Anemia   History of stroke   History of CVA with residual deficit  Type 2 diabetes mellitus with peripheral neuropathy [facility](HCC)    Discharge Instructions  Discharge Instructions    Ambulatory referral to Neurology   Complete by:  As directed    Pt [facility]. Thanks.     Allergies as of 04/03/2017      Reactions   Doxycycline Hyclate Nausea And Vomiting   Morphine Sulfate Other (See Comments)   REACTION: hallucinations   Iohexol Hives    Code: HIVES, Desc: pt. states she breaks out in hives 06/15/08   Moxifloxacin Other (See Comments)   REACTION: unknown      Medication List    TAKE these medications   acetaminophen 325 MG tablet Commonly known as:  TYLENOL Take 650 mg by mouth every 6 (six) hours as needed (pain).   albuterol 1.25 MG/3ML nebulizer solution Commonly known as:  ACCUNEB Take 3 mLs (1.25 mg total) by nebulization every 4 (four) hours as needed for wheezing. Reported on 04/26/2015   amLODipine 5 MG tablet Commonly known as:  NORVASC Take 1 tablet (5 mg total) by mouth daily. Start taking on:  04/04/2017   apixaban 5 MG Tabs tablet Commonly known as:  ELIQUIS TAKE 1 TABLET(5 MG) BY MOUTH TWICE DAILY   aspirin 81 MG EC tablet Take 1 tablet (81 mg total) by mouth daily. Start taking on:  04/04/2017   atorvastatin 40 MG tablet Commonly known as:  LIPITOR Take 1 tablet (40 mg total) by mouth daily. Start taking on:  04/04/2017   CALCIUM PO Take 1 tablet by mouth daily.   carvedilol 6.25 MG tablet Commonly known as:  COREG TAKE 1 TABLET BY MOUTH TWICE A DAY WITH A MEAL   diclofenac sodium 1 % Gel Commonly known as:  VOLTAREN Apply 2 g topically 4 (four) times daily. Apply 2 g topically to right ankle, four times daily.   DULoxetine 30 MG capsule Commonly known as:  CYMBALTA TAKE 1 CAPSULE BY MOUTH  EVERY DAY FOR 2 WEEKS, THEN INCREASE TO 1 CAPSULE TWICE A DAY What changed:  See the new instructions.   HUMALOG KWIKPEN 100 UNIT/ML KiwkPen Generic drug:  insulin lispro INJECT 9-14 UNITS SUBCUTANEOUSLY TWICE DAILY WITH MEAL 9 UNITS BREAKFAST AND 14 UNITS AFTER SUPPER   ipratropium-albuterol 0.5-2.5 (3) MG/3ML Soln Commonly known as:  DUONEB Take 3 mLs by nebulization every 6 (six) hours as needed (shortness of breath or wheezing).   irbesartan 150 MG tablet Commonly known as:  AVAPRO TAKE 1 TABLET BY MOUTH EVERY DAY   LEVEMIR FLEXTOUCH 100 UNIT/ML Pen Generic drug:  Insulin Detemir INJECT 40 UNITS INTO THE SKIN TWICE A DAY What changed:  See the new instructions.   levothyroxine 50 MCG tablet Commonly known as:  SYNTHROID, LEVOTHROID TAKE 1 TABLET BY MOUTH EVERY DAY BEFORE BREAKFAST   LYRICA 150 MG capsule Generic drug:  pregabalin TAKE ONE CAPSULE BY MOUTH EVERY DAY   meclizine 25 MG tablet Commonly known as:  ANTIVERT Take 1 tablet (25 mg total) by mouth 3 (three) times daily as needed for dizziness.   metFORMIN 1000 MG tablet Commonly known as:  GLUCOPHAGE TAKE 1 TABLET BY MOUTH TWICE A DAY WITH A MEAL   ondansetron 4 MG disintegrating tablet Commonly known as:  ZOFRAN ODT Take 1 tablet (4 mg total) by mouth every 8 (eight) hours as needed for nausea or vomiting.   pantoprazole 40 MG tablet Commonly known as:  PROTONIX TAKE 1 TABLET BY MOUTH  TWICE A DAY   Vitamin D3 2000 units Tabs Take 2,000 Units by mouth daily. Reported on 04/26/2015      Follow-up Information    Marvel Plan, MD. Schedule an appointment as soon as possible for a visit in 6 week(s).   Specialty:  Neurology Contact information: [address] [place] [address] [phone]          Allergies  Allergen Reactions  . Doxycycline Hyclate Nausea And Vomiting  . Morphine Sulfate Other (See Comments)    REACTION: hallucinations  . Iohexol Hives     Code: HIVES,  Desc: pt. states she breaks out in hives 06/15/08   . Moxifloxacin Other (See Comments)    REACTION: unknown    Consultations:  Neurology    Procedures/Studies: Dg Chest 2 View  Result Date: 03/30/2017 CLINICAL DATA:  Weakness. EXAM: CHEST  2 VIEW COMPARISON:  Radiographs and CT 11/09/2016 FINDINGS: Unchanged heart size and mediastinal contours. No pulmonary edema, consolidation or pleural fluid. Mild chronic bronchial thickening, stable. No pneumothorax. No acute osseous abnormality. IMPRESSION: No acute abnormality.  Chronic bronchial thickening. Electronically Signed   By: Rubye Oaks M.D.   On: 03/30/2017 00:58   Dg Ankle Complete Right  Result Date: 03/29/2017 CLINICAL DATA:  Right ankle pain after fall. EXAM: RIGHT ANKLE - COMPLETE 3+ VIEW COMPARISON:  None. FINDINGS: There is moderate soft tissue swelling over the lateral malleolus. A linear lucency is seen involving the metaphysis of the distal fibula on one view only and is believed to be secondary to overlap with the tibia. No significant joint effusion. The tibiotalar, subtalar and midfoot articulations are maintained. There are nonspecific subcutaneous soft tissue calcifications along the plantar aspect of the hindfoot possibly related prior injection for plantar fasciitis versus calcified fibromas/ plantar calcified fibromatosis. IMPRESSION: 1. Soft tissue swelling over the lateral malleolus. No acute fracture nor dislocations identified. 2. Coarse subcutaneous soft tissue calcifications along the plantar aspect of the hindfoot, nonspecific in etiology. Electronically Signed   By: Tollie Eth M.D.   On: 03/29/2017 23:26   Ct Head Wo Contrast  Result Date: 03/29/2017 CLINICAL DATA:  Focal neurological deficit for greater than 6 hours. Progressive weakness over the day. Losing control of fine motor skills. History of stroke with right-sided weakness. EXAM: CT HEAD WITHOUT CONTRAST TECHNIQUE: Contiguous axial images were  obtained from the base of the skull through the vertex without intravenous contrast. COMPARISON:  MRI brain 06/20/2015.  CT head 06/20/2015 FINDINGS: Brain: Diffuse cerebral atrophy. Ventricular dilatation consistent with central atrophy. Low-attenuation changes throughout the deep white matter consistent small vessel ischemia. Multiple old lacunar infarcts seen bilaterally. Focal encephalomalacia in the occipital lobes bilaterally and in the left parietal lobe consistent with old infarcts. No significant changes since previous study. No mass-effect or midline shift. No abnormal extra-axial fluid collections. Gray-white matter junctions are distinct. Basal cisterns are not effaced. No acute intracranial hemorrhage. Vascular: Intracranial arterial vascular calcifications are present. Skull: Calvarium appears intact. Sinuses/Orbits: Mucosal thickening in the paranasal sinuses. No acute air-fluid levels. Mastoid air cells are clear. Other: None. IMPRESSION: No acute intracranial abnormalities. Chronic atrophy and small vessel ischemic changes. Multifocal old infarcts. No change since prior study. Electronically Signed   By: Burman Nieves M.D.   On: 03/29/2017 23:37   Mr Maxine Glenn Neck Wo Contrast  Result Date: 03/30/2017 CLINICAL DATA:  Right leg weakness.  Slurred speech. EXAM: MRI HEAD WITHOUT CONTRAST MRA HEAD WITHOUT CONTRAST MRA NECK WITHOUT CONTRAST TECHNIQUE: Multiplanar, multiecho  pulse sequences of the brain and surrounding structures were obtained without intravenous contrast. Angiographic images of the Circle of Willis were obtained using MRA technique without intravenous contrast. Angiographic images of the neck were obtained using MRA technique without intravenous contrast. Carotid stenosis measurements (when applicable) are obtained utilizing NASCET criteria, using the distal internal carotid diameter as the denominator. COMPARISON:  Head CT 03/29/2017 Brain MRI 06/20/2015 FINDINGS: MRI HEAD FINDINGS  Brain: The midline structures are normal. There is a small focus of abnormal diffusion restriction within the left pons. No other abnormal diffusion restriction. Old bilateral simple infarcts. Multiple old bilateral gangliothalamic lacunar infarcts. There is diffuse confluent hyperintense T2-weighted signal within the periventricular, juxtacortical and deep white matter, most often seen in the setting of chronic microvascular ischemia. No mass lesion. Remote microhemorrhage within the left cerebellum. No other acute or chronic hemorrhage. No hydrocephalus, age advanced atrophy or lobar predominant volume loss. No dural abnormality or extra-axial collection. Skull and upper cervical spine: The visualized skull base, calvarium, upper cervical spine and extracranial soft tissues are normal. Sinuses/Orbits: No fluid levels or advanced mucosal thickening. No mastoid effusion. Normal orbits. MRA HEAD FINDINGS The time-of-flight MRA images are degraded by motion, which limits assessment of vascular detail. Intracranial internal carotid arteries: Moderate to severe stenosis of the distal supraclinoid right ICA. Normal left ICA. Anterior cerebral arteries: Normal. Middle cerebral arteries: Normal. Posterior communicating arteries: Present on the right. Posterior cerebral arteries: Patent bilaterally. Basilar artery: Multifocal atherosclerotic irregularity. Vertebral arteries: Left dominant. Normal. Superior cerebellar arteries: Normal. Anterior inferior cerebellar arteries: Not clearly visualized. Posterior inferior cerebellar arteries: Normal on the right, not clearly seen on the left. It appears that the left PICA arises from the right PICA, which is a common variant. MRA NECK FINDINGS Motion degraded time-of-flight MRA of the neck. Aortic arch: Aortic arch is below the limit of the field of view. The visualized subclavian arteries are normal. Right carotid system:  No high-grade stenosis. Left carotid system: No  high-grade stenosis. Vertebral arteries: Right-dominant. Vertebral artery origins are normal on the right but poorly visualized on the left. There are areas of flow related enhancement loss along the course of the V2 segment of the left vertebral artery. IMPRESSION: 1. Small acute infarct of the left pons measuring approximately 8 x 3 mm. No associated hemorrhage or mass effect. This is in keeping with reported right-sided weakness. 2. Multiple old cortical and gangliothalamic infarcts superimposed on advanced chronic ischemic microangiopathy. 3. Motion degraded MRA of the head. There is moderate to severe stenosis of the supraclinoid right ICA. Otherwise, no occlusion or high-grade stenosis of the intracranial arch vessels. 4. Motion degraded MRA of the neck. No hemodynamically significant internal carotid artery stenosis. Areas of loss of flow related enhancement of the left vertebral artery V2 segment may be artifactual. MRA of the neck with contrast or CTA of the neck might be helpful for further evaluation. Electronically Signed   By: Deatra Robinson M.D.   On: 03/30/2017 03:47   Mr Brain Wo Contrast (neuro Protocol)  Result Date: 03/30/2017 CLINICAL DATA:  Right leg weakness.  Slurred speech. EXAM: MRI HEAD WITHOUT CONTRAST MRA HEAD WITHOUT CONTRAST MRA NECK WITHOUT CONTRAST TECHNIQUE: Multiplanar, multiecho pulse sequences of the brain and surrounding structures were obtained without intravenous contrast. Angiographic images of the Circle of Willis were obtained using MRA technique without intravenous contrast. Angiographic images of the neck were obtained using MRA technique without intravenous contrast. Carotid stenosis measurements (when applicable) are obtained utilizing NASCET  criteria, using the distal internal carotid diameter as the denominator. COMPARISON:  Head CT 03/29/2017 Brain MRI 06/20/2015 FINDINGS: MRI HEAD FINDINGS Brain: The midline structures are normal. There is a small focus of  abnormal diffusion restriction within the left pons. No other abnormal diffusion restriction. Old bilateral simple infarcts. Multiple old bilateral gangliothalamic lacunar infarcts. There is diffuse confluent hyperintense T2-weighted signal within the periventricular, juxtacortical and deep white matter, most often seen in the setting of chronic microvascular ischemia. No mass lesion. Remote microhemorrhage within the left cerebellum. No other acute or chronic hemorrhage. No hydrocephalus, age advanced atrophy or lobar predominant volume loss. No dural abnormality or extra-axial collection. Skull and upper cervical spine: The visualized skull base, calvarium, upper cervical spine and extracranial soft tissues are normal. Sinuses/Orbits: No fluid levels or advanced mucosal thickening. No mastoid effusion. Normal orbits. MRA HEAD FINDINGS The time-of-flight MRA images are degraded by motion, which limits assessment of vascular detail. Intracranial internal carotid arteries: Moderate to severe stenosis of the distal supraclinoid right ICA. Normal left ICA. Anterior cerebral arteries: Normal. Middle cerebral arteries: Normal. Posterior communicating arteries: Present on the right. Posterior cerebral arteries: Patent bilaterally. Basilar artery: Multifocal atherosclerotic irregularity. Vertebral arteries: Left dominant. Normal. Superior cerebellar arteries: Normal. Anterior inferior cerebellar arteries: Not clearly visualized. Posterior inferior cerebellar arteries: Normal on the right, not clearly seen on the left. It appears that the left PICA arises from the right PICA, which is a common variant. MRA NECK FINDINGS Motion degraded time-of-flight MRA of the neck. Aortic arch: Aortic arch is below the limit of the field of view. The visualized subclavian arteries are normal. Right carotid system:  No high-grade stenosis. Left carotid system: No high-grade stenosis. Vertebral arteries: Right-dominant. Vertebral artery  origins are normal on the right but poorly visualized on the left. There are areas of flow related enhancement loss along the course of the V2 segment of the left vertebral artery. IMPRESSION: 1. Small acute infarct of the left pons measuring approximately 8 x 3 mm. No associated hemorrhage or mass effect. This is in keeping with reported right-sided weakness. 2. Multiple old cortical and gangliothalamic infarcts superimposed on advanced chronic ischemic microangiopathy. 3. Motion degraded MRA of the head. There is moderate to severe stenosis of the supraclinoid right ICA. Otherwise, no occlusion or high-grade stenosis of the intracranial arch vessels. 4. Motion degraded MRA of the neck. No hemodynamically significant internal carotid artery stenosis. Areas of loss of flow related enhancement of the left vertebral artery V2 segment may be artifactual. MRA of the neck with contrast or CTA of the neck might be helpful for further evaluation. Electronically Signed   By: Deatra Robinson M.D.   On: 03/30/2017 03:47   Dg Foot Complete Right  Result Date: 03/29/2017 CLINICAL DATA:  Right foot pain and weakness. EXAM: RIGHT FOOT COMPLETE - 3+ VIEW COMPARISON:  None. FINDINGS: Soft tissue calcifications are seen along the plantar aspect of the hindfoot could potentially represent calcified fibromatosis along the plantar fascia versus stigmata of prior subcutaneous injection for plantar fasciitis or dystrophic calcifications from prior surgical intervention. No acute fractures noted. Suggesting of small extra-articular erosion or cortical defect along the lateral aspect of the fifth metatarsal head -neck. No acute joint dislocation nor parrot fracture. IMPRESSION: 1. Negative for acute osseous abnormality. 2. Dystrophic calcifications along the plantar aspect of the hindfoot with some differential possibilities that may include soft tissue calcifications from prior surgical intervention, prior injection for plantar  fasciitis or calcified plantar fibromatosis among  some possibilities though not exclusive. Electronically Signed   By: Tollie Eth M.D.   On: 03/29/2017 23:29   Mr Maxine Glenn Head (cerebral Arteries)  Result Date: 03/30/2017 CLINICAL DATA:  Right leg weakness.  Slurred speech. EXAM: MRI HEAD WITHOUT CONTRAST MRA HEAD WITHOUT CONTRAST MRA NECK WITHOUT CONTRAST TECHNIQUE: Multiplanar, multiecho pulse sequences of the brain and surrounding structures were obtained without intravenous contrast. Angiographic images of the Circle of Willis were obtained using MRA technique without intravenous contrast. Angiographic images of the neck were obtained using MRA technique without intravenous contrast. Carotid stenosis measurements (when applicable) are obtained utilizing NASCET criteria, using the distal internal carotid diameter as the denominator. COMPARISON:  Head CT 03/29/2017 Brain MRI 06/20/2015 FINDINGS: MRI HEAD FINDINGS Brain: The midline structures are normal. There is a small focus of abnormal diffusion restriction within the left pons. No other abnormal diffusion restriction. Old bilateral simple infarcts. Multiple old bilateral gangliothalamic lacunar infarcts. There is diffuse confluent hyperintense T2-weighted signal within the periventricular, juxtacortical and deep white matter, most often seen in the setting of chronic microvascular ischemia. No mass lesion. Remote microhemorrhage within the left cerebellum. No other acute or chronic hemorrhage. No hydrocephalus, age advanced atrophy or lobar predominant volume loss. No dural abnormality or extra-axial collection. Skull and upper cervical spine: The visualized skull base, calvarium, upper cervical spine and extracranial soft tissues are normal. Sinuses/Orbits: No fluid levels or advanced mucosal thickening. No mastoid effusion. Normal orbits. MRA HEAD FINDINGS The time-of-flight MRA images are degraded by motion, which limits assessment of vascular detail.  Intracranial internal carotid arteries: Moderate to severe stenosis of the distal supraclinoid right ICA. Normal left ICA. Anterior cerebral arteries: Normal. Middle cerebral arteries: Normal. Posterior communicating arteries: Present on the right. Posterior cerebral arteries: Patent bilaterally. Basilar artery: Multifocal atherosclerotic irregularity. Vertebral arteries: Left dominant. Normal. Superior cerebellar arteries: Normal. Anterior inferior cerebellar arteries: Not clearly visualized. Posterior inferior cerebellar arteries: Normal on the right, not clearly seen on the left. It appears that the left PICA arises from the right PICA, which is a common variant. MRA NECK FINDINGS Motion degraded time-of-flight MRA of the neck. Aortic arch: Aortic arch is below the limit of the field of view. The visualized subclavian arteries are normal. Right carotid system:  No high-grade stenosis. Left carotid system: No high-grade stenosis. Vertebral arteries: Right-dominant. Vertebral artery origins are normal on the right but poorly visualized on the left. There are areas of flow related enhancement loss along the course of the V2 segment of the left vertebral artery. IMPRESSION: 1. Small acute infarct of the left pons measuring approximately 8 x 3 mm. No associated hemorrhage or mass effect. This is in keeping with reported right-sided weakness. 2. Multiple old cortical and gangliothalamic infarcts superimposed on advanced chronic ischemic microangiopathy. 3. Motion degraded MRA of the head. There is moderate to severe stenosis of the supraclinoid right ICA. Otherwise, no occlusion or high-grade stenosis of the intracranial arch vessels. 4. Motion degraded MRA of the neck. No hemodynamically significant internal carotid artery stenosis. Areas of loss of flow related enhancement of the left vertebral artery V2 segment may be artifactual. MRA of the neck with contrast or CTA of the neck might be helpful for further  evaluation. Electronically Signed   By: Deatra Robinson M.D.   On: 03/30/2017 03:47       Subjective: Patient feeling well, persistent right lower extremity weakness, no nausea or vomiting, no chest pain. Right ankle pain is improved, decreased local edema.  Discharge Exam: Vitals:   04/03/17 0506 04/03/17 0911  BP: (!) 167/66 (!) 160/59  Pulse: 66 76  Resp: 20 20  Temp: 98.7 F (37.1 C) 97.9 F (36.6 C)  SpO2: 97% 94%   Vitals:   04/02/17 2149 04/03/17 0056 04/03/17 0506 04/03/17 0911  BP: (!) 156/51 (!) 163/81 (!) 167/66 (!) 160/59  Pulse: 65 71 66 76  Resp: 20 20 20 20   Temp: 98.3 F (36.8 C) 97.7 F (36.5 C) 98.7 F (37.1 C) 97.9 F (36.6 C)  TempSrc: Oral Oral Oral Oral  SpO2: 98% 96% 97% 94%  Weight:      Height:        General: Pt is alert, awake, not in acute distress, deconditioned E ENT: mild pallor with no icterus Cardiovascular: RRR, S1/S2 +, no rubs, no gallops Respiratory: CTA bilaterally, no wheezing, no rhonchi Abdominal: Soft, NT, ND, bowel sounds + Extremities: no edema, no cyanosis/ improved right ankle edema Neurology. Paresis of the right lower extremity, right upper extremity with resuce strength 4/5.     The results of significant diagnostics from this hospitalization (including imaging, microbiology, ancillary and laboratory) are listed below for reference.     Microbiology: Recent Results (from the past 240 hour(s))  Urine culture     Status: Abnormal   Collection Time: 03/30/17  2:00 AM  Result Value Ref Range Status   Specimen Description URINE, CLEAN CATCH  Final   Special Requests NONE  Final   Culture 70,000 COLONIES/mL ESCHERICHIA COLI (A)  Final   Report Status 04/01/2017 FINAL  Final   Organism ID, Bacteria ESCHERICHIA COLI (A)  Final      Susceptibility   Escherichia coli - MIC*    AMPICILLIN <=2 SENSITIVE Sensitive     CEFAZOLIN <=4 SENSITIVE Sensitive     CEFTRIAXONE <=1 SENSITIVE Sensitive     CIPROFLOXACIN <=0.25  SENSITIVE Sensitive     GENTAMICIN <=1 SENSITIVE Sensitive     IMIPENEM <=0.25 SENSITIVE Sensitive     NITROFURANTOIN <=16 SENSITIVE Sensitive     TRIMETH/SULFA >=320 RESISTANT Resistant     AMPICILLIN/SULBACTAM <=2 SENSITIVE Sensitive     PIP/TAZO <=4 SENSITIVE Sensitive     Extended ESBL NEGATIVE Sensitive     * 70,000 COLONIES/mL ESCHERICHIA COLI     Labs: BNP (last 3 results) No results for input(s): BNP in the last 8760 hours. Basic Metabolic Panel: Recent Labs  Lab 03/29/17 2224 03/29/17 2243 03/30/17 1001  NA 135 136 135  K 4.1 4.1 3.7  CL 101 102 101  CO2 22  --  25  GLUCOSE 269* 259* 279*  BUN 28* 33* 19  CREATININE 1.18* 1.00 0.84  CALCIUM 9.1  --  9.0   Liver Function Tests: Recent Labs  Lab 03/29/17 2224 03/30/17 1001  AST 22 16  ALT 12* 10*  ALKPHOS 66 69  BILITOT 0.7 0.7  PROT 6.9 6.6  ALBUMIN 3.7 3.4*   No results for input(s): LIPASE, AMYLASE in the last 168 hours. No results for input(s): AMMONIA in the last 168 hours. CBC: Recent Labs  Lab 03/29/17 2224 03/29/17 2243  WBC 14.7*  --   NEUTROABS 9.7*  --   HGB 11.6* 13.6  HCT 35.5* 40.0  MCV 84.9  --   PLT 258  --    Cardiac Enzymes: Recent Labs  Lab 03/30/17 0419 03/30/17 1001 03/30/17 2135  TROPONINI <0.03 <0.03 <0.03   BNP: Invalid input(s): POCBNP CBG: Recent Labs  Lab 04/02/17 0704 04/02/17  1138 04/02/17 1614 04/02/17 2131 04/03/17 0608  GLUCAP 233* 339* 303* 266* 241*   D-Dimer No results for input(s): DDIMER in the last 72 hours. Hgb A1c No results for input(s): HGBA1C in the last 72 hours. Lipid Profile No results for input(s): CHOL, HDL, LDLCALC, TRIG, CHOLHDL, LDLDIRECT in the last 72 hours. Thyroid function studies No results for input(s): TSH, T4TOTAL, T3FREE, THYROIDAB in the last 72 hours.  Invalid input(s): FREET3 Anemia work up No results for input(s): VITAMINB12, FOLATE, FERRITIN, TIBC, IRON, RETICCTPCT in the last 72 hours. Urinalysis     Component Value Date/Time   COLORURINE YELLOW 03/29/2017 2222   APPEARANCEUR HAZY (A) 03/29/2017 2222   LABSPEC 1.010 03/29/2017 2222   PHURINE 5.0 03/29/2017 2222   GLUCOSEU 150 (A) 03/29/2017 2222   HGBUR SMALL (A) 03/29/2017 2222   BILIRUBINUR NEGATIVE 03/29/2017 2222   BILIRUBINUR neg 02/07/2017 1438   KETONESUR NEGATIVE 03/29/2017 2222   PROTEINUR NEGATIVE 03/29/2017 2222   UROBILINOGEN 0.2 02/07/2017 1438   UROBILINOGEN 0.2 11/05/2014 1555   NITRITE NEGATIVE 03/29/2017 2222   LEUKOCYTESUR NEGATIVE 03/29/2017 2222   Sepsis Labs Invalid input(s): PROCALCITONIN,  WBC,  LACTICIDVEN Microbiology Recent Results (from the past 240 hour(s))  Urine culture     Status: Abnormal   Collection Time: 03/30/17  2:00 AM  Result Value Ref Range Status   Specimen Description URINE, CLEAN CATCH  Final   Special Requests NONE  Final   Culture 70,000 COLONIES/mL ESCHERICHIA COLI (A)  Final   Report Status 04/01/2017 FINAL  Final   Organism ID, Bacteria ESCHERICHIA COLI (A)  Final      Susceptibility   Escherichia coli - MIC*    AMPICILLIN <=2 SENSITIVE Sensitive     CEFAZOLIN <=4 SENSITIVE Sensitive     CEFTRIAXONE <=1 SENSITIVE Sensitive     CIPROFLOXACIN <=0.25 SENSITIVE Sensitive     GENTAMICIN <=1 SENSITIVE Sensitive     IMIPENEM <=0.25 SENSITIVE Sensitive     NITROFURANTOIN <=16 SENSITIVE Sensitive     TRIMETH/SULFA >=320 RESISTANT Resistant     AMPICILLIN/SULBACTAM <=2 SENSITIVE Sensitive     PIP/TAZO <=4 SENSITIVE Sensitive     Extended ESBL NEGATIVE Sensitive     * 70,000 COLONIES/mL ESCHERICHIA COLI     Time coordinating discharge: 45 minutes  SIGNED:   Coralie Keens, MD  [facility] 04/03/2017, 10:34 AM Pager [phone]  If 7PM-7AM, please contact night-coverage [email or web address] Password [email or web address]

## 2017-04-03 NOTE — Progress Notes (Signed)
Inpatient Rehabilitation  Discussed case and recommendations with son.  Patient, spouse, and son are all in agreement with plan for IP Rehab and we will provide resources for home care as needed if spouse is unable to assist.  I have medical clearance, insurance authorization, and an available bed.  Plan to proceed with IP Rehab admission.  Updated team; call if questions.   Charlane FerrettiMelissa Marcus Schwandt, M.A., CCC/SLP Admission Coordinator  [facility]  Cell [phone]

## 2017-04-03 NOTE — Care Management Important Message (Signed)
Important Message  Patient Details  Name: Casey Harrell MRN: [id number] Date of Birth: 06-Aug-1934   Medicare Important Message Given:  Yes    [name] 04/03/2017, 10:10 AM

## 2017-04-03 NOTE — Progress Notes (Signed)
Inpatient Rehabilitation  Met with patient, spouse, brother, and sister-in-law at bedside to discuss team's recommendation for IP Rehab as well as excepted goals and level of assist required at home.  Patient and spouse have requested that I discuss case with their son.  I have called Marilynn Latino. and await a call back.  Plan to update team as I know.  Call if questions.   Carmelia Roller., CCC/SLP Admission Coordinator  [facility]  Cell [phone]

## 2017-04-04 ENCOUNTER — Inpatient Hospital Stay (HOSPITAL_COMMUNITY): Payer: Medicare Other | Admitting: Speech Pathology

## 2017-04-04 ENCOUNTER — Inpatient Hospital Stay (HOSPITAL_COMMUNITY): Payer: Medicare Other | Admitting: Occupational Therapy

## 2017-04-04 ENCOUNTER — Inpatient Hospital Stay (HOSPITAL_COMMUNITY): Payer: Medicare Other | Admitting: Physical Therapy

## 2017-04-04 DIAGNOSIS — G8191 Hemiplegia, unspecified affecting right dominant side: Secondary | ICD-10-CM

## 2017-04-04 DIAGNOSIS — E1165 Type 2 diabetes mellitus with hyperglycemia: Secondary | ICD-10-CM

## 2017-04-04 DIAGNOSIS — S93401D Sprain of unspecified ligament of right ankle, subsequent encounter: Secondary | ICD-10-CM

## 2017-04-04 LAB — GLUCOSE, RANDOM: Glucose, Bld: 494 mg/dL — ABNORMAL HIGH (ref 65–99)

## 2017-04-04 LAB — CBC WITH DIFFERENTIAL/PLATELET
Basophils Absolute: 0 10*3/uL (ref 0.0–0.1)
Basophils Relative: 0 %
Eosinophils Absolute: 0.4 10*3/uL (ref 0.0–0.7)
Eosinophils Relative: 5 %
HCT: 37.1 % (ref 36.0–46.0)
Hemoglobin: 11.9 g/dL — ABNORMAL LOW (ref 12.0–15.0)
Lymphocytes Relative: 32 %
Lymphs Abs: 3 10*3/uL (ref 0.7–4.0)
MCH: 26.8 pg (ref 26.0–34.0)
MCHC: 32.1 g/dL (ref 30.0–36.0)
MCV: 83.6 fL (ref 78.0–100.0)
Monocytes Absolute: 0.8 10*3/uL (ref 0.1–1.0)
Monocytes Relative: 8 %
Neutro Abs: 5.3 10*3/uL (ref 1.7–7.7)
Neutrophils Relative %: 55 %
Platelets: 306 10*3/uL (ref 150–400)
RBC: 4.44 MIL/uL (ref 3.87–5.11)
RDW: 15.3 % (ref 11.5–15.5)
WBC: 9.5 10*3/uL (ref 4.0–10.5)

## 2017-04-04 LAB — GLUCOSE, CAPILLARY
Glucose-Capillary: 318 mg/dL — ABNORMAL HIGH (ref 65–99)
Glucose-Capillary: 527 mg/dL (ref 65–99)
Glucose-Capillary: 415 mg/dL — ABNORMAL HIGH (ref 65–99)
Glucose-Capillary: 339 mg/dL — ABNORMAL HIGH (ref 65–99)
Glucose-Capillary: 316 mg/dL — ABNORMAL HIGH (ref 65–99)

## 2017-04-04 LAB — COMPREHENSIVE METABOLIC PANEL
ALT: 14 U/L (ref 14–54)
AST: 15 U/L (ref 15–41)
Albumin: 3.4 g/dL — ABNORMAL LOW (ref 3.5–5.0)
Alkaline Phosphatase: 90 U/L (ref 38–126)
Anion gap: 8 (ref 5–15)
BUN: 22 mg/dL — ABNORMAL HIGH (ref 6–20)
CO2: 27 mmol/L (ref 22–32)
Calcium: 9.4 mg/dL (ref 8.9–10.3)
Chloride: 101 mmol/L (ref 101–111)
Creatinine, Ser: 0.92 mg/dL (ref 0.44–1.00)
GFR calc Af Amer: >60 mL/min (ref >60)
GFR calc non Af Amer: 58 mL/min — ABNORMAL LOW (ref >60)
Glucose, Bld: 314 mg/dL — ABNORMAL HIGH (ref 65–99)
Potassium: 4.2 mmol/L (ref 3.5–5.1)
Sodium: 136 mmol/L (ref 135–145)
Total Bilirubin: 0.3 mg/dL (ref 0.3–1.2)
Total Protein: 6.9 g/dL (ref 6.5–8.1)

## 2017-04-04 MED ORDER — INSULIN ASPART 100 UNIT/ML ~~LOC~~ SOLN
9.0000 [IU] | Freq: Once | SUBCUTANEOUS | Status: AC
  Administered 2017-04-04: 9 [IU] via SUBCUTANEOUS

## 2017-04-04 MED ORDER — INSULIN DETEMIR 100 UNIT/ML ~~LOC~~ SOLN
35.0000 [IU] | Freq: Two times a day (BID) | SUBCUTANEOUS | Status: DC
  Administered 2017-04-04: 35 [IU] via SUBCUTANEOUS
  Filled 2017-04-04 (×2): qty 0.35

## 2017-04-04 NOTE — Progress Notes (Signed)
Orthopedic Tech Progress Note Patient Details:  Casey Harrell 10-24-1934 [id number]  Ortho Devices Type of Ortho Device: Ankle Air splint Ortho Device/Splint Location: rle Ortho Device/Splint Interventions: Application   [name] 04/04/2017, 9:07 AM

## 2017-04-04 NOTE — Progress Notes (Signed)
Physical Medicine and Rehabilitation Consult Reason for Consult: Right side weakness and slurred speech Referring Physician: [facility]   HPI: Casey Harrell is a 82 y.o. 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 Per chart review and patient, patient lives with spouse, who has cancer. Independent with assistive device using rolling walker. Did not need assist for ADLs. One level home with 6 steps to entry. Presented 03/30/2017 with increasing right sided weakness and difficulty with speech. Cranial CT scan reviewed, unremarkable for acute process. Per report, multifocal old infarcts. Patient did not receive TPA. Troponin negative. MRI showed small acute infarct of the left pons measuring approximately 8 x 3 mm. No associated hemorrhage. MRA motion degraded showed moderate to severe stenosis of the supraclinoid right ICA. No occlusion or high-grade stenosis of the intracranial arch vessels. Echocardiogram with ejection fraction of 60% no wall motion abnormalities. Neurology consulted with aspirin added in addition to Eliquis that patient was on prior to admission. Tolerating a regular diet. Physical and occupational therapy evaluations completed with recommendations of physical medicine rehabilitation consult.   Review of Systems  Constitutional: Negative for chills and fever.  HENT: Negative for hearing loss.   Eyes: Negative for blurred vision and double vision.  Respiratory: Positive for shortness of breath.   Cardiovascular: Negative for chest pain and leg swelling.  Gastrointestinal: Positive for constipation. Negative for nausea.       GERD  Genitourinary: Negative for dysuria, flank pain and hematuria.  Musculoskeletal:  Positive for joint pain and myalgias.  Skin: Negative for rash.  Neurological: Positive for dizziness, speech change and focal weakness.  All other systems reviewed and are negative.      Past Medical History:  Diagnosis Date  . ASTHMA 07/15/2008  . COPD (chronic obstructive pulmonary disease) (HCC)   . DIABETES-TYPE 2 07/15/2008  . DIABETIC PERIPHERAL NEUROPATHY 09/02/2009  . GERD 07/15/2008  . HYPERTENSION 07/15/2008  . Normal cardiac stress test    low risk Nuc 2009, Feb 2011  . Paroxysmal atrial fibrillation (HCC)   . PVD (peripheral vascular disease) (HCC) 06/18/09   RCE  . Stroke (HCC)   . Vertigo         Past Surgical History:  Procedure Laterality Date  . ABDOMINAL HYSTERECTOMY  1971  . APPENDECTOMY  1971  . CAROTID ENDARTERECTOMY  06/18/09   RCE  . CESAREAN SECTION    . colectomy and colostomy    . FLEXIBLE SIGMOIDOSCOPY     diverticulitis  . FOOT SURGERY     right foot X 2  . LAPAROTOMY     X 3 with adhesions lysis  . TONSILLECTOMY    . TUBAL LIGATION     Family History  Problem Relation Age of Onset  . Diabetes Mother   . Arthritis Mother   . Heart disease Mother   . Heart disease Father   . Heart disease Sister   . Heart disease Brother   . Cancer Brother 60       lung cancer  . Celiac disease Sister   . Stroke Maternal Grandfather   . Cancer Paternal Grandfather        lung  . Heart disease Sister   . Asthma Sister    Social History:  reports that  has never smoked. she has never  used smokeless tobacco. She reports that she does not drink alcohol or use drugs. Allergies:       Allergies  Allergen Reactions  . Doxycycline Hyclate Nausea And Vomiting  . Morphine Sulfate Other (See Comments)    REACTION: hallucinations  . Iohexol Hives     Code: HIVES, Desc: pt. states she breaks out in hives 06/15/08   . Moxifloxacin Other (See Comments)    REACTION: unknown         Medications Prior to  Admission  Medication Sig Dispense Refill  . apixaban (ELIQUIS) 5 MG TABS tablet TAKE 1 TABLET(5 MG) BY MOUTH TWICE DAILY 60 tablet 11  . atorvastatin (LIPITOR) 40 MG tablet TAKE 1 TABLET BY MOUTH EVERY DAY 90 tablet 3  . carvedilol (COREG) 6.25 MG tablet TAKE 1 TABLET BY MOUTH TWICE A DAY WITH A MEAL 180 tablet 1  . irbesartan (AVAPRO) 150 MG tablet TAKE 1 TABLET BY MOUTH EVERY DAY 90 tablet 1  . LEVEMIR FLEXTOUCH 100 UNIT/ML Pen INJECT 40 UNITS INTO THE SKIN TWICE A DAY (Patient taking differently: INJECT 30 UNITS INTO THE SKIN TWICE A DAY) 3 mL 5  . levothyroxine (SYNTHROID, LEVOTHROID) 50 MCG tablet TAKE 1 TABLET BY MOUTH EVERY DAY BEFORE BREAKFAST 90 tablet 1  . LYRICA 150 MG capsule TAKE ONE CAPSULE BY MOUTH EVERY DAY 90 capsule 1  . metFORMIN (GLUCOPHAGE) 1000 MG tablet TAKE 1 TABLET BY MOUTH TWICE A DAY WITH A MEAL 180 tablet 2  . pantoprazole (PROTONIX) 40 MG tablet TAKE 1 TABLET BY MOUTH TWICE A DAY 180 tablet 1  . acetaminophen (TYLENOL) 325 MG tablet Take 650 mg by mouth every 6 (six) hours as needed (pain).    Marland Kitchen albuterol (ACCUNEB) 1.25 MG/3ML nebulizer solution Take 3 mLs (1.25 mg total) by nebulization every 4 (four) hours as needed for wheezing. Reported on 04/26/2015 75 mL 12  . CALCIUM PO Take 1 tablet by mouth daily.    . Cholecalciferol (VITAMIN D3) 2000 UNITS TABS Take 2,000 Units by mouth daily. Reported on 04/26/2015    . DULoxetine (CYMBALTA) 30 MG capsule TAKE 1 CAPSULE BY MOUTH EVERY DAY FOR 2 WEEKS, THEN INCREASE TO 1 CAPSULE TWICE A DAY (Patient taking differently: Take 1 capsule (30mg ) twice daily.) 180 capsule 1  . HUMALOG KWIKPEN 100 UNIT/ML KiwkPen INJECT 9-14 UNITS SUBCUTANEOUSLY TWICE DAILY WITH MEAL 9 UNITS BREAKFAST AND 14 UNITS AFTER SUPPER 45 mL 1  . ipratropium-albuterol (DUONEB) 0.5-2.5 (3) MG/3ML SOLN Take 3 mLs by nebulization every 6 (six) hours as needed (shortness of breath or wheezing). 360 mL 0  . meclizine (ANTIVERT) 25 MG tablet Take 1 tablet  (25 mg total) by mouth 3 (three) times daily as needed for dizziness. 30 tablet 1  . ondansetron (ZOFRAN ODT) 4 MG disintegrating tablet Take 1 tablet (4 mg total) by mouth every 8 (eight) hours as needed for nausea or vomiting. 8 tablet 0    Home: Home Living Family/patient expects to be discharged to:: Private residence Living Arrangements: Spouse/significant other Available Help at Discharge: Family, Available 24 hours/day Type of Home: House Home Access: Stairs to enter [employer] of Steps: 6 Entrance Stairs-Rails: Can reach both Home Layout: One level Bathroom Shower/Tub: Psychologist, counselling, Door [employer] Toilet: Standard Bathroom Accessibility: Yes Home Equipment: Environmental consultant - 4 wheels, Bedside commode, Shower seat - built in, [employer] - tub/shower, Hand held shower head  Functional History: Prior Function Level of Independence: Independent with assistive device(s) Comments: ambulated with RW, no  assist with ADLs, iADLs  Functional Status:  Mobility: Bed Mobility Overal bed mobility: Needs Assistance Bed Mobility: Supine to Sit Rolling: Min assist Supine to sit: Mod assist Sit to supine: Max assist General bed mobility comments: Cues for technique. HOB flat with use of bed rail. Assist for RLE to EOB and trunk elevation to sitting Transfers Overall transfer level: Needs assistance Transfers: Sit to/from Stand, Stand Pivot Transfers Sit to Stand: Max assist Stand pivot transfers: Max assist, +2 physical assistance General transfer comment: Blocked R knee and foot, cues for sequencing and safety. Pt able to take small pivotal steps toward L side.   ADL: ADL Overall ADL's : Needs assistance/impaired Eating/Feeding: Moderate assistance, Sitting Grooming: Moderate assistance, Sitting Upper Body Bathing: Maximal assistance, Sitting Lower Body Bathing: Maximal assistance, +2 for physical assistance, Sit to/from stand Upper Body Dressing : Maximal assistance,  Sitting Lower Body Dressing: Maximal assistance, +2 for physical assistance, Sit to/from stand Toilet Transfer: Maximal assistance, +2 for physical assistance, Stand-pivot, BSC Toilet Transfer Details (indicate cue type and reason): Simulated by transfer EOB>chair Functional mobility during ADLs: Maximal assistance, +2 for physical assistance(for stand pivot only) General ADL Comments: Discussed post acute rehab, pt agreeable. No c/o dizziness this session.  Cognition: Cognition Overall Cognitive Status: Impaired/Different from baseline Arousal/Alertness: Awake/alert Orientation Level: Oriented X4 Attention: Focused, Sustained Focused Attention: Appears intact Sustained Attention: Appears intact Memory: Impaired Memory Impairment: Decreased short term memory Decreased Short Term Memory: Verbal basic, Functional basic(pt did not recall OT having visited her this morning) Awareness: Appears intact Cognition Arousal/Alertness: Awake/alert Behavior During Therapy: WFL for tasks assessed/performed Overall Cognitive Status: Impaired/Different from baseline  Blood pressure (!) 180/64, pulse 69, temperature 98.3 F (36.8 C), temperature source Oral, resp. rate 16, height 5\' 1"  (1.549 m), weight 65.3 kg (143 lb 15.4 oz), SpO2 93 %. Physical Exam  Constitutional: She appears well-developed and well-nourished.  HENT:  Head: Normocephalic and atraumatic.  Eyes: EOM are normal. Right eye exhibits no discharge. Left eye exhibits no discharge.  Neck: Normal range of motion. Neck supple. No thyromegaly present.  Cardiovascular: Normal rate and regular rhythm.  Respiratory: Effort normal and breath sounds normal. No respiratory distress.  GI: Soft. Bowel sounds are normal. She exhibits no distension.  Musculoskeletal: She exhibits edema (Right ankle). She exhibits no tenderness.  Neurological: She is alert.  Makes eye contact with examiner.  Mild dysarthria.  Follow simple commands Motor:  LUE: 5/5 proximal to distal LLE: 4+/5 proximal to distal RUE: 2-/5 proximal to distal (baseline) RLE: 1/5 proximal to distal  Skin: Skin is warm and dry.  Psychiatric: She has a normal mood and affect. Her behavior is normal. Thought content normal.  Assessment/Plan: Diagnosis: small acute infarct of the left pons  Labs and images independently reviewed.  Records reviewed and summated above. Stroke: Continue secondary stroke prophylaxis and Risk Factor Modification listed below:   Antiplatelet therapy:   Blood Pressure Management:  Continue current medication with prn's with permisive HTN per primary team Statin Agent:   Diabetes management:   Right sided hemiparesis: fit for orthosis to prevent contractures (resting hand splint for day, wrist cock up splint at night, PRAFO, etc) Motor recovery: Fluoxetine  1. Does the need for close, 24 hr/day medical supervision in concert with the patient's rehab needs make it unreasonable for this patient to be served in a less intensive setting? Yes  2. Co-Morbidities requiring supervision/potential complications: HTN (monitor and provide prns in accordance with increased physical exertion and pain), hyperlipidemia (  cont meds), diabetes mellitus with neuropathy (Monitor in accordance with exercise and adjust meds as necessary), atrial fibrillation (cont meds, monitor HR with increased activity), history of CVA with residual deficits), depression (ensure mood does not hinder progress of therapies), hypothyroidism (cont meds, ensure appropriate mood and energy level for therapies) 3. Due to bladder management, bowel management, safety, disease management and patient education, does the patient require 24 hr/day rehab nursing? Yes 4. Does the patient require coordinated care of a physician, rehab nurse, PT (1-2 hrs/day, 5 days/week), OT (1-2 hrs/day, 5 days/week) and SLP (1-2 hrs/day, 5 days/week) to address physical and functional deficits in the context of  the above medical diagnosis(es)? Yes Addressing deficits in the following areas: balance, endurance, locomotion, strength, transferring, bowel/bladder control, bathing, dressing, toileting and psychosocial support 5. Can the patient actively participate in an intensive therapy program of at least 3 hrs of therapy per day at least 5 days per week? Yes 6. The potential for patient to make measurable gains while on inpatient rehab is excellent 7. Anticipated functional outcomes upon discharge from inpatient rehab are min assist and mod assist  with PT, min assist and mod assist with OT, n/a with SLP. 8. Estimated rehab length of stay to reach the above functional goals is: 20-24 days. 9. Anticipated D/C setting: Other 10. Anticipated post D/C treatments: SNF 11. Overall Rehab/Functional Prognosis: good  RECOMMENDATIONS: This patient's condition is appropriate for continued rehabilitative care in the following setting: Given exacerbation of previous deficits, pt will likely need a longer course of rehab, especially given limited support at discharge. Recommend SNF with PM&R follow up. Patient has agreed to participate in recommended program. Potentially Note that insurance prior authorization may be required for reimbursement for recommended care.  Comment: Rehab Admissions Coordinator to follow up.  Maryla MorrowAnkit Patel, MD, ABPMR Mcarthur Rossettianiel J Angiulli, PA-C 04/02/2017          Revision History                        Routing History

## 2017-04-04 NOTE — Progress Notes (Signed)
Subjective/Complaints: Pt reports doing her own cooking and cleaning prior to most recent CVA, no PMR f/u after 2016 CVA, normal motors on Neuro f/u 02/23/2016  ROS- no CP SOB, abd pain, neg N/V Objective: Vital Signs: Blood pressure (!) 157/79, pulse 69, temperature 97.6 F (36.4 C), temperature source Oral, resp. rate (!) 24, height '5\' 1"'$  (1.549 m), weight 63.4 kg (139 lb 12.4 oz), SpO2 94 %. No results found. Results for orders placed or performed during the hospital encounter of 04/03/17 (from the past 72 hour(s))  Glucose, capillary     Status: Abnormal   Collection Time: 04/03/17  9:24 PM  Result Value Ref Range   Glucose-Capillary 425 (H) 65 - 99 mg/dL  CBC WITH DIFFERENTIAL     Status: Abnormal   Collection Time: 04/04/17  6:35 AM  Result Value Ref Range   WBC 9.5 4.0 - 10.5 K/uL   RBC 4.44 3.87 - 5.11 MIL/uL   Hemoglobin 11.9 (L) 12.0 - 15.0 g/dL   HCT 37.1 36.0 - 46.0 %   MCV 83.6 78.0 - 100.0 fL   MCH 26.8 26.0 - 34.0 pg   MCHC 32.1 30.0 - 36.0 g/dL   RDW 15.3 11.5 - 15.5 %   Platelets 306 150 - 400 K/uL   Neutrophils Relative % 55 %   Neutro Abs 5.3 1.7 - 7.7 K/uL   Lymphocytes Relative 32 %   Lymphs Abs 3.0 0.7 - 4.0 K/uL   Monocytes Relative 8 %   Monocytes Absolute 0.8 0.1 - 1.0 K/uL   Eosinophils Relative 5 %   Eosinophils Absolute 0.4 0.0 - 0.7 K/uL   Basophils Relative 0 %   Basophils Absolute 0.0 0.0 - 0.1 K/uL  Comprehensive metabolic panel     Status: Abnormal   Collection Time: 04/04/17  6:35 AM  Result Value Ref Range   Sodium 136 135 - 145 mmol/L   Potassium 4.2 3.5 - 5.1 mmol/L   Chloride 101 101 - 111 mmol/L   CO2 27 22 - 32 mmol/L   Glucose, Bld 314 (H) 65 - 99 mg/dL   BUN 22 (H) 6 - 20 mg/dL   Creatinine, Ser 0.92 0.44 - 1.00 mg/dL   Calcium 9.4 8.9 - 10.3 mg/dL   Total Protein 6.9 6.5 - 8.1 g/dL   Albumin 3.4 (L) 3.5 - 5.0 g/dL   AST 15 15 - 41 U/L   ALT 14 14 - 54 U/L   Alkaline Phosphatase 90 38 - 126 U/L   Total Bilirubin 0.3 0.3 -  1.2 mg/dL   GFR calc non Af Amer 58 (L) >60 mL/min   GFR calc Af Amer >60 >60 mL/min    Comment: (NOTE) The eGFR has been calculated using the CKD EPI equation. This calculation has not been validated in all clinical situations. eGFR's persistently <60 mL/min signify possible Chronic Kidney Disease.    Anion gap 8 5 - 15  Glucose, capillary     Status: Abnormal   Collection Time: 04/04/17  6:47 AM  Result Value Ref Range   Glucose-Capillary 318 (H) 65 - 99 mg/dL     HEENT: normal Cardio: irregular and no murmur Resp: CTA B/L and unlabored GI: BS positive and NT, ND Extremity:  Pulses positive and No Edema Skin:   Intact Neuro: Alert/Oriented, Abnormal Sensory oriented to day  but not month or place even with cuing, Abnormal Motor 3- R delt, 4- R triceps, 3- R biceps an grip, Abnormal FMC Ataxic/ dec FMC and  Other RLE 3- HF, 4- KE, 3- ADF with pain limitation Musc/Skel:  Other pain over R achilles to palp , no calf swelling or tenderness, yellow ecchymosis R lateral malleolus Gen NAD Reduced sensation in R L5 and S1 dermatome  Assessment/Plan: 1. Functional deficits secondary to Left pontine infarct which require 3+ hours per day of interdisciplinary therapy in a comprehensive inpatient rehab setting. Physiatrist is providing close team supervision and 24 hour management of active medical problems listed below. Physiatrist and rehab team continue to assess barriers to discharge/monitor patient progress toward functional and medical goals. FIM:             Function - Chair/bed transfer Chair/bed transfer method: Stand pivot Chair/bed transfer assist level: Touching or steadying assistance (Pt > 75%) Chair/bed transfer assistive device: Bedrails     Function - Comprehension Comprehension: Auditory Comprehension assist level: Understands basic 90% of the time/cues < 10% of the time  Function - Expression Expression assist level: Expresses complex 90% of the time/cues <  10% of the time  Function - Social Interaction Social Interaction assist level: Interacts appropriately 90% of the time - Needs monitoring or encouragement for participation or interaction.  Function - Problem Solving Problem solving assist level: Solves basic 75 - 89% of the time/requires cueing 10 - 24% of the time  Function - Memory Patient normally able to recall (first 3 days only): That he or she is in a hospital, Current season  Medical Problem List and Plan: 1.Increased Right-sided weaknesssecondary to acute infarction left pons as well as history of CVA posterior cerebral artery December 2016 with right-sided residual weakness -CIR PT, OT, SLP evals 2. DVT Prophylaxis/Anticoagulation: Eliquis 3. Pain Management:Lyrica 150 mg daily, Cymbalta 30 mg twice a day,? Diabetic neuropathy- no symmetric numbness  Voltaren gel -right ankle sprain-----ice, PRAFO boot for support,Air cast 4. Mood:Provide emotional support 5. Neuropsych: This patientisnot capable of making decisions on herown behalf. Poor orientation will ask neuropsych to eval 6. Skin/Wound Care:Routine skin checks 7. Fluids/Electrolytes/Nutrition:Routine I&O's with follow-up chemistries 8.Diabetes mellitus and peripheral neuropathy. Hemoglobin A1c 8.6. Levemir 30 units twice a day. Check blood sugars before meals and at bedtime CBG (last 3)  Recent Labs    04/03/17 1631 04/03/17 2124 04/04/17 0647  GLUCAP 370* 425* 318*  Poor control PCP rec 40U BID, will increase to 35U 9.Hypothyroidism. Synthroid 10.Hypertension. Avapro 150 mg daily, Norvasc 5 mg daily, Coreg 6.25 mg twice a day. Vitals:   04/03/17 1715 04/04/17 0435  BP: (!) 142/70 (!) 157/79  Pulse: 69   Resp: 19 (!) 24  Temp: 97.8 F (36.6 C) 97.6 F (36.4 C)  SpO2: 99% 94%   11.Hyperlipidemia. Lipitor 12.GERD. Protonix    LOS (Days) 1 A FACE TO FACE EVALUATION WAS PERFORMED  Charlett Blake 04/04/2017,  8:19 AM

## 2017-04-04 NOTE — Evaluation (Signed)
Occupational Therapy Assessment and Plan  Patient Details  Name: SHAMBHAVI SALLEY MRN: [id number] Date of Birth: 1934/11/29  OT Diagnosis: ataxia, cognitive deficits, hemiplegia affecting dominant side and muscle weakness (generalized) Rehab Potential: Rehab Potential (ACUTE ONLY): Good ELOS: 2-2.5 weeks   Today's Date: 04/04/2017 OT Individual Time: 1345-1500 OT Individual Time Calculation (min): 75 min     Problem List:  Patient Active Problem List   Diagnosis Date Noted  . Left pontine cerebrovascular accident ([place]) 04/03/2017  . History of CVA with residual deficit   . Type 2 diabetes mellitus with peripheral neuropathy (HCC)   . Anemia 03/30/2017  . History of stroke   . Chest pain 02/09/2016  . BPPV (benign paroxysmal positional vertigo) 08/25/2015  . HLD (hyperlipidemia) 08/25/2015  . At high risk for falls 07/01/2015  . Vertigo 06/21/2015  . Paroxysmal atrial fibrillation ([place]) 06/11/2015  . Cerebral infarction due to stenosis of left middle cerebral artery ([place]) 05/20/2015  . Intracranial vascular stenosis 05/20/2015  . Type 2 diabetes mellitus with circulatory disorder ([place]) 05/20/2015  . Type 2 diabetes, uncontrolled, with neuropathy ([place])   . Abdominal pain   . Partial small bowel obstruction ([place])   . Benign essential HTN   . Small bowel obstruction ([place]) 05/07/2015  . Hemiplegia, unspecified affecting right dominant side ([place]) 04/08/2015  . Embolic stroke involving posterior cerebral artery ([place]) 04/05/2015  . Orthostatic hypotension   . Hypertensive urgency 04/01/2015  . Fever in adult 04/01/2015  . Acute encephalopathy 04/01/2015  . Confusion   . CVA (cerebral infarction)   . Posterior circulation stroke ([place])   . Acute ischemic stroke ([place]) 03/31/2015  . Stroke ([place]) 03/31/2015  . CKD (chronic kidney disease) stage 3, GFR 30-59 ml/min (HCC) 04/17/2014  . Gastroenteritis, non-infectious 04/11/2014  . Nausea vomiting and diarrhea 04/10/2014  . Hypokalemia  04/10/2014  . Dehydration 04/09/2014  . Bronchitis with airway obstruction ([place]) 02/03/2013  . Leukocytosis 02/03/2013  . Fever 02/03/2013  . Acute respiratory failure with hypoxia ([place]) 02/03/2013  . Depression 02/03/2013  . Hyponatremia 02/03/2013  . Obesity (BMI 30-39.9) 01/10/2013  . PVD (peripheral vascular disease)- S/P RCE 06/18/09 12/13/2012  . Sleep apnea- C-pap intol 12/13/2012  . Low risk cardiac nuclear stress test- 2009 and Feb 2011 12/13/2012  . Mild persistent asthma 11/29/2011  . Chronic cough 06/05/2011  . Diabetic neuropathy ([place]) 09/02/2009  . Dyslipidemia 07/13/2009  . Hypothyroidism 11/12/2008  . Type 2 diabetes mellitus, uncontrolled ([place]) 07/15/2008  . Essential hypertension 07/15/2008  . GERD 07/15/2008  . DIVERTICULOSIS, COLON 07/15/2008    Past Medical History:  Past Medical History:  Diagnosis Date  . ASTHMA 07/15/2008  . COPD (chronic obstructive pulmonary disease) ([place])   . DIABETES-TYPE 2 07/15/2008  . DIABETIC PERIPHERAL NEUROPATHY 09/02/2009  . GERD 07/15/2008  . HYPERTENSION 07/15/2008  . Normal cardiac stress test    low risk Nuc 2009, Feb 2011  . Paroxysmal atrial fibrillation (HCC)   . PVD (peripheral vascular disease) ([place]) 06/18/09   RCE  . Stroke ([place])   . Vertigo    Past Surgical History:  Past Surgical History:  Procedure Laterality Date  . ABDOMINAL HYSTERECTOMY  1971  . APPENDECTOMY  1971  . CAROTID ENDARTERECTOMY  06/18/09   RCE  . CESAREAN SECTION    . colectomy and colostomy    . FLEXIBLE SIGMOIDOSCOPY     diverticulitis  . FOOT SURGERY     right foot X 2  . LAPAROTOMY     X  3 with adhesions lysis  . TONSILLECTOMY    . TUBAL LIGATION      Assessment & Plan Clinical Impression: KENYETTE GUNDY a 82 y.o.right handed femalewith history of hypertension, hyperlipidemia, diabetes mellitus with neuropathy, atrial fibrillation maintained on Eliquisand CVA embolic involving posterior cerebral artery with right-sided weaknessas  well as some memory impairment inDecember 2016 and received inpatient rehabilitation services. Per chart reviewand patient,patient lives with spouse, who has cancer. Independent with assistive device using rolling walker. Did not need assist for ADLs. One level home with 6 steps to entry. Presented 03/30/2017 with increasing right sided weakness and difficulty with speech. Cranial CT scanreviewed, unremarkable for acute process. Per report, multifocal old infarcts. Patient did not receive TPA. Troponin negative. MRI showed small acute infarct of the left pons measuring approximately 8 x 3 mm. No associated hemorrhage. MRA motion degraded showed moderate to severe stenosis of the supraclinoid right ICA. No occlusion or high-grade stenosis of the intracranial arch vessels. Echocardiogram with ejection fraction of 60% no wall motion abnormalities. Neurology consulted with aspirin added in addition toEliquisthat patient was on prior to admission. Tolerating a regular diet. Physical and occupational therapy evaluations completed with recommendations of physical medicine rehabilitation consult.Patient was admitted for a comprehensive rehabilitation program    Patient transferred to CIR on 04/03/2017 .    Patient currently requires mod with basic self-care skills secondary to muscle weakness, decreased cardiorespiratoy endurance, impaired timing and sequencing, motor apraxia, ataxia, decreased coordination and decreased motor planning and decreased sitting balance, decreased standing balance, decreased postural control, hemiplegia and decreased balance strategies.  Prior to hospitalization, patient could complete ADLs/IADLs with modified independent .  Patient will benefit from skilled intervention to decrease level of assist with basic self-care skills and increase independence with basic self-care skills prior to discharge home with care partner.  Anticipate patient will require 24 hour supervision and min  A for shower transfers and follow up home health.  OT - End of Session Activity Tolerance: Tolerates 10 - 20 min activity with multiple rests Endurance Deficit: Yes Endurance Deficit Description: Required rest breaks throughout seated ADL session OT Assessment Rehab Potential (ACUTE ONLY): Good OT Barriers to Discharge: Decreased caregiver support OT Barriers to Discharge Comments: Pt's daughter is working to piece together 24hr care; pt's husband has dementia and stage 4 cancer OT Patient demonstrates impairments in the following area(s): Balance;Motor;Sensory;Cognition;Pain;Perception;Endurance;Safety OT Basic ADL's Functional Problem(s): Eating;Grooming;Bathing;Dressing;Toileting OT Transfers Functional Problem(s): Toilet;Tub/Shower OT Additional Impairment(s): Fuctional Use of Upper Extremity OT Plan OT Intensity: Minimum of 1-2 x/day, 45 to 90 minutes OT Frequency: 5 out of 7 days OT Duration/Estimated Length of Stay: 2-2.5 weeks OT Treatment/Interventions: Balance/vestibular training;Cognitive remediation/compensation;Discharge planning;DME/adaptive equipment instruction;Functional electrical stimulation;Neuromuscular re-education;Patient/family education;Self Care/advanced ADL retraining;Psychosocial support;Functional mobility training;Pain management;Skin care/wound managment;Therapeutic Exercise;UE/LE Coordination activities;UE/LE Strength taining/ROM OT Self Feeding Anticipated Outcome(s): Mod I OT Basic Self-Care Anticipated Outcome(s): Supervision OT Toileting Anticipated Outcome(s): Supervision OT Bathroom Transfers Anticipated Outcome(s): Supervision-min A OT Recommendation Patient destination: Home Follow Up Recommendations: Home health OT Equipment Recommended: To be determined   Skilled Therapeutic Intervention Pt seen for OT eval and ADL bathing/dressing session. Pt in supine upon arrival, easily awoken and pleasantly confused. Pt's husband and daughter present for  eval.  She completed stand pivot transfers throughout session with mod-max A, having trouble motor planning/sequencing of steps for transfer. Difficulty maintaining arousal initially therefore, did not get pt into shower. She became more alert during bathing/dressing at sink. She was able to follow basic commands and use  R UE at non-dominant level with significantly increased time due to motor planning deficits. Completed toileting task with heavy assist and assist for thoroughness following BM.  She requested to return to bed at end of session, left in supine with all needs in reach and bed alarm on. Education provided throughout regarding role of OT, POC, OT/PT goals and d/c planning. Ice applied to ankle per family request.  OT Evaluation Precautions/Restrictions  Precautions Precautions: Fall Precaution Comments: R ankle bruising Restrictions Weight Bearing Restrictions: No General Chart Reviewed: Yes Pain   No/ denies pain Home Living/Prior Functioning Home Living Family/patient expects to be discharged to:: Private residence Living Arrangements: Spouse/significant other Available Help at Discharge: Available 24 hours/day Type of Home: House Home Access: Stairs to enter [employer] of Steps: 6 Entrance Stairs-Rails: Can reach both, Left, Right Home Layout: One level Bathroom Shower/Tub: Gaffer, Charity fundraiser: Standard Bathroom Accessibility: Yes  Lives With: Spouse, Family IADL History Homemaking Responsibilities: Yes Prior Function Level of Independence: Independent with basic ADLs, Independent with gait, Independent with transfers, Requires assistive device for independence  Able to Take Stairs?: Yes Driving: No Vocation: Retired Comments: ambulated with RW, no assist with ADLs, iADLs  Vision Baseline Vision/History: Wears glasses Wears Glasses: Reading only Patient Visual Report: No change from baseline Vision Assessment?: Vision impaired-  to be further tested in functional context Perception  Perception: Impaired(perceptual deficits) Praxis Praxis: Intact Cognition Overall Cognitive Status: Impaired/Different from baseline Arousal/Alertness: Awake/alert Orientation Level: Person;Place;Situation Person: Oriented Place: Disoriented Situation: Disoriented Year: Other (Comment)(Unable to name a year) Month: (Self corrected that it isn't Feb, however, unable to name a month) Day of Week: Incorrect Memory: Impaired Memory Impairment: Decreased short term memory;Decreased recall of new information Decreased Short Term Memory: Verbal basic;Functional basic Immediate Memory Recall: Sock;Bed;Blue Memory Recall: Sock;Blue;Bed Memory Recall Sock: Without Cue Memory Recall Blue: Without Cue Memory Recall Bed: Without Cue Attention: Selective Focused Attention: Appears intact Sustained Attention: Appears intact Selective Attention: Impaired Selective Attention Impairment: Verbal basic;Functional basic Awareness: Impaired Awareness Impairment: Intellectual impairment Problem Solving: Impaired Problem Solving Impairment: Verbal complex;Functional complex Safety/Judgment: Impaired Sensation Sensation Additional Comments: Difficult to formally assess due to cognition, however, reports light touch in tact Coordination Gross Motor Movements are Fluid and Coordinated: No Fine Motor Movements are Fluid and Coordinated: No Coordination and Movement Description: UE/LE Ataxia; generaized weakness  Motor  Motor Motor: Hemiplegia;Ataxia Motor - Skilled Clinical Observations: R hemiplegia UE>LE Trunk/Postural Assessment  Cervical Assessment Cervical Assessment: Exceptions to [facility] Thoracic Assessment Thoracic Assessment: Exceptions to WFL(Kyphotic) Lumbar Assessment Lumbar Assessment: Exceptions to WFL(Posterior pelvic tilt) Postural Control Postural Control: Deficits on evaluation  Balance Balance Balance Assessed:  Yes Static Sitting Balance Static Sitting - Level of Assistance: 5: Stand by assistance Static Sitting - Comment/# of Minutes: Sitting EOB Dynamic Sitting Balance Dynamic Sitting - Balance Support: During functional activity;Feet supported Dynamic Sitting - Level of Assistance: 5: Stand by assistance;4: Min assist Sitting balance - Comments: good activation of R UE to prevent lateral LOB  Static Standing Balance Static Standing - Balance Support: During functional activity Static Standing - Level of Assistance: 4: Min assist;3: Mod assist Static Standing - Comment/# of Minutes: Standing to complete LB bathing/dressing Dynamic Standing Balance Dynamic Standing - Balance Support: During functional activity;Left upper extremity supported Dynamic Standing - Level of Assistance: 3: Mod assist Dynamic Standing - Comments: Standing to complete toileting tasks Extremity/Trunk Assessment RUE Assessment RUE Assessment: Exceptions to [facility] RUE Strength RUE Overall Strength Comments: Demonstrates ability for  full ROM during functional task. Very apraxic with decreased strength. Unable to formally assess due to cognitive impairments LUE Assessment LUE Assessment: Within Functional Limits   See Function Navigator for Current Functional Status.   Refer to Care Plan for Long Term Goals  Recommendations for other services: None    Discharge Criteria: Patient will be discharged from OT if patient refuses treatment 3 consecutive times without medical reason, if treatment goals not met, if there is a change in medical status, if patient makes no progress towards goals or if patient is discharged from hospital.  The above assessment, treatment plan, treatment alternatives and goals were discussed and mutually agreed upon: by patient and by family  Ernestina Patches 04/04/2017, 4:03 PM

## 2017-04-04 NOTE — Evaluation (Signed)
Physical Therapy Assessment and Plan  Patient Details  Name: Casey Harrell MRN: [id number] Date of Birth: 08/06/1934  PT Diagnosis: Abnormality of gait, Coordination disorder, Hemiplegia dominant, Muscle weakness and Pain in joint Rehab Potential: Good ELOS: 14-18   Today's Date: 04/04/2017 PT Individual Time: 0800-0915 PT Individual Time Calculation (min): 75 min    Problem List:  Patient Active Problem List   Diagnosis Date Noted  . Left pontine cerebrovascular accident (HCC) 04/03/2017  . History of CVA with residual deficit   . Type 2 diabetes mellitus with peripheral neuropathy (HCC)   . Anemia 03/30/2017  . History of stroke   . Chest pain 02/09/2016  . BPPV (benign paroxysmal positional vertigo) 08/25/2015  . HLD (hyperlipidemia) 08/25/2015  . At high risk for falls 07/01/2015  . Vertigo 06/21/2015  . Paroxysmal atrial fibrillation (HCC) 06/11/2015  . Cerebral infarction due to stenosis of left middle cerebral artery (HCC) 05/20/2015  . Intracranial vascular stenosis 05/20/2015  . Type 2 diabetes mellitus with circulatory disorder (HCC) 05/20/2015  . Type 2 diabetes, uncontrolled, with neuropathy (HCC)   . Abdominal pain   . Partial small bowel obstruction (HCC)   . Benign essential HTN   . Small bowel obstruction (HCC) 05/07/2015  . Hemiplegia, unspecified affecting right dominant side (HCC) 04/08/2015  . Embolic stroke involving posterior cerebral artery (HCC) 04/05/2015  . Orthostatic hypotension   . Hypertensive urgency 04/01/2015  . Fever in adult 04/01/2015  . Acute encephalopathy 04/01/2015  . Confusion   . CVA (cerebral infarction)   . Posterior circulation stroke (HCC)   . Acute ischemic stroke (HCC) 03/31/2015  . Stroke (HCC) 03/31/2015  . CKD (chronic kidney disease) stage 3, GFR 30-59 ml/min (HCC) 04/17/2014  . Gastroenteritis, non-infectious 04/11/2014  . Nausea vomiting and diarrhea 04/10/2014  . Hypokalemia 04/10/2014  . Dehydration 04/09/2014  .  Bronchitis with airway obstruction (HCC) 02/03/2013  . Leukocytosis 02/03/2013  . Fever 02/03/2013  . Acute respiratory failure with hypoxia (HCC) 02/03/2013  . Depression 02/03/2013  . Hyponatremia 02/03/2013  . Obesity (BMI 30-39.9) 01/10/2013  . PVD (peripheral vascular disease)- S/P RCE 06/18/09 12/13/2012  . Sleep apnea- C-pap intol 12/13/2012  . Low risk cardiac nuclear stress test- 2009 and Feb 2011 12/13/2012  . Mild persistent asthma 11/29/2011  . Chronic cough 06/05/2011  . Diabetic neuropathy (HCC) 09/02/2009  . Dyslipidemia 07/13/2009  . Hypothyroidism 11/12/2008  . Type 2 diabetes mellitus, uncontrolled (HCC) 07/15/2008  . Essential hypertension 07/15/2008  . GERD 07/15/2008  . DIVERTICULOSIS, COLON 07/15/2008    Past Medical History:  Past Medical History:  Diagnosis Date  . ASTHMA 07/15/2008  . COPD (chronic obstructive pulmonary disease) (HCC)   . DIABETES-TYPE 2 07/15/2008  . DIABETIC PERIPHERAL NEUROPATHY 09/02/2009  . GERD 07/15/2008  . HYPERTENSION 07/15/2008  . Normal cardiac stress test    low risk Nuc 2009, Feb 2011  . Paroxysmal atrial fibrillation (HCC)   . PVD (peripheral vascular disease) (HCC) 06/18/09   RCE  . Stroke (HCC)   . Vertigo    Past Surgical History:  Past Surgical History:  Procedure Laterality Date  . ABDOMINAL HYSTERECTOMY  1971  . APPENDECTOMY  1971  . CAROTID ENDARTERECTOMY  06/18/09   RCE  . CESAREAN SECTION    . colectomy and colostomy    . FLEXIBLE SIGMOIDOSCOPY     diverticulitis  . FOOT SURGERY     right foot X 2  . LAPAROTOMY     X 3 with adhesions lysis  .   Physical Therapy Assessment and Plan  Patient Details  Name: Casey Harrell MRN: [id number] Date of Birth: 08/06/1934  PT Diagnosis: Abnormality of gait, Coordination disorder, Hemiplegia dominant, Muscle weakness and Pain in joint Rehab Potential: Good ELOS: 14-18   Today's Date: 04/04/2017 PT Individual Time: 0800-0915 PT Individual Time Calculation (min): 75 min    Problem List:  Patient Active Problem List   Diagnosis Date Noted  . Left pontine cerebrovascular accident (HCC) 04/03/2017  . History of CVA with residual deficit   . Type 2 diabetes mellitus with peripheral neuropathy (HCC)   . Anemia 03/30/2017  . History of stroke   . Chest pain 02/09/2016  . BPPV (benign paroxysmal positional vertigo) 08/25/2015  . HLD (hyperlipidemia) 08/25/2015  . At high risk for falls 07/01/2015  . Vertigo 06/21/2015  . Paroxysmal atrial fibrillation (HCC) 06/11/2015  . Cerebral infarction due to stenosis of left middle cerebral artery (HCC) 05/20/2015  . Intracranial vascular stenosis 05/20/2015  . Type 2 diabetes mellitus with circulatory disorder (HCC) 05/20/2015  . Type 2 diabetes, uncontrolled, with neuropathy (HCC)   . Abdominal pain   . Partial small bowel obstruction (HCC)   . Benign essential HTN   . Small bowel obstruction (HCC) 05/07/2015  . Hemiplegia, unspecified affecting right dominant side (HCC) 04/08/2015  . Embolic stroke involving posterior cerebral artery (HCC) 04/05/2015  . Orthostatic hypotension   . Hypertensive urgency 04/01/2015  . Fever in adult 04/01/2015  . Acute encephalopathy 04/01/2015  . Confusion   . CVA (cerebral infarction)   . Posterior circulation stroke (HCC)   . Acute ischemic stroke (HCC) 03/31/2015  . Stroke (HCC) 03/31/2015  . CKD (chronic kidney disease) stage 3, GFR 30-59 ml/min (HCC) 04/17/2014  . Gastroenteritis, non-infectious 04/11/2014  . Nausea vomiting and diarrhea 04/10/2014  . Hypokalemia 04/10/2014  . Dehydration 04/09/2014  .  Bronchitis with airway obstruction (HCC) 02/03/2013  . Leukocytosis 02/03/2013  . Fever 02/03/2013  . Acute respiratory failure with hypoxia (HCC) 02/03/2013  . Depression 02/03/2013  . Hyponatremia 02/03/2013  . Obesity (BMI 30-39.9) 01/10/2013  . PVD (peripheral vascular disease)- S/P RCE 06/18/09 12/13/2012  . Sleep apnea- C-pap intol 12/13/2012  . Low risk cardiac nuclear stress test- 2009 and Feb 2011 12/13/2012  . Mild persistent asthma 11/29/2011  . Chronic cough 06/05/2011  . Diabetic neuropathy (HCC) 09/02/2009  . Dyslipidemia 07/13/2009  . Hypothyroidism 11/12/2008  . Type 2 diabetes mellitus, uncontrolled (HCC) 07/15/2008  . Essential hypertension 07/15/2008  . GERD 07/15/2008  . DIVERTICULOSIS, COLON 07/15/2008    Past Medical History:  Past Medical History:  Diagnosis Date  . ASTHMA 07/15/2008  . COPD (chronic obstructive pulmonary disease) (HCC)   . DIABETES-TYPE 2 07/15/2008  . DIABETIC PERIPHERAL NEUROPATHY 09/02/2009  . GERD 07/15/2008  . HYPERTENSION 07/15/2008  . Normal cardiac stress test    low risk Nuc 2009, Feb 2011  . Paroxysmal atrial fibrillation (HCC)   . PVD (peripheral vascular disease) (HCC) 06/18/09   RCE  . Stroke (HCC)   . Vertigo    Past Surgical History:  Past Surgical History:  Procedure Laterality Date  . ABDOMINAL HYSTERECTOMY  1971  . APPENDECTOMY  1971  . CAROTID ENDARTERECTOMY  06/18/09   RCE  . CESAREAN SECTION    . colectomy and colostomy    . FLEXIBLE SIGMOIDOSCOPY     diverticulitis  . FOOT SURGERY     right foot X 2  . LAPAROTOMY     X 3 with adhesions lysis  .   Physical Therapy Assessment and Plan  Patient Details  Name: Casey Harrell MRN: [id number] Date of Birth: 08/06/1934  PT Diagnosis: Abnormality of gait, Coordination disorder, Hemiplegia dominant, Muscle weakness and Pain in joint Rehab Potential: Good ELOS: 14-18   Today's Date: 04/04/2017 PT Individual Time: 0800-0915 PT Individual Time Calculation (min): 75 min    Problem List:  Patient Active Problem List   Diagnosis Date Noted  . Left pontine cerebrovascular accident (HCC) 04/03/2017  . History of CVA with residual deficit   . Type 2 diabetes mellitus with peripheral neuropathy (HCC)   . Anemia 03/30/2017  . History of stroke   . Chest pain 02/09/2016  . BPPV (benign paroxysmal positional vertigo) 08/25/2015  . HLD (hyperlipidemia) 08/25/2015  . At high risk for falls 07/01/2015  . Vertigo 06/21/2015  . Paroxysmal atrial fibrillation (HCC) 06/11/2015  . Cerebral infarction due to stenosis of left middle cerebral artery (HCC) 05/20/2015  . Intracranial vascular stenosis 05/20/2015  . Type 2 diabetes mellitus with circulatory disorder (HCC) 05/20/2015  . Type 2 diabetes, uncontrolled, with neuropathy (HCC)   . Abdominal pain   . Partial small bowel obstruction (HCC)   . Benign essential HTN   . Small bowel obstruction (HCC) 05/07/2015  . Hemiplegia, unspecified affecting right dominant side (HCC) 04/08/2015  . Embolic stroke involving posterior cerebral artery (HCC) 04/05/2015  . Orthostatic hypotension   . Hypertensive urgency 04/01/2015  . Fever in adult 04/01/2015  . Acute encephalopathy 04/01/2015  . Confusion   . CVA (cerebral infarction)   . Posterior circulation stroke (HCC)   . Acute ischemic stroke (HCC) 03/31/2015  . Stroke (HCC) 03/31/2015  . CKD (chronic kidney disease) stage 3, GFR 30-59 ml/min (HCC) 04/17/2014  . Gastroenteritis, non-infectious 04/11/2014  . Nausea vomiting and diarrhea 04/10/2014  . Hypokalemia 04/10/2014  . Dehydration 04/09/2014  .  Bronchitis with airway obstruction (HCC) 02/03/2013  . Leukocytosis 02/03/2013  . Fever 02/03/2013  . Acute respiratory failure with hypoxia (HCC) 02/03/2013  . Depression 02/03/2013  . Hyponatremia 02/03/2013  . Obesity (BMI 30-39.9) 01/10/2013  . PVD (peripheral vascular disease)- S/P RCE 06/18/09 12/13/2012  . Sleep apnea- C-pap intol 12/13/2012  . Low risk cardiac nuclear stress test- 2009 and Feb 2011 12/13/2012  . Mild persistent asthma 11/29/2011  . Chronic cough 06/05/2011  . Diabetic neuropathy (HCC) 09/02/2009  . Dyslipidemia 07/13/2009  . Hypothyroidism 11/12/2008  . Type 2 diabetes mellitus, uncontrolled (HCC) 07/15/2008  . Essential hypertension 07/15/2008  . GERD 07/15/2008  . DIVERTICULOSIS, COLON 07/15/2008    Past Medical History:  Past Medical History:  Diagnosis Date  . ASTHMA 07/15/2008  . COPD (chronic obstructive pulmonary disease) (HCC)   . DIABETES-TYPE 2 07/15/2008  . DIABETIC PERIPHERAL NEUROPATHY 09/02/2009  . GERD 07/15/2008  . HYPERTENSION 07/15/2008  . Normal cardiac stress test    low risk Nuc 2009, Feb 2011  . Paroxysmal atrial fibrillation (HCC)   . PVD (peripheral vascular disease) (HCC) 06/18/09   RCE  . Stroke (HCC)   . Vertigo    Past Surgical History:  Past Surgical History:  Procedure Laterality Date  . ABDOMINAL HYSTERECTOMY  1971  . APPENDECTOMY  1971  . CAROTID ENDARTERECTOMY  06/18/09   RCE  . CESAREAN SECTION    . colectomy and colostomy    . FLEXIBLE SIGMOIDOSCOPY     diverticulitis  . FOOT SURGERY     right foot X 2  . LAPAROTOMY     X 3 with adhesions lysis  .   Physical Therapy Assessment and Plan  Patient Details  Name: Casey Harrell MRN: [id number] Date of Birth: 08/06/1934  PT Diagnosis: Abnormality of gait, Coordination disorder, Hemiplegia dominant, Muscle weakness and Pain in joint Rehab Potential: Good ELOS: 14-18   Today's Date: 04/04/2017 PT Individual Time: 0800-0915 PT Individual Time Calculation (min): 75 min    Problem List:  Patient Active Problem List   Diagnosis Date Noted  . Left pontine cerebrovascular accident (HCC) 04/03/2017  . History of CVA with residual deficit   . Type 2 diabetes mellitus with peripheral neuropathy (HCC)   . Anemia 03/30/2017  . History of stroke   . Chest pain 02/09/2016  . BPPV (benign paroxysmal positional vertigo) 08/25/2015  . HLD (hyperlipidemia) 08/25/2015  . At high risk for falls 07/01/2015  . Vertigo 06/21/2015  . Paroxysmal atrial fibrillation (HCC) 06/11/2015  . Cerebral infarction due to stenosis of left middle cerebral artery (HCC) 05/20/2015  . Intracranial vascular stenosis 05/20/2015  . Type 2 diabetes mellitus with circulatory disorder (HCC) 05/20/2015  . Type 2 diabetes, uncontrolled, with neuropathy (HCC)   . Abdominal pain   . Partial small bowel obstruction (HCC)   . Benign essential HTN   . Small bowel obstruction (HCC) 05/07/2015  . Hemiplegia, unspecified affecting right dominant side (HCC) 04/08/2015  . Embolic stroke involving posterior cerebral artery (HCC) 04/05/2015  . Orthostatic hypotension   . Hypertensive urgency 04/01/2015  . Fever in adult 04/01/2015  . Acute encephalopathy 04/01/2015  . Confusion   . CVA (cerebral infarction)   . Posterior circulation stroke (HCC)   . Acute ischemic stroke (HCC) 03/31/2015  . Stroke (HCC) 03/31/2015  . CKD (chronic kidney disease) stage 3, GFR 30-59 ml/min (HCC) 04/17/2014  . Gastroenteritis, non-infectious 04/11/2014  . Nausea vomiting and diarrhea 04/10/2014  . Hypokalemia 04/10/2014  . Dehydration 04/09/2014  .  Bronchitis with airway obstruction (HCC) 02/03/2013  . Leukocytosis 02/03/2013  . Fever 02/03/2013  . Acute respiratory failure with hypoxia (HCC) 02/03/2013  . Depression 02/03/2013  . Hyponatremia 02/03/2013  . Obesity (BMI 30-39.9) 01/10/2013  . PVD (peripheral vascular disease)- S/P RCE 06/18/09 12/13/2012  . Sleep apnea- C-pap intol 12/13/2012  . Low risk cardiac nuclear stress test- 2009 and Feb 2011 12/13/2012  . Mild persistent asthma 11/29/2011  . Chronic cough 06/05/2011  . Diabetic neuropathy (HCC) 09/02/2009  . Dyslipidemia 07/13/2009  . Hypothyroidism 11/12/2008  . Type 2 diabetes mellitus, uncontrolled (HCC) 07/15/2008  . Essential hypertension 07/15/2008  . GERD 07/15/2008  . DIVERTICULOSIS, COLON 07/15/2008    Past Medical History:  Past Medical History:  Diagnosis Date  . ASTHMA 07/15/2008  . COPD (chronic obstructive pulmonary disease) (HCC)   . DIABETES-TYPE 2 07/15/2008  . DIABETIC PERIPHERAL NEUROPATHY 09/02/2009  . GERD 07/15/2008  . HYPERTENSION 07/15/2008  . Normal cardiac stress test    low risk Nuc 2009, Feb 2011  . Paroxysmal atrial fibrillation (HCC)   . PVD (peripheral vascular disease) (HCC) 06/18/09   RCE  . Stroke (HCC)   . Vertigo    Past Surgical History:  Past Surgical History:  Procedure Laterality Date  . ABDOMINAL HYSTERECTOMY  1971  . APPENDECTOMY  1971  . CAROTID ENDARTERECTOMY  06/18/09   RCE  . CESAREAN SECTION    . colectomy and colostomy    . FLEXIBLE SIGMOIDOSCOPY     diverticulitis  . FOOT SURGERY     right foot X 2  . LAPAROTOMY     X 3 with adhesions lysis  .

## 2017-04-04 NOTE — Evaluation (Signed)
Speech Language Pathology Assessment and Plan  Patient Details  Name: Casey Harrell MRN: [id number] Date of Birth: Aug 21, 1934  SLP Diagnosis: Cognitive Impairments  Rehab Potential: Good ELOS: 2 to 2.5 weeks    Today's Date: 04/04/2017 SLP Individual Time: 1100-1200 SLP Individual Time Calculation (min): 60 min   Problem List:  Patient Active Problem List   Diagnosis Date Noted  . Left pontine cerebrovascular accident ([place]) 04/03/2017  . History of CVA with residual deficit   . Type 2 diabetes mellitus with peripheral neuropathy (HCC)   . Anemia 03/30/2017  . History of stroke   . Chest pain 02/09/2016  . BPPV (benign paroxysmal positional vertigo) 08/25/2015  . HLD (hyperlipidemia) 08/25/2015  . At high risk for falls 07/01/2015  . Vertigo 06/21/2015  . Paroxysmal atrial fibrillation ([place]) 06/11/2015  . Cerebral infarction due to stenosis of left middle cerebral artery ([place]) 05/20/2015  . Intracranial vascular stenosis 05/20/2015  . Type 2 diabetes mellitus with circulatory disorder ([place]) 05/20/2015  . Type 2 diabetes, uncontrolled, with neuropathy ([place])   . Abdominal pain   . Partial small bowel obstruction ([place])   . Benign essential HTN   . Small bowel obstruction ([place]) 05/07/2015  . Hemiplegia, unspecified affecting right dominant side ([place]) 04/08/2015  . Embolic stroke involving posterior cerebral artery ([place]) 04/05/2015  . Orthostatic hypotension   . Hypertensive urgency 04/01/2015  . Fever in adult 04/01/2015  . Acute encephalopathy 04/01/2015  . Confusion   . CVA (cerebral infarction)   . Posterior circulation stroke ([place])   . Acute ischemic stroke ([place]) 03/31/2015  . Stroke ([place]) 03/31/2015  . CKD (chronic kidney disease) stage 3, GFR 30-59 ml/min (HCC) 04/17/2014  . Gastroenteritis, non-infectious 04/11/2014  . Nausea vomiting and diarrhea 04/10/2014  . Hypokalemia 04/10/2014  . Dehydration 04/09/2014  . Bronchitis with airway obstruction ([place]) 02/03/2013   . Leukocytosis 02/03/2013  . Fever 02/03/2013  . Acute respiratory failure with hypoxia ([place]) 02/03/2013  . Depression 02/03/2013  . Hyponatremia 02/03/2013  . Obesity (BMI 30-39.9) 01/10/2013  . PVD (peripheral vascular disease)- S/P RCE 06/18/09 12/13/2012  . Sleep apnea- C-pap intol 12/13/2012  . Low risk cardiac nuclear stress test- 2009 and Feb 2011 12/13/2012  . Mild persistent asthma 11/29/2011  . Chronic cough 06/05/2011  . Diabetic neuropathy ([place]) 09/02/2009  . Dyslipidemia 07/13/2009  . Hypothyroidism 11/12/2008  . Type 2 diabetes mellitus, uncontrolled ([place]) 07/15/2008  . Essential hypertension 07/15/2008  . GERD 07/15/2008  . DIVERTICULOSIS, COLON 07/15/2008   Past Medical History:  Past Medical History:  Diagnosis Date  . ASTHMA 07/15/2008  . COPD (chronic obstructive pulmonary disease) ([place])   . DIABETES-TYPE 2 07/15/2008  . DIABETIC PERIPHERAL NEUROPATHY 09/02/2009  . GERD 07/15/2008  . HYPERTENSION 07/15/2008  . Normal cardiac stress test    low risk Nuc 2009, Feb 2011  . Paroxysmal atrial fibrillation (HCC)   . PVD (peripheral vascular disease) ([place]) 06/18/09   RCE  . Stroke ([place])   . Vertigo    Past Surgical History:  Past Surgical History:  Procedure Laterality Date  . ABDOMINAL HYSTERECTOMY  1971  . APPENDECTOMY  1971  . CAROTID ENDARTERECTOMY  06/18/09   RCE  . CESAREAN SECTION    . colectomy and colostomy    . FLEXIBLE SIGMOIDOSCOPY     diverticulitis  . FOOT SURGERY     right foot X 2  . LAPAROTOMY     X 3 with adhesions lysis  . TONSILLECTOMY    .  Speech Language Pathology Assessment and Plan  Patient Details  Name: Casey Harrell MRN: [id number] Date of Birth: Aug 21, 1934  SLP Diagnosis: Cognitive Impairments  Rehab Potential: Good ELOS: 2 to 2.5 weeks    Today's Date: 04/04/2017 SLP Individual Time: 1100-1200 SLP Individual Time Calculation (min): 60 min   Problem List:  Patient Active Problem List   Diagnosis Date Noted  . Left pontine cerebrovascular accident ([place]) 04/03/2017  . History of CVA with residual deficit   . Type 2 diabetes mellitus with peripheral neuropathy (HCC)   . Anemia 03/30/2017  . History of stroke   . Chest pain 02/09/2016  . BPPV (benign paroxysmal positional vertigo) 08/25/2015  . HLD (hyperlipidemia) 08/25/2015  . At high risk for falls 07/01/2015  . Vertigo 06/21/2015  . Paroxysmal atrial fibrillation ([place]) 06/11/2015  . Cerebral infarction due to stenosis of left middle cerebral artery ([place]) 05/20/2015  . Intracranial vascular stenosis 05/20/2015  . Type 2 diabetes mellitus with circulatory disorder ([place]) 05/20/2015  . Type 2 diabetes, uncontrolled, with neuropathy ([place])   . Abdominal pain   . Partial small bowel obstruction ([place])   . Benign essential HTN   . Small bowel obstruction ([place]) 05/07/2015  . Hemiplegia, unspecified affecting right dominant side ([place]) 04/08/2015  . Embolic stroke involving posterior cerebral artery ([place]) 04/05/2015  . Orthostatic hypotension   . Hypertensive urgency 04/01/2015  . Fever in adult 04/01/2015  . Acute encephalopathy 04/01/2015  . Confusion   . CVA (cerebral infarction)   . Posterior circulation stroke ([place])   . Acute ischemic stroke ([place]) 03/31/2015  . Stroke ([place]) 03/31/2015  . CKD (chronic kidney disease) stage 3, GFR 30-59 ml/min (HCC) 04/17/2014  . Gastroenteritis, non-infectious 04/11/2014  . Nausea vomiting and diarrhea 04/10/2014  . Hypokalemia 04/10/2014  . Dehydration 04/09/2014  . Bronchitis with airway obstruction ([place]) 02/03/2013   . Leukocytosis 02/03/2013  . Fever 02/03/2013  . Acute respiratory failure with hypoxia ([place]) 02/03/2013  . Depression 02/03/2013  . Hyponatremia 02/03/2013  . Obesity (BMI 30-39.9) 01/10/2013  . PVD (peripheral vascular disease)- S/P RCE 06/18/09 12/13/2012  . Sleep apnea- C-pap intol 12/13/2012  . Low risk cardiac nuclear stress test- 2009 and Feb 2011 12/13/2012  . Mild persistent asthma 11/29/2011  . Chronic cough 06/05/2011  . Diabetic neuropathy ([place]) 09/02/2009  . Dyslipidemia 07/13/2009  . Hypothyroidism 11/12/2008  . Type 2 diabetes mellitus, uncontrolled ([place]) 07/15/2008  . Essential hypertension 07/15/2008  . GERD 07/15/2008  . DIVERTICULOSIS, COLON 07/15/2008   Past Medical History:  Past Medical History:  Diagnosis Date  . ASTHMA 07/15/2008  . COPD (chronic obstructive pulmonary disease) ([place])   . DIABETES-TYPE 2 07/15/2008  . DIABETIC PERIPHERAL NEUROPATHY 09/02/2009  . GERD 07/15/2008  . HYPERTENSION 07/15/2008  . Normal cardiac stress test    low risk Nuc 2009, Feb 2011  . Paroxysmal atrial fibrillation (HCC)   . PVD (peripheral vascular disease) ([place]) 06/18/09   RCE  . Stroke ([place])   . Vertigo    Past Surgical History:  Past Surgical History:  Procedure Laterality Date  . ABDOMINAL HYSTERECTOMY  1971  . APPENDECTOMY  1971  . CAROTID ENDARTERECTOMY  06/18/09   RCE  . CESAREAN SECTION    . colectomy and colostomy    . FLEXIBLE SIGMOIDOSCOPY     diverticulitis  . FOOT SURGERY     right foot X 2  . LAPAROTOMY     X 3 with adhesions lysis  . TONSILLECTOMY    .  Speech Language Pathology Assessment and Plan  Patient Details  Name: Casey Harrell MRN: [id number] Date of Birth: Aug 21, 1934  SLP Diagnosis: Cognitive Impairments  Rehab Potential: Good ELOS: 2 to 2.5 weeks    Today's Date: 04/04/2017 SLP Individual Time: 1100-1200 SLP Individual Time Calculation (min): 60 min   Problem List:  Patient Active Problem List   Diagnosis Date Noted  . Left pontine cerebrovascular accident ([place]) 04/03/2017  . History of CVA with residual deficit   . Type 2 diabetes mellitus with peripheral neuropathy (HCC)   . Anemia 03/30/2017  . History of stroke   . Chest pain 02/09/2016  . BPPV (benign paroxysmal positional vertigo) 08/25/2015  . HLD (hyperlipidemia) 08/25/2015  . At high risk for falls 07/01/2015  . Vertigo 06/21/2015  . Paroxysmal atrial fibrillation ([place]) 06/11/2015  . Cerebral infarction due to stenosis of left middle cerebral artery ([place]) 05/20/2015  . Intracranial vascular stenosis 05/20/2015  . Type 2 diabetes mellitus with circulatory disorder ([place]) 05/20/2015  . Type 2 diabetes, uncontrolled, with neuropathy ([place])   . Abdominal pain   . Partial small bowel obstruction ([place])   . Benign essential HTN   . Small bowel obstruction ([place]) 05/07/2015  . Hemiplegia, unspecified affecting right dominant side ([place]) 04/08/2015  . Embolic stroke involving posterior cerebral artery ([place]) 04/05/2015  . Orthostatic hypotension   . Hypertensive urgency 04/01/2015  . Fever in adult 04/01/2015  . Acute encephalopathy 04/01/2015  . Confusion   . CVA (cerebral infarction)   . Posterior circulation stroke ([place])   . Acute ischemic stroke ([place]) 03/31/2015  . Stroke ([place]) 03/31/2015  . CKD (chronic kidney disease) stage 3, GFR 30-59 ml/min (HCC) 04/17/2014  . Gastroenteritis, non-infectious 04/11/2014  . Nausea vomiting and diarrhea 04/10/2014  . Hypokalemia 04/10/2014  . Dehydration 04/09/2014  . Bronchitis with airway obstruction ([place]) 02/03/2013   . Leukocytosis 02/03/2013  . Fever 02/03/2013  . Acute respiratory failure with hypoxia ([place]) 02/03/2013  . Depression 02/03/2013  . Hyponatremia 02/03/2013  . Obesity (BMI 30-39.9) 01/10/2013  . PVD (peripheral vascular disease)- S/P RCE 06/18/09 12/13/2012  . Sleep apnea- C-pap intol 12/13/2012  . Low risk cardiac nuclear stress test- 2009 and Feb 2011 12/13/2012  . Mild persistent asthma 11/29/2011  . Chronic cough 06/05/2011  . Diabetic neuropathy ([place]) 09/02/2009  . Dyslipidemia 07/13/2009  . Hypothyroidism 11/12/2008  . Type 2 diabetes mellitus, uncontrolled ([place]) 07/15/2008  . Essential hypertension 07/15/2008  . GERD 07/15/2008  . DIVERTICULOSIS, COLON 07/15/2008   Past Medical History:  Past Medical History:  Diagnosis Date  . ASTHMA 07/15/2008  . COPD (chronic obstructive pulmonary disease) ([place])   . DIABETES-TYPE 2 07/15/2008  . DIABETIC PERIPHERAL NEUROPATHY 09/02/2009  . GERD 07/15/2008  . HYPERTENSION 07/15/2008  . Normal cardiac stress test    low risk Nuc 2009, Feb 2011  . Paroxysmal atrial fibrillation (HCC)   . PVD (peripheral vascular disease) ([place]) 06/18/09   RCE  . Stroke ([place])   . Vertigo    Past Surgical History:  Past Surgical History:  Procedure Laterality Date  . ABDOMINAL HYSTERECTOMY  1971  . APPENDECTOMY  1971  . CAROTID ENDARTERECTOMY  06/18/09   RCE  . CESAREAN SECTION    . colectomy and colostomy    . FLEXIBLE SIGMOIDOSCOPY     diverticulitis  . FOOT SURGERY     right foot X 2  . LAPAROTOMY     X 3 with adhesions lysis  . TONSILLECTOMY    .

## 2017-04-04 NOTE — Progress Notes (Signed)
PMR Admission Coordinator Pre-Admission Assessment  Patient: Casey Harrell is an 82 y.o., female MRN: [id number] DOB: 11/22/1934 Height: 5\' 1"  (154.9 cm) Weight: 65.3 kg (143 lb 15.4 oz)                                                                                                                                                  Insurance Information HMO:     PPO: X     PCP:      IPA:      80/20:      OTHER:  PRIMARY: UHC Medicare       Policy#: [id number]      Subscriber: Self CM Name: Casey Harrell      Phone#: [phone]     Fax#: [phone] Pre-Cert#: [id number] given by Casey Harrell       Employer: Retired  Benefits:  Phone #: [phone]     Name: Verified online at [email or web address] Eff. Date: 05/08/16     Deduct: $0      Out of Pocket Max: $4500      Life Max: N/A CIR: $345 a day, days 1-5      SNF: $0 a day, days 1-20; $160 a day, days 21-49; $0 a day, days 50-100 Outpatient: Necessity      Co-Pay: $40 per visit  Home Health: Necessity, 100%      Co-Pay: None DME: 80%     Co-Pay: 20% Providers: In-network   SECONDARY: None      Policy#:       Subscriber:  CM Name:       Phone#:      Fax#:  Pre-Cert#:       Employer:  Benefits:  Phone #:      Name:  Eff. Date:      Deduct:       Out of Pocket Max:       Life Max:  CIR:       SNF:  Outpatient:      Co-Pay:  Home Health:       Co-Pay:  DME:      Co-Pay:   Medicaid Application Date:       Case Manager:  Disability Application Date:       Case Worker:   Emergency Contact Information        Contact Information    Name Relation Home Work Mobile   Casey Harrell,Casey Harrell Spouse [phone]     Casey Harrell,Casey Harrell Son   [phone]   Casey Harrell, Casey Harrell [phone]       Current Medical History  Patient Admitting Diagnosis: Small acute infarct of the left pons  History of Present Illness: Casey Harrell a 82 y.o.right handed femalewith history of hypertension, hyperlipidemia, diabetes mellitus with neuropathy, atrial  fibrillation maintained on Eliquisand CVA embolic involving posterior cerebral artery with right-sided weaknessas well as  some memory impairment inDecember 2016 and received inpatient rehabilitation services. Per chart reviewand patient report,she lives with spouse, who has cancer. Independent with intermittent use of assistive device, rolling walker. Did not need assist for ADLs. One level home with 6 steps to entry. Presented 03/30/2017 with increasing right sided weakness and difficulty with speech. Cranial CT scanreviewed, unremarkable for acute process. Per report, multifocal old infarcts. Patient did not receive TPA. Troponin negative. MRI showed small acute infarct of the left pons measuring approximately 8 x 3 mm. No associated hemorrhage. MRA motion degraded showed moderate to severe stenosis of the supraclinoid right ICA. No occlusion or high-grade stenosis of the intracranial arch vessels. Echocardiogram with ejection fraction of 60% no wall motion abnormalities. Neurology consulted with aspirin added in addition toEliquisthat patient was on prior to admission. Tolerating a regular texture diet with thin liquids. Physical and occupational therapy evaluations completed with recommendations of physical medicine rehabilitation consult.Patient was admitted for a comprehensive rehabilitation program 04/03/17.   NIH Total: 7  Past Medical History      Past Medical History:  Diagnosis Date  . ASTHMA 07/15/2008  . COPD (chronic obstructive pulmonary disease) (HCC)   . DIABETES-TYPE 2 07/15/2008  . DIABETIC PERIPHERAL NEUROPATHY 09/02/2009  . GERD 07/15/2008  . HYPERTENSION 07/15/2008  . Normal cardiac stress test    low risk Nuc 2009, Feb 2011  . Paroxysmal atrial fibrillation (HCC)   . PVD (peripheral vascular disease) (HCC) 06/18/09   RCE  . Stroke (HCC)   . Vertigo     Family History  family history includes Arthritis in her mother; Asthma in her sister; Cancer in her  paternal grandfather; Cancer (age of onset: 74) in her brother; Celiac disease in her sister; Diabetes in her mother; Heart disease in her brother, father, mother, sister, and sister; Stroke in her maternal grandfather.  Prior Rehab/Hospitalizations:  Has the patient had major surgery during 100 days prior to admission? No  Current Medications   Current Facility-Administered Medications:  .  acetaminophen (TYLENOL) tablet 650 mg, 650 mg, Oral, Q4H PRN, 650 mg at 04/01/17 9629 **OR** acetaminophen (TYLENOL) solution 650 mg, 650 mg, Per Tube, Q4H PRN **OR** acetaminophen (TYLENOL) suppository 650 mg, 650 mg, Rectal, Q4H PRN, Casey Grippe, Casey Harrell .  amLODipine (NORVASC) tablet 5 mg, 5 mg, Oral, Daily, Casey Harrell, Casey Neri, Casey Harrell, 5 mg at 04/03/17 0939 .  apixaban (ELIQUIS) tablet 5 mg, 5 mg, Oral, BID, Casey Harrell, Casey Harrell, 5 mg at 04/03/17 0940 .  aspirin EC tablet 81 mg, 81 mg, Oral, Daily, Casey Plan, Casey Harrell, 81 mg at 04/03/17 0940 .  atorvastatin (LIPITOR) tablet 40 mg, 40 mg, Oral, Daily, Casey Grippe, Casey Harrell, 40 mg at 04/03/17 0939 .  carvedilol (COREG) tablet 6.25 mg, 6.25 mg, Oral, BID WC, Casey Grippe, Casey Harrell, 6.25 mg at 04/03/17 0939 .  diclofenac sodium (VOLTAREN) 1 % transdermal gel 2 g, 2 g, Topical, QID, Casey Harrell, Casey Ram, Casey Harrell, 2 g at 04/03/17 0940 .  DULoxetine (CYMBALTA) DR capsule 30 mg, 30 mg, Oral, BID, Casey Grippe, Casey Harrell, 30 mg at 04/03/17 0939 .  insulin aspart (novoLOG) injection 0-9 Units, 0-9 Units, Subcutaneous, TID WC, Casey Harrell, Casey Ram, Casey Harrell, 7 Units at 04/03/17 1209 .  insulin detemir (LEVEMIR) injection 30 Units, 30 Units, Subcutaneous, BID, Casey Harrell, Casey Ram, Casey Harrell .  ipratropium-albuterol (DUONEB) 0.5-2.5 (3) MG/3ML nebulizer solution 3 mL, 3 mL, Nebulization, Q6H PRN, Casey Grippe, Casey Harrell .  irbesartan (AVAPRO) tablet 150 mg, 150 mg, Oral, Daily, Casey Grippe, Casey Harrell, 150  mg at 04/03/17 0939 .  levothyroxine (SYNTHROID, LEVOTHROID) tablet 50 mcg, 50 mcg, Oral, QAC breakfast, Casey Grippe, Casey Harrell, 50  mcg at 04/03/17 [phone] .  meclizine (ANTIVERT) tablet 25 mg, 25 mg, Oral, TID PRN, Casey Grippe, Casey Harrell .  ondansetron [facility]) tablet 4 mg, 4 mg, Oral, Q8H PRN, Casey Grippe, Casey Harrell .  pantoprazole (PROTONIX) EC tablet 40 mg, 40 mg, Oral, BID, Casey Grippe, Casey Harrell, 40 mg at 04/03/17 0939 .  pregabalin (LYRICA) capsule 150 mg, 150 mg, Oral, Daily, Casey Grippe, Casey Harrell, 150 mg at 04/03/17 0940  Patients Current Diet: Diet heart healthy/carb modified Room service appropriate? Yes; Fluid consistency: Thin Diet - low sodium heart healthy  Precautions / Restrictions Precautions Precautions: Fall Precaution Comments: Rankle bruising Restrictions Weight Bearing Restrictions: No   Has the patient had 2 or more falls or a fall with injury in the past year?Yes  Prior Activity Level Limited Community (1-2x/wk): Prior to admission patient was independent at home with intermittent use of a walker; however, she reported holding on the the furniture when she walked.  Additionally, she managed her meds and did the cooking.    Home Assistive Devices / Equipment Home Assistive Devices/Equipment: Medical laboratory scientific officer (specify quad or straight), Wheelchair Home Equipment: Walker - 4 wheels, Bedside commode, Shower seat - built in, [employer] - tub/shower, Hand held shower head  Prior Device Use: Indicate devices/aids used by the patient prior to current illness, exacerbation or injury? Walker, intermittently   Prior Functional Level Prior Function Level of Independence: Independent with assistive device(s) Comments: ambulated with RW, no assist with ADLs, iADLs   Self Care: Did the patient need help bathing, dressing, using the toilet or eating? Independent  Indoor Mobility: Did the patient need assistance with walking from room to room (with or without device)? Independent  Stairs: Did the patient need assistance with internal or external stairs (with or without device)? Independent  Functional Cognition: Did the patient need  help planning regular tasks such as shopping or remembering to take medications? Independent  Current Functional Level Cognition  Arousal/Alertness: Awake/Harrell Overall Cognitive Status: Impaired/Different from baseline Current Attention Level: Selective Orientation Level: Oriented X4 General Comments: pt with improved comprehension and sequencing Attention: Focused, Sustained Focused Attention: Appears intact Sustained Attention: Appears intact Memory: Impaired Memory Impairment: Decreased short term memory Decreased Short Term Memory: Verbal basic, Functional basic(pt did not recall OT having visited her this morning) Awareness: Appears intact    Extremity Assessment (includes Sensation/Coordination)  Upper Extremity Assessment: RUE deficits/detail RUE Deficits / Details: grossly 2/5, limited grip strength. Pt reports sensation intact RUE: Unable to fully assess due to pain RUE Coordination: decreased fine motor, decreased gross motor  Lower Extremity Assessment: Defer to PT evaluation RLE Deficits / Details: R knee and ankle ROM limited by pain s/p fall, difficult to assess hip ROM and strength due to increased knee pain with movement,   RLE: Unable to fully assess due to pain RLE Sensation: decreased light touch, decreased proprioception RLE Coordination: decreased fine motor, decreased gross motor    ADLs  Overall ADL's : Needs assistance/impaired Eating/Feeding: Moderate assistance, Sitting Grooming: Moderate assistance, Sitting Grooming Details (indicate cue type and reason): Pt having just finished brushing her teeth. Daughter and pt reporting she was able to sit in recliner and brush her teeth without much assistance.  Upper Body Bathing: Maximal assistance, Sitting Lower Body Bathing: Maximal assistance, +2 for physical assistance, Sit to/from stand Upper Body Dressing : Maximal assistance, Sitting Lower Body Dressing: Maximal assistance, +  2 for physical  assistance, Sit to/from stand Toilet Transfer: Maximal assistance, +2 for physical assistance, Stand-pivot, BSC Toilet Transfer Details (indicate cue type and reason): Simulated by transfer EOB>chair Functional mobility during ADLs: Maximal assistance, Rolling walker, Cueing for sequencing(for stand pivot only) General ADL Comments: Focused session on RUE excercises to increase strength and FM skills. Provided handouts on FM excercises and theraputty.  Pt also performing lateral lean to L to scoot RLE toward edge of reclienr before sit<>stand; performed three times with Mod A to bring R hip forward.     Mobility  Overal bed mobility: Needs Assistance Bed Mobility: Rolling, Sit to Sidelying Rolling: Min assist Sidelying to sit: Mod assist Supine to sit: Mod assist Sit to supine: Max assist Sit to sidelying: Min assist General bed mobility comments: modA for trunk elevation to sit EOB, encouraged pt to pull up with R hand    Transfers  Overall transfer level: Needs assistance Equipment used: Rolling walker (2 wheeled) Transfers: Sit to/from Stand Sit to Stand: Max assist Stand pivot transfers: Max assist, +2 physical assistance General transfer comment: maxAx1 to power up, R knee blocked, assist to achieve full upright posture and to push up from bed/chair    Ambulation / Gait / Stairs / Wheelchair Mobility  Ambulation/Gait Ambulation/Gait assistance: Max assist, +2 safety/equipment Ambulation Distance (Feet): 20 Feet(x1, 15x1) Assistive device: Rolling walker (2 wheeled) Gait Pattern/deviations: Step-to pattern, Decreased stride length, Decreased dorsiflexion - right, Decreased weight shift to right, Decreased weight shift to left, Antalgic General Gait Details: pt maxAx1, pt able to maintain upright posture better today, R UE con't to fatigue out requiring assist on R side to keep grip on walker. min/modA to advance R LE. pt with improved ability to advance R LE and clear foot  however inconsistent, most likely due to fatigue. Pt with improved R LE WBing tolerance Gait velocity: slow    Posture / Balance Dynamic Sitting Balance Sitting balance - Comments: pt able to tolerate sitting EOB with close supervision. pt remains to have slight R lateral lean but wasn't "falling over" Balance Overall balance assessment: Needs assistance Sitting-balance support: Feet supported Sitting balance-Leahy Scale: Fair Sitting balance - Comments: pt able to tolerate sitting EOB with close supervision. pt remains to have slight R lateral lean but wasn't "falling over" Postural control: Right lateral lean Standing balance-Leahy Scale: Poor Standing balance comment: max assist for standing balance    Special needs/care consideration BiPAP/CPAP: No CPM: No Continuous Drip IV: No Dialysis: No         Life Vest: No Oxygen: No Special Bed: No Trach Size: No Wound Vac (area): No       Skin: WDL                               Bowel mgmt: Continent, last BM 04/02/17 Bladder mgmt: Intermittent incontinence  Diabetic mgmt: Yes, managed with an insulin pen PTA     Previous Home Environment Living Arrangements: Spouse/significant other Available Help at Discharge: Family, Available 24 hours/day Type of Home: House Home Layout: One level Home Access: Stairs to enter Entrance Stairs-Rails: Can reach both Entrance Stairs-Number of Steps: 6 Bathroom Shower/Tub: Psychologist, counselling, Door [employer] Toilet: Standard Bathroom Accessibility: Yes Home Care Services: No  Discharge Living Setting Plans for Discharge Living Setting: Patient's home, Lives with (comment)(Spouse) Type of Home at Discharge: House Discharge Home Layout: One level Discharge Home Access: Stairs to enter Entrance Stairs-Rails:  Can reach both Entrance Stairs-Number of Steps: 6 Discharge Bathroom Shower/Tub: Tub/shower unit, Door Discharge Bathroom Toilet: Standard Discharge Bathroom Accessibility: No Does the  patient have any problems obtaining your medications?: No  Social/Family/Support Systems Patient Roles: Spouse, Parent, Other (Comment)(Grandparent ) Contact Information: SpouseEphriam Knuckles [phone]; Son: Lorna Few. [phone] Anticipated Caregiver: Spouse as he is able and discussed need for caregivers with patient and family  Anticipated Caregiver's Contact Information: see above Ability/Limitations of Caregiver: Spouse has cancer and can do limited assist  Caregiver Availability: 24/7 Discharge Harrell Discussed with Primary Caregiver: Yes Is Caregiver In Agreement with Harrell?: Yes Does Caregiver/Family have Issues with Lodging/Transportation while Pt is in Rehab?: No   Goals/Additional Needs Patient/Family Goal for Rehab: PT/OT: Min-Mod A; SLP: Mod I  Expected length of stay: 20-14 days  Cultural Considerations: None Dietary Needs: Carb. Mod. diet restrictions  Equipment Needs: TBD Special Service Needs: None Additional Information: Family will need home care resources  Pt/Family Agrees to Admission and willing to participate: Yes Program Orientation Provided & Reviewed with Pt/Caregiver Including Roles  & Responsibilities: Yes Additional Information Needs: Family will need home care resources  Information Needs to be Provided By: CSW  Barriers to Discharge: Lack of/limited family support   Decrease burden of Care through IP rehab admission: No  Possible need for SNF placement upon discharge: No  Patient Condition: This patient's condition remains as documented in the consult dated 04/02/17, in which the Rehabilitation Physician determined and documented that the patient's condition is appropriate for intensive rehabilitative care in an inpatient rehabilitation facility. Will admit to inpatient rehab today.  Preadmission Screen Completed By:  Fae Pippin, 04/03/2017 12:46 PM ______________________________________________________________________   Discussed status with  Dr. Riley Kill on 04/03/17 at 1258 and received telephone approval for admission today.  Admission Coordinator:  Fae Pippin, time 1258/Date 04/03/17             Cosigned by: Ranelle Oyster, Casey Harrell at 04/03/2017 1:20 PM  Revision History

## 2017-04-04 NOTE — Progress Notes (Signed)
CBG over 500. Jesusita Okaan, PA notified. Stat lab draw and give coverage of 9 units

## 2017-04-05 ENCOUNTER — Inpatient Hospital Stay (HOSPITAL_COMMUNITY): Payer: Medicare Other | Admitting: Speech Pathology

## 2017-04-05 ENCOUNTER — Other Ambulatory Visit: Payer: Self-pay | Admitting: Family Medicine

## 2017-04-05 ENCOUNTER — Inpatient Hospital Stay (HOSPITAL_COMMUNITY): Payer: Medicare Other | Admitting: Physical Therapy

## 2017-04-05 ENCOUNTER — Inpatient Hospital Stay (HOSPITAL_COMMUNITY): Payer: Medicare Other | Admitting: Occupational Therapy

## 2017-04-05 LAB — URINALYSIS, ROUTINE W REFLEX MICROSCOPIC
Bilirubin Urine: NEGATIVE
Glucose, UA: 50 mg/dL — AB
Hgb urine dipstick: NEGATIVE
Ketones, ur: NEGATIVE mg/dL
Nitrite: NEGATIVE
Protein, ur: NEGATIVE mg/dL
Specific Gravity, Urine: 1.012 (ref 1.005–1.030)
pH: 6 (ref 5.0–8.0)

## 2017-04-05 LAB — GLUCOSE, CAPILLARY
Glucose-Capillary: 197 mg/dL — ABNORMAL HIGH (ref 65–99)
Glucose-Capillary: 257 mg/dL — ABNORMAL HIGH (ref 65–99)
Glucose-Capillary: 342 mg/dL — ABNORMAL HIGH (ref 65–99)
Glucose-Capillary: 244 mg/dL — ABNORMAL HIGH (ref 65–99)

## 2017-04-05 MED ORDER — INSULIN DETEMIR 100 UNIT/ML ~~LOC~~ SOLN
37.0000 [IU] | Freq: Two times a day (BID) | SUBCUTANEOUS | Status: DC
  Administered 2017-04-05 – 2017-04-08 (×6): 37 [IU] via SUBCUTANEOUS
  Filled 2017-04-05 (×6): qty 0.37

## 2017-04-05 NOTE — Patient Care Conference (Signed)
Inpatient RehabilitationTeam Conference and Plan of Care Update Date: 04/04/2017   Time: 10:56 AM    Patient Name: Casey Harrell      Medical Record Number: [id number]  Date of Birth: [birth year] Sex: Female         Room/Bed: 4W20C/4W20C-01 Payor Info: Payor: Advertising copywriter MEDICARE / Plan: UHC MEDICARE / Product Type: *No Product type* /    Admitting Diagnosis: Hyperglycdemic  Admit Date/Time:  04/03/2017  5:06 PM Admission Comments: No comment available   Primary Diagnosis:  <principal problem not specified> Principal Problem: <principal problem not specified>  Patient Active Problem List   Diagnosis Date Noted  . Left pontine cerebrovascular accident (HCC) 04/03/2017  . History of CVA with residual deficit   . Type 2 diabetes mellitus with peripheral neuropathy (HCC)   . Anemia 03/30/2017  . History of stroke   . Chest pain 02/09/2016  . BPPV (benign paroxysmal positional vertigo) 08/25/2015  . HLD (hyperlipidemia) 08/25/2015  . At high risk for falls 07/01/2015  . Vertigo 06/21/2015  . Paroxysmal atrial fibrillation (HCC) 06/11/2015  . Cerebral infarction due to stenosis of left middle cerebral artery (HCC) 05/20/2015  . Intracranial vascular stenosis 05/20/2015  . Type 2 diabetes mellitus with circulatory disorder (HCC) 05/20/2015  . Type 2 diabetes, uncontrolled, with neuropathy (HCC)   . Abdominal pain   . Partial small bowel obstruction (HCC)   . Benign essential HTN   . Small bowel obstruction (HCC) 05/07/2015  . Hemiplegia, unspecified affecting right dominant side (HCC) 04/08/2015  . Embolic stroke involving posterior cerebral artery (HCC) 04/05/2015  . Orthostatic hypotension   . Hypertensive urgency 04/01/2015  . Fever in adult 04/01/2015  . Acute encephalopathy 04/01/2015  . Confusion   . CVA (cerebral infarction)   . Posterior circulation stroke (HCC)   . Acute ischemic stroke (HCC) 03/31/2015  . Stroke (HCC) 03/31/2015  . CKD (chronic kidney disease)  stage 3, GFR 30-59 ml/min (HCC) 04/17/2014  . Gastroenteritis, non-infectious 04/11/2014  . Nausea vomiting and diarrhea 04/10/2014  . Hypokalemia 04/10/2014  . Dehydration 04/09/2014  . Bronchitis with airway obstruction (HCC) 02/03/2013  . Leukocytosis 02/03/2013  . Fever 02/03/2013  . Acute respiratory failure with hypoxia (HCC) 02/03/2013  . Depression 02/03/2013  . Hyponatremia 02/03/2013  . Obesity (BMI 30-39.9) 01/10/2013  . PVD (peripheral vascular disease)- S/P RCE 06/18/09 12/13/2012  . Sleep apnea- C-pap intol 12/13/2012  . Low risk cardiac nuclear stress test- 2009 and Feb 2011 12/13/2012  . Mild persistent asthma 11/29/2011  . Chronic cough 06/05/2011  . Diabetic neuropathy (HCC) 09/02/2009  . Dyslipidemia 07/13/2009  . Hypothyroidism 11/12/2008  . Type 2 diabetes mellitus, uncontrolled (HCC) 07/15/2008  . Essential hypertension 07/15/2008  . GERD 07/15/2008  . DIVERTICULOSIS, COLON 07/15/2008    Expected Discharge Date: Expected Discharge Date: (2+ weeks)  Team Members Present: Physician leading conference: Dr. Claudette Laws Social Worker Present: Staci Acosta, LCSW Nurse Present: Chrissie Noa, RN PT Present: Grier Rocher, PT;Rosita Dechalus, PTA OT Present: Johnsie Cancel, OT SLP Present: Reuel Derby, SLP PPS Coordinator present : Tora Duck, RN, CRRN     Current Status/Progress Goal Weekly Team Focus  Medical   History of prior infarct, now with right hemiparesis, significant cognitive deficits  Reduce fall risk, reduce DVT risk, reduce aspiration risk  Initiate rehabilitation program   Bowel/Bladder   Continent of bowel and incontinent of bladder.  Keep patient clean and dry.  Assess patient for toileting needs q 2 hours and PRN.  Swallow/Nutrition/ Hydration   eval pending         ADL's   Mod A functional transfers; max A toileting; max A LB dressing; min A UB bathing/dressing  Supervision overall, min A shower transfers  ADL re-training, neuro  re-ed, functional activity tolerance, functional transfers   Mobility   Mod Assist overall for all functional mobilty. Poor motor planning and awareness and greatest limiting factors   Supervision assist overall for gait, transfers with LRAD   Strengthening, balance, safety awareness, gait.    Communication   eval pending         Safety/Cognition/ Behavioral Observations  eval pending         Pain   Right foot pain from 0 to 7 Voltaren effective.  Keep pain level below a 3.  Assess pain level q shift and PRN   Skin   Skin intact.  Maintain skin integrity.  Assess skin q shift and PRN.    Rehab Goals Patient on target to meet rehab goals: Yes Rehab Goals Revised: none - pt just being evaluated on day of conference *See Care Plan and progress notes for long and short-term goals.     Barriers to Discharge  Current Status/Progress Possible Resolutions Date Resolved   Physician    Decreased caregiver support;Other (comments)  Husband has dementia  Initiating program today  Continue rehab program      Nursing                  PT  Inaccessible home environment;Decreased caregiver support;Home environment access/layout                 OT Decreased caregiver support  Pt's daughter is working to piece together 24hr care; pt's husband has dementia and stage 4 cancer             SLP Decreased caregiver support Husband has recent dx of CA            SW                Discharge Planning/Teaching Needs:  Per OT and Admissions Coordinator, family is trying to arrange 24 hour care to assist pt at home.  Family education to occur closer to d/c with identified caregivers.   Team Discussion:  Pt was here 2 years ago, now with right hemiparesis from pontine CVA.  Pt has some confusion and ST questioned if pt has baseline dementia.  Dr. Wynn Banker feels it may be a vascular dementia.  Pt with elevated blood sugars and foul smelling urine.  Pt is pleasant and follows commands, per RN.  Pt is at  mod A now with right inattention, worse with UE than LE.  Pt has pain in R foot, but can tolerate bearing weight through it and is using aircast.  Pt walking 15' mod A with RW.  Pt has 6 stairs to enter her home.  Pt's husband with CA and dementia and dtr is working to arrange 24/7 care ofr pt.  She has supervision to min A goals.  Revisions to Treatment Plan:  none    Continued Need for Acute Rehabilitation Level of Care: The patient requires daily medical management by a physician with specialized training in physical medicine and rehabilitation for the following conditions: Daily direction of a multidisciplinary physical rehabilitation program to ensure safe treatment while eliciting the highest outcome that is of practical value to the patient.: Yes Daily medical management of patient stability for increased activity during participation  in an intensive rehabilitation regime.: Yes Daily analysis of laboratory values and/or radiology reports with any subsequent need for medication adjustment of medical intervention for : Neurological problems;Other  Kassie Keng, [place] 04/06/2017, 9:52 AM

## 2017-04-05 NOTE — Progress Notes (Signed)
Speech Language Pathology Daily Session Note  Patient Details  Name: Casey Harrell MRN: [id number] Date of Birth: [birth year]  Today's Date: 04/05/2017 SLP Individual Time: 1020-1115 SLP Individual Time Calculation (min): 55 min  Short Term Goals: Week 1: SLP Short Term Goal 1 (Week 1): Pt will utilize external memory aids to recall information with Min A cues.  SLP Short Term Goal 2 (Week 1): Pt will complete mildly complex reasoning problems with Min A cues.  SLP Short Term Goal 3 (Week 1): Pt will demonstrate intellectual awareness by identifying 2 physical and 2 cognitive deficits with Mod A cues.  SLP Short Term Goal 4 (Week 1): Pt will utilize increased vocal intensity and over articulation at the simple conversation level with Min A cues to achieve ~90% intelligibility.   Skilled Therapeutic Interventions:  Pt was seen for skilled ST targeting cognitive goals.  Pt was sitting up in wheelchair upon therapist's arrival and requested to use the bathroom.  Pt needed min question cues to apply brakes to wheelchair prior to standing up.  Pt needed min assist verbal cues to recognize and correct errors when sequencing 4 step picture cards.  Pt also needed min-mod verbal cues for recall of ingredients and steps of a familiar recipe.  Pt was returned to room and left in wheelchair with family at bedside.  Continue per current plan of care.    Function:  Eating     Cognition Comprehension Comprehension assist level: Follows basic conversation/direction with extra time/assistive device  Expression   Expression assist level: Expresses basic 90% of the time/requires cueing < 10% of the time.  Social Interaction Social Interaction assist level: Interacts appropriately with others with medication or extra time (anti-anxiety, antidepressant).  Problem Solving Problem solving assist level: Solves basic 75 - 89% of the time/requires cueing 10 - 24% of the time  Memory Memory assist level: Recognizes  or recalls 50 - 74% of the time/requires cueing 25 - 49% of the time    Pain Pain Assessment Pain Assessment: No/denies pain  Therapy/Group: Individual Therapy  Lavell Ridings, Melanee SpryNicole L 04/05/2017, 11:18 AM

## 2017-04-05 NOTE — Progress Notes (Signed)
Physical Therapy Session Note  Patient Details  Name: Casey Harrell MRN: [id number] Date of Birth: 09-19-1934  Today's Date: 04/05/2017 PT Individual Time: 0704-0800 PT Individual Time Calculation (min): 56 min   Short Term Goals: Week 1:  PT Short Term Goal 1 (Week 1): Pt will perform bed mobility with min assist  PT Short Term Goal 2 (Week 1): Pt will perform sit<>stand with min assist consistently  PT Short Term Goal 3 (Week 1): Pt will performed stand pivot WC with min assist and LRAD  PT Short Term Goal 4 (Week 1): Pt will ambulate 35f with min assist  PT Short Term Goal 5 (Week 1): Pt will propell WC 1038fwith min assist.   Skilled Therapeutic Interventions/Progress Updates:      Pt received supine in bed and agreeable to PT. Supine>sit transfer with min assist, heavy use of bed rails and min cues. Pt reports need for urination. [facility]  And moderate cues for sequencing. [facility] with min assist and heavy use of rail. Pt performed sit<>stand min assist with rail to perform perineal hygiene. PT also instructed pt in donning of lower body clothing with min assist for balance and 1-0 UE support at rail. Hand hygiene at sink with standing with min assist from PT to stabilize the RLE.   Pt transported to rehab gym. Sit<>stand with mod assist and RW x 3. Pt noted to have increased pallor with transfer in rehab gym. Orthostatic vitals assessed. Sitting. 162/62 . Standing 124/67. Asymptomatic per pt. Gait training instructed by PT x 25 ft with mod assist for stability through RLE to prevent knee collapse as well as improve weight shift to allow improved swing on the RLE. BP assessed following gait. 135/68  Pt transfer to day room.  Nustep reciprocal movement training x 7 minutes level 4>3. Min cues for symmetrical movement and improved use of the RUE and RLE.    Patient returned to room and [facility]  [facility] reach and all needs met.      Therapy Documentation Precautions:  Precautions Precautions: Fall Precaution Comments: R ankle bruising Restrictions Weight Bearing Restrictions: No Vital Signs: Therapy Vitals Temp: 97.7 F (36.5 C) Temp Source: Oral Pulse Rate: 75 Resp: 18 BP: (!) 155/55 Patient Position (if appropriate): Lying Oxygen Therapy SpO2: 94 % O2 Device: Not Delivered   See Function Navigator for Current Functional Status.   Therapy/Group: Individual Therapy  AuLorie Phenix1/29/2018, 8:00 AM

## 2017-04-05 NOTE — Progress Notes (Signed)
Subjective/Complaints: Per pt ankle feels better Good appetite Urinary inc during the night and int inc during the day  ROS- no CP SOB, abd pain, neg N/V Objective: Vital Signs: Blood pressure (!) 155/55, pulse 75, temperature 97.7 F (36.5 C), temperature source Oral, resp. rate 18, height 5' 1" (1.549 m), weight 63.4 kg (139 lb 12.4 oz), SpO2 94 %. No results found. Results for orders placed or performed during the hospital encounter of 04/03/17 (from the past 72 hour(s))  Glucose, capillary     Status: Abnormal   Collection Time: 04/03/17  9:24 PM  Result Value Ref Range   Glucose-Capillary 425 (H) 65 - 99 mg/dL  CBC WITH DIFFERENTIAL     Status: Abnormal   Collection Time: 04/04/17  6:35 AM  Result Value Ref Range   WBC 9.5 4.0 - 10.5 K/uL   RBC 4.44 3.87 - 5.11 MIL/uL   Hemoglobin 11.9 (L) 12.0 - 15.0 g/dL   HCT 37.1 36.0 - 46.0 %   MCV 83.6 78.0 - 100.0 fL   MCH 26.8 26.0 - 34.0 pg   MCHC 32.1 30.0 - 36.0 g/dL   RDW 15.3 11.5 - 15.5 %   Platelets 306 150 - 400 K/uL   Neutrophils Relative % 55 %   Neutro Abs 5.3 1.7 - 7.7 K/uL   Lymphocytes Relative 32 %   Lymphs Abs 3.0 0.7 - 4.0 K/uL   Monocytes Relative 8 %   Monocytes Absolute 0.8 0.1 - 1.0 K/uL   Eosinophils Relative 5 %   Eosinophils Absolute 0.4 0.0 - 0.7 K/uL   Basophils Relative 0 %   Basophils Absolute 0.0 0.0 - 0.1 K/uL  Comprehensive metabolic panel     Status: Abnormal   Collection Time: 04/04/17  6:35 AM  Result Value Ref Range   Sodium 136 135 - 145 mmol/L   Potassium 4.2 3.5 - 5.1 mmol/L   Chloride 101 101 - 111 mmol/L   CO2 27 22 - 32 mmol/L   Glucose, Bld 314 (H) 65 - 99 mg/dL   BUN 22 (H) 6 - 20 mg/dL   Creatinine, Ser 0.92 0.44 - 1.00 mg/dL   Calcium 9.4 8.9 - 10.3 mg/dL   Total Protein 6.9 6.5 - 8.1 g/dL   Albumin 3.4 (L) 3.5 - 5.0 g/dL   AST 15 15 - 41 U/L   ALT 14 14 - 54 U/L   Alkaline Phosphatase 90 38 - 126 U/L   Total Bilirubin 0.3 0.3 - 1.2 mg/dL   GFR calc non Af Amer 58 (L)  >60 mL/min   GFR calc Af Amer >60 >60 mL/min    Comment: (NOTE) The eGFR has been calculated using the CKD EPI equation. This calculation has not been validated in all clinical situations. eGFR's persistently <60 mL/min signify possible Chronic Kidney Disease.    Anion gap 8 5 - 15  Glucose, capillary     Status: Abnormal   Collection Time: 04/04/17  6:47 AM  Result Value Ref Range   Glucose-Capillary 318 (H) 65 - 99 mg/dL  Urinalysis, Routine w reflex microscopic     Status: Abnormal   Collection Time: 04/04/17 11:04 AM  Result Value Ref Range   Color, Urine YELLOW YELLOW   APPearance HAZY (A) CLEAR   Specific Gravity, Urine 1.012 1.005 - 1.030   pH 6.0 5.0 - 8.0   Glucose, UA 50 (A) NEGATIVE mg/dL   Hgb urine dipstick NEGATIVE NEGATIVE   Bilirubin Urine NEGATIVE NEGATIVE   Ketones,  ur NEGATIVE NEGATIVE mg/dL   Protein, ur NEGATIVE NEGATIVE mg/dL   Nitrite NEGATIVE NEGATIVE   Leukocytes, UA SMALL (A) NEGATIVE   RBC / HPF 0-5 0 - 5 RBC/hpf   WBC, UA 6-30 0 - 5 WBC/hpf   Bacteria, UA FEW (A) NONE SEEN   Squamous Epithelial / LPF 0-5 (A) NONE SEEN   Hyaline Casts, UA PRESENT    Ca Oxalate Crys, UA PRESENT   Glucose, capillary     Status: Abnormal   Collection Time: 04/04/17 12:10 PM  Result Value Ref Range   Glucose-Capillary 527 (HH) 65 - 99 mg/dL  Glucose, random     Status: Abnormal   Collection Time: 04/04/17 12:32 PM  Result Value Ref Range   Glucose, Bld 494 (H) 65 - 99 mg/dL  Glucose, capillary     Status: Abnormal   Collection Time: 04/04/17  2:07 PM  Result Value Ref Range   Glucose-Capillary 415 (H) 65 - 99 mg/dL  Glucose, capillary     Status: Abnormal   Collection Time: 04/04/17  5:06 PM  Result Value Ref Range   Glucose-Capillary 339 (H) 65 - 99 mg/dL  Glucose, capillary     Status: Abnormal   Collection Time: 04/04/17  9:33 PM  Result Value Ref Range   Glucose-Capillary 316 (H) 65 - 99 mg/dL  Glucose, capillary     Status: Abnormal   Collection Time:  04/05/17  6:34 AM  Result Value Ref Range   Glucose-Capillary 197 (H) 65 - 99 mg/dL   Comment 1 Notify RN      HEENT: normal Cardio: irregular and no murmur Resp: CTA B/L and unlabored GI: BS positive and NT, ND Extremity:  Pulses positive and No Edema Skin:   Intact Neuro: Alert/Oriented, Abnormal Sensory oriented to day  but not month or place even with cuing, Abnormal Motor 3- R delt, 4- R triceps, 3- R biceps an grip, Abnormal FMC Ataxic/ dec FMC and Other RLE 3- HF, 4- KE, 3- ADF with pain limitation Musc/Skel:  Other pain over R achilles to palp , no calf swelling or tenderness, pain to palp ~ 15cm above bimalleolar line Gen NAD Reduced sensation in R L5 and S1 dermatome  Assessment/Plan: 1. Functional deficits secondary to Left pontine infarct which require 3+ hours per day of interdisciplinary therapy in a comprehensive inpatient rehab setting. Physiatrist is providing close team supervision and 24 hour management of active medical problems listed below. Physiatrist and rehab team continue to assess barriers to discharge/monitor patient progress toward functional and medical goals. FIM: Function - Bathing Body parts bathed by patient: Right arm, Left arm, Chest, Abdomen, Right upper leg, Left upper leg Body parts bathed by helper: Front perineal area, Buttocks, Back, Right lower leg, Left lower leg  Function- Upper Body Dressing/Undressing What is the patient wearing?: Pull over shirt/dress Pull over shirt/dress - Perfomed by patient: Thread/unthread right sleeve, Thread/unthread left sleeve, Put head through opening, Pull shirt over trunk Assist Level: Touching or steadying assistance(Pt > 75%) Function - Lower Body Dressing/Undressing What is the patient wearing?: Pants, Non-skid slipper socks Position: Wheelchair/chair at sink Pants- Performed by patient: Thread/unthread left pants leg, Pull pants up/down Pants- Performed by helper: Thread/unthread right pants  leg Non-skid slipper socks- Performed by patient: Don/doff left sock Non-skid slipper socks- Performed by helper: Don/doff right sock Assist for footwear: Partial/moderate assist Assist for lower body dressing: Touching or steadying assistance (Pt > 75%)  Function - Toileting Toileting steps completed by patient: Adjust  clothing prior to toileting, Performs perineal hygiene, Adjust clothing after toileting Toileting steps completed by helper: Adjust clothing prior to toileting, Adjust clothing after toileting  Function - Toilet Transfers Toilet transfer assistive device: Grab bar Assist level to toilet: Maximal assist (Pt 25 - 49%/lift and lower) Assist level from toilet: Maximal assist (Pt 25 - 49%/lift and lower)  Function - Chair/bed transfer Chair/bed transfer method: Stand pivot Chair/bed transfer assist level: Moderate assist (Pt 50 - 74%/lift or lower) Chair/bed transfer assistive device: Armrests, Walker  Function - Locomotion: Wheelchair Max wheelchair distance: 50 Assist Level: Moderate assistance (Pt 50 - 74%) Assist Level: Moderate assistance (Pt 50 - 74%) Wheel 150 feet activity did not occur: Safety/medical concerns Turns around,maneuvers to table,bed, and toilet,negotiates 3% grade,maneuvers on rugs and over doorsills: No Function - Locomotion: Ambulation Assistive device: Walker-rolling Max distance: 69f Assist level: Moderate assist (Pt 50 - 74%) Assist level: Moderate assist (Pt 50 - 74%) Walk 50 feet with 2 turns activity did not occur: Safety/medical concerns Walk 150 feet activity did not occur: Safety/medical concerns Walk 10 feet on uneven surfaces activity did not occur: Safety/medical concerns  Function - Comprehension Comprehension: Auditory Comprehension assist level: Understands basic 90% of the time/cues < 10% of the time, Follows basic conversation/direction with extra time/assistive device  Function - Expression Expression: Verbal Expression  assist level: Expresses complex 90% of the time/cues < 10% of the time  Function - Social Interaction Social Interaction assist level: Interacts appropriately 90% of the time - Needs monitoring or encouragement for participation or interaction.  Function - Problem Solving Problem solving assist level: Solves basic 90% of the time/requires cueing < 10% of the time  Function - Memory Memory assist level: Recognizes or recalls 75 - 89% of the time/requires cueing 10 - 24% of the time Patient normally able to recall (first 3 days only): That he or she is in a hospital, Current season  Medical Problem List and Plan: 1.Increased Right-sided weaknesssecondary to acute infarction left pons as well as history of CVA posterior cerebral artery December 2016 with right-sided residual weakness -CIR PT, OT, SLP evals 2. DVT Prophylaxis/Anticoagulation: Eliquis 3. Pain Management:Lyrica 150 mg daily, Cymbalta 30 mg twice a day,? Diabetic neuropathy- no symmetric numbness  Voltaren gel -right high  ankle sprain-----ice, PRAFO boot for support,Air cast able to bear wt per PT 4. Mood:Provide emotional support 5. Neuropsych: This patientisnot capable of making decisions on herown behalf. Poor orientation will ask neuropsych to eval 6. Skin/Wound Care:Routine skin checks 7. Fluids/Electrolytes/Nutrition:Routine I&O's with follow-up chemistries 8.Diabetes mellitus and peripheral neuropathy. Hemoglobin A1c 8.6. Levemir 30 units twice a day. Check blood sugars before meals and at bedtime CBG (last 3)  Recent Labs    04/04/17 1706 04/04/17 2133 04/05/17 0634  GLUCAP 339* 316* 197*  Poor control PCP rec 40U BID, will increase to 37U 9.Hypothyroidism. Synthroid 10.Hypertension. Avapro 150 mg daily, Norvasc 5 mg daily, Coreg 6.25 mg twice a day. Vitals:   04/04/17 1700 04/05/17 0500  BP: (!) 138/56 (!) 155/55  Pulse: 70 75  Resp: 19 18  Temp: 97.8 F (36.6 C) 97.7 F  (36.5 C)  SpO2: 95% 94%  Control in desired range at present 11/29 11.Hyperlipidemia. Lipitor 12.GERD. Protonix    LOS (Days) 2 A FACE TO FACE EVALUATION WAS PERFORMED  ACharlett Blake11/29/2018, 8:39 AM

## 2017-04-05 NOTE — Progress Notes (Signed)
[facility] Individual Statement of Services  Patient Name:  Casey MedianKay F Harrell  Date:  04/05/2017  Welcome to the [facility].  Our goal is to provide you with an individualized program based on your diagnosis and situation, designed to meet your specific needs.  With this comprehensive rehabilitation program, you will be expected to participate in at least 3 hours of rehabilitation therapies Monday-Friday, with modified therapy programming on the weekends.  Your rehabilitation program will include the following services:  Physical Therapy (PT), Occupational Therapy (OT), Speech Therapy (ST), 24 hour per day rehabilitation nursing, Case Management (Social Worker), Rehabilitation Medicine, Nutrition Services and Pharmacy Services  Weekly team conferences will be held on Wednesdays to discuss your progress.  Your Social Worker will talk with you frequently to get your input and to update you on team discussions.  Team conferences with you and your family in attendance may also be held.  Expected length of stay:  2 to 2 1/2 weeks  Overall anticipated outcome:  Supervision with minimal assistance for shower transfers and stairs  Depending on your progress and recovery, your program may change. Your Social Worker will coordinate services and will keep you informed of any changes. Your Social Worker's name and contact numbers are listed  below.  The following services may also be recommended but are not provided by the [facility]:   Driving Evaluations  Home Health Rehabiltiation Services  Outpatient Rehabilitation Services   Arrangements will be made to provide these services after discharge if needed.  Arrangements include referral to agencies that provide these services.  Your insurance has been verified to be:  [employer] Your primary doctor is:  Dr. Evelena PeatBruce Burchette  Pertinent information will be shared with your doctor  and your insurance company.  Social Worker:  Staci AcostaJenny Kastiel Simonian, LCSW  [phone] or [phone]) [phone]  Information discussed with and copy given to patient by: Elvera LennoxPrevatt, Aliviya Schoeller Capps, 04/05/2017, 3:12 PM

## 2017-04-05 NOTE — Progress Notes (Signed)
Occupational Therapy Session Note  Patient Details  Name: Casey Harrell MRN: [id number] Date of Birth: 1934-06-03  Today's Date: 04/05/2017 OT Individual Time: 1315-1400 OT Individual Time Calculation (min): 45 min  and Today's Date: 04/05/2017 OT Missed Time: 15 Minutes Missed Time Reason: Other (comment)(Finishing lunch)   Short Term Goals: Week 1:  OT Short Term Goal 1 (Week 1): Pt will complete 3/3 toileting task with CGA OT Short Term Goal 2 (Week 1): Pt will dress LB with steadying assist OT Short Term Goal 3 (Week 1): Pt will use R UE at dominatnt level during grooming task with min cuing  OT Short Term Goal 4 (Week 1): Pt will utilize hemi dressing technique with min cuing OT Short Term Goal 5 (Week 1): Pt will be oriented to place and situation with min questioning cues  Skilled Therapeutic Interventions/Progress Updates:    Therapist arrived at 1300 for scheduled session. Pt eating lunch and requesting therapist to return when finished. Therapist returned at 1315 and pt agreeable to tx session.  Pt seen for OT ADL bathing/dressing session. She completed stand pivot transfers throughout session with min-mod A using grab bars. She completed very short distant ambulation toilet <> shower with HHA and use of grab bar, requiring B UE support during ambulation. She bathed at shower level seated on tub transfer bench. Pt demonstrates poor dynamic sitting balance/ control though was able to safely complete bathing task from seated position with supervision. She stood with steadying assist to complete buttock hygiene. She dressed seated on toilet, independently oriented clothing items. She brushed hair from seated position, independently initiated use of R UE to complete task, though poor coordination and control of movements.  She requested return to supine at end of session. Left with ice on R ankle per request, all needs in reach and bed alarm on. Pt's husband present throughout session.    Therapy Documentation Precautions:  Precautions Precautions: Fall Precaution Comments: R ankle bruising Restrictions Weight Bearing Restrictions: No Pain: Pain Assessment Pain Score: No/ denies pain  See Function Navigator for Current Functional Status.   Therapy/Group: Individual Therapy  Lewis, Coree Riester C 04/05/2017, 7:12 AM

## 2017-04-05 NOTE — Progress Notes (Signed)
Social Work Patient ID: Link Snuffer, female   DOB: 15-Oct-1934, 82 y.o.   MRN: [id number]   CSW met (who slept through visit) with pt and her husband to update them on team conference discussion and that her LOS is expected to be about 2 - 2 1/2 weeks and that she will need 24/7 supervision with some minimal assistance.  Pt's husband is dealing with some health issues, as well, so family is trying to piece together care for pt at home.  CSW also called pt's son, Leota Sauers., with permission from husband to update him.  He confirmed that he and his sister are working out how family can be with pt at home after d/c.  He was appreciative of CSW's phone call.  CSW explained that [place] who worked with pt 2 years ago will assume her care upon her return on 04-09-17.  This CSW remains available to assist as needed until her return.

## 2017-04-05 NOTE — Progress Notes (Signed)
Occupational Therapy Note  Patient Details  Name: Casey Harrell MRN: [id number] Date of Birth: [birth year]  Today's Date: 04/05/2017 OT Individual Time: 1135-1200 OT Individual Time Calculation (min): 25 min   No c/o pain  Pt seen this session to work on standing balance and RUE AROM.  Pt received in w/c.  Positioned pt at sink for UE support.  She worked on sit to stand with mod A and then weight shifts over R foot in standing with tactile cues for knee extension. Pt tolerated about 5 min and then became fatigued.   Pt then sat in w/c working on upright posture for R shoulder PROM with excellent range, no tightness. Pt worked on various A/arom exercises to increase shoulder and elbow range.   B hands on 1# dowel bar with assist to push pull bar in and out .  Pt maintained R grasp well.  Pt resting in w/c with quick release belt on and spouse in room with pt.    Alonnah Lampkins 04/05/2017, 1:44 PM

## 2017-04-06 ENCOUNTER — Inpatient Hospital Stay (HOSPITAL_COMMUNITY): Payer: Medicare Other | Admitting: Physical Therapy

## 2017-04-06 ENCOUNTER — Inpatient Hospital Stay (HOSPITAL_COMMUNITY): Payer: Medicare Other | Admitting: Speech Pathology

## 2017-04-06 ENCOUNTER — Inpatient Hospital Stay (HOSPITAL_COMMUNITY): Payer: Medicare Other | Admitting: Occupational Therapy

## 2017-04-06 LAB — GLUCOSE, CAPILLARY
Glucose-Capillary: 180 mg/dL — ABNORMAL HIGH (ref 65–99)
Glucose-Capillary: 277 mg/dL — ABNORMAL HIGH (ref 65–99)
Glucose-Capillary: 369 mg/dL — ABNORMAL HIGH (ref 65–99)
Glucose-Capillary: 349 mg/dL — ABNORMAL HIGH (ref 65–99)

## 2017-04-06 NOTE — IPOC Note (Signed)
Overall Plan of Care [facility](IPOC) Patient Details Name: Casey Harrell MRN: [id number] DOB: [birth year]  Admitting Diagnosis: <principal problem not specified>  Hospital Problems: Active Problems:   Left pontine cerebrovascular accident [facility]HCC)     Functional Problem List: Nursing Endurance, Motor, Safety  PT Balance, Behavior, Endurance, Motor, Pain, Perception, Safety, Sensory  OT Balance, Motor, Sensory, Cognition, Pain, Perception, Endurance, Safety  SLP Cognition, Linguistic  TR         Basic ADL's: OT Eating, Grooming, Bathing, Dressing, Toileting     Advanced  ADL's: OT       Transfers: PT Bed Mobility, Bed to Chair, Car, State Street CorporationFurniture, Civil Service fast streamerloor  OT Toilet, Research scientist (life sciences)Tub/Shower     Locomotion: PT Psychologist, prison and probation servicesWheelchair Mobility, Ambulation, Stairs     Additional Impairments: OT Fuctional Use of Upper Extremity  SLP Communication, Social Cognition expression Problem Solving, Memory, Attention, Awareness  TR      Anticipated Outcomes Item Anticipated Outcome  Self Feeding Mod I  Swallowing      Basic self-care  Supervision  Toileting  Supervision   Bathroom Transfers Supervision-min A  Bowel/Bladder  Continent to bladder and bowel with mod. assist.  Transfers  Supervision assist with LRAD   Locomotion  Supervision assist with LRAD at household level   Communication  Supervision  Cognition  Supervision  Pain  Less than 3,on 1 to 10 scale.  Safety/Judgment  Free from falls during her stay in rehab.   Therapy Plan: PT Intensity: Minimum of 1-2 x/day ,45 to 90 minutes PT Frequency: 5 out of 7 days PT Duration Estimated Length of Stay: 14-18 OT Intensity: Minimum of 1-2 x/day, 45 to 90 minutes OT Frequency: 5 out of 7 days OT Duration/Estimated Length of Stay: 2-2.5 weeks SLP Intensity: Minumum of 1-2 x/day, 30 to 90 minutes SLP Frequency: 3 to 5 out of 7 days SLP Duration/Estimated Length of Stay: 2 to 2.5 weeks    Team Interventions: Nursing Interventions Patient/Family  Education, Disease Management/Prevention, Skin Care/Wound Management  PT interventions Ambulation/gait training, Cognitive remediation/compensation, Discharge planning, DME/adaptive equipment instruction, Functional mobility training, Pain management, Psychosocial support, Splinting/orthotics, Therapeutic Activities, UE/LE Strength taining/ROM, Visual/perceptual remediation/compensation, Wheelchair propulsion/positioning, UE/LE Coordination activities, Therapeutic Exercise, Skin care/wound management, Stair training, Patient/family education, Neuromuscular re-education, Functional electrical stimulation, Disease management/prevention, FirefighterCommunity reintegration, Warden/rangerBalance/vestibular training  OT Interventions Warden/rangerBalance/vestibular training, Cognitive remediation/compensation, Discharge planning, DME/adaptive equipment instruction, Functional electrical stimulation, Neuromuscular re-education, Patient/family education, Self Care/advanced ADL retraining, Psychosocial support, Functional mobility training, Pain management, Skin care/wound managment, Therapeutic Exercise, UE/LE Coordination activities, UE/LE Strength taining/ROM  SLP Interventions Functional tasks, Patient/family education, Cognitive remediation/compensation, Internal/external aids  TR Interventions    SW/CM Interventions Discharge Planning, Psychosocial Support, Patient/Family Education   Barriers to Discharge MD  Medical stability and Lack of/limited family support  Nursing      PT Inaccessible home environment, Decreased caregiver support, Home environment access/layout    OT Decreased caregiver support Pt's daughter is working to piece together 24hr care; pt's husband has dementia and stage 4 cancer  SLP Decreased caregiver support Husband has recent dx of CA  SW       Team Discharge Planning: Destination: PT-Home ,OT- Home , SLP-Home Projected Follow-up: PT-Home health PT, OT-  Home health OT, SLP-Home Health SLP, 24 hour  supervision/assistance Projected Equipment Needs: PT-Rolling walker with 5" wheels, Wheelchair (measurements), Wheelchair cushion (measurements), OT- To be determined, SLP-None recommended by SLP Equipment Details: PT- , OT-  Patient/family involved in discharge planning: PT- Patient,  OT-Patient, Family member/caregiver, SLP-Patient, Family member/caregiver  MD ELOS: 14-18d Medical Rehab Prognosis:  Good Assessment:  82 y.o.right handed femalewith history of hypertension, hyperlipidemia, diabetes mellitus with neuropathy, atrial fibrillation maintained on Eliquisand CVA embolic involving posterior cerebral artery with right-sided weaknessas well as some memory impairment inDecember 2016 and received inpatient rehabilitation services. Per chart reviewand patient,patient lives with spouse, who has cancer. Independent with assistive device using rolling walker. Did not need assist for ADLs. One level home with 6 steps to entry. Presented 03/30/2017 with increasing right sided weakness and difficulty with speech. Cranial CT scanreviewed, unremarkable for acute process. Per report, multifocal old infarcts. Patient did not receive TPA. Troponin negative. MRI showed small acute infarct of the left pons measuring approximately 8 x 3 mm. No associated hemorrhage. MRA motion degraded showed moderate to severe stenosis of the supraclinoid right ICA. No occlusion or high-grade stenosis of the intracranial arch vessels. Echocardiogram with ejection fraction of 60% no wall motion abnormalities. Neurology consulted with aspirin added in addition toEliquisthat patient was on prior to admission    Now requiring 24/7 Rehab RN,MD, as well as CIR level PT, OT and SLP.  Treatment team will focus on ADLs and mobility with goals set at Sup  See Team Conference Notes for weekly updates to the plan of care

## 2017-04-06 NOTE — Progress Notes (Signed)
Social Work Assessment and Plan  Patient Details  Name: Casey Harrell MRN: [id number] Date of Birth: [birth year]  Today's Date: 04/05/2017  Problem List:  Patient Active Problem List   Diagnosis Date Noted  . Left pontine cerebrovascular accident (HCC) 04/03/2017  . History of CVA with residual deficit   . Type 2 diabetes mellitus with peripheral neuropathy (HCC)   . Anemia 03/30/2017  . History of stroke   . Chest pain 02/09/2016  . BPPV (benign paroxysmal positional vertigo) 08/25/2015  . HLD (hyperlipidemia) 08/25/2015  . At high risk for falls 07/01/2015  . Vertigo 06/21/2015  . Paroxysmal atrial fibrillation (HCC) 06/11/2015  . Cerebral infarction due to stenosis of left middle cerebral artery (HCC) 05/20/2015  . Intracranial vascular stenosis 05/20/2015  . Type 2 diabetes mellitus with circulatory disorder (HCC) 05/20/2015  . Type 2 diabetes, uncontrolled, with neuropathy (HCC)   . Abdominal pain   . Partial small bowel obstruction (HCC)   . Benign essential HTN   . Small bowel obstruction (HCC) 05/07/2015  . Hemiplegia, unspecified affecting right dominant side (HCC) 04/08/2015  . Embolic stroke involving posterior cerebral artery (HCC) 04/05/2015  . Orthostatic hypotension   . Hypertensive urgency 04/01/2015  . Fever in adult 04/01/2015  . Acute encephalopathy 04/01/2015  . Confusion   . CVA (cerebral infarction)   . Posterior circulation stroke (HCC)   . Acute ischemic stroke (HCC) 03/31/2015  . Stroke (HCC) 03/31/2015  . CKD (chronic kidney disease) stage 3, GFR 30-59 ml/min (HCC) 04/17/2014  . Gastroenteritis, non-infectious 04/11/2014  . Nausea vomiting and diarrhea 04/10/2014  . Hypokalemia 04/10/2014  . Dehydration 04/09/2014  . Bronchitis with airway obstruction (HCC) 02/03/2013  . Leukocytosis 02/03/2013  . Fever 02/03/2013  . Acute respiratory failure with hypoxia (HCC) 02/03/2013  . Depression 02/03/2013  . Hyponatremia 02/03/2013  . Obesity (BMI  30-39.9) 01/10/2013  . PVD (peripheral vascular disease)- S/P RCE 06/18/09 12/13/2012  . Sleep apnea- C-pap intol 12/13/2012  . Low risk cardiac nuclear stress test- 2009 and Feb 2011 12/13/2012  . Mild persistent asthma 11/29/2011  . Chronic cough 06/05/2011  . Diabetic neuropathy (HCC) 09/02/2009  . Dyslipidemia 07/13/2009  . Hypothyroidism 11/12/2008  . Type 2 diabetes mellitus, uncontrolled (HCC) 07/15/2008  . Essential hypertension 07/15/2008  . GERD 07/15/2008  . DIVERTICULOSIS, COLON 07/15/2008   Past Medical History:  Past Medical History:  Diagnosis Date  . ASTHMA 07/15/2008  . COPD (chronic obstructive pulmonary disease) (HCC)   . DIABETES-TYPE 2 07/15/2008  . DIABETIC PERIPHERAL NEUROPATHY 09/02/2009  . GERD 07/15/2008  . HYPERTENSION 07/15/2008  . Normal cardiac stress test    low risk Nuc 2009, Feb 2011  . Paroxysmal atrial fibrillation (HCC)   . PVD (peripheral vascular disease) (HCC) 06/18/09   RCE  . Stroke (HCC)   . Vertigo    Past Surgical History:  Past Surgical History:  Procedure Laterality Date  . ABDOMINAL HYSTERECTOMY  1971  . APPENDECTOMY  1971  . CAROTID ENDARTERECTOMY  06/18/09   RCE  . CESAREAN SECTION    . colectomy and colostomy    . FLEXIBLE SIGMOIDOSCOPY     diverticulitis  . FOOT SURGERY     right foot X 2  . LAPAROTOMY     X 3 with adhesions lysis  . TONSILLECTOMY    . TUBAL LIGATION     Social History:  reports that  has never smoked. she has never used smokeless tobacco. She reports that she does not drink alcohol  or use drugs.  Family / Support Systems Marital Status: Married Patient Roles: Spouse, Parent, Other (Comment)(grandparent; great grandparent) Spouse/Significant Other: Calina Sutera - husband - [phone] Children: Winette Bobbitt - son - [phone]; dtr Other Supports: Meaghan Woolford - dtr - [phone]  Anticipated Caregiver: Spouse as he is able and discussed need for caregivers with patient and family   Ability/Limitations of Caregiver: Spouse has cancer and can do limited assist  Caregiver Availability: 24/7(son stated they are working out someone to be with pt) Family Dynamics: Supportive family who is going through a lot right now - new baby in the family; husband with CA treatments; children trying to work  Social History Preferred language: English Religion: Control and instrumentation engineer Education: high school Read: Yes Write: Yes Employment Status: Retired Marine scientist Issues: none reported Guardian/Conservator: MD has deteremined that pt is not capable of making her own decisions right now.  Husband and son to assist as next of kin.   Abuse/Neglect Abuse/Neglect Assessment Can Be Completed: Yes Physical Abuse: Denies Verbal Abuse: Denies Sexual Abuse: Denies Exploitation of patient/patient's resources: Denies Self-Neglect: Denies  Emotional Status Pt's affect, behavior and adjustment status: Pt was asleep during much of CSW's visit, but husband reports she is doing pretty well emotionally.  CSW will continue to assess this during her stay. Recent Psychosocial Issues: Pt just had a stroke 2 years ago and went through CIR program. Psychiatric History: none reported, but CSW will refer pt to neuropsychologist during her stay due to repeat stroke. Substance Abuse History: none reported  Patient / Family Perceptions, Expectations & Goals Pt/Family understanding of illness & functional limitations: Husband reports a good understanding of pt's condition.  He feels pt/family are dealing with pt's stroke as well as can be expected.  He acknoweledged he has his own health issues currently. Premorbid pt/family roles/activities: Pt enjoys spending time with family and at church. Anticipated changes in roles/activities/participation: Pt/husband hope pt can resume activites as she is able. Pt/family expectations/goals: Husband hopes that pt gets back to where she was before.  Will need to ask pt  her goal when she is more alert.  Community [employer] Agencies: None Premorbid Home Care/DME Agencies: Other (Comment)([facility] and DME - has w/c and 3-in-1; rolling walker; shower seat built in with grab bars and hand held shower head.) Transportation available at discharge: family Resource referrals recommended: Neuropsychology, Support group (specify)  Discharge Planning Living Arrangements: Spouse/significant other Support Systems: Spouse/significant other, Children, Other relatives, Friends/neighbors, Church/faith community Type of Residence: Private residence Insurance Resources: Media planner (specify)([employer] Medicare) Financial Resources: Social Security Financial Screen Referred: No Money Management: Patient, Spouse Does the patient have any problems obtaining your medications?: No Home Management: Pt's family will do this for pt. Patient/Family Preliminary Plans: Son is working on arranging family to be with pt 24/7, especially with pt's husband having health issues currently. Expected length of stay: 2 to 2 1/2 weeks  Clinical Impression CSW met with pt and her husband to introduce self and role of CSW, as well as to complete assessment.  They are familiar with CSW role and CIR due to recent admission two years ago.  CSW explained that pt's previous CSW will assume the case upon her return 04-09-17, since she had pt at that time.  Husband expressed understanding.  Pt was asleep for most of visit.  Husband was asleep, also, at first, but awoke easily to speak with CSW.  He gave permission for CSW  to also call Clif, Montez Hageman, son to update him.  He stated that family is working on arranging 24/7 care of pt to support husband since his health is not great currently, as well.  CSW acknowledged this being a difficult time for the family and son was appreciative.  He and husband both feel pt is doing okay emotionally, but CSW will continue to monitor this  and ask neuropsychologist to see pt as needed.  Becky DuPree to assume case 04-09-17 and follow pt through CIR stay.    Sarahy Creedon, [place] 04/06/2017, 3:10 PM

## 2017-04-06 NOTE — Plan of Care (Signed)
  Progressing RH BOWEL ELIMINATION RH STG MANAGE BOWEL WITH ASSISTANCE Description STG Manage Bowel with Assistance. 04/06/2017 1749 - Progressing by Melina ModenaBurchett, Khyrie Masi, RN RH STG MANAGE BOWEL W/MEDICATION W/ASSISTANCE Description STG Manage Bowel with Medication with Assistance. 04/06/2017 1749 - Progressing by Melina ModenaBurchett, Dontavius Keim, RN RH STG MANAGE BOWEL W/EQUIPMENT W/ASSISTANCE Description STG Manage Bowel With Equipment With Assistance 04/06/2017 1749 - Progressing by Melina ModenaBurchett, Dannette Kinkaid, RN RH BLADDER ELIMINATION RH STG MANAGE BLADDER WITH ASSISTANCE Description STG Manage Bladder With Assistance 04/06/2017 1749 - Progressing by Melina ModenaBurchett, Zayne Marovich, RN RH SKIN INTEGRITY RH STG SKIN FREE OF INFECTION/BREAKDOWN 04/06/2017 1749 - Progressing by Melina ModenaBurchett, Adriana Lina, RN RH STG MAINTAIN SKIN INTEGRITY WITH ASSISTANCE Description STG Maintain Skin Integrity With Assistance. 04/06/2017 1749 - Progressing by Melina ModenaBurchett, Shoni Quijas, RN RH SAFETY RH STG ADHERE TO SAFETY PRECAUTIONS W/ASSISTANCE/DEVICE Description STG Adhere to Safety Precautions With Assistance/Device. 04/06/2017 1749 - Progressing by Melina ModenaBurchett, Merial Moritz, RN RH STG DECREASED RISK OF FALL WITH ASSISTANCE Description STG Decreased Risk of Fall With Assistance. 04/06/2017 1749 - Progressing by Melina ModenaBurchett, Mikko Lewellen, RN RH COGNITION-NURSING RH STG USES MEMORY AIDS/STRATEGIES W/ASSIST TO PROBLEM SOLVE Description STG Uses Memory Aids/Strategies With Assistance to Problem Solve. 04/06/2017 1749 - Progressing by Melina ModenaBurchett, Dangelo Guzzetta, RN RH STG ANTICIPATES NEEDS/CALLS FOR ASSIST W/ASSIST/CUES Description STG Anticipates Needs/Calls for Assist With Assistance/Cues. 04/06/2017 1749 - Progressing by Melina ModenaBurchett, Idamay Hosein, RN RH PAIN MANAGEMENT RH STG PAIN MANAGED AT OR BELOW PT'S PAIN GOAL 04/06/2017 1749 - Progressing by Melina ModenaBurchett, Deejay Koppelman, RN RH KNOWLEDGE DEFICIT RH STG INCREASE KNOWLEDGE OF DIABETES 04/06/2017 1749 - Progressing by  Melina ModenaBurchett, Deondrea Markos, RN RH STG INCREASE KNOWLEDGE OF HYPERTENSION 04/06/2017 1749 - Progressing by Melina ModenaBurchett, Shan Padgett, RN RH STG INCREASE KNOWLEDGE OF DYSPHAGIA/FLUID INTAKE 04/06/2017 1749 - Progressing by Melina ModenaBurchett, Jhovanny Guinta, RN

## 2017-04-06 NOTE — Progress Notes (Signed)
Speech Language Pathology Daily Session Note  Patient Details  Name: Casey Harrell MRN: [id number] Date of Birth: [birth year]  Today's Date: 04/06/2017 SLP Individual Time: 0930-1030 SLP Individual Time Calculation (min): 60 min  Short Term Goals: Week 1: SLP Short Term Goal 1 (Week 1): Pt will utilize external memory aids to recall information with Min A cues.  SLP Short Term Goal 2 (Week 1): Pt will complete mildly complex reasoning problems with Min A cues.  SLP Short Term Goal 3 (Week 1): Pt will demonstrate intellectual awareness by identifying 2 physical and 2 cognitive deficits with Mod A cues.  SLP Short Term Goal 4 (Week 1): Pt will utilize increased vocal intensity and over articulation at the simple conversation level with Min A cues to achieve ~90% intelligibility.   Skilled Therapeutic Interventions: Skilled treatment session focused on cognition goals. SLP facilitated session by providing Max A for simple problem solving of basic card game played in it's simplist form (Blink). Pt required supervision cues for semi-complex sorting task with question cues needed for orientation information. Pt was returned to room, left upright in wheelchair with all needs within reach. Continue per current plan of care.       Function:    Cognition Comprehension Comprehension assist level: Follows basic conversation/direction with extra time/assistive device  Expression   Expression assist level: Expresses basic 90% of the time/requires cueing < 10% of the time.  Social Interaction Social Interaction assist level: Interacts appropriately with others with medication or extra time (anti-anxiety, antidepressant).  Problem Solving Problem solving assist level: Solves basic 75 - 89% of the time/requires cueing 10 - 24% of the time  Memory Memory assist level: Recognizes or recalls 50 - 74% of the time/requires cueing 25 - 49% of the time    Pain    Therapy/Group: Individual Therapy  Clorene Nerio 04/06/2017, 10:05 AM

## 2017-04-06 NOTE — Progress Notes (Signed)
Subjective/Complaints: No ankle complaints today. Patient appears alert she remembers my name.  ROS- no CP SOB, abd pain, neg N/V Objective: Vital Signs: Blood pressure (!) 140/55, pulse 66, temperature 97.6 F (36.4 C), temperature source Oral, resp. rate 16, height '5\' 1"'$  (1.549 m), weight 63.4 kg (139 lb 12.4 oz), SpO2 95 %. No results found. Results for orders placed or performed during the hospital encounter of 04/03/17 (from the past 72 hour(s))  Glucose, capillary     Status: Abnormal   Collection Time: 04/03/17  9:24 PM  Result Value Ref Range   Glucose-Capillary 425 (H) 65 - 99 mg/dL  CBC WITH DIFFERENTIAL     Status: Abnormal   Collection Time: 04/04/17  6:35 AM  Result Value Ref Range   WBC 9.5 4.0 - 10.5 K/uL   RBC 4.44 3.87 - 5.11 MIL/uL   Hemoglobin 11.9 (L) 12.0 - 15.0 g/dL   HCT 37.1 36.0 - 46.0 %   MCV 83.6 78.0 - 100.0 fL   MCH 26.8 26.0 - 34.0 pg   MCHC 32.1 30.0 - 36.0 g/dL   RDW 15.3 11.5 - 15.5 %   Platelets 306 150 - 400 K/uL   Neutrophils Relative % 55 %   Neutro Abs 5.3 1.7 - 7.7 K/uL   Lymphocytes Relative 32 %   Lymphs Abs 3.0 0.7 - 4.0 K/uL   Monocytes Relative 8 %   Monocytes Absolute 0.8 0.1 - 1.0 K/uL   Eosinophils Relative 5 %   Eosinophils Absolute 0.4 0.0 - 0.7 K/uL   Basophils Relative 0 %   Basophils Absolute 0.0 0.0 - 0.1 K/uL  Comprehensive metabolic panel     Status: Abnormal   Collection Time: 04/04/17  6:35 AM  Result Value Ref Range   Sodium 136 135 - 145 mmol/L   Potassium 4.2 3.5 - 5.1 mmol/L   Chloride 101 101 - 111 mmol/L   CO2 27 22 - 32 mmol/L   Glucose, Bld 314 (H) 65 - 99 mg/dL   BUN 22 (H) 6 - 20 mg/dL   Creatinine, Ser 0.92 0.44 - 1.00 mg/dL   Calcium 9.4 8.9 - 10.3 mg/dL   Total Protein 6.9 6.5 - 8.1 g/dL   Albumin 3.4 (L) 3.5 - 5.0 g/dL   AST 15 15 - 41 U/L   ALT 14 14 - 54 U/L   Alkaline Phosphatase 90 38 - 126 U/L   Total Bilirubin 0.3 0.3 - 1.2 mg/dL   GFR calc non Af Amer 58 (L) >60 mL/min   GFR calc Af  Amer >60 >60 mL/min    Comment: (NOTE) The eGFR has been calculated using the CKD EPI equation. This calculation has not been validated in all clinical situations. eGFR's persistently <60 mL/min signify possible Chronic Kidney Disease.    Anion gap 8 5 - 15  Glucose, capillary     Status: Abnormal   Collection Time: 04/04/17  6:47 AM  Result Value Ref Range   Glucose-Capillary 318 (H) 65 - 99 mg/dL  Urinalysis, Routine w reflex microscopic     Status: Abnormal   Collection Time: 04/04/17 11:04 AM  Result Value Ref Range   Color, Urine YELLOW YELLOW   APPearance HAZY (A) CLEAR   Specific Gravity, Urine 1.012 1.005 - 1.030   pH 6.0 5.0 - 8.0   Glucose, UA 50 (A) NEGATIVE mg/dL   Hgb urine dipstick NEGATIVE NEGATIVE   Bilirubin Urine NEGATIVE NEGATIVE   Ketones, ur NEGATIVE NEGATIVE mg/dL   Protein,  ur NEGATIVE NEGATIVE mg/dL   Nitrite NEGATIVE NEGATIVE   Leukocytes, UA SMALL (A) NEGATIVE   RBC / HPF 0-5 0 - 5 RBC/hpf   WBC, UA 6-30 0 - 5 WBC/hpf   Bacteria, UA FEW (A) NONE SEEN   Squamous Epithelial / LPF 0-5 (A) NONE SEEN   Hyaline Casts, UA PRESENT    Ca Oxalate Crys, UA PRESENT   Glucose, capillary     Status: Abnormal   Collection Time: 04/04/17 12:10 PM  Result Value Ref Range   Glucose-Capillary 527 (HH) 65 - 99 mg/dL  Glucose, random     Status: Abnormal   Collection Time: 04/04/17 12:32 PM  Result Value Ref Range   Glucose, Bld 494 (H) 65 - 99 mg/dL  Glucose, capillary     Status: Abnormal   Collection Time: 04/04/17  2:07 PM  Result Value Ref Range   Glucose-Capillary 415 (H) 65 - 99 mg/dL  Glucose, capillary     Status: Abnormal   Collection Time: 04/04/17  5:06 PM  Result Value Ref Range   Glucose-Capillary 339 (H) 65 - 99 mg/dL  Glucose, capillary     Status: Abnormal   Collection Time: 04/04/17  9:33 PM  Result Value Ref Range   Glucose-Capillary 316 (H) 65 - 99 mg/dL  Glucose, capillary     Status: Abnormal   Collection Time: 04/05/17  6:34 AM  Result  Value Ref Range   Glucose-Capillary 197 (H) 65 - 99 mg/dL   Comment 1 Notify RN   Glucose, capillary     Status: Abnormal   Collection Time: 04/05/17 11:22 AM  Result Value Ref Range   Glucose-Capillary 257 (H) 65 - 99 mg/dL  Glucose, capillary     Status: Abnormal   Collection Time: 04/05/17  4:37 PM  Result Value Ref Range   Glucose-Capillary 342 (H) 65 - 99 mg/dL  Glucose, capillary     Status: Abnormal   Collection Time: 04/05/17  9:20 PM  Result Value Ref Range   Glucose-Capillary 244 (H) 65 - 99 mg/dL  Glucose, capillary     Status: Abnormal   Collection Time: 04/06/17  6:34 AM  Result Value Ref Range   Glucose-Capillary 180 (H) 65 - 99 mg/dL  Glucose, capillary     Status: Abnormal   Collection Time: 04/06/17 12:13 PM  Result Value Ref Range   Glucose-Capillary 277 (H) 65 - 99 mg/dL     HEENT: normal Cardio: irregular and no murmur Resp: CTA B/L and unlabored GI: BS positive and NT, ND Extremity:  Pulses positive and No Edema Skin:   Intact Neuro: Alert/Oriented, Abnormal Sensory oriented to day  but not month or place even with cuing, Abnormal Motor 3- R delt, 4- R triceps, 3- R biceps an grip, Abnormal FMC Ataxic/ dec FMC and Other RLE 3- HF, 4- KE, 3- ADF with pain limitation Musc/Skel:  Other pain over R achilles to palp , no calf swelling or tenderness, pain to palp ~ 15cm above bimalleolar line Gen NAD Reduced sensation in R L5 and S1 dermatome  Assessment/Plan: 1. Functional deficits secondary to Left pontine infarct which require 3+ hours per day of interdisciplinary therapy in a comprehensive inpatient rehab setting. Physiatrist is providing close team supervision and 24 hour management of active medical problems listed below. Physiatrist and rehab team continue to assess barriers to discharge/monitor patient progress toward functional and medical goals. FIM: Function - Bathing Position: Shower Body parts bathed by patient: Right arm, Right lower leg,  Left  arm, Left lower leg, Chest, Abdomen, Front perineal area, Buttocks, Right upper leg, Left upper leg Body parts bathed by helper: Back Assist Level: Touching or steadying assistance(Pt > 75%)  Function- Upper Body Dressing/Undressing What is the patient wearing?: Pull over shirt/dress Pull over shirt/dress - Perfomed by patient: Thread/unthread right sleeve, Thread/unthread left sleeve, Put head through opening, Pull shirt over trunk Assist Level: Supervision or verbal cues Function - Lower Body Dressing/Undressing What is the patient wearing?: Pants, Socks, Shoes Position: Wheelchair/chair at sink Pants- Performed by patient: Thread/unthread right pants leg, Thread/unthread left pants leg, Pull pants up/down Pants- Performed by helper: Thread/unthread right pants leg Non-skid slipper socks- Performed by patient: Don/doff left sock Non-skid slipper socks- Performed by helper: Don/doff right sock, Don/doff left sock Socks - Performed by patient: Don/doff left sock, Don/doff right sock Shoes - Performed by patient: Don/doff left shoe Shoes - Performed by helper: Don/doff right shoe TED Hose - Performed by helper: Don/doff right TED hose, Don/doff left TED hose Assist for footwear: Partial/moderate assist Assist for lower body dressing: Touching or steadying assistance (Pt > 75%)  Function - Toileting Toileting steps completed by patient: Adjust clothing prior to toileting, Performs perineal hygiene, Adjust clothing after toileting Toileting steps completed by helper: Adjust clothing after toileting Toileting Assistive Devices: Grab bar or rail Assist level: Touching or steadying assistance (Pt.75%)  Function - Air cabin crew transfer assistive device: Grab bar Assist level to toilet: Moderate assist (Pt 50 - 74%/lift or lower) Assist level from toilet: Moderate assist (Pt 50 - 74%/lift or lower)  Function - Chair/bed transfer Chair/bed transfer method: Stand pivot Chair/bed  transfer assist level: Moderate assist (Pt 50 - 74%/lift or lower) Chair/bed transfer assistive device: Armrests, Walker  Function - Locomotion: Wheelchair Max wheelchair distance: 50 Assist Level: Moderate assistance (Pt 50 - 74%) Assist Level: Moderate assistance (Pt 50 - 74%) Wheel 150 feet activity did not occur: Safety/medical concerns Turns around,maneuvers to table,bed, and toilet,negotiates 3% grade,maneuvers on rugs and over doorsills: No Function - Locomotion: Ambulation Assistive device: Walker-rolling Max distance: 16f Assist level: Moderate assist (Pt 50 - 74%) Assist level: Moderate assist (Pt 50 - 74%) Walk 50 feet with 2 turns activity did not occur: Safety/medical concerns Walk 150 feet activity did not occur: Safety/medical concerns Walk 10 feet on uneven surfaces activity did not occur: Safety/medical concerns  Function - Comprehension Comprehension: Auditory Comprehension assist level: Follows basic conversation/direction with extra time/assistive device  Function - Expression Expression: Verbal Expression assist level: Expresses basic 90% of the time/requires cueing < 10% of the time.  Function - Social Interaction Social Interaction assist level: Interacts appropriately with others with medication or extra time (anti-anxiety, antidepressant).  Function - Problem Solving Problem solving assist level: Solves basic 75 - 89% of the time/requires cueing 10 - 24% of the time  Function - Memory Memory assist level: Recognizes or recalls 50 - 74% of the time/requires cueing 25 - 49% of the time Patient normally able to recall (first 3 days only): That he or she is in a hospital, Current season  Medical Problem List and Plan: 1.Increased Right-sided weaknesssecondary to acute infarction left pons as well as history of CVA posterior cerebral artery December 2016 with right-sided residual weakness -CIR PT, OT, SLP evals 2. DVT  Prophylaxis/Anticoagulation: Eliquis 3. Pain Management:Lyrica 150 mg daily, Cymbalta 30 mg twice a day,? Diabetic neuropathy- no symmetric numbness  Voltaren gel -right high  ankle sprain-----ice, PRAFO boot for support,Air cast able to  bear wt per PT -improving                  4. Mood:Provide emotional support 5. Neuropsych: This patientisnot capable of making decisions on herown behalf. Poor orientation will ask neuropsych to eval 6. Skin/Wound Care:Routine skin checks 7. Fluids/Electrolytes/Nutrition:Routine I&O's with follow-up chemistries 8.Diabetes mellitus and peripheral neuropathy. Hemoglobin A1c 8.6. Levemir 30 units twice a day. Check blood sugars before meals and at bedtime CBG (last 3)  Recent Labs    04/05/17 2120 04/06/17 0634 04/06/17 1213  GLUCAP 244* 180* 277*  Improving with Levemir will increase to 40U BID 9.Hypothyroidism. Synthroid 10.Hypertension. Avapro 150 mg daily, Norvasc 5 mg daily, Coreg 6.25 mg twice a day. Vitals:   04/05/17 0500 04/05/17 1545  BP: (!) 155/55 (!) 140/55  Pulse: 75 66  Resp: 18 16  Temp: 97.7 F (36.5 C) 97.6 F (36.4 C)  SpO2: 94% 95%  Control in desired range at present 11/30 11.Hyperlipidemia. Lipitor 12.GERD. Protonix Right   LOS (Days) 3 A FACE TO FACE EVALUATION WAS PERFORMED  Charlett Blake 04/06/2017, 12:26 PM

## 2017-04-06 NOTE — Progress Notes (Signed)
Occupational Therapy Session Note  Patient Details  Name: Casey Harrell MRN: [id number] Date of Birth: 1935-01-14  Today's Date: 04/06/2017 OT Individual Time: [id number] OT Individual Time Calculation (min): 60 min    Short Term Goals: Week 1:  OT Short Term Goal 1 (Week 1): Pt will complete 3/3 toileting task with CGA OT Short Term Goal 2 (Week 1): Pt will dress LB with steadying assist OT Short Term Goal 3 (Week 1): Pt will use R UE at dominatnt level during grooming task with min cuing  OT Short Term Goal 4 (Week 1): Pt will utilize hemi dressing technique with min cuing OT Short Term Goal 5 (Week 1): Pt will be oriented to place and situation with min questioning cues  Skilled Therapeutic Interventions/Progress Updates:    Pt seen for OT ADL bathing/dressing session. Pt in supine upon arrival with RN present administering pain medications, agreeable to tx session. She was oriented x4 this A.M. She transferred to EOB with min A using hospital bed functions. She completed functional transfers with min-mod HHA and use of grab bar when availabe. Completed toileting task with steadying assist. She returned to sink and completed oral care from standing position with CGA, VCs for upright posture. She was able to tolerate ~4 minutes in standing to complete task. She dressed seated in w/c, VCs to recall hemi dressing technique. [facility] with mod A and min steadying assist while pulling pants up. Rest breaks required throughout dressing session due to decreased activity tolerance and amount of effort requeired to complete tasks.  Following rest break, pt ambulated 7145ft in hallway with CGA using RW before requiring seated rest break, VCs for upright posture.  Pt left seated in w/c with hand off to SLP.   Therapy Documentation Precautions:  Precautions Precautions: Fall Precaution Comments: R ankle bruising Restrictions Weight Bearing Restrictions: No Pain:   No/ denies pain  See  Function Navigator for Current Functional Status.   Therapy/Group: Individual Therapy  Lewis, Margot Oriordan C 04/06/2017, 6:47 AM

## 2017-04-06 NOTE — Progress Notes (Signed)
Physical Therapy Session Note  Patient Details  Name: Casey Harrell MRN: [id number] Date of Birth: [birth year]  Today's Date: 04/06/2017 PT Individual Time: 1100-1200 1450-1515 PT Individual Time Calculation (min): 60 min 25 min   Short Term Goals: Week 1:  PT Short Term Goal 1 (Week 1): Pt will perform bed mobility with min assist  PT Short Term Goal 2 (Week 1): Pt will perform sit<>stand with min assist consistently  PT Short Term Goal 3 (Week 1): Pt will performed stand pivot WC with min assist and LRAD  PT Short Term Goal 4 (Week 1): Pt will ambulate 79f with min assist  PT Short Term Goal 5 (Week 1): Pt will propell WC 1064fwith min assist.   Skilled Therapeutic Interventions/Progress Updates:  Session 1   Pt received supine in bed and agreeable to PT. Supine>sit transfer with supervisoin assist and min for safety   Stand pivot transfers with RW and  min assist throughout treatment. Sit<>stand transfers also performed with min assist throughout treatment. Min cues for proper UE placement and safety to prevent lateral LOB.   Gait training with min fading to mod assist x 10026fith RW. Cues from PT for increased step length and height on the R LE, as well as manual facilitation  for weight shifting to prevent foot drag.   Nustep reciprocal movement training x 8 minutes with 1 rest break. Moderate cues from PT throughout to improve attention to the RUE as well as full ROM on the RLE.   WC mobility training instructed by PT after PT applied tband to R wheel rim to improve grip and control. . With BUE x 22f91fth min-mod assist. 100ft4fh BLE and supervision assist. Moderate cues from PT to attention to the RLE. And RUE throughout WC mobility.   Patient returned to room and [facility] and all needs met.     Session 2.   Pt received sitting in WC and agreeable to PT  Gait training with RW and min assist x 140ft 26f min cues for step height. Pt noted  to turn head quickly at end of gait training resulting in dizziness and need to sit down. Vitals assessed in sitting 146/69, HR 67.   Stand NMR HS curls x 10. Hip abduction x 10. Sit<>stand x 5. UE support on parallel bars throughout NMR with min cues for improved eccentric control as well as posture and RLE stability.   Transfers with min assist throughout treatment and BUE support on RW. Improved safety compared to previous treatment session.   Patient returned to room and [facility] reach and all needs met.         Therapy Documentation Precautions:  Precautions Precautions: Fall Precaution Comments: R ankle bruising Restrictions Weight Bearing Restrictions: No Vital Signs: Therapy Vitals Temp: (!) 97.5 F (36.4 C) Temp Source: Oral Pulse Rate: (!) 58 Resp: 18 BP: 131/66 Patient Position (if appropriate): Lying Oxygen Therapy SpO2: 100 % O2 Device: Not Delivered Pain: 0/10  See Function Navigator for Current Functional Status.   Therapy/Group: Individual Therapy  AustinLorie Phenix/2018, 4:38 PM

## 2017-04-07 ENCOUNTER — Inpatient Hospital Stay (HOSPITAL_COMMUNITY): Payer: Medicare Other | Admitting: Physical Therapy

## 2017-04-07 ENCOUNTER — Inpatient Hospital Stay (HOSPITAL_COMMUNITY): Payer: Medicare Other | Admitting: Occupational Therapy

## 2017-04-07 ENCOUNTER — Inpatient Hospital Stay (HOSPITAL_COMMUNITY): Payer: Medicare Other | Admitting: Speech Pathology

## 2017-04-07 LAB — GLUCOSE, CAPILLARY
Glucose-Capillary: 248 mg/dL — ABNORMAL HIGH (ref 65–99)
Glucose-Capillary: 310 mg/dL — ABNORMAL HIGH (ref 65–99)
Glucose-Capillary: 241 mg/dL — ABNORMAL HIGH (ref 65–99)
Glucose-Capillary: 229 mg/dL — ABNORMAL HIGH (ref 65–99)

## 2017-04-07 MED ORDER — INSULIN ASPART 100 UNIT/ML ~~LOC~~ SOLN
0.0000 [IU] | Freq: Three times a day (TID) | SUBCUTANEOUS | Status: DC
  Administered 2017-04-07: 11 [IU] via SUBCUTANEOUS
  Administered 2017-04-07 – 2017-04-08 (×3): 5 [IU] via SUBCUTANEOUS
  Administered 2017-04-08: 15 [IU] via SUBCUTANEOUS
  Administered 2017-04-09: 3 [IU] via SUBCUTANEOUS
  Administered 2017-04-09: 15 [IU] via SUBCUTANEOUS
  Administered 2017-04-10 – 2017-04-11 (×4): 2 [IU] via SUBCUTANEOUS
  Administered 2017-04-11 (×2): 3 [IU] via SUBCUTANEOUS
  Administered 2017-04-12: 5 [IU] via SUBCUTANEOUS
  Administered 2017-04-13: 3 [IU] via SUBCUTANEOUS
  Administered 2017-04-13 (×2): 2 [IU] via SUBCUTANEOUS

## 2017-04-07 MED ORDER — INSULIN ASPART 100 UNIT/ML ~~LOC~~ SOLN
4.0000 [IU] | Freq: Three times a day (TID) | SUBCUTANEOUS | Status: DC
  Administered 2017-04-07 – 2017-04-08 (×3): 4 [IU] via SUBCUTANEOUS

## 2017-04-07 MED ORDER — INSULIN ASPART 100 UNIT/ML ~~LOC~~ SOLN
0.0000 [IU] | Freq: Every day | SUBCUTANEOUS | Status: DC
  Administered 2017-04-07: 2 [IU] via SUBCUTANEOUS
  Administered 2017-04-08: 3 [IU] via SUBCUTANEOUS
  Administered 2017-04-09: 4 [IU] via SUBCUTANEOUS
  Administered 2017-04-10 – 2017-04-13 (×2): 2 [IU] via SUBCUTANEOUS

## 2017-04-07 NOTE — Progress Notes (Signed)
Speech Language Pathology Daily Session Note  Patient Details  Name: Casey Harrell MRN: [id number] Date of Birth: Jun 03, 1934  Today's Date: 04/07/2017 SLP Individual Time: 1430-1500 SLP Individual Time Calculation (min): 30 min  Short Term Goals: Week 1: SLP Short Term Goal 1 (Week 1): Pt will utilize external memory aids to recall information with Min A cues.  SLP Short Term Goal 2 (Week 1): Pt will complete mildly complex reasoning problems with Min A cues.  SLP Short Term Goal 3 (Week 1): Pt will demonstrate intellectual awareness by identifying 2 physical and 2 cognitive deficits with Mod A cues.  SLP Short Term Goal 4 (Week 1): Pt will utilize increased vocal intensity and over articulation at the simple conversation level with Min A cues to achieve ~90% intelligibility.   Skilled Therapeutic Interventions: Skilled treatment session focused on cognitive goals. SLP facilitated session by providing Min A to supervision question cues to recall therapy schedule and activities within therapies from AM. Pt left in bed, falling asleep with son present. Continue per current plan of care.      Function:    Cognition Comprehension Comprehension assist level: Follows basic conversation/direction with extra time/assistive device  Expression   Expression assist level: Expresses basic 90% of the time/requires cueing < 10% of the time.  Social Interaction Social Interaction assist level: Interacts appropriately with others with medication or extra time (anti-anxiety, antidepressant).  Problem Solving Problem solving assist level: Solves basic 75 - 89% of the time/requires cueing 10 - 24% of the time  Memory Memory assist level: Recognizes or recalls 50 - 74% of the time/requires cueing 25 - 49% of the time    Pain    Therapy/Group: Individual Therapy  Janaria Mccammon 04/07/2017, 4:57 PM
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
## 2017-04-07 NOTE — Plan of Care (Signed)
  Progressing RH BOWEL ELIMINATION RH STG MANAGE BOWEL WITH ASSISTANCE Description STG Manage Bowel with Assistance. 04/07/2017 1710 - Progressing by Melina ModenaBurchett, Laikyn Gewirtz, RN RH STG MANAGE BOWEL W/MEDICATION W/ASSISTANCE Description STG Manage Bowel with Medication with Assistance. 04/07/2017 1710 - Progressing by Melina ModenaBurchett, Trafton Roker, RN RH STG MANAGE BOWEL W/EQUIPMENT W/ASSISTANCE Description STG Manage Bowel With Equipment With Assistance 04/07/2017 1710 - Progressing by Melina ModenaBurchett, Jasier Calabretta, RN RH BLADDER ELIMINATION RH STG MANAGE BLADDER WITH ASSISTANCE Description STG Manage Bladder With Assistance 04/07/2017 1710 - Progressing by Melina ModenaBurchett, Caydn Justen, RN RH SKIN INTEGRITY RH STG SKIN FREE OF INFECTION/BREAKDOWN 04/07/2017 1710 - Progressing by Melina ModenaBurchett, Quincie Haroon, RN RH STG MAINTAIN SKIN INTEGRITY WITH ASSISTANCE Description STG Maintain Skin Integrity With Assistance. 04/07/2017 1710 - Progressing by Melina ModenaBurchett, Shyonna Carlin, RN RH COGNITION-NURSING RH STG USES MEMORY AIDS/STRATEGIES W/ASSIST TO PROBLEM SOLVE Description STG Uses Memory Aids/Strategies With Assistance to Problem Solve. 04/07/2017 1710 - Progressing by Melina ModenaBurchett, Gaylin Bulthuis, RN RH STG ANTICIPATES NEEDS/CALLS FOR ASSIST W/ASSIST/CUES Description STG Anticipates Needs/Calls for Assist With Assistance/Cues. 04/07/2017 1710 - Progressing by Melina ModenaBurchett, Malon Siddall, RN RH PAIN MANAGEMENT RH STG PAIN MANAGED AT OR BELOW PT'S PAIN GOAL 04/07/2017 1710 - Progressing by Melina ModenaBurchett, Laurel Smeltz, RN RH KNOWLEDGE DEFICIT RH STG INCREASE KNOWLEDGE OF DIABETES 04/07/2017 1710 - Progressing by Melina ModenaBurchett, Ashlin Hidalgo, RN RH STG INCREASE KNOWLEDGE OF HYPERTENSION 04/07/2017 1710 - Progressing by Melina ModenaBurchett, Zebastian Carico, RN RH STG INCREASE KNOWLEDGE OF DYSPHAGIA/FLUID INTAKE 04/07/2017 1710 - Progressing by Melina ModenaBurchett, [place] Lang[place], RN   Pt and spouse educated on spouse not assisting with transfers to prevent falls

## 2017-04-07 NOTE — Progress Notes (Signed)
  Subjective/Complaints: No complaints Cheerful and alert  Objective: Vital Signs: Blood pressure (!) 161/62, pulse 70, temperature (!) 97.5 F (36.4 C), temperature source Oral, resp. rate 16, height 5\' 1"  (1.549 m), weight 139 lb 12.4 oz (63.4 kg), SpO2 91 %.  nad Chest, cta cv- reg rate abd- soft, NT Ext- no edema Neuro- alert  Assessment/Plan: 1. Functional deficits secondary to Left pontine infarct    Medical Problem List and Plan: 1.Increased Right-sided weaknesssecondary to acute infarction left pons as well as history of CVA posterior cerebral artery December 2016 with right-sided residual weakness CIRs 2. DVT Prophylaxis/Anticoagulation: Eliquis 3. Pain Management:she denies pain  4. Mood:Provide emotional support 5. Neuropsych: This patientisnot capable of making decisions on herown behalf.  6. Skin/Wound Care:Routine skin checks 7. Fluids/Electrolytes/Nutrition:Routine I&O's with follow-up chemistries 8.Diabetes mellitus and peripheral neuropathy. Poor control CBG (last 3)  Recent Labs    04/06/17 1633 04/06/17 2029 04/07/17 0647  GLUCAP 369* 349* 248*   9.Hypothyroidism. Synthroid 10.Hypertension.  Vitals:   04/06/17 1305 04/07/17 0500  BP: 131/66 (!) 161/62  Pulse: (!) 58 70  Resp: 18 16  Temp: (!) 97.5 F (36.4 C) (!) 97.5 F (36.4 C)  SpO2: 100% 91%  Control in desired range at present 11/30 11.Hyperlipidemia. Lipitor 12.GERD. Protonix Right   LOS (Days) 4 A FACE TO FACE EVALUATION WAS PERFORMED  Bruce H Swords 04/07/2017, 10:23 AM

## 2017-04-07 NOTE — Progress Notes (Signed)
Physical Therapy Session Note  Patient Details  Name: Casey Harrell MRN: [id number] Date of Birth: February 20, 1935  Today's Date: 04/07/2017 PT Individual Time: 1505-1600 PT Individual Time Calculation (min): 55 min   Short Term Goals: Week 1:  PT Short Term Goal 1 (Week 1): Pt will perform bed mobility with min assist  PT Short Term Goal 2 (Week 1): Pt will perform sit<>stand with min assist consistently  PT Short Term Goal 3 (Week 1): Pt will performed stand pivot WC with min assist and LRAD  PT Short Term Goal 4 (Week 1): Pt will ambulate 2f with min assist  PT Short Term Goal 5 (Week 1): Pt will propell WC 105fwith min assist.   Skilled Therapeutic Interventions/Progress Updates:   Pt received supine in bed and agreeable to PT. Supine>sit transfer with min assist and min cues for posture and use of UE.    [facility] and [facility]   Pt transported to PT gym. Stand pivot transfer to mat table with mod assist and R LE instability. Pt informed PT that she did not have on air cast. PT applied air cast and performed 2 stand pivot transfers without any pain or instability.   Dynamic standing balance with Wii fit and reaching tasks. Moderate cues and min assist for improved use of ankle strategy for lateral weight shifting with Wii. Moderate cues and assist from PT for lateral reach for basketball goal due to R knee instability and poor righting reactions when reaching outside BOS.   Pt returned to room and performed standing pivot transfer to bed with min assist. Sit>supine completed with min assist and left supine in bed with call bell in reach and all needs met.         Therapy Documentation Precautions:  Precautions Precautions: Fall Precaution Comments: R ankle bruising Restrictions Weight Bearing Restrictions: No   See Function Navigator for Current Functional Status.   Therapy/Group: Individual Therapy  AuLorie Phenix2/05/2016, 4:03 PM

## 2017-04-07 NOTE — Plan of Care (Signed)
  Not Progressing RH BLADDER ELIMINATION RH STG MANAGE BLADDER WITH ASSISTANCE Description STG Manage Bladder With mod Assistance  [facility]. 04/07/2017 2312 - Not Progressing by Martina SinnerMurray, Semone Orlov A, RN RH SAFETY RH STG ADHERE TO SAFETY PRECAUTIONS W/ASSISTANCE/DEVICE Description STG Adhere to Safety Precautions With min Assistance/Device.  Up without assistance on previous shift, safety plan adjusted. 04/07/2017 2312 - Not Progressing by Martina SinnerMurray, Yajahira Tison A, RN

## 2017-04-08 ENCOUNTER — Inpatient Hospital Stay (HOSPITAL_COMMUNITY): Payer: Medicare Other | Admitting: Physical Therapy

## 2017-04-08 LAB — URINE CULTURE: Culture: >=100000 — AB

## 2017-04-08 LAB — GLUCOSE, CAPILLARY
Glucose-Capillary: 211 mg/dL — ABNORMAL HIGH (ref 65–99)
Glucose-Capillary: 357 mg/dL — ABNORMAL HIGH (ref 65–99)
Glucose-Capillary: 221 mg/dL — ABNORMAL HIGH (ref 65–99)
Glucose-Capillary: 290 mg/dL — ABNORMAL HIGH (ref 65–99)

## 2017-04-08 MED ORDER — INSULIN DETEMIR 100 UNIT/ML ~~LOC~~ SOLN
40.0000 [IU] | Freq: Two times a day (BID) | SUBCUTANEOUS | Status: DC
  Administered 2017-04-08 – 2017-04-14 (×12): 40 [IU] via SUBCUTANEOUS
  Filled 2017-04-08 (×12): qty 0.4

## 2017-04-08 MED ORDER — INSULIN ASPART 100 UNIT/ML ~~LOC~~ SOLN
8.0000 [IU] | Freq: Three times a day (TID) | SUBCUTANEOUS | Status: DC
  Administered 2017-04-08 – 2017-04-14 (×18): 8 [IU] via SUBCUTANEOUS

## 2017-04-08 NOTE — Plan of Care (Signed)
  Progressing RH COGNITION-NURSING RH STG USES MEMORY AIDS/STRATEGIES W/ASSIST TO PROBLEM SOLVE Description STG Uses Memory Aids/Strategies With min Assistance to Problem Solve.  04/08/2017 1139 - Progressing by Melina ModenaBurchett, Nili Honda, RN RH STG ANTICIPATES NEEDS/CALLS FOR ASSIST W/ASSIST/CUES Description STG Anticipates Needs/Calls for Assist With min Assistance/Cues.  04/08/2017 1139 - Progressing by Melina ModenaBurchett, Amy Belloso, RN   Pt using call bell appropriately this shift to get on and off the toilet, staff staying with pt during toileting

## 2017-04-08 NOTE — Progress Notes (Signed)
Occupational Therapy Session Note  Patient Details  Name: Atilano MedianKay F Wynia MRN: [id number] Date of Birth: October 07, 1934  Today's Date: 04/08/2017 OT Individual Time:  - [phone] =60 min     Short Term Goals: Week 1:  OT Short Term Goal 1 (Week 1): Pt will complete 3/3 toileting task with CGA OT Short Term Goal 2 (Week 1): Pt will dress LB with steadying assist OT Short Term Goal 3 (Week 1): Pt will use R UE at dominatnt level during grooming task with min cuing  OT Short Term Goal 4 (Week 1): Pt will utilize hemi dressing technique with min cuing OT Short Term Goal 5 (Week 1): Pt will be oriented to place and situation with min questioning cues  Skilled Therapeutic Interventions/Progress Updates: this session patient & dtr-in-law participated in skilled OT to address increasing independence in bathing and dressing, given right hemiparesis and somecognitivie challenges as follows:  Patient in w/c upon approach and concurred to work on skilled bathing and dressing (in w/c at sink)  UB bathing and dressing = extra time due to c/o fatigue and to process the hemidressing technique  LB bathing and dressing= Mod A due to patient weakness and needing help to maintain staning endurance while pulling up her brief and pants and to don her right ankle AFO  dtr - in law was able to return demonstation for the various techniques to assist Mrs. Hataway with safe safe care.    Though she watched the w/c to bed transfer, but she did not demonstrate the technique (patient very fatigued by this point and needed to ly down to rest)  NOTE:  Later in the daly nsg reported walking into patient bathroom to find that patient and/or husband attempted to get pt off toilet as she was seated not posturally safe in her w/c and with brief and pants down (He urgently needed to toilet and attempted to use hers though she was on the toilet).   Nurse stated she informed patient and husband not to allow him to help with transfer again  and to utilize call bell when finished on toilet.   As well, she advised husband to use hallway bathroom.   Though wife stated he would have wet his pants before getting there.   Staff offered information on 'Depends' Pullup type continence assist for urgency and educated on the great danger of the two or one of them attempting patient transfers.         Therapy Documentation Precautions:  Precautions Precautions: Fall Precaution Comments: R ankle bruising Restrictions Weight Bearing Restrictions: No Pain:denied   See Function Navigator for Current Functional Status.   Therapy/Group: Individual Therapy  Bud Faceickett, Renell Coaxum [facility] Center[facility] 04/08/2017, 1:44 PM

## 2017-04-08 NOTE — Progress Notes (Signed)
  Subjective/Complaints: Sleeping.  Easily awakened.  She is cheerful and alert.  Objective: Vital Signs: Blood pressure (!) 166/84, pulse (!) 58, temperature 97.7 F (36.5 C), temperature source Oral, resp. rate 16, height 5\' 1"  (1.549 m), weight 139 lb 12.4 oz (63.4 kg), SpO2 94 %. Pleasant female.  No acute distress.  Chest clear to auscultation.  Cardiac exam S1 and S2 are regular.  Abdominal exam active bowel sounds, soft, nontender.  Extremities without edema.  Neurologic exam she is alert after awakens from sleep.  Assessment/Plan: 1. Functional deficits secondary to Left pontine infarct    Medical Problem List and Plan: 1.Increased Right-sided weaknesssecondary to acute infarction left pons as well as history of CVA posterior cerebral artery December 2016 with right-sided residual weakness CIRs 2. DVT Prophylaxis/Anticoagulation: Eliquis 3. Pain Management:she denies pain  4. Mood:Provide emotional support 5. Neuropsych: This patientisnot capable of making decisions on herown behalf. 6. Skin/Wound Care:Routine skin checks 7. Fluids/Electrolytes/Nutrition:Routine I&O's with follow-up chemistries Basic Metabolic Panel:    Component Value Date/Time   NA 136 04/04/2017 0635   K 4.2 04/04/2017 0635   CL 101 04/04/2017 0635   CO2 27 04/04/2017 0635   BUN 22 (H) 04/04/2017 0635   CREATININE 0.92 04/04/2017 0635   CREATININE 0.84 04/19/2012 1700   GLUCOSE 494 (H) 04/04/2017 1232   CALCIUM 9.4 04/04/2017 0635    8.Diabetes mellitus and peripheral neuropathy. Poor control.  Adjusted insulin regimen today.  Increased Levemir and mealtime insulin. CBG (last 3)  Recent Labs    04/07/17 1640 04/07/17 2117 04/08/17 0636  GLUCAP 241* 229* 211*   9.Hypothyroidism. Synthroid 10.Hypertension.  Vitals:   04/07/17 1500 04/08/17 0514  BP: 139/64 (!) 166/84  Pulse: 71 (!) 58  Resp: 18 16  Temp: (!) 97.5 F (36.4 C) 97.7 F (36.5 C)  SpO2: 95% 94%  Control in  desired range at present 11/30 11.Hyperlipidemia. Lipitor 12.GERD. Protonix Right   LOS (Days) 5 A FACE TO FACE EVALUATION WAS PERFORMED  Samarth Ogle H Niv Darley 04/08/2017, 8:57 AM

## 2017-04-08 NOTE — Progress Notes (Addendum)
Occupational Therapy Session Note  Patient Details  Name: Casey Harrell MRN: [id number] Date of Birth: [birth year]  Today's Date: 04/08/2017 OT Individual Time:  -       Short Term Goals: Week 1:  OT Short Term Goal 1 (Week 1): Pt will complete 3/3 toileting task with CGA OT Short Term Goal 2 (Week 1): Pt will dress LB with steadying assist OT Short Term Goal 3 (Week 1): Pt will use R UE at dominatnt level during grooming task with min cuing  OT Short Term Goal 4 (Week 1): Pt will utilize hemi dressing technique with min cuing OT Short Term Goal 5 (Week 1): Pt will be oriented to place and situation with min questioning cues  Skilled Therapeutic Interventions/Progress Updates: NOTE:  nsg reported walking into patient bathroom to find that patient and/or husband attempted to get pt off toilet as she was seated not posturally safe in her w/c and with brief and pants down (He urgently needed to toilet and attempted to use hers though she was on the toilet).   Nurse stated she informed patient and husband not to allow him to help with transfer again and to utilize call bell when finished on toilet.   As well, she advised husband to use hallway bathroom.   Reportedly to this wife stated he would have wet his pants before getting there.   Staff offered information on 'Depends' Pullup type continence assist for urgency and educated on the great danger of the two or one of them attempting patient transfers.      So, nurse asked this patient to help educate husband and patient on toilet transfers to be complete by/wife staff at this point - no family members or friends have been signed off on helping with transfers yet.  So this session, patient completed toilet transfer and demonstrated understanding for how to use the bathroom call bell.  (toilet transfer=moderate assist - as compared to min A earlier in the day - patient fatigue was big factor at this later transfer in the day  & patient was able to wipe  self and required assist to pull up her brief and pants due to fatigue)  Patient indicated that her husband had never used depends type pull up undergarments but both she and he stated he might purchase some due to his urge to need to quickly get to toilets for voiding.  Patient completed w/c to bed transfer with Min A and bed mobility with cues and  Min A.    She was left in bed sleeping holding hands with her husband and with call bell in place.      Therapy Documentation Precautions:  Precautions Precautions: Fall Precaution Comments: R ankle bruising Restrictions Weight Bearing Restrictions: No  Pain:denied    therapy/Group: Individual Therapy  Bud Faceickett, Casey Harrell [facility] 04/08/2017, 3:05 PM

## 2017-04-08 NOTE — Progress Notes (Signed)
Complained of aching to dorsum area of right foot. PRN tylenol given with relief. Incontinent of urine at HS, requiring max assist with hygiene and linen change. Right PRAFO in place.Casey MartinezMurray, [name]

## 2017-04-08 NOTE — Progress Notes (Signed)
Physical Therapy Session Note  Patient Details  Name: Casey Harrell MRN: [id number] Date of Birth: 10-26-1934  Today's Date: 04/08/2017 PT Individual Time: 1500-1530 PT Individual Time Calculation (min): 30 min   Short Term Goals: Week 1:  PT Short Term Goal 1 (Week 1): Pt will perform bed mobility with min assist  PT Short Term Goal 2 (Week 1): Pt will perform sit<>stand with min assist consistently  PT Short Term Goal 3 (Week 1): Pt will performed stand pivot WC with min assist and LRAD  PT Short Term Goal 4 (Week 1): Pt will ambulate 6830ft with min assist  PT Short Term Goal 5 (Week 1): Pt will propell WC 1400ft with min assist.   Skilled Therapeutic Interventions/Progress Updates:  Pt was seen bedside in the pm. Pt transferred supine to edge of bed with min A and increased time. Pt performed multiple sit to stand and stand pivot transfers with rolling walker and min A with verbal cues. Pt ambulated 50 feet x 3 with rolling walker and min A with verbal cues. Pt returned to bed following treatment with head of bed elevated and call bell within reach.   Therapy Documentation Precautions:  Precautions Precautions: Fall Precaution Comments: R ankle bruising Restrictions Weight Bearing Restrictions: No General:   Pain: No c/o pain.   See Function Navigator for Current Functional Status.   Therapy/Group: Individual Therapy  Rayford HalstedMitchell, Analiyah Lechuga G 04/08/2017, 3:37 PM

## 2017-04-09 ENCOUNTER — Inpatient Hospital Stay (HOSPITAL_COMMUNITY): Payer: Medicare Other | Admitting: Physical Therapy

## 2017-04-09 ENCOUNTER — Inpatient Hospital Stay (HOSPITAL_COMMUNITY): Payer: Medicare Other | Admitting: Speech Pathology

## 2017-04-09 ENCOUNTER — Inpatient Hospital Stay (HOSPITAL_COMMUNITY): Payer: Medicare Other | Admitting: Occupational Therapy

## 2017-04-09 LAB — GLUCOSE, CAPILLARY
Glucose-Capillary: 177 mg/dL — ABNORMAL HIGH (ref 65–99)
Glucose-Capillary: 378 mg/dL — ABNORMAL HIGH (ref 65–99)
Glucose-Capillary: 207 mg/dL — ABNORMAL HIGH (ref 65–99)
Glucose-Capillary: 97 mg/dL (ref 65–99)
Glucose-Capillary: 350 mg/dL — ABNORMAL HIGH (ref 65–99)

## 2017-04-09 MED ORDER — METFORMIN HCL 500 MG PO TABS
500.0000 mg | ORAL_TABLET | Freq: Every day | ORAL | Status: DC
  Administered 2017-04-10 – 2017-04-11 (×2): 500 mg via ORAL
  Filled 2017-04-09 (×2): qty 1

## 2017-04-09 NOTE — Progress Notes (Signed)
Physical Therapy Session Note  Patient Details  Name: Casey Harrell MRN: [id number] Date of Birth: 1934/05/29  Today's Date: 04/09/2017 PT Individual Time: 1135-1203 and 1435-1535 PT Individual Time Calculation (min): 28 min and 60 min  Short Term Goals: Week 1:  PT Short Term Goal 1 (Week 1): Pt will perform bed mobility with min assist  PT Short Term Goal 2 (Week 1): Pt will perform sit<>stand with min assist consistently  PT Short Term Goal 3 (Week 1): Pt will performed stand pivot WC with min assist and LRAD  PT Short Term Goal 4 (Week 1): Pt will ambulate 27f with min assist  PT Short Term Goal 5 (Week 1): Pt will propell WC 1063fwith min assist.   Skilled Therapeutic Interventions/Progress Updates:  Tx1: Pt presented in w/c with family present agreeable to therapy. Participated in w/c mobility with min/modA 5063fith BUE and cues for increasing use of RUE. Pt transported to rehab gym for remaining distance. Performed stand pivot to mat and participated in sit to/from stand x 5 no AD with cues for decreased pushing of BLE against mat. Pt ambulated [id number] with RW and min A fading to min guard with cues for increasing RLE advancement. Pt returned to room and left with QRB placed, call bell within reach and needs met.   Tx2: Pt presented in bed sleeping but easily awoken and agreeable to therapy. Performed bed mobility supervision with use of features and increased time. Pt donned sneakers supervision. Performed stand pivot to w/c and transported pt tp rehab gym. PTA changed w/c to 16x16 which pt described as feeling better. Participated in standing balance with use of Airex and peg board. Forced use of RUE for peg board use. Pt able to maintain static balance on Airecx with min guard however required cues for maintaining equal balance and RLE activation as became fatigued. Pt required x 4 seated rest breaks due to fatigue while performing activity. Performed w/c propulsion x [id number]  x4  extremities with minA for coordination. Pt returned to room and performed stand pivot to bed in same manner as prior. Pt left in bed at end of session with bed alarm on and current needs met.      Therapy Documentation Precautions:  Precautions Precautions: Fall Precaution Comments: R ankle bruising Restrictions Weight Bearing Restrictions: No General:   Vital Signs:  Pain: Pain Assessment Pain Assessment: No/denies pain Pain Score: 0-No pain Multiple Pain Sites: No   See Function Navigator for Current Functional Status.   Therapy/Group: Individual Therapy  Maclean Foister  Adison Reifsteck, PTA  04/09/2017, 12:17 PM

## 2017-04-09 NOTE — Plan of Care (Signed)
  Not Progressing RH BOWEL ELIMINATION RH STG MANAGE BOWEL WITH ASSISTANCE Description STG Manage Bowel with min. Assistance.  Continues to be incontinent at HS requiring max assistance. 04/09/2017 0009 - Not Progressing by Martina SinnerMurray, Edder Bellanca A, RN RH BLADDER ELIMINATION RH STG MANAGE BLADDER WITH ASSISTANCE Description STG Manage Bladder With mod Assistance  Incontinent at HS, requiring max assistance. 04/09/2017 0009 - Not Progressing by Martina SinnerMurray, Shandrell Boda A, RN

## 2017-04-09 NOTE — Progress Notes (Signed)
+/-   sleep. Incontinent of urine, decreased initiative to call when needs to void or when incontinent. At 0325, PRN tylenol given for complaint of right foot discomfort.Casey MartinezMurray, Parisa Pinela A

## 2017-04-09 NOTE — Progress Notes (Signed)
Subjective/Complaints: No ankle complaints today. Awake and alert and eating breakfast.  Good appetite Appreciate nursing notes still having problems with continence  ROS- no CP SOB, abd pain, neg N/V Objective: Vital Signs: Blood pressure (!) 113/54, pulse 71, temperature 98.3 F (36.8 C), temperature source Oral, resp. rate 16, height 5\' 1"  (1.549 m), weight 63.4 kg (139 lb 12.4 oz), SpO2 96 %. No results found. Results for orders placed or performed during the hospital encounter of 04/03/17 (from the past 72 hour(s))  Glucose, capillary     Status: Abnormal   Collection Time: 04/06/17  4:33 PM  Result Value Ref Range   Glucose-Capillary 369 (H) 65 - 99 mg/dL  Glucose, capillary     Status: Abnormal   Collection Time: 04/06/17  8:29 PM  Result Value Ref Range   Glucose-Capillary 349 (H) 65 - 99 mg/dL  Glucose, capillary     Status: Abnormal   Collection Time: 04/07/17  6:47 AM  Result Value Ref Range   Glucose-Capillary 248 (H) 65 - 99 mg/dL   Comment 1 Notify RN   Glucose, capillary     Status: Abnormal   Collection Time: 04/07/17 11:50 AM  Result Value Ref Range   Glucose-Capillary 310 (H) 65 - 99 mg/dL  Glucose, capillary     Status: Abnormal   Collection Time: 04/07/17  4:40 PM  Result Value Ref Range   Glucose-Capillary 241 (H) 65 - 99 mg/dL  Glucose, capillary     Status: Abnormal   Collection Time: 04/07/17  9:17 PM  Result Value Ref Range   Glucose-Capillary 229 (H) 65 - 99 mg/dL   Comment 1 Notify RN   Glucose, capillary     Status: Abnormal   Collection Time: 04/08/17  6:36 AM  Result Value Ref Range   Glucose-Capillary 211 (H) 65 - 99 mg/dL   Comment 1 Notify RN   Glucose, capillary     Status: Abnormal   Collection Time: 04/08/17 11:42 AM  Result Value Ref Range   Glucose-Capillary 357 (H) 65 - 99 mg/dL  Glucose, capillary     Status: Abnormal   Collection Time: 04/08/17  4:31 PM  Result Value Ref Range   Glucose-Capillary 221 (H) 65 - 99 mg/dL   Glucose, capillary     Status: Abnormal   Collection Time: 04/08/17 10:04 PM  Result Value Ref Range   Glucose-Capillary 290 (H) 65 - 99 mg/dL   Comment 1 Notify RN   Glucose, capillary     Status: Abnormal   Collection Time: 04/09/17  6:47 AM  Result Value Ref Range   Glucose-Capillary 177 (H) 65 - 99 mg/dL   Comment 1 Notify RN   Glucose, capillary     Status: Abnormal   Collection Time: 04/09/17 11:35 AM  Result Value Ref Range   Glucose-Capillary 378 (H) 65 - 99 mg/dL     HEENT: normal Cardio: irregular and no murmur Resp: CTA B/L and unlabored GI: BS positive and NT, ND Extremity:  Pulses positive and No Edema Skin:   Intact Neuro: Alert/Oriented, Abnormal Sensory oriented to day  but not month or place even with cuing, Abnormal Motor 3- R delt, 4- R triceps, 3- R biceps an grip, Abnormal FMC Ataxic/ dec FMC and Other RLE 3- HF, 4- KE, 3- ADF with pain limitation Musc/Skel:  Other pain over R achilles to palp , no calf swelling or tenderness, pain to palp ~ 15cm above bimalleolar line Gen NAD Reduced sensation in R L5 and  S1 dermatome  Assessment/Plan: 1. Functional deficits secondary to Left pontine infarct which require 3+ hours per day of interdisciplinary therapy in a comprehensive inpatient rehab setting. Physiatrist is providing close team supervision and 24 hour management of active medical problems listed below. Physiatrist and rehab team continue to assess barriers to discharge/monitor patient progress toward functional and medical goals. FIM: Function - Bathing Bathing activity did not occur: N/A Position: Shower Body parts bathed by patient: Right arm, Right lower leg, Left arm, Left lower leg, Chest, Abdomen, Front perineal area, Buttocks, Right upper leg, Left upper leg Body parts bathed by helper: Back Assist Level: Touching or steadying assistance(Pt > 75%)  Function- Upper Body Dressing/Undressing What is the patient wearing?: Pull over shirt/dress Pull  over shirt/dress - Perfomed by patient: Thread/unthread right sleeve, Thread/unthread left sleeve, Put head through opening Pull over shirt/dress - Perfomed by helper: Thread/unthread right sleeve, Thread/unthread left sleeve, Put head through opening, Pull shirt over trunk Assist Level: Touching or steadying assistance(Pt > 75%) Function - Lower Body Dressing/Undressing What is the patient wearing?: Pants, Socks, Shoes Position: Wheelchair/chair at sink Pants- Performed by patient: Pull pants up/down Pants- Performed by helper: Thread/unthread right pants leg, Thread/unthread left pants leg Non-skid slipper socks- Performed by patient: Don/doff left sock Non-skid slipper socks- Performed by helper: Don/doff right sock, Don/doff left sock Socks - Performed by patient: Don/doff left sock, Don/doff right sock Socks - Performed by helper: Don/doff right sock, Don/doff left sock Shoes - Performed by patient: Don/doff left shoe Shoes - Performed by helper: Don/doff right shoe, Don/doff left shoe TED Hose - Performed by helper: Don/doff right TED hose, Don/doff left TED hose Assist for footwear: Maximal assist Assist for lower body dressing: Touching or steadying assistance (Pt > 75%)  Function - Toileting Toileting activity did not occur: No continent bowel/bladder event Toileting steps completed by patient: Adjust clothing prior to toileting, Performs perineal hygiene, Adjust clothing after toileting Toileting steps completed by helper: Adjust clothing after toileting Toileting Assistive Devices: Grab bar or rail Assist level: Touching or steadying assistance (Pt.75%)  Function - Archivist transfer assistive device: Grab bar Assist level to toilet: Moderate assist (Pt 50 - 74%/lift or lower) Assist level from toilet: Moderate assist (Pt 50 - 74%/lift or lower)  Function - Chair/bed transfer Chair/bed transfer method: Stand pivot Chair/bed transfer assist level: Touching or  steadying assistance (Pt > 75%) Chair/bed transfer assistive device: Armrests, Walker  Function - Locomotion: Wheelchair Max wheelchair distance: 50 Assist Level: Moderate assistance (Pt 50 - 74%) Assist Level: Moderate assistance (Pt 50 - 74%) Wheel 150 feet activity did not occur: Safety/medical concerns Turns around,maneuvers to table,bed, and toilet,negotiates 3% grade,maneuvers on rugs and over doorsills: No Function - Locomotion: Ambulation Ambulation activity did not occur: N/A Assistive device: Walker-rolling Max distance: 75 Assist level: Touching or steadying assistance (Pt > 75%) Walk 10 feet activity did not occur: N/A Assist level: Touching or steadying assistance (Pt > 75%) Walk 50 feet with 2 turns activity did not occur: N/A Assist level: Touching or steadying assistance (Pt > 75%) Walk 150 feet activity did not occur: N/A Walk 10 feet on uneven surfaces activity did not occur: N/A  Function - Comprehension Comprehension: Auditory Comprehension assist level: Follows basic conversation/direction with extra time/assistive device  Function - Expression Expression: Verbal Expression assist level: Expresses basic 90% of the time/requires cueing < 10% of the time., Expresses basic needs/ideas: With extra time/assistive device  Function - Social Interaction Social Interaction assist level:  Interacts appropriately with others with medication or extra time (anti-anxiety, antidepressant).  Function - Problem Solving Problem solving assist level: Solves basic 90% of the time/requires cueing < 10% of the time  Function - Memory Memory assist level: Recognizes or recalls 50 - 74% of the time/requires cueing 25 - 49% of the time, Recognizes or recalls 75 - 89% of the time/requires cueing 10 - 24% of the time Patient normally able to recall (first 3 days only): That he or she is in a hospital, Current season  Medical Problem List and Plan: 1.Increased Right-sided  weaknesssecondary to acute infarction left pons as well as history of CVA posterior cerebral artery December 2016 with right-sided residual weakness -CIR PT, OT, SLP evals 2. DVT Prophylaxis/Anticoagulation: Eliquis 3. Pain Management:Lyrica 150 mg daily, Cymbalta 30 mg twice a day,? Diabetic neuropathy- no symmetric numbness  Voltaren gel -right high  ankle no she had a chip on her shoulder she is like she is like my second class citizen is longer myself or see Dr. is sprain-----ice, PRAFO boot for support,Air cast able to bear wt per PT -improving                  4. Mood:Provide emotional support 5. Neuropsych: This patientisnot capable of making decisions on herown behalf. Poor orientation will ask neuropsych to eval 6. Skin/Wound Care:Routine skin checks 7. Fluids/Electrolytes/Nutrition:Routine I&O's with follow-up chemistries 8.Diabetes mellitus and peripheral neuropathy. Hemoglobin A1c 8.6. Levemir 30 units twice a day. Check blood sugars before meals and at bedtime CBG (last 3)  Recent Labs    04/08/17 2204 04/09/17 0647 04/09/17 1135  GLUCAP 290* 177* 378*  Improving with Levemir will increase to 40U BID Will resume low dose metformin 12/3 9.Hypothyroidism. Synthroid 10.Hypertension. Avapro 150 mg daily, Norvasc 5 mg daily, Coreg 6.25 mg twice a day. Vitals:   04/09/17 0332 04/09/17 1422  BP: (!) 163/64 (!) 113/54  Pulse: 65 71  Resp: 16 16  Temp: (!) 97.5 F (36.4 C) 98.3 F (36.8 C)  SpO2: 92% 96%  BPs labile will monitor 11.Hyperlipidemia. Lipitor 12.GERD. Protonix Right   LOS (Days) 6 A FACE TO FACE EVALUATION WAS PERFORMED  Erick Colacendrew E Kirsteins 04/09/2017, 3:34 PM

## 2017-04-09 NOTE — Progress Notes (Signed)
Occupational Therapy Session Note  Patient Details  Name: Casey Harrell MRN: [id number] Date of Birth: Aug 26, 1934  Today's Date: 04/09/2017 OT Individual Time: 1000-1100 OT Individual Time Calculation (min): 60 min    Short Term Goals: Week 1:  OT Short Term Goal 1 (Week 1): Pt will complete 3/3 toileting task with CGA OT Short Term Goal 2 (Week 1): Pt will dress LB with steadying assist OT Short Term Goal 3 (Week 1): Pt will use R UE at dominatnt level during grooming task with min cuing  OT Short Term Goal 4 (Week 1): Pt will utilize hemi dressing technique with min cuing OT Short Term Goal 5 (Week 1): Pt will be oriented to place and situation with min questioning cues  Skilled Therapeutic Interventions/Progress Updates:    Pt seen for OT session focusing on ADL re-training, functional standing balance and functional transfers. Pt sitting up in w/c upon arrival, agreeable to tx session. She declined bathing/dressing session. She completed grooming tasks standing at sink, required VCs for termination of task when brushing teeth and VCs for awareness of turning off water when completed. She automatically used R hand to attempt to brush hair, however, demonstrated difficulty starting the movement. With tactile cues for initiation, pt able to complete task.  In therapy gym, completed functional reaching task from standing position using R UE. Required to reach overhead to obtain horseshoe and place in chair across midline. Guarding assist for balance. Rest breaks required throughout, tolerating ~3-4 minutes before requiring seated  Rest break.  She then completed simulated shower stall transfer. Completed with CGA following demonstration for technique. Completed x2 trials with pt requiring min A to recall technique.  Pt ambulated ~30 ft back towards room at end of session. Left seated in room with family present and all needs in reach.   Therapy Documentation Precautions:   Precautions Precautions: Fall Precaution Comments: R ankle bruising Restrictions Weight Bearing Restrictions: No Pain: Pain Assessment Pain Assessment: 0-10 Pain Score: No/ denies pain     See Function Navigator for Current Functional Status.   Therapy/Group: Individual Therapy  Ioannis Schuh L 04/09/2017, 7:10 AM

## 2017-04-09 NOTE — Progress Notes (Signed)
Speech Language Pathology Daily Session Note  Patient Details  Name: Casey Harrell MRN: [id number] Date of Birth: 1935/02/18  Today's Date: 04/09/2017 SLP Individual Time: 0830-0930 SLP Individual Time Calculation (min): 60 min  Short Term Goals: Week 1: SLP Short Term Goal 1 (Week 1): Pt will utilize external memory aids to recall information with Min A cues.  SLP Short Term Goal 2 (Week 1): Pt will complete mildly complex reasoning problems with Min A cues.  SLP Short Term Goal 3 (Week 1): Pt will demonstrate intellectual awareness by identifying 2 physical and 2 cognitive deficits with Mod A cues.  SLP Short Term Goal 4 (Week 1): Pt will utilize increased vocal intensity and over articulation at the simple conversation level with Min A cues to achieve ~90% intelligibility.   Skilled Therapeutic Interventions: Skilled treatment session focused on cognition goals. SLP facilitiated session by providing more than a reasonable amount of time to identify matching shapes on pair of cards. SLP further facilitated session by providing questions cues for orientation information. Pt required supervision cues to complete pattern replication task. Pt was handed off to nursing.      Function:    Cognition Comprehension Comprehension assist level: Follows basic conversation/direction with extra time/assistive device  Expression   Expression assist level: Expresses basic 90% of the time/requires cueing < 10% of the time.;Expresses basic needs/ideas: With extra time/assistive device  Social Interaction Social Interaction assist level: Interacts appropriately with others with medication or extra time (anti-anxiety, antidepressant).  Problem Solving Problem solving assist level: Solves basic 90% of the time/requires cueing < 10% of the time  Memory Memory assist level: Recognizes or recalls 50 - 74% of the time/requires cueing 25 - 49% of the time;Recognizes or recalls 75 - 89% of the time/requires cueing  10 - 24% of the time    Pain Pain Assessment Pain Assessment: No/denies pain Pain Score: 0-No pain Multiple Pain Sites: No  Therapy/Group: Individual Therapy  Chianti Goh 04/09/2017, 11:13 AM

## 2017-04-10 ENCOUNTER — Inpatient Hospital Stay (HOSPITAL_COMMUNITY): Payer: Medicare Other | Admitting: Physical Therapy

## 2017-04-10 ENCOUNTER — Other Ambulatory Visit: Payer: Self-pay

## 2017-04-10 ENCOUNTER — Inpatient Hospital Stay (HOSPITAL_COMMUNITY): Payer: Medicare Other | Admitting: Occupational Therapy

## 2017-04-10 ENCOUNTER — Inpatient Hospital Stay (HOSPITAL_COMMUNITY): Payer: Medicare Other

## 2017-04-10 LAB — GLUCOSE, CAPILLARY
Glucose-Capillary: 148 mg/dL — ABNORMAL HIGH (ref 65–99)
Glucose-Capillary: 122 mg/dL — ABNORMAL HIGH (ref 65–99)
Glucose-Capillary: 136 mg/dL — ABNORMAL HIGH (ref 65–99)
Glucose-Capillary: 239 mg/dL — ABNORMAL HIGH (ref 65–99)

## 2017-04-10 NOTE — Progress Notes (Signed)
Physical Therapy Session Note  Patient Details  Name: Casey Harrell MRN: [id number] Date of Birth: 24-Jun-1934  Today's Date: 04/10/2017 PT Individual Time: 1130-1155 PT Individual Time Calculation (min): 25 min   Short Term Goals: Week 1:  PT Short Term Goal 1 (Week 1): Pt will perform bed mobility with min assist  PT Short Term Goal 2 (Week 1): Pt will perform sit<>stand with min assist consistently  PT Short Term Goal 3 (Week 1): Pt will performed stand pivot WC with min assist and LRAD  PT Short Term Goal 4 (Week 1): Pt will ambulate 59f with min assist  PT Short Term Goal 5 (Week 1): Pt will propell WC 1045fwith min assist.   Skilled Therapeutic Interventions/Progress Updates:   Pt in w/c upon arrival and agreeable to therapy, no c/o pain. Min assist to don shoes. Ambulated 7538x2 w/ seated rest break in between to work on functional activity and gait training. Min guard for gait w/ Mod assist to correct 1 LOB w/ RW. Verbal cues for increased R step length, R foot clearance, and upright posture. Ended session in w/c w/ quick release belt on, call bell within reach and all needs met.   Therapy Documentation Precautions:  Precautions Precautions: Fall Precaution Comments: R ankle bruising Restrictions Weight Bearing Restrictions: No Pain: Pain Assessment Pain Assessment: No/denies pain Pain Score: 3   See Function Navigator for Current Functional Status.   Therapy/Group: Individual Therapy  Brelee Renk K Arnette 04/10/2017, 12:39 PM

## 2017-04-10 NOTE — Progress Notes (Signed)
Physical Therapy Session Note  Patient Details  Name: Casey Harrell MRN: [id number] Date of Birth: 07-Sep-1934  Today's Date: 04/10/2017 PT Individual Time: 1300-1410 PT Individual Time Calculation (min): 70 min   Short Term Goals: Week 1:  PT Short Term Goal 1 (Week 1): Pt will perform bed mobility with min assist  PT Short Term Goal 2 (Week 1): Pt will perform sit<>stand with min assist consistently  PT Short Term Goal 3 (Week 1): Pt will performed stand pivot WC with min assist and LRAD  PT Short Term Goal 4 (Week 1): Pt will ambulate 4330ft with min assist  PT Short Term Goal 5 (Week 1): Pt will propell WC 18200ft with min assist.   Skilled Therapeutic Interventions/Progress Updates: Pt presented in w/c with dgt present agreeable to therapy. Pt propelled 14500ft with w/c using x 4 extremities and mod cues for increased use of RUE. Performed stand pivot w/c to mat min guard and participated in standing balance activities including reaching/placing horse shoes onto basketball hoop, also done with single LE on 4in step for increasing R bias. Performed alternating toe taps on 4in step for coordination and pt able to increase tapping speed with cues. Pt ambulated 3895ft min guard with decreased R foot clearance with fatigue. Pt transported to rehab gym and performed NuStep L1 x 5 min for endurance and reciprocal activity. Pt transported back to room and returned to room and requested to use bathroom. Performed toilet transfers with min guard and required minA for pulling up elastic band around waist. Pt transferred back to bed and performed sit to supine at flat bed with supervision and increased time. Pt left with bed alarm on and family present.      Therapy Documentation Precautions:  Precautions Precautions: Fall Precaution Comments: R ankle bruising Restrictions Weight Bearing Restrictions: No   See Function Navigator for Current Functional Status.   Therapy/Group: Individual Therapy  Elsie Baynes  Jacqulyn Barresi, PTA  04/10/2017, 2:29 PM

## 2017-04-10 NOTE — Progress Notes (Signed)
Speech Language Pathology Daily Session Note  Patient Details  Name: Casey Harrell MRN: [id number] Date of Birth: 1935-03-03  Today's Date: 04/10/2017 SLP Individual Time: 0900-1000 SLP Individual Time Calculation (min): 60 min  Short Term Goals: Week 1: SLP Short Term Goal 1 (Week 1): Pt will utilize external memory aids to recall information with Min A cues.  SLP Short Term Goal 2 (Week 1): Pt will complete mildly complex reasoning problems with Min A cues.  SLP Short Term Goal 3 (Week 1): Pt will demonstrate intellectual awareness by identifying 2 physical and 2 cognitive deficits with Mod A cues.  SLP Short Term Goal 4 (Week 1): Pt will utilize increased vocal intensity and over articulation at the simple conversation level with Min A cues to achieve ~90% intelligibility.   Skilled Therapeutic Interventions: Skilled ST services focused on cognitive skills. Pt demonstrated ability to identify 1 physical impairment independently and required min A verbal cues to identify 2nd physical impairment and 2 cognitive impairments.Pt demonstrated self correction of awareness in impairment stating " they say it is not safe for me to get up and walk around, wait no it is not safe for me to get up on my own." SLP educated in the importance of awareness in impairments in order to process to monitoring/ correcting errors, pt stated understanding. SLP facilitated basic problem solving utilizing 3 step sequence cards pt demonstrated Mod I level, identifying 1 problem in picture cards with 70% accuracy given Min A verbal cues 100% accuracy and simplistic version of cards game (blink) requiring mod-max A verbal/visual cues due to reduced attention/working memory and ridge flexibility in learning.  Pt demonstrated increase in recall ability,recalling rules to game, however demonstrated difficulty with application in problem solving and orientation time/place with supervision A question cues utilizing visual aid. Pt was  left in room with call bell within reach. Recommend to continue skilled ST services.     Function:  Eating Eating     Eating Assist Level: Set up assist for   Eating Set Up Assist For: Opening containers       Cognition Comprehension Comprehension assist level: Follows basic conversation/direction with no assist  Expression   Expression assist level: Expresses basic needs/ideas: With no assist  Social Interaction Social Interaction assist level: Interacts appropriately 90% of the time - Needs monitoring or encouragement for participation or interaction.  Problem Solving Problem solving assist level: Solves basic 50 - 74% of the time/requires cueing 25 - 49% of the time  Memory Memory assist level: Recognizes or recalls 75 - 89% of the time/requires cueing 10 - 24% of the time    Pain Pain Assessment Pain Assessment: No/denies pain  Therapy/Group: Individual Therapy  Dilia Alemany  [facility] LLCCRATCH 04/10/2017, 10:11 AM

## 2017-04-10 NOTE — Progress Notes (Signed)
Subjective/Complaints:  No issues overnite, discussed bladder, has sensation but has urgency not enough time to get to commode.  No pain with urination, bowels are doing ok  ROS- no CP SOB, abd pain, neg N/V Objective: Vital Signs: Blood pressure 122/71, pulse 67, temperature 98.1 F (36.7 C), temperature source Oral, resp. rate 18, height 5\' 1"  (1.549 m), weight 63.4 kg (139 lb 12.4 oz), SpO2 99 %. No results found. Results for orders placed or performed during the hospital encounter of 04/03/17 (from the past 72 hour(s))  Glucose, capillary     Status: Abnormal   Collection Time: 04/07/17 11:50 AM  Result Value Ref Range   Glucose-Capillary 310 (H) 65 - 99 mg/dL  Glucose, capillary     Status: Abnormal   Collection Time: 04/07/17  4:40 PM  Result Value Ref Range   Glucose-Capillary 241 (H) 65 - 99 mg/dL  Glucose, capillary     Status: Abnormal   Collection Time: 04/07/17  9:17 PM  Result Value Ref Range   Glucose-Capillary 229 (H) 65 - 99 mg/dL   Comment 1 Notify RN   Glucose, capillary     Status: Abnormal   Collection Time: 04/08/17  6:36 AM  Result Value Ref Range   Glucose-Capillary 211 (H) 65 - 99 mg/dL   Comment 1 Notify RN   Glucose, capillary     Status: Abnormal   Collection Time: 04/08/17 11:42 AM  Result Value Ref Range   Glucose-Capillary 357 (H) 65 - 99 mg/dL  Glucose, capillary     Status: Abnormal   Collection Time: 04/08/17  4:31 PM  Result Value Ref Range   Glucose-Capillary 221 (H) 65 - 99 mg/dL  Glucose, capillary     Status: Abnormal   Collection Time: 04/08/17 10:04 PM  Result Value Ref Range   Glucose-Capillary 290 (H) 65 - 99 mg/dL   Comment 1 Notify RN   Glucose, capillary     Status: Abnormal   Collection Time: 04/09/17  6:47 AM  Result Value Ref Range   Glucose-Capillary 177 (H) 65 - 99 mg/dL   Comment 1 Notify RN   Glucose, capillary     Status: Abnormal   Collection Time: 04/09/17 11:35 AM  Result Value Ref Range   Glucose-Capillary  378 (H) 65 - 99 mg/dL  Glucose, capillary     Status: Abnormal   Collection Time: 04/09/17  4:44 PM  Result Value Ref Range   Glucose-Capillary 207 (H) 65 - 99 mg/dL  Glucose, capillary     Status: None   Collection Time: 04/09/17  4:45 PM  Result Value Ref Range   Glucose-Capillary 97 65 - 99 mg/dL  Glucose, capillary     Status: Abnormal   Collection Time: 04/09/17  8:49 PM  Result Value Ref Range   Glucose-Capillary 350 (H) 65 - 99 mg/dL  Glucose, capillary     Status: Abnormal   Collection Time: 04/10/17  7:02 AM  Result Value Ref Range   Glucose-Capillary 148 (H) 65 - 99 mg/dL     HEENT: normal Cardio: irregular and no murmur Resp: CTA B/L and unlabored GI: BS positive and NT, ND Extremity:  Pulses positive and No Edema Skin:   Intact Neuro: Alert/Oriented, Abnormal Sensory oriented to day  but not month or place even with cuing, Abnormal Motor 3- R delt, 4- R triceps, 3- R biceps an grip, Abnormal FMC Ataxic/ dec FMC and Other RLE 3- HF, 4- KE, 3- ADF with pain limitation Musc/Skel:  Other  pain over R achilles to palp , no calf swelling or tenderness, pain to palp ~ 15cm above bimalleolar line Gen NAD Reduced sensation in R L5 and S1 dermatome  Assessment/Plan: 1. Functional deficits secondary to Left pontine infarct which require 3+ hours per day of interdisciplinary therapy in a comprehensive inpatient rehab setting. Physiatrist is providing close team supervision and 24 hour management of active medical problems listed below. Physiatrist and rehab team continue to assess barriers to discharge/monitor patient progress toward functional and medical goals. FIM: Function - Bathing Bathing activity did not occur: N/A Position: Shower Body parts bathed by patient: Right arm, Right lower leg, Left arm, Left lower leg, Chest, Abdomen, Front perineal area, Buttocks, Right upper leg, Left upper leg Body parts bathed by helper: Back Assist Level: Touching or steadying  assistance(Pt > 75%)  Function- Upper Body Dressing/Undressing What is the patient wearing?: Pull over shirt/dress Pull over shirt/dress - Perfomed by patient: Thread/unthread right sleeve, Thread/unthread left sleeve, Put head through opening Pull over shirt/dress - Perfomed by helper: Thread/unthread right sleeve, Thread/unthread left sleeve, Put head through opening, Pull shirt over trunk Assist Level: Touching or steadying assistance(Pt > 75%) Function - Lower Body Dressing/Undressing What is the patient wearing?: Pants, Socks, Shoes Position: Wheelchair/chair at sink Pants- Performed by patient: Pull pants up/down Pants- Performed by helper: Thread/unthread right pants leg, Thread/unthread left pants leg Non-skid slipper socks- Performed by patient: Don/doff left sock Non-skid slipper socks- Performed by helper: Don/doff right sock, Don/doff left sock Socks - Performed by patient: Don/doff left sock, Don/doff right sock Socks - Performed by helper: Don/doff right sock, Don/doff left sock Shoes - Performed by patient: Don/doff left shoe Shoes - Performed by helper: Don/doff right shoe, Don/doff left shoe TED Hose - Performed by helper: Don/doff right TED hose, Don/doff left TED hose Assist for footwear: Maximal assist Assist for lower body dressing: Touching or steadying assistance (Pt > 75%)  Function - Toileting Toileting activity did not occur: No continent bowel/bladder event Toileting steps completed by patient: Adjust clothing prior to toileting, Performs perineal hygiene, Adjust clothing after toileting Toileting steps completed by helper: Adjust clothing after toileting Toileting Assistive Devices: Grab bar or rail Assist level: Touching or steadying assistance (Pt.75%)  Function - Archivist transfer assistive device: Grab bar Assist level to toilet: Moderate assist (Pt 50 - 74%/lift or lower) Assist level from toilet: Moderate assist (Pt 50 - 74%/lift or  lower)  Function - Chair/bed transfer Chair/bed transfer method: Stand pivot Chair/bed transfer assist level: Touching or steadying assistance (Pt > 75%) Chair/bed transfer assistive device: Armrests, Walker  Function - Locomotion: Wheelchair Max wheelchair distance: 50 Assist Level: Moderate assistance (Pt 50 - 74%) Assist Level: Moderate assistance (Pt 50 - 74%) Wheel 150 feet activity did not occur: Safety/medical concerns Turns around,maneuvers to table,bed, and toilet,negotiates 3% grade,maneuvers on rugs and over doorsills: No Function - Locomotion: Ambulation Ambulation activity did not occur: N/A Assistive device: Walker-rolling Max distance: 75 Assist level: Touching or steadying assistance (Pt > 75%) Walk 10 feet activity did not occur: N/A Assist level: Touching or steadying assistance (Pt > 75%) Walk 50 feet with 2 turns activity did not occur: N/A Assist level: Touching or steadying assistance (Pt > 75%) Walk 150 feet activity did not occur: N/A Walk 10 feet on uneven surfaces activity did not occur: N/A  Function - Comprehension Comprehension: Auditory Comprehension assist level: Follows basic conversation/direction with extra time/assistive device  Function - Expression Expression: Verbal Expression assist level:  Expresses basic 90% of the time/requires cueing < 10% of the time., Expresses basic needs/ideas: With extra time/assistive device  Function - Social Interaction Social Interaction assist level: Interacts appropriately with others with medication or extra time (anti-anxiety, antidepressant).  Function - Problem Solving Problem solving assist level: Solves basic 90% of the time/requires cueing < 10% of the time  Function - Memory Memory assist level: Recognizes or recalls 50 - 74% of the time/requires cueing 25 - 49% of the time, Recognizes or recalls 75 - 89% of the time/requires cueing 10 - 24% of the time Patient normally able to recall (first 3 days  only): That he or she is in a hospital, Current season  Medical Problem List and Plan: 1.Increased Right-sided weaknesssecondary to acute infarction left pons as well as history of CVA posterior cerebral artery December 2016 with right-sided residual weakness -CIR PT, OT, SLP team conf in am 2. DVT Prophylaxis/Anticoagulation: Eliquis 3. Pain Management:Lyrica 150 mg daily, Cymbalta 30 mg twice a day,? Diabetic neuropathy- no symmetric numbness  Voltaren gel -right high  ankle no she had a chip on her shoulder she is like she is like my second class citizen is longer myself or see Dr. is sprain-----ice, PRAFO boot for support,Air cast able to bear wt per PT -improving                  4. Mood:Provide emotional support 5. Neuropsych: This patientisnot capable of making decisions on herown behalf. Poor orientation will ask neuropsych to eval 6. Skin/Wound Care:Routine skin checks 7. Fluids/Electrolytes/Nutrition:Routine I&O's with follow-up chemistries 8.Diabetes mellitus and peripheral neuropathy. Hemoglobin A1c 8.6. Levemir 30 units twice a day. Check blood sugars before meals and at bedtime CBG (last 3)  Recent Labs    04/09/17 1645 04/09/17 2049 04/10/17 0702  GLUCAP 97 350* 148*  Improving with Levemir  40U BID  resumed low dose metformin 12/3 CBGs improving but fluctuating in a wide range, cont to monitor 9.Hypothyroidism. Synthroid 10.Hypertension. Avapro 150 mg daily, Norvasc 5 mg daily, Coreg 6.25 mg twice a day. Vitals:   04/09/17 1422 04/10/17 0200  BP: (!) 113/54 122/71  Pulse: 71 67  Resp: 16 18  Temp: 98.3 F (36.8 C) 98.1 F (36.7 C)  SpO2: 96% 99%  BPs controlled 12/4 11.Hyperlipidemia. Lipitor 12.GERD. Protonix Right   LOS (Days) 7 A FACE TO FACE EVALUATION WAS PERFORMED  Erick Colacendrew E Kirsteins 04/10/2017, 8:02 AM

## 2017-04-10 NOTE — Progress Notes (Signed)
Occupational Therapy Session Note  Patient Details  Name: Casey Harrell MRN: [id number] Date of Birth: [birth year]  Today's Date: 04/10/2017 OT Individual Time: 1418-1530 OT Individual Time Calculation (min): 72 min    Short Term Goals: Week 1:  OT Short Term Goal 1 (Week 1): Pt will complete 3/3 toileting task with CGA OT Short Term Goal 2 (Week 1): Pt will dress LB with steadying assist OT Short Term Goal 3 (Week 1): Pt will use R UE at dominatnt level during grooming task with min cuing  OT Short Term Goal 4 (Week 1): Pt will utilize hemi dressing technique with min cuing OT Short Term Goal 5 (Week 1): Pt will be oriented to place and situation with min questioning cues  Skilled Therapeutic Interventions/Progress Updates:   Pt completed donning shoes sitting EOB with supervision to start session.  She then ambulated down to the dayroom with min assist using the RW.  After sitting down on the Nustep to rest, she attempted to walk down to the UE ergonometer with the RW for use.  After taking 4-5 steps her RLE began to buckle and therapist needed to assist her with sitting down in a chair.  She reported feeling fatigued, so HR taken at 59 with O2 at 98% on room air.  Use wheelchair for transition over to the Ergonometer.  She completed 3 sets of 4 mins each on the ergonometer.  First set was with both UEs.  Second and third sets were with isolated use of the RUE.  She needed re-gripping on the right side several times to complete these.  Intensity set on level 1 for all sets.  Next had pt transition to the high/low table for work on RUE reach and FM coordination while standing.  Had pt work on Radio producerpreparing checker board for game.  Mod demonstrational cueing needed for pt to remember to use the RUE only and for directions to setting up the board.  She needed brief rest break after setup and then she stood to play.  She was able to maintain standing for 7-8 mins while engaged in RUE functional use before  needing to sit secondary to the RLE buckling and getting fatigued.  Finished session with return to the room via wheelchair with family present and call button and phone in reach.  Pt left up in wheelchair without safety belt in place as family was going to stay with her.       Therapy Documentation Precautions:  Precautions Precautions: Fall Precaution Comments: R ankle bruising Restrictions Weight Bearing Restrictions: No  Pain: Pain Assessment Pain Assessment: No/denies pain ADL: See Function Navigator for Current Functional Status.   Therapy/Group: Individual Therapy  Ilean Spradlin OTR/L 04/10/2017, 4:26 PM

## 2017-04-11 ENCOUNTER — Other Ambulatory Visit: Payer: Self-pay

## 2017-04-11 ENCOUNTER — Inpatient Hospital Stay (HOSPITAL_COMMUNITY): Payer: Medicare Other | Admitting: Physical Therapy

## 2017-04-11 ENCOUNTER — Inpatient Hospital Stay (HOSPITAL_COMMUNITY): Payer: Medicare Other | Admitting: Occupational Therapy

## 2017-04-11 ENCOUNTER — Inpatient Hospital Stay (HOSPITAL_COMMUNITY): Payer: Medicare Other

## 2017-04-11 LAB — GLUCOSE, CAPILLARY
Glucose-Capillary: 149 mg/dL — ABNORMAL HIGH (ref 65–99)
Glucose-Capillary: 189 mg/dL — ABNORMAL HIGH (ref 65–99)
Glucose-Capillary: 194 mg/dL — ABNORMAL HIGH (ref 65–99)
Glucose-Capillary: 158 mg/dL — ABNORMAL HIGH (ref 65–99)

## 2017-04-11 MED ORDER — METFORMIN HCL 500 MG PO TABS
250.0000 mg | ORAL_TABLET | Freq: Two times a day (BID) | ORAL | Status: DC
  Administered 2017-04-11 – 2017-04-14 (×6): 250 mg via ORAL
  Filled 2017-04-11 (×7): qty 1

## 2017-04-11 NOTE — Progress Notes (Signed)
Physical Therapy Session Note  Patient Details  Name: Casey Harrell MRN: [id number] Date of Birth: 21-Jun-1934  Today's Date: 04/11/2017 PT Individual Time: 1300-1400 PT Individual Time Calculation (min): 60 min   Short Term Goals: Week 1:  PT Short Term Goal 1 (Week 1): Pt will perform bed mobility with min assist  PT Short Term Goal 1 - Progress (Week 1): Met PT Short Term Goal 2 (Week 1): Pt will perform sit<>stand with min assist consistently  PT Short Term Goal 2 - Progress (Week 1): Met PT Short Term Goal 3 (Week 1): Pt will performed stand pivot WC with min assist and LRAD  PT Short Term Goal 3 - Progress (Week 1): Met PT Short Term Goal 4 (Week 1): Pt will ambulate 13f with min assist  PT Short Term Goal 4 - Progress (Week 1): Met PT Short Term Goal 5 (Week 1): Pt will propell WC 1014fwith min assist.  Week 2:  PT Short Term Goal 1 (Week 2): STG =LTG due to ELOS   Skilled Therapeutic Interventions/Progress Updates:   Pt received sitting in WC and agreeable to PT. [facility] with RW, 14040fith close supervision assist. Only min cues required from PT for attention to task as well as increased step height on the R to prevent foot drag.    Pt instructed pt in stair negotiation 3bouts x 4 steps (6") and 1 rail in the LUE. Min assist overall provided by PT with moderate cues for step to gait pattern, ascending with the LLE and descending with the RLE. Pt noted to have one instance of R knee instability requiring mod assist to block the knee and prevent fall.   Nustep reciprocal movement training x 10 minutes, level 4. Intermittent cues from PT for full ROM and increased speed to improve strengthening aspect of exercise and to improve symmetry.   Throughout treatment, pt performed all sit<>stand and stand pivot transfers with supervision assist and min cues for safety and proper use of RW.   Patient returned to room and left  [facility] and all needs met.            Therapy Documentation Precautions:  Precautions Precautions: Fall Precaution Comments: R ankle bruising Restrictions Weight Bearing Restrictions: No Pain: 0/10   See Function Navigator for Current Functional Status.   Therapy/Group: Individual Therapy  AusLorie Phenix/09/2016, 2:00 PM

## 2017-04-11 NOTE — Progress Notes (Signed)
Speech Language Pathology Weekly Progress and Session Note  Patient Details  Name: Casey Harrell MRN: [id number] Date of Birth: [birth year]  Beginning of progress report period: April 04, 2017 End of progress report period: April 11, 2017  Today's Date: 04/11/2017 SLP Individual Time: 1400-1430 SLP Individual Time Calculation (min): 30 min  Short Term Goals: Week 1: SLP Short Term Goal 1 (Week 1): Pt will utilize external memory aids to recall information with Min A cues.  SLP Short Term Goal 1 - Progress (Week 1): Met SLP Short Term Goal 2 (Week 1): Pt will complete mildly complex reasoning problems with Min A cues.  SLP Short Term Goal 2 - Progress (Week 1): Not met SLP Short Term Goal 3 (Week 1): Pt will demonstrate intellectual awareness by identifying 2 physical and 2 cognitive deficits with Mod A cues.  SLP Short Term Goal 3 - Progress (Week 1): Met SLP Short Term Goal 4 (Week 1): Pt will utilize increased vocal intensity and over articulation at the simple conversation level with Min A cues to achieve ~90% intelligibility.  SLP Short Term Goal 4 - Progress (Week 1): Met    New Short Term Goals: Week 2: SLP Short Term Goal 1 (Week 2): STG=LTG due to ELOS  Weekly Progress Updates: Pt demonstrated ability to met 3 out of 4 short term goals, demonstrating mod-min impairment in recall strategies, mildly complex problem solving and intellectual awareness, which is further impacted by impairment is selective attention. Pt's long term goals are downgraded to Min A due to ELOS and fluctuating problem solving, recall and attention impairment. Pt continues to benefit from skilled ST services in order to maximize functional independence and reduce burden of care requiring 24 supervision assistance.      Intensity: Minumum of 1-2 x/day, 30 to 90 minutes Frequency: 3 to 5 out of 7 days Duration/Length of Stay: 12/8 Treatment/Interventions: Functional tasks;Patient/family  education;Cognitive remediation/compensation;Internal/external aids   Daily Session  Skilled Therapeutic Interventions: Skilled ST services focused on cognitive skills. SLP facilitated orientation to time/place with min a verbal cues and intellectual awareness with min a verbal cues. SLP facilitated 4 step sequencing task to address problem solving, pt required min-mod A verbal cues and Mod A verbal cues to monitor/correct errors.Pt stated issues with memory are her main issue, SLP educated pt on strategies to aid in recall including written reminders ,notes and calendar. SLP explained how pt's impairment in attention are impacting her memory during as task and strategies to remain focused, pt agreed.Pt demonstrated recall of ELOS date and how to use call bell with min a verbal cues. Pt was left in room with call bell within reach. Recommend to continue skilled ST services.     Function:   Eating Eating     Eating Assist Level: Set up assist for   Eating Set Up Assist For: Opening containers       Cognition Comprehension Comprehension assist level: Follows basic conversation/direction with no assist  Expression   Expression assist level: Expresses basic needs/ideas: With no assist  Social Interaction Social Interaction assist level: Interacts appropriately 90% of the time - Needs monitoring or encouragement for participation or interaction.  Problem Solving Problem solving assist level: Solves basic 50 - 74% of the time/requires cueing 25 - 49% of the time;Solves basic 75 - 89% of the time/requires cueing 10 - 24% of the time  Memory Memory assist level: Recognizes or recalls 75 - 89% of the time/requires cueing 10 - 24% of the  Speech Language Pathology Weekly Progress and Session Note  Patient Details  Name: Casey Harrell MRN: [id number] Date of Birth: [birth year]  Beginning of progress report period: April 04, 2017 End of progress report period: April 11, 2017  Today's Date: 04/11/2017 SLP Individual Time: 1400-1430 SLP Individual Time Calculation (min): 30 min  Short Term Goals: Week 1: SLP Short Term Goal 1 (Week 1): Pt will utilize external memory aids to recall information with Min A cues.  SLP Short Term Goal 1 - Progress (Week 1): Met SLP Short Term Goal 2 (Week 1): Pt will complete mildly complex reasoning problems with Min A cues.  SLP Short Term Goal 2 - Progress (Week 1): Not met SLP Short Term Goal 3 (Week 1): Pt will demonstrate intellectual awareness by identifying 2 physical and 2 cognitive deficits with Mod A cues.  SLP Short Term Goal 3 - Progress (Week 1): Met SLP Short Term Goal 4 (Week 1): Pt will utilize increased vocal intensity and over articulation at the simple conversation level with Min A cues to achieve ~90% intelligibility.  SLP Short Term Goal 4 - Progress (Week 1): Met    New Short Term Goals: Week 2: SLP Short Term Goal 1 (Week 2): STG=LTG due to ELOS  Weekly Progress Updates: Pt demonstrated ability to met 3 out of 4 short term goals, demonstrating mod-min impairment in recall strategies, mildly complex problem solving and intellectual awareness, which is further impacted by impairment is selective attention. Pt's long term goals are downgraded to Min A due to ELOS and fluctuating problem solving, recall and attention impairment. Pt continues to benefit from skilled ST services in order to maximize functional independence and reduce burden of care requiring 24 supervision assistance.      Intensity: Minumum of 1-2 x/day, 30 to 90 minutes Frequency: 3 to 5 out of 7 days Duration/Length of Stay: 12/8 Treatment/Interventions: Functional tasks;Patient/family  education;Cognitive remediation/compensation;Internal/external aids   Daily Session  Skilled Therapeutic Interventions: Skilled ST services focused on cognitive skills. SLP facilitated orientation to time/place with min a verbal cues and intellectual awareness with min a verbal cues. SLP facilitated 4 step sequencing task to address problem solving, pt required min-mod A verbal cues and Mod A verbal cues to monitor/correct errors.Pt stated issues with memory are her main issue, SLP educated pt on strategies to aid in recall including written reminders ,notes and calendar. SLP explained how pt's impairment in attention are impacting her memory during as task and strategies to remain focused, pt agreed.Pt demonstrated recall of ELOS date and how to use call bell with min a verbal cues. Pt was left in room with call bell within reach. Recommend to continue skilled ST services.     Function:   Eating Eating     Eating Assist Level: Set up assist for   Eating Set Up Assist For: Opening containers       Cognition Comprehension Comprehension assist level: Follows basic conversation/direction with no assist  Expression   Expression assist level: Expresses basic needs/ideas: With no assist  Social Interaction Social Interaction assist level: Interacts appropriately 90% of the time - Needs monitoring or encouragement for participation or interaction.  Problem Solving Problem solving assist level: Solves basic 50 - 74% of the time/requires cueing 25 - 49% of the time;Solves basic 75 - 89% of the time/requires cueing 10 - 24% of the time  Memory Memory assist level: Recognizes or recalls 75 - 89% of the time/requires cueing 10 - 24% of the

## 2017-04-11 NOTE — Patient Care Conference (Signed)
Inpatient RehabilitationTeam Conference and Plan of Care Update Date: 04/11/2017   Time: 10:40 AM    Patient Name: Casey Harrell      Medical Record Number: [id number]  Date of Birth: [birth year] Sex: Female         Room/Bed: 4W20C/4W20C-01 Payor Info: Payor: [employer]vertising copywriter MEDICARE / Plan: [employer] MEDICARE / Product Type: *No Product type* /    Admitting Diagnosis: Hyperglycdemic  Admit Date/Time:  04/03/2017  5:06 PM Admission Comments: No comment available   Primary Diagnosis:  <principal problem not specified> Principal Problem: <principal problem not specified>  Patient Active Problem List   Diagnosis Date Noted  . Left pontine cerebrovascular accident (HCC) 04/03/2017  . History of CVA with residual deficit   . Type 2 diabetes mellitus with peripheral neuropathy (HCC)   . Anemia 03/30/2017  . History of stroke   . Chest pain 02/09/2016  . BPPV (benign paroxysmal positional vertigo) 08/25/2015  . HLD (hyperlipidemia) 08/25/2015  . At high risk for falls 07/01/2015  . Vertigo 06/21/2015  . Paroxysmal atrial fibrillation (HCC) 06/11/2015  . Cerebral infarction due to stenosis of left middle cerebral artery (HCC) 05/20/2015  . Intracranial vascular stenosis 05/20/2015  . Type 2 diabetes mellitus with circulatory disorder (HCC) 05/20/2015  . Type 2 diabetes, uncontrolled, with neuropathy (HCC)   . Abdominal pain   . Partial small bowel obstruction (HCC)   . Benign essential HTN   . Small bowel obstruction (HCC) 05/07/2015  . Hemiplegia, unspecified affecting right dominant side (HCC) 04/08/2015  . Embolic stroke involving posterior cerebral artery (HCC) 04/05/2015  . Orthostatic hypotension   . Hypertensive urgency 04/01/2015  . Fever in adult 04/01/2015  . Acute encephalopathy 04/01/2015  . Confusion   . CVA (cerebral infarction)   . Posterior circulation stroke (HCC)   . Acute ischemic stroke (HCC) 03/31/2015  . Stroke (HCC) 03/31/2015  . CKD (chronic kidney disease)  stage 3, GFR 30-59 ml/min (HCC) 04/17/2014  . Gastroenteritis, non-infectious 04/11/2014  . Nausea vomiting and diarrhea 04/10/2014  . Hypokalemia 04/10/2014  . Dehydration 04/09/2014  . Bronchitis with airway obstruction (HCC) 02/03/2013  . Leukocytosis 02/03/2013  . Fever 02/03/2013  . Acute respiratory failure with hypoxia (HCC) 02/03/2013  . Depression 02/03/2013  . Hyponatremia 02/03/2013  . Obesity (BMI 30-39.9) 01/10/2013  . PVD (peripheral vascular disease)- S/P RCE 06/18/09 12/13/2012  . Sleep apnea- C-pap intol 12/13/2012  . Low risk cardiac nuclear stress test- 2009 and Feb 2011 12/13/2012  . Mild persistent asthma 11/29/2011  . Chronic cough 06/05/2011  . Diabetic neuropathy (HCC) 09/02/2009  . Dyslipidemia 07/13/2009  . Hypothyroidism 11/12/2008  . Type 2 diabetes mellitus, uncontrolled (HCC) 07/15/2008  . Essential hypertension 07/15/2008  . GERD 07/15/2008  . DIVERTICULOSIS, COLON 07/15/2008    Expected Discharge Date: Expected Discharge Date: 04/14/17  Team Members Present: Physician leading conference: Dr. Claudette Laws Social Worker Present: Dossie Der, LCSW Nurse Present: Tennis Must, RN PT Present: Harless Litten, PTA;Grier Rocher, PT OT Present: Johnsie Cancel, OT SLP Present: Colin Benton, SLP PPS Coordinator present : Tora Duck, RN, CRRN     Current Status/Progress Goal Weekly Team Focus  Medical   RIght hemiparesis, cognitive issues fluctuate  reduce risk of medical complications  D/C planning   Bowel/Bladder   continent of bowel, incontinent of bladder at times  Keep patient clean and dry.  Monitor & assist as needed   Swallow/Nutrition/ Hydration             ADL's  min assist for LB selfcare and functional transfers using the RW.  Supervision to min assist for UB bathing and dressing.  Uses the RUE as an active assist with increased time but still with decreased gross   Supervision overall, min A shower transfers  selfcare retraining,  balance retraining, transfer training, neuromuscular re-education, pt/family education   Mobility   Supervision bed mobility, supervision transfers, min guard gait with RW up to 85ft  Supervision assist overall for gait, transfers with LRAD   strengtheing, balance, safety awareness, endurancec   Communication   Min A  Supervision  intellgibility in conversation   Safety/Cognition/ Behavioral Observations  Mod - Min A, sustained attention impacting problem solving   Supervision  sustained/selective attention, emergent awareness, recall, basic-semi-complex problem solving, orientation   Pain   No c/o pain/ has lyrica 150mg  daily & voltaren gel QID, tylenol 650mg  prn  Keep pain level below a 3.  continue to assess & treat as needed   Skin   no skin issues  Maintain skin integrity.  continue to assess q shift      *See Care Plan and progress notes for long and short-term goals.     Barriers to Discharge  Current Status/Progress Possible Resolutions Date Resolved   Physician    Decreased caregiver support;Incontinence  Husband with CA and cognitive issues  progressing toward goals  cont rehab, SW to assess caregiver availability      Nursing  Incontinence               PT                    OT                  SLP                SW                Discharge Planning/Teaching Needs:  Family planning on taking pt home arranging 24 hr care as did before with last rehab admission      Team Discussion:  Goals overall supervision-with some min with tub transfers. BS controlled now and stable. Has cognition and attention issues from tow strokes. Will need 24 hr supervision at discharge. Meeting goals quickly. Son and daughter have been in for therapies, aware of supervision need.  Revisions to Treatment Plan:  DC 12/8    Continued Need for Acute Rehabilitation Level of Care: The patient requires daily medical management by a physician with specialized training in physical medicine and  rehabilitation for the following conditions: Daily direction of a multidisciplinary physical rehabilitation program to ensure safe treatment while eliciting the highest outcome that is of practical value to the patient.: Yes Daily medical management of patient stability for increased activity during participation in an intensive rehabilitation regime.: Yes Daily analysis of laboratory values and/or radiology reports with any subsequent need for medication adjustment of medical intervention for : Neurological problems;Other  Cristian Davitt, Lemar Livings 04/11/2017, 1:20 PM

## 2017-04-11 NOTE — Progress Notes (Signed)
Social Work Patient ID: Casey Harrell, female   DOB: 03-Sep-1934, 82 y.o.   MRN: [id number]  Met with pt and husband in the room for team conference goals supervision level and target discharge 12/8. Both requested for worker to contact son to make him aware of the plan. He reports they will have someone with both of them. She has equipment from past admit and will make referral to [facility] who she had before and requests. Will work toward discharge Sat.

## 2017-04-11 NOTE — Progress Notes (Signed)
Subjective/Complaints:  No issues overnite, seen with therapy , working on sit to stand with Sup/SBA  ROS- no CP SOB, abd pain, neg N/V Objective: Vital Signs: Blood pressure (!) 141/73, pulse 80, temperature (!) 97.5 F (36.4 C), temperature source Oral, resp. rate 18, height 5' 1" (1.549 m), weight 64 kg (141 lb 1.5 oz), SpO2 98 %. No results found. Results for orders placed or performed during the hospital encounter of 04/03/17 (from the past 72 hour(s))  Glucose, capillary     Status: Abnormal   Collection Time: 04/08/17 11:42 AM  Result Value Ref Range   Glucose-Capillary 357 (H) 65 - 99 mg/dL  Glucose, capillary     Status: Abnormal   Collection Time: 04/08/17  4:31 PM  Result Value Ref Range   Glucose-Capillary 221 (H) 65 - 99 mg/dL  Glucose, capillary     Status: Abnormal   Collection Time: 04/08/17 10:04 PM  Result Value Ref Range   Glucose-Capillary 290 (H) 65 - 99 mg/dL   Comment 1 Notify RN   Glucose, capillary     Status: Abnormal   Collection Time: 04/09/17  6:47 AM  Result Value Ref Range   Glucose-Capillary 177 (H) 65 - 99 mg/dL   Comment 1 Notify RN   Glucose, capillary     Status: Abnormal   Collection Time: 04/09/17 11:35 AM  Result Value Ref Range   Glucose-Capillary 378 (H) 65 - 99 mg/dL  Glucose, capillary     Status: Abnormal   Collection Time: 04/09/17  4:44 PM  Result Value Ref Range   Glucose-Capillary 207 (H) 65 - 99 mg/dL  Glucose, capillary     Status: None   Collection Time: 04/09/17  4:45 PM  Result Value Ref Range   Glucose-Capillary 97 65 - 99 mg/dL  Glucose, capillary     Status: Abnormal   Collection Time: 04/09/17  8:49 PM  Result Value Ref Range   Glucose-Capillary 350 (H) 65 - 99 mg/dL  Glucose, capillary     Status: Abnormal   Collection Time: 04/10/17  7:02 AM  Result Value Ref Range   Glucose-Capillary 148 (H) 65 - 99 mg/dL  Glucose, capillary     Status: Abnormal   Collection Time: 04/10/17 12:31 PM  Result Value Ref Range    Glucose-Capillary 122 (H) 65 - 99 mg/dL  Glucose, capillary     Status: Abnormal   Collection Time: 04/10/17  4:10 PM  Result Value Ref Range   Glucose-Capillary 136 (H) 65 - 99 mg/dL  Glucose, capillary     Status: Abnormal   Collection Time: 04/10/17  8:48 PM  Result Value Ref Range   Glucose-Capillary 239 (H) 65 - 99 mg/dL  Glucose, capillary     Status: Abnormal   Collection Time: 04/11/17  6:25 AM  Result Value Ref Range   Glucose-Capillary 149 (H) 65 - 99 mg/dL     HEENT: normal Cardio: irregular and no murmur Resp: CTA B/L and unlabored GI: BS positive and NT, ND Extremity:  Pulses positive and No Edema Skin:   Intact Neuro: Alert/Oriented, Abnormal Sensory oriented to day  but not month or place even with cuing, Abnormal Motor 3- R delt, 4- R triceps, 3- R biceps an grip, Abnormal FMC Ataxic/ dec FMC and Other RLE 3- HF, 4- KE, 3- ADF with pain limitation Musc/Skel:  Other pain over R achilles to palp , no calf swelling or tenderness, pain to palp ~ 15cm above bimalleolar line Gen NAD Reduced sensation  in R L5 and S1 dermatome  Assessment/Plan: 1. Functional deficits secondary to Left pontine infarct which require 3+ hours per day of interdisciplinary therapy in a comprehensive inpatient rehab setting. Physiatrist is providing close team supervision and 24 hour management of active medical problems listed below. Physiatrist and rehab team continue to assess barriers to discharge/monitor patient progress toward functional and medical goals. FIM: Function - Bathing Bathing activity did not occur: N/A Position: Shower Body parts bathed by patient: Right arm, Right lower leg, Left arm, Left lower leg, Chest, Abdomen, Front perineal area, Buttocks, Right upper leg, Left upper leg Body parts bathed by helper: Back Assist Level: Touching or steadying assistance(Pt > 75%)  Function- Upper Body Dressing/Undressing What is the patient wearing?: Pull over shirt/dress Pull over  shirt/dress - Perfomed by patient: Thread/unthread right sleeve, Thread/unthread left sleeve, Put head through opening Pull over shirt/dress - Perfomed by helper: Thread/unthread right sleeve, Thread/unthread left sleeve, Put head through opening, Pull shirt over trunk Assist Level: Touching or steadying assistance(Pt > 75%) Function - Lower Body Dressing/Undressing What is the patient wearing?: Pants, Socks, Shoes Position: Wheelchair/chair at sink Pants- Performed by patient: Pull pants up/down Pants- Performed by helper: Thread/unthread right pants leg, Thread/unthread left pants leg Non-skid slipper socks- Performed by patient: Don/doff left sock Non-skid slipper socks- Performed by helper: Don/doff right sock, Don/doff left sock Socks - Performed by patient: Don/doff left sock, Don/doff right sock Socks - Performed by helper: Don/doff right sock, Don/doff left sock Shoes - Performed by patient: Don/doff left shoe Shoes - Performed by helper: Don/doff right shoe, Don/doff left shoe TED Hose - Performed by helper: Don/doff right TED hose, Don/doff left TED hose Assist for footwear: Maximal assist Assist for lower body dressing: Touching or steadying assistance (Pt > 75%)  Function - Toileting Toileting activity did not occur: No continent bowel/bladder event Toileting steps completed by patient: Adjust clothing prior to toileting, Performs perineal hygiene, Adjust clothing after toileting Toileting steps completed by helper: Adjust clothing after toileting Toileting Assistive Devices: Grab bar or rail Assist level: Touching or steadying assistance (Pt.75%)  Function - Air cabin crew transfer assistive device: Grab bar Assist level to toilet: Touching or steadying assistance (Pt > 75%) Assist level from toilet: Touching or steadying assistance (Pt > 75%)  Function - Chair/bed transfer Chair/bed transfer method: Stand pivot Chair/bed transfer assist level: Touching or  steadying assistance (Pt > 75%) Chair/bed transfer assistive device: Armrests  Function - Locomotion: Wheelchair Max wheelchair distance: 50 Assist Level: Moderate assistance (Pt 50 - 74%) Assist Level: Moderate assistance (Pt 50 - 74%) Wheel 150 feet activity did not occur: Safety/medical concerns Turns around,maneuvers to table,bed, and toilet,negotiates 3% grade,maneuvers on rugs and over doorsills: No Function - Locomotion: Ambulation Ambulation activity did not occur: N/A Assistive device: Walker-rolling Max distance: 45 ' Assist level: Touching or steadying assistance (Pt > 75%) Walk 10 feet activity did not occur: N/A Assist level: Touching or steadying assistance (Pt > 75%) Walk 50 feet with 2 turns activity did not occur: N/A Assist level: Touching or steadying assistance (Pt > 75%) Walk 150 feet activity did not occur: N/A Walk 10 feet on uneven surfaces activity did not occur: N/A  Function - Comprehension Comprehension: Auditory Comprehension assist level: Follows basic conversation/direction with no assist  Function - Expression Expression: Verbal Expression assist level: Expresses basic needs/ideas: With no assist  Function - Social Interaction Social Interaction assist level: Interacts appropriately 90% of the time - Needs monitoring or encouragement for  participation or interaction.  Function - Problem Solving Problem solving assist level: Solves basic 50 - 74% of the time/requires cueing 25 - 49% of the time  Function - Memory Memory assist level: Recognizes or recalls 50 - 74% of the time/requires cueing 25 - 49% of the time Patient normally able to recall (first 3 days only): That he or she is in a hospital, Current season  Medical Problem List and Plan: 1.Increased Right-sided weaknesssecondary to acute infarction left pons as well as history of CVA posterior cerebral artery December 2016 with right-sided residual weakness -CIR PT, OT, SLP  Team conference today please see physician documentation under team conference tab, met with team face-to-face to discuss problems,progress, and goals. Formulized individual treatment plan based on medical history, underlying problem and comorbidities. 2. DVT Prophylaxis/Anticoagulation: Eliquis 3. Pain Management:Lyrica 150 mg daily, Cymbalta 30 mg twice a day,? Diabetic neuropathy- no symmetric numbness  Voltaren gel -right high  ankle no she had a chip on her shoulder she is like she is like my second class citizen is longer myself or see Dr. is sprain-----ice, PRAFO boot for support,Air cast able to bear wt per PT -improving                  4. Mood:Provide emotional support 5. Neuropsych: This patientisnot capable of making decisions on herown behalf. Poor orientation will ask neuropsych to eval 6. Skin/Wound Care:Routine skin checks 7. Fluids/Electrolytes/Nutrition:Routine I&O's with follow-up chemistries 8.Diabetes mellitus and peripheral neuropathy. Hemoglobin A1c 8.6. Levemir 30 units twice a day. Check blood sugars before meals and at bedtime CBG (last 3)  Recent Labs    04/10/17 1610 04/10/17 2048 04/11/17 0625  GLUCAP 136* 239* 149*  Improving with Levemir  40U BID  resumed low dose metformin 12/3, overall much improved pm CBG is high will change metformin to 258m BID  9.Hypothyroidism. Synthroid 10.Hypertension. Avapro 150 mg daily, Norvasc 5 mg daily, Coreg 6.25 mg twice a day. Vitals:   04/10/17 1559 04/11/17 0235  BP: 139/65 (!) 141/73  Pulse: 68 80  Resp: 18 18  Temp: (!) 97.5 F (36.4 C) (!) 97.5 F (36.4 C)  SpO2: 97% 98%  BPs controlled 12/5 11.Hyperlipidemia. Lipitor 12.GERD. Protonix Right   LOS (Days) 8 A FACE TO FACE EVALUATION WAS PERFORMED  ACharlett Blake12/09/2016, 9:02 AM

## 2017-04-11 NOTE — Progress Notes (Signed)
Occupational Therapy Weekly Progress Note  Patient Details  Name: Casey Harrell MRN: [id number] Date of Birth: [birth year]  Beginning of progress report period: April 04, 2017 End of progress report period: April 11, 2017  Today's Date: 04/11/2017 OT Individual Time: 0930-1030 OT Individual Time Calculation (min): 60 min    Patient has met 3 of 4 short term goals. Pt is making steady progress towards OT goals. She is able to use R UE during functional tasks with supervision- min cuing. She demonstrates apraxic and atazic movement with R UE during grooming tasks.  She is on tract to meet supervision level goals, requiring assist due to cognitive impairments and assist with management of RW during functional tasks and safety awareness.   Patient continues to demonstrate the following deficits: ataxia, cognitive deficits, hemiplegia affecting dominant side and muscle weakness (generalized) and therefore will continue to benefit from skilled OT intervention to enhance overall performance with BADL and Reduce care partner burden.  Patient progressing toward long term goals..  Continue plan of care.  OT Short Term Goals Week 1:  OT Short Term Goal 1 (Week 1): Pt will complete 3/3 toileting task with CGA OT Short Term Goal 1 - Progress (Week 1): Met OT Short Term Goal 2 (Week 1): Pt will dress LB with steadying assist OT Short Term Goal 2 - Progress (Week 1): Met OT Short Term Goal 3 (Week 1): Pt will use R UE at dominatnt level during grooming task with min cuing  OT Short Term Goal 3 - Progress (Week 1): Progressing toward goal OT Short Term Goal 4 (Week 1): Pt will utilize hemi dressing technique with min cuing OT Short Term Goal 5 (Week 1): Pt will be oriented to place and situation with min questioning cues OT Short Term Goal 5 - Progress (Week 1): Met Week 2:  OT Short Term Goal 1 (Week 2): STG=LTG due to LOS  Skilled Therapeutic Interventions/Progress Updates:    Pt seen for OT ADL  bathing/dressing session. Pt sitting up in w/c upon arrival, agreeable to tx session. She ambulated throughout session with RW and guarding assist. She bathed seated on tub bench, standing with use of grab bar to complete buttock hygiene. VCs for safety awareness as pt attempting to stand to complete all bathing and requiring steadying assist for dynamic balance.  She returned to toilet to dress, completing with supervision, min A for dynamic sitting balance when putting shirt over head.  She completed grooming tasks standing at sink with close supervision.  Following seated rest break, she completed standing fine motor task, required to manipulate pages of newspaper with R UE, completed with increased time. Pt left seated in w/c at end of session, all needs in reach and husband present.   Therapy Documentation Precautions:  Precautions Precautions: Fall Precaution Comments: R ankle bruising Restrictions Weight Bearing Restrictions: No Pain: Pain Assessment Pain Assessment: No/denies pain Pain Score: 0-No pain  See Function Navigator for Current Functional Status.   Therapy/Group: Individual Therapy  Reba Hulett L 04/11/2017, 7:08 AM

## 2017-04-12 ENCOUNTER — Inpatient Hospital Stay (HOSPITAL_COMMUNITY): Payer: Medicare Other | Admitting: Physical Therapy

## 2017-04-12 ENCOUNTER — Inpatient Hospital Stay (HOSPITAL_COMMUNITY): Payer: Medicare Other | Admitting: Occupational Therapy

## 2017-04-12 ENCOUNTER — Inpatient Hospital Stay (HOSPITAL_COMMUNITY): Payer: Medicare Other | Admitting: Speech Pathology

## 2017-04-12 DIAGNOSIS — I69398 Other sequelae of cerebral infarction: Secondary | ICD-10-CM

## 2017-04-12 DIAGNOSIS — I69319 Unspecified symptoms and signs involving cognitive functions following cerebral infarction: Secondary | ICD-10-CM

## 2017-04-12 DIAGNOSIS — R269 Unspecified abnormalities of gait and mobility: Secondary | ICD-10-CM

## 2017-04-12 LAB — GLUCOSE, CAPILLARY
Glucose-Capillary: 102 mg/dL — ABNORMAL HIGH (ref 65–99)
Glucose-Capillary: 115 mg/dL — ABNORMAL HIGH (ref 65–99)
Glucose-Capillary: 218 mg/dL — ABNORMAL HIGH (ref 65–99)
Glucose-Capillary: 167 mg/dL — ABNORMAL HIGH (ref 65–99)

## 2017-04-12 NOTE — Progress Notes (Signed)
Speech Language Pathology Daily Session Note  Patient Details  Name: Casey Harrell MRN: [id number] Date of Birth: 12-08-1934  Today's Date: 04/12/2017 SLP Individual Time: 1445-1530 SLP Individual Time Calculation (min): 45 min  Short Term Goals: Week 2: SLP Short Term Goal 1 (Week 2): STG=LTG due to ELOS  Skilled Therapeutic Interventions:  Pt was seen for skilled ST targeting cognitive goals.  SLP facilitated the session with a novel card game to address problem solving and recall.  Pt needed min assist faded to supervision cues for working memory of task protocols and procedures.  She was then able to plan and execute a problem solving strategy with overall min assist verbal cues.  Pt was returned to room and left in bed with bed alarm set.  Continue per current plan of care.    Function:  Eating Eating                 Cognition Comprehension Comprehension assist level: Follows basic conversation/direction with no assist  Expression   Expression assist level: Expresses basic needs/ideas: With no assist  Social Interaction Social Interaction assist level: Interacts appropriately 90% of the time - Needs monitoring or encouragement for participation or interaction.  Problem Solving Problem solving assist level: Solves basic 75 - 89% of the time/requires cueing 10 - 24% of the time  Memory Memory assist level: Recognizes or recalls 50 - 74% of the time/requires cueing 25 - 49% of the time    Pain Pain Assessment Pain Assessment: No/denies pain  Therapy/Group: Individual Therapy  Casey Harrell, Casey Harrell 04/12/2017, 9:00 PM

## 2017-04-12 NOTE — Discharge Instructions (Signed)
Inpatient Rehab Discharge Instructions  Casey Harrell Discharge date and time: No discharge date for patient encounter.   Activities/Precautions/ Functional Status: Activity: activity as tolerated Diet: diabetic diet Wound Care: none needed Functional status:  ___ No restrictions     ___ Walk up steps independently ___ 24/7 supervision/assistance   ___ Walk up steps with assistance ___ Intermittent supervision/assistance  ___ Bathe/dress independently ___ Walk with walker     _x STROKE/TIA DISCHARGE INSTRUCTIONS SMOKING Cigarette smoking nearly doubles your risk of having a stroke & is the single most alterable risk factor  If you smoke or have smoked in the last 12 months, you are advised to quit smoking for your health.  Most of the excess cardiovascular risk related to smoking disappears within a year of stopping.  Ask you doctor about anti-smoking medications  [place] Quit Line: 1-800-QUIT NOW  Free Smoking Cessation Classes [phone]  CHOLESTEROL Know your levels; limit fat & cholesterol in your diet  Lipid Panel     Component Value Date/Time   CHOL 102 03/31/2017 0259   TRIG 92 03/31/2017 0259   HDL 27 (L) 03/31/2017 0259   CHOLHDL 3.8 03/31/2017 0259   VLDL 18 03/31/2017 0259   LDLCALC 57 03/31/2017 0259      Many patients benefit from treatment even if their cholesterol is at goal.  Goal: Total Cholesterol (CHOL) less than 160  Goal:  Triglycerides (TRIG) less than 150  Goal:  HDL greater than 40  Goal:  LDL (LDLCALC) less than 100   BLOOD PRESSURE American Stroke Association blood pressure target is less that 120/80 mm/Hg  Your discharge blood pressure is:  BP: (!) 141/73  Monitor your blood pressure  Limit your salt and alcohol intake  Many individuals will require more than one medication for high blood pressure  DIABETES (A1c is a blood sugar average for last 3 months) Goal HGBA1c is under 7% (HBGA1c is blood sugar average for last 3 months)   Diabetes:   Lab Results  Component Value Date   HGBA1C 8.6 (H) 03/31/2017     Your HGBA1c can be lowered with medications, healthy diet, and exercise.  Check your blood sugar as directed by your physician  Call your physician if you experience unexplained or low blood sugars.  PHYSICAL ACTIVITY/REHABILITATION Goal is 30 minutes at least 4 days per week  Activity: Increase activity slowly, Therapies: Physical Therapy: Home Health Return to work:   Activity decreases your risk of heart attack and stroke and makes your heart stronger.  It helps control your weight and blood pressure; helps you relax and can improve your mood.  Participate in a regular exercise program.  Talk with your doctor about the best form of exercise for you (dancing, walking, swimming, cycling).  DIET/WEIGHT Goal is to maintain a healthy weight  Your discharge diet is: Diet heart healthy/carb modified Room service appropriate? Yes; Fluid consistency: Thin  liquids Your height is:  Height: 5\' 1"  (154.9 cm) Your current weight is: Weight: 64 kg (141 lb 1.5 oz) Your Body Mass Index (BMI) is:  BMI (Calculated): 26.67  Following the type of diet specifically designed for you will help prevent another stroke.  Your goal weight range is:    Your goal Body Mass Index (BMI) is 19-24.  Healthy food habits can help reduce 3 risk factors for stroke:  High cholesterol, hypertension, and excess weight.  RESOURCES Stroke/Support Group:  Call [phone]   STROKE EDUCATION PROVIDED/REVIEWED AND GIVEN TO PATIENT Stroke  warning signs and symptoms How to activate emergency medical system (call 911). Medications prescribed at discharge. Need for follow-up after discharge. Personal risk factors for stroke. Pneumonia vaccine given:  Flu vaccine given:  My questions have been answered, the writing is legible, and I understand these instructions.  I will adhere to these goals & educational materials that have been provided  to me after my discharge from the hospital.   __ Bathe/dress with assistance ___ Walk Independently    ___ Shower independently ___ Walk with assistance    ___ Shower with assistance ___ No alcohol     ___ Return to work/school ________  Special Instructions:    COMMUNITY REFERRALS UPON DISCHARGE:    Home Health:   PT, OT, RN, SP  Agency:[facility] Phone:[phone]   Date of last service:04/14/2017  Medical Equipment/Items Ordered:HAS FROM PREVIOUS ADMITS    GENERAL COMMUNITY RESOURCES FOR PATIENT/FAMILY: Support Groups:[facility] SUPPORT GROUP  EVERY SECOND Thursday @ 3:00-4:00 PM ON THE REHAB UNIT QUESTIONS CONTACT CA[facility] [phone]  My questions have been answered and I understand these instructions. I will adhere to these goals and the provided educational materials after my discharge from the hospital.  Patient/Caregiver Signature _______________________________ Date __________  Clinician Signature _______________________________________ Date __________  Please bring this form and your medication list with you to all your follow-up doctor's appointments.

## 2017-04-12 NOTE — Progress Notes (Signed)
Physical Therapy Session Note  Patient Details  Name: Casey Harrell MRN: [id number] Date of Birth: 1934-12-31  Today's Date: 04/12/2017 PT Individual Time: 0900-1000 AND 1400-1445 PT Individual Time Calculation (min): 60 min AND 45 min   Short Term Goals: Week 2:  PT Short Term Goal 1 (Week 2): STG =LTG due to ELOS  Skilled Therapeutic Interventions/Progress Updates:  Session 1 Pt received sitting in WC and agreeable to PT  PT instructed pt in gait training in various environments including hall of rehab unit as well as simulated community environment of hospital gift shop. 161f x 2 with RW and supervision assist from PT. minm cues for improved step height and safety in turns.   Dynamic balance training and cognitive task to complete peg board puzzle while standing on ariex pad. Intermittent 0-1 UE support with supervision assist to maintain balance. Min questioning cues for error detection and correction.   Bed mobilit for sit<>supine and prone to Qped, as well as initiation of floor transfers through tall kneeling. Supervision assist overall for all tasks completed. Pt unable to power up into chat to complete transfer due to fatigue at this time.    Pt returned to room and performed standing pivot transfer without AD and min assist. Sit>supine completed with supervision assist,  and  Pt left supine in bed with call bell in reach and all needs met.   Session 2.  Pt received supine in bed and agreeable to PT. Supine>sit transfer with  Supervision assist and increased time   Gait training to and from day room with RW x 1017fwith supervision assist from PT. Min cues for increased step height on carpet and transition stip management.   Nustep reciprocal movement and endurance training x 8 minutes, level 3. Moderate cues for full ROM and proper speed to improved endurance training aspect of exercise. Pt reports Borg 14/20 at completion.   Standing tolerance/balance to play checkers at  high/low table. X 8 minutes. With 1 UE support.   [facility] and all needs met.           Therapy Documentation Precautions:  Precautions Precautions: Fall Precaution Comments: R ankle bruising Restrictions Weight Bearing Restrictions: No   Pain: Pain Assessment Pain Assessment: No/denies pain  See Function Navigator for Current Functional Status.   Therapy/Group: Individual Therapy  AuLorie Phenix2/10/2016, 10:05 AM

## 2017-04-12 NOTE — Progress Notes (Signed)
Subjective/Complaints:  No issues overnite  ROS- no CP SOB, abd pain, neg N/V Objective: Vital Signs: Blood pressure (!) 148/49, pulse 63, temperature 98.2 F (36.8 C), temperature source Oral, resp. rate 17, height 5\' 1"  (1.549 m), weight 64 kg (141 lb 1.5 oz), SpO2 95 %. No results found. Results for orders placed or performed during the hospital encounter of 04/03/17 (from the past 72 hour(s))  Glucose, capillary     Status: Abnormal   Collection Time: 04/09/17 11:35 AM  Result Value Ref Range   Glucose-Capillary 378 (H) 65 - 99 mg/dL  Glucose, capillary     Status: Abnormal   Collection Time: 04/09/17  4:44 PM  Result Value Ref Range   Glucose-Capillary 207 (H) 65 - 99 mg/dL  Glucose, capillary     Status: None   Collection Time: 04/09/17  4:45 PM  Result Value Ref Range   Glucose-Capillary 97 65 - 99 mg/dL  Glucose, capillary     Status: Abnormal   Collection Time: 04/09/17  8:49 PM  Result Value Ref Range   Glucose-Capillary 350 (H) 65 - 99 mg/dL  Glucose, capillary     Status: Abnormal   Collection Time: 04/10/17  7:02 AM  Result Value Ref Range   Glucose-Capillary 148 (H) 65 - 99 mg/dL  Glucose, capillary     Status: Abnormal   Collection Time: 04/10/17 12:31 PM  Result Value Ref Range   Glucose-Capillary 122 (H) 65 - 99 mg/dL  Glucose, capillary     Status: Abnormal   Collection Time: 04/10/17  4:10 PM  Result Value Ref Range   Glucose-Capillary 136 (H) 65 - 99 mg/dL  Glucose, capillary     Status: Abnormal   Collection Time: 04/10/17  8:48 PM  Result Value Ref Range   Glucose-Capillary 239 (H) 65 - 99 mg/dL  Glucose, capillary     Status: Abnormal   Collection Time: 04/11/17  6:25 AM  Result Value Ref Range   Glucose-Capillary 149 (H) 65 - 99 mg/dL  Glucose, capillary     Status: Abnormal   Collection Time: 04/11/17 11:44 AM  Result Value Ref Range   Glucose-Capillary 189 (H) 65 - 99 mg/dL  Glucose, capillary     Status: Abnormal   Collection Time:  04/11/17  4:16 PM  Result Value Ref Range   Glucose-Capillary 194 (H) 65 - 99 mg/dL  Glucose, capillary     Status: Abnormal   Collection Time: 04/11/17 10:04 PM  Result Value Ref Range   Glucose-Capillary 158 (H) 65 - 99 mg/dL  Glucose, capillary     Status: Abnormal   Collection Time: 04/12/17  6:46 AM  Result Value Ref Range   Glucose-Capillary 102 (H) 65 - 99 mg/dL     HEENT: normal Cardio: irregular and no murmur Resp: CTA B/L and unlabored GI: BS positive and NT, ND Extremity:  Pulses positive and No Edema Skin:   Intact Neuro: Alert/Oriented, Abnormal Sensory oriented to day  but not month or place even with cuing, Abnormal Motor 3- R delt, 4- R triceps, 3- R biceps an grip, Abnormal FMC Ataxic/ dec FMC and Other RLE 3- HF, 4- KE, 3- ADF with pain limitation Musc/Skel:  Other pain over R achilles to palp , no calf swelling or tenderness, pain to palp ~ 15cm above bimalleolar line Gen NAD Reduced sensation in R L5 and S1 dermatome  Assessment/Plan: 1. Functional deficits secondary to Left pontine infarct which require 3+ hours per day of interdisciplinary therapy in  a comprehensive inpatient rehab setting. Physiatrist is providing close team supervision and 24 hour management of active medical problems listed below. Physiatrist and rehab team continue to assess barriers to discharge/monitor patient progress toward functional and medical goals. FIM: Function - Bathing Bathing activity did not occur: N/A Position: Shower Body parts bathed by patient: Right arm, Right lower leg, Left arm, Left lower leg, Chest, Abdomen, Front perineal area, Buttocks, Right upper leg, Left upper leg, Back Body parts bathed by helper: Back Assist Level: Supervision or verbal cues  Function- Upper Body Dressing/Undressing What is the patient wearing?: Pull over shirt/dress Pull over shirt/dress - Perfomed by patient: Thread/unthread right sleeve, Thread/unthread left sleeve, Put head through  opening, Pull shirt over trunk Pull over shirt/dress - Perfomed by helper: Thread/unthread right sleeve, Thread/unthread left sleeve, Put head through opening, Pull shirt over trunk Assist Level: More than reasonable time Function - Lower Body Dressing/Undressing What is the patient wearing?: Pants, Socks, Shoes, Underwear Position: Wheelchair/chair at [employer] Underwear - Performed by patient: Thread/unthread right underwear leg, Thread/unthread left underwear leg, Pull underwear up/down Pants- Performed by patient: Thread/unthread right pants leg, Thread/unthread left pants leg, Pull pants up/down Pants- Performed by helper: Thread/unthread right pants leg, Thread/unthread left pants leg Non-skid slipper socks- Performed by patient: Don/doff left sock Non-skid slipper socks- Performed by helper: Don/doff right sock, Don/doff left sock Socks - Performed by patient: Don/doff left sock, Don/doff right sock Socks - Performed by helper: Don/doff right sock, Don/doff left sock Shoes - Performed by patient: Don/doff right shoe, Don/doff left shoe(Elastic shoelaces) Shoes - Performed by helper: Don/doff right shoe, Don/doff left shoe TED Hose - Performed by helper: Don/doff right TED hose, Don/doff left TED hose Assist for footwear: Supervision/touching assist Assist for lower body dressing: Supervision or verbal cues  Function - Toileting Toileting activity did not occur: No continent bowel/bladder event Toileting steps completed by patient: Adjust clothing prior to toileting, Performs perineal hygiene, Adjust clothing after toileting Toileting steps completed by helper: Adjust clothing after toileting Toileting Assistive Devices: Grab bar or rail Assist level: Touching or steadying assistance (Pt.75%)  Function - ArchivistToilet Transfers Toilet transfer assistive device: Grab bar, Walker Assist level to toilet: Supervision or verbal cues Assist level from toilet: Supervision or verbal cues  Function -  Chair/bed transfer Chair/bed transfer method: Stand pivot Chair/bed transfer assist level: Touching or steadying assistance (Pt > 75%) Chair/bed transfer assistive device: Armrests  Function - Locomotion: Wheelchair Max wheelchair distance: 50 Assist Level: Moderate assistance (Pt 50 - 74%) Assist Level: Moderate assistance (Pt 50 - 74%) Wheel 150 feet activity did not occur: Safety/medical concerns Turns around,maneuvers to table,bed, and toilet,negotiates 3% grade,maneuvers on rugs and over doorsills: No Function - Locomotion: Ambulation Ambulation activity did not occur: N/A Assistive device: Walker-rolling Max distance: 975 ' Assist level: Touching or steadying assistance (Pt > 75%) Walk 10 feet activity did not occur: N/A Assist level: Touching or steadying assistance (Pt > 75%) Walk 50 feet with 2 turns activity did not occur: N/A Assist level: Touching or steadying assistance (Pt > 75%) Walk 150 feet activity did not occur: N/A Walk 10 feet on uneven surfaces activity did not occur: N/A  Function - Comprehension Comprehension: Auditory Comprehension assist level: Follows basic conversation/direction with no assist  Function - Expression Expression: Verbal Expression assist level: Expresses basic needs/ideas: With no assist  Function - Social Interaction Social Interaction assist level: Interacts appropriately 90% of the time - Needs monitoring or encouragement for participation or interaction.  Function - Problem Solving Problem solving assist level: Solves basic 50 - 74% of the time/requires cueing 25 - 49% of the time, Solves basic 75 - 89% of the time/requires cueing 10 - 24% of the time  Function - Memory Memory assist level: Recognizes or recalls 75 - 89% of the time/requires cueing 10 - 24% of the time, Recognizes or recalls 50 - 74% of the time/requires cueing 25 - 49% of the time Patient normally able to recall (first 3 days only): That he or she is in a hospital,  Current season  Medical Problem List and Plan: 1.Increased Right-sided weaknesssecondary to acute infarction left pons as well as history of CVA posterior cerebral artery December 2016 with right-sided residual weakness -CIR PT, OT, SLP 2. DVT Prophylaxis/Anticoagulation: Eliquis 3. Pain Management:Lyrica 150 mg daily, Cymbalta 30 mg twice a day,? Diabetic neuropathy- no symmetric numbness  Voltaren gel -ice, PRAFO boot for support,Air cast able to bear wt per PT -improving                  4. Mood:Provide emotional support 5. Neuropsych: This patientisnot capable of making decisions on herown behalf. Poor orientation will ask neuropsych to eval 6. Skin/Wound Care:Routine skin checks 7. Fluids/Electrolytes/Nutrition:Routine I&O's with follow-up chemistries 8.Diabetes mellitus and peripheral neuropathy. Hemoglobin A1c 8.6. Levemir 30 units twice a day. Check blood sugars before meals and at bedtime CBG (last 3)  Recent Labs    04/11/17 1616 04/11/17 2204 04/12/17 0646  GLUCAP 194* 158* 102*  controlled with Levemir  40U BID metformin  250mg  BID  9.Hypothyroidism. Synthroid 10.Hypertension. Avapro 150 mg daily, Norvasc 5 mg daily, Coreg 6.25 mg twice a day. Vitals:   04/11/17 1515 04/12/17 0417  BP: (!) 144/67 (!) 148/49  Pulse: 60 63  Resp: 18 17  Temp: (!) 97.5 F (36.4 C) 98.2 F (36.8 C)  SpO2: 96% 95%  BPs controlled 12/6 11.Hyperlipidemia. Lipitor 12.GERD. Protonix Right   LOS (Days) 9 A FACE TO FACE EVALUATION WAS PERFORMED  Erick Colace 04/12/2017, 8:28 AM

## 2017-04-12 NOTE — Progress Notes (Signed)
Occupational Therapy Session Note  Patient Details  Name: Casey Harrell MRN: [id number] Date of Birth: [birth year]  Today's Date: 04/12/2017 OT Individual Time: [id number] OT Individual Time Calculation (min): 60 min    Short Term Goals: Week 2:  OT Short Term Goal 1 (Week 2): STG=LTG due to LOS  Skilled Therapeutic Interventions/Progress Updates:    Pt seen for OT session focusing on ADL re-training. Pt sitting up in bed upon arrival with NT present providing supervision for meal. Pt agreeable to tx session. She declied bathing this morning, opting just to change clothes. She transferred to EOB from flat bed in simulation of home environment mod I. Stood with CGA to pull pants up, one epsiode of knee bucking requiring mod A to regain balance. VCs for safety awareness during dressing task.  She ambulated with HHA into bathroom and completed toileting task with guarding assist, returning to sink to complete hand hygiene.  Pt taken to ADL apartment in w/c total A for time management. Completed functional transfer to low soft surface couch with guarding assist, able to stand with supervision and VCs for technique.  In kitchen, stood at cabinet to obtain items with R UE and increased time. Tolerated ~3-4 minutes of standing before requiring seated rest break.  Pt taken back to room at end of session, left seated in w/c with QRB donned and all needs in reach. Pt able to recall use of call bell for assist.    Therapy Documentation Precautions:  Precautions Precautions: Fall Precaution Comments: R ankle bruising Restrictions Weight Bearing Restrictions: No Pain:   No/ denies pain  See Function Navigator for Current Functional Status.   Therapy/Group: Individual Therapy  Ludell Zacarias L 04/12/2017, 6:57 AM

## 2017-04-13 ENCOUNTER — Inpatient Hospital Stay (HOSPITAL_COMMUNITY): Payer: Medicare Other | Admitting: Physical Therapy

## 2017-04-13 ENCOUNTER — Inpatient Hospital Stay (HOSPITAL_COMMUNITY): Payer: Medicare Other | Admitting: Occupational Therapy

## 2017-04-13 ENCOUNTER — Inpatient Hospital Stay (HOSPITAL_COMMUNITY): Payer: Medicare Other | Admitting: Speech Pathology

## 2017-04-13 LAB — GLUCOSE, CAPILLARY
Glucose-Capillary: 133 mg/dL — ABNORMAL HIGH (ref 65–99)
Glucose-Capillary: 131 mg/dL — ABNORMAL HIGH (ref 65–99)
Glucose-Capillary: 162 mg/dL — ABNORMAL HIGH (ref 65–99)
Glucose-Capillary: 240 mg/dL — ABNORMAL HIGH (ref 65–99)

## 2017-04-13 MED ORDER — ATORVASTATIN CALCIUM 40 MG PO TABS: 40.0000 mg | ORAL_TABLET | Freq: Every day | ORAL | 1 refills | Status: DC

## 2017-04-13 MED ORDER — PANTOPRAZOLE SODIUM 40 MG PO TBEC: 40.0000 mg | DELAYED_RELEASE_TABLET | Freq: Two times a day (BID) | ORAL | 1 refills | Status: DC

## 2017-04-13 MED ORDER — APIXABAN 5 MG PO TABS: ORAL_TABLET | ORAL | 11 refills | Status: DC

## 2017-04-13 MED ORDER — METFORMIN HCL 500 MG PO TABS: 250.0000 mg | ORAL_TABLET | Freq: Two times a day (BID) | ORAL | 0 refills | Status: DC

## 2017-04-13 MED ORDER — PREGABALIN 150 MG PO CAPS: 150.0000 mg | ORAL_CAPSULE | Freq: Every day | ORAL | 1 refills | Status: DC

## 2017-04-13 MED ORDER — IRBESARTAN 150 MG PO TABS: 150.0000 mg | ORAL_TABLET | Freq: Every day | ORAL | 1 refills | Status: DC

## 2017-04-13 MED ORDER — MECLIZINE HCL 25 MG PO TABS: 25.0000 mg | ORAL_TABLET | Freq: Three times a day (TID) | ORAL | 1 refills | Status: DC | PRN

## 2017-04-13 MED ORDER — INSULIN DETEMIR 100 UNIT/ML FLEXPEN: PEN_INJECTOR | SUBCUTANEOUS | 5 refills | Status: DC

## 2017-04-13 MED ORDER — DICLOFENAC SODIUM 1 % TD GEL: 2.0000 g | Freq: Four times a day (QID) | TRANSDERMAL | 0 refills | Status: DC

## 2017-04-13 MED ORDER — INSULIN ASPART 100 UNIT/ML FLEXPEN: 8.0000 [IU] | PEN_INJECTOR | Freq: Three times a day (TID) | SUBCUTANEOUS | 11 refills | Status: DC

## 2017-04-13 MED ORDER — LEVOTHYROXINE SODIUM 50 MCG PO TABS: ORAL_TABLET | ORAL | 1 refills | Status: DC

## 2017-04-13 MED ORDER — DULOXETINE HCL 30 MG PO CPEP: 30.0000 mg | ORAL_CAPSULE | Freq: Two times a day (BID) | ORAL | 3 refills | Status: DC

## 2017-04-13 MED ORDER — CARVEDILOL 6.25 MG PO TABS: ORAL_TABLET | ORAL | 1 refills | Status: DC

## 2017-04-13 MED ORDER — VITAMIN D3 50 MCG (2000 UT) PO TABS: 2000.0000 [IU] | ORAL_TABLET | Freq: Every day | ORAL | 0 refills | Status: AC

## 2017-04-13 MED ORDER — AMLODIPINE BESYLATE 5 MG PO TABS: 5.0000 mg | ORAL_TABLET | Freq: Every day | ORAL | 0 refills | Status: DC

## 2017-04-13 NOTE — Progress Notes (Signed)
Physical Therapy Discharge Summary  Patient Details  Name: Casey Harrell MRN: [id number] Date of Birth: 23-Oct-1934  Today's Date: 04/13/2017 PT Individual Time: 1100-1200 PT Individual Time Calculation (min): 60 min    Patient has met 10 of 10 long term goals due to improved activity tolerance, improved balance, improved postural control, increased strength, increased range of motion, decreased pain, functional use of  right upper extremity and right lower extremity, improved attention and improved awareness.  Patient to discharge at an ambulatory level Supervision.   Patient's care partner is independent to provide the necessary physical and cognitive assistance at discharge.   Recommendation:  Patient will benefit from ongoing skilled PT services in home health setting to continue to advance safe functional mobility, address ongoing impairments in balance awareness, safety, strength, gait, bed mobility, and minimize fall risk.  Equipment: No equipment provided  Reasons for discharge: treatment goals met and discharge from hospital  Patient/family agrees with progress made and goals achieved: Yes   PT treatment Pt received supine in bed and agreeable to PT. Supine>sit transfer with supervision assist and mincues for no use of bed rails. PT instructed pt in Grad day assessment to measure progress toward goals. See below for details. PT instructed pt in Berg balance test. Patient demonstrates increased fall risk as noted by score of   35/56 on Berg Balance Scale.  (<36= high risk for falls, close to 100%; 37-45 significant >80%; 46-51 moderate >50%; 52-55 lower >25%) Patient returned to room and left sitting in [employer] with call bell in reach and all needs met.       PT Discharge Precautions/Restrictions Precautions Precautions: Fall Required Braces or Orthoses: Other Brace/Splint Other Brace/Splint: has R ankle aircast for support Restrictions Weight Bearing Restrictions: No Vital  Signs   Pain Pain Assessment Pain Assessment: No/denies pain Vision/Perception  Perception Perception: Within Functional Limits Praxis Praxis: Intact  Cognition Orientation Level: Oriented to person;Oriented to place Sensation Sensation Light Touch: Appears Intact Proprioception: Appears Intact Coordination Gross Motor Movements are Fluid and Coordinated: No Fine Motor Movements are Fluid and Coordinated: No Coordination and Movement Description: mild ataxia in RUE and RLE Motor  Motor Motor: Hemiplegia Motor - Discharge Observations: mild R hemiplegia  Mobility Bed Mobility Bed Mobility: Rolling Right;Rolling Left;Supine to Sit;Sit to Supine Rolling Right: 5: Supervision Rolling Left: 5: Supervision Supine to Sit: 5: Supervision Sit to Supine: 5: Supervision Transfers Transfers: Yes Sit to Stand: 5: Supervision Sit to Stand Details: Verbal cues for safe use of DME/AE Sit to Stand Details (indicate cue type and reason): with RW Stand Pivot Transfers: 5: Supervision Stand Pivot Transfer Details: Verbal cues for precautions/safety Stand Pivot Transfer Details (indicate cue type and reason): with RW Locomotion  Ambulation Ambulation: Yes Ambulation/Gait Assistance: 5: Supervision Ambulation Distance (Feet): 150 Feet Assistive device: Rolling walker Gait Gait: Yes Gait Pattern: Impaired Gait Pattern: Poor foot clearance - right Stairs / Additional Locomotion Stairs: Yes Stairs Assistance: 4: Min guard Stairs Assistance Details: Verbal cues for gait pattern;Verbal cues for safe use of DME/AE Stair Management Technique: One rail Left Number of Stairs: 12 Height of Stairs: 6 Wheelchair Mobility Wheelchair Mobility: No  Trunk/Postural Assessment  Cervical Assessment Cervical Assessment: Exceptions to [facility] Thoracic Assessment Thoracic Assessment: Exceptions to WFL(Kyphotic) Lumbar Assessment Lumbar Assessment: Exceptions to WFL(Posterior pelvic tilt) Postural  Control Postural Control: Within Functional Limits  Balance Balance Balance Assessed: Yes Standardized Balance Assessment Standardized Balance Assessment: Berg Balance Test Berg Balance Test Sit to Stand: Able to stand  independently using hands Standing Unsupported: Able to stand 2 minutes with supervision Sitting with Back Unsupported but Feet Supported on Floor or Stool: Able to sit safely and securely 2 minutes Stand to Sit: Controls descent by using hands Transfers: Able to transfer safely, definite need of hands Standing Unsupported with Eyes Closed: Able to stand 10 seconds with supervision Standing Ubsupported with Feet Together: Able to place feet together independently but unable to hold for 30 seconds From Standing, Reach Forward with Outstretched Arm: Can reach forward >12 cm safely (5") From Standing Position, Pick up Object from Floor: Able to pick up shoe, needs supervision From Standing Position, Turn to Look Behind Over each Shoulder: Looks behind one side only/other side shows less weight shift Turn 360 Degrees: Needs close supervision or verbal cueing Standing Unsupported, Alternately Place Feet on Step/Stool: Able to complete >2 steps/needs minimal assist Standing Unsupported, One Foot in Front: Needs help to step but can hold 15 seconds Standing on One Leg: Tries to lift leg/unable to hold 3 seconds but remains standing independently Total Score: 34 Static Sitting Balance Static Sitting - Level of Assistance: 6: Modified independent (Device/Increase time) Dynamic Sitting Balance Dynamic Sitting - Level of Assistance: 5: Stand by assistance Static Standing Balance Static Standing - Level of Assistance: 5: Stand by assistance Dynamic Standing Balance Dynamic Standing - Level of Assistance: 5: Stand by assistance Dynamic Standing - Comments: with UE support on RW  Extremity Assessment      RLE Assessment RLE Assessment: Exceptions to [facility] RLE Strength RLE  Overall Strength Comments: 4/5 limitede by pain in the mid tibial region LLE Assessment LLE Assessment: Within Functional Limits   See Function Navigator for Current Functional Status.  Lorie Phenix 04/13/2017, 11:56 AM

## 2017-04-13 NOTE — Progress Notes (Signed)
Occupational Therapy Session Note  Patient Details  Name: Casey Harrell MRN: [id number] Date of Birth: 23-Dec-1934  Today's Date: 04/13/2017 OT Individual Time: 0945-1100 OT Individual Time Calculation (min): 75 min    Short Term Goals: Week 2:  OT Short Term Goal 1 (Week 2): STG=LTG due to LOS  Skilled Therapeutic Interventions/Progress Updates:    Pt seen for OT ADL bathing/dressing session. Pt sitting up in w/c upon arrival with RN present adminstering care, pt agreeable to tx session. She ambulatd throughout session with RW and close supervision, completing functional transfers in the same manner with VCs for hand palcement on RW during sit<> stand. She bathed seated on tub bench, standing with use of grab bar to complete pericare/ buttock hygiene. She dressed seated on toilet, VCs to recall hemi dressing technique. Grooming tasks cmpleted standing at sink, able to use R UE at non-dominant level mod I. In therapy gym, completed 9 hole peg test, see d/c summary for details. Required min cuing to recall directions after each trial. She then completed functional ambulation throughout unit with use of shopping cart as AD. Complete with close supervision, VCs for safe management. Pt left seated in w/c at end of session in therapy gym for hand off to PT.   Therapy Documentation Precautions:  Precautions Precautions: Fall Precaution Comments: R ankle bruising Restrictions Weight Bearing Restrictions: No Pain:   No/ denies pain  See Function Navigator for Current Functional Status.   Therapy/Group: Individual Therapy  Adalynd Donahoe L 04/13/2017, 7:06 AM

## 2017-04-13 NOTE — Progress Notes (Signed)
Social Work  Discharge Note  The overall goal for the admission was met for: DC Sat 12/8  Discharge location: [place]  Length of Stay: Yes-11 DAYS  Discharge activity level: Yes-SUPERVISION-MIN ASSIST LEVEL   Home/community participation: Yes  Services provided included: MD, RD, PT, OT, SLP, RN, CM, TR, Pharmacy and SW  Financial Services: Private Insurance: [facility]  Follow-up services arranged: Home Health: [facility]-PT,OT,SP,RN and Patient/Family request agency HH: HAD BEFORE, DME: NO NEEDS  Comments (or additional information):SON AND DAUGHTER HAVE BEEN THOUGHT HIS BEFORE AND AWARE PT WILL NEED 24 HR CARE UPON DISCHARGE. HAS ALL EQUIPMENT FROM PAST CVA. READY FOR DISCHARGE TOMORROW.  Patient/Family verbalized understanding of follow-up arrangements: Yes  Individual responsible for coordination of the follow-up plan: CLIFTON-SON  Confirmed correct DME delivered: Elease Hashimoto 04/13/2017    Elease Hashimoto

## 2017-04-13 NOTE — Progress Notes (Signed)
Subjective/Complaints:  No issues overnite , pt eating breakfast  ROS- no CP SOB, abd pain, neg N/V Objective: Vital Signs: Blood pressure 134/75, pulse 75, temperature 97.6 F (36.4 C), temperature source Oral, resp. rate 16, height 5\' 1"  (1.549 m), weight 64 kg (141 lb 1.5 oz), SpO2 93 %. No results found. Results for orders placed or performed during the hospital encounter of 04/03/17 (from the past 72 hour(s))  Glucose, capillary     Status: Abnormal   Collection Time: 04/10/17 12:31 PM  Result Value Ref Range   Glucose-Capillary 122 (H) 65 - 99 mg/dL  Glucose, capillary     Status: Abnormal   Collection Time: 04/10/17  4:10 PM  Result Value Ref Range   Glucose-Capillary 136 (H) 65 - 99 mg/dL  Glucose, capillary     Status: Abnormal   Collection Time: 04/10/17  8:48 PM  Result Value Ref Range   Glucose-Capillary 239 (H) 65 - 99 mg/dL  Glucose, capillary     Status: Abnormal   Collection Time: 04/11/17  6:25 AM  Result Value Ref Range   Glucose-Capillary 149 (H) 65 - 99 mg/dL  Glucose, capillary     Status: Abnormal   Collection Time: 04/11/17 11:44 AM  Result Value Ref Range   Glucose-Capillary 189 (H) 65 - 99 mg/dL  Glucose, capillary     Status: Abnormal   Collection Time: 04/11/17  4:16 PM  Result Value Ref Range   Glucose-Capillary 194 (H) 65 - 99 mg/dL  Glucose, capillary     Status: Abnormal   Collection Time: 04/11/17 10:04 PM  Result Value Ref Range   Glucose-Capillary 158 (H) 65 - 99 mg/dL  Glucose, capillary     Status: Abnormal   Collection Time: 04/12/17  6:46 AM  Result Value Ref Range   Glucose-Capillary 102 (H) 65 - 99 mg/dL  Glucose, capillary     Status: Abnormal   Collection Time: 04/12/17 12:18 PM  Result Value Ref Range   Glucose-Capillary 115 (H) 65 - 99 mg/dL  Glucose, capillary     Status: Abnormal   Collection Time: 04/12/17  4:17 PM  Result Value Ref Range   Glucose-Capillary 218 (H) 65 - 99 mg/dL  Glucose, capillary     Status:  Abnormal   Collection Time: 04/12/17  9:36 PM  Result Value Ref Range   Glucose-Capillary 167 (H) 65 - 99 mg/dL   Comment 1 Notify RN   Glucose, capillary     Status: Abnormal   Collection Time: 04/13/17  6:19 AM  Result Value Ref Range   Glucose-Capillary 133 (H) 65 - 99 mg/dL     HEENT: normal Cardio: irregular and no murmur Resp: CTA B/L and unlabored GI: BS positive and NT, ND Extremity:  Pulses positive and No Edema Skin:   Intact Neuro: Alert/Oriented, Abnormal Sensory oriented to day  but not month or place even with cuing, Abnormal Motor 3- R delt, 4- R triceps, 3- R biceps an grip, Abnormal FMC Ataxic/ dec FMC and Other RLE 3- HF, 4- KE, 3- ADF with pain limitation Musc/Skel:  Other pain over R achilles to palp , no calf swelling or tenderness, pain to palp ~ 15cm above bimalleolar line Gen NAD Reduced sensation in R L5 and S1 dermatome  Assessment/Plan: 1. Functional deficits secondary to Left pontine infarct which require 3+ hours per day of interdisciplinary therapy in a comprehensive inpatient rehab setting. Physiatrist is providing close team supervision and 24 hour management of active medical problems listed  below. Physiatrist and rehab team continue to assess barriers to discharge/monitor patient progress toward functional and medical goals. FIM: Function - Bathing Bathing activity did not occur: N/A Position: Shower Body parts bathed by patient: Right arm, Right lower leg, Left arm, Left lower leg, Chest, Abdomen, Front perineal area, Buttocks, Right upper leg, Left upper leg, Back Body parts bathed by helper: Back Assist Level: Supervision or verbal cues  Function- Upper Body Dressing/Undressing What is the patient wearing?: Pull over shirt/dress Pull over shirt/dress - Perfomed by patient: Thread/unthread right sleeve, Thread/unthread left sleeve, Put head through opening, Pull shirt over trunk Pull over shirt/dress - Perfomed by helper: Thread/unthread right  sleeve, Thread/unthread left sleeve, Put head through opening, Pull shirt over trunk Assist Level: Supervision or verbal cues Function - Lower Body Dressing/Undressing What is the patient wearing?: Pants, Socks, Shoes, Underwear Position: Wheelchair/chair at [employer] Underwear - Performed by patient: Thread/unthread right underwear leg, Thread/unthread left underwear leg, Pull underwear up/down Pants- Performed by patient: Thread/unthread right pants leg, Thread/unthread left pants leg, Pull pants up/down Pants- Performed by helper: Thread/unthread right pants leg, Thread/unthread left pants leg Non-skid slipper socks- Performed by patient: Don/doff left sock Non-skid slipper socks- Performed by helper: Don/doff right sock, Don/doff left sock Socks - Performed by patient: Don/doff left sock, Don/doff right sock Socks - Performed by helper: Don/doff right sock, Don/doff left sock Shoes - Performed by patient: Don/doff right shoe, Don/doff left shoe Shoes - Performed by helper: Don/doff right shoe, Don/doff left shoe TED Hose - Performed by helper: Don/doff right TED hose, Don/doff left TED hose Assist for footwear: Supervision/touching assist Assist for lower body dressing: Supervision or verbal cues  Function - Toileting Toileting activity did not occur: No continent bowel/bladder event Toileting steps completed by patient: Adjust clothing prior to toileting, Performs perineal hygiene, Adjust clothing after toileting Toileting steps completed by helper: Adjust clothing after toileting Toileting Assistive Devices: Grab bar or rail Assist level: Supervision or verbal cues  Function - ArchivistToilet Transfers Toilet transfer assistive device: Grab bar, Walker Assist level to toilet: Supervision or verbal cues Assist level from toilet: Supervision or verbal cues  Function - Chair/bed transfer Chair/bed transfer method: Stand pivot Chair/bed transfer assist level: Supervision or verbal cues Chair/bed  transfer assistive device: Environmental consultantWalker, Armrests  Function - Locomotion: Wheelchair Max wheelchair distance: 50 Assist Level: Moderate assistance (Pt 50 - 74%) Assist Level: Moderate assistance (Pt 50 - 74%) Wheel 150 feet activity did not occur: Safety/medical concerns Turns around,maneuvers to table,bed, and toilet,negotiates 3% grade,maneuvers on rugs and over doorsills: No Function - Locomotion: Ambulation Ambulation activity did not occur: N/A Assistive device: Walker-rolling Max distance: [id number]  Assist level: Supervision or verbal cues Walk 10 feet activity did not occur: N/A Assist level: Supervision or verbal cues Walk 50 feet with 2 turns activity did not occur: N/A Assist level: Supervision or verbal cues Walk 150 feet activity did not occur: N/A Assist level: Supervision or verbal cues Walk 10 feet on uneven surfaces activity did not occur: N/A Assist level: Supervision or verbal cues  Function - Comprehension Comprehension: Auditory Comprehension assist level: Follows basic conversation/direction with no assist  Function - Expression Expression: Verbal Expression assist level: Expresses basic needs/ideas: With no assist  Function - Social Interaction Social Interaction assist level: Interacts appropriately 90% of the time - Needs monitoring or encouragement for participation or interaction.  Function - Problem Solving Problem solving assist level: Solves basic 75 - 89% of the time/requires cueing 10 - 24% of  the time  Function - Memory Memory assist level: Recognizes or recalls 50 - 74% of the time/requires cueing 25 - 49% of the time Patient normally able to recall (first 3 days only): That he or she is in a hospital, Current season  Medical Problem List and Plan: 1.Increased Right-sided weaknesssecondary to acute infarction left pons as well as history of CVA posterior cerebral artery December 2016 with right-sided residual weakness -CIR PT, OT, SLP  plan d/c 12/8 2. DVT Prophylaxis/Anticoagulation: Eliquis 3. Pain Management:Lyrica 150 mg daily, Cymbalta 30 mg twice a day,? Diabetic neuropathy- no symmetric numbness  Voltaren gel -ice, PRAFO boot for support,Air cast able to bear wt per PT -improving                  4. Mood:Provide emotional support 5. Neuropsych: This patientisnot capable of making decisions on herown behalf. Poor orientation will ask neuropsych to eval 6. Skin/Wound Care:Routine skin checks 7. Fluids/Electrolytes/Nutrition:Routine I&O's with follow-up chemistries 8.Diabetes mellitus and peripheral neuropathy. Hemoglobin A1c 8.6. Levemir 30 units twice a day. Check blood sugars before meals and at bedtime CBG (last 3)  Recent Labs    04/12/17 1617 04/12/17 2136 04/13/17 0619  GLUCAP 218* 167* 133*  controlled with Levemir  40U BID, 12/7 metformin  250mg  BID  9.Hypothyroidism. Synthroid 10.Hypertension. Avapro 150 mg daily, Norvasc 5 mg daily, Coreg 6.25 mg twice a day. Vitals:   04/12/17 1330 04/13/17 0502  BP: (!) 115/48 134/75  Pulse: 62 75  Resp: 17 16  Temp: (!) 97.5 F (36.4 C) 97.6 F (36.4 C)  SpO2: 99% 93%  BPs controlled 12/7 11.Hyperlipidemia. Lipitor 12.GERD. Protonix Right   LOS (Days) 10 A FACE TO FACE EVALUATION WAS PERFORMED  Erick Colace 04/13/2017, 8:15 AM

## 2017-04-13 NOTE — Progress Notes (Signed)
Occupational Therapy Discharge Summary  Patient Details  Name: Casey Harrell MRN: [id number] Date of Birth: Aug 19, 1934   Patient has met 12 of 12 long term goals due to improved activity tolerance, improved balance, postural control, functional use of  RIGHT upper extremity, improved attention, improved awareness and improved coordination.  Patient to discharge at overall Supervision level.  Patient's care partner is independent to provide the necessary physical and cognitive assistance at discharge.  Pt's family has been present throughout rehab admission and are aware of pt's cognitive and physical deficits. They report they have planned for paid caregivers as needed to ensure 24 hr assist for pt and her husband.    Recommendation:  Patient will benefit from ongoing skilled OT services in home health setting to continue to advance functional skills in the area of BADL and Reduce care partner burden.  Equipment: Pt has all needed DME from previous rehab admission. Recommending use of RW at d/c. Has built in shower chair  Reasons for discharge: treatment goals met and discharge from hospital  Patient/family agrees with progress made and goals achieved: Yes  OT Discharge Precautions/Restrictions  Precautions Precautions: Fall Required Braces or Orthoses: Other Brace/Splint Other Brace/Splint: has R ankle aircast for support Restrictions Weight Bearing Restrictions: No Vision Baseline Vision/History: Wears glasses Wears Glasses: Reading only Patient Visual Report: No change from baseline Perception  Perception: Within Functional Limits Praxis Praxis: Intact Cognition Overall Cognitive Status: Impaired/Different from baseline Arousal/Alertness: Awake/alert Orientation Level: Oriented X4 Attention: Selective Focused Attention: Appears intact Sustained Attention: Appears intact Selective Attention: Appears intact Memory: Impaired Memory Impairment: Decreased short term  memory;Decreased recall of new information Decreased Short Term Memory: Verbal basic;Functional basic Awareness: Impaired Awareness Impairment: Intellectual impairment Problem Solving: Impaired Problem Solving Impairment: Verbal complex;Functional complex Safety/Judgment: Impaired Comments: Decreased awareness of deficits Sensation Sensation Light Touch: Appears Intact Proprioception: Appears Intact Coordination Gross Motor Movements are Fluid and Coordinated: No Fine Motor Movements are Fluid and Coordinated: No Coordination and Movement Description: mild ataxia in RUE and RLE 9 Hole Peg Test: R: 82min13sec; 74min1sec; 1 min 18sec      L: 50.9sec; 38.08sec; 45.42 Motor  Motor Motor: Hemiplegia Motor - Discharge Observations: mild R hemiplegia Mobility  Bed Mobility Bed Mobility: Rolling Right;Rolling Left;Supine to Sit;Sit to Supine Rolling Right: 5: Supervision Rolling Left: 5: Supervision Supine to Sit: 5: Supervision Sit to Supine: 5: Supervision Transfers Sit to Stand: 5: Supervision Sit to Stand Details: Verbal cues for safe use of DME/AE Sit to Stand Details (indicate cue type and reason): with RW  Trunk/Postural Assessment  Cervical Assessment Cervical Assessment: Exceptions to [facility] Thoracic Assessment Thoracic Assessment: Exceptions to WFL(Kyphotic) Lumbar Assessment Lumbar Assessment: Exceptions to WFL(Posterior pelvic tilt) Postural Control Postural Control: Within Functional Limits  Balance Balance Balance Assessed: Yes Standardized Balance Assessment Standardized Balance Assessment: Berg Balance Test Berg Balance Test Sit to Stand: Able to stand  independently using hands Standing Unsupported: Able to stand 2 minutes with supervision Sitting with Back Unsupported but Feet Supported on Floor or Stool: Able to sit safely and securely 2 minutes Stand to Sit: Controls descent by using hands Transfers: Able to transfer safely, definite need of hands Standing  Unsupported with Eyes Closed: Able to stand 10 seconds with supervision Standing Ubsupported with Feet Together: Able to place feet together independently but unable to hold for 30 seconds From Standing, Reach Forward with Outstretched Arm: Can reach forward >12 cm safely (5") From Standing Position, Pick up Object from Floor: Able to pick  up shoe, needs supervision From Standing Position, Turn to Look Behind Over each Shoulder: Looks behind one side only/other side shows less weight shift Turn 360 Degrees: Needs close supervision or verbal cueing Standing Unsupported, Alternately Place Feet on Step/Stool: Able to complete >2 steps/needs minimal assist Standing Unsupported, One Foot in Front: Needs help to step but can hold 15 seconds Standing on One Leg: Tries to lift leg/unable to hold 3 seconds but remains standing independently Total Score: 34 Static Sitting Balance Static Sitting - Balance Support: Feet supported Static Sitting - Level of Assistance: 6: Modified independent (Device/Increase time) Dynamic Sitting Balance Dynamic Sitting - Balance Support: During functional activity;Feet supported Dynamic Sitting - Level of Assistance: 6: Modified independent (Device/Increase time) Dynamic Sitting - Balance Activities: Lateral lean/weight shifting Static Standing Balance Static Standing - Balance Support: During functional activity;Right upper extremity supported;Left upper extremity supported Static Standing - Level of Assistance: 5: Stand by assistance Static Standing - Comment/# of Minutes: Standing to complete toileting and LB dressing tasks Dynamic Standing Balance Dynamic Standing - Balance Support: During functional activity;Left upper extremity supported;Right upper extremity supported Dynamic Standing - Level of Assistance: 5: Stand by assistance Dynamic Standing - Comments: with UE support on RW  Extremity/Trunk Assessment RUE Assessment RUE Assessment: Exceptions to [facility] RUE  Strength RUE Overall Strength Comments: ~95 degrees shoulder flexion; 3/5 shoulder flexion strength; 4/5 all other major muscle groups LUE Assessment LUE Assessment: Within Functional Limits   See Function Navigator for Current Functional Status.  Zion Lint L 04/13/2017, 1:11 PM

## 2017-04-13 NOTE — Discharge Summary (Signed)
NAMSantina Evans:  Berrones, Shrika                    ACCOUNT NO.:  [id number]  MEDICAL RECORD NO.:  [id number]  LOCATION:  4W20C                        FACILITY:  [facility]  PHYSICIAN:  Erick ColaceAndrew E. Kirsteins, M.D.DATE OF BIRTH:  1934/12/15  DATE OF ADMISSION:  04/03/2017 DATE OF DISCHARGE:  04/14/2017                              DISCHARGE SUMMARY   DISCHARGE DIAGNOSES: 1. Left pons cerebrovascular accident with history of posterior     cerebral artery cerebrovascular accident. 2. Eliquis for deep vein thrombosis prophylaxis. 3. Pain management. 4. Diabetes mellitus. 5. Peripheral neuropathy. 6. Hypothyroidism. 7. Hypertension. 8. Hyperlipidemia. 9. Gastroesophageal reflux disease.  This is a 82 year old right-handed female with history of hypertension, diabetes mellitus, atrial fibrillation, maintained on Eliquis as well as CVA involving posterior cerebral artery with right-sided weakness in December 2016, receiving inpatient rehab services.  She lives with spouse, independent with assistive device prior to admission.  Presented on March 30, 2017, with increasing right-sided weakness and difficulty in speech.  Cranial CT scan unremarkable, multifocal old infarctions, the patient did not receive tPA, troponin negative.  MRI showed small acute infarct of left pons measuring 8 x 3 mm, no associated hemorrhage.  MRA, motion degraded, showed moderate to severe stenosis of the supraclinoid, right ICA.  No occlusion or stenosis. Echocardiogram with ejection fraction of 60%.  No wall motion abnormalities.  Neurology consulted.  Placed on aspirin in addition to Eliquis.  The patient was admitted for comprehensive rehab program.  PAST MEDICAL HISTORY:  See discharge diagnoses.  SOCIAL HISTORY:  Lives with spouse.  Used an assistive device prior to admission.  FUNCTIONAL STATUS UPON ADMISSION TO REHAB SERVICES:  Max assist 10 feet rolling walker, minimal assist sit to side lying, moderate assist  supine to sit, mod to max assist activities of daily living.  PHYSICAL EXAMINATION:  VITAL SIGNS:  Blood pressure 167/66, pulse 66, temperature 98, and respirations 20. GENERAL:  This was an alert female, in no acute distress.  Speech mildly dysarthric, but intelligible. HEENT:  Right facial droop.  EOMs intact. NECK:  Supple.  Nontender.  No JVD. CARDIAC:  Rate controlled. ABDOMEN:  Soft, nontender.  Good bowel sounds. LUNGS:  Clear to auscultation without wheeze.  REHABILITATION HOSPITAL COURSE:  The patient was admitted to Inpatient Rehab Services.  Therapies initiated on a 3-hour daily basis consisting of physical therapy, occupational therapy, speech therapy, and rehabilitation nursing.  The following issues were addressed during the patient's rehabilitation stay.  Pertaining to Ms. Rivadeneira's left pons CVA with history of posterior cerebral artery infarct, remained stable.  She would follow up with Neurology Services.  She remained on Eliquis as well as aspirin.  Pain management with the use of Lyrica and Cymbalta. Voltaren gel was added for some right ankle discomfort, no swelling, noted palpable pedal pulses.  Blood pressures controlled with Avapro, Norvasc, and Coreg.  She would follow up with her primary MD.  Noted diabetes mellitus, peripheral neuropathy, hemoglobin A1c of 8.6.  She remained on her Levemir insulin.  Low-dose Glucophage had been resumed. Lipitor for hyperlipidemia.  The patient received weekly collaborative interdisciplinary team conferences to discuss estimated length of stay, family teaching, any barriers  to discharge.  She was ambulating in a simulated environment 150 feet x2, rolling walker, supervision, minimal cues.  Working with dynamic sitting balance.  Bed mobility sit to supine and prone.  Modified independence supervision.  Gather belongings for activities of daily living and homemaking.  Needing some assistance for lower body dressing.  She could  stand with contact guard to pull up her pants, ambulate to the bathroom, complete toileting tasks with guard assist.  Tolerating a regular diet.  She could communicate her needs. Full family teaching completed and plan discharge to home.  DISCHARGE MEDICATIONS: 1. Norvasc 5 mg p.o. daily. 2. Eliquis 5 mg p.o. b.i.d. 3. Aspirin 81 mg p.o. daily. 4. Lipitor 40 mg p.o. daily. 5. Coreg 6.25 mg p.o. b.i.d. 6. Voltaren gel 4 times daily to affected area. 7. Cymbalta 30 mg p.o. b.i.d. 8. Levemir 40 units b.i.d. 9. NovoLog insulin 8 units t.i.d. with meals. 10.Avapro 150 mg p.o. daily. 11.Synthroid 50 mcg daily. 12.Glucophage 250 mg p.o. b.i.d. 13.Protonix 40 mg p.o. b.i.d. 14.Lyrica 150 mg p.o. daily. 15.Antivert 25 mg 3 times daily as needed. 16.Continue home nebulizers.  DIET:  Her diet was a diabetic diet.  FAMILY HISTORY:  She would follow up with Dr. Claudette LawsAndrew Kirsteins at the Outpatient Rehab Service office as advised, Dr. Roda ShuttersXu, Neurology Services, 6 weeks; Dr. Evelena PeatBruce Burchette, Medical Management.     Mariam Dollaraniel Angiulli, P.A.   ______________________________ Erick ColaceAndrew E. Kirsteins, M.D.    DA/MEDQ  D:  04/13/2017  T:  04/13/2017  Job:  [id number]  cc:   Dr. Cecille AverXu Bruce Burchette, M.D.

## 2017-04-13 NOTE — Progress Notes (Signed)
Speech Language Pathology Discharge Summary  Patient Details  Name: Casey Harrell MRN: [id number] Date of Birth: June 01, 1934  Today's Date: 04/13/2017 SLP Individual Time: 1300-1400 SLP Individual Time Calculation (min): 60 min   Skilled Therapeutic Interventions:  Skilled treatment session focused on cognition goals. SLP facilitated session by completing caregiver education with husband. Husband states that they will have support in the home and feels confident they can meet pt's cognitive needs. He states that he feels her "abilites (cognitive)" are at baseline given history dx of dementia. Pt left upright in wheelchair with friends present.     Patient has met 6 of 6 long term goals.  Patient to discharge at [facility] level.    Clinical Impression/Discharge Summary:   Pt has made good progress in skilled ST sessions and as a result she has met 6 of 6 LTGs. Pt continues to require Min A for some cognitive tasks such as awareness and recall of new information. Some of these deficits might be related to baseline impairments of dementia. Unclear at this time.   Care Partner:  Caregiver Able to Provide Assistance: Yes  Type of Caregiver Assistance: Cognitive  Recommendation:  Home Health SLP;24 hour supervision/assistance  Rationale for SLP Follow Up: Maximize cognitive function and independence;Reduce caregiver burden(Within home environment)   Equipment:     Reasons for discharge: Treatment goals met;Discharged from hospital   Patient/Family Agrees with Progress Made and Goals Achieved: Yes   Function:  Eating Eating   Modified Consistency Diet: No Eating Assist Level: More than reasonable amount of time           Cognition Comprehension Comprehension assist level: Follows basic conversation/direction with no assist  Expression   Expression assist level: Expresses basic needs/ideas: With no assist  Social Interaction Social Interaction assist level: Interacts  appropriately 90% of the time - Needs monitoring or encouragement for participation or interaction.;Interacts appropriately with others with medication or extra time (anti-anxiety, antidepressant).  Problem Solving Problem solving assist level: Solves basic 75 - 89% of the time/requires cueing 10 - 24% of the time  Memory Memory assist level: Recognizes or recalls 50 - 74% of the time/requires cueing 25 - 49% of the time   Daxx Tiggs 04/13/2017, 1:28 PM

## 2017-04-13 NOTE — Discharge Summary (Signed)
Discharge summary job [id number]

## 2017-04-14 LAB — GLUCOSE, CAPILLARY: Glucose-Capillary: 119 mg/dL — ABNORMAL HIGH (ref 65–99)

## 2017-04-14 NOTE — Progress Notes (Signed)
Subjective/Complaints:  No issues overnite  ROS- no CP SOB, abd pain, neg N/V Objective: Vital Signs: Blood pressure (!) 159/63, pulse 61, temperature 97.9 F (36.6 C), temperature source Oral, resp. rate 16, height 5\' 1"  (1.549 m), weight 64 kg (141 lb 1.5 oz), SpO2 97 %. No results found. Results for orders placed or performed during the hospital encounter of 04/03/17 (from the past 72 hour(s))  Glucose, capillary     Status: Abnormal   Collection Time: 04/11/17 11:44 AM  Result Value Ref Range   Glucose-Capillary 189 (H) 65 - 99 mg/dL  Glucose, capillary     Status: Abnormal   Collection Time: 04/11/17  4:16 PM  Result Value Ref Range   Glucose-Capillary 194 (H) 65 - 99 mg/dL  Glucose, capillary     Status: Abnormal   Collection Time: 04/11/17 10:04 PM  Result Value Ref Range   Glucose-Capillary 158 (H) 65 - 99 mg/dL  Glucose, capillary     Status: Abnormal   Collection Time: 04/12/17  6:46 AM  Result Value Ref Range   Glucose-Capillary 102 (H) 65 - 99 mg/dL  Glucose, capillary     Status: Abnormal   Collection Time: 04/12/17 12:18 PM  Result Value Ref Range   Glucose-Capillary 115 (H) 65 - 99 mg/dL  Glucose, capillary     Status: Abnormal   Collection Time: 04/12/17  4:17 PM  Result Value Ref Range   Glucose-Capillary 218 (H) 65 - 99 mg/dL  Glucose, capillary     Status: Abnormal   Collection Time: 04/12/17  9:36 PM  Result Value Ref Range   Glucose-Capillary 167 (H) 65 - 99 mg/dL   Comment 1 Notify RN   Glucose, capillary     Status: Abnormal   Collection Time: 04/13/17  6:19 AM  Result Value Ref Range   Glucose-Capillary 133 (H) 65 - 99 mg/dL  Glucose, capillary     Status: Abnormal   Collection Time: 04/13/17 12:10 PM  Result Value Ref Range   Glucose-Capillary 131 (H) 65 - 99 mg/dL  Glucose, capillary     Status: Abnormal   Collection Time: 04/13/17  4:44 PM  Result Value Ref Range   Glucose-Capillary 162 (H) 65 - 99 mg/dL  Glucose, capillary     Status:  Abnormal   Collection Time: 04/13/17  9:45 PM  Result Value Ref Range   Glucose-Capillary 240 (H) 65 - 99 mg/dL  Glucose, capillary     Status: Abnormal   Collection Time: 04/14/17  6:31 AM  Result Value Ref Range   Glucose-Capillary 119 (H) 65 - 99 mg/dL     HEENT: normal Cardio: irregular and no murmur Resp: CTA B/L and unlabored GI: BS positive and NT, ND Extremity:  Pulses positive and No Edema Skin:   Intact Neuro: Alert/Oriented, Abnormal Sensory oriented to day  but not month or place even with cuing, Abnormal Motor 3- R delt, 4- R triceps, 3- R biceps an grip, Abnormal FMC Ataxic/ dec FMC and Other RLE 3- HF, 4- KE, 3- ADF with pain limitation Musc/Skel:  Other pain over R achilles to palp , no calf swelling or tenderness, pain to palp ~ 15cm above bimalleolar line Gen NAD   Assessment/Plan: 1. Functional deficits secondary to Left pontine infarct  Stable for D/C today F/u PCP in 3-4 weeks F/u PM&R 2 weeks See D/C summary See D/C instructions FIM: Function - Bathing Bathing activity did not occur: N/A Position: Shower Body parts bathed by patient: Right arm, Right  lower leg, Left arm, Left lower leg, Chest, Abdomen, Front perineal area, Buttocks, Right upper leg, Left upper leg, Back Body parts bathed by helper: Back Assist Level: Supervision or verbal cues  Function- Upper Body Dressing/Undressing What is the patient wearing?: Pull over shirt/dress Pull over shirt/dress - Perfomed by patient: Thread/unthread right sleeve, Thread/unthread left sleeve, Put head through opening, Pull shirt over trunk Pull over shirt/dress - Perfomed by helper: Thread/unthread right sleeve, Thread/unthread left sleeve, Put head through opening, Pull shirt over trunk Assist Level: Supervision or verbal cues Function - Lower Body Dressing/Undressing What is the patient wearing?: Pants, Socks, Shoes, Underwear Position: Other (comment)(sitting on toilet) Underwear - Performed by patient:  Thread/unthread right underwear leg, Thread/unthread left underwear leg, Pull underwear up/down Pants- Performed by patient: Thread/unthread right pants leg, Thread/unthread left pants leg, Pull pants up/down Pants- Performed by helper: Thread/unthread right pants leg, Thread/unthread left pants leg Non-skid slipper socks- Performed by patient: Don/doff left sock Non-skid slipper socks- Performed by helper: Don/doff right sock, Don/doff left sock Socks - Performed by patient: Don/doff left sock, Don/doff right sock Socks - Performed by helper: Don/doff right sock, Don/doff left sock Shoes - Performed by patient: Don/doff right shoe, Don/doff left shoe(elastic shoelace) Shoes - Performed by helper: Don/doff right shoe, Don/doff left shoe TED Hose - Performed by helper: Don/doff right TED hose, Don/doff left TED hose Assist for footwear: Supervision/touching assist Assist for lower body dressing: Supervision or verbal cues  Function - Toileting Toileting activity did not occur: No continent bowel/bladder event Toileting steps completed by patient: Adjust clothing prior to toileting, Performs perineal hygiene, Adjust clothing after toileting Toileting steps completed by helper: Adjust clothing after toileting Toileting Assistive Devices: Grab bar or rail Assist level: Supervision or verbal cues  Function - ArchivistToilet Transfers Toilet transfer assistive device: Grab bar, Walker Assist level to toilet: Supervision or verbal cues Assist level from toilet: Supervision or verbal cues  Function - Chair/bed transfer Chair/bed transfer method: Stand pivot Chair/bed transfer assist level: Supervision or verbal cues Chair/bed transfer assistive device: Environmental consultantWalker, Armrests  Function - Locomotion: Wheelchair Will patient use wheelchair at discharge?: No Type: Manual Max wheelchair distance: 14400ft  Assist Level: Supervision or verbal cues Assist Level: Supervision or verbal cues Wheel 150 feet activity  did not occur: Refused Turns around,maneuvers to table,bed, and toilet,negotiates 3% grade,maneuvers on rugs and over doorsills: No Function - Locomotion: Ambulation Ambulation activity did not occur: N/A Assistive device: Walker-rolling Max distance: 13150ft  Assist level: Supervision or verbal cues Walk 10 feet activity did not occur: N/A Assist level: Supervision or verbal cues Walk 50 feet with 2 turns activity did not occur: N/A Assist level: Supervision or verbal cues Walk 150 feet activity did not occur: N/A Assist level: Supervision or verbal cues Walk 10 feet on uneven surfaces activity did not occur: N/A Assist level: Supervision or verbal cues  Function - Comprehension Comprehension: Auditory Comprehension assist level: Follows basic conversation/direction with no assist  Function - Expression Expression: Verbal Expression assist level: Expresses basic needs/ideas: With no assist  Function - Social Interaction Social Interaction assist level: Interacts appropriately 90% of the time - Needs monitoring or encouragement for participation or interaction.  Function - Problem Solving Problem solving assist level: Solves basic 75 - 89% of the time/requires cueing 10 - 24% of the time  Function - Memory Memory assist level: Recognizes or recalls 50 - 74% of the time/requires cueing 25 - 49% of the time Patient normally able to recall (first  3 days only): That he or she is in a hospital, Current season, Staff names and faces  Medical Problem List and Plan: 1.Increased Right-sided weaknesssecondary to acute infarction left pons as well as history of CVA posterior cerebral artery December 2016 with right-sided residual weakness -CIR PT, OT, SLP plan d/c 12/8 2. DVT Prophylaxis/Anticoagulation: Eliquis 3. Pain Management:Lyrica 150 mg daily, Cymbalta 30 mg twice a day,? Diabetic neuropathy- no symmetric numbness  Voltaren gel -ice, PRAFO boot for  support,Air cast able to bear wt per PT -improving                  4. Mood:Provide emotional support 5. Neuropsych: This patientisnot capable of making decisions on herown behalf. Poor orientation will ask neuropsych to eval 6. Skin/Wound Care:Routine skin checks 7. Fluids/Electrolytes/Nutrition:Routine I&O's with follow-up chemistries 8.Diabetes mellitus and peripheral neuropathy. Hemoglobin A1c 8.6. Levemir 30 units twice a day. Check blood sugars before meals and at bedtime CBG (last 3)  Recent Labs    04/13/17 1644 04/13/17 2145 04/14/17 0631  GLUCAP 162* 240* 119*  controlled with Levemir  40U BID, 12/7 metformin  250mg  BID  9.Hypothyroidism. Synthroid 10.Hypertension. Avapro 150 mg daily, Norvasc 5 mg daily, Coreg 6.25 mg twice a day. Vitals:   04/13/17 1625 04/14/17 0500  BP: 131/61 (!) 159/63  Pulse: 62 61  Resp: 16 16  Temp: (!) 97.3 F (36.3 C) 97.9 F (36.6 C)  SpO2: 94% 97%  BPs controlled 12/8 11.Hyperlipidemia. Lipitor 12.GERD. Protonix Right   LOS (Days) 11 A FACE TO FACE EVALUATION WAS PERFORMED  Erick Colace 04/14/2017, 9:29 AM

## 2017-04-16 ENCOUNTER — Telehealth: Payer: Self-pay | Admitting: *Deleted

## 2017-04-16 NOTE — Telephone Encounter (Signed)
Unable to reach patient at time of TCM Call. Left message for patient to return call when available.  

## 2017-04-17 DIAGNOSIS — E039 Hypothyroidism, unspecified: Secondary | ICD-10-CM | POA: Diagnosis not present

## 2017-04-17 DIAGNOSIS — K219 Gastro-esophageal reflux disease without esophagitis: Secondary | ICD-10-CM | POA: Diagnosis not present

## 2017-04-17 DIAGNOSIS — E785 Hyperlipidemia, unspecified: Secondary | ICD-10-CM | POA: Diagnosis not present

## 2017-04-17 DIAGNOSIS — I10 Essential (primary) hypertension: Secondary | ICD-10-CM | POA: Diagnosis not present

## 2017-04-17 DIAGNOSIS — E1142 Type 2 diabetes mellitus with diabetic polyneuropathy: Secondary | ICD-10-CM | POA: Diagnosis not present

## 2017-04-18 NOTE — Telephone Encounter (Signed)
Unable to reach patient at time of TCM Call.  Left message for patient to return call when available. No TCM attempt made on 04/17/17 due to office being closed for inclement weather.

## 2017-04-19 DIAGNOSIS — E039 Hypothyroidism, unspecified: Secondary | ICD-10-CM | POA: Diagnosis not present

## 2017-04-19 DIAGNOSIS — I10 Essential (primary) hypertension: Secondary | ICD-10-CM | POA: Diagnosis not present

## 2017-04-19 DIAGNOSIS — E1142 Type 2 diabetes mellitus with diabetic polyneuropathy: Secondary | ICD-10-CM | POA: Diagnosis not present

## 2017-04-19 DIAGNOSIS — E785 Hyperlipidemia, unspecified: Secondary | ICD-10-CM | POA: Diagnosis not present

## 2017-04-19 DIAGNOSIS — K219 Gastro-esophageal reflux disease without esophagitis: Secondary | ICD-10-CM | POA: Diagnosis not present

## 2017-04-20 DIAGNOSIS — E785 Hyperlipidemia, unspecified: Secondary | ICD-10-CM | POA: Diagnosis not present

## 2017-04-20 DIAGNOSIS — E1142 Type 2 diabetes mellitus with diabetic polyneuropathy: Secondary | ICD-10-CM | POA: Diagnosis not present

## 2017-04-20 DIAGNOSIS — I10 Essential (primary) hypertension: Secondary | ICD-10-CM | POA: Diagnosis not present

## 2017-04-20 DIAGNOSIS — K219 Gastro-esophageal reflux disease without esophagitis: Secondary | ICD-10-CM | POA: Diagnosis not present

## 2017-04-20 DIAGNOSIS — E039 Hypothyroidism, unspecified: Secondary | ICD-10-CM | POA: Diagnosis not present

## 2017-04-23 ENCOUNTER — Encounter: Payer: Self-pay | Admitting: Family Medicine

## 2017-04-23 ENCOUNTER — Ambulatory Visit: Payer: Medicare Other | Admitting: Family Medicine

## 2017-04-23 VITALS — BP 120/80 | HR 60 | Temp 97.6°F | Wt 138.5 lb

## 2017-04-23 DIAGNOSIS — N183 Chronic kidney disease, stage 3 unspecified: Secondary | ICD-10-CM

## 2017-04-23 DIAGNOSIS — Z8673 Personal history of transient ischemic attack (TIA), and cerebral infarction without residual deficits: Secondary | ICD-10-CM | POA: Diagnosis not present

## 2017-04-23 DIAGNOSIS — I10 Essential (primary) hypertension: Secondary | ICD-10-CM

## 2017-04-23 DIAGNOSIS — E785 Hyperlipidemia, unspecified: Secondary | ICD-10-CM | POA: Diagnosis not present

## 2017-04-23 DIAGNOSIS — E1159 Type 2 diabetes mellitus with other circulatory complications: Secondary | ICD-10-CM

## 2017-04-23 DIAGNOSIS — E1142 Type 2 diabetes mellitus with diabetic polyneuropathy: Secondary | ICD-10-CM | POA: Diagnosis not present

## 2017-04-23 DIAGNOSIS — Z794 Long term (current) use of insulin: Secondary | ICD-10-CM

## 2017-04-23 DIAGNOSIS — K219 Gastro-esophageal reflux disease without esophagitis: Secondary | ICD-10-CM | POA: Diagnosis not present

## 2017-04-23 DIAGNOSIS — I639 Cerebral infarction, unspecified: Secondary | ICD-10-CM

## 2017-04-23 DIAGNOSIS — E039 Hypothyroidism, unspecified: Secondary | ICD-10-CM | POA: Diagnosis not present

## 2017-04-23 NOTE — Progress Notes (Signed)
Subjective:     Patient ID: Casey Harrell, female   DOB: 12/07/1934, 82 y.o.   MRN: [id number]  HPI Patient seen for hospital follow-up. She has multiple chronic problems including history of cerebrovascular disease, peripheral vascular disease, type 2 diabetes, atrial fibrillation, hypertension, dyslipidemia, sleep apnea, GERD, hypothyroidism, diabetic neuropathy, recurrent BPPV, chronic kidney disease.   She was admitted on 03/29/17 after presenting with some increasing right-sided weakness and dysarthria. Apparently had mostly weakness in her right lower extremity. There was some question of dysphagia as well. Vital signs were stable on admission. Head CT showed no acute changes. MRI showed small acute infarct in the left pons measuring 8 x 3 mm. No associated hemorrhage or mass effect. MR angiogram revealed moderate to severe stenosis of the supraclinoid right ICA. Echocardiogram normal LV function.  Patient has history of atrial fibrillation and was rate controlled on carvedilol. She is on chronic anticoagulation and aspirin was added to her regimen but patient did not realize she was to stay on aspirin after discharge. Her blood pressure was elevated and amlodipine was added for better blood pressure control.  Type 2 diabetes with history of poor control. She is currently on Levemir 40 units twice daily and takes NovoLog 8 units 3 times daily with meals. History of poor compliance with diet. A1c was 8.6%. LDL cholesterol 57.  Ambulates with walker. Her husband has stage IV lung cancer. They have very supportive family. Her daughter accompanies her today.  Patient was admitted for rehabilitation stay and was there from November 27 through December 8. She has home physical therapy at this point. She feels her strength is back to baseline at this time.  Past Medical History:  Diagnosis Date  . ASTHMA 07/15/2008  . COPD (chronic obstructive pulmonary disease) (HCC)   . DIABETES-TYPE 2 07/15/2008  .  DIABETIC PERIPHERAL NEUROPATHY 09/02/2009  . GERD 07/15/2008  . HYPERTENSION 07/15/2008  . Normal cardiac stress test    low risk Nuc 2009, Feb 2011  . Paroxysmal atrial fibrillation (HCC)   . PVD (peripheral vascular disease) (HCC) 06/18/09   RCE  . Stroke (HCC)   . Vertigo    Past Surgical History:  Procedure Laterality Date  . ABDOMINAL HYSTERECTOMY  1971  . APPENDECTOMY  1971  . CAROTID ENDARTERECTOMY  06/18/09   RCE  . CESAREAN SECTION    . colectomy and colostomy    . FLEXIBLE SIGMOIDOSCOPY     diverticulitis  . FOOT SURGERY     right foot X 2  . LAPAROTOMY     X 3 with adhesions lysis  . TONSILLECTOMY    . TUBAL LIGATION      reports that  has never smoked. she has never used smokeless tobacco. She reports that she does not drink alcohol or use drugs. family history includes Arthritis in her mother; Asthma in her sister; Cancer in her paternal grandfather; Cancer (age of onset: 88) in her brother; Celiac disease in her sister; Diabetes in her mother; Heart disease in her brother, father, mother, sister, and sister; Stroke in her maternal grandfather. Allergies  Allergen Reactions  . Doxycycline Hyclate Nausea And Vomiting  . Morphine Sulfate Other (See Comments)    REACTION: hallucinations  . Iohexol Hives     Code: HIVES, Desc: pt. states she breaks out in hives 06/15/08   . Moxifloxacin Other (See Comments)    REACTION: unknown     Review of Systems  Constitutional: Negative for appetite change, chills, fatigue, fever  and unexpected weight change.  Eyes: Negative for visual disturbance.  Respiratory: Negative for cough, chest tightness, shortness of breath and wheezing.   Cardiovascular: Negative for chest pain, palpitations and leg swelling.  Gastrointestinal: Negative for abdominal pain.  Endocrine: Negative for polydipsia and polyuria.  Neurological: Negative for dizziness, seizures, syncope, weakness, light-headedness and headaches.       Objective:    Physical Exam  Constitutional: She is oriented to person, place, and time. She appears well-developed and well-nourished.  Cardiovascular: Normal rate.  Pulmonary/Chest: Effort normal and breath sounds normal. No respiratory distress. She has no wheezes. She has no rales.  Musculoskeletal: She exhibits no edema.  Neurological: She is alert and oriented to person, place, and time. No cranial nerve deficit.  She has some mild decreased strength with grip on the right compared to the left and some weakness with right knee extension compared to the left side       Assessment:      #1 long-standing history of cerebrovascular disease with recent acute CVA left pons. Minimal residual deficit   #2 atrial fibrillation history rate controlled and on anticoagulation  #3 hypertension stable and at goal   #4 type 2 diabetes with history of poor control. Poor compliance with diet. Continue Levemir and NovoLog at mealtime .  She is strongly encouraged to bring in some blood sugars from home to review to help titrate medication  #5 hypothyroidism  #6 history of recurrent depression  #7 history of chronic kidney disease stage II      Plan:     -She is advised to start aspirin 81 mg daily which was advised at discharge.   Patient misunderstood. Continue Eliquis 5 mg twice daily  -She is encouraged to keep follow-up with neurology as scheduled in February  -Continue home PT  -Walker use at all times for ambulation to reduce risk of falls   [name] MD [facility]

## 2017-04-23 NOTE — Patient Instructions (Addendum)
Get back on aspirin 81 mg daily- in addition to the Eliquis. We will set up referral to neurology- Dr Roda ShuttersXu

## 2017-04-24 DIAGNOSIS — K219 Gastro-esophageal reflux disease without esophagitis: Secondary | ICD-10-CM | POA: Diagnosis not present

## 2017-04-24 DIAGNOSIS — E1142 Type 2 diabetes mellitus with diabetic polyneuropathy: Secondary | ICD-10-CM | POA: Diagnosis not present

## 2017-04-24 DIAGNOSIS — E785 Hyperlipidemia, unspecified: Secondary | ICD-10-CM | POA: Diagnosis not present

## 2017-04-24 DIAGNOSIS — E039 Hypothyroidism, unspecified: Secondary | ICD-10-CM | POA: Diagnosis not present

## 2017-04-24 DIAGNOSIS — I10 Essential (primary) hypertension: Secondary | ICD-10-CM | POA: Diagnosis not present

## 2017-04-26 DIAGNOSIS — I10 Essential (primary) hypertension: Secondary | ICD-10-CM | POA: Diagnosis not present

## 2017-04-26 DIAGNOSIS — E1142 Type 2 diabetes mellitus with diabetic polyneuropathy: Secondary | ICD-10-CM | POA: Diagnosis not present

## 2017-04-26 DIAGNOSIS — E785 Hyperlipidemia, unspecified: Secondary | ICD-10-CM | POA: Diagnosis not present

## 2017-04-26 DIAGNOSIS — K219 Gastro-esophageal reflux disease without esophagitis: Secondary | ICD-10-CM | POA: Diagnosis not present

## 2017-04-26 DIAGNOSIS — E039 Hypothyroidism, unspecified: Secondary | ICD-10-CM | POA: Diagnosis not present

## 2017-04-27 ENCOUNTER — Telehealth: Payer: Self-pay | Admitting: Family Medicine

## 2017-04-27 DIAGNOSIS — E039 Hypothyroidism, unspecified: Secondary | ICD-10-CM | POA: Diagnosis not present

## 2017-04-27 DIAGNOSIS — I10 Essential (primary) hypertension: Secondary | ICD-10-CM | POA: Diagnosis not present

## 2017-04-27 DIAGNOSIS — E1142 Type 2 diabetes mellitus with diabetic polyneuropathy: Secondary | ICD-10-CM | POA: Diagnosis not present

## 2017-04-27 DIAGNOSIS — K219 Gastro-esophageal reflux disease without esophagitis: Secondary | ICD-10-CM | POA: Diagnosis not present

## 2017-04-27 DIAGNOSIS — E785 Hyperlipidemia, unspecified: Secondary | ICD-10-CM | POA: Diagnosis not present

## 2017-04-27 NOTE — Telephone Encounter (Signed)
Aurther Lofterry, RN with [facility] called in to inform us that the pt fell this morning while carrying her bedside commode to the bathroom to empty it.  She has had a stroke with right sided weakness.   She normally wears a brace on her right ankle however she did not have it on when she fell this morning.  Per Aurther Lofterry the nurse the right ankle is tender but there is no bruising or swelling noted.   The pt feels she does not need to be seen.   She is walking on the ankle.  I routed a note to Dr. Lucie LeatherBurchette's nurse pool making them aware of the fall.

## 2017-05-03 DIAGNOSIS — I10 Essential (primary) hypertension: Secondary | ICD-10-CM | POA: Diagnosis not present

## 2017-05-03 DIAGNOSIS — K219 Gastro-esophageal reflux disease without esophagitis: Secondary | ICD-10-CM | POA: Diagnosis not present

## 2017-05-03 DIAGNOSIS — E039 Hypothyroidism, unspecified: Secondary | ICD-10-CM | POA: Diagnosis not present

## 2017-05-03 DIAGNOSIS — E785 Hyperlipidemia, unspecified: Secondary | ICD-10-CM | POA: Diagnosis not present

## 2017-05-03 DIAGNOSIS — E1142 Type 2 diabetes mellitus with diabetic polyneuropathy: Secondary | ICD-10-CM | POA: Diagnosis not present

## 2017-05-04 ENCOUNTER — Telehealth: Payer: Self-pay | Admitting: Family Medicine

## 2017-05-04 DIAGNOSIS — E785 Hyperlipidemia, unspecified: Secondary | ICD-10-CM | POA: Diagnosis not present

## 2017-05-04 DIAGNOSIS — K219 Gastro-esophageal reflux disease without esophagitis: Secondary | ICD-10-CM | POA: Diagnosis not present

## 2017-05-04 DIAGNOSIS — I10 Essential (primary) hypertension: Secondary | ICD-10-CM | POA: Diagnosis not present

## 2017-05-04 DIAGNOSIS — E039 Hypothyroidism, unspecified: Secondary | ICD-10-CM | POA: Diagnosis not present

## 2017-05-04 DIAGNOSIS — E1142 Type 2 diabetes mellitus with diabetic polyneuropathy: Secondary | ICD-10-CM | POA: Diagnosis not present

## 2017-05-04 NOTE — Telephone Encounter (Signed)
Copied from CRM [id number]. Topic: Inquiry >> May 04, 2017  2:53 PM Crist InfanteHarrald, Kathy J wrote: Reason for CRM: [facility]rie [facility] states in entering pt's scripts, the baby aspirin recently added gives an interaction warning with topical cream diclofenac sodium (VOLTAREN) 1 % GEL for the pt. FYI  Can we close this chart?

## 2017-05-11 ENCOUNTER — Telehealth: Payer: Self-pay | Admitting: Family Medicine

## 2017-05-11 ENCOUNTER — Other Ambulatory Visit: Payer: Self-pay | Admitting: Family Medicine

## 2017-05-11 DIAGNOSIS — K219 Gastro-esophageal reflux disease without esophagitis: Secondary | ICD-10-CM | POA: Diagnosis not present

## 2017-05-11 DIAGNOSIS — I10 Essential (primary) hypertension: Secondary | ICD-10-CM | POA: Diagnosis not present

## 2017-05-11 DIAGNOSIS — E1142 Type 2 diabetes mellitus with diabetic polyneuropathy: Secondary | ICD-10-CM | POA: Diagnosis not present

## 2017-05-11 DIAGNOSIS — E785 Hyperlipidemia, unspecified: Secondary | ICD-10-CM | POA: Diagnosis not present

## 2017-05-11 DIAGNOSIS — E039 Hypothyroidism, unspecified: Secondary | ICD-10-CM | POA: Diagnosis not present

## 2017-05-11 MED ORDER — MECLIZINE HCL 12.5 MG PO TABS: 12.5000 mg | ORAL_TABLET | Freq: Three times a day (TID) | ORAL | 0 refills | Status: DC | PRN

## 2017-05-11 NOTE — Telephone Encounter (Signed)
Pt's daughter called in to follow up on refill request. She said that pt will be out over the week, I did provide her with the current status.

## 2017-05-11 NOTE — Telephone Encounter (Signed)
Rx done. 

## 2017-05-11 NOTE — Telephone Encounter (Signed)
Copied from CRM [id number]. Topic: Quick Communication - See Telephone Encounter >> May 11, 2017 10:26 AM Waymon AmatoBurton, Donna F wrote: CRM for notification. See Telephone encounter for:  Pt daughter is calling to say that patient is needing a refill of her metformin and meclizine which she states that both were given in the hospital last time Best number is [phone] daughter and the pharmacy is [facility]    05/11/17.

## 2017-05-11 NOTE — Telephone Encounter (Signed)
Would take the 25 mg Meclizine off her list.  We can call in limited Meclizine 12.5 mg one po q 8 hours prn vertigo #30 with no refills.

## 2017-05-11 NOTE — Telephone Encounter (Signed)
Patient's daughter called in for prescription refills of Metformin and Meclizine, both were last filled by the hospital PA, will need new prescriptions sent, Last OV 04/23/17.

## 2017-05-11 NOTE — Telephone Encounter (Signed)
Rx sent for Metformin.  Request sent to Dr Caryl NeverBurchette for refill approval of Meclizine.

## 2017-05-14 DIAGNOSIS — I10 Essential (primary) hypertension: Secondary | ICD-10-CM | POA: Diagnosis not present

## 2017-05-14 DIAGNOSIS — K219 Gastro-esophageal reflux disease without esophagitis: Secondary | ICD-10-CM | POA: Diagnosis not present

## 2017-05-14 DIAGNOSIS — E039 Hypothyroidism, unspecified: Secondary | ICD-10-CM | POA: Diagnosis not present

## 2017-05-14 DIAGNOSIS — E785 Hyperlipidemia, unspecified: Secondary | ICD-10-CM | POA: Diagnosis not present

## 2017-05-14 DIAGNOSIS — E1142 Type 2 diabetes mellitus with diabetic polyneuropathy: Secondary | ICD-10-CM | POA: Diagnosis not present

## 2017-05-17 ENCOUNTER — Encounter: Payer: Medicare Other | Attending: Family Medicine

## 2017-05-17 ENCOUNTER — Inpatient Hospital Stay: Payer: Medicare Other | Admitting: Physical Medicine & Rehabilitation

## 2017-05-28 ENCOUNTER — Telehealth: Payer: Self-pay | Admitting: *Deleted

## 2017-05-28 NOTE — Telephone Encounter (Signed)
A refill request for diabetes supplies has come from [facility].  Would patient like supplies sent there or to her local pharmacy?  CRM created

## 2017-06-03 ENCOUNTER — Other Ambulatory Visit: Payer: Self-pay | Admitting: Family Medicine

## 2017-06-14 ENCOUNTER — Other Ambulatory Visit: Payer: Self-pay | Admitting: Family Medicine

## 2017-06-26 ENCOUNTER — Ambulatory Visit: Payer: Medicare Other | Admitting: Neurology

## 2017-06-28 ENCOUNTER — Other Ambulatory Visit: Payer: Self-pay | Admitting: Family Medicine

## 2017-07-11 ENCOUNTER — Ambulatory Visit: Payer: Medicare Other | Admitting: Neurology

## 2017-07-11 ENCOUNTER — Telehealth: Payer: Self-pay | Admitting: Family Medicine

## 2017-07-11 MED ORDER — GLUCOSE BLOOD VI STRP: ORAL_STRIP | 12 refills | Status: DC

## 2017-07-11 NOTE — Telephone Encounter (Signed)
The patient needs Blood Glucose Monitoring Suppl (ACCU-CHEK AVIVA)    She needs the directions changed to twice a day instead of once a day. The patient is running out of strips

## 2017-07-11 NOTE — Telephone Encounter (Signed)
Refill sent.

## 2017-07-12 ENCOUNTER — Encounter: Payer: Self-pay | Admitting: Neurology

## 2017-07-23 ENCOUNTER — Ambulatory Visit: Payer: Medicare Other | Admitting: Family Medicine

## 2017-08-07 ENCOUNTER — Encounter: Payer: Self-pay | Admitting: Family Medicine

## 2017-08-07 ENCOUNTER — Ambulatory Visit: Payer: Medicare Other | Admitting: Family Medicine

## 2017-08-07 VITALS — BP 102/64 | HR 60 | Temp 98.3°F

## 2017-08-07 DIAGNOSIS — R6889 Other general symptoms and signs: Secondary | ICD-10-CM

## 2017-08-07 DIAGNOSIS — F4321 Adjustment disorder with depressed mood: Secondary | ICD-10-CM | POA: Diagnosis not present

## 2017-08-07 DIAGNOSIS — R059 Cough, unspecified: Secondary | ICD-10-CM

## 2017-08-07 DIAGNOSIS — R05 Cough: Secondary | ICD-10-CM

## 2017-08-07 DIAGNOSIS — G4700 Insomnia, unspecified: Secondary | ICD-10-CM | POA: Diagnosis not present

## 2017-08-07 LAB — POC INFLUENZA A&B (BINAX/QUICKVUE)
Influenza A, POC: NEGATIVE
Influenza B, POC: NEGATIVE

## 2017-08-07 MED ORDER — CEFUROXIME AXETIL 250 MG PO TABS: 250.0000 mg | ORAL_TABLET | Freq: Two times a day (BID) | ORAL | 0 refills | Status: DC

## 2017-08-07 NOTE — Patient Instructions (Addendum)
Stay well hydrated  Continue with the Mucinex  Follow up for any fever or increased shortness of breath.    Insomnia Insomnia is a sleep disorder that makes it difficult to fall asleep or to stay asleep. Insomnia can cause tiredness (fatigue), low energy, difficulty concentrating, mood swings, and poor performance at work or school. There are three different ways to classify insomnia:  Difficulty falling asleep.  Difficulty staying asleep.  Waking up too early in the morning.  Any type of insomnia can be long-term (chronic) or short-term (acute). Both are common. Short-term insomnia usually lasts for three months or less. Chronic insomnia occurs at least three times a week for longer than three months. What are the causes? Insomnia may be caused by another condition, situation, or substance, such as:  Anxiety.  Certain medicines.  Gastroesophageal reflux disease (GERD) or other gastrointestinal conditions.  Asthma or other breathing conditions.  Restless legs syndrome, sleep apnea, or other sleep disorders.  Chronic pain.  Menopause. This may include hot flashes.  Stroke.  Abuse of alcohol, tobacco, or illegal drugs.  Depression.  Caffeine.  Neurological disorders, such as Alzheimer disease.  An overactive thyroid (hyperthyroidism).  The cause of insomnia may not be known. What increases the risk? Risk factors for insomnia include:  Gender. Women are more commonly affected than men.  Age. Insomnia is more common as you get older.  Stress. This may involve your professional or personal life.  Income. Insomnia is more common in people with lower income.  Lack of exercise.  Irregular work schedule or night shifts.  Traveling between different time zones.  What are the signs or symptoms? If you have insomnia, trouble falling asleep or trouble staying asleep is the main symptom. This may lead to other symptoms, such as:  Feeling fatigued.  Feeling  nervous about going to sleep.  Not feeling rested in the morning.  Having trouble concentrating.  Feeling irritable, anxious, or depressed.  How is this treated? Treatment for insomnia depends on the cause. If your insomnia is caused by an underlying condition, treatment will focus on addressing the condition. Treatment may also include:  Medicines to help you sleep.  Counseling or therapy.  Lifestyle adjustments.  Follow these instructions at home:  Take medicines only as directed by your health care provider.  Keep regular sleeping and waking hours. Avoid naps.  Keep a sleep diary to help you and your health care provider figure out what could be causing your insomnia. Include: ? When you sleep. ? When you wake up during the night. ? How well you sleep. ? How rested you feel the next day. ? Any side effects of medicines you are taking. ? What you eat and drink.  Make your bedroom a comfortable place where it is easy to fall asleep: ? Put up shades or special blackout curtains to block light from outside. ? Use a white noise machine to block noise. ? Keep the temperature cool.  Exercise regularly as directed by your health care provider. Avoid exercising right before bedtime.  Use relaxation techniques to manage stress. Ask your health care provider to suggest some techniques that may work well for you. These may include: ? Breathing exercises. ? Routines to release muscle tension. ? Visualizing peaceful scenes.  Cut back on alcohol, caffeinated beverages, and cigarettes, especially close to bedtime. These can disrupt your sleep.  Do not overeat or eat spicy foods right before bedtime. This can lead to digestive discomfort that can make it  hard for you to sleep.  Limit screen use before bedtime. This includes: ? Watching TV. ? Using your smartphone, tablet, and computer.  Stick to a routine. This can help you fall asleep faster. Try to do a quiet activity, brush  your teeth, and go to bed at the same time each night.  Get out of bed if you are still awake after 15 minutes of trying to sleep. Keep the lights down, but try reading or doing a quiet activity. When you feel sleepy, go back to bed.  Make sure that you drive carefully. Avoid driving if you feel very sleepy.  Keep all follow-up appointments as directed by your health care provider. This is important. Contact a health care provider if:  You are tired throughout the day or have trouble in your daily routine due to sleepiness.  You continue to have sleep problems or your sleep problems get worse. Get help right away if:  You have serious thoughts about hurting yourself or someone else. This information is not intended to replace advice given to you by your health care provider. Make sure you discuss any questions you have with your health care provider. Document Released: 04/21/2000 Document Revised: 09/24/2015 Document Reviewed: 01/23/2014 Elsevier Interactive Patient Education  [employer].

## 2017-08-07 NOTE — Progress Notes (Signed)
Subjective:     Patient ID: Casey Harrell, female   DOB: 08-03-1934, 83 y.o.   MRN: [id number]  HPI Patient has chronic problems of type 2 diabetes, peripheral vascular disease, cerebrovascular disease, hypertension, history of small bowel obstruction, GERD, hypothyroidism, chronic kidney disease, history depression. She's here today with 2 day history of some chills without definite fever and cough. She's had some body aches. Her daughter had similar symptoms last week. Patient's husband passed away last week from complications of lung cancer. They had been married 64 years. This has naturally been very difficult for her.  She has type 2 diabetes. On insulin. No recent hypoglycemia. She has very supportive children and family. Last A1c 8.6%. Not monitoring sugar much past few days. She's had poor appetite and very little food intake but is keeping down fluids.  Daughter states she's had insomnia for some time. She's tried over-the-counter medications without much improvement.  Some hangover drowsiness with Benadryl. She takes Lyrica for neuropathy symptoms and is also on Cymbalta. Occasional daytime naps. No alcohol use  Past Medical History:  Diagnosis Date  . ASTHMA 07/15/2008  . COPD (chronic obstructive pulmonary disease) (HCC)   . DIABETES-TYPE 2 07/15/2008  . DIABETIC PERIPHERAL NEUROPATHY 09/02/2009  . GERD 07/15/2008  . HYPERTENSION 07/15/2008  . Normal cardiac stress test    low risk Nuc 2009, Feb 2011  . Paroxysmal atrial fibrillation (HCC)   . PVD (peripheral vascular disease) (HCC) 06/18/09   RCE  . Stroke (HCC)   . Vertigo    Past Surgical History:  Procedure Laterality Date  . ABDOMINAL HYSTERECTOMY  1971  . APPENDECTOMY  1971  . CAROTID ENDARTERECTOMY  06/18/09   RCE  . CESAREAN SECTION    . colectomy and colostomy    . FLEXIBLE SIGMOIDOSCOPY     diverticulitis  . FOOT SURGERY     right foot X 2  . LAPAROTOMY     X 3 with adhesions lysis  . TONSILLECTOMY    . TUBAL  LIGATION      reports that she has never smoked. She has never used smokeless tobacco. She reports that she does not drink alcohol or use drugs. family history includes Arthritis in her mother; Asthma in her sister; Cancer in her paternal grandfather; Cancer (age of onset: 5) in her brother; Celiac disease in her sister; Diabetes in her mother; Heart disease in her brother, father, mother, sister, and sister; Stroke in her maternal grandfather. Allergies  Allergen Reactions  . Doxycycline Hyclate Nausea And Vomiting  . Morphine Sulfate Other (See Comments)    REACTION: hallucinations  . Iohexol Hives     Code: HIVES, Desc: pt. states she breaks out in hives 06/15/08   . Moxifloxacin Other (See Comments)    REACTION: unknown     Review of Systems  Constitutional: Positive for chills and fatigue. Negative for fever.  HENT: Positive for sore throat.   Respiratory: Positive for cough. Negative for wheezing.   Cardiovascular: Negative for chest pain, palpitations and leg swelling.  Genitourinary: Negative for dysuria.  Neurological: Negative for dizziness.  Psychiatric/Behavioral: Positive for sleep disturbance.       Objective:   Physical Exam  Constitutional: She appears well-developed and well-nourished.  HENT:  Right Ear: External ear normal.  Left Ear: External ear normal.  Mouth/Throat: Oropharynx is clear and moist.  Neck: Neck supple.  Cardiovascular: Normal rate.  Pulmonary/Chest: Effort normal and breath sounds normal. No respiratory distress. She has no wheezes. She  has no rales.  Musculoskeletal: She exhibits no edema.  Lymphadenopathy:    She has no cervical adenopathy.       Assessment:     #1 acute respiratory illness. Nonfocal exam and no respiratory distress.  High risk for complication with her history of asthma, COPD, diabetes  #2 grief reaction related to loss of husband   #3 insomnia    Plan:     -Ceftin 250 mg twice daily for 7 days -Monitor  blood glucose closely during illness -Avoid dehydration with adequate fluids -Handout given on sleep hygiene. We discussed risk of sedative medications including increased risk of falls. Avoid regular daytime napping as much as possible -Offered counseling for grief reaction. Will discuss further at follow-up in one week follow-up immediately for any increasing fever or shortness of breath or other concerns.  Kristian CoveyBruce W Burchette MD [facility]

## 2017-08-25 ENCOUNTER — Other Ambulatory Visit: Payer: Self-pay | Admitting: Family Medicine

## 2017-08-28 ENCOUNTER — Ambulatory Visit: Payer: Medicare Other | Admitting: Family Medicine

## 2017-08-28 ENCOUNTER — Other Ambulatory Visit: Payer: Self-pay

## 2017-08-28 ENCOUNTER — Encounter: Payer: Self-pay | Admitting: Family Medicine

## 2017-08-28 VITALS — BP 112/68 | HR 53 | Temp 97.5°F | Resp 14 | Ht 61.0 in | Wt 133.7 lb

## 2017-08-28 DIAGNOSIS — E785 Hyperlipidemia, unspecified: Secondary | ICD-10-CM | POA: Diagnosis not present

## 2017-08-28 DIAGNOSIS — I10 Essential (primary) hypertension: Secondary | ICD-10-CM | POA: Diagnosis not present

## 2017-08-28 DIAGNOSIS — Z794 Long term (current) use of insulin: Secondary | ICD-10-CM

## 2017-08-28 DIAGNOSIS — E039 Hypothyroidism, unspecified: Secondary | ICD-10-CM

## 2017-08-28 DIAGNOSIS — E1159 Type 2 diabetes mellitus with other circulatory complications: Secondary | ICD-10-CM

## 2017-08-28 DIAGNOSIS — Z2089 Contact with and (suspected) exposure to other communicable diseases: Secondary | ICD-10-CM

## 2017-08-28 LAB — BASIC METABOLIC PANEL
BUN: 24 mg/dL — ABNORMAL HIGH (ref 6–23)
CO2: 27 mEq/L (ref 19–32)
Calcium: 9.9 mg/dL (ref 8.4–10.5)
Chloride: 98 mEq/L (ref 96–112)
Creatinine, Ser: 1.11 mg/dL (ref 0.40–1.20)
GFR: 50.23 mL/min — ABNORMAL LOW (ref >60.00)
Glucose, Bld: 264 mg/dL — ABNORMAL HIGH (ref 70–99)
Potassium: 3.9 mEq/L (ref 3.5–5.1)
Sodium: 135 mEq/L (ref 135–145)

## 2017-08-28 LAB — TSH: TSH: 2.82 u[IU]/mL (ref 0.35–4.50)

## 2017-08-28 LAB — POCT GLYCOSYLATED HEMOGLOBIN (HGB A1C): Hemoglobin A1C: 9.2

## 2017-08-28 MED ORDER — TRAZODONE HCL 50 MG PO TABS: 25.0000 mg | ORAL_TABLET | Freq: Every evening | ORAL | 3 refills | Status: DC | PRN

## 2017-08-28 NOTE — Progress Notes (Signed)
Subjective:     Patient ID: Casey Harrell, female   DOB: May 18, 1934, 83 y.o.   MRN: [id number]  HPI Patient seen for medical follow-up.  Just lost her husband a few weeks ago. She has very good support from family. Her major issue is insomnia. She's been taking a couple Benadryl at night and still having great difficulty sleeping. Already takes Lyrica. We gave first handout on sleep hygiene previously. She avoids caffeine.  Type 2 diabetes. History of poor control. Currently on Levemir 40 units twice daily and takes NovoLog usually 8 to 10 units before meals. Postprandial blood sugars been consistently around 240 or higher. She has history of frequent UTIs in the past. Not a good candidate for SGLT 2 medication  Blood pressure well controlled and medications reviewed. No recent dizziness. No recent falls.  Hypothyroidism on replacement. Overdue for labs.  Past Medical History:  Diagnosis Date  . ASTHMA 07/15/2008  . COPD (chronic obstructive pulmonary disease) (HCC)   . DIABETES-TYPE 2 07/15/2008  . DIABETIC PERIPHERAL NEUROPATHY 09/02/2009  . GERD 07/15/2008  . HYPERTENSION 07/15/2008  . Normal cardiac stress test    low risk Nuc 2009, Feb 2011  . Paroxysmal atrial fibrillation (HCC)   . PVD (peripheral vascular disease) (HCC) 06/18/09   RCE  . Stroke (HCC)   . Vertigo    Past Surgical History:  Procedure Laterality Date  . ABDOMINAL HYSTERECTOMY  1971  . APPENDECTOMY  1971  . CAROTID ENDARTERECTOMY  06/18/09   RCE  . CESAREAN SECTION    . colectomy and colostomy    . FLEXIBLE SIGMOIDOSCOPY     diverticulitis  . FOOT SURGERY     right foot X 2  . LAPAROTOMY     X 3 with adhesions lysis  . TONSILLECTOMY    . TUBAL LIGATION      reports that she has never smoked. She has never used smokeless tobacco. She reports that she does not drink alcohol or use drugs. family history includes Arthritis in her mother; Asthma in her sister; Cancer in her paternal grandfather; Cancer (age of  onset: 36) in her brother; Celiac disease in her sister; Diabetes in her mother; Heart disease in her brother, father, mother, sister, and sister; Stroke in her maternal grandfather. Allergies  Allergen Reactions  . Doxycycline Hyclate Nausea And Vomiting  . Morphine Sulfate Other (See Comments)    REACTION: hallucinations  . Iohexol Hives     Code: HIVES, Desc: pt. states she breaks out in hives 06/15/08   . Moxifloxacin Other (See Comments)    REACTION: unknown     Review of Systems  Constitutional: Negative for fatigue.  Eyes: Negative for visual disturbance.  Respiratory: Negative for cough, chest tightness, shortness of breath and wheezing.   Cardiovascular: Negative for chest pain, palpitations and leg swelling.  Endocrine: Negative for polydipsia and polyuria.  Neurological: Negative for dizziness, seizures, syncope, weakness, light-headedness and headaches.  Psychiatric/Behavioral: Positive for dysphoric mood and sleep disturbance. Negative for agitation and confusion.       Objective:   Physical Exam  Constitutional: She appears well-developed and well-nourished.  Eyes: Pupils are equal, round, and reactive to light.  Neck: Neck supple. No JVD present. No thyromegaly present.  Cardiovascular: Normal rate and regular rhythm. Exam reveals no gallop.  Pulmonary/Chest: Effort normal and breath sounds normal. No respiratory distress. She has no wheezes. She has no rales.  Musculoskeletal: She exhibits no edema.  Neurological: She is alert.  Assessment:     #1 grief reaction related to recent loss of husband  #2 insomnia  #3 type 2 diabetes poorly controlled with A1c today 9.2%  #4 hypothyroidism  #5 dyslipidemia    Plan:     -Recheck labs with basic metabolic panel and TSH. Her lipids were checked last fall -We have recommended checking 2 hour postprandial blood sugars regularly and if consistently > 180 increase her NovoLog 2 units every few days until  around 180- or less. She'll continue with current dose of Levemir -We have offered counseling for grief reaction this point she declines -We discussed insomnia. She's not getting relief with over-the-counter medications getting very little sleep. We recommend a short-term trial trazodone 50 mg one half to one tablet daily at bedtime  [name] MD [facility]

## 2017-08-28 NOTE — Progress Notes (Signed)
A1c: 9.2

## 2017-08-28 NOTE — Patient Instructions (Signed)
Check some 2 hour after meal blood sugars and if consistently > 180, increase the Humalog up 2 units every few days until around 180 or less.

## 2017-09-11 ENCOUNTER — Telehealth: Payer: Self-pay | Admitting: Family Medicine

## 2017-09-11 DIAGNOSIS — R300 Dysuria: Secondary | ICD-10-CM

## 2017-09-11 NOTE — Telephone Encounter (Signed)
Given increasing issues of resistance, I really think it is better to get urine dip and culture- if possible to make sure we are on right track.

## 2017-09-11 NOTE — Telephone Encounter (Signed)
Copied from CRM [id number]. Topic: Quick Communication - See Telephone Encounter >> Sep 11, 2017  9:59 AM Lorrine Kin, NT wrote: CRM for notification. See Telephone encounter for: 09/11/17. Patient's daughter, Vic Ripper, states that her mother has a UTI that is lingering. States that she has tried AZO and cranberry juice. States that her mother just feels lousy and wanted to see if something could be sent in before making an appointment. CB# [phone]  [id number] - [place], [place] - [address]

## 2017-09-11 NOTE — Telephone Encounter (Signed)
Spoke with daughter.  Lab appointment and order placed.

## 2017-09-12 ENCOUNTER — Other Ambulatory Visit: Payer: Self-pay

## 2017-09-25 ENCOUNTER — Other Ambulatory Visit: Payer: Self-pay | Admitting: Family Medicine

## 2017-10-15 ENCOUNTER — Ambulatory Visit: Payer: Medicare Other | Admitting: Family Medicine

## 2017-10-15 VITALS — BP 140/70 | HR 66 | Temp 97.6°F | Wt 133.9 lb

## 2017-10-15 DIAGNOSIS — Z794 Long term (current) use of insulin: Secondary | ICD-10-CM | POA: Diagnosis not present

## 2017-10-15 DIAGNOSIS — F4321 Adjustment disorder with depressed mood: Secondary | ICD-10-CM | POA: Diagnosis not present

## 2017-10-15 DIAGNOSIS — E1159 Type 2 diabetes mellitus with other circulatory complications: Secondary | ICD-10-CM | POA: Diagnosis not present

## 2017-10-15 DIAGNOSIS — R81 Glycosuria: Secondary | ICD-10-CM | POA: Diagnosis not present

## 2017-10-15 DIAGNOSIS — R350 Frequency of micturition: Secondary | ICD-10-CM | POA: Diagnosis not present

## 2017-10-15 LAB — POCT URINALYSIS DIPSTICK
Bilirubin, UA: NEGATIVE
Blood, UA: NEGATIVE
Glucose, UA: POSITIVE — AB
Ketones, UA: NEGATIVE
Leukocytes, UA: NEGATIVE
Nitrite, UA: NEGATIVE
Protein, UA: POSITIVE — AB
Spec Grav, UA: 1.02 (ref 1.010–1.025)
Urobilinogen, UA: 0.2 E.U./dL
pH, UA: 5 (ref 5.0–8.0)

## 2017-10-15 LAB — POCT GLUCOSE (DEVICE FOR HOME USE): POC Glucose: 258 mg/dl — AB (ref 70–99)

## 2017-10-15 MED ORDER — LORAZEPAM 0.5 MG PO TABS: 0.5000 mg | ORAL_TABLET | Freq: Three times a day (TID) | ORAL | 1 refills | Status: DC | PRN

## 2017-10-15 NOTE — Patient Instructions (Signed)

## 2017-10-15 NOTE — Progress Notes (Signed)
Patient seen with grief reaction. Her husband passed away from lung cancer back in March. Patient seemed to be coping fairly well initially but has had some increasing problems in recent weeks. Daughter states she has episodes of severe depression with crying and severe anxiety.  Having some difficulty sleeping.  She has very supportive family with daughter and son helping significantly. She has listed on her medication Cymbalta 30 mg twice daily but not clear she is actually taking this. She also takes Lyrica for neuropathy symptoms. No suicidal ideation  Multiple other chronic problems including type 2 diabetes. Patient has been eating sporadically and not checking blood sugars regularly.  Past Medical History:  Diagnosis Date  . ASTHMA 07/15/2008  . COPD (chronic obstructive pulmonary disease) (HCC)   . DIABETES-TYPE 2 07/15/2008  . DIABETIC PERIPHERAL NEUROPATHY 09/02/2009  . GERD 07/15/2008  . HYPERTENSION 07/15/2008  . Normal cardiac stress test    low risk Nuc 2009, Feb 2011  . Paroxysmal atrial fibrillation (HCC)   . PVD (peripheral vascular disease) (HCC) 06/18/09   RCE  . Stroke (HCC)   . Vertigo    Past Surgical History:  Procedure Laterality Date  . ABDOMINAL HYSTERECTOMY  1971  . APPENDECTOMY  1971  . CAROTID ENDARTERECTOMY  06/18/09   RCE  . CESAREAN SECTION    . colectomy and colostomy    . FLEXIBLE SIGMOIDOSCOPY     diverticulitis  . FOOT SURGERY     right foot X 2  . LAPAROTOMY     X 3 with adhesions lysis  . TONSILLECTOMY    . TUBAL LIGATION      reports that she has never smoked. She has never used smokeless tobacco. She reports that she does not drink alcohol or use drugs. family history includes Arthritis in her mother; Asthma in her sister; Cancer in her paternal grandfather; Cancer (age of onset: 4260) in her brother; Celiac disease in her sister; Diabetes in her mother; Heart disease in her brother, father, mother, sister, and sister; Stroke in her maternal  grandfather. Allergies  Allergen Reactions  . Doxycycline Hyclate Nausea And Vomiting  . Morphine Sulfate Other (See Comments)    REACTION: hallucinations  . Iohexol Hives     Code: HIVES, Desc: pt. states she breaks out in hives 06/15/08   . Moxifloxacin Other (See Comments)    REACTION: unknown   Review of systems:  Very poor sleep. No chest pain. No dyspnea. No fevers or chills. She's had some urine frequency but no burning with urination.  Physical exam  Patient is alert crying frequently during exam but consolable. Chest is clear to auscultation Heart regular rhythm and rate Extremities no edema  Assessment and plan  #1 grief reaction-we discussed referral for counseling but at this point he wished to wait. We've asked that family confirm whether she's taking Cymbalta. If not consider getting back on that versus another antidepressant. We agreed to limited lorazepam 0.5 mg one half tablet to 1 tablet every 8 hours only as needed for severe anxiety symptoms. We explained this could cause some increased sedation and also some potential increased fall risk. Daughter agrees to oversee medication and to use very sparingly  #2 type 2 diabetes. History of poor control.  Patient is scheduled for follow-up in July and reassess A1c then  #3 urine frequency. Urine dipstick reveals glucose but no leukocytes or nitrites.  Kristian CoveyBruce W Burchette MD [facility]

## 2017-10-17 IMAGING — CR DG ABDOMEN 2V
2 series · 2 of 2 positions shown · non-contrast
Comparison: May 09, 2015

CLINICAL DATA: Left lower quadrant pain.  Recent bowel obstruction

EXAM:
ABDOMEN - 2 VIEW

[abdomen erect]
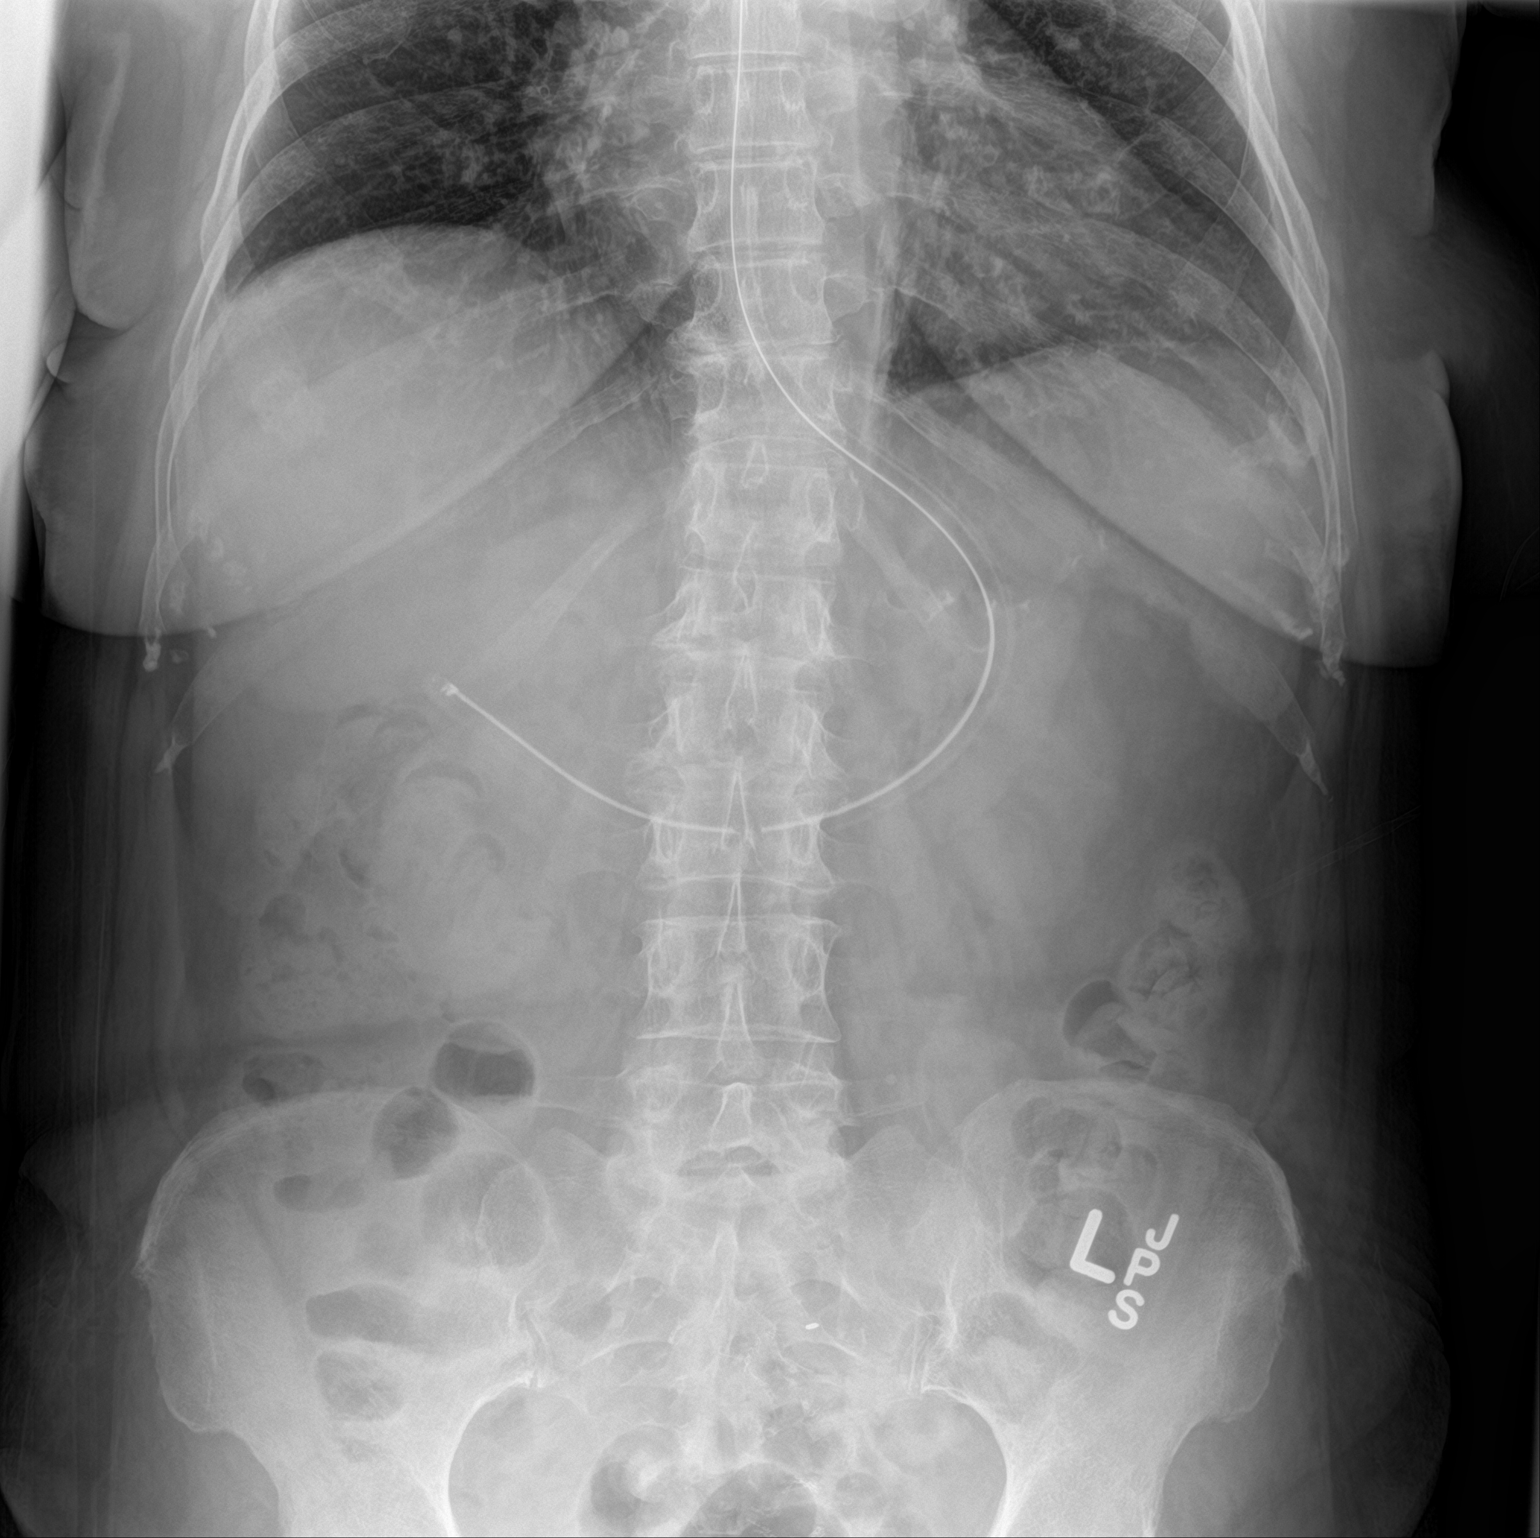

[abdomen supine]
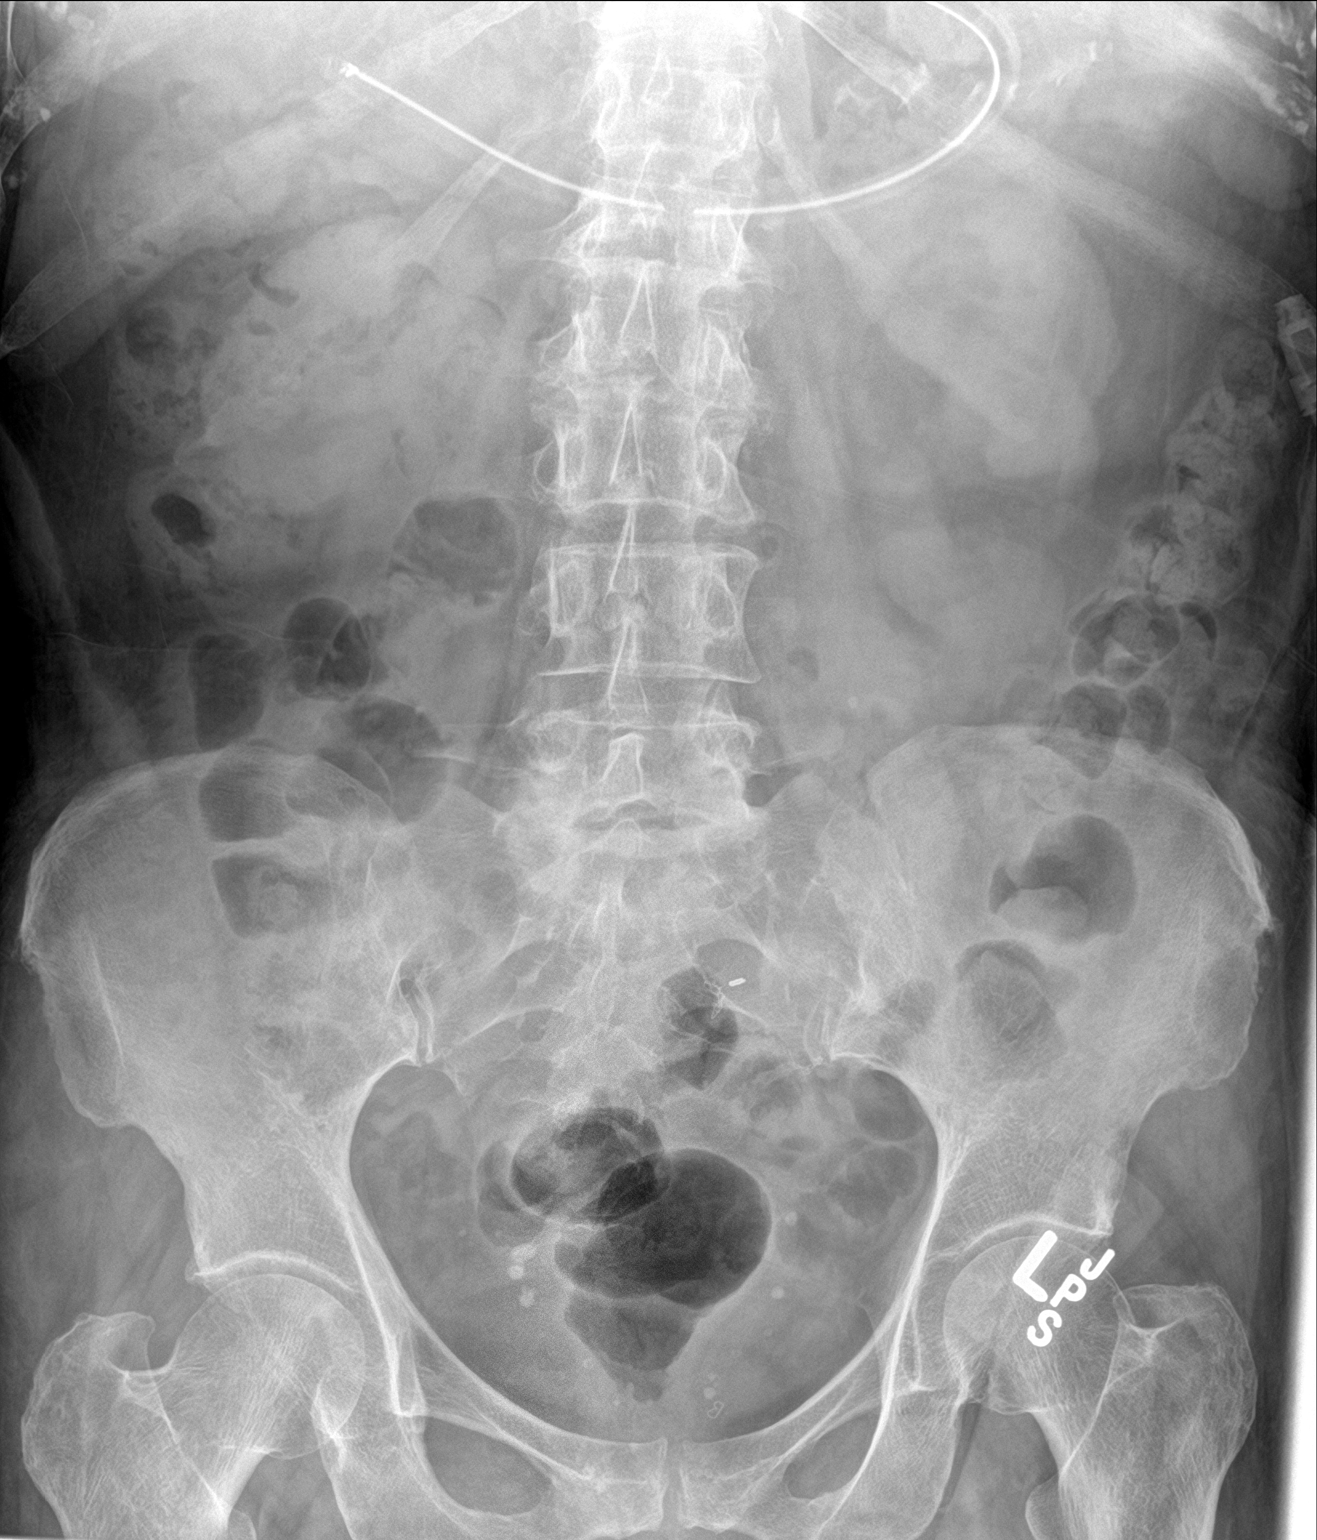

[2 of 2 positions shown; findings below may reference images not displayed]

FINDINGS: Supine and upright images obtained. Nasogastric tube tip and side
port are in the distal stomach. There is fairly diffuse stool
throughout colon. There is no bowel dilatation or air-fluid level
suggesting obstruction. No free air. Lung bases clear. There are
phleboliths in the pelvis.
IMPRESSION: Nasogastric tube tip and side port in stomach. Bowel gas pattern
currently unremarkable without apparent obstruction or free air.

## 2017-10-22 ENCOUNTER — Other Ambulatory Visit: Payer: Self-pay | Admitting: Family Medicine

## 2017-10-24 ENCOUNTER — Other Ambulatory Visit: Payer: Self-pay | Admitting: Family Medicine

## 2017-10-25 NOTE — Telephone Encounter (Signed)
Last OV 10/15/2017   Sent to PCP to advise

## 2017-10-26 NOTE — Telephone Encounter (Signed)
Decline.  Would rather avoid her being on this for risk of side effects.

## 2017-11-09 ENCOUNTER — Other Ambulatory Visit: Payer: Self-pay | Admitting: Family Medicine

## 2017-11-13 ENCOUNTER — Telehealth: Payer: Self-pay | Admitting: *Deleted

## 2017-11-13 NOTE — Telephone Encounter (Signed)
Left message on machine for daughter, Vic Ripperammy Isley, to see how the patient is feeling.  CRM created

## 2017-11-13 NOTE — Telephone Encounter (Signed)
[facility] Requesting refill of  Promethazine.  This is a historical medication.  Okay to refill?  Directions please?

## 2017-11-13 NOTE — Telephone Encounter (Signed)
If she is having nausea, I recommend Zofran 4 mg ODT one every 8 hours prn #20 with no refills..  Do not recommend promethazine secondary to increased risks of side effects.

## 2017-11-14 MED ORDER — ONDANSETRON 4 MG PO TBDP: 4.0000 mg | ORAL_TABLET | Freq: Three times a day (TID) | ORAL | 0 refills | Status: DC | PRN

## 2017-11-14 NOTE — Telephone Encounter (Signed)
Patient's daughter Casey Harrell called and advised of note below by Dr. Caryl NeverBurchette on 11/13/17, she verbalized understanding. She says the medication was not at the pharmacy yesterday when she checked and she says the patient is doing a little better today. I advised the medication will be sent in today and the pharmacy was verified below.  [id number] [place], [place] - [phone] (Phone) [phone] (Fax)

## 2017-11-14 NOTE — Telephone Encounter (Signed)
zofran sent to pharmacy. 

## 2017-11-27 ENCOUNTER — Ambulatory Visit: Payer: Medicare Other | Admitting: Family Medicine

## 2017-12-04 ENCOUNTER — Ambulatory Visit: Payer: Medicare Other | Admitting: Family Medicine

## 2017-12-04 DIAGNOSIS — Z0289 Encounter for other administrative examinations: Secondary | ICD-10-CM

## 2017-12-25 ENCOUNTER — Other Ambulatory Visit: Payer: Self-pay | Admitting: Family Medicine

## 2017-12-31 ENCOUNTER — Telehealth: Payer: Self-pay | Admitting: Family Medicine

## 2017-12-31 NOTE — Telephone Encounter (Signed)
Attempted to call but no answer.  Will try again at another time

## 2017-12-31 NOTE — Telephone Encounter (Signed)
We really need to see her first.  High risk of falls and confusion with benzos.  I am concerned about adverse risks without discussing with her first.

## 2017-12-31 NOTE — Telephone Encounter (Signed)
Patient has taken Ativan?

## 2017-12-31 NOTE — Telephone Encounter (Signed)
Attempted to call to receive clarification of the name of the nerve medication the pt was on previously but no answer at this time. Pt's son asking if pt could be prescribed enough of the medication until appt on 01/03/18.

## 2017-12-31 NOTE — Telephone Encounter (Signed)
Copied from CRM [id number]. Topic: Quick Communication - See Telephone Encounter >> Dec 31, 2017  2:06 PM Waymon AmatoBurton, Donna F wrote: Pt son is calling to see if Dr. Caryl NeverBurchette would consider putting pt back on nerve medication and calling in just enough to get her thru till her ppt on 01/03/18   Best [phone]  [facility]

## 2018-01-03 ENCOUNTER — Encounter: Payer: Self-pay | Admitting: Family Medicine

## 2018-01-03 ENCOUNTER — Ambulatory Visit (INDEPENDENT_AMBULATORY_CARE_PROVIDER_SITE_OTHER): Payer: Medicare Other | Admitting: Family Medicine

## 2018-01-03 VITALS — BP 140/74 | HR 79 | Temp 97.8°F | Ht 61.0 in | Wt 132.9 lb

## 2018-01-03 DIAGNOSIS — Z23 Encounter for immunization: Secondary | ICD-10-CM | POA: Diagnosis not present

## 2018-01-03 DIAGNOSIS — E785 Hyperlipidemia, unspecified: Secondary | ICD-10-CM | POA: Diagnosis not present

## 2018-01-03 DIAGNOSIS — E1159 Type 2 diabetes mellitus with other circulatory complications: Secondary | ICD-10-CM

## 2018-01-03 DIAGNOSIS — E039 Hypothyroidism, unspecified: Secondary | ICD-10-CM

## 2018-01-03 DIAGNOSIS — Z794 Long term (current) use of insulin: Secondary | ICD-10-CM | POA: Diagnosis not present

## 2018-01-03 DIAGNOSIS — F4329 Adjustment disorder with other symptoms: Secondary | ICD-10-CM

## 2018-01-03 DIAGNOSIS — N183 Chronic kidney disease, stage 3 unspecified: Secondary | ICD-10-CM

## 2018-01-03 DIAGNOSIS — I10 Essential (primary) hypertension: Secondary | ICD-10-CM

## 2018-01-03 DIAGNOSIS — Z9181 History of falling: Secondary | ICD-10-CM

## 2018-01-03 DIAGNOSIS — F4321 Adjustment disorder with depressed mood: Secondary | ICD-10-CM

## 2018-01-03 DIAGNOSIS — F4381 Prolonged grief disorder: Secondary | ICD-10-CM

## 2018-01-03 NOTE — Patient Instructions (Signed)
I would like for you you to schedule a Medicare Annual Wellness Visit (AWV).   This is a yearly appointment with our Health Coach (Susan Hauck, RN) and is designed to develop a personalized prevention plan. This is not a head to toe physical, but rather an opportunity to prevent illness based on your current health and risk factors for disease.   Visits usually last 30-60 minutes and include various screenings for hearing, vision, depression, and dementia, falls, and safety concerns. The visit also includes diet and exercise counseling and information about advance directives.   This is also an opportunity to discuss appropriate health maintenance testing such as mammography, colonoscopy, lung cancer screening, and hepatitis C testing.   The AWV is fully covered by Medicare Part B if:  . You have had Part B for over 12 months, AND . You have not had an AWV in the past 12 months .  Please don't miss out on this opportunity! Set up your appointment today!  

## 2018-01-03 NOTE — Progress Notes (Signed)
Subjective:     Patient ID: Casey Harrell, female   DOB: 04-15-1935, 83 y.o.   MRN: [id number]  HPI Patient has multiple chronic problems including history of type 2 diabetes, peripheral neuropathy, cerebrovascular disease, atrial fibrillation, hypothyroidism, peripheral vascular disease, hypertension, dyslipidemia, GERD chronic kidney disease. She is accompanied by her daughter with several issues. Daughter is very concerned because she's not been taking her medications regularly. They've place them in pillbox with clear directions and she still not taking them at times.  Her husband died several months ago. She's had tremendous difficulty coping. She does remain on Cymbalta twice daily. Family tries to keep her engaged. Daughter is looking at trying to get a daytime sitter for her.  She is on multiple medications and these were reviewed. It is not clear exactly what her compliance level is been with any of her medications. Daughter states at times she does not even take her insulin. They're not monitoring blood sugars regularly. Deny recent hypoglycemia symptoms. No recent falls. Appetite is been poor at times.her weight is stable compared to last visit in June.  She denies any chest pains. She's had some chronic sleep difficulties takes low-dose trazodone at night.  Past Medical History:  Diagnosis Date  . ASTHMA 07/15/2008  . COPD (chronic obstructive pulmonary disease) (HCC)   . DIABETES-TYPE 2 07/15/2008  . DIABETIC PERIPHERAL NEUROPATHY 09/02/2009  . GERD 07/15/2008  . HYPERTENSION 07/15/2008  . Normal cardiac stress test    low risk Nuc 2009, Feb 2011  . Paroxysmal atrial fibrillation (HCC)   . PVD (peripheral vascular disease) (HCC) 06/18/09   RCE  . Stroke (HCC)   . Vertigo    Past Surgical History:  Procedure Laterality Date  . ABDOMINAL HYSTERECTOMY  1971  . APPENDECTOMY  1971  . CAROTID ENDARTERECTOMY  06/18/09   RCE  . CESAREAN SECTION    . colectomy and colostomy    .  FLEXIBLE SIGMOIDOSCOPY     diverticulitis  . FOOT SURGERY     right foot X 2  . LAPAROTOMY     X 3 with adhesions lysis  . TONSILLECTOMY    . TUBAL LIGATION      reports that she has never smoked. She has never used smokeless tobacco. She reports that she does not drink alcohol or use drugs. family history includes Arthritis in her mother; Asthma in her sister; Cancer in her paternal grandfather; Cancer (age of onset: 2560) in her brother; Celiac disease in her sister; Diabetes in her mother; Heart disease in her brother, father, mother, sister, and sister; Stroke in her maternal grandfather. Allergies  Allergen Reactions  . Doxycycline Hyclate Nausea And Vomiting  . Morphine Sulfate Other (See Comments)    REACTION: hallucinations  . Iohexol Hives     Code: HIVES, Desc: pt. states she breaks out in hives 06/15/08   . Moxifloxacin Other (See Comments)    REACTION: unknown     Review of Systems  Constitutional: Positive for fatigue. Negative for chills and fever.  HENT: Negative for trouble swallowing.   Respiratory: Negative for cough and shortness of breath.   Cardiovascular: Negative for chest pain.  Gastrointestinal: Negative for abdominal pain, diarrhea, nausea and vomiting.  Genitourinary: Negative for dysuria.  Neurological: Negative for seizures, syncope and speech difficulty.  Psychiatric/Behavioral: Positive for dysphoric mood.       Objective:   Physical Exam  Constitutional: She is oriented to person, place, and time. She appears well-developed and well-nourished.  HENT:  Mouth/Throat: Oropharynx is clear and moist.  Cardiovascular: Normal rate.  Pulmonary/Chest: Effort normal and breath sounds normal. She has no wheezes. She has no rales.  Musculoskeletal: She exhibits no edema.  Neurological: She is alert and oriented to person, place, and time.  Psychiatric:  Patient became slightly teary when discussing her husband's passing       Assessment:     #1  type 2 diabetes. History of poor compliance with diet and medications  #2 hypertension. Repeat reading right arm seated at rest 122/68  #3 poor compliance with medications  #4 grief reaction related to husband's death during the past year  #5 history of peripheral neuropathy currently treated with Lyrica and Cymbalta  #6 chronic kidney disease stage III  #7 hypothyroidism    Plan:     -agree with family's idea to look at possible sitter. -Recommend Medicare wellness visit. We also suggested that they consider day programs for seniors that could get her engaged and they will look into that -Flu vaccine given -check further labs with TSH, lipid panel, hepatic panel, hemoglobin A1c, CBC, basic metabolic panel -we'll plan routine follow-up in 3 months and sooner as needed -over 40 minutes spent of which > 50 % in direct assessment and counseling regarding goals of care, additional daytime care options, review of medications,preventive issues.  [name] MD  Primary Care at [facility]

## 2018-01-04 LAB — LIPID PANEL
Cholesterol: 118 mg/dL (ref 0–200)
HDL: 37.4 mg/dL — ABNORMAL LOW (ref >39.00)
LDL Cholesterol: 56 mg/dL (ref 0–99)
NonHDL: 80.26
Total CHOL/HDL Ratio: 3
Triglycerides: 120 mg/dL (ref 0.0–149.0)
VLDL: 24 mg/dL (ref 0.0–40.0)

## 2018-01-04 LAB — HEPATIC FUNCTION PANEL
ALT: 10 U/L (ref 0–35)
AST: 11 U/L (ref 0–37)
Albumin: 4.5 g/dL (ref 3.5–5.2)
Alkaline Phosphatase: 94 U/L (ref 39–117)
Bilirubin, Direct: 0.1 mg/dL (ref 0.0–0.3)
Total Bilirubin: 0.6 mg/dL (ref 0.2–1.2)
Total Protein: 7.7 g/dL (ref 6.0–8.3)

## 2018-01-04 LAB — BASIC METABOLIC PANEL
BUN: 18 mg/dL (ref 6–23)
CO2: 29 mEq/L (ref 19–32)
Calcium: 10.4 mg/dL (ref 8.4–10.5)
Chloride: 100 mEq/L (ref 96–112)
Creatinine, Ser: 0.94 mg/dL (ref 0.40–1.20)
GFR: 60.79 mL/min (ref >60.00)
Glucose, Bld: 178 mg/dL — ABNORMAL HIGH (ref 70–99)
Potassium: 3.8 mEq/L (ref 3.5–5.1)
Sodium: 139 mEq/L (ref 135–145)

## 2018-01-04 LAB — CBC WITH DIFFERENTIAL/PLATELET
Basophils Absolute: 0.1 10*3/uL (ref 0.0–0.1)
Basophils Relative: 1 % (ref 0.0–3.0)
Eosinophils Absolute: 0.4 10*3/uL (ref 0.0–0.7)
Eosinophils Relative: 3.3 % (ref 0.0–5.0)
HCT: 41.7 % (ref 36.0–46.0)
Hemoglobin: 13.7 g/dL (ref 12.0–15.0)
Lymphocytes Relative: 30.6 % (ref 12.0–46.0)
Lymphs Abs: 3.3 10*3/uL (ref 0.7–4.0)
MCHC: 32.9 g/dL (ref 30.0–36.0)
MCV: 85.2 fl (ref 78.0–100.0)
Monocytes Absolute: 0.7 10*3/uL (ref 0.1–1.0)
Monocytes Relative: 6.9 % (ref 3.0–12.0)
Neutro Abs: 6.3 10*3/uL (ref 1.4–7.7)
Neutrophils Relative %: 58.2 % (ref 43.0–77.0)
Platelets: 286 10*3/uL (ref 150.0–400.0)
RBC: 4.9 Mil/uL (ref 3.87–5.11)
RDW: 17.8 % — ABNORMAL HIGH (ref 11.5–15.5)
WBC: 10.8 10*3/uL — ABNORMAL HIGH (ref 4.0–10.5)

## 2018-01-04 LAB — TSH: TSH: 1.78 u[IU]/mL (ref 0.35–4.50)

## 2018-01-04 LAB — HEMOGLOBIN A1C: Hgb A1c MFr Bld: 11.1 % — ABNORMAL HIGH (ref 4.6–6.5)

## 2018-01-05 DIAGNOSIS — F4381 Prolonged grief disorder: Secondary | ICD-10-CM | POA: Insufficient documentation

## 2018-01-05 DIAGNOSIS — F4321 Adjustment disorder with depressed mood: Secondary | ICD-10-CM | POA: Insufficient documentation

## 2018-01-05 DIAGNOSIS — F4329 Adjustment disorder with other symptoms: Secondary | ICD-10-CM | POA: Insufficient documentation

## 2018-01-12 ENCOUNTER — Other Ambulatory Visit: Payer: Self-pay | Admitting: Family Medicine

## 2018-01-24 ENCOUNTER — Other Ambulatory Visit: Payer: Self-pay | Admitting: Family Medicine

## 2018-01-24 NOTE — Telephone Encounter (Signed)
Refill for one year 

## 2018-01-24 NOTE — Telephone Encounter (Signed)
Last refill given on 4/23 for #30 with 3 ref

## 2018-01-25 NOTE — Telephone Encounter (Signed)
Rx done. 

## 2018-02-03 ENCOUNTER — Other Ambulatory Visit: Payer: Self-pay | Admitting: Family Medicine

## 2018-02-09 ENCOUNTER — Other Ambulatory Visit: Payer: Self-pay | Admitting: Family Medicine

## 2018-02-11 ENCOUNTER — Other Ambulatory Visit: Payer: Self-pay | Admitting: Family Medicine

## 2018-02-11 NOTE — Telephone Encounter (Signed)
Last OV 01/03/18, No future OV  Last filled by Mariam Dollar PA-C  Are you filling this now? Please advise.

## 2018-02-11 NOTE — Telephone Encounter (Signed)
Refills OK for one year. 

## 2018-02-11 NOTE — Telephone Encounter (Signed)
Last OV 01/03/18, No future OV  Last filled 10/15/17, # 20 with 1 refill

## 2018-02-12 NOTE — Telephone Encounter (Signed)
Refill once only .  Recommend against regular use- to reduce chance of falls.

## 2018-02-17 ENCOUNTER — Other Ambulatory Visit: Payer: Self-pay | Admitting: Family Medicine

## 2018-02-19 ENCOUNTER — Other Ambulatory Visit: Payer: Self-pay | Admitting: Family Medicine

## 2018-02-24 ENCOUNTER — Other Ambulatory Visit: Payer: Self-pay | Admitting: Family Medicine

## 2018-02-25 NOTE — Telephone Encounter (Signed)
Last OV 01/03/18, No future OV  Last filled 04/13/17, # 90 with 1 refill by Charlton Amor, PA-C

## 2018-02-25 NOTE — Telephone Encounter (Signed)
Refill for 6 months. 

## 2018-03-10 ENCOUNTER — Other Ambulatory Visit: Payer: Self-pay | Admitting: Family Medicine

## 2018-03-19 ENCOUNTER — Other Ambulatory Visit: Payer: Self-pay

## 2018-03-19 MED ORDER — AMLODIPINE BESYLATE 5 MG PO TABS: 5.0000 mg | ORAL_TABLET | Freq: Every day | ORAL | 5 refills | Status: DC

## 2018-03-22 ENCOUNTER — Other Ambulatory Visit: Payer: Self-pay | Admitting: Family Medicine

## 2018-03-23 ENCOUNTER — Other Ambulatory Visit: Payer: Self-pay | Admitting: Family Medicine

## 2018-04-12 ENCOUNTER — Other Ambulatory Visit: Payer: Self-pay | Admitting: Family Medicine

## 2018-04-23 ENCOUNTER — Other Ambulatory Visit: Payer: Self-pay | Admitting: Family Medicine

## 2018-04-23 NOTE — Telephone Encounter (Signed)
Refill for one year.  For ones like this- where they have been filled per hospitalist- go ahead and refill per medication refill protocol.

## 2018-04-23 NOTE — Telephone Encounter (Signed)
Last OV 01/03/18, No future OV  Last filled by Dr. Mcarthur Rossettianiel J. Angiulli on 04/13/17, # 90 with 1 refill  Are you filling this now?

## 2018-05-18 ENCOUNTER — Other Ambulatory Visit: Payer: Self-pay | Admitting: Family Medicine

## 2018-05-19 ENCOUNTER — Other Ambulatory Visit: Payer: Self-pay | Admitting: Family Medicine

## 2018-05-20 NOTE — Telephone Encounter (Signed)
Last OV 01/03/18, No future OV  Last filled by Mariam Dollaraniel Angiulli PA-C with Dr. Riley KillSwartz on 04/14/17, # 60 with 11 refills  Are you prescribing now? Please advise if OK to fill.

## 2018-05-20 NOTE — Telephone Encounter (Signed)
Refill once, but she needs office follow up.

## 2018-06-07 ENCOUNTER — Other Ambulatory Visit: Payer: Self-pay | Admitting: Family Medicine

## 2018-06-10 ENCOUNTER — Other Ambulatory Visit: Payer: Self-pay | Admitting: Family Medicine

## 2018-06-16 ENCOUNTER — Other Ambulatory Visit: Payer: Self-pay | Admitting: Family Medicine

## 2018-06-17 ENCOUNTER — Other Ambulatory Visit: Payer: Self-pay | Admitting: Family Medicine

## 2018-06-24 ENCOUNTER — Other Ambulatory Visit: Payer: Self-pay

## 2018-06-24 ENCOUNTER — Encounter: Payer: Self-pay | Admitting: Family Medicine

## 2018-06-24 ENCOUNTER — Ambulatory Visit (INDEPENDENT_AMBULATORY_CARE_PROVIDER_SITE_OTHER): Payer: Medicare Other | Admitting: Family Medicine

## 2018-06-24 VITALS — BP 110/70 | HR 65 | Temp 98.2°F | Ht 61.0 in | Wt 132.4 lb

## 2018-06-24 DIAGNOSIS — R062 Wheezing: Secondary | ICD-10-CM

## 2018-06-24 DIAGNOSIS — E1165 Type 2 diabetes mellitus with hyperglycemia: Secondary | ICD-10-CM

## 2018-06-24 MED ORDER — METHYLPREDNISOLONE ACETATE 80 MG/ML IJ SUSP
80.0000 mg | Freq: Once | INTRAMUSCULAR | Status: AC
  Administered 2018-06-24: 80 mg via INTRAMUSCULAR

## 2018-06-24 MED ORDER — CEFUROXIME AXETIL 250 MG PO TABS: 250.0000 mg | ORAL_TABLET | Freq: Two times a day (BID) | ORAL | 0 refills | Status: DC

## 2018-06-24 MED ORDER — IPRATROPIUM-ALBUTEROL 0.5-2.5 (3) MG/3ML IN SOLN
3.0000 mL | Freq: Once | RESPIRATORY_TRACT | Status: AC
  Administered 2018-06-24: 3 mL via RESPIRATORY_TRACT

## 2018-06-24 MED ORDER — ALBUTEROL SULFATE 108 (90 BASE) MCG/ACT IN AEPB: 2.0000 | INHALATION_SPRAY | RESPIRATORY_TRACT | 1 refills | Status: DC | PRN

## 2018-06-24 NOTE — Progress Notes (Signed)
Subjective:     Patient ID: Casey Harrell, female   DOB: 1934/05/13, 84 y.o.   MRN: [id number]  HPI  Patient has multiple chronic medical problems and has not been seen here several months.  Currently living with her son.   Progressive cough and wheezing over the past several days.  Son came down last week with sore throat, nasal congestion, and cough and body aches.  Patient developed symptoms several days ago.  No documented fever.  Cough productive of yellow-green sputum.  She has had some audible wheezing.  Her past medical history includes hypertension, peripheral vascular disease, history of multiple strokes, hypertension, poorly controlled type 2 diabetes, history of small bowel obstruction, asthma, hypothyroidism, chronic kidney disease, history of depression.  Her husband passed away from cancer complications about a year ago and she has been having difficulties since then.  She recently moved into live with her son.  She has not been monitoring blood sugars regularly.  She states that she is taking her insulin regularly.  Her last A1c was very high at 11.1 back in August.  She has not been in for follow-up since then  Past Medical History:  Diagnosis Date  . ASTHMA 07/15/2008  . COPD (chronic obstructive pulmonary disease) (HCC)   . DIABETES-TYPE 2 07/15/2008  . DIABETIC PERIPHERAL NEUROPATHY 09/02/2009  . GERD 07/15/2008  . HYPERTENSION 07/15/2008  . Normal cardiac stress test    low risk Nuc 2009, Feb 2011  . Paroxysmal atrial fibrillation (HCC)   . PVD (peripheral vascular disease) (HCC) 06/18/09   RCE  . Stroke (HCC)   . Vertigo    Past Surgical History:  Procedure Laterality Date  . ABDOMINAL HYSTERECTOMY  1971  . APPENDECTOMY  1971  . CAROTID ENDARTERECTOMY  06/18/09   RCE  . CESAREAN SECTION    . colectomy and colostomy    . FLEXIBLE SIGMOIDOSCOPY     diverticulitis  . FOOT SURGERY     right foot X 2  . LAPAROTOMY     X 3 with adhesions lysis  . TONSILLECTOMY    .  TUBAL LIGATION      reports that she has never smoked. She has never used smokeless tobacco. She reports that she does not drink alcohol or use drugs. family history includes Arthritis in her mother; Asthma in her sister; Cancer in her paternal grandfather; Cancer (age of onset: 80) in her brother; Celiac disease in her sister; Diabetes in her mother; Heart disease in her brother, father, mother, sister, and sister; Stroke in her maternal grandfather. Allergies  Allergen Reactions  . Doxycycline Hyclate Nausea And Vomiting  . Morphine Sulfate Other (See Comments)    REACTION: hallucinations  . Iohexol Hives     Code: HIVES, Desc: pt. states she breaks out in hives 06/15/08   . Moxifloxacin Other (See Comments)    REACTION: unknown    Review of Systems  Constitutional: Positive for fatigue. Negative for chills and fever.  HENT: Positive for congestion. Negative for sore throat.   Respiratory: Positive for cough and wheezing.   Cardiovascular: Negative for chest pain, palpitations and leg swelling.  Gastrointestinal: Negative for abdominal pain.  Genitourinary: Negative for dysuria.  Psychiatric/Behavioral: Negative for confusion.       Objective:   Physical Exam Constitutional:      Appearance: Normal appearance.  HENT:     Right Ear: Tympanic membrane normal.     Left Ear: Tympanic membrane normal.     Mouth/Throat:  Pharynx: Oropharynx is clear. No oropharyngeal exudate.  Neck:     Musculoskeletal: Neck supple.  Cardiovascular:     Rate and Rhythm: Regular rhythm.  Pulmonary:     Comments: She has some diffuse expiratory wheezes.  No rales.  Pulse oximetry 95%.  No retractions Musculoskeletal:     Right lower leg: No edema.     Left lower leg: No edema.  Neurological:     Mental Status: She is alert.        Assessment:     #1 cough and reactive airway changes on exam.  Patient is an ex-smoker who has history of asthma and multiple other comorbidities as above.   Likely viral trigger.  High risk for complications.  #2 type 2 diabetes poorly controlled    Plan:     -Depo-Medrol 80 mg IM given -Nebulizer with DuoNeb.  Pt noted some mild improvement afterwards. -Patient is not sure if she still has home nebulizer.  We wrote for pro-air respi-click-2 puffs every 4-6 hours as needed -We elected to cover with Ceftin 250 mg twice daily for 7 days -Follow-up immediately for any fever or increasing respiratory distress -We have asked son to check and see if they have home nebulizer and if this can be located we will renew her DuoNeb to get started back on regularly -Schedule follow-up within the next month to reassess her multiple medical problems  [name] MD [facility]

## 2018-06-24 NOTE — Patient Instructions (Signed)
Plenty of fluids and rest  Start the antibiotic tonight  Follow up for any fever or increased shortness of breath  Check on whether nebulizer still at home.

## 2018-07-12 ENCOUNTER — Other Ambulatory Visit: Payer: Self-pay | Admitting: Family Medicine

## 2018-07-22 ENCOUNTER — Other Ambulatory Visit: Payer: Self-pay | Admitting: Family Medicine

## 2018-07-31 ENCOUNTER — Other Ambulatory Visit: Payer: Self-pay | Admitting: Family Medicine

## 2018-08-04 ENCOUNTER — Other Ambulatory Visit: Payer: Self-pay | Admitting: Family Medicine

## 2018-08-06 ENCOUNTER — Other Ambulatory Visit: Payer: Self-pay | Admitting: Family Medicine

## 2018-08-06 ENCOUNTER — Telehealth: Payer: Self-pay

## 2018-08-06 NOTE — Telephone Encounter (Signed)
Set up Webex- if we can.

## 2018-08-06 NOTE — Telephone Encounter (Signed)
Patient has an appointment at 3:15pm tomorrow, 08/07/18, for follow up and medication refill. 
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
## 2018-08-06 NOTE — Telephone Encounter (Signed)
Patient has an appointment at 3:15pm tomorrow, 08/07/18, for follow up and medication refill.

## 2018-08-07 ENCOUNTER — Ambulatory Visit (INDEPENDENT_AMBULATORY_CARE_PROVIDER_SITE_OTHER): Payer: Medicare Other | Admitting: Family Medicine

## 2018-08-07 ENCOUNTER — Other Ambulatory Visit: Payer: Self-pay

## 2018-08-07 DIAGNOSIS — R197 Diarrhea, unspecified: Secondary | ICD-10-CM

## 2018-08-07 DIAGNOSIS — E1140 Type 2 diabetes mellitus with diabetic neuropathy, unspecified: Secondary | ICD-10-CM

## 2018-08-07 DIAGNOSIS — IMO0002 Reserved for concepts with insufficient information to code with codable children: Secondary | ICD-10-CM

## 2018-08-07 DIAGNOSIS — R2689 Other abnormalities of gait and mobility: Secondary | ICD-10-CM

## 2018-08-07 DIAGNOSIS — E1165 Type 2 diabetes mellitus with hyperglycemia: Secondary | ICD-10-CM | POA: Diagnosis not present

## 2018-08-07 DIAGNOSIS — I10 Essential (primary) hypertension: Secondary | ICD-10-CM | POA: Diagnosis not present

## 2018-08-07 NOTE — Telephone Encounter (Signed)
Patients daughter can only do a telephone visit and is scheduled today at 3:15pm today.

## 2018-08-07 NOTE — Telephone Encounter (Signed)
Sounds good
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
## 2018-08-20 ENCOUNTER — Other Ambulatory Visit: Payer: Self-pay | Admitting: Family Medicine

## 2018-09-02 ENCOUNTER — Other Ambulatory Visit: Payer: Self-pay | Admitting: Family Medicine

## 2018-09-09 ENCOUNTER — Other Ambulatory Visit: Payer: Self-pay | Admitting: Family Medicine

## 2018-09-22 ENCOUNTER — Other Ambulatory Visit: Payer: Self-pay | Admitting: Family Medicine

## 2018-09-24 ENCOUNTER — Other Ambulatory Visit: Payer: Self-pay | Admitting: Family Medicine

## 2018-09-28 ENCOUNTER — Other Ambulatory Visit: Payer: Self-pay | Admitting: Family Medicine

## 2018-10-03 ENCOUNTER — Inpatient Hospital Stay (HOSPITAL_COMMUNITY)
Admission: EM | Admit: 2018-10-03 | Discharge: 2018-10-08 | DRG: 065 | Disposition: A | Discharge status: Rehabilitation Facility | Payer: Medicare Other | Attending: Internal Medicine | Admitting: Internal Medicine

## 2018-10-03 ENCOUNTER — Other Ambulatory Visit: Payer: Self-pay

## 2018-10-03 ENCOUNTER — Emergency Department (HOSPITAL_COMMUNITY): Payer: Medicare Other

## 2018-10-03 ENCOUNTER — Encounter (HOSPITAL_COMMUNITY): Payer: Self-pay

## 2018-10-03 DIAGNOSIS — Z8249 Family history of ischemic heart disease and other diseases of the circulatory system: Secondary | ICD-10-CM

## 2018-10-03 DIAGNOSIS — G4730 Sleep apnea, unspecified: Secondary | ICD-10-CM | POA: Diagnosis present

## 2018-10-03 DIAGNOSIS — I639 Cerebral infarction, unspecified: Secondary | ICD-10-CM | POA: Diagnosis not present

## 2018-10-03 DIAGNOSIS — F329 Major depressive disorder, single episode, unspecified: Secondary | ICD-10-CM | POA: Diagnosis present

## 2018-10-03 DIAGNOSIS — F419 Anxiety disorder, unspecified: Secondary | ICD-10-CM | POA: Diagnosis present

## 2018-10-03 DIAGNOSIS — Z1159 Encounter for screening for other viral diseases: Secondary | ICD-10-CM | POA: Diagnosis not present

## 2018-10-03 DIAGNOSIS — Z03818 Encounter for observation for suspected exposure to other biological agents ruled out: Secondary | ICD-10-CM | POA: Diagnosis not present

## 2018-10-03 DIAGNOSIS — R001 Bradycardia, unspecified: Secondary | ICD-10-CM | POA: Diagnosis not present

## 2018-10-03 DIAGNOSIS — G8191 Hemiplegia, unspecified affecting right dominant side: Secondary | ICD-10-CM | POA: Diagnosis not present

## 2018-10-03 DIAGNOSIS — E1151 Type 2 diabetes mellitus with diabetic peripheral angiopathy without gangrene: Secondary | ICD-10-CM | POA: Diagnosis not present

## 2018-10-03 DIAGNOSIS — N179 Acute kidney failure, unspecified: Secondary | ICD-10-CM | POA: Diagnosis not present

## 2018-10-03 DIAGNOSIS — H5509 Other forms of nystagmus: Secondary | ICD-10-CM | POA: Diagnosis not present

## 2018-10-03 DIAGNOSIS — E785 Hyperlipidemia, unspecified: Secondary | ICD-10-CM | POA: Diagnosis present

## 2018-10-03 DIAGNOSIS — E1122 Type 2 diabetes mellitus with diabetic chronic kidney disease: Secondary | ICD-10-CM | POA: Diagnosis present

## 2018-10-03 DIAGNOSIS — D72829 Elevated white blood cell count, unspecified: Secondary | ICD-10-CM | POA: Diagnosis not present

## 2018-10-03 DIAGNOSIS — I959 Hypotension, unspecified: Secondary | ICD-10-CM | POA: Diagnosis not present

## 2018-10-03 DIAGNOSIS — R590 Localized enlarged lymph nodes: Secondary | ICD-10-CM | POA: Diagnosis present

## 2018-10-03 DIAGNOSIS — N183 Chronic kidney disease, stage 3 (moderate): Secondary | ICD-10-CM | POA: Diagnosis not present

## 2018-10-03 DIAGNOSIS — R29708 NIHSS score 8: Secondary | ICD-10-CM | POA: Diagnosis present

## 2018-10-03 DIAGNOSIS — I129 Hypertensive chronic kidney disease with stage 1 through stage 4 chronic kidney disease, or unspecified chronic kidney disease: Secondary | ICD-10-CM | POA: Diagnosis not present

## 2018-10-03 DIAGNOSIS — E1142 Type 2 diabetes mellitus with diabetic polyneuropathy: Secondary | ICD-10-CM | POA: Diagnosis not present

## 2018-10-03 DIAGNOSIS — Z8261 Family history of arthritis: Secondary | ICD-10-CM

## 2018-10-03 DIAGNOSIS — Z66 Do not resuscitate: Secondary | ICD-10-CM | POA: Diagnosis not present

## 2018-10-03 DIAGNOSIS — R7309 Other abnormal glucose: Secondary | ICD-10-CM | POA: Diagnosis not present

## 2018-10-03 DIAGNOSIS — Z825 Family history of asthma and other chronic lower respiratory diseases: Secondary | ICD-10-CM

## 2018-10-03 DIAGNOSIS — I6302 Cerebral infarction due to thrombosis of basilar artery: Secondary | ICD-10-CM

## 2018-10-03 DIAGNOSIS — J449 Chronic obstructive pulmonary disease, unspecified: Secondary | ICD-10-CM | POA: Diagnosis not present

## 2018-10-03 DIAGNOSIS — Z7901 Long term (current) use of anticoagulants: Secondary | ICD-10-CM

## 2018-10-03 DIAGNOSIS — Z794 Long term (current) use of insulin: Secondary | ICD-10-CM

## 2018-10-03 DIAGNOSIS — I6381 Other cerebral infarction due to occlusion or stenosis of small artery: Principal | ICD-10-CM | POA: Diagnosis present

## 2018-10-03 DIAGNOSIS — IMO0002 Reserved for concepts with insufficient information to code with codable children: Secondary | ICD-10-CM | POA: Diagnosis present

## 2018-10-03 DIAGNOSIS — Z823 Family history of stroke: Secondary | ICD-10-CM

## 2018-10-03 DIAGNOSIS — I6623 Occlusion and stenosis of bilateral posterior cerebral arteries: Secondary | ICD-10-CM | POA: Diagnosis not present

## 2018-10-03 DIAGNOSIS — R4781 Slurred speech: Secondary | ICD-10-CM | POA: Diagnosis present

## 2018-10-03 DIAGNOSIS — E1165 Type 2 diabetes mellitus with hyperglycemia: Secondary | ICD-10-CM | POA: Diagnosis not present

## 2018-10-03 DIAGNOSIS — Z833 Family history of diabetes mellitus: Secondary | ICD-10-CM

## 2018-10-03 DIAGNOSIS — I480 Paroxysmal atrial fibrillation: Secondary | ICD-10-CM | POA: Diagnosis not present

## 2018-10-03 DIAGNOSIS — R29810 Facial weakness: Secondary | ICD-10-CM | POA: Diagnosis not present

## 2018-10-03 DIAGNOSIS — I6350 Cerebral infarction due to unspecified occlusion or stenosis of unspecified cerebral artery: Secondary | ICD-10-CM | POA: Diagnosis not present

## 2018-10-03 DIAGNOSIS — G4733 Obstructive sleep apnea (adult) (pediatric): Secondary | ICD-10-CM | POA: Diagnosis not present

## 2018-10-03 DIAGNOSIS — I69351 Hemiplegia and hemiparesis following cerebral infarction affecting right dominant side: Secondary | ICD-10-CM | POA: Diagnosis not present

## 2018-10-03 DIAGNOSIS — E039 Hypothyroidism, unspecified: Secondary | ICD-10-CM | POA: Diagnosis present

## 2018-10-03 DIAGNOSIS — K219 Gastro-esophageal reflux disease without esophagitis: Secondary | ICD-10-CM | POA: Diagnosis not present

## 2018-10-03 DIAGNOSIS — Z801 Family history of malignant neoplasm of trachea, bronchus and lung: Secondary | ICD-10-CM

## 2018-10-03 DIAGNOSIS — Z8673 Personal history of transient ischemic attack (TIA), and cerebral infarction without residual deficits: Secondary | ICD-10-CM

## 2018-10-03 DIAGNOSIS — I10 Essential (primary) hypertension: Secondary | ICD-10-CM | POA: Diagnosis present

## 2018-10-03 DIAGNOSIS — I351 Nonrheumatic aortic (valve) insufficiency: Secondary | ICD-10-CM | POA: Diagnosis not present

## 2018-10-03 DIAGNOSIS — R0989 Other specified symptoms and signs involving the circulatory and respiratory systems: Secondary | ICD-10-CM | POA: Diagnosis not present

## 2018-10-03 DIAGNOSIS — E119 Type 2 diabetes mellitus without complications: Secondary | ICD-10-CM | POA: Diagnosis not present

## 2018-10-03 LAB — DIFFERENTIAL
Abs Immature Granulocytes: 0.04 10*3/uL (ref 0.00–0.07)
Basophils Absolute: 0 10*3/uL (ref 0.0–0.1)
Basophils Relative: 0 %
Eosinophils Absolute: 0.5 10*3/uL (ref 0.0–0.5)
Eosinophils Relative: 5 %
Immature Granulocytes: 0 %
Lymphocytes Relative: 38 %
Lymphs Abs: 4.1 10*3/uL — ABNORMAL HIGH (ref 0.7–4.0)
Monocytes Absolute: 1.1 10*3/uL — ABNORMAL HIGH (ref 0.1–1.0)
Monocytes Relative: 10 %
Neutro Abs: 4.9 10*3/uL (ref 1.7–7.7)
Neutrophils Relative %: 47 %

## 2018-10-03 LAB — COMPREHENSIVE METABOLIC PANEL
ALT: 12 U/L (ref 0–44)
AST: 15 U/L (ref 15–41)
Albumin: 3.7 g/dL (ref 3.5–5.0)
Alkaline Phosphatase: 96 U/L (ref 38–126)
Anion gap: 10 (ref 5–15)
BUN: 20 mg/dL (ref 8–23)
CO2: 22 mmol/L (ref 22–32)
Calcium: 9.6 mg/dL (ref 8.9–10.3)
Chloride: 104 mmol/L (ref 98–111)
Creatinine, Ser: 1.05 mg/dL — ABNORMAL HIGH (ref 0.44–1.00)
GFR calc Af Amer: 58 mL/min — ABNORMAL LOW (ref >60)
GFR calc non Af Amer: 50 mL/min — ABNORMAL LOW (ref >60)
Glucose, Bld: 133 mg/dL — ABNORMAL HIGH (ref 70–99)
Potassium: 3.5 mmol/L (ref 3.5–5.1)
Sodium: 136 mmol/L (ref 135–145)
Total Bilirubin: 0.5 mg/dL (ref 0.3–1.2)
Total Protein: 7 g/dL (ref 6.5–8.1)

## 2018-10-03 LAB — SARS CORONAVIRUS 2 BY RT PCR (HOSPITAL ORDER, PERFORMED IN ~~LOC~~ HOSPITAL LAB): SARS Coronavirus 2: NEGATIVE

## 2018-10-03 LAB — CBC
HCT: 39.9 % (ref 36.0–46.0)
Hemoglobin: 13.1 g/dL (ref 12.0–15.0)
MCH: 29.8 pg (ref 26.0–34.0)
MCHC: 32.8 g/dL (ref 30.0–36.0)
MCV: 90.7 fL (ref 80.0–100.0)
Platelets: 267 10*3/uL (ref 150–400)
RBC: 4.4 MIL/uL (ref 3.87–5.11)
RDW: 13.4 % (ref 11.5–15.5)
WBC: 10.6 10*3/uL — ABNORMAL HIGH (ref 4.0–10.5)
nRBC: 0 % (ref 0.0–0.2)

## 2018-10-03 LAB — APTT: aPTT: 33 seconds (ref 24–36)

## 2018-10-03 LAB — I-STAT CHEM 8, ED
BUN: 22 mg/dL (ref 8–23)
Calcium, Ion: 1.15 mmol/L (ref 1.15–1.40)
Chloride: 103 mmol/L (ref 98–111)
Creatinine, Ser: 1 mg/dL (ref 0.44–1.00)
Glucose, Bld: 131 mg/dL — ABNORMAL HIGH (ref 70–99)
HCT: 41 % (ref 36.0–46.0)
Hemoglobin: 13.9 g/dL (ref 12.0–15.0)
Potassium: 3.5 mmol/L (ref 3.5–5.1)
Sodium: 137 mmol/L (ref 135–145)
TCO2: 23 mmol/L (ref 22–32)

## 2018-10-03 LAB — GLUCOSE, CAPILLARY: Glucose-Capillary: 68 mg/dL — ABNORMAL LOW (ref 70–99)

## 2018-10-03 LAB — PROTIME-INR
INR: 1.4 — ABNORMAL HIGH (ref 0.8–1.2)
Prothrombin Time: 16.9 seconds — ABNORMAL HIGH (ref 11.4–15.2)

## 2018-10-03 LAB — ETHANOL: Alcohol, Ethyl (B): <10 mg/dL (ref <10)

## 2018-10-03 MED ORDER — IPRATROPIUM-ALBUTEROL 0.5-2.5 (3) MG/3ML IN SOLN: 3.0000 mL | Freq: Four times a day (QID) | RESPIRATORY_TRACT | Status: DC | PRN

## 2018-10-03 MED ORDER — ASPIRIN EC 81 MG PO TBEC
81.0000 mg | DELAYED_RELEASE_TABLET | Freq: Every day | ORAL | Status: DC
  Administered 2018-10-04: 09:00:00 81 mg via ORAL
  Filled 2018-10-03: qty 1

## 2018-10-03 MED ORDER — ASPIRIN 325 MG PO TABS: 325.0000 mg | ORAL_TABLET | Freq: Every day | ORAL | Status: DC

## 2018-10-03 MED ORDER — ACETAMINOPHEN 160 MG/5ML PO SOLN: 650.0000 mg | ORAL | Status: DC | PRN

## 2018-10-03 MED ORDER — DULOXETINE HCL 30 MG PO CPEP
30.0000 mg | ORAL_CAPSULE | Freq: Two times a day (BID) | ORAL | Status: DC
  Administered 2018-10-04 – 2018-10-08 (×9): 30 mg via ORAL
  Filled 2018-10-03 (×9): qty 1

## 2018-10-03 MED ORDER — ASPIRIN 300 MG RE SUPP: 300.0000 mg | Freq: Every day | RECTAL | Status: DC

## 2018-10-03 MED ORDER — LEVOTHYROXINE SODIUM 50 MCG PO TABS
50.0000 ug | ORAL_TABLET | Freq: Every day | ORAL | Status: DC
  Administered 2018-10-05 – 2018-10-08 (×4): 50 ug via ORAL
  Filled 2018-10-03 (×5): qty 1

## 2018-10-03 MED ORDER — APIXABAN 5 MG PO TABS
5.0000 mg | ORAL_TABLET | Freq: Two times a day (BID) | ORAL | Status: DC
  Administered 2018-10-04 – 2018-10-08 (×9): 5 mg via ORAL
  Filled 2018-10-03 (×9): qty 1

## 2018-10-03 MED ORDER — SODIUM CHLORIDE 0.9 % IV SOLN
INTRAVENOUS | Status: AC
  Administered 2018-10-04: 01:00:00 via INTRAVENOUS

## 2018-10-03 MED ORDER — ALBUTEROL SULFATE (2.5 MG/3ML) 0.083% IN NEBU: 2.5000 mg | INHALATION_SOLUTION | RESPIRATORY_TRACT | Status: DC | PRN

## 2018-10-03 MED ORDER — ACETAMINOPHEN 650 MG RE SUPP: 650.0000 mg | RECTAL | Status: DC | PRN

## 2018-10-03 MED ORDER — ASPIRIN 325 MG PO TABS: 325.0000 mg | ORAL_TABLET | Freq: Once | ORAL | Status: AC

## 2018-10-03 MED ORDER — INSULIN ASPART 100 UNIT/ML ~~LOC~~ SOLN
0.0000 [IU] | Freq: Three times a day (TID) | SUBCUTANEOUS | Status: DC
  Administered 2018-10-04: 18:00:00 5 [IU] via SUBCUTANEOUS
  Administered 2018-10-04: 12:00:00 2 [IU] via SUBCUTANEOUS
  Administered 2018-10-05: 12:00:00 3 [IU] via SUBCUTANEOUS
  Administered 2018-10-05: 18:00:00 5 [IU] via SUBCUTANEOUS
  Administered 2018-10-05: 03:00:00 3 [IU] via SUBCUTANEOUS
  Administered 2018-10-06: 06:00:00 5 [IU] via SUBCUTANEOUS
  Administered 2018-10-06: 12:00:00 7 [IU] via SUBCUTANEOUS
  Administered 2018-10-06: 17:00:00 5 [IU] via SUBCUTANEOUS
  Administered 2018-10-07 (×2): 3 [IU] via SUBCUTANEOUS
  Administered 2018-10-07: 5 [IU] via SUBCUTANEOUS
  Administered 2018-10-08 (×2): 3 [IU] via SUBCUTANEOUS

## 2018-10-03 MED ORDER — STROKE: EARLY STAGES OF RECOVERY BOOK
Freq: Once | Status: AC
  Administered 2018-10-03
  Filled 2018-10-03: qty 1

## 2018-10-03 MED ORDER — ATORVASTATIN CALCIUM 40 MG PO TABS
40.0000 mg | ORAL_TABLET | Freq: Every day | ORAL | Status: DC
  Administered 2018-10-04: 40 mg via ORAL
  Filled 2018-10-03: qty 1

## 2018-10-03 MED ORDER — INSULIN DETEMIR 100 UNIT/ML ~~LOC~~ SOLN
15.0000 [IU] | Freq: Two times a day (BID) | SUBCUTANEOUS | Status: DC
  Filled 2018-10-03 (×2): qty 0.15

## 2018-10-03 MED ORDER — ASPIRIN 300 MG RE SUPP
300.0000 mg | Freq: Once | RECTAL | Status: AC
  Administered 2018-10-03: 300 mg via RECTAL
  Filled 2018-10-03: qty 1

## 2018-10-03 MED ORDER — ACETAMINOPHEN 325 MG PO TABS
650.0000 mg | ORAL_TABLET | ORAL | Status: DC | PRN
  Administered 2018-10-06 – 2018-10-07 (×2): 650 mg via ORAL
  Filled 2018-10-03 (×2): qty 2

## 2018-10-03 MED ORDER — PANTOPRAZOLE SODIUM 40 MG PO TBEC
40.0000 mg | DELAYED_RELEASE_TABLET | Freq: Two times a day (BID) | ORAL | Status: DC
  Administered 2018-10-04 – 2018-10-08 (×9): 40 mg via ORAL
  Filled 2018-10-03 (×9): qty 1

## 2018-10-03 MED ORDER — PREGABALIN 50 MG PO CAPS
150.0000 mg | ORAL_CAPSULE | Freq: Every day | ORAL | Status: DC
  Administered 2018-10-04 – 2018-10-08 (×5): 150 mg via ORAL
  Filled 2018-10-03 (×5): qty 3

## 2018-10-03 MED ORDER — SENNOSIDES-DOCUSATE SODIUM 8.6-50 MG PO TABS: 1.0000 | ORAL_TABLET | Freq: Every evening | ORAL | Status: DC | PRN

## 2018-10-03 NOTE — ED Triage Notes (Signed)
[facility]- pt coming from home with complaint of right side facial droop, onset around 1500. Hx of stroke. Pt has reportedly also been dealing with vertigo. No distress noted on arrival. VSS with EMS.

## 2018-10-03 NOTE — ED Notes (Signed)
Son (husband of gwendolyn) Minneola Heindel- [phone]

## 2018-10-03 NOTE — H&P (Signed)
History and Physical    Casey Harrell:[id number] DOB: 1934/10/31 DOA: 10/03/2018  PCP: [name], MD  Patient coming from: Home via EMS  I have personally briefly reviewed patient's old medical records in [facility]  Chief Complaint: Right facial droop  HPI: Casey Harrell is a 84 y.o. female with medical history significant for PAF on Eliquis, Hx of CVA, IDT2DM, HTN, HLD, COPD, hypothyroidism, peripheral neuropathy, depression, and OSA who presents to the ED for evaluation of right facial droop.  Patient states she was in her usual state of health until around 3 PM on 10/03/2018.  She was sitting down on her couch when her son noticed that she had drooping of her right face and subsequently called EMS.  She had a headache at that time.  Patient denies any associated change in vision, slurred speech, dysphagia, chest pain, palpitations, dyspnea, abdominal pain, nausea, vomiting, diarrhea, dysuria, or focal weakness.  She says she normally ambulates with a cane but has not tried to walk since onset of her symptoms.  ED Course:  Initial vitals showed BP 157/67, pulse 55, RR 18, temp 97.7 Fahrenheit, SPO2 95% on room air.  Patient was brought to ED as a code stroke with neurology consultation.  CT head without contrast was negative for acute intracranial infarct or hemorrhage.  MRI brain and MRA head without contrast were obtained and showed an acute punctate 5 mm infarction in the right paramedian dorsal level of pons with extensive small vessel ischemic changes and old cortical infarcts in the occipital lobes, more extensive on the left than the right.  MRA did not show any acute large or medium vessel occlusion.  Labs were notable for WBC 10.6, hemoglobin 13.1, platelets 267,000, potassium 3.5, BUN 20, creatinine 1.05, INR 1.4.  SARS-CoV-2 test was obtained and pending.   Review of Systems: All systems reviewed and are negative except as documented in history of present illness  above.   Past Medical History:  Diagnosis Date  . ASTHMA 07/15/2008  . COPD (chronic obstructive pulmonary disease) (HCC)   . DIABETES-TYPE 2 07/15/2008  . DIABETIC PERIPHERAL NEUROPATHY 09/02/2009  . GERD 07/15/2008  . HYPERTENSION 07/15/2008  . Normal cardiac stress test    low risk Nuc 2009, Feb 2011  . Paroxysmal atrial fibrillation (HCC)   . PVD (peripheral vascular disease) (HCC) 06/18/09   RCE  . Stroke (HCC)   . Vertigo     Past Surgical History:  Procedure Laterality Date  . ABDOMINAL HYSTERECTOMY  1971  . APPENDECTOMY  1971  . CAROTID ENDARTERECTOMY  06/18/09   RCE  . CESAREAN SECTION    . colectomy and colostomy    . FLEXIBLE SIGMOIDOSCOPY     diverticulitis  . FOOT SURGERY     right foot X 2  . LAPAROTOMY     X 3 with adhesions lysis  . TONSILLECTOMY    . TUBAL LIGATION      Social History:  reports that she has never smoked. She has never used smokeless tobacco. She reports that she does not drink alcohol or use drugs.  Allergies  Allergen Reactions  . Doxycycline Hyclate Nausea And Vomiting  . Morphine Sulfate Other (See Comments)    REACTION: hallucinations  . Iohexol Hives     Code: HIVES, Desc: pt. states she breaks out in hives 06/15/08   . Moxifloxacin Other (See Comments)    REACTION: unknown    Family History  Problem Relation Age of Onset  .  Diabetes Mother   . Arthritis Mother   . Heart disease Mother   . Heart disease Father   . Heart disease Sister   . Heart disease Brother   . Cancer Brother 60       lung cancer  . Celiac disease Sister   . Stroke Maternal Grandfather   . Cancer Paternal Grandfather        lung  . Heart disease Sister   . Asthma Sister      Prior to Admission medications   Medication Sig Start Date End Date Taking? Authorizing Provider  ACCU-CHEK AVIVA PLUS test strip TEST ONCE DAILY. DX E11.9 12/25/17   Burchette, Elberta Fortis, MD  acetaminophen (TYLENOL) 325 MG tablet Take 650 mg by mouth every 6 (six) hours as  needed (pain).    [provider]  albuterol (ACCUNEB) 1.25 MG/3ML nebulizer solution Take 3 mLs (1.25 mg total) by nebulization every 4 (four) hours as needed for wheezing. Reported on 04/26/2015 05/13/15   Jeralyn Bennett, MD  Albuterol Sulfate (PROAIR RESPICLICK) 108 (90 Base) MCG/ACT AEPB Inhale 2 puffs into the lungs every 4 (four) hours as needed. 06/24/18   Burchette, Elberta Fortis, MD  amLODipine (NORVASC) 5 MG tablet Take 1 tablet (5 mg total) by mouth daily. 03/19/18   Burchette, Elberta Fortis, MD  atorvastatin (LIPITOR) 40 MG tablet TAKE 1 TABLET BY MOUTH EVERY DAY 04/23/18   Burchette, Elberta Fortis, MD  CALCIUM PO Take 1 tablet by mouth daily.    [provider]  carvedilol (COREG) 6.25 MG tablet TAKE 1 TABLET BY MOUTH TWICE A DAY WITH MEALS 07/22/18   Burchette, Elberta Fortis, MD  Cholecalciferol (VITAMIN D3) 2000 units TABS Take 2,000 Units by mouth daily. Reported on 04/26/2015 04/13/17   Angiulli, Mcarthur Rossetti, PA-C  diclofenac sodium (VOLTAREN) 1 % GEL APPLY 2GRAMS TOPICALLY 4 TIMES DAILY TO RIGHT ANKLE 05/11/17   Burchette, Elberta Fortis, MD  DULoxetine (CYMBALTA) 30 MG capsule TAKE 1 CAPSULE (30MG ) TWICE DAILY. 09/23/18   Burchette, Elberta Fortis, MD  ELIQUIS 5 MG TABS tablet TAKE 1 TABLET BY MOUTH TWICE A DAY 10/01/18   Burchette, Elberta Fortis, MD  glucose blood (ACCU-CHEK AVIVA PLUS) test strip Test twice daily.  Dx e11.9 07/11/17   [name], MD  HUMALOG KWIKPEN 100 UNIT/ML KwikPen INJECT 9-14 UNITS SUBCUTANEOUSLY TWICE DAILY WITH MEAL (9 UNITS BREAKFAST AND 14 UNITS AFTER SUPPER) 08/20/18   Burchette, Elberta Fortis, MD  Insulin Detemir (LEVEMIR FLEXTOUCH) 100 UNIT/ML Pen INJECT 40 UNITS INTO THE SKIN TWICE A DAY 04/13/17   Angiulli, Mcarthur Rossetti, PA-C  ipratropium-albuterol (DUONEB) 0.5-2.5 (3) MG/3ML SOLN Take 3 mLs by nebulization every 6 (six) hours as needed (shortness of breath or wheezing). 04/20/15   Love, Evlyn Kanner, PA-C  irbesartan (AVAPRO) 150 MG tablet TAKE 1 TABLET BY MOUTH EVERY DAY 07/22/18   Burchette,  Elberta Fortis, MD  LEVEMIR FLEXTOUCH 100 UNIT/ML Pen INJECT 40 UNITS INTO THE SKIN TWICE A DAY 08/20/18   Burchette, Elberta Fortis, MD  levothyroxine (SYNTHROID) 50 MCG tablet TAKE 1 TABLET BY MOUTH EVERY DAY BEFORE BREAKFAST 09/09/18   Burchette, Elberta Fortis, MD  LORazepam (ATIVAN) 0.5 MG tablet TAKE 1 TABLET (0.5 MG TOTAL) BY MOUTH EVERY 8 (EIGHT) HOURS AS NEEDED FOR ANXIETY. 02/12/18   Burchette, Elberta Fortis, MD  LYRICA 150 MG capsule TAKE ONE CAPSULE BY MOUTH EVERY DAY 02/26/18   Burchette, Elberta Fortis, MD  metFORMIN (GLUCOPHAGE) 500 MG tablet Take 1/2 tablet by mouth twice a  day with a meal. 09/24/18   Burchette, Elberta Fortis, MD  ondansetron (ZOFRAN-ODT) 4 MG disintegrating tablet Take 1 tablet (4 mg total) by mouth every 8 (eight) hours as needed for nausea or vomiting. 11/14/17   Burchette, Elberta Fortis, MD  ONE TOUCH ULTRA TEST test strip TEST TWICE DAILY 04/12/18   Burchette, Elberta Fortis, MD  pantoprazole (PROTONIX) 40 MG tablet TAKE 1 TABLET BY MOUTH TWICE A DAY 02/12/18   Burchette, Elberta Fortis, MD  traZODone (DESYREL) 50 MG tablet TAKE 1/2 TO 1 TABLET BY MOUTH AT BEDTIME AS NEEDED FOR SLEEP 05/20/18   [name], MD    Physical Exam: Vitals:   10/03/18 2132 10/03/18 2133 10/03/18 2153 10/03/18 2215  BP:   (!) 157/67 119/66  Pulse:   (!) 51 (!) 54  Resp:   18 17  Temp:      SpO2:   95% 96%  Weight: 61.7 kg 56.7 kg    Height:   (1.549 m)      Constitutional: Elderly woman resting supine in bed, NAD, calm, comfortable Eyes: PERRL, lids and conjunctivae normal.  Does not track laterally to the right on EOM examination. ENMT: Mucous membranes are moist. Posterior pharynx clear of any exudate or lesions.Normal dentition.  Neck: normal, supple, no masses. Respiratory: clear to auscultation bilaterally, no wheezing, no crackles. Normal respiratory effort. No accessory muscle use.  Cardiovascular: Regular rate and rhythm, no murmurs / rubs / gallops. No extremity edema.  Abdomen: no tenderness, no masses palpated. No  hepatosplenomegaly. Bowel sounds positive.  Musculoskeletal: no clubbing / cyanosis. No joint deformity upper and lower extremities. Good ROM, no contractures. Normal muscle tone.  Skin: no rashes, lesions, ulcers. No induration Neurologic: Right facial droop present and right lateral EOM tracking impaired.  Slight ataxia on the right.  Sensation intact, DTR normal. Strength 5/5 in all 4.  Gait not assessed. Psychiatric: Normal judgment and insight. Alert and oriented x 3. Normal mood.    Labs on Admission: I have personally reviewed following labs and imaging studies  CBC: Recent Labs  Lab 10/03/18 2017 10/03/18 2021  WBC 10.6*  --   NEUTROABS 4.9  --   HGB 13.1 13.9  HCT 39.9 41.0  MCV 90.7  --   PLT 267  --    Basic Metabolic Panel: Recent Labs  Lab 10/03/18 2017 10/03/18 2021  NA 136 137  K 3.5 3.5  CL 104 103  CO2 22  --   GLUCOSE 133* 131*  BUN 20 22  CREATININE 1.05* 1.00  CALCIUM 9.6  --    GFR: Estimated Creatinine Clearance: 33.3 mL/min (by C-G formula based on SCr of 1 mg/dL). Liver Function Tests: Recent Labs  Lab 10/03/18 2017  AST 15  ALT 12  ALKPHOS 96  BILITOT 0.5  PROT 7.0  ALBUMIN 3.7   No results for input(s): LIPASE, AMYLASE in the last 168 hours. No results for input(s): AMMONIA in the last 168 hours. Coagulation Profile: Recent Labs  Lab 10/03/18 2017  INR 1.4*   Cardiac Enzymes: No results for input(s): CKTOTAL, CKMB, CKMBINDEX, TROPONINI in the last 168 hours. BNP (last 3 results) No results for input(s): PROBNP in the last 8760 hours. HbA1C: No results for input(s): HGBA1C in the last 72 hours. CBG: No results for input(s): GLUCAP in the last 168 hours. Lipid Profile: No results for input(s): CHOL, HDL, LDLCALC, TRIG, CHOLHDL, LDLDIRECT in the last 72 hours. Thyroid Function Tests: No results for input(s): TSH,  T4TOTAL, FREET4, T3FREE, THYROIDAB in the last 72 hours. Anemia Panel: No results for input(s): VITAMINB12,  FOLATE, FERRITIN, TIBC, IRON, RETICCTPCT in the last 72 hours. Urine analysis:    Component Value Date/Time   COLORURINE YELLOW 04/04/2017 1104   APPEARANCEUR HAZY (A) 04/04/2017 1104   LABSPEC 1.012 04/04/2017 1104   PHURINE 6.0 04/04/2017 1104   GLUCOSEU 50 (A) 04/04/2017 1104   HGBUR NEGATIVE 04/04/2017 1104   BILIRUBINUR neg 10/15/2017 1527   KETONESUR NEGATIVE 04/04/2017 1104   PROTEINUR Positive (A) 10/15/2017 1527   PROTEINUR NEGATIVE 04/04/2017 1104   UROBILINOGEN 0.2 10/15/2017 1527   UROBILINOGEN 0.2 11/05/2014 1555   NITRITE neg 10/15/2017 1527   NITRITE NEGATIVE 04/04/2017 1104   LEUKOCYTESUR Negative 10/15/2017 1527    Radiological Exams on Admission: Mr Maxine GlennMra Head Wo Contrast  Result Date: 10/03/2018 CLINICAL DATA:  Code stroke.  Acute facial droop. EXAM: MRI HEAD WITHOUT CONTRAST MRA HEAD WITHOUT CONTRAST TECHNIQUE: Multiplanar, multiecho pulse sequences of the brain and surrounding structures were obtained without intravenous contrast. Angiographic images of the head were obtained using MRA technique without contrast. COMPARISON:  Head CT same day.  MRI 03/30/2017 FINDINGS: MRI HEAD FINDINGS Brain: Acute 5 mm infarction in the right paramedian dorsal lower pons. No other acute insult. Elsewhere, there are chronic small-vessel ischemic changes affecting the pons, with a few small foci of hemosiderin deposition. There are old small vessel infarctions within the cerebellum, some with hemosiderin deposition. There is extensive chronic small vessel ischemic change throughout the cerebral hemispheric white matter. There are old occipital cortical and subcortical infarctions, more extensive on the left than the right. There are old lacunar infarctions affecting the thalami and basal ganglia. No mass lesion. No hydrocephalus or extra-axial collection. Vascular: There is atherosclerotic calcification of the major vessels at the base of the brain. Skull and upper cervical spine: Negative  Sinuses/Orbits: Clear/normal Other: None MRA HEAD FINDINGS Both internal carotid arteries are patent through the skull base and siphon regions. The anterior and middle cerebral vessels are patent. There are multiple intracranial stenoses and areas of irregularity, more extensive in the left anterior and middle cerebral vessels than right. Both vertebral arteries are patent to the basilar. 50% stenosis of the V4 segment on the left. Large right PICA. Basilar artery shows mild atherosclerotic irregularity but no flow limiting stenosis. There is flow within the superior cerebellar and posterior cerebral arteries, but the vessels show pronounced atherosclerotic irregularity and narrowing. IMPRESSION: Acute punctate 5 mm infarction in the right para median dorsal level or pons. Extensive small vessel ischemic changes elsewhere throughout the brainstem, cerebellum, thalami, basal ganglia and hemispheric white matter, some with old hemosiderin deposition. Old cortical infarctions in the occipital lobes, more extensive on the left than the right. MR angiography does not show any acute large or medium vessel occlusion. There is advanced diffuse intracranial atherosclerotic disease, most pronounced in the left anterior and middle cerebral artery territories and in the posterior circulation branch vessels Electronically Signed   By: Paulina FusiMark  Shogry M.D.   On: 10/03/2018 21:22   Mr Brain Wo Contrast  Result Date: 10/03/2018 CLINICAL DATA:  Code stroke.  Acute facial droop. EXAM: MRI HEAD WITHOUT CONTRAST MRA HEAD WITHOUT CONTRAST TECHNIQUE: Multiplanar, multiecho pulse sequences of the brain and surrounding structures were obtained without intravenous contrast. Angiographic images of the head were obtained using MRA technique without contrast. COMPARISON:  Head CT same day.  MRI 03/30/2017 FINDINGS: MRI HEAD FINDINGS Brain: Acute 5 mm infarction in the  right paramedian dorsal lower pons. No other acute insult. Elsewhere, there  are chronic small-vessel ischemic changes affecting the pons, with a few small foci of hemosiderin deposition. There are old small vessel infarctions within the cerebellum, some with hemosiderin deposition. There is extensive chronic small vessel ischemic change throughout the cerebral hemispheric white matter. There are old occipital cortical and subcortical infarctions, more extensive on the left than the right. There are old lacunar infarctions affecting the thalami and basal ganglia. No mass lesion. No hydrocephalus or extra-axial collection. Vascular: There is atherosclerotic calcification of the major vessels at the base of the brain. Skull and upper cervical spine: Negative Sinuses/Orbits: Clear/normal Other: None MRA HEAD FINDINGS Both internal carotid arteries are patent through the skull base and siphon regions. The anterior and middle cerebral vessels are patent. There are multiple intracranial stenoses and areas of irregularity, more extensive in the left anterior and middle cerebral vessels than right. Both vertebral arteries are patent to the basilar. 50% stenosis of the V4 segment on the left. Large right PICA. Basilar artery shows mild atherosclerotic irregularity but no flow limiting stenosis. There is flow within the superior cerebellar and posterior cerebral arteries, but the vessels show pronounced atherosclerotic irregularity and narrowing. IMPRESSION: Acute punctate 5 mm infarction in the right para median dorsal level or pons. Extensive small vessel ischemic changes elsewhere throughout the brainstem, cerebellum, thalami, basal ganglia and hemispheric white matter, some with old hemosiderin deposition. Old cortical infarctions in the occipital lobes, more extensive on the left than the right. MR angiography does not show any acute large or medium vessel occlusion. There is advanced diffuse intracranial atherosclerotic disease, most pronounced in the left anterior and middle cerebral artery  territories and in the posterior circulation branch vessels Electronically Signed   By: Paulina Fusi M.D.   On: 10/03/2018 21:22   Ct Head Code Stroke Wo Contrast  Result Date: 10/03/2018 CLINICAL DATA:  Code stroke. Initial evaluation for acute facial droop. EXAM: CT HEAD WITHOUT CONTRAST TECHNIQUE: Contiguous axial images were obtained from the base of the skull through the vertex without intravenous contrast. COMPARISON:  Previous MRI from 03/30/2017 FINDINGS: Brain: Generalized age-related cerebral volume loss with moderate chronic microvascular ischemic disease. Encephalomalacia within the bilateral occipital lobes compatible with remote ischemic infarcts. Additional small remote left basal ganglia lacunar infarct, with additional tiny remote left cerebellar infarct. No acute intracranial hemorrhage. No acute large vessel territory infarct. No mass lesion, midline shift or mass effect. No hydrocephalus. No extra-axial fluid collection. Vascular: No hyperdense vessel. Scattered vascular calcifications noted within the carotid siphons. Skull: Scalp soft tissues and calvarium within normal limits. Sinuses/Orbits: Globes and orbital soft tissues normal. Mild-to-moderate circumferential mucosal thickening within the right sphenoid sinus. Paranasal sinuses are otherwise clear. Small right mastoid effusion noted. Other: None. ASPECTS [facility] Stroke Program Early CT Score) - Ganglionic level infarction (caudate, lentiform nuclei, internal capsule, insula, M1-M3 cortex): 7 - Supraganglionic infarction (M4-M6 cortex): 3 Total score (0-10 with 10 being normal): 10 IMPRESSION: 1. No acute intracranial infarct or other abnormality identified. 2. ASPECTS is 10. 3. Multiple chronic ischemic infarcts involving the left basal angling the and bilateral occipital lobes. 4. Underlying age-related cerebral atrophy with moderate chronic small vessel ischemic disease. These results were communicated to Dr. Amada Jupiter at 8:42  pmon 5/28/2020by text page via the [facility] messaging system. Electronically Signed   By: Rise Mu M.D.   On: 10/03/2018 20:44    EKG: Independently reviewed. Sinus rhythm, rate 60 bpm, borderline prolonged  PR interval.  No acute ischemic changes.  Assessment/Plan Principal Problem:   Acute ischemic stroke (HCC) Active Problems:   Hypothyroidism   Type 2 diabetes mellitus, uncontrolled (HCC)   Dyslipidemia   Essential hypertension   Sleep apnea- C-pap intol   Paroxysmal atrial fibrillation (HCC)   COPD (chronic obstructive pulmonary disease) (HCC)  Casey Harrell is a 84 y.o. female with medical history significant for PAF on Eliquis, Hx of CVA, IDT2DM, HTN, HLD, COPD, hypothyroidism, peripheral neuropathy, depression, and OSA who is admitted with an acute right brainstem infarction.   Acute right brainstem infarction: Seen on MRI brain.  No large or medium vessel occlusion seen on MRA. -Admit to telemetry -Give aspirin 325 mg p.o. or 300 mg PR once now, start aspirin 81 mg daily tomorrow -Resume home atorvastatin 40 mg daily -Check lipid panel, A1c -Obtain echocardiogram and carotid Dopplers -PT/OT/SLP eval -Continue neurochecks -Allow permissive hypertension  Paroxysmal atrial fibrillation: CHA2DS2-VASc Score is at least 7.  She appears to be on Eliquis for anticoagulation as an outpatient.  She is in sinus rhythm with controlled rate on admission. -Continue Eliquis -Holding home Coreg for permissive hypertension  Insulin-dependent type 2 diabetes: Last A1c 11.1 on 01/03/2018.  Outpatient regimen includes Levemir 40 units twice daily, Humalog 9-14 units twice daily with meals, and metformin. -Reduce Levemir to 15 units twice daily and sensitive SSI while in hospital, adjust as needed -Check A1c  Hypertension: Allowing permissive hypertension as above.  Holding home amlodipine, Coreg, irbesartan.  Hyperlipidemia: -Continue atorvastatin  COPD: Chronic and stable  without wheezing on exam. -As needed nebulizers  Hypothyroidism: -Continue Synthroid  GERD: -Continue Protonix  Depression/anxiety: -Continue duloxetine  OSA: Has been intolerant to CPAP.  Continue supplemental oxygen.  DVT prophylaxis: Eliquis Code Status: DNR, confirmed with patient Family Communication: Attempted to call son x2 without answer Disposition Plan: Pending stroke work-up and PT/OT/SLP eval Consults called: Neurology Admission status: Inpatient, patient with MRI confirmed right brainstem infarct requiring admission for further stroke evaluation and management.   Darreld Mclean MD [facility]  If 7PM-7AM, please contact night-coverage [email or web address]  10/03/2018, 10:39 PM

## 2018-10-03 NOTE — ED Provider Notes (Addendum)
[facility] EMERGENCY DEPARTMENT Provider Note   CSN: [id number] Arrival date & time: 10/03/18  2013  An emergency department physician performed an initial assessment on this suspected stroke patient at 2016.  History   Chief Complaint Chief Complaint  Patient presents with  . Code Stroke    HPI Casey Harrell is a 84 y.o. female.     HPI Patient presents as a code stroke.  Met upon arrival by Dr. Leonel Ramsay and in the CAT scanner by myself.  Had right-sided facial droop that started around 3:00.  Does have previous stroke.  Has had some vertigo.  History of atrial fibrillation and previous strokes.  Is apparently on anticoagulation.  Not a TPA candidate due to time of onset.  No headache.  No confusion. Past Medical History:  Diagnosis Date  . ASTHMA 07/15/2008  . COPD (chronic obstructive pulmonary disease) ([place])   . DIABETES-TYPE 2 07/15/2008  . DIABETIC PERIPHERAL NEUROPATHY 09/02/2009  . GERD 07/15/2008  . HYPERTENSION 07/15/2008  . Normal cardiac stress test    low risk Nuc 2009, Feb 2011  . Paroxysmal atrial fibrillation (HCC)   . PVD (peripheral vascular disease) ([place]) 06/18/09   RCE  . Stroke ([place])   . Vertigo     Patient Active Problem List   Diagnosis Date Noted  . 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 Left pontine cerebrovascular accident ([place]) 04/03/2017  . History of CVA with residual deficit   . Type 2 diabetes mellitus with peripheral neuropathy (HCC)   . Anemia 03/30/2017  . History of stroke   . Chest pain 02/09/2016  . BPPV (benign paroxysmal positional vertigo) 08/25/2015  . HLD (hyperlipidemia) 08/25/2015  . At high risk for falls 07/01/2015  . Vertigo 06/21/2015  . Paroxysmal atrial fibrillation ([place]) 06/11/2015  . Cerebral infarction due to stenosis of left middle cerebral artery ([place]) 05/20/2015  . Intracranial vascular stenosis 05/20/2015  . Type 2 diabetes mellitus with circulatory disorder ([place]) 05/20/2015   . Type 2 diabetes, uncontrolled, with neuropathy ([place])   . Abdominal pain   . Partial small bowel obstruction ([place])   . Benign essential HTN   . Small bowel obstruction ([place]) 05/07/2015  . Hemiplegia, unspecified affecting right dominant side ([place]) 04/08/2015  . Embolic stroke involving posterior cerebral artery ([place]) 04/05/2015  . Orthostatic hypotension   . Hypertensive urgency 04/01/2015  . Fever in adult 04/01/2015  . Acute encephalopathy 04/01/2015  . Confusion   . CVA (cerebral infarction)   . Posterior circulation stroke ([place])   . Acute ischemic stroke ([place]) 03/31/2015  . Stroke ([place]) 03/31/2015  . CKD (chronic kidney disease) stage 3, GFR 30-59 ml/min (HCC) 04/17/2014  . Gastroenteritis, non-infectious 04/11/2014  . Nausea vomiting and diarrhea 04/10/2014  . Hypokalemia 04/10/2014  . Dehydration 04/09/2014  . Bronchitis with airway obstruction ([place]) 02/03/2013  . Leukocytosis 02/03/2013  . Fever 02/03/2013  . Acute respiratory failure with hypoxia ([place]) 02/03/2013  . Depression 02/03/2013  . Hyponatremia 02/03/2013  . Obesity (BMI 30-39.9) 01/10/2013  . PVD (peripheral vascular disease)- S/P RCE 06/18/09 12/13/2012  . Sleep apnea- C-pap intol 12/13/2012  . Low risk cardiac nuclear stress test- 2009 and Feb 2011 12/13/2012  . Mild persistent asthma 11/29/2011  . Chronic cough 06/05/2011  . Diabetic neuropathy ([place]) 09/02/2009  . Dyslipidemia 07/13/2009  . Hypothyroidism 11/12/2008  . Type 2 diabetes mellitus, uncontrolled ([place]) 07/15/2008  . Essential hypertension 07/15/2008  . GERD 07/15/2008  . DIVERTICULOSIS, COLON 07/15/2008  Past Surgical History:  Procedure Laterality Date  . ABDOMINAL HYSTERECTOMY  1971  . APPENDECTOMY  1971  . CAROTID ENDARTERECTOMY  06/18/09   RCE  . CESAREAN SECTION    . colectomy and colostomy    . FLEXIBLE SIGMOIDOSCOPY     diverticulitis  . FOOT SURGERY     right foot X 2  . LAPAROTOMY     X 3 with adhesions lysis  .  TONSILLECTOMY    . TUBAL LIGATION       OB History   No obstetric history on file.      Home Medications    Prior to Admission medications   Medication Sig Start Date End Date Taking? Authorizing Provider  ACCU-CHEK AVIVA PLUS test strip TEST ONCE DAILY. DX E11.9 12/25/17   Burchette, Alinda Sierras, MD  acetaminophen (TYLENOL) 325 MG tablet Take 650 mg by mouth every 6 (six) hours as needed (pain).    [provider]  albuterol (ACCUNEB) 1.25 MG/3ML nebulizer solution Take 3 mLs (1.25 mg total) by nebulization every 4 (four) hours as needed for wheezing. Reported on 04/26/2015 05/13/15   Kelvin Cellar, MD  Albuterol Sulfate (PROAIR RESPICLICK) 277 (90 Base) MCG/ACT AEPB Inhale 2 puffs into the lungs every 4 (four) hours as needed. 06/24/18   Burchette, Alinda Sierras, MD  amLODipine (NORVASC) 5 MG tablet Take 1 tablet (5 mg total) by mouth daily. 03/19/18   Burchette, Alinda Sierras, MD  atorvastatin (LIPITOR) 40 MG tablet TAKE 1 TABLET BY MOUTH EVERY DAY 04/23/18   Burchette, Alinda Sierras, MD  CALCIUM PO Take 1 tablet by mouth daily.    [provider]  carvedilol (COREG) 6.25 MG tablet TAKE 1 TABLET BY MOUTH TWICE A DAY WITH MEALS 07/22/18   Burchette, Alinda Sierras, MD  Cholecalciferol (VITAMIN D3) 2000 units TABS Take 2,000 Units by mouth daily. Reported on 04/26/2015 04/13/17   Angiulli, Lavon Paganini, PA-C  diclofenac sodium (VOLTAREN) 1 % GEL APPLY 2GRAMS TOPICALLY 4 TIMES DAILY TO RIGHT ANKLE 05/11/17   Burchette, Alinda Sierras, MD  DULoxetine (CYMBALTA) 30 MG capsule TAKE 1 CAPSULE (30MG) TWICE DAILY. 09/23/18   Burchette, Alinda Sierras, MD  ELIQUIS 5 MG TABS tablet TAKE 1 TABLET BY MOUTH TWICE A DAY 10/01/18   Burchette, Alinda Sierras, MD  glucose blood (ACCU-CHEK AVIVA PLUS) test strip Test twice daily.  Dx e11.9 07/11/17   Eulas Post, MD  HUMALOG KWIKPEN 100 UNIT/ML KwikPen INJECT 9-14 UNITS SUBCUTANEOUSLY TWICE DAILY WITH MEAL (9 UNITS BREAKFAST AND 14 UNITS AFTER SUPPER) 08/20/18   Burchette, Alinda Sierras, MD   Insulin Detemir (LEVEMIR FLEXTOUCH) 100 UNIT/ML Pen INJECT 40 UNITS INTO THE SKIN TWICE A DAY 04/13/17   Angiulli, Lavon Paganini, PA-C  ipratropium-albuterol (DUONEB) 0.5-2.5 (3) MG/3ML SOLN Take 3 mLs by nebulization every 6 (six) hours as needed (shortness of breath or wheezing). 04/20/15   Love, Ivan Anchors, PA-C  irbesartan (AVAPRO) 150 MG tablet TAKE 1 TABLET BY MOUTH EVERY DAY 07/22/18   Burchette, Alinda Sierras, MD  LEVEMIR FLEXTOUCH 100 UNIT/ML Pen INJECT 40 UNITS INTO THE SKIN TWICE A DAY 08/20/18   Burchette, Alinda Sierras, MD  levothyroxine (SYNTHROID) 50 MCG tablet TAKE 1 TABLET BY MOUTH EVERY DAY BEFORE BREAKFAST 09/09/18   Burchette, Alinda Sierras, MD  LORazepam (ATIVAN) 0.5 MG tablet TAKE 1 TABLET (0.5 MG TOTAL) BY MOUTH EVERY 8 (EIGHT) HOURS AS NEEDED FOR ANXIETY. 02/12/18   Burchette, Alinda Sierras, MD  LYRICA 150 MG capsule TAKE ONE CAPSULE BY MOUTH  EVERY DAY 02/26/18   Burchette, Alinda Sierras, MD  metFORMIN (GLUCOPHAGE) 500 MG tablet Take 1/2 tablet by mouth twice a day with a meal. 09/24/18   Burchette, Alinda Sierras, MD  ondansetron (ZOFRAN-ODT) 4 MG disintegrating tablet Take 1 tablet (4 mg total) by mouth every 8 (eight) hours as needed for nausea or vomiting. 11/14/17   Burchette, Alinda Sierras, MD  ONE TOUCH ULTRA TEST test strip TEST TWICE DAILY 04/12/18   Burchette, Alinda Sierras, MD  pantoprazole (PROTONIX) 40 MG tablet TAKE 1 TABLET BY MOUTH TWICE A DAY 02/12/18   Burchette, Alinda Sierras, MD  traZODone (DESYREL) 50 MG tablet TAKE 1/2 TO 1 TABLET BY MOUTH AT BEDTIME AS NEEDED FOR SLEEP 05/20/18   Burchette, Alinda Sierras, MD    Family History Family History  Problem Relation Age of Onset  . Diabetes Mother   . Arthritis Mother   . Heart disease Mother   . Heart disease Father   . Heart disease Sister   . Heart disease Brother   . Cancer Brother 60       lung cancer  . Celiac disease Sister   . Stroke Maternal Grandfather   . Cancer Paternal Grandfather        lung  . Heart disease Sister   . Asthma Sister     Social  History Social History   Tobacco Use  . Smoking status: Never Smoker  . Smokeless tobacco: Never Used  Substance Use Topics  . Alcohol use: No  . Drug use: No     Allergies   Doxycycline hyclate; Morphine sulfate; Iohexol; and Moxifloxacin   Review of Systems Review of Systems  Constitutional: Negative for appetite change.  HENT: Negative for facial swelling.   Respiratory: Negative for shortness of breath.   Cardiovascular: Negative for chest pain.  Gastrointestinal: Negative for abdominal pain.  Musculoskeletal: Negative for back pain.  Skin: Negative for rash.  Neurological: Positive for weakness.       Facial droop.     Physical Exam Updated Vital Signs BP (!) 157/67   Pulse (!) 51   Temp 97.7 F (36.5 C)   Resp 18   Ht 5' 1" (1.549 m)   Wt 56.7 kg   SpO2 95%   BMI 23.62 kg/m   Physical Exam Vitals signs and nursing note reviewed.  Eyes:     Comments: Patient will not look to right.  Cardiovascular:     Rate and Rhythm: Normal rate.  Abdominal:     Tenderness: There is no abdominal tenderness.  Neurological:     Mental Status: She is alert.     Comments: Complete NIH scoring done by neurology.  Patient will not look to right and does have ataxia on the right side.  Right-sided facial droop.      ED Treatments / Results  Labs (all labs ordered are listed, but only abnormal results are displayed) Labs Reviewed  PROTIME-INR - Abnormal; Notable for the following components:      Result Value   Prothrombin Time 16.9 (*)    INR 1.4 (*)    All other components within normal limits  CBC - Abnormal; Notable for the following components:   WBC 10.6 (*)    All other components within normal limits  DIFFERENTIAL - Abnormal; Notable for the following components:   Lymphs Abs 4.1 (*)    Monocytes Absolute 1.1 (*)    All other components within normal limits  COMPREHENSIVE METABOLIC PANEL - Abnormal; Notable  for the following components:   Glucose, Bld  133 (*)    Creatinine, Ser 1.05 (*)    GFR calc non Af Amer 50 (*)    GFR calc Af Amer 58 (*)    All other components within normal limits  I-STAT CHEM 8, ED - Abnormal; Notable for the following components:   Glucose, Bld 131 (*)    All other components within normal limits  SARS CORONAVIRUS 2 (HOSPITAL ORDER, [facility])  ETHANOL  APTT  RAPID URINE DRUG SCREEN, HOSP PERFORMED  URINALYSIS, ROUTINE W REFLEX MICROSCOPIC    EKG None  Radiology Mr Jodene Nam Head Wo Contrast  Result Date: 10/03/2018 CLINICAL DATA:  Code stroke.  Acute facial droop. EXAM: MRI HEAD WITHOUT CONTRAST MRA HEAD WITHOUT CONTRAST TECHNIQUE: Multiplanar, multiecho pulse sequences of the brain and surrounding structures were obtained without intravenous contrast. Angiographic images of the head were obtained using MRA technique without contrast. COMPARISON:  Head CT same day.  MRI 03/30/2017 FINDINGS: MRI HEAD FINDINGS Brain: Acute 5 mm infarction in the right paramedian dorsal lower pons. No other acute insult. Elsewhere, there are chronic small-vessel ischemic changes affecting the pons, with a few small foci of hemosiderin deposition. There are old small vessel infarctions within the cerebellum, some with hemosiderin deposition. There is extensive chronic small vessel ischemic change throughout the cerebral hemispheric white matter. There are old occipital cortical and subcortical infarctions, more extensive on the left than the right. There are old lacunar infarctions affecting the thalami and basal ganglia. No mass lesion. No hydrocephalus or extra-axial collection. Vascular: There is atherosclerotic calcification of the major vessels at the base of the brain. Skull and upper cervical spine: Negative Sinuses/Orbits: Clear/normal Other: None MRA HEAD FINDINGS Both internal carotid arteries are patent through the skull base and siphon regions. The anterior and middle cerebral vessels are patent. There  are multiple intracranial stenoses and areas of irregularity, more extensive in the left anterior and middle cerebral vessels than right. Both vertebral arteries are patent to the basilar. 50% stenosis of the V4 segment on the left. Large right PICA. Basilar artery shows mild atherosclerotic irregularity but no flow limiting stenosis. There is flow within the superior cerebellar and posterior cerebral arteries, but the vessels show pronounced atherosclerotic irregularity and narrowing. IMPRESSION: Acute punctate 5 mm infarction in the right para median dorsal level or pons. Extensive small vessel ischemic changes elsewhere throughout the brainstem, cerebellum, thalami, basal ganglia and hemispheric white matter, some with old hemosiderin deposition. Old cortical infarctions in the occipital lobes, more extensive on the left than the right. MR angiography does not show any acute large or medium vessel occlusion. There is advanced diffuse intracranial atherosclerotic disease, most pronounced in the left anterior and middle cerebral artery territories and in the posterior circulation branch vessels Electronically Signed   By: Nelson Chimes M.D.   On: 10/03/2018 21:22   Mr Brain Wo Contrast  Result Date: 10/03/2018 CLINICAL DATA:  Code stroke.  Acute facial droop. EXAM: MRI HEAD WITHOUT CONTRAST MRA HEAD WITHOUT CONTRAST TECHNIQUE: Multiplanar, multiecho pulse sequences of the brain and surrounding structures were obtained without intravenous contrast. Angiographic images of the head were obtained using MRA technique without contrast. COMPARISON:  Head CT same day.  MRI 03/30/2017 FINDINGS: MRI HEAD FINDINGS Brain: Acute 5 mm infarction in the right paramedian dorsal lower pons. No other acute insult. Elsewhere, there are chronic small-vessel ischemic changes affecting the pons, with a few small foci of hemosiderin  deposition. There are old small vessel infarctions within the cerebellum, some with hemosiderin  deposition. There is extensive chronic small vessel ischemic change throughout the cerebral hemispheric white matter. There are old occipital cortical and subcortical infarctions, more extensive on the left than the right. There are old lacunar infarctions affecting the thalami and basal ganglia. No mass lesion. No hydrocephalus or extra-axial collection. Vascular: There is atherosclerotic calcification of the major vessels at the base of the brain. Skull and upper cervical spine: Negative Sinuses/Orbits: Clear/normal Other: None MRA HEAD FINDINGS Both internal carotid arteries are patent through the skull base and siphon regions. The anterior and middle cerebral vessels are patent. There are multiple intracranial stenoses and areas of irregularity, more extensive in the left anterior and middle cerebral vessels than right. Both vertebral arteries are patent to the basilar. 50% stenosis of the V4 segment on the left. Large right PICA. Basilar artery shows mild atherosclerotic irregularity but no flow limiting stenosis. There is flow within the superior cerebellar and posterior cerebral arteries, but the vessels show pronounced atherosclerotic irregularity and narrowing. IMPRESSION: Acute punctate 5 mm infarction in the right para median dorsal level or pons. Extensive small vessel ischemic changes elsewhere throughout the brainstem, cerebellum, thalami, basal ganglia and hemispheric white matter, some with old hemosiderin deposition. Old cortical infarctions in the occipital lobes, more extensive on the left than the right. MR angiography does not show any acute large or medium vessel occlusion. There is advanced diffuse intracranial atherosclerotic disease, most pronounced in the left anterior and middle cerebral artery territories and in the posterior circulation branch vessels Electronically Signed   By: Nelson Chimes M.D.   On: 10/03/2018 21:22   Ct Head Code Stroke Wo Contrast  Result Date:  10/03/2018 CLINICAL DATA:  Code stroke. Initial evaluation for acute facial droop. EXAM: CT HEAD WITHOUT CONTRAST TECHNIQUE: Contiguous axial images were obtained from the base of the skull through the vertex without intravenous contrast. COMPARISON:  Previous MRI from 03/30/2017 FINDINGS: Brain: Generalized age-related cerebral volume loss with moderate chronic microvascular ischemic disease. Encephalomalacia within the bilateral occipital lobes compatible with remote ischemic infarcts. Additional small remote left basal ganglia lacunar infarct, with additional tiny remote left cerebellar infarct. No acute intracranial hemorrhage. No acute large vessel territory infarct. No mass lesion, midline shift or mass effect. No hydrocephalus. No extra-axial fluid collection. Vascular: No hyperdense vessel. Scattered vascular calcifications noted within the carotid siphons. Skull: Scalp soft tissues and calvarium within normal limits. Sinuses/Orbits: Globes and orbital soft tissues normal. Mild-to-moderate circumferential mucosal thickening within the right sphenoid sinus. Paranasal sinuses are otherwise clear. Small right mastoid effusion noted. Other: None. ASPECTS [facility] Stroke Program Early CT Score) - Ganglionic level infarction (caudate, lentiform nuclei, internal capsule, insula, M1-M3 cortex): 7 - Supraganglionic infarction (M4-M6 cortex): 3 Total score (0-10 with 10 being normal): 10 IMPRESSION: 1. No acute intracranial infarct or other abnormality identified. 2. ASPECTS is 10. 3. Multiple chronic ischemic infarcts involving the left basal angling the and bilateral occipital lobes. 4. Underlying age-related cerebral atrophy with moderate chronic small vessel ischemic disease. These results were communicated to Dr. Leonel Ramsay at 8:42 pmon 5/28/2020by text page via the [facility] messaging system. Electronically Signed   By: Jeannine Boga M.D.   On: 10/03/2018 20:44    Procedures Procedures (including  critical care time)  Medications Ordered in ED Medications - No data to display   Initial Impression / Assessment and Plan / ED Course  I have reviewed the triage vital signs  and the nursing notes.  Pertinent labs & imaging results that were available during my care of the patient were reviewed by me and considered in my medical decision making (see chart for details).        Patient with acute onset of neurologic symptoms.  Stroke on MRI.  Not a TPA candidate due to time of onset.  Will admit to hospitalist.  Seen by neurology in the ER.  Final Clinical Impressions(s) / ED Diagnoses   Final diagnoses:  Acute ischemic stroke [facility])    ED Discharge Orders    None       Davonna Belling, MD 10/03/18 [id number]    Davonna Belling, MD 10/03/18 2159

## 2018-10-03 NOTE — ED Notes (Signed)
ED TO INPATIENT HANDOFF REPORT  ED Nurse Name and Phone #: Morrie Sheldon [phone]  G Name/Age/Gender Casey Harrell 84 y.o. female Room/Bed: 034C/034C  Code Status   Code Status: Prior  Home/SNF/Other Home Patient oriented to: self, place, time and situation Is this baseline? Yes   Triage Complete: Triage complete  Chief Complaint code stroke  Triage Note [facility]- pt coming from home with complaint of right side facial droop, onset around 1500. Hx of stroke. Pt has reportedly also been dealing with vertigo. No distress noted on arrival. VSS with EMS.    Allergies Allergies  Allergen Reactions  . Doxycycline Hyclate Nausea And Vomiting  . Morphine Sulfate Other (See Comments)    REACTION: hallucinations  . Iohexol Hives     Code: HIVES, Desc: pt. states she breaks out in hives 06/15/08   . Moxifloxacin Other (See Comments)    REACTION: unknown    Level of Care/Admitting Diagnosis ED Disposition    ED Disposition Condition Comment   Admit  Hospital Area: [facility] [[id number]]  Level of Care: Telemetry Medical [104]  Covid Evaluation: Screening Protocol (No Symptoms)  Diagnosis: Acute ischemic stroke [facility]) [[id number]]  Admitting Physician: Charlsie Quest [[id number]]  Attending Physician: Charlsie Quest [[id number]]  Estimated length of stay: past midnight tomorrow  Certification:: I certify this patient will need inpatient services for at least 2 midnights  PT Class (Do Not Modify): Inpatient [101]  PT Acc Code (Do Not Modify): Private [1]       B Medical/Surgery History Past Medical History:  Diagnosis Date  . ASTHMA 07/15/2008  . COPD (chronic obstructive pulmonary disease) (HCC)   . DIABETES-TYPE 2 07/15/2008  . DIABETIC PERIPHERAL NEUROPATHY 09/02/2009  . GERD 07/15/2008  . HYPERTENSION 07/15/2008  . Normal cardiac stress test    low risk Nuc 2009, Feb 2011  . Paroxysmal atrial fibrillation (HCC)   . PVD (peripheral vascular disease) (HCC) 06/18/09    RCE  . Stroke (HCC)   . Vertigo    Past Surgical History:  Procedure Laterality Date  . ABDOMINAL HYSTERECTOMY  1971  . APPENDECTOMY  1971  . CAROTID ENDARTERECTOMY  06/18/09   RCE  . CESAREAN SECTION    . colectomy and colostomy    . FLEXIBLE SIGMOIDOSCOPY     diverticulitis  . FOOT SURGERY     right foot X 2  . LAPAROTOMY     X 3 with adhesions lysis  . TONSILLECTOMY    . TUBAL LIGATION       A IV Location/Drains/Wounds Patient Lines/Drains/Airways Status   Active Line/Drains/Airways    None          Intake/Output Last 24 hours No intake or output data in the 24 hours ending 10/03/18 2202  Labs/Imaging Results for orders placed or performed during the hospital encounter of 10/03/18 (from the past 48 hour(s))  Ethanol     Status: None   Collection Time: 10/03/18  8:17 PM  Result Value Ref Range   Alcohol, Ethyl (B) <10 <10 mg/dL    Comment: (NOTE) Lowest detectable limit for serum alcohol is 10 mg/dL. For medical purposes only. Performed at [facility], [address], [place], [place] [address]   Protime-INR     Status: Abnormal   Collection Time: 10/03/18  8:17 PM  Result Value Ref Range   Prothrombin Time 16.9 (H) 11.4 - 15.2 seconds   INR 1.4 (H) 0.8 - 1.2    Comment: (NOTE)  INR goal varies based on device and disease states. Performed at [facility], [address] [place], [place] [address]   APTT     Status: None   Collection Time: 10/03/18  8:17 PM  Result Value Ref Range   aPTT 33 24 - 36 seconds    Comment: Performed at [facility], [address], [place], [place] [address]  CBC     Status: Abnormal   Collection Time: 10/03/18  8:17 PM  Result Value Ref Range   WBC 10.6 (H) 4.0 - 10.5 K/uL   RBC 4.40 3.87 - 5.11 MIL/uL   Hemoglobin 13.1 12.0 - 15.0 g/dL   HCT 29.5 62.1 - 30.8 %   MCV 90.7 80.0 - 100.0 fL   MCH 29.8 26.0 - 34.0 pg   MCHC 32.8 30.0 - 36.0 g/dL   RDW 65.7 84.6 - 96.2 %   Platelets 267 150 - 400  K/uL   nRBC 0.0 0.0 - 0.2 %    Comment: Performed at [facility], [address], [place], [place] [address]  Differential     Status: Abnormal   Collection Time: 10/03/18  8:17 PM  Result Value Ref Range   Neutrophils Relative % 47 %   Neutro Abs 4.9 1.7 - 7.7 K/uL   Lymphocytes Relative 38 %   Lymphs Abs 4.1 (H) 0.7 - 4.0 K/uL   Monocytes Relative 10 %   Monocytes Absolute 1.1 (H) 0.1 - 1.0 K/uL   Eosinophils Relative 5 %   Eosinophils Absolute 0.5 0.0 - 0.5 K/uL   Basophils Relative 0 %   Basophils Absolute 0.0 0.0 - 0.1 K/uL   Immature Granulocytes 0 %   Abs Immature Granulocytes 0.04 0.00 - 0.07 K/uL    Comment: Performed at [facility], [address] [place], [place] [address]  Comprehensive metabolic panel     Status: Abnormal   Collection Time: 10/03/18  8:17 PM  Result Value Ref Range   Sodium 136 135 - 145 mmol/L   Potassium 3.5 3.5 - 5.1 mmol/L   Chloride 104 98 - 111 mmol/L   CO2 22 22 - 32 mmol/L   Glucose, Bld 133 (H) 70 - 99 mg/dL   BUN 20 8 - 23 mg/dL   Creatinine, Ser 0.10 (H) 0.44 - 1.00 mg/dL   Calcium 9.6 8.9 - 27.2 mg/dL   Total Protein 7.0 6.5 - 8.1 g/dL   Albumin 3.7 3.5 - 5.0 g/dL   AST 15 15 - 41 U/L   ALT 12 0 - 44 U/L   Alkaline Phosphatase 96 38 - 126 U/L   Total Bilirubin 0.5 0.3 - 1.2 mg/dL   GFR calc non Af Amer 50 (L) >60 mL/min   GFR calc Af Amer 58 (L) >60 mL/min   Anion gap 10 5 - 15    Comment: Performed at [facility], [address] [place], [place] [address]  I-stat chem 8, ED [facility] and [facility] only)     Status: Abnormal   Collection Time: 10/03/18  8:21 PM  Result Value Ref Range   Sodium 137 135 - 145 mmol/L   Potassium 3.5 3.5 - 5.1 mmol/L   Chloride 103 98 - 111 mmol/L   BUN 22 8 - 23 mg/dL   Creatinine, Ser 4.03 0.44 - 1.00 mg/dL   Glucose, Bld 474 (H) 70 - 99 mg/dL   Calcium, Ion 2.59 5.63 - 1.40 mmol/L   TCO2  23 22 - 32 mmol/L   Hemoglobin 13.9 12.0 - 15.0 g/dL   HCT 16.1 09.6 - 04.5 %   Mr [facility] Contrast  Result Date: 10/03/2018 CLINICAL DATA:  Code stroke.  Acute facial droop. EXAM: MRI HEAD WITHOUT CONTRAST MRA HEAD WITHOUT CONTRAST TECHNIQUE: Multiplanar, multiecho pulse sequences of the brain and surrounding structures were obtained without intravenous contrast. Angiographic images of the head were obtained using MRA technique without contrast. COMPARISON:  Head CT same day.  MRI 03/30/2017 FINDINGS: MRI HEAD FINDINGS Brain: Acute 5 mm infarction in the right paramedian dorsal lower pons. No other acute insult. Elsewhere, there are chronic small-vessel ischemic changes affecting the pons, with a few small foci of hemosiderin deposition. There are old small vessel infarctions within the cerebellum, some with hemosiderin deposition. There is extensive chronic small vessel ischemic change throughout the cerebral hemispheric white matter. There are old occipital cortical and subcortical infarctions, more extensive on the left than the right. There are old lacunar infarctions affecting the thalami and basal ganglia. No mass lesion. No hydrocephalus or extra-axial collection. Vascular: There is atherosclerotic calcification of the major vessels at the base of the brain. Skull and upper cervical spine: Negative Sinuses/Orbits: Clear/normal Other: None MRA HEAD FINDINGS Both internal carotid arteries are patent through the skull base and siphon regions. The anterior and middle cerebral vessels are patent. There are multiple intracranial stenoses and areas of irregularity, more extensive in the left anterior and middle cerebral vessels than right. Both vertebral arteries are patent to the basilar. 50% stenosis of the V4 segment on the left. Large right PICA. Basilar artery shows mild atherosclerotic irregularity but no flow limiting stenosis. There is flow within the superior cerebellar and posterior cerebral arteries, but the vessels show pronounced atherosclerotic irregularity and narrowing. IMPRESSION:  Acute punctate 5 mm infarction in the right para Harrell dorsal level or pons. Extensive small vessel ischemic changes elsewhere throughout the brainstem, cerebellum, thalami, basal ganglia and hemispheric white matter, some with old hemosiderin deposition. Old cortical infarctions in the occipital lobes, more extensive on the left than the right. MR angiography does not show any acute large or medium vessel occlusion. There is advanced diffuse intracranial atherosclerotic disease, most pronounced in the left anterior and middle cerebral artery territories and in the posterior circulation branch vessels Electronically Signed   By: Paulina Fusi M.D.   On: 10/03/2018 21:22   Mr Brain Wo Contrast  Result Date: 10/03/2018 CLINICAL DATA:  Code stroke.  Acute facial droop. EXAM: MRI HEAD WITHOUT CONTRAST MRA HEAD WITHOUT CONTRAST TECHNIQUE: Multiplanar, multiecho pulse sequences of the brain and surrounding structures were obtained without intravenous contrast. Angiographic images of the head were obtained using MRA technique without contrast. COMPARISON:  Head CT same day.  MRI 03/30/2017 FINDINGS: MRI HEAD FINDINGS Brain: Acute 5 mm infarction in the right paramedian dorsal lower pons. No other acute insult. Elsewhere, there are chronic small-vessel ischemic changes affecting the pons, with a few small foci of hemosiderin deposition. There are old small vessel infarctions within the cerebellum, some with hemosiderin deposition. There is extensive chronic small vessel ischemic change throughout the cerebral hemispheric white matter. There are old occipital cortical and subcortical infarctions, more extensive on the left than the right. There are old lacunar infarctions affecting the thalami and basal ganglia. No mass lesion. No hydrocephalus or extra-axial collection. Vascular: There is atherosclerotic calcification of the major vessels at the base of the brain. Skull and upper cervical spine: Negative Sinuses/Orbits:  Clear/normal Other: None MRA HEAD FINDINGS Both internal carotid arteries are patent through the skull base and siphon regions. The anterior and middle cerebral vessels are patent. There are multiple intracranial stenoses and areas of irregularity, more extensive in the left anterior and middle cerebral vessels than right. Both vertebral arteries are patent to the basilar. 50% stenosis of the V4 segment on the left. Large right PICA. Basilar artery shows mild atherosclerotic irregularity but no flow limiting stenosis. There is flow within the superior cerebellar and posterior cerebral arteries, but the vessels show pronounced atherosclerotic irregularity and narrowing. IMPRESSION: Acute punctate 5 mm infarction in the right para Harrell dorsal level or pons. Extensive small vessel ischemic changes elsewhere throughout the brainstem, cerebellum, thalami, basal ganglia and hemispheric white matter, some with old hemosiderin deposition. Old cortical infarctions in the occipital lobes, more extensive on the left than the right. MR angiography does not show any acute large or medium vessel occlusion. There is advanced diffuse intracranial atherosclerotic disease, most pronounced in the left anterior and middle cerebral artery territories and in the posterior circulation branch vessels Electronically Signed   By: Paulina Fusi M.D.   On: 10/03/2018 21:22   Ct Head Code Stroke Wo Contrast  Result Date: 10/03/2018 CLINICAL DATA:  Code stroke. Initial evaluation for acute facial droop. EXAM: CT HEAD WITHOUT CONTRAST TECHNIQUE: Contiguous axial images were obtained from the base of the skull through the vertex without intravenous contrast. COMPARISON:  Previous MRI from 03/30/2017 FINDINGS: Brain: Generalized age-related cerebral volume loss with moderate chronic microvascular ischemic disease. Encephalomalacia within the bilateral occipital lobes compatible with remote ischemic infarcts. Additional small remote left basal  ganglia lacunar infarct, with additional tiny remote left cerebellar infarct. No acute intracranial hemorrhage. No acute large vessel territory infarct. No mass lesion, midline shift or mass effect. No hydrocephalus. No extra-axial fluid collection. Vascular: No hyperdense vessel. Scattered vascular calcifications noted within the carotid siphons. Skull: Scalp soft tissues and calvarium within normal limits. Sinuses/Orbits: Globes and orbital soft tissues normal. Mild-to-moderate circumferential mucosal thickening within the right sphenoid sinus. Paranasal sinuses are otherwise clear. Small right mastoid effusion noted. Other: None. ASPECTS [facility] Stroke Program Early CT Score) - Ganglionic level infarction (caudate, lentiform nuclei, internal capsule, insula, M1-M3 cortex): 7 - Supraganglionic infarction (M4-M6 cortex): 3 Total score (0-10 with 10 being normal): 10 IMPRESSION: 1. No acute intracranial infarct or other abnormality identified. 2. ASPECTS is 10. 3. Multiple chronic ischemic infarcts involving the left basal angling the and bilateral occipital lobes. 4. Underlying age-related cerebral atrophy with moderate chronic small vessel ischemic disease. These results were communicated to Dr. Amada Jupiter at 8:42 pmon 5/28/2020by text page via the [facility] messaging system. Electronically Signed   By: Rise Mu M.D.   On: 10/03/2018 20:44    Pending Labs Unresulted Labs (From admission, onward)    Start     Ordered   10/03/18 2141  SARS Coronavirus 2 (CEPHEID - Performed in [facility] hospital lab), Hosp Order  (Asymptomatic Patients Labs)  Once,   R    Question:  Rule Out  Answer:  Yes   10/03/18 2141   10/03/18 2017  Urine rapid drug screen (hosp performed)  ONCE - STAT,   STAT     10/03/18 2016   10/03/18 2017  Urinalysis, Routine w reflex microscopic  ONCE - STAT,   STAT     10/03/18 2016          Vitals/Pain Today's Vitals   10/03/18 2130 10/03/18 2132  10/03/18 2133 10/03/18  2153  BP: (!) 121/54   (!) 157/67  Pulse: (!) 54   (!) 51  Resp: 11   18  Temp:      SpO2: 94%   95%  Weight:  61.7 kg 56.7 kg   Height:   5\' 1"  (1.549 m)   PainSc:        Isolation Precautions No active isolations  Medications Medications - No data to display  Mobility walks with device Moderate fall risk   Focused Assessments Neuro Assessment Handoff:  Swallow screen pass? Yes    NIH Stroke Scale ( + Modified Stroke Scale Criteria)  Interval: Initial Level of Consciousness (1a.)   : Alert, keenly responsive LOC Questions (1b. )   +: Answers one question correctly LOC Commands (1c. )   + : Performs both tasks correctly Best Gaze (2. )  +: Partial gaze palsy Visual (3. )  +: No visual loss Facial Palsy (4. )    : Partial paralysis  Motor Arm, Left (5a. )   +: No drift Motor Arm, Right (5b. )   +: Drift Motor Leg, Left (6a. )   +: No drift Motor Leg, Right (6b. )   +: Drift Limb Ataxia (7. ): Present in one limb Sensory (8. )   +: Mild-to-moderate sensory loss, patient feels pinprick is less sharp or is dull on the affected side, or there is a loss of superficial pain with pinprick, but patient is aware of being touched Best Language (9. )   +: No aphasia Dysarthria (10. ): Normal Extinction/Inattention (11.)   +: No Abnormality Modified SS Total  +: 5 Complete NIHSS TOTAL: 8 Last date known well: 10/03/18 Last time known well: 1500 Neuro Assessment:   Neuro Checks:   Initial (10/03/18 2035)  Last Documented NIHSS Modified Score: 5 (10/03/18 2045) Has TPA been given? No If patient is a Neuro Trauma and patient is going to OR before floor call report to 4N Charge nurse: [phone] or [phone]     R Recommendations: See Admitting Provider Note  Report given to:   Additional Notes:

## 2018-10-03 NOTE — Consult Note (Signed)
Neurology Consultation Reason for Consult: Stroke Referring Physician: Rubin Payor, Dorris Carnes  CC: Stroke  History is obtained from: Patient  HPI: Casey Harrell is a 84 y.o. female with a history of diabetes, hypertension, previous stroke who presents with right facial weakness and right-sided arm and leg mild weakness that started at 3 PM.  She states that she did not have any trouble walking until she tried to get into the ambulance, and the only thing she noticed was facial droop.  At the bridge, was noticed that she could not cross midline to the right and she was taken for emergent CT which was negative.  Given multiple posterior circulation findings and a question of slightly hyperdense basilar, she was brought to MR I/MRA emergently to rule out basilar occlusion (she is allergic to IV contrast).  LKW: 3 PM tpa given?: no, out of window    ROS: A 14 point ROS was performed and is negative except as noted in the HPI.   Past Medical History:  Diagnosis Date  . ASTHMA 07/15/2008  . COPD (chronic obstructive pulmonary disease) (HCC)   . DIABETES-TYPE 2 07/15/2008  . DIABETIC PERIPHERAL NEUROPATHY 09/02/2009  . GERD 07/15/2008  . HYPERTENSION 07/15/2008  . Normal cardiac stress test    low risk Nuc 2009, Feb 2011  . Paroxysmal atrial fibrillation (HCC)   . PVD (peripheral vascular disease) (HCC) 06/18/09   RCE  . Stroke (HCC)   . Vertigo      Family History  Problem Relation Age of Onset  . Diabetes Mother   . Arthritis Mother   . Heart disease Mother   . Heart disease Father   . Heart disease Sister   . Heart disease Brother   . Cancer Brother 60       lung cancer  . Celiac disease Sister   . Stroke Maternal Grandfather   . Cancer Paternal Grandfather        lung  . Heart disease Sister   . Asthma Sister      Social History:  reports that she has never smoked. She has never used smokeless tobacco. She reports that she does not drink alcohol or use drugs.   Exam: Current  vital signs: There were no vitals taken for this visit. Vital signs in last 24 hours:     Physical Exam  Constitutional: Appears well-developed and well-nourished.  Psych: Affect appropriate to situation Eyes: No scleral injection HENT: No OP obstrucion Head: Normocephalic.  Cardiovascular: Normal rate and regular rhythm.  Respiratory: Effort normal, non-labored breathing GI: Soft.  No distension. There is no tenderness.  Skin: WDI  Neuro: Mental Status: Patient is awake, alert, oriented to person, place, age, but unable to give month No signs of aphasia or neglect Cranial Nerves: II: Visual Fields are full, but she occasionally adds a finger when I am holding up 2 fingers, I suspect double vision though she denies it.. Pupils are equal, round, and reactive to light.   III,IV, VI: She does not cross midline to the right V: Facial sensation is symmetric to temperature VII: Facial movement is weak on the right including the upper portion Motor: She has mild 4+/5 weakness in the right arm and leg, normal on the left  sensory: Sensation is diminished on the left to temperature Cerebellar: She has impaired finger-nose-finger in the right arm and leg, appears consistent with weakness in the right leg, but appears out of proportion to weakness in the right arm   I have  reviewed labs in epic and the results pertinent to this consultation are: Mild leukocytosis CMP-unremarkable  I have reviewed the images obtained: CT head-negative  Impression: 84 year old female with likely small brainstem infarct. This is likely due to small vessel disease. She will need admission for therapy and secondary risk factor modification.   Recommendations: - HgbA1c, fasting lipid panel - MRI, MRA  of the brain without contrast - Frequent neuro checks - Echocardiogram - carotid dopplers - Prophylactic therapy-Antiplatelet med: Aspirin - dose 325mg  PO or 300mg  PR - Risk factor modification -  Telemetry monitoring - PT consult, OT consult, Speech consult - Stroke team to follow    Ritta SlotMcNeill Aubriee Szeto, MD [facility] [phone]  If 7pm- 7am, please page neurology on call as listed in AMION.

## 2018-10-03 NOTE — ED Notes (Signed)
Daughter in law, gwendolyn Blume- [phone]

## 2018-10-03 NOTE — Code Documentation (Addendum)
Responded to Code Stroke called at 2001 d/t facial droop. Pt arrived at 2013. LSN-1500, NIH-8, CBG-131. Ct head negative for hemorrhage. Pt out of window for TPA and has a contrast allergy. MRI ordered. Pt transported to MRI with ED RN. MRI-brainstem infarct, no LVO, . Plan to admit to medical team.

## 2018-10-04 ENCOUNTER — Inpatient Hospital Stay (HOSPITAL_COMMUNITY): Payer: Medicare Other

## 2018-10-04 DIAGNOSIS — I480 Paroxysmal atrial fibrillation: Secondary | ICD-10-CM

## 2018-10-04 DIAGNOSIS — J449 Chronic obstructive pulmonary disease, unspecified: Secondary | ICD-10-CM

## 2018-10-04 DIAGNOSIS — E039 Hypothyroidism, unspecified: Secondary | ICD-10-CM

## 2018-10-04 DIAGNOSIS — E785 Hyperlipidemia, unspecified: Secondary | ICD-10-CM

## 2018-10-04 DIAGNOSIS — I10 Essential (primary) hypertension: Secondary | ICD-10-CM

## 2018-10-04 DIAGNOSIS — G4730 Sleep apnea, unspecified: Secondary | ICD-10-CM

## 2018-10-04 DIAGNOSIS — E1165 Type 2 diabetes mellitus with hyperglycemia: Secondary | ICD-10-CM

## 2018-10-04 DIAGNOSIS — I639 Cerebral infarction, unspecified: Secondary | ICD-10-CM

## 2018-10-04 DIAGNOSIS — I351 Nonrheumatic aortic (valve) insufficiency: Secondary | ICD-10-CM

## 2018-10-04 LAB — CBC
HCT: 39 % (ref 36.0–46.0)
Hemoglobin: 12.9 g/dL (ref 12.0–15.0)
MCH: 29.3 pg (ref 26.0–34.0)
MCHC: 33.1 g/dL (ref 30.0–36.0)
MCV: 88.4 fL (ref 80.0–100.0)
Platelets: 259 10*3/uL (ref 150–400)
RBC: 4.41 MIL/uL (ref 3.87–5.11)
RDW: 13.3 % (ref 11.5–15.5)
WBC: 10.5 10*3/uL (ref 4.0–10.5)
nRBC: 0 % (ref 0.0–0.2)

## 2018-10-04 LAB — BASIC METABOLIC PANEL
Anion gap: 13 (ref 5–15)
BUN: 17 mg/dL (ref 8–23)
CO2: 24 mmol/L (ref 22–32)
Calcium: 9.6 mg/dL (ref 8.9–10.3)
Chloride: 103 mmol/L (ref 98–111)
Creatinine, Ser: 0.88 mg/dL (ref 0.44–1.00)
GFR calc Af Amer: >60 mL/min (ref >60)
GFR calc non Af Amer: >60 mL/min (ref >60)
Glucose, Bld: 97 mg/dL (ref 70–99)
Potassium: 3.4 mmol/L — ABNORMAL LOW (ref 3.5–5.1)
Sodium: 140 mmol/L (ref 135–145)

## 2018-10-04 LAB — LIPID PANEL
Cholesterol: 101 mg/dL (ref 0–200)
HDL: 24 mg/dL — ABNORMAL LOW (ref >40)
LDL Cholesterol: 57 mg/dL (ref 0–99)
Total CHOL/HDL Ratio: 4.2 RATIO
Triglycerides: 99 mg/dL (ref <150)
VLDL: 20 mg/dL (ref 0–40)

## 2018-10-04 LAB — GLUCOSE, CAPILLARY
Glucose-Capillary: 148 mg/dL — ABNORMAL HIGH (ref 70–99)
Glucose-Capillary: 99 mg/dL (ref 70–99)
Glucose-Capillary: 166 mg/dL — ABNORMAL HIGH (ref 70–99)
Glucose-Capillary: 283 mg/dL — ABNORMAL HIGH (ref 70–99)
Glucose-Capillary: 342 mg/dL — ABNORMAL HIGH (ref 70–99)

## 2018-10-04 LAB — HEMOGLOBIN A1C
Hgb A1c MFr Bld: 9.6 % — ABNORMAL HIGH (ref 4.8–5.6)
Mean Plasma Glucose: 228.82 mg/dL

## 2018-10-04 LAB — ECHOCARDIOGRAM COMPLETE BUBBLE STUDY
Height: 61 in
Weight: 2000 oz

## 2018-10-04 MED ORDER — DEXTROSE 50 % IV SOLN
12.5000 g | INTRAVENOUS | Status: AC
  Administered 2018-10-04: 01:00:00 12.5 g via INTRAVENOUS
  Filled 2018-10-04: qty 50

## 2018-10-04 MED ORDER — POTASSIUM CHLORIDE CRYS ER 20 MEQ PO TBCR
40.0000 meq | EXTENDED_RELEASE_TABLET | Freq: Once | ORAL | Status: AC
  Administered 2018-10-04: 15:00:00 40 meq via ORAL
  Filled 2018-10-04: qty 2

## 2018-10-04 MED ORDER — INSULIN ASPART 100 UNIT/ML ~~LOC~~ SOLN
5.0000 [IU] | Freq: Once | SUBCUTANEOUS | Status: AC
  Administered 2018-10-04: 23:00:00 5 [IU] via SUBCUTANEOUS

## 2018-10-04 MED ORDER — ATORVASTATIN CALCIUM 10 MG PO TABS
20.0000 mg | ORAL_TABLET | Freq: Every day | ORAL | Status: DC
  Administered 2018-10-05 – 2018-10-08 (×4): 20 mg via ORAL
  Filled 2018-10-04 (×4): qty 2

## 2018-10-04 NOTE — Progress Notes (Signed)
Occupational Therapy Evaluation Patient Details Name: Casey Harrell MRN: [id number] DOB: 10-23-1934 Today's Date: 10/04/2018    History of Present Illness Casey Harrell is a 84 y.o. female with medical history significant for PAF on Eliquis, Hx of CVA, IDT2DM, HTN, HLD, COPD, hypothyroidism, peripheral neuropathy, depression, and OSA who presents to the ED for evaluation of right facial droop. MRI brain and MRA head without contrast were obtained and showed an acute punctate 5 mm infarction in the right paramedian dorsal level of pons with extensive small vessel ischemic changes and old cortical infarcts in the occipital lobes, more extensive on the left than the right.    Clinical Impression   PTA, pt was living at home with her daughter in-law and son, pt reports she ws independent with ADL and received assistance from family IADL. Pt reports she was independent with functional mobility. Pt reports family is available from 1:30pm until they leave for work the next morning. Pt currently requires minguard to minA for ADL and functional mobility at RW level. Pt demonstrates decreased safety awareness and minor instability impacting her ability to safely and independently complete ADL/IADL and functional mobility. Due to decline in current level of function, pt would benefit from acute OT to address established goals to facilitate safe D/C to venue listed below. At this time, recommend CIR follow-up. If pt is not approved for CIR, recommend HHOT with 24/hr supervision/physical assistance. Will continue to follow acutely.     Follow Up Recommendations  CIR;Supervision/Assistance - 24 hour;Home health OT    Equipment Recommendations  3 in 1 bedside commode    Recommendations for Other Services PT consult     Precautions / Restrictions Precautions Precautions: Fall Restrictions Weight Bearing Restrictions: No      Mobility Bed Mobility Overal bed mobility: Needs Assistance Bed Mobility: Supine  to Sit     Supine to sit: Min assist;HOB elevated     General bed mobility comments: minA to progress posture to upright position   Transfers Overall transfer level: Needs assistance Equipment used: Rolling walker (2 wheeled) Transfers: Sit to/from Stand Sit to Stand: Min assist         General transfer comment: minA for stability in standing and when reaching outside base of support    Balance Overall balance assessment: Needs assistance Sitting-balance support: Single extremity supported;Feet supported Sitting balance-Leahy Scale: Fair Sitting balance - Comments: pt minor instability after adjusting socks;able to self correct   Standing balance support: Single extremity supported;During functional activity Standing balance-Leahy Scale: Fair Standing balance comment: required support of SUE during grooming task while standing at sink level                           ADL either performed or assessed with clinical judgement   ADL Overall ADL's : Needs assistance/impaired Eating/Feeding: Set up;Sitting   Grooming: Min guard;Standing Grooming Details (indicate cue type and reason): pt completed grooming while standing at sink level for 8 min Upper Body Bathing: Min guard;Minimal assistance Upper Body Bathing Details (indicate cue type and reason): pt faituges quickly minA after duration of time Lower Body Bathing: Min guard;Minimal assistance   Upper Body Dressing : Min guard;Sitting   Lower Body Dressing: Min guard;Sit to/from stand;Minimal assistance Lower Body Dressing Details (indicate cue type and reason): minA for stability in standing  Toilet Transfer: Minimal assistance;Ambulation;RW Toilet Transfer Details (indicate cue type and reason): minA for mobility and safe use of DME Toileting-  Clothing Manipulation and Hygiene: Minimal assistance;Sit to/from stand       Functional mobility during ADLs: Minimal assistance;Rolling walker;Cueing for  safety General ADL Comments: minA for more dynamic level activities;pt requires support of sink and RW with ADL and functioanl mobility      Vision Baseline Vision/History: No visual deficits Patient Visual Report: Blurring of vision Vision Assessment?: Yes Eye Alignment: Within Functional Limits Ocular Range of Motion: Restricted looking up;Restricted on the right;Restricted on the left Alignment/Gaze Preference: Within Defined Limits Tracking/Visual Pursuits: Right eye does not track laterally;Decreased smoothness of horizontal tracking;Decreased smoothness of vertical tracking;Decreased smoothness of eye movement to RIGHT superior field;Requires cues, head turns, or add eye shifts to track;Unable to hold eye position out of midline Saccades: Additional eye shifts occurred during testing;Decreased speed of saccadic movement;Undershoots Visual Fields: No apparent deficits Diplopia Assessment: Other (comment)(none stated) Depth Perception: Undershoots Additional Comments: pt reports blurry vision in R eye, covering eye with hand;pt sensitive to bright light in R eye;R eye does not track laterally, pt states she is able to see the door on her right but unsure how clear image is;pt requires head turn to clearly see upclose objects in lateral R field      Perception Perception Perception Tested?: No   Praxis      Pertinent Vitals/Pain Pain Assessment: No/denies pain     Hand Dominance Right   Extremity/Trunk Assessment Upper Extremity Assessment Upper Extremity Assessment: Generalized weakness   Lower Extremity Assessment Lower Extremity Assessment: Defer to PT evaluation   Cervical / Trunk Assessment Cervical / Trunk Assessment: Normal   Communication Communication Communication: No difficulties   Cognition Arousal/Alertness: Awake/alert Behavior During Therapy: Flat affect Overall Cognitive Status: No family/caregiver present to determine baseline cognitive functioning                                  General Comments: pt alert and oriented to place, time, person, and situation;provided PLOF and home living;demonstrated good safety awareness;good initiation and termination of task;heeded grooming task and cleaned areas after termination;   General Comments  VSS throughout;educated pt on BE FAST accronym written in stroke booklet    Exercises     Shoulder Instructions      Home Living Family/patient expects to be discharged to:: Private residence Living Arrangements: Children Available Help at Discharge: Available PRN/intermittently(daughter works in the mornings;occassionaly alone ) Type of Home: House Home Access: Stairs to enter Secretary/administratorntrance Stairs-Number of Steps: 6 Entrance Stairs-Rails: Can reach both Home Layout: One level     Bathroom Shower/Tub: Walk-in shower(little step in )   FirefighterBathroom Toilet: Standard Bathroom Accessibility: Yes How Accessible: Accessible via walker Home Equipment: Walker - 2 wheels;Shower seat - built in;Cane - single point   Additional Comments: pt's family available from 1:30 through the night, until morning when they leave for work      Prior Functioning/Environment Level of Independence: Needs assistance  Gait / Transfers Assistance Needed: pt was ambulating without AD; ADL's / Homemaking Assistance Needed: family members assist with IADL; pt reports she was independent with ADL            OT Problem List: Decreased strength;Decreased activity tolerance;Impaired balance (sitting and/or standing);Impaired vision/perception;Decreased cognition;Decreased safety awareness;Decreased knowledge of use of DME or AE      OT Treatment/Interventions: Self-care/ADL training;Therapeutic exercise;Energy conservation;DME and/or AE instruction;Therapeutic activities;Cognitive remediation/compensation;Visual/perceptual remediation/compensation;Patient/family education;Balance training    OT Goals(Current goals can  be  found in the care plan section) Acute Rehab OT Goals Patient Stated Goal: to get back to baseline OT Goal Formulation: With patient Time For Goal Achievement: 10/18/18 Potential to Achieve Goals: Good ADL Goals Pt Will Perform Grooming: with modified independence;standing;sitting Pt Will Perform Lower Body Bathing: with modified independence;sit to/from stand Pt Will Perform Lower Body Dressing: with modified independence;sit to/from stand Pt Will Transfer to Toilet: with modified independence;ambulating Additional ADL Goal #1: Pt will independtly verbalize BE FAST meaning.  OT Frequency: Min 2X/week   Barriers to D/C: Decreased caregiver support  pt does not have appropriate level of support        Co-evaluation              AM-PAC OT "6 Clicks" Daily Activity     Outcome Measure Help from another person eating meals?: None Help from another person taking care of personal grooming?: A Little Help from another person toileting, which includes using toliet, bedpan, or urinal?: A Little Help from another person bathing (including washing, rinsing, drying)?: A Little Help from another person to put on and taking off regular upper body clothing?: A Little Help from another person to put on and taking off regular lower body clothing?: A Little 6 Click Score: 19   End of Session Equipment Utilized During Treatment: Gait belt;Rolling walker Nurse Communication: Mobility status  Activity Tolerance: Patient tolerated treatment well Patient left: in chair;with call bell/phone within reach;with chair alarm set;Other (comment)(with SLP )  OT Visit Diagnosis: Unsteadiness on feet (R26.81);Other abnormalities of gait and mobility (R26.89);Muscle weakness (generalized) (M62.81);Low vision, both eyes (H54.2);Other symptoms and signs involving cognitive function                Time: 1016-1056 OT Time Calculation (min): 40 min Charges:  OT General Charges $OT Visit: 1 Visit OT  Evaluation $OT Eval Moderate Complexity: 1 Mod OT Treatments $Self Care/Home Management : 23-37 mins  Casey Harrell OTR/L Acute Rehabilitation Services Office: [phone]   Rebeca Alert 10/04/2018, 11:20 AM

## 2018-10-04 NOTE — Progress Notes (Signed)
Rehab Admissions Coordinator Note:  Patient was screened by Clois Dupes for appropriateness for an Inpatient Acute Rehab Consult per PT and OT recs.  At this time, we are recommending Inpatient Rehab consult if pt would like to be considered for admit. Previous admit 2018. Please advise.  Clois Dupes RN MSN 10/04/2018, 2:01 PM  I can be reached at [phone].

## 2018-10-04 NOTE — Discharge Instructions (Signed)

## 2018-10-04 NOTE — Progress Notes (Signed)
10/04/18 1325  PT Visit Information  Last PT Received On 10/04/18  Assistance Needed +1  History of Present Illness Casey Harrell is a 84 y.o. female with medical history significant for PAF on Eliquis, Hx of CVA, IDT2DM, HTN, HLD, COPD, hypothyroidism, peripheral neuropathy, depression, and OSA who presents to the ED for evaluation of right facial droop. MRI brain and MRA head without contrast were obtained and showed an acute punctate 5 mm infarction in the right paramedian dorsal level of pons with extensive small vessel ischemic changes and old cortical infarcts in the occipital lobes, more extensive on the left than the right.   Precautions  Precautions Fall  Restrictions  Weight Bearing Restrictions No  Home Living  Family/patient expects to be discharged to: Private residence  Living Arrangements Children  Available Help at Discharge Available PRN/intermittently  Type of Home House  Home Access Stairs to enter  Entrance Stairs-Number of Steps 7  Entrance Stairs-Rails Can reach both  Home Layout One level  Bathroom Shower/Tub Walk-in IT trainershower  Bathroom Toilet Standard  Home Equipment Walker - 2 wheels;Shower seat - built in;Cane - single point  Additional Comments pt's family available from 1:30 through the night, until morning when they leave for work  Prior Function  Level of Independence Needs assistance  Gait / Transfers Assistance Needed pt was ambulating without AD;  ADL's / Homemaking Assistance Needed family members assist with IADL; pt reports she was independent with ADL  Communication  Communication No difficulties  Pain Assessment  Pain Assessment No/denies pain  Cognition  Arousal/Alertness Awake/alert  Behavior During Therapy WFL for tasks assessed/performed  Overall Cognitive Status No family/caregiver present to determine baseline cognitive functioning  Upper Extremity Assessment  Upper Extremity Assessment Defer to OT evaluation  Lower Extremity Assessment   Lower Extremity Assessment Generalized weakness  Cervical / Trunk Assessment  Cervical / Trunk Assessment Normal  Bed Mobility  General bed mobility comments Sitting in chair upon entry.   Transfers  Overall transfer level Needs assistance  Equipment used 1 person hand held assist  Transfers Sit to/from Stand  Sit to Stand Min assist  General transfer comment Min A for steadying assist to stand.   Ambulation/Gait  Ambulation/Gait assistance Min assist;Mod assist  Gait Distance (Feet) 30 Feet  Assistive device 1 person hand held assist  Gait Pattern/deviations Step-through pattern;Decreased stride length;Shuffle  General Gait Details Slow, unsteady gait. Pt with decreased step height in RLE and tended to drag RLE during gait. Required min to mod A for steadying.   Gait velocity Decreased   Modified Rankin (Stroke Patients Only)  Pre-Morbid Rankin Score 1  Modified Rankin 4  Balance  Overall balance assessment Needs assistance  Sitting-balance support No upper extremity supported;Feet supported  Sitting balance-Leahy Scale Fair  Standing balance support Single extremity supported;During functional activity  Standing balance-Leahy Scale Poor  Standing balance comment Reliant on UE and external support during mobility tasks.   General Comments  General comments (skin integrity, edema, etc.) Re-educated about "BE FAST" acronym. Pt with facial droop on the R side of face with blurry vision in the R eye.   PT - End of Session  Equipment Utilized During Treatment Gait belt  Activity Tolerance Patient tolerated treatment well  Patient left in chair;with call bell/phone within reach;with chair alarm set  Nurse Communication Mobility status  PT Assessment  PT Recommendation/Assessment Patient needs continued PT services  PT Visit Diagnosis Unsteadiness on feet (R26.81);Muscle weakness (generalized) (M62.81)  PT Problem  List Decreased strength;Decreased balance;Decreased  mobility;Decreased coordination;Decreased knowledge of use of DME;Decreased knowledge of precautions  PT Plan  PT Frequency (ACUTE ONLY) Min 4X/week  PT Treatment/Interventions (ACUTE ONLY) DME instruction;Gait training;Functional mobility training;Stair training;Therapeutic activities;Therapeutic exercise;Balance training;Patient/family education  AM-PAC PT "6 Clicks" Mobility Outcome Measure (Version 2)  Help needed turning from your back to your side while in a flat bed without using bedrails? 3  Help needed moving from lying on your back to sitting on the side of a flat bed without using bedrails? 3  Help needed moving to and from a bed to a chair (including a wheelchair)? 2  Help needed standing up from a chair using your arms (e.g., wheelchair or bedside chair)? 3  Help needed to walk in hospital room? 2  Help needed climbing 3-5 steps with a railing?  2  6 Click Score 15  Consider Recommendation of Discharge To: CIR/SNF/LTACH  PT Recommendation  Recommendations for Other Services Rehab consult  Follow Up Recommendations CIR;Supervision/Assistance - 24 hour  PT equipment None recommended by PT  Individuals Consulted  Consulted and Agree with Results and Recommendations Patient  Acute Rehab PT Goals  Patient Stated Goal to get back to baseline  PT Goal Formulation With patient  Time For Goal Achievement 10/18/18  Potential to Achieve Goals Good  PT Time Calculation  PT Start Time (ACUTE ONLY) 1128  PT Stop Time (ACUTE ONLY) 1148  PT Time Calculation (min) (ACUTE ONLY) 20 min  PT General Charges  $$ ACUTE PT VISIT 1 Visit  PT Evaluation  $PT Eval Moderate Complexity 1 Mod  Written Expression  Dominant Hand Right   Pt with problem above with deficits above. Pt presenting with poor balance and blurry vision in R eye. Pt requiring min to mod A for steadying during gait without AD. Pt was previously independent prior to coming into hospital and feel she would benefit from CIR level  therapies to increase independence and safety with mobility prior to return home. Will continue to follow acutely to maximize functional mobility independence and safety.   Gladys Damme, PT, DPT  Acute Rehabilitation Services  Pager: [phone] Office: [phone]

## 2018-10-04 NOTE — Progress Notes (Signed)
  Echocardiogram 2D Echocardiogram has been performed.  Casey Harrell 10/04/2018, 12:22 PM

## 2018-10-04 NOTE — Progress Notes (Signed)
PROGRESS NOTE    Casey Harrell  UUV:[id number] DOB: 04-05-1935 DOA: 10/03/2018 PCP: [name], MD   Brief Narrative:  HPI On 10/03/2018 by Dr. Darreld Mclean Casey Harrell is a 84 y.o. female with medical history significant for PAF on Eliquis, Hx of CVA, IDT2DM, HTN, HLD, COPD, hypothyroidism, peripheral neuropathy, depression, and OSA who presents to the ED for evaluation of right facial droop.  Patient states she was in her usual state of health until around 3 PM on 10/03/2018.  She was sitting down on her couch when her son noticed that she had drooping of her right face and subsequently called EMS.  She had a headache at that time.  Patient denies any associated change in vision, slurred speech, dysphagia, chest pain, palpitations, dyspnea, abdominal pain, nausea, vomiting, diarrhea, dysuria, or focal weakness.  She says she normally ambulates with a cane but has not tried to walk since onset of her symptoms. Assessment & Plan   Acute CVA -Presented with right sided facial droop -CT head: No acute intracranial infarct -MRI head: Acute punctate 5 mm infarction in the right paramedian dorsal level or pons -MRA does not show any acute large or medium vessel occlusion -LDL 57, Hemoglobin A1c 9.6 -Carotid Doppler shows bilateral ICA 1 to 39% stenosis.  Bilateral vertebral arteries demonstrate antegrade flow -Echocardiogram pending -Neurology consulted and appreciated, pending further recommendations -PT OT recommending inpatient rehab -Patient rehab consulted -Continue Eliquis, aspirin, statin  Paroxysmal atrial fibrillation: -CHA2DS2-VASc Score is at least 7. -Continue Eliquis -Coreg held -Currently in sinus rhythm   Diabetes mellitus, type II -Hemoglobin A1c 9.6 -Continue Levemir, insulin sliding scale, CBG monitoring (see of note dose of Levemir decreased while hospitalized)  Hypertension -Given Acute CVA, allow for permissive HTN -Home meds: Amlodipine, Coreg, irbesartan  currently held  Hyperlipidemi -Continue statin  COPD -Stable, no wheezing on exam -Continue nebs PRN  Hypothyroidism -Continue Synthroid  GERD -Continue Protonix  Depression/anxiety -Continue duloxetine  Obstructive sleep apnea -Has been intolerant to CPAP.  Continue supplemental oxygen.  DVT Prophylaxis Eliquis  Code Status: DNR  Family Communication: None at bedside  Disposition Plan: Admitted.  Pending inpatient rehab consultation as well as further recommendations from neurology  Consultants Neurology Inpatient rehab  Procedures  Carotid Doppler Echocardiogram  Antibiotics   Anti-infectives (From admission, onward)   None      Subjective:   Casey Harrell seen and examined today.  Patient has no complaints this morning.  She states she is feeling much better.  She denies current chest pain, shortness of breath, abdominal pain, nausea or vomiting, diarrhea constipation, dizziness or headache.   Objective:   Vitals:   10/04/18 0456 10/04/18 0649 10/04/18 0853 10/04/18 1137  BP: (!) 133/53 124/85 126/60 (!) 147/53  Pulse: (!) 58 64 76 63  Resp: 18 18 15 17   Temp: 97.7 F (36.5 C) (!) 97.4 F (36.3 C) 98.8 F (37.1 C) 97.6 F (36.4 C)  TempSrc: Oral Oral Oral Oral  SpO2: 91% 95% 99% 94%  Weight:      Height:        Intake/Output Summary (Last 24 hours) at 10/04/2018 1422 Last data filed at 10/04/2018 1000 Gross per 24 hour  Intake 17.81 ml  Output 700 ml  Net -682.19 ml   Filed Weights   10/03/18 2132 10/03/18 2133  Weight: 61.7 kg 56.7 kg    Exam  General: Well developed, well nourished, NAD, appears stated age  HEENT: NCAT, mucous membranes moist.  Neck: Supple  Cardiovascular: S1 S2 auscultated, RRR  Respiratory: Clear to auscultation bilaterally with equal chest rise  Abdomen: Soft, nontender, nondistended, + bowel sounds  Extremities: warm dry without cyanosis clubbing or edema  Neuro: AAOx3, right-sided facial droop,  otherwise nonfocal  Psych: Normal affect and demeanor with intact judgement and insight   Data Reviewed: I have personally reviewed following labs and imaging studies  CBC: Recent Labs  Lab 10/03/18 2017 10/03/18 2021 10/04/18 0432  WBC 10.6*  --  10.5  NEUTROABS 4.9  --   --   HGB 13.1 13.9 12.9  HCT 39.9 41.0 39.0  MCV 90.7  --  88.4  PLT 267  --  259   Basic Metabolic Panel: Recent Labs  Lab 10/03/18 2017 10/03/18 2021 10/04/18 0432  NA 136 137 140  K 3.5 3.5 3.4*  CL 104 103 103  CO2 22  --  24  GLUCOSE 133* 131* 97  BUN 20 22 17   CREATININE 1.05* 1.00 0.88  CALCIUM 9.6  --  9.6   GFR: Estimated Creatinine Clearance: 37.8 mL/min (by C-G formula based on SCr of 0.88 mg/dL). Liver Function Tests: Recent Labs  Lab 10/03/18 2017  AST 15  ALT 12  ALKPHOS 96  BILITOT 0.5  PROT 7.0  ALBUMIN 3.7   No results for input(s): LIPASE, AMYLASE in the last 168 hours. No results for input(s): AMMONIA in the last 168 hours. Coagulation Profile: Recent Labs  Lab 10/03/18 2017  INR 1.4*   Cardiac Enzymes: No results for input(s): CKTOTAL, CKMB, CKMBINDEX, TROPONINI in the last 168 hours. BNP (last 3 results) No results for input(s): PROBNP in the last 8760 hours. HbA1C: Recent Labs    10/04/18 0432  HGBA1C 9.6*   CBG: Recent Labs  Lab 10/03/18 2355 10/04/18 0103 10/04/18 0618 10/04/18 1114  GLUCAP 68* 148* 99 166*   Lipid Profile: Recent Labs    10/04/18 0432  CHOL 101  HDL 24*  LDLCALC 57  TRIG 99  CHOLHDL 4.2   Thyroid Function Tests: No results for input(s): TSH, T4TOTAL, FREET4, T3FREE, THYROIDAB in the last 72 hours. Anemia Panel: No results for input(s): VITAMINB12, FOLATE, FERRITIN, TIBC, IRON, RETICCTPCT in the last 72 hours. Urine analysis:    Component Value Date/Time   COLORURINE YELLOW 04/04/2017 1104   APPEARANCEUR HAZY (A) 04/04/2017 1104   LABSPEC 1.012 04/04/2017 1104   PHURINE 6.0 04/04/2017 1104   GLUCOSEU 50 (A)  04/04/2017 1104   HGBUR NEGATIVE 04/04/2017 1104   BILIRUBINUR neg 10/15/2017 1527   KETONESUR NEGATIVE 04/04/2017 1104   PROTEINUR Positive (A) 10/15/2017 1527   PROTEINUR NEGATIVE 04/04/2017 1104   UROBILINOGEN 0.2 10/15/2017 1527   UROBILINOGEN 0.2 11/05/2014 1555   NITRITE neg 10/15/2017 1527   NITRITE NEGATIVE 04/04/2017 1104   LEUKOCYTESUR Negative 10/15/2017 1527   Sepsis Labs: @LABRCNTIP (procalcitonin:4,lacticidven:4)  ) Recent Results (from the past 240 hour(s))  SARS Coronavirus 2 (CEPHEID - Performed in [facility] hospital lab), Hosp Order     Status: None   Collection Time: 10/03/18  9:53 PM  Result Value Ref Range Status   SARS Coronavirus 2 NEGATIVE NEGATIVE Final    Comment: (NOTE) If result is NEGATIVE SARS-CoV-2 target nucleic acids are NOT DETECTED. The SARS-CoV-2 RNA is generally detectable in upper and lower  respiratory specimens during the acute phase of infection. The lowest  concentration of SARS-CoV-2 viral copies this assay can detect is 250  copies / mL. A negative result does not preclude SARS-CoV-2  infection  and should not be used as the sole basis for treatment or other  patient management decisions.  A negative result may occur with  improper specimen collection / handling, submission of specimen other  than nasopharyngeal swab, presence of viral mutation(s) within the  areas targeted by this assay, and inadequate number of viral copies  (<250 copies / mL). A negative result must be combined with clinical  observations, patient history, and epidemiological information. If result is POSITIVE SARS-CoV-2 target nucleic acids are DETECTED. The SARS-CoV-2 RNA is generally detectable in upper and lower  respiratory specimens dur ing the acute phase of infection.  Positive  results are indicative of active infection with SARS-CoV-2.  Clinical  correlation with patient history and other diagnostic information is  necessary to determine patient  infection status.  Positive results do  not rule out bacterial infection or co-infection with other viruses. If result is PRESUMPTIVE POSTIVE SARS-CoV-2 nucleic acids MAY BE PRESENT.   A presumptive positive result was obtained on the submitted specimen  and confirmed on repeat testing.  While 2019 novel coronavirus  (SARS-CoV-2) nucleic acids may be present in the submitted sample  additional confirmatory testing may be necessary for epidemiological  and / or clinical management purposes  to differentiate between  SARS-CoV-2 and other Sarbecovirus currently known to infect humans.  If clinically indicated additional testing with an alternate test  methodology [phone]) is advised. The SARS-CoV-2 RNA is generally  detectable in upper and lower respiratory sp ecimens during the acute  phase of infection. The expected result is Negative. Fact Sheet for Patients:  BoilerBrush.com.cy Fact Sheet for Healthcare Providers: https://pope.com/ This test is not yet approved or cleared by the [place] FDA and has been authorized for detection and/or diagnosis of SARS-CoV-2 by FDA under an Emergency Use Authorization (EUA).  This EUA will remain in effect (meaning this test can be used) for the duration of the COVID-19 declaration under Section 564(b)(1) of the Act, 21 U.S.C. section 360bbb-3(b)(1), unless the authorization is terminated or revoked sooner. Performed at [facility], [address] [place], [place] [address]       Radiology Studies: Mr Shirlee Latch QI Contrast  Result Date: 10/03/2018 CLINICAL DATA:  Code stroke.  Acute facial droop. EXAM: MRI HEAD WITHOUT CONTRAST MRA HEAD WITHOUT CONTRAST TECHNIQUE: Multiplanar, multiecho pulse sequences of the brain and surrounding structures were obtained without intravenous contrast. Angiographic images of the head were obtained using MRA technique without contrast. COMPARISON:  Head CT  same day.  MRI 03/30/2017 FINDINGS: MRI HEAD FINDINGS Brain: Acute 5 mm infarction in the right paramedian dorsal lower pons. No other acute insult. Elsewhere, there are chronic small-vessel ischemic changes affecting the pons, with a few small foci of hemosiderin deposition. There are old small vessel infarctions within the cerebellum, some with hemosiderin deposition. There is extensive chronic small vessel ischemic change throughout the cerebral hemispheric white matter. There are old occipital cortical and subcortical infarctions, more extensive on the left than the right. There are old lacunar infarctions affecting the thalami and basal ganglia. No mass lesion. No hydrocephalus or extra-axial collection. Vascular: There is atherosclerotic calcification of the major vessels at the base of the brain. Skull and upper cervical spine: Negative Sinuses/Orbits: Clear/normal Other: None MRA HEAD FINDINGS Both internal carotid arteries are patent through the skull base and siphon regions. The anterior and middle cerebral vessels are patent. There are multiple intracranial stenoses and areas of irregularity, more extensive in the left  anterior and middle cerebral vessels than right. Both vertebral arteries are patent to the basilar. 50% stenosis of the V4 segment on the left. Large right PICA. Basilar artery shows mild atherosclerotic irregularity but no flow limiting stenosis. There is flow within the superior cerebellar and posterior cerebral arteries, but the vessels show pronounced atherosclerotic irregularity and narrowing. IMPRESSION: Acute punctate 5 mm infarction in the right para median dorsal level or pons. Extensive small vessel ischemic changes elsewhere throughout the brainstem, cerebellum, thalami, basal ganglia and hemispheric white matter, some with old hemosiderin deposition. Old cortical infarctions in the occipital lobes, more extensive on the left than the right. MR angiography does not show any acute  large or medium vessel occlusion. There is advanced diffuse intracranial atherosclerotic disease, most pronounced in the left anterior and middle cerebral artery territories and in the posterior circulation branch vessels Electronically Signed   By: Paulina Fusi M.D.   On: 10/03/2018 21:22   Mr Brain Wo Contrast  Result Date: 10/03/2018 CLINICAL DATA:  Code stroke.  Acute facial droop. EXAM: MRI HEAD WITHOUT CONTRAST MRA HEAD WITHOUT CONTRAST TECHNIQUE: Multiplanar, multiecho pulse sequences of the brain and surrounding structures were obtained without intravenous contrast. Angiographic images of the head were obtained using MRA technique without contrast. COMPARISON:  Head CT same day.  MRI 03/30/2017 FINDINGS: MRI HEAD FINDINGS Brain: Acute 5 mm infarction in the right paramedian dorsal lower pons. No other acute insult. Elsewhere, there are chronic small-vessel ischemic changes affecting the pons, with a few small foci of hemosiderin deposition. There are old small vessel infarctions within the cerebellum, some with hemosiderin deposition. There is extensive chronic small vessel ischemic change throughout the cerebral hemispheric white matter. There are old occipital cortical and subcortical infarctions, more extensive on the left than the right. There are old lacunar infarctions affecting the thalami and basal ganglia. No mass lesion. No hydrocephalus or extra-axial collection. Vascular: There is atherosclerotic calcification of the major vessels at the base of the brain. Skull and upper cervical spine: Negative Sinuses/Orbits: Clear/normal Other: None MRA HEAD FINDINGS Both internal carotid arteries are patent through the skull base and siphon regions. The anterior and middle cerebral vessels are patent. There are multiple intracranial stenoses and areas of irregularity, more extensive in the left anterior and middle cerebral vessels than right. Both vertebral arteries are patent to the basilar. 50%  stenosis of the V4 segment on the left. Large right PICA. Basilar artery shows mild atherosclerotic irregularity but no flow limiting stenosis. There is flow within the superior cerebellar and posterior cerebral arteries, but the vessels show pronounced atherosclerotic irregularity and narrowing. IMPRESSION: Acute punctate 5 mm infarction in the right para median dorsal level or pons. Extensive small vessel ischemic changes elsewhere throughout the brainstem, cerebellum, thalami, basal ganglia and hemispheric white matter, some with old hemosiderin deposition. Old cortical infarctions in the occipital lobes, more extensive on the left than the right. MR angiography does not show any acute large or medium vessel occlusion. There is advanced diffuse intracranial atherosclerotic disease, most pronounced in the left anterior and middle cerebral artery territories and in the posterior circulation branch vessels Electronically Signed   By: Paulina Fusi M.D.   On: 10/03/2018 21:22   Ct Head Code Stroke Wo Contrast  Result Date: 10/03/2018 CLINICAL DATA:  Code stroke. Initial evaluation for acute facial droop. EXAM: CT HEAD WITHOUT CONTRAST TECHNIQUE: Contiguous axial images were obtained from the base of the skull through the vertex without intravenous contrast. COMPARISON:  Previous MRI from 03/30/2017 FINDINGS: Brain: Generalized age-related cerebral volume loss with moderate chronic microvascular ischemic disease. Encephalomalacia within the bilateral occipital lobes compatible with remote ischemic infarcts. Additional small remote left basal ganglia lacunar infarct, with additional tiny remote left cerebellar infarct. No acute intracranial hemorrhage. No acute large vessel territory infarct. No mass lesion, midline shift or mass effect. No hydrocephalus. No extra-axial fluid collection. Vascular: No hyperdense vessel. Scattered vascular calcifications noted within the carotid siphons. Skull: Scalp soft tissues and  calvarium within normal limits. Sinuses/Orbits: Globes and orbital soft tissues normal. Mild-to-moderate circumferential mucosal thickening within the right sphenoid sinus. Paranasal sinuses are otherwise clear. Small right mastoid effusion noted. Other: None. ASPECTS [facility] Stroke Program Early CT Score) - Ganglionic level infarction (caudate, lentiform nuclei, internal capsule, insula, M1-M3 cortex): 7 - Supraganglionic infarction (M4-M6 cortex): 3 Total score (0-10 with 10 being normal): 10 IMPRESSION: 1. No acute intracranial infarct or other abnormality identified. 2. ASPECTS is 10. 3. Multiple chronic ischemic infarcts involving the left basal angling the and bilateral occipital lobes. 4. Underlying age-related cerebral atrophy with moderate chronic small vessel ischemic disease. These results were communicated to Dr. Amada Jupiter at 8:42 pmon 5/28/2020by text page via the [facility] messaging system. Electronically Signed   By: Rise Mu M.D.   On: 10/03/2018 20:44   Vas US Carotid (at [facility])  Result Date: 10/04/2018 Carotid Arterial Duplex Study Indications:  CVA. Risk Factors: Hypertension, Diabetes. Performing Technologist: Blanch Media RVS  Examination Guidelines: A complete evaluation includes B-mode imaging, spectral Doppler, color Doppler, and power Doppler as needed of all accessible portions of each vessel. Bilateral testing is considered an integral part of a complete examination. Limited examinations for reoccurring indications may be performed as noted.  Right Carotid Findings: +----------+--------+--------+--------+------------+--------+           PSV cm/sEDV cm/sStenosisDescribe    Comments +----------+--------+--------+--------+------------+--------+ CCA Prox  71      11              heterogenous         +----------+--------+--------+--------+------------+--------+ CCA Distal120     18              heterogenous          +----------+--------+--------+--------+------------+--------+ ICA Prox  61      12      1-39%   heterogenous         +----------+--------+--------+--------+------------+--------+ ICA Distal65      20                                   +----------+--------+--------+--------+------------+--------+ ECA       86                                           +----------+--------+--------+--------+------------+--------+ +----------+--------+-------+--------+-------------------+           PSV cm/sEDV cmsDescribeArm Pressure (mmHG) +----------+--------+-------+--------+-------------------+ [id number]                                         +----------+--------+-------+--------+-------------------+ +---------+--------+--+--------+-+---------+ VertebralPSV cm/s42EDV cm/s8Antegrade +---------+--------+--+--------+-+---------+  Left Carotid Findings: +----------+--------+--------+--------+------------+--------+           PSV cm/sEDV cm/sStenosisDescribe    Comments +----------+--------+--------+--------+------------+--------+ CCA Prox  73  16              heterogenous         +----------+--------+--------+--------+------------+--------+ CCA Distal53      10              heterogenous         +----------+--------+--------+--------+------------+--------+ ICA Prox  48      14      1-39%   heterogenous         +----------+--------+--------+--------+------------+--------+ ICA Distal59      16                                   +----------+--------+--------+--------+------------+--------+ ECA       87                                           +----------+--------+--------+--------+------------+--------+ +----------+--------+--------+--------+-------------------+ SubclavianPSV cm/sEDV cm/sDescribeArm Pressure (mmHG) +----------+--------+--------+--------+-------------------+           93                                           +----------+--------+--------+--------+-------------------+ +---------+--------+--+--------+-+---------+ VertebralPSV cm/s22EDV cm/s4Antegrade +---------+--------+--+--------+-+---------+  Summary: Right Carotid: Velocities in the right ICA are consistent with a 1-39% stenosis. Left Carotid: Velocities in the left ICA are consistent with a 1-39% stenosis. Vertebrals: Bilateral vertebral arteries demonstrate antegrade flow. *See table(s) above for measurements and observations.  Electronically signed by Delia Heady MD on 10/04/2018 at 1:29:58 PM.    Final      Scheduled Meds: . apixaban  5 mg Oral BID  . aspirin EC  81 mg Oral Daily  . atorvastatin  40 mg Oral Daily  . DULoxetine  30 mg Oral BID  . insulin aspart  0-9 Units Subcutaneous TID WC  . levothyroxine  50 mcg Oral Q0600  . pantoprazole  40 mg Oral BID  . pregabalin  150 mg Oral Daily   Continuous Infusions:   LOS: 1 day   Time Spent in minutes   30 minutes  Kimball Manske D.O. on 10/04/2018 at 2:22 PM  Between 7am to 7pm - Please see pager noted on [email or web address]  After 7pm go to [email or web address]  And look for the night coverage person covering for me after hours  [facility] Office  [phone]

## 2018-10-04 NOTE — Progress Notes (Signed)
Inpatient Diabetes Program Recommendations  AACE/ADA: New Consensus Statement on Inpatient Glycemic Control  Target Ranges:  Prepandial:   less than 140 mg/dL      Peak postprandial:   less than 180 mg/dL (1-2 hours)      Critically ill patients:  140 - 180 mg/dL  Results for Casey Harrell, Casey Harrell (MRN [id number]) as of 10/04/2018 10:51  Ref. Range 10/03/2018 23:55 10/04/2018 01:03 10/04/2018 06:18  Glucose-Capillary Latest Ref Range: 70 - 99 mg/dL 68 (L) 400 (H) 99   Results for Casey Harrell, Casey Harrell (MRN [id number]) as of 10/04/2018 10:51  Ref. Range 08/28/2017 08:24 01/03/2018 15:43 10/04/2018 04:32  Hemoglobin A1C Latest Ref Range: 4.8 - 5.6 % 9.2 11.1 (H) 9.6 (H)   Review of Glycemic Control  Diabetes history: DM2 Outpatient Diabetes medications: Levemir 40 units BID, Humalog 9 units with breakfast, Humalog 14 units with supper, Metformin 250 mg BID (not taking Metformin) Current orders for Inpatient glycemic control: Novolog 0-9 units TID with meals  Inpatient Diabetes Program Recommendations:   HgbA1C: A1C 9.6% on 10/04/18 indicating an average glucose of 229 mg/dl over the past 2-3 months. Prior A1C was 11.1% on 01/03/18 and patient had phone visit with PCP on 08/07/18.   NOTE: Spoke with patient over the phone about diabetes and home regimen for diabetes control. Patient reports being followed by PCP for diabetes management and currently taking Levemir twice a day and Humalog twice a day (with breakfast and supper) as an outpatient for diabetes control. Patient states that she can not remember how many units she takes of Levemir or Humalog.  Inquired about Metformin and patient states that she is not taking Metformin. Patient reports taking Levemir and Humalog as prescribed and reports that she misses taking insulin about once a week (due to forgetting).  Patient reports checking glucose 1-2 times per day and that it is usually less than 200 mg/dl.  Inquired about prior A1C and patient reports not being able to  recall last A1C value. Discussed A1C results (9.6% on 10/04/18 ) and explained that current A1C indicates an average glucose of 229 mg/dl over the past 2-3 months. Discussed glucose and A1C goals. Discussed importance of checking CBGs and maintaining good CBG control to prevent long-term and short-term complications.  Encouraged patient to check glucose at least 2-3 times per day and to keep a log book of glucose readings and DM medication taken which patient will need to take to doctor appointments. Explained how the doctor can use the log book to continue to make adjustments with DM medications if needed.  Patient verbalized understanding of information discussed and reports no further questions at this time related to diabetes. Of note, patient is only ordered Novolog correction scale and glucose has ranged from 68-166 mg/dl since admitted.   Thanks, Orlando Penner, RN, MSN, CDE Diabetes Coordinator Inpatient Diabetes Program [phone] (Team Pager)

## 2018-10-04 NOTE — Evaluation (Signed)
Speech Language Pathology Evaluation Patient Details Name: Casey Harrell MRN: [id number] DOB: 06/05/1934 Today's Date: 10/04/2018 Time: 7829-5621 SLP Time Calculation (min) (ACUTE ONLY): 30 min  Problem List:  Patient Active Problem List   Diagnosis Date Noted  . COPD (chronic obstructive pulmonary disease) (HCC) 10/03/2018  . 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 Left pontine cerebrovascular accident (HCC) 04/03/2017  . History of CVA with residual deficit   . Type 2 diabetes mellitus with peripheral neuropathy (HCC)   . Anemia 03/30/2017  . History of stroke   . Chest pain 02/09/2016  . BPPV (benign paroxysmal positional vertigo) 08/25/2015  . HLD (hyperlipidemia) 08/25/2015  . At high risk for falls 07/01/2015  . Vertigo 06/21/2015  . Paroxysmal atrial fibrillation (HCC) 06/11/2015  . Cerebral infarction due to stenosis of left middle cerebral artery (HCC) 05/20/2015  . Intracranial vascular stenosis 05/20/2015  . Type 2 diabetes mellitus with circulatory disorder (HCC) 05/20/2015  . Type 2 diabetes, uncontrolled, with neuropathy (HCC)   . Abdominal pain   . Partial small bowel obstruction (HCC)   . Benign essential HTN   . Small bowel obstruction (HCC) 05/07/2015  . Hemiplegia, unspecified affecting right dominant side (HCC) 04/08/2015  . Embolic stroke involving posterior cerebral artery (HCC) 04/05/2015  . Orthostatic hypotension   . Hypertensive urgency 04/01/2015  . Fever in adult 04/01/2015  . Acute encephalopathy 04/01/2015  . Confusion   . CVA (cerebral infarction)   . Posterior circulation stroke (HCC)   . Acute ischemic stroke (HCC) 03/31/2015  . Stroke (HCC) 03/31/2015  . CKD (chronic kidney disease) stage 3, GFR 30-59 ml/min (HCC) 04/17/2014  . Gastroenteritis, non-infectious 04/11/2014  . Nausea vomiting and diarrhea 04/10/2014  . Hypokalemia 04/10/2014  . Dehydration 04/09/2014  . Bronchitis with airway obstruction (HCC) 02/03/2013  .  Leukocytosis 02/03/2013  . Fever 02/03/2013  . Acute respiratory failure with hypoxia (HCC) 02/03/2013  . Depression 02/03/2013  . Hyponatremia 02/03/2013  . Obesity (BMI 30-39.9) 01/10/2013  . PVD (peripheral vascular disease)- S/P RCE 06/18/09 12/13/2012  . Sleep apnea- C-pap intol 12/13/2012  . Low risk cardiac nuclear stress test- 2009 and Feb 2011 12/13/2012  . Mild persistent asthma 11/29/2011  . Chronic cough 06/05/2011  . Diabetic neuropathy (HCC) 09/02/2009  . Dyslipidemia 07/13/2009  . Hypothyroidism 11/12/2008  . Type 2 diabetes mellitus, uncontrolled (HCC) 07/15/2008  . Essential hypertension 07/15/2008  . GERD 07/15/2008  . DIVERTICULOSIS, COLON 07/15/2008   Past Medical History:  Past Medical History:  Diagnosis Date  . ASTHMA 07/15/2008  . COPD (chronic obstructive pulmonary disease) (HCC)   . DIABETES-TYPE 2 07/15/2008  . DIABETIC PERIPHERAL NEUROPATHY 09/02/2009  . GERD 07/15/2008  . HYPERTENSION 07/15/2008  . Normal cardiac stress test    low risk Nuc 2009, Feb 2011  . Paroxysmal atrial fibrillation (HCC)   . PVD (peripheral vascular disease) (HCC) 06/18/09   RCE  . Stroke (HCC)   . Vertigo    Past Surgical History:  Past Surgical History:  Procedure Laterality Date  . ABDOMINAL HYSTERECTOMY  1971  . APPENDECTOMY  1971  . CAROTID ENDARTERECTOMY  06/18/09   RCE  . CESAREAN SECTION    . colectomy and colostomy    . FLEXIBLE SIGMOIDOSCOPY     diverticulitis  . FOOT SURGERY     right foot X 2  . LAPAROTOMY     X 3 with adhesions lysis  . TONSILLECTOMY    . TUBAL LIGATION     HPI:  Casey Harrell is a 84 y.o. female with medical history significant for PAF on Eliquis, Hx of CVA, IDT2DM, HTN, HLD, COPD, hypothyroidism, peripheral neuropathy, depression, and OSA who presents to the ED for evaluation of right facial droop. MRI brain and MRA head without contrast were obtained and showed an acute punctate 5 mm infarction in the right paramedian dorsal level of pons  with extensive small vessel ischemic changes and old cortical infarcts in the occipital lobes, more extensive on the left than the right.    Assessment / Plan / Recommendation Clinical Impression  Patient presents with a mild-moderate right facial and bilabial weakness with decreased sensation, however this does not impact her speech intelligibilty. Her cognitive-linguistic function is WFL-WNL and exhibited good safety awareness, functional problem solving, memory and reasoning.    SLP Assessment  SLP Recommendation/Assessment: Patient does not need any further Speech Lanaguage Pathology Services SLP Visit Diagnosis: Dysarthria and anarthria (R47.1)    Follow Up Recommendations  None    Frequency and Duration   N/A        SLP Evaluation Cognition  Overall Cognitive Status: Within Functional Limits for tasks assessed Arousal/Alertness: Awake/alert Orientation Level: Oriented X4       Comprehension  Auditory Comprehension Overall Auditory Comprehension: Appears within functional limits for tasks assessed    Expression Expression Primary Mode of Expression: Verbal Verbal Expression Overall Verbal Expression: Appears within functional limits for tasks assessed Written Expression Dominant Hand: Right   Oral / Motor  Oral Motor/Sensory Function Overall Oral Motor/Sensory Function: Moderate impairment Facial ROM: Reduced right Facial Symmetry: Abnormal symmetry right Facial Strength: Reduced right Facial Sensation: Reduced right Lingual ROM: Within Functional Limits Lingual Symmetry: Within Functional Limits Lingual Strength: Within Functional Limits Lingual Sensation: Within Functional Limits Velum: Within Functional Limits Mandible: Within Functional Limits Motor Speech Overall Motor Speech: Appears within functional limits for tasks assessed Resonance: Within functional limits Articulation: Within functional limitis Motor Planning: Witnin functional limits   GO                     Pablo Lawrence 10/04/2018, 5:04 PM   Angela Nevin, MA, CCC-SLP Speech Therapy [facility] Pager: [phone]

## 2018-10-04 NOTE — Progress Notes (Signed)
Pt still NPO, synthroid held, pharmacy notified. Jorge Ny

## 2018-10-04 NOTE — Progress Notes (Signed)
Carotid duplex has been completed.   Preliminary results in CV Proc.   Blanch Media 10/04/2018 10:58 AM

## 2018-10-04 NOTE — Progress Notes (Signed)
STROKE TEAM PROGRESS NOTE   HISTORY OF PRESENT ILLNESS (per record) Casey Harrell is a 84 y.o. female with a history of diabetes, hypertension, previous stroke who presents with right facial weakness and right-sided arm and leg mild weakness. She states that she did not have any trouble walking until she tried to get into the ambulance.In the ER emergent CT done, which was negative.  Given multiple posterior circulation findings and a question of slightly hyperdense basilar, she was brought to MR I/MRA emergently to rule out basilar occlusion (she is allergic to IV contrast).  SUBJECTIVE (INTERVAL HISTORY) States she feels tired out. She continues to have right face<arm <leg weakness. She tells me she is relieved to be cleared for diet and is hungry!   OBJECTIVE Vitals:   10/04/18 0456 10/04/18 0649 10/04/18 0853 10/04/18 1137  BP: (!) 133/53 124/85 126/60 (!) 147/53  Pulse: (!) 58 64 76 63  Resp: Temp: 97.7 F (36.5 C) (!) 97.4 F (36.3 C) 98.8 F (37.1 C) 97.6 F (36.4 C)  TempSrc: Oral Oral Oral Oral  SpO2: 91% 95% 99% 94%  Weight:      Height:        CBC:  Recent Labs  Lab 10/03/18 2017 10/03/18 2021 10/04/18 0432  WBC 10.6*  --  10.5  NEUTROABS 4.9  --   --   HGB 13.1 13.9 12.9  HCT 39.9 41.0 39.0  MCV 90.7  --  88.4  PLT 267  --  259    Basic Metabolic Panel:  Recent Labs  Lab 10/03/18 2017 10/03/18 2021 10/04/18 0432  NA 136 137 140  K 3.5 3.5 3.4*  CL 104 103 103  CO2 22  --  24  GLUCOSE 133* 131* 97  BUN CREATININE 1.05* 1.00 0.88  CALCIUM 9.6  --  9.6    Lipid Panel:     Component Value Date/Time   CHOL 101 10/04/2018 0432   TRIG 99 10/04/2018 0432   HDL 24 (L) 10/04/2018 0432   CHOLHDL 4.2 10/04/2018 0432   VLDL 20 10/04/2018 0432   LDLCALC 57 10/04/2018 0432   HgbA1c:  Lab Results  Component Value Date   HGBA1C 9.6 (H) 10/04/2018   Urine Drug Screen:     Component Value Date/Time   LABOPIA NONE DETECTED  03/29/2017 2222   COCAINSCRNUR NONE DETECTED 03/29/2017 2222   LABBENZ NONE DETECTED 03/29/2017 2222   AMPHETMU NONE DETECTED 03/29/2017 2222   THCU NONE DETECTED 03/29/2017 2222   LABBARB NONE DETECTED 03/29/2017 2222    Alcohol Level     Component Value Date/Time   [facility] <10 10/03/2018 2017    IMAGING   Mr Maxine Glenn Head Wo Contrast  Result Date: 10/03/2018 CLINICAL DATA:  Code stroke.  Acute facial droop. EXAM: MRI HEAD WITHOUT CONTRAST MRA HEAD WITHOUT CONTRAST TECHNIQUE: Multiplanar, multiecho pulse sequences of the brain and surrounding structures were obtained without intravenous contrast. Angiographic images of the head were obtained using MRA technique without contrast. COMPARISON:  Head CT same day.  MRI 03/30/2017 FINDINGS: MRI HEAD FINDINGS Brain: Acute 5 mm infarction in the right paramedian dorsal lower pons. No other acute insult. Elsewhere, there are chronic small-vessel ischemic changes affecting the pons, with a few small foci of hemosiderin deposition. There are old small vessel infarctions within the cerebellum, some with hemosiderin deposition. There is extensive chronic small vessel ischemic change throughout the cerebral hemispheric white matter. There are old occipital  cortical and subcortical infarctions, more extensive on the left than the right. There are old lacunar infarctions affecting the thalami and basal ganglia. No mass lesion. No hydrocephalus or extra-axial collection. Vascular: There is atherosclerotic calcification of the major vessels at the base of the brain. Skull and upper cervical spine: Negative Sinuses/Orbits: Clear/normal Other: None MRA HEAD FINDINGS Both internal carotid arteries are patent through the skull base and siphon regions. The anterior and middle cerebral vessels are patent. There are multiple intracranial stenoses and areas of irregularity, more extensive in the left anterior and middle cerebral vessels than right. Both vertebral arteries are  patent to the basilar. 50% stenosis of the V4 segment on the left. Large right PICA. Basilar artery shows mild atherosclerotic irregularity but no flow limiting stenosis. There is flow within the superior cerebellar and posterior cerebral arteries, but the vessels show pronounced atherosclerotic irregularity and narrowing. IMPRESSION: Acute punctate 5 mm infarction in the right para median dorsal level or pons. Extensive small vessel ischemic changes elsewhere throughout the brainstem, cerebellum, thalami, basal ganglia and hemispheric white matter, some with old hemosiderin deposition. Old cortical infarctions in the occipital lobes, more extensive on the left than the right. MR angiography does not show any acute large or medium vessel occlusion. There is advanced diffuse intracranial atherosclerotic disease, most pronounced in the left anterior and middle cerebral artery territories and in the posterior circulation branch vessels Electronically Signed   By: Paulina Fusi M.D.   On: 10/03/2018 21:22   Mr Brain Wo Contrast  Result Date: 10/03/2018 CLINICAL DATA:  Code stroke.  Acute facial droop. EXAM: MRI HEAD WITHOUT CONTRAST MRA HEAD WITHOUT CONTRAST TECHNIQUE: Multiplanar, multiecho pulse sequences of the brain and surrounding structures were obtained without intravenous contrast. Angiographic images of the head were obtained using MRA technique without contrast. COMPARISON:  Head CT same day.  MRI 03/30/2017 FINDINGS: MRI HEAD FINDINGS Brain: Acute 5 mm infarction in the right paramedian dorsal lower pons. No other acute insult. Elsewhere, there are chronic small-vessel ischemic changes affecting the pons, with a few small foci of hemosiderin deposition. There are old small vessel infarctions within the cerebellum, some with hemosiderin deposition. There is extensive chronic small vessel ischemic change throughout the cerebral hemispheric white matter. There are old occipital cortical and subcortical  infarctions, more extensive on the left than the right. There are old lacunar infarctions affecting the thalami and basal ganglia. No mass lesion. No hydrocephalus or extra-axial collection. Vascular: There is atherosclerotic calcification of the major vessels at the base of the brain. Skull and upper cervical spine: Negative Sinuses/Orbits: Clear/normal Other: None MRA HEAD FINDINGS Both internal carotid arteries are patent through the skull base and siphon regions. The anterior and middle cerebral vessels are patent. There are multiple intracranial stenoses and areas of irregularity, more extensive in the left anterior and middle cerebral vessels than right. Both vertebral arteries are patent to the basilar. 50% stenosis of the V4 segment on the left. Large right PICA. Basilar artery shows mild atherosclerotic irregularity but no flow limiting stenosis. There is flow within the superior cerebellar and posterior cerebral arteries, but the vessels show pronounced atherosclerotic irregularity and narrowing. IMPRESSION: Acute punctate 5 mm infarction in the right para median dorsal level or pons. Extensive small vessel ischemic changes elsewhere throughout the brainstem, cerebellum, thalami, basal ganglia and hemispheric white matter, some with old hemosiderin deposition. Old cortical infarctions in the occipital lobes, more extensive on the left than the right. MR angiography does not show  any acute large or medium vessel occlusion. There is advanced diffuse intracranial atherosclerotic disease, most pronounced in the left anterior and middle cerebral artery territories and in the posterior circulation branch vessels Electronically Signed   By: Paulina Fusi M.D.   On: 10/03/2018 21:22   Ct Head Code Stroke Wo Contrast  Result Date: 10/03/2018 CLINICAL DATA:  Code stroke. Initial evaluation for acute facial droop. EXAM: CT HEAD WITHOUT CONTRAST TECHNIQUE: Contiguous axial images were obtained from the base of the  skull through the vertex without intravenous contrast. COMPARISON:  Previous MRI from 03/30/2017 FINDINGS: Brain: Generalized age-related cerebral volume loss with moderate chronic microvascular ischemic disease. Encephalomalacia within the bilateral occipital lobes compatible with remote ischemic infarcts. Additional small remote left basal ganglia lacunar infarct, with additional tiny remote left cerebellar infarct. No acute intracranial hemorrhage. No acute large vessel territory infarct. No mass lesion, midline shift or mass effect. No hydrocephalus. No extra-axial fluid collection. Vascular: No hyperdense vessel. Scattered vascular calcifications noted within the carotid siphons. Skull: Scalp soft tissues and calvarium within normal limits. Sinuses/Orbits: Globes and orbital soft tissues normal. Mild-to-moderate circumferential mucosal thickening within the right sphenoid sinus. Paranasal sinuses are otherwise clear. Small right mastoid effusion noted. Other: None. ASPECTS [facility] Stroke Program Early CT Score) - Ganglionic level infarction (caudate, lentiform nuclei, internal capsule, insula, M1-M3 cortex): 7 - Supraganglionic infarction (M4-M6 cortex): 3 Total score (0-10 with 10 being normal): 10 IMPRESSION: 1. No acute intracranial infarct or other abnormality identified. 2. ASPECTS is 10. 3. Multiple chronic ischemic infarcts involving the left basal angling the and bilateral occipital lobes. 4. Underlying age-related cerebral atrophy with moderate chronic small vessel ischemic disease. These results were communicated to Dr. Amada Jupiter at 8:42 pmon 5/28/2020by text page via the [facility] messaging system. Electronically Signed   By: Rise Mu M.D.   On: 10/03/2018 20:44   Vas US Carotid (at [facility])  Result Date: 10/04/2018 Carotid Arterial Duplex Study Indications:  CVA. Risk Factors: Hypertension, Diabetes. Performing Technologist: Blanch Media RVS  Examination Guidelines: A complete  evaluation includes B-mode imaging, spectral Doppler, color Doppler, and power Doppler as needed of all accessible portions of each vessel. Bilateral testing is considered an integral part of a complete examination. Limited examinations for reoccurring indications may be performed as noted.  Right Carotid Findings: +----------+--------+--------+--------+------------+--------+           PSV cm/sEDV cm/sStenosisDescribe    Comments +----------+--------+--------+--------+------------+--------+ CCA Prox  71      11              heterogenous         +----------+--------+--------+--------+------------+--------+ CCA Distal120     18              heterogenous         +----------+--------+--------+--------+------------+--------+ ICA Prox  61      12      1-39%   heterogenous         +----------+--------+--------+--------+------------+--------+ ICA Distal65      20                                   +----------+--------+--------+--------+------------+--------+ ECA       86                                           +----------+--------+--------+--------+------------+--------+ +----------+--------+-------+--------+-------------------+  PSV cm/sEDV cmsDescribeArm Pressure (mmHG) +----------+--------+-------+--------+-------------------+ [id number]                                         +----------+--------+-------+--------+-------------------+ +---------+--------+--+--------+-+---------+ VertebralPSV cm/s42EDV cm/s8Antegrade +---------+--------+--+--------+-+---------+  Left Carotid Findings: +----------+--------+--------+--------+------------+--------+           PSV cm/sEDV cm/sStenosisDescribe    Comments +----------+--------+--------+--------+------------+--------+ CCA Prox  73      16              heterogenous         +----------+--------+--------+--------+------------+--------+ CCA Distal53      10              heterogenous          +----------+--------+--------+--------+------------+--------+ ICA Prox  48      14      1-39%   heterogenous         +----------+--------+--------+--------+------------+--------+ ICA Distal59      16                                   +----------+--------+--------+--------+------------+--------+ ECA       87                                           +----------+--------+--------+--------+------------+--------+ +----------+--------+--------+--------+-------------------+ SubclavianPSV cm/sEDV cm/sDescribeArm Pressure (mmHG) +----------+--------+--------+--------+-------------------+           93                                          +----------+--------+--------+--------+-------------------+ +---------+--------+--+--------+-+---------+ VertebralPSV cm/s22EDV cm/s4Antegrade +---------+--------+--+--------+-+---------+  Summary: Right Carotid: Velocities in the right ICA are consistent with a 1-39% stenosis. Left Carotid: Velocities in the left ICA are consistent with a 1-39% stenosis. Vertebrals: Bilateral vertebral arteries demonstrate antegrade flow. *See table(s) above for measurements and observations.     Preliminary      Transthoracic Echocardiogram  Normal left ventricular ejection fraction.  No wall motion abnormalities.  EKG -rhythm.  Old anterior septal infarct.  Low voltage.   PHYSICAL EXAM Blood pressure (!) 147/53, pulse 63, temperature 97.6 F (36.4 C), temperature source Oral, resp. rate 17, height  (1.549 m), weight 56.7 kg, SpO2 94 %.  Constitutional: Appears well-developed and well-nourished elderly Caucasian lady. No acute distress Psych: Affect appropriate to situation Eyes: No scleral injection HENT: No OP obstrucion Head: Normocephalic.  Cardiovascular: Normal rate and regular rhythm.  Respiratory: Effort normal, non-labored breathing GI: Soft.  No distension. There is no tenderness.  Skin: WDI  Neuro: Mental Status: Patient  is awake, alert, oriented to person, place, age, but unable to give month Speech is slow, but fluent. No neglect Cranial Nerves: PERRL, although visual fields are full, she is inconsitently naming corrent finger counts in all fields. She denies double/blurred vision. 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 Facial sensation is decreased on right. Facial movement is weak on the right including the upper portion Motor: She has mild 4+/5 weakness in the right arm and leg, with some mild fine motor slowing in right hand also noted. normal on the  left  sensory: Sensation is diminished on the left to cold temperature Cerebellar: She has impaired finger-nose-finger in the right arm and leg, appears consistent with weakness in the right leg, but appears out of proportion to weakness in the right arm  HOME MEDICATIONS:  Medications Prior to Admission  Medication Sig Dispense Refill  . ACCU-CHEK AVIVA PLUS test strip TEST ONCE DAILY. DX E11.9 100 each 3  . acetaminophen (TYLENOL) 325 MG tablet Take 650 mg by mouth every 6 (six) hours as needed (pain).    Marland Kitchen albuterol (ACCUNEB) 1.25 MG/3ML nebulizer solution Take 3 mLs (1.25 mg total) by nebulization every 4 (four) hours as needed for wheezing. Reported on 04/26/2015 75 mL 12  . Albuterol Sulfate (PROAIR RESPICLICK) 108 (90 Base) MCG/ACT AEPB Inhale 2 puffs into the lungs every 4 (four) hours as needed. 1 each 1  . amLODipine (NORVASC) 5 MG tablet Take 1 tablet (5 mg total) by mouth daily. 30 tablet 5  . atorvastatin (LIPITOR) 40 MG tablet TAKE 1 TABLET BY MOUTH EVERY DAY 90 tablet 3  . CALCIUM PO Take 1 tablet by mouth daily.    . carvedilol (COREG) 6.25 MG tablet TAKE 1 TABLET BY MOUTH TWICE A DAY WITH MEALS 180 tablet 1  . Cholecalciferol (VITAMIN D3) 2000 units TABS Take 2,000 Units by mouth daily. Reported on 04/26/2015 30 tablet 0  . diclofenac sodium (VOLTAREN) 1 % GEL  APPLY 2GRAMS TOPICALLY 4 TIMES DAILY TO RIGHT ANKLE 100 g 1  . DULoxetine (CYMBALTA) 30 MG capsule TAKE 1 CAPSULE ( ) TWICE DAILY. 180 capsule 1  . ELIQUIS 5 MG TABS tablet TAKE 1 TABLET BY MOUTH TWICE A DAY 60 tablet 0  . glucose blood (ACCU-CHEK AVIVA PLUS) test strip Test twice daily.  Dx e11.9 200 each 12  . HUMALOG KWIKPEN 100 UNIT/ML KwikPen INJECT 9-14 UNITS SUBCUTANEOUSLY TWICE DAILY WITH MEAL (9 UNITS BREAKFAST AND 14 UNITS AFTER SUPPER) 15 mL 1  . Insulin Detemir (LEVEMIR FLEXTOUCH) 100 UNIT/ML Pen INJECT 40 UNITS INTO THE SKIN TWICE A DAY 3 mL 5  . ipratropium-albuterol (DUONEB) 0.5-2.5 (3) MG/3ML SOLN Take 3 mLs by nebulization every 6 (six) hours as needed (shortness of breath or wheezing). 360 mL 0  . irbesartan (AVAPRO) 150 MG tablet TAKE 1 TABLET BY MOUTH EVERY DAY 90 tablet 1  . LEVEMIR FLEXTOUCH 100 UNIT/ML Pen INJECT 40 UNITS INTO THE SKIN TWICE A DAY 15 mL 1  . levothyroxine (SYNTHROID) 50 MCG tablet TAKE 1 TABLET BY MOUTH EVERY DAY BEFORE BREAKFAST 90 tablet 0  . LORazepam (ATIVAN) 0.5 MG tablet TAKE 1 TABLET (0.5 MG TOTAL) BY MOUTH EVERY 8 (EIGHT) HOURS AS NEEDED FOR ANXIETY. 20 tablet 0  . LYRICA 150 MG capsule TAKE ONE CAPSULE BY MOUTH EVERY DAY 90 capsule 1  . metFORMIN (GLUCOPHAGE) 500 MG tablet Take 1/2 tablet by mouth twice a day with a meal. 90 tablet 0  . ondansetron (ZOFRAN-ODT) 4 MG disintegrating tablet Take 1 tablet (4 mg total) by mouth every 8 (eight) hours as needed for nausea or vomiting. 20 tablet 0  . ONE TOUCH ULTRA TEST test strip TEST TWICE DAILY 100 each 12  . pantoprazole (PROTONIX) 40 MG tablet TAKE 1 TABLET BY MOUTH TWICE A DAY 180 tablet 2  . traZODone (DESYREL) 50 MG tablet TAKE 1/2 TO 1 TABLET BY MOUTH AT BEDTIME AS NEEDED FOR SLEEP 90 tablet 1      HOSPITAL MEDICATIONS:  . apixaban  5 mg Oral BID  .  aspirin EC  81 mg Oral Daily  . atorvastatin  40 mg Oral Daily  . DULoxetine  30 mg Oral BID  . insulin aspart  0-9 Units Subcutaneous TID WC   . levothyroxine  50 mcg Oral Q0600  . pantoprazole  40 mg Oral BID  . pregabalin  150 mg Oral Daily    ALLERGIES Allergies  Allergen Reactions  . Doxycycline Hyclate Nausea And Vomiting  . Morphine Sulfate Other (See Comments)    REACTION: hallucinations  . Iohexol Hives     Code: HIVES, Desc: pt. states she breaks out in hives 06/15/08   . Moxifloxacin Other (See Comments)    REACTION: unknown   ASSESSMENT/PLAN Casey Harrell is a 84 y.o. female with history of diabetes, hypertension, previous stroke  presenting with new onset facial droop and then noted subsequent right arm/leg weakness. She did not receive IV t-PA due to outside time window and no LVO.   Pontine lacunar stroke   Resultant  Right facial droop with loss of sensation and right side weakness  CT head- neg  MRI head -Rt paramedial lower pons infarct. Old cerebellum infarcts as well as prev old pons infarct and multiple old lacunar insults bilat in thalamus and BG.  MRA head - Extensive small vessel ischemic dz and cerebral intracranial athero noted throughout.   Carotid Doppler - neg  2D Echo - pending  Sars Corona Virus 2 neg  LDL - 57  HgbA1c - 9.6  UDS - neg  VTE prophylaxis - Eliquis  Diet per SLP  Eliquis prior to admission, now on Eliquis  Patient counseled to be compliant with her antithrombotic medications  Ongoing aggressive stroke risk factor management  Therapy recommendations:  pending  Disposition:  Pending  Hypertension  Stable . Permissive hypertension (OK if < 220/120) but gradually normalize in 5-7 days . Long-term BP goal normotensive  Hyperlipidemia  Lipid lowering medication PTA: Lipitor 40mg   LDL 54, goal < 70  Current lipid lowering medication:decreased Lipitor to 20mg  given age and very low LDL  Continue statin at discharge  Diabetes  HgbA1c 9.6, goal < 7.0  Uncontrolled  Other Stroke Risk Factors  Advanced age  Hx stroke/TIA  Family hx  stroke  Other Active Problems  Diabetic neuropathy, [facility]'t [facility]  Hospital day # 1  Desiree Metzger-Cihelka, ARNP-C, ANVP-BC Pager: [phone] I have personally obtained history,examined this patient, reviewed notes, independently viewed imaging studies, participated in medical decision making and plan of care.ROS completed by me personally and pertinent positives fully documented  I have made any additions or clarifications directly to the above note. Agree with note above.  She has presented with pontine infarct likely from small vessel disease.  She has paroxysmal A. fib and is on long-term anticoagulation with Eliquis and also has changes of intracranial atherosclerosis.  Addition of aspirin to Eliquis has not been shown to be beneficial but does increase the risk of bleeding.  Similarly changing Eliquis to alternative anticoagulant has also not been shown to be any beneficial.  Recommend continue Eliquis and aggressive risk factor modification particularly diabetes.  Continue ongoing therapies.  Discussed with Dr. Catha GosselinMikhail.  Greater than 50% time during this 25-minute visit was spent on counseling and coordination of care about her lacunar stroke and discussion about stroke prevention and treatment.  Stroke team will sign off.  Follow-up as an outpatient stroke clinic in 6 weeks.  Delia HeadyPramod , MD Medical Director [facility] Pager: [phone] 10/04/2018 3:55  PM To contact Stroke Continuity provider, please refer to [email or web address]. After hours, contact General Neurology

## 2018-10-05 LAB — BASIC METABOLIC PANEL
Anion gap: 10 (ref 5–15)
BUN: 20 mg/dL (ref 8–23)
CO2: 24 mmol/L (ref 22–32)
Calcium: 8.9 mg/dL (ref 8.9–10.3)
Chloride: 103 mmol/L (ref 98–111)
Creatinine, Ser: 1.24 mg/dL — ABNORMAL HIGH (ref 0.44–1.00)
GFR calc Af Amer: 47 mL/min — ABNORMAL LOW (ref >60)
GFR calc non Af Amer: 41 mL/min — ABNORMAL LOW (ref >60)
Glucose, Bld: 261 mg/dL — ABNORMAL HIGH (ref 70–99)
Potassium: 3.9 mmol/L (ref 3.5–5.1)
Sodium: 137 mmol/L (ref 135–145)

## 2018-10-05 LAB — GLUCOSE, CAPILLARY
Glucose-Capillary: 233 mg/dL — ABNORMAL HIGH (ref 70–99)
Glucose-Capillary: 236 mg/dL — ABNORMAL HIGH (ref 70–99)
Glucose-Capillary: 296 mg/dL — ABNORMAL HIGH (ref 70–99)
Glucose-Capillary: 260 mg/dL — ABNORMAL HIGH (ref 70–99)

## 2018-10-05 MED ORDER — TRAZODONE HCL 50 MG PO TABS
50.0000 mg | ORAL_TABLET | Freq: Once | ORAL | Status: AC
  Administered 2018-10-05: 50 mg via ORAL
  Filled 2018-10-05: qty 1

## 2018-10-05 NOTE — Progress Notes (Signed)
PROGRESS NOTE    Casey Harrell  NWG:[id number] DOB: 24-May-1934 DOA: 10/03/2018 PCP: [name], MD   Brief Narrative:  HPI On 10/03/2018 by Dr. Darreld Mclean Casey Harrell is a 84 y.o. female with medical history significant for PAF on Eliquis, Hx of CVA, IDT2DM, HTN, HLD, COPD, hypothyroidism, peripheral neuropathy, depression, and OSA who presents to the ED for evaluation of right facial droop.  Patient states she was in her usual state of health until around 3 PM on 10/03/2018.  She was sitting down on her couch when her son noticed that she had drooping of her right face and subsequently called EMS.  She had a headache at that time.  Patient denies any associated change in vision, slurred speech, dysphagia, chest pain, palpitations, dyspnea, abdominal pain, nausea, vomiting, diarrhea, dysuria, or focal weakness.  She says she normally ambulates with a cane but has not tried to walk since onset of her symptoms.  Interim history Admitted with acute CVA. Pending CIR.   Assessment & Plan   Acute CVA -Presented with right sided facial droop -CT head: No acute intracranial infarct -MRI head: Acute punctate 5 mm infarction in the right paramedian dorsal level or pons -MRA does not show any acute large or medium vessel occlusion -LDL 57, Hemoglobin A1c 9.6 -Carotid Doppler shows bilateral ICA 1 to 39% stenosis.  Bilateral vertebral arteries demonstrate antegrade flow -Echocardiogram pending -Neurology consulted and appreciated, Discussed with [name]- Aspirin discontinued, given patient's age and increased risk of bleeding with Eliquis and aspirin.  -PT OT recommending inpatient rehab -Patient rehab consulted- pending   Paroxysmal atrial fibrillation: -CHA2DS2-VASc Score is at least 7. -Continue Eliquis -Coreg held -Currently in sinus rhythm   Diabetes mellitus, type II -Hemoglobin A1c 9.6 -Continue Levemir, insulin sliding scale, CBG monitoring (see of note dose of Levemir  decreased while hospitalized)  Hypertension -Given Acute CVA, allow for permissive HTN -Home meds: Amlodipine, Coreg, irbesartan currently held  Hyperlipidemia -Continue statin  COPD -Stable, no wheezing on exam -Continue nebs PRN  Hypothyroidism -Continue Synthroid  GERD -Continue Protonix  Depression/anxiety -Continue duloxetine  Obstructive sleep apnea -Has been intolerant to CPAP.  Continue supplemental oxygen.  DVT Prophylaxis Eliquis  Code Status: DNR  Family Communication: None at bedside  Disposition Plan: Admitted.  Pending inpatient rehab consultation   Consultants Neurology Inpatient rehab  Procedures  Carotid Doppler Echocardiogram  Antibiotics   Anti-infectives (From admission, onward)   None      Subjective:   Casey Harrell seen and examined today.  Patient has no complaints this morning other than wanting a cup of coffee.  Denies chest pain, shortness of breath, abdominal pain, nausea or vomiting, diarrhea constipation, dizziness or headache.    Objective:   Vitals:   10/04/18 2003 10/04/18 2347 10/05/18 0336 10/05/18 0824  BP: 139/62 129/66 (!) 156/57 126/60  Pulse: 68 63 63 61  Resp: 18 16 15 16   Temp: 98.4 F (36.9 C) 98 F (36.7 C) 97.8 F (36.6 C) 97.6 F (36.4 C)  TempSrc: Oral Oral  Oral  SpO2: 94% 98% 96% 100%  Weight:      Height:        Intake/Output Summary (Last 24 hours) at 10/05/2018 0950 Last data filed at 10/05/2018 0615 Gross per 24 hour  Intake 420 ml  Output 1100 ml  Net -680 ml   Filed Weights   10/03/18 2132 10/03/18 2133  Weight: 61.7 kg 56.7 kg   Exam  General: Well developed,  well nourished, NAD, appears stated age  HEENT: NCAT, mucous membranes moist.   Cardiovascular: S1 S2 auscultated, RRR  Respiratory: Clear to auscultation bilaterally  Abdomen: Soft, nontender, nondistended, + bowel sounds  Extremities: warm dry without cyanosis clubbing or edema  Neuro: AAOx3, Right sided  weakness, right facial droop. Unable to look to the right, left gaze nystagmus   Psych: Normal affect and demeanor with intact judgement and insight   Data Reviewed: I have personally reviewed following labs and imaging studies  CBC: Recent Labs  Lab 10/03/18 2017 10/03/18 2021 10/04/18 0432  WBC 10.6*  --  10.5  NEUTROABS 4.9  --   --   HGB 13.1 13.9 12.9  HCT 39.9 41.0 39.0  MCV 90.7  --  88.4  PLT 267  --  259   Basic Metabolic Panel: Recent Labs  Lab 10/03/18 2017 10/03/18 2021 10/04/18 0432 10/05/18 0353  NA 136 137 140 137  K 3.5 3.5 3.4* 3.9  CL 104 103 103 103  CO2 22  --  24 24  GLUCOSE 133* 131* 97 261*  BUN 20 22 17 20   CREATININE 1.05* 1.00 0.88 1.24*  CALCIUM 9.6  --  9.6 8.9   GFR: Estimated Creatinine Clearance: 26.9 mL/min (A) (by C-G formula based on SCr of 1.24 mg/dL (H)). Liver Function Tests: Recent Labs  Lab 10/03/18 2017  AST 15  ALT 12  ALKPHOS 96  BILITOT 0.5  PROT 7.0  ALBUMIN 3.7   No results for input(s): LIPASE, AMYLASE in the last 168 hours. No results for input(s): AMMONIA in the last 168 hours. Coagulation Profile: Recent Labs  Lab 10/03/18 2017  INR 1.4*   Cardiac Enzymes: No results for input(s): CKTOTAL, CKMB, CKMBINDEX, TROPONINI in the last 168 hours. BNP (last 3 results) No results for input(s): PROBNP in the last 8760 hours. HbA1C: Recent Labs    10/04/18 0432  HGBA1C 9.6*   CBG: Recent Labs  Lab 10/04/18 0618 10/04/18 1114 10/04/18 1700 10/04/18 2146 10/05/18 0642  GLUCAP 99 166* 283* 342* 233*   Lipid Profile: Recent Labs    10/04/18 0432  CHOL 101  HDL 24*  LDLCALC 57  TRIG 99  CHOLHDL 4.2   Thyroid Function Tests: No results for input(s): TSH, T4TOTAL, FREET4, T3FREE, THYROIDAB in the last 72 hours. Anemia Panel: No results for input(s): VITAMINB12, FOLATE, FERRITIN, TIBC, IRON, RETICCTPCT in the last 72 hours. Urine analysis:    Component Value Date/Time   COLORURINE YELLOW  04/04/2017 1104   APPEARANCEUR HAZY (A) 04/04/2017 1104   LABSPEC 1.012 04/04/2017 1104   PHURINE 6.0 04/04/2017 1104   GLUCOSEU 50 (A) 04/04/2017 1104   HGBUR NEGATIVE 04/04/2017 1104   BILIRUBINUR neg 10/15/2017 1527   KETONESUR NEGATIVE 04/04/2017 1104   PROTEINUR Positive (A) 10/15/2017 1527   PROTEINUR NEGATIVE 04/04/2017 1104   UROBILINOGEN 0.2 10/15/2017 1527   UROBILINOGEN 0.2 11/05/2014 1555   NITRITE neg 10/15/2017 1527   NITRITE NEGATIVE 04/04/2017 1104   LEUKOCYTESUR Negative 10/15/2017 1527   Sepsis Labs: @LABRCNTIP (procalcitonin:4,lacticidven:4)  ) Recent Results (from the past 240 hour(s))  SARS Coronavirus 2 (CEPHEID - Performed in [facility] hospital lab), Hosp Order     Status: None   Collection Time: 10/03/18  9:53 PM  Result Value Ref Range Status   SARS Coronavirus 2 NEGATIVE NEGATIVE Final    Comment: (NOTE) If result is NEGATIVE SARS-CoV-2 target nucleic acids are NOT DETECTED. The SARS-CoV-2 RNA is generally detectable in upper and lower  respiratory specimens during the acute phase of infection. The lowest  concentration of SARS-CoV-2 viral copies this assay can detect is 250  copies / mL. A negative result does not preclude SARS-CoV-2 infection  and should not be used as the sole basis for treatment or other  patient management decisions.  A negative result may occur with  improper specimen collection / handling, submission of specimen other  than nasopharyngeal swab, presence of viral mutation(s) within the  areas targeted by this assay, and inadequate number of viral copies  (<250 copies / mL). A negative result must be combined with clinical  observations, patient history, and epidemiological information. If result is POSITIVE SARS-CoV-2 target nucleic acids are DETECTED. The SARS-CoV-2 RNA is generally detectable in upper and lower  respiratory specimens dur ing the acute phase of infection.  Positive  results are indicative of active  infection with SARS-CoV-2.  Clinical  correlation with patient history and other diagnostic information is  necessary to determine patient infection status.  Positive results do  not rule out bacterial infection or co-infection with other viruses. If result is PRESUMPTIVE POSTIVE SARS-CoV-2 nucleic acids MAY BE PRESENT.   A presumptive positive result was obtained on the submitted specimen  and confirmed on repeat testing.  While 2019 novel coronavirus  (SARS-CoV-2) nucleic acids may be present in the submitted sample  additional confirmatory testing may be necessary for epidemiological  and / or clinical management purposes  to differentiate between  SARS-CoV-2 and other Sarbecovirus currently known to infect humans.  If clinically indicated additional testing with an alternate test  methodology [phone]) is advised. The SARS-CoV-2 RNA is generally  detectable in upper and lower respiratory sp ecimens during the acute  phase of infection. The expected result is Negative. Fact Sheet for Patients:  BoilerBrush.com.cy Fact Sheet for Healthcare Providers: https://pope.com/ This test is not yet approved or cleared by the [place] FDA and has been authorized for detection and/or diagnosis of SARS-CoV-2 by FDA under an Emergency Use Authorization (EUA).  This EUA will remain in effect (meaning this test can be used) for the duration of the COVID-19 declaration under Section 564(b)(1) of the Act, 21 U.S.C. section 360bbb-3(b)(1), unless the authorization is terminated or revoked sooner. Performed at [facility], [address], [place], [place] [address]       Radiology Studies: Mr Shirlee Latch VH Contrast  Result Date: 10/03/2018 CLINICAL DATA:  Code stroke.  Acute facial droop. EXAM: MRI HEAD WITHOUT CONTRAST MRA HEAD WITHOUT CONTRAST TECHNIQUE: Multiplanar, multiecho pulse sequences of the brain and surrounding structures were  obtained without intravenous contrast. Angiographic images of the head were obtained using MRA technique without contrast. COMPARISON:  Head CT same day.  MRI 03/30/2017 FINDINGS: MRI HEAD FINDINGS Brain: Acute 5 mm infarction in the right paramedian dorsal lower pons. No other acute insult. Elsewhere, there are chronic small-vessel ischemic changes affecting the pons, with a few small foci of hemosiderin deposition. There are old small vessel infarctions within the cerebellum, some with hemosiderin deposition. There is extensive chronic small vessel ischemic change throughout the cerebral hemispheric white matter. There are old occipital cortical and subcortical infarctions, more extensive on the left than the right. There are old lacunar infarctions affecting the thalami and basal ganglia. No mass lesion. No hydrocephalus or extra-axial collection. Vascular: There is atherosclerotic calcification of the major vessels at the base of the brain. Skull and upper cervical spine: Negative Sinuses/Orbits: Clear/normal Other: None MRA HEAD FINDINGS Both internal  carotid arteries are patent through the skull base and siphon regions. The anterior and middle cerebral vessels are patent. There are multiple intracranial stenoses and areas of irregularity, more extensive in the left anterior and middle cerebral vessels than right. Both vertebral arteries are patent to the basilar. 50% stenosis of the V4 segment on the left. Large right PICA. Basilar artery shows mild atherosclerotic irregularity but no flow limiting stenosis. There is flow within the superior cerebellar and posterior cerebral arteries, but the vessels show pronounced atherosclerotic irregularity and narrowing. IMPRESSION: Acute punctate 5 mm infarction in the right para median dorsal level or pons. Extensive small vessel ischemic changes elsewhere throughout the brainstem, cerebellum, thalami, basal ganglia and hemispheric white matter, some with old  hemosiderin deposition. Old cortical infarctions in the occipital lobes, more extensive on the left than the right. MR angiography does not show any acute large or medium vessel occlusion. There is advanced diffuse intracranial atherosclerotic disease, most pronounced in the left anterior and middle cerebral artery territories and in the posterior circulation branch vessels Electronically Signed   By: Paulina Fusi M.D.   On: 10/03/2018 21:22   Mr Brain Wo Contrast  Result Date: 10/03/2018 CLINICAL DATA:  Code stroke.  Acute facial droop. EXAM: MRI HEAD WITHOUT CONTRAST MRA HEAD WITHOUT CONTRAST TECHNIQUE: Multiplanar, multiecho pulse sequences of the brain and surrounding structures were obtained without intravenous contrast. Angiographic images of the head were obtained using MRA technique without contrast. COMPARISON:  Head CT same day.  MRI 03/30/2017 FINDINGS: MRI HEAD FINDINGS Brain: Acute 5 mm infarction in the right paramedian dorsal lower pons. No other acute insult. Elsewhere, there are chronic small-vessel ischemic changes affecting the pons, with a few small foci of hemosiderin deposition. There are old small vessel infarctions within the cerebellum, some with hemosiderin deposition. There is extensive chronic small vessel ischemic change throughout the cerebral hemispheric white matter. There are old occipital cortical and subcortical infarctions, more extensive on the left than the right. There are old lacunar infarctions affecting the thalami and basal ganglia. No mass lesion. No hydrocephalus or extra-axial collection. Vascular: There is atherosclerotic calcification of the major vessels at the base of the brain. Skull and upper cervical spine: Negative Sinuses/Orbits: Clear/normal Other: None MRA HEAD FINDINGS Both internal carotid arteries are patent through the skull base and siphon regions. The anterior and middle cerebral vessels are patent. There are multiple intracranial stenoses and areas  of irregularity, more extensive in the left anterior and middle cerebral vessels than right. Both vertebral arteries are patent to the basilar. 50% stenosis of the V4 segment on the left. Large right PICA. Basilar artery shows mild atherosclerotic irregularity but no flow limiting stenosis. There is flow within the superior cerebellar and posterior cerebral arteries, but the vessels show pronounced atherosclerotic irregularity and narrowing. IMPRESSION: Acute punctate 5 mm infarction in the right para median dorsal level or pons. Extensive small vessel ischemic changes elsewhere throughout the brainstem, cerebellum, thalami, basal ganglia and hemispheric white matter, some with old hemosiderin deposition. Old cortical infarctions in the occipital lobes, more extensive on the left than the right. MR angiography does not show any acute large or medium vessel occlusion. There is advanced diffuse intracranial atherosclerotic disease, most pronounced in the left anterior and middle cerebral artery territories and in the posterior circulation branch vessels Electronically Signed   By: Paulina Fusi M.D.   On: 10/03/2018 21:22   Ct Head Code Stroke Wo Contrast  Result Date: 10/03/2018 CLINICAL DATA:  Code stroke. Initial evaluation for acute facial droop. EXAM: CT HEAD WITHOUT CONTRAST TECHNIQUE: Contiguous axial images were obtained from the base of the skull through the vertex without intravenous contrast. COMPARISON:  Previous MRI from 03/30/2017 FINDINGS: Brain: Generalized age-related cerebral volume loss with moderate chronic microvascular ischemic disease. Encephalomalacia within the bilateral occipital lobes compatible with remote ischemic infarcts. Additional small remote left basal ganglia lacunar infarct, with additional tiny remote left cerebellar infarct. No acute intracranial hemorrhage. No acute large vessel territory infarct. No mass lesion, midline shift or mass effect. No hydrocephalus. No extra-axial  fluid collection. Vascular: No hyperdense vessel. Scattered vascular calcifications noted within the carotid siphons. Skull: Scalp soft tissues and calvarium within normal limits. Sinuses/Orbits: Globes and orbital soft tissues normal. Mild-to-moderate circumferential mucosal thickening within the right sphenoid sinus. Paranasal sinuses are otherwise clear. Small right mastoid effusion noted. Other: None. ASPECTS [facility] Stroke Program Early CT Score) - Ganglionic level infarction (caudate, lentiform nuclei, internal capsule, insula, M1-M3 cortex): 7 - Supraganglionic infarction (M4-M6 cortex): 3 Total score (0-10 with 10 being normal): 10 IMPRESSION: 1. No acute intracranial infarct or other abnormality identified. 2. ASPECTS is 10. 3. Multiple chronic ischemic infarcts involving the left basal angling the and bilateral occipital lobes. 4. Underlying age-related cerebral atrophy with moderate chronic small vessel ischemic disease. These results were communicated to Dr. Amada Jupiter at 8:42 pmon 5/28/2020by text page via the [facility] messaging system. Electronically Signed   By: Rise Mu M.D.   On: 10/03/2018 20:44   Vas US Carotid (at [facility] Only)  Result Date: 10/04/2018 Carotid Arterial Duplex Study Indications:  CVA. Risk Factors: Hypertension, Diabetes. Performing Technologist: Blanch Media RVS  Examination Guidelines: A complete evaluation includes B-mode imaging, spectral Doppler, color Doppler, and power Doppler as needed of all accessible portions of each vessel. Bilateral testing is considered an integral part of a complete examination. Limited examinations for reoccurring indications may be performed as noted.  Right Carotid Findings: +----------+--------+--------+--------+------------+--------+           PSV cm/sEDV cm/sStenosisDescribe    Comments +----------+--------+--------+--------+------------+--------+ CCA Prox  71      11              heterogenous          +----------+--------+--------+--------+------------+--------+ CCA Distal120     18              heterogenous         +----------+--------+--------+--------+------------+--------+ ICA Prox  61      12      1-39%   heterogenous         +----------+--------+--------+--------+------------+--------+ ICA Distal65      20                                   +----------+--------+--------+--------+------------+--------+ ECA       86                                           +----------+--------+--------+--------+------------+--------+ +----------+--------+-------+--------+-------------------+           PSV cm/sEDV cmsDescribeArm Pressure (mmHG) +----------+--------+-------+--------+-------------------+ [id number]                                         +----------+--------+-------+--------+-------------------+ +---------+--------+--+--------+-+---------+  VertebralPSV cm/s42EDV cm/s8Antegrade +---------+--------+--+--------+-+---------+  Left Carotid Findings: +----------+--------+--------+--------+------------+--------+           PSV cm/sEDV cm/sStenosisDescribe    Comments +----------+--------+--------+--------+------------+--------+ CCA Prox  73      16              heterogenous         +----------+--------+--------+--------+------------+--------+ CCA Distal53      10              heterogenous         +----------+--------+--------+--------+------------+--------+ ICA Prox  48      14      1-39%   heterogenous         +----------+--------+--------+--------+------------+--------+ ICA Distal59      16                                   +----------+--------+--------+--------+------------+--------+ ECA       87                                           +----------+--------+--------+--------+------------+--------+ +----------+--------+--------+--------+-------------------+ SubclavianPSV cm/sEDV cm/sDescribeArm Pressure (mmHG)  +----------+--------+--------+--------+-------------------+           93                                          +----------+--------+--------+--------+-------------------+ +---------+--------+--+--------+-+---------+ VertebralPSV cm/s22EDV cm/s4Antegrade +---------+--------+--+--------+-+---------+  Summary: Right Carotid: Velocities in the right ICA are consistent with a 1-39% stenosis. Left Carotid: Velocities in the left ICA are consistent with a 1-39% stenosis. Vertebrals: Bilateral vertebral arteries demonstrate antegrade flow. *See table(s) above for measurements and observations.  Electronically signed by Delia Heady MD on 10/04/2018 at 1:29:58 PM.    Final      Scheduled Meds: . apixaban  5 mg Oral BID  . atorvastatin  20 mg Oral Daily  . DULoxetine  30 mg Oral BID  . insulin aspart  0-9 Units Subcutaneous TID WC  . levothyroxine  50 mcg Oral Q0600  . pantoprazole  40 mg Oral BID  . pregabalin  150 mg Oral Daily   Continuous Infusions:   LOS: 2 days   Time Spent in minutes   30 minutes  Patrich Heinze D.O. on 10/05/2018 at 9:50 AM  Between 7am to 7pm - Please see pager noted on [email or web address]  After 7pm go to [email or web address]  And look for the night coverage person covering for me after hours  [facility] Office  [phone]

## 2018-10-06 LAB — GLUCOSE, CAPILLARY
Glucose-Capillary: 260 mg/dL — ABNORMAL HIGH (ref 70–99)
Glucose-Capillary: 314 mg/dL — ABNORMAL HIGH (ref 70–99)
Glucose-Capillary: 279 mg/dL — ABNORMAL HIGH (ref 70–99)
Glucose-Capillary: 349 mg/dL — ABNORMAL HIGH (ref 70–99)

## 2018-10-06 LAB — BASIC METABOLIC PANEL
Anion gap: 10 (ref 5–15)
BUN: 20 mg/dL (ref 8–23)
CO2: 23 mmol/L (ref 22–32)
Calcium: 8.8 mg/dL — ABNORMAL LOW (ref 8.9–10.3)
Chloride: 103 mmol/L (ref 98–111)
Creatinine, Ser: 1.05 mg/dL — ABNORMAL HIGH (ref 0.44–1.00)
GFR calc Af Amer: 58 mL/min — ABNORMAL LOW (ref >60)
GFR calc non Af Amer: 50 mL/min — ABNORMAL LOW (ref >60)
Glucose, Bld: 279 mg/dL — ABNORMAL HIGH (ref 70–99)
Potassium: 4.1 mmol/L (ref 3.5–5.1)
Sodium: 136 mmol/L (ref 135–145)

## 2018-10-06 MED ORDER — INSULIN ASPART 100 UNIT/ML ~~LOC~~ SOLN
5.0000 [IU] | Freq: Once | SUBCUTANEOUS | Status: AC
  Administered 2018-10-06: 22:00:00 5 [IU] via SUBCUTANEOUS

## 2018-10-06 NOTE — Progress Notes (Signed)
Physical Therapy Treatment Patient Details Name: Casey Harrell MRN: [id number] DOB: 04/08/1935 Today's Date: 10/06/2018    History of Present Illness Casey Harrell is a 84 y.o. female with medical history significant for PAF on Eliquis, Hx of CVA, IDT2DM, HTN, HLD, COPD, hypothyroidism, peripheral neuropathy, depression, and OSA who presents to the ED for evaluation of right facial droop. MRI brain and MRA head without contrast were obtained and showed an acute punctate 5 mm infarction in the right paramedian dorsal level of pons with extensive small vessel ischemic changes and old cortical infarcts in the occipital lobes, more extensive on the left than the right.     PT Comments    Pt fatigued before session due to clean up with nursing but tolerated 45' ambulation with RW and then 15' after seated rest break. Worked on seated there ex as well as standing balance. Continue to recommend CIR when medically ready. PT will continue to follow.    Follow Up Recommendations  CIR;Supervision/Assistance - 24 hour     Equipment Recommendations  None recommended by PT    Recommendations for Other Services Rehab consult     Precautions / Restrictions Precautions Precautions: Fall Restrictions Weight Bearing Restrictions: No    Mobility  Bed Mobility Overal bed mobility: Needs Assistance Bed Mobility: Supine to Sit;Sit to Supine     Supine to sit: HOB elevated;Min guard Sit to supine: Min guard   General bed mobility comments: pt able to get LE's into bed and reposition self. Pt able to get to EOB without physical assist, increased time needed  Transfers Overall transfer level: Needs assistance Equipment used: 1 person hand held assist;Rolling walker (2 wheeled) Transfers: Sit to/from Stand Sit to Stand: Min assist;Mod assist         General transfer comment: needed mod A for first stand, pt had difficulty with hip and knee extension due to fatigue. Second time pt was able to stand  with min A and RW  Ambulation/Gait Ambulation/Gait assistance: Min assist;Mod assist Gait Distance (Feet): 45 Feet Assistive device: 1 person hand held assist;Rolling walker (2 wheeled) Gait Pattern/deviations: Step-through pattern;Decreased stride length;Shuffle Gait velocity: Decreased  Gait velocity interpretation: <1.31 ft/sec, indicative of household ambulator General Gait Details: min A with RW, mod with HHA and no AD. Low step ht R side and occasional R lean   Stairs             Wheelchair Mobility    Modified Rankin (Stroke Patients Only) Modified Rankin (Stroke Patients Only) Pre-Morbid Rankin Score: No significant disability Modified Rankin: Moderately severe disability     Balance Overall balance assessment: Needs assistance Sitting-balance support: No upper extremity supported;Feet supported Sitting balance-Leahy Scale: Fair     Standing balance support: Single extremity supported;During functional activity Standing balance-Leahy Scale: Poor Standing balance comment: Reliant on UE and external support during mobility tasks.                             Cognition Arousal/Alertness: Awake/alert Behavior During Therapy: WFL for tasks assessed/performed Overall Cognitive Status: Within Functional Limits for tasks assessed                                 General Comments: pt with appropriate concerns, wanting to look in the mirror to see how asymmetrical her face is       Exercises General Exercises -  Lower Extremity Ankle Circles/Pumps: AROM;Both;10 reps;Seated Long Arc Quad: AROM;Both;10 reps;Seated Hip Flexion/Marching: AROM;Both;10 reps;Seated    General Comments General comments (skin integrity, edema, etc.): worked on standing balance in front of sink mirror. Pt able to let go with one hand but not both      Pertinent Vitals/Pain Pain Assessment: No/denies pain    Home Living                      Prior  Function            PT Goals (current goals can now be found in the care plan section) Acute Rehab PT Goals Patient Stated Goal: to get back to baseline PT Goal Formulation: With patient Time For Goal Achievement: 10/18/18 Potential to Achieve Goals: Good Progress towards PT goals: Progressing toward goals    Frequency    Min 4X/week      PT Plan Current plan remains appropriate    Co-evaluation              AM-PAC PT "6 Clicks" Mobility   Outcome Measure  Help needed turning from your back to your side while in a flat bed without using bedrails?: A Little Help needed moving from lying on your back to sitting on the side of a flat bed without using bedrails?: A Little Help needed moving to and from a bed to a chair (including a wheelchair)?: A Lot Help needed standing up from a chair using your arms (e.g., wheelchair or bedside chair)?: A Little Help needed to walk in hospital room?: A Lot Help needed climbing 3-5 steps with a railing? : A Lot 6 Click Score: 15    End of Session Equipment Utilized During Treatment: Gait belt Activity Tolerance: Patient tolerated treatment well Patient left: with call bell/phone within reach;in bed;with bed alarm set Nurse Communication: Mobility status PT Visit Diagnosis: Unsteadiness on feet (R26.81);Muscle weakness (generalized) (M62.81)     Time: 7829-5621 PT Time Calculation (min) (ACUTE ONLY): 23 min  Charges:  $Gait Training: 8-22 mins $Therapeutic Activity: 8-22 mins                     Lyanne Co, PT  Acute Rehab Services  Pager [phone] Office [phone]    Lawana Chambers [place] 10/06/2018, 3:55 PM

## 2018-10-06 NOTE — Progress Notes (Signed)
PROGRESS NOTE    Casey Harrell  WGN:[id number] DOB: 06/24/1934 DOA: 10/03/2018 PCP: [name], MD   Brief Narrative:  HPI On 10/03/2018 by Dr. Darreld Mclean Casey Harrell is a 84 y.o. female with medical history significant for PAF on Eliquis, Hx of CVA, IDT2DM, HTN, HLD, COPD, hypothyroidism, peripheral neuropathy, depression, and OSA who presents to the ED for evaluation of right facial droop.  Patient states she was in her usual state of health until around 3 PM on 10/03/2018.  She was sitting down on her couch when her son noticed that she had drooping of her right face and subsequently called EMS.  She had a headache at that time.  Patient denies any associated change in vision, slurred speech, dysphagia, chest pain, palpitations, dyspnea, abdominal pain, nausea, vomiting, diarrhea, dysuria, or focal weakness.  She says she normally ambulates with a cane but has not tried to walk since onset of her symptoms.  Interim history Admitted with acute CVA. Pending CIR.   Assessment & Plan   Acute CVA -Presented with right sided facial droop -CT head: No acute intracranial infarct -MRI head: Acute punctate 5 mm infarction in the right paramedian dorsal level or pons -MRA does not show any acute large or medium vessel occlusion -LDL 57, Hemoglobin A1c 9.6 -Carotid Doppler shows bilateral ICA 1 to 39% stenosis.  Bilateral vertebral arteries demonstrate antegrade flow -Echocardiogram pending -Neurology consulted and appreciated, Discussed with [name]- Aspirin discontinued, given patient's age and increased risk of bleeding with Eliquis and aspirin.  -PT OT recommending inpatient rehab -Patient rehab consulted- pending   Paroxysmal atrial fibrillation: -CHA2DS2-VASc Score is at least 7. -Continue Eliquis -Coreg held -Currently in sinus rhythm   Diabetes mellitus, type II -Hemoglobin A1c 9.6 -Continue Levemir, insulin sliding scale, CBG monitoring (see of note dose of Levemir  decreased while hospitalized)  Hypertension -Given Acute CVA, allow for permissive HTN -Home meds: Amlodipine, Coreg, irbesartan currently held  Hyperlipidemia -Continue statin  COPD -Stable, no wheezing on exam -Continue nebs PRN  Hypothyroidism -Continue Synthroid  GERD -Continue Protonix  Depression/anxiety -Continue duloxetine  Obstructive sleep apnea -Has been intolerant to CPAP.  Continue supplemental oxygen.  DVT Prophylaxis Eliquis  Code Status: DNR  Family Communication: None at bedside  Disposition Plan: Admitted.  Pending inpatient rehab consultation   Consultants Neurology Inpatient rehab  Procedures  Carotid Doppler Echocardiogram  Antibiotics   Anti-infectives (From admission, onward)   None      Subjective:   Casey Harrell seen and examined today.  With no complaints this morning.  Denies chest pain, shortness of breath, abdominal pain, nausea vomiting, diarrhea constipation, dizziness or headache. Objective:   Vitals:   10/05/18 1958 10/05/18 2352 10/06/18 0346 10/06/18 0751  BP: (!) 141/72 (!) 141/76 (!) 122/52 (!) 153/56  Pulse: 64 65 80 62  Resp: 13 15 15 17   Temp: 98.2 F (36.8 C) 98.3 F (36.8 C) 97.9 F (36.6 C) 98 F (36.7 C)  TempSrc: Oral Oral Oral Axillary  SpO2: 97% 94% 95% 95%  Weight:      Height:        Intake/Output Summary (Last 24 hours) at 10/06/2018 1148 Last data filed at 10/06/2018 0900 Gross per 24 hour  Intake 480 ml  Output -  Net 480 ml   Filed Weights   10/03/18 2132 10/03/18 2133  Weight: 61.7 kg 56.7 kg   Exam  General: Well developed, well nourished, NAD, appears stated age  HEENT: NCAT, mucous  membranes moist.   Cardiovascular: S1 S2 auscultated, RRR  Respiratory: Clear to auscultation bilaterally with equal chest rise  Abdomen: Soft, nontender, nondistended, + bowel sounds  Extremities: warm dry without cyanosis clubbing or edema  Neuro: AAOx3, right-sided weakness.  Right-sided  facial droop.  Unable to look to the right, left gaze nystagmus  Psych: Pleasant, appropriate mood and affect  Data Reviewed: I have personally reviewed following labs and imaging studies  CBC: Recent Labs  Lab 10/03/18 2017 10/03/18 2021 10/04/18 0432  WBC 10.6*  --  10.5  NEUTROABS 4.9  --   --   HGB 13.1 13.9 12.9  HCT 39.9 41.0 39.0  MCV 90.7  --  88.4  PLT 267  --  259   Basic Metabolic Panel: Recent Labs  Lab 10/03/18 2017 10/03/18 2021 10/04/18 0432 10/05/18 0353 10/06/18 0437  NA 136 137 140 137 136  K 3.5 3.5 3.4* 3.9 4.1  CL 104 103 103 103 103  CO2 22  --  24 24 23   GLUCOSE 133* 131* 97 261* 279*  BUN 20 22 17 20 20   CREATININE 1.05* 1.00 0.88 1.24* 1.05*  CALCIUM 9.6  --  9.6 8.9 8.8*   GFR: Estimated Creatinine Clearance: 31.7 mL/min (A) (by C-G formula based on SCr of 1.05 mg/dL (H)). Liver Function Tests: Recent Labs  Lab 10/03/18 2017  AST 15  ALT 12  ALKPHOS 96  BILITOT 0.5  PROT 7.0  ALBUMIN 3.7   No results for input(s): LIPASE, AMYLASE in the last 168 hours. No results for input(s): AMMONIA in the last 168 hours. Coagulation Profile: Recent Labs  Lab 10/03/18 2017  INR 1.4*   Cardiac Enzymes: No results for input(s): CKTOTAL, CKMB, CKMBINDEX, TROPONINI in the last 168 hours. BNP (last 3 results) No results for input(s): PROBNP in the last 8760 hours. HbA1C: Recent Labs    10/04/18 0432  HGBA1C 9.6*   CBG: Recent Labs  Lab 10/05/18 1136 10/05/18 1632 10/05/18 2057 10/06/18 0553 10/06/18 1144  GLUCAP 236* 296* 260* 260* 314*   Lipid Profile: Recent Labs    10/04/18 0432  CHOL 101  HDL 24*  LDLCALC 57  TRIG 99  CHOLHDL 4.2   Thyroid Function Tests: No results for input(s): TSH, T4TOTAL, FREET4, T3FREE, THYROIDAB in the last 72 hours. Anemia Panel: No results for input(s): VITAMINB12, FOLATE, FERRITIN, TIBC, IRON, RETICCTPCT in the last 72 hours. Urine analysis:    Component Value Date/Time   COLORURINE  YELLOW 04/04/2017 1104   APPEARANCEUR HAZY (A) 04/04/2017 1104   LABSPEC 1.012 04/04/2017 1104   PHURINE 6.0 04/04/2017 1104   GLUCOSEU 50 (A) 04/04/2017 1104   HGBUR NEGATIVE 04/04/2017 1104   BILIRUBINUR neg 10/15/2017 1527   KETONESUR NEGATIVE 04/04/2017 1104   PROTEINUR Positive (A) 10/15/2017 1527   PROTEINUR NEGATIVE 04/04/2017 1104   UROBILINOGEN 0.2 10/15/2017 1527   UROBILINOGEN 0.2 11/05/2014 1555   NITRITE neg 10/15/2017 1527   NITRITE NEGATIVE 04/04/2017 1104   LEUKOCYTESUR Negative 10/15/2017 1527   Sepsis Labs: @LABRCNTIP (procalcitonin:4,lacticidven:4)  ) Recent Results (from the past 240 hour(s))  SARS Coronavirus 2 (CEPHEID - Performed in [facility] hospital lab), Hosp Order     Status: None   Collection Time: 10/03/18  9:53 PM  Result Value Ref Range Status   SARS Coronavirus 2 NEGATIVE NEGATIVE Final    Comment: (NOTE) If result is NEGATIVE SARS-CoV-2 target nucleic acids are NOT DETECTED. The SARS-CoV-2 RNA is generally detectable in upper and lower  respiratory  specimens during the acute phase of infection. The lowest  concentration of SARS-CoV-2 viral copies this assay can detect is 250  copies / mL. A negative result does not preclude SARS-CoV-2 infection  and should not be used as the sole basis for treatment or other  patient management decisions.  A negative result may occur with  improper specimen collection / handling, submission of specimen other  than nasopharyngeal swab, presence of viral mutation(s) within the  areas targeted by this assay, and inadequate number of viral copies  (<250 copies / mL). A negative result must be combined with clinical  observations, patient history, and epidemiological information. If result is POSITIVE SARS-CoV-2 target nucleic acids are DETECTED. The SARS-CoV-2 RNA is generally detectable in upper and lower  respiratory specimens dur ing the acute phase of infection.  Positive  results are indicative of active  infection with SARS-CoV-2.  Clinical  correlation with patient history and other diagnostic information is  necessary to determine patient infection status.  Positive results do  not rule out bacterial infection or co-infection with other viruses. If result is PRESUMPTIVE POSTIVE SARS-CoV-2 nucleic acids MAY BE PRESENT.   A presumptive positive result was obtained on the submitted specimen  and confirmed on repeat testing.  While 2019 novel coronavirus  (SARS-CoV-2) nucleic acids may be present in the submitted sample  additional confirmatory testing may be necessary for epidemiological  and / or clinical management purposes  to differentiate between  SARS-CoV-2 and other Sarbecovirus currently known to infect humans.  If clinically indicated additional testing with an alternate test  methodology [phone]) is advised. The SARS-CoV-2 RNA is generally  detectable in upper and lower respiratory sp ecimens during the acute  phase of infection. The expected result is Negative. Fact Sheet for Patients:  BoilerBrush.com.cy Fact Sheet for Healthcare Providers: https://pope.com/ This test is not yet approved or cleared by the [place] FDA and has been authorized for detection and/or diagnosis of SARS-CoV-2 by FDA under an Emergency Use Authorization (EUA).  This EUA will remain in effect (meaning this test can be used) for the duration of the COVID-19 declaration under Section 564(b)(1) of the Act, 21 U.S.C. section 360bbb-3(b)(1), unless the authorization is terminated or revoked sooner. Performed at [facility], [address] [place], [place] [address]       Radiology Studies: No results found.   Scheduled Meds: . apixaban  5 mg Oral BID  . atorvastatin  20 mg Oral Daily  . DULoxetine  30 mg Oral BID  . insulin aspart  0-9 Units Subcutaneous TID WC  . levothyroxine  50 mcg Oral Q0600  . pantoprazole  40 mg Oral BID  .  pregabalin  150 mg Oral Daily   Continuous Infusions:   LOS: 3 days   Time Spent in minutes   30 minutes  Casey Harrell D.O. on 10/06/2018 at 11:48 AM  Between 7am to 7pm - Please see pager noted on [email or web address]  After 7pm go to [email or web address]  And look for the night coverage person covering for me after hours  [facility] Office  [phone]

## 2018-10-07 LAB — BASIC METABOLIC PANEL
Anion gap: 9 (ref 5–15)
BUN: 21 mg/dL (ref 8–23)
CO2: 24 mmol/L (ref 22–32)
Calcium: 9.3 mg/dL (ref 8.9–10.3)
Chloride: 102 mmol/L (ref 98–111)
Creatinine, Ser: 0.97 mg/dL (ref 0.44–1.00)
GFR calc Af Amer: >60 mL/min (ref >60)
GFR calc non Af Amer: 55 mL/min — ABNORMAL LOW (ref >60)
Glucose, Bld: 222 mg/dL — ABNORMAL HIGH (ref 70–99)
Potassium: 3.8 mmol/L (ref 3.5–5.1)
Sodium: 135 mmol/L (ref 135–145)

## 2018-10-07 LAB — HEMOGLOBIN AND HEMATOCRIT, BLOOD
HCT: 39 % (ref 36.0–46.0)
Hemoglobin: 12.9 g/dL (ref 12.0–15.0)

## 2018-10-07 LAB — GLUCOSE, CAPILLARY
Glucose-Capillary: 255 mg/dL — ABNORMAL HIGH (ref 70–99)
Glucose-Capillary: 227 mg/dL — ABNORMAL HIGH (ref 70–99)
Glucose-Capillary: 248 mg/dL — ABNORMAL HIGH (ref 70–99)
Glucose-Capillary: 395 mg/dL — ABNORMAL HIGH (ref 70–99)

## 2018-10-07 MED ORDER — INSULIN DETEMIR 100 UNIT/ML ~~LOC~~ SOLN
10.0000 [IU] | Freq: Every day | SUBCUTANEOUS | Status: DC
  Administered 2018-10-07: 10 [IU] via SUBCUTANEOUS
  Filled 2018-10-07 (×2): qty 0.1

## 2018-10-07 MED ORDER — INSULIN ASPART 100 UNIT/ML ~~LOC~~ SOLN
6.0000 [IU] | Freq: Once | SUBCUTANEOUS | Status: AC
  Administered 2018-10-07: 6 [IU] via SUBCUTANEOUS

## 2018-10-07 MED ORDER — DIPHENHYDRAMINE HCL 12.5 MG/5ML PO ELIX
12.5000 mg | ORAL_SOLUTION | Freq: Once | ORAL | Status: AC
  Administered 2018-10-08: 12.5 mg via ORAL
  Filled 2018-10-07: qty 10

## 2018-10-07 MED ORDER — INSULIN ASPART 100 UNIT/ML ~~LOC~~ SOLN
3.0000 [IU] | Freq: Three times a day (TID) | SUBCUTANEOUS | Status: DC
  Administered 2018-10-07 – 2018-10-08 (×3): 3 [IU] via SUBCUTANEOUS

## 2018-10-07 MED ORDER — FLUTICASONE PROPIONATE 50 MCG/ACT NA SUSP
1.0000 | Freq: Every day | NASAL | Status: DC
  Administered 2018-10-07 – 2018-10-08 (×2): 1 via NASAL
  Filled 2018-10-07: qty 16

## 2018-10-07 NOTE — Progress Notes (Signed)
PROGRESS NOTE    Casey Harrell  ZOX:[id number] DOB: [birth year] DOA: 10/03/2018 PCP: [name], MD   Brief Narrative:  HPI On 10/03/2018 by Dr. Darreld Mclean Casey Harrell is a 84 y.o. female with medical history significant for PAF on Eliquis, Hx of CVA, IDT2DM, HTN, HLD, COPD, hypothyroidism, peripheral neuropathy, depression, and OSA who presents to the ED for evaluation of right facial droop.  Patient states she was in her usual state of health until around 3 PM on 10/03/2018.  She was sitting down on her couch when her son noticed that she had drooping of her right face and subsequently called EMS.  She had a headache at that time.  Patient denies any associated change in vision, slurred speech, dysphagia, chest pain, palpitations, dyspnea, abdominal pain, nausea, vomiting, diarrhea, dysuria, or focal weakness.  She says she normally ambulates with a cane but has not tried to walk since onset of her symptoms.  Interim history Admitted with acute CVA. Pending CIR.   Assessment & Plan   Acute CVA -Presented with right sided facial droop -CT head: No acute intracranial infarct -MRI head: Acute punctate 5 mm infarction in the right paramedian dorsal level or pons -MRA does not show any acute large or medium vessel occlusion -LDL 57, Hemoglobin A1c 9.6 -Carotid Doppler shows bilateral ICA 1 to 39% stenosis.  Bilateral vertebral arteries demonstrate antegrade flow -Echocardiogram pending -Neurology consulted and appreciated, Discussed with [name]- Aspirin discontinued, given patient's age and increased risk of bleeding with Eliquis and aspirin.  -PT OT recommending inpatient rehab -Patient rehab consulted- pending consult and eval -patient did slip out of bed on 10/06/2018- states she did not hit her head, not sure how she slipped out -continue to monitor- currently no new neurological deficits  Paroxysmal atrial fibrillation: -CHA2DS2-VASc Score is at least 7. -Continue  Eliquis -Coreg held -Currently in sinus rhythm   Diabetes mellitus, type II -Hemoglobin A1c 9.6 -Continue Levemir, insulin sliding scale, CBG monitoring (see of note dose of Levemir decreased while hospitalized)  Hypertension -Given Acute CVA, allow for permissive HTN -BP has been stable -Home meds: Amlodipine, Coreg, irbesartan currently held  Hyperlipidemia -Continue statin  COPD -Stable, no wheezing on exam -Continue nebs PRN  Hypothyroidism -Continue Synthroid  GERD -Continue Protonix  Depression/anxiety -Continue duloxetine  Obstructive sleep apnea -Has been intolerant to CPAP.  Continue supplemental oxygen.  Chronic kidney disease, stage III -Creatitinine currently 0.97 (baseline 0.9-1.1) -stable, continue to monitor BMP  DVT Prophylaxis Eliquis  Code Status: DNR  Family Communication: None at bedside  Disposition Plan: Admitted.  Pending inpatient rehab consultation   Consultants Neurology Inpatient rehab  Procedures  Carotid Doppler Echocardiogram  Antibiotics   Anti-infectives (From admission, onward)   None      Subjective:   Casey Harrell seen and examined today.  Patient with no complaints this morning.  Denies chest pain, shortness breath, abdominal pain, nausea or vomiting, diarrhea constipation, dizziness or headache. Objective:   Vitals:   10/06/18 2047 10/06/18 2324 10/07/18 0312 10/07/18 0836  BP: 123/75 (!) 144/60 (!) 150/62 133/72  Pulse: 75 65 (!) 59 73  Resp: 15 15 15 17   Temp: 98.4 F (36.9 C) 99.9 F (37.7 C) 97.7 F (36.5 C) 98 F (36.7 C)  TempSrc: Oral Oral Oral Oral  SpO2: 94% 92% 92% 97%  Weight:      Height:        Intake/Output Summary (Last 24 hours) at 10/07/2018 1104 Last data filed  at 10/07/2018 0849 Gross per 24 hour  Intake 960 ml  Output -  Net 960 ml   Filed Weights   10/03/18 2132 10/03/18 2133  Weight: 61.7 kg 56.7 kg   Exam  General: Well developed, well nourished, NAD, appears stated  age  HEENT: NCAT,  mucous membranes moist.  Cardiovascular: S1 S2 auscultated, RRR  Respiratory: Clear to auscultation bilaterally with equal chest rise  Abdomen: Soft, nontender, nondistended, + bowel sounds  Extremities: warm dry without cyanosis clubbing or edema  Neuro: AAOx3, right sided weakness as well as facial weakness with droop. Unable to look to the right, nystagmus with leftward gaze  Psych: Pleasant, appropriate mood and affect   Data Reviewed: I have personally reviewed following labs and imaging studies  CBC: Recent Labs  Lab 10/03/18 2017 10/03/18 2021 10/04/18 0432 10/07/18 0753  WBC 10.6*  --  10.5  --   NEUTROABS 4.9  --   --   --   HGB 13.1 13.9 12.9 12.9  HCT 39.9 41.0 39.0 39.0  MCV 90.7  --  88.4  --   PLT 267  --  259  --    Basic Metabolic Panel: Recent Labs  Lab 10/03/18 2017 10/03/18 2021 10/04/18 0432 10/05/18 0353 10/06/18 0437 10/07/18 0753  NA 136 137 140 137 136 135  K 3.5 3.5 3.4* 3.9 4.1 3.8  CL 104 103 103 103 103 102  CO2 22  --  24 24 23 24   GLUCOSE 133* 131* 97 261* 279* 222*  BUN 20 22 17 20 20 21   CREATININE 1.05* 1.00 0.88 1.24* 1.05* 0.97  CALCIUM 9.6  --  9.6 8.9 8.8* 9.3   GFR: Estimated Creatinine Clearance: 34.3 mL/min (by C-G formula based on SCr of 0.97 mg/dL). Liver Function Tests: Recent Labs  Lab 10/03/18 2017  AST 15  ALT 12  ALKPHOS 96  BILITOT 0.5  PROT 7.0  ALBUMIN 3.7   No results for input(s): LIPASE, AMYLASE in the last 168 hours. No results for input(s): AMMONIA in the last 168 hours. Coagulation Profile: Recent Labs  Lab 10/03/18 2017  INR 1.4*   Cardiac Enzymes: No results for input(s): CKTOTAL, CKMB, CKMBINDEX, TROPONINI in the last 168 hours. BNP (last 3 results) No results for input(s): PROBNP in the last 8760 hours. HbA1C: No results for input(s): HGBA1C in the last 72 hours. CBG: Recent Labs  Lab 10/06/18 0553 10/06/18 1144 10/06/18 1614 10/06/18 2111 10/07/18 0605   GLUCAP 260* 314* 279* 349* 255*   Lipid Profile: No results for input(s): CHOL, HDL, LDLCALC, TRIG, CHOLHDL, LDLDIRECT in the last 72 hours. Thyroid Function Tests: No results for input(s): TSH, T4TOTAL, FREET4, T3FREE, THYROIDAB in the last 72 hours. Anemia Panel: No results for input(s): VITAMINB12, FOLATE, FERRITIN, TIBC, IRON, RETICCTPCT in the last 72 hours. Urine analysis:    Component Value Date/Time   COLORURINE YELLOW 04/04/2017 1104   APPEARANCEUR HAZY (A) 04/04/2017 1104   LABSPEC 1.012 04/04/2017 1104   PHURINE 6.0 04/04/2017 1104   GLUCOSEU 50 (A) 04/04/2017 1104   HGBUR NEGATIVE 04/04/2017 1104   BILIRUBINUR neg 10/15/2017 1527   KETONESUR NEGATIVE 04/04/2017 1104   PROTEINUR Positive (A) 10/15/2017 1527   PROTEINUR NEGATIVE 04/04/2017 1104   UROBILINOGEN 0.2 10/15/2017 1527   UROBILINOGEN 0.2 11/05/2014 1555   NITRITE neg 10/15/2017 1527   NITRITE NEGATIVE 04/04/2017 1104   LEUKOCYTESUR Negative 10/15/2017 1527   Sepsis Labs: @LABRCNTIP (procalcitonin:4,lacticidven:4)  ) Recent Results (from the past 240 hour(s))  SARS Coronavirus 2 (CEPHEID - Performed in [facility] hospital lab), Hosp Order     Status: None   Collection Time: 10/03/18  9:53 PM  Result Value Ref Range Status   SARS Coronavirus 2 NEGATIVE NEGATIVE Final    Comment: (NOTE) If result is NEGATIVE SARS-CoV-2 target nucleic acids are NOT DETECTED. The SARS-CoV-2 RNA is generally detectable in upper and lower  respiratory specimens during the acute phase of infection. The lowest  concentration of SARS-CoV-2 viral copies this assay can detect is 250  copies / mL. A negative result does not preclude SARS-CoV-2 infection  and should not be used as the sole basis for treatment or other  patient management decisions.  A negative result may occur with  improper specimen collection / handling, submission of specimen other  than nasopharyngeal swab, presence of viral mutation(s) within the  areas  targeted by this assay, and inadequate number of viral copies  (<250 copies / mL). A negative result must be combined with clinical  observations, patient history, and epidemiological information. If result is POSITIVE SARS-CoV-2 target nucleic acids are DETECTED. The SARS-CoV-2 RNA is generally detectable in upper and lower  respiratory specimens dur ing the acute phase of infection.  Positive  results are indicative of active infection with SARS-CoV-2.  Clinical  correlation with patient history and other diagnostic information is  necessary to determine patient infection status.  Positive results do  not rule out bacterial infection or co-infection with other viruses. If result is PRESUMPTIVE POSTIVE SARS-CoV-2 nucleic acids MAY BE PRESENT.   A presumptive positive result was obtained on the submitted specimen  and confirmed on repeat testing.  While 2019 novel coronavirus  (SARS-CoV-2) nucleic acids may be present in the submitted sample  additional confirmatory testing may be necessary for epidemiological  and / or clinical management purposes  to differentiate between  SARS-CoV-2 and other Sarbecovirus currently known to infect humans.  If clinically indicated additional testing with an alternate test  methodology [id number]) is advised. The SARS-CoV-2 RNA is generally  detectable in upper and lower respiratory sp ecimens during the acute  phase of infection. The expected result is Negative. Fact Sheet for Patients:  BoilerBrush.com.cy Fact Sheet for Healthcare Providers: https://pope.com/ This test is not yet approved or cleared by the [place] FDA and has been authorized for detection and/or diagnosis of SARS-CoV-2 by FDA under an Emergency Use Authorization (EUA).  This EUA will remain in effect (meaning this test can be used) for the duration of the COVID-19 declaration under Section 564(b)(1) of the Act, 21 U.S.C. section  360bbb-3(b)(1), unless the authorization is terminated or revoked sooner. Performed at [facility], [address] [place], [place] [address]       Radiology Studies: No results found.   Scheduled Meds: . apixaban  5 mg Oral BID  . atorvastatin  20 mg Oral Daily  . DULoxetine  30 mg Oral BID  . insulin aspart  0-9 Units Subcutaneous TID WC  . levothyroxine  50 mcg Oral Q0600  . pantoprazole  40 mg Oral BID  . pregabalin  150 mg Oral Daily   Continuous Infusions:   LOS: 4 days   Time Spent in minutes   30 minutes  Jeter Tomey D.O. on 10/07/2018 at 11:04 AM  Between 7am to 7pm - Please see pager noted on [email or web address]  After 7pm go to [email or web address]  And look for the night coverage person covering for me after hours  [facility] Office  [phone]

## 2018-10-07 NOTE — Progress Notes (Signed)
Inpatient Rehabilitation Admissions Coordinator  Inpatient Rehab Consult received. I met with patient  at the bedside for rehabilitation assessment. We discussed goals and expectations of an inpatient rehab admission.  Pt previously at [facility]. She lives with her son and would be home alone 7 am until 3:30 daily. I contacted her son, Darnell Level, by phone and we discussed goals and expectations of an inpt rehab admit. He prefers inpt rehab . I will begin insurance authorization with [facility] for a possible admit pending their approval.  Danne Baxter, RN, MSN Rehab Admissions Coordinator [phone] 10/07/2018 11:42 AM

## 2018-10-07 NOTE — Care Management Important Message (Signed)
Important Message  Patient Details  Name: Casey Harrell MRN: [id number] Date of Birth: 19-Sep-1934   Medicare Important Message Given:  Yes    Yusuf Yu Stefan Church 10/07/2018, 3:42 PM

## 2018-10-07 NOTE — Progress Notes (Signed)
Inpatient Diabetes Program Recommendations  AACE/ADA: New Consensus Statement on Inpatient Glycemic Control (2015)  Target Ranges:  Prepandial:   less than 140 mg/dL      Peak postprandial:   less than 180 mg/dL (1-2 hours)      Critically ill patients:  140 - 180 mg/dL   Lab Results  Component Value Date   GLUCAP 227 (H) 10/07/2018   HGBA1C 9.6 (H) 10/04/2018    Review of Glycemic Control Results for Casey Harrell, Casey Harrell (MRN [id number]) as of 10/07/2018 14:21  Ref. Range 10/06/2018 11:44 10/06/2018 16:14 10/06/2018 21:11 10/07/2018 06:05 10/07/2018 12:23  Glucose-Capillary Latest Ref Range: 70 - 99 mg/dL 387 (H) 564 (H) 332 (H) 255 (H) 227 (H)    Inpatient Diabetes Program Recommendations:  -Levemir 10 units daily -Novolog 3 units tid meal coverage if eats 50%  Thank you, Darel Hong E. Tymir Terral, RN, MSN, CDE  Diabetes Coordinator Inpatient Glycemic Control Team Team Pager [phone] (8am-5pm) 10/07/2018 2:23 PM

## 2018-10-07 NOTE — Progress Notes (Signed)
Physical Therapy Treatment Patient Details Name: Casey Harrell MRN: [id number] DOB: 04-Jan-1935 Today's Date: 10/07/2018    History of Present Illness Casey Harrell is a 84 y.o. female with medical history significant for PAF on Eliquis, Hx of CVA, IDT2DM, HTN, HLD, COPD, hypothyroidism, peripheral neuropathy, depression, and OSA who presents to the ED for evaluation of right facial droop. MRI brain and MRA head without contrast were obtained and showed an acute punctate 5 mm infarction in the right paramedian dorsal level of pons with extensive small vessel ischemic changes and old cortical infarcts in the occipital lobes, more extensive on the left than the right.     PT Comments    Pt progressing towards goals. Able to increase ambulation distance this session, however, remains unsteady. Required min to mod A for steadying assist. Educated about supine HEP as well. Current recommendations appropriate. Will continue to follow acutely to maximize functional mobility independence and safety.    Follow Up Recommendations  CIR;Supervision/Assistance - 24 hour     Equipment Recommendations  None recommended by PT    Recommendations for Other Services Rehab consult     Precautions / Restrictions Precautions Precautions: Fall Restrictions Weight Bearing Restrictions: No    Mobility  Bed Mobility Overal bed mobility: Needs Assistance Bed Mobility: Sit to Supine       Sit to supine: Supervision   General bed mobility comments: Supervision for safety for return to supine   Transfers Overall transfer level: Needs assistance Equipment used: 1 person hand held assist Transfers: Sit to/from Stand Sit to Stand: Min assist         General transfer comment: Min A for lift assist and steadying assist.   Ambulation/Gait Ambulation/Gait assistance: Min assist;Mod assist Gait Distance (Feet): 75 Feet Assistive device: 1 person hand held assist;Rolling walker (2 wheeled) Gait  Pattern/deviations: Step-through pattern;Decreased stride length;Shuffle Gait velocity: Decreased    General Gait Details: Min to mod a for steadying assist with HHA. Cues for increasing step height and length in RLE as pt tended to have shuffle type pattern in RLE.    Stairs             Wheelchair Mobility    Modified Rankin (Stroke Patients Only) Modified Rankin (Stroke Patients Only) Pre-Morbid Rankin Score: No significant disability Modified Rankin: Moderately severe disability     Balance Overall balance assessment: Needs assistance Sitting-balance support: No upper extremity supported;Feet supported Sitting balance-Leahy Scale: Fair     Standing balance support: Single extremity supported;During functional activity Standing balance-Leahy Scale: Poor Standing balance comment: Reliant on UE and external support during mobility tasks.                             Cognition Arousal/Alertness: Awake/alert Behavior During Therapy: WFL for tasks assessed/performed Overall Cognitive Status: Within Functional Limits for tasks assessed                                        Exercises General Exercises - Lower Extremity Ankle Circles/Pumps: AROM;Both;20 reps;Supine Heel Slides: AROM;Both;10 reps;Supine    General Comments        Pertinent Vitals/Pain Pain Assessment: No/denies pain    Home Living                      Prior Function  PT Goals (current goals can now be found in the care plan section) Acute Rehab PT Goals Patient Stated Goal: to get back to baseline PT Goal Formulation: With patient Time For Goal Achievement: 10/18/18 Potential to Achieve Goals: Good Progress towards PT goals: Progressing toward goals    Frequency    Min 4X/week      PT Plan Current plan remains appropriate    Co-evaluation              AM-PAC PT "6 Clicks" Mobility   Outcome Measure  Help needed turning from  your back to your side while in a flat bed without using bedrails?: A Little Help needed moving from lying on your back to sitting on the side of a flat bed without using bedrails?: A Little Help needed moving to and from a bed to a chair (including a wheelchair)?: A Lot Help needed standing up from a chair using your arms (e.g., wheelchair or bedside chair)?: A Little Help needed to walk in hospital room?: A Lot Help needed climbing 3-5 steps with a railing? : A Lot 6 Click Score: 15    End of Session Equipment Utilized During Treatment: Gait belt Activity Tolerance: Patient tolerated treatment well Patient left: in bed;with call bell/phone within reach;with bed alarm set Nurse Communication: Mobility status PT Visit Diagnosis: Unsteadiness on feet (R26.81);Muscle weakness (generalized) (M62.81)     Time: 1027-2536 PT Time Calculation (min) (ACUTE ONLY): 15 min  Charges:  $Gait Training: 8-22 mins                     Gladys Damme, PT, DPT  Acute Rehabilitation Services  Pager: [phone] Office: [phone]    Lehman Prom 10/07/2018, 2:39 PM

## 2018-10-08 ENCOUNTER — Other Ambulatory Visit: Payer: Self-pay

## 2018-10-08 ENCOUNTER — Encounter (HOSPITAL_COMMUNITY): Payer: Self-pay

## 2018-10-08 ENCOUNTER — Inpatient Hospital Stay (HOSPITAL_COMMUNITY): Payer: Medicare Other

## 2018-10-08 ENCOUNTER — Inpatient Hospital Stay (HOSPITAL_COMMUNITY)
Admission: RE | Admit: 2018-10-08 | Discharge: 2018-10-21 | DRG: 057 | Disposition: A | Discharge status: Home Health | Payer: Medicare Other | Source: Intra-hospital | Attending: Physical Medicine & Rehabilitation | Admitting: Physical Medicine & Rehabilitation

## 2018-10-08 DIAGNOSIS — E119 Type 2 diabetes mellitus without complications: Secondary | ICD-10-CM

## 2018-10-08 DIAGNOSIS — Z833 Family history of diabetes mellitus: Secondary | ICD-10-CM | POA: Diagnosis not present

## 2018-10-08 DIAGNOSIS — Z794 Long term (current) use of insulin: Secondary | ICD-10-CM

## 2018-10-08 DIAGNOSIS — E039 Hypothyroidism, unspecified: Secondary | ICD-10-CM | POA: Diagnosis present

## 2018-10-08 DIAGNOSIS — Z8249 Family history of ischemic heart disease and other diseases of the circulatory system: Secondary | ICD-10-CM | POA: Diagnosis not present

## 2018-10-08 DIAGNOSIS — R7309 Other abnormal glucose: Secondary | ICD-10-CM | POA: Diagnosis not present

## 2018-10-08 DIAGNOSIS — E1151 Type 2 diabetes mellitus with diabetic peripheral angiopathy without gangrene: Secondary | ICD-10-CM | POA: Diagnosis not present

## 2018-10-08 DIAGNOSIS — Z823 Family history of stroke: Secondary | ICD-10-CM | POA: Diagnosis not present

## 2018-10-08 DIAGNOSIS — I69392 Facial weakness following cerebral infarction: Secondary | ICD-10-CM

## 2018-10-08 DIAGNOSIS — J449 Chronic obstructive pulmonary disease, unspecified: Secondary | ICD-10-CM | POA: Diagnosis not present

## 2018-10-08 DIAGNOSIS — I69351 Hemiplegia and hemiparesis following cerebral infarction affecting right dominant side: Secondary | ICD-10-CM | POA: Diagnosis not present

## 2018-10-08 DIAGNOSIS — I69328 Other speech and language deficits following cerebral infarction: Secondary | ICD-10-CM

## 2018-10-08 DIAGNOSIS — R0989 Other specified symptoms and signs involving the circulatory and respiratory systems: Secondary | ICD-10-CM | POA: Diagnosis not present

## 2018-10-08 DIAGNOSIS — I6350 Cerebral infarction due to unspecified occlusion or stenosis of unspecified cerebral artery: Secondary | ICD-10-CM | POA: Diagnosis present

## 2018-10-08 DIAGNOSIS — E1142 Type 2 diabetes mellitus with diabetic polyneuropathy: Secondary | ICD-10-CM | POA: Diagnosis not present

## 2018-10-08 DIAGNOSIS — N179 Acute kidney failure, unspecified: Secondary | ICD-10-CM | POA: Diagnosis not present

## 2018-10-08 DIAGNOSIS — Z7901 Long term (current) use of anticoagulants: Secondary | ICD-10-CM

## 2018-10-08 DIAGNOSIS — K219 Gastro-esophageal reflux disease without esophagitis: Secondary | ICD-10-CM | POA: Diagnosis not present

## 2018-10-08 DIAGNOSIS — I10 Essential (primary) hypertension: Secondary | ICD-10-CM | POA: Diagnosis not present

## 2018-10-08 DIAGNOSIS — Z7982 Long term (current) use of aspirin: Secondary | ICD-10-CM | POA: Diagnosis not present

## 2018-10-08 DIAGNOSIS — I480 Paroxysmal atrial fibrillation: Secondary | ICD-10-CM | POA: Diagnosis not present

## 2018-10-08 DIAGNOSIS — E785 Hyperlipidemia, unspecified: Secondary | ICD-10-CM | POA: Diagnosis not present

## 2018-10-08 DIAGNOSIS — S0990XA Unspecified injury of head, initial encounter: Secondary | ICD-10-CM | POA: Diagnosis not present

## 2018-10-08 LAB — BASIC METABOLIC PANEL
Anion gap: 10 (ref 5–15)
BUN: 20 mg/dL (ref 8–23)
CO2: 26 mmol/L (ref 22–32)
Calcium: 9.2 mg/dL (ref 8.9–10.3)
Chloride: 101 mmol/L (ref 98–111)
Creatinine, Ser: 1.02 mg/dL — ABNORMAL HIGH (ref 0.44–1.00)
GFR calc Af Amer: 60 mL/min — ABNORMAL LOW (ref >60)
GFR calc non Af Amer: 52 mL/min — ABNORMAL LOW (ref >60)
Glucose, Bld: 262 mg/dL — ABNORMAL HIGH (ref 70–99)
Potassium: 3.6 mmol/L (ref 3.5–5.1)
Sodium: 137 mmol/L (ref 135–145)

## 2018-10-08 LAB — CBC
HCT: 36.8 % (ref 36.0–46.0)
Hemoglobin: 12.3 g/dL (ref 12.0–15.0)
MCH: 29.7 pg (ref 26.0–34.0)
MCHC: 33.4 g/dL (ref 30.0–36.0)
MCV: 88.9 fL (ref 80.0–100.0)
Platelets: 255 10*3/uL (ref 150–400)
RBC: 4.14 MIL/uL (ref 3.87–5.11)
RDW: 13.2 % (ref 11.5–15.5)
WBC: 8.8 10*3/uL (ref 4.0–10.5)
nRBC: 0 % (ref 0.0–0.2)

## 2018-10-08 LAB — GLUCOSE, CAPILLARY
Glucose-Capillary: 214 mg/dL — ABNORMAL HIGH (ref 70–99)
Glucose-Capillary: 231 mg/dL — ABNORMAL HIGH (ref 70–99)
Glucose-Capillary: 221 mg/dL — ABNORMAL HIGH (ref 70–99)
Glucose-Capillary: 242 mg/dL — ABNORMAL HIGH (ref 70–99)

## 2018-10-08 MED ORDER — DULOXETINE HCL 30 MG PO CPEP
30.0000 mg | ORAL_CAPSULE | Freq: Two times a day (BID) | ORAL | Status: DC
  Administered 2018-10-08 – 2018-10-21 (×26): 30 mg via ORAL
  Filled 2018-10-08 (×26): qty 1

## 2018-10-08 MED ORDER — SENNOSIDES-DOCUSATE SODIUM 8.6-50 MG PO TABS: 1.0000 | ORAL_TABLET | Freq: Every evening | ORAL | Status: DC | PRN

## 2018-10-08 MED ORDER — ATORVASTATIN CALCIUM 10 MG PO TABS
20.0000 mg | ORAL_TABLET | Freq: Every day | ORAL | Status: DC
  Administered 2018-10-09 – 2018-10-21 (×13): 20 mg via ORAL
  Filled 2018-10-08 (×14): qty 2

## 2018-10-08 MED ORDER — ALBUTEROL SULFATE (2.5 MG/3ML) 0.083% IN NEBU: 2.5000 mg | INHALATION_SOLUTION | RESPIRATORY_TRACT | Status: DC | PRN

## 2018-10-08 MED ORDER — INSULIN DETEMIR 100 UNIT/ML ~~LOC~~ SOLN
15.0000 [IU] | Freq: Two times a day (BID) | SUBCUTANEOUS | Status: DC
  Administered 2018-10-08 – 2018-10-10 (×4): 15 [IU] via SUBCUTANEOUS
  Filled 2018-10-08 (×5): qty 0.15

## 2018-10-08 MED ORDER — APIXABAN 5 MG PO TABS
5.0000 mg | ORAL_TABLET | Freq: Two times a day (BID) | ORAL | Status: DC
  Administered 2018-10-08 – 2018-10-21 (×26): 5 mg via ORAL
  Filled 2018-10-08 (×26): qty 1

## 2018-10-08 MED ORDER — DICLOFENAC SODIUM 1 % TD GEL
2.0000 g | Freq: Four times a day (QID) | TRANSDERMAL | Status: DC
  Administered 2018-10-08 – 2018-10-21 (×48): 2 g via TOPICAL
  Filled 2018-10-08: qty 100

## 2018-10-08 MED ORDER — SORBITOL 70 % SOLN
30.0000 mL | Freq: Every day | Status: DC | PRN
  Filled 2018-10-08: qty 30

## 2018-10-08 MED ORDER — ACETAMINOPHEN 325 MG PO TABS
650.0000 mg | ORAL_TABLET | ORAL | Status: DC | PRN
  Administered 2018-10-09 – 2018-10-21 (×19): 650 mg via ORAL
  Filled 2018-10-08 (×19): qty 2

## 2018-10-08 MED ORDER — ACETAMINOPHEN 160 MG/5ML PO SOLN: 650.0000 mg | ORAL | Status: DC | PRN

## 2018-10-08 MED ORDER — ACETAMINOPHEN 650 MG RE SUPP: 650.0000 mg | RECTAL | Status: DC | PRN

## 2018-10-08 MED ORDER — PREGABALIN 75 MG PO CAPS
150.0000 mg | ORAL_CAPSULE | Freq: Every day | ORAL | Status: DC
  Administered 2018-10-09 – 2018-10-21 (×13): 150 mg via ORAL
  Filled 2018-10-08 (×13): qty 2

## 2018-10-08 MED ORDER — IPRATROPIUM-ALBUTEROL 0.5-2.5 (3) MG/3ML IN SOLN: 3.0000 mL | Freq: Four times a day (QID) | RESPIRATORY_TRACT | Status: DC | PRN

## 2018-10-08 MED ORDER — INSULIN ASPART 100 UNIT/ML ~~LOC~~ SOLN
3.0000 [IU] | Freq: Three times a day (TID) | SUBCUTANEOUS | Status: DC
  Administered 2018-10-08 – 2018-10-14 (×17): 3 [IU] via SUBCUTANEOUS

## 2018-10-08 MED ORDER — PANTOPRAZOLE SODIUM 40 MG PO TBEC
40.0000 mg | DELAYED_RELEASE_TABLET | Freq: Two times a day (BID) | ORAL | Status: DC
  Administered 2018-10-08 – 2018-10-21 (×26): 40 mg via ORAL
  Filled 2018-10-08 (×26): qty 1

## 2018-10-08 MED ORDER — INSULIN ASPART 100 UNIT/ML ~~LOC~~ SOLN
0.0000 [IU] | Freq: Three times a day (TID) | SUBCUTANEOUS | Status: DC
  Administered 2018-10-08 – 2018-10-09 (×2): 3 [IU] via SUBCUTANEOUS
  Administered 2018-10-09: 18:00:00 2 [IU] via SUBCUTANEOUS
  Administered 2018-10-09 – 2018-10-10 (×2): 5 [IU] via SUBCUTANEOUS
  Administered 2018-10-10: 2 [IU] via SUBCUTANEOUS
  Administered 2018-10-10: 7 [IU] via SUBCUTANEOUS
  Administered 2018-10-11: 5 [IU] via SUBCUTANEOUS
  Administered 2018-10-11: 13:00:00 3 [IU] via SUBCUTANEOUS
  Administered 2018-10-11: 18:00:00 9 [IU] via SUBCUTANEOUS
  Administered 2018-10-12: 5 [IU] via SUBCUTANEOUS
  Administered 2018-10-12: 3 [IU] via SUBCUTANEOUS
  Administered 2018-10-12 – 2018-10-13 (×2): 5 [IU] via SUBCUTANEOUS
  Administered 2018-10-13: 7 [IU] via SUBCUTANEOUS
  Administered 2018-10-13: 07:00:00 3 [IU] via SUBCUTANEOUS
  Administered 2018-10-14: 12:00:00 5 [IU] via SUBCUTANEOUS
  Administered 2018-10-14: 3 [IU] via SUBCUTANEOUS
  Administered 2018-10-14: 2 [IU] via SUBCUTANEOUS
  Administered 2018-10-15: 3 [IU] via SUBCUTANEOUS
  Administered 2018-10-15: 18:00:00 5 [IU] via SUBCUTANEOUS
  Administered 2018-10-15: 2 [IU] via SUBCUTANEOUS
  Administered 2018-10-16 (×2): 3 [IU] via SUBCUTANEOUS
  Administered 2018-10-16: 18:00:00 5 [IU] via SUBCUTANEOUS
  Administered 2018-10-17: 3 [IU] via SUBCUTANEOUS
  Administered 2018-10-17 – 2018-10-18 (×4): 2 [IU] via SUBCUTANEOUS
  Administered 2018-10-18 – 2018-10-19 (×2): 3 [IU] via SUBCUTANEOUS
  Administered 2018-10-19 – 2018-10-20 (×3): 2 [IU] via SUBCUTANEOUS
  Administered 2018-10-20: 08:00:00 1 [IU] via SUBCUTANEOUS
  Administered 2018-10-20 – 2018-10-21 (×2): 2 [IU] via SUBCUTANEOUS

## 2018-10-08 MED ORDER — INSULIN DETEMIR 100 UNIT/ML ~~LOC~~ SOLN
15.0000 [IU] | Freq: Two times a day (BID) | SUBCUTANEOUS | Status: DC
  Administered 2018-10-08: 15 [IU] via SUBCUTANEOUS
  Filled 2018-10-08 (×2): qty 0.15

## 2018-10-08 MED ORDER — LEVOTHYROXINE SODIUM 25 MCG PO TABS
50.0000 ug | ORAL_TABLET | Freq: Every day | ORAL | Status: DC
  Administered 2018-10-09 – 2018-10-21 (×13): 50 ug via ORAL
  Filled 2018-10-08 (×13): qty 2

## 2018-10-08 MED ORDER — FLUTICASONE PROPIONATE 50 MCG/ACT NA SUSP
1.0000 | Freq: Every day | NASAL | Status: DC
  Administered 2018-10-09 – 2018-10-18 (×9): 1 via NASAL
  Filled 2018-10-08: qty 16

## 2018-10-08 MED ORDER — FLUTICASONE PROPIONATE 50 MCG/ACT NA SUSP: 1.0000 | Freq: Every day | NASAL | 0 refills | Status: DC

## 2018-10-08 MED ORDER — DICLOFENAC SODIUM 1 % TD GEL
2.0000 g | Freq: Four times a day (QID) | TRANSDERMAL | Status: DC
  Administered 2018-10-08: 2 g via TOPICAL
  Filled 2018-10-08: qty 100

## 2018-10-08 NOTE — Progress Notes (Signed)
Pt arrived to unit at approx 1630, greeted by staff. Pt oriented to room, 4MW07, CIR/Rehab unit.  Pt shown how to use call, bed controls and telephone. Pt resting in bed comfortably without c/o, denies pain.  Pt skin intact, few scattered bruises to arms and R shin. Pt states continent of bowel and bladder. Pt states she is ready to do therapies and go home soon, lives with son and daughter n Social worker.   Pt presents to this unit with right facial droop, states she has some blurriness in right eye, and only weakness noted was a slight left grip.

## 2018-10-08 NOTE — Progress Notes (Signed)
Physical Therapy Treatment Patient Details Name: Casey Harrell MRN: [id number] DOB: [birth year] Today's Date: 10/08/2018    History of Present Illness Casey Harrell is a 84 y.o. female with medical history significant for PAF on Eliquis, Hx of CVA, IDT2DM, HTN, HLD, COPD, hypothyroidism, peripheral neuropathy, depression, and OSA who presents to the ED for evaluation of right facial droop. MRI brain and MRA head without contrast were obtained and showed an acute punctate 5 mm infarction in the right paramedian dorsal level of pons with extensive small vessel ischemic changes and old cortical infarcts in the occipital lobes, more extensive on the left than the right.     PT Comments    Pt progressing towards goals. Practiced visual scanning to the R this session given new visual deficits. Pt with increased instability when performing head turns this session. Continues to require min to mod A for steadying assist. Educated about seated HEP. Current recommendations appropriate. Will continue to follow acutely to maximize functional mobility independence and safety.    Follow Up Recommendations  CIR;Supervision/Assistance - 24 hour     Equipment Recommendations  None recommended by PT    Recommendations for Other Services Rehab consult     Precautions / Restrictions Precautions Precautions: Fall Restrictions Weight Bearing Restrictions: No    Mobility  Bed Mobility Overal bed mobility: Needs Assistance Bed Mobility: Supine to Sit     Supine to sit: Min assist     General bed mobility comments: Min A for trunk elevation.   Transfers Overall transfer level: Needs assistance Equipment used: 1 person hand held assist Transfers: Sit to/from Stand Sit to Stand: Min assist         General transfer comment: Min A for steadying assist.   Ambulation/Gait Ambulation/Gait assistance: Min assist;Mod assist Gait Distance (Feet): 100 Feet Assistive device: 1 person hand held assist;Rolling  walker (2 wheeled) Gait Pattern/deviations: Step-through pattern;Decreased stride length;Shuffle Gait velocity: Decreased    General Gait Details: Practiced visual scanning to the R given new visual deficits. Pt with LOB when looking towards R. Continues to require min to mod A for steadying assist.    Stairs             Wheelchair Mobility    Modified Rankin (Stroke Patients Only) Modified Rankin (Stroke Patients Only) Pre-Morbid Rankin Score: No significant disability Modified Rankin: Moderately severe disability     Balance Overall balance assessment: Needs assistance Sitting-balance support: No upper extremity supported;Feet supported Sitting balance-Leahy Scale: Fair     Standing balance support: Single extremity supported;During functional activity Standing balance-Leahy Scale: Poor Standing balance comment: Reliant on UE and external support during mobility tasks.                             Cognition Arousal/Alertness: Awake/alert Behavior During Therapy: WFL for tasks assessed/performed Overall Cognitive Status: Within Functional Limits for tasks assessed                                        Exercises General Exercises - Lower Extremity Long Arc Quad: AROM;Both;10 reps;Seated Hip Flexion/Marching: AROM;Both;10 reps;Seated    General Comments        Pertinent Vitals/Pain Pain Assessment: No/denies pain    Home Living   Living Arrangements: (lives with son, daughter in law and adult grand daughter) Available Help at Discharge: (alone 7  am until 3:30 pm)                Prior Function Level of Independence: Independent          PT Goals (current goals can now be found in the care plan section) Acute Rehab PT Goals Patient Stated Goal: to be independent PT Goal Formulation: With patient Time For Goal Achievement: 10/18/18 Potential to Achieve Goals: Good Progress towards PT goals: Progressing toward goals     Frequency    Min 4X/week      PT Plan Current plan remains appropriate    Co-evaluation              AM-PAC PT "6 Clicks" Mobility   Outcome Measure  Help needed turning from your back to your side while in a flat bed without using bedrails?: A Little Help needed moving from lying on your back to sitting on the side of a flat bed without using bedrails?: A Little Help needed moving to and from a bed to a chair (including a wheelchair)?: A Lot Help needed standing up from a chair using your arms (e.g., wheelchair or bedside chair)?: A Little Help needed to walk in hospital room?: A Lot Help needed climbing 3-5 steps with a railing? : A Lot 6 Click Score: 15    End of Session Equipment Utilized During Treatment: Gait belt Activity Tolerance: Patient tolerated treatment well Patient left: in bed;with call bell/phone within reach;with bed alarm set Nurse Communication: Mobility status PT Visit Diagnosis: Unsteadiness on feet (R26.81);Muscle weakness (generalized) (M62.81)     Time: 6720-9470 PT Time Calculation (min) (ACUTE ONLY): 12 min  Charges:  $Gait Training: 8-22 mins                     Gladys Damme, PT, DPT  Acute Rehabilitation Services  Pager: [phone] Office: [phone]    Lehman Prom 10/08/2018, 12:30 PM

## 2018-10-08 NOTE — TOC Transition Note (Signed)
Transition of Care [facility]) - CM/SW Discharge Note   Patient Details  Name: Casey Harrell MRN: [id number] Date of Birth: 28-Aug-1934  [facility] CM/SW Contact:  Kermit Balo, RN Phone Number: 10/08/2018, 2:02 PM   Clinical Narrative:    Pt is discharging to CIR today. CM signing off.   Final next level of care: IP Rehab Facility Barriers to Discharge: Barriers Resolved   Patient Goals and CMS Choice        Discharge Placement                       Discharge Plan and Services                                     Social Determinants of Health (SDOH) Interventions     Readmission Risk Interventions No flowsheet data found.

## 2018-10-08 NOTE — H&P (Signed)
Physical Medicine and Rehabilitation Admission H&P        Chief Complaint  Patient presents with  . Code Stroke  Chief complaint: Right side weakness HPI: Casey Harrell is an 84 year old right-handed female with history of PAF maintained on Eliquis, history of CVA and received inpatient rehab services, diabetes mellitus, hypertension, COPD/asthma, peripheral neuropathy.  Per chart review patient lives with son and family.  Ambulates without assistive device.  1 level home 6 steps to entry.    Presented 10/03/2018 with right right side weakness and facial droop and mild slurred speech.  Cranial CT scan showed no acute abnormalities noted multiple chronic ischemic infarcts involving the left basal ganglia and the bilateral occipital lobes.  Patient did not receive TPA.  MRI positive for acute punctate 5 mm infarct in the right paramedial dorsal level pons.  MRA with no large vessel occlusion.  Echocardiogram with ejection fraction of 65% hyperdynamic systolic function.  Carotid Doppler with no ICA stenosis.  Neurology service is consulted patient remains on Eliquis as prior to admission.  Tolerating a regular consistency diet.  Therapy evaluations completed and patient was admitted for a comprehensive rehab program.   Review of Systems  Constitutional: Negative for chills and fever.  HENT: Negative for hearing loss.   Eyes: Negative for blurred vision and double vision.  Respiratory: Negative for cough and shortness of breath.   Cardiovascular: Positive for palpitations.  Gastrointestinal: Positive for constipation. Negative for heartburn, nausea and vomiting.       GERD  Genitourinary: Negative for dysuria, flank pain and hematuria.  Musculoskeletal: Positive for joint pain and myalgias.  Skin: Negative for rash.  Neurological: Positive for dizziness, speech change and focal weakness.  All other systems reviewed and are negative.   Past Medical History:  Diagnosis Date  . ASTHMA  07/15/2008  . COPD (chronic obstructive pulmonary disease) (HCC)    . DIABETES-TYPE 2 07/15/2008  . DIABETIC PERIPHERAL NEUROPATHY 09/02/2009  . GERD 07/15/2008  . HYPERTENSION 07/15/2008  . Normal cardiac stress test      low risk Nuc 2009, Feb 2011  . Paroxysmal atrial fibrillation (HCC)    . PVD (peripheral vascular disease) (HCC) 06/18/09    RCE  . Stroke (HCC)    . Vertigo           Past Surgical History:  Procedure Laterality Date  . ABDOMINAL HYSTERECTOMY   1971  . APPENDECTOMY   1971  . CAROTID ENDARTERECTOMY   06/18/09    RCE  . CESAREAN SECTION      . colectomy and colostomy      . FLEXIBLE SIGMOIDOSCOPY        diverticulitis  . FOOT SURGERY        right foot X 2  . LAPAROTOMY        X 3 with adhesions lysis  . TONSILLECTOMY      . TUBAL LIGATION             Family History  Problem Relation Age of Onset  . Diabetes Mother    . Arthritis Mother    . Heart disease Mother    . Heart disease Father    . Heart disease Sister    . Heart disease Brother    . Cancer Brother 60        lung cancer  . Celiac disease Sister    . Stroke Maternal Grandfather    . Cancer Paternal Grandfather  lung  . Heart disease Sister    . Asthma Sister      Social History:  reports that she has never smoked. She has never used smokeless tobacco. She reports that she does not drink alcohol or use drugs. Allergies:       Allergies  Allergen Reactions  . Doxycycline Hyclate Nausea And Vomiting  . Morphine Sulfate Other (See Comments)      Causes hallucinations  . Iohexol Hives      Patient stated in 2010 this caused HIVES    . Moxifloxacin Other (See Comments)      Reaction not readily recalled??          Medications Prior to Admission  Medication Sig Dispense Refill  . acetaminophen (TYLENOL) 325 MG tablet Take 325-650 mg by mouth every 6 (six) hours as needed (for pain).       Marland Kitchen amLODipine (NORVASC) 5 MG tablet Take 1 tablet (5 mg total) by mouth daily. 30 tablet 5   . aspirin EC 81 MG tablet Take 81 mg by mouth at bedtime.       Marland Kitchen atorvastatin (LIPITOR) 40 MG tablet TAKE 1 TABLET BY MOUTH EVERY DAY (Patient taking differently: Take 40 mg by mouth daily. ) 90 tablet 3  . BIOTIN PO Take 1 tablet by mouth daily.      Marland Kitchen CALCIUM PO Take 1 tablet by mouth daily.      . carvedilol (COREG) 6.25 MG tablet TAKE 1 TABLET BY MOUTH TWICE A DAY WITH MEALS (Patient taking differently: Take 6.25 mg by mouth 2 (two) times daily with a meal. ) 180 tablet 1  . Cholecalciferol (VITAMIN D3) 2000 units TABS Take 2,000 Units by mouth daily. Reported on 04/26/2015 30 tablet 0  . DULoxetine (CYMBALTA) 30 MG capsule TAKE 1 CAPSULE (30MG ) TWICE DAILY. (Patient taking differently: Take 30 mg by mouth 2 (two) times daily. ) 180 capsule 1  . ELIQUIS 5 MG TABS tablet TAKE 1 TABLET BY MOUTH TWICE A DAY (Patient taking differently: Take 5 mg by mouth 2 (two) times daily. ) 60 tablet 0  . HUMALOG KWIKPEN 100 UNIT/ML KwikPen INJECT 9-14 UNITS SUBCUTANEOUSLY TWICE DAILY WITH MEAL (9 UNITS BREAKFAST AND 14 UNITS AFTER SUPPER) (Patient taking differently: INJECT 9-14 UNITS SUBCUTANEOUSLY TWICE DAILY WITH MEAL (9 UNITS BREAKFAST AND 14 UNITS AFTER SUPPER)) 15 mL 1  . irbesartan (AVAPRO) 150 MG tablet TAKE 1 TABLET BY MOUTH EVERY DAY (Patient taking differently: Take 150 mg by mouth daily. ) 90 tablet 1  . LEVEMIR FLEXTOUCH 100 UNIT/ML Pen INJECT 40 UNITS INTO THE SKIN TWICE A DAY (Patient taking differently: Inject 40 Units into the skin 2 (two) times daily. ) 15 mL 1  . levothyroxine (SYNTHROID) 50 MCG tablet TAKE 1 TABLET BY MOUTH EVERY DAY BEFORE BREAKFAST (Patient taking differently: Take 50 mcg by mouth daily before breakfast. ) 90 tablet 0  . metFORMIN (GLUCOPHAGE) 500 MG tablet Take 1/2 tablet by mouth twice a day with a meal. (Patient taking differently: Take 250 mg by mouth 2 (two) times daily with a meal. ) 90 tablet 0  . pantoprazole (PROTONIX) 40 MG tablet TAKE 1 TABLET BY MOUTH TWICE A  DAY (Patient taking differently: Take 40 mg by mouth 2 (two) times daily. ) 180 tablet 2  . ACCU-CHEK AVIVA PLUS test strip TEST ONCE DAILY. DX E11.9 (Patient taking differently: 1 each by Other route daily. ) 100 each 3  . albuterol (ACCUNEB) 1.25 MG/3ML nebulizer solution Take  3 mLs (1.25 mg total) by nebulization every 4 (four) hours as needed for wheezing. Reported on 04/26/2015 (Patient not taking: Reported on 10/04/2018) 75 mL 12  . Albuterol Sulfate (PROAIR RESPICLICK) 108 (90 Base) MCG/ACT AEPB Inhale 2 puffs into the lungs every 4 (four) hours as needed. (Patient not taking: Reported on 10/04/2018) 1 each 1  . diclofenac sodium (VOLTAREN) 1 % GEL APPLY 2GRAMS TOPICALLY 4 TIMES DAILY TO RIGHT ANKLE (Patient not taking: Reported on 10/04/2018) 100 g 1  . glucose blood (ACCU-CHEK AVIVA PLUS) test strip Test twice daily.  Dx e11.9 200 each 12  . Insulin Detemir (LEVEMIR FLEXTOUCH) 100 UNIT/ML Pen INJECT 40 UNITS INTO THE SKIN TWICE A DAY (Patient not taking: Reported on 10/04/2018) 3 mL 5  . ipratropium-albuterol (DUONEB) 0.5-2.5 (3) MG/3ML SOLN Take 3 mLs by nebulization every 6 (six) hours as needed (shortness of breath or wheezing). (Patient not taking: Reported on 10/04/2018) 360 mL 0  . LORazepam (ATIVAN) 0.5 MG tablet TAKE 1 TABLET (0.5 MG TOTAL) BY MOUTH EVERY 8 (EIGHT) HOURS AS NEEDED FOR ANXIETY. (Patient not taking: Reported on 10/04/2018) 20 tablet 0  . LYRICA 150 MG capsule TAKE ONE CAPSULE BY MOUTH EVERY DAY (Patient not taking: Reported on 10/04/2018) 90 capsule 1  . ondansetron (ZOFRAN-ODT) 4 MG disintegrating tablet Take 1 tablet (4 mg total) by mouth every 8 (eight) hours as needed for nausea or vomiting. (Patient not taking: Reported on 10/04/2018) 20 tablet 0  . ONE TOUCH ULTRA TEST test strip TEST TWICE DAILY 100 each 12  . traZODone (DESYREL) 50 MG tablet TAKE 1/2 TO 1 TABLET BY MOUTH AT BEDTIME AS NEEDED FOR SLEEP (Patient not taking: Reported on 10/04/2018) 90 tablet 1      Drug  Regimen Review Drug regimen was reviewed and remains appropriate with no significant issues identified   Home: Home Living Family/patient expects to be discharged to:: Private residence Living Arrangements: Children Available Help at Discharge: Available PRN/intermittently Type of Home: House Home Access: Stairs to enter Secretary/administrator of Steps: 7 Entrance Stairs-Rails: Can reach both Home Layout: One level Bathroom Shower/Tub: Health visitor: Standard Bathroom Accessibility: Yes Home Equipment: Environmental consultant - 2 wheels, Shower seat - built in, Java - single point Additional Comments: pt's family available from 1:30 through the night, until morning when they leave for work   Functional History: Prior Function Level of Independence: Needs assistance Gait / Transfers Assistance Needed: pt was ambulating without AD; ADL's / Homemaking Assistance Needed: family members assist with IADL; pt reports she was independent with ADL   Functional Status:  Mobility: Bed Mobility Overal bed mobility: Needs Assistance Bed Mobility: Sit to Supine Supine to sit: HOB elevated, Min guard Sit to supine: Supervision General bed mobility comments: Supervision for safety for return to supine  Transfers Overall transfer level: Needs assistance Equipment used: 1 person hand held assist Transfers: Sit to/from Stand Sit to Stand: Min assist General transfer comment: Min A for lift assist and steadying assist.  Ambulation/Gait Ambulation/Gait assistance: Min assist, Mod assist Gait Distance (Feet): 75 Feet Assistive device: 1 person hand held assist, Rolling walker (2 wheeled) Gait Pattern/deviations: Step-through pattern, Decreased stride length, Shuffle General Gait Details: Min to mod a for steadying assist with HHA. Cues for increasing step height and length in RLE as pt tended to have shuffle type pattern in RLE.  Gait velocity: Decreased  Gait velocity interpretation: <1.31  ft/sec, indicative of household ambulator   ADL: ADL Overall ADL's :  Needs assistance/impaired Eating/Feeding: Set up, Sitting Grooming: Min guard, Standing Grooming Details (indicate cue type and reason): pt completed grooming while standing at sink level for 8 min Upper Body Bathing: Min guard, Minimal assistance Upper Body Bathing Details (indicate cue type and reason): pt faituges quickly minA after duration of time Lower Body Bathing: Min guard, Minimal assistance Upper Body Dressing : Min guard, Sitting Lower Body Dressing: Min guard, Sit to/from stand, Minimal assistance Lower Body Dressing Details (indicate cue type and reason): minA for stability in standing  Toilet Transfer: Minimal assistance, Ambulation, RW Toilet Transfer Details (indicate cue type and reason): minA for mobility and safe use of DME Toileting- Clothing Manipulation and Hygiene: Minimal assistance, Sit to/from stand Functional mobility during ADLs: Minimal assistance, Rolling walker, Cueing for safety General ADL Comments: minA for more dynamic level activities;pt requires support of sink and RW with ADL and functioanl mobility    Cognition: Cognition Overall Cognitive Status: Within Functional Limits for tasks assessed Arousal/Alertness: Awake/alert Orientation Level: Oriented X4 Cognition Arousal/Alertness: Awake/alert Behavior During Therapy: WFL for tasks assessed/performed Overall Cognitive Status: Within Functional Limits for tasks assessed General Comments: pt with appropriate concerns, wanting to look in the mirror to see how asymmetrical her face is    Physical Exam: Blood pressure 137/66, pulse 66, temperature 98.1 F (36.7 C), temperature source Oral, resp. rate 18, height  (1.549 m), weight 56.7 kg, SpO2 96 %. Physical Exam  Constitutional: She is oriented to person, place, and time. She appears well-developed and well-nourished. No distress.  HENT:  Head: Normocephalic and atraumatic.   Eyes: Pupils are equal, round, and reactive to light. Conjunctivae are normal.  Cardiovascular: Normal rate and regular rhythm. Exam reveals no friction rub.  Murmur heard. Respiratory: Effort normal. No respiratory distress. She has no wheezes.  GI: Soft. She exhibits no distension. There is no abdominal tenderness.  Musculoskeletal:        General: No edema.     Comments: Mild pain along right neck, SCM, Trap with palpation  Neurological: She is alert and oriented to person, place, and time.  Patient is alert.  No acute distress.  Makes good eye contact with examiner and follows commands.  She provides her name, age and date of birth.  Fair awareness of deficits, right central 7 and tongue deviation, speech slurred. RUE 4/5 prox to distal. RLE 4/5. LUE and LLE 5/5. Reasonable sitting balance, decreased FTN, HTS on right. Intentional tremor bilaterally.   Skin: Skin is warm and dry.  Psychiatric: She has a normal mood and affect. Her behavior is normal. Judgment and thought content normal.      Lab Results Last 48 Hours  Results for orders placed or performed during the hospital encounter of 10/03/18 (from the past 48 hour(s))  Glucose, capillary     Status: Abnormal    Collection Time: 10/06/18 11:44 AM  Result Value Ref Range    Glucose-Capillary 314 (H) 70 - 99 mg/dL    Comment 1 Notify RN      Comment 2 Document in Chart    Glucose, capillary     Status: Abnormal    Collection Time: 10/06/18  4:14 PM  Result Value Ref Range    Glucose-Capillary 279 (H) 70 - 99 mg/dL    Comment 1 Notify RN      Comment 2 Document in Chart    Glucose, capillary     Status: Abnormal    Collection Time: 10/06/18  9:11 PM  Result Value  Ref Range    Glucose-Capillary 349 (H) 70 - 99 mg/dL  Glucose, capillary     Status: Abnormal    Collection Time: 10/07/18  6:05 AM  Result Value Ref Range    Glucose-Capillary 255 (H) 70 - 99 mg/dL    Comment 1 Notify RN      Comment 2 Document in Chart     Hemoglobin and hematocrit, blood     Status: None    Collection Time: 10/07/18  7:53 AM  Result Value Ref Range    Hemoglobin 12.9 12.0 - 15.0 g/dL    HCT 16.139.0 09.636.0 - 04.546.0 %      Comment: Performed at [facility], [address] [place], [place] [address]  Basic metabolic panel     Status: Abnormal    Collection Time: 10/07/18  7:53 AM  Result Value Ref Range    Sodium 135 135 - 145 mmol/L    Potassium 3.8 3.5 - 5.1 mmol/L    Chloride 102 98 - 111 mmol/L    CO2 24 22 - 32 mmol/L    Glucose, Bld 222 (H) 70 - 99 mg/dL    BUN 21 8 - 23 mg/dL    Creatinine, Ser 1.910.97 0.44 - 1.00 mg/dL    Calcium 9.3 8.9 - 47.810.3 mg/dL    GFR calc non Af Amer 55 (L) >60 mL/min    GFR calc Af Amer >60 >60 mL/min    Anion gap 9 5 - 15      Comment: Performed at [facility], [address] [place], [place] [address]  Glucose, capillary     Status: Abnormal    Collection Time: 10/07/18 12:23 PM  Result Value Ref Range    Glucose-Capillary 227 (H) 70 - 99 mg/dL    Comment 1 Notify RN      Comment 2 Document in Chart    Glucose, capillary     Status: Abnormal    Collection Time: 10/07/18  3:42 PM  Result Value Ref Range    Glucose-Capillary 248 (H) 70 - 99 mg/dL    Comment 1 Notify RN      Comment 2 Document in Chart    Glucose, capillary     Status: Abnormal    Collection Time: 10/07/18  9:24 PM  Result Value Ref Range    Glucose-Capillary 395 (H) 70 - 99 mg/dL    Comment 1 Notify RN      Comment 2 Document in Chart    CBC     Status: None    Collection Time: 10/08/18  3:37 AM  Result Value Ref Range    WBC 8.8 4.0 - 10.5 K/uL    RBC 4.14 3.87 - 5.11 MIL/uL    Hemoglobin 12.3 12.0 - 15.0 g/dL    HCT 13.036.8 86.536.0 - 78.446.0 %    MCV 88.9 80.0 - 100.0 fL    MCH 29.7 26.0 - 34.0 pg    MCHC 33.4 30.0 - 36.0 g/dL    RDW 69.613.2 29.511.5 - 28.415.5 %    Platelets 255 150 - 400 K/uL    nRBC 0.0 0.0 - 0.2 %      Comment: Performed at [facility], [address] [place], [place] [address]   Basic metabolic panel     Status: Abnormal    Collection Time: 10/08/18  3:37 AM  Result Value Ref Range    Sodium 137 135 - 145 mmol/L    Potassium  3.6 3.5 - 5.1 mmol/L    Chloride 101 98 - 111 mmol/L    CO2 26 22 - 32 mmol/L    Glucose, Bld 262 (H) 70 - 99 mg/dL    BUN 20 8 - 23 mg/dL    Creatinine, Ser 4.09 (H) 0.44 - 1.00 mg/dL    Calcium 9.2 8.9 - 81.1 mg/dL    GFR calc non Af Amer 52 (L) >60 mL/min    GFR calc Af Amer 60 (L) >60 mL/min    Anion gap 10 5 - 15      Comment: Performed at [facility], [address] [place], [place] [address]  Glucose, capillary     Status: Abnormal    Collection Time: 10/08/18  6:08 AM  Result Value Ref Range    Glucose-Capillary 214 (H) 70 - 99 mg/dL      Imaging Results (Last 48 hours)  No results found.           Medical Problem List and Plan: 1.  Right side weakness with facial droop secondary to acute punctate infarct right paramedial dorsal level of pons as well old cortical infarcts in the occipital lobes more extensive on the left than on the right             -admit to inpatient rehab 2.  Antithrombotics: -DVT/anticoagulation: Eliquis             -antiplatelet therapy: N/A 3. Pain Management: Lyrica 150 mg daily, Cymbalta 30 mg twice daily 4. Mood: Provide emotional support             -antipsychotic agents: N/A 5. Neuropsych: This patient is capable of making decisions on his own behalf. 6. Skin/Wound Care: Routine skin checks 7. Fluids/Electrolytes/Nutrition: Routine in and outs with follow-up chemistries 8.  PAF.  Cardiac rate controlled at present, monitor with increased activity during therapy             -  Continue Eliquis 9.  Diabetes mellitus with peripheral neuropathy.  Hemoglobin A1c 9.6.  NovoLog 3 units 3 times daily, Levemir 10 units daily.  Check blood sugars before meals and at bedtime.  Diabetic teaching by team 10.  COPD/asthma.  Continue nebulizers as directed 11.  Hypothyroidism.  Synthroid 12.   GERD.  Protonix   Post Admission Physician Evaluation: 1. Functional deficits secondary  to right paramedian dorsal pontine infarct. 2. Patient is admitted to receive collaborative, interdisciplinary care between the physiatrist, rehab nursing staff, and therapy team. 3. Patient's level of medical complexity and substantial therapy needs in context of that medical necessity cannot be provided at a lesser intensity of care such as a SNF. 4. Patient has experienced substantial functional loss from his/her baseline which was documented above under the "Functional History" and "Functional Status" headings.  Judging by the patient's diagnosis, physical exam, and functional history, the patient has potential for functional progress which will result in measurable gains while on inpatient rehab.  These gains will be of substantial and practical use upon discharge  in facilitating mobility and self-care at the household level. 5. Physiatrist will provide 24 hour management of medical needs as well as oversight of the therapy plan/treatment and provide guidance as appropriate regarding the interaction of the two. 6. The Preadmission Screening has been reviewed and patient status is unchanged unless otherwise stated above. 7. 24 hour rehab nursing will assist with bladder management, bowel management, safety, skin/wound care, disease management, medication administration, pain management and patient education  and help integrate therapy concepts, techniques,education, etc. 8. PT will assess and treat for/with: Lower extremity strength, range of motion, stamina, balance, functional mobility, safety, adaptive techniques and equipment, NMR, .   Goals are: mod I. 9. OT will assess and treat for/with: ADL's, functional mobility, safety, upper extremity strength, adaptive techniques and equipment, NMR.   Goals are: mod I. Therapy may proceed with showering this patient. 10. SLP will assess and treat for/with: speech,  communication.  Goals are: mod I. 11. Case Management and Social Worker will assess and treat for psychological issues and discharge planning. 12. Team conference will be held weekly to assess progress toward goals and to determine barriers to discharge. 13. Patient will receive at least 3 hours of therapy per day at least 5 days per week. 14. ELOS: 10-14 days       15. Prognosis:  excellent   I have personally performed a face to face diagnostic evaluation of this patient and formulated the key components of the plan.  Additionally, I have personally reviewed laboratory data, imaging studies, as well as relevant notes and concur with the physician assistant's documentation above.  Ranelle Oyster, MD, FAAPMR       Mcarthur Rossetti Angiulli, PA-C 10/08/2018

## 2018-10-08 NOTE — Discharge Summary (Signed)
Physician Discharge Summary  Casey Harrell WUJ:[id number] DOB: 03-Sep-1934 DOA: 10/03/2018  PCP: Casey Covey, MD  Admit date: 10/03/2018 Discharge date: 10/08/2018  Time spent: 45 minutes  Recommendations for Outpatient Follow-up:  Patient will be discharged to inpatient rehab.  Patient will need to follow up with primary care provider within one week of discharge.  Follow up with neurology. Patient should continue medications as prescribed.  Patient should follow a heart healthy/carb modified diet.   Discharge Diagnoses:  Acute CVA Paroxysmal atrial fibrillation: Diabetes mellitus, type II Hypertension Hyperlipidemia COPD Hypothyroidism GERD Depression/anxiety Obstructive sleep apnea Chronic kidney disease, stage III  Discharge Condition: Stable  Diet recommendation: heart healthy/carb modified  Filed Weights   10/03/18 2132 10/03/18 2133  Weight: 61.7 kg 56.7 kg    History of present illness:  On 10/03/2018 by Dr. Freddi Che Harrell a 84 y.o.femalewith medical history significant forPAF on Eliquis, Hx of CVA, IDT2DM, HTN, HLD, COPD,hypothyroidism, peripheral neuropathy, depression, and OSA who presents to the EDfor evaluation of right facial droop. Patient states she was in her usual state of health until around 3 PM on 10/03/2018. She was sitting down on her couch when her son noticed that she had drooping of her right face and subsequently called EMS. She had a headache at that time. Patient denies any associated change in vision, slurred speech, dysphagia, chest pain, palpitations, dyspnea, abdominal pain, nausea, vomiting, diarrhea, dysuria, or focal weakness. She says she normally ambulates with a cane but has not tried to walk since onset of her symptoms.   Hospital Course:  Acute CVA -Presented with right sided facial droop -CT head: No acute intracranial infarct -MRI head: Acute punctate 5 mm infarction in the right paramedian dorsal level or  pons -MRA does not show any acute large or medium vessel occlusion -LDL 57, Hemoglobin A1c 9.6 -Carotid Doppler shows bilateral ICA 1 to 39% stenosis.  Bilateral vertebral arteries demonstrate antegrade flow -Echocardiogram EF > 65%, mildly increased LV wall thickness. LV diastolic impaired relaxation -Neurology consulted and appreciated, Discussed with Dr. Pearlean Harrell- Aspirin discontinued, given patient's age and increased risk of bleeding with Eliquis and aspirin.  -PT OT recommending inpatient rehab -Patient rehab consulted- pending consult and eval -patient did slip out of bed on 10/06/2018- states she did not hit her head, not sure how she slipped out- stable -patient will need to follow up with Dr. Pearlean Harrell- may need to change to Pradaxa per Dr. Roda Harrell (via phone)- given that patient had recurrent stroke Asp and Eliquis at home  Paroxysmal atrial fibrillation: -CHA2DS2-VASc Scoreis at least 7. -Continue Eliquis -Coreg held -Currently in sinus rhythm   Diabetes mellitus, type II -Hemoglobin A1c 9.6 -Continue Levemir, insulin sliding scale, CBG monitoring (see of note dose of Levemir decreased while hospitalized) -Discussed with son- may follow up with PCP for better control of diabetes. If patient is not checking her blood sugar regularly, may consider libre  Hypertension -Given Acute CVA, allow for permissive HTN -BP has been stable -Home meds: Amlodipine, Coreg, irbesartan currently held  Hyperlipidemia -Continue statin  COPD -Stable, no wheezing on exam -Continue nebs PRN  Hypothyroidism -Continue Synthroid  GERD -Continue Protonix  Depression/anxiety -Continue duloxetine  Obstructive sleep apnea -Has been intolerant to CPAP. Continue supplemental oxygen.  Chronic kidney disease, stage III -Creatitinine currently 1.02 (baseline 0.9-1.1) -stable, continue to monitor BMP  Right ear pain/neck pain -patient with mild TTP of the right neck and lymph node  palpated -ordered flonase, voltaren gel, and  warm compresses -has been ongoing since stroke  Consultants Neurology Inpatient rehab  Procedures  Carotid Doppler Echocardiogram  Code: DNR  Discharge Exam: Vitals:   10/08/18 0354 10/08/18 0811  BP: 137/66 (!) 165/80  Pulse: 66 (!) 58  Resp: 18 18  Temp: 98.1 F (36.7 C) 97.7 F (36.5 C)  SpO2: 96% 94%     General: Well developed, well nourished, NAD, appears stated age  HEENT: NCAT, mucous membranes moist.   Neck: Supple, Lymph node palpated right superficial cervical  Cardiovascular: S1 S2 auscultated, RRR  Respiratory: Clear to auscultation bilaterally with equal chest rise  Abdomen: Soft, nontender, nondistended, + bowel sounds  Extremities: warm dry without cyanosis clubbing or edema  Neuro: AAOx3, right-sided weakness as well as right facial weakness with droop.  Patient unable to look to the right, nystagmus with leftward gaze  Psych: Normal affect and demeanor with intact judgement and insight, pleasant  Discharge Instructions Discharge Instructions    Ambulatory referral to Neurology   Complete by:  As directed    An appointment is requested in approximately: 4 weeks   Discharge instructions   Complete by:  As directed    Patient will be discharged to inpatient rehab.  Patient will need to follow up with primary care provider within one week of discharge.  Follow up with neurology. Patient should continue medications as prescribed.  Patient should follow a heart healthy/carb modified diet.     Allergies as of 10/08/2018      Reactions   Doxycycline Hyclate Nausea And Vomiting   Morphine Sulfate Other (See Comments)   Causes hallucinations   Iohexol Hives   Patient stated in 2010 this caused HIVES   Moxifloxacin Other (See Comments)   Reaction not readily recalled??      Medication List    STOP taking these medications   aspirin EC 81 MG tablet   ipratropium-albuterol 0.5-2.5 (3) MG/3ML  Soln Commonly known as:  DUONEB   LORazepam 0.5 MG tablet Commonly known as:  ATIVAN   Lyrica 150 MG capsule Generic drug:  pregabalin   ondansetron 4 MG disintegrating tablet Commonly known as:  ZOFRAN-ODT   traZODone 50 MG tablet Commonly known as:  DESYREL     TAKE these medications   acetaminophen 325 MG tablet Commonly known as:  TYLENOL Take 325-650 mg by mouth every 6 (six) hours as needed (for pain).   Albuterol Sulfate 108 (90 Base) MCG/ACT Aepb Commonly known as:  ProAir RespiClick Inhale 2 puffs into the lungs every 4 (four) hours as needed. What changed:  Another medication with the same name was removed. Continue taking this medication, and follow the directions you see here.   amLODipine 5 MG tablet Commonly known as:  NORVASC Take 1 tablet (5 mg total) by mouth daily.   atorvastatin 40 MG tablet Commonly known as:  LIPITOR TAKE 1 TABLET BY MOUTH EVERY DAY   BIOTIN PO Take 1 tablet by mouth daily.   CALCIUM PO Take 1 tablet by mouth daily.   carvedilol 6.25 MG tablet Commonly known as:  COREG TAKE 1 TABLET BY MOUTH TWICE A DAY WITH MEALS What changed:    how much to take  how to take this  when to take this  additional instructions   diclofenac sodium 1 % Gel Commonly known as:  VOLTAREN APPLY 2GRAMS TOPICALLY 4 TIMES DAILY TO RIGHT ANKLE   DULoxetine 30 MG capsule Commonly known as:  CYMBALTA TAKE 1 CAPSULE ( ) TWICE DAILY. What  changed:  See the new instructions.   Eliquis 5 MG Tabs tablet Generic drug:  apixaban TAKE 1 TABLET BY MOUTH TWICE A DAY What changed:  how much to take   fluticasone 50 MCG/ACT nasal spray Commonly known as:  FLONASE Place 1 spray into both nostrils daily. Start taking on:  October 09, 2018   glucose blood test strip Commonly known as:  Accu-Chek Aviva Plus Test twice daily.  Dx e11.9 What changed:  Another medication with the same name was removed. Continue taking this medication, and follow the  directions you see here.   ONE TOUCH ULTRA TEST test strip Generic drug:  glucose blood TEST TWICE DAILY What changed:  Another medication with the same name was removed. Continue taking this medication, and follow the directions you see here.   HumaLOG KwikPen 100 UNIT/ML KwikPen Generic drug:  insulin lispro INJECT 9-14 UNITS SUBCUTANEOUSLY TWICE DAILY WITH MEAL (9 UNITS BREAKFAST AND 14 UNITS AFTER SUPPER)   irbesartan 150 MG tablet Commonly known as:  AVAPRO TAKE 1 TABLET BY MOUTH EVERY DAY   Levemir FlexTouch 100 UNIT/ML Pen Generic drug:  Insulin Detemir INJECT 40 UNITS INTO THE SKIN TWICE A DAY What changed:    See the new instructions.  Another medication with the same name was removed. Continue taking this medication, and follow the directions you see here.   levothyroxine 50 MCG tablet Commonly known as:  SYNTHROID TAKE 1 TABLET BY MOUTH EVERY DAY BEFORE BREAKFAST What changed:  See the new instructions.   metFORMIN 500 MG tablet Commonly known as:  GLUCOPHAGE Take 1/2 tablet by mouth twice a day with a meal. What changed:    how much to take  how to take this  when to take this  additional instructions   pantoprazole 40 MG tablet Commonly known as:  PROTONIX TAKE 1 TABLET BY MOUTH TWICE A DAY   Vitamin D3 50 MCG (2000 UT) Tabs Take 2,000 Units by mouth daily. Reported on 04/26/2015      Allergies  Allergen Reactions  . Doxycycline Hyclate Nausea And Vomiting  . Morphine Sulfate Other (See Comments)    Causes hallucinations  . Iohexol Hives    Patient stated in 2010 this caused HIVES   . Moxifloxacin Other (See Comments)    Reaction not readily recalled??   Follow-up Information    Burchette, Elberta FortisBruce W, MD. Schedule an appointment as soon as possible for a visit in 1 week(s).   Specialty:  Family Medicine Why:  Hospital follow up Contact information: [address] [facility] [place] [address] [phone]        Micki RileySethi, Pramod S, MD  Follow up.   Specialties:  Neurology, Radiology Contact information: [address] [place] [address] [phone]            The results of significant diagnostics from this hospitalization (including imaging, microbiology, ancillary and laboratory) are listed below for reference.    Significant Diagnostic Studies: Mr Shirlee LatchMra Head MWWo Contrast  Result Date: 10/03/2018 CLINICAL DATA:  Code stroke.  Acute facial droop. EXAM: MRI HEAD WITHOUT CONTRAST MRA HEAD WITHOUT CONTRAST TECHNIQUE: Multiplanar, multiecho pulse sequences of the brain and surrounding structures were obtained without intravenous contrast. Angiographic images of the head were obtained using MRA technique without contrast. COMPARISON:  Head CT same day.  MRI 03/30/2017 FINDINGS: MRI HEAD FINDINGS Brain: Acute 5 mm infarction in the right paramedian dorsal lower pons. No other acute insult. Elsewhere, there are chronic small-vessel ischemic  changes affecting the pons, with a few small foci of hemosiderin deposition. There are old small vessel infarctions within the cerebellum, some with hemosiderin deposition. There is extensive chronic small vessel ischemic change throughout the cerebral hemispheric white matter. There are old occipital cortical and subcortical infarctions, more extensive on the left than the right. There are old lacunar infarctions affecting the thalami and basal ganglia. No mass lesion. No hydrocephalus or extra-axial collection. Vascular: There is atherosclerotic calcification of the major vessels at the base of the brain. Skull and upper cervical spine: Negative Sinuses/Orbits: Clear/normal Other: None MRA HEAD FINDINGS Both internal carotid arteries are patent through the skull base and siphon regions. The anterior and middle cerebral vessels are patent. There are multiple intracranial stenoses and areas of irregularity, more extensive in the left anterior and middle cerebral vessels than right. Both  vertebral arteries are patent to the basilar. 50% stenosis of the V4 segment on the left. Large right PICA. Basilar artery shows mild atherosclerotic irregularity but no flow limiting stenosis. There is flow within the superior cerebellar and posterior cerebral arteries, but the vessels show pronounced atherosclerotic irregularity and narrowing. IMPRESSION: Acute punctate 5 mm infarction in the right para median dorsal level or pons. Extensive small vessel ischemic changes elsewhere throughout the brainstem, cerebellum, thalami, basal ganglia and hemispheric white matter, some with old hemosiderin deposition. Old cortical infarctions in the occipital lobes, more extensive on the left than the right. MR angiography does not show any acute large or medium vessel occlusion. There is advanced diffuse intracranial atherosclerotic disease, most pronounced in the left anterior and middle cerebral artery territories and in the posterior circulation branch vessels Electronically Signed   By: Paulina Fusi M.D.   On: 10/03/2018 21:22   Mr Brain Wo Contrast  Result Date: 10/03/2018 CLINICAL DATA:  Code stroke.  Acute facial droop. EXAM: MRI HEAD WITHOUT CONTRAST MRA HEAD WITHOUT CONTRAST TECHNIQUE: Multiplanar, multiecho pulse sequences of the brain and surrounding structures were obtained without intravenous contrast. Angiographic images of the head were obtained using MRA technique without contrast. COMPARISON:  Head CT same day.  MRI 03/30/2017 FINDINGS: MRI HEAD FINDINGS Brain: Acute 5 mm infarction in the right paramedian dorsal lower pons. No other acute insult. Elsewhere, there are chronic small-vessel ischemic changes affecting the pons, with a few small foci of hemosiderin deposition. There are old small vessel infarctions within the cerebellum, some with hemosiderin deposition. There is extensive chronic small vessel ischemic change throughout the cerebral hemispheric white matter. There are old occipital cortical  and subcortical infarctions, more extensive on the left than the right. There are old lacunar infarctions affecting the thalami and basal ganglia. No mass lesion. No hydrocephalus or extra-axial collection. Vascular: There is atherosclerotic calcification of the major vessels at the base of the brain. Skull and upper cervical spine: Negative Sinuses/Orbits: Clear/normal Other: None MRA HEAD FINDINGS Both internal carotid arteries are patent through the skull base and siphon regions. The anterior and middle cerebral vessels are patent. There are multiple intracranial stenoses and areas of irregularity, more extensive in the left anterior and middle cerebral vessels than right. Both vertebral arteries are patent to the basilar. 50% stenosis of the V4 segment on the left. Large right PICA. Basilar artery shows mild atherosclerotic irregularity but no flow limiting stenosis. There is flow within the superior cerebellar and posterior cerebral arteries, but the vessels show pronounced atherosclerotic irregularity and narrowing. IMPRESSION: Acute punctate 5 mm infarction in the right para median dorsal level or  pons. Extensive small vessel ischemic changes elsewhere throughout the brainstem, cerebellum, thalami, basal ganglia and hemispheric white matter, some with old hemosiderin deposition. Old cortical infarctions in the occipital lobes, more extensive on the left than the right. MR angiography does not show any acute large or medium vessel occlusion. There is advanced diffuse intracranial atherosclerotic disease, most pronounced in the left anterior and middle cerebral artery territories and in the posterior circulation branch vessels Electronically Signed   By: Paulina Fusi M.D.   On: 10/03/2018 21:22   Ct Head Code Stroke Wo Contrast  Result Date: 10/03/2018 CLINICAL DATA:  Code stroke. Initial evaluation for acute facial droop. EXAM: CT HEAD WITHOUT CONTRAST TECHNIQUE: Contiguous axial images were obtained from  the base of the skull through the vertex without intravenous contrast. COMPARISON:  Previous MRI from 03/30/2017 FINDINGS: Brain: Generalized age-related cerebral volume loss with moderate chronic microvascular ischemic disease. Encephalomalacia within the bilateral occipital lobes compatible with remote ischemic infarcts. Additional small remote left basal ganglia lacunar infarct, with additional tiny remote left cerebellar infarct. No acute intracranial hemorrhage. No acute large vessel territory infarct. No mass lesion, midline shift or mass effect. No hydrocephalus. No extra-axial fluid collection. Vascular: No hyperdense vessel. Scattered vascular calcifications noted within the carotid siphons. Skull: Scalp soft tissues and calvarium within normal limits. Sinuses/Orbits: Globes and orbital soft tissues normal. Mild-to-moderate circumferential mucosal thickening within the right sphenoid sinus. Paranasal sinuses are otherwise clear. Small right mastoid effusion noted. Other: None. ASPECTS [facility] Stroke Program Early CT Score) - Ganglionic level infarction (caudate, lentiform nuclei, internal capsule, insula, M1-M3 cortex): 7 - Supraganglionic infarction (M4-M6 cortex): 3 Total score (0-10 with 10 being normal): 10 IMPRESSION: 1. No acute intracranial infarct or other abnormality identified. 2. ASPECTS is 10. 3. Multiple chronic ischemic infarcts involving the left basal angling the and bilateral occipital lobes. 4. Underlying age-related cerebral atrophy with moderate chronic small vessel ischemic disease. These results were communicated to Dr. Amada Jupiter at 8:42 pmon 5/28/2020by text page via the [facility] messaging system. Electronically Signed   By: Rise Mu M.D.   On: 10/03/2018 20:44   Vas US Carotid (at [facility] Only)  Result Date: 10/04/2018 Carotid Arterial Duplex Study Indications:  CVA. Risk Factors: Hypertension, Diabetes. Performing Technologist: Blanch Media RVS  Examination  Guidelines: A complete evaluation includes B-mode imaging, spectral Doppler, color Doppler, and power Doppler as needed of all accessible portions of each vessel. Bilateral testing is considered an integral part of a complete examination. Limited examinations for reoccurring indications may be performed as noted.  Right Carotid Findings: +----------+--------+--------+--------+------------+--------+           PSV cm/sEDV cm/sStenosisDescribe    Comments +----------+--------+--------+--------+------------+--------+ CCA Prox  71      11              heterogenous         +----------+--------+--------+--------+------------+--------+ CCA Distal120     18              heterogenous         +----------+--------+--------+--------+------------+--------+ ICA Prox  61      12      1-39%   heterogenous         +----------+--------+--------+--------+------------+--------+ ICA Distal65      20                                   +----------+--------+--------+--------+------------+--------+ ECA  86                                           +----------+--------+--------+--------+------------+--------+ +----------+--------+-------+--------+-------------------+           PSV cm/sEDV cmsDescribeArm Pressure (mmHG) +----------+--------+-------+--------+-------------------+ [id number]                                         +----------+--------+-------+--------+-------------------+ +---------+--------+--+--------+-+---------+ VertebralPSV cm/s42EDV cm/s8Antegrade +---------+--------+--+--------+-+---------+  Left Carotid Findings: +----------+--------+--------+--------+------------+--------+           PSV cm/sEDV cm/sStenosisDescribe    Comments +----------+--------+--------+--------+------------+--------+ CCA Prox  73      16              heterogenous         +----------+--------+--------+--------+------------+--------+ CCA Distal53      10               heterogenous         +----------+--------+--------+--------+------------+--------+ ICA Prox  48      14      1-39%   heterogenous         +----------+--------+--------+--------+------------+--------+ ICA Distal59      16                                   +----------+--------+--------+--------+------------+--------+ ECA       87                                           +----------+--------+--------+--------+------------+--------+ +----------+--------+--------+--------+-------------------+ SubclavianPSV cm/sEDV cm/sDescribeArm Pressure (mmHG) +----------+--------+--------+--------+-------------------+           93                                          +----------+--------+--------+--------+-------------------+ +---------+--------+--+--------+-+---------+ VertebralPSV cm/s22EDV cm/s4Antegrade +---------+--------+--+--------+-+---------+  Summary: Right Carotid: Velocities in the right ICA are consistent with a 1-39% stenosis. Left Carotid: Velocities in the left ICA are consistent with a 1-39% stenosis. Vertebrals: Bilateral vertebral arteries demonstrate antegrade flow. *See table(s) above for measurements and observations.  Electronically signed by Delia Heady MD on 10/04/2018 at 1:29:58 PM.    Final     Microbiology: Recent Results (from the past 240 hour(s))  SARS Coronavirus 2 (CEPHEID - Performed in [facility] hospital lab), Hosp Order     Status: None   Collection Time: 10/03/18  9:53 PM  Result Value Ref Range Status   SARS Coronavirus 2 NEGATIVE NEGATIVE Final    Comment: (NOTE) If result is NEGATIVE SARS-CoV-2 target nucleic acids are NOT DETECTED. The SARS-CoV-2 RNA is generally detectable in upper and lower  respiratory specimens during the acute phase of infection. The lowest  concentration of SARS-CoV-2 viral copies this assay can detect is 250  copies / mL. A negative result does not preclude SARS-CoV-2 infection  and should not be used as  the sole basis for treatment or other  patient management decisions.  A negative result may occur with  improper specimen collection / handling, submission of specimen other  than nasopharyngeal swab, presence of viral mutation(s)  within the  areas targeted by this assay, and inadequate number of viral copies  (<250 copies / mL). A negative result must be combined with clinical  observations, patient history, and epidemiological information. If result is POSITIVE SARS-CoV-2 target nucleic acids are DETECTED. The SARS-CoV-2 RNA is generally detectable in upper and lower  respiratory specimens dur ing the acute phase of infection.  Positive  results are indicative of active infection with SARS-CoV-2.  Clinical  correlation with patient history and other diagnostic information is  necessary to determine patient infection status.  Positive results do  not rule out bacterial infection or co-infection with other viruses. If result is PRESUMPTIVE POSTIVE SARS-CoV-2 nucleic acids MAY BE PRESENT.   A presumptive positive result was obtained on the submitted specimen  and confirmed on repeat testing.  While 2019 novel coronavirus  (SARS-CoV-2) nucleic acids may be present in the submitted sample  additional confirmatory testing may be necessary for epidemiological  and / or clinical management purposes  to differentiate between  SARS-CoV-2 and other Sarbecovirus currently known to infect humans.  If clinically indicated additional testing with an alternate test  methodology [phone]) is advised. The SARS-CoV-2 RNA is generally  detectable in upper and lower respiratory sp ecimens during the acute  phase of infection. The expected result is Negative. Fact Sheet for Patients:  BoilerBrush.com.cy Fact Sheet for Healthcare Providers: https://pope.com/ This test is not yet approved or cleared by the [place] FDA and has been authorized for  detection and/or diagnosis of SARS-CoV-2 by FDA under an Emergency Use Authorization (EUA).  This EUA will remain in effect (meaning this test can be used) for the duration of the COVID-19 declaration under Section 564(b)(1) of the Act, 21 U.S.C. section 360bbb-3(b)(1), unless the authorization is terminated or revoked sooner. Performed at [facility], [address], [place], [place] [address]      Labs: Basic Metabolic Panel: Recent Labs  Lab 10/04/18 [phone] 10/05/18 0353 10/06/18 0437 10/07/18 0753 10/08/18 0337  NA 140 137 136 135 137  K 3.4* 3.9 4.1 3.8 3.6  CL 103 103 103 102 101  CO2 24 24 23 24 26   GLUCOSE 97 261* 279* 222* 262*  BUN 17 20 20 21 20   CREATININE 0.88 1.24* 1.05* 0.97 1.02*  CALCIUM 9.6 8.9 8.8* 9.3 9.2   Liver Function Tests: Recent Labs  Lab 10/03/18 2017  AST 15  ALT 12  ALKPHOS 96  BILITOT 0.5  PROT 7.0  ALBUMIN 3.7   No results for input(s): LIPASE, AMYLASE in the last 168 hours. No results for input(s): AMMONIA in the last 168 hours. CBC: Recent Labs  Lab 10/03/18 2017 10/03/18 2021 10/04/18 0432 10/07/18 0753 10/08/18 0337  WBC 10.6*  --  10.5  --  8.8  NEUTROABS 4.9  --   --   --   --   HGB 13.1 13.9 12.9 12.9 12.3  HCT 39.9 41.0 39.0 39.0 36.8  MCV 90.7  --  88.4  --  88.9  PLT 267  --  259  --  255   Cardiac Enzymes: No results for input(s): CKTOTAL, CKMB, CKMBINDEX, TROPONINI in the last 168 hours. BNP: BNP (last 3 results) No results for input(s): BNP in the last 8760 hours.  ProBNP (last 3 results) No results for input(s): PROBNP in the last 8760 hours.  CBG: Recent Labs  Lab 10/07/18 0605 10/07/18 1223 10/07/18 1542 10/07/18 2124 10/08/18 0608  GLUCAP 255* 227* 248* 395* 214*  Signed:  Edsel Petrin  [facility] 10/08/2018, 10:33 AM

## 2018-10-08 NOTE — H&P (Signed)
Physical Medicine and Rehabilitation Admission H&P    Chief Complaint  Patient presents with  . Code Stroke  Chief complaint: Right side weakness HPI: Casey Harrell is an 84 year old right-handed female with history of PAF maintained on Eliquis, history of CVA and received inpatient rehab services, diabetes mellitus, hypertension, COPD/asthma, peripheral neuropathy.  Per chart review patient lives with son and family.  Ambulates without assistive device.  1 level home 6 steps to entry.    Presented 10/03/2018 with right right side weakness and facial droop and mild slurred speech.  Cranial CT scan showed no acute abnormalities noted multiple chronic ischemic infarcts involving the left basal ganglia and the bilateral occipital lobes.  Patient did not receive TPA.  MRI positive for acute punctate 5 mm infarct in the right paramedial dorsal level pons.  MRA with no large vessel occlusion.  Echocardiogram with ejection fraction of 65% hyperdynamic systolic function.  Carotid Doppler with no ICA stenosis.  Neurology service is consulted patient remains on Eliquis as prior to admission.  Tolerating a regular consistency diet.  Therapy evaluations completed and patient was admitted for a comprehensive rehab program.  Review of Systems  Constitutional: Negative for chills and fever.  HENT: Negative for hearing loss.   Eyes: Negative for blurred vision and double vision.  Respiratory: Negative for cough and shortness of breath.   Cardiovascular: Positive for palpitations.  Gastrointestinal: Positive for constipation. Negative for heartburn, nausea and vomiting.       GERD  Genitourinary: Negative for dysuria, flank pain and hematuria.  Musculoskeletal: Positive for joint pain and myalgias.  Skin: Negative for rash.  Neurological: Positive for dizziness, speech change and focal weakness.  All other systems reviewed and are negative.  Past Medical History:  Diagnosis Date  . ASTHMA 07/15/2008  .  COPD (chronic obstructive pulmonary disease) (HCC)   . DIABETES-TYPE 2 07/15/2008  . DIABETIC PERIPHERAL NEUROPATHY 09/02/2009  . GERD 07/15/2008  . HYPERTENSION 07/15/2008  . Normal cardiac stress test    low risk Nuc 2009, Feb 2011  . Paroxysmal atrial fibrillation (HCC)   . PVD (peripheral vascular disease) (HCC) 06/18/09   RCE  . Stroke (HCC)   . Vertigo    Past Surgical History:  Procedure Laterality Date  . ABDOMINAL HYSTERECTOMY  1971  . APPENDECTOMY  1971  . CAROTID ENDARTERECTOMY  06/18/09   RCE  . CESAREAN SECTION    . colectomy and colostomy    . FLEXIBLE SIGMOIDOSCOPY     diverticulitis  . FOOT SURGERY     right foot X 2  . LAPAROTOMY     X 3 with adhesions lysis  . TONSILLECTOMY    . TUBAL LIGATION     Family History  Problem Relation Age of Onset  . Diabetes Mother   . Arthritis Mother   . Heart disease Mother   . Heart disease Father   . Heart disease Sister   . Heart disease Brother   . Cancer Brother 60       lung cancer  . Celiac disease Sister   . Stroke Maternal Grandfather   . Cancer Paternal Grandfather        lung  . Heart disease Sister   . Asthma Sister    Social History:  reports that she has never smoked. She has never used smokeless tobacco. She reports that she does not drink alcohol or use drugs. Allergies:  Allergies  Allergen Reactions  . Doxycycline Hyclate Nausea And Vomiting  .  Morphine Sulfate Other (See Comments)    Causes hallucinations  . Iohexol Hives    Patient stated in 2010 this caused HIVES   . Moxifloxacin Other (See Comments)    Reaction not readily recalled??   Medications Prior to Admission  Medication Sig Dispense Refill  . acetaminophen (TYLENOL) 325 MG tablet Take 325-650 mg by mouth every 6 (six) hours as needed (for pain).     Marland Kitchen amLODipine (NORVASC) 5 MG tablet Take 1 tablet (5 mg total) by mouth daily. 30 tablet 5  . aspirin EC 81 MG tablet Take 81 mg by mouth at bedtime.     Marland Kitchen atorvastatin (LIPITOR) 40  MG tablet TAKE 1 TABLET BY MOUTH EVERY DAY (Patient taking differently: Take 40 mg by mouth daily. ) 90 tablet 3  . BIOTIN PO Take 1 tablet by mouth daily.    Marland Kitchen CALCIUM PO Take 1 tablet by mouth daily.    . carvedilol (COREG) 6.25 MG tablet TAKE 1 TABLET BY MOUTH TWICE A DAY WITH MEALS (Patient taking differently: Take 6.25 mg by mouth 2 (two) times daily with a meal. ) 180 tablet 1  . Cholecalciferol (VITAMIN D3) 2000 units TABS Take 2,000 Units by mouth daily. Reported on 04/26/2015 30 tablet 0  . DULoxetine (CYMBALTA) 30 MG capsule TAKE 1 CAPSULE ( ) TWICE DAILY. (Patient taking differently: Take 30 mg by mouth 2 (two) times daily. ) 180 capsule 1  . ELIQUIS 5 MG TABS tablet TAKE 1 TABLET BY MOUTH TWICE A DAY (Patient taking differently: Take 5 mg by mouth 2 (two) times daily. ) 60 tablet 0  . HUMALOG KWIKPEN 100 UNIT/ML KwikPen INJECT 9-14 UNITS SUBCUTANEOUSLY TWICE DAILY WITH MEAL (9 UNITS BREAKFAST AND 14 UNITS AFTER SUPPER) (Patient taking differently: INJECT 9-14 UNITS SUBCUTANEOUSLY TWICE DAILY WITH MEAL (9 UNITS BREAKFAST AND 14 UNITS AFTER SUPPER)) 15 mL 1  . irbesartan (AVAPRO) 150 MG tablet TAKE 1 TABLET BY MOUTH EVERY DAY (Patient taking differently: Take 150 mg by mouth daily. ) 90 tablet 1  . LEVEMIR FLEXTOUCH 100 UNIT/ML Pen INJECT 40 UNITS INTO THE SKIN TWICE A DAY (Patient taking differently: Inject 40 Units into the skin 2 (two) times daily. ) 15 mL 1  . levothyroxine (SYNTHROID) 50 MCG tablet TAKE 1 TABLET BY MOUTH EVERY DAY BEFORE BREAKFAST (Patient taking differently: Take 50 mcg by mouth daily before breakfast. ) 90 tablet 0  . metFORMIN (GLUCOPHAGE) 500 MG tablet Take 1/2 tablet by mouth twice a day with a meal. (Patient taking differently: Take 250 mg by mouth 2 (two) times daily with a meal. ) 90 tablet 0  . pantoprazole (PROTONIX) 40 MG tablet TAKE 1 TABLET BY MOUTH TWICE A DAY (Patient taking differently: Take 40 mg by mouth 2 (two) times daily. ) 180 tablet 2  .  ACCU-CHEK AVIVA PLUS test strip TEST ONCE DAILY. DX E11.9 (Patient taking differently: 1 each by Other route daily. ) 100 each 3  . albuterol (ACCUNEB) 1.25 MG/3ML nebulizer solution Take 3 mLs (1.25 mg total) by nebulization every 4 (four) hours as needed for wheezing. Reported on 04/26/2015 (Patient not taking: Reported on 10/04/2018) 75 mL 12  . Albuterol Sulfate (PROAIR RESPICLICK) 108 (90 Base) MCG/ACT AEPB Inhale 2 puffs into the lungs every 4 (four) hours as needed. (Patient not taking: Reported on 10/04/2018) 1 each 1  . diclofenac sodium (VOLTAREN) 1 % GEL APPLY 2GRAMS TOPICALLY 4 TIMES DAILY TO RIGHT ANKLE (Patient not taking: Reported on 10/04/2018) 100 g 1  .  glucose blood (ACCU-CHEK AVIVA PLUS) test strip Test twice daily.  Dx e11.9 200 each 12  . Insulin Detemir (LEVEMIR FLEXTOUCH) 100 UNIT/ML Pen INJECT 40 UNITS INTO THE SKIN TWICE A DAY (Patient not taking: Reported on 10/04/2018) 3 mL 5  . ipratropium-albuterol (DUONEB) 0.5-2.5 (3) MG/3ML SOLN Take 3 mLs by nebulization every 6 (six) hours as needed (shortness of breath or wheezing). (Patient not taking: Reported on 10/04/2018) 360 mL 0  . LORazepam (ATIVAN) 0.5 MG tablet TAKE 1 TABLET (0.5 MG TOTAL) BY MOUTH EVERY 8 (EIGHT) HOURS AS NEEDED FOR ANXIETY. (Patient not taking: Reported on 10/04/2018) 20 tablet 0  . LYRICA 150 MG capsule TAKE ONE CAPSULE BY MOUTH EVERY DAY (Patient not taking: Reported on 10/04/2018) 90 capsule 1  . ondansetron (ZOFRAN-ODT) 4 MG disintegrating tablet Take 1 tablet (4 mg total) by mouth every 8 (eight) hours as needed for nausea or vomiting. (Patient not taking: Reported on 10/04/2018) 20 tablet 0  . ONE TOUCH ULTRA TEST test strip TEST TWICE DAILY 100 each 12  . traZODone (DESYREL) 50 MG tablet TAKE 1/2 TO 1 TABLET BY MOUTH AT BEDTIME AS NEEDED FOR SLEEP (Patient not taking: Reported on 10/04/2018) 90 tablet 1    Drug Regimen Review Drug regimen was reviewed and remains appropriate with no significant issues  identified  Home: Home Living Family/patient expects to be discharged to:: Private residence Living Arrangements: Children Available Help at Discharge: Available PRN/intermittently Type of Home: House Home Access: Stairs to enter Secretary/administrator of Steps: 7 Entrance Stairs-Rails: Can reach both Home Layout: One level Bathroom Shower/Tub: Health visitor: Standard Bathroom Accessibility: Yes Home Equipment: Environmental consultant - 2 wheels, Shower seat - built in, [place] - single point Additional Comments: pt's family available from 1:30 through the night, until morning when they leave for work   Functional History: Prior Function Level of Independence: Needs assistance Gait / Transfers Assistance Needed: pt was ambulating without AD; ADL's / Homemaking Assistance Needed: family members assist with IADL; pt reports she was independent with ADL  Functional Status:  Mobility: Bed Mobility Overal bed mobility: Needs Assistance Bed Mobility: Sit to Supine Supine to sit: HOB elevated, Min guard Sit to supine: Supervision General bed mobility comments: Supervision for safety for return to supine  Transfers Overall transfer level: Needs assistance Equipment used: 1 person hand held assist Transfers: Sit to/from Stand Sit to Stand: Min assist General transfer comment: Min A for lift assist and steadying assist.  Ambulation/Gait Ambulation/Gait assistance: Min assist, Mod assist Gait Distance (Feet): 75 Feet Assistive device: 1 person hand held assist, Rolling walker (2 wheeled) Gait Pattern/deviations: Step-through pattern, Decreased stride length, Shuffle General Gait Details: Min to mod a for steadying assist with HHA. Cues for increasing step height and length in RLE as pt tended to have shuffle type pattern in RLE.  Gait velocity: Decreased  Gait velocity interpretation: <1.31 ft/sec, indicative of household ambulator    ADL: ADL Overall ADL's : Needs  assistance/impaired Eating/Feeding: Set up, Sitting Grooming: Min guard, Standing Grooming Details (indicate cue type and reason): pt completed grooming while standing at sink level for 8 min Upper Body Bathing: Min guard, Minimal assistance Upper Body Bathing Details (indicate cue type and reason): pt faituges quickly minA after duration of time Lower Body Bathing: Min guard, Minimal assistance Upper Body Dressing : Min guard, Sitting Lower Body Dressing: Min guard, Sit to/from stand, Minimal assistance Lower Body Dressing Details (indicate cue type and reason): minA for stability in standing  Toilet Transfer: Minimal assistance, Ambulation, RW Toilet Transfer Details (indicate cue type and reason): minA for mobility and safe use of DME Toileting- Clothing Manipulation and Hygiene: Minimal assistance, Sit to/from stand Functional mobility during ADLs: Minimal assistance, Rolling walker, Cueing for safety General ADL Comments: minA for more dynamic level activities;pt requires support of sink and RW with ADL and functioanl mobility   Cognition: Cognition Overall Cognitive Status: Within Functional Limits for tasks assessed Arousal/Alertness: Awake/alert Orientation Level: Oriented X4 Cognition Arousal/Alertness: Awake/alert Behavior During Therapy: WFL for tasks assessed/performed Overall Cognitive Status: Within Functional Limits for tasks assessed General Comments: pt with appropriate concerns, wanting to look in the mirror to see how asymmetrical her face is   Physical Exam: Blood pressure 137/66, pulse 66, temperature 98.1 F (36.7 C), temperature source Oral, resp. rate 18, height 5\' 1"  (1.549 m), weight 56.7 kg, SpO2 96 %. Physical Exam  Constitutional: She is oriented to person, place, and time. She appears well-developed and well-nourished. No distress.  HENT:  Head: Normocephalic and atraumatic.  Eyes: Pupils are equal, round, and reactive to light. Conjunctivae are normal.   Cardiovascular: Normal rate and regular rhythm. Exam reveals no friction rub.  Murmur heard. Respiratory: Effort normal. No respiratory distress. She has no wheezes.  GI: Soft. She exhibits no distension. There is no abdominal tenderness.  Musculoskeletal:        General: No edema.     Comments: Mild pain along right neck, SCM, Trap with palpation  Neurological: She is alert and oriented to person, place, and time.  Patient is alert.  No acute distress.  Makes good eye contact with examiner and follows commands.  She provides her name, age and date of birth.  Fair awareness of deficits, right central 7 and tongue deviation, speech slurred. RUE 4/5 prox to distal. RLE 4/5. LUE and LLE 5/5. Reasonable sitting balance, decreased FTN, HTS on right. Intentional tremor bilaterally.   Skin: Skin is warm and dry.  Psychiatric: She has a normal mood and affect. Her behavior is normal. Judgment and thought content normal.    Results for orders placed or performed during the hospital encounter of 10/03/18 (from the past 48 hour(s))  Glucose, capillary     Status: Abnormal   Collection Time: 10/06/18 11:44 AM  Result Value Ref Range   Glucose-Capillary 314 (H) 70 - 99 mg/dL   Comment 1 Notify RN    Comment 2 Document in Chart   Glucose, capillary     Status: Abnormal   Collection Time: 10/06/18  4:14 PM  Result Value Ref Range   Glucose-Capillary 279 (H) 70 - 99 mg/dL   Comment 1 Notify RN    Comment 2 Document in Chart   Glucose, capillary     Status: Abnormal   Collection Time: 10/06/18  9:11 PM  Result Value Ref Range   Glucose-Capillary 349 (H) 70 - 99 mg/dL  Glucose, capillary     Status: Abnormal   Collection Time: 10/07/18  6:05 AM  Result Value Ref Range   Glucose-Capillary 255 (H) 70 - 99 mg/dL   Comment 1 Notify RN    Comment 2 Document in Chart   Hemoglobin and hematocrit, blood     Status: None   Collection Time: 10/07/18  7:53 AM  Result Value Ref Range   Hemoglobin 12.9  12.0 - 15.0 g/dL   HCT 91.439.0 78.236.0 - 95.646.0 %    Comment: Performed at [facility], [address] [place], [place]  [address]  Basic metabolic panel     Status: Abnormal   Collection Time: 10/07/18  7:53 AM  Result Value Ref Range   Sodium 135 135 - 145 mmol/L   Potassium 3.8 3.5 - 5.1 mmol/L   Chloride 102 98 - 111 mmol/L   CO2 24 22 - 32 mmol/L   Glucose, Bld 222 (H) 70 - 99 mg/dL   BUN 21 8 - 23 mg/dL   Creatinine, Ser 6.04 0.44 - 1.00 mg/dL   Calcium 9.3 8.9 - 54.0 mg/dL   GFR calc non Af Amer 55 (L) >60 mL/min   GFR calc Af Amer >60 >60 mL/min   Anion gap 9 5 - 15    Comment: Performed at [facility], [address], [place], [place] [address]  Glucose, capillary     Status: Abnormal   Collection Time: 10/07/18 12:23 PM  Result Value Ref Range   Glucose-Capillary 227 (H) 70 - 99 mg/dL   Comment 1 Notify RN    Comment 2 Document in Chart   Glucose, capillary     Status: Abnormal   Collection Time: 10/07/18  3:42 PM  Result Value Ref Range   Glucose-Capillary 248 (H) 70 - 99 mg/dL   Comment 1 Notify RN    Comment 2 Document in Chart   Glucose, capillary     Status: Abnormal   Collection Time: 10/07/18  9:24 PM  Result Value Ref Range   Glucose-Capillary 395 (H) 70 - 99 mg/dL   Comment 1 Notify RN    Comment 2 Document in Chart   CBC     Status: None   Collection Time: 10/08/18  3:37 AM  Result Value Ref Range   WBC 8.8 4.0 - 10.5 K/uL   RBC 4.14 3.87 - 5.11 MIL/uL   Hemoglobin 12.3 12.0 - 15.0 g/dL   HCT 14.7 82.9 - 56.2 %   MCV 88.9 80.0 - 100.0 fL   MCH 29.7 26.0 - 34.0 pg   MCHC 33.4 30.0 - 36.0 g/dL   RDW 13.0 86.5 - 78.4 %   Platelets 255 150 - 400 K/uL   nRBC 0.0 0.0 - 0.2 %    Comment: Performed at [facility], [address] [place], [place] [address]  Basic metabolic panel     Status: Abnormal   Collection Time: 10/08/18  3:37 AM  Result Value Ref Range   Sodium 137 135 - 145 mmol/L   Potassium 3.6 3.5 - 5.1 mmol/L   Chloride 101  98 - 111 mmol/L   CO2 26 22 - 32 mmol/L   Glucose, Bld 262 (H) 70 - 99 mg/dL   BUN 20 8 - 23 mg/dL   Creatinine, Ser 5.28 (H) 0.44 - 1.00 mg/dL   Calcium 9.2 8.9 - 41.3 mg/dL   GFR calc non Af Amer 52 (L) >60 mL/min   GFR calc Af Amer 60 (L) >60 mL/min   Anion gap 10 5 - 15    Comment: Performed at [facility], [address], [place], [place] [address]  Glucose, capillary     Status: Abnormal   Collection Time: 10/08/18  6:08 AM  Result Value Ref Range   Glucose-Capillary 214 (H) 70 - 99 mg/dL   No results found.     Medical Problem List and Plan: 1.  Right side weakness with facial droop secondary to acute punctate infarct right paramedial dorsal level of pons as well old cortical infarcts in the  occipital lobes more extensive on the left than on the right  -admit to inpatient rehab 2.  Antithrombotics: -DVT/anticoagulation: Eliquis  -antiplatelet therapy: N/A 3. Pain Management: Lyrica 150 mg daily, Cymbalta 30 mg twice daily 4. Mood: Provide emotional support  -antipsychotic agents: N/A 5. Neuropsych: This patient is capable of making decisions on his own behalf. 6. Skin/Wound Care: Routine skin checks 7. Fluids/Electrolytes/Nutrition: Routine in and outs with follow-up chemistries 8.  PAF.  Cardiac rate controlled at present, monitor with increased activity during therapy  -  Continue Eliquis 9.  Diabetes mellitus with peripheral neuropathy.  Hemoglobin A1c 9.6.  NovoLog 3 units 3 times daily, Levemir 10 units daily.  Check blood sugars before meals and at bedtime.  Diabetic teaching by team 10.  COPD/asthma.  Continue nebulizers as directed 11.  Hypothyroidism.  Synthroid 12.  GERD.  Protonix      Charlton Amor, PA-C 10/08/2018

## 2018-10-08 NOTE — PMR Pre-admission (Signed)
PMR Admission Coordinator Pre-Admission Assessment  Patient: Casey Harrell is an 84 y.o., female MRN: [id number] DOB: Aug 27, 1934 Height: _0  (154.9 cm) Weight: 56.7 kg  Insurance Information HMO:    PPO: yes     PCP:      IPA:      80/20:      OTHER: medicare advantage plan PRIMARY: [employer] medicare      Policy#: [id number]      Subscriber: pt CM Name: Romie Minus      Phone#: [phone] no ext given     Fax#: [phone] Pre-Cert#: [id number] approved for 7 days with f/u Sharee Pimple phone [phone]      Employer: n/a Benefits:  Phone #: [phone]     Name: 10/07/2018 Eff. Date: 05/08/2018     Deduct: none      Out of Pocket Max: $3900      Life Max: none CIR: $345 co pay per day days 1 until 5      SNF: no co pay days 1 until 20; $160 co pay per day days 21 until 45; no co pay days 46 until 100 Outpatient: $40 co pay per visit     Co-Pay: vitis per medical necessity Home Health: 100%      Co-Pay: visit per medical neccesity DME: 80%     Co-Pay: 20% Providers: in network  SECONDARY: none        Medicaid Application Date:       Case Manager:  Disability Application Date:       Case Worker:   The "Data Collection Information Summary" for patients in Inpatient Rehabilitation Facilities with attached "Privacy Act [place] Records" was provided and verbally reviewed with: Patient and Family  Emergency Contact Information Contact Information    Name Relation Home Work [place], [place] Daughter   [phone]   Bangs Jr,Clif[place] Son   [phone]   Adelie, [place] [phone]        Current Medical History  Patient Admitting Diagnosis: CVA  History of Present Illness: 84 year old right-handed female with history of PAF maintained on Eliquis, history of CVA and received inpatient rehab services, diabetes mellitus, hypertension, COPD/asthma, peripheral neuropathy. Presented 10/03/2018 with right side weakness and facial droop and mild slurred speech.  Cranial CT scan showed  no acute abnormalities noted multiple chronic ischemic infarcts involving the left basal ganglia and the bilateral occipital lobes.  Patient did not receive TPA.  MRI positive for acute punctate 5 mm infarct in the right paramedial dorsal level pons.  MRA with no large vessel occlusion.  Echocardiogram with ejection fraction of 65% hyperdynamic systolic function.  Carotid Doppler with no ICA stenosis.  Neurology service is consulted patient remains on Eliquis as prior to admission.  Tolerating a regular consistency diet.    Complete NIHSS TOTAL: 4  Patient's medical record from [facility]  has been reviewed by the rehabilitation admission coordinator and physician.  Past Medical History  Past Medical History:  Diagnosis Date  . ASTHMA 07/15/2008  . COPD (chronic obstructive pulmonary disease) ([place])   . DIABETES-TYPE 2 07/15/2008  . DIABETIC PERIPHERAL NEUROPATHY 09/02/2009  . GERD 07/15/2008  . HYPERTENSION 07/15/2008  . Normal cardiac stress test    low risk Nuc 2009, Feb 2011  . Paroxysmal atrial fibrillation ([facility])   . PVD (peripheral vascular disease) ([place]) 06/18/09   RCE  . Stroke ([place])   . Vertigo     Family History   family history includes Arthritis in her mother;  Asthma in her sister; Cancer in her paternal grandfather; Cancer (age of onset: 70) in her brother; Celiac disease in her sister; Diabetes in her mother; Heart disease in her brother, father, mother, sister, and sister; Stroke in her maternal grandfather.  Prior Rehab/Hospitalizations Has the patient had prior rehab or hospitalizations prior to admission? Yes  Has the patient had major surgery during 100 days prior to admission? No   Current Medications  Current Facility-Administered Medications:  .  acetaminophen (TYLENOL) tablet 650 mg, 650 mg, Oral, Q4H PRN, 650 mg at 10/07/18 2027 **OR** acetaminophen (TYLENOL) solution 650 mg, 650 mg, Per Tube, Q4H PRN **OR** acetaminophen (TYLENOL) suppository 650 mg, 650  mg, Rectal, Q4H PRN, Patel, Vishal R, MD .  albuterol (PROVENTIL) (2.5 MG/3ML) 0.083% nebulizer solution 2.5 mg, 2.5 mg, Inhalation, Q4H PRN, Zada Finders R, MD .  apixaban (ELIQUIS) tablet 5 mg, 5 mg, Oral, BID, Zada Finders R, MD, 5 mg at 10/08/18 0924 .  atorvastatin (LIPITOR) tablet 20 mg, 20 mg, Oral, Daily, Metzger-Cihelka, Desiree, NP, 20 mg at 10/08/18 0925 .  diclofenac sodium (VOLTAREN) 1 % transdermal gel 2 g, 2 g, Topical, QID, Mikhail, [place], DO, 2 g at 10/08/18 3832 .  DULoxetine (CYMBALTA) DR capsule 30 mg, 30 mg, Oral, BID, Zada Finders R, MD, 30 mg at 10/08/18 0925 .  fluticasone (FLONASE) 50 MCG/ACT nasal spray 1 spray, 1 spray, Each Nare, Daily, Mikhail, [place], DO, 1 spray at 10/08/18 [phone] .  insulin aspart (novoLOG) injection 0-9 Units, 0-9 Units, Subcutaneous, TID WC, Lenore Cordia, MD, 3 Units at 10/08/18 1157 .  insulin aspart (novoLOG) injection 3 Units, 3 Units, Subcutaneous, TID WC, Mikhail, [place], DO, 3 Units at 10/08/18 1158 .  insulin detemir (LEVEMIR) injection 15 Units, 15 Units, Subcutaneous, BID, Cristal Ford, DO, 15 Units at 10/08/18 1022 .  ipratropium-albuterol (DUONEB) 0.5-2.5 (3) MG/3ML nebulizer solution 3 mL, 3 mL, Nebulization, Q6H PRN, Zada Finders R, MD .  levothyroxine (SYNTHROID) tablet 50 mcg, 50 mcg, Oral, Q0600, Lenore Cordia, MD, 50 mcg at 10/08/18 0644 .  pantoprazole (PROTONIX) EC tablet 40 mg, 40 mg, Oral, BID, Zada Finders R, MD, 40 mg at 10/08/18 0925 .  pregabalin (LYRICA) capsule 150 mg, 150 mg, Oral, Daily, Zada Finders R, MD, 150 mg at 10/08/18 0924 .  senna-docusate (Senokot-S) tablet 1 tablet, 1 tablet, Oral, QHS PRN, Lenore Cordia, MD  Patients Current Diet:  Diet Order            Diet heart healthy/carb modified Room service appropriate? Yes with Assist; Fluid consistency: Thin  Diet effective now              Precautions / Restrictions Precautions Precautions: Fall Restrictions Weight Bearing  Restrictions: No   Has the patient had 2 or more falls or a fall with injury in the past year? No  Prior Activity Level Community (5-7x/wk): Independent  Prior Functional Level Self Care: Did the patient need help bathing, dressing, using the toilet or eating? Independent  Indoor Mobility: Did the patient need assistance with walking from room to room (with or without device)? Independent  Stairs: Did the patient need assistance with internal or external stairs (with or without device)? Independent  Functional Cognition: Did the patient need help planning regular tasks such as shopping or remembering to take medications? Needed some help  Home Assistive Devices / [place] Devices/Equipment: Gilford Rile (specify type) Home Equipment: Walker - 2 wheels, Shower seat - built in, [place] - single  point  Prior Device Use: Indicate devices/aids used by the patient prior to current illness, exacerbation or injury? None of the above  Current Functional Level Cognition  Arousal/Alertness: Awake/alert Overall Cognitive Status: Within Functional Limits for tasks assessed Orientation Level: Oriented X4 General Comments: pt with appropriate concerns, wanting to look in the mirror to see how asymmetrical her face is     Extremity Assessment (includes Sensation/Coordination)  Upper Extremity Assessment: Defer to OT evaluation  Lower Extremity Assessment: Generalized weakness    ADLs  Overall ADL's : Needs assistance/impaired Eating/Feeding: Set up, Sitting Grooming: Min guard, Standing Grooming Details (indicate cue type and reason): pt completed grooming while standing at sink level for 8 min Upper Body Bathing: Min guard, Minimal assistance Upper Body Bathing Details (indicate cue type and reason): pt faituges quickly minA after duration of time Lower Body Bathing: Min guard, Minimal assistance Upper Body Dressing : Min guard, Sitting Lower Body Dressing: Min guard, Sit to/from  stand, Minimal assistance Lower Body Dressing Details (indicate cue type and reason): minA for stability in standing  Toilet Transfer: Minimal assistance, Ambulation, RW Toilet Transfer Details (indicate cue type and reason): minA for mobility and safe use of DME Toileting- Clothing Manipulation and Hygiene: Minimal assistance, Sit to/from stand Functional mobility during ADLs: Minimal assistance, Rolling walker, Cueing for safety General ADL Comments: minA for more dynamic level activities;pt requires support of sink and RW with ADL and functioanl mobility     Mobility  Overal bed mobility: Needs Assistance Bed Mobility: Sit to Supine Supine to sit: HOB elevated, Min guard Sit to supine: Supervision General bed mobility comments: Supervision for safety for return to supine     Transfers  Overall transfer level: Needs assistance Equipment used: 1 person hand held assist Transfers: Sit to/from Stand Sit to Stand: Min assist General transfer comment: Min A for lift assist and steadying assist.     Ambulation / Gait / Stairs / Wheelchair Mobility  Ambulation/Gait Ambulation/Gait assistance: Min assist, Mod assist Gait Distance (Feet): 75 Feet Assistive device: 1 person hand held assist, Rolling walker (2 wheeled) Gait Pattern/deviations: Step-through pattern, Decreased stride length, Shuffle General Gait Details: Min to mod a for steadying assist with HHA. Cues for increasing step height and length in RLE as pt tended to have shuffle type pattern in RLE.  Gait velocity: Decreased  Gait velocity interpretation: <1.31 ft/sec, indicative of household ambulator    Posture / Balance Dynamic Sitting Balance Sitting balance - Comments: pt minor instability after adjusting socks;able to self correct Balance Overall balance assessment: Needs assistance Sitting-balance support: No upper extremity supported, Feet supported Sitting balance-Leahy Scale: Fair Sitting balance - Comments: pt minor  instability after adjusting socks;able to self correct Standing balance support: Single extremity supported, During functional activity Standing balance-Leahy Scale: Poor Standing balance comment: Reliant on UE and external support during mobility tasks.     Special needs/care consideration BiPAP/CPAP  N/a CPM  N/a Continuous Drip IV  N/a Dialysis  N/a Life Vest  N/a Oxygen  N/a Special Bed  N/a Trach Size  N/a Wound Vac n/a Skin  ecchymosis to bilateral hands Bowel mgmt:  Continent LBM 6/1 Bladder mgmt:  continent Diabetic mgmt: Hgb A1c 9.6 Behavioral consideration  N/a Chemo/radiation  N/a   Previous Home Environment  Living Arrangements: (lives with son, daughter in law and adult grand daughter)  Lives With: Son(son and his family since her spouse died) Available Help at Discharge: (alone 7 am until 3:30 pm) Type of Home:  House Home Layout: One level Home Access: Stairs to enter Entrance Stairs-Rails: Can reach both Entrance Stairs-Number of Steps: 7 Bathroom Shower/Tub: Multimedia programmer: Standard Bathroom Accessibility: Yes How Accessible: Accessible via walker [place]: No Additional Comments: pt's family available from 1:30 through the night, until morning when they leave for work  Discharge Living Setting Plans for Discharge Living Setting: Lives with (comment)(son and his family) Type of Home at Discharge: House Discharge Home Layout: One level Discharge Home Access: Stairs to enter Entrance Stairs-Rails: Right, Left, Can reach both Entrance Stairs-Number of Steps: 7 Discharge Bathroom Shower/Tub: Walk-in shower Discharge Bathroom Toilet: Standard Discharge Bathroom Accessibility: Yes How Accessible: Accessible via walker Does the patient have any problems obtaining your medications?: No  Social/Family/Support Systems Patient Roles: Parent Contact Information: son, [name], daughter, Lynelle Smoke, not involved Anticipated Caregiver: son  and his family Anticipated Caregiver's Contact Information: [phone] Ability/Limitations of Caregiver: son and family work days Caregiver Availability: Other (Comment) Discharge Plan Discussed with Primary Caregiver: Yes Is Caregiver In Agreement with Plan?: Yes Does Caregiver/Family have Issues with Lodging/Transportation while Pt is in Rehab?: No  Goals/Additional Needs Patient/Family Goal for Rehab: Mod I with PT, OT, and SLP Expected length of stay: ELOS 10 to 14 days Pt/Family Agrees to Admission and willing to participate: Yes Program Orientation Provided & Reviewed with Pt/Caregiver Including Roles  & Responsibilities: Yes  Barriers to Discharge: Decreased caregiver support  Decrease burden of Care through IP rehab admission: n/a  Possible need for SNF placement upon discharge:  N/a  Patient Condition: I have reviewed medical records from [facility] , spoken with CM, and patient and son. I met with patient at the bedside for inpatient rehabilitation assessment.  Patient will benefit from ongoing PT, OT and SLP, can actively participate in 3 hours of therapy a day 5 days of the week, and can make measurable gains during the admission.  Patient will also benefit from the coordinated team approach during an Inpatient Acute Rehabilitation admission.  The patient will receive intensive therapy as well as Rehabilitation physician, nursing, social worker, and care management interventions.  Due to bladder management, bowel management, safety, skin/wound care, disease management, medication administration and patient education the patient requires 24 hour a day rehabilitation nursing.  The patient is currently Min to mod assist with mobility and basic ADLs.  Discharge setting and therapy post discharge at home with home health is anticipated.  Patient has agreed to participate in the Acute Inpatient Rehabilitation Program and will admit today.  Preadmission Screen Completed By:   Cleatrice Burke RN MSN  10/08/2018 12:13 PM ______________________________________________________________________   Discussed status with Dr. Naaman Plummer  on  10/08/2018 at  1215 and received approval for admission today.  Admission Coordinator:  Cleatrice Burke, RN MSN time  [id number] Date 10/08/2018   Assessment/Plan: Diagnosis: right paramedian pontine infarct 1. Does the need for close, 24 hr/day Medical supervision in concert with the patient's rehab needs make it unreasonable for this patient to be served in a less intensive setting? Yes 2. Co-Morbidities requiring supervision/potential complications: COPD, DM2, htn, pvd, PAF 3. Due to bladder management, bowel management, safety, skin/wound care, disease management, medication administration, pain management and patient education, does the patient require 24 hr/day rehab nursing? Yes 4. Does the patient require coordinated care of a physician, rehab nurse, PT (1-2 hrs/day, 5 days/week), OT (1-2 hrs/day, 5 days/week) and SLP (1-2 hrs/day, 5 days/week) to address physical and functional deficits in the context of  the above medical diagnosis(es)? Yes Addressing deficits in the following areas: balance, endurance, locomotion, strength, transferring, bowel/bladder control, bathing, dressing, feeding, grooming, toileting, cognition, speech, swallowing and psychosocial support 5. Can the patient actively participate in an intensive therapy program of at least 3 hrs of therapy 5 days a week? Yes 6. The potential for patient to make measurable gains while on inpatient rehab is excellent 7. Anticipated functional outcomes upon discharge from inpatients are: modified independent PT, modified independent OT, modified independent SLP 8. Estimated rehab length of stay to reach the above functional goals is: 10-14 days 9. Anticipated D/C setting: Home 10. Anticipated post D/C treatments: Irena therapy 11. Overall Rehab/Functional Prognosis: excellent  MD  Signature: Meredith Staggers, MD, [facility] 10/08/2018

## 2018-10-08 NOTE — Progress Notes (Signed)
Inpatient Rehabilitation Admissions Coordinator  I have insurance approval and bed available to CIR today. I have notified Dr. Catha Gosselin, her son, Ephriam Knuckles and will notify RN CM and SW. I will make the arrangements to admit today.  Ottie Glazier, RN, MSN Rehab Admissions Coordinator [phone] 10/08/2018 10:32 AM

## 2018-10-08 NOTE — Progress Notes (Signed)
Meredith Staggers, MD  Physician  Physical Medicine and Rehabilitation  PMR Pre-admission  Signed  Date of Service:  10/08/2018 12:04 PM       Related encounter: ED to Hosp-Admission (Current) from 10/03/2018 in [facility]      Signed         Show:Clear all '[x]'$ Manual'[x]'$ Template'[x]'$ Copied  Added by: '[x]'$ Cristina Gong, RN'[x]'$ Meredith Staggers, MD  '[]'$ Hover for details PMR Admission Coordinator Pre-Admission Assessment  Patient: Casey Harrell is an 84 y.o., female MRN: [id number] DOB: January 10, 1935 Height: '5\' 1"'$  (154.9 cm) Weight: 56.7 kg  Insurance Information HMO:    PPO: yes     PCP:      IPA:      80/20:      OTHER: medicare advantage plan PRIMARY: [employer] medicare      Policy#: [id number]      Subscriber: pt CM Name: Romie Minus      Phone#: [phone] no ext given     Fax#: [phone] Pre-Cert#: [id number] approved for 7 days with f/u Sharee Pimple phone [phone]      Employer: n/a Benefits:  Phone #: [phone]     Name: 10/07/2018 Eff. Date: 05/08/2018     Deduct: none      Out of Pocket Max: $3900      Life Max: none CIR: $345 co pay per day days 1 until 5      SNF: no co pay days 1 until 20; $160 co pay per day days 21 until 45; no co pay days 46 until 100 Outpatient: $40 co pay per visit     Co-Pay: vitis per medical necessity Home Health: 100%      Co-Pay: visit per medical neccesity DME: 80%     Co-Pay: 20% Providers: in network  SECONDARY: none        Medicaid Application Date:       Case Manager:  Disability Application Date:       Case Worker:   The "Data Collection Information Summary" for patients in Inpatient Rehabilitation Facilities with attached "Privacy Act [place] Records" was provided and verbally reviewed with: Patient and Family  Emergency Contact Information         Contact Information    Name Relation Home Work [place], [place] Daughter   [phone]   Skellenger Jr,Clifton Son   [phone]   Ammie, Warrick [phone]        Current Medical History  Patient Admitting Diagnosis: CVA  History of Present Illness: 84 year old right-handed female with history of PAF maintained on Eliquis, history of CVAand received inpatient rehab services, diabetes mellitus, hypertension, COPD/asthma, peripheral neuropathy. Presented 10/03/2018 with right side weakness and facial droopand mild slurred speech. Cranial CT scan showed no acute abnormalities noted multiple chronic ischemic infarcts involving the left basal ganglia and the bilateral occipital lobes. Patient did not receive TPA. MRI positive for acute punctate 5 mm infarct in the right paramedial dorsal level pons. MRA with no large vessel occlusion. Echocardiogram with ejection fraction of 65% hyperdynamic systolic function. Carotid Doppler with no ICA stenosis. Neurology service is consulted patient remains on Eliquis as prior to admission. Tolerating a regular consistency diet.   Complete NIHSS TOTAL: 4  Patient's medical record from [facility]  has been reviewed by the rehabilitation admission coordinator and physician.  Past Medical History      Past Medical History:  Diagnosis Date  . ASTHMA 07/15/2008  . COPD (chronic obstructive pulmonary disease) ([place])   .  DIABETES-TYPE 2 07/15/2008  . DIABETIC PERIPHERAL NEUROPATHY 09/02/2009  . GERD 07/15/2008  . HYPERTENSION 07/15/2008  . Normal cardiac stress test    low risk Nuc 2009, Feb 2011  . Paroxysmal atrial fibrillation (HCC)   . PVD (peripheral vascular disease) ([place]) 06/18/09   RCE  . Stroke ([place])   . Vertigo     Family History   family history includes Arthritis in her mother; Asthma in her sister; Cancer in her paternal grandfather; Cancer (age of onset: 82) in her brother; Celiac disease in her sister; Diabetes in her mother; Heart disease in her brother, father, mother, sister, and sister; Stroke in her maternal grandfather.  Prior  Rehab/Hospitalizations Has the patient had prior rehab or hospitalizations prior to admission? Yes  Has the patient had major surgery during 100 days prior to admission? No              Current Medications  Current Facility-Administered Medications:  .  acetaminophen (TYLENOL) tablet 650 mg, 650 mg, Oral, Q4H PRN, 650 mg at 10/07/18 2027 **OR** acetaminophen (TYLENOL) solution 650 mg, 650 mg, Per Tube, Q4H PRN **OR** acetaminophen (TYLENOL) suppository 650 mg, 650 mg, Rectal, Q4H PRN, Patel, Vishal R, MD .  albuterol (PROVENTIL) (2.5 MG/3ML) 0.083% nebulizer solution 2.5 mg, 2.5 mg, Inhalation, Q4H PRN, Zada Finders R, MD .  apixaban (ELIQUIS) tablet 5 mg, 5 mg, Oral, BID, Zada Finders R, MD, 5 mg at 10/08/18 0924 .  atorvastatin (LIPITOR) tablet 20 mg, 20 mg, Oral, Daily, Metzger-Cihelka, Desiree, NP, 20 mg at 10/08/18 0925 .  diclofenac sodium (VOLTAREN) 1 % transdermal gel 2 g, 2 g, Topical, QID, Mikhail, [place], DO, 2 g at 10/08/18 4782 .  DULoxetine (CYMBALTA) DR capsule 30 mg, 30 mg, Oral, BID, Zada Finders R, MD, 30 mg at 10/08/18 0925 .  fluticasone (FLONASE) 50 MCG/ACT nasal spray 1 spray, 1 spray, Each Nare, Daily, Mikhail, [place], DO, 1 spray at 10/08/18 [phone] .  insulin aspart (novoLOG) injection 0-9 Units, 0-9 Units, Subcutaneous, TID WC, Lenore Cordia, MD, 3 Units at 10/08/18 1157 .  insulin aspart (novoLOG) injection 3 Units, 3 Units, Subcutaneous, TID WC, Mikhail, [place], DO, 3 Units at 10/08/18 1158 .  insulin detemir (LEVEMIR) injection 15 Units, 15 Units, Subcutaneous, BID, Cristal Ford, DO, 15 Units at 10/08/18 1022 .  ipratropium-albuterol (DUONEB) 0.5-2.5 (3) MG/3ML nebulizer solution 3 mL, 3 mL, Nebulization, Q6H PRN, Zada Finders R, MD .  levothyroxine (SYNTHROID) tablet 50 mcg, 50 mcg, Oral, Q0600, Lenore Cordia, MD, 50 mcg at 10/08/18 0644 .  pantoprazole (PROTONIX) EC tablet 40 mg, 40 mg, Oral, BID, Zada Finders R, MD, 40 mg at 10/08/18 0925 .   pregabalin (LYRICA) capsule 150 mg, 150 mg, Oral, Daily, Zada Finders R, MD, 150 mg at 10/08/18 0924 .  senna-docusate (Senokot-S) tablet 1 tablet, 1 tablet, Oral, QHS PRN, Lenore Cordia, MD  Patients Current Diet:     Diet Order                  Diet heart healthy/carb modified Room service appropriate? Yes with Assist; Fluid consistency: Thin  Diet effective now               Precautions / Restrictions Precautions Precautions: Fall Restrictions Weight Bearing Restrictions: No   Has the patient had 2 or more falls or a fall with injury in the past year? No  Prior Activity Level Community (5-7x/wk): Independent  Prior Functional Level Self Care: Did the patient  need help bathing, dressing, using the toilet or eating? Independent  Indoor Mobility: Did the patient need assistance with walking from room to room (with or without device)? Independent  Stairs: Did the patient need assistance with internal or external stairs (with or without device)? Independent  Functional Cognition: Did the patient need help planning regular tasks such as shopping or remembering to take medications? Needed some help  Home Assistive Devices / [place] Devices/Equipment: Gilford Rile (specify type) Home Equipment: Walker - 2 wheels, Shower seat - built in, [place] - single point  Prior Device Use: Indicate devices/aids used by the patient prior to current illness, exacerbation or injury? None of the above  Current Functional Level Cognition  Arousal/Alertness: Awake/alert Overall Cognitive Status: Within Functional Limits for tasks assessed Orientation Level: Oriented X4 General Comments: pt with appropriate concerns, wanting to look in the mirror to see how asymmetrical her face is     Extremity Assessment (includes Sensation/Coordination)  Upper Extremity Assessment: Defer to OT evaluation  Lower Extremity Assessment: Generalized weakness    ADLs   Overall ADL's : Needs assistance/impaired Eating/Feeding: Set up, Sitting Grooming: Min guard, Standing Grooming Details (indicate cue type and reason): pt completed grooming while standing at sink level for 8 min Upper Body Bathing: Min guard, Minimal assistance Upper Body Bathing Details (indicate cue type and reason): pt faituges quickly minA after duration of time Lower Body Bathing: Min guard, Minimal assistance Upper Body Dressing : Min guard, Sitting Lower Body Dressing: Min guard, Sit to/from stand, Minimal assistance Lower Body Dressing Details (indicate cue type and reason): minA for stability in standing  Toilet Transfer: Minimal assistance, Ambulation, RW Toilet Transfer Details (indicate cue type and reason): minA for mobility and safe use of DME Toileting- Clothing Manipulation and Hygiene: Minimal assistance, Sit to/from stand Functional mobility during ADLs: Minimal assistance, Rolling walker, Cueing for safety General ADL Comments: minA for more dynamic level activities;pt requires support of sink and RW with ADL and functioanl mobility     Mobility  Overal bed mobility: Needs Assistance Bed Mobility: Sit to Supine Supine to sit: HOB elevated, Min guard Sit to supine: Supervision General bed mobility comments: Supervision for safety for return to supine     Transfers  Overall transfer level: Needs assistance Equipment used: 1 person hand held assist Transfers: Sit to/from Stand Sit to Stand: Min assist General transfer comment: Min A for lift assist and steadying assist.     Ambulation / Gait / Stairs / Wheelchair Mobility  Ambulation/Gait Ambulation/Gait assistance: Min assist, Mod assist Gait Distance (Feet): 75 Feet Assistive device: 1 person hand held assist, Rolling walker (2 wheeled) Gait Pattern/deviations: Step-through pattern, Decreased stride length, Shuffle General Gait Details: Min to mod a for steadying assist with HHA. Cues for increasing step  height and length in RLE as pt tended to have shuffle type pattern in RLE.  Gait velocity: Decreased  Gait velocity interpretation: <1.31 ft/sec, indicative of household ambulator    Posture / Balance Dynamic Sitting Balance Sitting balance - Comments: pt minor instability after adjusting socks;able to self correct Balance Overall balance assessment: Needs assistance Sitting-balance support: No upper extremity supported, Feet supported Sitting balance-Leahy Scale: Fair Sitting balance - Comments: pt minor instability after adjusting socks;able to self correct Standing balance support: Single extremity supported, During functional activity Standing balance-Leahy Scale: Poor Standing balance comment: Reliant on UE and external support during mobility tasks.     Special needs/care consideration BiPAP/CPAP  N/a CPM  N/a Continuous Drip  IV  N/a Dialysis  N/a Life Vest  N/a Oxygen  N/a Special Bed  N/a Trach Size  N/a Wound Vac n/a Skin  ecchymosis to bilateral hands Bowel mgmt:  Continent LBM 6/1 Bladder mgmt:  continent Diabetic mgmt: Hgb A1c 9.6 Behavioral consideration  N/a Chemo/radiation  N/a   Previous Home Environment  Living Arrangements: (lives with son, daughter in Sports coach and adult grand daughter)  Lives With: Son(son and his family since her spouse died) Available Help at Discharge: (alone 7 am until 3:30 pm) Type of Home: House Home Layout: One level Home Access: Stairs to enter Entrance Stairs-Rails: Can reach both Technical brewer of Steps: 7 Bathroom Shower/Tub: Multimedia programmer: Standard Bathroom Accessibility: Yes How Accessible: Accessible via walker Home Care Services: No Additional Comments: pt's family available from 1:30 through the night, until morning when they leave for work  Discharge Living Setting Plans for Discharge Living Setting: Lives with (comment)(son and his family) Type of Home at Discharge: House Discharge Home  Layout: One level Discharge Home Access: Stairs to enter Entrance Stairs-Rails: Right, Left, Can reach both Entrance Stairs-Number of Steps: 7 Discharge Bathroom Shower/Tub: Walk-in shower Discharge Bathroom Toilet: Standard Discharge Bathroom Accessibility: Yes How Accessible: Accessible via walker Does the patient have any problems obtaining your medications?: No  Social/Family/Support Systems Patient Roles: Parent Contact Information: son, [name], daughter, Lynelle Smoke, not involved Anticipated Caregiver: son and his family Anticipated Caregiver's Contact Information: [phone] Ability/Limitations of Caregiver: son and family work days Caregiver Availability: Other (Comment) Discharge Plan Discussed with Primary Caregiver: Yes Is Caregiver In Agreement with Plan?: Yes Does Caregiver/Family have Issues with Lodging/Transportation while Pt is in Rehab?: No  Goals/Additional Needs Patient/Family Goal for Rehab: Mod I with PT, OT, and SLP Expected length of stay: ELOS 10 to 14 days Pt/Family Agrees to Admission and willing to participate: Yes Program Orientation Provided & Reviewed with Pt/Caregiver Including Roles  & Responsibilities: Yes  Barriers to Discharge: Decreased caregiver support  Decrease burden of Care through IP rehab admission: n/a  Possible need for SNF placement upon discharge:  N/a  Patient Condition: I have reviewed medical records from [facility] , spoken with CM, and patient and son. I met with patient at the bedside for inpatient rehabilitation assessment.  Patient will benefit from ongoing PT, OT and SLP, can actively participate in 3 hours of therapy a day 5 days of the week, and can make measurable gains during the admission.  Patient will also benefit from the coordinated team approach during an Inpatient Acute Rehabilitation admission.  The patient will receive intensive therapy as well as Rehabilitation physician, nursing, social worker, and care  management interventions.  Due to bladder management, bowel management, safety, skin/wound care, disease management, medication administration and patient education the patient requires 24 hour a day rehabilitation nursing.  The patient is currently Min to mod assist with mobility and basic ADLs.  Discharge setting and therapy post discharge at home with home health is anticipated.  Patient has agreed to participate in the Acute Inpatient Rehabilitation Program and will admit today.  Preadmission Screen Completed By:  Cleatrice Burke RN MSN  10/08/2018 12:13 PM ______________________________________________________________________   Discussed status with Dr. Naaman Plummer  on  10/08/2018 at  1215 and received approval for admission today.  Admission Coordinator:  Cleatrice Burke, RN MSN time  1287 Date 10/08/2018   Assessment/Plan: Diagnosis: right paramedian pontine infarct 1. Does the need for close, 24 hr/day Medical supervision in concert with the  patient's rehab needs make it unreasonable for this patient to be served in a less intensive setting? Yes 2. Co-Morbidities requiring supervision/potential complications: COPD, DM2, htn, pvd, PAF 3. Due to bladder management, bowel management, safety, skin/wound care, disease management, medication administration, pain management and patient education, does the patient require 24 hr/day rehab nursing? Yes 4. Does the patient require coordinated care of a physician, rehab nurse, PT (1-2 hrs/day, 5 days/week), OT (1-2 hrs/day, 5 days/week) and SLP (1-2 hrs/day, 5 days/week) to address physical and functional deficits in the context of the above medical diagnosis(es)? Yes Addressing deficits in the following areas: balance, endurance, locomotion, strength, transferring, bowel/bladder control, bathing, dressing, feeding, grooming, toileting, cognition, speech, swallowing and psychosocial support 5. Can the patient actively participate in an intensive  therapy program of at least 3 hrs of therapy 5 days a week? Yes 6. The potential for patient to make measurable gains while on inpatient rehab is excellent 7. Anticipated functional outcomes upon discharge from inpatients are: modified independent PT, modified independent OT, modified independent SLP 8. Estimated rehab length of stay to reach the above functional goals is: 10-14 days 9. Anticipated D/C setting: Home 10. Anticipated post D/C treatments: [place] therapy 11. Overall Rehab/Functional Prognosis: excellent  MD Signature: Meredith Staggers, MD, [place] [facility] 10/08/2018         Revision History

## 2018-10-09 ENCOUNTER — Inpatient Hospital Stay (HOSPITAL_COMMUNITY): Payer: Medicare Other | Admitting: Physical Therapy

## 2018-10-09 ENCOUNTER — Inpatient Hospital Stay (HOSPITAL_COMMUNITY): Payer: Medicare Other | Admitting: Occupational Therapy

## 2018-10-09 ENCOUNTER — Inpatient Hospital Stay (HOSPITAL_COMMUNITY): Payer: Medicare Other | Admitting: Speech Pathology

## 2018-10-09 DIAGNOSIS — I480 Paroxysmal atrial fibrillation: Secondary | ICD-10-CM

## 2018-10-09 DIAGNOSIS — N179 Acute kidney failure, unspecified: Secondary | ICD-10-CM

## 2018-10-09 DIAGNOSIS — E119 Type 2 diabetes mellitus without complications: Secondary | ICD-10-CM

## 2018-10-09 DIAGNOSIS — I10 Essential (primary) hypertension: Secondary | ICD-10-CM

## 2018-10-09 LAB — CBC WITH DIFFERENTIAL/PLATELET
Abs Immature Granulocytes: 0.04 10*3/uL (ref 0.00–0.07)
Basophils Absolute: 0.1 10*3/uL (ref 0.0–0.1)
Basophils Relative: 1 %
Eosinophils Absolute: 0.5 10*3/uL (ref 0.0–0.5)
Eosinophils Relative: 5 %
HCT: 38.2 % (ref 36.0–46.0)
Hemoglobin: 12.8 g/dL (ref 12.0–15.0)
Immature Granulocytes: 0 %
Lymphocytes Relative: 30 %
Lymphs Abs: 2.9 10*3/uL (ref 0.7–4.0)
MCH: 30 pg (ref 26.0–34.0)
MCHC: 33.5 g/dL (ref 30.0–36.0)
MCV: 89.7 fL (ref 80.0–100.0)
Monocytes Absolute: 0.9 10*3/uL (ref 0.1–1.0)
Monocytes Relative: 9 %
Neutro Abs: 5.3 10*3/uL (ref 1.7–7.7)
Neutrophils Relative %: 55 %
Platelets: 261 10*3/uL (ref 150–400)
RBC: 4.26 MIL/uL (ref 3.87–5.11)
RDW: 13.5 % (ref 11.5–15.5)
WBC: 9.6 10*3/uL (ref 4.0–10.5)
nRBC: 0 % (ref 0.0–0.2)

## 2018-10-09 LAB — COMPREHENSIVE METABOLIC PANEL
ALT: 13 U/L (ref 0–44)
AST: 12 U/L — ABNORMAL LOW (ref 15–41)
Albumin: 3.5 g/dL (ref 3.5–5.0)
Alkaline Phosphatase: 88 U/L (ref 38–126)
Anion gap: 10 (ref 5–15)
BUN: 18 mg/dL (ref 8–23)
CO2: 28 mmol/L (ref 22–32)
Calcium: 9.7 mg/dL (ref 8.9–10.3)
Chloride: 100 mmol/L (ref 98–111)
Creatinine, Ser: 1.08 mg/dL — ABNORMAL HIGH (ref 0.44–1.00)
GFR calc Af Amer: 56 mL/min — ABNORMAL LOW (ref >60)
GFR calc non Af Amer: 48 mL/min — ABNORMAL LOW (ref >60)
Glucose, Bld: 230 mg/dL — ABNORMAL HIGH (ref 70–99)
Potassium: 4.3 mmol/L (ref 3.5–5.1)
Sodium: 138 mmol/L (ref 135–145)
Total Bilirubin: 0.6 mg/dL (ref 0.3–1.2)
Total Protein: 6.7 g/dL (ref 6.5–8.1)

## 2018-10-09 LAB — GLUCOSE, CAPILLARY
Glucose-Capillary: 202 mg/dL — ABNORMAL HIGH (ref 70–99)
Glucose-Capillary: 258 mg/dL — ABNORMAL HIGH (ref 70–99)
Glucose-Capillary: 184 mg/dL — ABNORMAL HIGH (ref 70–99)
Glucose-Capillary: 281 mg/dL — ABNORMAL HIGH (ref 70–99)

## 2018-10-09 NOTE — Progress Notes (Signed)
[place] PHYSICAL MEDICINE & REHABILITATION PROGRESS NOTE  Subjective/Complaints: Patient seen sitting up in bed working with therapy this morning.  She states she slept well overnight, except for having a fall.  Discussed with therapies.  She is ready to begin therapies.  ROS: Denies chest pain, shortness of breath, nausea, vomiting, diarrhea.  Objective: Vital Signs: Blood pressure (!) 144/61, pulse 69, temperature 98.4 F (36.9 C), temperature source Oral, resp. rate 17, height 5\' 1"  (1.549 m), weight 60.4 kg, SpO2 94 %. Ct Head Wo Contrast  Result Date: 10/08/2018 CLINICAL DATA:  Head injury after fall. EXAM: CT HEAD WITHOUT CONTRAST TECHNIQUE: Contiguous axial images were obtained from the base of the skull through the vertex without intravenous contrast. COMPARISON:  CT scan of Oct 03, 2018. FINDINGS: Brain: Mild diffuse cortical atrophy is noted. Mild chronic ischemic white matter disease is noted. No mass effect or midline shift is noted. Ventricular size is within normal limits. There is no evidence of mass lesion, hemorrhage or acute infarction. Vascular: No hyperdense vessel or unexpected calcification. Skull: Normal. Negative for fracture or focal lesion. Sinuses/Orbits: No acute finding. Other: None. IMPRESSION: Mild diffuse cortical atrophy. Mild chronic ischemic white matter disease. No acute intracranial abnormality seen. Electronically Signed   By: Lupita RaiderJames  Green Jr M.D.   On: 10/08/2018 21:48   Recent Labs    10/08/18 0337 10/09/18 0624  WBC 8.8 9.6  HGB 12.3 12.8  HCT 36.8 38.2  PLT 255 261   Recent Labs    10/08/18 0337 10/09/18 0624  NA 137 138  K 3.6 4.3  CL 101 100  CO2 26 28  GLUCOSE 262* 230*  BUN 20 18  CREATININE 1.02* 1.08*  CALCIUM 9.2 9.7    Physical Exam: BP (!) 144/61 (BP Location: Right Arm)   Pulse 69   Temp 98.4 F (36.9 C) (Oral)   Resp 17   Ht 5\' 1"  (1.549 m)   Wt 60.4 kg   SpO2 94%   BMI 25.16 kg/m  Constitutional: No distress .  Vital signs reviewed. HENT: Normocephalic.  Atraumatic. Eyes: No discharge.  Does not move eyes past midline to the right visual fields Cardiovascular: No JVD. Respiratory: Normal effort. GI: Non-distended. Musc: No edema or tenderness in extremities. Neurological: Alert and oriented  Makes good eye contact with examiner and follows commands.  Fair awareness of deficits Right facial weakness Motor: RUE/RLE 4+/5 prox to distal.  LUE/LLE 5/5. Reasonable sitting balance, decreased FTN, HTS on right. Intentional tremor bilaterally.  Left pupil dilated Skin: Skin iswarmand dry.  Psychiatric: She has anormal mood and affect. Herbehavior is normal.Judgmentand thought contentnormal.   Assessment/Plan: 1. Functional deficits secondary to right paramedial dorsal pons punctate infarct which require 3+ hours per day of interdisciplinary therapy in a comprehensive inpatient rehab setting.  Physiatrist is providing close team supervision and 24 hour management of active medical problems listed below.  Physiatrist and rehab team continue to assess barriers to discharge/monitor patient progress toward functional and medical goals  Care Tool:  Bathing              Bathing assist       Upper Body Dressing/Undressing Upper body dressing Upper body dressing/undressing activity did not occur (including orthotics): N/A      Upper body assist      Lower Body Dressing/Undressing Lower body dressing    Lower body dressing activity did not occur: N/A       Lower body assist  Toileting Toileting    Toileting assist Assist for toileting: Independent     Transfers Chair/bed transfer  Transfers assist     Chair/bed transfer assist level: Contact Guard/Touching assist     Locomotion Ambulation   Ambulation assist              Walk 10 feet activity   Assist           Walk 50 feet activity   Assist           Walk 150 feet  activity   Assist           Walk 10 feet on uneven surface  activity   Assist           Wheelchair     Assist               Wheelchair 50 feet with 2 turns activity    Assist            Wheelchair 150 feet activity     Assist            Medical Problem List and Plan: 1.Right side weakness with facial droopsecondary to acute punctate infarct right paramedial dorsal level of pons as well old cortical infarcts in the occipital lobes more extensive on the left than on the right  Begin CIR evaluations  Notes reviewed-stroke with history of stroke, images reviewed- acute small right brainstem infarct on MRI, repeat CT overnight reviewed fall- no acute changes, labs reviewed  Team conference today to discuss current and goals and coordination of care, home and environmental barriers, and discharge planning with nursing, case manager, and therapies.  2. Antithrombotics: -DVT/anticoagulation:Eliquis -antiplatelet therapy: N/A 3. Pain Management:Lyrica 150 mg daily, Cymbalta 30 mg twice daily 4. Mood:Provide emotional support -antipsychotic agents: N/A 5. Neuropsych: This patientiscapable of making decisions on hisown behalf. 6. Skin/Wound Care:Routine skin checks 7. Fluids/Electrolytes/Nutrition:Routine in and outs  8.PAF. Cardiac rate controlled at present, monitor with increased activity during therapy  Monitor with increased mobility 9.Diabetes mellitus. Hemoglobin A1c 9.6. NovoLog 3 units 3 times daily, Levemir 10 units daily. Check blood sugars before meals and at bedtime. Diabetic teaching by team  Monitor with increased mobility 10. COPD/asthma. Continue nebulizers as directed 11. Hypothyroidism. Synthroid 12. GERD. Protonix 13.  AKI versus CKD  Creatinine 1.08 on 6/3  Continue to monitor 14.  Hypertension: ?  Reactive  Monitor with increased mobility   LOS: 1 days A FACE TO FACE  EVALUATION WAS PERFORMED  Aneudy Champlain Karis Juba 10/09/2018, 10:09 AM

## 2018-10-09 NOTE — Progress Notes (Signed)
Patient information reviewed and entered into eRehab System by Becky Paulena Servais, PPS coordinator. Information including medical coding, function ability, and quality indicators will be reviewed and updated through discharge.   

## 2018-10-09 NOTE — Plan of Care (Signed)
  Problem: Consults Goal: RH STROKE PATIENT EDUCATION Description See Patient Education module for education specifics  Outcome: Progressing Goal: Nutrition Consult-if indicated Outcome: Progressing   Problem: RH BOWEL ELIMINATION Goal: RH STG MANAGE BOWEL WITH ASSISTANCE Description STG Manage Bowel with mod I Assistance.  Outcome: Progressing   Problem: RH SKIN INTEGRITY Goal: RH STG MAINTAIN SKIN INTEGRITY WITH ASSISTANCE Description STG Maintain Skin Integrity With mod I  Assistance.  Outcome: Progressing   Problem: RH PAIN MANAGEMENT Goal: RH STG PAIN MANAGED AT OR BELOW PT'S PAIN GOAL Description Pain scale <4/10  Outcome: Progressing   Problem: RH BOWEL ELIMINATION Goal: RH STG MANAGE BOWEL W/MEDICATION W/ASSISTANCE Description STG Manage Bowel with Medication with Assistance. Outcome: Progressing   Problem: RH SKIN INTEGRITY Goal: RH STG SKIN FREE OF INFECTION/BREAKDOWN Outcome: Progressing   Problem: RH PAIN MANAGEMENT Goal: RH STG PAIN MANAGED AT OR BELOW PT'S PAIN GOAL Description Pain scale <4/10  Outcome: Progressing

## 2018-10-09 NOTE — Progress Notes (Signed)
At approximately 2002 I was called to the room due to patient falling.  Upon my arrival in the room patient is alert and oriented x 4 and moving all extremities equally and vital signs are WDL.  NT reports she assisted patient to the bathroom and when they arrived back at the bed the patient was on the right side of the bed and the NT went to the left side of the bed to straighten the bed sheets so the patient could get in bed and the patient lost her balance and felt backwards.  Patients attempted to grab the bedside table which knocked the meal tray top on to the floor which caused the patient hit her head on the meal tray top when she fell.  Patient was assisted back in bed and denies injury.  I notified the Charge RN and Jacalyn Lefevre, NP of the above.  Riley Lam gave an order for a CT due to patient hitting her head and being at risk for bleeding due to being on an anticoagulant.  Patient refused to allow me to call her family and notify them of the fall.  Patient was taken to CT with RN at bedside as ordered.  Patient was brought back to her room and remains alert and oriented x 4 and able to move all extremities equally.  See flowsheet for more vitals sign and assessment information.      10/08/18 2030  What Happened  Was fall witnessed? Yes  Who witnessed fall? Ann, NT  Patients activity before fall to/from bed, chair, or stretcher  Point of contact head  Was patient injured? No  Follow Up  MD notified Jacalyn Lefevre, NP  Time MD notified 2020  Family notified No- patient refusal  Additional tests Yes-comment (CT ordered)  Pain Assessment  Pain Score 0  Neurological  Neuro (WDL) X  Level of Consciousness Alert  Orientation Level Oriented X4  Cognition Appropriate at baseline  Speech Clear  Pupil Assessment  Yes  R Pupil Size (mm) 4  R Pupil Shape Round  R Pupil Reaction Brisk  L Pupil Size (mm) 4  L Pupil Shape Round  L Pupil Reaction Brisk  Additional Pupil Assessments No   Facial Symmetry Asymmetrical right  Facial Droop Right  R Hand Grip Strong  L Hand Grip Strong  R Foot Dorsiflexion Strong  L Foot Dorsiflexion Strong  R Foot Plantar Flexion Strong  L Foot Plantar Flexion Strong  RUE Motor Response Purposeful movement  RUE Sensation Full sensation  RUE Motor Strength 5  LUE Motor Response Purposeful movement  LUE Sensation Full sensation  LUE Motor Strength 5  RLE Motor Response Purposeful movement  RLE Sensation Full sensation  RLE Motor Strength 5  LLE Motor Response Purposeful movement  LLE Sensation Full sensation  LLE Motor Strength 5  Musculoskeletal  Musculoskeletal (WDL) X  Generalized Weakness Yes  Integumentary  Integumentary (WDL) X  Skin Color Appropriate for ethnicity  Skin Condition Dry  Skin Integrity Ecchymosis  Ecchymosis Location Arm;Leg  Ecchymosis Location Orientation Right;Left  Skin Turgor Non-tenting

## 2018-10-09 NOTE — Progress Notes (Signed)
Nutrition Brief Note  Patient identified on the Malnutrition Screening Tool (MST) Report.  Spoke with pt via phone call to room. Pt reports that her appetite is excellent and states that the food at [facility] "is the best food I've ever had, and I eat all of it!" Pt states she is having no issues in terms of PO intake.  Pt reports losing weight several years ago when she had her first stroke and after the death of her husband. Pt believes she weighs about 125 lbs now. Measured weight from 6/02 recorded as 133 lbs. Pt states that her weight has been stable over the last 1-2 years and that "it was a while ago, I can't remember when exactly" she lost weight.  RD reviewed weight history in chart. Pt's weight has been fairly stable over the last 2 years with some fluctuations.  Pt declines any oral nutrition supplements or other nutrition interventions, states she is doing well right now. RD encouraged pt to reach out if issues arise.  Wt Readings from Last 15 Encounters:  10/08/18 60.4 kg  10/03/18 56.7 kg  06/24/18 60.1 kg  01/03/18 60.3 kg  10/15/17 60.7 kg  08/28/17 60.6 kg  04/23/17 62.8 kg  04/11/17 64 kg  03/30/17 65.3 kg  11/09/16 59 kg  10/31/16 59.3 kg  08/11/16 62 kg  06/28/16 63 kg  04/26/16 62.9 kg  03/07/16 62.6 kg    Body mass index is 25.16 kg/m. Patient meets criteria for overweight based on current BMI.   Current diet order is Heart Healthy/Carb Modified, patient is consuming approximately 90% of meals at this time. Labs and medications reviewed.   No nutrition interventions warranted at this time. If nutrition issues arise, please consult RD.    Earma Reading, MS, RD, LDN Inpatient Clinical Dietitian Pager: [phone] Weekend/After Hours: [phone]

## 2018-10-09 NOTE — Evaluation (Signed)
Physical Therapy Assessment and Plan  Patient Details  Name: Casey Harrell MRN: [id number] Date of Birth: 1934/10/28  PT Diagnosis: Abnormality of gait, Ataxia, Ataxic gait, Coordination disorder, Difficulty walking, Hemiplegia  and Muscle weakness Rehab Potential: Good ELOS: 10-14 days   Today's Date: 10/09/2018 PT Individual Time: 1000-1109 PT Individual Time Calculation (min): 69 min    Problem List:  Patient Active Problem List   Diagnosis Date Noted  . AKI (acute kidney injury) (HCC)   . Diabetes mellitus type 2 in nonobese (HCC)   . PAF (paroxysmal atrial fibrillation) (HCC)   . Reactive hypertension   . Right pontine CVA (HCC) 10/08/2018  . COPD (chronic obstructive pulmonary disease) (HCC) 10/03/2018  . 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 Left pontine cerebrovascular accident (HCC) 04/03/2017  . History of CVA with residual deficit   . Type 2 diabetes mellitus with peripheral neuropathy (HCC)   . Anemia 03/30/2017  . History of stroke   . Chest pain 02/09/2016  . BPPV (benign paroxysmal positional vertigo) 08/25/2015  . HLD (hyperlipidemia) 08/25/2015  . At high risk for falls 07/01/2015  . Vertigo 06/21/2015  . Paroxysmal atrial fibrillation (HCC) 06/11/2015  . Cerebral infarction due to stenosis of left middle cerebral artery (HCC) 05/20/2015  . Intracranial vascular stenosis 05/20/2015  . Type 2 diabetes mellitus with circulatory disorder (HCC) 05/20/2015  . Type 2 diabetes, uncontrolled, with neuropathy (HCC)   . Abdominal pain   . Partial small bowel obstruction (HCC)   . Benign essential HTN   . Small bowel obstruction (HCC) 05/07/2015  . Hemiplegia, unspecified affecting right dominant side (HCC) 04/08/2015  . Embolic stroke involving posterior cerebral artery (HCC) 04/05/2015  . Orthostatic hypotension   . Hypertensive urgency 04/01/2015  . Fever in adult 04/01/2015  . Acute encephalopathy 04/01/2015  . Confusion   . CVA (cerebral  infarction)   . Posterior circulation stroke (HCC)   . Acute ischemic stroke (HCC) 03/31/2015  . Stroke (HCC) 03/31/2015  . CKD (chronic kidney disease) stage 3, GFR 30-59 ml/min (HCC) 04/17/2014  . Gastroenteritis, non-infectious 04/11/2014  . Nausea vomiting and diarrhea 04/10/2014  . Hypokalemia 04/10/2014  . Dehydration 04/09/2014  . Bronchitis with airway obstruction (HCC) 02/03/2013  . Leukocytosis 02/03/2013  . Fever 02/03/2013  . Acute respiratory failure with hypoxia (HCC) 02/03/2013  . Depression 02/03/2013  . Hyponatremia 02/03/2013  . Obesity (BMI 30-39.9) 01/10/2013  . PVD (peripheral vascular disease)- S/P RCE 06/18/09 12/13/2012  . Sleep apnea- C-pap intol 12/13/2012  . Low risk cardiac nuclear stress test- 2009 and Feb 2011 12/13/2012  . Mild persistent asthma 11/29/2011  . Chronic cough 06/05/2011  . Diabetic neuropathy (HCC) 09/02/2009  . Dyslipidemia 07/13/2009  . Hypothyroidism 11/12/2008  . Type 2 diabetes mellitus, uncontrolled (HCC) 07/15/2008  . Essential hypertension 07/15/2008  . GERD 07/15/2008  . DIVERTICULOSIS, COLON 07/15/2008    Past Medical History:  Past Medical History:  Diagnosis Date  . ASTHMA 07/15/2008  . COPD (chronic obstructive pulmonary disease) (HCC)   . DIABETES-TYPE 2 07/15/2008  . DIABETIC PERIPHERAL NEUROPATHY 09/02/2009  . GERD 07/15/2008  . HYPERTENSION 07/15/2008  . Normal cardiac stress test    low risk Nuc 2009, Feb 2011  . Paroxysmal atrial fibrillation (HCC)   . PVD (peripheral vascular disease) (HCC) 06/18/09   RCE  . Stroke (HCC)   . Vertigo    Past Surgical History:  Past Surgical History:  Procedure Laterality Date  . ABDOMINAL HYSTERECTOMY  1971  .  APPENDECTOMY  1971  . CAROTID ENDARTERECTOMY  06/18/09   RCE  . CESAREAN SECTION    . colectomy and colostomy    . FLEXIBLE SIGMOIDOSCOPY     diverticulitis  . FOOT SURGERY     right foot X 2  . LAPAROTOMY     X 3 with adhesions lysis  . TONSILLECTOMY    .  TUBAL LIGATION      Assessment & Plan Clinical Impression: Patient is a 84 year old right-handed female with history of PAF maintained on Eliquis, history of CVAand received inpatient rehab services, diabetes mellitus, hypertension, COPD/asthma, peripheral neuropathy. Per chart review patient lives with son and family. Ambulates without assistive device. 1 level home 6 steps to entry. Presented 10/03/2018 with right right side weakness and facial droopand mild slurred speech. Cranial CT scan showed no acute abnormalities noted multiple chronic ischemic infarcts involving the left basal ganglia and the bilateral occipital lobes. Patient did not receive TPA. MRI positive for acute punctate 5 mm infarct in the right paramedial dorsal level pons. MRA with no large vessel occlusion. Echocardiogram with ejection fraction of 65% hyperdynamic systolic function. Carotid Doppler with no ICA stenosis. Neurology service is consulted patient remains on Eliquis as prior to admission. Tolerating a regular consistency diet. Patient transferred to CIR on 10/08/2018 .   Patient currently requires min with mobility secondary to muscle weakness, decreased cardiorespiratoy endurance, unbalanced muscle activation, ataxia and decreased coordination, decreased visual motor skills and decreased visual acuity (blurriness) R visual field only and decreased sitting balance, decreased standing balance, decreased postural control, hemiplegia and decreased balance strategies.  Prior to hospitalization, patient was independent  with mobility and lived with Son in a House home.  Home access is 5-8 steps, cannot remember Stairs to enter.  Patient will benefit from skilled PT intervention to maximize safe functional mobility, minimize fall risk and decrease caregiver burden for planned discharge home with intermittent assist.  Anticipate patient will benefit from follow up [facility] at discharge.  PT - End of Session Activity  Tolerance: Tolerates 10 - 20 min activity with multiple rests Endurance Deficit: Yes Endurance Deficit Description: decreased PT Assessment Rehab Potential (ACUTE/IP ONLY): Good PT Barriers to Discharge: Decreased caregiver support PT Barriers to Discharge Comments: son and daughter-in-law gone during weekdays 7:30-1:30 PT Patient demonstrates impairments in the following area(s): Balance;Endurance;Motor;Safety;Perception PT Transfers Functional Problem(s): Bed Mobility;Furniture;Floor;Bed to Chair;Car PT Locomotion Functional Problem(s): Ambulation;Stairs PT Plan PT Intensity: Minimum of 1-2 x/day ,45 to 90 minutes PT Frequency: 5 out of 7 days PT Duration Estimated Length of Stay: 10-14 days PT Treatment/Interventions: Ambulation/gait training;Balance/vestibular training;Community reintegration;Discharge planning;Disease management/prevention;DME/adaptive equipment instruction;Functional electrical stimulation;Functional mobility training;Neuromuscular re-education;Patient/family education;Pain management;Psychosocial support;Skin care/wound management;Splinting/orthotics;Stair training;Therapeutic Activities;Therapeutic Exercise;UE/LE Strength taining/ROM;UE/LE Coordination activities;Visual/perceptual remediation/compensation PT Transfers Anticipated Outcome(s): mod/i  PT Locomotion Anticipated Outcome(s): mod/i household gait  PT Recommendation Follow Up Recommendations: Home health PT Patient destination: Home(lives w/ son) Equipment Recommended: To be determined Equipment Details: has RW and w/c already   Skilled Therapeutic Intervention  Pt in supine and agreeable to therapy, denies pain. Bed mobility as detailed below and transferred to w/c via stand pivot w/ min assist. Total assist w/c transport to/from therapy gym for time management. Performed gait w/ and w/o AD and stair negotiation as detailed below. Gait min assist w/o RW 50' and CGA w/ RW 150'.  Additionally performed car  transfer w/ min assist.  Instructed pt in results of PT evaluation as detailed below, PT POC, rehab potential, rehab goals, and discharge recommendations.  Worked on RLE NMR and balance while standing to perform taps to cones. Needed visual feedback of LEs to complete task safely, and therapist support of managing RW in stance. Min assist overall. Performed w/o therapist supporting RW, min assist as well. Ambulated 150' w/ RW after NMR, improved R foot clearance and control of step placement, however still needs verbal cues. Returned to room and assisted w/ toilet transfer, min assist for balance during transfer and while standing at sink to wash hands. Ended session in supine, all needs in reach.  PT Evaluation Precautions/Restrictions Precautions Precautions: Fall Restrictions Weight Bearing Restrictions: No General   Vital SignsTherapy Vitals Temp: 98.8 F (37.1 C) Temp Source: Oral Pulse Rate: 67 Resp: 18 BP: (!) 148/68 Patient Position (if appropriate): Sitting Oxygen Therapy SpO2: 93 % O2 Device: Room Air Pain Pain Assessment Pain Scale: 0-10 Pain Score: 0-No pain Home Living/Prior Functioning Home Living Available Help at Discharge: Family(lives with son and daughter-in-law, both work during day M-F 7:30-1:30) Type of Home: House Home Access: Stairs to enter [employer] of Steps: 5-8 steps, cannot remember  Entrance Stairs-Rails: Can reach both Home Layout: One level Bathroom Shower/Tub: Health visitor: Standard Bathroom Accessibility: Yes  Lives With: Son Prior Function Level of Independence: Independent with basic ADLs;Independent with gait;Independent with transfers;Independent with homemaking with ambulation(used RW few days prior to hospital admission 2/2 balance deficits, otherwise independent w/o AD)  Able to Take Stairs?: Yes Driving: No Vocation: Retired Vision/Perception  Vision - Naval architect: Within Functional  Limits Ocular Range of Motion: Restricted on the right Alignment/Gaze Preference: Within Defined Limits Tracking/Visual Pursuits: Right eye does not track laterally;Decreased smoothness of horizontal tracking;Decreased smoothness of vertical tracking;Decreased smoothness of eye movement to RIGHT superior field;Requires cues, head turns, or add eye shifts to track Additional Comments: Blurriness in R visual field, disappears w/ R eye closed  Perception Perception: Within Functional Limits Praxis Praxis: Intact  Cognition Overall Cognitive Status: Within Functional Limits for tasks assessed Arousal/Alertness: Awake/alert Orientation Level: Oriented X4 Attention: Alternating Alternating Attention: Appears intact Memory: Appears intact Awareness: Appears intact Safety/Judgment: Appears intact Comments: pt is aware of safe activities that she should and shouldn't perform while family is gone during day Sensation Sensation Light Touch: Appears Intact(BLEs) Coordination Gross Motor Movements are Fluid and Coordinated: No Heel Shin Test: mild dysmetria  Motor  Motor Motor: Hemiplegia;Ataxia Motor - Skilled Clinical Observations: Mild R hemi, mild ataxia w/ open chain and functional movements  Mobility Bed Mobility Bed Mobility: Rolling Right;Rolling Left;Supine to Sit;Sit to Supine Rolling Right: Supervision/verbal cueing Rolling Left: Supervision/Verbal cueing Supine to Sit: Supervision/Verbal cueing Sit to Supine: Supervision/Verbal cueing Transfers Transfers: Sit to Stand;Stand to Sit;Stand Pivot Transfers Sit to Stand: Minimal Assistance - Patient > 75% Stand to Sit: Minimal Assistance - Patient > 75% Stand Pivot Transfers: Minimal Assistance - Patient > 75% Stand Pivot Transfer Details: Manual facilitation for weight shifting;Manual facilitation for placement;Verbal cues for precautions/safety;Verbal cues for technique Transfer (Assistive device): None Locomotion   Gait Ambulation: Yes Gait Assistance: Minimal Assistance - Patient > 75% (CGA w/ RW)  Gait Distance (Feet): 50 Feet Assistive device: None Gait Assistance Details: Manual facilitation for weight shifting;Manual facilitation for placement Gait Gait: Yes Gait Pattern: Impaired Gait Pattern: Ataxic;Narrow base of support;Trunk flexed Gait velocity: Decreased  Stairs / Additional Locomotion Stairs: Yes Stairs Assistance: Minimal Assistance - Patient > 75% Stair Management Technique: Two rails Number of Stairs: 4 Height of Stairs: 6 Wheelchair Mobility Wheelchair Mobility: No  Trunk/Postural  Assessment  Cervical Assessment Cervical Assessment: Exceptions to WFL(forward head) Thoracic Assessment Thoracic Assessment: Exceptions to WFL(rounded shoulders) Lumbar Assessment Lumbar Assessment: Exceptions to WFL(posterior pelvic tilt) Postural Control Postural Control: Deficits on evaluation(delayed/insufficient, posterior lean bias)  Balance Balance Balance Assessed: Yes Static Sitting Balance Static Sitting - Balance Support: No upper extremity supported;Feet supported Static Sitting - Level of Assistance: 5: Stand by assistance Dynamic Sitting Balance Dynamic Sitting - Balance Support: No upper extremity supported;Feet supported Dynamic Sitting - Level of Assistance: 5: Stand by assistance Static Standing Balance Static Standing - Balance Support: No upper extremity supported;During functional activity Static Standing - Level of Assistance: 4: Min assist Dynamic Standing Balance Dynamic Standing - Balance Support: No upper extremity supported;During functional activity Dynamic Standing - Level of Assistance: 4: Min assist Extremity Assessment  RLE Assessment RLE Assessment: Exceptions to [facility] Passive Range of Motion (PROM) Comments: WFL General Strength Comments: Globally 4/5  LLE Assessment LLE Assessment: Within Functional Limits    Refer to Care Plan for Long Term  Goals  Recommendations for other services: None   Discharge Criteria: Patient will be discharged from PT if patient refuses treatment 3 consecutive times without medical reason, if treatment goals not met, if there is a change in medical status, if patient makes no progress towards goals or if patient is discharged from hospital.  The above assessment, treatment plan, treatment alternatives and goals were discussed and mutually agreed upon: by patient  Aayushi Solorzano K Merrill Villarruel 10/09/2018, 11:10 AM

## 2018-10-09 NOTE — Evaluation (Signed)
Occupational Therapy Assessment and Plan  Patient Details  Name: Casey Harrell MRN: [id number] Date of Birth: 11-21-1934  OT Diagnosis: disturbance of vision, hemiplegia affecting dominant side and muscle weakness (generalized) Rehab Potential: Rehab Potential (ACUTE ONLY): Excellent ELOS: 10-14 days   Today's Date: 10/09/2018 OT Individual Time: 1330-1450 OT Individual Time Calculation (min): 80 min     Problem List:  Patient Active Problem List   Diagnosis Date Noted  . AKI (acute kidney injury) ([place])   . Diabetes mellitus type 2 in nonobese (HCC)   . PAF (paroxysmal atrial fibrillation) ([place])   . Reactive hypertension   . Right pontine CVA ([place]) 10/08/2018  . COPD (chronic obstructive pulmonary disease) ([place]) 10/03/2018  . 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 Left pontine cerebrovascular accident ([place]) 04/03/2017  . History of CVA with residual deficit   . Type 2 diabetes mellitus with peripheral neuropathy (HCC)   . Anemia 03/30/2017  . History of stroke   . Chest pain 02/09/2016  . BPPV (benign paroxysmal positional vertigo) 08/25/2015  . HLD (hyperlipidemia) 08/25/2015  . At high risk for falls 07/01/2015  . Vertigo 06/21/2015  . Paroxysmal atrial fibrillation ([place]) 06/11/2015  . Cerebral infarction due to stenosis of left middle cerebral artery ([place]) 05/20/2015  . Intracranial vascular stenosis 05/20/2015  . Type 2 diabetes mellitus with circulatory disorder ([place]) 05/20/2015  . Type 2 diabetes, uncontrolled, with neuropathy ([place])   . Abdominal pain   . Partial small bowel obstruction ([place])   . Benign essential HTN   . Small bowel obstruction ([place]) 05/07/2015  . Hemiplegia, unspecified affecting right dominant side ([place]) 04/08/2015  . Embolic stroke involving posterior cerebral artery ([place]) 04/05/2015  . Orthostatic hypotension   . Hypertensive urgency 04/01/2015  . Fever in adult 04/01/2015  . Acute encephalopathy 04/01/2015  . Confusion   . CVA  (cerebral infarction)   . Posterior circulation stroke ([place])   . Acute ischemic stroke ([place]) 03/31/2015  . Stroke ([place]) 03/31/2015  . CKD (chronic kidney disease) stage 3, GFR 30-59 ml/min (HCC) 04/17/2014  . Gastroenteritis, non-infectious 04/11/2014  . Nausea vomiting and diarrhea 04/10/2014  . Hypokalemia 04/10/2014  . Dehydration 04/09/2014  . Bronchitis with airway obstruction ([place]) 02/03/2013  . Leukocytosis 02/03/2013  . Fever 02/03/2013  . Acute respiratory failure with hypoxia ([place]) 02/03/2013  . Depression 02/03/2013  . Hyponatremia 02/03/2013  . Obesity (BMI 30-39.9) 01/10/2013  . PVD (peripheral vascular disease)- S/P RCE 06/18/09 12/13/2012  . Sleep apnea- C-pap intol 12/13/2012  . Low risk cardiac nuclear stress test- 2009 and Feb 2011 12/13/2012  . Mild persistent asthma 11/29/2011  . Chronic cough 06/05/2011  . Diabetic neuropathy ([place]) 09/02/2009  . Dyslipidemia 07/13/2009  . Hypothyroidism 11/12/2008  . Type 2 diabetes mellitus, uncontrolled ([place]) 07/15/2008  . Essential hypertension 07/15/2008  . GERD 07/15/2008  . DIVERTICULOSIS, COLON 07/15/2008    Past Medical History:  Past Medical History:  Diagnosis Date  . ASTHMA 07/15/2008  . COPD (chronic obstructive pulmonary disease) ([place])   . DIABETES-TYPE 2 07/15/2008  . DIABETIC PERIPHERAL NEUROPATHY 09/02/2009  . GERD 07/15/2008  . HYPERTENSION 07/15/2008  . Normal cardiac stress test    low risk Nuc 2009, Feb 2011  . Paroxysmal atrial fibrillation (HCC)   . PVD (peripheral vascular disease) ([place]) 06/18/09   RCE  . Stroke ([place])   . Vertigo    Past Surgical History:  Past Surgical History:  Procedure Laterality Date  . ABDOMINAL HYSTERECTOMY  1971  .  APPENDECTOMY  1971  . CAROTID ENDARTERECTOMY  06/18/09   RCE  . CESAREAN SECTION    . colectomy and colostomy    . FLEXIBLE SIGMOIDOSCOPY     diverticulitis  . FOOT SURGERY     right foot X 2  . LAPAROTOMY     X 3 with adhesions lysis  . TONSILLECTOMY     . TUBAL LIGATION      Assessment & Plan Clinical Impression: Patient is a 83 y.o. year old female with history of PAF maintained on Eliquis, history of CVAand received inpatient rehab services, diabetes mellitus, hypertension, COPD/asthma, peripheral neuropathy. Per chart review patient lives with son and family. Ambulates without assistive device. 1 level home 6 steps to entry. Presented 10/03/2018 with right right side weakness and facial droopand mild slurred speech. Cranial CT scan showed no acute abnormalities noted multiple chronic ischemic infarcts involving the left basal ganglia and the bilateral occipital lobes. Patient did not receive TPA. MRI positive for acute punctate 5 mm infarct in the right paramedial dorsal level pons. MRA with no large vessel occlusion. Echocardiogram with ejection fraction of 65% hyperdynamic systolic function. Carotid Doppler with no ICA stenosis. Neurology service is consulted patient remains on Eliquis as prior to admission. Tolerating a regular consistency diet.  Patient transferred to CIR on 10/08/2018 .    Patient currently requires min with basic self-care skills secondary to decreased coordination and decreased motor planning, decreased visual motor skills and decreased sitting balance, decreased standing balance, decreased postural control and decreased balance strategies.  Prior to hospitalization, patient could complete adl with independent .  Patient will benefit from skilled intervention to decrease level of assist with basic self-care skills and increase independence with basic self-care skills prior to discharge home with care partner.  Anticipate patient will require intermittent supervision and follow up outpatient.  OT - End of Session Activity Tolerance: Tolerates 30+ min activity with multiple rests Endurance Deficit: Yes Endurance Deficit Description: fatigue with adl tasks OT Assessment Rehab Potential (ACUTE ONLY):  Excellent OT Patient demonstrates impairments in the following area(s): Balance;Endurance;Motor;Vision OT Basic ADL's Functional Problem(s): Grooming;Eating;Dressing;Toileting;Bathing OT Advanced ADL's Functional Problem(s): Simple Meal Preparation OT Transfers Functional Problem(s): Tub/Shower OT Additional Impairment(s): Fuctional Use of Upper Extremity OT Plan OT Intensity: Minimum of 1-2 x/day, 45 to 90 minutes OT Frequency: 5 out of 7 days OT Duration/Estimated Length of Stay: 10-14 days OT Treatment/Interventions: Balance/vestibular training;Discharge planning;Self Care/advanced ADL retraining;Therapeutic Activities;UE/LE Coordination activities;Functional mobility training;Patient/family education;Therapeutic Exercise;Visual/perceptual remediation/compensation;DME/adaptive equipment instruction;Neuromuscular re-education OT Self Feeding Anticipated Outcome(s): independent OT Basic Self-Care Anticipated Outcome(s): mod I OT Toileting Anticipated Outcome(s): mod I OT Bathroom Transfers Anticipated Outcome(s): Superivsion OT Recommendation Patient destination: Home Follow Up Recommendations: Outpatient OT Equipment Recommended: To be determined Equipment Details: need to assess current shower seat dimesions   Skilled Therapeutic Intervention Patient pleasant and cooperative, ready for therapy session.  Evaluation completed as outlined below with review of role of OT, schedule, plan of care, safety and goals for therapy.  Patient demonstrates good understanding and safety t/o session.  She is limited by balance, endurance, coordination and visual motor deficits at this time.  Therapy session to include functional mobility with RW, functional transfers, ADL/shower and visual motor exercises.  Patient with occ LOB requiring CG/min A for functional mobility and transfers to/from toilet, bed, w/c and shower seat with back.  Shower performed seated on shower seat with back with min A for  steadying in stance and thoroughness of lower legs and buttocks.  UB dressing with set up/S, LB dressing with min A.  Grooming completed in stance at sink with CGA.  Provided eye patch due to limited eye closure of right eye and for use with reading activities.  Patient returned to bed for a rest break at close of session with bed alarm set and call bell in place.  OT Evaluation Precautions/Restrictions  Precautions Precautions: Fall Restrictions Weight Bearing Restrictions: No General Chart Reviewed: Yes Vital Signs Therapy Vitals Temp: 98.6 F (37 C) Temp Source: Oral Pulse Rate: 71 Resp: 18 BP: (!) 157/82 Patient Position (if appropriate): Sitting Oxygen Therapy SpO2: 100 % O2 Device: Room Air Pain Pain Assessment Pain Scale: 0-10 Pain Score: 0-No pain Home Living/Prior Functioning Home Living Family/patient expects to be discharged to:: Private residence Living Arrangements: Children Available Help at Discharge: Family Type of Home: House Home Access: Stairs to enter Technical brewer of Steps: 5-8 steps, cannot remember  Entrance Stairs-Rails: Can reach both Home Layout: One level Bathroom Shower/Tub: Multimedia programmer: Standard Bathroom Accessibility: Yes Additional Comments: pt's family available from 1:30 through the night, until morning when they leave for work  Lives With: Son Prior Function Level of Independence: Independent with basic ADLs, Independent with gait, Independent with transfers, Independent with homemaking with ambulation  Able to Take Stairs?: Yes Driving: No Vocation: Retired ADL ADL Eating: Supervision/safety Where Assessed-Eating: Wheelchair Grooming: Contact guard Where Assessed-Grooming: Standing at sink Upper Body Bathing: Supervision/safety Where Assessed-Upper Body Bathing: Shower Lower Body Bathing: Minimal assistance Where Assessed-Lower Body Bathing: Shower Upper Body Dressing: Supervision/safety Where  Assessed-Upper Body Dressing: Wheelchair Lower Body Dressing: Minimal assistance Where Assessed-Lower Body Dressing: Wheelchair Toileting: Contact guard Where Assessed-Toileting: Glass blower/designer: Therapist, music Method: Counselling psychologist: Energy manager: Curator Method: Heritage manager: Civil engineer, contracting with back Vision Baseline Vision/History: No visual deficits Patient Visual Report: Eye fatigue/eye pain/headache;Blurring of vision Vision Assessment?: Yes Eye Alignment: Impaired (comment)(able to align eyes with gaze to left, limited in other positions) Ocular Range of Motion: Restricted on the right;Restricted looking down Alignment/Gaze Preference: Head turned Tracking/Visual Pursuits: Right eye does not track laterally;Decreased smoothness of eye movement to RIGHT inferior field;Decreased smoothness of eye movement to RIGHT superior field;Requires cues, head turns, or add eye shifts to track;Decreased smoothness of horizontal tracking;Left eye does not track medially Saccades: Additional head turns occurred during testing;Decreased speed of saccadic movement Convergence: Impaired (comment) Visual Fields: No apparent deficits Diplopia Assessment: (patient denies diplopia  - unable to focus for brock string, possible suppression) Perception  Perception: Within Functional Limits Praxis Praxis: Intact Cognition Overall Cognitive Status: Within Functional Limits for tasks assessed Arousal/Alertness: Awake/alert Orientation Level: Person;Place;Situation Person: Oriented Place: Oriented Situation: Oriented Year: 2020 Month: June Day of Week: Correct Memory: Appears intact Immediate Memory Recall: Sock;Blue;Bed Memory Recall: Sock;Blue;Bed Memory Recall Sock: Without Cue Memory Recall Blue: With Cue Memory Recall Bed: Without Cue Attention: Alternating Awareness: Appears  intact Problem Solving: Appears intact Safety/Judgment: Appears intact Comments: pt is aware of safe activities that she should and shouldn't perform while family is gone during day Sensation Sensation Light Touch: Appears Intact Hot/Cold: Appears Intact Coordination Gross Motor Movements are Fluid and Coordinated: No Fine Motor Movements are Fluid and Coordinated: No Finger Nose Finger Test: mild dysmetria bilateral (hx of CVA with residual deficits Left side) 9 Hole Peg Test: R =48 sec, L = 45 sec Motor  Motor Motor: Hemiplegia;Ataxia Motor - Skilled Clinical Observations: Mild R  hemi, mild ataxia w/ open chain and functional movements Mobility  Transfers Sit to Stand: Minimal Assistance - Patient > 75% Stand to Sit: Minimal Assistance - Patient > 75%  Trunk/Postural Assessment  Postural Control Postural Control: Deficits on evaluation  Balance Static Sitting Balance Static Sitting - Level of Assistance: 5: Stand by assistance Dynamic Sitting Balance Dynamic Sitting - Balance Support: During functional activity Dynamic Sitting - Level of Assistance: 4: Min assist Static Standing Balance Static Standing - Balance Support: During functional activity Static Standing - Level of Assistance: 4: Min assist Dynamic Standing Balance Dynamic Standing - Balance Support: During functional activity Dynamic Standing - Level of Assistance: 4: Min assist Extremity/Trunk Assessment RUE Assessment RUE Assessment: Within Functional Limits Active Range of Motion (AROM) Comments: WFL t/o General Strength Comments: 4/5 t/o LUE Assessment LUE Assessment: Within Functional Limits Active Range of Motion (AROM) Comments: WFLs General Strength Comments: 4/5 t/o     Refer to Care Plan for Long Term Goals  Recommendations for other services: None    Discharge Criteria: Patient will be discharged from OT if patient refuses treatment 3 consecutive times without medical reason, if treatment  goals not met, if there is a change in medical status, if patient makes no progress towards goals or if patient is discharged from hospital.  The above assessment, treatment plan, treatment alternatives and goals were discussed and mutually agreed upon: by patient  Carlos Levering 10/09/2018, 3:25 PM

## 2018-10-09 NOTE — IPOC Note (Signed)
Individualized overall Plan of Care [facility](IPOC) Patient Details Name: Casey Harrell MRN: [id number] DOB: 1935/05/05  Admitting Diagnosis: Right pontine infarct  Hospital Problems: Active Problems:   Right pontine CVA (HCC)   AKI (acute kidney injury) (HCC)   Diabetes mellitus type 2 in nonobese (HCC)   PAF (paroxysmal atrial fibrillation) (HCC)   Reactive hypertension     Functional Problem List: Nursing Motor, Safety  PT Balance, Endurance, Motor, Safety, Perception  OT Balance, Endurance, Motor, Vision  SLP    TR         Basic ADL's: OT Grooming, Eating, Dressing, Toileting, Bathing     Advanced  ADL's: OT Simple Meal Preparation     Transfers: PT Bed Mobility, Furniture, Floor, Bed to Chair, BankerCar  OT Tub/Shower     Locomotion: PT Ambulation, Stairs     Additional Impairments: OT Fuctional Use of Upper Extremity  SLP None      TR      Anticipated Outcomes Item Anticipated Outcome  Self Feeding independent  Swallowing      Basic self-care  mod I  Toileting  mod I   Bathroom Transfers Superivsion  Bowel/Bladder  patient will remain continent of bowel & bladder while on IP Rehab  Transfers  mod/i   Locomotion  mod/i household gait   Communication     Cognition     Pain  Patients pain scale will be controlled at no more than a scale of 4/10  Safety/Judgment  Patient will have no falls with injury while on rehab   Therapy Plan: PT Intensity: Minimum of 1-2 x/day ,45 to 90 minutes PT Frequency: 5 out of 7 days PT Duration Estimated Length of Stay: 10-14 days OT Intensity: Minimum of 1-2 x/day, 45 to 90 minutes OT Frequency: 5 out of 7 days OT Duration/Estimated Length of Stay: 10-14 days      Team Interventions: Nursing Interventions Disease Management/Prevention, Medication Management, Pain Management, Discharge Planning  PT interventions Ambulation/gait training, Balance/vestibular training, Community reintegration, Discharge planning, Disease  management/prevention, DME/adaptive equipment instruction, Functional electrical stimulation, Functional mobility training, Neuromuscular re-education, Patient/family education, Pain management, Psychosocial support, Skin care/wound management, Splinting/orthotics, Stair training, Therapeutic Activities, Therapeutic Exercise, UE/LE Strength taining/ROM, UE/LE Coordination activities, Visual/perceptual remediation/compensation  OT Interventions Balance/vestibular training, Discharge planning, Self Care/advanced ADL retraining, Therapeutic Activities, UE/LE Coordination activities, Functional mobility training, Patient/family education, Therapeutic Exercise, Visual/perceptual remediation/compensation, DME/adaptive equipment instruction, Neuromuscular re-education  SLP Interventions    TR Interventions    SW/CM Interventions Discharge Planning, Psychosocial Support, Patient/Family Education   Barriers to Discharge MD  Medical stability and Neurogenic bowel and bladder  Nursing      PT Decreased caregiver support son and daughter-in-law gone during weekdays 7:30-1:30  OT      SLP      SW       Team Discharge Planning: Destination: PT-Home(lives w/ son) ,OT- Home , SLP-Home Projected Follow-up: PT-Home health PT, OT-  Outpatient OT, SLP-None Projected Equipment Needs: PT-To be determined, OT- To be determined, SLP-None recommended by SLP Equipment Details: PT-has RW and w/c already , OT-need to assess current shower seat dimesions Patient/family involved in discharge planning: PT- Patient,  OT-Patient, SLP-Patient  MD ELOS: 10-14 days. Medical Rehab Prognosis:  Good Assessment: 84 year old right-handed female with history of PAF maintained on Eliquis, history of CVAand received inpatient rehab services, diabetes mellitus, hypertension, COPD/asthma, peripheral neuropathy. Presented 10/03/2018 with right right side weakness and facial droopand mild slurred speech. Cranial CT scan showed no  acute abnormalities  noted multiple chronic ischemic infarcts involving the left basal ganglia and the bilateral occipital lobes. Patient did not receive TPA. MRI positive for acute punctate 5 mm infarct in the right paramedial dorsal level pons. MRA with no large vessel occlusion. Echocardiogram with ejection fraction of 65% hyperdynamic systolic function. Carotid Doppler with no ICA stenosis. Neurology service is consulted patient remains on Eliquis as prior to admission. Tolerating a regular consistency diet. Patient with resulting functional deficits with mobility and self-care.  We will set goals for mod I with PT/OT.  Due to the current state of emergency, patients may not be receiving their 3-hours of Medicare-mandated therapy.  See Team Conference Notes for weekly updates to the plan of care

## 2018-10-09 NOTE — Plan of Care (Signed)
  Problem: Consults Goal: RH STROKE PATIENT EDUCATION Description See Patient Education module for education specifics  10/09/2018 1153 by Esperanza Richters, RN Outcome: Progressing 10/09/2018 1058 by Esperanza Richters, RN Outcome: Progressing Goal: Nutrition Consult-if indicated 10/09/2018 1153 by Esperanza Richters, RN Outcome: Progressing 10/09/2018 1058 by Esperanza Richters, RN Outcome: Progressing Goal: Diabetes Guidelines if Diabetic/Glucose > 140 Description If diabetic or lab glucose is > 140 mg/dl - Initiate Diabetes/Hyperglycemia Guidelines & Document Interventions  Outcome: Progressing   Problem: RH BOWEL ELIMINATION Goal: RH STG MANAGE BOWEL WITH ASSISTANCE Description STG Manage Bowel with mod I Assistance.  10/09/2018 1153 by Esperanza Richters, RN Outcome: Progressing 10/09/2018 1058 by Esperanza Richters, RN Outcome: Progressing   Problem: RH SKIN INTEGRITY Goal: RH STG MAINTAIN SKIN INTEGRITY WITH ASSISTANCE Description STG Maintain Skin Integrity With mod I  Assistance.  10/09/2018 1153 by Esperanza Richters, RN Outcome: Progressing 10/09/2018 1058 by Esperanza Richters, RN Outcome: Progressing   Problem: RH SAFETY Goal: RH STG ADHERE TO SAFETY PRECAUTIONS W/ASSISTANCE/DEVICE Description STG Adhere to Safety Precautions With mod I Assistance/Device.  Outcome: Progressing Goal: RH STG DECREASED RISK OF FALL WITH ASSISTANCE Description STG Decreased Risk of Fall With mod I Assistance.  10/09/2018 1153 by Esperanza Richters, RN Outcome: Progressing 10/09/2018 1058 by Esperanza Richters, RN Outcome: Adequate for Discharge   Problem: RH PAIN MANAGEMENT Goal: RH STG PAIN MANAGED AT OR BELOW PT'S PAIN GOAL Description Pain scale <4/10  10/09/2018 1153 by Esperanza Richters, RN Outcome: Progressing 10/09/2018 1058 by Esperanza Richters, RN Outcome: Progressing   Problem: Consults Goal: Skin Care Protocol Initiated - if Braden Score 18 or less Description If consults are not indicated, leave blank or  document N/A Outcome: Progressing   Problem: RH BOWEL ELIMINATION Goal: RH STG MANAGE BOWEL W/MEDICATION W/ASSISTANCE Description STG Manage Bowel with Medication with Assistance. Outcome: Progressing   Problem: RH SKIN INTEGRITY Goal: RH STG SKIN FREE OF INFECTION/BREAKDOWN 10/09/2018 1153 by Esperanza Richters, RN Outcome: Progressing 10/09/2018 1058 by Esperanza Richters, RN Outcome: Progressing   Problem: RH SAFETY Goal: RH STG ADHERE TO SAFETY PRECAUTIONS W/ASSISTANCE/DEVICE Description STG Adhere to Safety Precautions With mod I Assistance/Device.  Outcome: Progressing   Problem: RH PAIN MANAGEMENT Goal: RH STG PAIN MANAGED AT OR BELOW PT'S PAIN GOAL Description Pain scale <4/10  10/09/2018 1153 by Esperanza Richters, RN Outcome: Progressing 10/09/2018 1058 by Esperanza Richters, RN Outcome: Progressing   Problem: RH KNOWLEDGE DEFICIT GENERAL Goal: RH STG INCREASE KNOWLEDGE OF SELF CARE AFTER HOSPITALIZATION Outcome: Progressing

## 2018-10-09 NOTE — Evaluation (Signed)
Speech Language Pathology Assessment and Plan  Patient Details  Name: Casey Harrell MRN: [id number] Date of Birth: 25-Oct-1934  EVALUATION ONLY  Today's Date: 10/09/2018 SLP Individual Time: 0730-0830 SLP Individual Time Calculation (min): 60 min   Problem List:  Patient Active Problem List   Diagnosis Date Noted  . AKI (acute kidney injury) (HCC)   . Diabetes mellitus type 2 in nonobese (HCC)   . PAF (paroxysmal atrial fibrillation) (HCC)   . Reactive hypertension   . Right pontine CVA (HCC) 10/08/2018  . COPD (chronic obstructive pulmonary disease) (HCC) 10/03/2018  . 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 Left pontine cerebrovascular accident (HCC) 04/03/2017  . History of CVA with residual deficit   . Type 2 diabetes mellitus with peripheral neuropathy (HCC)   . Anemia 03/30/2017  . History of stroke   . Chest pain 02/09/2016  . BPPV (benign paroxysmal positional vertigo) 08/25/2015  . HLD (hyperlipidemia) 08/25/2015  . At high risk for falls 07/01/2015  . Vertigo 06/21/2015  . Paroxysmal atrial fibrillation (HCC) 06/11/2015  . Cerebral infarction due to stenosis of left middle cerebral artery (HCC) 05/20/2015  . Intracranial vascular stenosis 05/20/2015  . Type 2 diabetes mellitus with circulatory disorder (HCC) 05/20/2015  . Type 2 diabetes, uncontrolled, with neuropathy (HCC)   . Abdominal pain   . Partial small bowel obstruction (HCC)   . Benign essential HTN   . Small bowel obstruction (HCC) 05/07/2015  . Hemiplegia, unspecified affecting right dominant side (HCC) 04/08/2015  . Embolic stroke involving posterior cerebral artery (HCC) 04/05/2015  . Orthostatic hypotension   . Hypertensive urgency 04/01/2015  . Fever in adult 04/01/2015  . Acute encephalopathy 04/01/2015  . Confusion   . CVA (cerebral infarction)   . Posterior circulation stroke (HCC)   . Acute ischemic stroke (HCC) 03/31/2015  . Stroke (HCC) 03/31/2015  . CKD (chronic kidney  disease) stage 3, GFR 30-59 ml/min (HCC) 04/17/2014  . Gastroenteritis, non-infectious 04/11/2014  . Nausea vomiting and diarrhea 04/10/2014  . Hypokalemia 04/10/2014  . Dehydration 04/09/2014  . Bronchitis with airway obstruction (HCC) 02/03/2013  . Leukocytosis 02/03/2013  . Fever 02/03/2013  . Acute respiratory failure with hypoxia (HCC) 02/03/2013  . Depression 02/03/2013  . Hyponatremia 02/03/2013  . Obesity (BMI 30-39.9) 01/10/2013  . PVD (peripheral vascular disease)- S/P RCE 06/18/09 12/13/2012  . Sleep apnea- C-pap intol 12/13/2012  . Low risk cardiac nuclear stress test- 2009 and Feb 2011 12/13/2012  . Mild persistent asthma 11/29/2011  . Chronic cough 06/05/2011  . Diabetic neuropathy (HCC) 09/02/2009  . Dyslipidemia 07/13/2009  . Hypothyroidism 11/12/2008  . Type 2 diabetes mellitus, uncontrolled (HCC) 07/15/2008  . Essential hypertension 07/15/2008  . GERD 07/15/2008  . DIVERTICULOSIS, COLON 07/15/2008   Past Medical History:  Past Medical History:  Diagnosis Date  . ASTHMA 07/15/2008  . COPD (chronic obstructive pulmonary disease) (HCC)   . DIABETES-TYPE 2 07/15/2008  . DIABETIC PERIPHERAL NEUROPATHY 09/02/2009  . GERD 07/15/2008  . HYPERTENSION 07/15/2008  . Normal cardiac stress test    low risk Nuc 2009, Feb 2011  . Paroxysmal atrial fibrillation (HCC)   . PVD (peripheral vascular disease) (HCC) 06/18/09   RCE  . Stroke (HCC)   . Vertigo    Past Surgical History:  Past Surgical History:  Procedure Laterality Date  . ABDOMINAL HYSTERECTOMY  1971  . APPENDECTOMY  1971  . CAROTID ENDARTERECTOMY  06/18/09   RCE  . CESAREAN SECTION    . colectomy and colostomy    .  FLEXIBLE SIGMOIDOSCOPY     diverticulitis  . FOOT SURGERY     right foot X 2  . LAPAROTOMY     X 3 with adhesions lysis  . TONSILLECTOMY    . TUBAL LIGATION      Assessment / Plan / Recommendation Clinical Impression Casey Harrell is an 84 year old right-handed female with history of PAF  maintained on Eliquis, history of CVA and received inpatient rehab services, diabetes mellitus, hypertension, COPD/asthma, peripheral neuropathy.  Per chart review patient lives with son and family.  Ambulates without assistive device.  1 level home 6 steps to entry.    Presented 10/03/2018 with right right side weakness and facial droop and mild slurred speech.  Cranial CT scan showed no acute abnormalities noted multiple chronic ischemic infarcts involving the left basal ganglia and the bilateral occipital lobes.  Patient did not receive TPA.  MRI positive for acute punctate 5 mm infarct in the right paramedial dorsal level pons.  MRA with no large vessel occlusion.  Echocardiogram with ejection fraction of 65% hyperdynamic systolic function.  Carotid Doppler with no ICA stenosis.   Tolerating a regular consistency diet.  Therapy evaluations completed and patient was admitted for a comprehensive rehab program on 10/08/18.   Bedside swallow evaluation and cognitive linguistic evaluation provided on 10/09/18. Despitemoderate right facial weakness, pt consumed regular with thin liquids with observed s/s of aspriation or oral dysphagia. Additionally, pt's flaccidity doesn't impact speech intelligibility. She if fully intelligible at the complex conversation level even in noisy environment. Addtionally, pt's cognitive function is very functional and is improved over previous admission to CIR. Pt demonstrates appropraite recall of irentation information, recall of deficits, recall of previous events in therpay sessions as well as fall. Pt does appear to have some visual deficits related to new CVA but not cognitive linguistic deficits. Pt does have appropriate help within home for money, medicine as well as ADLs. No acute needs identified. ST to sign off.    Skilled Therapeutic Interventions          Skilled treatment session focused on completion of the above mentioned evaluations. SLP provided education on POC while in  CIR and all questions answered to pt's satisfaction.    SLP Assessment  Patient does not need any further Speech Lanaguage Pathology Services    Recommendations  SLP Diet Recommendations: Age appropriate regular solids;Thin Liquid Administration via: Cup;Straw Medication Administration: Whole meds with liquid Supervision: Patient able to self feed Patient destination: Home Follow up Recommendations: None Equipment Recommended: None recommended by SLP           Pain Pain Assessment Pain Scale: 0-10 Pain Score: 0-No pain  Prior Functioning Cognitive/Linguistic Baseline: Within functional limits Type of Home: House  Lives With: Son Available Help at Discharge: Family(lives with son and daughter-in-law, both work during day M-F 7:30-1:30) Vocation: Retired  Teacher, music Term Goals: No short term goals Publishing rights manager to Care Plan for Long Term Goals  Recommendations for other services: None   Discharge Criteria: Patient will be discharged from SLP if patient refuses treatment 3 consecutive times without medical reason, if treatment goals not met, if there is a change in medical status, if patient makes no progress towards goals or if patient is discharged from hospital.  The above assessment, treatment plan, treatment alternatives and goals were discussed and mutually agreed upon: by patient  Kailyn Dubie 10/09/2018, 10:41 AM

## 2018-10-10 ENCOUNTER — Inpatient Hospital Stay (HOSPITAL_COMMUNITY): Payer: Medicare Other | Admitting: Occupational Therapy

## 2018-10-10 ENCOUNTER — Inpatient Hospital Stay (HOSPITAL_COMMUNITY): Payer: Medicare Other | Admitting: Physical Therapy

## 2018-10-10 LAB — GLUCOSE, CAPILLARY
Glucose-Capillary: 257 mg/dL — ABNORMAL HIGH (ref 70–99)
Glucose-Capillary: 340 mg/dL — ABNORMAL HIGH (ref 70–99)
Glucose-Capillary: 169 mg/dL — ABNORMAL HIGH (ref 70–99)
Glucose-Capillary: 254 mg/dL — ABNORMAL HIGH (ref 70–99)

## 2018-10-10 MED ORDER — POLYVINYL ALCOHOL 1.4 % OP SOLN
1.0000 [drp] | OPHTHALMIC | Status: DC | PRN
  Administered 2018-10-10 – 2018-10-20 (×2): 1 [drp] via OPHTHALMIC
  Filled 2018-10-10: qty 15

## 2018-10-10 MED ORDER — INSULIN DETEMIR 100 UNIT/ML ~~LOC~~ SOLN
20.0000 [IU] | Freq: Two times a day (BID) | SUBCUTANEOUS | Status: DC
  Administered 2018-10-10 – 2018-10-15 (×10): 20 [IU] via SUBCUTANEOUS
  Filled 2018-10-10 (×11): qty 0.2

## 2018-10-10 MED ORDER — AMLODIPINE BESYLATE 2.5 MG PO TABS
2.5000 mg | ORAL_TABLET | Freq: Every day | ORAL | Status: DC
  Administered 2018-10-10 – 2018-10-13 (×4): 2.5 mg via ORAL
  Filled 2018-10-10 (×4): qty 1

## 2018-10-10 NOTE — Progress Notes (Signed)
[facility] PHYSICAL MEDICINE & REHABILITATION PROGRESS NOTE  Subjective/Complaints: Patient seen laying in bed this morning.  She states she slept very well overnight.  She states she had good first day of therapies yesterday.  She notes headache, but it improves with Tylenol.  Discussed blood pressure with nursing.  ROS: Denies chest pain, shortness of breath, nausea, vomiting, diarrhea.  Objective: Vital Signs: Blood pressure (!) 169/94, pulse 79, temperature 98 F (36.7 C), temperature source Oral, resp. rate 17, height 5\' 1"  (1.549 m), weight 60.4 kg, SpO2 93 %. Ct Head Wo Contrast  Result Date: 10/08/2018 CLINICAL DATA:  Head injury after fall. EXAM: CT HEAD WITHOUT CONTRAST TECHNIQUE: Contiguous axial images were obtained from the base of the skull through the vertex without intravenous contrast. COMPARISON:  CT scan of Oct 03, 2018. FINDINGS: Brain: Mild diffuse cortical atrophy is noted. Mild chronic ischemic white matter disease is noted. No mass effect or midline shift is noted. Ventricular size is within normal limits. There is no evidence of mass lesion, hemorrhage or acute infarction. Vascular: No hyperdense vessel or unexpected calcification. Skull: Normal. Negative for fracture or focal lesion. Sinuses/Orbits: No acute finding. Other: None. IMPRESSION: Mild diffuse cortical atrophy. Mild chronic ischemic white matter disease. No acute intracranial abnormality seen. Electronically Signed   By: Lupita Raider M.D.   On: 10/08/2018 21:48   Recent Labs    10/08/18 0337 10/09/18 0624  WBC 8.8 9.6  HGB 12.3 12.8  HCT 36.8 38.2  PLT 255 261   Recent Labs    10/08/18 0337 10/09/18 0624  NA 137 138  K 3.6 4.3  CL 101 100  CO2 26 28  GLUCOSE 262* 230*  BUN 20 18  CREATININE 1.02* 1.08*  CALCIUM 9.2 9.7    Physical Exam: BP (!) 169/94 (BP Location: Left Arm)   Pulse 79   Temp 98 F (36.7 C) (Oral)   Resp 17   Ht 5\' 1"  (1.549 m)   Wt 60.4 kg   SpO2 93%   BMI 25.16  kg/m  Constitutional: No distress . Vital signs reviewed. HENT: Normocephalic.  Atraumatic. Eyes: No discharge.  Does not move eyes past midline to the right visual fields Cardiovascular: No JVD. Respiratory: Normal effort. GI: Non-distended. Musc: No edema or tenderness in extremities. Neurological: Alert and oriented  Makes good eye contact with examiner and follows commands.  Fair awareness of deficits Right facial weakness, persistent Motor: RUE/RLE 5/5 prox to distal.  LUE/LLE 5/5, stable.   Left pupil dilated Skin: Skin iswarmand dry.  Psychiatric: She has anormal mood and affect. Herbehavior is normal.Judgmentand thought contentnormal.   Assessment/Plan: 1. Functional deficits secondary to right paramedial dorsal pons punctate infarct which require 3+ hours per day of interdisciplinary therapy in a comprehensive inpatient rehab setting.  Physiatrist is providing close team supervision and 24 hour management of active medical problems listed below.  Physiatrist and rehab team continue to assess barriers to discharge/monitor patient progress toward functional and medical goals  Care Tool:  Bathing    Body parts bathed by patient: Right arm, Left arm, Chest, Abdomen, Front perineal area, Buttocks, Right upper leg, Left upper leg, Right lower leg, Left lower leg, Face   Body parts bathed by helper: Right lower leg, Left lower leg, Buttocks     Bathing assist Assist Level: Minimal Assistance - Patient > 75%     Upper Body Dressing/Undressing Upper body dressing Upper body dressing/undressing activity did not occur (including orthotics): N/A What is  the patient wearing?: Pull over shirt    Upper body assist Assist Level: Supervision/Verbal cueing    Lower Body Dressing/Undressing Lower body dressing    Lower body dressing activity did not occur: N/A What is the patient wearing?: Underwear/pull up, Pants     Lower body assist Assist for lower body dressing:  Minimal Assistance - Patient > 75%     Toileting Toileting    Toileting assist Assist for toileting: Contact Guard/Touching assist     Transfers Chair/bed transfer  Transfers assist     Chair/bed transfer assist level: Minimal Assistance - Patient > 75%     Locomotion Ambulation   Ambulation assist      Assist level: Contact Guard/Touching assist Assistive device: Walker-rolling Max distance: 50'   Walk 10 feet activity   Assist     Assist level: Minimal Assistance - Patient > 75% Assistive device: No Device   Walk 50 feet activity   Assist    Assist level: Minimal Assistance - Patient > 75% Assistive device: No Device    Walk 150 feet activity   Assist Walk 150 feet activity did not occur: Safety/medical concerns         Walk 10 feet on uneven surface  activity   Assist Walk 10 feet on uneven surfaces activity did not occur: Safety/medical concerns         Wheelchair     Assist Will patient use wheelchair at discharge?: No   Wheelchair activity did not occur: N/A         Wheelchair 50 feet with 2 turns activity    Assist    Wheelchair 50 feet with 2 turns activity did not occur: N/A       Wheelchair 150 feet activity     Assist Wheelchair 150 feet activity did not occur: N/A          Medical Problem List and Plan: 1.Right side weakness with facial droopsecondary to acute punctate infarct right paramedial dorsal level of pons as well 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 Antithrombotics: -DVT/anticoagulation:Eliquis -antiplatelet therapy: N/A 3. Pain Management:Lyrica 150 mg daily, Cymbalta 30 mg twice daily 4. Mood:Provide emotional support -antipsychotic agents: N/A 5. Neuropsych: This patientiscapable of making decisions on hisown behalf. 6. Skin/Wound Care:Routine skin checks 7.  Fluids/Electrolytes/Nutrition:Routine in and outs  8.PAF. Monitor with increased activity during therapy  Rate controlled on 6/4  Monitor with increased mobility 9.Diabetes mellitus. Hemoglobin A1c 9.6.   NovoLog 3 units 3 times daily  Levemir increased to 20 twice daily on 6/4.   Check blood sugars before meals and at bedtime.   Diabetic teaching by team  Monitor with increased mobility 10. COPD/asthma. Continue nebulizers as directed 11. Hypothyroidism. Synthroid 12. GERD. Protonix 13.  AKI versus CKD  Creatinine 1.08 on 6/3  Continue to monitor 14.  Hypertension: ?  Reactive  Norvasc 2.5 started on 6/4  Monitor with increased mobility   LOS: 2 days A FACE TO FACE EVALUATION WAS PERFORMED  Jazzmon Prindle Karis Jubanil Kennon Encinas 10/10/2018, 9:39 AM

## 2018-10-10 NOTE — Plan of Care (Signed)
  Problem: Consults Goal: RH STROKE PATIENT EDUCATION Description See Patient Education module for education specifics  Outcome: Progressing   Problem: RH BOWEL ELIMINATION Goal: RH STG MANAGE BOWEL WITH ASSISTANCE Description STG Manage Bowel with mod I Assistance.  Outcome: Progressing   Problem: RH BLADDER ELIMINATION Goal: RH STG MANAGE BLADDER WITH ASSISTANCE Description STG Manage Bladder With mod I Assistance  Outcome: Progressing   Problem: RH SKIN INTEGRITY Goal: RH STG MAINTAIN SKIN INTEGRITY WITH ASSISTANCE Description STG Maintain Skin Integrity With mod I  Assistance.  Outcome: Progressing   Problem: RH SAFETY Goal: RH STG ADHERE TO SAFETY PRECAUTIONS W/ASSISTANCE/DEVICE Description STG Adhere to Safety Precautions With mod I Assistance/Device.  Outcome: Progressing Goal: RH STG DECREASED RISK OF FALL WITH ASSISTANCE Description STG Decreased Risk of Fall With mod I Assistance.  Outcome: Progressing   Problem: RH PAIN MANAGEMENT Goal: RH STG PAIN MANAGED AT OR BELOW PT'S PAIN GOAL Description Pain scale <4/10  Outcome: Progressing   Problem: RH BLADDER ELIMINATION Goal: RH STG MANAGE BLADDER WITH ASSISTANCE Description STG Manage Bladder With mod I Assistance  Outcome: Progressing   Problem: RH SAFETY Goal: RH STG ADHERE TO SAFETY PRECAUTIONS W/ASSISTANCE/DEVICE Description STG Adhere to Safety Precautions With mod I Assistance/Device.  Outcome: Progressing   Problem: RH PAIN MANAGEMENT Goal: RH STG PAIN MANAGED AT OR BELOW PT'S PAIN GOAL Description Pain scale <4/10  Outcome: Progressing   Problem: RH Vision Goal: RH LTG Vision (Specify) Outcome: Progressing

## 2018-10-10 NOTE — Progress Notes (Signed)
Occupational Therapy Session Note  Patient Details  Name: Casey Harrell MRN: [id number] Date of Birth: April 05, 1935  Today's Date: 10/10/2018 Session 1 OT Individual Time: 8882-8003 OT Individual Time Calculation (min): 39 min   Session 2 OT Individual Time: 1045-1200 OT Individual Time Calculation (min): 75 min    Short Term Goals: Week 1:  OT Short Term Goal 1 (Week 1): patient will perform all adl tasks at a S level OT Short Term Goal 2 (Week 1): patient will perform functional transfers with CS using approp DME/AD OT Short Term Goal 3 (Week 1): patient will carryover visual motor exercises with min cues  Skilled Therapeutic Interventions/Progress Updates:   Session 1   Pt greeted semi-reclined in bed and agreeable to OT treatment session. Pt reported need to go to the bathroom. Pt completed bed mobility with supervision. Sit<>stand w/ RW and CGA.Pt ambulated to the bathroom w/ RW and CGA. Pt voided bladder and was able to completed 3/3 toileting tasks with supervision. Pt completed grooming tasks standing at the sink with education provided for RW positioning at the sink. Pt took seated rest break, then ambulated to the gym with RW and CGA. UB there-ex on [employer] arm bike 15 mins on level 1 with RPMs above 23. Pt ambulated back to room and left semi-reclined in bed with needs met.     Session 2 Pt greeted semi-reclined in bed and agreeable to OT treatment session focused on self-care retraining. Pt completed bed mobility, and ambulated throughout session with RW and CGA. Pt voided bladder on commode and completed peri-care supervision. Verbal cues to sit down to doff pants and shirt for safety. Bathing completed with overall CGA when standing to wash buttocks. Dressing completed seated EOB with focus on R hand coordination and hemi dressing techniques. Pt ambulated to therapy gym w/ RW and CGA overall with intermittent Min A. Verbal cues to slow down and ensure R foot clearance with stepping.  Worked on R fine motor control with graded peg board task focused on in-hand manipulation, palmer translation, and pincer grasp on pegs.  Continued working on R hand strength/coordination with theraputty exercises. Pt ambulated back to room as described above. Pt left seated in wc with alarm pad on and needs met.   Therapy Documentation Precautions:  Precautions Precautions: Fall Restrictions Weight Bearing Restrictions: No Pain: Pain Assessment Pain Scale: 0-10 Pain Score: 3  Pain Type: Acute pain Pain Location: Head Pain Orientation: Posterior Pain Descriptors / Indicators: Aching Pain Frequency: Intermittent Pain Onset: Progressive Pain Intervention(s): Repositioned  Therapy/Group: Individual Therapy  Valma Cava 10/10/2018, 1:24 PM

## 2018-10-10 NOTE — Progress Notes (Signed)
Social Work  Social Work Assessment and Plan  Patient Details  Name: Casey Harrell MRN: [id number] Date of Birth: 04-Mar-1935  Today's Date: 10/10/2018  Problem List:  Patient Active Problem List   Diagnosis Date Noted  . AKI (acute kidney injury) (HCC)   . Diabetes mellitus type 2 in nonobese (HCC)   . PAF (paroxysmal atrial fibrillation) (HCC)   . Reactive hypertension   . Right pontine CVA (HCC) 10/08/2018  . COPD (chronic obstructive pulmonary disease) (HCC) 10/03/2018  . 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 Left pontine cerebrovascular accident (HCC) 04/03/2017  . History of CVA with residual deficit   . Type 2 diabetes mellitus with peripheral neuropathy (HCC)   . Anemia 03/30/2017  . History of stroke   . Chest pain 02/09/2016  . BPPV (benign paroxysmal positional vertigo) 08/25/2015  . HLD (hyperlipidemia) 08/25/2015  . At high risk for falls 07/01/2015  . Vertigo 06/21/2015  . Paroxysmal atrial fibrillation (HCC) 06/11/2015  . Cerebral infarction due to stenosis of left middle cerebral artery (HCC) 05/20/2015  . Intracranial vascular stenosis 05/20/2015  . Type 2 diabetes mellitus with circulatory disorder (HCC) 05/20/2015  . Type 2 diabetes, uncontrolled, with neuropathy (HCC)   . Abdominal pain   . Partial small bowel obstruction (HCC)   . Benign essential HTN   . Small bowel obstruction (HCC) 05/07/2015  . Hemiplegia, unspecified affecting right dominant side (HCC) 04/08/2015  . Embolic stroke involving posterior cerebral artery (HCC) 04/05/2015  . Orthostatic hypotension   . Hypertensive urgency 04/01/2015  . Fever in adult 04/01/2015  . Acute encephalopathy 04/01/2015  . Confusion   . CVA (cerebral infarction)   . Posterior circulation stroke (HCC)   . Acute ischemic stroke (HCC) 03/31/2015  . Stroke (HCC) 03/31/2015  . CKD (chronic kidney disease) stage 3, GFR 30-59 ml/min (HCC) 04/17/2014  . Gastroenteritis, non-infectious 04/11/2014  .  Nausea vomiting and diarrhea 04/10/2014  . Hypokalemia 04/10/2014  . Dehydration 04/09/2014  . Bronchitis with airway obstruction (HCC) 02/03/2013  . Leukocytosis 02/03/2013  . Fever 02/03/2013  . Acute respiratory failure with hypoxia (HCC) 02/03/2013  . Depression 02/03/2013  . Hyponatremia 02/03/2013  . Obesity (BMI 30-39.9) 01/10/2013  . PVD (peripheral vascular disease)- S/P RCE 06/18/09 12/13/2012  . Sleep apnea- C-pap intol 12/13/2012  . Low risk cardiac nuclear stress test- 2009 and Feb 2011 12/13/2012  . Mild persistent asthma 11/29/2011  . Chronic cough 06/05/2011  . Diabetic neuropathy (HCC) 09/02/2009  . Dyslipidemia 07/13/2009  . Hypothyroidism 11/12/2008  . Type 2 diabetes mellitus, uncontrolled (HCC) 07/15/2008  . Essential hypertension 07/15/2008  . GERD 07/15/2008  . DIVERTICULOSIS, COLON 07/15/2008   Past Medical History:  Past Medical History:  Diagnosis Date  . ASTHMA 07/15/2008  . COPD (chronic obstructive pulmonary disease) (HCC)   . DIABETES-TYPE 2 07/15/2008  . DIABETIC PERIPHERAL NEUROPATHY 09/02/2009  . GERD 07/15/2008  . HYPERTENSION 07/15/2008  . Normal cardiac stress test    low risk Nuc 2009, Feb 2011  . Paroxysmal atrial fibrillation (HCC)   . PVD (peripheral vascular disease) (HCC) 06/18/09   RCE  . Stroke (HCC)   . Vertigo    Past Surgical History:  Past Surgical History:  Procedure Laterality Date  . ABDOMINAL HYSTERECTOMY  1971  . APPENDECTOMY  1971  . CAROTID ENDARTERECTOMY  06/18/09   RCE  . CESAREAN SECTION    . colectomy and colostomy    . FLEXIBLE SIGMOIDOSCOPY     diverticulitis  .  FOOT SURGERY     right foot X 2  . LAPAROTOMY     X 3 with adhesions lysis  . TONSILLECTOMY    . TUBAL LIGATION     Social History:  reports that she has never smoked. She has never used smokeless tobacco. She reports that she does not drink alcohol or use drugs.  Family / Support Systems Marital Status: Widow/Widower Patient Roles:  Parent Children: son, Deleshia Brause (and wife, Dedra Skeens) @ [phone] - pt lives with them;  daughter, Vic Ripper [facility]) @ [phone] Other Supports: granddaughter, Ileigh Dowse @ [phone] Anticipated Caregiver: son and his family Ability/Limitations of Caregiver: son and family work days Caregiver Availability: Intermittent Family Dynamics: Pt describes close relationship with son and family.    Social History Preferred language: English Religion: Baptist Cultural Background: NA Read: Yes Write: Yes Employment Status: Retired Marine scientist Issues: None Guardian/Conservator: None - per MD, pt is capable of making decisions on her own behalf.   Abuse/Neglect Abuse/Neglect Assessment Can Be Completed: Yes Physical Abuse: Denies Verbal Abuse: Denies Sexual Abuse: Denies Exploitation of patient/patient's resources: Denies Self-Neglect: Denies  Emotional Status Pt's affect, behavior and adjustment status: Pt very pleasant, talkative and notes that she is hopeful to make a good recovery.  She feels this recent stroke was not as bad as the last.  Pt denies any significant emotional distress - will monitor. Recent Psychosocial Issues: Husband died 03/29/18 and she moved in with son and his family. Psychiatric History: None Substance Abuse History: None  Patient / Family Perceptions, Expectations & Goals Pt/Family understanding of illness & functional limitations: Pt with good understanding of her stroke and her resulting functional deficits/ need for CIR again. Premorbid pt/family roles/activities: Pt was independent overall.  Alone in the morning when family at work. Anticipated changes in roles/activities/participation: Little change anticipated if pt able to reach anticipated mod ind goals. Pt/family expectations/goals: "I want to be able to do as much as I did before."  [employer]: None Premorbid Home Care/DME Agencies: Other  (Comment)([facility] in past) Transportation available at discharge: yes Resource referrals recommended: Neuropsychology  Discharge Planning Living Arrangements: Children Support Systems: Children, Other relatives, Friends/neighbors Type of Residence: Private residence Insurance Resources: Medicare([employer] Medicare) Surveyor, quantity Resources: Restaurant manager, fast food Screen Referred: No Living Expenses: Lives with family Money Management: Patient, Family Does the patient have any problems obtaining your medications?: No Home Management: Pt and family share responsibilities Patient/Family Preliminary Plans: Pt to return home with son and his family. Social Work Anticipated Follow Up Needs: HH/OP Expected length of stay: ELOS 10 to 14 days  Clinical Impression Pleasant woman who returns to CIR following another stroke (prior stays in 03/30/2015 and 03/29/17).  She is very motivated and hopeful to make a good recovery as she did before.  Is now living with her son and his wife since her spouse died in 2018-03-29 and states this has been a good support arrangement.  Denies any significant emotional distress.  Goals set for mod independent overall.  Will follow for support and d/c planning needs.  Dejanique Ruehl 10/10/2018, 4:05 PM

## 2018-10-10 NOTE — Progress Notes (Signed)
This nurse was notified by the nurse tech of the pts 2200 CBG: 257; per provider order the HS correction insulin is not to be given. Will continue to monitor.

## 2018-10-10 NOTE — Progress Notes (Signed)
Physical Therapy Session Note  Patient Details  Name: Casey Harrell MRN: [id number] Date of Birth: Jun 26, 1934  Today's Date: 10/10/2018 PT Individual Time: 1330-1440 PT Individual Time Calculation (min): 70 min   Short Term Goals: Week 1:  PT Short Term Goal 1 (Week 1): Pt will ambulate 30' w/ supervision in household environment  PT Short Term Goal 2 (Week 1): Pt will performed bed<>chair transfer w/ supervision PT Short Term Goal 3 (Week 1): Pt will maintain dynamic standing balance w/ supervision   Skilled Therapeutic Interventions/Progress Updates:   Pt received toileting w/ NT, agreeable to therapy and no c/o pain. Stood at sink to wash hands w/ CGA and ambulated to/from therapy gym w/ RW, CGA-min assist for balance and verbal cues for RW management. Ambulated w/o AD x100' w/ min-mod assist for balance. Worked on balance strategies in stance w/ LEs hip distance apart progressing to feet together. Min assist for balance while performing RUE reaching tasks w/ clothespins. Emphasis on facilitating R lateral weightshifting and ankle/hip balance strategies. 5x sit<>stands x2 reps w/ min assist and verbal/visual cues for ankle and hip balance strategies, and for anterior weight shifting to over toes as pt w/ posterior lean bias in stance. Dynavision in seated and in stance using RUE to work on visual scanning to R side. See average reaction times below, increased discrepancy between R and L visual fields once in stance. Pt required head turns to R side for all lights on R side. Reviewed R visual field scanning exercises and R eye wink exercises. Returned to room and ended session in supine, all needs in reach.   Seated  L - 2.17 R - 2.37  Standing  L - 1.57  R - 2.73  Therapy Documentation Precautions:  Precautions Precautions: Fall Restrictions Weight Bearing Restrictions: No  Therapy/Group: Individual Therapy  Jaydalee Bardwell K Loralei Radcliffe 10/10/2018, 2:52 PM

## 2018-10-11 ENCOUNTER — Inpatient Hospital Stay (HOSPITAL_COMMUNITY): Payer: Medicare Other | Admitting: Physical Therapy

## 2018-10-11 ENCOUNTER — Inpatient Hospital Stay (HOSPITAL_COMMUNITY): Payer: Medicare Other | Admitting: Occupational Therapy

## 2018-10-11 LAB — GLUCOSE, CAPILLARY
Glucose-Capillary: 252 mg/dL — ABNORMAL HIGH (ref 70–99)
Glucose-Capillary: 209 mg/dL — ABNORMAL HIGH (ref 70–99)
Glucose-Capillary: 357 mg/dL — ABNORMAL HIGH (ref 70–99)
Glucose-Capillary: 282 mg/dL — ABNORMAL HIGH (ref 70–99)

## 2018-10-11 NOTE — Progress Notes (Signed)
[facility] PHYSICAL MEDICINE & REHABILITATION PROGRESS NOTE  Subjective/Complaints: Patient seen sitting up at the edge of her bed this morning.  Noted to have good sitting balance.  She states she slept well overnight.  ROS: Denies chest pain, shortness of breath, nausea, vomiting, diarrhea.  Objective: Vital Signs: Blood pressure (!) 160/74, pulse 71, temperature 97.6 F (36.4 C), resp. rate 16, height 5\' 1"  (1.549 m), weight 60.4 kg, SpO2 95 %. No results found. Recent Labs    10/09/18 0624  WBC 9.6  HGB 12.8  HCT 38.2  PLT 261   Recent Labs    10/09/18 0624  NA 138  K 4.3  CL 100  CO2 28  GLUCOSE 230*  BUN 18  CREATININE 1.08*  CALCIUM 9.7    Physical Exam: BP (!) 160/74   Pulse 71   Temp 97.6 F (36.4 C)   Resp 16   Ht 5\' 1"  (1.549 m)   Wt 60.4 kg   SpO2 95%   BMI 25.16 kg/m  Constitutional: No distress . Vital signs reviewed. HENT: Normocephalic.  Atraumatic. Eyes: No discharge.  Cannot move eyes past midline to the right visual fields, persistent Cardiovascular: No JVD. Respiratory: Normal effort. GI: Non-distended. Musc: No edema or tenderness in extremities. Neurological: Alert and oriented  Makes good eye contact with examiner and follows commands.  Fair awareness of deficits Right facial weakness, unchanged Motor: RUE/RLE 5/5 prox to distal.  LUE/LLE 5/5, stable.   Left pupil dilated Skin: Skin iswarmand dry.  Psychiatric: She has anormal mood and affect. Herbehavior is normal.Judgmentand thought contentnormal.   Assessment/Plan: 1. Functional deficits secondary to right paramedial dorsal pons punctate infarct which require 3+ hours per day of interdisciplinary therapy in a comprehensive inpatient rehab setting.  Physiatrist is providing close team supervision and 24 hour management of active medical problems listed below.  Physiatrist and rehab team continue to assess barriers to discharge/monitor patient progress toward  functional and medical goals  Care Tool:  Bathing    Body parts bathed by patient: Left arm, Right arm, Chest, Abdomen, Front perineal area, Buttocks, Right upper leg, Left upper leg, Right lower leg, Left lower leg, Face   Body parts bathed by helper: Right lower leg, Left lower leg, Buttocks     Bathing assist Assist Level: Contact Guard/Touching assist     Upper Body Dressing/Undressing Upper body dressing Upper body dressing/undressing activity did not occur (including orthotics): N/A What is the patient wearing?: Pull over shirt    Upper body assist Assist Level: Supervision/Verbal cueing    Lower Body Dressing/Undressing Lower body dressing    Lower body dressing activity did not occur: N/A What is the patient wearing?: Underwear/pull up, Pants     Lower body assist Assist for lower body dressing: Contact Guard/Touching assist     Toileting Toileting    Toileting assist Assist for toileting: Contact Guard/Touching assist     Transfers Chair/bed transfer  Transfers assist     Chair/bed transfer assist level: Contact Guard/Touching assist     Locomotion Ambulation   Ambulation assist      Assist level: Contact Guard/Touching assist Assistive device: Walker-rolling Max distance: 150'   Walk 10 feet activity   Assist     Assist level: Contact Guard/Touching assist Assistive device: Walker-rolling   Walk 50 feet activity   Assist    Assist level: Contact Guard/Touching assist Assistive device: Walker-rolling    Walk 150 feet activity   Assist Walk 150 feet activity did not  occur: Safety/medical concerns  Assist level: Contact Guard/Touching assist Assistive device: Walker-rolling    Walk 10 feet on uneven surface  activity   Assist Walk 10 feet on uneven surfaces activity did not occur: Safety/medical concerns         Wheelchair     Assist Will patient use wheelchair at discharge?: No   Wheelchair activity did not  occur: N/A         Wheelchair 50 feet with 2 turns activity    Assist    Wheelchair 50 feet with 2 turns activity did not occur: N/A       Wheelchair 150 feet activity     Assist Wheelchair 150 feet activity did not occur: N/A          Medical Problem List and Plan: 1.Right side weakness with facial droopsecondary to acute punctate infarct right paramedial dorsal level of pons as well 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 Antithrombotics: -DVT/anticoagulation:Eliquis -antiplatelet therapy: N/A 3. Pain Management:Lyrica 150 mg daily, Cymbalta 30 mg twice daily 4. Mood:Provide emotional support -antipsychotic agents: N/A 5. Neuropsych: This patientiscapable of making decisions on hisown behalf. 6. Skin/Wound Care:Routine skin checks 7. Fluids/Electrolytes/Nutrition:Routine in and outs  8.PAF. Monitor with increased activity during therapy  Rate controlled on 6/4  Monitor with increased mobility 9.Diabetes mellitus. Hemoglobin A1c 9.6.   NovoLog 3 units 3 times daily  Levemir increased to 20 twice daily on 6/4.   Remains elevated on 6/5, will likely require further adjustment of medications  Check blood sugars before meals and at bedtime.   Diabetic teaching by team  Monitor with increased mobility 10. COPD/asthma. Continue nebulizers as directed 11. Hypothyroidism. Synthroid 12. GERD. Protonix 13.  AKI versus CKD  Creatinine 1.08 on 6/3  Continue to monitor 14.  Hypertension: ?  Reactive  Norvasc 2.5 started on 6/4  Labile elevated on 6/5, will consider further increase in medications  Monitor with increased mobility   LOS: 3 days A FACE TO FACE EVALUATION WAS PERFORMED  Rakeem Colley Karis JubaAnil Brenyn Petrey 10/11/2018, 8:56 AM

## 2018-10-11 NOTE — Progress Notes (Signed)
Occupational Therapy Session Note  Patient Details  Name: ABRYANNA VANDEWIELE MRN: [id number] Date of Birth: 05/09/1934  Today's Date: 10/11/2018 OT Individual Time: 1062-6948 OT Individual Time Calculation (min): 55 min    Short Term Goals: Week 1:  OT Short Term Goal 1 (Week 1): patient will perform all adl tasks at a S level OT Short Term Goal 2 (Week 1): patient will perform functional transfers with CS using approp DME/AD OT Short Term Goal 3 (Week 1): patient will carryover visual motor exercises with min cues  Skilled Therapeutic Interventions/Progress Updates:    Pt completed shower, grooming, and dressing tasks during session.  Min contact guard for ambulation to the shower with use of the RW.  She was able to complete all bathing sit to stand with supervision and use of the grab bars for support.  She then transferred out to the sink where she worked on brushing her teeth and applying lotion and deodorant sit to stand with min guard assist.  She was able to gather he clothes with use of the RW for dressing but needed min instructional cueing to stay inside of the walker and to not try and step to the outside of it.  She completed all dressing with supervision including donning bra and shoes.   Finished ADL with work on ambulation around the room to gather up dirty clothing with min hand held assist.  Concluded OT with pt sitting on the bed with work on oculomotor exercises and gaze stabilization exercises for 1 set each.  Pt demonstrates moderate difficulty completing either at this time, with decreased ability to scan laterally with the right eye.  Pt left in bed with call button and phone in reach with bed alarm in place.    Therapy Documentation Precautions:  Precautions Precautions: Fall Restrictions Weight Bearing Restrictions: No  Pain: Pain Assessment Pain Scale: Faces Pain Score: 0-No pain ADL: See Care Tool Section for some details of ADL  Therapy/Group: Individual  Therapy  Foday Cone OTR/L 10/11/2018, 12:06 PM

## 2018-10-11 NOTE — Progress Notes (Signed)
Social Work Patient ID: Casey Harrell, female   DOB: 1934-07-21, 84 y.o.   MRN: [id number]  Have reviewed team conference with pt and son, Casey Harrell.  Both aware and agreeable with targeted d/c date of 6/15 and modified independent goals overall.  No concerns or questions at this time.  Kima Malenfant, LCSW

## 2018-10-11 NOTE — Care Management (Signed)
[facility] Individual Statement of Services  Patient Name:  Casey Harrell  Date:  10/11/2018  Welcome to the [facility].  Our goal is to provide you with an individualized program based on your diagnosis and situation, designed to meet your specific needs.  With this comprehensive rehabilitation program, you will be expected to participate in at least 3 hours of rehabilitation therapies Monday-Friday, with modified therapy programming on the weekends.  Your rehabilitation program will include the following services:  Physical Therapy (PT), Occupational Therapy (OT), Speech Therapy (ST), 24 hour per day rehabilitation nursing, Therapeutic Recreaction (TR), Neuropsychology, Case Management (Social Worker), Rehabilitation Medicine, Nutrition Services and Pharmacy Services  Weekly team conferences will be held on Wednesdays to discuss your progress.  Your Social Worker will talk with you frequently to get your input and to update you on team discussions.  Team conferences with you and your family in attendance may also be held.  Expected length of stay: 10-14 days   Overall anticipated outcome: modified independent  Depending on your progress and recovery, your program may change. Your Social Worker will coordinate services and will keep you informed of any changes. Your Social Worker's name and contact numbers are listed  below.  The following services may also be recommended but are not provided by the [facility]:   Driving Evaluations  Home Health Rehabiltiation Services  Outpatient Rehabilitation Services   Arrangements will be made to provide these services after discharge if needed.  Arrangements include referral to agencies that provide these services.  Your insurance has been verified to be:  [facility] Medicare Your primary doctor is:  Systems analyst  Pertinent information will be shared with your doctor and your insurance  company.  Social Worker:  [place], [place] [phone] or ([phone]   Information discussed with and copy given to patient by: Amada Jupiter, 10/11/2018, 1:13 PM

## 2018-10-11 NOTE — Patient Care Conference (Signed)
Inpatient RehabilitationTeam Conference and Plan of Care Update Date: 10/09/2018   Time: 2:40 PM    Patient Name: Casey Harrell      Medical Record Number: [id number]  Date of Birth: 11-21-1934 Sex: Female         Room/Bed: 4M11C/4M11C-01 Payor Info: Payor: Advertising copywriter MEDICARE / Plan: [employer] MEDICARE / Product Type: *No Product type* /    Admitting Diagnosis: ABI Team Rt CVA; 13-15days  Admit Date/Time:  10/08/2018  4:20 PM Admission Comments: No comment available   Primary Diagnosis:  <principal problem not specified> Principal Problem: <principal problem not specified>  Patient Active Problem List   Diagnosis Date Noted  . AKI (acute kidney injury) (HCC)   . Diabetes mellitus type 2 in nonobese (HCC)   . PAF (paroxysmal atrial fibrillation) (HCC)   . Reactive hypertension   . Right pontine CVA (HCC) 10/08/2018  . COPD (chronic obstructive pulmonary disease) (HCC) 10/03/2018  . 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 Left pontine cerebrovascular accident (HCC) 04/03/2017  . History of CVA with residual deficit   . Type 2 diabetes mellitus with peripheral neuropathy (HCC)   . Anemia 03/30/2017  . History of stroke   . Chest pain 02/09/2016  . BPPV (benign paroxysmal positional vertigo) 08/25/2015  . HLD (hyperlipidemia) 08/25/2015  . At high risk for falls 07/01/2015  . Vertigo 06/21/2015  . Paroxysmal atrial fibrillation (HCC) 06/11/2015  . Cerebral infarction due to stenosis of left middle cerebral artery (HCC) 05/20/2015  . Intracranial vascular stenosis 05/20/2015  . Type 2 diabetes mellitus with circulatory disorder (HCC) 05/20/2015  . Type 2 diabetes, uncontrolled, with neuropathy (HCC)   . Abdominal pain   . Partial small bowel obstruction (HCC)   . Benign essential HTN   . Small bowel obstruction (HCC) 05/07/2015  . Hemiplegia, unspecified affecting right dominant side (HCC) 04/08/2015  . Embolic stroke involving posterior cerebral artery (HCC)  04/05/2015  . Orthostatic hypotension   . Hypertensive urgency 04/01/2015  . Fever in adult 04/01/2015  . Acute encephalopathy 04/01/2015  . Confusion   . CVA (cerebral infarction)   . Posterior circulation stroke (HCC)   . Acute ischemic stroke (HCC) 03/31/2015  . Stroke (HCC) 03/31/2015  . CKD (chronic kidney disease) stage 3, GFR 30-59 ml/min (HCC) 04/17/2014  . Gastroenteritis, non-infectious 04/11/2014  . Nausea vomiting and diarrhea 04/10/2014  . Hypokalemia 04/10/2014  . Dehydration 04/09/2014  . Bronchitis with airway obstruction (HCC) 02/03/2013  . Leukocytosis 02/03/2013  . Fever 02/03/2013  . Acute respiratory failure with hypoxia (HCC) 02/03/2013  . Depression 02/03/2013  . Hyponatremia 02/03/2013  . Obesity (BMI 30-39.9) 01/10/2013  . PVD (peripheral vascular disease)- S/P RCE 06/18/09 12/13/2012  . Sleep apnea- C-pap intol 12/13/2012  . Low risk cardiac nuclear stress test- 2009 and Feb 2011 12/13/2012  . Mild persistent asthma 11/29/2011  . Chronic cough 06/05/2011  . Diabetic neuropathy (HCC) 09/02/2009  . Dyslipidemia 07/13/2009  . Hypothyroidism 11/12/2008  . Type 2 diabetes mellitus, uncontrolled (HCC) 07/15/2008  . Essential hypertension 07/15/2008  . GERD 07/15/2008  . DIVERTICULOSIS, COLON 07/15/2008    Expected Discharge Date: Expected Discharge Date: 10/21/18  Team Members Present: Physician leading conference: Dr. Maryla Morrow Social Worker Present: Amada Jupiter, LCSW Nurse Present: Other (comment)(Danny Gala Romney, RN) PT Present: Carlynn Purl, PT OT Present: Blanch Media, OT SLP Present: Feliberto Gottron, SLP PPS Coordinator present : Edson Snowball, PT     Current Status/Progress Goal Weekly Team Focus  Medical  Right side weakness with facial droop secondary to acute punctate infarct right paramedial dorsal level of pons as well old cortical infarcts in the occipital lobes more extensive on the left than on the right  Improve mobility, HTN,  AKI, DM  See above   Bowel/Bladder   Continent of bowel and bladder LBM 10/09/18  remain continent of bowel and bladder  offer toileting q3-4 hours while awake   Swallow/Nutrition/ Hydration             ADL's   CGA-min A overall  mod i  mobility, endurance, strength, vision ADl retraining   Mobility   CGA-min assist overall, gait 50-150' w/ RW  mod/i household gait   balance, gait w/ LRAD, endurance, R visual/perceptual remediation during functional tasks    Communication             Safety/Cognition/ Behavioral Observations            Pain   patient complains of frequent headaches located on right posterior head and radiating down her shoulder  pain less than or equal to 4/10   alternative treatment in addition to medication    Skin   Bruising to bilateral upper extremities  no new skin injury/breakdown  q shift and prn skin assessment    Rehab Goals Patient on target to meet rehab goals: Yes *See Care Plan and progress notes for long and short-term goals.     Barriers to Discharge  Current Status/Progress Possible Resolutions Date Resolved   Physician    Medical stability     See above  Therapies, optimize HTN/DM meds, follow labs      Nursing                  PT  Decreased caregiver support  son and daughter-in-law gone during weekdays 7:30-1:30              OT                  SLP                SW                Discharge Planning/Teaching Needs:  Plan for pt to d/c home with son and his family who can provide assist intermittently.  Teaching needs to be determined closer to d/c.   Team Discussion:  Monitor labs closely;  Cont b/b.  CG to min assist overall with mod ind goals.  Visual / motor deficits present.  Cannot fully close right eye.  Very pleasant.  Revisions to Treatment Plan:  NA    Continued Need for Acute Rehabilitation Level of Care: The patient requires daily medical management by a physician with specialized training in physical medicine and  rehabilitation for the following conditions: Daily direction of a multidisciplinary physical rehabilitation program to ensure safe treatment while eliciting the highest outcome that is of practical value to the patient.: Yes Daily medical management of patient stability for increased activity during participation in an intensive rehabilitation regime.: Yes Daily analysis of laboratory values and/or radiology reports with any subsequent need for medication adjustment of medical intervention for : Neurological problems;Diabetes problems;Blood pressure problems;Renal problems   I attest that I was present, lead the team conference, and concur with the assessment and plan of the team.   Amada Jupiter 10/11/2018, 12:52 PM    Team conference was held via web/ teleconference due to COVID - 19

## 2018-10-11 NOTE — Progress Notes (Signed)
Physical Therapy Session Note  Patient Details  Name: Casey Harrell MRN: [id number] Date of Birth: [birth year]  Today's Date: 10/11/2018 PT Individual Time: 0800-0855 AND 1330-1430 PT Individual Time Calculation (min): 55 min AND 60 min   Short Term Goals: Week 1:  PT Short Term Goal 1 (Week 1): Pt will ambulate 57' w/ supervision in household environment  PT Short Term Goal 2 (Week 1): Pt will performed bed<>chair transfer w/ supervision PT Short Term Goal 3 (Week 1): Pt will maintain dynamic standing balance w/ supervision   Skilled Therapeutic Interventions/Progress Updates:   Session 1:  Pt in w/c and agreeable to therapy, no c/o pain. Donned socks and shoes w/ supervision and increased time 2/2 UE incoordination. Pt ambulated to/from therapy gym w/ CGA using RW. Worked on RLE NMR this session. Supine bridge 3x10, resisted hip abduction in hooklying 3x10 w/ orange theraband. Tactile and verbal cues for correct form. 5x sit<>stands x3 w/o UE support to stabilize in stance, CGA w/ verbal and visual cues for anterior trunk weight shifting during transitions and to not brace LEs against mat. Ambulated 100' w/o AD to work on balance w/ gait, CGA-min assist and improved from yesterday's session. Returned to room and ended session in supine, all needs in reach.   Session 2:  Pt in supine and agreeable to therapy, no c/o pain but has not received her lunch tray yet. Provided w/ snack and sat EOB w/ supervision while eating and discussing visual remediation exercises. Provided pt w/ written handout for R visual scanning/smooth pursuits. Placed visual cues on wall in front of bed for her to practice. Pt w/ slightly improved scanning into R visual field. Still continues to need head turns to observe R visual field other than periphery. Maintain dynamic sitting balance well throughout session w/ head turns, trunk turns, and reaching anteriorly and laterally. Assisted w/ set-up of lunch tray when it did come,  verbal and visual cues for fine motor control w/ bimanual tasks including opening packets, cutting food, and opening drinks. Pt w/ low frustration tolerance w/ fine motor tasks this afternoon, educated on importance of continuing to try to build up the coordination and strength and to take her time. Ongoing education of stroke deficits and stroke recovery. Ended session sitting in w/c, all needs in reach. Missed 20 min of skilled PT 2/2 lunch tray.   Therapy Documentation Precautions:  Precautions Precautions: Fall Restrictions Weight Bearing Restrictions: No Vital Signs: Therapy Vitals Temp: 97.9 F (36.6 C) Temp Source: Oral Pulse Rate: 63 Resp: 17 BP: 115/73 Patient Position (if appropriate): Lying Oxygen Therapy SpO2: 96 % O2 Device: Room Air Pain: Pain Assessment Pain Scale: 0-10 Pain Score: 3    Therapy/Group: Individual Therapy  Adrean Findlay Melton Krebs 10/11/2018, 5:40 PM

## 2018-10-12 ENCOUNTER — Inpatient Hospital Stay (HOSPITAL_COMMUNITY): Payer: Medicare Other | Admitting: Physical Therapy

## 2018-10-12 ENCOUNTER — Inpatient Hospital Stay (HOSPITAL_COMMUNITY): Payer: Medicare Other | Admitting: Occupational Therapy

## 2018-10-12 LAB — GLUCOSE, CAPILLARY
Glucose-Capillary: 208 mg/dL — ABNORMAL HIGH (ref 70–99)
Glucose-Capillary: 269 mg/dL — ABNORMAL HIGH (ref 70–99)
Glucose-Capillary: 271 mg/dL — ABNORMAL HIGH (ref 70–99)
Glucose-Capillary: 386 mg/dL — ABNORMAL HIGH (ref 70–99)

## 2018-10-12 NOTE — Progress Notes (Signed)
Physical Therapy Session Note  Patient Details  Name: Casey Harrell MRN: [id number] Date of Birth: November 05, 1934  Today's Date: 10/12/2018 PT Individual Time: 1010-1105 PT Individual Time Calculation (min): 55 min   Short Term Goals: Week 1:  PT Short Term Goal 1 (Week 1): Pt will ambulate 63' w/ supervision in household environment  PT Short Term Goal 2 (Week 1): Pt will performed bed<>chair transfer w/ supervision PT Short Term Goal 3 (Week 1): Pt will maintain dynamic standing balance w/ supervision   Skilled Therapeutic Interventions/Progress Updates:   Pt received supine in bed and agreeable to PT. Supine>sit transfer with supervision assist and min cues for safety. Pt donned shoes sitting EOB with supervision assist from PT.   Gait training with RW 2x 251f to and from rehab gym with supervision assist. Gait training without AD 2 x 1051fand min assist from PT. Forward/backward gait training without AD 3 x 1243fith min assist. Side stepping R and L 3 x 10 bil . Min assist from PT for improved L weight shift and cues for decreased compensations. At trunk and improved pelvic control.   Stepping over 1 inch obstacle with RW to force increased step length x 8 Bil with RW. Foot taps on 4 inch step x 8 Bil. attempted without AD, and noted to have R knee buckle. Step ups on 4 inch step x 5 bil. Lateral foot taps on 4 inch step x 8 bil with UE support on RW.   Throughout treatment pt performed all sit<>stand and stand pivot transfers with supervision assist from PT for safety with cues for proper use of AD in preparation for transfer. [facility] returned to room and [facility] and all needs met.         Therapy Documentation Precautions:  Precautions Precautions: Fall Restrictions Weight Bearing Restrictions: No   Pain: Pain Assessment Pain Scale: 0-10 Pain Score: 4  Pain Type: Acute pain Pain Location: Ear Pain Orientation: Right Pain Descriptors /  Indicators: Sore Pain Onset: Gradual Pain Intervention(s): Medication (See eMAR)  Therapy/Group: Individual Therapy  AusLorie Phenix6/2020, 11:03 AM

## 2018-10-12 NOTE — Plan of Care (Signed)
  Problem: Consults Goal: RH STROKE PATIENT EDUCATION Description See Patient Education module for education specifics  Outcome: Progressing Goal: Nutrition Consult-if indicated Outcome: Progressing Goal: Diabetes Guidelines if Diabetic/Glucose > 140 Description If diabetic or lab glucose is > 140 mg/dl - Initiate Diabetes/Hyperglycemia Guidelines & Document Interventions  Outcome: Progressing   Problem: RH BOWEL ELIMINATION Goal: RH STG MANAGE BOWEL WITH ASSISTANCE Description STG Manage Bowel with mod I Assistance.  Outcome: Progressing   Problem: RH BLADDER ELIMINATION Goal: RH STG MANAGE BLADDER WITH ASSISTANCE Description STG Manage Bladder With mod I Assistance  Outcome: Progressing   Problem: RH SKIN INTEGRITY Goal: RH STG MAINTAIN SKIN INTEGRITY WITH ASSISTANCE Description STG Maintain Skin Integrity With mod I  Assistance.  Outcome: Progressing   Problem: RH SAFETY Goal: RH STG ADHERE TO SAFETY PRECAUTIONS W/ASSISTANCE/DEVICE Description STG Adhere to Safety Precautions With mod I Assistance/Device.  Outcome: Progressing Goal: RH STG DECREASED RISK OF FALL WITH ASSISTANCE Description STG Decreased Risk of Fall With mod I Assistance.  Outcome: Progressing   Problem: RH PAIN MANAGEMENT Goal: RH STG PAIN MANAGED AT OR BELOW PT'S PAIN GOAL Description Pain scale <4/10  Outcome: Progressing   Problem: Consults Goal: Skin Care Protocol Initiated - if Braden Score 18 or less Description If consults are not indicated, leave blank or document N/A Outcome: Progressing   Problem: RH BOWEL ELIMINATION Goal: RH STG MANAGE BOWEL W/MEDICATION W/ASSISTANCE Description STG Manage Bowel with Medication with Assistance. Outcome: Progressing   Problem: RH BLADDER ELIMINATION Goal: RH STG MANAGE BLADDER WITH ASSISTANCE Description STG Manage Bladder With mod I Assistance  Outcome: Progressing   Problem: RH SKIN INTEGRITY Goal: RH STG SKIN FREE OF  INFECTION/BREAKDOWN Outcome: Progressing   Problem: RH SAFETY Goal: RH STG ADHERE TO SAFETY PRECAUTIONS W/ASSISTANCE/DEVICE Description STG Adhere to Safety Precautions With mod I Assistance/Device.  Outcome: Progressing   Problem: RH PAIN MANAGEMENT Goal: RH STG PAIN MANAGED AT OR BELOW PT'S PAIN GOAL Description Pain scale <4/10  Outcome: Progressing   Problem: RH KNOWLEDGE DEFICIT GENERAL Goal: RH STG INCREASE KNOWLEDGE OF SELF CARE AFTER HOSPITALIZATION Outcome: Progressing   Problem: RH Vision Goal: RH LTG Vision (Specify) Outcome: Progressing

## 2018-10-12 NOTE — Progress Notes (Signed)
Physical Therapy Session Note  Patient Details  Name: Casey Harrell MRN: [id number] Date of Birth: July 13, 1934  Today's Date: 10/12/2018 PT Individual Time: 4098-1191 PT Individual Time Calculation (min): 58 min   Short Term Goals: Week 1:  PT Short Term Goal 1 (Week 1): Pt will ambulate 4' w/ supervision in household environment  PT Short Term Goal 2 (Week 1): Pt will performed bed<>chair transfer w/ supervision PT Short Term Goal 3 (Week 1): Pt will maintain dynamic standing balance w/ supervision   Skilled Therapeutic Interventions/Progress Updates:   Pt received asleep, supine in bed and agreeable to therapy session upon awakening. Supine>sit, HOB partially elevated, with supervision and increased time for trunk upright. Sit<>stand EOB/chair/toilet<>RW with CGA for steadying throughout session. Ambulated ~34ft to bathroom using RW with CGA for steadying. Continent of urine, performed seated peri-care and standing hand hygiene at sink with supervision for balance and education on proper AD management at counters. Ambulated ~2109ft to gym using RW with CGA for steadying and pt demonstrating decreased R LE foot clearance with hip adduction compensation for hip flexor weakness and AD management too far anterior - cuing throughout for improved gait mechanics. Ascended/descended 8 steps using bilateral handrails and min assist for balance - cuing for step-to pattern and involved vs uninvolved LE leading. Ambulated using RW through cone weaving with CGA for steadying throughout - pt with increased fatigue after performing task. Repeated sit<>stand EOM<>no UE support with R LE bias 3x10 repetitions and CGA for steadying - manual facilitation and cuing for anterior trunk lean and hip/knee extension for improved form. Repeated R LE step up/down on 4" step with B UE support on RW and manual facilitation/multimodal cuing for increased gluteal and quad muscle activation. Ambulated ~276ft back to room using RW with  CGA and pt continuing to demonstrate poor R LE foot clearance - provided manual facilitation for increased hip flexor activation. Sit>supine with supervision. Pt left supine in bed with needs in reach and bed alarm on.  Therapy Documentation Precautions:  Precautions Precautions: Fall Restrictions Weight Bearing Restrictions: No  Pain:  Reports R eye discomfort/bluriness that increased with sunlight in the main therapy gym - therapist closed blinds for increased pt comfort.   Therapy/Group: Individual Therapy  Tawana Scale, PT, DPT 10/12/2018, 3:31 PM

## 2018-10-12 NOTE — Progress Notes (Signed)
Casey Harrell is a 84 y.o. female June 22, 1934 [id number]  Subjective: Patient reports pain around exterior pinna of right ear.  No injury, swelling or hearing changes.  Reports ongoing since stroke.  Denies other pain or new weakness  Objective: Vital signs in last 24 hours: Temp:  [97.9 F (36.6 C)-100.9 F (38.3 C)] 98.4 F (36.9 C) (06/06 0440) Pulse Rate:  [63-81] 81 (06/06 0440) Resp:  [1-18] 18 (06/06 0440) BP: (115-152)/(60-75) 152/75 (06/06 0440) SpO2:  [91 %-96 %] 91 % (06/06 0440) Weight change:  Last BM Date: 10/11/18  Intake/Output from previous day: 06/05 0701 - 06/06 0700 In: 718 [P.O.:718] Out: -   Physical Exam General: No apparent distress   right ear appears normal without erythema, swelling or discoloration.  Reports tenderness to palpation but not specifically reproducible by touch Lungs: Normal effort. Lungs clear to auscultation, no crackles or wheezes. Cardiovascular: Regular rate and rhythm, no edema Neurological: No new neurological deficits   Lab Results: BMET    Component Value Date/Time   NA 138 10/09/2018 0624   K 4.3 10/09/2018 0624   CL 100 10/09/2018 0624   CO2 28 10/09/2018 0624   GLUCOSE 230 (H) 10/09/2018 0624   BUN 18 10/09/2018 0624   CREATININE 1.08 (H) 10/09/2018 0624   CREATININE 0.84 04/19/2012 1700   CALCIUM 9.7 10/09/2018 0624   GFRNONAA 48 (L) 10/09/2018 0624   GFRAA 56 (L) 10/09/2018 0624   CBC    Component Value Date/Time   WBC 9.6 10/09/2018 0624   RBC 4.26 10/09/2018 0624   HGB 12.8 10/09/2018 0624   HCT 38.2 10/09/2018 0624   PLT 261 10/09/2018 0624   MCV 89.7 10/09/2018 0624   MCH 30.0 10/09/2018 0624   MCHC 33.5 10/09/2018 0624   RDW 13.5 10/09/2018 0624   LYMPHSABS 2.9 10/09/2018 0624   MONOABS 0.9 10/09/2018 0624   EOSABS 0.5 10/09/2018 0624   BASOSABS 0.1 10/09/2018 0624   CBG's (last 3):   Recent Labs    10/11/18 1956 10/12/18 0630 10/12/18 1217  GLUCAP 282* 208* 269*   LFT's Lab Results   Component Value Date   ALT 13 10/09/2018   AST 12 (L) 10/09/2018   ALKPHOS 88 10/09/2018   BILITOT 0.6 10/09/2018    Studies/Results: No results found.  Medications:  I have reviewed the patient's current medications. Scheduled Medications: . amLODipine  2.5 mg Oral Daily  . apixaban  5 mg Oral BID  . atorvastatin  20 mg Oral Daily  . diclofenac sodium  2 g Topical QID  . DULoxetine  30 mg Oral BID  . fluticasone  1 spray Each Nare Daily  . insulin aspart  0-9 Units Subcutaneous TID WC  . insulin aspart  3 Units Subcutaneous TID WC  . insulin detemir  20 Units Subcutaneous BID  . levothyroxine  50 mcg Oral Q0600  . pantoprazole  40 mg Oral BID  . pregabalin  150 mg Oral Daily   PRN Medications: acetaminophen **OR** acetaminophen (TYLENOL) oral liquid 160 mg/5 mL **OR** acetaminophen, albuterol, ipratropium-albuterol, polyvinyl alcohol, senna-docusate, sorbitol  Assessment/Plan: Active Problems:   Right pontine CVA (HCC)   AKI (acute kidney injury) (HCC)   Diabetes mellitus type 2 in nonobese (HCC)   PAF (paroxysmal atrial fibrillation) (HCC)   Reactive hypertension   1.  Right paramedial dorsal pons stroke with subsequent functional deficits.  Right facial droop and mild right hemiparesis.  Continue interdisciplinary therapy and comprehensive inpatient rehab setting as ongoing with medical  and supportive care reviewed. 2.  PAF.  Rate controlled and anticoagulation with Eliquis. 3 neurogenic pain.  Continue Lyrica and Cymbalta as prescribed. 4.  Type 2 diabetes, poor control with A1c 9.6 prior to admission.  Recent titration and adjustments reviewed.  Continue same with monitoring CBGs.  Low-carb diet encouraged. 5.  Hypothyroidism.  Continue Synthroid. 6.  COPD with bronchospasm.  Currently asymptomatic.  Continue nebs as ordered. 7.  Hypertension.  Elevated and labile blood pressures reviewed.  Recent initiation of amlodipine reviewed.  Continue monitoring with further  titration as needed.  Length of stay, days: 4  Kennah Hehr A. Felicity Coyer, MD 10/12/2018, 1:30 PM

## 2018-10-12 NOTE — Progress Notes (Signed)
Occupational Therapy Session Note  Patient Details  Name: Casey Harrell MRN: [id number] Date of Birth: 30-Jan-1935  Today's Date: 10/12/2018 OT Individual Time: 1300-1400 OT Individual Time Calculation (min): 60 min    Short Term Goals: Week 1:  OT Short Term Goal 1 (Week 1): patient will perform all adl tasks at a S level OT Short Term Goal 2 (Week 1): patient will perform functional transfers with CS using approp DME/AD OT Short Term Goal 3 (Week 1): patient will carryover visual motor exercises with min cues  Skilled Therapeutic Interventions/Progress Updates:    Upon entering the room, pt seated in wheelchair and eating lunch. Pt with no c/o pain this session and requesting to finish meal. Pt utilized B hand coordination to open all containers, packets, and cut food with increased time and encouragement. When finished, pt ambulating with min guard without use of AD to sink to brush teeth while standing with close supervision for balance. Pt utilizing brush to get pocketed food from R cheek. Pt then verbalized need for toileting and transferred onto commode chair with min guard. Pt having BM and performed hygiene and clothing management with steady assistance before returning to bed. Pt in bed with bed alarm activated and call bell within reach.   Therapy Documentation Precautions:  Precautions Precautions: Fall Restrictions Weight Bearing Restrictions: No General: ADL: ADL Eating: Supervision/safety Where Assessed-Eating: Wheelchair Grooming: Contact guard Where Assessed-Grooming: Standing at sink Upper Body Bathing: Supervision/safety Where Assessed-Upper Body Bathing: Shower Lower Body Bathing: Minimal assistance Where Assessed-Lower Body Bathing: Shower Upper Body Dressing: Supervision/safety Where Assessed-Upper Body Dressing: Wheelchair Lower Body Dressing: Minimal assistance Where Assessed-Lower Body Dressing: Wheelchair Toileting: Contact guard Where Assessed-Toileting:  Glass blower/designer: Therapist, music Method: Counselling psychologist: Energy manager: Curator Method: Heritage manager: Civil engineer, contracting with back   Therapy/Group: Individual Therapy  Gypsy Decant 10/12/2018, 4:49 PM

## 2018-10-13 LAB — GLUCOSE, CAPILLARY
Glucose-Capillary: 212 mg/dL — ABNORMAL HIGH (ref 70–99)
Glucose-Capillary: 306 mg/dL — ABNORMAL HIGH (ref 70–99)
Glucose-Capillary: 286 mg/dL — ABNORMAL HIGH (ref 70–99)
Glucose-Capillary: 311 mg/dL — ABNORMAL HIGH (ref 70–99)

## 2018-10-13 NOTE — Progress Notes (Signed)
Casey Harrell is a 84 y.o. female [birth year] [id number]  Subjective: Reports resolution of all pain on right side of hear/ear. Feels great. Denies other problems or concerns  Objective: Vital signs in last 24 hours: Temp:  [97.8 F (36.6 C)-98.9 F (37.2 C)] 98.9 F (37.2 C) (06/07 0406) Pulse Rate:  [66-67] 66 (06/07 0406) Resp:  [18] 18 (06/07 0406) BP: (151-153)/(65-73) 151/65 (06/07 0406) SpO2:  [92 %-97 %] 92 % (06/07 0406) Weight change:  Last BM Date: 10/12/18  Intake/Output from previous day: 06/06 0701 - 06/07 0700 In: 960 [P.O.:960] Out: -   Physical Exam General: No apparent distress     Lungs: Normal effort. Lungs clear to auscultation, no crackles or wheezes. Cardiovascular: Regular rate and rhythm, no edema Neurological: No new neurological deficits   Lab Results: BMET    Component Value Date/Time   NA 138 10/09/2018 0624   K 4.3 10/09/2018 0624   CL 100 10/09/2018 0624   CO2 28 10/09/2018 0624   GLUCOSE 230 (H) 10/09/2018 0624   BUN 18 10/09/2018 0624   CREATININE 1.08 (H) 10/09/2018 0624   CREATININE 0.84 04/19/2012 1700   CALCIUM 9.7 10/09/2018 0624   GFRNONAA 48 (L) 10/09/2018 0624   GFRAA 56 (L) 10/09/2018 0624   CBC    Component Value Date/Time   WBC 9.6 10/09/2018 0624   RBC 4.26 10/09/2018 0624   HGB 12.8 10/09/2018 0624   HCT 38.2 10/09/2018 0624   PLT 261 10/09/2018 0624   MCV 89.7 10/09/2018 0624   MCH 30.0 10/09/2018 0624   MCHC 33.5 10/09/2018 0624   RDW 13.5 10/09/2018 0624   LYMPHSABS 2.9 10/09/2018 0624   MONOABS 0.9 10/09/2018 0624   EOSABS 0.5 10/09/2018 0624   BASOSABS 0.1 10/09/2018 0624   CBG's (last 3):   Recent Labs    10/12/18 1718 10/12/18 1948 10/13/18 0612  GLUCAP 271* 386* 212*   LFT's Lab Results  Component Value Date   ALT 13 10/09/2018   AST 12 (L) 10/09/2018   ALKPHOS 88 10/09/2018   BILITOT 0.6 10/09/2018    Studies/Results: No results found.  Medications:  I have reviewed the patient's  current medications. Scheduled Medications: . amLODipine  2.5 mg Oral Daily  . apixaban  5 mg Oral BID  . atorvastatin  20 mg Oral Daily  . diclofenac sodium  2 g Topical QID  . DULoxetine  30 mg Oral BID  . fluticasone  1 spray Each Nare Daily  . insulin aspart  0-9 Units Subcutaneous TID WC  . insulin aspart  3 Units Subcutaneous TID WC  . insulin detemir  20 Units Subcutaneous BID  . levothyroxine  50 mcg Oral Q0600  . pantoprazole  40 mg Oral BID  . pregabalin  150 mg Oral Daily   PRN Medications: acetaminophen **OR** acetaminophen (TYLENOL) oral liquid 160 mg/5 mL **OR** acetaminophen, albuterol, ipratropium-albuterol, polyvinyl alcohol, senna-docusate, sorbitol  Assessment/Plan: Active Problems:   Right pontine CVA (HCC)   AKI (acute kidney injury) (HCC)   Diabetes mellitus type 2 in nonobese (HCC)   PAF (paroxysmal atrial fibrillation) (HCC)   Reactive hypertension   1.  Right paramedial dorsal pons stroke with subsequent functional deficits.  Right facial droop and mild right hemiparesis.  Continue interdisciplinary therapy and comprehensive inpatient rehab setting as ongoing with medical and supportive care reviewed. 2.  PAF.  Rate controlled and anticoagulation with Eliquis. 3. Type 2 diabetes, poor control with A1c 9.6 prior to admission.  Recent titration  and adjustments reviewed.  Continue same with monitoring CBGs.  Low-carb diet encouraged. 4.  Hypothyroidism.  Continue Synthroid. 5.  COPD with bronchospasm.  Currently asymptomatic.  Continue nebs as ordered. 6.  Hypertension.  Elevated and labile blood pressures reviewed.  Recent initiation of amlodipine reviewed.  Continue monitoring with further titration as needed.  Length of stay, days: 5  Tanveer Dobberstein A. Felicity Coyer, MD 10/13/2018, 9:30 AM

## 2018-10-13 NOTE — Plan of Care (Signed)
  Problem: Consults Goal: RH STROKE PATIENT EDUCATION Description See Patient Education module for education specifics  Outcome: Progressing Goal: Nutrition Consult-if indicated Outcome: Progressing Goal: Diabetes Guidelines if Diabetic/Glucose > 140 Description If diabetic or lab glucose is > 140 mg/dl - Initiate Diabetes/Hyperglycemia Guidelines & Document Interventions  Outcome: Progressing   Problem: RH BOWEL ELIMINATION Goal: RH STG MANAGE BOWEL WITH ASSISTANCE Description STG Manage Bowel with mod I Assistance.  Outcome: Progressing   Problem: RH BLADDER ELIMINATION Goal: RH STG MANAGE BLADDER WITH ASSISTANCE Description STG Manage Bladder With mod I Assistance  Outcome: Progressing   Problem: RH SKIN INTEGRITY Goal: RH STG MAINTAIN SKIN INTEGRITY WITH ASSISTANCE Description STG Maintain Skin Integrity With mod I  Assistance.  Outcome: Progressing   Problem: RH SAFETY Goal: RH STG ADHERE TO SAFETY PRECAUTIONS W/ASSISTANCE/DEVICE Description STG Adhere to Safety Precautions With mod I Assistance/Device.  Outcome: Progressing Goal: RH STG DECREASED RISK OF FALL WITH ASSISTANCE Description STG Decreased Risk of Fall With mod I Assistance.  Outcome: Progressing   Problem: RH PAIN MANAGEMENT Goal: RH STG PAIN MANAGED AT OR BELOW PT'S PAIN GOAL Description Pain scale <4/10  Outcome: Progressing   Problem: Consults Goal: Skin Care Protocol Initiated - if Braden Score 18 or less Description If consults are not indicated, leave blank or document N/A Outcome: Progressing   Problem: RH BOWEL ELIMINATION Goal: RH STG MANAGE BOWEL W/MEDICATION W/ASSISTANCE Description STG Manage Bowel with Medication with Assistance. Outcome: Progressing   Problem: RH BLADDER ELIMINATION Goal: RH STG MANAGE BLADDER WITH ASSISTANCE Description STG Manage Bladder With mod I Assistance  Outcome: Progressing   Problem: RH SKIN INTEGRITY Goal: RH STG SKIN FREE OF  INFECTION/BREAKDOWN Outcome: Progressing   Problem: RH SAFETY Goal: RH STG ADHERE TO SAFETY PRECAUTIONS W/ASSISTANCE/DEVICE Description STG Adhere to Safety Precautions With mod I Assistance/Device.  Outcome: Progressing   Problem: RH PAIN MANAGEMENT Goal: RH STG PAIN MANAGED AT OR BELOW PT'S PAIN GOAL Description Pain scale <4/10  Outcome: Progressing   Problem: RH KNOWLEDGE DEFICIT GENERAL Goal: RH STG INCREASE KNOWLEDGE OF SELF CARE AFTER HOSPITALIZATION Outcome: Progressing   Problem: RH Vision Goal: RH LTG Vision (Specify) Outcome: Progressing   

## 2018-10-14 ENCOUNTER — Inpatient Hospital Stay (HOSPITAL_COMMUNITY): Payer: Medicare Other | Admitting: Occupational Therapy

## 2018-10-14 ENCOUNTER — Inpatient Hospital Stay (HOSPITAL_COMMUNITY): Payer: Medicare Other

## 2018-10-14 ENCOUNTER — Inpatient Hospital Stay (HOSPITAL_COMMUNITY): Payer: Medicare Other | Admitting: Physical Therapy

## 2018-10-14 LAB — GLUCOSE, CAPILLARY
Glucose-Capillary: 238 mg/dL — ABNORMAL HIGH (ref 70–99)
Glucose-Capillary: 279 mg/dL — ABNORMAL HIGH (ref 70–99)
Glucose-Capillary: 194 mg/dL — ABNORMAL HIGH (ref 70–99)
Glucose-Capillary: 186 mg/dL — ABNORMAL HIGH (ref 70–99)

## 2018-10-14 MED ORDER — INSULIN ASPART 100 UNIT/ML ~~LOC~~ SOLN
7.0000 [IU] | Freq: Three times a day (TID) | SUBCUTANEOUS | Status: DC
  Administered 2018-10-14 – 2018-10-21 (×21): 7 [IU] via SUBCUTANEOUS

## 2018-10-14 MED ORDER — AMLODIPINE BESYLATE 5 MG PO TABS
7.5000 mg | ORAL_TABLET | Freq: Every day | ORAL | Status: DC
  Administered 2018-10-14 – 2018-10-21 (×8): 7.5 mg via ORAL
  Filled 2018-10-14 (×7): qty 1

## 2018-10-14 NOTE — Plan of Care (Signed)
OOB MIN ASSIST  WITH WALKER, TO BR, TOILETS INDEPENDENTLY.

## 2018-10-14 NOTE — Progress Notes (Signed)
Physical Therapy Session Note  Patient Details  Name: Casey Harrell MRN: [id number] Date of Birth: [birth year]  Today's Date: 10/14/2018 PT Individual Time: 0800-0913 PT Individual Time Calculation (min): 73 min   Short Term Goals: Week 1:  PT Short Term Goal 1 (Week 1): Pt will ambulate 13' w/ supervision in household environment  PT Short Term Goal 2 (Week 1): Pt will performed bed<>chair transfer w/ supervision PT Short Term Goal 3 (Week 1): Pt will maintain dynamic standing balance w/ supervision   Skilled Therapeutic Interventions/Progress Updates:   Pt in supine and agreeable to therapy, no c/o pain. Supine>sit w/ independence. Donned socks and shoes w/ supervision at EOB. Ambulated to/from sink w/ supervision using RW to perform self-care at sink prior to leaving room. Ambulated to/from therapy gym w/ close supervision using RW. Worked on dynamic balance tasks this session. Ambulated 100' w/o AD, CGA w/ verbal cues for gait pattern. Pt able to self-correct and realize that she was speeding up w/ her feet getting too far in front of her. Ambulated around cones w/ RW and w/o AD, CGA for safety but no overt LOB. Performed Berg Balance Scale as detailed below and explained significance of results to pt, including increased fall risk and importance of use of RW for support. Worked on single leg balance w/o UE support while performing alternating 4" step taps and step taps to various visual targets in all directions on level ground. Min assist overall w/ 1 LOB requiring up to max assist to correct. Ambulated 150' x2 w/o AD, CGA in controlled environment. Verbal and tactile cues for upright posture to decrease forward momentum and risk of falling forward. Reviewed visual remediation exercises including R horizontal smooth pursuits and visual fixation w/ L head turns, pt able to maintain fixation through ~25-50% of R horizontal tracking ROM. Much improved since therapist observed attempts 3 days ago. Pt  performing exercises correctly. Pt remains unable to close R eyelid w/o manual assist. Provided w/ word search book to work on visual scanning and smooth pursuits. Returned to room and ended session sitting EOB independently, all needs in reach.   Therapy Documentation Precautions:  Precautions Precautions: Fall Restrictions Weight Bearing Restrictions: No Vital Signs:  Balance: Standardized Balance Assessment Standardized Balance Assessment: Berg Balance Test Berg Balance Test Sit to Stand: Able to stand without using hands and stabilize independently Standing Unsupported: Able to stand 2 minutes with supervision Sitting with Back Unsupported but Feet Supported on Floor or Stool: Able to sit safely and securely 2 minutes Stand to Sit: Sits safely with minimal use of hands Transfers: Able to transfer safely, minor use of hands Standing Unsupported with Eyes Closed: Able to stand 10 seconds with supervision Standing Ubsupported with Feet Together: Needs help to attain position but able to stand for 30 seconds with feet together From Standing, Reach Forward with Outstretched Arm: Can reach forward >5 cm safely (2") From Standing Position, Pick up Object from Floor: Able to pick up shoe, needs supervision From Standing Position, Turn to Look Behind Over each Shoulder: Needs supervision when turning Turn 360 Degrees: Needs close supervision or verbal cueing Standing Unsupported, Alternately Place Feet on Step/Stool: Able to complete >2 steps/needs minimal assist Standing Unsupported, One Foot in Front: Able to take small step independently and hold 30 seconds Standing on One Leg: Unable to try or needs assist to prevent fall Total Score: 33  Therapy/Group: Individual Therapy  Kena Limon Clent Demark 10/14/2018, 9:37 AM

## 2018-10-14 NOTE — Progress Notes (Signed)
Physical Therapy Session Note  Patient Details  Name: Casey Harrell MRN: [id number] Date of Birth: 02-22-1935  Today's Date: 10/14/2018 PT Individual Time: 1510-1610 PT Individual Time Calculation (min): 60 min   Short Term Goals: Week 1:  PT Short Term Goal 1 (Week 1): Pt will ambulate 13' w/ supervision in household environment  PT Short Term Goal 2 (Week 1): Pt will performed bed<>chair transfer w/ supervision PT Short Term Goal 3 (Week 1): Pt will maintain dynamic standing balance w/ supervision       Skilled Therapeutic Interventions/Progress Updates:   Pt resting in bed.  Neuromuscular re-education in supine via multimodal cues for bil bridging focusing on eccentric, smooth motion; lower trunk rotation; bil hip adductor squeezes; cervical flexion in hook lying position.  Sustained stretch R/L abdominal obliques via positioning. In sitting, sustained stretch bil hip external rotators with footstool between feet, belt around thighs to adduct them, to address narrow BOS in standing. Seated- bil heel raises/toe raises, focusing on slow full ROM.  Gait without AD x 70' with 2 turns, min assist for generalized unsteadiness.  Forced use wt bearing RLE during L step/taps onto 6" high step, and non-wt bearing motor control for R step/taps onto 6" high step.  Pt reported she needed to use toilet.  In flat bed, no rails, min assist to sit EOB to R.  Mod cues for technique. Hand washing in standing with supervision.   Sit> stand with RW with CGA and cues for safe hand placement.    Gait in room with RW to toilet for continent voiding.  Toileting with supervision.  Up/down (4) 6" high steps bil rails, min assist, with pt remembering sequencing without cueing.  Advanced gait with RW, backwards, x 10',  X 5' with heels bumping q step.  Cues and min assist needed.  At end of session, pt left sitting up in w/c with needs at hand and seat pad alarm set.  No c/o vertigo with movements during this  session.       Therapy Documentation Precautions:  Precautions Precautions: Fall Restrictions Weight Bearing Restrictions: No  Pain: pt denies         Therapy/Group: Individual Therapy  Ercole Georg 10/14/2018, 4:24 PM

## 2018-10-14 NOTE — Progress Notes (Signed)
Casey Harrell  Subjective/Complaints: Patient seen sitting up in bed this morning.  She states she slept well overnight.  She states she had a good weekend.  She notes improvement in headaches.  ROS: + Visual deficits.  Denies chest pain, shortness of breath, nausea, vomiting, diarrhea.  Objective: Vital Signs: Blood pressure (!) 169/76, pulse 64, temperature 97.7 F (36.5 C), temperature source Oral, resp. rate 18, height 5\' 1"  (1.549 m), weight 60.4 kg, SpO2 96 %. No results found. No results for input(s): WBC, HGB, HCT, PLT in the last 72 hours. No results for input(s): NA, K, CL, CO2, GLUCOSE, BUN, CREATININE, CALCIUM in the last 72 hours.  Physical Exam: BP (!) 169/76 (BP Location: Right Arm) Comment: notified rn   Pulse 64   Temp 97.7 F (36.5 C) (Oral)   Resp 18   Ht 5\' 1"  (1.549 m)   Wt 60.4 kg   SpO2 96%   BMI 25.16 kg/m  Constitutional: No distress . Vital signs reviewed. HENT: Normocephalic.  Atraumatic. Eyes: No discharge.  Cannot move eyes past midline to the right visual fields, unchanged Cardiovascular: No JVD. Respiratory: Normal effort. GI: Non-distended. Musc: No edema or tenderness in extremities. Neurological: Alert and oriented  Makes good eye contact with examiner and follows commands.  Good awareness of deficits Right facial weakness, stable Unable to close right eye Motor: RUE/RLE 5/5 prox to distal.  LUE/LLE 5/5, stable.   Skin: Skin iswarmand dry.  Psychiatric: She has anormal mood and affect. Herbehavior is normal.Judgmentand thought contentnormal.   Assessment/Plan: 1. Functional deficits secondary to right paramedial dorsal pons punctate infarct which require 3+ hours per day of interdisciplinary therapy in a comprehensive inpatient rehab setting.  Physiatrist is providing close team supervision and 24 hour management of active medical problems listed below.  Physiatrist and rehab team  continue to assess barriers to discharge/monitor patient progress toward functional and medical goals  Care Tool:  Bathing    Body parts bathed by patient: Left arm, Right arm, Chest, Abdomen, Front perineal area, Buttocks, Right upper leg, Left upper leg, Right lower leg, Left lower leg, Face   Body parts bathed by helper: Right lower leg, Left lower leg, Buttocks     Bathing assist Assist Level: Supervision/Verbal cueing     Upper Body Dressing/Undressing Upper body dressing Upper body dressing/undressing activity did not occur (including orthotics): N/A What is the patient wearing?: Pull over shirt    Upper body assist Assist Level: Supervision/Verbal cueing    Lower Body Dressing/Undressing Lower body dressing    Lower body dressing activity did not occur: N/A What is the patient wearing?: Underwear/pull up, Pants     Lower body assist Assist for lower body dressing: Supervision/Verbal cueing     Toileting Toileting    Toileting assist Assist for toileting: Contact Guard/Touching assist     Transfers Chair/bed transfer  Transfers assist     Chair/bed transfer assist level: Contact Guard/Touching assist     Locomotion Ambulation   Ambulation assist      Assist level: Contact Guard/Touching assist Assistive device: Walker-rolling Max distance: 273ft   Walk 10 feet activity   Assist     Assist level: Contact Guard/Touching assist Assistive device: Walker-rolling   Walk 50 feet activity   Assist    Assist level: Contact Guard/Touching assist Assistive device: Walker-rolling    Walk 150 feet activity   Assist Walk 150 feet activity did not occur: Safety/medical concerns  Assist level:  Contact Guard/Touching assist Assistive device: Walker-rolling    Walk 10 feet on uneven surface  activity   Assist Walk 10 feet on uneven surfaces activity did not occur: Safety/medical concerns         Wheelchair     Assist Will patient  use wheelchair at discharge?: No   Wheelchair activity did not occur: N/A         Wheelchair 50 feet with 2 turns activity    Assist    Wheelchair 50 feet with 2 turns activity did not occur: N/A       Wheelchair 150 feet activity     Assist Wheelchair 150 feet activity did not occur: N/A          Medical Problem List and Plan: 1.Right side weakness with facial droopsecondary to acute punctate infarct right paramedial dorsal level of pons as well old cortical infarcts in the occipital lobes more extensive on the left than on the right  Continue CIR  Weekend notes reviewed-stable 2. Antithrombotics: -DVT/anticoagulation:Eliquis -antiplatelet therapy: N/A 3. Pain Management:Lyrica 150 mg daily, Cymbalta 30 mg twice daily 4. Mood:Provide emotional support -antipsychotic agents: N/A 5. Neuropsych: This patientiscapable of making decisions on hisown behalf. 6. Skin/Wound Care:Routine skin checks 7. Fluids/Electrolytes/Nutrition:Routine in and outs  8.PAF. Monitor with increased activity during therapy  Rate controlled on 6/8  Monitor with increased mobility 9.Diabetes mellitus. Hemoglobin A1c 9.6.   NovoLog 3 units 3 times daily, increased to 7 3 times daily on 6/8  Levemir increased to 20 twice daily on 6/4.   Check blood sugars before meals and at bedtime.   Diabetic teaching by team  Monitor with increased mobility 10. COPD/asthma. Continue nebulizers as directed 11. Hypothyroidism. Synthroid 12. GERD. Protonix 13.  AKI versus CKD  Creatinine 1.08 on 6/3  Continue to monitor 14.  Hypertension:   Norvasc 2.5 started on 6/4, increased to 7.5 on 6/8  Labile, but elevated for the most part  Monitor with increased mobility   LOS: 6 days A FACE TO FACE EVALUATION WAS PERFORMED  Casey Harrell 10/14/2018, 8:49 AM

## 2018-10-14 NOTE — Progress Notes (Signed)
Occupational Therapy Session Note  Patient Details  Name: Casey Harrell MRN: [id number] Date of Birth: 24-Dec-1934  Today's Date: 10/14/2018 OT Individual Time: 5176-1607 OT Individual Time Calculation (min): 74 min    Short Term Goals: Week 1:  OT Short Term Goal 1 (Week 1): patient will perform all adl tasks at a S level OT Short Term Goal 2 (Week 1): patient will perform functional transfers with CS using approp DME/AD OT Short Term Goal 3 (Week 1): patient will carryover visual motor exercises with min cues  Skilled Therapeutic Interventions/Progress Updates:    Pt completed shower and dressing to start session.  She was able to ambulate with min guard assist to the shower seat without use of an assistive device.  Undressing and all bathing was completed with supervision sit to stand.  She was able to complete dressing at supervision level as well sit to stand at the sink.  She stood with min guard assist for completion of grooming tasks without UE support, such as oral hygiene and applying lotion to her UB.  She sat to apply it to her legs. She next completed toilet transfer and toileting with min guard assist, before ambulating down to the therapy gym with use of the RW and close supervision.  Mod instructional cueing for staying closer to the walker and for hand placement sit to stand.  Once in the therapy gym, had pt transfer to the therapy mat and work from the mat on standing balance and UE use.  Placed foam surface underneath her feet for work on standing while engaged in FM coordination task of picking up and placing pegs in a grid.  Incorporated functional reaching in different directions with each UE.  LOB forward 20% of the time, especially when reaching up.  Also had her stand statically on the foam with some completion of head rotation and cervical movements of flexion and extension.  LOB forward still noted when standing on the foam, however no LOB when standing on the solid ground.   Finished session with ambulation back to the room and pt transferring to bed to rest with supervision.  Call button and phone in reach with bed alarm in place.    Therapy Documentation Precautions:  Precautions Precautions: Fall Restrictions Weight Bearing Restrictions: No  Pain: Pain Assessment Pain Scale: Faces Pain Score: 0-No pain ADL: See Care Tool Section for some details of ADL  Therapy/Group: Individual Therapy  Hermon Zea OTR/L 10/14/2018, 12:32 PM

## 2018-10-15 ENCOUNTER — Inpatient Hospital Stay (HOSPITAL_COMMUNITY): Payer: Medicare Other | Admitting: Occupational Therapy

## 2018-10-15 ENCOUNTER — Inpatient Hospital Stay (HOSPITAL_COMMUNITY): Payer: Medicare Other | Admitting: Physical Therapy

## 2018-10-15 LAB — GLUCOSE, CAPILLARY
Glucose-Capillary: 238 mg/dL — ABNORMAL HIGH (ref 70–99)
Glucose-Capillary: 169 mg/dL — ABNORMAL HIGH (ref 70–99)
Glucose-Capillary: 296 mg/dL — ABNORMAL HIGH (ref 70–99)
Glucose-Capillary: 222 mg/dL — ABNORMAL HIGH (ref 70–99)

## 2018-10-15 MED ORDER — INSULIN DETEMIR 100 UNIT/ML ~~LOC~~ SOLN
25.0000 [IU] | Freq: Two times a day (BID) | SUBCUTANEOUS | Status: DC
  Administered 2018-10-15 – 2018-10-17 (×4): 25 [IU] via SUBCUTANEOUS
  Filled 2018-10-15 (×5): qty 0.25

## 2018-10-15 NOTE — Progress Notes (Signed)
Occupational Therapy Session Note  Patient Details  Name: Casey Harrell MRN: [id number] Date of Birth: [birth year]  Today's Date: 10/15/2018 OT Individual Time: 8184-0375 OT Individual Time Calculation (min): 39 min   Short Term Goals: Week 1:  OT Short Term Goal 1 (Week 1): patient will perform all adl tasks at a S level OT Short Term Goal 2 (Week 1): patient will perform functional transfers with CS using approp DME/AD OT Short Term Goal 3 (Week 1): patient will carryover visual motor exercises with min cues  Skilled Therapeutic Interventions/Progress Updates:    Pt greeted seated in wc and agreeable to OT treatment session. Pt requests to shower this morning. Pt ambulatory throughout session with RW and close supervision, intermittent CGA for balance with turns. Pt completed bathing sit<>stand from shower seat with supervision overall. Pt ambulated out of shower CGA with verbal cues for RW position and hand placement. Dressing completed from wc with improved fine motor control to clasp bra today. Grooming tasks completed standing at the sink with supervision. Pt ambulated 75 feet with RW and verbal cues for R LE clearance. Pt left seated in wc at end of session with call bell in reach, chair alarm on, and needs met.   Therapy Documentation Precautions:  Precautions Precautions: Fall Restrictions Weight Bearing Restrictions: No Pain:  none denies pain   Therapy/Group: Individual Therapy  Valma Cava 10/15/2018, 9:27 AM

## 2018-10-15 NOTE — Progress Notes (Signed)
Patient slept well throughout the night. Awakening for toileting. No complaints noted throughout the night.

## 2018-10-15 NOTE — Progress Notes (Signed)
[facility] PHYSICAL MEDICINE & REHABILITATION PROGRESS NOTE  Subjective/Complaints: Patient seen standing up at the sink by herself.  She states she slept well overnight. Reminded patient regarding safety awareness.   ROS: + Visual deficits, unchanged.  Denies chest pain, shortness of breath, nausea, vomiting, diarrhea.  Objective: Vital Signs: Blood pressure 123/68, pulse 73, temperature 97.6 F (36.4 C), temperature source Oral, resp. rate 18, height 5\' 1"  (1.549 m), weight 60.4 kg, SpO2 90 %. No results found. No results for input(s): WBC, HGB, HCT, PLT in the last 72 hours. No results for input(s): NA, K, CL, CO2, GLUCOSE, BUN, CREATININE, CALCIUM in the last 72 hours.  Physical Exam: BP 123/68 (BP Location: Left Arm)   Pulse 73   Temp 97.6 F (36.4 C) (Oral)   Resp 18   Ht 5\' 1"  (1.549 m)   Wt 60.4 kg   SpO2 90%   BMI 25.16 kg/m  Constitutional: NAD. Vital signs reviewed. HENT: Normocephalic. Atraumatic. Eyes: No discharge.  Cannot move eyes past midline to the right visual fields, stable. Cardiovascular: No JVD. Respiratory: Normal effort. GI: Non-distended. Musc: No edema or tenderness in extremities. Neurological: Alert and oriented  Makes good eye contact with examiner and follows commands.  Good awareness of deficits Right facial weakness, unchanged Unable to close right eye Motor: RUE/RLE 5/5 prox to distal.  LUE/LLE 5/5, stable.   Skin: Skin iswarmand dry.  Psychiatric: She has anormal mood and affect. Herbehavior is normal.Judgmentand thought contentnormal.   Assessment/Plan: 1. Functional deficits secondary to right paramedial dorsal pons punctate infarct which require 3+ hours per day of interdisciplinary therapy in a comprehensive inpatient rehab setting.  Physiatrist is providing close team supervision and 24 hour management of active medical problems listed below.  Physiatrist and rehab team continue to assess barriers to discharge/monitor  patient progress toward functional and medical goals  Care Tool:  Bathing    Body parts bathed by patient: Left arm, Right arm, Chest, Abdomen, Front perineal area, Buttocks, Right upper leg, Left upper leg, Right lower leg, Left lower leg, Face   Body parts bathed by helper: Right lower leg, Left lower leg, Buttocks     Bathing assist Assist Level: Supervision/Verbal cueing     Upper Body Dressing/Undressing Upper body dressing Upper body dressing/undressing activity did not occur (including orthotics): N/A What is the patient wearing?: Pull over shirt, Bra    Upper body assist Assist Level: Supervision/Verbal cueing    Lower Body Dressing/Undressing Lower body dressing    Lower body dressing activity did not occur: N/A What is the patient wearing?: Underwear/pull up, Pants     Lower body assist Assist for lower body dressing: Supervision/Verbal cueing     Toileting Toileting    Toileting assist Assist for toileting: Contact Guard/Touching assist     Transfers Chair/bed transfer  Transfers assist     Chair/bed transfer assist level: Contact Guard/Touching assist     Locomotion Ambulation   Ambulation assist      Assist level: Contact Guard/Touching assist Assistive device: Walker-rolling Max distance: 20   Walk 10 feet activity   Assist     Assist level: Contact Guard/Touching assist Assistive device: Walker-rolling   Walk 50 feet activity   Assist    Assist level: Supervision/Verbal cueing Assistive device: Walker-rolling    Walk 150 feet activity   Assist Walk 150 feet activity did not occur: Safety/medical concerns  Assist level: Supervision/Verbal cueing Assistive device: Walker-rolling    Walk 10 feet on uneven surface  activity   Assist Walk 10 feet on uneven surfaces activity did not occur: Safety/medical concerns         Wheelchair     Assist Will patient use wheelchair at discharge?: No   Wheelchair  activity did not occur: N/A         Wheelchair 50 feet with 2 turns activity    Assist    Wheelchair 50 feet with 2 turns activity did not occur: N/A       Wheelchair 150 feet activity     Assist Wheelchair 150 feet activity did not occur: N/A          Medical Problem List and Plan: 1.Right side weakness with facial droopsecondary to acute punctate infarct right paramedial dorsal level of pons as well 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 Antithrombotics: -DVT/anticoagulation:Eliquis -antiplatelet therapy: N/A 3. Pain Management:Lyrica 150 mg daily, Cymbalta 30 mg twice daily 4. Mood:Provide emotional support -antipsychotic agents: N/A 5. Neuropsych: This patientiscapable of making decisions on hisown behalf. 6. Skin/Wound Care:Routine skin checks 7. Fluids/Electrolytes/Nutrition:Routine in and outs  8.PAF. Monitor with increased activity during therapy  Rate controlled on 6/9  Monitor with increased mobility 9.Diabetes mellitus. Hemoglobin A1c 9.6.   NovoLog 3 units 3 times daily, increased to 7 3 times daily on 6/8  Levemir increased to 20 twice daily on 6/4, increased to 25 BID on 6/9.   Check blood sugars before meals and at bedtime.   Diabetic teaching by team  Monitor with increased mobility 10. COPD/asthma. Continue nebulizers as directed 11. Hypothyroidism. Synthroid 12. GERD. Protonix 13.  AKI versus CKD  Creatinine 1.08 on 6/3  Encourage fluids  Continue to monitor 14.  Hypertension:   Norvasc 2.5 started on 6/4, increased to 7.5 on 6/8  Labile, but ?improving on 6/9  Monitor with increased mobility   LOS: 7 days A FACE TO FACE EVALUATION WAS PERFORMED  Elic Vencill Karis Jubanil Buena Boehm 10/15/2018, 8:47 AM

## 2018-10-15 NOTE — Progress Notes (Signed)
Physical Therapy Session Note  Patient Details  Name: Casey Harrell MRN: [id number] Date of Birth: 30-Apr-1935  Today's Date: 10/15/2018 PT Individual Time: 1015-1130 PT Individual Time Calculation (min): 75 min   Short Term Goals: Week 1:  PT Short Term Goal 1 (Week 1): Pt will ambulate 26' w/ supervision in household environment  PT Short Term Goal 2 (Week 1): Pt will performed bed<>chair transfer w/ supervision PT Short Term Goal 3 (Week 1): Pt will maintain dynamic standing balance w/ supervision   Skilled Therapeutic Interventions/Progress Updates:   Pt in w/c and agreeable to therapy, no c/o pain. Ambulated to/form therapy gym w/ supervision using RW, 150' each way. Worked on RLE NMR and global LE strengthening this session. Emphasis on strengthening of knee and hip musculature. Supine exercises including bridge w/ towel roll to facilitate increased adductor control, 3x10, and isometric R abduction w/ orange theraband, 2x10. Attempted to work on hip and glut musculature in tall-kneeling, however pt unable to tolerate 2/2 pressure on knees. R/L sidelying exercises including abduction raises 2x10 and clamshells 2x10. Standing exercises including partial knee bends, knee marches, knee flexion curls. 3x10 for each standing exercise. Brief intermittent rest breaks 2/2 fatigue w/ exercises. Ambulated to/from day room w/o AD, CGA-min assist, x150'. NuStep 5 min and 3 min @ level 2 for LE strengthening and endurance training, tactile and verbal cues for neutral LE alignment. Ambulated back to gym w/o AD, and then back to room w/ RW and supervision. Ended session in supine, all needs in reach.   Therapy Documentation Precautions:  Precautions Precautions: Fall Restrictions Weight Bearing Restrictions: No  Therapy/Group: Individual Therapy  Yobany Vroom Clent Demark 10/15/2018, 12:28 PM

## 2018-10-15 NOTE — Progress Notes (Signed)
Occupational Therapy Session Note  Patient Details  Name: Casey Harrell MRN: [id number] Date of Birth: 01/24/1935  Today's Date: 10/15/2018 OT Individual Time: 4327-6147 OT Individual Time Calculation (min): 26 min    Short Term Goals: Week 2:  OT Short Term Goal 1 (Week 2): LTG=STG 2/2 ELOS  Skilled Therapeutic Interventions/Progress Updates:    Pt greeted semi-reclined in bed and agreeable to OT treatment session. Pt came to sitting EOB and donned shoes with supervision and elastic shoe laces. Pt ambulated to therapy gym w/ RW and CGA with verbal cues for body proximity to RW. UB there-ex 3 sets of 10, straight arm raises, seated back row, and bicep curl. Pt needed extended rest breaks in between sets. Graded peg board activity focused on R hand in-hand manipulation and palmer translation of pegs.  Pt ambulated back to room and left semi-reclined in bed with bed alarm on and needs met.   Therapy Documentation Precautions:  Precautions Precautions: Fall Restrictions Weight Bearing Restrictions: No Pain:   denies pain  Therapy/Group: Individual Therapy  Valma Cava 10/15/2018, 3:13 PM

## 2018-10-15 NOTE — Progress Notes (Signed)
Occupational Therapy Weekly Progress Note  Patient Details  Name: Casey Harrell MRN: [id number] Date of Birth: [birth year]  Beginning of progress report period: October 09, 2018 End of progress report period: October 15, 2018  Today's Date: 10/15/2018  Session 1 OT Individual Time: 7915-0413 OT Individual Time Calculation (min): 69 min   Patient has met 3 of 3 short term goals.  Pt is making steady progress towards OT goals at this time. Pt has progressed to an overall supervision/CGA level for basic ADL tasks. Pt still needs cues for safety awareness at times and CGA for balance intermittently. Continue current POC.  Patient continues to demonstrate the following deficits: muscle weakness, decreased cardiorespiratoy endurance and decreased standing balance, decreased postural control and decreased balance strategies, vision strategies, and therefore will continue to benefit from skilled OT intervention to enhance overall performance with BADL.  Patient progressing toward long term goals..  Continue plan of care.  OT Short Term Goals Week 1:  OT Short Term Goal 1 (Week 1): patient will perform all adl tasks at a S level OT Short Term Goal 1 - Progress (Week 1): Met OT Short Term Goal 2 (Week 1): patient will perform functional transfers with CS using approp DME/AD OT Short Term Goal 2 - Progress (Week 1): Met OT Short Term Goal 3 (Week 1): patient will carryover visual motor exercises with min cues OT Short Term Goal 3 - Progress (Week 1): Met Week 2:  OT Short Term Goal 1 (Week 2): LTG=STG 2/2 ELOS  Skilled Therapeutic Interventions/Progress Updates:    Pt greeted seated EOB after finishing lunch and agreeable to OT treatment session. Pt ambulated to therapy apartment with RW and CGA. Practiced walk-in shower transfer in simulated home environment with shower bench. Pt needed CGA for balance when stepping over small ledge. Educated on use of RW bag to transport items within BADl and iADL tasks. Pt  then ambulated to dayroom in similar fashion. OT provided pt with hand exercise handouts for thera-putty and fine motor home program. Completed R hand there-ex focused on grip/pinch and coordination. Provided pt with cards and practiced R hand fine motor coordination to deal and shuffle cards while playing pt preferred game of Rummy. Pt ambulated back to room at end of session and left semi-reclined in bed with bed alarm on and needs met.   Therapy Documentation Precautions:  Precautions Precautions: Fall Restrictions Weight Bearing Restrictions: No  Pain:  denies pain  Therapy/Group: Individual Therapy  Valma Cava 10/15/2018, 9:52 AM

## 2018-10-15 NOTE — Progress Notes (Signed)
Physical Therapy Weekly Progress Note  Patient Details  Name: Casey Harrell MRN: [id number] Date of Birth: 18-Oct-1934  Beginning of progress report period: October 09, 2018 End of progress report period: October 16, 2018  Today's Date: 10/16/2018 PT Individual Time: 0800-0859 AND 1300-1330 PT Individual Time Calculation (min): 59 min AND 30 min  Patient has met 3 of 3 short term goals. Pt is progressing well w/ therapies, performing most mobility w/ supervision. She is ambulating 150' in both controlled and household environments w/ RW and needs occasional cues for safety awareness of her environment. Pt is additionally making good progress w/ her oculomotor ROM exercises, emphasizing R lateral conjugate gaze, outside of therapy.   Patient continues to demonstrate the following deficits muscle weakness, decreased cardiorespiratoy endurance, ataxia, decreased visual motor skills and decreased standing balance, decreased postural control, hemiplegia and decreased balance strategies and therefore will continue to benefit from skilled PT intervention to increase functional independence with mobility.  Patient progressing toward long term goals..  Continue plan of care.  PT Short Term Goals Week 1:  PT Short Term Goal 1 (Week 1): Pt will ambulate 66' w/ supervision in household environment  PT Short Term Goal 1 - Progress (Week 1): Met PT Short Term Goal 2 (Week 1): Pt will performed bed<>chair transfer w/ supervision PT Short Term Goal 2 - Progress (Week 1): Met PT Short Term Goal 3 (Week 1): Pt will maintain dynamic standing balance w/ supervision  PT Short Term Goal 3 - Progress (Week 1): Met Week 2:  PT Short Term Goal 1 (Week 2): =LTGs due to ELOS  Skilled Therapeutic Interventions/Progress Updates:   Session 1:  Pt in supine and agreeable to therapy, no c/o pain. Supine>sit w/ supervision and donned shoes w/ set-up assist. Ambulated to/from toilet and performed toilet transfer and hand hygiene  at sink w/ supervision. Ambulated to/from therapy gym w/ supervision using RW. Worked on dynamic standing balance this session. Standing balance tasks w/o UE support, w/ emphasis on activating ankle and hip balance strategies in all directions. UE reaching to pin clothespins on basketball hoop, ball toss, and horse shoe toss. Started w/ normal stance, progressing to feet together. Additionally CGA while pt picked-up horseshoes from ground. CGA-close supervision for balance tasks, up to mod assist to correct 1 LOB. Ambulated 125' x2 w/o AD in minimally distracting environment, CGA-close supervision w/ no overt LOB. Discussed how distractions including sounds or other people can affect her balance. Returned to room and ended session in supine, all needs in reach.   Session 2:  Pt in w/c and agreeable to therapy, no c/o pain. Ambulated to/from therapy gym w/ supervision using RW. Worked on balance this session w/ emphasis on R single leg balance and ankle balance strategies. Alternating 4" step taps w/ min assist, tactile and verbal cues for adequate R lateral weight shifting. Min assist for balance. Sit<>stands on foam surface 2x5 reps w/ 10-20 sec hold in static stance each rep. CGA-min assist to stabilize in stance, CGA once in stance for static balance. Returned to room and ambulated to/from toilet w/ supervision. Ended session in supine, all needs in reach.   Therapy Documentation Precautions:  Precautions Precautions: Fall Restrictions Weight Bearing Restrictions: No  Therapy/Group: Individual Therapy  Eann Cleland K Elvin Banker 10/16/2018, 8:59 AM

## 2018-10-16 ENCOUNTER — Inpatient Hospital Stay (HOSPITAL_COMMUNITY): Payer: Medicare Other

## 2018-10-16 ENCOUNTER — Inpatient Hospital Stay (HOSPITAL_COMMUNITY): Payer: Medicare Other | Admitting: Physical Therapy

## 2018-10-16 LAB — GLUCOSE, CAPILLARY
Glucose-Capillary: 201 mg/dL — ABNORMAL HIGH (ref 70–99)
Glucose-Capillary: 238 mg/dL — ABNORMAL HIGH (ref 70–99)
Glucose-Capillary: 284 mg/dL — ABNORMAL HIGH (ref 70–99)
Glucose-Capillary: 203 mg/dL — ABNORMAL HIGH (ref 70–99)

## 2018-10-16 MED ORDER — MUSCLE RUB 10-15 % EX CREA
TOPICAL_CREAM | CUTANEOUS | Status: DC | PRN
  Administered 2018-10-19 – 2018-10-21 (×4): via TOPICAL
  Filled 2018-10-16: qty 85

## 2018-10-16 NOTE — Patient Care Conference (Signed)
Inpatient RehabilitationTeam Conference and Plan of Care Update Date: 10/16/2018   Time: 11:35 AM    Patient Name: Casey Harrell      Medical Record Number: [id number]  Date of Birth: 01-12-1935 Sex: Female         Room/Bed: 4M11C/4M11C-01 Payor Info: Payor: [employer] MEDICARE / Plan: [employer] MEDICARE / Product Type: *No Product type* /    Admitting Diagnosis: ABI Team Rt CVA; 13-15days  Admit Date/Time:  10/08/2018  4:20 PM Admission Comments: No comment available   Primary Diagnosis:  <principal problem not specified> Principal Problem: <principal problem not specified>  Patient Active Problem List   Diagnosis Date Noted  . AKI (acute kidney injury) (HCC)   . Diabetes mellitus type 2 in nonobese (HCC)   . PAF (paroxysmal atrial fibrillation) (HCC)   . Reactive hypertension   . Right pontine CVA (HCC) 10/08/2018  . COPD (chronic obstructive pulmonary disease) (HCC) 10/03/2018  . 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 Left pontine cerebrovascular accident (HCC) 04/03/2017  . History of CVA with residual deficit   . Type 2 diabetes mellitus with peripheral neuropathy (HCC)   . Anemia 03/30/2017  . History of stroke   . Chest pain 02/09/2016  . BPPV (benign paroxysmal positional vertigo) 08/25/2015  . HLD (hyperlipidemia) 08/25/2015  . At high risk for falls 07/01/2015  . Vertigo 06/21/2015  . Paroxysmal atrial fibrillation (HCC) 06/11/2015  . Cerebral infarction due to stenosis of left middle cerebral artery (HCC) 05/20/2015  . Intracranial vascular stenosis 05/20/2015  . Type 2 diabetes mellitus with circulatory disorder (HCC) 05/20/2015  . Type 2 diabetes, uncontrolled, with neuropathy (HCC)   . Abdominal pain   . Partial small bowel obstruction (HCC)   . Benign essential HTN   . Small bowel obstruction (HCC) 05/07/2015  . Hemiplegia, unspecified affecting right dominant side (HCC) 04/08/2015  . Embolic stroke involving posterior cerebral artery (HCC)  04/05/2015  . Orthostatic hypotension   . Hypertensive urgency 04/01/2015  . Fever in adult 04/01/2015  . Acute encephalopathy 04/01/2015  . Confusion   . CVA (cerebral infarction)   . Posterior circulation stroke (HCC)   . Acute ischemic stroke (HCC) 03/31/2015  . Stroke (HCC) 03/31/2015  . CKD (chronic kidney disease) stage 3, GFR 30-59 ml/min (HCC) 04/17/2014  . Gastroenteritis, non-infectious 04/11/2014  . Nausea vomiting and diarrhea 04/10/2014  . Hypokalemia 04/10/2014  . Dehydration 04/09/2014  . Bronchitis with airway obstruction (HCC) 02/03/2013  . Leukocytosis 02/03/2013  . Fever 02/03/2013  . Acute respiratory failure with hypoxia (HCC) 02/03/2013  . Depression 02/03/2013  . Hyponatremia 02/03/2013  . Obesity (BMI 30-39.9) 01/10/2013  . PVD (peripheral vascular disease)- S/P RCE 06/18/09 12/13/2012  . Sleep apnea- C-pap intol 12/13/2012  . Low risk cardiac nuclear stress test- 2009 and Feb 2011 12/13/2012  . Mild persistent asthma 11/29/2011  . Chronic cough 06/05/2011  . Diabetic neuropathy (HCC) 09/02/2009  . Dyslipidemia 07/13/2009  . Hypothyroidism 11/12/2008  . Type 2 diabetes mellitus, uncontrolled (HCC) 07/15/2008  . Essential hypertension 07/15/2008  . GERD 07/15/2008  . DIVERTICULOSIS, COLON 07/15/2008    Expected Discharge Date: Expected Discharge Date: 10/21/18  Team Members Present: Physician leading conference: Dr. Claudette Laws Social Worker Present: Amada Jupiter, LCSW Nurse Present: Tennis Must, RN PT Present: Carlynn Purl, PT OT Present: Blanch Media, OT SLP Present: Feliberto Gottron, SLP PPS Coordinator present : Edson Snowball, PT     Current Status/Progress Goal Weekly Team Focus  Medical   pontine  infarct, improving strength on right side, continued neck pain, HTN  optimize bp control and cbg's  see above   Bowel/Bladder   Pt is continent B/B. LBM 10/14/2018.  Maintain regular bowel pattern.  Assist with toileting needs PRN.    Swallow/Nutrition/ Hydration             ADL's   Supervision/CGA overall  Mod I/supervision  balance, activity tolerance, vidion, self-care retraining   Mobility   CGA-supervision overall, gait w/ RW >150'   mod/i household gait   balance, LE strengthening, R visual/perceptual remediation, discharge planning, HEP education   Communication             Safety/Cognition/ Behavioral Observations            Pain   Pt complains of pain of 4 to right lateral aspect of neck. Pain managed with Tylenol.  Pain < 3  Assess pain Q shift and PRN.   Skin   No major skin issues.  Maintain skin integrity and prevent skin breakdown.  Assess skin Q shift and PRN.    Rehab Goals Patient on target to meet rehab goals: Yes *See Care Plan and progress notes for long and short-term goals.     Barriers to Discharge  Current Status/Progress Possible Resolutions Date Resolved   Physician    Medical stability        see medical progress notes      Nursing                  PT                    OT                  SLP                SW                Discharge Planning/Teaching Needs:  Plan for pt to d/c home with son and his family who can provide assist intermittently.  Teaching needs to be determined closer to d/c.   Team Discussion:  No significant med or nursing issues.  Cont b/b.  Supervision to CGA overall with therapies and on track for mod ind - supervision goals.  No DME needs.  Revisions to Treatment Plan:  NA    Continued Need for Acute Rehabilitation Level of Care: The patient requires daily medical management by a physician with specialized training in physical medicine and rehabilitation for the following conditions: Daily direction of a multidisciplinary physical rehabilitation program to ensure safe treatment while eliciting the highest outcome that is of practical value to the patient.: Yes Daily medical management of patient stability for increased activity during  participation in an intensive rehabilitation regime.: Yes Daily analysis of laboratory values and/or radiology reports with any subsequent need for medication adjustment of medical intervention for : Neurological problems;Diabetes problems;Blood pressure problems   I attest that I was present, lead the team conference, and concur with the assessment and plan of the team.   Amada Jupiter 10/16/2018, 4:05 PM    Team conference was held via web/ teleconference due to COVID - 19

## 2018-10-16 NOTE — Progress Notes (Signed)
Occupational Therapy Session Note  Patient Details  Name: Casey Harrell MRN: [id number] Date of Birth: 23-Jun-1934  Today's Date: 10/16/2018 OT Individual Time: 1025-8527 OT Individual Time Calculation (min): 40 min    Short Term Goals: Week 2:  OT Short Term Goal 1 (Week 2): LTG=STG 2/2 ELOS  Skilled Therapeutic Interventions/Progress Updates:    Session focused on ADL routine at shower level. Pt received in w/c with no c/o pain. Pt completed ambulatory transfer into bathroom with cueing for safety awareness/strategies, close (S) to CGA provided. Pt doffed all clothing sit <> stand in shower. From shower seat pt able to complete all bathing. Pt safely had unilateral support on grab bars when standing and was cautious in movement. Pt exited shower and donned all clothing with close (S). Cueing provided for sitting down for donning bra and shirt. Pt stood at sink and completed oral hygiene with (S). Pt left sitting up with all needs met, chair alarm set.   Therapy Documentation Precautions:  Precautions Precautions: Fall Restrictions Weight Bearing Restrictions: No   Therapy/Group: Individual Therapy  Curtis Sites 10/16/2018, 7:21 AM

## 2018-10-16 NOTE — Progress Notes (Signed)
Social Work Patient ID: Casey Harrell, female   DOB: 09/14/1934, 84 y.o.   MRN: [id number]   Have reviewed team conference with pt and son, [name].  Both aware we continue to plan for d/c on 6/15 and mod ind/ supervision goals.  They are both very pleased with progress.  Pt without any concerns.  Will set up [facility] follow up.  No DME needs.  Danielle Lento, LCSW

## 2018-10-16 NOTE — Progress Notes (Signed)
Occupational Therapy Session Note  Patient Details  Name: Casey Harrell MRN: [id number] Date of Birth: 04-10-1935  Today's Date: 10/16/2018 OT Individual Time: 1500-1556 OT Individual Time Calculation (min): 56 min    Short Term Goals: Week 1:  OT Short Term Goal 1 (Week 1): patient will perform all adl tasks at a S level OT Short Term Goal 1 - Progress (Week 1): Met OT Short Term Goal 2 (Week 1): patient will perform functional transfers with CS using approp DME/AD OT Short Term Goal 2 - Progress (Week 1): Met OT Short Term Goal 3 (Week 1): patient will carryover visual motor exercises with min cues OT Short Term Goal 3 - Progress (Week 1): Met  Skilled Therapeutic Interventions/Progress Updates:    1;1. Pt received in bed asleep. Pt with no report of pain. Pt completes toileting with supervision at ambulatory level. Pt completes ambulation to/from tx spaces with intermittent CGA and VC for keeping feet inside RW. Pt completes standing balance activities on standard tile, compliant surface and foam wedge for static and dynamic balance using pegs and clothes pins for [place] of RUE with up to MOD A for posterior LOB on foam wedge. Pt completes 2x30 ball toss for BUE coordination required for BADls (chest, bounce and overhead pass. Exited session with pt seated in bed, exit alarm on and call lightin reach.  Therapy Documentation Precautions:  Precautions Precautions: Fall Restrictions Weight Bearing Restrictions: No General:   Vital Signs: Therapy Vitals Temp: 98.5 F (36.9 C) Temp Source: Oral Pulse Rate: 61 Resp: 16 BP: (!) 143/65 Patient Position (if appropriate): Lying Oxygen Therapy SpO2: 100 % O2 Device: Room Air Pain:   ADL: ADL Eating: Supervision/safety Where Assessed-Eating: Wheelchair Grooming: Contact guard Where Assessed-Grooming: Standing at sink Upper Body Bathing: Supervision/safety Where Assessed-Upper Body Bathing: Shower Lower Body Bathing: Minimal  assistance Where Assessed-Lower Body Bathing: Shower Upper Body Dressing: Supervision/safety Where Assessed-Upper Body Dressing: Wheelchair Lower Body Dressing: Minimal assistance Where Assessed-Lower Body Dressing: Wheelchair Toileting: Contact guard Where Assessed-Toileting: Glass blower/designer: Therapist, music Method: Counselling psychologist: Energy manager: Curator Method: Heritage manager: Civil engineer, contracting with back Glass blower/designer   Exercises:   Other Treatments:     Therapy/Group: Individual Therapy  Tonny Branch 10/16/2018, 3:39 PM

## 2018-10-16 NOTE — Progress Notes (Signed)
[place] PHYSICAL MEDICINE & REHABILITATION PROGRESS NOTE  Subjective/Complaints: C/o ongoing right neck pain (since admit)  ROS: Patient denies fever, rash, sore throat, blurred vision, nausea, vomiting, diarrhea, cough, shortness of breath or chest pain, joint or back pain, headache, or mood change.   Objective: Vital Signs: Blood pressure (!) 148/85, pulse 82, temperature 98.5 F (36.9 C), resp. rate 17, height 5\' 1"  (1.549 m), weight 60.4 kg, SpO2 93 %. No results found. No results for input(s): WBC, HGB, HCT, PLT in the last 72 hours. No results for input(s): NA, K, CL, CO2, GLUCOSE, BUN, CREATININE, CALCIUM in the last 72 hours.  Physical Exam: BP (!) 148/85 (BP Location: Right Arm)   Pulse 82   Temp 98.5 F (36.9 C)   Resp 17   Ht 5\' 1"  (1.549 m)   Wt 60.4 kg   SpO2 93%   BMI 25.16 kg/m  Constitutional: No distress . Vital signs reviewed. HEENT: EOMI, oral membranes moist Neck: supple Cardiovascular: RRR without murmur. No JVD    Respiratory: CTA Bilaterally without wheezes or rales. Normal effort    GI: BS +, non-tender, non-distended  Musc: right SCM tender to palp Neurological: Alert and oriented  Makes good eye contact with examiner and follows commands.  Good awareness of deficits Right facial weakness, unchanged Unable to close right eye, tracking deficiency to right Motor: RUE/RLE 5/5 prox to distal.  LUE/LLE 5/5, stable.   Skin: Skin iswarmand dry.  Psychiatric: pleasant.   Assessment/Plan: 1. Functional deficits secondary to right paramedial dorsal pons punctate infarct which require 3+ hours per day of interdisciplinary therapy in a comprehensive inpatient rehab setting.  Physiatrist is providing close team supervision and 24 hour management of active medical problems listed below.  Physiatrist and rehab team continue to assess barriers to discharge/monitor patient progress toward functional and medical goals  Care Tool:  Bathing    Body  parts bathed by patient: Left arm, Right arm, Chest, Abdomen, Front perineal area, Buttocks, Right upper leg, Left upper leg, Right lower leg, Left lower leg, Face   Body parts bathed by helper: Right lower leg, Left lower leg, Buttocks     Bathing assist Assist Level: Supervision/Verbal cueing     Upper Body Dressing/Undressing Upper body dressing Upper body dressing/undressing activity did not occur (including orthotics): N/A What is the patient wearing?: Pull over shirt, Bra    Upper body assist Assist Level: Supervision/Verbal cueing    Lower Body Dressing/Undressing Lower body dressing    Lower body dressing activity did not occur: N/A What is the patient wearing?: Underwear/pull up, Pants     Lower body assist Assist for lower body dressing: Supervision/Verbal cueing     Toileting Toileting    Toileting assist Assist for toileting: Contact Guard/Touching assist     Transfers Chair/bed transfer  Transfers assist     Chair/bed transfer assist level: Supervision/Verbal cueing     Locomotion Ambulation   Ambulation assist      Assist level: Supervision/Verbal cueing Assistive device: Walker-rolling Max distance: 150'   Walk 10 feet activity   Assist     Assist level: Supervision/Verbal cueing Assistive device: Walker-rolling   Walk 50 feet activity   Assist    Assist level: Supervision/Verbal cueing Assistive device: Walker-rolling    Walk 150 feet activity   Assist Walk 150 feet activity did not occur: Safety/medical concerns  Assist level: Supervision/Verbal cueing Assistive device: Walker-rolling    Walk 10 feet on uneven surface  activity  Assist Walk 10 feet on uneven surfaces activity did not occur: Safety/medical concerns         Wheelchair     Assist Will patient use wheelchair at discharge?: No   Wheelchair activity did not occur: N/A         Wheelchair 50 feet with 2 turns activity    Assist     Wheelchair 50 feet with 2 turns activity did not occur: N/A       Wheelchair 150 feet activity     Assist Wheelchair 150 feet activity did not occur: N/A          Medical Problem List and Plan: 1.Right side weakness with facial droopsecondary to acute punctate infarct right paramedial dorsal level of pons as well 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 Antithrombotics: -DVT/anticoagulation:Eliquis -antiplatelet therapy: N/A 3. Pain Management:Lyrica 150 mg daily, Cymbalta 30 mg twice daily  -heat, muscle rub to right SCM, ROM with therapies 4. Mood:Provide emotional support -antipsychotic agents: N/A 5. Neuropsych: This patientiscapable of making decisions on hisown behalf. 6. Skin/Wound Care:Routine skin checks 7. Fluids/Electrolytes/Nutrition:Routine in and outs  8.PAF. Monitor with increased activity during therapy  Rate controlled on 6/10  Monitor with increased mobility 9.Diabetes mellitus. Hemoglobin A1c 9.6.   NovoLog 3 units 3 times daily, increased to 7 3 times daily on 6/8  Levemir increased to 20 twice daily on 6/4, increased to 25 BID on 6/9.   Sugars remain poorly controlled  -observe today with changes just made 10. COPD/asthma. Continue nebulizers as directed 11. Hypothyroidism. Synthroid 12. GERD. Protonix 13.  AKI versus CKD  Creatinine 1.08 on 6/3  Encourage fluids  Continue to monitor 14.  Hypertension:   Norvasc 2.5 started on 6/4, increased to 7.5 on 6/8  Borderline to improved---no changes today   LOS: 8 days A FACE TO FACE EVALUATION WAS PERFORMED  Meredith Staggers 10/16/2018, 8:49 AM

## 2018-10-17 ENCOUNTER — Inpatient Hospital Stay (HOSPITAL_COMMUNITY): Payer: Medicare Other | Admitting: Physical Therapy

## 2018-10-17 ENCOUNTER — Inpatient Hospital Stay (HOSPITAL_COMMUNITY): Payer: Medicare Other | Admitting: Occupational Therapy

## 2018-10-17 LAB — GLUCOSE, CAPILLARY
Glucose-Capillary: 167 mg/dL — ABNORMAL HIGH (ref 70–99)
Glucose-Capillary: 216 mg/dL — ABNORMAL HIGH (ref 70–99)
Glucose-Capillary: 191 mg/dL — ABNORMAL HIGH (ref 70–99)
Glucose-Capillary: 212 mg/dL — ABNORMAL HIGH (ref 70–99)

## 2018-10-17 MED ORDER — INSULIN DETEMIR 100 UNIT/ML ~~LOC~~ SOLN
28.0000 [IU] | Freq: Two times a day (BID) | SUBCUTANEOUS | Status: DC
  Administered 2018-10-17 – 2018-10-21 (×8): 28 [IU] via SUBCUTANEOUS
  Filled 2018-10-17 (×9): qty 0.28

## 2018-10-17 MED ORDER — SENNA 8.6 MG PO TABS
1.0000 | ORAL_TABLET | Freq: Two times a day (BID) | ORAL | Status: DC
  Administered 2018-10-17 – 2018-10-21 (×9): 8.6 mg via ORAL
  Filled 2018-10-17 (×9): qty 1

## 2018-10-17 NOTE — Progress Notes (Addendum)
[place] PHYSICAL MEDICINE & REHABILITATION PROGRESS NOTE  Subjective/Complaints: Feeling fairly well. Neck a bit looser. Felt that cream helped neck  ROS: Patient denies fever, rash, sore throat, blurred vision, nausea, vomiting, diarrhea, cough, shortness of breath or chest pain, joint or back pain, headache, or mood change.    Objective: Vital Signs: Blood pressure (!) 153/75, pulse 71, temperature 97.9 F (36.6 C), temperature source Oral, resp. rate 18, height 5\' 1"  (1.549 m), weight 60.4 kg, SpO2 91 %. No results found. No results for input(s): WBC, HGB, HCT, PLT in the last 72 hours. No results for input(s): NA, K, CL, CO2, GLUCOSE, BUN, CREATININE, CALCIUM in the last 72 hours.  Physical Exam: BP (!) 153/75 (BP Location: Right Arm)   Pulse 71   Temp 97.9 F (36.6 C) (Oral)   Resp 18   Ht 5\' 1"  (1.549 m)   Wt 60.4 kg   SpO2 91%   BMI 25.16 kg/m  Constitutional: No distress . Vital signs reviewed. HEENT: EOMI, oral membranes moist Neck: supple Cardiovascular: RRR without murmur. No JVD    Respiratory: CTA Bilaterally without wheezes or rales. Normal effort    GI: BS +, non-tender, non-distended  Musc: right SCM tender to palp, better positioning of head/neck today Neurological: Alert and oriented  Makes good eye contact with examiner and follows commands.  Good awareness of deficits Right facial weakness, unchanged Unable to close right eye, tracking deficiency to right Motor: RUE/RLE 5/5 prox to distal.  LUE/LLE 5/5, stable.   Skin: Skin iswarmand dry.  Psychiatric: pleasant.   Assessment/Plan: 1. Functional deficits secondary to right paramedial dorsal pons punctate infarct which require 3+ hours per day of interdisciplinary therapy in a comprehensive inpatient rehab setting.  Physiatrist is providing close team supervision and 24 hour management of active medical problems listed below.  Physiatrist and rehab team continue to assess barriers to  discharge/monitor patient progress toward functional and medical goals  Care Tool:  Bathing    Body parts bathed by patient: Left arm, Right arm, Chest, Abdomen, Front perineal area, Buttocks, Right upper leg, Left upper leg, Right lower leg, Left lower leg, Face   Body parts bathed by helper: Right lower leg, Left lower leg, Buttocks     Bathing assist Assist Level: Supervision/Verbal cueing     Upper Body Dressing/Undressing Upper body dressing Upper body dressing/undressing activity did not occur (including orthotics): N/A What is the patient wearing?: Pull over shirt, Bra    Upper body assist Assist Level: Supervision/Verbal cueing    Lower Body Dressing/Undressing Lower body dressing    Lower body dressing activity did not occur: N/A What is the patient wearing?: Underwear/pull up, Pants     Lower body assist Assist for lower body dressing: Supervision/Verbal cueing     Toileting Toileting    Toileting assist Assist for toileting: Supervision/Verbal cueing     Transfers Chair/bed transfer  Transfers assist     Chair/bed transfer assist level: Supervision/Verbal cueing     Locomotion Ambulation   Ambulation assist      Assist level: Supervision/Verbal cueing Assistive device: Walker-rolling Max distance: 150'   Walk 10 feet activity   Assist     Assist level: Supervision/Verbal cueing Assistive device: Walker-rolling   Walk 50 feet activity   Assist    Assist level: Supervision/Verbal cueing Assistive device: Walker-rolling    Walk 150 feet activity   Assist Walk 150 feet activity did not occur: Safety/medical concerns  Assist level: Supervision/Verbal cueing Assistive device:  Walker-rolling    Walk 10 feet on uneven surface  activity   Assist Walk 10 feet on uneven surfaces activity did not occur: Safety/medical concerns         Wheelchair     Assist Will patient use wheelchair at discharge?: No   Wheelchair  activity did not occur: N/A         Wheelchair 50 feet with 2 turns activity    Assist    Wheelchair 50 feet with 2 turns activity did not occur: N/A       Wheelchair 150 feet activity     Assist Wheelchair 150 feet activity did not occur: N/A          Medical Problem List and Plan: 1.Right side weakness with facial droopsecondary to acute punctate infarct right paramedial dorsal level of pons as well 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 Antithrombotics: -DVT/anticoagulation:Eliquis -antiplatelet therapy: N/A 3. Pain Management:Lyrica 150 mg daily, Cymbalta 30 mg twice daily  -continue heat, muscle rub to right SCM, ROM with therapies 4. Mood:Provide emotional support -antipsychotic agents: N/A 5. Neuropsych: This patientiscapable of making decisions on hisown behalf. 6. Skin/Wound Care:Routine skin checks 7. Fluids/Electrolytes/Nutrition:Routine in and outs  8.PAF. Monitor with increased activity during therapy  Rate controlled on 6/11  Monitor with increased mobility 9.Diabetes mellitus. Hemoglobin A1c 9.6.   NovoLog 3 units 3 times daily, increased to 7 3 times daily on 6/8  Levemir increased to 20 twice daily on 6/4, increased to 25 BID on 6/9.   Increase to 28 u bid today 10. COPD/asthma. Continue nebulizers as directed 11. Hypothyroidism. Synthroid 12. GERD. Protonix 13.  AKI versus CKD  Creatinine 1.08 on 6/3  Encourage fluids  Continue to monitor 14.  Hypertension:   Norvasc 2.5 started on 6/4, increased to 7.5 on 6/8  Consider increase to 10mg    LOS: 9 days A FACE TO FACE EVALUATION WAS PERFORMED  Ranelle OysterZachary T  10/17/2018, 9:05 AM

## 2018-10-17 NOTE — Progress Notes (Signed)
Physical Therapy Session Note  Patient Details  Name: Casey Harrell MRN: [id number] Date of Birth: Feb 05, 1935  Today's Date: 10/17/2018 PT Individual Time: 1330-1440 PT Individual Time Calculation (min): 70 min   Short Term Goals: Week 2:  PT Short Term Goal 1 (Week 2): =LTGs due to ELOS  Skilled Therapeutic Interventions/Progress Updates:   Pt in w/c and agreeable to therapy, no c/o pain. Ambulated to/from therapy gym and around unit w/o AD x150-200' w/ CGA. Performed multiple bouts of gait w/ verbal cues to increased R step length and foot clearance. Worked on dual task w/ gait bouts as well including naming foods/animals and backwards months, no increase in assistance needed w/ dual task, however pt w/ slowed gait pattern and stopped to think multiple times. Educated pt on effects of dual task on safety and fall risk w/ household mobility. Worked on proprioception and limits of stability training on biodex, 24%, 24%, and 35% at beginner level. Worked on ankle balance strategies on airex in stance while performing visual scanning tasks w/ peg board, no UE support w/ CGA primarily, min assist to correct 1 LOB. Returned to room and performed toilet transfer w/ supervision. Ended session in supine, all needs in reach.  Therapy Documentation Precautions:  Precautions Precautions: Fall Restrictions Weight Bearing Restrictions: No Vital Signs: Therapy Vitals Temp: 97.7 F (36.5 C) Temp Source: Oral Pulse Rate: 62 Resp: 17 BP: (!) 143/62(notified Tax adviser) Patient Position (if appropriate): Lying Oxygen Therapy SpO2: 93 % O2 Device: Room Air  Therapy/Group: Individual Therapy  Casten Floren Clent Demark 10/17/2018, 6:03 PM

## 2018-10-17 NOTE — Progress Notes (Signed)
Occupational Therapy Session Note  Patient Details  Name: Casey Harrell MRN: [id number] Date of Birth: 12-12-1934  Today's Date: 10/17/2018  Session 1 OT Individual Time: 4098-1191 OT Individual Time Calculation (min): 38 min   Session 2 OT Individual Time: 4782-9562 OT Individual Time Calculation (min): 70 min   Short Term Goals: Week 2:  OT Short Term Goal 1 (Week 2): LTG=STG 2/2 ELOS  Skilled Therapeutic Interventions/Progress Updates:    Pt greeted sitting in wc and agreeable to OT treatment session. Pt reports she had just finished breakfast and agreeable to shower. Worked on Transport planner from Insurance claims handler with education provided for RW positioning, safe technique when reaching, and how to transport clothing on RW. Pt demonstrated improved balance at supervision level with higher level reaching task. Pt then ambulated into bathroom, onto commode. Pt voided bladder with smear of BM. Pt completed peri-care with supervision and doffed clothing siting on commode. Pt then ambulated to transfer into shower with RW and close supervision. Bathing completed sit<>stand from shower chair with verbal cues to remain sitting for most of the shower to decrease risk of falls. Pt returned to sitting EOB for dressing tasks. Increased time for fine motor task of clasping bra, but eventually able to complete herself. Pt donned pants, socks, and shoes, supervision. Pt then ambulated to the sink for grooming tasks with verbal cues not to pull on RW for sit<>stand. Pt with good recall of RW positioning at the sink. Pt completed toothbrushing and hairbrushing in standing. Pt returned to wc at end of session and left seated with chair alarm on and needs met.   Session 2 Pt greeted sidelying in bed and agreeable to OT treatment session. Pt came to sitting EOB and donned shoes with supervision. Pt needed verbal cues for hand placement with sit<>stand from RW, then ambulated into bathroom w/ Supervision. Pt  voided bladder and completed 3/3 toileting steps supervision. Supervision to stand at the sink and wash hands. Pt then ambulated to dayroom w/ RW and verbal cues for proximity to RW. Worked on balance strategies with cornhole activity without AD. Worked on step and follow through with R LE and R UE to toss bean bags. Pt with intermittent LOBs needing CGA and intermittent min A for lateral and anterior LOB.  Pt occasionally scissoring with stepping. Worked on stepping in place on R LE and L LE with CGA/min A 3 sets of 10 on each side.  B UE coordination with ball toss chest pass and overhead pass. Pt ambulated back to room and seated in wc with needs met.   Therapy Documentation Precautions:  Precautions Precautions: Fall Restrictions Weight Bearing Restrictions: No Pain: none/denies pain  Therapy/Group: Individual Therapy  Valma Cava 10/17/2018, 12:13 PM

## 2018-10-17 NOTE — Progress Notes (Signed)
Physical Therapy Discharge Summary  Patient Details  Name: Casey Harrell MRN: [id number] Date of Birth: Apr 17, 1935  Patient has met 9of 9 long term goals due to improved activity tolerance, improved balance, improved postural control, ability to compensate for deficits, functional use of  right upper extremity and right lower extremity and improved coordination.  Patient to discharge at an ambulatory level Modified Independent in household environment, supervision in community environment. Patient's care partner (son and daughter-in-law) is independent to provide the necessary physical assistance at discharge including set-up assist for meals and ADLs prior to leaving the home for work. Verbally discussed this plan with pt's son over the phone, he is in agreement.   Reasons goals not met: na Recommendation:  Patient will benefit from ongoing skilled PT services in home health setting to continue to advance safe functional mobility, address ongoing impairments in functional balance, coordination, oculomotor ROM, and endurance, and minimize fall risk.  Equipment: No equipment provided (has DME from previous admissions)   Reasons for discharge: treatment goals met and discharge from hospital  Patient/family agrees with progress made and goals achieved: Yes  PT Discharge  Treatment  Pt sound asleep in bed, but awakened to PT calling out name.  Bed mobility independent in flat bed no rails.  Sit> stand with RW modified independent.  Gait to BR for continent voiding and toileting modified independent  Gait throughout unit on level tile up to 200' with supervision; shorter distances before fatigue safely modified independent.    She reported that her son will drive her sedan to pick her up.  Simulated car transfer to [place] with superivsion.  Seated Therapeutic exercise performed with LEs to increase strength for functional mobility.: 15 x 1 each bil ankle DF, ankle PF, bil shoulder  adduction, bil LE adductor squeezes with footstool between feet to stretch hip external rotators. Using plastic tube, diagonal "chops" in D1 pattern, following tube with her eyes, x 15 to R/L.  No c/o vertigo with movements.  Pt has poor balance strategies; see balance info below.  Self- stretching R/L heel cords and hamstrings in sitting using footstool, x 30 seconds x 1.  Standing sustained stretch and balance challenge, with forefeet on wedge, bil UE support on RW, fading to 0UE support x 1 minute, without LOB.  Given external perturbations in standing, pt demonstrates variable balance strategies; intermittent bil ankle, intermittent bil hip; delayed L stepping; no strategy sufficient for pt to recover from perturbations.   At end of session, pt sitting in w/c with seat pad alarm set and needs at hand. Precautions/Restrictions Precautions Precautions: None Restrictions Weight Bearing Restrictions: No Vision/Perception  Vision - Assessment Ocular Range of Motion: Restricted on the right Alignment/Gaze Preference: Within Defined Limits Tracking/Visual Pursuits: Right eye does not track laterally;Left eye does not track medially;Decreased smoothness of eye movement to RIGHT inferior field;Decreased smoothness of eye movement to RIGHT superior field;Requires cues, head turns, or add eye shifts to track Saccades: Additional eye shifts occurred during testing Convergence: Impaired - to be further tested in functional context Perception Perception: Within Functional Limits(able to compensate for R visual field deficits w/ head turns) Praxis Praxis: Intact  Cognition Overall Cognitive Status: Within Functional Limits for tasks assessed Arousal/Alertness: Awake/alert Orientation Level: Oriented X4 Alternating Attention: Appears intact Memory: Appears intact Awareness: Appears intact Problem Solving: Appears intact Safety/Judgment: Appears intact Sensation Sensation Light Touch: Appears  Intact Proprioception: Impaired by gross assessment(BLEs impaired) Coordination Gross Motor Movements are Fluid and Coordinated: No(RLE  ataxic w/ open chain movements) Heel Shin Test: WNL Motor  Motor Motor: Ataxia;Hemiplegia Motor - Discharge Observations: mild R hemi remains, slight ataxia w/ open chain RLE movements  Mobility Bed Mobility Bed Mobility: Rolling Right;Rolling Left;Sit to Supine;Supine to Sit Rolling Right: Independent Rolling Left: Independent Supine to Sit: Independent Sit to Supine: Independent Transfers Transfers: Sit to Stand;Stand to Sit;Stand Pivot Transfers Sit to Stand: Independent with assistive device Stand to Sit: Independent with assistive device Stand Pivot Transfers: Independent with assistive device Transfer (Assistive device): Rolling walker Locomotion  Gait Gait Assistance: Supervision/Verbal cueing Gait Distance (Feet): 200 Feet(x 50' modified indep) Assistive device: Rolling walker Gait Assistance Details: pt able to ambulate short distances safely independently ; with fatigue of longer distances, supervision recommended Gait Gait: Yes Gait Pattern: Impaired(bil hips externally rotated with R foot nearly catching left heel q step) Gait Pattern: Narrow base of support;Decreased hip/knee flexion - left;Decreased dorsiflexion - right;Step-through pattern High Level Ambulation High Level Ambulation: Backwards walking Backwards Walking: close supervision, wiht RW Stairs / Additional Locomotion Stairs: Yes Stairs Assistance: Supervision/Verbal cueing Stair Management Technique: Two rails Number of Stairs: 12 Height of Stairs: 6(and 3") Wheelchair Mobility Wheelchair Mobility: No  Trunk/Postural Assessment  Cervical Assessment Cervical Assessment: Exceptions to WFL(forward head, increased stiffness globally, this is baseline) Thoracic Assessment Thoracic Assessment: Exceptions to WFL(rounded shoulders) Lumbar Assessment Lumbar Assessment:  Exceptions to WFL(posterior pelvic tilt, increased stiffness) Postural Control Postural Control: Within Functional Limits  Balance Balance Balance Assessed: Yes Static Sitting Balance Static Sitting - Balance Support: No upper extremity supported Static Sitting - Level of Assistance: 7: Independent Dynamic Sitting Balance Dynamic Sitting - Balance Support: No upper extremity supported Dynamic Sitting - Level of Assistance: 7: Independent Dynamic Sitting - Balance Activities: Forward lean/weight shifting Static Standing Balance Static Standing - Balance Support: No upper extremity supported Static Standing - Level of Assistance: 7: Independent Dynamic Standing Balance Dynamic Standing - Balance Support: (pt has inadequate bil ankle, hip and stepping strategies) Dynamic Standing - Level of Assistance: 4: Min assist Dynamic Standing - Balance Activities: Forward lean/weight shifting Dynamic Standing - Comments: when reaching forward and then returning to midline, she has LOB backwards due to inadquate balance strategies Extremity Assessment  RLE Assessment RLE Assessment: Within Functional Limits Passive Range of Motion (PROM) Comments: [facility] Strength Comments: Globally 4/5 LLE Assessment LLE Assessment: Within Functional Limits Passive Range of Motion (PROM) Comments: [facility] Strength Comments: Globally 4/5    Amy K Tally 10/21/2018, 9:00 AM

## 2018-10-18 ENCOUNTER — Inpatient Hospital Stay (HOSPITAL_COMMUNITY): Payer: Medicare Other | Admitting: Occupational Therapy

## 2018-10-18 ENCOUNTER — Inpatient Hospital Stay (HOSPITAL_COMMUNITY): Payer: Medicare Other | Admitting: Physical Therapy

## 2018-10-18 LAB — GLUCOSE, CAPILLARY
Glucose-Capillary: 157 mg/dL — ABNORMAL HIGH (ref 70–99)
Glucose-Capillary: 203 mg/dL — ABNORMAL HIGH (ref 70–99)
Glucose-Capillary: 173 mg/dL — ABNORMAL HIGH (ref 70–99)
Glucose-Capillary: 272 mg/dL — ABNORMAL HIGH (ref 70–99)

## 2018-10-18 NOTE — Progress Notes (Signed)
Occupational Therapy Session Note  Patient Details  Name: Casey Harrell MRN: [id number] Date of Birth: 1934/07/29  Today's Date: 10/18/2018 OT Individual Time: 1045-1200 OT Individual Time Calculation (min): 75 min   Short Term Goals: Week 2:  OT Short Term Goal 1 (Week 2): LTG=STG 2/2 ELOS  Skilled Therapeutic Interventions/Progress Updates:    Pt greeted semi-reclined in bed and agreeable to OT treatment session. Pt requests to shower today. Pt needed verbal cues for hand placement on RW for sit<>stand. Pt able to recall education from yesterday on RW position to access drawers to collect clothing, however, pt then tried to leave RW behind and needed verbal cues to ambulate with RW. Pt doffed clothing sitting on shower chair and bathing completed with supervision. Dressing tasks completed seated EOB w/ increased time and supervision. Dynamic standing balance and endurance with grooming tasks at the sink. Pt reported pain in neck and OT applied Voltaren gel and provided gentle massage for facial release. Reviewed home fine motor program. Worked on R fine motor coordination with handwriting tasks focused on increased smoothness and accuracy. Graded activity with larger marker to practice writing letters of name. Pt returned to room at end of session and left seated in wc with chair alarm on and needs met.   Therapy Documentation Precautions:  Precautions Precautions: None Restrictions Weight Bearing Restrictions: No Pain: Pain Assessment Pain Scale: 0-10 Pain Score: 4  Pain Type: Acute pain Pain Location: Neck Pain Orientation: Right Pain Descriptors / Indicators: Sore Pain Onset: On-going Pain Intervention(s): Massage   Therapy/Group: Individual Therapy  Valma Cava 10/18/2018, 12:32 PM

## 2018-10-18 NOTE — Progress Notes (Signed)
[place] PHYSICAL MEDICINE & REHABILITATION PROGRESS NOTE  Subjective/Complaints: Up in bed. No new issues. Right neck remains a little sore  ROS: Patient denies fever, rash, sore throat, blurred vision, nausea, vomiting, diarrhea, cough, shortness of breath or chest pain, joint or back pain, headache, or mood change.   Objective: Vital Signs: Blood pressure 132/69, pulse 75, temperature 97.7 F (36.5 C), temperature source Oral, resp. rate 18, height 5\' 1"  (1.549 m), weight 60.4 kg, SpO2 96 %. No results found. No results for input(s): WBC, HGB, HCT, PLT in the last 72 hours. No results for input(s): NA, K, CL, CO2, GLUCOSE, BUN, CREATININE, CALCIUM in the last 72 hours.  Physical Exam: BP 132/69 (BP Location: Left Arm)   Pulse 75   Temp 97.7 F (36.5 C) (Oral)   Resp 18   Ht 5\' 1"  (1.549 m)   Wt 60.4 kg   SpO2 96%   BMI 25.16 kg/m  Constitutional: No distress . Vital signs reviewed. HEENT: EOMI, oral membranes moist Neck: supple Cardiovascular: RRR without murmur. No JVD    Respiratory: CTA Bilaterally without wheezes or rales. Normal effort    GI: BS +, non-tender, non-distended  Musc: right SCM tender to palp, better positioning of head/neck today Neurological: Alert and oriented  Makes good eye contact with examiner and follows commands.  Good awareness of deficits Right facial weakness, unchanged Unable to close right eye, tracking deficiency to right Motor: RUE/RLE 5/5 prox to distal.  LUE/LLE 5/5, stable.   Skin: Skin iswarmand dry.  Psychiatric: pleasant.   Assessment/Plan: 1. Functional deficits secondary to right paramedial dorsal pons punctate infarct which require 3+ hours per day of interdisciplinary therapy in a comprehensive inpatient rehab setting.  Physiatrist is providing close team supervision and 24 hour management of active medical problems listed below.  Physiatrist and rehab team continue to assess barriers to discharge/monitor patient  progress toward functional and medical goals  Care Tool:  Bathing    Body parts bathed by patient: Left arm, Right arm, Chest, Abdomen, Front perineal area, Buttocks, Right upper leg, Left upper leg, Right lower leg, Left lower leg, Face   Body parts bathed by helper: Right lower leg, Left lower leg, Buttocks     Bathing assist Assist Level: Supervision/Verbal cueing     Upper Body Dressing/Undressing Upper body dressing Upper body dressing/undressing activity did not occur (including orthotics): N/A What is the patient wearing?: Pull over shirt, Bra    Upper body assist Assist Level: Supervision/Verbal cueing    Lower Body Dressing/Undressing Lower body dressing    Lower body dressing activity did not occur: N/A What is the patient wearing?: Underwear/pull up, Pants     Lower body assist Assist for lower body dressing: Supervision/Verbal cueing     Toileting Toileting    Toileting assist Assist for toileting: Supervision/Verbal cueing     Transfers Chair/bed transfer  Transfers assist     Chair/bed transfer assist level: Supervision/Verbal cueing     Locomotion Ambulation   Ambulation assist      Assist level: Supervision/Verbal cueing Assistive device: Walker-rolling Max distance: 150'   Walk 10 feet activity   Assist     Assist level: Supervision/Verbal cueing Assistive device: Walker-rolling   Walk 50 feet activity   Assist    Assist level: Supervision/Verbal cueing Assistive device: Walker-rolling    Walk 150 feet activity   Assist Walk 150 feet activity did not occur: Safety/medical concerns  Assist level: Supervision/Verbal cueing Assistive device: Walker-rolling  Walk 10 feet on uneven surface  activity   Assist Walk 10 feet on uneven surfaces activity did not occur: Safety/medical concerns   Assist level: Supervision/Verbal cueing Assistive device: PhotographerWalker-rolling   Wheelchair     Assist Will patient use  wheelchair at discharge?: No   Wheelchair activity did not occur: N/A         Wheelchair 50 feet with 2 turns activity    Assist    Wheelchair 50 feet with 2 turns activity did not occur: N/A       Wheelchair 150 feet activity     Assist Wheelchair 150 feet activity did not occur: N/A          Medical Problem List and Plan: 1.Right side weakness with facial droopsecondary to acute punctate infarct right paramedial dorsal level of pons as well old cortical infarcts in the occipital lobes more extensive on the left than on the right  Continue CIR PT, OT 2. Antithrombotics: -DVT/anticoagulation:Eliquis -antiplatelet therapy: N/A 3. Pain Management:Lyrica 150 mg daily, Cymbalta 30 mg twice daily  -continue heat, muscle rub to right SCM, ROM with therapies. Reviewed some basic stretches with patient today 4. Mood:Provide emotional support -antipsychotic agents: N/A 5. Neuropsych: This patientiscapable of making decisions on hisown behalf. 6. Skin/Wound Care:Routine skin checks 7. Fluids/Electrolytes/Nutrition:Routine in and outs  8.PAF. Monitor with increased activity during therapy  Rate controlled on 6/12  Monitor with increased mobility 9.Diabetes mellitus. Hemoglobin A1c 9.6.   NovoLog 3 units 3 times daily, increased to 7 3 times daily on 6/8  Levemir increased to 28 u bid 6/11---observe for patterns today 10. COPD/asthma. Continue nebulizers as directed 11. Hypothyroidism. Synthroid 12. GERD. Protonix 13.  AKI versus CKD  Creatinine 1.08 on 6/3  Encourage fluids  Continue to monitor 14.  Hypertension:   Norvasc 2.5 started on 6/4, increased to 7.5 on 6/8  BP is controlled at present   LOS: 10 days A FACE TO FACE EVALUATION WAS PERFORMED  Casey Harrell 10/18/2018, 10:23 AM

## 2018-10-18 NOTE — Progress Notes (Signed)
Physical Therapy Session Note  Patient Details  Name: Casey Harrell MRN: [id number] Date of Birth: Aug 29, 1934  Today's Date: 10/18/2018 PT Individual Time: 0800-0857 AND 1430-1525 PT Individual Time Calculation (min): 57 min AND 55 min  Short Term Goals: Week 2:  PT Short Term Goal 1 (Week 2): =LTGs due to ELOS  Skilled Therapeutic Interventions/Progress Updates:   Session 1:  Pt in supine and agreeable to therapy, no c/o pain. Supine>sit w/ supervision and changed into shirt and shorts w/ set-up assist. Ambulated to/from toilet and performed toilet transfer w/ supervision. Supervision for self-care tasks while standing at sink as well. Ambulated around unit w/ supervision using RW, >150' at a time. Session focused on functional balance in household environment, discharge planning, and fall risk education. Negotiated ADL apartment and kitchen w/ RW and discussed importance of RW use at all times in the home and to utilize walker bag to carry things, even her meals and items from fridge. Practiced retrieving weighted items from cabinet and fridge and placing into walker bag or carrying a short distance using RW. Practiced furniture transfers on couch and ADL apartment bed, and practiced car transfer and negotiating steps to enter home. Performed all tasks safely and w/ supervision Returned to room and ended session in w/c, all needs in reach.   Session 2:  Pt in supine and agreeable to therapy, no c/o pain. Supine>sit w/ supervision and ambulated to/from therapy gym w/ supervision using RW. Reviewed HEP including ocular ROM exercises to work on R lateral gaze ROM; as well as LE and core strengthening including supine bridges, mini squats and knee marches w/ RW support, and trunk rotation in hooklying. Practiced floor transfer to/from floor x2 reps w/ supervision. No verbal/visual cues needed for 2nd rep. Provided pt w/ written handout of floor transfer technique for her family to review with her as  well. Discussed home set-up to maximize fall safety including well-lit pathways and decreased clutter. Provided written handout of this as well for family members. Returned to room and ended session sitting EOB independently, all needs in reach.   Therapy Documentation Precautions:  Precautions Precautions: None Restrictions Weight Bearing Restrictions: No Vital Signs: Therapy Vitals Temp: 97.7 F (36.5 C) Temp Source: Oral Pulse Rate: 75 Resp: 18 BP: 132/69 Patient Position (if appropriate): Lying Oxygen Therapy SpO2: 96 % O2 Device: Room Air Pain: Pain Assessment Pain Scale: 0-10 Pain Score: 6  Pain Location: Neck Pain Orientation: Right Pain Intervention(s): Medication (See eMAR)  Therapy/Group: Individual Therapy  Javaughn Opdahl Clent Demark 10/18/2018, 8:58 AM

## 2018-10-19 ENCOUNTER — Inpatient Hospital Stay (HOSPITAL_COMMUNITY): Payer: Medicare Other | Admitting: Physical Therapy

## 2018-10-19 LAB — GLUCOSE, CAPILLARY
Glucose-Capillary: 151 mg/dL — ABNORMAL HIGH (ref 70–99)
Glucose-Capillary: 191 mg/dL — ABNORMAL HIGH (ref 70–99)
Glucose-Capillary: 246 mg/dL — ABNORMAL HIGH (ref 70–99)
Glucose-Capillary: 259 mg/dL — ABNORMAL HIGH (ref 70–99)

## 2018-10-19 NOTE — Progress Notes (Signed)
Occupational Therapy Discharge Summary  Patient Details  Name: Casey Harrell MRN: [id number] Date of Birth: 1935-01-31  Today's Date: 10/20/2018 OT Individual Time: 7425-9563 OT Individual Time Calculation (min): 60 min    Patient has met 14 of 14 long term goals due to improved activity tolerance, improved balance, postural control, ability to compensate for deficits, functional use of  RIGHT upper extremity, improved attention, improved awareness and improved coordination.  Patient to discharge at overall Modified Independent level.  Patient's care partner is independent to provide the necessary physical assistance at discharge.  Family has been educated and is aware that pt still requires supervision for shower transfers.   Reasons goals not met: n/a  Recommendation:  Patient will benefit from ongoing skilled OT services in home health setting to continue to advance functional skills in the area of BADL.  Equipment: No equipment provided  Reasons for discharge: treatment goals met and discharge from hospital  Patient/family agrees with progress made and goals achieved: Yes   Skilled OT Intervention: Pt received sitting EOB with nursing staff present administering medication. Pt completed ambulatory transfer into bathroom with mod I. (S) required during transfer into walk in shower, with pt demonstrating good safety awareness. Pt able to doff all clothing sit <> stand from shower chair. Pt mod I for all bathing from shower chair level with appropriate use of grab bars and taking rest breaks as needed. Pt dressed EOB with mod I. Discussed d/c planning and need for (S) during shower transfers- pt agreeable. Pt completed grooming and oral hygiene tasks in standing at sink with mod I. Discussed energy conservation straetgies throughout session. Pt completed 9 hole peg test again, with results below. Pt left sitting up in w/c with all needs within reach.   OT Discharge Precautions/Restrictions   Precautions Precautions: Other (comment) Precaution Comments: Visual impairment Restrictions Weight Bearing Restrictions: No  Pain Pain Assessment Pain Scale: 0-10 Pain Score: 0-No pain ADL ADL Eating: Modified independent Where Assessed-Eating: Edge of bed Grooming: Modified independent Where Assessed-Grooming: Standing at sink Upper Body Bathing: Modified independent Where Assessed-Upper Body Bathing: Shower, Chair Lower Body Bathing: Modified independent Where Assessed-Lower Body Bathing: Shower, Chair Upper Body Dressing: Modified independent (Device) Where Assessed-Upper Body Dressing: Edge of bed Lower Body Dressing: Modified independent Where Assessed-Lower Body Dressing: Edge of bed Toileting: Modified independent Where Assessed-Toileting: Glass blower/designer: Distant supervision Armed forces technical officer Method: Counselling psychologist: Energy manager: Distant supervision Social research officer, government Method: Heritage manager: Civil engineer, contracting with back Vision Baseline Vision/History: No visual deficits Patient Visual Report: Blurring of vision Vision Assessment?: Yes Eye Alignment: Impaired (comment) Ocular Range of Motion: Restricted on the right Alignment/Gaze Preference: Within Defined Limits Tracking/Visual Pursuits: Right eye does not track laterally;Left eye does not track medially;Decreased smoothness of eye movement to RIGHT inferior field;Decreased smoothness of eye movement to RIGHT superior field;Requires cues, head turns, or add eye shifts to track Saccades: Additional eye shifts occurred during testing Convergence: Impaired - to be further tested in functional context Visual Fields: No apparent deficits Perception  Perception: Within Functional Limits Praxis Praxis: Intact Cognition Overall Cognitive Status: Within Functional Limits for tasks assessed Arousal/Alertness: Awake/alert Orientation Level: Oriented  X4 Attention: Alternating Alternating Attention: Appears intact Memory: Appears intact Awareness: Appears intact Problem Solving: Appears intact Safety/Judgment: Appears intact Comments: pt is aware of safe activities that she should and shouldn't perform while family is gone during day Sensation Sensation Light Touch: Appears Intact Hot/Cold: Appears Intact Coordination  Gross Motor Movements are Fluid and Coordinated: No Fine Motor Movements are Fluid and Coordinated: No Coordination and Movement Description: Previous CVA, decreased coordination Finger Nose Finger Test: mild dysmetria bilateral (hx of CVA with residual deficits Left side) 9 Hole Peg Test: R: 48 sec, L 31 sec Motor  Motor Motor: Ataxia;Hemiplegia Motor - Discharge Observations: mild R hemi remains, slight ataxia w/ open chain RLE movements Mobility  Bed Mobility Bed Mobility: Rolling Right;Rolling Left;Sit to Supine;Supine to Sit Rolling Right: Independent Rolling Left: Independent Supine to Sit: Independent Sit to Supine: Independent Transfers Sit to Stand: Independent with assistive device Stand to Sit: Independent with assistive device  Trunk/Postural Assessment  Cervical Assessment Cervical Assessment: Exceptions to WFL(forward head) Thoracic Assessment Thoracic Assessment: Exceptions to WFL(kyphotic) Lumbar Assessment Lumbar Assessment: (posterior pelvic tilt) Postural Control Postural Control: Within Functional Limits  Balance Balance Balance Assessed: Yes Static Sitting Balance Static Sitting - Balance Support: No upper extremity supported;Feet supported Static Sitting - Level of Assistance: 6: Modified independent (Device/Increase time) Dynamic Sitting Balance Dynamic Sitting - Balance Support: During functional activity Dynamic Sitting - Level of Assistance: 6: Modified independent (Device/Increase time) Static Standing Balance Static Standing - Balance Support: During functional  activity Static Standing - Level of Assistance: 6: Modified independent (Device/Increase time) Dynamic Standing Balance Dynamic Standing - Balance Support: During functional activity Dynamic Standing - Level of Assistance: 5: Stand by assistance Extremity/Trunk Assessment RUE Assessment RUE Assessment: Within Functional Limits Active Range of Motion (AROM) Comments: WFL- 90 degrees of shoulder flex, pt's normal General Strength Comments: 4/5 LUE Assessment LUE Assessment: Within Functional Limits Active Range of Motion (AROM) Comments: WFL- 90 degrees shoulder flex General Strength Comments: 4/5   Curtis Sites 10/20/2018, 8:32 AM

## 2018-10-19 NOTE — Plan of Care (Signed)
  Problem: RH Vision Goal: RH LTG Vision (Specify) Outcome: Completed/Met   Problem: RH Simple Meal Prep Goal: LTG Patient will perform simple meal prep w/assist (OT) Description: LTG: Patient will perform simple meal prep with assistance, with/without cues (OT). Outcome: Completed/Met

## 2018-10-19 NOTE — Progress Notes (Signed)
Casey Harrell is a 84 y.o. female [birth year] [id number]  Subjective: No new complaints. No new problems. Slept well. Feeling OK.  Objective: Vital signs in last 24 hours: Temp:  [98 F (36.7 C)-98.2 F (36.8 C)] 98.1 F (36.7 C) (06/13 0555) Pulse Rate:  [62-70] 62 (06/13 0555) Resp:  [16-19] 16 (06/13 0555) BP: (127-170)/(50-99) 139/50 (06/13 0803) SpO2:  [90 %-97 %] 90 % (06/13 0555) Weight change:  Last BM Date: 10/18/18  Intake/Output from previous day: 06/12 0701 - 06/13 0700 In: 38 [P.O.:580] Out: -  Last cbgs: CBG (last 3)  Recent Labs    10/18/18 1645 10/18/18 2133 10/19/18 0625  GLUCAP 173* 272* 151*     Physical Exam General: No apparent distress   HEENT: not dry Lungs: Normal effort. Lungs clear to auscultation, no crackles or wheezes. Cardiovascular:irregular rate and rhythm, no edema Abdomen: S/NT/ND; BS(+) Musculoskeletal:  unchanged Neurological: No new neurological deficits Wounds: N/A    Skin: clear  Aging changes Mental state: Alert, oriented, cooperative    Lab Results: BMET    Component Value Date/Time   NA 138 10/09/2018 0624   K 4.3 10/09/2018 0624   CL 100 10/09/2018 0624   CO2 28 10/09/2018 0624   GLUCOSE 230 (H) 10/09/2018 0624   BUN 18 10/09/2018 0624   CREATININE 1.08 (H) 10/09/2018 0624   CREATININE 0.84 04/19/2012 1700   CALCIUM 9.7 10/09/2018 0624   GFRNONAA 48 (L) 10/09/2018 0624   GFRAA 56 (L) 10/09/2018 0624   CBC    Component Value Date/Time   WBC 9.6 10/09/2018 0624   RBC 4.26 10/09/2018 0624   HGB 12.8 10/09/2018 0624   HCT 38.2 10/09/2018 0624   PLT 261 10/09/2018 0624   MCV 89.7 10/09/2018 0624   MCH 30.0 10/09/2018 0624   MCHC 33.5 10/09/2018 0624   RDW 13.5 10/09/2018 0624   LYMPHSABS 2.9 10/09/2018 0624   MONOABS 0.9 10/09/2018 0624   EOSABS 0.5 10/09/2018 0624   BASOSABS 0.1 10/09/2018 0624    Studies/Results: No results found.  Medications: I have reviewed the patient's current  medications.  Assessment/Plan:  1.  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  Continue CIR 2.  DVT prophylaxis with Eliquis 3.  Pain management with Lyrica, Cymbalta 4.  The patient is capable of making decisions on her own behalf 5.  Skin care 6.  PAF.  On Eliquis  7.  Diabetes type 2.  Continue with NovoLog and Levemir 8.  COPD/asthma.  Continue with nebulizers as directed 9.  Hypothyroidism.  On Synthroid 10.  GERD on Protonix 11.  Probable CKD.  Continue to monitor renal function 12.  Hypertension.  On Norvasc     Length of stay, days: [place] , MD 10/19/2018, 11:08 AM

## 2018-10-19 NOTE — Progress Notes (Signed)
Physical Therapy Session Note  Patient Details  Name: Casey Harrell MRN: [id number] Date of Birth: December 05, 1934  Today's Date: 10/19/2018 PT Individual Time: 1430-1530 PT Individual Time Calculation (min): 60 min   Short Term Goals: Week 1:  PT Short Term Goal 1 (Week 1): Pt will ambulate 55' w/ supervision in household environment  PT Short Term Goal 1 - Progress (Week 1): Met PT Short Term Goal 2 (Week 1): Pt will performed bed<>chair transfer w/ supervision PT Short Term Goal 2 - Progress (Week 1): Met PT Short Term Goal 3 (Week 1): Pt will maintain dynamic standing balance w/ supervision  PT Short Term Goal 3 - Progress (Week 1): Met Week 2:  PT Short Term Goal 1 (Week 2): =LTGs due to ELOS  Skilled Therapeutic Interventions/Progress Updates:    Pt received supine in bed and agreeable to PT. Supine>sit transfer without assist or cues. Gait training with RW to and from rehab gym with RW and distant supervision assist from PT [id number]. PT instructed pt in dynamic gait training over level surface x 80, weave through 8 cones with RW and without AD. stepping over 4 1 inch thresholds with RW and without AD. Supervision assist with RW and CGA-min A without AD with cues for safety and improved step through gait pattern. Pt also performed gait training up/down ramp and across mulched surface without AD and min-CGA from PT for safety and min  Cues for step height on the RLE. Dynamic balance training to complete peg board puzzle while standing on red wedge. Min cues for ankle strategy to prevent posterior LOB and for problem solving to detect and correct errors. Increased time due to fine motor deficits on the RUE. [facility] and all needs met.          Therapy Documentation Precautions:  Precautions Precautions: None Restrictions Weight Bearing Restrictions: No    Vital Signs: Therapy Vitals Temp: 98.4 F (36.9 C) Pulse Rate:  74 Resp: 18 BP: (!) 152/68 Patient Position (if appropriate): Lying Oxygen Therapy SpO2: 93 % O2 Device: Room Air Pain: 0/10    Therapy/Group: Individual Therapy  ALorie Phenix6/13/2020, 5:43 PM

## 2018-10-19 NOTE — Progress Notes (Signed)
Physical Therapy Session Note  Patient Details  Name: Casey Harrell MRN: [id number] Date of Birth: Jan 21, 1935  Today's Date: 10/19/2018 PT Individual Time: 0800-0900 PT Individual Time Calculation (min): 60 min   Short Term Goals: Week 1:  PT Short Term Goal 1 (Week 1): Pt will ambulate 11' w/ supervision in household environment  PT Short Term Goal 1 - Progress (Week 1): Met PT Short Term Goal 2 (Week 1): Pt will performed bed<>chair transfer w/ supervision PT Short Term Goal 2 - Progress (Week 1): Met PT Short Term Goal 3 (Week 1): Pt will maintain dynamic standing balance w/ supervision  PT Short Term Goal 3 - Progress (Week 1): Met Week 2:  PT Short Term Goal 1 (Week 2): =LTGs due to ELOS  Skilled Therapeutic Interventions/Progress Updates:   Pt received supine in bed and agreeable to PT. Supine>sit transfer without cues assist from PT. Toilet transfer with RW and distant supervision assist. Upper and lower body dressing sitting on toilet with set up assist from PT for time management. RN then present to administer medication  Gait training with RW 2 x 117f + [id number] without AD x [id number] supervision assist without AD and distant supervision assist with RW. Min cues for step length and height on the R when fatigued.   Dynamic balance training on Wii fit balance board, penguin slide x 2 and table tilt x 2. Moderate multimodal cues and min assist for improved use of ankle strategy to preform weight shift in all directions with emphasis on improved ankle strategy to prevent posterior weight shifting, as pt only implementing hip strategy in posterior direction.   Pt returned to room and performed ambulatory transfer to bed with supervision assist and RW. Sit>supine completed without assist, and left supine in bed with call bell in reach and all needs met.         Therapy Documentation Precautions:  Precautions Precautions: None Restrictions Weight Bearing Restrictions:  No Vital Signs: Therapy Vitals Temp: 98.1 F (36.7 C) Temp Source: Oral Pulse Rate: 62 Resp: 16 BP: (!) 139/50 Patient Position (if appropriate): Lying Oxygen Therapy SpO2: 90 % O2 Device: Room Air Pain: denies   Therapy/Group: Individual Therapy  AusLorie Phenix13/2020, 9:07 AM

## 2018-10-20 ENCOUNTER — Inpatient Hospital Stay (HOSPITAL_COMMUNITY): Payer: Medicare Other

## 2018-10-20 LAB — GLUCOSE, CAPILLARY
Glucose-Capillary: 125 mg/dL — ABNORMAL HIGH (ref 70–99)
Glucose-Capillary: 180 mg/dL — ABNORMAL HIGH (ref 70–99)
Glucose-Capillary: 152 mg/dL — ABNORMAL HIGH (ref 70–99)
Glucose-Capillary: 210 mg/dL — ABNORMAL HIGH (ref 70–99)

## 2018-10-20 NOTE — Discharge Summary (Addendum)
Physician Discharge Summary  Patient ID: Casey Harrell MRN: [id number] DOB/AGE: [birth year] 82 y.o.  Admit date: 10/08/2018 Discharge date: 10/21/2018  Discharge Diagnoses:  Active Problems:   Right pontine CVA (HCC)   AKI (acute kidney injury) (HCC)   Diabetes mellitus type 2 in nonobese (HCC)   PAF (paroxysmal atrial fibrillation) (HCC)   Reactive hypertension   Labile blood pressure   Labile blood glucose COPD with asthma Hypothyroidism GERD  Discharged Condition: Stable  Significant Diagnostic Studies: Ct Head Wo Contrast  Result Date: 10/08/2018 CLINICAL DATA:  Head injury after fall. EXAM: CT HEAD WITHOUT CONTRAST TECHNIQUE: Contiguous axial images were obtained from the base of the skull through the vertex without intravenous contrast. COMPARISON:  CT scan of Oct 03, 2018. FINDINGS: Brain: Mild diffuse cortical atrophy is noted. Mild chronic ischemic white matter disease is noted. No mass effect or midline shift is noted. Ventricular size is within normal limits. There is no evidence of mass lesion, hemorrhage or acute infarction. Vascular: No hyperdense vessel or unexpected calcification. Skull: Normal. Negative for fracture or focal lesion. Sinuses/Orbits: No acute finding. Other: None. IMPRESSION: Mild diffuse cortical atrophy. Mild chronic ischemic white matter disease. No acute intracranial abnormality seen. Electronically Signed   By: Lupita Raider M.D.   On: 10/08/2018 21:48   Mr Maxine Glenn Head Wo Contrast  Result Date: 10/03/2018 CLINICAL DATA:  Code stroke.  Acute facial droop. EXAM: MRI HEAD WITHOUT CONTRAST MRA HEAD WITHOUT CONTRAST TECHNIQUE: Multiplanar, multiecho pulse sequences of the brain and surrounding structures were obtained without intravenous contrast. Angiographic images of the head were obtained using MRA technique without contrast. COMPARISON:  Head CT same day.  MRI 03/30/2017 FINDINGS: MRI HEAD FINDINGS Brain: Acute 5 mm infarction in the right paramedian dorsal  lower pons. No other acute insult. Elsewhere, there are chronic small-vessel ischemic changes affecting the pons, with a few small foci of hemosiderin deposition. There are old small vessel infarctions within the cerebellum, some with hemosiderin deposition. There is extensive chronic small vessel ischemic change throughout the cerebral hemispheric white matter. There are old occipital cortical and subcortical infarctions, more extensive on the left than the right. There are old lacunar infarctions affecting the thalami and basal ganglia. No mass lesion. No hydrocephalus or extra-axial collection. Vascular: There is atherosclerotic calcification of the major vessels at the base of the brain. Skull and upper cervical spine: Negative Sinuses/Orbits: Clear/normal Other: None MRA HEAD FINDINGS Both internal carotid arteries are patent through the skull base and siphon regions. The anterior and middle cerebral vessels are patent. There are multiple intracranial stenoses and areas of irregularity, more extensive in the left anterior and middle cerebral vessels than right. Both vertebral arteries are patent to the basilar. 50% stenosis of the V4 segment on the left. Large right PICA. Basilar artery shows mild atherosclerotic irregularity but no flow limiting stenosis. There is flow within the superior cerebellar and posterior cerebral arteries, but the vessels show pronounced atherosclerotic irregularity and narrowing. IMPRESSION: Acute punctate 5 mm infarction in the right para median dorsal level or pons. Extensive small vessel ischemic changes elsewhere throughout the brainstem, cerebellum, thalami, basal ganglia and hemispheric white matter, some with old hemosiderin deposition. Old cortical infarctions in the occipital lobes, more extensive on the left than the right. MR angiography does not show any acute large or medium vessel occlusion. There is advanced diffuse intracranial atherosclerotic disease, most pronounced  in the left anterior and middle cerebral artery territories and in the posterior circulation branch  vessels Electronically Signed   By: Nelson Chimes M.D.   On: 10/03/2018 21:22   Mr Brain Wo Contrast  Result Date: 10/03/2018 CLINICAL DATA:  Code stroke.  Acute facial droop. EXAM: MRI HEAD WITHOUT CONTRAST MRA HEAD WITHOUT CONTRAST TECHNIQUE: Multiplanar, multiecho pulse sequences of the brain and surrounding structures were obtained without intravenous contrast. Angiographic images of the head were obtained using MRA technique without contrast. COMPARISON:  Head CT same day.  MRI 03/30/2017 FINDINGS: MRI HEAD FINDINGS Brain: Acute 5 mm infarction in the right paramedian dorsal lower pons. No other acute insult. Elsewhere, there are chronic small-vessel ischemic changes affecting the pons, with a few small foci of hemosiderin deposition. There are old small vessel infarctions within the cerebellum, some with hemosiderin deposition. There is extensive chronic small vessel ischemic change throughout the cerebral hemispheric white matter. There are old occipital cortical and subcortical infarctions, more extensive on the left than the right. There are old lacunar infarctions affecting the thalami and basal ganglia. No mass lesion. No hydrocephalus or extra-axial collection. Vascular: There is atherosclerotic calcification of the major vessels at the base of the brain. Skull and upper cervical spine: Negative Sinuses/Orbits: Clear/normal Other: None MRA HEAD FINDINGS Both internal carotid arteries are patent through the skull base and siphon regions. The anterior and middle cerebral vessels are patent. There are multiple intracranial stenoses and areas of irregularity, more extensive in the left anterior and middle cerebral vessels than right. Both vertebral arteries are patent to the basilar. 50% stenosis of the V4 segment on the left. Large right PICA. Basilar artery shows mild atherosclerotic irregularity but no  flow limiting stenosis. There is flow within the superior cerebellar and posterior cerebral arteries, but the vessels show pronounced atherosclerotic irregularity and narrowing. IMPRESSION: Acute punctate 5 mm infarction in the right para median dorsal level or pons. Extensive small vessel ischemic changes elsewhere throughout the brainstem, cerebellum, thalami, basal ganglia and hemispheric white matter, some with old hemosiderin deposition. Old cortical infarctions in the occipital lobes, more extensive on the left than the right. MR angiography does not show any acute large or medium vessel occlusion. There is advanced diffuse intracranial atherosclerotic disease, most pronounced in the left anterior and middle cerebral artery territories and in the posterior circulation branch vessels Electronically Signed   By: Nelson Chimes M.D.   On: 10/03/2018 21:22   Ct Head Code Stroke Wo Contrast  Result Date: 10/03/2018 CLINICAL DATA:  Code stroke. Initial evaluation for acute facial droop. EXAM: CT HEAD WITHOUT CONTRAST TECHNIQUE: Contiguous axial images were obtained from the base of the skull through the vertex without intravenous contrast. COMPARISON:  Previous MRI from 03/30/2017 FINDINGS: Brain: Generalized age-related cerebral volume loss with moderate chronic microvascular ischemic disease. Encephalomalacia within the bilateral occipital lobes compatible with remote ischemic infarcts. Additional small remote left basal ganglia lacunar infarct, with additional tiny remote left cerebellar infarct. No acute intracranial hemorrhage. No acute large vessel territory infarct. No mass lesion, midline shift or mass effect. No hydrocephalus. No extra-axial fluid collection. Vascular: No hyperdense vessel. Scattered vascular calcifications noted within the carotid siphons. Skull: Scalp soft tissues and calvarium within normal limits. Sinuses/Orbits: Globes and orbital soft tissues normal. Mild-to-moderate circumferential  mucosal thickening within the right sphenoid sinus. Paranasal sinuses are otherwise clear. Small right mastoid effusion noted. Other: None. ASPECTS [facility] Stroke Program Early CT Score) - Ganglionic level infarction (caudate, lentiform nuclei, internal capsule, insula, M1-M3 cortex): 7 - Supraganglionic infarction (M4-M6 cortex): 3 Total score (0-10 with  10 being normal): 10 IMPRESSION: 1. No acute intracranial infarct or other abnormality identified. 2. ASPECTS is 10. 3. Multiple chronic ischemic infarcts involving the left basal angling the and bilateral occipital lobes. 4. Underlying age-related cerebral atrophy with moderate chronic small vessel ischemic disease. These results were communicated to Dr. Amada JupiterKirkpatrick at 8:42 pmon 5/28/2020by text page via the [facility] messaging system. Electronically Signed   By: Rise MuBenjamin  McClintock M.D.   On: 10/03/2018 20:44   [facility] [place] [facility]  Result Date: 10/04/2018 Carotid Arterial Duplex Study Indications:  CVA. Risk Factors: Hypertension, Diabetes. Performing Technologist: Blanch MediaMegan Riddle RVS  Examination Guidelines: A complete evaluation includes B-mode imaging, spectral Doppler, color Doppler, and power Doppler as needed of all accessible portions of each vessel. Bilateral testing is considered an integral part of a complete examination. Limited examinations for reoccurring indications may be performed as noted.  Right Carotid Findings: +----------+--------+--------+--------+------------+--------+           PSV cm/sEDV cm/sStenosisDescribe    Comments +----------+--------+--------+--------+------------+--------+ CCA Prox  71      11              heterogenous         +----------+--------+--------+--------+------------+--------+ CCA Distal120     18              heterogenous         +----------+--------+--------+--------+------------+--------+ ICA Prox  61      12      1-39%   heterogenous          +----------+--------+--------+--------+------------+--------+ ICA Distal65      20                                   +----------+--------+--------+--------+------------+--------+ ECA       86                                           +----------+--------+--------+--------+------------+--------+ +----------+--------+-------+--------+-------------------+           PSV cm/sEDV cmsDescribeArm Pressure (mmHG) +----------+--------+-------+--------+-------------------+ [id number]                                         +----------+--------+-------+--------+-------------------+ +---------+--------+--+--------+-+---------+ VertebralPSV cm/s42EDV cm/s8Antegrade +---------+--------+--+--------+-+---------+  Left Carotid Findings: +----------+--------+--------+--------+------------+--------+           PSV cm/sEDV cm/sStenosisDescribe    Comments +----------+--------+--------+--------+------------+--------+ CCA Prox  73      16              heterogenous         +----------+--------+--------+--------+------------+--------+ CCA Distal53      10              heterogenous         +----------+--------+--------+--------+------------+--------+ ICA Prox  48      14      1-39%   heterogenous         +----------+--------+--------+--------+------------+--------+ ICA Distal59      16                                   +----------+--------+--------+--------+------------+--------+ ECA       87                                           +----------+--------+--------+--------+------------+--------+ +----------+--------+--------+--------+-------------------+  SubclavianPSV cm/sEDV cm/sDescribeArm Pressure (mmHG) +----------+--------+--------+--------+-------------------+           93                                          +----------+--------+--------+--------+-------------------+ +---------+--------+--+--------+-+---------+ VertebralPSV cm/s22EDV  cm/s4Antegrade +---------+--------+--+--------+-+---------+  Summary: Right Carotid: Velocities in the right ICA are consistent with a 1-39% stenosis. Left Carotid: Velocities in the left ICA are consistent with a 1-39% stenosis. Vertebrals: Bilateral vertebral arteries demonstrate antegrade flow. *See table(s) above for measurements and observations.  Electronically signed by Delia HeadyPramod Sethi MD on 10/04/2018 at 1:29:58 PM.    Final     Labs:  Basic Metabolic Panel: No results for input(s): NA, K, CL, CO2, GLUCOSE, BUN, CREATININE, CALCIUM, MG, PHOS in the last 168 hours.  CBC: No results for input(s): WBC, NEUTROABS, HGB, HCT, MCV, PLT in the last 168 hours.  CBG: Recent Labs  Lab 10/20/18 0638 10/20/18 1213 10/20/18 1656 10/20/18 2120 10/21/18 0631  GLUCAP 125* 180* 152* 210* 153*   Family history.  Mother with diabetes and CAD.  Father with CAD.  Sister with CAD.  Brother with lung cancer and CAD.  Maternal grandfather and paternal grandfather with CVA and lung cancer  Brief HPI:   Casey Harrell is an 84 year old right-handed female with history of PAF maintained on Eliquis, CVA receiving inpatient rehab services in the past, diabetes mellitus, hypertension, COPD.  She lives with son and family.  Ambulates without assistive device.  Presented 10/03/2018 with right-sided weakness and facial droop with mild slurred speech.  Cranial CT scan showed no acute abnormalities noted multiple chronic ischemic infarcts involving the left basal ganglia and bilateral occipital lobes.  Patient did not receive TPA.  MRI positive for acute punctate 5 mm infarct in the right paramedial dorsal level pons.  MRA with no large vessel occlusion.  Echocardiogram with ejection fraction of 65% hyperdynamic systolic function.  Carotid Dopplers with no ICA stenosis.  Neurology consulted remained on Eliquis as prior to admission.  Patient was admitted for a comprehensive rehab program.  Hospital Course: Casey Harrell was  admitted to rehab 10/08/2018 for inpatient therapies to consist of PT, ST and OT at least three hours five days a week. Past admission physiatrist, therapy team and rehab RN have worked together to provide customized collaborative inpatient rehab.  Pertaining to patient acute punctate infarct right paramedian dorsal level of pons as well as old cortical infarct noted she remained on Eliquis therapy and would follow-up with neurology services.  Pain management with the use of Lyrica as well as Cymbalta and good results.  History of PAF she remained on Eliquis cardiac rate controlled.  Hemoglobin A1c 9.6 insulin therapy as directed with full diabetic teaching.  Pertaining to patient's blood pressure her Coreg and Avapro remained on hold allowing permissive hypertension she did remain on low-dose amlodipine.  Patient should follow-up with her primary MD.  She remained on Synthroid for hypothyroidism.  History of COPD with asthma nebulizers as directed.  Blood pressures controlled on Norvasc.  Physical exam.  Blood pressure 137/66 pulse 66 temperature 98.1 respirations 18 oxygen saturations 98% room air. Constitutional.  Well-developed oriented HEENT Head.  Normocephalic and atraumatic Eyes pupils round and reactive to light no nystagmus without discharge Cardiovascular normal rate and rhythm exam reveals no friction rub or murmur heard Respiratory.  Effort normal no respiratory distress without wheezes  GI.  Soft no distention nontender without rebound Neurological.  Alert and oriented to person place and time no acute distress makes good eye contact with examiner fair awareness of deficits.  Speech was mildly slurred but intelligible.  Right upper extremity 4 out of 5 proximal to distal right lower extremity 4 out of 5 left upper and left lower extremity 5 out of 5.  Rehab course: During patient's stay in rehab weekly team conferences were held to monitor patient's progress, set goals and discuss barriers to  discharge. At admission, patient required minimum to moderate assist to ambulate 75 feet 1 person hand-held assistance, supervision sit to supine, minimal assist sit to stand.  Minimal guard upper body bathing minimal guard lower body bathing minimal guard upper body dressing minimal guard lower body dressing minimal assist toilet transfers  She  has had improvement in activity tolerance, balance, postural control as well as ability to compensate for deficits./She has had improvement in functional use RUE/LUE  and RLE/LLE as well as improvement in awareness.  Patient ambulates with a rolling walker to and from the rehab gym with rolling walker distant supervision 200 feet.  Instructed patient in dynamic gait training over level surfaces.  She did we have in and out of cones with rolling walker without assistive device.  Stepping over a 4 inch threshold with rolling walker without assistive device if needed.  Supervision assist with rolling walker and contact-guard minimal assist without assistive device and cues for safety and improve stepping.  She can gather her belongings for activities of daily living and homemaking family teaching completed plan discharge to home       Disposition: Discharge to home   Diet: Diabetic diet  Special Instructions: No smoking drinking or driving  Patient should follow-up with her primary MD in regards to blood pressure control currently with permissive hypertension Coreg and Avapro were on hold.  Medications at discharge. 1.  Tylenol as needed 2.  Norvasc 7.5 mg p.o. daily 3.  Eliquis 5 mg p.o. twice daily 4.  Lipitor 20 mg p.o. daily 5.  Voltaren gel 4 times daily to affected area 6.  Cymbalta 30 mg p.o. twice daily 7.  Flonase 1 spray each nostril daily 8.  NovoLog 7 units 3 times daily 9.  Levemir 28 units twice daily 10.  Synthroid 50 mcg daily 11.  Protonix 40 mg p.o. twice daily 12.  Lyrica 150 mg p.o. daily 13.  Senokot 1 tablet twice daily 14.   Resume inhaler as prior to admission  Discharge Instructions    Ambulatory referral to Neurology   Complete by: As directed    An appointment is requested in approximately 6 weeks right pontine infarction   Ambulatory referral to Physical Medicine Rehab   Complete by: As directed    Moderate complexity follow-up 1 to 2 weeks right pontine infarction      Follow-up Information    Marcello FennelPatel, Ankit Anil, MD Follow up.   Specialty: Physical Medicine and Rehabilitation Why: Office to call for appointment Contact information: [address] [place] [address] [phone]           Signed: Charlton AmorDaniel J Angiulli 10/22/2018, 7:13 AM Patient seen and examined by me on day of discharge. Maryla MorrowAnkit Patel, MD, ABPMR

## 2018-10-20 NOTE — Progress Notes (Signed)
Casey Harrell is a 84 y.o. female 31-Mar-1935 [id number]  Subjective: No new complaints. No new problems. Slept well. Feeling OK.  Objective: Vital signs in last 24 hours: Temp:  [97.6 F (36.4 C)-98.4 F (36.9 C)] 98.4 F (36.9 C) (06/14 0318) Pulse Rate:  [68-74] 71 (06/14 0318) Resp:  [16-18] 18 (06/14 0318) BP: (141-158)/(68) 158/68 (06/14 0318) SpO2:  [93 %-98 %] 98 % (06/14 0318) Weight change:  Last BM Date: 10/19/18  Intake/Output from previous day: 06/13 0701 - 06/14 0700 In: 1100 [P.O.:1100] Out: -  Last cbgs: CBG (last 3)  Recent Labs    10/19/18 1629 10/19/18 2119 10/20/18 0638  GLUCAP 246* 259* 125*     Physical Exam General: No apparent distress   HEENT: not dry Lungs: Normal effort. Lungs clear to auscultation, no crackles or wheezes. Cardiovascular: Regular rate and rhythm, no edema Abdomen: S/NT/ND; BS(+) Musculoskeletal:  unchanged Neurological: No new neurological deficits Wounds: N/A    Skin: clear  Aging changes Mental state: Alert, oriented, cooperative    Lab Results: BMET    Component Value Date/Time   NA 138 10/09/2018 0624   K 4.3 10/09/2018 0624   CL 100 10/09/2018 0624   CO2 28 10/09/2018 0624   GLUCOSE 230 (H) 10/09/2018 0624   BUN 18 10/09/2018 0624   CREATININE 1.08 (H) 10/09/2018 0624   CREATININE 0.84 04/19/2012 1700   CALCIUM 9.7 10/09/2018 0624   GFRNONAA 48 (L) 10/09/2018 0624   GFRAA 56 (L) 10/09/2018 0624   CBC    Component Value Date/Time   WBC 9.6 10/09/2018 0624   RBC 4.26 10/09/2018 0624   HGB 12.8 10/09/2018 0624   HCT 38.2 10/09/2018 0624   PLT 261 10/09/2018 0624   MCV 89.7 10/09/2018 0624   MCH 30.0 10/09/2018 0624   MCHC 33.5 10/09/2018 0624   RDW 13.5 10/09/2018 0624   LYMPHSABS 2.9 10/09/2018 0624   MONOABS 0.9 10/09/2018 0624   EOSABS 0.5 10/09/2018 0624   BASOSABS 0.1 10/09/2018 0624    Studies/Results: No results found.  Medications: I have reviewed the patient's current  medications.  Assessment/Plan:  1.  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  CIR 2.  DVT prophylaxis with Eliquis 3.  Pain management with Lyrica, Cymbalta 4.  The patient is capable of making decisions on her own behalf 5.  Skin care 6.  PAF.  Continue with Eliquis 7.  Type 2 diabetes.  Continue with NovoLog and Levemir 8.  COPD/asthma.  Continue with nebulized treatments 9.  Hypothyroidism.  Continue Synthroid 10.  GERD.  Continue Protonix 11.  Probable CKD.  Continue to monitor renal function 12.  Hypertension.  Continue with Norvasc     Length of stay, days: 12  Walker Kehr , MD 10/20/2018, 12:11 PM

## 2018-10-20 NOTE — Discharge Instructions (Addendum)
Inpatient Rehab Discharge Instructions  Casey Harrell Discharge date and time: No discharge date for patient encounter.   Activities/Precautions/ Functional Status: Activity: activity as tolerated Diet: diabetic diet Wound Care: none needed Functional status:  ___ No restrictions     ___ Walk up steps independently ___ 24/7 supervision/assistance   ___ Walk up steps with assistance ___ Intermittent supervision/assistance  ___ Bathe/dress independently ___ Walk with walker     _x__ Bathe/dress with assistance ___ Walk Independently    ___ Shower independently ___ Walk with assistance    ___ Shower with assistance ___ No alcohol     ___ Return to work/school ________    COMMUNITY REFERRALS UPON DISCHARGE:    Home Health:   PT     OT                        Agency:  [facility] Phone: [phone]     Special Instructions: No driving smoking or alcohol STROKE/TIA DISCHARGE INSTRUCTIONS SMOKING Cigarette smoking nearly doubles your risk of having a stroke & is the single most alterable risk factor  If you smoke or have smoked in the last 12 months, you are advised to quit smoking for your health.  Most of the excess cardiovascular risk related to smoking disappears within a year of stopping.  Ask you doctor about anti-smoking medications  [employer] Quit Line: 1-800-QUIT NOW  Free Smoking Cessation Classes [phone]  CHOLESTEROL Know your levels; limit fat & cholesterol in your diet  Lipid Panel     Component Value Date/Time   CHOL 101 10/04/2018 0432   TRIG 99 10/04/2018 0432   HDL 24 (L) 10/04/2018 0432   CHOLHDL 4.2 10/04/2018 0432   VLDL 20 10/04/2018 0432   LDLCALC 57 10/04/2018 0432      Many patients benefit from treatment even if their cholesterol is at goal.  Goal: Total Cholesterol (CHOL) less than 160  Goal:  Triglycerides (TRIG) less than 150  Goal:  HDL greater than 40  Goal:  LDL (LDLCALC) less than 100   BLOOD PRESSURE American Stroke  Association blood pressure target is less that 120/80 mm/Hg  Your discharge blood pressure is:  BP: (!) 153/82  Monitor your blood pressure  Limit your salt and alcohol intake  Many individuals will require more than one medication for high blood pressure  DIABETES (A1c is a blood sugar average for last 3 months) Goal HGBA1c is under 7% (HBGA1c is blood sugar average for last 3 months)  Diabetes:     Lab Results  Component Value Date   HGBA1C 9.6 (H) 10/04/2018     Your HGBA1c can be lowered with medications, healthy diet, and exercise.  Check your blood sugar as directed by your physician  Call your physician if you experience unexplained or low blood sugars.  PHYSICAL ACTIVITY/REHABILITATION Goal is 30 minutes at least 4 days per week  Activity: Increase activity slowly, Therapies: Physical Therapy: Home Health Return to work:   Activity decreases your risk of heart attack and stroke and makes your heart stronger.  It helps control your weight and blood pressure; helps you relax and can improve your mood.  Participate in a regular exercise program.  Talk with your doctor about the best form of exercise for you (dancing, walking, swimming, cycling).  DIET/WEIGHT Goal is to maintain a healthy weight  Your discharge diet is:  Diet Order  Diet heart healthy/carb modified Room service appropriate? Yes with Assist; Fluid consistency: Thin  Diet effective now              liquids Your height is:  Height: 5\' 1"  (154.9 cm) Your current weight is: Weight: 60.4 kg Your Body Mass Index (BMI) is:  BMI (Calculated): 25.17  Following the type of diet specifically designed for you will help prevent another stroke.  Your goal weight range is:    Your goal Body Mass Index (BMI) is 19-24.  Healthy food habits can help reduce 3 risk factors for stroke:  High cholesterol, hypertension, and excess weight.  RESOURCES Stroke/Support Group:  Call [phone]   STROKE EDUCATION  PROVIDED/REVIEWED AND GIVEN TO PATIENT Stroke warning signs and symptoms How to activate emergency medical system (call 911). Medications prescribed at discharge. Need for follow-up after discharge. Personal risk factors for stroke. Pneumonia vaccine given:  Flu vaccine given:  My questions have been answered, the writing is legible, and I understand these instructions.  I will adhere to these goals & educational materials that have been provided to me after my discharge from the hospital.      My questions have been answered and I understand these instructions. I will adhere to these goals and the provided educational materials after my discharge from the hospital.  Patient/Caregiver Signature _______________________________ Date __________  Clinician Signature _______________________________________ Date __________  Please bring this form and your medication list with you to all your follow-up doctor's appointments.   Information on my medicine - ELIQUIS (apixaban)  This medication education was reviewed with me or my healthcare representative as part of my discharge preparation.  The pharmacist that spoke with me during my hospital stay was:  Otho BellowsGreen, Terri L, [facility]  Why was Eliquis prescribed for you? Eliquis was prescribed for you to reduce the risk of a blood clot forming that can cause a stroke if you have a medical condition called atrial fibrillation (a type of irregular heartbeat).  What do You need to know about Eliquis ? Take your Eliquis TWICE DAILY - one tablet in the morning and one tablet in the evening with or without food. If you have difficulty swallowing the tablet whole please discuss with your pharmacist how to take the medication safely.  Take Eliquis exactly as prescribed by your doctor and DO NOT stop taking Eliquis without talking to the doctor who prescribed the medication.  Stopping may increase your risk of developing a stroke.  Refill your prescription  before you run out.  After discharge, you should have regular check-up appointments with your healthcare provider that is prescribing your Eliquis.  In the future your dose may need to be changed if your kidney function or weight changes by a significant amount or as you get older.  What do you do if you miss a dose? If you miss a dose, take it as soon as you remember on the same day and resume taking twice daily.  Do not take more than one dose of ELIQUIS at the same time to make up a missed dose.  Important Safety Information A possible side effect of Eliquis is bleeding. You should call your healthcare provider right away if you experience any of the following: ? Bleeding from an injury or your nose that does not stop. ? Unusual colored urine (red or dark brown) or unusual colored stools (red or black). ? Unusual bruising for unknown reasons. ? A serious fall or if you hit your head (  even if there is no bleeding).  Some medicines may interact with Eliquis and might increase your risk of bleeding or clotting while on Eliquis. To help avoid this, consult your healthcare provider or pharmacist prior to using any new prescription or non-prescription medications, including herbals, vitamins, non-steroidal anti-inflammatory drugs (NSAIDs) and supplements.  This website has more information on Eliquis (apixaban): http://www.eliquis.com/eliquis/home

## 2018-10-21 DIAGNOSIS — R7309 Other abnormal glucose: Secondary | ICD-10-CM

## 2018-10-21 DIAGNOSIS — R0989 Other specified symptoms and signs involving the circulatory and respiratory systems: Secondary | ICD-10-CM

## 2018-10-21 LAB — GLUCOSE, CAPILLARY: Glucose-Capillary: 153 mg/dL — ABNORMAL HIGH (ref 70–99)

## 2018-10-21 MED ORDER — DICLOFENAC SODIUM 1 % TD GEL: TRANSDERMAL | 1 refills | Status: DC

## 2018-10-21 MED ORDER — INSULIN DETEMIR 100 UNIT/ML FLEXPEN: 28.0000 [IU] | PEN_INJECTOR | Freq: Two times a day (BID) | SUBCUTANEOUS | 11 refills | Status: DC

## 2018-10-21 MED ORDER — PANTOPRAZOLE SODIUM 40 MG PO TBEC: 40.0000 mg | DELAYED_RELEASE_TABLET | Freq: Two times a day (BID) | ORAL | 2 refills | Status: DC

## 2018-10-21 MED ORDER — INSULIN ASPART 100 UNIT/ML ~~LOC~~ SOLN: 7.0000 [IU] | Freq: Three times a day (TID) | SUBCUTANEOUS | 11 refills | Status: DC

## 2018-10-21 MED ORDER — ATORVASTATIN CALCIUM 20 MG PO TABS: 20.0000 mg | ORAL_TABLET | Freq: Every day | ORAL | 1 refills | Status: DC

## 2018-10-21 MED ORDER — PROAIR RESPICLICK 108 (90 BASE) MCG/ACT IN AEPB: 2.0000 | INHALATION_SPRAY | RESPIRATORY_TRACT | 1 refills | Status: DC | PRN

## 2018-10-21 MED ORDER — APIXABAN 5 MG PO TABS: 5.0000 mg | ORAL_TABLET | Freq: Two times a day (BID) | ORAL | 0 refills | Status: DC

## 2018-10-21 MED ORDER — PREGABALIN 150 MG PO CAPS: 150.0000 mg | ORAL_CAPSULE | Freq: Every day | ORAL | 1 refills | Status: DC

## 2018-10-21 MED ORDER — AMLODIPINE BESYLATE 2.5 MG PO TABS: 7.5000 mg | ORAL_TABLET | Freq: Every day | ORAL | 1 refills | Status: DC

## 2018-10-21 MED ORDER — FLUTICASONE PROPIONATE 50 MCG/ACT NA SUSP: 1.0000 | Freq: Every day | NASAL | 0 refills | Status: DC

## 2018-10-21 MED ORDER — LEVOTHYROXINE SODIUM 50 MCG PO TABS: ORAL_TABLET | ORAL | 0 refills | Status: DC

## 2018-10-21 MED ORDER — DULOXETINE HCL 30 MG PO CPEP: 30.0000 mg | ORAL_CAPSULE | Freq: Two times a day (BID) | ORAL | 1 refills | Status: DC

## 2018-10-21 NOTE — Progress Notes (Signed)
Please see discharge summary as well.  Patient medically stable for discharge today.  Will require outpatient monitoring of blood glucose and blood pressures in optimization of medications accordingly.  We will plan to see patient in 1-2 weeks for transitional care management.

## 2018-10-21 NOTE — Progress Notes (Signed)
Patient discharged to family, all belongings accounted for. Pharmacy confirms no medications in storage there under her name. Spoke with PA about discharge instructions, no questions at this time.

## 2018-10-22 ENCOUNTER — Telehealth: Payer: Self-pay

## 2018-10-22 NOTE — Telephone Encounter (Signed)
Called daughter Lynelle Smoke and she stated that she wants to have a separate visit with her mom from the one her brother has scheduled due to her mom having memory issues and needs in home care for her mom. Doxy appointment is scheduled for 10/28/18.

## 2018-10-22 NOTE — Telephone Encounter (Signed)
Copied from [place] #[id number]. Topic: General - Other >> Oct 16, 2018  3:00 PM Rainey Pines A wrote: Patients daughter Lynelle Smoke called and would like a callback from Dr. Elease Hashimoto nurse in regards to her mom wells being and power of attorney. Tammy would like a callback.

## 2018-10-22 NOTE — Progress Notes (Signed)
Social Work  Discharge Note  The overall goal for the admission was met for:   Discharge location: Yes - pt returning home with son, [name], who can provide intermittent assistance  Length of Stay: Yes - 13 days  Discharge activity level: Yes - modified independent except for showering and steps @ supervision  Home/community participation: Yes  Services provided included: MD, RD, PT, OT, RN, Pharmacy and [place]: [facility] Medicare  Follow-up services arranged: Home Health: PT, OT via [place] and Patient/Family request agency HH: [facility], DME: NA (had all needed DME)  Comments (or additional information):     Contact info:  [place] @ [phone]      Daughter, Laddie Aquas @ [phone]  Patient/Family verbalized understanding of follow-up arrangements: Yes  Individual responsible for coordination of the follow-up plan: pt  Confirmed correct DME delivered:  NA - had needed DME    Lenetta Piche

## 2018-10-23 ENCOUNTER — Telehealth: Payer: Self-pay | Admitting: Registered Nurse

## 2018-10-23 ENCOUNTER — Ambulatory Visit (INDEPENDENT_AMBULATORY_CARE_PROVIDER_SITE_OTHER): Payer: Medicare Other | Admitting: Family Medicine

## 2018-10-23 ENCOUNTER — Other Ambulatory Visit: Payer: Self-pay

## 2018-10-23 DIAGNOSIS — Z9181 History of falling: Secondary | ICD-10-CM | POA: Diagnosis not present

## 2018-10-23 DIAGNOSIS — I10 Essential (primary) hypertension: Secondary | ICD-10-CM | POA: Diagnosis not present

## 2018-10-23 DIAGNOSIS — J449 Chronic obstructive pulmonary disease, unspecified: Secondary | ICD-10-CM | POA: Diagnosis not present

## 2018-10-23 DIAGNOSIS — Z794 Long term (current) use of insulin: Secondary | ICD-10-CM | POA: Diagnosis not present

## 2018-10-23 DIAGNOSIS — K219 Gastro-esophageal reflux disease without esophagitis: Secondary | ICD-10-CM | POA: Diagnosis not present

## 2018-10-23 DIAGNOSIS — I69351 Hemiplegia and hemiparesis following cerebral infarction affecting right dominant side: Secondary | ICD-10-CM | POA: Diagnosis not present

## 2018-10-23 DIAGNOSIS — I480 Paroxysmal atrial fibrillation: Secondary | ICD-10-CM | POA: Diagnosis not present

## 2018-10-23 DIAGNOSIS — Z7901 Long term (current) use of anticoagulants: Secondary | ICD-10-CM | POA: Diagnosis not present

## 2018-10-23 DIAGNOSIS — I69392 Facial weakness following cerebral infarction: Secondary | ICD-10-CM | POA: Diagnosis not present

## 2018-10-23 DIAGNOSIS — I639 Cerebral infarction, unspecified: Secondary | ICD-10-CM

## 2018-10-23 DIAGNOSIS — E039 Hypothyroidism, unspecified: Secondary | ICD-10-CM | POA: Diagnosis not present

## 2018-10-23 DIAGNOSIS — I6350 Cerebral infarction due to unspecified occlusion or stenosis of unspecified cerebral artery: Secondary | ICD-10-CM

## 2018-10-23 DIAGNOSIS — E1151 Type 2 diabetes mellitus with diabetic peripheral angiopathy without gangrene: Secondary | ICD-10-CM | POA: Diagnosis not present

## 2018-10-23 DIAGNOSIS — E1159 Type 2 diabetes mellitus with other circulatory complications: Secondary | ICD-10-CM

## 2018-10-23 DIAGNOSIS — E1142 Type 2 diabetes mellitus with diabetic polyneuropathy: Secondary | ICD-10-CM | POA: Diagnosis not present

## 2018-10-23 MED ORDER — FREESTYLE LIBRE 14 DAY READER DEVI: 1.0000 | Freq: Every day | 1 refills | Status: DC

## 2018-10-23 MED ORDER — FREESTYLE LIBRE 14 DAY SENSOR MISC: 1.0000 | 3 refills | Status: DC

## 2018-10-23 NOTE — Telephone Encounter (Signed)
Transitional Care call Transitional Questions answered by [name]  Patient name: Casey Harrell  DOB: 1934/09/23 1. Are you/is patient experiencing any problems since coming home? No a. Are there any questions regarding any aspect of care? No 2. Are there any questions regarding medications administration/dosing? No a. Are meds being taken as prescribed? Yes b. "Patient should review meds with caller to confirm" medication List Reviewed 3. Have there been any falls? No 4. Has Home Health been to the house and/or have they contacted you? Yes, [place]  a. If not, have you tried to contact them? NA b. Can we help you contact them? NA 5. Are bowels and bladder emptying properly? Yes a. Are there any unexpected incontinence issues? No b. If applicable, is patient following bowel/bladder programs? NA 6. Any fevers, problems with breathing, unexpected pain? No 7. Are there any skin problems or new areas of breakdown? No 8. Has the patient/family member arranged specialty MD follow up (ie cardiology/neurology/renal/surgical/etc.)?  [name] was instructed to call [facility] to schedule HFU appointment, he verbalizes understanding.  a. Can we help arrange? No 9. Does the patient need any other services or support that we can help arrange? No 10. Are caregivers following through as expected in assisting the patient? Yes 11. Has the patient quit smoking, drinking alcohol, or using drugs as recommended? [name] reports [name] doesn't smoke, drink alcohol or use illicit drugs.   Appointment date/time 11/04/2018,  arrival time 1:20 for 1:40 appointment, with Dr. Posey Pronto. At [place]

## 2018-10-23 NOTE — Progress Notes (Signed)
Patient ID: Casey Harrell, female   DOB: 04/01/1935, 84 y.o.   MRN: [id number]   This visit type was conducted due to national recommendations for restrictions regarding the COVID-19 pandemic in an effort to limit this patient's exposure and mitigate transmission in our community.   Virtual Visit via Video Note  I connected with Zollie ScaleKay Shark on 10/23/18 at  3:00 PM EDT by a video enabled telemedicine application and verified that I am speaking with the correct person using two identifiers.  Location patient: home Location provider:work or home office Persons participating in the virtual visit: patient, provider  Patient's son also accompanied her on this visit and assisted with the technical aspect of the call.  He is her primary caregiver at this point  I discussed the limitations of evaluation and management by telemedicine and the availability of in person appointments. The patient expressed understanding and agreed to proceed.   HPI: Patient is seen for follow-up regarding recent hospital admission.  She was admitted on 10/03/2018 with acute right pontine CVA.  She went to rehab stay and was discharged on 15 June.  She presented with right-sided weakness and right facial droop and slurred speech.  CT head showed no acute abnormalities but evidence for multiple chronic ischemic infarcts.  Did not receive TPA.  MRI showed acute punctate 5 mm infarct right paramedial dorsal level pons.  MRA showed no large vessel occlusions.  Echocardiogram normal ejection fraction.  Carotid Dopplers with no ICA stenosis.  Patient has history of A. fib and remains on Eliquis.  She remains on amlodipine but they held her Coreg and Avapro allowing permissive hypertension.  She remains on amlodipine at this time at home blood pressures been stable.  Her A1c was 9.6%.  Son states that she may have been missing several doses of insulin prior to admission.  At discharge she was placed on Levemir 28 units twice daily along  with Humalog 7 units 3 times daily.  However, family has been giving her Humalog 7 units twice daily.  She had CBG earlier today of 7466.  Family would like to have external glucose monitor if possible.  Her symptoms are stable.  She has had no difficulty swallowing.  Appetite is stable.  Ambulating with a walker and sometimes with a cane.  She has home PT and OT.   ROS: See pertinent positives and negatives per HPI.  Past Medical History:  Diagnosis Date  . ASTHMA 07/15/2008  . COPD (chronic obstructive pulmonary disease) (HCC)   . DIABETES-TYPE 2 07/15/2008  . DIABETIC PERIPHERAL NEUROPATHY 09/02/2009  . GERD 07/15/2008  . HYPERTENSION 07/15/2008  . Normal cardiac stress test    low risk Nuc 2009, Feb 2011  . Paroxysmal atrial fibrillation (HCC)   . PVD (peripheral vascular disease) (HCC) 06/18/09   RCE  . Stroke (HCC)   . Vertigo     Past Surgical History:  Procedure Laterality Date  . ABDOMINAL HYSTERECTOMY  1971  . APPENDECTOMY  1971  . CAROTID ENDARTERECTOMY  06/18/09   RCE  . CESAREAN SECTION    . colectomy and colostomy    . FLEXIBLE SIGMOIDOSCOPY     diverticulitis  . FOOT SURGERY     right foot X 2  . LAPAROTOMY     X 3 with adhesions lysis  . TONSILLECTOMY    . TUBAL LIGATION      Family History  Problem Relation Age of Onset  . Diabetes Mother   . Arthritis Mother   .  Heart disease Mother   . Heart disease Father   . Heart disease Sister   . Heart disease Brother   . Cancer Brother 60       lung cancer  . Celiac disease Sister   . Stroke Maternal Grandfather   . Cancer Paternal Grandfather        lung  . Heart disease Sister   . Asthma Sister     SOCIAL HX: Lives at home.  She has excellent family support   Current Outpatient Medications:  .  acetaminophen (TYLENOL) 325 MG tablet, Take 325-650 mg by mouth every 6 (six) hours as needed (for pain). , Disp: , Rfl:  .  Albuterol Sulfate (PROAIR RESPICLICK) 108 (90 Base) MCG/ACT AEPB, Inhale 2 puffs into  the lungs every 4 (four) hours as needed., Disp: 1 each, Rfl: 1 .  amLODipine (NORVASC) 2.5 MG tablet, Take 3 tablets (7.5 mg total) by mouth daily., Disp: 30 tablet, Rfl: 1 .  apixaban (ELIQUIS) 5 MG TABS tablet, Take 1 tablet (5 mg total) by mouth 2 (two) times daily., Disp: 60 tablet, Rfl: 0 .  atorvastatin (LIPITOR) 20 MG tablet, Take 1 tablet (20 mg total) by mouth daily., Disp: 30 tablet, Rfl: 1 .  BIOTIN PO, Take 1 tablet by mouth daily., Disp: , Rfl:  .  CALCIUM PO, Take 1 tablet by mouth daily., Disp: , Rfl:  .  Cholecalciferol (VITAMIN D3) 2000 units TABS, Take 2,000 Units by mouth daily. Reported on 04/26/2015, Disp: 30 tablet, Rfl: 0 .  Continuous Blood Gluc Receiver (FREESTYLE LIBRE 14 DAY READER) DEVI, Apply 1 Device topically daily., Disp: 1 Device, Rfl: 1 .  Continuous Blood Gluc Sensor (FREESTYLE LIBRE 14 DAY SENSOR) MISC, Inject 1 Device into the skin every 14 (fourteen) days., Disp: 2 each, Rfl: 3 .  diclofenac sodium (VOLTAREN) 1 % GEL, APPLY 2GRAMS TOPICALLY 4 TIMES DAILY TO RIGHT ANKLE, Disp: 100 g, Rfl: 1 .  DULoxetine (CYMBALTA) 30 MG capsule, Take 1 capsule (30 mg total) by mouth 2 (two) times daily., Disp: 60 capsule, Rfl: 1 .  fluticasone (FLONASE) 50 MCG/ACT nasal spray, Place 1 spray into both nostrils daily., Disp: 16 g, Rfl: 0 .  glucose blood (ACCU-CHEK AVIVA PLUS) test strip, Test twice daily.  Dx e11.9, Disp: 200 each, Rfl: 12 .  insulin aspart (NOVOLOG) 100 UNIT/ML injection, Inject 7 Units into the skin 3 (three) times daily with meals., Disp: 10 mL, Rfl: 11 .  Insulin Detemir (LEVEMIR) 100 UNIT/ML Pen, Inject 28 Units into the skin 2 (two) times daily., Disp: 15 mL, Rfl: 11 .  Insulin Detemir (LEVEMIR) 100 UNIT/ML Pen, Inject 28 Units into the skin 2 (two) times daily., Disp: 15 mL, Rfl: 11 .  levothyroxine (SYNTHROID) 50 MCG tablet, TAKE 1 TABLET BY MOUTH EVERY DAY BEFORE BREAKFAST, Disp: 90 tablet, Rfl: 0 .  pantoprazole (PROTONIX) 40 MG tablet, Take 1 tablet  (40 mg total) by mouth 2 (two) times daily., Disp: 180 tablet, Rfl: 2 .  pregabalin (LYRICA) 150 MG capsule, Take 1 capsule (150 mg total) by mouth daily., Disp: 30 capsule, Rfl: 1  EXAM:  VITALS per patient if applicable:  GENERAL: alert, oriented, appears well and in no acute distress  HEENT: atraumatic, conjunttiva clear, no obvious abnormalities on inspection of external nose and ears  NECK: normal movements of the head and neck  LUNGS: on inspection no signs of respiratory distress, breathing rate appears normal, no obvious gross SOB, gasping or wheezing  CV:  no obvious cyanosis  MS: moves all visible extremities without noticeable abnormality  PSYCH/NEURO: pleasant and cooperative, no obvious depression or anxiety, speech and thought processing grossly intact  ASSESSMENT AND PLAN:  Discussed the following assessment and plan:  #1 recent acute right pontine CVA.  Patient has some continued facial droop but is speaking fairly clearly and no major swallowing difficulties. Discussed secondary prevention goals -LDL less than 70 -A1c less than 7% (consider < 8 age and multiple co- Morbidities) -Blood pressure 130/85  #2 type 2 diabetes.  History of poor control.  History of poor dietary compliance.  Recent home blood sugars improving according to son -Discussed freestyle libre monitor and they will check to see if this is covered by insurance -Continue close monitoring of blood sugars and we will plan to have 3-week follow-up to touch base to review her readings.  We have suggested they reduce her Humalog to 5 units twice daily  #3 history of dyslipidemia treated with statin  #4 hypertension currently stable on amlodipine.  Previously she has taken Avapro and Coreg additionally -Recommend close monitoring of blood pressure and will need to go back on additional blood pressure medications if consistently greater than 130/80  #5 atrial fibrillation-patient remains on  Eliquis    I discussed the assessment and treatment plan with the patient. The patient was provided an opportunity to ask questions and all were answered. The patient agreed with the plan and demonstrated an understanding of the instructions.   The patient was advised to call back or seek an in-person evaluation if the symptoms worsen or if the condition fails to improve as anticipated.   Carolann Littler, MD

## 2018-10-24 ENCOUNTER — Telehealth: Payer: Self-pay

## 2018-10-24 NOTE — Telephone Encounter (Signed)
Benjamine Mola, PT from [facility] called requesting verbal orders for HHPT 1wk1, 2wk2. Orders approved and given per discharge summary.

## 2018-10-28 ENCOUNTER — Ambulatory Visit: Payer: Medicare Other | Admitting: Family Medicine

## 2018-10-28 ENCOUNTER — Telehealth: Payer: Self-pay | Admitting: *Deleted

## 2018-10-28 ENCOUNTER — Other Ambulatory Visit: Payer: Self-pay

## 2018-10-28 DIAGNOSIS — I10 Essential (primary) hypertension: Secondary | ICD-10-CM | POA: Diagnosis not present

## 2018-10-28 DIAGNOSIS — Z9181 History of falling: Secondary | ICD-10-CM | POA: Diagnosis not present

## 2018-10-28 DIAGNOSIS — J449 Chronic obstructive pulmonary disease, unspecified: Secondary | ICD-10-CM | POA: Diagnosis not present

## 2018-10-28 DIAGNOSIS — I69392 Facial weakness following cerebral infarction: Secondary | ICD-10-CM | POA: Diagnosis not present

## 2018-10-28 DIAGNOSIS — Z7901 Long term (current) use of anticoagulants: Secondary | ICD-10-CM | POA: Diagnosis not present

## 2018-10-28 DIAGNOSIS — E039 Hypothyroidism, unspecified: Secondary | ICD-10-CM | POA: Diagnosis not present

## 2018-10-28 DIAGNOSIS — E1142 Type 2 diabetes mellitus with diabetic polyneuropathy: Secondary | ICD-10-CM | POA: Diagnosis not present

## 2018-10-28 DIAGNOSIS — K219 Gastro-esophageal reflux disease without esophagitis: Secondary | ICD-10-CM | POA: Diagnosis not present

## 2018-10-28 DIAGNOSIS — I480 Paroxysmal atrial fibrillation: Secondary | ICD-10-CM | POA: Diagnosis not present

## 2018-10-28 DIAGNOSIS — I69351 Hemiplegia and hemiparesis following cerebral infarction affecting right dominant side: Secondary | ICD-10-CM | POA: Diagnosis not present

## 2018-10-28 DIAGNOSIS — E1151 Type 2 diabetes mellitus with diabetic peripheral angiopathy without gangrene: Secondary | ICD-10-CM | POA: Diagnosis not present

## 2018-10-28 DIAGNOSIS — Z794 Long term (current) use of insulin: Secondary | ICD-10-CM | POA: Diagnosis not present

## 2018-10-28 NOTE — Telephone Encounter (Signed)
Copied from [place] [phone]. Topic: General - Other >> Oct 28, 2018  3:31 PM Rainey Pines A wrote: Patients son [name] would like a callback  from Dr.[place] nurse in regards to his wife being the caregiver for patient and the insurance company compensating the caregiver. Patient wanted to know if he needs docuemntation from provider stating this.

## 2018-10-29 ENCOUNTER — Telehealth: Payer: Self-pay

## 2018-10-29 NOTE — Telephone Encounter (Signed)
Copied from [place] [phone]. Topic: General - Other >> Oct 29, 2018  9:06 AM Celene Kras A wrote: Reason for CRM: Pts son called stating he is sending in [facility] paperwork in for pt. Please fax back to the fax on the cover sheet. Please advise.

## 2018-10-30 DIAGNOSIS — I69351 Hemiplegia and hemiparesis following cerebral infarction affecting right dominant side: Secondary | ICD-10-CM | POA: Diagnosis not present

## 2018-10-30 DIAGNOSIS — E039 Hypothyroidism, unspecified: Secondary | ICD-10-CM | POA: Diagnosis not present

## 2018-10-30 DIAGNOSIS — Z9181 History of falling: Secondary | ICD-10-CM | POA: Diagnosis not present

## 2018-10-30 DIAGNOSIS — K219 Gastro-esophageal reflux disease without esophagitis: Secondary | ICD-10-CM | POA: Diagnosis not present

## 2018-10-30 DIAGNOSIS — I10 Essential (primary) hypertension: Secondary | ICD-10-CM | POA: Diagnosis not present

## 2018-10-30 DIAGNOSIS — J449 Chronic obstructive pulmonary disease, unspecified: Secondary | ICD-10-CM | POA: Diagnosis not present

## 2018-10-30 DIAGNOSIS — E1142 Type 2 diabetes mellitus with diabetic polyneuropathy: Secondary | ICD-10-CM | POA: Diagnosis not present

## 2018-10-30 DIAGNOSIS — Z794 Long term (current) use of insulin: Secondary | ICD-10-CM | POA: Diagnosis not present

## 2018-10-30 DIAGNOSIS — Z7901 Long term (current) use of anticoagulants: Secondary | ICD-10-CM | POA: Diagnosis not present

## 2018-10-30 DIAGNOSIS — I480 Paroxysmal atrial fibrillation: Secondary | ICD-10-CM | POA: Diagnosis not present

## 2018-10-30 DIAGNOSIS — E1151 Type 2 diabetes mellitus with diabetic peripheral angiopathy without gangrene: Secondary | ICD-10-CM | POA: Diagnosis not present

## 2018-10-30 DIAGNOSIS — I69392 Facial weakness following cerebral infarction: Secondary | ICD-10-CM | POA: Diagnosis not present

## 2018-10-31 NOTE — Telephone Encounter (Signed)
Family member dropped off form today-received from the parking lot.  Forms given to Ria Comment to place in the doctors folder.

## 2018-10-31 NOTE — Telephone Encounter (Signed)
FMLA forms have been received. See phone note from 10/31/2018 for further documentation.

## 2018-10-31 NOTE — Telephone Encounter (Signed)
Forms have been put in Dr. Anastasio Auerbach red folder.

## 2018-11-01 ENCOUNTER — Telehealth: Payer: Self-pay | Admitting: Family Medicine

## 2018-11-01 DIAGNOSIS — Z794 Long term (current) use of insulin: Secondary | ICD-10-CM | POA: Diagnosis not present

## 2018-11-01 DIAGNOSIS — J449 Chronic obstructive pulmonary disease, unspecified: Secondary | ICD-10-CM | POA: Diagnosis not present

## 2018-11-01 DIAGNOSIS — Z7901 Long term (current) use of anticoagulants: Secondary | ICD-10-CM | POA: Diagnosis not present

## 2018-11-01 DIAGNOSIS — Z9181 History of falling: Secondary | ICD-10-CM | POA: Diagnosis not present

## 2018-11-01 DIAGNOSIS — E1142 Type 2 diabetes mellitus with diabetic polyneuropathy: Secondary | ICD-10-CM | POA: Diagnosis not present

## 2018-11-01 DIAGNOSIS — I480 Paroxysmal atrial fibrillation: Secondary | ICD-10-CM | POA: Diagnosis not present

## 2018-11-01 DIAGNOSIS — I69351 Hemiplegia and hemiparesis following cerebral infarction affecting right dominant side: Secondary | ICD-10-CM | POA: Diagnosis not present

## 2018-11-01 DIAGNOSIS — K219 Gastro-esophageal reflux disease without esophagitis: Secondary | ICD-10-CM | POA: Diagnosis not present

## 2018-11-01 DIAGNOSIS — I10 Essential (primary) hypertension: Secondary | ICD-10-CM | POA: Diagnosis not present

## 2018-11-01 DIAGNOSIS — E039 Hypothyroidism, unspecified: Secondary | ICD-10-CM | POA: Diagnosis not present

## 2018-11-01 DIAGNOSIS — E1151 Type 2 diabetes mellitus with diabetic peripheral angiopathy without gangrene: Secondary | ICD-10-CM | POA: Diagnosis not present

## 2018-11-01 DIAGNOSIS — I69392 Facial weakness following cerebral infarction: Secondary | ICD-10-CM | POA: Diagnosis not present

## 2018-11-01 NOTE — Telephone Encounter (Signed)
Home Health Verbal Orders - Caller/Agency: Bruce with [facility] Number: [phone], OK to leave a message Requesting OT/PT/Skilled Nursing/Social Work/Speech Therapy: OT Frequency: 2x a week for 3 weeks.

## 2018-11-01 NOTE — Telephone Encounter (Signed)
Called Bruce with [facility] and gave him the verbal OK per Dr. Elease Hashimoto. Bruce verbalized an understanding.

## 2018-11-01 NOTE — Telephone Encounter (Signed)
OK 

## 2018-11-01 NOTE — Telephone Encounter (Signed)
Please advise 

## 2018-11-04 ENCOUNTER — Encounter: Payer: Medicare Other | Admitting: Physical Medicine & Rehabilitation

## 2018-11-05 DIAGNOSIS — Z7901 Long term (current) use of anticoagulants: Secondary | ICD-10-CM | POA: Diagnosis not present

## 2018-11-05 DIAGNOSIS — E1151 Type 2 diabetes mellitus with diabetic peripheral angiopathy without gangrene: Secondary | ICD-10-CM | POA: Diagnosis not present

## 2018-11-05 DIAGNOSIS — J449 Chronic obstructive pulmonary disease, unspecified: Secondary | ICD-10-CM | POA: Diagnosis not present

## 2018-11-05 DIAGNOSIS — K219 Gastro-esophageal reflux disease without esophagitis: Secondary | ICD-10-CM | POA: Diagnosis not present

## 2018-11-05 DIAGNOSIS — I69351 Hemiplegia and hemiparesis following cerebral infarction affecting right dominant side: Secondary | ICD-10-CM | POA: Diagnosis not present

## 2018-11-05 DIAGNOSIS — E1142 Type 2 diabetes mellitus with diabetic polyneuropathy: Secondary | ICD-10-CM | POA: Diagnosis not present

## 2018-11-05 DIAGNOSIS — I480 Paroxysmal atrial fibrillation: Secondary | ICD-10-CM | POA: Diagnosis not present

## 2018-11-05 DIAGNOSIS — I10 Essential (primary) hypertension: Secondary | ICD-10-CM | POA: Diagnosis not present

## 2018-11-05 DIAGNOSIS — E039 Hypothyroidism, unspecified: Secondary | ICD-10-CM | POA: Diagnosis not present

## 2018-11-05 DIAGNOSIS — Z794 Long term (current) use of insulin: Secondary | ICD-10-CM | POA: Diagnosis not present

## 2018-11-05 DIAGNOSIS — Z9181 History of falling: Secondary | ICD-10-CM | POA: Diagnosis not present

## 2018-11-05 DIAGNOSIS — I69392 Facial weakness following cerebral infarction: Secondary | ICD-10-CM | POA: Diagnosis not present

## 2018-11-06 DIAGNOSIS — E039 Hypothyroidism, unspecified: Secondary | ICD-10-CM | POA: Diagnosis not present

## 2018-11-06 DIAGNOSIS — Z794 Long term (current) use of insulin: Secondary | ICD-10-CM | POA: Diagnosis not present

## 2018-11-06 DIAGNOSIS — J449 Chronic obstructive pulmonary disease, unspecified: Secondary | ICD-10-CM | POA: Diagnosis not present

## 2018-11-06 DIAGNOSIS — Z7901 Long term (current) use of anticoagulants: Secondary | ICD-10-CM | POA: Diagnosis not present

## 2018-11-06 DIAGNOSIS — I10 Essential (primary) hypertension: Secondary | ICD-10-CM | POA: Diagnosis not present

## 2018-11-06 DIAGNOSIS — E1151 Type 2 diabetes mellitus with diabetic peripheral angiopathy without gangrene: Secondary | ICD-10-CM | POA: Diagnosis not present

## 2018-11-06 DIAGNOSIS — E1142 Type 2 diabetes mellitus with diabetic polyneuropathy: Secondary | ICD-10-CM | POA: Diagnosis not present

## 2018-11-06 DIAGNOSIS — I69392 Facial weakness following cerebral infarction: Secondary | ICD-10-CM | POA: Diagnosis not present

## 2018-11-06 DIAGNOSIS — I69351 Hemiplegia and hemiparesis following cerebral infarction affecting right dominant side: Secondary | ICD-10-CM | POA: Diagnosis not present

## 2018-11-06 DIAGNOSIS — Z9181 History of falling: Secondary | ICD-10-CM | POA: Diagnosis not present

## 2018-11-06 DIAGNOSIS — I480 Paroxysmal atrial fibrillation: Secondary | ICD-10-CM | POA: Diagnosis not present

## 2018-11-06 DIAGNOSIS — K219 Gastro-esophageal reflux disease without esophagitis: Secondary | ICD-10-CM | POA: Diagnosis not present

## 2018-11-07 ENCOUNTER — Encounter: Payer: Self-pay | Admitting: Physical Medicine & Rehabilitation

## 2018-11-07 ENCOUNTER — Encounter: Payer: Medicare Other | Attending: Physical Medicine & Rehabilitation | Admitting: Physical Medicine & Rehabilitation

## 2018-11-07 ENCOUNTER — Other Ambulatory Visit: Payer: Self-pay

## 2018-11-07 VITALS — BP 112/58 | HR 54 | Temp 97.5°F | Ht 59.75 in | Wt 135.0 lb

## 2018-11-07 DIAGNOSIS — E119 Type 2 diabetes mellitus without complications: Secondary | ICD-10-CM | POA: Diagnosis not present

## 2018-11-07 DIAGNOSIS — E1151 Type 2 diabetes mellitus with diabetic peripheral angiopathy without gangrene: Secondary | ICD-10-CM | POA: Diagnosis not present

## 2018-11-07 DIAGNOSIS — J449 Chronic obstructive pulmonary disease, unspecified: Secondary | ICD-10-CM | POA: Diagnosis not present

## 2018-11-07 DIAGNOSIS — K219 Gastro-esophageal reflux disease without esophagitis: Secondary | ICD-10-CM | POA: Diagnosis not present

## 2018-11-07 DIAGNOSIS — I6930 Unspecified sequelae of cerebral infarction: Secondary | ICD-10-CM | POA: Diagnosis not present

## 2018-11-07 DIAGNOSIS — I6350 Cerebral infarction due to unspecified occlusion or stenosis of unspecified cerebral artery: Secondary | ICD-10-CM | POA: Insufficient documentation

## 2018-11-07 DIAGNOSIS — Z7901 Long term (current) use of anticoagulants: Secondary | ICD-10-CM | POA: Diagnosis not present

## 2018-11-07 DIAGNOSIS — I10 Essential (primary) hypertension: Secondary | ICD-10-CM | POA: Diagnosis not present

## 2018-11-07 DIAGNOSIS — E1142 Type 2 diabetes mellitus with diabetic polyneuropathy: Secondary | ICD-10-CM | POA: Diagnosis not present

## 2018-11-07 DIAGNOSIS — I639 Cerebral infarction, unspecified: Secondary | ICD-10-CM

## 2018-11-07 DIAGNOSIS — I69392 Facial weakness following cerebral infarction: Secondary | ICD-10-CM | POA: Diagnosis not present

## 2018-11-07 DIAGNOSIS — I480 Paroxysmal atrial fibrillation: Secondary | ICD-10-CM | POA: Diagnosis not present

## 2018-11-07 DIAGNOSIS — Z9181 History of falling: Secondary | ICD-10-CM | POA: Diagnosis not present

## 2018-11-07 DIAGNOSIS — Z8673 Personal history of transient ischemic attack (TIA), and cerebral infarction without residual deficits: Secondary | ICD-10-CM | POA: Insufficient documentation

## 2018-11-07 DIAGNOSIS — E039 Hypothyroidism, unspecified: Secondary | ICD-10-CM | POA: Diagnosis not present

## 2018-11-07 DIAGNOSIS — I69351 Hemiplegia and hemiparesis following cerebral infarction affecting right dominant side: Secondary | ICD-10-CM | POA: Diagnosis not present

## 2018-11-07 DIAGNOSIS — Z794 Long term (current) use of insulin: Secondary | ICD-10-CM | POA: Diagnosis not present

## 2018-11-07 NOTE — Progress Notes (Signed)
Subjective:    Patient ID: Casey Harrell, female    DOB: [birth year], 84 y.o.   MRN: [id number]  HPI 84 year old right-handed female with history of PAF, CVA, diabetes mellitus, hypertension, COPD presents for hospital follow-up after receiving CIR for right paramedian dorsal pons infarct.  Admit date: 10/08/2018 Discharge date: 10/21/2018  Daughter-in-law supplements. At discharge, she was instructed to follow up with PCP and Neurology, but patient does not know.  Pain is absent, but continues to take Cymbalta and Lyrica.  She is not sure why she has to take these medications. CBGs have been relatively high. BP is controlled.   Therapies: 2/week DME: Previously owned Mobility: Conservation officer, nature at all times  Pain Inventory Average Pain 0 Pain Right Now 0 My pain is no pain  In the last 24 hours, has pain interfered with the following? General activity 0 Relation with others 0 Enjoyment of life 0 What TIME of day is your pain at its worst? no pain Sleep (in general) Good  Pain is worse with: no pain Pain improves with: no pain Relief from Meds: no pain  Mobility walk with assistance use a cane use a walker how many minutes can you walk? 15 ability to climb steps?  yes do you drive?  no  Function not employed: date last employed na I need assistance with the following:  meal prep  Neuro/Psych bladder control problems trouble walking dizziness confusion  Prior Studies Any changes since last visit?  no  Physicians involved in your care Any changes since last visit?  no   Family History  Problem Relation Age of Onset  . Diabetes Mother   . Arthritis Mother   . Heart disease Mother   . Heart disease Father   . Heart disease Sister   . Heart disease Brother   . Cancer Brother 60       lung cancer  . Celiac disease Sister   . Stroke Maternal Grandfather   . Cancer Paternal Grandfather        lung  . Heart disease Sister   . Asthma Sister    Social History    Socioeconomic History  . Marital status: Married    Spouse name: Not on file  . Number of children: Y  . Years of education: Not on file  . Highest education level: Not on file  Occupational History  . Occupation: stay at home  Social Needs  . Financial resource strain: Not on file  . Food insecurity    Worry: Not on file    Inability: Not on file  . Transportation needs    Medical: Not on file    Non-medical: Not on file  Tobacco Use  . Smoking status: Never Smoker  . Smokeless tobacco: Never Used  Substance and Sexual Activity  . Alcohol use: No  . Drug use: No  . Sexual activity: Not on file  Lifestyle  . Physical activity    Days per week: Not on file    Minutes per session: Not on file  . Stress: Not on file  Relationships  . Social Herbalist on phone: Not on file    Gets together: Not on file    Attends religious service: Not on file    Active member of club or organization: Not on file    Attends meetings of clubs or organizations: Not on file    Relationship status: Not on file  Other Topics Concern  . Not  on file  Social History Narrative  . Not on file   Past Surgical History:  Procedure Laterality Date  . ABDOMINAL HYSTERECTOMY  1971  . APPENDECTOMY  1971  . CAROTID ENDARTERECTOMY  06/18/09   RCE  . CESAREAN SECTION    . colectomy and colostomy    . FLEXIBLE SIGMOIDOSCOPY     diverticulitis  . FOOT SURGERY     right foot X 2  . LAPAROTOMY     X 3 with adhesions lysis  . TONSILLECTOMY    . TUBAL LIGATION     Past Medical History:  Diagnosis Date  . ASTHMA 07/15/2008  . COPD (chronic obstructive pulmonary disease) (HCC)   . DIABETES-TYPE 2 07/15/2008  . DIABETIC PERIPHERAL NEUROPATHY 09/02/2009  . GERD 07/15/2008  . HYPERTENSION 07/15/2008  . Normal cardiac stress test    low risk Nuc 2009, Feb 2011  . Paroxysmal atrial fibrillation (HCC)   . PVD (peripheral vascular disease) (HCC) 06/18/09   RCE  . Stroke (HCC)   . Vertigo     BP (!) 112/58   Pulse (!) 54   Temp (!) 97.5 F (36.4 C)   Ht 4' 11.75" (1.518 m)   Wt 135 lb (61.2 kg)   SpO2 92%   BMI 26.59 kg/m   Opioid Risk Score:   Fall Risk Score:  `1  Depression screen PHQ 2/9  [facility] 11/07/2018 01/03/2018 04/26/2016 03/25/2015 03/02/2014  Decreased Interest 0 3 0 0 0  Down, Depressed, Hopeless 0 3 0 0 0  PHQ - 2 Score 0 6 0 0 0  Altered sleeping 0 3 - - -  Tired, decreased energy 0 3 - - -  Change in appetite 0 3 - - -  Feeling bad or failure about yourself  0 0 - - -  Trouble concentrating 0 0 - - -  Moving slowly or fidgety/restless 0 0 - - -  Suicidal thoughts 0 0 - - -  PHQ-9 Score 0 15 - - -  Some recent data might be hidden    Review of Systems  Constitutional: Negative.   HENT: Negative.   Eyes: Negative.   Respiratory: Negative.   Cardiovascular: Negative.   Gastrointestinal: Positive for constipation.  Endocrine: Negative.   Genitourinary: Positive for difficulty urinating.  Musculoskeletal: Positive for gait problem.  Skin: Negative.   Allergic/Immunologic: Negative.   Neurological: Positive for dizziness.  Hematological: Negative.   Psychiatric/Behavioral: Positive for confusion.  All other systems reviewed and are negative.      Objective:   Physical Exam Constitutional: No distress . Vital signs reviewed. HENT: Normocephalic.  Atraumatic. Eyes: EOMI. No discharge. Cardiovascular: No JVD. Respiratory: Normal effort. GI: Non-distended. Musc: No edema or tenderness in extremities. Neurological: Alert Right facial weakness Unable to close right eye, tracking deficiency to right Motor: RUE/RLE 5/5 prox to distal.  LUE/LLE 5/5, stable.    Skin: Intact. Warm and dry. Psychiatric: pleasant.     Assessment & Plan:  84 year old right-handed female with history of PAF, CVA, diabetes mellitus, hypertension, COPD presents for hospital follow-up after receiving CIR for right paramedian dorsal pons infarct.   1.  Right side weakness with facial droop secondary to acute punctate infarct right paramedial dorsal level of pons as well old cortical infarcts in the occipital lobes more extensive on the left than on the right             Continue therapies  Follow up with Neurology - needs appointment  ?  MRI - discussed with patient and daughter, confusion over imaging timing and location  2. Pain Management:   Discussed conversation with PCP regarding weaning medications Lyrica and Cymbalta   3.  Diabetes mellitus.    Elevated per patient  Recommend follow up with PCP  5.  Hypertension:   Cont meds  Relatively controlled at present  6. Gait abnormality  Cont cane for safety  Meds reviewed  Referrals reviewed - Needs PCP and Neuro appointment All questions answered  >25 minutes spent with >20 minutes in counseling and coordination of care regarding aforementioned

## 2018-11-08 ENCOUNTER — Telehealth: Payer: Self-pay | Admitting: Family Medicine

## 2018-11-08 DIAGNOSIS — E1142 Type 2 diabetes mellitus with diabetic polyneuropathy: Secondary | ICD-10-CM | POA: Diagnosis not present

## 2018-11-08 DIAGNOSIS — J449 Chronic obstructive pulmonary disease, unspecified: Secondary | ICD-10-CM | POA: Diagnosis not present

## 2018-11-08 DIAGNOSIS — I69351 Hemiplegia and hemiparesis following cerebral infarction affecting right dominant side: Secondary | ICD-10-CM | POA: Diagnosis not present

## 2018-11-08 DIAGNOSIS — Z7901 Long term (current) use of anticoagulants: Secondary | ICD-10-CM | POA: Diagnosis not present

## 2018-11-08 DIAGNOSIS — I69392 Facial weakness following cerebral infarction: Secondary | ICD-10-CM | POA: Diagnosis not present

## 2018-11-08 DIAGNOSIS — I480 Paroxysmal atrial fibrillation: Secondary | ICD-10-CM | POA: Diagnosis not present

## 2018-11-08 DIAGNOSIS — K219 Gastro-esophageal reflux disease without esophagitis: Secondary | ICD-10-CM | POA: Diagnosis not present

## 2018-11-08 DIAGNOSIS — Z9181 History of falling: Secondary | ICD-10-CM | POA: Diagnosis not present

## 2018-11-08 DIAGNOSIS — E039 Hypothyroidism, unspecified: Secondary | ICD-10-CM | POA: Diagnosis not present

## 2018-11-08 DIAGNOSIS — E1151 Type 2 diabetes mellitus with diabetic peripheral angiopathy without gangrene: Secondary | ICD-10-CM | POA: Diagnosis not present

## 2018-11-08 DIAGNOSIS — I10 Essential (primary) hypertension: Secondary | ICD-10-CM | POA: Diagnosis not present

## 2018-11-08 DIAGNOSIS — Z794 Long term (current) use of insulin: Secondary | ICD-10-CM | POA: Diagnosis not present

## 2018-11-08 NOTE — Telephone Encounter (Signed)
[place] calling to report the patient blood sugar is ranging in the 200s Can a some call to adjust her medication. Please advise [phone]

## 2018-11-11 DIAGNOSIS — I10 Essential (primary) hypertension: Secondary | ICD-10-CM | POA: Diagnosis not present

## 2018-11-11 DIAGNOSIS — K219 Gastro-esophageal reflux disease without esophagitis: Secondary | ICD-10-CM | POA: Diagnosis not present

## 2018-11-11 DIAGNOSIS — I69392 Facial weakness following cerebral infarction: Secondary | ICD-10-CM | POA: Diagnosis not present

## 2018-11-11 DIAGNOSIS — E039 Hypothyroidism, unspecified: Secondary | ICD-10-CM | POA: Diagnosis not present

## 2018-11-11 DIAGNOSIS — Z7901 Long term (current) use of anticoagulants: Secondary | ICD-10-CM | POA: Diagnosis not present

## 2018-11-11 DIAGNOSIS — E1151 Type 2 diabetes mellitus with diabetic peripheral angiopathy without gangrene: Secondary | ICD-10-CM | POA: Diagnosis not present

## 2018-11-11 DIAGNOSIS — I69351 Hemiplegia and hemiparesis following cerebral infarction affecting right dominant side: Secondary | ICD-10-CM | POA: Diagnosis not present

## 2018-11-11 DIAGNOSIS — E1142 Type 2 diabetes mellitus with diabetic polyneuropathy: Secondary | ICD-10-CM | POA: Diagnosis not present

## 2018-11-11 DIAGNOSIS — J449 Chronic obstructive pulmonary disease, unspecified: Secondary | ICD-10-CM | POA: Diagnosis not present

## 2018-11-11 DIAGNOSIS — Z794 Long term (current) use of insulin: Secondary | ICD-10-CM | POA: Diagnosis not present

## 2018-11-11 DIAGNOSIS — I480 Paroxysmal atrial fibrillation: Secondary | ICD-10-CM | POA: Diagnosis not present

## 2018-11-11 DIAGNOSIS — Z9181 History of falling: Secondary | ICD-10-CM | POA: Diagnosis not present

## 2018-11-11 NOTE — Telephone Encounter (Signed)
Patient has sent over [facility] paperwork that has been faxed to [place] [employer] at [phone]

## 2018-11-11 NOTE — Telephone Encounter (Signed)
Casey Harrell and she stated that they are checking her BS's twice daily before meals and they are in the 200's both times.  Please advise.

## 2018-11-11 NOTE — Telephone Encounter (Signed)
Casey Harrell and gave her the message from Dr. Elease Hashimoto.   [facility] and gave him the new medication dosages and he stated that she is already taking 30 units of the Levemir and she is also taking 8-10 units of the Novolog tid with meals. [name] stated that she does rotate her injection sites also.

## 2018-11-11 NOTE — Telephone Encounter (Signed)
Increase Levemir to 30 units bid and increase Novolog to 8 units tid with meals.

## 2018-11-14 ENCOUNTER — Other Ambulatory Visit: Payer: Self-pay | Admitting: Family Medicine

## 2018-11-15 DIAGNOSIS — Z7901 Long term (current) use of anticoagulants: Secondary | ICD-10-CM | POA: Diagnosis not present

## 2018-11-15 DIAGNOSIS — Z9181 History of falling: Secondary | ICD-10-CM | POA: Diagnosis not present

## 2018-11-15 DIAGNOSIS — E039 Hypothyroidism, unspecified: Secondary | ICD-10-CM | POA: Diagnosis not present

## 2018-11-15 DIAGNOSIS — I10 Essential (primary) hypertension: Secondary | ICD-10-CM | POA: Diagnosis not present

## 2018-11-15 DIAGNOSIS — I480 Paroxysmal atrial fibrillation: Secondary | ICD-10-CM | POA: Diagnosis not present

## 2018-11-15 DIAGNOSIS — I69351 Hemiplegia and hemiparesis following cerebral infarction affecting right dominant side: Secondary | ICD-10-CM | POA: Diagnosis not present

## 2018-11-15 DIAGNOSIS — E1151 Type 2 diabetes mellitus with diabetic peripheral angiopathy without gangrene: Secondary | ICD-10-CM | POA: Diagnosis not present

## 2018-11-15 DIAGNOSIS — K219 Gastro-esophageal reflux disease without esophagitis: Secondary | ICD-10-CM | POA: Diagnosis not present

## 2018-11-15 DIAGNOSIS — Z794 Long term (current) use of insulin: Secondary | ICD-10-CM | POA: Diagnosis not present

## 2018-11-15 DIAGNOSIS — E1142 Type 2 diabetes mellitus with diabetic polyneuropathy: Secondary | ICD-10-CM | POA: Diagnosis not present

## 2018-11-15 DIAGNOSIS — I69392 Facial weakness following cerebral infarction: Secondary | ICD-10-CM | POA: Diagnosis not present

## 2018-11-15 DIAGNOSIS — J449 Chronic obstructive pulmonary disease, unspecified: Secondary | ICD-10-CM | POA: Diagnosis not present

## 2018-11-15 NOTE — Telephone Encounter (Signed)
This medication is not on current medication list. Looks like it was discontinued?  OK to continue? Or deny?

## 2018-11-17 NOTE — Telephone Encounter (Signed)
Looks like she was taken off at latest hospitalization.   Confirm if she is still taking.  If not, take off list.  If still taking refill for 6 months.Marland Kitchen

## 2018-11-19 NOTE — Telephone Encounter (Signed)
Called patient and spoke to Casey Harrell and they stated that patient is not taking this medication. I advised that I will discontinue this medication and they verbalized an understanding.

## 2018-11-22 ENCOUNTER — Other Ambulatory Visit: Payer: Self-pay

## 2018-11-22 ENCOUNTER — Telehealth: Payer: Self-pay | Admitting: Family Medicine

## 2018-11-22 MED ORDER — ATORVASTATIN CALCIUM 20 MG PO TABS: 20.0000 mg | ORAL_TABLET | Freq: Every day | ORAL | 1 refills | Status: DC

## 2018-11-22 NOTE — Telephone Encounter (Signed)
Refill OK

## 2018-11-22 NOTE — Telephone Encounter (Signed)
Pt daughter in law gwen  is calling and pt needs a refill on atorvastatin. This med was prescribed when pt was in hospital[facility]. Please call gwen back

## 2018-11-22 NOTE — Telephone Encounter (Signed)
Medication has been sent to the pharmacy. 

## 2018-11-22 NOTE — Telephone Encounter (Signed)
Please see message. OK to fill?

## 2018-12-04 ENCOUNTER — Other Ambulatory Visit: Payer: Self-pay | Admitting: Family Medicine

## 2018-12-04 DIAGNOSIS — E119 Type 2 diabetes mellitus without complications: Secondary | ICD-10-CM | POA: Diagnosis not present

## 2018-12-04 LAB — HM DIABETES EYE EXAM

## 2018-12-04 NOTE — Telephone Encounter (Signed)
Last OV 10/23/18, No future OV  Not currently on medication list.

## 2018-12-05 ENCOUNTER — Inpatient Hospital Stay: Payer: Medicare Other | Admitting: Neurology

## 2018-12-05 NOTE — Telephone Encounter (Signed)
May refill once.  Should only be taking sparingly for severe anxiety symptoms.

## 2018-12-06 NOTE — Telephone Encounter (Signed)
I called in 1 month worth to pharmacy/thx dmf

## 2018-12-06 NOTE — Telephone Encounter (Signed)
I called this into pharm VM :)/thx dmf

## 2018-12-15 ENCOUNTER — Other Ambulatory Visit: Payer: Self-pay | Admitting: Family Medicine

## 2018-12-17 ENCOUNTER — Ambulatory Visit: Payer: Medicare Other | Admitting: Cardiovascular Disease

## 2018-12-18 ENCOUNTER — Other Ambulatory Visit: Payer: Self-pay | Admitting: Family Medicine

## 2018-12-18 NOTE — Telephone Encounter (Signed)
Looks like this was discontinued by Lauraine Rinne PA-C  I am not sure who this is? Do you know if this doctor is prescribing DM medications now? Or do I need to call her son??

## 2018-12-19 NOTE — Telephone Encounter (Signed)
I am fairly certain that he works for the hospitalist team.  Verify with family that she has been taking this all along.  Metformin frequently gets held in hospital but her renal function has been good- so refill if she has been taking.

## 2018-12-20 ENCOUNTER — Other Ambulatory Visit: Payer: Self-pay | Admitting: Family Medicine

## 2018-12-23 NOTE — Telephone Encounter (Signed)
Called patient and left a detailed voice message on voice mail to see if she is still taking Metformin and to call back and let us know if so.  OK for PEC to discuss and/or advise.  CRM Created.

## 2018-12-24 ENCOUNTER — Other Ambulatory Visit: Payer: Self-pay | Admitting: Family Medicine

## 2019-01-09 ENCOUNTER — Encounter: Payer: Medicare Other | Admitting: Physical Medicine & Rehabilitation

## 2019-01-10 ENCOUNTER — Other Ambulatory Visit: Payer: Self-pay | Admitting: Family Medicine

## 2019-01-12 ENCOUNTER — Other Ambulatory Visit: Payer: Self-pay | Admitting: Family Medicine

## 2019-01-15 ENCOUNTER — Other Ambulatory Visit: Payer: Self-pay | Admitting: Family Medicine

## 2019-01-15 NOTE — Telephone Encounter (Signed)
Medication Refill - Medication: Eliquis  Has the patient contacted their pharmacy? Yes.   Pts son called and is requesting to have this sent in the pharmacy as she is out of the medication. Pts son states he has had this medication filled by PCP before. Please advise.  (Agent: If no, request that the patient contact the pharmacy for the refill.) (Agent: If yes, when and what did the pharmacy advise?)  Preferred Pharmacy (with phone number or street name):  [id number] [place] [place] - [address]  [address] [place] [address]  Phone: [phone] Fax: [phone]  Not a 24 hour pharmacy; exact hours not known.     Agent: Please be advised that RX refills may take up to 3 business days. We ask that you follow-up with your pharmacy.

## 2019-01-15 NOTE — Telephone Encounter (Signed)
Requested medication (s) are due for refill today: yes  Requested medication (s) are on the active medication list: yes  Last refill: 10/2018  Future visit scheduled: no  Notes to clinic: review for refill   Requested Prescriptions  Pending Prescriptions Disp Refills   apixaban (ELIQUIS) 5 MG TABS tablet 60 tablet 0    Sig: Take 1 tablet (5 mg total) by mouth 2 (two) times daily.     Hematology:  Anticoagulants Failed - 01/15/2019 10:13 AM      Failed - Cr in normal range and within 360 days    Creat  Date Value Ref Range Status  04/19/2012 0.84 0.50 - 1.10 mg/dL Final   Creatinine, Ser  Date Value Ref Range Status  10/09/2018 1.08 (H) 0.44 - 1.00 mg/dL Final         Passed - HGB in normal range and within 360 days    Hemoglobin  Date Value Ref Range Status  10/09/2018 12.8 12.0 - 15.0 g/dL Final         Passed - PLT in normal range and within 360 days    Platelets  Date Value Ref Range Status  10/09/2018 261 150 - 400 K/uL Final         Passed - HCT in normal range and within 360 days    HCT  Date Value Ref Range Status  10/09/2018 38.2 36.0 - 46.0 % Final         Passed - Valid encounter within last 12 months    Recent Outpatient Visits          2 months ago Essential hypertension   Therapist, music at [facility], Alinda Sierras, MD   5 months ago Type 2 diabetes, uncontrolled, with neuropathy ([place])   Therapist, music at [facility], Alinda Sierras, MD   6 months ago [place] at [facility], Alinda Sierras, MD   1 year ago Type 2 diabetes mellitus with other circulatory complication, with long-term current use of insulin ([place])   Therapist, music at [facility], Alinda Sierras, MD   1 year ago [place] optician at [facility], Alinda Sierras, MD

## 2019-01-15 NOTE — Telephone Encounter (Signed)
Needs follow up.  Refill for one month- but set up 30 minute follow up. One of her children will need to bring her.

## 2019-01-15 NOTE — Telephone Encounter (Signed)
Left message on machine for Casey Harrell to return our call. CRM

## 2019-01-16 ENCOUNTER — Other Ambulatory Visit: Payer: Self-pay | Admitting: Family Medicine

## 2019-01-18 ENCOUNTER — Other Ambulatory Visit: Payer: Self-pay | Admitting: Family Medicine

## 2019-01-21 MED ORDER — APIXABAN 5 MG PO TABS: 5.0000 mg | ORAL_TABLET | Freq: Two times a day (BID) | ORAL | 0 refills | Status: DC

## 2019-01-29 ENCOUNTER — Other Ambulatory Visit: Payer: Self-pay | Admitting: Family Medicine

## 2019-02-15 ENCOUNTER — Other Ambulatory Visit: Payer: Self-pay | Admitting: Family Medicine

## 2019-02-16 ENCOUNTER — Other Ambulatory Visit: Payer: Self-pay | Admitting: Family Medicine

## 2019-02-17 NOTE — Telephone Encounter (Signed)
The original prescription was discontinued on 10/21/2018 by Cathlyn Parsons, PA-C for the following reason: Stop Taking at Discharge. Renewing this prescription may not be appropriate.

## 2019-02-19 NOTE — Telephone Encounter (Signed)
The original prescription was discontinued on 03/19/2018 by Anibal Henderson, CMA for the following reason: Reorder. Renewing this prescription may not be appropriate.

## 2019-02-28 ENCOUNTER — Other Ambulatory Visit: Payer: Self-pay

## 2019-02-28 NOTE — Patient Outreach (Signed)
[facility]) Care Management  02/28/2019  Casey Harrell [birth year] [id number]   Medication Adherence call to Mrs. Casey Harrell Telephone call to Patient regarding Medication Adherence unable to reach patient. Casey Harrell is past due on Irbesartan 150 mg under [place].   [facility] Management Direct Dial [phone]  Fax [phone] [email or web address]

## 2019-03-11 ENCOUNTER — Telehealth: Payer: Self-pay

## 2019-03-11 NOTE — Telephone Encounter (Signed)
[facility] for Lyrica with no response. Per chart there are no pending requests. Pharmacy advised of this. Pt's son has been trying to pick up this prescription for several days. Per chart pt has not had this prescribed since 10/2018 and was refilled 11/2018. Pt has not been taking this regularly.   Please advise on continuing this medication as pt has not taken in several months.

## 2019-03-12 ENCOUNTER — Telehealth: Payer: Self-pay | Admitting: Family Medicine

## 2019-03-12 ENCOUNTER — Other Ambulatory Visit: Payer: Self-pay | Admitting: Family Medicine

## 2019-03-12 NOTE — Telephone Encounter (Signed)
RX REFILL pregabalin (LYRICA) 150 MG capsule  PHARMACY  [id number] [place], [place] - [address] [phone] (Phone) [phone] (Fax)     Sons call back  [phone]

## 2019-03-12 NOTE — Telephone Encounter (Signed)
Patients son is checking status of medication Please advise Call back [phone]

## 2019-03-12 NOTE — Telephone Encounter (Signed)
Attempted to reach pt, to schedule OV. No answer. Left vm to call back

## 2019-03-13 ENCOUNTER — Other Ambulatory Visit: Payer: Self-pay

## 2019-03-13 MED ORDER — APIXABAN 5 MG PO TABS: 5.0000 mg | ORAL_TABLET | Freq: Two times a day (BID) | ORAL | 0 refills | Status: DC

## 2019-03-13 NOTE — Telephone Encounter (Signed)
Attempted to reach son, no answer.   1. Message was routed to provider who is on vacation until Monday. 2. Pt needs to schedule a office with provider next week to address

## 2019-03-13 NOTE — Telephone Encounter (Signed)
Pt's son returned call, he stated he was upset that rx for lyrica was not filled. Explained to him this was last filled for #30 with 1 Rf back in June. Asked him has she been taking this medication. He states he was unsure and was actually unsure what the rx is for. Told him the generic reasoning for why ppl taking it and he was unsure if she actually needs to be on it. Informed him I could ask provider who will return on Monday. He stated pt needed a refill for her eliquis. I informed him I could refill but looks like she is do for a follow up. He also wanted to know if she truly does need a follow up.

## 2019-03-13 NOTE — Patient Outreach (Signed)
[facility]) Care Management  03/13/2019  Casey Harrell 1934/10/30 [id number]   Medication Adherence call to [name] HIPPA Compliant Voice message left with a call back number. [name] is past due on Irbesartan 150 mg under [place].   [facility] Management Direct Dial [phone]  Fax [phone] [name]

## 2019-03-14 NOTE — Telephone Encounter (Signed)
This was addressed in other telephone encounter. Closing this phone note.

## 2019-03-15 NOTE — Telephone Encounter (Signed)
She has very complicated medical history.   Sounds like there is some confusion regarding her medications and I do think we need to follow up.  OK to set up Doxy or phone follow up if they are anxious about office follow up- though she is due for some labs.

## 2019-03-16 ENCOUNTER — Encounter (HOSPITAL_COMMUNITY): Payer: Self-pay | Admitting: Emergency Medicine

## 2019-03-16 ENCOUNTER — Other Ambulatory Visit: Payer: Self-pay

## 2019-03-16 ENCOUNTER — Emergency Department (HOSPITAL_COMMUNITY): Payer: Medicare Other

## 2019-03-16 ENCOUNTER — Emergency Department (HOSPITAL_COMMUNITY)
Admission: EM | Admit: 2019-03-16 | Discharge: 2019-03-17 | Disposition: A | Discharge status: Home / Self Care | Payer: Medicare Other | Attending: Emergency Medicine | Admitting: Emergency Medicine

## 2019-03-16 ENCOUNTER — Other Ambulatory Visit: Payer: Self-pay | Admitting: Family Medicine

## 2019-03-16 DIAGNOSIS — Z8673 Personal history of transient ischemic attack (TIA), and cerebral infarction without residual deficits: Secondary | ICD-10-CM | POA: Insufficient documentation

## 2019-03-16 DIAGNOSIS — Z743 Need for continuous supervision: Secondary | ICD-10-CM | POA: Diagnosis not present

## 2019-03-16 DIAGNOSIS — R410 Disorientation, unspecified: Secondary | ICD-10-CM | POA: Diagnosis not present

## 2019-03-16 DIAGNOSIS — R1013 Epigastric pain: Secondary | ICD-10-CM | POA: Insufficient documentation

## 2019-03-16 DIAGNOSIS — E1165 Type 2 diabetes mellitus with hyperglycemia: Secondary | ICD-10-CM | POA: Diagnosis not present

## 2019-03-16 DIAGNOSIS — E1122 Type 2 diabetes mellitus with diabetic chronic kidney disease: Secondary | ICD-10-CM | POA: Insufficient documentation

## 2019-03-16 DIAGNOSIS — R739 Hyperglycemia, unspecified: Secondary | ICD-10-CM

## 2019-03-16 DIAGNOSIS — Z7901 Long term (current) use of anticoagulants: Secondary | ICD-10-CM | POA: Insufficient documentation

## 2019-03-16 DIAGNOSIS — E039 Hypothyroidism, unspecified: Secondary | ICD-10-CM | POA: Diagnosis not present

## 2019-03-16 DIAGNOSIS — R110 Nausea: Secondary | ICD-10-CM | POA: Diagnosis not present

## 2019-03-16 DIAGNOSIS — R112 Nausea with vomiting, unspecified: Secondary | ICD-10-CM | POA: Insufficient documentation

## 2019-03-16 DIAGNOSIS — J449 Chronic obstructive pulmonary disease, unspecified: Secondary | ICD-10-CM | POA: Insufficient documentation

## 2019-03-16 DIAGNOSIS — Z79899 Other long term (current) drug therapy: Secondary | ICD-10-CM | POA: Insufficient documentation

## 2019-03-16 DIAGNOSIS — K5790 Diverticulosis of intestine, part unspecified, without perforation or abscess without bleeding: Secondary | ICD-10-CM | POA: Diagnosis not present

## 2019-03-16 DIAGNOSIS — N183 Chronic kidney disease, stage 3 unspecified: Secondary | ICD-10-CM | POA: Insufficient documentation

## 2019-03-16 DIAGNOSIS — R918 Other nonspecific abnormal finding of lung field: Secondary | ICD-10-CM | POA: Diagnosis not present

## 2019-03-16 DIAGNOSIS — R4020 Unspecified coma: Secondary | ICD-10-CM | POA: Diagnosis not present

## 2019-03-16 DIAGNOSIS — Z794 Long term (current) use of insulin: Secondary | ICD-10-CM | POA: Insufficient documentation

## 2019-03-16 DIAGNOSIS — I129 Hypertensive chronic kidney disease with stage 1 through stage 4 chronic kidney disease, or unspecified chronic kidney disease: Secondary | ICD-10-CM | POA: Diagnosis not present

## 2019-03-16 DIAGNOSIS — J9811 Atelectasis: Secondary | ICD-10-CM | POA: Diagnosis not present

## 2019-03-16 DIAGNOSIS — R4182 Altered mental status, unspecified: Secondary | ICD-10-CM | POA: Diagnosis present

## 2019-03-16 DIAGNOSIS — I10 Essential (primary) hypertension: Secondary | ICD-10-CM | POA: Diagnosis not present

## 2019-03-16 LAB — COMPREHENSIVE METABOLIC PANEL
ALT: 22 U/L (ref 0–44)
AST: 20 U/L (ref 15–41)
Albumin: 4.4 g/dL (ref 3.5–5.0)
Alkaline Phosphatase: 198 U/L — ABNORMAL HIGH (ref 38–126)
Anion gap: 15 (ref 5–15)
BUN: 24 mg/dL — ABNORMAL HIGH (ref 8–23)
CO2: 22 mmol/L (ref 22–32)
Calcium: 9.8 mg/dL (ref 8.9–10.3)
Chloride: 94 mmol/L — ABNORMAL LOW (ref 98–111)
Creatinine, Ser: 1.04 mg/dL — ABNORMAL HIGH (ref 0.44–1.00)
GFR calc Af Amer: 58 mL/min — ABNORMAL LOW (ref >60)
GFR calc non Af Amer: 50 mL/min — ABNORMAL LOW (ref >60)
Glucose, Bld: 488 mg/dL — ABNORMAL HIGH (ref 70–99)
Potassium: 3.6 mmol/L (ref 3.5–5.1)
Sodium: 131 mmol/L — ABNORMAL LOW (ref 135–145)
Total Bilirubin: 0.7 mg/dL (ref 0.3–1.2)
Total Protein: 8.4 g/dL — ABNORMAL HIGH (ref 6.5–8.1)

## 2019-03-16 LAB — I-STAT CHEM 8, ED
BUN: 19 mg/dL (ref 8–23)
Calcium, Ion: 1.15 mmol/L (ref 1.15–1.40)
Chloride: 99 mmol/L (ref 98–111)
Creatinine, Ser: 0.8 mg/dL (ref 0.44–1.00)
Glucose, Bld: 393 mg/dL — ABNORMAL HIGH (ref 70–99)
HCT: 37 % (ref 36.0–46.0)
Hemoglobin: 12.6 g/dL (ref 12.0–15.0)
Potassium: 3 mmol/L — ABNORMAL LOW (ref 3.5–5.1)
Sodium: 136 mmol/L (ref 135–145)
TCO2: 22 mmol/L (ref 22–32)

## 2019-03-16 LAB — CBC WITH DIFFERENTIAL/PLATELET
Abs Immature Granulocytes: 0.06 10*3/uL (ref 0.00–0.07)
Basophils Absolute: 0.1 10*3/uL (ref 0.0–0.1)
Basophils Relative: 1 %
Eosinophils Absolute: 0.4 10*3/uL (ref 0.0–0.5)
Eosinophils Relative: 3 %
HCT: 40.4 % (ref 36.0–46.0)
Hemoglobin: 13.2 g/dL (ref 12.0–15.0)
Immature Granulocytes: 1 %
Lymphocytes Relative: 32 %
Lymphs Abs: 3.7 10*3/uL (ref 0.7–4.0)
MCH: 27.6 pg (ref 26.0–34.0)
MCHC: 32.7 g/dL (ref 30.0–36.0)
MCV: 84.3 fL (ref 80.0–100.0)
Monocytes Absolute: 0.9 10*3/uL (ref 0.1–1.0)
Monocytes Relative: 8 %
Neutro Abs: 6.5 10*3/uL (ref 1.7–7.7)
Neutrophils Relative %: 55 %
Platelets: 277 10*3/uL (ref 150–400)
RBC: 4.79 MIL/uL (ref 3.87–5.11)
RDW: 14.8 % (ref 11.5–15.5)
WBC: 11.6 10*3/uL — ABNORMAL HIGH (ref 4.0–10.5)
nRBC: 0 % (ref 0.0–0.2)

## 2019-03-16 LAB — URINALYSIS, ROUTINE W REFLEX MICROSCOPIC
Bacteria, UA: NONE SEEN
Bilirubin Urine: NEGATIVE
Glucose, UA: >=500 mg/dL — AB
Ketones, ur: 5 mg/dL — AB
Leukocytes,Ua: NEGATIVE
Nitrite: NEGATIVE
Protein, ur: 30 mg/dL — AB
Specific Gravity, Urine: 1.02 (ref 1.005–1.030)
pH: 6 (ref 5.0–8.0)

## 2019-03-16 LAB — BLOOD GAS, VENOUS
Acid-Base Excess: 1.5 mmol/L (ref 0.0–2.0)
Bicarbonate: 25.3 mmol/L (ref 20.0–28.0)
O2 Saturation: 81.4 %
Patient temperature: 98.6
pCO2, Ven: 38.7 mmHg — ABNORMAL LOW (ref 44.0–60.0)
pH, Ven: 7.431 — ABNORMAL HIGH (ref 7.250–7.430)
pO2, Ven: 47.3 mmHg — ABNORMAL HIGH (ref 32.0–45.0)

## 2019-03-16 LAB — CBG MONITORING, ED
Glucose-Capillary: 485 mg/dL — ABNORMAL HIGH (ref 70–99)
Glucose-Capillary: 329 mg/dL — ABNORMAL HIGH (ref 70–99)

## 2019-03-16 LAB — TROPONIN I (HIGH SENSITIVITY): Troponin I (High Sensitivity): 32 ng/L — ABNORMAL HIGH (ref <18)

## 2019-03-16 LAB — LIPASE, BLOOD: Lipase: 24 U/L (ref 11–51)

## 2019-03-16 MED ORDER — INSULIN ASPART 100 UNIT/ML ~~LOC~~ SOLN
10.0000 [IU] | Freq: Once | SUBCUTANEOUS | Status: AC
  Administered 2019-03-16: 10 [IU] via INTRAVENOUS
  Filled 2019-03-16: qty 0.1

## 2019-03-16 MED ORDER — AMLODIPINE BESYLATE 5 MG PO TABS
2.5000 mg | ORAL_TABLET | Freq: Once | ORAL | Status: AC
  Administered 2019-03-16: 2.5 mg via ORAL
  Filled 2019-03-16: qty 1

## 2019-03-16 MED ORDER — ONDANSETRON HCL 4 MG/2ML IJ SOLN
4.0000 mg | Freq: Once | INTRAMUSCULAR | Status: AC
  Administered 2019-03-16: 4 mg via INTRAVENOUS
  Filled 2019-03-16: qty 2

## 2019-03-16 MED ORDER — ONDANSETRON 4 MG PO TBDP: ORAL_TABLET | ORAL | 0 refills | Status: DC

## 2019-03-16 MED ORDER — SODIUM CHLORIDE 0.9 % IV BOLUS
1000.0000 mL | Freq: Once | INTRAVENOUS | Status: AC
  Administered 2019-03-16: 1000 mL via INTRAVENOUS

## 2019-03-16 NOTE — ED Provider Notes (Addendum)
[facility] DEPT Provider Note   CSN: [id number] Arrival date & time: 03/16/19  1926     History   Chief Complaint Chief Complaint  Patient presents with  . Abdominal Pain  . Hyperglycemia    HPI Casey Harrell is a 84 y.o. female hx of COPD, diabetes, hypertension, previous stroke here presenting with vomiting, abdominal pain, decreased mental status .  Patient is from [facility] .  She apparently has an aide that comes Monday through Friday but they are not there on the weekends.  She states that she does not remember if she has been taking her medicines or not.  She was noted to be slightly confused and has been vomiting.  Patient was noted to have a blood sugar of 500 per EMS.  Patient denies any fevers or chills or cough.     The history is provided by the patient and the EMS personnel.    Past Medical History:  Diagnosis Date  . ASTHMA 07/15/2008  . COPD (chronic obstructive pulmonary disease) ([place])   . DIABETES-TYPE 2 07/15/2008  . DIABETIC PERIPHERAL NEUROPATHY 09/02/2009  . GERD 07/15/2008  . HYPERTENSION 07/15/2008  . Normal cardiac stress test    low risk Nuc 2009, Feb 2011  . Paroxysmal atrial fibrillation (HCC)   . PVD (peripheral vascular disease) ([place]) 06/18/09   RCE  . Stroke ([place])   . Vertigo     Patient Active Problem List   Diagnosis Date Noted  . Labile blood pressure   . Labile blood glucose   . AKI (acute kidney injury) ([place])   . Diabetes mellitus type 2 in nonobese (HCC)   . PAF (paroxysmal atrial fibrillation) ([place])   . Reactive hypertension   . Right pontine CVA ([place]) 10/08/2018  . COPD (chronic obstructive pulmonary disease) ([place]) 10/03/2018  . 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 Left pontine cerebrovascular accident ([place]) 04/03/2017  . History of CVA with residual deficit   . Type 2 diabetes mellitus with peripheral neuropathy (HCC)   . Anemia 03/30/2017  . History of  stroke   . Chest pain 02/09/2016  . BPPV (benign paroxysmal positional vertigo) 08/25/2015  . HLD (hyperlipidemia) 08/25/2015  . At high risk for falls 07/01/2015  . Vertigo 06/21/2015  . Paroxysmal atrial fibrillation ([place]) 06/11/2015  . Cerebral infarction due to stenosis of left middle cerebral artery ([place]) 05/20/2015  . Intracranial vascular stenosis 05/20/2015  . Type 2 diabetes mellitus with circulatory disorder ([place]) 05/20/2015  . Type 2 diabetes, uncontrolled, with neuropathy ([place])   . Abdominal pain   . Partial small bowel obstruction ([place])   . Benign essential HTN   . Small bowel obstruction ([place]) 05/07/2015  . Hemiplegia, unspecified affecting right dominant side ([place]) 04/08/2015  . Embolic stroke involving posterior cerebral artery ([place]) 04/05/2015  . Orthostatic hypotension   . Hypertensive urgency 04/01/2015  . Fever in adult 04/01/2015  . Acute encephalopathy 04/01/2015  . Confusion   . CVA (cerebral infarction)   . Posterior circulation stroke ([place])   . Acute ischemic stroke ([place]) 03/31/2015  . Stroke ([place]) 03/31/2015  . CKD (chronic kidney disease) stage 3, GFR 30-59 ml/min 04/17/2014  . Gastroenteritis, non-infectious 04/11/2014  . Nausea vomiting and diarrhea 04/10/2014  . Hypokalemia 04/10/2014  . Dehydration 04/09/2014  . Bronchitis with airway obstruction ([place]) 02/03/2013  . Leukocytosis 02/03/2013  . Fever 02/03/2013  . Acute respiratory failure with hypoxia ([place]) 02/03/2013  . Depression 02/03/2013  .  Hyponatremia 02/03/2013  . Obesity (BMI 30-39.9) 01/10/2013  . PVD (peripheral vascular disease)- S/P RCE 06/18/09 12/13/2012  . Sleep apnea- C-pap intol 12/13/2012  . Low risk cardiac nuclear stress test- 2009 and Feb 2011 12/13/2012  . Mild persistent asthma 11/29/2011  . Chronic cough 06/05/2011  . Diabetic neuropathy (HCC) 09/02/2009  . Dyslipidemia 07/13/2009  . Hypothyroidism 11/12/2008  . Type 2 diabetes mellitus, uncontrolled (HCC) 07/15/2008   . Essential hypertension 07/15/2008  . GERD 07/15/2008  . DIVERTICULOSIS, COLON 07/15/2008    Past Surgical History:  Procedure Laterality Date  . ABDOMINAL HYSTERECTOMY  1971  . APPENDECTOMY  1971  . CAROTID ENDARTERECTOMY  06/18/09   RCE  . CESAREAN SECTION    . colectomy and colostomy    . FLEXIBLE SIGMOIDOSCOPY     diverticulitis  . FOOT SURGERY     right foot X 2  . LAPAROTOMY     X 3 with adhesions lysis  . TONSILLECTOMY    . TUBAL LIGATION       OB History   No obstetric history on file.      Home Medications    Prior to Admission medications   Medication Sig Start Date End Date Taking? Authorizing Provider  acetaminophen (TYLENOL) 325 MG tablet Take 325-650 mg by mouth every 6 (six) hours as needed (for pain).     [provider]  Albuterol Sulfate (PROAIR RESPICLICK) 108 (90 Base) MCG/ACT AEPB Inhale 2 puffs into the lungs every 4 (four) hours as needed. 10/21/18   Angiulli, Mcarthur Rossetti, PA-C  amLODipine (NORVASC) 2.5 MG tablet Take 3 tablets (7.5 mg total) by mouth daily. 10/21/18   Angiulli, Mcarthur Rossetti, PA-C  apixaban (ELIQUIS) 5 MG TABS tablet Take 1 tablet (5 mg total) by mouth 2 (two) times daily. 03/13/19   Burchette, Elberta Fortis, MD  atorvastatin (LIPITOR) 20 MG tablet TAKE 1 TABLET BY MOUTH EVERY DAY 02/18/19   Burchette, Elberta Fortis, MD  BIOTIN PO Take 1 tablet by mouth daily.    [provider]  CALCIUM PO Take 1 tablet by mouth daily.    [provider]  Cholecalciferol (VITAMIN D3) 2000 units TABS Take 2,000 Units by mouth daily. Reported on 04/26/2015 04/13/17   Angiulli, Mcarthur Rossetti, PA-C  Continuous Blood Gluc Receiver (FREESTYLE LIBRE 14 DAY READER) DEVI Apply 1 Device topically daily. 10/23/18   Burchette, Elberta Fortis, MD  Continuous Blood Gluc Sensor (FREESTYLE LIBRE 14 DAY SENSOR) MISC Inject 1 Device into the skin every 14 (fourteen) days. 10/23/18   Burchette, Elberta Fortis, MD  diclofenac sodium (VOLTAREN) 1 % GEL APPLY 2GRAMS TOPICALLY 4 TIMES  DAILY TO RIGHT ANKLE 10/21/18   Angiulli, Mcarthur Rossetti, PA-C  DULoxetine (CYMBALTA) 30 MG capsule Take 1 capsule (30 mg total) by mouth 2 (two) times daily. 10/21/18   Angiulli, Mcarthur Rossetti, PA-C  ELIQUIS 5 MG TABS tablet TAKE 1 TABLET BY MOUTH TWICE A DAY 01/20/19   Burchette, Elberta Fortis, MD  fluticasone (FLONASE) 50 MCG/ACT nasal spray Place 1 spray into both nostrils daily. 10/21/18   Angiulli, Mcarthur Rossetti, PA-C  glucose blood (ACCU-CHEK AVIVA PLUS) test strip Test twice daily.  Dx e11.9 07/11/17   Burchette, Elberta Fortis, MD  insulin aspart (NOVOLOG) 100 UNIT/ML injection Inject 7 Units into the skin 3 (three) times daily with meals. 10/21/18   Angiulli, Mcarthur Rossetti, PA-C  Insulin Detemir (LEVEMIR) 100 UNIT/ML Pen Inject 28 Units into the skin 2 (two) times daily. 10/21/18   Angiulli, Mcarthur Rossetti,  PA-C  Insulin Detemir (LEVEMIR) 100 UNIT/ML Pen Inject 28 Units into the skin 2 (two) times daily. 10/21/18   Angiulli, Mcarthur Rossetti, PA-C  Insulin Pen Needle (B-D ULTRAFINE III SHORT PEN) 31G X 8 MM MISC USE TO INJECT INSULIN 5 TIMES DAILY 12/16/18   Burchette, Elberta Fortis, MD  levothyroxine (SYNTHROID) 50 MCG tablet TAKE 1 TABLET BY MOUTH EVERY DAY BEFORE BREAKFAST 10/21/18   Angiulli, Mcarthur Rossetti, PA-C  pantoprazole (PROTONIX) 40 MG tablet Take 1 tablet (40 mg total) by mouth 2 (two) times daily. 10/21/18   Angiulli, Mcarthur Rossetti, PA-C  pregabalin (LYRICA) 150 MG capsule Take 1 capsule (150 mg total) by mouth daily. 10/21/18   Angiulli, Mcarthur Rossetti, PA-C    Family History Family History  Problem Relation Age of Onset  . Diabetes Mother   . Arthritis Mother   . Heart disease Mother   . Heart disease Father   . Heart disease Sister   . Heart disease Brother   . Cancer Brother 60       lung cancer  . Celiac disease Sister   . Stroke Maternal Grandfather   . Cancer Paternal Grandfather        lung  . Heart disease Sister   . Asthma Sister     Social History Social History   Tobacco Use  . Smoking status: Never Smoker  . Smokeless  tobacco: Never Used  Substance Use Topics  . Alcohol use: No  . Drug use: No     Allergies   Doxycycline hyclate, Morphine sulfate, Iohexol, and Moxifloxacin   Review of Systems Review of Systems  Gastrointestinal: Positive for abdominal pain and vomiting.  All other systems reviewed and are negative.    Physical Exam Updated Vital Signs BP (!) 152/87 (BP Location: Left Arm)   Pulse 91   Temp 97.7 F (36.5 C) (Oral)   Resp 18   SpO2 99%   Physical Exam Vitals signs and nursing note reviewed.  Constitutional:      Comments: Slightly uncomfortable, vomiting   HENT:     Head: Normocephalic.  Eyes:     Extraocular Movements: Extraocular movements intact.  Cardiovascular:     Rate and Rhythm: Normal rate and regular rhythm.  Pulmonary:     Effort: Pulmonary effort is normal.     Breath sounds: Normal breath sounds.  Abdominal:     General: Abdomen is flat. Bowel sounds are normal.     Palpations: Abdomen is soft.     Comments: Mild epigastric tenderness   Skin:    General: Skin is warm.  Neurological:     General: No focal deficit present.     Mental Status: She is alert.  Psychiatric:        Mood and Affect: Mood normal.        Behavior: Behavior normal.      ED Treatments / Results  Labs (all labs ordered are listed, but only abnormal results are displayed) Labs Reviewed  CBC WITH DIFFERENTIAL/PLATELET - Abnormal; Notable for the following components:      Result Value   WBC 11.6 (*)    All other components within normal limits  COMPREHENSIVE METABOLIC PANEL - Abnormal; Notable for the following components:   Sodium 131 (*)    Chloride 94 (*)    Glucose, Bld 488 (*)    BUN 24 (*)    Creatinine, Ser 1.04 (*)    Total Protein 8.4 (*)    Alkaline Phosphatase 198 (*)  GFR calc non Af Amer 50 (*)    GFR calc Af Amer 58 (*)    All other components within normal limits  BLOOD GAS, VENOUS - Abnormal; Notable for the following components:   pH, Ven  7.431 (*)    pCO2, Ven 38.7 (*)    pO2, Ven 47.3 (*)    All other components within normal limits  URINALYSIS, ROUTINE W REFLEX MICROSCOPIC - Abnormal; Notable for the following components:   Color, Urine STRAW (*)    Glucose, UA >=500 (*)    Hgb urine dipstick SMALL (*)    Ketones, ur 5 (*)    Protein, ur 30 (*)    All other components within normal limits  CBG MONITORING, ED - Abnormal; Notable for the following components:   Glucose-Capillary 485 (*)    All other components within normal limits  TROPONIN I (HIGH SENSITIVITY) - Abnormal; Notable for the following components:   Troponin I (High Sensitivity) 32 (*)    All other components within normal limits  URINE CULTURE  SARS CORONAVIRUS 2 (TAT 6-24 HRS)  LIPASE, BLOOD  I-STAT CHEM 8, ED  CBG MONITORING, ED  TROPONIN I (HIGH SENSITIVITY)    EKG None  Radiology Ct Head Wo Contrast  Result Date: 03/16/2019 CLINICAL DATA:  Altered level consciousness EXAM: CT HEAD WITHOUT CONTRAST TECHNIQUE: Contiguous axial images were obtained from the base of the skull through the vertex without intravenous contrast. COMPARISON:  October 08, 2018 FINDINGS: Brain: No evidence of acute territorial infarction, hemorrhage, hydrocephalus,extra-axial collection or mass lesion/mass effect. Again noted is area of encephalomalacia involving the left parieto-occipital lobe as on the prior exam. There is dilatation the ventricles and sulci consistent with age-related atrophy. Low-attenuation changes in the deep white matter consistent with small vessel ischemia. Vascular: No hyperdense vessel or unexpected calcification. Skull: The skull is intact. No fracture or focal lesion identified. Sinuses/Orbits: Small mucous retention cyst seen within the left maxillary sinus. The orbits and globes intact. Other: None IMPRESSION: 1. No acute intracranial abnormality. 2. Findings consistent with age related atrophy and chronic small vessel ischemia 3. Area of  encephalomalacia involving the left parietooccipital lobe. Electronically Signed   By: Jonna ClarkBindu  Avutu M.D.   On: 03/16/2019 20:55   Dg Chest Portable 1 View  Result Date: 03/16/2019 CLINICAL DATA:  Vomiting, medication noncompliance EXAM: PORTABLE CHEST 1 VIEW COMPARISON:  Radiograph 03/30/2017, CT chest 11/09/2017 FINDINGS: Chronic elevation the right hemidiaphragm with bibasilar atelectatic changes. There is some mild patchy right perihilar opacity. No pneumothorax or effusion. Cardiomediastinal contours are unchanged from prior. Degenerative changes are present in the imaged spine and shoulders. IMPRESSION: 1. Chronic elevation of the right hemidiaphragm with bibasilar atelectatic changes, similar to prior study. 2. Right perihilar opacity could reflect vascular crowding/atelectasis or early consolidation. Electronically Signed   By: Kreg ShropshirePrice  DeHay M.D.   On: 03/16/2019 20:44   Ct Renal Stone Study  Result Date: 03/16/2019 CLINICAL DATA:  Flank pain and history of stones EXAM: CT ABDOMEN AND PELVIS WITHOUT CONTRAST TECHNIQUE: Multidetector CT imaging of the abdomen and pelvis was performed following the standard protocol without IV contrast. COMPARISON:  11/09/2016 FINDINGS: Lower chest: Calcified granulomas are noted. No focal infiltrate or sizable effusion is seen. Hepatobiliary: No focal liver abnormality is seen. No gallstones, gallbladder wall thickening, or biliary dilatation. Pancreas: Unremarkable. No pancreatic ductal dilatation or surrounding inflammatory changes. Spleen: Normal in size without focal abnormality. Adrenals/Urinary Tract: Adrenal glands are within normal limits. Kidneys are well visualized bilaterally.  No renal calculi or obstructive changes are seen. The bladder is partially distended. Stomach/Bowel: Scattered diverticular changes noted without evidence of diverticulitis. No other focal colonic abnormality is seen. Appendix has been surgically removed. No small bowel abnormality is  noted. Postsurgical changes in the mid small bowel are again noted and stable. Vascular/Lymphatic: Aortic atherosclerosis. No enlarged abdominal or pelvic lymph nodes. Reproductive: Status post hysterectomy. No adnexal masses. Other: No abdominal wall hernia or abnormality. No abdominopelvic ascites. Musculoskeletal: No acute or significant osseous findings. IMPRESSION: Diverticulosis without diverticulitis. Prior granulomatous disease. No acute abnormality noted. Electronically Signed   By: Alcide Clever M.D.   On: 03/16/2019 20:59    Procedures Procedures (including critical care time)  Medications Ordered in ED Medications  sodium chloride 0.9 % bolus 1,000 mL (1,000 mLs Intravenous New Bag/Given 03/16/19 2020)  ondansetron (ZOFRAN) injection 4 mg (4 mg Intravenous Given 03/16/19 2020)  insulin aspart (novoLOG) injection 10 Units (10 Units Intravenous Given 03/16/19 2020)     Initial Impression / Assessment and Plan / ED Course  I have reviewed the triage vital signs and the nursing notes.  Pertinent labs & imaging results that were available during my care of the patient were reviewed by me and considered in my medical decision making (see chart for details).       Casey Harrell is a 84 y.o. female history of stroke, diabetes with medication noncompliance here presenting with weakness, vomiting, abdominal pain .  Consider SBO versus head bleed versus DKA versus uncontrolled diabetes. Will get labs, CT head, CT ab/pel, UA. Will hydrate and give insulin.   9:51 PM Glucose was 480 on chemistry. AG is 15. PH is normal. CT head/ CT ab/pel unremarkable. I don't think she is in DKA. Glucose down to 320 after IVF and insulin. She is no longer vomiting. She is likely hyperglycemic from not taking her insulin. Told her to take it as prescribed. Will give zofran prn nausea.   Final Clinical Impressions(s) / ED Diagnoses   Final diagnoses:  None    ED Discharge Orders    None       Charlynne Pander, MD 03/16/19 2153    Charlynne Pander, MD 03/16/19 2211

## 2019-03-16 NOTE — ED Notes (Signed)
[facility] called for transport. [facility] called and notified of patient's return. 

## 2019-03-16 NOTE — Discharge Instructions (Signed)
Take your insulin as prescribed.   Stay hydrated   Take zofran for nausea   See your doctor  Return to ER if you have worse abdominal pain, vomiting, fever, glucose > 600 or less than 60.

## 2019-03-16 NOTE — ED Triage Notes (Signed)
Pt arrived via [facility] from [facility] and hyperglycemia. Staff reports pt has not been compliant w/meds. Staff also reports pt has been "increasingly confused".  Pt had one emesis episode upon arrival no blood noted.  Hx Diabetes, Stroke X3, GERD, on blood thinners, metformin, insulin.  Per EMS A/oX4 no code status on file

## 2019-03-16 NOTE — ED Notes (Signed)
Patient found naked, walking around the room. She had an episode of incontinence and was given a fresh gown and a new brief. Patient resting in bed at this time and re-educated on the call-light.

## 2019-03-17 DIAGNOSIS — R279 Unspecified lack of coordination: Secondary | ICD-10-CM | POA: Diagnosis not present

## 2019-03-17 DIAGNOSIS — R5381 Other malaise: Secondary | ICD-10-CM | POA: Diagnosis not present

## 2019-03-17 DIAGNOSIS — Z743 Need for continuous supervision: Secondary | ICD-10-CM | POA: Diagnosis not present

## 2019-03-17 DIAGNOSIS — E1165 Type 2 diabetes mellitus with hyperglycemia: Secondary | ICD-10-CM | POA: Diagnosis not present

## 2019-03-17 NOTE — Telephone Encounter (Signed)
Attempted to reach pt or son, no answer. Left vm to call back. Provider would like to come in office since due for labs and go over medication. If not comfortable doxy or telephone visit

## 2019-03-18 NOTE — Telephone Encounter (Signed)
Someone has scheduled pt for an appt on 03/25/19

## 2019-03-19 LAB — URINE CULTURE: Culture: >=100000 — AB

## 2019-03-20 ENCOUNTER — Telehealth: Payer: Self-pay | Admitting: *Deleted

## 2019-03-20 NOTE — Telephone Encounter (Signed)
Post ED Visit - Positive Culture Follow-up: Unsuccessful Patient Follow-up  Culture assessed and recommendations reviewed by:  []  Elenor Quinones, Pharm.D. []  Heide Guile, Pharm.D., BCPS AQ-ID []  Parks Neptune, Pharm.D., BCPS []  Alycia Rossetti, Pharm.D., BCPS []  [place], [place].D., BCPS, AAHIVP []  Legrand Como, Pharm.D., BCPS, AAHIVP []  Wynell Balloon, PharmD []  Vincenza Hews, PharmD, BCPS Reuel Boom, PharmD  Positive urine culture  []  Patient discharged without antimicrobial prescription and treatment is now indicated []  Organism is resistant to prescribed ED discharge antimicrobial []  Patient with positive blood cultures  Attempted symptom check with no answer, plan is if symptomatic start Keflex 500mg  PO BID x 5 days, Domenic Moras , PA  Unable to contact patient after 3 attempts, letter will be sent to address on file  Ardeen Fillers 03/20/2019, 11:02 AM

## 2019-03-20 NOTE — Progress Notes (Signed)
ED Antimicrobial Stewardship Positive Culture Follow Up   Casey Harrell is an 84 y.o. female who presented to [facility] on 03/16/2019 with a chief complaint of  Chief Complaint  Patient presents with  . Abdominal Pain  . Hyperglycemia    Recent Results (from the past 720 hour(s))  Urine culture     Status: Abnormal   Collection Time: 03/16/19  7:53 PM   Specimen: Urine, Random  Result Value Ref Range Status   Specimen Description   Final    URINE, RANDOM Performed at [facility] [address], [place], [place] [address]    Special Requests   Final    NONE Performed at [facility], [place] [address] [place], [place] [address]    Culture >=100,000 COLONIES/mL KLEBSIELLA PNEUMONIAE (A)  Final   Report Status 03/19/2019 FINAL  Final   Organism ID, Bacteria KLEBSIELLA PNEUMONIAE (A)  Final      Susceptibility   Klebsiella pneumoniae - MIC*    AMPICILLIN RESISTANT Resistant     CEFAZOLIN <=4 SENSITIVE Sensitive     CEFTRIAXONE <=1 SENSITIVE Sensitive     CIPROFLOXACIN <=0.25 SENSITIVE Sensitive     GENTAMICIN <=1 SENSITIVE Sensitive     IMIPENEM <=0.25 SENSITIVE Sensitive     NITROFURANTOIN 32 SENSITIVE Sensitive     TRIMETH/SULFA <=20 SENSITIVE Sensitive     AMPICILLIN/SULBACTAM 4 SENSITIVE Sensitive     PIP/TAZO <=4 SENSITIVE Sensitive     Extended ESBL NEGATIVE Sensitive     * >=100,000 COLONIES/mL KLEBSIELLA PNEUMONIAE   UTI felt unlikely, but d/t numerous comorbidities, EDP chose to have patient contacted - If still symptomatic start Keflex 500 mg BID x 5 days (#10)  ED Provider: Domenic Moras, PA-C   Zarek Relph A 03/20/2019, 10:53 AM Clinical Pharmacist [phone]

## 2019-03-25 ENCOUNTER — Encounter: Payer: Self-pay | Admitting: Family Medicine

## 2019-03-25 ENCOUNTER — Other Ambulatory Visit: Payer: Self-pay

## 2019-03-25 ENCOUNTER — Ambulatory Visit (INDEPENDENT_AMBULATORY_CARE_PROVIDER_SITE_OTHER): Payer: Medicare Other | Admitting: Family Medicine

## 2019-03-25 VITALS — BP 124/80 | HR 72 | Temp 97.0°F | Ht 59.75 in | Wt 139.2 lb

## 2019-03-25 DIAGNOSIS — E1140 Type 2 diabetes mellitus with diabetic neuropathy, unspecified: Secondary | ICD-10-CM

## 2019-03-25 DIAGNOSIS — IMO0002 Reserved for concepts with insufficient information to code with codable children: Secondary | ICD-10-CM

## 2019-03-25 DIAGNOSIS — I480 Paroxysmal atrial fibrillation: Secondary | ICD-10-CM

## 2019-03-25 DIAGNOSIS — E039 Hypothyroidism, unspecified: Secondary | ICD-10-CM | POA: Diagnosis not present

## 2019-03-25 DIAGNOSIS — G629 Polyneuropathy, unspecified: Secondary | ICD-10-CM | POA: Diagnosis not present

## 2019-03-25 DIAGNOSIS — I10 Essential (primary) hypertension: Secondary | ICD-10-CM | POA: Diagnosis not present

## 2019-03-25 DIAGNOSIS — E1142 Type 2 diabetes mellitus with diabetic polyneuropathy: Secondary | ICD-10-CM

## 2019-03-25 DIAGNOSIS — E1165 Type 2 diabetes mellitus with hyperglycemia: Secondary | ICD-10-CM

## 2019-03-25 MED ORDER — INSULIN LISPRO (1 UNIT DIAL) 100 UNIT/ML (KWIKPEN): PEN_INJECTOR | SUBCUTANEOUS | 11 refills | Status: DC

## 2019-03-25 MED ORDER — APIXABAN 5 MG PO TABS: 5.0000 mg | ORAL_TABLET | Freq: Two times a day (BID) | ORAL | 3 refills | Status: DC

## 2019-03-25 NOTE — Progress Notes (Signed)
Subjective:     Patient ID: Casey Harrell, female   DOB: 06-14-1934, 84 y.o.   MRN: [id number]  HPI Ms. Disla has multiple chronic problems including history of multiple strokes, hypertension, peripheral vascular disease, type 2 diabetes, COPD, history of small bowel obstruction, GERD, hypothyroidism, chronic kidney disease, hyperlipidemia.  She is currently residing at [facility].  She had recent ER visit on the eighth for hyperglycemia.  She had presented with some vomiting, abdominal pain, decreased mental alertness.  Her blood sugar was noted to be 500 per EMS.  She received IV fluids and 10 units of NovoLog and blood sugar came down to 300 range.  She had CT abdomen and pelvis as well as CT head with no acute abnormalities.  We discussed several issues as follows  Main issue is poorly controlled diabetes.  Her aide states that she has had fasting blood sugars over 300.  She is currently on Levemir 40 units twice daily and apparently taking sliding scale Humalog twice daily.  We do not have her sliding scale.  She is not taking any oral medications at this time.  Her aide states that they do not really have per se a specific dietary plan and she apparently is getting desserts daily along with things like orange juice.  She does not drink any regular sodas.  Very high starch diet.  Hypothyroidism.  She is due for repeat TSH.  There has been some concern for cognitive decline over time.  She has had borderline low B12 in the past and does not take any supplements at this time.  She has history of some depression symptoms.  It sounds like her symptoms of crying and depression are very intermittent.  Her husband died over a year ago but she is adapting fairly well.  She is on Cymbalta 30 mg twice daily and takes this regularly.  She is on Eliquis for history of atrial fibrillation and needing refills.  Past Medical History:  Diagnosis Date  . ASTHMA 07/15/2008  . COPD (chronic  obstructive pulmonary disease) ([place])   . DIABETES-TYPE 2 07/15/2008  . DIABETIC PERIPHERAL NEUROPATHY 09/02/2009  . GERD 07/15/2008  . HYPERTENSION 07/15/2008  . Normal cardiac stress test    low risk Nuc 2009, Feb 2011  . Paroxysmal atrial fibrillation (H[facility])   . PVD (peripheral vascular disease) ([place]) 06/18/09   RCE  . Stroke ([place])   . Vertigo    Past Surgical History:  Procedure Laterality Date  . ABDOMINAL HYSTERECTOMY  1971  . APPENDECTOMY  1971  . CAROTID ENDARTERECTOMY  06/18/09   RCE  . CESAREAN SECTION    . colectomy and colostomy    . FLEXIBLE SIGMOIDOSCOPY     diverticulitis  . FOOT SURGERY     right foot X 2  . LAPAROTOMY     X 3 with adhesions lysis  . TONSILLECTOMY    . TUBAL LIGATION      reports that she has never smoked. She has never used smokeless tobacco. She reports that she does not drink alcohol or use drugs. family history includes Arthritis in her mother; Asthma in her sister; Cancer in her paternal grandfather; Cancer (age of onset: 58) in her brother; Celiac disease in her sister; Diabetes in her mother; Heart disease in her brother, father, mother, sister, and sister; Stroke in her maternal grandfather. Allergies  Allergen Reactions  . Doxycycline Hyclate Nausea And Vomiting  . Morphine Sulfate Other (See Comments)  Causes hallucinations  . Iohexol Hives    Patient stated in 2010 this caused HIVES   . Moxifloxacin Other (See Comments)    Reaction not readily recalled??     Review of Systems  Constitutional: Negative for appetite change, fatigue and unexpected weight change.  Eyes: Negative for visual disturbance.  Respiratory: Negative for cough, chest tightness, shortness of breath and wheezing.   Cardiovascular: Negative for chest pain, palpitations and leg swelling.  Gastrointestinal: Negative for abdominal pain.  Endocrine: Negative for polydipsia and polyuria.  Genitourinary: Negative for dysuria.  Neurological: Negative for dizziness,  seizures, syncope, weakness, light-headedness and headaches.  Psychiatric/Behavioral: Negative for confusion.       Objective:   Physical Exam Vitals signs reviewed.  Constitutional:      Appearance: Normal appearance.  Cardiovascular:     Rate and Rhythm: Normal rate.  Pulmonary:     Comments: Slightly diminished breath sounds in both bases.  No wheezes or rales. Musculoskeletal:     Right lower leg: No edema.     Left lower leg: No edema.  Neurological:     General: No focal deficit present.     Mental Status: She is alert.     Comments: She is oriented to day of week and season but not year or date.        Assessment:     #1 type 2 diabetes with history of poor control and poor dietary compliance  #2 hypertension stable and at goal  #3 chronic atrial fibrillation history on Eliquis  #4 hypothyroidism.  Due for repeat TSH  #5 cognitive decline probably related to multiple strokes    Plan:     -Flu vaccine already given -Refill Eliquis -Refill Humalog -Check further labs with A1c, TSH, B12.  Suspect her A1c will be extremely high -Add third time of day for sliding scale Humalog at lunch.  We have also asked that they get a copy of her current sliding scale so we can see about any needs to adjust.  I feel like her poor diet is contributing predominantly to poor glycemic control.  We have written a note to the folks at [facility] to try to restrict her carbohydrate intake -Recommend office follow-up in 3 months to reassess  [name] MD [facility]

## 2019-03-25 NOTE — Patient Instructions (Signed)

## 2019-03-26 LAB — VITAMIN B12: Vitamin B-12: 343 pg/mL (ref 211–911)

## 2019-03-26 LAB — HEMOGLOBIN A1C: Hgb A1c MFr Bld: 12.8 % — ABNORMAL HIGH (ref 4.6–6.5)

## 2019-03-26 LAB — TSH: TSH: 2.04 u[IU]/mL (ref 0.35–4.50)

## 2019-03-31 NOTE — Telephone Encounter (Signed)
No longer taking

## 2019-03-31 NOTE — Telephone Encounter (Signed)
Please advise if patient is to continue this medication. Not able to get an answer from son if patient is still taking.

## 2019-04-16 ENCOUNTER — Other Ambulatory Visit: Payer: Self-pay | Admitting: Family Medicine

## 2019-04-17 ENCOUNTER — Other Ambulatory Visit: Payer: Self-pay | Admitting: Family Medicine

## 2019-04-18 ENCOUNTER — Telehealth: Payer: Self-pay | Admitting: *Deleted

## 2019-04-18 ENCOUNTER — Other Ambulatory Visit: Payer: Self-pay | Admitting: Family Medicine

## 2019-04-18 NOTE — Telephone Encounter (Signed)
Copied from [place] [id number]. Topic: General - Other >> Apr 18, 2019 12:17 PM Keene Breath wrote: Reason for CRM: Patient's daughter called to ask that the nurse call her because she had a few questions regarding patient's condition.  CB# [phone]

## 2019-04-18 NOTE — Telephone Encounter (Signed)
Called daughter and she stated that her mother is getting more forgetful and Casey Harrell has some paperwork of FMLA where she stayed with her mother during the time she was in the hospital back on 03/16/19 and her and her brother had been taking care of her mother. Daughter is worried about loosing her job because she had time off to take care of her mother and would like the completed paperwork faxed back to [facility]. Paperwork should be faxed to [place] around Monday or Tuesday of next week.

## 2019-04-21 ENCOUNTER — Inpatient Hospital Stay (HOSPITAL_COMMUNITY)
Admission: EM | Admit: 2019-04-21 | Discharge: 2019-04-24 | DRG: 065 | Disposition: A | Discharge status: Skilled Nursing Facility | Payer: Medicare Other | Attending: Internal Medicine | Admitting: Internal Medicine

## 2019-04-21 ENCOUNTER — Emergency Department (HOSPITAL_COMMUNITY): Payer: Medicare Other

## 2019-04-21 ENCOUNTER — Encounter (HOSPITAL_COMMUNITY): Payer: Self-pay | Admitting: Emergency Medicine

## 2019-04-21 DIAGNOSIS — G8191 Hemiplegia, unspecified affecting right dominant side: Secondary | ICD-10-CM | POA: Diagnosis not present

## 2019-04-21 DIAGNOSIS — R0602 Shortness of breath: Secondary | ICD-10-CM | POA: Diagnosis not present

## 2019-04-21 DIAGNOSIS — E876 Hypokalemia: Secondary | ICD-10-CM | POA: Diagnosis present

## 2019-04-21 DIAGNOSIS — E860 Dehydration: Secondary | ICD-10-CM | POA: Diagnosis present

## 2019-04-21 DIAGNOSIS — Z833 Family history of diabetes mellitus: Secondary | ICD-10-CM

## 2019-04-21 DIAGNOSIS — Z885 Allergy status to narcotic agent status: Secondary | ICD-10-CM

## 2019-04-21 DIAGNOSIS — R29810 Facial weakness: Secondary | ICD-10-CM | POA: Diagnosis not present

## 2019-04-21 DIAGNOSIS — M2550 Pain in unspecified joint: Secondary | ICD-10-CM | POA: Diagnosis not present

## 2019-04-21 DIAGNOSIS — R29818 Other symptoms and signs involving the nervous system: Secondary | ICD-10-CM | POA: Diagnosis not present

## 2019-04-21 DIAGNOSIS — R5381 Other malaise: Secondary | ICD-10-CM | POA: Diagnosis not present

## 2019-04-21 DIAGNOSIS — E1121 Type 2 diabetes mellitus with diabetic nephropathy: Secondary | ICD-10-CM | POA: Diagnosis not present

## 2019-04-21 DIAGNOSIS — E1151 Type 2 diabetes mellitus with diabetic peripheral angiopathy without gangrene: Secondary | ICD-10-CM | POA: Diagnosis not present

## 2019-04-21 DIAGNOSIS — I480 Paroxysmal atrial fibrillation: Secondary | ICD-10-CM | POA: Diagnosis not present

## 2019-04-21 DIAGNOSIS — N1832 Chronic kidney disease, stage 3b: Secondary | ICD-10-CM | POA: Diagnosis not present

## 2019-04-21 DIAGNOSIS — Z743 Need for continuous supervision: Secondary | ICD-10-CM | POA: Diagnosis not present

## 2019-04-21 DIAGNOSIS — Z7901 Long term (current) use of anticoagulants: Secondary | ICD-10-CM

## 2019-04-21 DIAGNOSIS — E1165 Type 2 diabetes mellitus with hyperglycemia: Secondary | ICD-10-CM | POA: Diagnosis present

## 2019-04-21 DIAGNOSIS — Z79890 Hormone replacement therapy (postmenopausal): Secondary | ICD-10-CM

## 2019-04-21 DIAGNOSIS — Z933 Colostomy status: Secondary | ICD-10-CM | POA: Diagnosis not present

## 2019-04-21 DIAGNOSIS — Z7401 Bed confinement status: Secondary | ICD-10-CM | POA: Diagnosis not present

## 2019-04-21 DIAGNOSIS — Z79899 Other long term (current) drug therapy: Secondary | ICD-10-CM

## 2019-04-21 DIAGNOSIS — E871 Hypo-osmolality and hyponatremia: Secondary | ICD-10-CM | POA: Diagnosis present

## 2019-04-21 DIAGNOSIS — K5900 Constipation, unspecified: Secondary | ICD-10-CM | POA: Diagnosis not present

## 2019-04-21 DIAGNOSIS — Z03818 Encounter for observation for suspected exposure to other biological agents ruled out: Secondary | ICD-10-CM | POA: Diagnosis not present

## 2019-04-21 DIAGNOSIS — D72829 Elevated white blood cell count, unspecified: Secondary | ICD-10-CM | POA: Diagnosis present

## 2019-04-21 DIAGNOSIS — I10 Essential (primary) hypertension: Secondary | ICD-10-CM | POA: Diagnosis not present

## 2019-04-21 DIAGNOSIS — R062 Wheezing: Secondary | ICD-10-CM | POA: Diagnosis not present

## 2019-04-21 DIAGNOSIS — R4701 Aphasia: Secondary | ICD-10-CM | POA: Diagnosis present

## 2019-04-21 DIAGNOSIS — Z794 Long term (current) use of insulin: Secondary | ICD-10-CM | POA: Diagnosis not present

## 2019-04-21 DIAGNOSIS — E785 Hyperlipidemia, unspecified: Secondary | ICD-10-CM | POA: Diagnosis present

## 2019-04-21 DIAGNOSIS — G8929 Other chronic pain: Secondary | ICD-10-CM | POA: Diagnosis not present

## 2019-04-21 DIAGNOSIS — N179 Acute kidney failure, unspecified: Secondary | ICD-10-CM | POA: Diagnosis not present

## 2019-04-21 DIAGNOSIS — K219 Gastro-esophageal reflux disease without esophagitis: Secondary | ICD-10-CM | POA: Diagnosis not present

## 2019-04-21 DIAGNOSIS — I129 Hypertensive chronic kidney disease with stage 1 through stage 4 chronic kidney disease, or unspecified chronic kidney disease: Secondary | ICD-10-CM | POA: Diagnosis present

## 2019-04-21 DIAGNOSIS — E039 Hypothyroidism, unspecified: Secondary | ICD-10-CM | POA: Diagnosis not present

## 2019-04-21 DIAGNOSIS — Z8673 Personal history of transient ischemic attack (TIA), and cerebral infarction without residual deficits: Secondary | ICD-10-CM

## 2019-04-21 DIAGNOSIS — E559 Vitamin D deficiency, unspecified: Secondary | ICD-10-CM | POA: Diagnosis not present

## 2019-04-21 DIAGNOSIS — I639 Cerebral infarction, unspecified: Secondary | ICD-10-CM | POA: Diagnosis present

## 2019-04-21 DIAGNOSIS — J449 Chronic obstructive pulmonary disease, unspecified: Secondary | ICD-10-CM | POA: Diagnosis not present

## 2019-04-21 DIAGNOSIS — E1142 Type 2 diabetes mellitus with diabetic polyneuropathy: Secondary | ICD-10-CM | POA: Diagnosis present

## 2019-04-21 DIAGNOSIS — R29706 NIHSS score 6: Secondary | ICD-10-CM | POA: Diagnosis not present

## 2019-04-21 DIAGNOSIS — G459 Transient cerebral ischemic attack, unspecified: Secondary | ICD-10-CM | POA: Diagnosis not present

## 2019-04-21 DIAGNOSIS — E1122 Type 2 diabetes mellitus with diabetic chronic kidney disease: Secondary | ICD-10-CM | POA: Diagnosis not present

## 2019-04-21 DIAGNOSIS — Z823 Family history of stroke: Secondary | ICD-10-CM

## 2019-04-21 DIAGNOSIS — Z91041 Radiographic dye allergy status: Secondary | ICD-10-CM

## 2019-04-21 DIAGNOSIS — Z8249 Family history of ischemic heart disease and other diseases of the circulatory system: Secondary | ICD-10-CM

## 2019-04-21 DIAGNOSIS — Z825 Family history of asthma and other chronic lower respiratory diseases: Secondary | ICD-10-CM

## 2019-04-21 DIAGNOSIS — E119 Type 2 diabetes mellitus without complications: Secondary | ICD-10-CM

## 2019-04-21 DIAGNOSIS — Z20828 Contact with and (suspected) exposure to other viral communicable diseases: Secondary | ICD-10-CM | POA: Diagnosis present

## 2019-04-21 DIAGNOSIS — I63512 Cerebral infarction due to unspecified occlusion or stenosis of left middle cerebral artery: Principal | ICD-10-CM | POA: Diagnosis present

## 2019-04-21 DIAGNOSIS — R1310 Dysphagia, unspecified: Secondary | ICD-10-CM | POA: Diagnosis not present

## 2019-04-21 DIAGNOSIS — Z9049 Acquired absence of other specified parts of digestive tract: Secondary | ICD-10-CM

## 2019-04-21 DIAGNOSIS — M6281 Muscle weakness (generalized): Secondary | ICD-10-CM | POA: Diagnosis not present

## 2019-04-21 DIAGNOSIS — Z881 Allergy status to other antibiotic agents status: Secondary | ICD-10-CM

## 2019-04-21 DIAGNOSIS — R404 Transient alteration of awareness: Secondary | ICD-10-CM | POA: Diagnosis not present

## 2019-04-21 DIAGNOSIS — R402411 Glasgow coma scale score 13-15, in the field [EMT or ambulance]: Secondary | ICD-10-CM | POA: Diagnosis not present

## 2019-04-21 LAB — I-STAT CHEM 8, ED
BUN: 23 mg/dL (ref 8–23)
Calcium, Ion: 1.15 mmol/L (ref 1.15–1.40)
Chloride: 94 mmol/L — ABNORMAL LOW (ref 98–111)
Creatinine, Ser: 1.2 mg/dL — ABNORMAL HIGH (ref 0.44–1.00)
Glucose, Bld: 313 mg/dL — ABNORMAL HIGH (ref 70–99)
HCT: 48 % — ABNORMAL HIGH (ref 36.0–46.0)
Hemoglobin: 16.3 g/dL — ABNORMAL HIGH (ref 12.0–15.0)
Potassium: 3.6 mmol/L (ref 3.5–5.1)
Sodium: 131 mmol/L — ABNORMAL LOW (ref 135–145)
TCO2: 23 mmol/L (ref 22–32)

## 2019-04-21 LAB — CBC
HCT: 44.1 % (ref 36.0–46.0)
Hemoglobin: 14.4 g/dL (ref 12.0–15.0)
MCH: 27.2 pg (ref 26.0–34.0)
MCHC: 32.7 g/dL (ref 30.0–36.0)
MCV: 83.4 fL (ref 80.0–100.0)
Platelets: 352 10*3/uL (ref 150–400)
RBC: 5.29 MIL/uL — ABNORMAL HIGH (ref 3.87–5.11)
RDW: 14.6 % (ref 11.5–15.5)
WBC: 14 10*3/uL — ABNORMAL HIGH (ref 4.0–10.5)
nRBC: 0 % (ref 0.0–0.2)

## 2019-04-21 LAB — SARS CORONAVIRUS 2 (TAT 6-24 HRS): SARS Coronavirus 2: NEGATIVE

## 2019-04-21 LAB — GLUCOSE, CAPILLARY: Glucose-Capillary: 299 mg/dL — ABNORMAL HIGH (ref 70–99)

## 2019-04-21 LAB — COMPREHENSIVE METABOLIC PANEL
ALT: 17 U/L (ref 0–44)
AST: 23 U/L (ref 15–41)
Albumin: 3.8 g/dL (ref 3.5–5.0)
Alkaline Phosphatase: 116 U/L (ref 38–126)
Anion gap: 17 — ABNORMAL HIGH (ref 5–15)
BUN: 20 mg/dL (ref 8–23)
CO2: 21 mmol/L — ABNORMAL LOW (ref 22–32)
Calcium: 9.8 mg/dL (ref 8.9–10.3)
Chloride: 93 mmol/L — ABNORMAL LOW (ref 98–111)
Creatinine, Ser: 1.69 mg/dL — ABNORMAL HIGH (ref 0.44–1.00)
GFR calc Af Amer: 32 mL/min — ABNORMAL LOW (ref >60)
GFR calc non Af Amer: 28 mL/min — ABNORMAL LOW (ref >60)
Glucose, Bld: 321 mg/dL — ABNORMAL HIGH (ref 70–99)
Potassium: 3.7 mmol/L (ref 3.5–5.1)
Sodium: 131 mmol/L — ABNORMAL LOW (ref 135–145)
Total Bilirubin: 0.6 mg/dL (ref 0.3–1.2)
Total Protein: 7.5 g/dL (ref 6.5–8.1)

## 2019-04-21 LAB — PROTIME-INR
INR: 1.3 — ABNORMAL HIGH (ref 0.8–1.2)
Prothrombin Time: 16.5 seconds — ABNORMAL HIGH (ref 11.4–15.2)

## 2019-04-21 LAB — DIFFERENTIAL
Abs Immature Granulocytes: 0.07 10*3/uL (ref 0.00–0.07)
Basophils Absolute: 0.1 10*3/uL (ref 0.0–0.1)
Basophils Relative: 1 %
Eosinophils Absolute: 0.2 10*3/uL (ref 0.0–0.5)
Eosinophils Relative: 1 %
Immature Granulocytes: 1 %
Lymphocytes Relative: 29 %
Lymphs Abs: 4.1 10*3/uL — ABNORMAL HIGH (ref 0.7–4.0)
Monocytes Absolute: 1.2 10*3/uL — ABNORMAL HIGH (ref 0.1–1.0)
Monocytes Relative: 8 %
Neutro Abs: 8.5 10*3/uL — ABNORMAL HIGH (ref 1.7–7.7)
Neutrophils Relative %: 60 %

## 2019-04-21 LAB — APTT: aPTT: 27 seconds (ref 24–36)

## 2019-04-21 MED ORDER — INSULIN ASPART 100 UNIT/ML ~~LOC~~ SOLN
0.0000 [IU] | Freq: Every day | SUBCUTANEOUS | Status: DC
  Administered 2019-04-21: 22:00:00 3 [IU] via SUBCUTANEOUS
  Administered 2019-04-22 – 2019-04-23 (×2): 4 [IU] via SUBCUTANEOUS

## 2019-04-21 MED ORDER — ASPIRIN 300 MG RE SUPP: 300.0000 mg | Freq: Once | RECTAL | Status: DC

## 2019-04-21 MED ORDER — STROKE: EARLY STAGES OF RECOVERY BOOK
Freq: Once | Status: DC
  Filled 2019-04-21: qty 1

## 2019-04-21 MED ORDER — DIPHENHYDRAMINE HCL 50 MG/ML IJ SOLN
12.5000 mg | Freq: Every evening | INTRAMUSCULAR | Status: DC | PRN
  Administered 2019-04-21: 12.5 mg via INTRAVENOUS
  Filled 2019-04-21: qty 1

## 2019-04-21 MED ORDER — ACETAMINOPHEN 650 MG RE SUPP: 650.0000 mg | RECTAL | Status: DC | PRN

## 2019-04-21 MED ORDER — SODIUM CHLORIDE 0.9% FLUSH: 3.0000 mL | Freq: Once | INTRAVENOUS | Status: DC

## 2019-04-21 MED ORDER — PANTOPRAZOLE SODIUM 40 MG PO TBEC
40.0000 mg | DELAYED_RELEASE_TABLET | Freq: Two times a day (BID) | ORAL | Status: DC
  Administered 2019-04-22 – 2019-04-24 (×5): 40 mg via ORAL
  Filled 2019-04-21 (×5): qty 1

## 2019-04-21 MED ORDER — LORAZEPAM 2 MG/ML IJ SOLN
INTRAMUSCULAR | Status: AC
  Filled 2019-04-21: qty 1

## 2019-04-21 MED ORDER — ATORVASTATIN CALCIUM 80 MG PO TABS
80.0000 mg | ORAL_TABLET | Freq: Every day | ORAL | Status: DC
  Administered 2019-04-22 – 2019-04-23 (×2): 80 mg via ORAL
  Filled 2019-04-21 (×2): qty 1

## 2019-04-21 MED ORDER — ACETAMINOPHEN 325 MG PO TABS
650.0000 mg | ORAL_TABLET | ORAL | Status: DC | PRN
  Administered 2019-04-23 – 2019-04-24 (×2): 650 mg via ORAL
  Filled 2019-04-21 (×2): qty 2

## 2019-04-21 MED ORDER — ASPIRIN EC 81 MG PO TBEC
81.0000 mg | DELAYED_RELEASE_TABLET | Freq: Every day | ORAL | Status: DC
  Administered 2019-04-22 – 2019-04-24 (×3): 81 mg via ORAL
  Filled 2019-04-21 (×3): qty 1

## 2019-04-21 MED ORDER — LEVOTHYROXINE SODIUM 50 MCG PO TABS
50.0000 ug | ORAL_TABLET | Freq: Every day | ORAL | Status: DC
  Administered 2019-04-23 – 2019-04-24 (×2): 50 ug via ORAL
  Filled 2019-04-21 (×2): qty 1

## 2019-04-21 MED ORDER — ACETAMINOPHEN 160 MG/5ML PO SOLN: 650.0000 mg | ORAL | Status: DC | PRN

## 2019-04-21 MED ORDER — DULOXETINE HCL 30 MG PO CPEP
30.0000 mg | ORAL_CAPSULE | Freq: Two times a day (BID) | ORAL | Status: DC
  Administered 2019-04-22 – 2019-04-24 (×5): 30 mg via ORAL
  Filled 2019-04-21 (×5): qty 1

## 2019-04-21 MED ORDER — INSULIN ASPART 100 UNIT/ML ~~LOC~~ SOLN
0.0000 [IU] | Freq: Three times a day (TID) | SUBCUTANEOUS | Status: DC
  Administered 2019-04-22: 11 [IU] via SUBCUTANEOUS
  Administered 2019-04-22: 8 [IU] via SUBCUTANEOUS
  Administered 2019-04-22: 15 [IU] via SUBCUTANEOUS
  Administered 2019-04-23: 5 [IU] via SUBCUTANEOUS
  Administered 2019-04-23: 8 [IU] via SUBCUTANEOUS
  Administered 2019-04-23 – 2019-04-24 (×2): 5 [IU] via SUBCUTANEOUS
  Administered 2019-04-24: 2 [IU] via SUBCUTANEOUS

## 2019-04-21 MED ORDER — APIXABAN 5 MG PO TABS
5.0000 mg | ORAL_TABLET | Freq: Two times a day (BID) | ORAL | Status: DC
  Administered 2019-04-22 – 2019-04-24 (×5): 5 mg via ORAL
  Filled 2019-04-21 (×5): qty 1

## 2019-04-21 MED ORDER — SODIUM CHLORIDE 0.9 % IV SOLN: INTRAVENOUS | Status: DC

## 2019-04-21 NOTE — ED Triage Notes (Signed)
Pt here from Nursing home at or around 9 am , home health nurse found pt awake but not talking , code stroke called ,

## 2019-04-21 NOTE — Consult Note (Signed)
Requesting Physician: Dr. Rubin PayorPickering     Chief Complaint: Sudden onset difficulty speaking  History obtained from: EMS and Chart    HPI:                                                                                                                                       Casey Harrell is a 84 y.o. female with past medical history significant for paroxysmal atrial fibrillation on Eliquis, prior history of CVAs, no intracranial atherosclerotic disease, CKD stage III, diabetes mellitus, hypertension brought to the emergency department for sudden onset difficulty with speech.  Patient was last seen normal at 9 AM today by her nurse at the skilled living facility.  Around 12:00 when she went to evaluate the patient she was not able to talk normally and had difficulty answering questions.  EMS was called and evaluated the patient and noted aphasia.  No gaze deviation was seen and patient had equal grip strength bilaterally.  [facility] ER as a code stroke.  On arrival, NIH stroke scale was 5.  Stat CT head was obtained which was unremarkable.  CT angiogram could not be performed due to contrast allergy so therefore stat MRI MRA obtained.  MRI brain showed acute left MCA infarcts.  MR angiogram demonstrated a left M1 occlusion versus stenosis, likely chronic as noted in prior MR angiograms.  Patient was not a candidate for TPA as she received Eliquis this morning and she is  Prior stroke history 10/03/18: Right lower pontine infarct 03/31/15: Patchy left MCA infarcts, left M1 stenosis noted that.  MRI also at that time noted old bilateral occipital lobe infarcts.  Presented with aphasia and right-sided weakness.   04/02/15: Extension of left MCA infarction. 30-day cardiac event monitoring showed episodes of paroxysmal A. fib and patient started on eliquis  Date last known well: 12.14/20 Time last known well: 9 AM tPA Given: No, patient on Eliquis NIHSS: 5 Baseline MRS 2    Past Medical  History:  Diagnosis Date  . ASTHMA 07/15/2008  . COPD (chronic obstructive pulmonary disease) (HCC)   . DIABETES-TYPE 2 07/15/2008  . DIABETIC PERIPHERAL NEUROPATHY 09/02/2009  . GERD 07/15/2008  . HYPERTENSION 07/15/2008  . Normal cardiac stress test    low risk Nuc 2009, Feb 2011  . Paroxysmal atrial fibrillation (HCC)   . PVD (peripheral vascular disease) (HCC) 06/18/09   RCE  . Stroke (H[facility])   . Vertigo     Past Surgical History:  Procedure Laterality Date  . ABDOMINAL HYSTERECTOMY  1971  . APPENDECTOMY  1971  . CAROTID ENDARTERECTOMY  06/18/09   RCE  . CESAREAN SECTION    . colectomy and colostomy    . FLEXIBLE SIGMOIDOSCOPY     diverticulitis  . FOOT SURGERY     right foot X 2  . LAPAROTOMY     X 3 with adhesions lysis  .  TONSILLECTOMY    . TUBAL LIGATION      Family History  Problem Relation Age of Onset  . Diabetes Mother   . Arthritis Mother   . Heart disease Mother   . Heart disease Father   . Heart disease Sister   . Heart disease Brother   . Cancer Brother 60       lung cancer  . Celiac disease Sister   . Stroke Maternal Grandfather   . Cancer Paternal Grandfather        lung  . Heart disease Sister   . Asthma Sister    Social History:  reports that she has never smoked. She has never used smokeless tobacco. She reports that she does not drink alcohol or use drugs.  Allergies:  Allergies  Allergen Reactions  . Doxycycline Hyclate Nausea And Vomiting  . Morphine Sulfate Other (See Comments)    Causes hallucinations  . Iohexol Hives    Patient stated in 2010 this caused HIVES   . Moxifloxacin Other (See Comments)    Reaction not readily recalled??    Medications:                                                                                                                        I reviewed home medications   ROS:                                                                                                                                      Unable to review systems due to patient's aphasia   Examination:                                                                                                      General: Appears well-developed  Psych: Anxious, upset Eyes: No scleral injection HENT: No OP obstrucion Head: Normocephalic.  Cardiovascular: Irregular rate and rhythm Respiratory: Effort normal and breath sounds normal to anterior ascultation GI: Soft.  No distension. There is  no tenderness.  Skin: WDI    Neurological Examination Mental Status: Alert, patient has expressive greater than receptive aphasia.  Can answer yes no to simple questions.  Has significant difficulty naming, including herself or her children.  Cannot name objects.  Cannot count fingers.  Follows simple commands.  Appears anxious and is crying  II: Visual fields grossly normal:  III,IV, VI: ptosis not present, extra-ocular motions intact bilaterally, pupils equal, round, reactive to light and accommodation VII: Mild right facial droop  VIII: hearing normal bilaterally IX,X: uvula rises symmetrically XI: bilateral shoulder shrug XII: midline tongue extension Motor: Right : Upper extremity   5/5    Left:     Upper extremity   5/5  Lower extremity   4/5     Lower extremity   5/5 Tone and bulk:normal tone throughout; no atrophy noted Sensory: Appears to be intact but difficult to assess due to aphasia Plantars: Right: downgoing   Left: downgoing Cerebellar: normal finger-to-nose bilaterally   Lab Results: Basic Metabolic Panel: Recent Labs  Lab 04/21/19 1230 04/21/19 1231  NA 131* 131*  K 3.7 3.6  CL 93* 94*  CO2 21*  --   GLUCOSE 321* 313*  BUN 20 23  CREATININE 1.69* 1.20*  CALCIUM 9.8  --     CBC: Recent Labs  Lab 04/21/19 1230 04/21/19 1231  WBC 14.0*  --   NEUTROABS 8.5*  --   HGB 14.4 16.3*  HCT 44.1 48.0*  MCV 83.4  --   PLT 352  --     Coagulation Studies: Recent Labs    04/21/19 1230  LABPROT 16.5*  INR  1.3*    Imaging: CT HEAD CODE STROKE WO CONTRAST  Result Date: 04/21/2019 CLINICAL DATA:  Code stroke. Focal neuro deficit, greater than 6 hours, stroke suspected. EXAM: CT HEAD WITHOUT CONTRAST TECHNIQUE: Contiguous axial images were obtained from the base of the skull through the vertex without intravenous contrast. COMPARISON:  Head CT 03/16/2019 FINDINGS: Brain: Mildly motion degraded examination. No evidence of acute intracranial hemorrhage or acute demarcated cortical infarction. Redemonstrated chronic cortical/subcortical infarcts within the occipital lobes (more prominent on the left). Stable generalized parenchymal atrophy and chronic small vessel ischemic disease. Redemonstrated chronic lacunar infarcts within the bilateral basal ganglia and thalami. A now chronic infarct within the paramedian right pons was better appreciated on prior brain MRI. No evidence of intracranial mass. No midline shift or extra-axial fluid collection. Vascular: No hyperdense vessel.  Atherosclerotic calcifications. Skull: Normal. Negative for fracture or focal lesion. Sinuses/Orbits: Visualized orbits demonstrate no acute abnormality. Mild mucosal thickening within the sphenoid sinuses (greater on the right). Polypoid mucosal thickening within the inferior left maxillary sinus. No significant mastoid effusion These results were communicated to Dr. Laurence Slate At 12:50 pmon 12/14/2020by text page via the [facility] messaging system. IMPRESSION: No CT evidence of acute intracranial abnormality. Redemonstrated chronic infarcts within bilateral occipital lobes. Stable generalized parenchymal atrophy and chronic small vessel ischemic disease. Redemonstrated chronic lacunar infarcts in the bilateral basal ganglia, thalami and pons. Electronically Signed   By: Jackey Loge DO   On: 04/21/2019 12:52     ASSESSMENT AND PLAN   84 y.o. female with past medical history significant for paroxysmal atrial fibrillation on Eliquis, prior  history of CVAs, no intracranial atherosclerotic disease, CKD stage III, diabetes mellitus, hypertension brought to the emergency department with aphasia and mild right leg weakness due to acute left MCA infarctions.  Patient not hypotensive on arrival blood pressure in the 160s  systolic.  Unfortunately not a candidate for any acute intervention, patient on Eliquis which is contraindication for TPA and M1 likely chronically stenosed.  Recommendations Continue Eliquis, may consider addition of antiplatelet given failure.  Mechanism of stroke this time may be different than cardioembolic such as atheroembolic and therefore there may be a role of adding on antiplatelet despite increased hemorrhagic risk. We will defer repeating 2D echo, will not change management. Repeat A1c and lipid profile, continue statin.  May increase dose to 80 mg atorvastatin from current dose of 20 mg. Goal blood pressure should be permissive hypertension up to 185/1 10 mmHg Continue management of hyperglycemia/diabetes mellitus PT OT and speech therapy N.p.o. until passes swallow eval Frequent neurochecks  Stroke team to follow  [place] Pager Number [phone]

## 2019-04-21 NOTE — H&P (Addendum)
History and Physical    Casey Harrell QVZ:[id number] DOB: [birth year] DOA: 04/21/2019  PCP: [name], MD  Patient coming from: Independent living facility  I have personally briefly reviewed patient's old medical records in [facility]  Chief Complaint: slurred speech, confusion  HPI: Casey Harrell is a 84 y.o. female with medical history significant of hypertension, diabetes mellitus, paroxysmal A. fib on Eliquis, CKD stage IIIb, chronic hyponatremia, history of CVA brought by EMS for the concern of code stroke.  I called patient's son Mr. Ephriam Knuckles on this number [phone] who reports that patient was doing fine this morning and her nurse found her not talking and was not behaving like herself.  Patient has history of previous stroke and A. fib on Eliquis.  Her last Eliquis dose was this morning.  No history of headache, blurry vision, loss of consciousness, seizures, chest pain, shortness of breath, palpitation, leg swelling, nausea, vomiting, diarrhea, cough, congestion, fever, chills, urinary or bowel changes.  ED Course: Upon arrival to ED: Patient tachycardic, tachypneic, maintaining oxygen saturation on room air.  MRI brain shows new left MCA infarcts.  Neurology was consulted-tPA was not given as patient is on Eliquis.  Hospitalist service consulted for admission for stroke work-up.  Review of Systems: As per HPI otherwise negative.    Past Medical History:  Diagnosis Date  . ASTHMA 07/15/2008  . COPD (chronic obstructive pulmonary disease) (HCC)   . DIABETES-TYPE 2 07/15/2008  . DIABETIC PERIPHERAL NEUROPATHY 09/02/2009  . GERD 07/15/2008  . HYPERTENSION 07/15/2008  . Normal cardiac stress test    low risk Nuc 2009, Feb 2011  . Paroxysmal atrial fibrillation (HCC)   . PVD (peripheral vascular disease) (HCC) 06/18/09   RCE  . Stroke (HCC)   . Vertigo     Past Surgical History:  Procedure Laterality Date  . ABDOMINAL HYSTERECTOMY  1971  . APPENDECTOMY  1971  .  CAROTID ENDARTERECTOMY  06/18/09   RCE  . CESAREAN SECTION    . colectomy and colostomy    . FLEXIBLE SIGMOIDOSCOPY     diverticulitis  . FOOT SURGERY     right foot X 2  . LAPAROTOMY     X 3 with adhesions lysis  . TONSILLECTOMY    . TUBAL LIGATION       reports that she has never smoked. She has never used smokeless tobacco. She reports that she does not drink alcohol or use drugs.  Allergies  Allergen Reactions  . Doxycycline Hyclate Nausea And Vomiting  . Morphine Sulfate Other (See Comments)    Causes hallucinations  . Iohexol Hives    Patient stated in 2010 this caused HIVES   . Moxifloxacin Other (See Comments)    Reaction not readily recalled??    Family History  Problem Relation Age of Onset  . Diabetes Mother   . Arthritis Mother   . Heart disease Mother   . Heart disease Father   . Heart disease Sister   . Heart disease Brother   . Cancer Brother 60       lung cancer  . Celiac disease Sister   . Stroke Maternal Grandfather   . Cancer Paternal Grandfather        lung  . Heart disease Sister   . Asthma Sister     Prior to Admission medications   Medication Sig Start Date End Date Taking? Authorizing Provider  acetaminophen (TYLENOL) 325 MG tablet Take 325-650 mg by mouth every 6 (six)  hours as needed (for pain).     [provider]  Albuterol Sulfate (PROAIR RESPICLICK) 108 (90 Base) MCG/ACT AEPB Inhale 2 puffs into the lungs every 4 (four) hours as needed. 10/21/18   Angiulli, Mcarthur Rossettianiel J, PA-C  amLODipine (NORVASC) 2.5 MG tablet Take 3 tablets (7.5 mg total) by mouth daily. 10/21/18   Angiulli, Mcarthur Rossettianiel J, PA-C  apixaban (ELIQUIS) 5 MG TABS tablet Take 1 tablet (5 mg total) by mouth 2 (two) times daily. 03/25/19   Burchette, Elberta FortisBruce W, MD  atorvastatin (LIPITOR) 20 MG tablet TAKE 1 TABLET BY MOUTH EVERY DAY 04/18/19   Burchette, Elberta FortisBruce W, MD  BIOTIN PO Take 1 tablet by mouth daily.    [provider]  CALCIUM PO Take 1 tablet by mouth daily.     [provider]  Cholecalciferol (VITAMIN D3) 2000 units TABS Take 2,000 Units by mouth daily. Reported on 04/26/2015 04/13/17   Angiulli, Mcarthur Rossettianiel J, PA-C  Continuous Blood Gluc Receiver (FREESTYLE LIBRE 14 DAY READER) DEVI Apply 1 Device topically daily. 10/23/18   Burchette, Elberta FortisBruce W, MD  Continuous Blood Gluc Sensor (FREESTYLE LIBRE 14 DAY SENSOR) MISC Inject 1 Device into the skin every 14 (fourteen) days. 10/23/18   Burchette, Elberta FortisBruce W, MD  diclofenac sodium (VOLTAREN) 1 % GEL APPLY 2GRAMS TOPICALLY 4 TIMES DAILY TO RIGHT ANKLE 10/21/18   Angiulli, Mcarthur Rossettianiel J, PA-C  DULoxetine (CYMBALTA) 30 MG capsule Take 1 capsule (30 mg total) by mouth 2 (two) times daily. 10/21/18   Angiulli, Mcarthur Rossettianiel J, PA-C  fluticasone (FLONASE) 50 MCG/ACT nasal spray Place 1 spray into both nostrils daily. 10/21/18   Angiulli, Mcarthur Rossettianiel J, PA-C  Insulin Detemir (LEVEMIR) 100 UNIT/ML Pen Inject 28 Units into the skin 2 (two) times daily. 10/21/18   Angiulli, Mcarthur Rossettianiel J, PA-C  insulin lispro (HUMALOG KWIKPEN) 100 UNIT/ML KwikPen Use tid with meals per sliding scale as directed 03/25/19   Burchette, Elberta FortisBruce W, MD  Insulin Pen Needle (B-D ULTRAFINE III SHORT PEN) 31G X 8 MM MISC USE TO INJECT INSULIN 5 TIMES DAILY 12/16/18   Burchette, Elberta FortisBruce W, MD  levothyroxine (SYNTHROID) 50 MCG tablet TAKE 1 TABLET BY MOUTH EVERY DAY BEFORE BREAKFAST 04/18/19   Burchette, Elberta FortisBruce W, MD  ondansetron (ZOFRAN ODT) 4 MG disintegrating tablet 4mg  ODT q4 hours prn nausea/vomit 03/16/19   Charlynne PanderYao, David Hsienta, MD  [facility]edo[facility] ULTRA test strip TEST TWICE A DAY 04/16/19   Burchette, Elberta FortisBruce W, MD  pantoprazole (PROTONIX) 40 MG tablet Take 1 tablet (40 mg total) by mouth 2 (two) times daily. 10/21/18   Charlton AmorAngiulli, Daniel J, PA-C    Physical Exam: Vitals:   04/21/19 1336 04/21/19 1345 04/21/19 1415 04/21/19 1700  BP:  (!) 156/89 105/78 (!) 166/92  Pulse:  (!) 103 (!) 102 97  Resp:  (!) 26 (!) 21 20  Temp: (!) 97 F (36.1 C)   98.4 F (36.9 C)  TempSrc: Temporal    Oral  SpO2:  100% 96% 98%    Constitutional: NAD, calm, comfortable, on room air, alert, following commands but not oriented to time place and person Eyes: PERRL, lids and conjunctivae normal ENMT: Mucous membranes are moist. Posterior pharynx clear of any exudate or lesions.Normal dentition.  Neck: normal, supple, no masses, no thyromegaly Respiratory: clear to auscultation bilaterally, no wheezing, no crackles. Normal respiratory effort. No accessory muscle use.  Cardiovascular: Regular rate and rhythm, no murmurs / rubs / gallops. No extremity edema. 2+ pedal pulses. No carotid bruits.  Abdomen: no tenderness, no  masses palpated. No hepatosplenomegaly. Bowel sounds positive.  Musculoskeletal: no clubbing / cyanosis. No joint deformity upper and lower extremities. Good ROM, no contractures. Normal muscle tone.  Skin: no rashes, lesions, ulcers. No induration Neurologic: Slurred speech noted, power 5 out of 5 in bilateral upper and lower extremities.  Sensation intact.   Labs on Admission: I have personally reviewed following labs and imaging studies  CBC: Recent Labs  Lab 04/21/19 1230 04/21/19 1231  WBC 14.0*  --   NEUTROABS 8.5*  --   HGB 14.4 16.3*  HCT 44.1 48.0*  MCV 83.4  --   PLT 352  --    Basic Metabolic Panel: Recent Labs  Lab 04/21/19 1230 04/21/19 1231  NA 131* 131*  K 3.7 3.6  CL 93* 94*  CO2 21*  --   GLUCOSE 321* 313*  BUN 20 23  CREATININE 1.69* 1.20*  CALCIUM 9.8  --    GFR: CrCl cannot be calculated (Unknown ideal weight.). Liver Function Tests: Recent Labs  Lab 04/21/19 1230  AST 23  ALT 17  ALKPHOS 116  BILITOT 0.6  PROT 7.5  ALBUMIN 3.8   No results for input(s): LIPASE, AMYLASE in the last 168 hours. No results for input(s): AMMONIA in the last 168 hours. Coagulation Profile: Recent Labs  Lab 04/21/19 1230  INR 1.3*   Cardiac Enzymes: No results for input(s): CKTOTAL, CKMB, CKMBINDEX, TROPONINI in the last 168 hours. BNP (last  3 results) No results for input(s): PROBNP in the last 8760 hours. HbA1C: No results for input(s): HGBA1C in the last 72 hours. CBG: No results for input(s): GLUCAP in the last 168 hours. Lipid Profile: No results for input(s): CHOL, HDL, LDLCALC, TRIG, CHOLHDL, LDLDIRECT in the last 72 hours. Thyroid Function Tests: No results for input(s): TSH, T4TOTAL, FREET4, T3FREE, THYROIDAB in the last 72 hours. Anemia Panel: No results for input(s): VITAMINB12, FOLATE, FERRITIN, TIBC, IRON, RETICCTPCT in the last 72 hours. Urine analysis:    Component Value Date/Time   COLORURINE STRAW (A) 03/16/2019 1949   APPEARANCEUR CLEAR 03/16/2019 1949   LABSPEC 1.020 03/16/2019 1949   PHURINE 6.0 03/16/2019 1949   GLUCOSEU >=500 (A) 03/16/2019 1949   HGBUR SMALL (A) 03/16/2019 1949   BILIRUBINUR NEGATIVE 03/16/2019 1949   BILIRUBINUR neg 10/15/2017 1527   KETONESUR 5 (A) 03/16/2019 1949   PROTEINUR 30 (A) 03/16/2019 1949   UROBILINOGEN 0.2 10/15/2017 1527   UROBILINOGEN 0.2 11/05/2014 1555   NITRITE NEGATIVE 03/16/2019 1949   LEUKOCYTESUR NEGATIVE 03/16/2019 1949    Radiological Exams on Admission: MR ANGIO HEAD WO CONTRAST  Result Date: 04/21/2019 CLINICAL DATA:  84 year old female with code stroke presentation. History of multifocal advanced intracranial artery stenoses. EXAM: MRI HEAD WITHOUT CONTRAST MRA HEAD WITHOUT CONTRAST TECHNIQUE: Multiplanar, multiecho pulse sequences of the brain and surrounding structures were obtained without intravenous contrast. Angiographic images of the head were obtained using MRA technique without contrast. COMPARISON:  Plain head CT 1238 hours today. Brain MRI and intracranial MRA 10/03/2018 and earlier. FINDINGS: MRI HEAD FINDINGS Brain: Scattered small areas of cortical and white matter restricted diffusion at the posterior left sylvian fissure, most notably series 5, image 76. Additional focal cortical involvement at the left superolateral motor strip on image  89. Mild parietal lobe involvement on image 83. No other vascular territory restricted diffusion. Chronic ischemic disease with encephalomalacia in the left MCA territory, deep gray matter nuclei, brainstem. Additional confluent bilateral cerebral white matter T2 and FLAIR hyperintensity. Chronic blood products in the  left deep cerebellar nuclei and brainstem. No midline shift, mass effect, evidence of mass lesion, ventriculomegaly, extra-axial collection or acute intracranial hemorrhage. Cervicomedullary junction and pituitary are within normal limits. Vascular: Major intracranial vascular flow voids are grossly stable. Skull and upper cervical spine: Negative visible cervical spine. Visualized bone marrow signal is within normal limits. Sinuses/Orbits: Stable. Other: Mastoids remain clear.  Negative scalp soft tissues. MRA HEAD FINDINGS Degraded by motion despite repeated imaging attempts. Preserved antegrade flow signal in the distal vertebral arteries, basilar, and both ICA siphons. Stable appearance of the right PICA. Asymmetric loss of the left MCA M1 segment flow signal, which on comparison with source images from 10/03/2018 appears unchanged and related to chronic severe left M1 stenosis. Asymmetrically decreased left MCA M2 branch flow has not definitely changed when allowing for motion today. The contralateral right M1, right MCA bifurcation and proximal branches appear to remain within normal limits. Chronic asymmetrically decreased signal also in the right ACA A1. Chronic bilateral PCA stenosis was also better demonstrated on the prior study. IMPRESSION: 1. Scattered small acute infarcts in the left MCA territory in the setting of chronic severe left M1 stenosis. No associated hemorrhage or mass effect. Study discussed by telephone with Neurology Dr. Lorraine Lax on 04/21/2019 at 1339 hours. 2. Otherwise stable advanced chronic intracranial ischemic disease. 3. Motion degraded MRA today appears otherwise stable  since May this year. Electronically Signed   By: Genevie Ann M.D.   On: 04/21/2019 13:59   MR BRAIN WO CONTRAST  Result Date: 04/21/2019 CLINICAL DATA:  84 year old female with code stroke presentation. History of multifocal advanced intracranial artery stenoses. EXAM: MRI HEAD WITHOUT CONTRAST MRA HEAD WITHOUT CONTRAST TECHNIQUE: Multiplanar, multiecho pulse sequences of the brain and surrounding structures were obtained without intravenous contrast. Angiographic images of the head were obtained using MRA technique without contrast. COMPARISON:  Plain head CT 1238 hours today. Brain MRI and intracranial MRA 10/03/2018 and earlier. FINDINGS: MRI HEAD FINDINGS Brain: Scattered small areas of cortical and white matter restricted diffusion at the posterior left sylvian fissure, most notably series 5, image 76. Additional focal cortical involvement at the left superolateral motor strip on image 89. Mild parietal lobe involvement on image 83. No other vascular territory restricted diffusion. Chronic ischemic disease with encephalomalacia in the left MCA territory, deep gray matter nuclei, brainstem. Additional confluent bilateral cerebral white matter T2 and FLAIR hyperintensity. Chronic blood products in the left deep cerebellar nuclei and brainstem. No midline shift, mass effect, evidence of mass lesion, ventriculomegaly, extra-axial collection or acute intracranial hemorrhage. Cervicomedullary junction and pituitary are within normal limits. Vascular: Major intracranial vascular flow voids are grossly stable. Skull and upper cervical spine: Negative visible cervical spine. Visualized bone marrow signal is within normal limits. Sinuses/Orbits: Stable. Other: Mastoids remain clear.  Negative scalp soft tissues. MRA HEAD FINDINGS Degraded by motion despite repeated imaging attempts. Preserved antegrade flow signal in the distal vertebral arteries, basilar, and both ICA siphons. Stable appearance of the right PICA.  Asymmetric loss of the left MCA M1 segment flow signal, which on comparison with source images from 10/03/2018 appears unchanged and related to chronic severe left M1 stenosis. Asymmetrically decreased left MCA M2 branch flow has not definitely changed when allowing for motion today. The contralateral right M1, right MCA bifurcation and proximal branches appear to remain within normal limits. Chronic asymmetrically decreased signal also in the right ACA A1. Chronic bilateral PCA stenosis was also better demonstrated on the prior study. IMPRESSION: 1. Scattered small acute  infarcts in the left MCA territory in the setting of chronic severe left M1 stenosis. No associated hemorrhage or mass effect. Study discussed by telephone with Neurology Dr. Laurence Slate on 04/21/2019 at 1339 hours. 2. Otherwise stable advanced chronic intracranial ischemic disease. 3. Motion degraded MRA today appears otherwise stable since May this year. Electronically Signed   By: Odessa Fleming M.D.   On: 04/21/2019 13:59   CT HEAD CODE STROKE WO CONTRAST  Result Date: 04/21/2019 CLINICAL DATA:  Code stroke. Focal neuro deficit, greater than 6 hours, stroke suspected. EXAM: CT HEAD WITHOUT CONTRAST TECHNIQUE: Contiguous axial images were obtained from the base of the skull through the vertex without intravenous contrast. COMPARISON:  Head CT 03/16/2019 FINDINGS: Brain: Mildly motion degraded examination. No evidence of acute intracranial hemorrhage or acute demarcated cortical infarction. Redemonstrated chronic cortical/subcortical infarcts within the occipital lobes (more prominent on the left). Stable generalized parenchymal atrophy and chronic small vessel ischemic disease. Redemonstrated chronic lacunar infarcts within the bilateral basal ganglia and thalami. A now chronic infarct within the paramedian right pons was better appreciated on prior brain MRI. No evidence of intracranial mass. No midline shift or extra-axial fluid collection. Vascular:  No hyperdense vessel.  Atherosclerotic calcifications. Skull: Normal. Negative for fracture or focal lesion. Sinuses/Orbits: Visualized orbits demonstrate no acute abnormality. Mild mucosal thickening within the sphenoid sinuses (greater on the right). Polypoid mucosal thickening within the inferior left maxillary sinus. No significant mastoid effusion These results were communicated to Dr. Laurence Slate At 12:50 pmon 12/14/2020by text page via the [facility] messaging system. IMPRESSION: No CT evidence of acute intracranial abnormality. Redemonstrated chronic infarcts within bilateral occipital lobes. Stable generalized parenchymal atrophy and chronic small vessel ischemic disease. Redemonstrated chronic lacunar infarcts in the bilateral basal ganglia, thalami and pons. Electronically Signed   By: Jackey Loge DO   On: 04/21/2019 12:52    EKG: Normal sinus rhythm, atrial premature complexes, no ST elevation or depression noted.  Assessment/Plan Active Problems:   Hypothyroidism   Essential hypertension   GERD   Leukocytosis   Hyponatremia   HLD (hyperlipidemia)   DM (diabetes mellitus) (HCC)   PAF (paroxysmal atrial fibrillation) (HCC)   Ischemic stroke (HCC)   Acute left MCA ischemic stroke: -Reviewed CT head, MRI and MRA head -admit forTelemetry monitoring -Stroke protocol -Allow for permissive hypertension for the first 24-48h - only treat PRN if SBP >220 mmHg or diastolic blood pressure >120. Blood pressures can be gradually normalized to SBP<140 upon discharge. -ASA given -Ordered carotid Doppler ultrasound, Lipid Panel, TSH and A1C -Frequent neuro checks -Atorvastatin PO within 24 hrs-increased dose of atorvastatin 20 mg once daily. -Consult Neurology -Keep her n.p.o. until she passes the bedside swallow evaluation. -PT/OT eval, Speech consult  AKI on CKD stage IIIb: -Creatinine today is 1.69, GFR 28, baseline creatinine is 1.04, GFR 50 -Start on gentle hydration and monitor kidney function  closely.  Avoid nephrotoxic medication. -Repeat BMP tomorrow a.m.  Hypertension:  -On amlodipine at home-hold for now to allow permissive hypertension.  Monitor blood pressure closely.  Diabetes mellitus: Check A1c -Blood sugar elevated upon arrival. -On Levemir and  lispro at home.  Hold for now -Started on sliding scale insulin and monitor blood sugar closely  Hypothyroidism: Check TSH -Continue levothyroxine  Hyperlipidemia: Check lipid panel -Increased dose of statin to 80 mg once daily.  Paroxysmal A. fib: Rate controlled -Reviewed EKG.  Continue Eliquis.  On telemetry -Monitor heart rate closely.  Leukocytosis: WBC of 14.0.  Likely reactive -COVID-19 pending. -  Patient is afebrile.  Will check UA. -Monitor for now.  Chronic hyponatremia: Sodium is 131 -Monitor for now.   DVT prophylaxis: TED/SCD/Eliquis Code Status: full code-confirmed with the son Family Communication: None present at bedside.  Plan of care discussed with patient's son over the phone  in length and he verbalized understanding and agreed with it. Disposition Plan: TBD Consults called: Neurology Admission status: Inpatient.   Ollen Bowl MD [facility] Pager [phone]  If 7PM-7AM, please contact night-coverage [email or web address] Password [facility]  04/21/2019, 5:23 PM

## 2019-04-21 NOTE — Code Documentation (Signed)
Patient arrived via EMS at 1223, Code Stroke called enroute. LKW at 0900.  Dr Aroor met her upon arrival.  Stat labs and head CT done.  NIHSS 6, mild facial droop, left leg drift, aphasia. She is on eliquis.  Patient allergic to Iohexol, stat MRI/MRA ordered.  Per Dr aroor acute CVA noted on MRI, but no LVO.  VS/Neuro checks q 2hr Hand off with ED RN [place]

## 2019-04-21 NOTE — ED Provider Notes (Signed)
[facility] EMERGENCY DEPARTMENT Provider Note   CSN: [id number] Arrival date & time: 04/21/19  1223  An emergency department physician performed an initial assessment on this suspected stroke patient at 1310.  History Chief Complaint  Patient presents with  . Code Stroke    Casey Harrell is a 84 y.o. female.  HPI Patient came in as a code stroke.  Level 5 caveat due to aphasia. Reportedly had been normal at 9:00 talk to family.  Home health nurse got there prior to arrival to the ER and found her not talking.  Code stroke called prior to the hospital.  Appears moving all extremities.  History of previous stroke is on Eliquis.  Patient will basically only say I do not know and will follow some commands but does seem anxious.    Past Medical History:  Diagnosis Date  . ASTHMA 07/15/2008  . COPD (chronic obstructive pulmonary disease) (HCC)   . DIABETES-TYPE 2 07/15/2008  . DIABETIC PERIPHERAL NEUROPATHY 09/02/2009  . GERD 07/15/2008  . HYPERTENSION 07/15/2008  . Normal cardiac stress test    low risk Nuc 2009, Feb 2011  . Paroxysmal atrial fibrillation (HCC)   . PVD (peripheral vascular disease) (HCC) 06/18/09   RCE  . Stroke (HCC)   . Vertigo     Patient Active Problem List   Diagnosis Date Noted  . Labile blood pressure   . Labile blood glucose   . AKI (acute kidney injury) (HCC)   . Diabetes mellitus type 2 in nonobese (HCC)   . PAF (paroxysmal atrial fibrillation) (HCC)   . Reactive hypertension   . Right pontine CVA (HCC) 10/08/2018  . COPD (chronic obstructive pulmonary disease) (HCC) 10/03/2018  . 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 Left pontine cerebrovascular accident (HCC) 04/03/2017  . History of CVA with residual deficit   . Type 2 diabetes mellitus with peripheral neuropathy (HCC)   . Anemia 03/30/2017  . History of stroke   . Chest pain 02/09/2016  . BPPV (benign paroxysmal positional vertigo) 08/25/2015  . HLD  (hyperlipidemia) 08/25/2015  . At high risk for falls 07/01/2015  . Vertigo 06/21/2015  . Paroxysmal atrial fibrillation (HCC) 06/11/2015  . Cerebral infarction due to stenosis of left middle cerebral artery (HCC) 05/20/2015  . Intracranial vascular stenosis 05/20/2015  . Type 2 diabetes mellitus with circulatory disorder (HCC) 05/20/2015  . Type 2 diabetes, uncontrolled, with neuropathy (HCC)   . Abdominal pain   . Partial small bowel obstruction (HCC)   . Benign essential HTN   . Small bowel obstruction (HCC) 05/07/2015  . Hemiplegia, unspecified affecting right dominant side (HCC) 04/08/2015  . Embolic stroke involving posterior cerebral artery (HCC) 04/05/2015  . Orthostatic hypotension   . Hypertensive urgency 04/01/2015  . Fever in adult 04/01/2015  . Acute encephalopathy 04/01/2015  . Confusion   . CVA (cerebral infarction)   . Posterior circulation stroke (HCC)   . Acute ischemic stroke (HCC) 03/31/2015  . Stroke (HCC) 03/31/2015  . CKD (chronic kidney disease) stage 3, GFR 30-59 ml/min 04/17/2014  . Gastroenteritis, non-infectious 04/11/2014  . Nausea vomiting and diarrhea 04/10/2014  . Hypokalemia 04/10/2014  . Dehydration 04/09/2014  . Bronchitis with airway obstruction (HCC) 02/03/2013  . Leukocytosis 02/03/2013  . Fever 02/03/2013  . Acute respiratory failure with hypoxia (HCC) 02/03/2013  . Depression 02/03/2013  . Hyponatremia 02/03/2013  . Obesity (BMI 30-39.9) 01/10/2013  . PVD (peripheral vascular disease)- S/P RCE 06/18/09 12/13/2012  . Sleep apnea-  C-pap intol 12/13/2012  . Low risk cardiac nuclear stress test- 2009 and Feb 2011 12/13/2012  . Mild persistent asthma 11/29/2011  . Chronic cough 06/05/2011  . Diabetic neuropathy (HCC) 09/02/2009  . Dyslipidemia 07/13/2009  . Hypothyroidism 11/12/2008  . Type 2 diabetes mellitus, uncontrolled (HCC) 07/15/2008  . Essential hypertension 07/15/2008  . GERD 07/15/2008  . DIVERTICULOSIS, COLON 07/15/2008     Past Surgical History:  Procedure Laterality Date  . ABDOMINAL HYSTERECTOMY  1971  . APPENDECTOMY  1971  . CAROTID ENDARTERECTOMY  06/18/09   RCE  . CESAREAN SECTION    . colectomy and colostomy    . FLEXIBLE SIGMOIDOSCOPY     diverticulitis  . FOOT SURGERY     right foot X 2  . LAPAROTOMY     X 3 with adhesions lysis  . TONSILLECTOMY    . TUBAL LIGATION       OB History   No obstetric history on file.     Family History  Problem Relation Age of Onset  . Diabetes Mother   . Arthritis Mother   . Heart disease Mother   . Heart disease Father   . Heart disease Sister   . Heart disease Brother   . Cancer Brother 60       lung cancer  . Celiac disease Sister   . Stroke Maternal Grandfather   . Cancer Paternal Grandfather        lung  . Heart disease Sister   . Asthma Sister     Social History   Tobacco Use  . Smoking status: Never Smoker  . Smokeless tobacco: Never Used  Substance Use Topics  . Alcohol use: No  . Drug use: No    Home Medications Prior to Admission medications   Medication Sig Start Date End Date Taking? Authorizing Provider  acetaminophen (TYLENOL) 325 MG tablet Take 325-650 mg by mouth every 6 (six) hours as needed (for pain).     [provider]  Albuterol Sulfate (PROAIR RESPICLICK) 108 (90 Base) MCG/ACT AEPB Inhale 2 puffs into the lungs every 4 (four) hours as needed. 10/21/18   Angiulli, Mcarthur Rossetti, PA-C  amLODipine (NORVASC) 2.5 MG tablet Take 3 tablets (7.5 mg total) by mouth daily. 10/21/18   Angiulli, Mcarthur Rossetti, PA-C  apixaban (ELIQUIS) 5 MG TABS tablet Take 1 tablet (5 mg total) by mouth 2 (two) times daily. 03/25/19   Burchette, Elberta Fortis, MD  atorvastatin (LIPITOR) 20 MG tablet TAKE 1 TABLET BY MOUTH EVERY DAY 04/18/19   Burchette, Elberta Fortis, MD  BIOTIN PO Take 1 tablet by mouth daily.    [provider]  CALCIUM PO Take 1 tablet by mouth daily.    [provider]  Cholecalciferol (VITAMIN D3) 2000 units TABS  Take 2,000 Units by mouth daily. Reported on 04/26/2015 04/13/17   Angiulli, Mcarthur Rossetti, PA-C  Continuous Blood Gluc Receiver (FREESTYLE LIBRE 14 DAY READER) DEVI Apply 1 Device topically daily. 10/23/18   Burchette, Elberta Fortis, MD  Continuous Blood Gluc Sensor (FREESTYLE LIBRE 14 DAY SENSOR) MISC Inject 1 Device into the skin every 14 (fourteen) days. 10/23/18   Burchette, Elberta Fortis, MD  diclofenac sodium (VOLTAREN) 1 % GEL APPLY 2GRAMS TOPICALLY 4 TIMES DAILY TO RIGHT ANKLE 10/21/18   Angiulli, Mcarthur Rossetti, PA-C  DULoxetine (CYMBALTA) 30 MG capsule Take 1 capsule (30 mg total) by mouth 2 (two) times daily. 10/21/18   Angiulli, Mcarthur Rossetti, PA-C  fluticasone (FLONASE) 50 MCG/ACT nasal spray Place 1  spray into both nostrils daily. 10/21/18   Angiulli, Mcarthur Rossettianiel J, PA-C  Insulin Detemir (LEVEMIR) 100 UNIT/ML Pen Inject 28 Units into the skin 2 (two) times daily. 10/21/18   Angiulli, Mcarthur Rossettianiel J, PA-C  insulin lispro (HUMALOG KWIKPEN) 100 UNIT/ML KwikPen Use tid with meals per sliding scale as directed 03/25/19   Burchette, Elberta FortisBruce W, MD  Insulin Pen Needle (B-D ULTRAFINE III SHORT PEN) 31G X 8 MM MISC USE TO INJECT INSULIN 5 TIMES DAILY 12/16/18   Burchette, Elberta FortisBruce W, MD  levothyroxine (SYNTHROID) 50 MCG tablet TAKE 1 TABLET BY MOUTH EVERY DAY BEFORE BREAKFAST 04/18/19   Burchette, Elberta FortisBruce W, MD  ondansetron (ZOFRAN ODT) 4 MG disintegrating tablet 4mg  ODT q4 hours prn nausea/vomit 03/16/19   Charlynne PanderYao, David Hsienta, MD  [facility] ServicesNETOUCH ULTRA test strip TEST TWICE A DAY 04/16/19   Burchette, Elberta FortisBruce W, MD  pantoprazole (PROTONIX) 40 MG tablet Take 1 tablet (40 mg total) by mouth 2 (two) times daily. 10/21/18   Angiulli, Mcarthur Rossettianiel J, PA-C    Allergies    Doxycycline hyclate, Morphine sulfate, Iohexol, and Moxifloxacin  Review of Systems   Review of Systems  Unable to perform ROS: Mental status change    Physical Exam Updated Vital Signs BP (!) 156/89   Pulse (!) 103   Temp (!) 97 F (36.1 C) (Temporal)   Resp (!) 26   SpO2 100%   Physical  Exam Vitals reviewed.  Constitutional:      Appearance: Normal appearance.  HENT:     Head: Atraumatic.  Eyes:     Pupils: Pupils are equal, round, and reactive to light.  Cardiovascular:     Comments: Mild tachycardia Pulmonary:     Breath sounds: No wheezing or rhonchi.  Abdominal:     Tenderness: There is no abdominal tenderness.  Musculoskeletal:        General: No tenderness.     Cervical back: Neck supple.  Skin:    General: Skin is warm.  Neurological:     Comments: Patient awake.  Will look to voice.  However will only minimally follow command.  Will answer some questions but usually with I do not know.  Will move all her extremities but questionable drift on the right leg.  Cannot name a pen in front of her however.  Complete NIH scoring done by neurology.  Psychiatric:     Comments: Patient appears anxious     ED Results / Procedures / Treatments   Labs (all labs ordered are listed, but only abnormal results are displayed) Labs Reviewed  PROTIME-INR - Abnormal; Notable for the following components:      Result Value   Prothrombin Time 16.5 (*)    INR 1.3 (*)    All other components within normal limits  CBC - Abnormal; Notable for the following components:   WBC 14.0 (*)    RBC 5.29 (*)    All other components within normal limits  DIFFERENTIAL - Abnormal; Notable for the following components:   Neutro Abs 8.5 (*)    Lymphs Abs 4.1 (*)    Monocytes Absolute 1.2 (*)    All other components within normal limits  COMPREHENSIVE METABOLIC PANEL - Abnormal; Notable for the following components:   Sodium 131 (*)    Chloride 93 (*)    CO2 21 (*)    Glucose, Bld 321 (*)    Creatinine, Ser 1.69 (*)    GFR calc non Af Amer 28 (*)    GFR calc Af Denyse DagoAmer  32 (*)    Anion gap 17 (*)    All other components within normal limits  I-STAT CHEM 8, ED - Abnormal; Notable for the following components:   Sodium 131 (*)    Chloride 94 (*)    Creatinine, Ser 1.20 (*)     Glucose, Bld 313 (*)    Hemoglobin 16.3 (*)    HCT 48.0 (*)    All other components within normal limits  APTT  CBG MONITORING, ED    EKG EKG Interpretation  Date/Time:  Monday April 21 2019 13:26:12 EST Ventricular Rate:  92 PR Interval:    QRS Duration: 80 QT Interval:  351 QTC Calculation: 435 R Axis:   -3 Text Interpretation: Sinus rhythm Atrial premature complex Low voltage, precordial leads Consider anterior infarct Nonspecific T abnormalities, lateral leads Confirmed by Davonna Belling [id number]) on 04/21/2019 2:27:02 PM   Radiology MR ANGIO HEAD WO CONTRAST  Result Date: 04/21/2019 CLINICAL DATA:  84 year old female with code stroke presentation. History of multifocal advanced intracranial artery stenoses. EXAM: MRI HEAD WITHOUT CONTRAST MRA HEAD WITHOUT CONTRAST TECHNIQUE: Multiplanar, multiecho pulse sequences of the brain and surrounding structures were obtained without intravenous contrast. Angiographic images of the head were obtained using MRA technique without contrast. COMPARISON:  Plain head CT 1238 hours today. Brain MRI and intracranial MRA 10/03/2018 and earlier. FINDINGS: MRI HEAD FINDINGS Brain: Scattered small areas of cortical and white matter restricted diffusion at the posterior left sylvian fissure, most notably series 5, image 76. Additional focal cortical involvement at the left superolateral motor strip on image 89. Mild parietal lobe involvement on image 83. No other vascular territory restricted diffusion. Chronic ischemic disease with encephalomalacia in the left MCA territory, deep gray matter nuclei, brainstem. Additional confluent bilateral cerebral white matter T2 and FLAIR hyperintensity. Chronic blood products in the left deep cerebellar nuclei and brainstem. No midline shift, mass effect, evidence of mass lesion, ventriculomegaly, extra-axial collection or acute intracranial hemorrhage. Cervicomedullary junction and pituitary are within normal  limits. Vascular: Major intracranial vascular flow voids are grossly stable. Skull and upper cervical spine: Negative visible cervical spine. Visualized bone marrow signal is within normal limits. Sinuses/Orbits: Stable. Other: Mastoids remain clear.  Negative scalp soft tissues. MRA HEAD FINDINGS Degraded by motion despite repeated imaging attempts. Preserved antegrade flow signal in the distal vertebral arteries, basilar, and both ICA siphons. Stable appearance of the right PICA. Asymmetric loss of the left MCA M1 segment flow signal, which on comparison with source images from 10/03/2018 appears unchanged and related to chronic severe left M1 stenosis. Asymmetrically decreased left MCA M2 branch flow has not definitely changed when allowing for motion today. The contralateral right M1, right MCA bifurcation and proximal branches appear to remain within normal limits. Chronic asymmetrically decreased signal also in the right ACA A1. Chronic bilateral PCA stenosis was also better demonstrated on the prior study. IMPRESSION: 1. Scattered small acute infarcts in the left MCA territory in the setting of chronic severe left M1 stenosis. No associated hemorrhage or mass effect. Study discussed by telephone with Neurology Dr. Lorraine Lax on 04/21/2019 at 1339 hours. 2. Otherwise stable advanced chronic intracranial ischemic disease. 3. Motion degraded MRA today appears otherwise stable since May this year. Electronically Signed   By: Genevie Ann M.D.   On: 04/21/2019 13:59   MR BRAIN WO CONTRAST  Result Date: 04/21/2019 CLINICAL DATA:  84 year old female with code stroke presentation. History of multifocal advanced intracranial artery stenoses. EXAM: MRI HEAD WITHOUT CONTRAST MRA HEAD  WITHOUT CONTRAST TECHNIQUE: Multiplanar, multiecho pulse sequences of the brain and surrounding structures were obtained without intravenous contrast. Angiographic images of the head were obtained using MRA technique without contrast.  COMPARISON:  Plain head CT 1238 hours today. Brain MRI and intracranial MRA 10/03/2018 and earlier. FINDINGS: MRI HEAD FINDINGS Brain: Scattered small areas of cortical and white matter restricted diffusion at the posterior left sylvian fissure, most notably series 5, image 76. Additional focal cortical involvement at the left superolateral motor strip on image 89. Mild parietal lobe involvement on image 83. No other vascular territory restricted diffusion. Chronic ischemic disease with encephalomalacia in the left MCA territory, deep gray matter nuclei, brainstem. Additional confluent bilateral cerebral white matter T2 and FLAIR hyperintensity. Chronic blood products in the left deep cerebellar nuclei and brainstem. No midline shift, mass effect, evidence of mass lesion, ventriculomegaly, extra-axial collection or acute intracranial hemorrhage. Cervicomedullary junction and pituitary are within normal limits. Vascular: Major intracranial vascular flow voids are grossly stable. Skull and upper cervical spine: Negative visible cervical spine. Visualized bone marrow signal is within normal limits. Sinuses/Orbits: Stable. Other: Mastoids remain clear.  Negative scalp soft tissues. MRA HEAD FINDINGS Degraded by motion despite repeated imaging attempts. Preserved antegrade flow signal in the distal vertebral arteries, basilar, and both ICA siphons. Stable appearance of the right PICA. Asymmetric loss of the left MCA M1 segment flow signal, which on comparison with source images from 10/03/2018 appears unchanged and related to chronic severe left M1 stenosis. Asymmetrically decreased left MCA M2 branch flow has not definitely changed when allowing for motion today. The contralateral right M1, right MCA bifurcation and proximal branches appear to remain within normal limits. Chronic asymmetrically decreased signal also in the right ACA A1. Chronic bilateral PCA stenosis was also better demonstrated on the prior study.  IMPRESSION: 1. Scattered small acute infarcts in the left MCA territory in the setting of chronic severe left M1 stenosis. No associated hemorrhage or mass effect. Study discussed by telephone with Neurology Dr. Laurence Slate on 04/21/2019 at 1339 hours. 2. Otherwise stable advanced chronic intracranial ischemic disease. 3. Motion degraded MRA today appears otherwise stable since May this year. Electronically Signed   By: Odessa Fleming M.D.   On: 04/21/2019 13:59   CT HEAD CODE STROKE WO CONTRAST  Result Date: 04/21/2019 CLINICAL DATA:  Code stroke. Focal neuro deficit, greater than 6 hours, stroke suspected. EXAM: CT HEAD WITHOUT CONTRAST TECHNIQUE: Contiguous axial images were obtained from the base of the skull through the vertex without intravenous contrast. COMPARISON:  Head CT 03/16/2019 FINDINGS: Brain: Mildly motion degraded examination. No evidence of acute intracranial hemorrhage or acute demarcated cortical infarction. Redemonstrated chronic cortical/subcortical infarcts within the occipital lobes (more prominent on the left). Stable generalized parenchymal atrophy and chronic small vessel ischemic disease. Redemonstrated chronic lacunar infarcts within the bilateral basal ganglia and thalami. A now chronic infarct within the paramedian right pons was better appreciated on prior brain MRI. No evidence of intracranial mass. No midline shift or extra-axial fluid collection. Vascular: No hyperdense vessel.  Atherosclerotic calcifications. Skull: Normal. Negative for fracture or focal lesion. Sinuses/Orbits: Visualized orbits demonstrate no acute abnormality. Mild mucosal thickening within the sphenoid sinuses (greater on the right). Polypoid mucosal thickening within the inferior left maxillary sinus. No significant mastoid effusion These results were communicated to Dr. Laurence Slate At 12:50 pmon 12/14/2020by text page via the [facility] messaging system. IMPRESSION: No CT evidence of acute intracranial abnormality.  Redemonstrated chronic infarcts within bilateral occipital lobes. Stable generalized parenchymal atrophy  and chronic small vessel ischemic disease. Redemonstrated chronic lacunar infarcts in the bilateral basal ganglia, thalami and pons. Electronically Signed   By: Jackey Loge DO   On: 04/21/2019 12:52    Procedures Procedures (including critical care time)  Medications Ordered in ED Medications  sodium chloride flush (NS) 0.9 % injection 3 mL (has no administration in time range)  LORazepam (ATIVAN) 2 MG/ML injection (has no administration in time range)    ED Course  I have reviewed the triage vital signs and the nursing notes.  Pertinent labs & imaging results that were available during my care of the patient were reviewed by me and considered in my medical decision making (see chart for details).    MDM Rules/Calculators/A&P                      Patient came in as a code stroke.  Seen by myself and neurology.  Head CT done and reassuring but MRI done due to a contrast allergy.  Does have some acute strokes but not TPA candidate due to being on anticoagulation already.  Will admit to internal medicine.  CRITICAL CARE Performed by: Benjiman Core Total critical care time: 30 minutes Critical care time was exclusive of separately billable procedures and treating other patients. Critical care was necessary to treat or prevent imminent or life-threatening deterioration. Critical care was time spent personally by me on the following activities: development of treatment plan with patient and/or surrogate as well as nursing, discussions with consultants, evaluation of patient's response to treatment, examination of patient, obtaining history from patient or surrogate, ordering and performing treatments and interventions, ordering and review of laboratory studies, ordering and review of radiographic studies, pulse oximetry and re-evaluation of patient's condition.    Final Clinical  Impression(s) / ED Diagnoses Final diagnoses:  Cerebrovascular accident (CVA), unspecified mechanism [facility])    Rx / DC Orders ED Discharge Orders    None       Benjiman Core, MD 04/21/19 1427

## 2019-04-22 ENCOUNTER — Inpatient Hospital Stay (HOSPITAL_COMMUNITY): Payer: Medicare Other

## 2019-04-22 ENCOUNTER — Telehealth: Payer: Self-pay

## 2019-04-22 DIAGNOSIS — E876 Hypokalemia: Secondary | ICD-10-CM

## 2019-04-22 DIAGNOSIS — I639 Cerebral infarction, unspecified: Secondary | ICD-10-CM

## 2019-04-22 LAB — LIPID PANEL
Cholesterol: 163 mg/dL (ref 0–200)
HDL: 25 mg/dL — ABNORMAL LOW (ref >40)
LDL Cholesterol: 95 mg/dL (ref 0–99)
Total CHOL/HDL Ratio: 6.5 RATIO
Triglycerides: 215 mg/dL — ABNORMAL HIGH (ref <150)
VLDL: 43 mg/dL — ABNORMAL HIGH (ref 0–40)

## 2019-04-22 LAB — CBC WITH DIFFERENTIAL/PLATELET
Abs Immature Granulocytes: 0.04 10*3/uL (ref 0.00–0.07)
Basophils Absolute: 0.1 10*3/uL (ref 0.0–0.1)
Basophils Relative: 1 %
Eosinophils Absolute: 0.2 10*3/uL (ref 0.0–0.5)
Eosinophils Relative: 2 %
HCT: 46.5 % — ABNORMAL HIGH (ref 36.0–46.0)
Hemoglobin: 15.3 g/dL — ABNORMAL HIGH (ref 12.0–15.0)
Immature Granulocytes: 0 %
Lymphocytes Relative: 36 %
Lymphs Abs: 4.3 10*3/uL — ABNORMAL HIGH (ref 0.7–4.0)
MCH: 27.1 pg (ref 26.0–34.0)
MCHC: 32.9 g/dL (ref 30.0–36.0)
MCV: 82.4 fL (ref 80.0–100.0)
Monocytes Absolute: 1.1 10*3/uL — ABNORMAL HIGH (ref 0.1–1.0)
Monocytes Relative: 9 %
Neutro Abs: 6.2 10*3/uL (ref 1.7–7.7)
Neutrophils Relative %: 52 %
Platelets: 343 10*3/uL (ref 150–400)
RBC: 5.64 MIL/uL — ABNORMAL HIGH (ref 3.87–5.11)
RDW: 14.6 % (ref 11.5–15.5)
WBC: 11.9 10*3/uL — ABNORMAL HIGH (ref 4.0–10.5)
nRBC: 0 % (ref 0.0–0.2)

## 2019-04-22 LAB — GLUCOSE, CAPILLARY
Glucose-Capillary: 245 mg/dL — ABNORMAL HIGH (ref 70–99)
Glucose-Capillary: 347 mg/dL — ABNORMAL HIGH (ref 70–99)
Glucose-Capillary: 266 mg/dL — ABNORMAL HIGH (ref 70–99)
Glucose-Capillary: 404 mg/dL — ABNORMAL HIGH (ref 70–99)
Glucose-Capillary: 423 mg/dL — ABNORMAL HIGH (ref 70–99)
Glucose-Capillary: 408 mg/dL — ABNORMAL HIGH (ref 70–99)
Glucose-Capillary: 342 mg/dL — ABNORMAL HIGH (ref 70–99)

## 2019-04-22 LAB — BASIC METABOLIC PANEL
Anion gap: 15 (ref 5–15)
BUN: 17 mg/dL (ref 8–23)
CO2: 21 mmol/L — ABNORMAL LOW (ref 22–32)
Calcium: 9.7 mg/dL (ref 8.9–10.3)
Chloride: 98 mmol/L (ref 98–111)
Creatinine, Ser: 1.22 mg/dL — ABNORMAL HIGH (ref 0.44–1.00)
GFR calc Af Amer: 48 mL/min — ABNORMAL LOW (ref >60)
GFR calc non Af Amer: 42 mL/min — ABNORMAL LOW (ref >60)
Glucose, Bld: 338 mg/dL — ABNORMAL HIGH (ref 70–99)
Potassium: 3.2 mmol/L — ABNORMAL LOW (ref 3.5–5.1)
Sodium: 134 mmol/L — ABNORMAL LOW (ref 135–145)

## 2019-04-22 LAB — HEMOGLOBIN A1C
Hgb A1c MFr Bld: 14.1 % — ABNORMAL HIGH (ref 4.8–5.6)
Mean Plasma Glucose: 357.97 mg/dL

## 2019-04-22 MED ORDER — INSULIN DETEMIR 100 UNIT/ML ~~LOC~~ SOLN
20.0000 [IU] | Freq: Two times a day (BID) | SUBCUTANEOUS | Status: DC
  Administered 2019-04-22: 20 [IU] via SUBCUTANEOUS
  Filled 2019-04-22 (×3): qty 0.2

## 2019-04-22 MED ORDER — INSULIN DETEMIR 100 UNIT/ML ~~LOC~~ SOLN
20.0000 [IU] | Freq: Two times a day (BID) | SUBCUTANEOUS | Status: DC
  Filled 2019-04-22: qty 0.2

## 2019-04-22 MED ORDER — INSULIN ASPART 100 UNIT/ML ~~LOC~~ SOLN
4.0000 [IU] | Freq: Three times a day (TID) | SUBCUTANEOUS | Status: DC
  Administered 2019-04-22 – 2019-04-24 (×6): 4 [IU] via SUBCUTANEOUS

## 2019-04-22 NOTE — Evaluation (Signed)
Occupational Therapy Evaluation Patient Details Name: Casey MedianKay F Mollenkopf MRN: [id number] DOB: [birth year] Today's Date: 04/22/2019    History of Present Illness Casey Harrell is a 84 y.o. female with medical history significant of hypertension, diabetes mellitus, paroxysmal A. fib on Eliquis, CKD stage IIIb, chronic hyponatremia, history of CVA brought by EMS for the concern of code stroke. MRI brain showed acute left MCA infarcts.  MR angiogram demonstrated a left M1 occlusion versus stenosis, likely chronic as noted in prior MR angiograms.   Clinical Impression   PTA pt living at ILF, reports assist with bathing and dressing. At time of eval, pt is min A to sit EOB. Upon sitting, pt crying out in pain at purewick. Removed pure wick to find skin tear and abrasions at site. Once removed, RN and NT performed who applied barrier cream. Pt attempted to sit EOB again but could not tolerate due to pain. Further mobility deferred at this time. Pt cognition compromised and she is at times slow to speak. Given current status, recommend pt return to facility with HHOT. Will continue to assess for SNF given eval limited. Will continue to follow per POC listed below.    Follow Up Recommendations  Home health OT;Other (comment)(returning to ILF, vs SNF pending progress)    Equipment Recommendations  None recommended by OT    Recommendations for Other Services       Precautions / Restrictions Precautions Precautions: Fall Restrictions Weight Bearing Restrictions: No      Mobility Bed Mobility Overal bed mobility: Needs Assistance Bed Mobility: Supine to Sit;Rolling Rolling: Min guard   Supine to sit: Min assist     General bed mobility comments: rolling at min guard; able to sit EOB with min A for slight support at trunk to steadying. Pt not able to tolerate sitting 2/2 skin breakdown in peri area  Transfers                 General transfer comment: not tested due to inability to tolerate  sitting balance from peri area skin break down    Balance Overall balance assessment: Needs assistance Sitting-balance support: Bilateral upper extremity supported;Feet supported Sitting balance-Leahy Scale: Fair Sitting balance - Comments: fluctuating min guard- min A; compromised from pain                                   ADL either performed or assessed with clinical judgement   ADL Overall ADL's : Needs assistance/impaired Eating/Feeding: Set up;Sitting   Grooming: Set up;Sitting   Upper Body Bathing: Minimal assistance;Sitting   Lower Body Bathing: Moderate assistance;Sit to/from stand;Sitting/lateral leans   Upper Body Dressing : Minimal assistance;Sitting   Lower Body Dressing: Moderate assistance;Sit to/from stand;Sitting/lateral leans                 General ADL Comments: transfers not completed this date due to increase pain in peri area after removal of purewick and noteable skin damage. pt could not tolerate sitting on bottom     Vision   Vision Assessment?: Vision impaired- to be further tested in functional context Additional Comments: needs to be further tested, was able to track both R and L but needs mor in depth testing     Perception     Praxis      Pertinent Vitals/Pain Pain Assessment: Faces Faces Pain Scale: No hurt Pain Location: peri area Pain Descriptors / Indicators: Crying;Moaning;Aching Pain  Intervention(s): Limited activity within patient's tolerance;Monitored during session;Repositioned     Hand Dominance Right   Extremity/Trunk Assessment Upper Extremity Assessment Upper Extremity Assessment: Generalized weakness   Lower Extremity Assessment Lower Extremity Assessment: Generalized weakness       Communication Communication Communication: Expressive difficulties;Other (comment)(slow to respond)   Cognition Arousal/Alertness: Awake/alert Behavior During Therapy: WFL for tasks assessed/performed Overall  Cognitive Status: Difficult to assess(Pt aphasic and apraxic. Unable to assess cognitive function at this time) Area of Impairment: Orientation;Attention;Memory;Following commands;Safety/judgement;Problem solving;Awareness                 Orientation Level: Disoriented to;Time;Situation Current Attention Level: Focused Memory: Decreased short-term memory Following Commands: Follows one step commands consistently Safety/Judgement: Decreased awareness of safety;Decreased awareness of deficits Awareness: Emergent Problem Solving: Slow processing;Decreased initiation;Difficulty sequencing;Requires verbal cues;Requires tactile cues General Comments: pt presents disoriented to situation and time, needing choices for 50% correct responses. She also has impaired attention but follows 1 step commands consistently   General Comments       Exercises     Shoulder Instructions      Home Living Family/patient expects to be discharged to:: Assisted living   Available Help at Discharge: Family Type of Home: House                           Additional Comments: pt is from [place] With: Son    Prior Functioning/Environment Level of Independence: Independent with assistive device(s)        Comments: pt states she uses RW, and recieves assist for dressing and bathing at ILF        OT Problem List: Decreased strength;Decreased knowledge of use of DME or AE;Decreased range of motion;Decreased knowledge of precautions;Decreased activity tolerance;Decreased cognition;Impaired balance (sitting and/or standing);Decreased safety awareness;Pain      OT Treatment/Interventions: Self-care/ADL training;Therapeutic exercise;Patient/family education;Neuromuscular education;Balance training;Energy conservation;Therapeutic activities;DME and/or AE instruction;Cognitive remediation/compensation    OT Goals(Current goals can be found in the care plan section) Acute Rehab OT Goals Patient  Stated Goal: decrease pain OT Goal Formulation: With patient Time For Goal Achievement: 05/06/19 Potential to Achieve Goals: Good  OT Frequency: Min 2X/week   Barriers to D/C:            Co-evaluation              AM-PAC OT "6 Clicks" Daily Activity     Outcome Measure Help from another person eating meals?: A Little Help from another person taking care of personal grooming?: A Little Help from another person toileting, which includes using toliet, bedpan, or urinal?: A Lot Help from another person bathing (including washing, rinsing, drying)?: A Lot Help from another person to put on and taking off regular upper body clothing?: A Little Help from another person to put on and taking off regular lower body clothing?: A Lot 6 Click Score: 15   End of Session Nurse Communication: Mobility status;Other (comment)(peri area skin break down)  Activity Tolerance: Patient limited by pain Patient left: in bed;with call bell/phone within reach;with nursing/sitter in room  OT Visit Diagnosis: Unsteadiness on feet (R26.81);Other abnormalities of gait and mobility (R26.89);Muscle weakness (generalized) (M62.81);Other symptoms and signs involving cognitive function;Pain Pain - part of body: (peri area)                Time: 7425-9563 OT Time Calculation (min): 22 min Charges:  OT General Charges $OT Visit: 1 Visit OT Evaluation $OT Eval Moderate Complexity:  1 Mod  Dalphine Handing, MSOT, OTR/L Behavioral Health OT/ Acute Relief OT [facility] Office: [phone]  Velera Lansdale 04/22/2019, 1:41 PM

## 2019-04-22 NOTE — Progress Notes (Signed)
STROKE TEAM PROGRESS NOTE   INTERVAL HISTORY Pt sitting in chair for lunch. She still has mild to moderate expressive aphasia and right facial droop with right hand dexterity difficulty. However, RLE strong and symmetrical with LLE. MRI showed left MCA stroke. Her A1C 14.1.   Vitals:   04/21/19 2000 04/22/19 0059 04/22/19 0333 04/22/19 0836  BP: (!) 165/99 (!) 172/60 (!) 172/79 (!) 141/81  Pulse: (!) 101 100 95 94  Resp: 20 18 18 20   Temp: 98.3 F (36.8 C) 97.9 F (36.6 C) 98.9 F (37.2 C) 98.8 F (37.1 C)  TempSrc: Oral Oral Oral Oral  SpO2: 98% 95% 95% 94%    CBC:  Recent Labs  Lab 04/21/19 1230 04/21/19 1231 04/22/19 0328  WBC 14.0*  --  11.9*  NEUTROABS 8.5*  --  6.2  HGB 14.4 16.3* 15.3*  HCT 44.1 48.0* 46.5*  MCV 83.4  --  82.4  PLT 352  --  578    Basic Metabolic Panel:  Recent Labs  Lab 04/21/19 1230 04/21/19 1231 04/22/19 0328  NA 131* 131* 134*  K 3.7 3.6 3.2*  CL 93* 94* 98  CO2 21*  --  21*  GLUCOSE 321* 313* 338*  BUN 20 23 17   CREATININE 1.69* 1.20* 1.22*  CALCIUM 9.8  --  9.7   Lipid Panel:     Component Value Date/Time   CHOL 163 04/22/2019 0328   TRIG 215 (H) 04/22/2019 0328   HDL 25 (L) 04/22/2019 0328   CHOLHDL 6.5 04/22/2019 0328   VLDL 43 (H) 04/22/2019 0328   LDLCALC 95 04/22/2019 0328   HgbA1c:  Lab Results  Component Value Date   HGBA1C 14.1 (H) 04/22/2019   Urine Drug Screen:     Component Value Date/Time   LABOPIA NONE DETECTED 03/29/2017 2222   COCAINSCRNUR NONE DETECTED 03/29/2017 2222   LABBENZ NONE DETECTED 03/29/2017 2222   AMPHETMU NONE DETECTED 03/29/2017 2222   THCU NONE DETECTED 03/29/2017 2222   LABBARB NONE DETECTED 03/29/2017 2222    Alcohol Level     Component Value Date/Time   ETH <10 10/03/2018 2017    IMAGING MR ANGIO HEAD WO CONTRAST  Result Date: 04/21/2019 CLINICAL DATA:  84 year old female with code stroke presentation. History of multifocal advanced intracranial artery stenoses. EXAM: MRI  HEAD WITHOUT CONTRAST MRA HEAD WITHOUT CONTRAST TECHNIQUE: Multiplanar, multiecho pulse sequences of the brain and surrounding structures were obtained without intravenous contrast. Angiographic images of the head were obtained using MRA technique without contrast. COMPARISON:  Plain head CT 1238 hours today. Brain MRI and intracranial MRA 10/03/2018 and earlier. FINDINGS: MRI HEAD FINDINGS Brain: Scattered small areas of cortical and white matter restricted diffusion at the posterior left sylvian fissure, most notably series 5, image 76. Additional focal cortical involvement at the left superolateral motor strip on image 89. Mild parietal lobe involvement on image 83. No other vascular territory restricted diffusion. Chronic ischemic disease with encephalomalacia in the left MCA territory, deep gray matter nuclei, brainstem. Additional confluent bilateral cerebral white matter T2 and FLAIR hyperintensity. Chronic blood products in the left deep cerebellar nuclei and brainstem. No midline shift, mass effect, evidence of mass lesion, ventriculomegaly, extra-axial collection or acute intracranial hemorrhage. Cervicomedullary junction and pituitary are within normal limits. Vascular: Major intracranial vascular flow voids are grossly stable. Skull and upper cervical spine: Negative visible cervical spine. Visualized bone marrow signal is within normal limits. Sinuses/Orbits: Stable. Other: Mastoids remain clear.  Negative scalp soft tissues. MRA HEAD FINDINGS  Degraded by motion despite repeated imaging attempts. Preserved antegrade flow signal in the distal vertebral arteries, basilar, and both ICA siphons. Stable appearance of the right PICA. Asymmetric loss of the left MCA M1 segment flow signal, which on comparison with source images from 10/03/2018 appears unchanged and related to chronic severe left M1 stenosis. Asymmetrically decreased left MCA M2 branch flow has not definitely changed when allowing for motion  today. The contralateral right M1, right MCA bifurcation and proximal branches appear to remain within normal limits. Chronic asymmetrically decreased signal also in the right ACA A1. Chronic bilateral PCA stenosis was also better demonstrated on the prior study. IMPRESSION: 1. Scattered small acute infarcts in the left MCA territory in the setting of chronic severe left M1 stenosis. No associated hemorrhage or mass effect. Study discussed by telephone with Neurology Dr. Laurence Slate on 04/21/2019 at 1339 hours. 2. Otherwise stable advanced chronic intracranial ischemic disease. 3. Motion degraded MRA today appears otherwise stable since May this year. Electronically Signed   By: Odessa Fleming M.D.   On: 04/21/2019 13:59   MR BRAIN WO CONTRAST  Result Date: 04/21/2019 CLINICAL DATA:  84 year old female with code stroke presentation. History of multifocal advanced intracranial artery stenoses. EXAM: MRI HEAD WITHOUT CONTRAST MRA HEAD WITHOUT CONTRAST TECHNIQUE: Multiplanar, multiecho pulse sequences of the brain and surrounding structures were obtained without intravenous contrast. Angiographic images of the head were obtained using MRA technique without contrast. COMPARISON:  Plain head CT 1238 hours today. Brain MRI and intracranial MRA 10/03/2018 and earlier. FINDINGS: MRI HEAD FINDINGS Brain: Scattered small areas of cortical and white matter restricted diffusion at the posterior left sylvian fissure, most notably series 5, image 76. Additional focal cortical involvement at the left superolateral motor strip on image 89. Mild parietal lobe involvement on image 83. No other vascular territory restricted diffusion. Chronic ischemic disease with encephalomalacia in the left MCA territory, deep gray matter nuclei, brainstem. Additional confluent bilateral cerebral white matter T2 and FLAIR hyperintensity. Chronic blood products in the left deep cerebellar nuclei and brainstem. No midline shift, mass effect, evidence of mass  lesion, ventriculomegaly, extra-axial collection or acute intracranial hemorrhage. Cervicomedullary junction and pituitary are within normal limits. Vascular: Major intracranial vascular flow voids are grossly stable. Skull and upper cervical spine: Negative visible cervical spine. Visualized bone marrow signal is within normal limits. Sinuses/Orbits: Stable. Other: Mastoids remain clear.  Negative scalp soft tissues. MRA HEAD FINDINGS Degraded by motion despite repeated imaging attempts. Preserved antegrade flow signal in the distal vertebral arteries, basilar, and both ICA siphons. Stable appearance of the right PICA. Asymmetric loss of the left MCA M1 segment flow signal, which on comparison with source images from 10/03/2018 appears unchanged and related to chronic severe left M1 stenosis. Asymmetrically decreased left MCA M2 branch flow has not definitely changed when allowing for motion today. The contralateral right M1, right MCA bifurcation and proximal branches appear to remain within normal limits. Chronic asymmetrically decreased signal also in the right ACA A1. Chronic bilateral PCA stenosis was also better demonstrated on the prior study. IMPRESSION: 1. Scattered small acute infarcts in the left MCA territory in the setting of chronic severe left M1 stenosis. No associated hemorrhage or mass effect. Study discussed by telephone with Neurology Dr. Laurence Slate on 04/21/2019 at 1339 hours. 2. Otherwise stable advanced chronic intracranial ischemic disease. 3. Motion degraded MRA today appears otherwise stable since May this year. Electronically Signed   By: Odessa Fleming M.D.   On: 04/21/2019 13:59  CT HEAD CODE STROKE WO CONTRAST  Result Date: 04/21/2019 CLINICAL DATA:  Code stroke. Focal neuro deficit, greater than 6 hours, stroke suspected. EXAM: CT HEAD WITHOUT CONTRAST TECHNIQUE: Contiguous axial images were obtained from the base of the skull through the vertex without intravenous contrast. COMPARISON:   Head CT 03/16/2019 FINDINGS: Brain: Mildly motion degraded examination. No evidence of acute intracranial hemorrhage or acute demarcated cortical infarction. Redemonstrated chronic cortical/subcortical infarcts within the occipital lobes (more prominent on the left). Stable generalized parenchymal atrophy and chronic small vessel ischemic disease. Redemonstrated chronic lacunar infarcts within the bilateral basal ganglia and thalami. A now chronic infarct within the paramedian right pons was better appreciated on prior brain MRI. No evidence of intracranial mass. No midline shift or extra-axial fluid collection. Vascular: No hyperdense vessel.  Atherosclerotic calcifications. Skull: Normal. Negative for fracture or focal lesion. Sinuses/Orbits: Visualized orbits demonstrate no acute abnormality. Mild mucosal thickening within the sphenoid sinuses (greater on the right). Polypoid mucosal thickening within the inferior left maxillary sinus. No significant mastoid effusion These results were communicated to Dr. Laurence SlateAroor At 12:50 pmon 12/14/2020by text page via the [facility] messaging system. IMPRESSION: No CT evidence of acute intracranial abnormality. Redemonstrated chronic infarcts within bilateral occipital lobes. Stable generalized parenchymal atrophy and chronic small vessel ischemic disease. Redemonstrated chronic lacunar infarcts in the bilateral basal ganglia, thalami and pons. Electronically Signed   By: Jackey LogeKyle  Golden DO   On: 04/21/2019 12:52    PHYSICAL EXAM  Temp:  [97.9 F (36.6 C)-98.9 F (37.2 C)] 98.8 F (37.1 C) (12/15 0836) Pulse Rate:  [94-101] 94 (12/15 0836) Resp:  [18-20] 20 (12/15 0836) BP: (141-172)/(60-99) 141/81 (12/15 0836) SpO2:  [94 %-98 %] 94 % (12/15 0836)  General - Well nourished, well developed, in no apparent distress.  Ophthalmologic - fundi not visualized due to noncooperation.  Cardiovascular - Regular rhythm and rate, not in afib  Mental Status -  Level of arousal and  orientation to month, age, place, and person were intact, however, not orientated to year. Language exam showed mild to moderate expressive aphasia, frequent word finding difficulty and paraphasic errors, but following all commands, and able to repeat. Naming 2/4.   Cranial Nerves II - XII - II - Visual field intact OU. III, IV, VI - Extraocular movements intact. V - Facial sensation intact bilaterally. VII - right peripheral facial weakness involving upper and lower face. VIII - Hearing & vestibular intact bilaterally. X - Palate elevates symmetrically, mild dysarthria. XI - Chin turning & shoulder shrug intact bilaterally. XII - Tongue protrusion intact.  Motor Strength - The patient's strength was symmetrical in all extremities and pronator drift was absent except right hand dexterity difficulty.  Bulk was normal and fasciculations were absent.   Motor Tone - Muscle tone was assessed at the neck and appendages and was normal.  Reflexes - The patient's reflexes were symmetrical in all extremities and she had no pathological reflexes.  Sensory - Light touch, temperature/pinprick were assessed and were symmetrical.    Coordination - The patient had normal movements in the left hand with no ataxia or dysmetria. However, right FTN showed mild dysmetria.  Tremor was absent.  Gait and Station - deferred.   ASSESSMENT/PLAN Casey Harrell is a 84 y.o. female with history of AF on Eliquis, CVAs, CKD stage III, DB, HTN presenting from SNF with sudden onset speech difficulty (expressive aphasia).   Stroke: scattered left MCA infarct likely due to chronic left MCA stenosis in the  setting of dehydration, secondary to large vessel disease source  Code Stroke CT head No acute abnormality. Small vessel disease. Atrophy. Old B occipital, B basal ganglia, B thalami and pontine infarcts.   MRI / MRA  Scattered L MCA territory infarcts in setting of chronic severe L M1 stenosis. Stable small vessel  disease. Motion degraded  Carotid Doppler unremarkable  2D Echo from 09/2018 EF >65%. No source of embolus   LDL 95  HgbA1c 14.1  Eliquis for VTE prophylaxis  Eliquis (apixaban) daily prior to admission, now on aspirin 81 mg daily and Eliquis (apixaban) daily. Continue on discharge  Therapy recommendations:  HH PT, OT, SLP  Disposition:  Return home  Dehydration and AKI  Cre 1.69->1.2->1.22  Hb 16.3->15.3  On IVF  Encourage po intake  Hx stroke/TIA  10/03/18: Right lacunar pontine infarct, LDL 57 and A1C 9.6. continued on eliquis  03/2017: small L pontine infarct d/t small vessel disease w/ uncontrolled risk factors. Aspirin 81 added and eliquis continued  04/02/15: Extension of left MCA infarction. 30-day cardiac event monitoring showed episodes of paroxysmal A. fib and patient started on eliquis  03/30/15: aphasia and right-sided weakness. Patchy left MCA infarcts, left M1 stenosis, old bilateral occipital lobe infarcts. Embolic source unknown. CUS and TTE unremarkable. EEG no Sz. Put on DAPT.  PAF  Home anticoagulation:  Eliquis (apixaban) daily continued in the hospital . Continue Eliquis (apixaban) daily at discharge   Hypertension  Stable . Permissive hypertension (OK if < 180/105) but gradually normalize in 3-5 days . Long-term BP goal normotensive  Hyperlipidemia  Home meds:  lipitor 20  Now on lipitor 80  LDL 95, goal < 70  TG 215  Continue statin at discharge  Diabetes type II Uncontrolled Diabetic peripheral neuropathy  HgbA1c 14.1, goal < 7.0  CBGs  SSI  Hyperglycemia  Close PCP follow up  DB RN following  Other Stroke Risk Factors  Advanced age  Family hx stroke (Maternal Grandfather)  PVD  Hx CEA 06/2009  Other Active Problems  Hyponatremia Na 131->134  Hypokalemia  Hypothyroidism  Leukocytosis  WBC 14.0->11.9  Hospital day # 1  Neurology will sign off. Please call with questions. Pt will follow up with  stroke clinic NP at [facility] in about 4 weeks. Thanks for the consult.  Marvel Plan, MD PhD Stroke Neurology 04/22/2019 3:45 PM   To contact Stroke Continuity provider, please refer to [email or web address]. After hours, contact General Neurology

## 2019-04-22 NOTE — Evaluation (Signed)
Clinical/Bedside Swallow Evaluation Patient Details  Name: Casey Harrell MRN: [id number] Date of Birth: 05/28/1934  Today's Date: 04/22/2019 Time: SLP Start Time (ACUTE ONLY): 1150 SLP Stop Time (ACUTE ONLY): 1215 SLP Time Calculation (min) (ACUTE ONLY): 25 min  Past Medical History:  Past Medical History:  Diagnosis Date  . ASTHMA 07/15/2008  . COPD (chronic obstructive pulmonary disease) ([place])   . DIABETES-TYPE 2 07/15/2008  . DIABETIC PERIPHERAL NEUROPATHY 09/02/2009  . GERD 07/15/2008  . HYPERTENSION 07/15/2008  . Normal cardiac stress test    low risk Nuc 2009, Feb 2011  . Paroxysmal atrial fibrillation ([facility])   . PVD (peripheral vascular disease) ([place]) 06/18/09   RCE  . Stroke ([place])   . Vertigo    Past Surgical History:  Past Surgical History:  Procedure Laterality Date  . ABDOMINAL HYSTERECTOMY  1971  . APPENDECTOMY  1971  . CAROTID ENDARTERECTOMY  06/18/09   RCE  . CESAREAN SECTION    . colectomy and colostomy    . FLEXIBLE SIGMOIDOSCOPY     diverticulitis  . FOOT SURGERY     right foot X 2  . LAPAROTOMY     X 3 with adhesions lysis  . TONSILLECTOMY    . TUBAL LIGATION     HPI:  84yo female admitted 04/21/2019 with confusion and dysarthria. 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 MRI 04/21/2019 = new left MCA infarcts   Assessment / Plan / Recommendation Clinical Impression  Pt presents with baseline right facial weakness, adequate dentition. Pt accepted trials of thin liquid, puree, and solid textures. Right inattention noted with solid texture, with slight right oral residue observed. No obvious oral difficulty following thin liquids or puree. No s/s aspiration on any consistency tested.   Recommend beginning with dys 2 (finely chopped) and thin liquids with meds whole in puree. Pt is right handed,  and appeared to have difficulty with self feeding using her right hand. Anticipate being able to advance solids at bedside. Safe swallow precautions posted at [facility] and discussed with RN. SLP will follow for diet tolerance and readiness to advance, as well as to provide education to maximize swallow safety.    SLP Visit Diagnosis: Aphasia (R47.01);Apraxia (R48.2)    Aspiration Risk  Mild aspiration risk    Diet Recommendation Dysphagia 2 (Fine chop);Thin liquid   Liquid Administration via: Cup;Straw Medication Administration: Whole meds with puree Supervision: Patient able to self feed;Staff to assist with self feeding;Full supervision/cueing for compensatory strategies Compensations: Minimize environmental distractions;Small sips/bites;Slow rate Postural Changes: Seated upright at 90 degrees;Remain upright for at least 30 minutes after po intake    Other  Recommendations Oral Care Recommendations: Oral care BID   Follow up Recommendations Other (comment)(continued ST intervention recommended at next venue)      Frequency and Duration min 2x/week  2 weeks       Prognosis Prognosis for Safe Diet Advancement: Good Barriers to Reach Goals: Language deficits      Swallow Study   General Date of Onset: 04/21/19 HPI: 84yo female admitted 04/21/2019 with confusion and dysarthria. 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 MRI 04/21/2019 = new left MCA infarcts Type of Study: Bedside Swallow Evaluation Previous Swallow Assessment: 10/09/2018 - no  difficulty identified Diet Prior to this Study: NPO Temperature Spikes Noted: No Respiratory Status: Room air History of Recent Intubation: No Behavior/Cognition: Alert;Cooperative;Pleasant mood;Doesn't follow directions;Requires cueing Oral Cavity Assessment: Within Functional  Limits Oral Care Completed by SLP: No Oral Cavity - Dentition: Adequate natural dentition Vision: Functional for self-feeding Self-Feeding Abilities: Able to feed self;Needs assist;Needs set up Patient Positioning: Upright in bed Baseline Vocal Quality: Normal;Hoarse Volitional Cough: Weak Volitional Swallow: Unable to elicit    Oral/Motor/Sensory Function Overall Oral Motor/Sensory Function: Mild impairment Facial ROM: Reduced right Facial Symmetry: Abnormal symmetry right Facial Strength: Reduced right Facial Sensation: Within Functional Limits Lingual ROM: Within Functional Limits Lingual Symmetry: Within Functional Limits Lingual Strength: Within Functional Limits Lingual Sensation: Within Functional Limits Velum: Within Functional Limits Mandible: Within Functional Limits   Ice Chips Ice chips: Not tested   Thin Liquid Thin Liquid: Within functional limits Presentation: Straw;Self Fed    Nectar Thick Nectar Thick Liquid: Not tested   Honey Thick Honey Thick Liquid: Not tested   Puree Puree: Within functional limits Presentation: Spoon;Self Fed   Solid     Solid: Impaired Presentation: Self Fed Oral Phase Impairments: Impaired mastication Oral Phase Functional Implications: Right lateral sulci pocketing;Right anterior spillage;Prolonged oral transit     [name], MSP, CCC-SLP Speech Language Pathologist Office: [phone] Pager: [phone]  Leigh Aurora 04/22/2019,1:13 PM

## 2019-04-22 NOTE — Telephone Encounter (Signed)
Called daughter Lynelle Smoke and she stated that patient had a stroke yesterday and she is currently in the hospital. Lynelle Smoke is asking for a letter to help her, stating that her mother is prone to stroke and has had a recent stroke, since she is caring for her mom and she is also waiting on [employer] to fax over the paperwork to protect her job. Tammy is concerned that she will loose her job while trying to care for her mother. They are looking for a new facility for her mother for continued care. Not sure if you can do a letter?

## 2019-04-22 NOTE — Progress Notes (Signed)
Inpatient Diabetes Program Recommendations  AACE/ADA: New Consensus Statement on Inpatient Glycemic Control (2015)  Target Ranges:  Prepandial:   less than 140 mg/dL      Peak postprandial:   less than 180 mg/dL (1-2 hours)      Critically ill patients:  140 - 180 mg/dL   Lab Results  Component Value Date   GLUCAP 347 (H) 04/22/2019   HGBA1C 14.1 (H) 04/22/2019    Review of Glycemic Control  Diabetes history: DM2 Outpatient Diabetes medications: Levemir 40 units bid + Humalog 9014 tid meal coverage Current orders for Inpatient glycemic control: Novolog moderate correction tid + hs 0-5  Inpatient Diabetes Program Recommendations:   -While patient is NPO, change Novolog correction scale to sensitive q 4 hrs. -Add Levemir 8 units bid   Thank you, Bethena Roys E. Emilina Smarr, RN, MSN, CDE  Diabetes Coordinator Inpatient Glycemic Control Team Team Pager [phone] (8am-5pm) 04/22/2019 10:43 AM

## 2019-04-22 NOTE — Evaluation (Signed)
Physical Therapy Evaluation Patient Details Name: Casey Harrell MRN: [id number] DOB: [birth year] Today's Date: 04/22/2019   History of Present Illness  Casey Harrell is a 84 y.o. female with medical history significant of hypertension, diabetes mellitus, paroxysmal A. fib on Eliquis, CKD stage IIIb, chronic hyponatremia, history of CVA brought by EMS for the concern of code stroke. MRI brain showed acute left MCA infarcts.  MR angiogram demonstrated a left M1 occlusion versus stenosis, likely chronic as noted in prior MR angiograms.  Clinical Impression  Pt admitted with above diagnosis. Pt transferring and ambulating with min A with RW, largest limitation at this point is expressive aphasia. Pt visibly frustrated with this. Does well when given one word answer choices.  Pt currently with functional limitations due to the deficits listed below (see PT Problem List). Pt will benefit from skilled PT to increase their independence and safety with mobility to allow discharge to the venue listed below.       Follow Up Recommendations Home health PT;Supervision/Assistance - 24 hour    Equipment Recommendations  None recommended by PT    Recommendations for Other Services       Precautions / Restrictions Precautions Precautions: Fall Restrictions Weight Bearing Restrictions: No      Mobility  Bed Mobility Overal bed mobility: Needs Assistance Bed Mobility: Supine to Sit Rolling: Min guard   Supine to sit: Min assist     General bed mobility comments: min HHA to come to EOB, pt able to slide BLE's off bed  Transfers Overall transfer level: Needs assistance Equipment used: Rolling walker (2 wheeled) Transfers: Sit to/from [employer] Sit to Stand: Min assist Stand pivot transfers: Min assist       General transfer comment: pt reports mild dizziness with initial sitting. Min A to pivot to [facility] and to stand from [facility] to RW  Ambulation/Gait Ambulation/Gait assistance:  Min assist Gait Distance (Feet): 20 Feet Assistive device: Rolling walker (2 wheeled) Gait Pattern/deviations: Step-through pattern;Decreased stride length;Trunk flexed Gait velocity: decreased Gait velocity interpretation: <1.31 ft/sec, indicative of household ambulator General Gait Details: one LOB with min A to correct, pt fatigued quickly needing a standing rest break after 12'.   Stairs            Wheelchair Mobility    Modified Rankin (Stroke Patients Only) Modified Rankin (Stroke Patients Only) Pre-Morbid Rankin Score: Moderate disability Modified Rankin: Moderately severe disability     Balance Overall balance assessment: Needs assistance Sitting-balance support: Bilateral upper extremity supported;Feet supported Sitting balance-Leahy Scale: Fair Sitting balance - Comments: able to sit EOB with supervision   Standing balance support: Single extremity supported Standing balance-Leahy Scale: Poor Standing balance comment: reliant on UE support                             Pertinent Vitals/Pain Pain Assessment: No/denies pain Faces Pain Scale: No hurt Pain Location: peri area Pain Descriptors / Indicators: Crying;Moaning;Aching Pain Intervention(s): Limited activity within patient's tolerance;Monitored during session;Repositioned    Home Living Family/patient expects to be discharged to:: Assisted living   Available Help at Discharge: Family Type of Home: House           Additional Comments: pt is from ALF    Prior Function Level of Independence: Independent with assistive device(s)         Comments: pt states she uses RW, and recieves assist for dressing and bathing at ALF  Hand Dominance   Dominant Hand: Right    Extremity/Trunk Assessment   Upper Extremity Assessment Upper Extremity Assessment: Defer to OT evaluation    Lower Extremity Assessment Lower Extremity Assessment: Generalized weakness(R=L)    Cervical / Trunk  Assessment Cervical / Trunk Assessment: Kyphotic  Communication   Communication: Expressive difficulties  Cognition Arousal/Alertness: Lethargic Behavior During Therapy: WFL for tasks assessed/performed Overall Cognitive Status: Difficult to assess(expressive aphasia) Area of Impairment: Orientation;Attention;Memory;Following commands;Safety/judgement;Problem solving;Awareness                 Orientation Level: Disoriented to;Time;Situation Current Attention Level: Focused Memory: Decreased short-term memory Following Commands: Follows one step commands consistently Safety/Judgement: Decreased awareness of safety;Decreased awareness of deficits Awareness: Emergent Problem Solving: Slow processing;Decreased initiation;Difficulty sequencing;Requires verbal cues;Requires tactile cues General Comments: pt does best when given single word answer options instead of open ended questions, pt expresses appropriate frustration with aphasia      General Comments      Exercises     Assessment/Plan    PT Assessment Patient needs continued PT services  PT Problem List Decreased strength;Decreased activity tolerance;Decreased balance;Decreased mobility;Decreased cognition;Decreased knowledge of precautions       PT Treatment Interventions DME instruction;Gait training;Functional mobility training;Therapeutic activities;Therapeutic exercise;Balance training;Patient/family education;Cognitive remediation;Neuromuscular re-education    PT Goals (Current goals can be found in the Care Plan section)  Acute Rehab PT Goals Patient Stated Goal: get better PT Goal Formulation: With patient Time For Goal Achievement: 05/06/19 Potential to Achieve Goals: Good    Frequency Min 4X/week   Barriers to discharge        Co-evaluation               AM-PAC PT "6 Clicks" Mobility  Outcome Measure Help needed turning from your back to your side while in a flat bed without using bedrails?:  None Help needed moving from lying on your back to sitting on the side of a flat bed without using bedrails?: A Little Help needed moving to and from a bed to a chair (including a wheelchair)?: A Little Help needed standing up from a chair using your arms (e.g., wheelchair or bedside chair)?: A Little Help needed to walk in hospital room?: A Little Help needed climbing 3-5 steps with a railing? : A Lot 6 Click Score: 18    End of Session Equipment Utilized During Treatment: Gait belt Activity Tolerance: Patient tolerated treatment well Patient left: in chair;with call bell/phone within reach;with nursing/sitter in room Nurse Communication: Mobility status PT Visit Diagnosis: Unsteadiness on feet (R26.81);Muscle weakness (generalized) (M62.81);Pain;Difficulty in walking, not elsewhere classified (R26.2)    Time: 7829-5621 PT Time Calculation (min) (ACUTE ONLY): 23 min   Charges:   PT Evaluation $PT Eval Moderate Complexity: 1 Mod PT Treatments $Gait Training: 8-22 mins        Lyanne Co, PT  Acute Rehab Services  Pager [phone] Office [phone]   Lawana Chambers Aleric Froelich 04/22/2019, 3:21 PM

## 2019-04-22 NOTE — Progress Notes (Addendum)
PROGRESS NOTE    Casey Harrell   JXB:[id number]  DOB: [birth year]  DOA: 04/21/2019 PCP: Kristian CoveyBurchette, Bruce W, MD   Brief Narrative:  Casey Harrell is a 84 y.o. female who lives in independent living and has a PMH of hypertension, diabetes mellitus, paroxysmal A. fib on Eliquis, CKD stage IIIb, chronic hyponatremia, history of CVA brought by EMS for the concern of code stroke. She was noted to be aphasic   MRI/MRA> Scattered small acute infarcts in the left MCA territory in the setting of chronic severe left M1 stenosis  Subjective: Awake and alert. She has no complaints.   Assessment & Plan:   Principal Problem:   Ischemic stroke in left MCA territory related to chronic severe left M1 stenosis   Hyperlipidemia - now able to speak, alert and oriented but a little slow to respond this AM- has right facial droop and 4/5 right sided weakness - CVA occured while on Eliquis - LDL 215-  - A1c 14.1 - f/u remaining stroke work up-   Active Problems:    DM2 - uncontrolled with hyperglycemia - A1c 14.1 - outpt medications include Levemir, & Humalog which have been resumed at lower doses    Hyponatremia/ AKI - suspect dehydration - continue IVF and follow oral intake today- a diet has been started- follow  Hypokalemia - replace    PAF (paroxysmal atrial fibrillation) - continue Eliquis     Hypothyroidism - cont Synthroid    Essential hypertension - holding Amlodipine to allow for permissive HTN    Leukocytosis - ? If stress response- seem to be improved today- follow for fevers or other signs of infection  Time spent in minutes: 35 DVT prophylaxis: Eliquis Code Status: Full code Family Communication:  Disposition Plan: f/u on CVA work up Consultants:   Neurology Procedures:    Antimicrobials:  Anti-infectives (From admission, onward)   None       Objective: Vitals:   04/21/19 1700 04/21/19 2000 04/22/19 0059 04/22/19 0333  BP: (!) 166/92 (!) 165/99 (!) 172/60  (!) 172/79  Pulse: 97 (!) 101 100 95  Resp: 20 20 18 18   Temp: 98.4 F (36.9 C) 98.3 F (36.8 C) 97.9 F (36.6 C) 98.9 F (37.2 C)  TempSrc: Oral Oral Oral Oral  SpO2: 98% 98% 95% 95%    Intake/Output Summary (Last 24 hours) at 04/22/2019 0749 Last data filed at 04/22/2019 [id number] Gross per 24 hour  Intake 300 ml  Output --  Net 300 ml   There were no vitals filed for this visit.  Examination: General exam: Appears comfortable  HEENT: PERRLA, oral mucosa moist, no sclera icterus or thrush Respiratory system: Clear to auscultation. Respiratory effort normal. Cardiovascular system: S1 & S2 heard, RRR.   Gastrointestinal system: Abdomen soft, non-tender, nondistended. Normal bowel sounds. Central nervous system: Alert and oriented. Right facial droop and right sided weakness 4/5 Extremities: No cyanosis, clubbing or edema Skin: No rashes or ulcers Psychiatry:  Mood & affect appropriate.     Data Reviewed: I have personally reviewed following labs and imaging studies  CBC: Recent Labs  Lab 04/21/19 1230 04/21/19 1231 04/22/19 0328  WBC 14.0*  --  11.9*  NEUTROABS 8.5*  --  6.2  HGB 14.4 16.3* 15.3*  HCT 44.1 48.0* 46.5*  MCV 83.4  --  82.4  PLT 352  --  343   Basic Metabolic Panel: Recent Labs  Lab 04/21/19 1230 04/21/19 1231 04/22/19 0328  NA 131* 131* 134*  K  3.7 3.6 3.2*  CL 93* 94* 98  CO2 21*  --  21*  GLUCOSE 321* 313* 338*  BUN CREATININE 1.69* 1.20* 1.22*  CALCIUM 9.8  --  9.7   GFR: CrCl cannot be calculated (Unknown ideal weight.). Liver Function Tests: Recent Labs  Lab 04/21/19 1230  AST 23  ALT 17  ALKPHOS 116  BILITOT 0.6  PROT 7.5  ALBUMIN 3.8   No results for input(s): LIPASE, AMYLASE in the last 168 hours. No results for input(s): AMMONIA in the last 168 hours. Coagulation Profile: Recent Labs  Lab 04/21/19 1230  INR 1.3*   Cardiac Enzymes: No results for input(s): CKTOTAL, CKMB, CKMBINDEX, TROPONINI in the last  168 hours. BNP (last 3 results) No results for input(s): PROBNP in the last 8760 hours. HbA1C: Recent Labs    04/22/19 0328  HGBA1C 14.1*   CBG: Recent Labs  Lab 04/21/19 2137 04/22/19 0559  GLUCAP 299* 347*   Lipid Profile: Recent Labs    04/22/19 0328  CHOL 163  HDL 25*  LDLCALC 95  TRIG 119*  CHOLHDL 6.5   Thyroid Function Tests: No results for input(s): TSH, T4TOTAL, FREET4, T3FREE, THYROIDAB in the last 72 hours. Anemia Panel: No results for input(s): VITAMINB12, FOLATE, FERRITIN, TIBC, IRON, RETICCTPCT in the last 72 hours. Urine analysis:    Component Value Date/Time   COLORURINE STRAW (A) 03/16/2019 1949   APPEARANCEUR CLEAR 03/16/2019 1949   LABSPEC 1.020 03/16/2019 1949   PHURINE 6.0 03/16/2019 1949   GLUCOSEU >=500 (A) 03/16/2019 1949   HGBUR SMALL (A) 03/16/2019 1949   BILIRUBINUR NEGATIVE 03/16/2019 1949   BILIRUBINUR neg 10/15/2017 1527   KETONESUR 5 (A) 03/16/2019 1949   PROTEINUR 30 (A) 03/16/2019 1949   UROBILINOGEN 0.2 10/15/2017 1527   UROBILINOGEN 0.2 11/05/2014 1555   NITRITE NEGATIVE 03/16/2019 1949   LEUKOCYTESUR NEGATIVE 03/16/2019 1949   Sepsis Labs: (procalcitonin:4,lacticidven:4) ) Recent Results (from the past 240 hour(s))  SARS CORONAVIRUS 2 (TAT 6-24 HRS) Nasopharyngeal Nasopharyngeal Swab     Status: None   Collection Time: 04/21/19  3:27 PM   Specimen: Nasopharyngeal Swab  Result Value Ref Range Status   SARS Coronavirus 2 NEGATIVE NEGATIVE Final    Comment: (NOTE) SARS-CoV-2 target nucleic acids are NOT DETECTED. The SARS-CoV-2 RNA is generally detectable in upper and lower respiratory specimens during the acute phase of infection. Negative results do not preclude SARS-CoV-2 infection, do not rule out co-infections with other pathogens, and should not be used as the sole basis for treatment or other patient management decisions. Negative results must be combined with clinical observations, patient history, and  epidemiological information. The expected result is Negative. Fact Sheet for Patients: HairSlick.no Fact Sheet for Healthcare Providers: quierodirigir.com This test is not yet approved or cleared by the [place] FDA and  has been authorized for detection and/or diagnosis of SARS-CoV-2 by FDA under an Emergency Use Authorization (EUA). This EUA will remain  in effect (meaning this test can be used) for the duration of the COVID-19 declaration under Section 56 4(b)(1) of the Act, 21 U.S.C. section 360bbb-3(b)(1), unless the authorization is terminated or revoked sooner. Performed at [facility], [address] [place], [place] [address]          Radiology Studies: MR ANGIO HEAD WO CONTRAST  Result Date: 04/21/2019 CLINICAL DATA:  84 year old female with code stroke presentation. History of multifocal advanced intracranial artery stenoses. EXAM: MRI HEAD WITHOUT CONTRAST MRA HEAD WITHOUT  CONTRAST TECHNIQUE: Multiplanar, multiecho pulse sequences of the brain and surrounding structures were obtained without intravenous contrast. Angiographic images of the head were obtained using MRA technique without contrast. COMPARISON:  Plain head CT 1238 hours today. Brain MRI and intracranial MRA 10/03/2018 and earlier. FINDINGS: MRI HEAD FINDINGS Brain: Scattered small areas of cortical and white matter restricted diffusion at the posterior left sylvian fissure, most notably series 5, image 76. Additional focal cortical involvement at the left superolateral motor strip on image 89. Mild parietal lobe involvement on image 83. No other vascular territory restricted diffusion. Chronic ischemic disease with encephalomalacia in the left MCA territory, deep gray matter nuclei, brainstem. Additional confluent bilateral cerebral white matter T2 and FLAIR hyperintensity. Chronic blood products in the left deep cerebellar nuclei and brainstem. No  midline shift, mass effect, evidence of mass lesion, ventriculomegaly, extra-axial collection or acute intracranial hemorrhage. Cervicomedullary junction and pituitary are within normal limits. Vascular: Major intracranial vascular flow voids are grossly stable. Skull and upper cervical spine: Negative visible cervical spine. Visualized bone marrow signal is within normal limits. Sinuses/Orbits: Stable. Other: Mastoids remain clear.  Negative scalp soft tissues. MRA HEAD FINDINGS Degraded by motion despite repeated imaging attempts. Preserved antegrade flow signal in the distal vertebral arteries, basilar, and both ICA siphons. Stable appearance of the right PICA. Asymmetric loss of the left MCA M1 segment flow signal, which on comparison with source images from 10/03/2018 appears unchanged and related to chronic severe left M1 stenosis. Asymmetrically decreased left MCA M2 branch flow has not definitely changed when allowing for motion today. The contralateral right M1, right MCA bifurcation and proximal branches appear to remain within normal limits. Chronic asymmetrically decreased signal also in the right ACA A1. Chronic bilateral PCA stenosis was also better demonstrated on the prior study. IMPRESSION: 1. Scattered small acute infarcts in the left MCA territory in the setting of chronic severe left M1 stenosis. No associated hemorrhage or mass effect. Study discussed by telephone with Neurology Dr. Lorraine Lax on 04/21/2019 at 1339 hours. 2. Otherwise stable advanced chronic intracranial ischemic disease. 3. Motion degraded MRA today appears otherwise stable since May this year. Electronically Signed   By: Genevie Ann M.D.   On: 04/21/2019 13:59   MR BRAIN WO CONTRAST  Result Date: 04/21/2019 CLINICAL DATA:  84 year old female with code stroke presentation. History of multifocal advanced intracranial artery stenoses. EXAM: MRI HEAD WITHOUT CONTRAST MRA HEAD WITHOUT CONTRAST TECHNIQUE: Multiplanar, multiecho pulse  sequences of the brain and surrounding structures were obtained without intravenous contrast. Angiographic images of the head were obtained using MRA technique without contrast. COMPARISON:  Plain head CT 1238 hours today. Brain MRI and intracranial MRA 10/03/2018 and earlier. FINDINGS: MRI HEAD FINDINGS Brain: Scattered small areas of cortical and white matter restricted diffusion at the posterior left sylvian fissure, most notably series 5, image 76. Additional focal cortical involvement at the left superolateral motor strip on image 89. Mild parietal lobe involvement on image 83. No other vascular territory restricted diffusion. Chronic ischemic disease with encephalomalacia in the left MCA territory, deep gray matter nuclei, brainstem. Additional confluent bilateral cerebral white matter T2 and FLAIR hyperintensity. Chronic blood products in the left deep cerebellar nuclei and brainstem. No midline shift, mass effect, evidence of mass lesion, ventriculomegaly, extra-axial collection or acute intracranial hemorrhage. Cervicomedullary junction and pituitary are within normal limits. Vascular: Major intracranial vascular flow voids are grossly stable. Skull and upper cervical spine: Negative visible cervical spine. Visualized bone marrow signal is within normal limits.  Sinuses/Orbits: Stable. Other: Mastoids remain clear.  Negative scalp soft tissues. MRA HEAD FINDINGS Degraded by motion despite repeated imaging attempts. Preserved antegrade flow signal in the distal vertebral arteries, basilar, and both ICA siphons. Stable appearance of the right PICA. Asymmetric loss of the left MCA M1 segment flow signal, which on comparison with source images from 10/03/2018 appears unchanged and related to chronic severe left M1 stenosis. Asymmetrically decreased left MCA M2 branch flow has not definitely changed when allowing for motion today. The contralateral right M1, right MCA bifurcation and proximal branches appear to  remain within normal limits. Chronic asymmetrically decreased signal also in the right ACA A1. Chronic bilateral PCA stenosis was also better demonstrated on the prior study. IMPRESSION: 1. Scattered small acute infarcts in the left MCA territory in the setting of chronic severe left M1 stenosis. No associated hemorrhage or mass effect. Study discussed by telephone with Neurology Dr. Laurence Slate on 04/21/2019 at 1339 hours. 2. Otherwise stable advanced chronic intracranial ischemic disease. 3. Motion degraded MRA today appears otherwise stable since May this year. Electronically Signed   By: Odessa Fleming M.D.   On: 04/21/2019 13:59   CT HEAD CODE STROKE WO CONTRAST  Result Date: 04/21/2019 CLINICAL DATA:  Code stroke. Focal neuro deficit, greater than 6 hours, stroke suspected. EXAM: CT HEAD WITHOUT CONTRAST TECHNIQUE: Contiguous axial images were obtained from the base of the skull through the vertex without intravenous contrast. COMPARISON:  Head CT 03/16/2019 FINDINGS: Brain: Mildly motion degraded examination. No evidence of acute intracranial hemorrhage or acute demarcated cortical infarction. Redemonstrated chronic cortical/subcortical infarcts within the occipital lobes (more prominent on the left). Stable generalized parenchymal atrophy and chronic small vessel ischemic disease. Redemonstrated chronic lacunar infarcts within the bilateral basal ganglia and thalami. A now chronic infarct within the paramedian right pons was better appreciated on prior brain MRI. No evidence of intracranial mass. No midline shift or extra-axial fluid collection. Vascular: No hyperdense vessel.  Atherosclerotic calcifications. Skull: Normal. Negative for fracture or focal lesion. Sinuses/Orbits: Visualized orbits demonstrate no acute abnormality. Mild mucosal thickening within the sphenoid sinuses (greater on the right). Polypoid mucosal thickening within the inferior left maxillary sinus. No significant mastoid effusion These  results were communicated to Dr. Laurence Slate At 12:50 pmon 12/14/2020by text page via the [facility] messaging system. IMPRESSION: No CT evidence of acute intracranial abnormality. Redemonstrated chronic infarcts within bilateral occipital lobes. Stable generalized parenchymal atrophy and chronic small vessel ischemic disease. Redemonstrated chronic lacunar infarcts in the bilateral basal ganglia, thalami and pons. Electronically Signed   By: Jackey Loge DO   On: 04/21/2019 12:52      Scheduled Meds: .  stroke: mapping our early stages of recovery book   Does not apply Once  . apixaban  5 mg Oral BID  . aspirin EC  81 mg Oral Daily  . aspirin  300 mg Rectal Once  . atorvastatin  80 mg Oral q1800  . DULoxetine  30 mg Oral BID  . insulin aspart  0-15 Units Subcutaneous TID WC  . insulin aspart  0-5 Units Subcutaneous QHS  . levothyroxine  50 mcg Oral QAC breakfast  . pantoprazole  40 mg Oral BID  . sodium chloride flush  3 mL Intravenous Once   Continuous Infusions: . sodium chloride 75 mL/hr at 04/22/19 0000     LOS: 1 day      Calvert Cantor, MD [facility] Pager: [email or web address] Password [facility] 04/22/2019, 7:49 AM

## 2019-04-22 NOTE — Telephone Encounter (Signed)
Copied from [place] [id number]. Topic: General - Inquiry >> Apr 22, 2019 10:18 AM Richardo Priest, NT wrote: Reason for CRM: Patient's daughter called in stating she would like to speak with nurse of PCP to discuss pt. Please advise.

## 2019-04-22 NOTE — Progress Notes (Signed)
Carotid duplex  has been completed. Refer to [facility] under chart review to view preliminary results.   04/22/2019  2:46 PM Johne Buckle, Bonnye Fava

## 2019-04-22 NOTE — Telephone Encounter (Signed)
What we need to do is get FMLA papers from her employer and I can complete those.

## 2019-04-22 NOTE — Evaluation (Signed)
Speech Language Pathology Evaluation Patient Details Name: Casey Harrell F Mings MRN: [id number] DOB: [birth year] Today's Date: 04/22/2019 Time: 1120-1150 SLP Time Calculation (min) (ACUTE ONLY): 30 min  Problem List:  Patient Active Problem List   Diagnosis Date Noted  . Ischemic stroke (HCC) 04/21/2019  . Labile blood pressure   . Labile blood glucose   . AKI (acute kidney injury) (HCC)   . Diabetes mellitus type 2 in nonobese (HCC)   . PAF (paroxysmal atrial fibrillation) (HCC)   . Reactive hypertension   . Right pontine CVA (HCC) 10/08/2018  . COPD (chronic obstructive pulmonary disease) (HCC) 10/03/2018  . 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 Left pontine cerebrovascular accident (HCC) 04/03/2017  . History of CVA with residual deficit   . DM (diabetes mellitus) (HCC)   . Anemia 03/30/2017  . History of stroke   . Chest pain 02/09/2016  . BPPV (benign paroxysmal positional vertigo) 08/25/2015  . HLD (hyperlipidemia) 08/25/2015  . At high risk for falls 07/01/2015  . Vertigo 06/21/2015  . Paroxysmal atrial fibrillation (HCC) 06/11/2015  . Cerebral infarction due to stenosis of left middle cerebral artery (HCC) 05/20/2015  . Intracranial vascular stenosis 05/20/2015  . Type 2 diabetes mellitus with circulatory disorder (HCC) 05/20/2015  . Type 2 diabetes, uncontrolled, with neuropathy (HCC)   . Abdominal pain   . Partial small bowel obstruction (HCC)   . Benign essential HTN   . Small bowel obstruction (HCC) 05/07/2015  . Hemiplegia, unspecified affecting right dominant side (HCC) 04/08/2015  . Embolic stroke involving posterior cerebral artery (HCC) 04/05/2015  . Orthostatic hypotension   . Hypertensive urgency 04/01/2015  . Fever in adult 04/01/2015  . Acute encephalopathy 04/01/2015  . Confusion   . CVA (cerebral infarction)   . Posterior circulation stroke (HCC)   . Acute ischemic stroke (HCC) 03/31/2015  . Stroke (HCC) 03/31/2015  . CKD (chronic  kidney disease) stage 3, GFR 30-59 ml/min 04/17/2014  . Gastroenteritis, non-infectious 04/11/2014  . Nausea vomiting and diarrhea 04/10/2014  . Hypokalemia 04/10/2014  . Dehydration 04/09/2014  . Bronchitis with airway obstruction (HCC) 02/03/2013  . Leukocytosis 02/03/2013  . Fever 02/03/2013  . Acute respiratory failure with hypoxia (HCC) 02/03/2013  . Depression 02/03/2013  . Hyponatremia 02/03/2013  . Obesity (BMI 30-39.9) 01/10/2013  . PVD (peripheral vascular disease)- S/P RCE 06/18/09 12/13/2012  . Sleep apnea- C-pap intol 12/13/2012  . Low risk cardiac nuclear stress test- 2009 and Feb 2011 12/13/2012  . Mild persistent asthma 11/29/2011  . Chronic cough 06/05/2011  . Diabetic neuropathy (HCC) 09/02/2009  . Dyslipidemia 07/13/2009  . Hypothyroidism 11/12/2008  . Type 2 diabetes mellitus, uncontrolled (HCC) 07/15/2008  . Essential hypertension 07/15/2008  . GERD 07/15/2008  . DIVERTICULOSIS, COLON 07/15/2008   Past Medical History:  Past Medical History:  Diagnosis Date  . ASTHMA 07/15/2008  . COPD (chronic obstructive pulmonary disease) (HCC)   . DIABETES-TYPE 2 07/15/2008  . DIABETIC PERIPHERAL NEUROPATHY 09/02/2009  . GERD 07/15/2008  . HYPERTENSION 07/15/2008  . Normal cardiac stress test    low risk Nuc 2009, Feb 2011  . Paroxysmal atrial fibrillation (HCC)   . PVD (peripheral vascular disease) (HCC) 06/18/09   RCE  . Stroke (HCC)   . Vertigo    Past Surgical History:  Past Surgical History:  Procedure Laterality Date  . ABDOMINAL HYSTERECTOMY  1971  . APPENDECTOMY  1971  . CAROTID ENDARTERECTOMY  06/18/09   RCE  . CESAREAN SECTION    . colectomy and  colostomy    . FLEXIBLE SIGMOIDOSCOPY     diverticulitis  . FOOT SURGERY     right foot X 2  . LAPAROTOMY     X 3 with adhesions lysis  . TONSILLECTOMY    . TUBAL LIGATION     HPI:  84yo female admitted 04/21/2019 with confusion and dysarthria. PMH: asthma, COPD, peripheral neuropathy, GERD, PVD, HTN, DM,  PAFib, CKD3b, chronic hyponatremia,  right lower pontine CVA (May 2020), Left MCA infarcts, old bilateral occipital lobe infarcts (November 2016). MRI 04/21/2019 = new left MCA infarcts   Assessment / Plan / Recommendation Clinical Impression  Pt presents with moderate receptive and expressive aphasia, as well as oral and verbal apraxia. Right side facial weakness is noted, however, chart review of previous speech therapy notes indicate this is baseline. Motor speech and language issues appear to be new. SLP evaluations in May and June 2020 indicate functional cognitive linguistic status. Receptively, pt is able to answer only simple yes/no questions accurately. She follows 1-step commands fairly well, however, she benefits from visual cues. Pt was unable to demonstrate comprehension of sentence length written direction. Expressively, pt struggles with automatic sequences with frequent perseverations. She is able to repeat single syllable words, but accuracy decreases significantly with 2+ syllable words. She was able to name most objects around her room, and answer responsive naming questions accurately, but sentence completion was only 50% accurate. She is aware of her errors, and is frequently upset by them. Pt also exhibits oral and likely verbal apraxia, with groping and inconsistent responses to requests for simple directions (smile, blow a kiss, lick your lips, etc) Continued ST intervention is recommended to maximize communicative effectiveness.    SLP Assessment  SLP Recommendation/Assessment: Patient needs continued Speech Language Pathology Services SLP Visit Diagnosis: Aphasia (R47.01);Apraxia (R48.2)    Follow Up Recommendations  Recommend continued ST intervention at next venue   Frequency and Duration min 2x/week  2 weeks      SLP Evaluation Cognition  Overall Cognitive Status: Difficult to assess(Pt aphasic and apraxic. Unable to assess cognitive function at this  time) Arousal/Alertness: Awake/alert       Comprehension  Auditory Comprehension Overall Auditory Comprehension: Impaired Yes/No Questions: Impaired Basic Biographical Questions: 76-100% accurate Basic Immediate Environment Questions: 75-100% accurate Complex Questions: 25-49% accurate Commands: Impaired One Step Basic Commands: 75-100% accurate Two Step Basic Commands: 25-49% accurate Conversation: Simple Reading Comprehension Reading Status: Impaired Sentence Level: Impaired    Expression Expression Primary Mode of Expression: Verbal Verbal Expression Overall Verbal Expression: Impaired Initiation: No impairment Automatic Speech: Name Level of Generative/Spontaneous Verbalization: Phrase Repetition: Impaired Level of Impairment: Word level Naming: Impairment Responsive: 51-75% accurate Confrontation: Impaired Convergent: 50-74% accurate Divergent: 50-74% accurate Verbal Errors: Perseveration;Aware of errors;Phonemic paraphasias Pragmatics: No impairment Written Expression Dominant Hand: Right Written Expression: Not tested   Oral / Motor  Oral Motor/Sensory Function Overall Oral Motor/Sensory Function: Mild impairment Facial ROM: Reduced right Facial Symmetry: Abnormal symmetry right Facial Strength: Reduced right Facial Sensation: Within Functional Limits Lingual ROM: Within Functional Limits Lingual Symmetry: Within Functional Limits Lingual Strength: Within Functional Limits Lingual Sensation: Within Functional Limits Velum: Within Functional Limits Mandible: Within Functional Limits Motor Speech Overall Motor Speech: Appears within functional limits for tasks assessed Respiration: Within functional limits Phonation: Normal Resonance: Within functional limits Articulation: Within functional limitis Intelligibility: Intelligible Motor Planning: Impaired Level of Impairment: Word Motor Speech Errors: Aware   GO  Enriqueta Shutter, [place],  [place] Speech Language Pathologist Office: [phone] Pager: [phone]  Shonna Chock 04/22/2019, 1:00 PM

## 2019-04-23 LAB — GLUCOSE, CAPILLARY
Glucose-Capillary: 284 mg/dL — ABNORMAL HIGH (ref 70–99)
Glucose-Capillary: 244 mg/dL — ABNORMAL HIGH (ref 70–99)
Glucose-Capillary: 209 mg/dL — ABNORMAL HIGH (ref 70–99)
Glucose-Capillary: 224 mg/dL — ABNORMAL HIGH (ref 70–99)
Glucose-Capillary: 326 mg/dL — ABNORMAL HIGH (ref 70–99)

## 2019-04-23 LAB — SARS CORONAVIRUS 2 (TAT 6-24 HRS): SARS Coronavirus 2: NEGATIVE

## 2019-04-23 MED ORDER — AMLODIPINE BESYLATE 5 MG PO TABS
5.0000 mg | ORAL_TABLET | Freq: Every evening | ORAL | Status: DC
  Administered 2019-04-23: 5 mg via ORAL
  Filled 2019-04-23: qty 1

## 2019-04-23 MED ORDER — INSULIN DETEMIR 100 UNIT/ML ~~LOC~~ SOLN
30.0000 [IU] | Freq: Two times a day (BID) | SUBCUTANEOUS | Status: DC
  Administered 2019-04-23 – 2019-04-24 (×3): 30 [IU] via SUBCUTANEOUS
  Filled 2019-04-23 (×4): qty 0.3

## 2019-04-23 NOTE — Discharge Instructions (Signed)

## 2019-04-23 NOTE — TOC Progression Note (Signed)
Transition of Care [facility]) - Progression Note    Patient Details  Name: Casey Harrell MRN: [id number] Date of Birth: August 15, 1934  [facility] CM/SW Contact  Pollie Friar, RN Phone Number: 04/23/2019, 4:34 PM  Clinical Narrative:    Pt has received insurance authorization for SNF rehab at [facility]. Auth ref # [id number] 04/23/2019-04/25/2019 [place] fax: [phone]  Awaiting covid result. Plan to d/c tomorrow to [facility].   Expected Discharge Plan: [place] Barriers to Discharge: Continued Medical Work up  Expected Discharge Plan and Services Expected Discharge Plan: [place] In-house Referral: Clinical Social Work Discharge Planning Services: CM Consult Post Acute Care Choice: [place] Living arrangements for the past 2 months: Apartment, Independent Living Facility([facility])                                       Social Determinants of Health (SDOH) Interventions    Readmission Risk Interventions No flowsheet data found.

## 2019-04-23 NOTE — Progress Notes (Signed)
Physical Therapy Treatment Patient Details Name: Casey Harrell MRN: [id number] DOB: 1935-04-24 Today's Date: 04/23/2019    History of Present Illness Casey Harrell is a 84 y.o. female with medical history significant of hypertension, diabetes mellitus, paroxysmal A. fib on Eliquis, CKD stage IIIb, chronic hyponatremia, history of CVA brought by EMS for the concern of code stroke. MRI brain showed acute left MCA infarcts.  MR angiogram demonstrated a left M1 occlusion versus stenosis, likely chronic as noted in prior MR angiograms.    PT Comments    Pt progressing slowly towards physical therapy goals. Able to ambulate to sink with walker to brush her teeth; experienced loss of balance when turning, requiring maximal assist to correct and return to upright. Presents with decreased activity tolerance, weakness, decreased cognition, expressive aphasia. Will likely need increased assist upon discharge; recommending SNF.     Follow Up Recommendations  Supervision/Assistance - 24 hour;SNF     Equipment Recommendations  None recommended by PT    Recommendations for Other Services       Precautions / Restrictions Precautions Precautions: Fall Restrictions Weight Bearing Restrictions: No    Mobility  Bed Mobility Overal bed mobility: Needs Assistance Bed Mobility: Supine to Sit;Sit to Supine     Supine to sit: Supervision Sit to supine: Supervision   General bed mobility comments: No physical assist required, use of bed rail  Transfers Overall transfer level: Needs assistance Equipment used: Rolling walker (2 wheeled) Transfers: Sit to/from Stand Sit to Stand: Min assist         General transfer comment: minA to stabilize   Ambulation/Gait Ambulation/Gait assistance: Min assist;Max assist Gait Distance (Feet): 6 Feet(6, 6) Assistive device: Rolling walker (2 wheeled) Gait Pattern/deviations: Step-through pattern;Decreased stride length;Trunk flexed Gait velocity:  decreased Gait velocity interpretation: <1.31 ft/sec, indicative of household ambulator General Gait Details: Pt ambulating 6 feet to sink with min assist, when exiting and turning towards right, pt with bilateral knee buckle/collapse, requiring maxA to recover with use of gait belt and chair pulled up behind her to sit. Able to ambulate back to bed after extended seated rest break.   Stairs             Wheelchair Mobility    Modified Rankin (Stroke Patients Only) Modified Rankin (Stroke Patients Only) Pre-Morbid Rankin Score: Moderate disability Modified Rankin: Moderately severe disability     Balance Overall balance assessment: Needs assistance Sitting-balance support: Bilateral upper extremity supported;Feet unsupported Sitting balance-Leahy Scale: Fair     Standing balance support: No upper extremity supported Standing balance-Leahy Scale: Poor Standing balance comment: pt requiring minA for stability with grooming task at sink; one lateral LOB                            Cognition Arousal/Alertness: Awake/alert Behavior During Therapy: WFL for tasks assessed/performed Overall Cognitive Status: Impaired/Different from baseline(expressive aphasia) Area of Impairment: Attention;Memory;Following commands;Safety/judgement;Problem solving;Awareness;Orientation                 Orientation Level: Disoriented to;Time;Situation Current Attention Level: Sustained Memory: Decreased short-term memory Following Commands: Follows one step commands inconsistently Safety/Judgement: Decreased awareness of safety;Decreased awareness of deficits Awareness: Emergent Problem Solving: Slow processing;Decreased initiation;Difficulty sequencing;Requires verbal cues;Requires tactile cues General Comments: Pt able to correctly state day of the week; needs options for correctly stating month and year. Pt following 1 step commands 75% of the time. Has difficulty problem  solving i.e. screwing cap on toothpaste  Exercises General Exercises - Lower Extremity Long Arc Quad: Both;10 reps;Seated Hip Flexion/Marching: Both;10 reps;Seated    General Comments        Pertinent Vitals/Pain Pain Assessment: No/denies pain    Home Living                      Prior Function            PT Goals (current goals can now be found in the care plan section) Acute Rehab PT Goals Patient Stated Goal: get better PT Goal Formulation: With patient Time For Goal Achievement: 05/06/19 Potential to Achieve Goals: Good Progress towards PT goals: Progressing toward goals    Frequency    Min 3X/week      PT Plan Discharge plan needs to be updated    Co-evaluation              AM-PAC PT "6 Clicks" Mobility   Outcome Measure  Help needed turning from your back to your side while in a flat bed without using bedrails?: None Help needed moving from lying on your back to sitting on the side of a flat bed without using bedrails?: A Little Help needed moving to and from a bed to a chair (including a wheelchair)?: A Little Help needed standing up from a chair using your arms (e.g., wheelchair or bedside chair)?: A Little Help needed to walk in hospital room?: A Little Help needed climbing 3-5 steps with a railing? : A Lot 6 Click Score: 18    End of Session Equipment Utilized During Treatment: Gait belt Activity Tolerance: Patient tolerated treatment well Patient left: with call bell/phone within reach;in bed;with bed alarm set Nurse Communication: Mobility status PT Visit Diagnosis: Unsteadiness on feet (R26.81);Muscle weakness (generalized) (M62.81);Pain;Difficulty in walking, not elsewhere classified (R26.2)     Time: 2458-0998 PT Time Calculation (min) (ACUTE ONLY): 18 min  Charges:  $Therapeutic Activity: 8-22 mins                     Ellamae Sia, PT, DPT Acute Rehabilitation Services Pager [phone] Office  [phone]    Willy Eddy 04/23/2019, 11:13 AM

## 2019-04-23 NOTE — NC FL2 (Signed)
[place] MEDICAID FL2 LEVEL OF CARE SCREENING TOOL     IDENTIFICATION  Patient Name: Casey Harrell Birthdate: [birth year] Sex: female Admission Date (Current Location): 04/21/2019  [facility] and [place] Number:  Producer, television/film/video and Address:  [facility] [place], [address], [place], [place] [address]      Provider Number: [id number]  Attending Physician Name and Address:  [place], Alvira Philips, DO  Relative Name and Phone Number:       Current Level of Care: Hospital Recommended Level of Care: Skilled Nursing Facility Prior Approval Number:    Date Approved/Denied:   PASRR Number: [id number]  Discharge Plan: SNF    Current Diagnoses: Patient Active Problem List   Diagnosis Date Noted  . Ischemic stroke (HCC) 04/21/2019  . Labile blood pressure   . Labile blood glucose   . AKI (acute kidney injury) (HCC)   . Diabetes mellitus type 2 in nonobese (HCC)   . PAF (paroxysmal atrial fibrillation) (HCC)   . Reactive hypertension   . Right pontine CVA (HCC) 10/08/2018  . COPD (chronic obstructive pulmonary disease) (HCC) 10/03/2018  . 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 Left pontine cerebrovascular accident (HCC) 04/03/2017  . History of CVA with residual deficit   . DM (diabetes mellitus) (HCC)   . Anemia 03/30/2017  . History of stroke   . Chest pain 02/09/2016  . BPPV (benign paroxysmal positional vertigo) 08/25/2015  . HLD (hyperlipidemia) 08/25/2015  . At high risk for falls 07/01/2015  . Vertigo 06/21/2015  . Paroxysmal atrial fibrillation (HCC) 06/11/2015  . Cerebral infarction due to stenosis of left middle cerebral artery (HCC) 05/20/2015  . Intracranial vascular stenosis 05/20/2015  . Type 2 diabetes mellitus with circulatory disorder (HCC) 05/20/2015  . Type 2 diabetes, uncontrolled, with neuropathy (HCC)   . Abdominal pain   . Partial small bowel obstruction (HCC)   . Benign essential HTN   . Small bowel obstruction  (HCC) 05/07/2015  . Hemiplegia, unspecified affecting right dominant side (HCC) 04/08/2015  . Embolic stroke involving posterior cerebral artery (HCC) 04/05/2015  . Orthostatic hypotension   . Hypertensive urgency 04/01/2015  . Fever in adult 04/01/2015  . Acute encephalopathy 04/01/2015  . Confusion   . CVA (cerebral infarction)   . Posterior circulation stroke (HCC)   . Acute ischemic stroke (HCC) 03/31/2015  . Stroke (HCC) 03/31/2015  . CKD (chronic kidney disease) stage 3, GFR 30-59 ml/min 04/17/2014  . Gastroenteritis, non-infectious 04/11/2014  . Nausea vomiting and diarrhea 04/10/2014  . Hypokalemia 04/10/2014  . Dehydration 04/09/2014  . Bronchitis with airway obstruction (HCC) 02/03/2013  . Leukocytosis 02/03/2013  . Fever 02/03/2013  . Acute respiratory failure with hypoxia (HCC) 02/03/2013  . Depression 02/03/2013  . Hyponatremia 02/03/2013  . Obesity (BMI 30-39.9) 01/10/2013  . PVD (peripheral vascular disease)- S/P RCE 06/18/09 12/13/2012  . Sleep apnea- C-pap intol 12/13/2012  . Low risk cardiac nuclear stress test- 2009 and Feb 2011 12/13/2012  . Mild persistent asthma 11/29/2011  . Chronic cough 06/05/2011  . Diabetic neuropathy (HCC) 09/02/2009  . Dyslipidemia 07/13/2009  . Hypothyroidism 11/12/2008  . Type 2 diabetes mellitus, uncontrolled (HCC) 07/15/2008  . Essential hypertension 07/15/2008  . GERD 07/15/2008  . DIVERTICULOSIS, COLON 07/15/2008    Orientation RESPIRATION BLADDER Height & Weight     Self, Time, Situation, Place  Normal Continent Weight:   Height:     BEHAVIORAL SYMPTOMS/MOOD NEUROLOGICAL BOWEL NUTRITION STATUS      Continent  Diet(Dysphagia 2 with thin liquids)  AMBULATORY STATUS COMMUNICATION OF NEEDS Skin   Extensive Assist Verbally Bruising(Bruising to arms/ MASD to labia)                       Personal Care Assistance Level of Assistance  Bathing, Feeding, Dressing Bathing Assistance: Limited assistance Feeding assistance:  Independent Dressing Assistance: Limited assistance     Functional Limitations Info  Sight, Hearing, Speech Sight Info: Impaired Hearing Info: Adequate Speech Info: Impaired(slur/ dysarthria)    SPECIAL CARE FACTORS FREQUENCY  PT (By licensed PT), OT (By licensed OT), Speech therapy     PT Frequency: 5x/wk OT Frequency: 5x/wk     Speech Therapy Frequency: 5x/wk      Contractures Contractures Info: Not present    Additional Factors Info  Code Status, Allergies, Psychotropic, Insulin Sliding Scale Code Status Info: Full Allergies Info: doxycycline/ morphine/ iohexol/ moxifloxacin Psychotropic Info: [place] Dr 30 mg BID Insulin Sliding Scale Info: Novolog 0-15 units SQ three times a day/ Novolog 4 units SQ with meals/ Novolog 0-5 units SQ at bedtime/ Levemir 30 units SQ twice a day       Current Medications (04/23/2019):  This is the current hospital active medication list Current Facility-Administered Medications  Medication Dose Route Frequency Provider Last Rate Last Admin  .  stroke: mapping our early stages of recovery book   Does not apply Once Pahwani, Rinka R, MD      . 0.9 %  sodium chloride infusion   Intravenous Continuous Pahwani, Rinka R, MD 75 mL/hr at 04/23/19 0400 Rate Verify at 04/23/19 0400  . acetaminophen (TYLENOL) tablet 650 mg  650 mg Oral Q4H PRN Pahwani, Rinka R, MD   650 mg at 04/23/19 1204   Or  . acetaminophen (TYLENOL) 160 MG/5ML solution 650 mg  650 mg Per Tube Q4H PRN Pahwani, Rinka R, MD       Or  . acetaminophen (TYLENOL) suppository 650 mg  650 mg Rectal Q4H PRN Pahwani, Rinka R, MD      . apixaban (ELIQUIS) tablet 5 mg  5 mg Oral BID Pahwani, Rinka R, MD   5 mg at 04/23/19 0928  . aspirin EC tablet 81 mg  81 mg Oral Daily Marvel Plan, MD   81 mg at 04/23/19 1610  . atorvastatin (LIPITOR) tablet 80 mg  80 mg Oral q1800 Pahwani, Rinka R, MD   80 mg at 04/22/19 1722  . diphenhydrAMINE (BENADRYL) injection 12.5 mg  12.5 mg Intravenous QHS PRN  Tobey Grim, NP   12.5 mg at 04/21/19 2119  . DULoxetine (CYMBALTA) DR capsule 30 mg  30 mg Oral BID Pahwani, Rinka R, MD   30 mg at 04/23/19 0928  . insulin aspart (novoLOG) injection 0-15 Units  0-15 Units Subcutaneous TID WC Pahwani, Rinka R, MD   5 Units at 04/23/19 1211  . insulin aspart (novoLOG) injection 0-5 Units  0-5 Units Subcutaneous QHS Pahwani, Rinka R, MD   4 Units at 04/22/19 2236  . insulin aspart (novoLOG) injection 4 Units  4 Units Subcutaneous TID WC Calvert Cantor, MD   4 Units at 04/23/19 1211  . insulin detemir (LEVEMIR) injection 30 Units  30 Units Subcutaneous BID [place], Eric J, DO   30 Units at 04/23/19 0827  . levothyroxine (SYNTHROID) tablet 50 mcg  50 mcg Oral QAC breakfast Pahwani, Rinka R, MD   50 mcg at 04/23/19 [phone]  . pantoprazole (PROTONIX) EC tablet 40 mg  40 mg Oral BID Pahwani, Rinka R, MD   40 mg at 04/23/19 0928  . sodium chloride flush (NS) 0.9 % injection 3 mL  3 mL Intravenous Once Benjiman Core, MD         Discharge Medications: Please see discharge summary for a list of discharge medications.  Relevant Imaging Results:  Relevant Lab Results:   Additional Information SS#: [id number]  Kermit Balo, RN

## 2019-04-23 NOTE — Progress Notes (Signed)
PROGRESS NOTE    Casey Harrell   ZOX:[id number]  DOB: August 26, 1934  DOA: 04/21/2019 PCP: [name], MD   Brief Narrative:  Casey Harrell is a 84 y.o. female who lives in independent living and has a PMH of hypertension, diabetes mellitus, paroxysmal A. fib on Eliquis, CKD stage IIIb, chronic hyponatremia, history of CVA brought by EMS for the concern of code stroke. She was noted to be aphasic   MRI/MRA> Scattered small acute infarcts in the left MCA territory in the setting of chronic severe left M1 stenosis  Subjective: Awake and alert. She has no complaints.   Assessment & Plan:   Ischemic stroke in left MCA territory related to chronic severe left M1 stenosis Patient presenting to the ED via EMS for aphasia.  History of CVA.  CT head on admission with no evidence of acute intracranial abnormality with redemonstration of chronic infarcts within the bilateral occipital lobes, and chronic lacunar infarcts in the bilateral basal ganglia, thalami, pons with stable generalized parenchymal atrophy and chronic small vessel ischemic disease.  MR brain without contrast and MRA with scattered small acute infarcts left MCA territory in the setting of chronic severe left M1 stenosis, no associated hemorrhage or mass-effect.  Carotid Doppler unremarkable.  Hemoglobin A1c 14.1, poorly controlled.  LDL 95, HDL 25, total cholesterol 811, triglycerides 215. --Patient continues to slowly improve with ability to speak, alert, right-sided facial droop and 4/5 right-sided weakness. --Neurology signed off 04/22/2019 --Continue aspirin 81 mg p.o. daily --Eliquis 5 mg p.o. daily --Atorvastatin 80 mg p.o. daily --Continue PT/OT efforts --Pending SNF placement  DM2 Hemoglobin A1c 14.1, poorly controlled.  Home regimen includes Levemir 40u [place] BID, Humalog SSI  --Increase Levemir to 30 units subcutaneously twice daily --Continue insulin sliding scale for further coverage --CBGs qAC/HS --monitor glucose  and adjust accordingly  Hyperlipidemia LDL 95, HDL 25, total cholesterol 914, triglycerides 215. --Atorvastatin 80 mg p.o. daily  Hyponatremia/ AKI Na 131 and creatinine 1.69 on admission, suspect prerenal azotemia/dehydration. --Na 131-->134 --Cr 1.69-->1.2 --continue NS at 80mL/hr until oral intake improves.  Hypokalemia --Repleted during hospitalization --Repeat BMP with magnesium in the a.m.  PAF (paroxysmal atrial fibrillation) --continue Eliquis 5 mg p.o. twice daily  Hypothyroidism --cont Synthroid 50 mcg p.o. daily  Essential hypertension --Resume home amlodipine  Leukocytosis WBC 14.0-->11.9. Likely stress response- seem to be improved today --Continue to monitor clinically for fevers or other signs of infection --Repeat CBC in the a.m.   DVT prophylaxis: Eliquis Code Status: Full code Family Communication:  Disposition Plan: Pending SNF placement Consultants:   Neurology Procedures:    Antimicrobials:  Anti-infectives (From admission, onward)   None       Objective: Vitals:   04/22/19 2319 04/23/19 0307 04/23/19 0758 04/23/19 1201  BP: (!) 132/53 (!) 161/89 (!) 153/67 (!) 113/57  Pulse: 89 93 87 75  Resp: Temp: 97.8 F (36.6 C) 98.2 F (36.8 C) 98 F (36.7 C) 98 F (36.7 C)  TempSrc: Oral Oral Oral Oral  SpO2: 95% 93% 95% 100%    Intake/Output Summary (Last 24 hours) at 04/23/2019 1408 Last data filed at 04/23/2019 0955 Gross per 24 hour  Intake 1713.56 ml  Output 200 ml  Net 1513.56 ml   There were no vitals filed for this visit.  Examination: General exam: Appears comfortable  HEENT: PERRLA, oral mucosa moist, no sclera icterus or thrush Respiratory system: Clear to auscultation. Respiratory effort normal. Cardiovascular system: S1 & S2  heard, RRR.   Gastrointestinal system: Abdomen soft, non-tender, nondistended. Normal bowel sounds. Central nervous system: Alert and oriented. Right facial droop and right sided  weakness 4/5 Extremities: No cyanosis, clubbing or edema Skin: No rashes or ulcers Psychiatry:  Mood & affect appropriate.     Data Reviewed: I have personally reviewed following labs and imaging studies  CBC: Recent Labs  Lab 04/21/19 1230 04/21/19 1231 04/22/19 0328  WBC 14.0*  --  11.9*  NEUTROABS 8.5*  --  6.2  HGB 14.4 16.3* 15.3*  HCT 44.1 48.0* 46.5*  MCV 83.4  --  82.4  PLT 352  --  343   Basic Metabolic Panel: Recent Labs  Lab 04/21/19 1230 04/21/19 1231 04/22/19 0328  NA 131* 131* 134*  K 3.7 3.6 3.2*  CL 93* 94* 98  CO2 21*  --  21*  GLUCOSE 321* 313* 338*  BUN 20 23 17   CREATININE 1.69* 1.20* 1.22*  CALCIUM 9.8  --  9.7   GFR: CrCl cannot be calculated (Unknown ideal weight.). Liver Function Tests: Recent Labs  Lab 04/21/19 1230  AST 23  ALT 17  ALKPHOS 116  BILITOT 0.6  PROT 7.5  ALBUMIN 3.8   No results for input(s): LIPASE, AMYLASE in the last 168 hours. No results for input(s): AMMONIA in the last 168 hours. Coagulation Profile: Recent Labs  Lab 04/21/19 1230  INR 1.3*   Cardiac Enzymes: No results for input(s): CKTOTAL, CKMB, CKMBINDEX, TROPONINI in the last 168 hours. BNP (last 3 results) No results for input(s): PROBNP in the last 8760 hours. HbA1C: Recent Labs    04/22/19 0328  HGBA1C 14.1*   CBG: Recent Labs  Lab 04/22/19 2103 04/22/19 2235 04/23/19 0602 04/23/19 0744 04/23/19 1159  GLUCAP 408* 342* 284* 244* 209*   Lipid Profile: Recent Labs    04/22/19 0328  CHOL 163  HDL 25*  LDLCALC 95  TRIG 04/24/19*  CHOLHDL 6.5   Thyroid Function Tests: No results for input(s): TSH, T4TOTAL, FREET4, T3FREE, THYROIDAB in the last 72 hours. Anemia Panel: No results for input(s): VITAMINB12, FOLATE, FERRITIN, TIBC, IRON, RETICCTPCT in the last 72 hours. Urine analysis:    Component Value Date/Time   COLORURINE STRAW (A) 03/16/2019 1949   APPEARANCEUR CLEAR 03/16/2019 1949   LABSPEC 1.020 03/16/2019 1949   PHURINE 6.0  03/16/2019 1949   GLUCOSEU >=500 (A) 03/16/2019 1949   HGBUR SMALL (A) 03/16/2019 1949   BILIRUBINUR NEGATIVE 03/16/2019 1949   BILIRUBINUR neg 10/15/2017 1527   KETONESUR 5 (A) 03/16/2019 1949   PROTEINUR 30 (A) 03/16/2019 1949   UROBILINOGEN 0.2 10/15/2017 1527   UROBILINOGEN 0.2 11/05/2014 1555   NITRITE NEGATIVE 03/16/2019 1949   LEUKOCYTESUR NEGATIVE 03/16/2019 1949   Sepsis Labs: @LABRCNTIP (procalcitonin:4,lacticidven:4) ) Recent Results (from the past 240 hour(s))  SARS CORONAVIRUS 2 (TAT 6-24 HRS) Nasopharyngeal Nasopharyngeal Swab     Status: None   Collection Time: 04/21/19  3:27 PM   Specimen: Nasopharyngeal Swab  Result Value Ref Range Status   SARS Coronavirus 2 NEGATIVE NEGATIVE Final    Comment: (NOTE) SARS-CoV-2 target nucleic acids are NOT DETECTED. The SARS-CoV-2 RNA is generally detectable in upper and lower respiratory specimens during the acute phase of infection. Negative results do not preclude SARS-CoV-2 infection, do not rule out co-infections with other pathogens, and should not be used as the sole basis for treatment or other patient management decisions. Negative results must be combined with clinical observations, patient history, and epidemiological information. The expected result is  Negative. Fact Sheet for Patients: SugarRoll.be Fact Sheet for Healthcare Providers: https://www.woods-mathews.com/ This test is not yet approved or cleared by the [place] FDA and  has been authorized for detection and/or diagnosis of SARS-CoV-2 by FDA under an Emergency Use Authorization (EUA). This EUA will remain  in effect (meaning this test can be used) for the duration of the COVID-19 declaration under Section 56 4(b)(1) of the Act, 21 U.S.C. section 360bbb-3(b)(1), unless the authorization is terminated or revoked sooner. Performed at [facility], [place] [address], [place], [place] [address]           Radiology Studies: VAS US CAROTID (at [facility] and [facility] only)  Result Date: 04/22/2019 Carotid Arterial Duplex Study Indications:       CVA. Risk Factors:      Hypertension, Diabetes, coronary artery disease. Other Factors:     A-Fib. Comparison Study:  Carotid 10/04/18 - 1-39% bilaterally. Performing Technologist: Oda Cogan RDMS, RVT  Examination Guidelines: A complete evaluation includes B-mode imaging, spectral Doppler, color Doppler, and power Doppler as needed of all accessible portions of each vessel. Bilateral testing is considered an integral part of a complete examination. Limited examinations for reoccurring indications may be performed as noted.  Right Carotid Findings: +----------+--------+--------+--------+------------------+------------------+           PSV cm/sEDV cm/sStenosisPlaque DescriptionComments           +----------+--------+--------+--------+------------------+------------------+ CCA Prox  76      15                                                   +----------+--------+--------+--------+------------------+------------------+ CCA Distal122     22                                intimal thickening +----------+--------+--------+--------+------------------+------------------+ ICA Prox  78      16      1-39%   homogeneous                          +----------+--------+--------+--------+------------------+------------------+ ICA Distal42      17                                                   +----------+--------+--------+--------+------------------+------------------+ ECA       89      11                                                   +----------+--------+--------+--------+------------------+------------------+ +----------+--------+-------+----------------+-------------------+           PSV cm/sEDV cmsDescribe        Arm Pressure (mmHG) +----------+--------+-------+----------------+-------------------+ [id number]             Multiphasic, WNL                    +----------+--------+-------+----------------+-------------------+ +---------+--------+--+--------+--+---------+ VertebralPSV cm/s38EDV cm/s11Antegrade +---------+--------+--+--------+--+---------+  Left Carotid Findings: +----------+--------+--------+--------+------------------+------------------+           PSV cm/sEDV cm/sStenosisPlaque DescriptionComments           +----------+--------+--------+--------+------------------+------------------+  CCA Prox  63      11                                                   +----------+--------+--------+--------+------------------+------------------+ CCA Distal51      9                                 intimal thickening +----------+--------+--------+--------+------------------+------------------+ ICA Prox  58      11      1-39%   heterogenous                         +----------+--------+--------+--------+------------------+------------------+ ICA Distal48      15                                                   +----------+--------+--------+--------+------------------+------------------+ ECA       103     10                                                   +----------+--------+--------+--------+------------------+------------------+ +----------+--------+--------+----------------+-------------------+           PSV cm/sEDV cm/sDescribe        Arm Pressure (mmHG) +----------+--------+--------+----------------+-------------------+ [id number]             Multiphasic, WNL                    +----------+--------+--------+----------------+-------------------+ +---------+--------+--+--------+-+---------+ VertebralPSV cm/s25EDV cm/s7Antegrade +---------+--------+--+--------+-+---------+  Summary: Right Carotid: Velocities in the right ICA are consistent with a 1-39% stenosis. Left Carotid: Velocities in the left ICA are consistent with a 1-39% stenosis. Vertebrals: Bilateral  vertebral arteries demonstrate antegrade flow. *See table(s) above for measurements and observations.  Electronically signed by Waverly Ferrarihristopher Dickson MD on 04/22/2019 at 7:59:46 PM.    Final       Scheduled Meds: .  stroke: mapping our early stages of recovery book   Does not apply Once  . apixaban  5 mg Oral BID  . aspirin EC  81 mg Oral Daily  . atorvastatin  80 mg Oral q1800  . DULoxetine  30 mg Oral BID  . insulin aspart  0-15 Units Subcutaneous TID WC  . insulin aspart  0-5 Units Subcutaneous QHS  . insulin aspart  4 Units Subcutaneous TID WC  . insulin detemir  30 Units Subcutaneous BID  . levothyroxine  50 mcg Oral QAC breakfast  . pantoprazole  40 mg Oral BID  . sodium chloride flush  3 mL Intravenous Once   Continuous Infusions: . sodium chloride 75 mL/hr at 04/23/19 0400     LOS: 2 days    Time spent: 32 minutes spent on chart review, discussion with nursing staff, consultants, updating family and interview/physical exam; more than 50% of that time was spent in counseling and/or coordination of care.  Alvira PhilipsEric J [place], DO [facility] 04/23/2019, 2:08 PM

## 2019-04-23 NOTE — Telephone Encounter (Signed)
Message Routed to PCP CMA 

## 2019-04-23 NOTE — TOC Initial Note (Addendum)
Transition of Care [facility]) - Initial/Assessment Note    Patient Details  Name: Casey Harrell MRN: [id number] Date of Birth: 1934-10-23  [facility]) CM/SW Contact:    Kermit Balo, RN Phone Number: 04/23/2019, 12:55 PM  Clinical Narrative:                 CM spoke to [facility] at [facility] this am and they are not able to provide what patient needs at their facility. Pt would not be able to d/c to ALF d/c unable to afford. CM reached out to son and he is in agreement with SNF rehab with hopes of patient returning to her ILF after rehab. Son asked that pt be faxed out in the [facility] area with concentration on [place] and [place].  TOC following.  Addendum: [facility] has no beds available  Expected Discharge Plan: Skilled Nursing Facility Barriers to Discharge: Continued Medical Work up   Patient Goals and CMS Choice   CMS Medicare.gov Compare Post Acute Care list provided to:: Patient Represenative (must comment) Choice offered to / list presented to : Adult Children(son)  Expected Discharge Plan and Services Expected Discharge Plan: Skilled Nursing Facility In-house Referral: Clinical Social Work Discharge Planning Services: CM Consult Post Acute Care Choice: Skilled Nursing Facility Living arrangements for the past 2 months: Apartment, Independent Living Facility([employer] [facility])                                      Prior Living Arrangements/Services Living arrangements for the past 2 months: Apartment, Independent Living Facility([employer] [facility]) Lives with:: Self Patient language and need for interpreter reviewed:: Yes Do you feel safe going back to the place where you live?: Yes      Need for Family Participation in Patient Care: Yes (Comment) Care giver support system in place?: No (comment)   Criminal Activity/Legal Involvement Pertinent to Current Situation/Hospitalization: No - Comment as needed  Activities of Daily Living       Permission Sought/Granted                  Emotional Assessment Appearance:: Appears stated age         Psych Involvement: No (comment)  Admission diagnosis:  Ischemic stroke (HCC) [I63.9] Cerebrovascular accident (CVA), unspecified mechanism (HCC) [I63.9] Patient Active Problem List   Diagnosis Date Noted  . Ischemic stroke (HCC) 04/21/2019  . Labile blood pressure   . Labile blood glucose   . AKI (acute kidney injury) (HCC)   . Diabetes mellitus type 2 in nonobese (HCC)   . PAF (paroxysmal atrial fibrillation) (HCC)   . Reactive hypertension   . Right pontine CVA (HCC) 10/08/2018  . COPD (chronic obstructive pulmonary disease) (HCC) 10/03/2018  . 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 Left pontine cerebrovascular accident (HCC) 04/03/2017  . History of CVA with residual deficit   . DM (diabetes mellitus) (HCC)   . Anemia 03/30/2017  . History of stroke   . Chest pain 02/09/2016  . BPPV (benign paroxysmal positional vertigo) 08/25/2015  . HLD (hyperlipidemia) 08/25/2015  . At high risk for falls 07/01/2015  . Vertigo 06/21/2015  . Paroxysmal atrial fibrillation (HCC) 06/11/2015  . Cerebral infarction due to stenosis of left middle cerebral artery (HCC) 05/20/2015  . Intracranial vascular stenosis 05/20/2015  . Type 2 diabetes mellitus with circulatory disorder (HCC) 05/20/2015  . Type 2 diabetes, uncontrolled, with  neuropathy ([place])   . Abdominal pain   . Partial small bowel obstruction ([place])   . Benign essential HTN   . Small bowel obstruction ([place]) 05/07/2015  . Hemiplegia, unspecified affecting right dominant side ([place]) 04/08/2015  . Embolic stroke involving posterior cerebral artery ([place]) 04/05/2015  . Orthostatic hypotension   . Hypertensive urgency 04/01/2015  . Fever in adult 04/01/2015  . Acute encephalopathy 04/01/2015  . Confusion   . CVA (cerebral infarction)   . Posterior circulation stroke ([place])   . Acute ischemic stroke  ([place]) 03/31/2015  . Stroke ([place]) 03/31/2015  . CKD (chronic kidney disease) stage 3, GFR 30-59 ml/min 04/17/2014  . Gastroenteritis, non-infectious 04/11/2014  . Nausea vomiting and diarrhea 04/10/2014  . Hypokalemia 04/10/2014  . Dehydration 04/09/2014  . Bronchitis with airway obstruction ([place]) 02/03/2013  . Leukocytosis 02/03/2013  . Fever 02/03/2013  . Acute respiratory failure with hypoxia ([place]) 02/03/2013  . Depression 02/03/2013  . Hyponatremia 02/03/2013  . Obesity (BMI 30-39.9) 01/10/2013  . PVD (peripheral vascular disease)- S/P RCE 06/18/09 12/13/2012  . Sleep apnea- C-pap intol 12/13/2012  . Low risk cardiac nuclear stress test- 2009 and Feb 2011 12/13/2012  . Mild persistent asthma 11/29/2011  . Chronic cough 06/05/2011  . Diabetic neuropathy ([place]) 09/02/2009  . Dyslipidemia 07/13/2009  . Hypothyroidism 11/12/2008  . Type 2 diabetes mellitus, uncontrolled ([place]) 07/15/2008  . Essential hypertension 07/15/2008  . GERD 07/15/2008  . DIVERTICULOSIS, COLON 07/15/2008   PCP:  Eulas Post, MD Pharmacy:   [facility] [place], [place] [address] Phone: [phone] Fax: [phone]  [id number] - [place], [place] - [address] [place] [address] Phone: [phone] Fax: [phone]     Social Determinants of Health (SDOH) Interventions    Readmission Risk Interventions No flowsheet data found.

## 2019-04-23 NOTE — Telephone Encounter (Signed)
Her employer will be faxing papework over today / please call if nothing is received   Casey Harrell cb# [phone]

## 2019-04-24 DIAGNOSIS — R5381 Other malaise: Secondary | ICD-10-CM | POA: Diagnosis not present

## 2019-04-24 DIAGNOSIS — I480 Paroxysmal atrial fibrillation: Secondary | ICD-10-CM | POA: Diagnosis not present

## 2019-04-24 DIAGNOSIS — G8929 Other chronic pain: Secondary | ICD-10-CM | POA: Diagnosis not present

## 2019-04-24 DIAGNOSIS — E1165 Type 2 diabetes mellitus with hyperglycemia: Secondary | ICD-10-CM | POA: Diagnosis not present

## 2019-04-24 DIAGNOSIS — Z7401 Bed confinement status: Secondary | ICD-10-CM | POA: Diagnosis not present

## 2019-04-24 DIAGNOSIS — M2550 Pain in unspecified joint: Secondary | ICD-10-CM | POA: Diagnosis not present

## 2019-04-24 DIAGNOSIS — Z743 Need for continuous supervision: Secondary | ICD-10-CM | POA: Diagnosis not present

## 2019-04-24 DIAGNOSIS — E785 Hyperlipidemia, unspecified: Secondary | ICD-10-CM | POA: Diagnosis not present

## 2019-04-24 DIAGNOSIS — R0602 Shortness of breath: Secondary | ICD-10-CM | POA: Diagnosis not present

## 2019-04-24 DIAGNOSIS — I4891 Unspecified atrial fibrillation: Secondary | ICD-10-CM | POA: Diagnosis not present

## 2019-04-24 DIAGNOSIS — E871 Hypo-osmolality and hyponatremia: Secondary | ICD-10-CM | POA: Diagnosis not present

## 2019-04-24 DIAGNOSIS — I639 Cerebral infarction, unspecified: Secondary | ICD-10-CM | POA: Diagnosis not present

## 2019-04-24 DIAGNOSIS — K5900 Constipation, unspecified: Secondary | ICD-10-CM | POA: Diagnosis not present

## 2019-04-24 DIAGNOSIS — R062 Wheezing: Secondary | ICD-10-CM | POA: Diagnosis not present

## 2019-04-24 DIAGNOSIS — K219 Gastro-esophageal reflux disease without esophagitis: Secondary | ICD-10-CM | POA: Diagnosis not present

## 2019-04-24 DIAGNOSIS — E119 Type 2 diabetes mellitus without complications: Secondary | ICD-10-CM | POA: Diagnosis not present

## 2019-04-24 DIAGNOSIS — E039 Hypothyroidism, unspecified: Secondary | ICD-10-CM | POA: Diagnosis not present

## 2019-04-24 DIAGNOSIS — M6281 Muscle weakness (generalized): Secondary | ICD-10-CM | POA: Diagnosis not present

## 2019-04-24 DIAGNOSIS — U071 COVID-19: Secondary | ICD-10-CM | POA: Diagnosis not present

## 2019-04-24 DIAGNOSIS — E118 Type 2 diabetes mellitus with unspecified complications: Secondary | ICD-10-CM | POA: Diagnosis not present

## 2019-04-24 DIAGNOSIS — D72829 Elevated white blood cell count, unspecified: Secondary | ICD-10-CM | POA: Diagnosis not present

## 2019-04-24 DIAGNOSIS — G459 Transient cerebral ischemic attack, unspecified: Secondary | ICD-10-CM | POA: Diagnosis not present

## 2019-04-24 DIAGNOSIS — I10 Essential (primary) hypertension: Secondary | ICD-10-CM | POA: Diagnosis not present

## 2019-04-24 DIAGNOSIS — I69398 Other sequelae of cerebral infarction: Secondary | ICD-10-CM | POA: Diagnosis not present

## 2019-04-24 DIAGNOSIS — E559 Vitamin D deficiency, unspecified: Secondary | ICD-10-CM | POA: Diagnosis not present

## 2019-04-24 DIAGNOSIS — E876 Hypokalemia: Secondary | ICD-10-CM | POA: Diagnosis not present

## 2019-04-24 LAB — GLUCOSE, CAPILLARY
Glucose-Capillary: 132 mg/dL — ABNORMAL HIGH (ref 70–99)
Glucose-Capillary: 219 mg/dL — ABNORMAL HIGH (ref 70–99)

## 2019-04-24 LAB — CBC
HCT: 39.5 % (ref 36.0–46.0)
Hemoglobin: 13 g/dL (ref 12.0–15.0)
MCH: 27.5 pg (ref 26.0–34.0)
MCHC: 32.9 g/dL (ref 30.0–36.0)
MCV: 83.7 fL (ref 80.0–100.0)
Platelets: 285 10*3/uL (ref 150–400)
RBC: 4.72 MIL/uL (ref 3.87–5.11)
RDW: 14.7 % (ref 11.5–15.5)
WBC: 8.4 10*3/uL (ref 4.0–10.5)
nRBC: 0 % (ref 0.0–0.2)

## 2019-04-24 LAB — BASIC METABOLIC PANEL
Anion gap: 11 (ref 5–15)
BUN: 8 mg/dL (ref 8–23)
CO2: 26 mmol/L (ref 22–32)
Calcium: 9.4 mg/dL (ref 8.9–10.3)
Chloride: 104 mmol/L (ref 98–111)
Creatinine, Ser: 0.89 mg/dL (ref 0.44–1.00)
GFR calc Af Amer: >60 mL/min (ref >60)
GFR calc non Af Amer: >60 mL/min (ref >60)
Glucose, Bld: 124 mg/dL — ABNORMAL HIGH (ref 70–99)
Potassium: 3.5 mmol/L (ref 3.5–5.1)
Sodium: 141 mmol/L (ref 135–145)

## 2019-04-24 LAB — MAGNESIUM: Magnesium: 1.6 mg/dL — ABNORMAL LOW (ref 1.7–2.4)

## 2019-04-24 MED ORDER — DULOXETINE HCL 30 MG PO CPEP: 30.0000 mg | ORAL_CAPSULE | Freq: Two times a day (BID) | ORAL | 1 refills | Status: DC

## 2019-04-24 MED ORDER — APIXABAN 5 MG PO TABS: 5.0000 mg | ORAL_TABLET | Freq: Two times a day (BID) | ORAL | 0 refills | Status: DC

## 2019-04-24 MED ORDER — AMLODIPINE BESYLATE 5 MG PO TABS: 5.0000 mg | ORAL_TABLET | Freq: Every evening | ORAL | 0 refills | Status: DC

## 2019-04-24 MED ORDER — ATORVASTATIN CALCIUM 80 MG PO TABS: 80.0000 mg | ORAL_TABLET | Freq: Every day | ORAL | 0 refills | Status: DC

## 2019-04-24 MED ORDER — INSULIN DETEMIR 100 UNIT/ML FLEXPEN: 35.0000 [IU] | PEN_INJECTOR | Freq: Two times a day (BID) | SUBCUTANEOUS | 0 refills | Status: DC

## 2019-04-24 MED ORDER — INSULIN ASPART 100 UNIT/ML ~~LOC~~ SOLN: 4.0000 [IU] | Freq: Three times a day (TID) | SUBCUTANEOUS | 11 refills | Status: DC

## 2019-04-24 MED ORDER — LEVOTHYROXINE SODIUM 50 MCG PO TABS: 50.0000 ug | ORAL_TABLET | Freq: Every day | ORAL | 0 refills | Status: DC

## 2019-04-24 MED ORDER — PROAIR RESPICLICK 108 (90 BASE) MCG/ACT IN AEPB: 2.0000 | INHALATION_SPRAY | RESPIRATORY_TRACT | 1 refills | Status: DC | PRN

## 2019-04-24 MED ORDER — ASPIRIN 81 MG PO TBEC: 81.0000 mg | DELAYED_RELEASE_TABLET | Freq: Every day | ORAL | 0 refills | Status: AC

## 2019-04-24 MED ORDER — PANTOPRAZOLE SODIUM 40 MG PO TBEC: 40.0000 mg | DELAYED_RELEASE_TABLET | Freq: Two times a day (BID) | ORAL | 0 refills | Status: DC

## 2019-04-24 NOTE — Care Management Important Message (Signed)
Important Message  Patient Details  Name: Casey Harrell MRN: [id number] Date of Birth: 1934/12/07   Medicare Important Message Given:  Yes     [name] 04/24/2019, 1:42 PM

## 2019-04-24 NOTE — TOC Transition Note (Signed)
Transition of Care [facility]) - CM/SW Discharge Note   Patient Details  Name: [name] MRN: [id number] Date of Birth: Dec 10, 1934  Transition of Care [facility]) CM/SW Contact:  Kirstie Peri, [place] Work Phone Number: 04/24/2019, 10:19 AM   Clinical Narrative:    Nurse to call report to [phone] Rm # [id number]     Barriers to Discharge: Continued Medical Work up   Patient Goals and CMS Choice   CMS Medicare.gov Compare Post Acute Care list provided to:: Patient Represenative (must comment) Choice offered to / list presented to : Adult Children(son)  Discharge Placement                       Discharge Plan and Services In-house Referral: Clinical Social Work Discharge Planning Services: CM Consult Post Acute Care Choice: [place]                               Social Determinants of Health (SDOH) Interventions     Readmission Risk Interventions No flowsheet data found.

## 2019-04-24 NOTE — Progress Notes (Signed)
Pt discharged to [facility]. Report called to facility and given to [place], RN. AVS documentation placed in packet to give to Nurse at the facility. All IV's discontinued with no bleeding noted.Pt transported by [facility]. All Belongings sent with patient to facility.  Janett Billow, RN

## 2019-04-24 NOTE — Progress Notes (Signed)
  Speech Language Pathology Treatment: Cognitive-Linquistic;Dysphagia  Patient Details Name: Casey Harrell MRN: [id number] DOB: [birth year] Today's Date: 04/24/2019 Time: 3662-9476 SLP Time Calculation (min) (ACUTE ONLY): 28 min  Assessment / Plan / Recommendation Clinical Impression  Patient was seen for skilled therapy for dysphagia and aphasia. Patient continues to tolerate current diet. Due to continued oral mastication impairment will not recommend an upgrade to diet at this time. Reviewed compensatory strategies with patient.   Cognitive tx: automatic sequences, DOW 100% with no cues, MOY 50% accuracy 100% with phonemic cues. Oriented pt to date and talked about [place] entering a new year soon. Pt able to give the new year "2021" on second attempt, she was aware of all her mistakes on above task and self corrected on several. Confrontational naming was 100% accurate. Follow 1-step commands 100%, but 2-step commands 0%. Patient expressed frustration through her body language (breathing hard and tensing up).   Patient expressed concern for discharge. She is aware she will need help on discharge.    HPI HPI: 84yo female admitted 04/21/2019 with confusion and dysarthria. 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 MRI 04/21/2019 = new left MCA infarcts      SLP Plan  Continue with current plan of care       Recommendations  Diet recommendations: Dysphagia 2 (fine chop);Thin liquid Liquids provided via: Cup;Straw Medication Administration: Whole meds with puree Supervision: Intermittent supervision to cue for compensatory strategies Compensations: Minimize environmental distractions;Small sips/bites;Slow rate Postural Changes and/or Swallow Maneuvers: Seated upright 90 degrees                Plan: Continue with current  plan of care       GO                Wynelle Bourgeois., MA, CCC-SLP 04/24/2019, 1:20 PM

## 2019-04-24 NOTE — Discharge Summary (Signed)
Physician Discharge Summary  [name]:[id number] DOB: [birth year] DOA: 04/21/2019  PCP: [name], MD  Admit date: 04/21/2019 Discharge date: 04/24/2019  Admitted From: [facility] Disposition:  [facility]  Recommendations for Outpatient Follow-up:  1. Follow up with PCP in 1-2 weeks 2. Follow-up with [facility] in [facility] in 4 weeks after discharge 3. Please obtain BMP in one week  Home Health: N/A Equipment/Devices: N/A  Discharge Condition: Stable CODE STATUS: Full code Diet recommendation:    Dysphagia 2 (Fine chop);Thin liquid     Liquid Administration via: Cup;Straw   Medication Administration: Whole meds with puree   Supervision: Patient able to self feed;Staff to assist with self feeding;Full supervision/cueing   for                        compensatory strategies   Compensations: Minimize environmental distractions;Small sips/bites;Slow rate   Postural Changes: Seated upright at 90 degrees;Remain upright for at least 30 minutes after po intake   History of present illness:  Casey Harrell a 84 y.o.femalewho lives in independent living and has a PMH of hypertension, diabetes mellitus, paroxysmal A. fib on Eliquis, CKD stage IIIb, chronic hyponatremia, history of CVA brought by EMS for the concern of code stroke. She was noted to be aphasic   MRI/MRA> Scattered small acute infarcts in the left MCA territory in the setting of chronic severe left M1 stenosis  Hospital course:  Ischemic stroke in left MCA territory related to chronic severe left M1 stenosis Patient presenting to the ED via EMS for aphasia.  History of CVA.  CT head on admission with no evidence of acute intracranial abnormality with redemonstration of chronic infarcts within the bilateral occipital lobes, and chronic lacunar infarcts in the bilateral basal ganglia, thalami, pons with stable generalized parenchymal atrophy and chronic small vessel ischemic  disease.  MR brain without contrast and MRA with scattered small acute infarcts left MCA territory in the setting of chronic severe left M1 stenosis, no associated hemorrhage or mass-effect.  Carotid Doppler unremarkable.  Hemoglobin A1c 14.1, poorly controlled.  LDL 95, HDL 25, total cholesterol 811, triglycerides 215. Patient continues to slowly improve with ability to speak, alert, right-sided facial droop and 4/5 right-sided weakness.  Continue aspirin 81 mg p.o. daily, Eliquis 5 mg p.o. daily, atorvastatin 80 mg p.o. daily.  Patient will need follow-up with [facility] in stroke clinic 4 weeks following discharge.  Discharging to SNF for continued rehab efforts.  DM2 Hemoglobin A1c 14.1, poorly controlled.    Continue Levemir 35 units subcutaneously twice daily, NovoLog 4 units 3 times daily AC.  Continue to monitor glucose closely and adjust accordingly.  Hyperlipidemia LDL 95, HDL 25, total cholesterol 914, triglycerides 215.  Started on atorvastatin 80 mg p.o. daily  Hyponatremia/ AKI Na 131 and creatinine 1.69 on admission, suspect prerenal azotemia/dehydration.  Patient was started on IV fluid with improvement of creatinine to 0.89 at time of discharge.  Continue to encourage increased oral intake.  Hypokalemia Repleted during hospitalization.  Potassium 3.5 at time of discharge.  Recommend repeat BMP in 1 week.  PAF (paroxysmal atrial fibrillation) Continue Eliquis 5 mg p.o. twice daily  Hypothyroidism Continue Synthroid 50 mcg p.o. daily  Essential hypertension Continue amlodipine  Leukocytosis: Resolved WBC 14.0-->11.9-->8.4. Likely stress response.  No signs or symptoms of infection.  Now resolved.  Discharge Diagnoses:  Principal Problem:   Ischemic stroke [facility]) Active Problems:  Hypothyroidism   Essential hypertension   GERD   HLD (hyperlipidemia)   DM (diabetes mellitus) (HCC)   PAF (paroxysmal atrial fibrillation)  [facility])    Discharge Instructions  Discharge Instructions    Ambulatory referral to Neurology   Complete by: As directed    Follow up with stroke clinic NP (Jessica Vanschaick or Cecille Rubin, if both not available, consider Zachery Dauer, or Ahern) at [facility] in about 4 weeks. Thanks.   Diet - low sodium heart healthy   Complete by: As directed    Increase activity slowly   Complete by: As directed      Allergies as of 04/24/2019      Reactions   Doxycycline Hyclate Nausea And Vomiting   Morphine Sulfate Other (See Comments)   Causes hallucinations   Iohexol Hives   Patient stated in 2010 this caused HIVES   Moxifloxacin Other (See Comments)   Unknown reaction      Medication List    STOP taking these medications   diclofenac sodium 1 % Gel Commonly known as: VOLTAREN   fluticasone 50 MCG/ACT nasal spray Commonly known as: FLONASE   insulin lispro 100 UNIT/ML KwikPen Commonly known as: HumaLOG KwikPen Replaced by: insulin aspart 100 UNIT/ML injection   ondansetron 4 MG disintegrating tablet Commonly known as: Zofran ODT     TAKE these medications   amLODipine 5 MG tablet Commonly known as: NORVASC Take 1 tablet (5 mg total) by mouth every evening. What changed: Another medication with the same name was removed. Continue taking this medication, and follow the directions you see here.   apixaban 5 MG Tabs tablet Commonly known as: Eliquis Take 1 tablet (5 mg total) by mouth 2 (two) times daily.   aspirin 81 MG EC tablet Take 1 tablet (81 mg total) by mouth daily.   atorvastatin 80 MG tablet Commonly known as: LIPITOR Take 1 tablet (80 mg total) by mouth daily at 6 PM. What changed:   medication strength  how much to take  when to take this   B-D ULTRAFINE III SHORT PEN 31G X 8 MM Misc Generic drug: Insulin Pen Needle USE TO INJECT INSULIN 5 TIMES DAILY   Biotin 5000 MCG Tabs Take 5,000 mcg by mouth every evening.   CALCIUM 600-D PO Take 1  tablet by mouth every evening.   DULoxetine 30 MG capsule Commonly known as: CYMBALTA Take 1 capsule (30 mg total) by mouth 2 (two) times daily.   FreeStyle Libre 14 Day Reader Kerrin Mo Apply 1 Device topically daily.   FreeStyle Libre 14 Day Sensor Misc Inject 1 Device into the skin every 14 (fourteen) days.   insulin aspart 100 UNIT/ML injection Commonly known as: novoLOG Inject 4 Units into the skin 3 (three) times daily with meals. Replaces: insulin lispro 100 UNIT/ML KwikPen   Insulin Detemir 100 UNIT/ML Pen Commonly known as: LEVEMIR Inject 35 Units into the skin 2 (two) times daily. What changed: how much to take   levothyroxine 50 MCG tablet Commonly known as: SYNTHROID Take 1 tablet (50 mcg total) by mouth daily before breakfast. What changed: See the new instructions.   OneTouch Ultra test strip Generic drug: glucose blood TEST TWICE A DAY   pantoprazole 40 MG tablet Commonly known as: PROTONIX Take 1 tablet (40 mg total) by mouth 2 (two) times daily.   ProAir RespiClick 242 (90 Base) MCG/ACT Aepb Generic drug: Albuterol Sulfate Inhale 2 puffs into the lungs every 4 (four) hours as needed.  Vitamin D3 50 MCG (2000 UT) Tabs Take 2,000 Units by mouth daily. Reported on 04/26/2015 What changed: when to take this       Contact information for follow-up providers    [facility]. Schedule an appointment as soon as possible for a visit in 4 week(s).   Specialty: Neurology Contact information: [address] [place] [address] [phone]       [name], MD. Schedule an appointment as soon as possible for a visit in 1 week(s).   Specialty: Family Medicine Contact information: [address] [place] [address] [phone]            Contact information for after-discharge care    Destination    [facility] .   Service: Skilled Nursing Contact information: [address] [place] [address] [phone]                 Allergies  Allergen Reactions  . Doxycycline Hyclate Nausea And Vomiting  . Morphine Sulfate Other (See Comments)    Causes hallucinations  . Iohexol Hives    Patient stated in 2010 this caused HIVES   . Moxifloxacin Other (See Comments)    Unknown reaction    Consultations:  Neurology   Procedures/Studies: MR ANGIO HEAD WO CONTRAST  Result Date: 04/21/2019 CLINICAL DATA:  84 year old female with code stroke presentation. History of multifocal advanced intracranial artery stenoses. EXAM: MRI HEAD WITHOUT CONTRAST MRA HEAD WITHOUT CONTRAST TECHNIQUE: Multiplanar, multiecho pulse sequences of the brain and surrounding structures were obtained without intravenous contrast. Angiographic images of the head were obtained using MRA technique without contrast. COMPARISON:  Plain head CT 1238 hours today. Brain MRI and intracranial MRA 10/03/2018 and earlier. FINDINGS: MRI HEAD FINDINGS Brain: Scattered small areas of cortical and white matter restricted diffusion at the posterior left sylvian fissure, most notably series 5, image 76. Additional focal cortical involvement at the left superolateral motor strip on image 89. Mild parietal lobe involvement on image 83. No other vascular territory restricted diffusion. Chronic ischemic disease with encephalomalacia in the left MCA territory, deep gray matter nuclei, brainstem. Additional confluent bilateral cerebral white matter T2 and FLAIR hyperintensity. Chronic blood products in the left deep cerebellar nuclei and brainstem. No midline shift, mass effect, evidence of mass lesion, ventriculomegaly, extra-axial collection or acute intracranial hemorrhage. Cervicomedullary junction and pituitary are within normal limits. Vascular: Major intracranial vascular flow voids are grossly stable. Skull and upper cervical spine: Negative visible cervical spine. Visualized bone marrow  signal is within normal limits. Sinuses/Orbits: Stable. Other: Mastoids remain clear.  Negative scalp soft tissues. MRA HEAD FINDINGS Degraded by motion despite repeated imaging attempts. Preserved antegrade flow signal in the distal vertebral arteries, basilar, and both ICA siphons. Stable appearance of the right PICA. Asymmetric loss of the left MCA M1 segment flow signal, which on comparison with source images from 10/03/2018 appears unchanged and related to chronic severe left M1 stenosis. Asymmetrically decreased left MCA M2 branch flow has not definitely changed when allowing for motion today. The contralateral right M1, right MCA bifurcation and proximal branches appear to remain within normal limits. Chronic asymmetrically decreased signal also in the right ACA A1. Chronic bilateral PCA stenosis was also better demonstrated on the prior study. IMPRESSION: 1. Scattered small acute infarcts in the left MCA territory in the setting of chronic severe left M1 stenosis. No associated hemorrhage or mass effect. Study discussed by telephone with Neurology Dr. Laurence Slate on  04/21/2019 at 1339 hours. 2. Otherwise stable advanced chronic intracranial ischemic disease. 3. Motion degraded MRA today appears otherwise stable since May this year. Electronically Signed   By: Odessa FlemingH  Hall M.D.   On: 04/21/2019 13:59   MR BRAIN WO CONTRAST  Result Date: 04/21/2019 CLINICAL DATA:  84 year old female with code stroke presentation. History of multifocal advanced intracranial artery stenoses. EXAM: MRI HEAD WITHOUT CONTRAST MRA HEAD WITHOUT CONTRAST TECHNIQUE: Multiplanar, multiecho pulse sequences of the brain and surrounding structures were obtained without intravenous contrast. Angiographic images of the head were obtained using MRA technique without contrast. COMPARISON:  Plain head CT 1238 hours today. Brain MRI and intracranial MRA 10/03/2018 and earlier. FINDINGS: MRI HEAD FINDINGS Brain: Scattered small areas of cortical and  white matter restricted diffusion at the posterior left sylvian fissure, most notably series 5, image 76. Additional focal cortical involvement at the left superolateral motor strip on image 89. Mild parietal lobe involvement on image 83. No other vascular territory restricted diffusion. Chronic ischemic disease with encephalomalacia in the left MCA territory, deep gray matter nuclei, brainstem. Additional confluent bilateral cerebral white matter T2 and FLAIR hyperintensity. Chronic blood products in the left deep cerebellar nuclei and brainstem. No midline shift, mass effect, evidence of mass lesion, ventriculomegaly, extra-axial collection or acute intracranial hemorrhage. Cervicomedullary junction and pituitary are within normal limits. Vascular: Major intracranial vascular flow voids are grossly stable. Skull and upper cervical spine: Negative visible cervical spine. Visualized bone marrow signal is within normal limits. Sinuses/Orbits: Stable. Other: Mastoids remain clear.  Negative scalp soft tissues. MRA HEAD FINDINGS Degraded by motion despite repeated imaging attempts. Preserved antegrade flow signal in the distal vertebral arteries, basilar, and both ICA siphons. Stable appearance of the right PICA. Asymmetric loss of the left MCA M1 segment flow signal, which on comparison with source images from 10/03/2018 appears unchanged and related to chronic severe left M1 stenosis. Asymmetrically decreased left MCA M2 branch flow has not definitely changed when allowing for motion today. The contralateral right M1, right MCA bifurcation and proximal branches appear to remain within normal limits. Chronic asymmetrically decreased signal also in the right ACA A1. Chronic bilateral PCA stenosis was also better demonstrated on the prior study. IMPRESSION: 1. Scattered small acute infarcts in the left MCA territory in the setting of chronic severe left M1 stenosis. No associated hemorrhage or mass effect. Study  discussed by telephone with Neurology Dr. Laurence SlateAroor on 04/21/2019 at 1339 hours. 2. Otherwise stable advanced chronic intracranial ischemic disease. 3. Motion degraded MRA today appears otherwise stable since May this year. Electronically Signed   By: Odessa FlemingH  Hall M.D.   On: 04/21/2019 13:59   CT HEAD CODE STROKE WO CONTRAST  Result Date: 04/21/2019 CLINICAL DATA:  Code stroke. Focal neuro deficit, greater than 6 hours, stroke suspected. EXAM: CT HEAD WITHOUT CONTRAST TECHNIQUE: Contiguous axial images were obtained from the base of the skull through the vertex without intravenous contrast. COMPARISON:  Head CT 03/16/2019 FINDINGS: Brain: Mildly motion degraded examination. No evidence of acute intracranial hemorrhage or acute demarcated cortical infarction. Redemonstrated chronic cortical/subcortical infarcts within the occipital lobes (more prominent on the left). Stable generalized parenchymal atrophy and chronic small vessel ischemic disease. Redemonstrated chronic lacunar infarcts within the bilateral basal ganglia and thalami. A now chronic infarct within the paramedian right pons was better appreciated on prior brain MRI. No evidence of intracranial mass. No midline shift or extra-axial fluid collection. Vascular: No hyperdense vessel.  Atherosclerotic calcifications. Skull: Normal. Negative for fracture or  focal lesion. Sinuses/Orbits: Visualized orbits demonstrate no acute abnormality. Mild mucosal thickening within the sphenoid sinuses (greater on the right). Polypoid mucosal thickening within the inferior left maxillary sinus. No significant mastoid effusion These results were communicated to Dr. Laurence Slate At 12:50 pmon 12/14/2020by text page via the [facility] messaging system. IMPRESSION: No CT evidence of acute intracranial abnormality. Redemonstrated chronic infarcts within bilateral occipital lobes. Stable generalized parenchymal atrophy and chronic small vessel ischemic disease. Redemonstrated chronic lacunar  infarcts in the bilateral basal ganglia, thalami and pons. Electronically Signed   By: Jackey Loge DO   On: 04/21/2019 12:52   VAS US CAROTID (at [facility] and [facility] only)  Result Date: 04/22/2019 Carotid Arterial Duplex Study Indications:       CVA. Risk Factors:      Hypertension, Diabetes, coronary artery disease. Other Factors:     A-Fib. Comparison Study:  Carotid 10/04/18 - 1-39% bilaterally. Performing Technologist: Marilynne Halsted RDMS, RVT  Examination Guidelines: A complete evaluation includes B-mode imaging, spectral Doppler, color Doppler, and power Doppler as needed of all accessible portions of each vessel. Bilateral testing is considered an integral part of a complete examination. Limited examinations for reoccurring indications may be performed as noted.  Right Carotid Findings: +----------+--------+--------+--------+------------------+------------------+           PSV cm/sEDV cm/sStenosisPlaque DescriptionComments           +----------+--------+--------+--------+------------------+------------------+ CCA Prox  76      15                                                   +----------+--------+--------+--------+------------------+------------------+ CCA Distal122     22                                intimal thickening +----------+--------+--------+--------+------------------+------------------+ ICA Prox  78      16      1-39%   homogeneous                          +----------+--------+--------+--------+------------------+------------------+ ICA Distal42      17                                                   +----------+--------+--------+--------+------------------+------------------+ ECA       89      11                                                   +----------+--------+--------+--------+------------------+------------------+ +----------+--------+-------+----------------+-------------------+           PSV cm/sEDV cmsDescribe        Arm Pressure (mmHG)  +----------+--------+-------+----------------+-------------------+ [id number]            Multiphasic, WNL                    +----------+--------+-------+----------------+-------------------+ +---------+--------+--+--------+--+---------+ VertebralPSV cm/s38EDV cm/s11Antegrade +---------+--------+--+--------+--+---------+  Left Carotid Findings: +----------+--------+--------+--------+------------------+------------------+           PSV cm/sEDV cm/sStenosisPlaque DescriptionComments           +----------+--------+--------+--------+------------------+------------------+  CCA Prox  63      11                                                   +----------+--------+--------+--------+------------------+------------------+ CCA Distal51      9                                 intimal thickening +----------+--------+--------+--------+------------------+------------------+ ICA Prox  58      11      1-39%   heterogenous                         +----------+--------+--------+--------+------------------+------------------+ ICA Distal48      15                                                   +----------+--------+--------+--------+------------------+------------------+ ECA       103     10                                                   +----------+--------+--------+--------+------------------+------------------+ +----------+--------+--------+----------------+-------------------+           PSV cm/sEDV cm/sDescribe        Arm Pressure (mmHG) +----------+--------+--------+----------------+-------------------+ [id number]             Multiphasic, WNL                    +----------+--------+--------+----------------+-------------------+ +---------+--------+--+--------+-+---------+ VertebralPSV cm/s25EDV cm/s7Antegrade +---------+--------+--+--------+-+---------+  Summary: Right Carotid: Velocities in the right ICA are consistent with a 1-39% stenosis. Left  Carotid: Velocities in the left ICA are consistent with a 1-39% stenosis. Vertebrals: Bilateral vertebral arteries demonstrate antegrade flow. *See table(s) above for measurements and observations.  Electronically signed by Waverly Ferrari MD on 04/22/2019 at 7:59:46 PM.    Final       Subjective: Patient seen and examined at bedside, resting comfortably.  No specific complaints this morning.  Ready for discharge to SNF today.  Denies headache, no chest pain, no palpitations, no shortness of breath, no abdominal pain.  No acute events overnight per nursing staff.   Discharge Exam: Vitals:   04/24/19 0751 04/24/19 0759  BP: (!) 170/97   Pulse: 90   Resp: 17 19  Temp: (!) 97.5 F (36.4 C)   SpO2: 98%    Vitals:   04/23/19 2348 04/24/19 0411 04/24/19 0751 04/24/19 0759  BP: (!) 155/83 (!) 162/73 (!) 170/97   Pulse: 73 85 90   Resp: 17 16 17 19   Temp: 97.9 F (36.6 C)  (!) 97.5 F (36.4 C)   TempSrc: Oral  Oral   SpO2: 96% 97% 98%     General exam: Appears comfortable  HEENT: PERRLA, oral mucosa moist, no sclera icterus or thrush Respiratory system: Clear to auscultation. Respiratory effort normal. Cardiovascular system: S1 & S2 heard, RRR.   Gastrointestinal system: Abdomen soft, non-tender, nondistended. Normal bowel sounds. Central nervous system: Alert and oriented. Right facial droop and right sided weakness 4/5 Extremities:  No cyanosis, clubbing or edema Skin: No rashes or ulcers Psychiatry:  Mood & affect appropriate.     The results of significant diagnostics from this hospitalization (including imaging, microbiology, ancillary and laboratory) are listed below for reference.     Microbiology: Recent Results (from the past 240 hour(s))  SARS CORONAVIRUS 2 (TAT 6-24 HRS) Nasopharyngeal Nasopharyngeal Swab     Status: None   Collection Time: 04/21/19  3:27 PM   Specimen: Nasopharyngeal Swab  Result Value Ref Range Status   SARS Coronavirus 2 NEGATIVE NEGATIVE  Final    Comment: (NOTE) SARS-CoV-2 target nucleic acids are NOT DETECTED. The SARS-CoV-2 RNA is generally detectable in upper and lower respiratory specimens during the acute phase of infection. Negative results do not preclude SARS-CoV-2 infection, do not rule out co-infections with other pathogens, and should not be used as the sole basis for treatment or other patient management decisions. Negative results must be combined with clinical observations, patient history, and epidemiological information. The expected result is Negative. Fact Sheet for Patients: HairSlick.no Fact Sheet for Healthcare Providers: quierodirigir.com This test is not yet approved or cleared by the [place] FDA and  has been authorized for detection and/or diagnosis of SARS-CoV-2 by FDA under an Emergency Use Authorization (EUA). This EUA will remain  in effect (meaning this test can be used) for the duration of the COVID-19 declaration under Section 56 4(b)(1) of the Act, 21 U.S.C. section 360bbb-3(b)(1), unless the authorization is terminated or revoked sooner. Performed at [facility], [address] [place], [place] [address]   SARS CORONAVIRUS 2 (TAT 6-24 HRS) Nasopharyngeal Nasopharyngeal Swab     Status: None   Collection Time: 04/23/19  1:39 PM   Specimen: Nasopharyngeal Swab  Result Value Ref Range Status   SARS Coronavirus 2 NEGATIVE NEGATIVE Final    Comment: (NOTE) SARS-CoV-2 target nucleic acids are NOT DETECTED. The SARS-CoV-2 RNA is generally detectable in upper and lower respiratory specimens during the acute phase of infection. Negative results do not preclude SARS-CoV-2 infection, do not rule out co-infections with other pathogens, and should not be used as the sole basis for treatment or other patient management decisions. Negative results must be combined with clinical observations, patient history, and epidemiological  information. The expected result is Negative. Fact Sheet for Patients: HairSlick.no Fact Sheet for Healthcare Providers: quierodirigir.com This test is not yet approved or cleared by the [place] FDA and  has been authorized for detection and/or diagnosis of SARS-CoV-2 by FDA under an Emergency Use Authorization (EUA). This EUA will remain  in effect (meaning this test can be used) for the duration of the COVID-19 declaration under Section 56 4(b)(1) of the Act, 21 U.S.C. section 360bbb-3(b)(1), unless the authorization is terminated or revoked sooner. Performed at [facility], [address] [place], [place] [address]      Labs: BNP (last 3 results) No results for input(s): BNP in the last 8760 hours. Basic Metabolic Panel: Recent Labs  Lab 04/21/19 1230 04/21/19 1231 04/22/19 0328 04/24/19 0245  NA 131* 131* 134* 141  K 3.7 3.6 3.2* 3.5  CL 93* 94* 98 104  CO2 21*  --  21* 26  GLUCOSE 321* 313* 338* 124*  BUN CREATININE 1.69* 1.20* 1.22* 0.89  CALCIUM 9.8  --  9.7 9.4  MG  --   --   --  1.6*   Liver Function Tests: Recent Labs  Lab 04/21/19 1230  AST  23  ALT 17  ALKPHOS 116  BILITOT 0.6  PROT 7.5  ALBUMIN 3.8   No results for input(s): LIPASE, AMYLASE in the last 168 hours. No results for input(s): AMMONIA in the last 168 hours. CBC: Recent Labs  Lab 04/21/19 1230 04/21/19 1231 04/22/19 0328 04/24/19 0245  WBC 14.0*  --  11.9* 8.4  NEUTROABS 8.5*  --  6.2  --   HGB 14.4 16.3* 15.3* 13.0  HCT 44.1 48.0* 46.5* 39.5  MCV 83.4  --  82.4 83.7  PLT 352  --  343 285   Cardiac Enzymes: No results for input(s): CKTOTAL, CKMB, CKMBINDEX, TROPONINI in the last 168 hours. BNP: Invalid input(s): POCBNP CBG: Recent Labs  Lab 04/23/19 0744 04/23/19 1159 04/23/19 1658 04/23/19 2128 04/24/19 0616  GLUCAP 244* 209* 224* 326* 132*   D-Dimer No results for input(s): DDIMER in  the last 72 hours. Hgb A1c Recent Labs    04/22/19 0328  HGBA1C 14.1*   Lipid Profile Recent Labs    04/22/19 0328  CHOL 163  HDL 25*  LDLCALC 95  TRIG 409*  CHOLHDL 6.5   Thyroid function studies No results for input(s): TSH, T4TOTAL, T3FREE, THYROIDAB in the last 72 hours.  Invalid input(s): FREET3 Anemia work up No results for input(s): VITAMINB12, FOLATE, FERRITIN, TIBC, IRON, RETICCTPCT in the last 72 hours. Urinalysis    Component Value Date/Time   COLORURINE STRAW (A) 03/16/2019 1949   APPEARANCEUR CLEAR 03/16/2019 1949   LABSPEC 1.020 03/16/2019 1949   PHURINE 6.0 03/16/2019 1949   GLUCOSEU >=500 (A) 03/16/2019 1949   HGBUR SMALL (A) 03/16/2019 1949   BILIRUBINUR NEGATIVE 03/16/2019 1949   BILIRUBINUR neg 10/15/2017 1527   KETONESUR 5 (A) 03/16/2019 1949   PROTEINUR 30 (A) 03/16/2019 1949   UROBILINOGEN 0.2 10/15/2017 1527   UROBILINOGEN 0.2 11/05/2014 1555   NITRITE NEGATIVE 03/16/2019 1949   LEUKOCYTESUR NEGATIVE 03/16/2019 1949   Sepsis Labs Invalid input(s): PROCALCITONIN,  WBC,  LACTICIDVEN Microbiology Recent Results (from the past 240 hour(s))  SARS CORONAVIRUS 2 (TAT 6-24 HRS) Nasopharyngeal Nasopharyngeal Swab     Status: None   Collection Time: 04/21/19  3:27 PM   Specimen: Nasopharyngeal Swab  Result Value Ref Range Status   SARS Coronavirus 2 NEGATIVE NEGATIVE Final    Comment: (NOTE) SARS-CoV-2 target nucleic acids are NOT DETECTED. The SARS-CoV-2 RNA is generally detectable in upper and lower respiratory specimens during the acute phase of infection. Negative results do not preclude SARS-CoV-2 infection, do not rule out co-infections with other pathogens, and should not be used as the sole basis for treatment or other patient management decisions. Negative results must be combined with clinical observations, patient history, and epidemiological information. The expected result is Negative. Fact Sheet for  Patients: HairSlick.no Fact Sheet for Healthcare Providers: quierodirigir.com This test is not yet approved or cleared by the [place] FDA and  has been authorized for detection and/or diagnosis of SARS-CoV-2 by FDA under an Emergency Use Authorization (EUA). This EUA will remain  in effect (meaning this test can be used) for the duration of the COVID-19 declaration under Section 56 4(b)(1) of the Act, 21 U.S.C. section 360bbb-3(b)(1), unless the authorization is terminated or revoked sooner. Performed at [facility], [address] [place], [place] [address]   SARS CORONAVIRUS 2 (TAT 6-24 HRS) Nasopharyngeal Nasopharyngeal Swab     Status: None   Collection Time: 04/23/19  1:39 PM   Specimen: Nasopharyngeal Swab  Result Value Ref  Range Status   SARS Coronavirus 2 NEGATIVE NEGATIVE Final    Comment: (NOTE) SARS-CoV-2 target nucleic acids are NOT DETECTED. The SARS-CoV-2 RNA is generally detectable in upper and lower respiratory specimens during the acute phase of infection. Negative results do not preclude SARS-CoV-2 infection, do not rule out co-infections with other pathogens, and should not be used as the sole basis for treatment or other patient management decisions. Negative results must be combined with clinical observations, patient history, and epidemiological information. The expected result is Negative. Fact Sheet for Patients: HairSlick.no Fact Sheet for Healthcare Providers: quierodirigir.com This test is not yet approved or cleared by the [place] FDA and  has been authorized for detection and/or diagnosis of SARS-CoV-2 by FDA under an Emergency Use Authorization (EUA). This EUA will remain  in effect (meaning this test can be used) for the duration of the COVID-19 declaration under Section 56 4(b)(1) of the Act, 21 U.S.C. section  360bbb-3(b)(1), unless the authorization is terminated or revoked sooner. Performed at [facility], [address], [place], [place] [address]      Time coordinating discharge: Over 30 minutes  SIGNED:   Alvira Philips [place], DO  [facility] 04/24/2019, 9:16 AM

## 2019-04-25 DIAGNOSIS — E119 Type 2 diabetes mellitus without complications: Secondary | ICD-10-CM | POA: Diagnosis not present

## 2019-04-25 DIAGNOSIS — I4891 Unspecified atrial fibrillation: Secondary | ICD-10-CM | POA: Diagnosis not present

## 2019-04-25 DIAGNOSIS — E876 Hypokalemia: Secondary | ICD-10-CM | POA: Diagnosis not present

## 2019-04-25 DIAGNOSIS — E785 Hyperlipidemia, unspecified: Secondary | ICD-10-CM | POA: Diagnosis not present

## 2019-04-25 DIAGNOSIS — G459 Transient cerebral ischemic attack, unspecified: Secondary | ICD-10-CM | POA: Diagnosis not present

## 2019-04-27 DIAGNOSIS — E1165 Type 2 diabetes mellitus with hyperglycemia: Secondary | ICD-10-CM | POA: Diagnosis not present

## 2019-04-29 DIAGNOSIS — E118 Type 2 diabetes mellitus with unspecified complications: Secondary | ICD-10-CM | POA: Diagnosis not present

## 2019-04-29 DIAGNOSIS — E876 Hypokalemia: Secondary | ICD-10-CM | POA: Diagnosis not present

## 2019-04-29 DIAGNOSIS — I69398 Other sequelae of cerebral infarction: Secondary | ICD-10-CM | POA: Diagnosis not present

## 2019-04-29 DIAGNOSIS — I4891 Unspecified atrial fibrillation: Secondary | ICD-10-CM | POA: Diagnosis not present

## 2019-04-29 DIAGNOSIS — E785 Hyperlipidemia, unspecified: Secondary | ICD-10-CM | POA: Diagnosis not present

## 2019-04-30 DIAGNOSIS — E876 Hypokalemia: Secondary | ICD-10-CM | POA: Diagnosis not present

## 2019-04-30 DIAGNOSIS — G459 Transient cerebral ischemic attack, unspecified: Secondary | ICD-10-CM | POA: Diagnosis not present

## 2019-04-30 DIAGNOSIS — E785 Hyperlipidemia, unspecified: Secondary | ICD-10-CM | POA: Diagnosis not present

## 2019-04-30 DIAGNOSIS — E119 Type 2 diabetes mellitus without complications: Secondary | ICD-10-CM | POA: Diagnosis not present

## 2019-04-30 DIAGNOSIS — I4891 Unspecified atrial fibrillation: Secondary | ICD-10-CM | POA: Diagnosis not present

## 2019-04-30 NOTE — Telephone Encounter (Signed)
Called patient and LMOVM to return call  [place] for [facility] to Discuss results / PCP / recommendations / Schedule patient  Left daughter Lynelle Smoke a VM letting her know that the [employer] forms they sent spelled her mothers name wrong, needs to be "Immaculate Crutcher" and also need to spell "Dr. Elease Hashimoto" correctly for [place] to complete the paperwork and send back to them.  CRM Created.

## 2019-05-07 DIAGNOSIS — E119 Type 2 diabetes mellitus without complications: Secondary | ICD-10-CM | POA: Diagnosis not present

## 2019-05-07 DIAGNOSIS — G459 Transient cerebral ischemic attack, unspecified: Secondary | ICD-10-CM | POA: Diagnosis not present

## 2019-05-07 DIAGNOSIS — U071 COVID-19: Secondary | ICD-10-CM | POA: Diagnosis not present

## 2019-05-07 DIAGNOSIS — E876 Hypokalemia: Secondary | ICD-10-CM | POA: Diagnosis not present

## 2019-05-07 DIAGNOSIS — E785 Hyperlipidemia, unspecified: Secondary | ICD-10-CM | POA: Diagnosis not present

## 2019-05-08 ENCOUNTER — Telehealth: Payer: Self-pay | Admitting: Family Medicine

## 2019-05-08 NOTE — Telephone Encounter (Signed)
Pts daughter Lynelle Smoke) dropped off FMLA forms to be completed by the provider.  Upon completion pt would like for it to be faxed to [phone].  Forms placed in providers folder for completion.  Daughter would like to see if this could be done as soon as possible due to her HR department had gotten her mothers name incorrect on the form several times.

## 2019-05-12 NOTE — Telephone Encounter (Signed)
Daughter Casey Harrell dropped off FMLA paperwork for Casey Harrell so she can help take care of her mother Casey Harrell.  Paperwork placed in red folder on desk. Please return when complete. Thank you.

## 2019-05-13 NOTE — Telephone Encounter (Signed)
Daughter Health and safety inspector) is calling in and is still having issues with [place] re FMLA. Mother has Covid and her situation is again needing to be addressed. She states time sensitive and wants a c b from Dr Senaida Lange nurse. # [phone]. Please FU

## 2019-05-14 NOTE — Telephone Encounter (Signed)
Called daughter of patient and let her know that I have faxed the FMLA paperwork to the requested number of [phone]. Tammy verbalized an understanding.

## 2019-05-19 ENCOUNTER — Other Ambulatory Visit: Payer: Self-pay | Admitting: Family Medicine

## 2019-05-19 DIAGNOSIS — U071 COVID-19: Secondary | ICD-10-CM | POA: Diagnosis not present

## 2019-05-19 DIAGNOSIS — G459 Transient cerebral ischemic attack, unspecified: Secondary | ICD-10-CM | POA: Diagnosis not present

## 2019-05-19 DIAGNOSIS — E876 Hypokalemia: Secondary | ICD-10-CM | POA: Diagnosis not present

## 2019-05-19 DIAGNOSIS — E785 Hyperlipidemia, unspecified: Secondary | ICD-10-CM | POA: Diagnosis not present

## 2019-05-19 DIAGNOSIS — E119 Type 2 diabetes mellitus without complications: Secondary | ICD-10-CM | POA: Diagnosis not present

## 2019-05-28 ENCOUNTER — Encounter: Payer: Self-pay | Admitting: Adult Health

## 2019-05-28 ENCOUNTER — Other Ambulatory Visit: Payer: Self-pay

## 2019-05-28 ENCOUNTER — Ambulatory Visit (INDEPENDENT_AMBULATORY_CARE_PROVIDER_SITE_OTHER): Payer: Medicare Other | Admitting: Adult Health

## 2019-05-28 VITALS — BP 154/78 | HR 53 | Temp 97.8°F | Ht 59.5 in | Wt 136.8 lb

## 2019-05-28 DIAGNOSIS — E785 Hyperlipidemia, unspecified: Secondary | ICD-10-CM | POA: Diagnosis not present

## 2019-05-28 DIAGNOSIS — I10 Essential (primary) hypertension: Secondary | ICD-10-CM

## 2019-05-28 DIAGNOSIS — E1165 Type 2 diabetes mellitus with hyperglycemia: Secondary | ICD-10-CM

## 2019-05-28 DIAGNOSIS — I63512 Cerebral infarction due to unspecified occlusion or stenosis of left middle cerebral artery: Secondary | ICD-10-CM

## 2019-05-28 DIAGNOSIS — F329 Major depressive disorder, single episode, unspecified: Secondary | ICD-10-CM

## 2019-05-28 DIAGNOSIS — R4701 Aphasia: Secondary | ICD-10-CM

## 2019-05-28 DIAGNOSIS — I6602 Occlusion and stenosis of left middle cerebral artery: Secondary | ICD-10-CM | POA: Diagnosis not present

## 2019-05-28 DIAGNOSIS — Z03818 Encounter for observation for suspected exposure to other biological agents ruled out: Secondary | ICD-10-CM | POA: Diagnosis not present

## 2019-05-28 DIAGNOSIS — I480 Paroxysmal atrial fibrillation: Secondary | ICD-10-CM

## 2019-05-28 MED ORDER — ASPIRIN EC 81 MG PO TBEC: 81.0000 mg | DELAYED_RELEASE_TABLET | Freq: Every day | ORAL | 3 refills | Status: DC

## 2019-05-28 MED ORDER — SERTRALINE HCL 25 MG PO TABS: 25.0000 mg | ORAL_TABLET | Freq: Every day | ORAL | 3 refills | Status: DC

## 2019-05-28 NOTE — Progress Notes (Signed)
[facility] [address] [place]. [place] [id number] [phone]       HOSPITAL FOLLOW UP NOTE  Ms. Atilano MedianKay F Nies Date of Birth:  October 07, 1934 Medical Record Number:  [id number]   Reason for Referral:  hospital stroke follow up    CHIEF COMPLAINT:  Chief Complaint  Patient presents with  . Hospitalization Follow-up    Daughter present. Rm 9. Patient's daughter mentioned that there has been a issues with her memory and her speech.     HPI: Casey Harrell being seen today for in office hospital follow-up regarding scattered left MCA infarcts due to chronic left MCA stenosis in setting of dehydration on 04/21/2019.  History obtained from patient, daughter and chart review. Reviewed all radiology images and labs personally.  Casey Harrell is a 85 y.o. female with history of AF on Eliquis, prior strokes, CKD stage III, DB, HTN  who [facility]21/[facility] with sudden onset speech difficulty (expressive aphasia).  Evaluated by Dr. Roda ShuttersXu and stroke team with stroke work-up revealing scattered left MCA infarcts as evidenced on MRI likely due to chronic left MCA stenosis in the setting of dehydration secondary to large vessel disease source.  CT head showed evidence of old bilateral occipital, bilateral basal ganglia, bilateral thalami and pontine infarcts.  MRA showed chronic severe left M1 stenosis.  Carotid Doppler unremarkable.  2D echo from 09/2018 showed EF greater than 65% without cardiac source of embolus identified.  Previously on Eliquis for history of atrial fibrillation and stroke prevention and recommended initiating aspirin 81 mg daily in addition to Eliquis.  Prior history of strokes 09/2018 right pontine infarct, 03/2017 left pontine infarct, 03/2015 patchy left MCA infarcts with left M1 stenosis and extension of left MCA infarction.  HTN stable.  LDL 95 and recommended increasing atorvastatin dose to 80 mg daily.  Uncontrolled DM with A1c 14.1 and  recommended close PCP follow-up for tighter control.  Also has history of diabetic peripheral neuropathy.  Other stroke risk factors include dehydration, advanced age, family history of stroke, PVD and history of CVA 06/2009.  Other active problems include hyponatremia, hypokalemia, hypothyroidism and leukocytosis.  Evaluated by therapies and was discharged [facility] on 04/24/2019.  Casey Harrell is a 85 year old female who is being seen today for hospital follow-up accompanied by her daughter.  She has returned back to [facility] approximately 2 weeks ago from [facility].  Unfortunately, due to COVID-19 restrictions, she has not been able to participate in any additional therapy.  She continues to have moderate expressive aphasia and worsening short-term memory.  Daughter does report mild short-term memory concerns prior but have worsened since recent stroke.  She is able to continue daily functioning without difficulty.  She does have assistance with medications and supervision with bathing and dressing due to safety concerns but this has been in place prior to her stroke.  She continues to use rolling walker without difficulty.  She has also been having difficulty with worsening depression and anxiety endorsing extreme frustration and anger at herself especially when she is not able to speak appropriately.  Underlying history of mild depression and anxiety especially after the passing of her husband almost 2 years ago of 65 years.  She is currently on duloxetine 30 mg twice daily but she is unsure if this is for neuropathy or depression.  She does endorse feelings that she would be better off dead but when she is questioned of suicidal  thoughts or ideations she denies.  She has continued on Eliquis but aspirin was discontinued at some point but denies bleeding and only mild bruising.  BP has been fluctuating and today's level 154/78.  Glucose levels continue to be monitored which have improved per  daughter.  Denies new or worsening stroke/TIA symptoms.      ROS:   14 system review of systems performed and negative with exception of depression, anxiety, speech difficulty  PMH:  Past Medical History:  Diagnosis Date  . ASTHMA 07/15/2008  . COPD (chronic obstructive pulmonary disease) (HCC)   . DIABETES-TYPE 2 07/15/2008  . DIABETIC PERIPHERAL NEUROPATHY 09/02/2009  . GERD 07/15/2008  . HYPERTENSION 07/15/2008  . Normal cardiac stress test    low risk Nuc 2009, Feb 2011  . Paroxysmal atrial fibrillation (HCC)   . PVD (peripheral vascular disease) (HCC) 06/18/09   RCE  . Stroke (HCC)   . Vertigo     PSH:  Past Surgical History:  Procedure Laterality Date  . ABDOMINAL HYSTERECTOMY  1971  . APPENDECTOMY  1971  . CAROTID ENDARTERECTOMY  06/18/09   RCE  . CESAREAN SECTION    . colectomy and colostomy    . FLEXIBLE SIGMOIDOSCOPY     diverticulitis  . FOOT SURGERY     right foot X 2  . LAPAROTOMY     X 3 with adhesions lysis  . TONSILLECTOMY    . TUBAL LIGATION      Social History:  Social History   Socioeconomic History  . Marital status: Married    Spouse name: Not on file  . Number of children: Y  . Years of education: Not on file  . Highest education level: Not on file  Occupational History  . Occupation: stay at home  Tobacco Use  . Smoking status: Never Smoker  . Smokeless tobacco: Never Used  Substance and Sexual Activity  . Alcohol use: No  . Drug use: No  . Sexual activity: Not on file  Other Topics Concern  . Not on file  Social History Narrative  . Not on file   Social Determinants of Health   Financial Resource Strain:   . Difficulty of Paying Living Expenses: Not on file  Food Insecurity:   . Worried About Programme researcher, broadcasting/film/video in the Last Year: Not on file  . Ran Out of Food in the Last Year: Not on file  Transportation Needs:   . Lack of Transportation (Medical): Not on file  . Lack of Transportation (Non-Medical): Not on file    Physical Activity:   . Days of Exercise per Week: Not on file  . Minutes of Exercise per Session: Not on file  Stress:   . Feeling of Stress : Not on file  Social Connections:   . Frequency of Communication with Friends and Family: Not on file  . Frequency of Social Gatherings with Friends and Family: Not on file  . Attends Religious Services: Not on file  . Active Member of Clubs or Organizations: Not on file  . Attends Banker Meetings: Not on file  . Marital Status: Not on file  Intimate Partner Violence:   . Fear of Current or Ex-Partner: Not on file  . Emotionally Abused: Not on file  . Physically Abused: Not on file  . Sexually Abused: Not on file    Family History:  Family History  Problem Relation Age of Onset  . Diabetes Mother   . Arthritis Mother   .  Heart disease Mother   . Heart disease Father   . Heart disease Sister   . Heart disease Brother   . Cancer Brother 60       lung cancer  . Celiac disease Sister   . Stroke Maternal Grandfather   . Cancer Paternal Grandfather        lung  . Heart disease Sister   . Asthma Sister     Medications:   Current Outpatient Medications on File Prior to Visit  Medication Sig Dispense Refill  . amLODipine (NORVASC) 5 MG tablet TAKE 1 TABLET BY MOUTH EVERY DAY 90 tablet 1  . apixaban (ELIQUIS) 5 MG TABS tablet Take 5 mg by mouth 2 (two) times daily.    Marland Kitchen atorvastatin (LIPITOR) 20 MG tablet Take 20 mg by mouth daily.    . Biotin 5000 MCG TABS Take 5,000 mcg by mouth every evening.     . Calcium Carb-Cholecalciferol (CALCIUM 600-D PO) Take 1 tablet by mouth every evening.    . Cholecalciferol (VITAMIN D3) 2000 units TABS Take 2,000 Units by mouth daily. Reported on 04/26/2015 (Patient taking differently: Take 2,000 Units by mouth every evening. Reported on 04/26/2015) 30 tablet 0  . DULoxetine (CYMBALTA) 30 MG capsule Take 30 mg by mouth 2 (two) times daily.    Marland Kitchen levothyroxine (SYNTHROID) 50 MCG tablet Take 50  mcg by mouth daily before breakfast.    . pantoprazole (PROTONIX) 40 MG tablet Take 40 mg by mouth 2 (two) times daily.     No current facility-administered medications on file prior to visit.    Allergies:   Allergies  Allergen Reactions  . Doxycycline Hyclate Nausea And Vomiting  . Morphine Sulfate Other (See Comments)    Causes hallucinations  . Iohexol Hives    Patient stated in 2010 this caused HIVES   . Moxifloxacin Other (See Comments)    Unknown reaction     Physical Exam  Vitals:   05/28/19 1412  BP: (!) 154/78  Pulse: (!) 53  Temp: 97.8 F (36.6 C)  TempSrc: Oral  Weight: 136 lb 12.8 oz (62.1 kg)  Height: 4' 11.5" (1.511 m)   Body mass index is 27.17 kg/m. No exam data present  Depression screen [facility] 2/9 05/28/2019  Decreased Interest 2  Down, Depressed, Hopeless 3  PHQ - 2 Score 5  Altered sleeping 0  Tired, decreased energy 0  Change in appetite 0  Feeling bad or failure about yourself  3  Trouble concentrating 3  Moving slowly or fidgety/restless 0  Suicidal thoughts 2  PHQ-9 Score 13  Difficult doing work/chores Extremely dIfficult  Some recent data might be hidden    GAD 7 : Generalized Anxiety Score 05/28/2019  Nervous, Anxious, on Edge 2  Control/stop worrying 0  Worry too much - different things 2  Trouble relaxing 0  Restless 1  Easily annoyed or irritable 0  Afraid - awful might happen 1  Total GAD 7 Score 6  Anxiety Difficulty Somewhat difficult    General: Frail very pleasant but anxious elderly Caucasian female, seated, in no evident distress but easily become tearful Head: head normocephalic and atraumatic.   Neck: supple with no carotid or supraclavicular bruits Cardiovascular: irregular rate and rhythm, no murmurs Musculoskeletal: no deformity Skin:  no rash/petichiae Vascular:  Normal pulses all extremities   Neurologic Exam Mental Status: Awake and fully alert.   Moderate expressive aphasia with frequent word finding  difficulty and mild dysarthria.  Oriented to place and  time. Recent memory impaired and remote memory intact. Attention span, concentration and fund of knowledge appropriate during visit.  Recall 0/3.  Animal naming 3 in 60 seconds of cognitive testing severely limited by patient's anxiety and increased frustration Cranial Nerves: Fundoscopic exam reveals sharp disc margins. Pupils equal, briskly reactive to light. Extraocular movements full without nystagmus. Visual fields full to confrontation. Hearing intact. Facial sensation intact.  Right lower facial weakness Motor: Normal bulk and tone. Normal strength in all tested extremity muscles except mild right hand decreased dexterity. Sensory.: intact to touch , pinprick , position and vibratory sensation.  Coordination: Rapid alternating movements normal in all extremities except decreased right hand dexterity. Finger-to-nose mild dysmetria right hand and heel-to-shin performed accurately bilaterally. Gait and Station: Arises from chair without difficulty. Stance is slightly hunched.  Gait demonstrates normal stride length and balance with use of Rollator walker Reflexes: 1+ and symmetric. Toes downgoing.     NIHSS  4 Modified Rankin  2 CHA2DS2-VASc 10 HAS-BLED 3   Diagnostic Data (Labs, Imaging, Testing)  CT HEAD WO CONTRAST 04/21/2019 IMPRESSION: No CT evidence of acute intracranial abnormality.  Redemonstrated chronic infarcts within bilateral occipital lobes.  Stable generalized parenchymal atrophy and chronic small vessel ischemic disease. Redemonstrated chronic lacunar infarcts in the bilateral basal ganglia, thalami and pons.   MR BRAIN WO CONTRAST MR MRA HEAD  04/21/2019 IMPRESSION: 1. Scattered small acute infarcts in the left MCA territory in the setting of chronic severe left M1 stenosis. No associated hemorrhage or mass effect. Study discussed by telephone with Neurology Dr. Laurence Slate on 04/21/2019 at 1339 hours. 2.  Otherwise stable advanced chronic intracranial ischemic disease. 3. Motion degraded MRA today appears otherwise stable since May this year.  VAS US CAROTID DUPLEX BILATERAL 04/22/2019 Summary: Right Carotid: Velocities in the right ICA are consistent with a 1-39% stenosis. Left Carotid: Velocities in the left ICA are consistent with a 1-39% stenosis. Vertebrals: Bilateral vertebral arteries demonstrate antegrade flow.     ASSESSMENT: Casey Harrell is a 85 y.o. year old female presented with expressive aphasia on 04/21/2019 with stroke work-up revealing scattered left MCA infarcts likely due to chronic left MCA stenosis in the setting of dehydration secondary to large vessel disease. Vascular risk factors include multiple prior strokes, AF on Eliquis, HTN, HLD, uncontrolled DM, PVD and history of CVA 06/2009.  Residual deficits of worsening short-term memory loss and worsening baseline depression and anxiety, moderate expressive aphasia, mild dysarthria, mild right facial droop and decreased right hand dexterity     PLAN:  1. Left MCA stroke: Continue Eliquis (apixaban) daily and restart aspirin 81 mg daily due to intracranial stenosis and continue Lipitor 80 mg daily for secondary stroke prevention. Maintain strict control of hypertension with blood pressure goal below 130/90, diabetes with hemoglobin A1c goal below 6.5% and cholesterol with LDL cholesterol (bad cholesterol) goal below 70 mg/dL.  I also advised the patient to eat a healthy diet with plenty of whole grains, cereals, fruits and vegetables, exercise regularly with at least 30 minutes of continuous activity daily and maintain ideal body weight. 2. Chronic severe left M1 stenosis: Was recommended to initiate aspirin 81 mg daily in addition to Eliquis at hospital discharge and apparently was discontinued at some point for unknown reasons.  Recommend restarting aspirin 81 mg daily for secondary stroke prevention along with highly  encouraging continuation of atorvastatin 80 mg daily and to avoid dehydration or hypotension 3. Atrial fibrillation: Continuation of Eliquis and ongoing follow-up with cardiology for  monitoring and management 4. HTN: Advised to continue current treatment regimen.  Today's BP elevated with daughter reporting fluctuations.  Recommended routine monitoring at home and to follow-up with PCP if remains elevated 5. HLD: Advised to continue current treatment regimen along with continued follow-up with PCP for future prescribing and monitoring of lipid panel 6. DMII: Advised to continue to monitor glucose levels at home along with continued follow-up with PCP for management and monitoring 7. Residual deficits poststroke: Unfortunately due to COVID-19 restrictions, patient is unable to participate in any type of therapies at this time.  Did discuss possible outpatient therapy but patient declines interest at this time.  Discussion regarding doing exercises at home as recommended during therapy during SNF stay and will consider initiating therapy once COVID-19 restrictions lifted if indicated at that time.  In regards to worsening short-term memory loss, recommended memory exercises such as crossword puzzles, word search, puzzles, reading and card games 8. Depression/anxiety, poststroke: Initiate sertraline 25 mg daily for worsening depression/anxiety since recent stroke.  She will likely benefit from psychology in the future but she would like to hold off at this time.  Reviewed potential adverse effects with patient and daughter verbalizing understanding and was provided education on AVS    Follow up in 2 months or call earlier if needed   Greater than 50% of time during this 45 minute visit was spent on counseling, explanation of diagnosis of left MCA stroke, reviewing risk factor management of chronic severe left M1 stenosis, atrial fibrillation on Eliquis, HTN, HLD, DM and multiple prior strokes, long  discussion regarding residual deficits and reactive depression/anxiety, planning of further management along with potential future management, and discussion with patient and family answering all questions.    Frann Rider, AGNP-BC  [facility] [address] [place], [place] [address]  Phone [phone] Fax [phone] Note: This document was prepared with digital dictation and possible smart phrase technology. Any transcriptional errors that result from this process are unintentional.

## 2019-05-28 NOTE — Patient Instructions (Addendum)
Recommend starting Zoloft 25 mg daily to help with depression and anxiety  Recommend memory exercises such as crossword puzzles, word search, puzzles, reading and card games to help with short-term memory concerns  Continue aspirin 81 mg daily and Eliquis (apixaban) daily  and Lipitor for secondary stroke prevention  Recommend restarting aspirin 81 mg daily due to vessel narrowing -please monitor for any worsening bruising or any concerning bleeding such as from your nose, urine or stool  Continue to follow up with PCP regarding cholesterol, blood pressure and diabetes management   Continue to monitor blood pressure at home  Maintain strict control of hypertension with blood pressure goal below 130/90, diabetes with hemoglobin A1c goal below 6.5% and cholesterol with LDL cholesterol (bad cholesterol) goal below 70 mg/dL. I also advised the patient to eat a healthy diet with plenty of whole grains, cereals, fruits and vegetables, exercise regularly and maintain ideal body weight.  Followup in the future with me in 2 months or call earlier if needed       Thank you for coming to see Casey Harrell at [facility]. I hope we have been able to provide you high quality care today.  You may receive a patient satisfaction survey over the next few weeks. We would appreciate your feedback and comments so that we may continue to improve ourselves and the health of our patients.   Sertraline tablets What is this medicine? SERTRALINE (SER tra leen) is used to treat depression. It may also be used to treat obsessive compulsive disorder, panic disorder, post-trauma stress, premenstrual dysphoric disorder (PMDD) or social anxiety. This medicine may be used for other purposes; ask your health care provider or pharmacist if you have questions. COMMON BRAND NAME(S): Zoloft What should I tell my health care provider before I take this medicine? They need to know if you have any of these  conditions:  bleeding disorders  bipolar disorder or a family history of bipolar disorder  glaucoma  heart disease  high blood pressure  history of irregular heartbeat  history of low levels of calcium, magnesium, or potassium in the blood  if you often drink alcohol  liver disease  receiving electroconvulsive therapy  seizures  suicidal thoughts, plans, or attempt; a previous suicide attempt by you or a family member  take medicines that treat or prevent blood clots  thyroid disease  an unusual or allergic reaction to sertraline, other medicines, foods, dyes, or preservatives  pregnant or trying to get pregnant  breast-feeding How should I use this medicine? Take this medicine by mouth with a glass of water. Follow the directions on the prescription label. You can take it with or without food. Take your medicine at regular intervals. Do not take your medicine more often than directed. Do not stop taking this medicine suddenly except upon the advice of your doctor. Stopping this medicine too quickly may cause serious side effects or your condition may worsen. A special MedGuide will be given to you by the pharmacist with each prescription and refill. Be sure to read this information carefully each time. Talk to your pediatrician regarding the use of this medicine in children. While this drug may be prescribed for children as young as 7 years for selected conditions, precautions do apply. Overdosage: If you think you have taken too much of this medicine contact a poison control center or emergency room at once. NOTE: This medicine is only for you. Do not share this medicine with others. What if I miss  a dose? If you miss a dose, take it as soon as you can. If it is almost time for your next dose, take only that dose. Do not take double or extra doses. What may interact with this medicine? Do not take this medicine with any of the following  medications:  cisapride  dronedarone  linezolid  MAOIs like Carbex, Eldepryl, Marplan, Nardil, and Parnate  methylene blue (injected into a vein)  pimozide  thioridazine This medicine may also interact with the following medications:  alcohol  amphetamines  aspirin and aspirin-like medicines  certain medicines for depression, anxiety, or psychotic disturbances  certain medicines for fungal infections like ketoconazole, fluconazole, posaconazole, and itraconazole  certain medicines for irregular heart beat like flecainide, quinidine, propafenone  certain medicines for migraine headaches like almotriptan, eletriptan, frovatriptan, naratriptan, rizatriptan, sumatriptan, zolmitriptan  certain medicines for sleep  certain medicines for seizures like carbamazepine, valproic acid, phenytoin  certain medicines that treat or prevent blood clots like warfarin, enoxaparin, dalteparin  cimetidine  digoxin  diuretics  fentanyl  isoniazid  lithium  NSAIDs, medicines for pain and inflammation, like ibuprofen or naproxen  other medicines that prolong the QT interval (cause an abnormal heart rhythm) like dofetilide  rasagiline  safinamide  supplements like St. John's wort, kava kava, valerian  tolbutamide  tramadol  tryptophan This list may not describe all possible interactions. Give your health care provider a list of all the medicines, herbs, non-prescription drugs, or dietary supplements you use. Also tell them if you smoke, drink alcohol, or use illegal drugs. Some items may interact with your medicine. What should I watch for while using this medicine? Tell your doctor if your symptoms do not get better or if they get worse. Visit your doctor or health care professional for regular checks on your progress. Because it may take several weeks to see the full effects of this medicine, it is important to continue your treatment as prescribed by your doctor. Patients  and their families should watch out for new or worsening thoughts of suicide or depression. Also watch out for sudden changes in feelings such as feeling anxious, agitated, panicky, irritable, hostile, aggressive, impulsive, severely restless, overly excited and hyperactive, or not being able to sleep. If this happens, especially at the beginning of treatment or after a change in dose, call your health care professional. Bonita Quin may get drowsy or dizzy. Do not drive, use machinery, or do anything that needs mental alertness until you know how this medicine affects you. Do not stand or sit up quickly, especially if you are an older patient. This reduces the risk of dizzy or fainting spells. Alcohol may interfere with the effect of this medicine. Avoid alcoholic drinks. Your mouth may get dry. Chewing sugarless gum or sucking hard candy, and drinking plenty of water may help. Contact your doctor if the problem does not go away or is severe. What side effects may I notice from receiving this medicine? Side effects that you should report to your doctor or health care professional as soon as possible:  allergic reactions like skin rash, itching or hives, swelling of the face, lips, or tongue  anxious  black, tarry stools  changes in vision  confusion  elevated mood, decreased need for sleep, racing thoughts, impulsive behavior  eye pain  fast, irregular heartbeat  feeling faint or lightheaded, falls  feeling agitated, angry, or irritable  hallucination, loss of contact with reality  loss of balance or coordination  loss of memory  painful  or prolonged erections  restlessness, pacing, inability to keep still  seizures  stiff muscles  suicidal thoughts or other mood changes  trouble sleeping  unusual bleeding or bruising  unusually weak or tired  vomiting Side effects that usually do not require medical attention (report to your doctor or health care professional if they  continue or are bothersome):  change in appetite or weight  change in sex drive or performance  diarrhea  increased sweating  indigestion, nausea  tremors This list may not describe all possible side effects. Call your doctor for medical advice about side effects. You may report side effects to FDA at 1-800-FDA-1088. Where should I keep my medicine? Keep out of the reach of children. Store at room temperature between 15 and 30 degrees C (59 and 86 degrees F). Throw away any unused medicine after the expiration date. NOTE: This sheet is a summary. It may not cover all possible information. If you have questions about this medicine, talk to your doctor, pharmacist, or health care provider.  2020 Elsevier/Gold Standard (2018-04-16 10:09:27)

## 2019-05-29 NOTE — Progress Notes (Signed)
I agree with the above plan 

## 2019-06-01 ENCOUNTER — Other Ambulatory Visit: Payer: Self-pay | Admitting: Family Medicine

## 2019-06-02 NOTE — Telephone Encounter (Signed)
Okay to continue this dosage? Last Lipid done in Hospital.

## 2019-06-02 NOTE — Telephone Encounter (Signed)
Refill for 6 months. 

## 2019-06-10 DIAGNOSIS — R278 Other lack of coordination: Secondary | ICD-10-CM | POA: Diagnosis not present

## 2019-06-10 DIAGNOSIS — M6281 Muscle weakness (generalized): Secondary | ICD-10-CM | POA: Diagnosis not present

## 2019-06-10 DIAGNOSIS — R2681 Unsteadiness on feet: Secondary | ICD-10-CM | POA: Diagnosis not present

## 2019-06-10 DIAGNOSIS — R260 Ataxic gait: Secondary | ICD-10-CM | POA: Diagnosis not present

## 2019-06-10 DIAGNOSIS — R488 Other symbolic dysfunctions: Secondary | ICD-10-CM | POA: Diagnosis not present

## 2019-06-10 DIAGNOSIS — I69398 Other sequelae of cerebral infarction: Secondary | ICD-10-CM | POA: Diagnosis not present

## 2019-06-11 DIAGNOSIS — I69328 Other speech and language deficits following cerebral infarction: Secondary | ICD-10-CM | POA: Diagnosis not present

## 2019-06-11 DIAGNOSIS — R488 Other symbolic dysfunctions: Secondary | ICD-10-CM | POA: Diagnosis not present

## 2019-06-11 DIAGNOSIS — I69398 Other sequelae of cerebral infarction: Secondary | ICD-10-CM | POA: Diagnosis not present

## 2019-06-11 DIAGNOSIS — R278 Other lack of coordination: Secondary | ICD-10-CM | POA: Diagnosis not present

## 2019-06-11 DIAGNOSIS — M6281 Muscle weakness (generalized): Secondary | ICD-10-CM | POA: Diagnosis not present

## 2019-06-11 DIAGNOSIS — R1312 Dysphagia, oropharyngeal phase: Secondary | ICD-10-CM | POA: Diagnosis not present

## 2019-06-12 DIAGNOSIS — I69398 Other sequelae of cerebral infarction: Secondary | ICD-10-CM | POA: Diagnosis not present

## 2019-06-12 DIAGNOSIS — M6281 Muscle weakness (generalized): Secondary | ICD-10-CM | POA: Diagnosis not present

## 2019-06-12 DIAGNOSIS — R488 Other symbolic dysfunctions: Secondary | ICD-10-CM | POA: Diagnosis not present

## 2019-06-12 DIAGNOSIS — R278 Other lack of coordination: Secondary | ICD-10-CM | POA: Diagnosis not present

## 2019-06-12 DIAGNOSIS — R2681 Unsteadiness on feet: Secondary | ICD-10-CM | POA: Diagnosis not present

## 2019-06-12 DIAGNOSIS — R260 Ataxic gait: Secondary | ICD-10-CM | POA: Diagnosis not present

## 2019-06-13 DIAGNOSIS — R2681 Unsteadiness on feet: Secondary | ICD-10-CM | POA: Diagnosis not present

## 2019-06-13 DIAGNOSIS — M6281 Muscle weakness (generalized): Secondary | ICD-10-CM | POA: Diagnosis not present

## 2019-06-13 DIAGNOSIS — R278 Other lack of coordination: Secondary | ICD-10-CM | POA: Diagnosis not present

## 2019-06-13 DIAGNOSIS — I69398 Other sequelae of cerebral infarction: Secondary | ICD-10-CM | POA: Diagnosis not present

## 2019-06-13 DIAGNOSIS — R260 Ataxic gait: Secondary | ICD-10-CM | POA: Diagnosis not present

## 2019-06-13 DIAGNOSIS — R488 Other symbolic dysfunctions: Secondary | ICD-10-CM | POA: Diagnosis not present

## 2019-06-16 DIAGNOSIS — R260 Ataxic gait: Secondary | ICD-10-CM | POA: Diagnosis not present

## 2019-06-16 DIAGNOSIS — M6281 Muscle weakness (generalized): Secondary | ICD-10-CM | POA: Diagnosis not present

## 2019-06-16 DIAGNOSIS — R1312 Dysphagia, oropharyngeal phase: Secondary | ICD-10-CM | POA: Diagnosis not present

## 2019-06-16 DIAGNOSIS — I69398 Other sequelae of cerebral infarction: Secondary | ICD-10-CM | POA: Diagnosis not present

## 2019-06-16 DIAGNOSIS — R2681 Unsteadiness on feet: Secondary | ICD-10-CM | POA: Diagnosis not present

## 2019-06-16 DIAGNOSIS — R488 Other symbolic dysfunctions: Secondary | ICD-10-CM | POA: Diagnosis not present

## 2019-06-16 DIAGNOSIS — I69328 Other speech and language deficits following cerebral infarction: Secondary | ICD-10-CM | POA: Diagnosis not present

## 2019-06-16 DIAGNOSIS — R278 Other lack of coordination: Secondary | ICD-10-CM | POA: Diagnosis not present

## 2019-06-17 DIAGNOSIS — R2681 Unsteadiness on feet: Secondary | ICD-10-CM | POA: Diagnosis not present

## 2019-06-17 DIAGNOSIS — R488 Other symbolic dysfunctions: Secondary | ICD-10-CM | POA: Diagnosis not present

## 2019-06-17 DIAGNOSIS — I69398 Other sequelae of cerebral infarction: Secondary | ICD-10-CM | POA: Diagnosis not present

## 2019-06-17 DIAGNOSIS — M6281 Muscle weakness (generalized): Secondary | ICD-10-CM | POA: Diagnosis not present

## 2019-06-17 DIAGNOSIS — R1312 Dysphagia, oropharyngeal phase: Secondary | ICD-10-CM | POA: Diagnosis not present

## 2019-06-17 DIAGNOSIS — R278 Other lack of coordination: Secondary | ICD-10-CM | POA: Diagnosis not present

## 2019-06-17 DIAGNOSIS — I69328 Other speech and language deficits following cerebral infarction: Secondary | ICD-10-CM | POA: Diagnosis not present

## 2019-06-17 DIAGNOSIS — R260 Ataxic gait: Secondary | ICD-10-CM | POA: Diagnosis not present

## 2019-06-18 DIAGNOSIS — R488 Other symbolic dysfunctions: Secondary | ICD-10-CM | POA: Diagnosis not present

## 2019-06-18 DIAGNOSIS — M6281 Muscle weakness (generalized): Secondary | ICD-10-CM | POA: Diagnosis not present

## 2019-06-18 DIAGNOSIS — I69398 Other sequelae of cerebral infarction: Secondary | ICD-10-CM | POA: Diagnosis not present

## 2019-06-18 DIAGNOSIS — R278 Other lack of coordination: Secondary | ICD-10-CM | POA: Diagnosis not present

## 2019-06-18 DIAGNOSIS — R260 Ataxic gait: Secondary | ICD-10-CM | POA: Diagnosis not present

## 2019-06-18 DIAGNOSIS — R2681 Unsteadiness on feet: Secondary | ICD-10-CM | POA: Diagnosis not present

## 2019-06-19 DIAGNOSIS — I69328 Other speech and language deficits following cerebral infarction: Secondary | ICD-10-CM | POA: Diagnosis not present

## 2019-06-19 DIAGNOSIS — I69398 Other sequelae of cerebral infarction: Secondary | ICD-10-CM | POA: Diagnosis not present

## 2019-06-19 DIAGNOSIS — R278 Other lack of coordination: Secondary | ICD-10-CM | POA: Diagnosis not present

## 2019-06-19 DIAGNOSIS — R260 Ataxic gait: Secondary | ICD-10-CM | POA: Diagnosis not present

## 2019-06-19 DIAGNOSIS — M6281 Muscle weakness (generalized): Secondary | ICD-10-CM | POA: Diagnosis not present

## 2019-06-19 DIAGNOSIS — R488 Other symbolic dysfunctions: Secondary | ICD-10-CM | POA: Diagnosis not present

## 2019-06-19 DIAGNOSIS — R1312 Dysphagia, oropharyngeal phase: Secondary | ICD-10-CM | POA: Diagnosis not present

## 2019-06-19 DIAGNOSIS — R2681 Unsteadiness on feet: Secondary | ICD-10-CM | POA: Diagnosis not present

## 2019-06-20 DIAGNOSIS — R278 Other lack of coordination: Secondary | ICD-10-CM | POA: Diagnosis not present

## 2019-06-20 DIAGNOSIS — M6281 Muscle weakness (generalized): Secondary | ICD-10-CM | POA: Diagnosis not present

## 2019-06-20 DIAGNOSIS — R488 Other symbolic dysfunctions: Secondary | ICD-10-CM | POA: Diagnosis not present

## 2019-06-20 DIAGNOSIS — I69398 Other sequelae of cerebral infarction: Secondary | ICD-10-CM | POA: Diagnosis not present

## 2019-06-23 DIAGNOSIS — M6281 Muscle weakness (generalized): Secondary | ICD-10-CM | POA: Diagnosis not present

## 2019-06-23 DIAGNOSIS — R488 Other symbolic dysfunctions: Secondary | ICD-10-CM | POA: Diagnosis not present

## 2019-06-23 DIAGNOSIS — R1312 Dysphagia, oropharyngeal phase: Secondary | ICD-10-CM | POA: Diagnosis not present

## 2019-06-23 DIAGNOSIS — R278 Other lack of coordination: Secondary | ICD-10-CM | POA: Diagnosis not present

## 2019-06-23 DIAGNOSIS — I69398 Other sequelae of cerebral infarction: Secondary | ICD-10-CM | POA: Diagnosis not present

## 2019-06-23 DIAGNOSIS — I69328 Other speech and language deficits following cerebral infarction: Secondary | ICD-10-CM | POA: Diagnosis not present

## 2019-06-24 DIAGNOSIS — R278 Other lack of coordination: Secondary | ICD-10-CM | POA: Diagnosis not present

## 2019-06-24 DIAGNOSIS — R488 Other symbolic dysfunctions: Secondary | ICD-10-CM | POA: Diagnosis not present

## 2019-06-24 DIAGNOSIS — I69398 Other sequelae of cerebral infarction: Secondary | ICD-10-CM | POA: Diagnosis not present

## 2019-06-24 DIAGNOSIS — M6281 Muscle weakness (generalized): Secondary | ICD-10-CM | POA: Diagnosis not present

## 2019-06-24 DIAGNOSIS — R260 Ataxic gait: Secondary | ICD-10-CM | POA: Diagnosis not present

## 2019-06-24 DIAGNOSIS — R2681 Unsteadiness on feet: Secondary | ICD-10-CM | POA: Diagnosis not present

## 2019-06-25 DIAGNOSIS — R278 Other lack of coordination: Secondary | ICD-10-CM | POA: Diagnosis not present

## 2019-06-25 DIAGNOSIS — I69398 Other sequelae of cerebral infarction: Secondary | ICD-10-CM | POA: Diagnosis not present

## 2019-06-25 DIAGNOSIS — R488 Other symbolic dysfunctions: Secondary | ICD-10-CM | POA: Diagnosis not present

## 2019-06-25 DIAGNOSIS — M6281 Muscle weakness (generalized): Secondary | ICD-10-CM | POA: Diagnosis not present

## 2019-06-26 DIAGNOSIS — R260 Ataxic gait: Secondary | ICD-10-CM | POA: Diagnosis not present

## 2019-06-26 DIAGNOSIS — R1312 Dysphagia, oropharyngeal phase: Secondary | ICD-10-CM | POA: Diagnosis not present

## 2019-06-26 DIAGNOSIS — R2681 Unsteadiness on feet: Secondary | ICD-10-CM | POA: Diagnosis not present

## 2019-06-26 DIAGNOSIS — I69328 Other speech and language deficits following cerebral infarction: Secondary | ICD-10-CM | POA: Diagnosis not present

## 2019-06-26 DIAGNOSIS — R278 Other lack of coordination: Secondary | ICD-10-CM | POA: Diagnosis not present

## 2019-06-26 DIAGNOSIS — M6281 Muscle weakness (generalized): Secondary | ICD-10-CM | POA: Diagnosis not present

## 2019-06-26 DIAGNOSIS — I69398 Other sequelae of cerebral infarction: Secondary | ICD-10-CM | POA: Diagnosis not present

## 2019-06-26 DIAGNOSIS — R488 Other symbolic dysfunctions: Secondary | ICD-10-CM | POA: Diagnosis not present

## 2019-06-27 DIAGNOSIS — R260 Ataxic gait: Secondary | ICD-10-CM | POA: Diagnosis not present

## 2019-06-27 DIAGNOSIS — R2681 Unsteadiness on feet: Secondary | ICD-10-CM | POA: Diagnosis not present

## 2019-06-27 DIAGNOSIS — R1312 Dysphagia, oropharyngeal phase: Secondary | ICD-10-CM | POA: Diagnosis not present

## 2019-06-27 DIAGNOSIS — I69328 Other speech and language deficits following cerebral infarction: Secondary | ICD-10-CM | POA: Diagnosis not present

## 2019-06-27 DIAGNOSIS — R488 Other symbolic dysfunctions: Secondary | ICD-10-CM | POA: Diagnosis not present

## 2019-06-27 DIAGNOSIS — M6281 Muscle weakness (generalized): Secondary | ICD-10-CM | POA: Diagnosis not present

## 2019-06-27 DIAGNOSIS — I69398 Other sequelae of cerebral infarction: Secondary | ICD-10-CM | POA: Diagnosis not present

## 2019-06-27 DIAGNOSIS — R278 Other lack of coordination: Secondary | ICD-10-CM | POA: Diagnosis not present

## 2019-06-30 DIAGNOSIS — I69398 Other sequelae of cerebral infarction: Secondary | ICD-10-CM | POA: Diagnosis not present

## 2019-06-30 DIAGNOSIS — M6281 Muscle weakness (generalized): Secondary | ICD-10-CM | POA: Diagnosis not present

## 2019-06-30 DIAGNOSIS — R278 Other lack of coordination: Secondary | ICD-10-CM | POA: Diagnosis not present

## 2019-06-30 DIAGNOSIS — R488 Other symbolic dysfunctions: Secondary | ICD-10-CM | POA: Diagnosis not present

## 2019-07-01 DIAGNOSIS — R260 Ataxic gait: Secondary | ICD-10-CM | POA: Diagnosis not present

## 2019-07-01 DIAGNOSIS — R2681 Unsteadiness on feet: Secondary | ICD-10-CM | POA: Diagnosis not present

## 2019-07-01 DIAGNOSIS — R1312 Dysphagia, oropharyngeal phase: Secondary | ICD-10-CM | POA: Diagnosis not present

## 2019-07-01 DIAGNOSIS — I69328 Other speech and language deficits following cerebral infarction: Secondary | ICD-10-CM | POA: Diagnosis not present

## 2019-07-01 DIAGNOSIS — R278 Other lack of coordination: Secondary | ICD-10-CM | POA: Diagnosis not present

## 2019-07-01 DIAGNOSIS — M6281 Muscle weakness (generalized): Secondary | ICD-10-CM | POA: Diagnosis not present

## 2019-07-01 DIAGNOSIS — I69398 Other sequelae of cerebral infarction: Secondary | ICD-10-CM | POA: Diagnosis not present

## 2019-07-01 DIAGNOSIS — R488 Other symbolic dysfunctions: Secondary | ICD-10-CM | POA: Diagnosis not present

## 2019-07-02 DIAGNOSIS — R488 Other symbolic dysfunctions: Secondary | ICD-10-CM | POA: Diagnosis not present

## 2019-07-02 DIAGNOSIS — R278 Other lack of coordination: Secondary | ICD-10-CM | POA: Diagnosis not present

## 2019-07-02 DIAGNOSIS — I69398 Other sequelae of cerebral infarction: Secondary | ICD-10-CM | POA: Diagnosis not present

## 2019-07-02 DIAGNOSIS — R2681 Unsteadiness on feet: Secondary | ICD-10-CM | POA: Diagnosis not present

## 2019-07-02 DIAGNOSIS — R260 Ataxic gait: Secondary | ICD-10-CM | POA: Diagnosis not present

## 2019-07-02 DIAGNOSIS — M6281 Muscle weakness (generalized): Secondary | ICD-10-CM | POA: Diagnosis not present

## 2019-07-03 DIAGNOSIS — R260 Ataxic gait: Secondary | ICD-10-CM | POA: Diagnosis not present

## 2019-07-03 DIAGNOSIS — I69328 Other speech and language deficits following cerebral infarction: Secondary | ICD-10-CM | POA: Diagnosis not present

## 2019-07-03 DIAGNOSIS — I69398 Other sequelae of cerebral infarction: Secondary | ICD-10-CM | POA: Diagnosis not present

## 2019-07-03 DIAGNOSIS — R2681 Unsteadiness on feet: Secondary | ICD-10-CM | POA: Diagnosis not present

## 2019-07-03 DIAGNOSIS — R488 Other symbolic dysfunctions: Secondary | ICD-10-CM | POA: Diagnosis not present

## 2019-07-03 DIAGNOSIS — R1312 Dysphagia, oropharyngeal phase: Secondary | ICD-10-CM | POA: Diagnosis not present

## 2019-07-03 DIAGNOSIS — M6281 Muscle weakness (generalized): Secondary | ICD-10-CM | POA: Diagnosis not present

## 2019-07-03 DIAGNOSIS — R278 Other lack of coordination: Secondary | ICD-10-CM | POA: Diagnosis not present

## 2019-07-04 DIAGNOSIS — I69328 Other speech and language deficits following cerebral infarction: Secondary | ICD-10-CM | POA: Diagnosis not present

## 2019-07-04 DIAGNOSIS — I69398 Other sequelae of cerebral infarction: Secondary | ICD-10-CM | POA: Diagnosis not present

## 2019-07-04 DIAGNOSIS — R488 Other symbolic dysfunctions: Secondary | ICD-10-CM | POA: Diagnosis not present

## 2019-07-04 DIAGNOSIS — R278 Other lack of coordination: Secondary | ICD-10-CM | POA: Diagnosis not present

## 2019-07-04 DIAGNOSIS — R1312 Dysphagia, oropharyngeal phase: Secondary | ICD-10-CM | POA: Diagnosis not present

## 2019-07-04 DIAGNOSIS — M6281 Muscle weakness (generalized): Secondary | ICD-10-CM | POA: Diagnosis not present

## 2019-07-07 DIAGNOSIS — R488 Other symbolic dysfunctions: Secondary | ICD-10-CM | POA: Diagnosis not present

## 2019-07-07 DIAGNOSIS — M6281 Muscle weakness (generalized): Secondary | ICD-10-CM | POA: Diagnosis not present

## 2019-07-07 DIAGNOSIS — R278 Other lack of coordination: Secondary | ICD-10-CM | POA: Diagnosis not present

## 2019-07-07 DIAGNOSIS — R1312 Dysphagia, oropharyngeal phase: Secondary | ICD-10-CM | POA: Diagnosis not present

## 2019-07-07 DIAGNOSIS — I69328 Other speech and language deficits following cerebral infarction: Secondary | ICD-10-CM | POA: Diagnosis not present

## 2019-07-07 DIAGNOSIS — I69398 Other sequelae of cerebral infarction: Secondary | ICD-10-CM | POA: Diagnosis not present

## 2019-07-08 DIAGNOSIS — M6281 Muscle weakness (generalized): Secondary | ICD-10-CM | POA: Diagnosis not present

## 2019-07-08 DIAGNOSIS — R2681 Unsteadiness on feet: Secondary | ICD-10-CM | POA: Diagnosis not present

## 2019-07-08 DIAGNOSIS — R278 Other lack of coordination: Secondary | ICD-10-CM | POA: Diagnosis not present

## 2019-07-08 DIAGNOSIS — I69398 Other sequelae of cerebral infarction: Secondary | ICD-10-CM | POA: Diagnosis not present

## 2019-07-08 DIAGNOSIS — R260 Ataxic gait: Secondary | ICD-10-CM | POA: Diagnosis not present

## 2019-07-08 DIAGNOSIS — R488 Other symbolic dysfunctions: Secondary | ICD-10-CM | POA: Diagnosis not present

## 2019-07-09 DIAGNOSIS — M6281 Muscle weakness (generalized): Secondary | ICD-10-CM | POA: Diagnosis not present

## 2019-07-09 DIAGNOSIS — I69398 Other sequelae of cerebral infarction: Secondary | ICD-10-CM | POA: Diagnosis not present

## 2019-07-09 DIAGNOSIS — R488 Other symbolic dysfunctions: Secondary | ICD-10-CM | POA: Diagnosis not present

## 2019-07-09 DIAGNOSIS — R1312 Dysphagia, oropharyngeal phase: Secondary | ICD-10-CM | POA: Diagnosis not present

## 2019-07-09 DIAGNOSIS — I69328 Other speech and language deficits following cerebral infarction: Secondary | ICD-10-CM | POA: Diagnosis not present

## 2019-07-09 DIAGNOSIS — R278 Other lack of coordination: Secondary | ICD-10-CM | POA: Diagnosis not present

## 2019-07-10 DIAGNOSIS — R278 Other lack of coordination: Secondary | ICD-10-CM | POA: Diagnosis not present

## 2019-07-10 DIAGNOSIS — R260 Ataxic gait: Secondary | ICD-10-CM | POA: Diagnosis not present

## 2019-07-10 DIAGNOSIS — I69398 Other sequelae of cerebral infarction: Secondary | ICD-10-CM | POA: Diagnosis not present

## 2019-07-10 DIAGNOSIS — R488 Other symbolic dysfunctions: Secondary | ICD-10-CM | POA: Diagnosis not present

## 2019-07-10 DIAGNOSIS — M6281 Muscle weakness (generalized): Secondary | ICD-10-CM | POA: Diagnosis not present

## 2019-07-10 DIAGNOSIS — R2681 Unsteadiness on feet: Secondary | ICD-10-CM | POA: Diagnosis not present

## 2019-07-11 DIAGNOSIS — I69398 Other sequelae of cerebral infarction: Secondary | ICD-10-CM | POA: Diagnosis not present

## 2019-07-11 DIAGNOSIS — M6281 Muscle weakness (generalized): Secondary | ICD-10-CM | POA: Diagnosis not present

## 2019-07-11 DIAGNOSIS — R488 Other symbolic dysfunctions: Secondary | ICD-10-CM | POA: Diagnosis not present

## 2019-07-11 DIAGNOSIS — R278 Other lack of coordination: Secondary | ICD-10-CM | POA: Diagnosis not present

## 2019-07-12 DIAGNOSIS — R260 Ataxic gait: Secondary | ICD-10-CM | POA: Diagnosis not present

## 2019-07-12 DIAGNOSIS — I69398 Other sequelae of cerebral infarction: Secondary | ICD-10-CM | POA: Diagnosis not present

## 2019-07-12 DIAGNOSIS — R1312 Dysphagia, oropharyngeal phase: Secondary | ICD-10-CM | POA: Diagnosis not present

## 2019-07-12 DIAGNOSIS — M6281 Muscle weakness (generalized): Secondary | ICD-10-CM | POA: Diagnosis not present

## 2019-07-12 DIAGNOSIS — R2681 Unsteadiness on feet: Secondary | ICD-10-CM | POA: Diagnosis not present

## 2019-07-12 DIAGNOSIS — I69328 Other speech and language deficits following cerebral infarction: Secondary | ICD-10-CM | POA: Diagnosis not present

## 2019-07-14 DIAGNOSIS — I69328 Other speech and language deficits following cerebral infarction: Secondary | ICD-10-CM | POA: Diagnosis not present

## 2019-07-14 DIAGNOSIS — R488 Other symbolic dysfunctions: Secondary | ICD-10-CM | POA: Diagnosis not present

## 2019-07-14 DIAGNOSIS — I69398 Other sequelae of cerebral infarction: Secondary | ICD-10-CM | POA: Diagnosis not present

## 2019-07-14 DIAGNOSIS — R260 Ataxic gait: Secondary | ICD-10-CM | POA: Diagnosis not present

## 2019-07-14 DIAGNOSIS — R278 Other lack of coordination: Secondary | ICD-10-CM | POA: Diagnosis not present

## 2019-07-14 DIAGNOSIS — M6281 Muscle weakness (generalized): Secondary | ICD-10-CM | POA: Diagnosis not present

## 2019-07-14 DIAGNOSIS — R2681 Unsteadiness on feet: Secondary | ICD-10-CM | POA: Diagnosis not present

## 2019-07-14 DIAGNOSIS — R1312 Dysphagia, oropharyngeal phase: Secondary | ICD-10-CM | POA: Diagnosis not present

## 2019-07-15 DIAGNOSIS — R488 Other symbolic dysfunctions: Secondary | ICD-10-CM | POA: Diagnosis not present

## 2019-07-15 DIAGNOSIS — I69398 Other sequelae of cerebral infarction: Secondary | ICD-10-CM | POA: Diagnosis not present

## 2019-07-15 DIAGNOSIS — M6281 Muscle weakness (generalized): Secondary | ICD-10-CM | POA: Diagnosis not present

## 2019-07-15 DIAGNOSIS — R278 Other lack of coordination: Secondary | ICD-10-CM | POA: Diagnosis not present

## 2019-07-16 DIAGNOSIS — R278 Other lack of coordination: Secondary | ICD-10-CM | POA: Diagnosis not present

## 2019-07-16 DIAGNOSIS — R2681 Unsteadiness on feet: Secondary | ICD-10-CM | POA: Diagnosis not present

## 2019-07-16 DIAGNOSIS — R260 Ataxic gait: Secondary | ICD-10-CM | POA: Diagnosis not present

## 2019-07-16 DIAGNOSIS — M6281 Muscle weakness (generalized): Secondary | ICD-10-CM | POA: Diagnosis not present

## 2019-07-16 DIAGNOSIS — I69328 Other speech and language deficits following cerebral infarction: Secondary | ICD-10-CM | POA: Diagnosis not present

## 2019-07-16 DIAGNOSIS — I69398 Other sequelae of cerebral infarction: Secondary | ICD-10-CM | POA: Diagnosis not present

## 2019-07-16 DIAGNOSIS — R488 Other symbolic dysfunctions: Secondary | ICD-10-CM | POA: Diagnosis not present

## 2019-07-16 DIAGNOSIS — R1312 Dysphagia, oropharyngeal phase: Secondary | ICD-10-CM | POA: Diagnosis not present

## 2019-07-17 DIAGNOSIS — R2681 Unsteadiness on feet: Secondary | ICD-10-CM | POA: Diagnosis not present

## 2019-07-17 DIAGNOSIS — M6281 Muscle weakness (generalized): Secondary | ICD-10-CM | POA: Diagnosis not present

## 2019-07-17 DIAGNOSIS — I69398 Other sequelae of cerebral infarction: Secondary | ICD-10-CM | POA: Diagnosis not present

## 2019-07-17 DIAGNOSIS — R260 Ataxic gait: Secondary | ICD-10-CM | POA: Diagnosis not present

## 2019-07-17 DIAGNOSIS — R278 Other lack of coordination: Secondary | ICD-10-CM | POA: Diagnosis not present

## 2019-07-17 DIAGNOSIS — R488 Other symbolic dysfunctions: Secondary | ICD-10-CM | POA: Diagnosis not present

## 2019-07-18 DIAGNOSIS — M6281 Muscle weakness (generalized): Secondary | ICD-10-CM | POA: Diagnosis not present

## 2019-07-18 DIAGNOSIS — R1312 Dysphagia, oropharyngeal phase: Secondary | ICD-10-CM | POA: Diagnosis not present

## 2019-07-18 DIAGNOSIS — I69398 Other sequelae of cerebral infarction: Secondary | ICD-10-CM | POA: Diagnosis not present

## 2019-07-18 DIAGNOSIS — R488 Other symbolic dysfunctions: Secondary | ICD-10-CM | POA: Diagnosis not present

## 2019-07-18 DIAGNOSIS — I69328 Other speech and language deficits following cerebral infarction: Secondary | ICD-10-CM | POA: Diagnosis not present

## 2019-07-18 DIAGNOSIS — R278 Other lack of coordination: Secondary | ICD-10-CM | POA: Diagnosis not present

## 2019-07-21 DIAGNOSIS — R278 Other lack of coordination: Secondary | ICD-10-CM | POA: Diagnosis not present

## 2019-07-21 DIAGNOSIS — M6281 Muscle weakness (generalized): Secondary | ICD-10-CM | POA: Diagnosis not present

## 2019-07-21 DIAGNOSIS — R488 Other symbolic dysfunctions: Secondary | ICD-10-CM | POA: Diagnosis not present

## 2019-07-21 DIAGNOSIS — I69328 Other speech and language deficits following cerebral infarction: Secondary | ICD-10-CM | POA: Diagnosis not present

## 2019-07-21 DIAGNOSIS — R260 Ataxic gait: Secondary | ICD-10-CM | POA: Diagnosis not present

## 2019-07-21 DIAGNOSIS — R2681 Unsteadiness on feet: Secondary | ICD-10-CM | POA: Diagnosis not present

## 2019-07-21 DIAGNOSIS — I69398 Other sequelae of cerebral infarction: Secondary | ICD-10-CM | POA: Diagnosis not present

## 2019-07-21 DIAGNOSIS — R1312 Dysphagia, oropharyngeal phase: Secondary | ICD-10-CM | POA: Diagnosis not present

## 2019-07-22 DIAGNOSIS — R278 Other lack of coordination: Secondary | ICD-10-CM | POA: Diagnosis not present

## 2019-07-22 DIAGNOSIS — R260 Ataxic gait: Secondary | ICD-10-CM | POA: Diagnosis not present

## 2019-07-22 DIAGNOSIS — I69328 Other speech and language deficits following cerebral infarction: Secondary | ICD-10-CM | POA: Diagnosis not present

## 2019-07-22 DIAGNOSIS — R2681 Unsteadiness on feet: Secondary | ICD-10-CM | POA: Diagnosis not present

## 2019-07-22 DIAGNOSIS — R1312 Dysphagia, oropharyngeal phase: Secondary | ICD-10-CM | POA: Diagnosis not present

## 2019-07-22 DIAGNOSIS — I69398 Other sequelae of cerebral infarction: Secondary | ICD-10-CM | POA: Diagnosis not present

## 2019-07-22 DIAGNOSIS — R488 Other symbolic dysfunctions: Secondary | ICD-10-CM | POA: Diagnosis not present

## 2019-07-22 DIAGNOSIS — M6281 Muscle weakness (generalized): Secondary | ICD-10-CM | POA: Diagnosis not present

## 2019-07-23 DIAGNOSIS — I69328 Other speech and language deficits following cerebral infarction: Secondary | ICD-10-CM | POA: Diagnosis not present

## 2019-07-23 DIAGNOSIS — M6281 Muscle weakness (generalized): Secondary | ICD-10-CM | POA: Diagnosis not present

## 2019-07-23 DIAGNOSIS — I69398 Other sequelae of cerebral infarction: Secondary | ICD-10-CM | POA: Diagnosis not present

## 2019-07-23 DIAGNOSIS — R278 Other lack of coordination: Secondary | ICD-10-CM | POA: Diagnosis not present

## 2019-07-23 DIAGNOSIS — R1312 Dysphagia, oropharyngeal phase: Secondary | ICD-10-CM | POA: Diagnosis not present

## 2019-07-23 DIAGNOSIS — R488 Other symbolic dysfunctions: Secondary | ICD-10-CM | POA: Diagnosis not present

## 2019-07-24 ENCOUNTER — Other Ambulatory Visit: Payer: Self-pay | Admitting: *Deleted

## 2019-07-24 DIAGNOSIS — R260 Ataxic gait: Secondary | ICD-10-CM | POA: Diagnosis not present

## 2019-07-24 DIAGNOSIS — M6281 Muscle weakness (generalized): Secondary | ICD-10-CM | POA: Diagnosis not present

## 2019-07-24 DIAGNOSIS — I69398 Other sequelae of cerebral infarction: Secondary | ICD-10-CM | POA: Diagnosis not present

## 2019-07-24 DIAGNOSIS — R278 Other lack of coordination: Secondary | ICD-10-CM | POA: Diagnosis not present

## 2019-07-24 DIAGNOSIS — R488 Other symbolic dysfunctions: Secondary | ICD-10-CM | POA: Diagnosis not present

## 2019-07-24 DIAGNOSIS — R2681 Unsteadiness on feet: Secondary | ICD-10-CM | POA: Diagnosis not present

## 2019-07-24 MED ORDER — SERTRALINE HCL 25 MG PO TABS: 25.0000 mg | ORAL_TABLET | Freq: Every day | ORAL | 1 refills | Status: AC

## 2019-07-25 DIAGNOSIS — I69398 Other sequelae of cerebral infarction: Secondary | ICD-10-CM | POA: Diagnosis not present

## 2019-07-25 DIAGNOSIS — R278 Other lack of coordination: Secondary | ICD-10-CM | POA: Diagnosis not present

## 2019-07-25 DIAGNOSIS — M6281 Muscle weakness (generalized): Secondary | ICD-10-CM | POA: Diagnosis not present

## 2019-07-25 DIAGNOSIS — R488 Other symbolic dysfunctions: Secondary | ICD-10-CM | POA: Diagnosis not present

## 2019-07-26 DIAGNOSIS — E1165 Type 2 diabetes mellitus with hyperglycemia: Secondary | ICD-10-CM | POA: Diagnosis not present

## 2019-07-28 DIAGNOSIS — Z794 Long term (current) use of insulin: Secondary | ICD-10-CM | POA: Diagnosis not present

## 2019-07-28 DIAGNOSIS — M6281 Muscle weakness (generalized): Secondary | ICD-10-CM | POA: Diagnosis not present

## 2019-07-28 DIAGNOSIS — I10 Essential (primary) hypertension: Secondary | ICD-10-CM | POA: Diagnosis not present

## 2019-07-28 DIAGNOSIS — E785 Hyperlipidemia, unspecified: Secondary | ICD-10-CM | POA: Diagnosis not present

## 2019-07-28 DIAGNOSIS — I69398 Other sequelae of cerebral infarction: Secondary | ICD-10-CM | POA: Diagnosis not present

## 2019-07-28 DIAGNOSIS — Z8673 Personal history of transient ischemic attack (TIA), and cerebral infarction without residual deficits: Secondary | ICD-10-CM | POA: Diagnosis not present

## 2019-07-28 DIAGNOSIS — R260 Ataxic gait: Secondary | ICD-10-CM | POA: Diagnosis not present

## 2019-07-28 DIAGNOSIS — R488 Other symbolic dysfunctions: Secondary | ICD-10-CM | POA: Diagnosis not present

## 2019-07-28 DIAGNOSIS — R278 Other lack of coordination: Secondary | ICD-10-CM | POA: Diagnosis not present

## 2019-07-28 DIAGNOSIS — R2681 Unsteadiness on feet: Secondary | ICD-10-CM | POA: Diagnosis not present

## 2019-07-28 DIAGNOSIS — I69328 Other speech and language deficits following cerebral infarction: Secondary | ICD-10-CM | POA: Diagnosis not present

## 2019-07-28 DIAGNOSIS — E1169 Type 2 diabetes mellitus with other specified complication: Secondary | ICD-10-CM | POA: Diagnosis not present

## 2019-07-28 DIAGNOSIS — R1312 Dysphagia, oropharyngeal phase: Secondary | ICD-10-CM | POA: Diagnosis not present

## 2019-07-28 DIAGNOSIS — I480 Paroxysmal atrial fibrillation: Secondary | ICD-10-CM | POA: Diagnosis not present

## 2019-07-29 DIAGNOSIS — R1312 Dysphagia, oropharyngeal phase: Secondary | ICD-10-CM | POA: Diagnosis not present

## 2019-07-29 DIAGNOSIS — R278 Other lack of coordination: Secondary | ICD-10-CM | POA: Diagnosis not present

## 2019-07-29 DIAGNOSIS — I69398 Other sequelae of cerebral infarction: Secondary | ICD-10-CM | POA: Diagnosis not present

## 2019-07-29 DIAGNOSIS — M6281 Muscle weakness (generalized): Secondary | ICD-10-CM | POA: Diagnosis not present

## 2019-07-29 DIAGNOSIS — I69328 Other speech and language deficits following cerebral infarction: Secondary | ICD-10-CM | POA: Diagnosis not present

## 2019-07-29 DIAGNOSIS — R488 Other symbolic dysfunctions: Secondary | ICD-10-CM | POA: Diagnosis not present

## 2019-07-30 DIAGNOSIS — R278 Other lack of coordination: Secondary | ICD-10-CM | POA: Diagnosis not present

## 2019-07-30 DIAGNOSIS — M6281 Muscle weakness (generalized): Secondary | ICD-10-CM | POA: Diagnosis not present

## 2019-07-30 DIAGNOSIS — R260 Ataxic gait: Secondary | ICD-10-CM | POA: Diagnosis not present

## 2019-07-30 DIAGNOSIS — I69398 Other sequelae of cerebral infarction: Secondary | ICD-10-CM | POA: Diagnosis not present

## 2019-07-30 DIAGNOSIS — R2681 Unsteadiness on feet: Secondary | ICD-10-CM | POA: Diagnosis not present

## 2019-07-30 DIAGNOSIS — R488 Other symbolic dysfunctions: Secondary | ICD-10-CM | POA: Diagnosis not present

## 2019-07-31 ENCOUNTER — Ambulatory Visit: Payer: Medicare Other | Admitting: Adult Health

## 2019-07-31 DIAGNOSIS — R278 Other lack of coordination: Secondary | ICD-10-CM | POA: Diagnosis not present

## 2019-07-31 DIAGNOSIS — R488 Other symbolic dysfunctions: Secondary | ICD-10-CM | POA: Diagnosis not present

## 2019-07-31 DIAGNOSIS — M6281 Muscle weakness (generalized): Secondary | ICD-10-CM | POA: Diagnosis not present

## 2019-07-31 DIAGNOSIS — I69398 Other sequelae of cerebral infarction: Secondary | ICD-10-CM | POA: Diagnosis not present

## 2019-08-01 DIAGNOSIS — R278 Other lack of coordination: Secondary | ICD-10-CM | POA: Diagnosis not present

## 2019-08-01 DIAGNOSIS — R488 Other symbolic dysfunctions: Secondary | ICD-10-CM | POA: Diagnosis not present

## 2019-08-01 DIAGNOSIS — I69398 Other sequelae of cerebral infarction: Secondary | ICD-10-CM | POA: Diagnosis not present

## 2019-08-01 DIAGNOSIS — M6281 Muscle weakness (generalized): Secondary | ICD-10-CM | POA: Diagnosis not present

## 2019-08-01 DIAGNOSIS — R260 Ataxic gait: Secondary | ICD-10-CM | POA: Diagnosis not present

## 2019-08-01 DIAGNOSIS — R2681 Unsteadiness on feet: Secondary | ICD-10-CM | POA: Diagnosis not present

## 2019-08-02 DIAGNOSIS — M6281 Muscle weakness (generalized): Secondary | ICD-10-CM | POA: Diagnosis not present

## 2019-08-02 DIAGNOSIS — R2681 Unsteadiness on feet: Secondary | ICD-10-CM | POA: Diagnosis not present

## 2019-08-02 DIAGNOSIS — R260 Ataxic gait: Secondary | ICD-10-CM | POA: Diagnosis not present

## 2019-08-02 DIAGNOSIS — I69398 Other sequelae of cerebral infarction: Secondary | ICD-10-CM | POA: Diagnosis not present

## 2019-08-04 DIAGNOSIS — M6281 Muscle weakness (generalized): Secondary | ICD-10-CM | POA: Diagnosis not present

## 2019-08-04 DIAGNOSIS — R260 Ataxic gait: Secondary | ICD-10-CM | POA: Diagnosis not present

## 2019-08-04 DIAGNOSIS — I69328 Other speech and language deficits following cerebral infarction: Secondary | ICD-10-CM | POA: Diagnosis not present

## 2019-08-04 DIAGNOSIS — R278 Other lack of coordination: Secondary | ICD-10-CM | POA: Diagnosis not present

## 2019-08-04 DIAGNOSIS — R1312 Dysphagia, oropharyngeal phase: Secondary | ICD-10-CM | POA: Diagnosis not present

## 2019-08-04 DIAGNOSIS — R2681 Unsteadiness on feet: Secondary | ICD-10-CM | POA: Diagnosis not present

## 2019-08-04 DIAGNOSIS — I69398 Other sequelae of cerebral infarction: Secondary | ICD-10-CM | POA: Diagnosis not present

## 2019-08-04 DIAGNOSIS — R488 Other symbolic dysfunctions: Secondary | ICD-10-CM | POA: Diagnosis not present

## 2019-08-05 DIAGNOSIS — I69328 Other speech and language deficits following cerebral infarction: Secondary | ICD-10-CM | POA: Diagnosis not present

## 2019-08-05 DIAGNOSIS — R488 Other symbolic dysfunctions: Secondary | ICD-10-CM | POA: Diagnosis not present

## 2019-08-05 DIAGNOSIS — R278 Other lack of coordination: Secondary | ICD-10-CM | POA: Diagnosis not present

## 2019-08-05 DIAGNOSIS — I69398 Other sequelae of cerebral infarction: Secondary | ICD-10-CM | POA: Diagnosis not present

## 2019-08-05 DIAGNOSIS — R1312 Dysphagia, oropharyngeal phase: Secondary | ICD-10-CM | POA: Diagnosis not present

## 2019-08-05 DIAGNOSIS — M6281 Muscle weakness (generalized): Secondary | ICD-10-CM | POA: Diagnosis not present

## 2019-08-06 DIAGNOSIS — R2681 Unsteadiness on feet: Secondary | ICD-10-CM | POA: Diagnosis not present

## 2019-08-06 DIAGNOSIS — R260 Ataxic gait: Secondary | ICD-10-CM | POA: Diagnosis not present

## 2019-08-06 DIAGNOSIS — I69398 Other sequelae of cerebral infarction: Secondary | ICD-10-CM | POA: Diagnosis not present

## 2019-08-06 DIAGNOSIS — M6281 Muscle weakness (generalized): Secondary | ICD-10-CM | POA: Diagnosis not present

## 2019-08-06 DIAGNOSIS — I69328 Other speech and language deficits following cerebral infarction: Secondary | ICD-10-CM | POA: Diagnosis not present

## 2019-08-06 DIAGNOSIS — R488 Other symbolic dysfunctions: Secondary | ICD-10-CM | POA: Diagnosis not present

## 2019-08-06 DIAGNOSIS — R278 Other lack of coordination: Secondary | ICD-10-CM | POA: Diagnosis not present

## 2019-08-06 DIAGNOSIS — R1312 Dysphagia, oropharyngeal phase: Secondary | ICD-10-CM | POA: Diagnosis not present

## 2019-08-07 DIAGNOSIS — I69398 Other sequelae of cerebral infarction: Secondary | ICD-10-CM | POA: Diagnosis not present

## 2019-08-07 DIAGNOSIS — R260 Ataxic gait: Secondary | ICD-10-CM | POA: Diagnosis not present

## 2019-08-07 DIAGNOSIS — R278 Other lack of coordination: Secondary | ICD-10-CM | POA: Diagnosis not present

## 2019-08-07 DIAGNOSIS — M6281 Muscle weakness (generalized): Secondary | ICD-10-CM | POA: Diagnosis not present

## 2019-08-07 DIAGNOSIS — R2681 Unsteadiness on feet: Secondary | ICD-10-CM | POA: Diagnosis not present

## 2019-08-07 DIAGNOSIS — R488 Other symbolic dysfunctions: Secondary | ICD-10-CM | POA: Diagnosis not present

## 2019-08-08 DIAGNOSIS — M6281 Muscle weakness (generalized): Secondary | ICD-10-CM | POA: Diagnosis not present

## 2019-08-08 DIAGNOSIS — R488 Other symbolic dysfunctions: Secondary | ICD-10-CM | POA: Diagnosis not present

## 2019-08-08 DIAGNOSIS — I69398 Other sequelae of cerebral infarction: Secondary | ICD-10-CM | POA: Diagnosis not present

## 2019-08-08 DIAGNOSIS — R278 Other lack of coordination: Secondary | ICD-10-CM | POA: Diagnosis not present

## 2019-08-11 DIAGNOSIS — I69398 Other sequelae of cerebral infarction: Secondary | ICD-10-CM | POA: Diagnosis not present

## 2019-08-11 DIAGNOSIS — R2681 Unsteadiness on feet: Secondary | ICD-10-CM | POA: Diagnosis not present

## 2019-08-11 DIAGNOSIS — R278 Other lack of coordination: Secondary | ICD-10-CM | POA: Diagnosis not present

## 2019-08-11 DIAGNOSIS — I69328 Other speech and language deficits following cerebral infarction: Secondary | ICD-10-CM | POA: Diagnosis not present

## 2019-08-11 DIAGNOSIS — M6281 Muscle weakness (generalized): Secondary | ICD-10-CM | POA: Diagnosis not present

## 2019-08-11 DIAGNOSIS — R260 Ataxic gait: Secondary | ICD-10-CM | POA: Diagnosis not present

## 2019-08-11 DIAGNOSIS — R488 Other symbolic dysfunctions: Secondary | ICD-10-CM | POA: Diagnosis not present

## 2019-08-11 DIAGNOSIS — R1312 Dysphagia, oropharyngeal phase: Secondary | ICD-10-CM | POA: Diagnosis not present

## 2019-08-12 DIAGNOSIS — I69398 Other sequelae of cerebral infarction: Secondary | ICD-10-CM | POA: Diagnosis not present

## 2019-08-12 DIAGNOSIS — I69328 Other speech and language deficits following cerebral infarction: Secondary | ICD-10-CM | POA: Diagnosis not present

## 2019-08-12 DIAGNOSIS — R278 Other lack of coordination: Secondary | ICD-10-CM | POA: Diagnosis not present

## 2019-08-12 DIAGNOSIS — R1312 Dysphagia, oropharyngeal phase: Secondary | ICD-10-CM | POA: Diagnosis not present

## 2019-08-12 DIAGNOSIS — M6281 Muscle weakness (generalized): Secondary | ICD-10-CM | POA: Diagnosis not present

## 2019-08-12 DIAGNOSIS — R488 Other symbolic dysfunctions: Secondary | ICD-10-CM | POA: Diagnosis not present

## 2019-08-13 DIAGNOSIS — I69398 Other sequelae of cerebral infarction: Secondary | ICD-10-CM | POA: Diagnosis not present

## 2019-08-13 DIAGNOSIS — M6281 Muscle weakness (generalized): Secondary | ICD-10-CM | POA: Diagnosis not present

## 2019-08-13 DIAGNOSIS — R488 Other symbolic dysfunctions: Secondary | ICD-10-CM | POA: Diagnosis not present

## 2019-08-13 DIAGNOSIS — R278 Other lack of coordination: Secondary | ICD-10-CM | POA: Diagnosis not present

## 2019-08-14 DIAGNOSIS — R260 Ataxic gait: Secondary | ICD-10-CM | POA: Diagnosis not present

## 2019-08-14 DIAGNOSIS — R278 Other lack of coordination: Secondary | ICD-10-CM | POA: Diagnosis not present

## 2019-08-14 DIAGNOSIS — R488 Other symbolic dysfunctions: Secondary | ICD-10-CM | POA: Diagnosis not present

## 2019-08-14 DIAGNOSIS — I69328 Other speech and language deficits following cerebral infarction: Secondary | ICD-10-CM | POA: Diagnosis not present

## 2019-08-14 DIAGNOSIS — I69398 Other sequelae of cerebral infarction: Secondary | ICD-10-CM | POA: Diagnosis not present

## 2019-08-14 DIAGNOSIS — R1312 Dysphagia, oropharyngeal phase: Secondary | ICD-10-CM | POA: Diagnosis not present

## 2019-08-14 DIAGNOSIS — M6281 Muscle weakness (generalized): Secondary | ICD-10-CM | POA: Diagnosis not present

## 2019-08-14 DIAGNOSIS — R2681 Unsteadiness on feet: Secondary | ICD-10-CM | POA: Diagnosis not present

## 2019-08-15 DIAGNOSIS — R260 Ataxic gait: Secondary | ICD-10-CM | POA: Diagnosis not present

## 2019-08-15 DIAGNOSIS — R2681 Unsteadiness on feet: Secondary | ICD-10-CM | POA: Diagnosis not present

## 2019-08-15 DIAGNOSIS — R488 Other symbolic dysfunctions: Secondary | ICD-10-CM | POA: Diagnosis not present

## 2019-08-15 DIAGNOSIS — M6281 Muscle weakness (generalized): Secondary | ICD-10-CM | POA: Diagnosis not present

## 2019-08-15 DIAGNOSIS — R278 Other lack of coordination: Secondary | ICD-10-CM | POA: Diagnosis not present

## 2019-08-15 DIAGNOSIS — I69398 Other sequelae of cerebral infarction: Secondary | ICD-10-CM | POA: Diagnosis not present

## 2019-08-18 DIAGNOSIS — I69398 Other sequelae of cerebral infarction: Secondary | ICD-10-CM | POA: Diagnosis not present

## 2019-08-18 DIAGNOSIS — M6281 Muscle weakness (generalized): Secondary | ICD-10-CM | POA: Diagnosis not present

## 2019-08-18 DIAGNOSIS — R2681 Unsteadiness on feet: Secondary | ICD-10-CM | POA: Diagnosis not present

## 2019-08-18 DIAGNOSIS — R260 Ataxic gait: Secondary | ICD-10-CM | POA: Diagnosis not present

## 2019-08-19 DIAGNOSIS — I69398 Other sequelae of cerebral infarction: Secondary | ICD-10-CM | POA: Diagnosis not present

## 2019-08-19 DIAGNOSIS — I69328 Other speech and language deficits following cerebral infarction: Secondary | ICD-10-CM | POA: Diagnosis not present

## 2019-08-19 DIAGNOSIS — R1312 Dysphagia, oropharyngeal phase: Secondary | ICD-10-CM | POA: Diagnosis not present

## 2019-08-21 DIAGNOSIS — I69398 Other sequelae of cerebral infarction: Secondary | ICD-10-CM | POA: Diagnosis not present

## 2019-08-21 DIAGNOSIS — R1312 Dysphagia, oropharyngeal phase: Secondary | ICD-10-CM | POA: Diagnosis not present

## 2019-08-21 DIAGNOSIS — I69328 Other speech and language deficits following cerebral infarction: Secondary | ICD-10-CM | POA: Diagnosis not present

## 2019-08-24 ENCOUNTER — Other Ambulatory Visit: Payer: Self-pay | Admitting: Adult Health

## 2019-09-08 ENCOUNTER — Telehealth: Payer: Self-pay | Admitting: Family Medicine

## 2019-09-08 NOTE — Chronic Care Management (AMB) (Signed)
°  Chronic Care Management   Outreach Note  09/08/2019 Name: Casey Harrell MRN: [id number] DOB: 1935/04/29  Referred by: [name], MD Reason for referral : Chronic Care Management   An unsuccessful telephone outreach was attempted today. The patient was referred to the pharmacist for assistance with care management and care coordination.   Follow Up Plan:   Myra Cody  Upstream Scheduler

## 2019-09-10 ENCOUNTER — Emergency Department (HOSPITAL_COMMUNITY): Payer: Medicare Other

## 2019-09-10 ENCOUNTER — Encounter (HOSPITAL_COMMUNITY): Payer: Self-pay | Admitting: Emergency Medicine

## 2019-09-10 ENCOUNTER — Emergency Department (HOSPITAL_COMMUNITY)
Admission: EM | Admit: 2019-09-10 | Discharge: 2019-09-11 | Disposition: A | Discharge status: Skilled Nursing Facility | Payer: Medicare Other | Attending: Emergency Medicine | Admitting: Emergency Medicine

## 2019-09-10 ENCOUNTER — Other Ambulatory Visit: Payer: Self-pay

## 2019-09-10 DIAGNOSIS — Z7984 Long term (current) use of oral hypoglycemic drugs: Secondary | ICD-10-CM | POA: Insufficient documentation

## 2019-09-10 DIAGNOSIS — M25551 Pain in right hip: Secondary | ICD-10-CM | POA: Insufficient documentation

## 2019-09-10 DIAGNOSIS — R52 Pain, unspecified: Secondary | ICD-10-CM | POA: Diagnosis not present

## 2019-09-10 DIAGNOSIS — W500XXA Accidental hit or strike by another person, initial encounter: Secondary | ICD-10-CM | POA: Diagnosis not present

## 2019-09-10 DIAGNOSIS — Z79899 Other long term (current) drug therapy: Secondary | ICD-10-CM | POA: Insufficient documentation

## 2019-09-10 DIAGNOSIS — W19XXXA Unspecified fall, initial encounter: Secondary | ICD-10-CM

## 2019-09-10 DIAGNOSIS — E1140 Type 2 diabetes mellitus with diabetic neuropathy, unspecified: Secondary | ICD-10-CM | POA: Insufficient documentation

## 2019-09-10 DIAGNOSIS — S00511A Abrasion of lip, initial encounter: Secondary | ICD-10-CM | POA: Diagnosis not present

## 2019-09-10 DIAGNOSIS — Y92128 Other place in nursing home as the place of occurrence of the external cause: Secondary | ICD-10-CM | POA: Diagnosis not present

## 2019-09-10 DIAGNOSIS — E1165 Type 2 diabetes mellitus with hyperglycemia: Secondary | ICD-10-CM | POA: Diagnosis not present

## 2019-09-10 DIAGNOSIS — Z7901 Long term (current) use of anticoagulants: Secondary | ICD-10-CM | POA: Insufficient documentation

## 2019-09-10 DIAGNOSIS — I480 Paroxysmal atrial fibrillation: Secondary | ICD-10-CM | POA: Diagnosis not present

## 2019-09-10 DIAGNOSIS — Y999 Unspecified external cause status: Secondary | ICD-10-CM | POA: Diagnosis not present

## 2019-09-10 DIAGNOSIS — Y9389 Activity, other specified: Secondary | ICD-10-CM | POA: Insufficient documentation

## 2019-09-10 DIAGNOSIS — S79911A Unspecified injury of right hip, initial encounter: Secondary | ICD-10-CM | POA: Diagnosis not present

## 2019-09-10 DIAGNOSIS — S0990XA Unspecified injury of head, initial encounter: Secondary | ICD-10-CM | POA: Diagnosis not present

## 2019-09-10 DIAGNOSIS — Z743 Need for continuous supervision: Secondary | ICD-10-CM | POA: Diagnosis not present

## 2019-09-10 NOTE — ED Triage Notes (Signed)
Patient from [facility] greens. While walking outside with her walker, she collided with another resident. She fell on her right hip. Pain in the right hip has since subsided. Patient takes Eliquis so the facility want to have her evaluated. Patient did not hit her head.   EMS vitals: 513 CBG 158/86 BP 92 HR 16 Resp Rate 96% O2 sat on room air

## 2019-09-10 NOTE — ED Notes (Signed)
Patient ambulated to bathroom.

## 2019-09-10 NOTE — ED Notes (Signed)
[employer] contacted. Paperwork completed and printed.

## 2019-09-10 NOTE — ED Notes (Signed)
PT return from CT

## 2019-09-10 NOTE — ED Provider Notes (Signed)
[facility]-EMERGENCY DEPT Provider Note   CSN: [id number] Arrival date & time: 09/10/19  1824     History Chief Complaint  Patient presents with  . Fall    Casey Harrell is a 85 y.o. female.  The history is provided by the patient and medical records. No language interpreter was used.  Fall This is a new problem. The current episode started less than 1 hour ago. The problem occurs rarely. The problem has not changed since onset.Pertinent negatives include no chest pain, no abdominal pain, no headaches and no shortness of breath. Nothing aggravates the symptoms. Nothing relieves the symptoms. She has tried nothing for the symptoms. The treatment provided no relief.       Past Medical History:  Diagnosis Date  . ASTHMA 07/15/2008  . COPD (chronic obstructive pulmonary disease) (HCC)   . DIABETES-TYPE 2 07/15/2008  . DIABETIC PERIPHERAL NEUROPATHY 09/02/2009  . GERD 07/15/2008  . HYPERTENSION 07/15/2008  . Normal cardiac stress test    low risk Nuc 2009, Feb 2011  . Paroxysmal atrial fibrillation (HCC)   . PVD (peripheral vascular disease) (HCC) 06/18/09   RCE  . Stroke (HCC)   . Vertigo     Patient Active Problem List   Diagnosis Date Noted  . Ischemic stroke (HCC) 04/21/2019  . Labile blood pressure   . Labile blood glucose   . AKI (acute kidney injury) (HCC)   . Diabetes mellitus type 2 in nonobese (HCC)   . PAF (paroxysmal atrial fibrillation) (HCC)   . Reactive hypertension   . Right pontine CVA (HCC) 10/08/2018  . COPD (chronic obstructive pulmonary disease) (HCC) 10/03/2018  . 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 Left pontine cerebrovascular accident (HCC) 04/03/2017  . History of CVA with residual deficit   . DM (diabetes mellitus) (HCC)   . Anemia 03/30/2017  . History of stroke   . Chest pain 02/09/2016  . BPPV (benign paroxysmal positional vertigo) 08/25/2015  . HLD (hyperlipidemia) 08/25/2015  . At high risk for falls  07/01/2015  . Vertigo 06/21/2015  . Paroxysmal atrial fibrillation (HCC) 06/11/2015  . Cerebral infarction due to stenosis of left middle cerebral artery (HCC) 05/20/2015  . Intracranial vascular stenosis 05/20/2015  . Type 2 diabetes mellitus with circulatory disorder (HCC) 05/20/2015  . Type 2 diabetes, uncontrolled, with neuropathy (HCC)   . Abdominal pain   . Partial small bowel obstruction (HCC)   . Benign essential HTN   . Small bowel obstruction (HCC) 05/07/2015  . Hemiplegia, unspecified affecting right dominant side (HCC) 04/08/2015  . Embolic stroke involving posterior cerebral artery (HCC) 04/05/2015  . Orthostatic hypotension   . Hypertensive urgency 04/01/2015  . Fever in adult 04/01/2015  . Acute encephalopathy 04/01/2015  . Confusion   . CVA (cerebral infarction)   . Posterior circulation stroke (HCC)   . Acute ischemic stroke (HCC) 03/31/2015  . Stroke (HCC) 03/31/2015  . CKD (chronic kidney disease) stage 3, GFR 30-59 ml/min 04/17/2014  . Gastroenteritis, non-infectious 04/11/2014  . Nausea vomiting and diarrhea 04/10/2014  . Hypokalemia 04/10/2014  . Dehydration 04/09/2014  . Bronchitis with airway obstruction (HCC) 02/03/2013  . Fever 02/03/2013  . Acute respiratory failure with hypoxia (HCC) 02/03/2013  . Depression 02/03/2013  . Obesity (BMI 30-39.9) 01/10/2013  . PVD (peripheral vascular disease)- S/P RCE 06/18/09 12/13/2012  . Sleep apnea- C-pap intol 12/13/2012  . Low risk cardiac nuclear stress test- 2009 and Feb 2011 12/13/2012  . Mild persistent asthma 11/29/2011  .  Chronic cough 06/05/2011  . Diabetic neuropathy (HCC) 09/02/2009  . Dyslipidemia 07/13/2009  . Hypothyroidism 11/12/2008  . Type 2 diabetes mellitus, uncontrolled (HCC) 07/15/2008  . Essential hypertension 07/15/2008  . GERD 07/15/2008  . DIVERTICULOSIS, COLON 07/15/2008    Past Surgical History:  Procedure Laterality Date  . ABDOMINAL HYSTERECTOMY  1971  . APPENDECTOMY  1971  .  CAROTID ENDARTERECTOMY  06/18/09   RCE  . CESAREAN SECTION    . colectomy and colostomy    . FLEXIBLE SIGMOIDOSCOPY     diverticulitis  . FOOT SURGERY     right foot X 2  . LAPAROTOMY     X 3 with adhesions lysis  . TONSILLECTOMY    . TUBAL LIGATION       OB History   No obstetric history on file.     Family History  Problem Relation Age of Onset  . Diabetes Mother   . Arthritis Mother   . Heart disease Mother   . Heart disease Father   . Heart disease Sister   . Heart disease Brother   . Cancer Brother 60       lung cancer  . Celiac disease Sister   . Stroke Maternal Grandfather   . Cancer Paternal Grandfather        lung  . Heart disease Sister   . Asthma Sister     Social History   Tobacco Use  . Smoking status: Never Smoker  . Smokeless tobacco: Never Used  Substance Use Topics  . Alcohol use: No  . Drug use: No    Home Medications Prior to Admission medications   Medication Sig Start Date End Date Taking? Authorizing Provider  amLODipine (NORVASC) 5 MG tablet TAKE 1 TABLET BY MOUTH EVERY DAY 05/19/19   Burchette, Elberta Fortis, MD  apixaban (ELIQUIS) 5 MG TABS tablet Take 5 mg by mouth 2 (two) times daily.    [provider]  aspirin (ASPIRIN LOW DOSE) 81 MG EC tablet Take 1 tablet (81 mg total) by mouth daily. Appointment needed for further refills. 08/25/19   Ihor Austin, NP  atorvastatin (LIPITOR) 20 MG tablet TAKE 1 TABLET BY MOUTH EVERY DAY 06/03/19   Burchette, Elberta Fortis, MD  Biotin 5000 MCG TABS Take 5,000 mcg by mouth every evening.     [provider]  Calcium Carb-Cholecalciferol (CALCIUM 600-D PO) Take 1 tablet by mouth every evening.    [provider]  Cholecalciferol (VITAMIN D3) 2000 units TABS Take 2,000 Units by mouth daily. Reported on 04/26/2015 Patient taking differently: Take 2,000 Units by mouth every evening. Reported on 04/26/2015 04/13/17   Angiulli, Mcarthur Rossetti, PA-C  DULoxetine (CYMBALTA) 30 MG capsule Take 30 mg  by mouth 2 (two) times daily.    [provider]  levothyroxine (SYNTHROID) 50 MCG tablet Take 50 mcg by mouth daily before breakfast.    [provider]  pantoprazole (PROTONIX) 40 MG tablet Take 40 mg by mouth 2 (two) times daily.    [provider]  sertraline (ZOLOFT) 25 MG tablet Take 1 tablet (25 mg total) by mouth daily. 07/24/19   Ihor Austin, NP    Allergies    Doxycycline hyclate, Morphine sulfate, Iohexol, and Moxifloxacin  Review of Systems   Review of Systems  Constitutional: Negative for chills, diaphoresis, fatigue and fever.  HENT: Negative for congestion.   Eyes: Negative for visual disturbance.  Respiratory: Negative for cough, chest tightness, shortness of breath and wheezing.   Cardiovascular: Negative  for chest pain, palpitations and leg swelling.  Gastrointestinal: Negative for abdominal pain, constipation, diarrhea, nausea and vomiting.  Genitourinary: Negative for dysuria and flank pain.  Musculoskeletal: Negative for back pain, neck pain and neck stiffness.  Skin: Negative for rash and wound.  Neurological: Negative for dizziness, weakness, light-headedness, numbness and headaches.  Psychiatric/Behavioral: Negative for agitation.  All other systems reviewed and are negative.   Physical Exam Updated Vital Signs There were no vitals taken for this visit.  Physical Exam Vitals and nursing note reviewed.  Constitutional:      General: She is not in acute distress.    Appearance: She is well-developed. She is not ill-appearing, toxic-appearing or diaphoretic.  HENT:     Head: Normocephalic and atraumatic.     Nose: Nose normal. No congestion or rhinorrhea.     Mouth/Throat:     Mouth: Mucous membranes are moist.     Pharynx: No oropharyngeal exudate or posterior oropharyngeal erythema.  Eyes:     Extraocular Movements: Extraocular movements intact.     Conjunctiva/sclera: Conjunctivae normal.     Pupils: Pupils are equal,  round, and reactive to light.  Cardiovascular:     Rate and Rhythm: Normal rate and regular rhythm.     Heart sounds: No murmur.  Pulmonary:     Effort: Pulmonary effort is normal. No respiratory distress.     Breath sounds: Normal breath sounds. No wheezing, rhonchi or rales.  Chest:     Chest wall: No tenderness.  Abdominal:     General: Abdomen is flat.     Palpations: Abdomen is soft.     Tenderness: There is no abdominal tenderness. There is no right CVA tenderness, left CVA tenderness, guarding or rebound.  Musculoskeletal:        General: No tenderness.     Cervical back: Neck supple. No tenderness.     Right lower leg: No edema.     Left lower leg: No edema.  Skin:    General: Skin is warm and dry.     Capillary Refill: Capillary refill takes less than 2 seconds.     Findings: No erythema.  Neurological:     General: No focal deficit present.     Mental Status: She is alert.  Psychiatric:        Mood and Affect: Mood normal.     ED Results / Procedures / Treatments   Labs (all labs ordered are listed, but only abnormal results are displayed) Labs Reviewed - No data to display  EKG None  Radiology CT Head Wo Contrast  Result Date: 09/10/2019 CLINICAL DATA:  85 year old female with head trauma. EXAM: CT HEAD WITHOUT CONTRAST TECHNIQUE: Contiguous axial images were obtained from the base of the skull through the vertex without intravenous contrast. COMPARISON:  Head CT dated 04/21/2019. FINDINGS: Brain: There is mild age-related atrophy and moderate chronic microvascular ischemic changes. Left occipital old infarct. There is no acute intracranial hemorrhage. No mass effect or midline shift. No extra-axial fluid collection. Vascular: No hyperdense vessel or unexpected calcification. Skull: Normal. Negative for fracture or focal lesion. Sinuses/Orbits: No acute finding. Other: None IMPRESSION: 1. No acute intracranial pathology. 2. Age-related atrophy and chronic  microvascular ischemic changes. Old left occipital infarct. Electronically Signed   By: Elgie Collard M.D.   On: 09/10/2019 20:23   DG Hip Unilat W or Wo Pelvis 2-3 Views Right  Result Date: 09/10/2019 CLINICAL DATA:  Mechanical fall with right hip pain EXAM: DG HIP (WITH OR  WITHOUT PELVIS) 2-3V RIGHT COMPARISON:  CT 03/16/2019 FINDINGS: SI joints are non widened. Clips in the pelvis. Pubic symphysis and rami appear intact. No fracture or malalignment. Joint space is maintained. IMPRESSION: No acute osseous abnormality Electronically Signed   By: Donavan Foil M.D.   On: 09/10/2019 18:55    Procedures Procedures (including critical care time)  Medications Ordered in ED Medications - No data to display  ED Course  I have reviewed the triage vital signs and the nursing notes.  Pertinent labs & imaging results that were available during my care of the patient were reviewed by me and considered in my medical decision making (see chart for details).    MDM Rules/Calculators/A&P                      Casey Harrell is a 85 y.o. female with a past medical history significant for paroxysmal atrial fibrillation on Eliquis therapy, prior strokes, CKD, PVD, hypertension, diabetes, prior small bowel obstruction, vertigo, COPD, and diverticulosis who presents with a mechanical fall.  According to EMS, patient was walking outside her facility when she ran into somebody else causing him to fall the ground.  She initially was complaining of right hip pain and was sent to the emergency department evaluation.  She is on blood thinners but denies hitting her head or neck.  The fall was witnessed and she did not lose consciousness.  She reports no preceding symptoms whatsoever.  She actually reports her pain has improved significantly since arrival to the emergency department.  She denies any chest pain, shortness breath, palpitations.  She denies any vision changes, nausea, vomiting.  She denies any headache, neck  pain, neck stiffness.  No other back pains.  No other extremity pains aside from the mild right hip pain that has resolved.  No other complaints on arrival.  On exam, patient did not have any tenderness of the right hip.  She could move her leg normally.  Good strength and sensation in the legs.  Normal pulses in extremities.  Patient did have some weakness in her right grip strength compared to left which she reports that her baseline.  Patient has a small abrasion to her upper lip which was from the fall however patient denies any face or headache.  She does not want CT of her head at this time.  Neck was nontender with normal range of motion.  Lungs clear and chest and back nontender.  Patient otherwise very well-appearing on exam.  We agreed to get a x-ray of the right hip and pelvis to make sure there are no fractures but if the imaging is negative and patient is able to ambulate safely without pain, she will be discharged home.  She agrees with holding other work-up and she does not want CT head given her lack of other symptoms.  Anticipate reassessment after imaging.    7:46 PM Patient reports she does think she hit her head and would like imaging of the head as she is on blood thinners.  Hip x-ray returned reassuring.  If CT head is reassuring, anticipate discharge.  CT head and hip imaging reassuring.  Patient will be discharged back to facility after reassuring work-up after mechanical fall.  Patient agrees and was discharged in good condition.   Final Clinical Impression(s) / ED Diagnoses Final diagnoses:  Fall, initial encounter    Rx / DC Orders ED Discharge Orders    None  Clinical Impression: 1. Fall, initial encounter     Disposition: Discharge  Condition: Good  I have discussed the results, Dx and Tx plan with the pt(& family if present). He/she/they expressed understanding and agree(s) with the plan. Discharge instructions discussed at great length. Strict  return precautions discussed and pt &/or family have verbalized understanding of the instructions. No further questions at time of discharge.    Discharge Medication List as of 09/10/2019 10:31 PM      Follow Up: Kristian CoveyBurchette, Bruce W, MD [address] [place] [place] [address] [phone]     [facility] [address] [id number] [place] [id number] [phone]       Lowell Mcgurk, Canary Brimhristopher J, MD 09/11/19 [id number]

## 2019-09-10 NOTE — Discharge Instructions (Signed)
Your work-up and imaging today showed no evidence of fracture in your hip and no injury to your head.  As your symptoms have resolved, we feel you are safe for discharge home.  Please follow-up with your primary doctor and rest and stay hydrated.  If any symptoms change or worsen, please return to the nearest emergency department.

## 2019-09-11 DIAGNOSIS — Z743 Need for continuous supervision: Secondary | ICD-10-CM | POA: Diagnosis not present

## 2019-09-11 DIAGNOSIS — R5381 Other malaise: Secondary | ICD-10-CM | POA: Diagnosis not present

## 2019-09-11 DIAGNOSIS — Z7401 Bed confinement status: Secondary | ICD-10-CM | POA: Diagnosis not present

## 2019-09-11 DIAGNOSIS — M2550 Pain in unspecified joint: Secondary | ICD-10-CM | POA: Diagnosis not present

## 2019-09-23 DIAGNOSIS — R296 Repeated falls: Secondary | ICD-10-CM | POA: Diagnosis not present

## 2019-09-23 DIAGNOSIS — R488 Other symbolic dysfunctions: Secondary | ICD-10-CM | POA: Diagnosis not present

## 2019-09-25 DIAGNOSIS — R488 Other symbolic dysfunctions: Secondary | ICD-10-CM | POA: Diagnosis not present

## 2019-09-25 DIAGNOSIS — R296 Repeated falls: Secondary | ICD-10-CM | POA: Diagnosis not present

## 2019-09-26 DIAGNOSIS — R488 Other symbolic dysfunctions: Secondary | ICD-10-CM | POA: Diagnosis not present

## 2019-09-26 DIAGNOSIS — R296 Repeated falls: Secondary | ICD-10-CM | POA: Diagnosis not present

## 2019-09-29 DIAGNOSIS — R488 Other symbolic dysfunctions: Secondary | ICD-10-CM | POA: Diagnosis not present

## 2019-09-29 DIAGNOSIS — R296 Repeated falls: Secondary | ICD-10-CM | POA: Diagnosis not present

## 2019-09-30 DIAGNOSIS — R296 Repeated falls: Secondary | ICD-10-CM | POA: Diagnosis not present

## 2019-10-01 DIAGNOSIS — M6281 Muscle weakness (generalized): Secondary | ICD-10-CM | POA: Diagnosis not present

## 2019-10-01 DIAGNOSIS — R296 Repeated falls: Secondary | ICD-10-CM | POA: Diagnosis not present

## 2019-10-01 DIAGNOSIS — R2681 Unsteadiness on feet: Secondary | ICD-10-CM | POA: Diagnosis not present

## 2019-10-01 DIAGNOSIS — R488 Other symbolic dysfunctions: Secondary | ICD-10-CM | POA: Diagnosis not present

## 2019-10-03 DIAGNOSIS — R296 Repeated falls: Secondary | ICD-10-CM | POA: Diagnosis not present

## 2019-10-03 DIAGNOSIS — R488 Other symbolic dysfunctions: Secondary | ICD-10-CM | POA: Diagnosis not present

## 2019-10-04 DIAGNOSIS — R2681 Unsteadiness on feet: Secondary | ICD-10-CM | POA: Diagnosis not present

## 2019-10-04 DIAGNOSIS — M6281 Muscle weakness (generalized): Secondary | ICD-10-CM | POA: Diagnosis not present

## 2019-10-04 DIAGNOSIS — R296 Repeated falls: Secondary | ICD-10-CM | POA: Diagnosis not present

## 2019-10-07 DIAGNOSIS — M6281 Muscle weakness (generalized): Secondary | ICD-10-CM | POA: Diagnosis not present

## 2019-10-07 DIAGNOSIS — R296 Repeated falls: Secondary | ICD-10-CM | POA: Diagnosis not present

## 2019-10-07 DIAGNOSIS — R2681 Unsteadiness on feet: Secondary | ICD-10-CM | POA: Diagnosis not present

## 2019-10-07 DIAGNOSIS — R488 Other symbolic dysfunctions: Secondary | ICD-10-CM | POA: Diagnosis not present

## 2019-10-08 DIAGNOSIS — R2681 Unsteadiness on feet: Secondary | ICD-10-CM | POA: Diagnosis not present

## 2019-10-08 DIAGNOSIS — R296 Repeated falls: Secondary | ICD-10-CM | POA: Diagnosis not present

## 2019-10-08 DIAGNOSIS — R488 Other symbolic dysfunctions: Secondary | ICD-10-CM | POA: Diagnosis not present

## 2019-10-08 DIAGNOSIS — M6281 Muscle weakness (generalized): Secondary | ICD-10-CM | POA: Diagnosis not present

## 2019-10-09 DIAGNOSIS — R2681 Unsteadiness on feet: Secondary | ICD-10-CM | POA: Diagnosis not present

## 2019-10-09 DIAGNOSIS — R488 Other symbolic dysfunctions: Secondary | ICD-10-CM | POA: Diagnosis not present

## 2019-10-09 DIAGNOSIS — M6281 Muscle weakness (generalized): Secondary | ICD-10-CM | POA: Diagnosis not present

## 2019-10-09 DIAGNOSIS — R296 Repeated falls: Secondary | ICD-10-CM | POA: Diagnosis not present

## 2019-10-10 DIAGNOSIS — R296 Repeated falls: Secondary | ICD-10-CM | POA: Diagnosis not present

## 2019-10-13 ENCOUNTER — Telehealth: Payer: Self-pay | Admitting: Family Medicine

## 2019-10-13 DIAGNOSIS — R488 Other symbolic dysfunctions: Secondary | ICD-10-CM | POA: Diagnosis not present

## 2019-10-13 DIAGNOSIS — M6281 Muscle weakness (generalized): Secondary | ICD-10-CM | POA: Diagnosis not present

## 2019-10-13 DIAGNOSIS — R296 Repeated falls: Secondary | ICD-10-CM | POA: Diagnosis not present

## 2019-10-13 DIAGNOSIS — R2681 Unsteadiness on feet: Secondary | ICD-10-CM | POA: Diagnosis not present

## 2019-10-13 NOTE — Progress Notes (Signed)
  Chronic Care Management   Outreach Note  10/13/2019 Name: RITU GAGLIARDO MRN: [id number] DOB: [birth year]  Referred by: [name], MD Reason for referral : No chief complaint on file.   A second unsuccessful telephone outreach was attempted today. The patient was referred to pharmacist for assistance with care management and care coordination.  Follow Up Plan:   Alvie Heidelberg Upstream Scheduler

## 2019-10-14 DIAGNOSIS — R296 Repeated falls: Secondary | ICD-10-CM | POA: Diagnosis not present

## 2019-10-14 DIAGNOSIS — R488 Other symbolic dysfunctions: Secondary | ICD-10-CM | POA: Diagnosis not present

## 2019-10-15 ENCOUNTER — Other Ambulatory Visit: Payer: Self-pay | Admitting: Family Medicine

## 2019-10-15 DIAGNOSIS — R488 Other symbolic dysfunctions: Secondary | ICD-10-CM | POA: Diagnosis not present

## 2019-10-15 DIAGNOSIS — R296 Repeated falls: Secondary | ICD-10-CM | POA: Diagnosis not present

## 2019-10-15 NOTE — Telephone Encounter (Signed)
Please advise do not see that we prescribed before

## 2019-10-15 NOTE — Telephone Encounter (Signed)
Refill for 6 months. 

## 2019-10-16 DIAGNOSIS — M6281 Muscle weakness (generalized): Secondary | ICD-10-CM | POA: Diagnosis not present

## 2019-10-16 DIAGNOSIS — R296 Repeated falls: Secondary | ICD-10-CM | POA: Diagnosis not present

## 2019-10-16 DIAGNOSIS — R2681 Unsteadiness on feet: Secondary | ICD-10-CM | POA: Diagnosis not present

## 2019-10-17 DIAGNOSIS — R296 Repeated falls: Secondary | ICD-10-CM | POA: Diagnosis not present

## 2019-10-17 DIAGNOSIS — R2681 Unsteadiness on feet: Secondary | ICD-10-CM | POA: Diagnosis not present

## 2019-10-17 DIAGNOSIS — M6281 Muscle weakness (generalized): Secondary | ICD-10-CM | POA: Diagnosis not present

## 2019-10-20 DIAGNOSIS — R488 Other symbolic dysfunctions: Secondary | ICD-10-CM | POA: Diagnosis not present

## 2019-10-20 DIAGNOSIS — R296 Repeated falls: Secondary | ICD-10-CM | POA: Diagnosis not present

## 2019-10-22 ENCOUNTER — Telehealth: Payer: Self-pay | Admitting: Family Medicine

## 2019-10-22 DIAGNOSIS — R296 Repeated falls: Secondary | ICD-10-CM | POA: Diagnosis not present

## 2019-10-22 DIAGNOSIS — R2681 Unsteadiness on feet: Secondary | ICD-10-CM | POA: Diagnosis not present

## 2019-10-22 DIAGNOSIS — M6281 Muscle weakness (generalized): Secondary | ICD-10-CM | POA: Diagnosis not present

## 2019-10-22 NOTE — Progress Notes (Signed)
  Chronic Care Management   Outreach Note  10/22/2019 Name: Casey Harrell MRN: [id number] DOB: [birth year]  Referred by: [name], MD Reason for referral : No chief complaint on file.   Third unsuccessful telephone outreach was attempted today. The patient was referred to the pharmacist for assistance with care management and care coordination.   Follow Up Plan:   Alvie Heidelberg Upstream Scheduler

## 2019-10-24 DIAGNOSIS — M6281 Muscle weakness (generalized): Secondary | ICD-10-CM | POA: Diagnosis not present

## 2019-10-24 DIAGNOSIS — R296 Repeated falls: Secondary | ICD-10-CM | POA: Diagnosis not present

## 2019-10-24 DIAGNOSIS — R488 Other symbolic dysfunctions: Secondary | ICD-10-CM | POA: Diagnosis not present

## 2019-10-24 DIAGNOSIS — R2681 Unsteadiness on feet: Secondary | ICD-10-CM | POA: Diagnosis not present

## 2019-10-26 DIAGNOSIS — E1165 Type 2 diabetes mellitus with hyperglycemia: Secondary | ICD-10-CM | POA: Diagnosis not present

## 2019-10-27 DIAGNOSIS — M6281 Muscle weakness (generalized): Secondary | ICD-10-CM | POA: Diagnosis not present

## 2019-10-27 DIAGNOSIS — R488 Other symbolic dysfunctions: Secondary | ICD-10-CM | POA: Diagnosis not present

## 2019-10-27 DIAGNOSIS — R296 Repeated falls: Secondary | ICD-10-CM | POA: Diagnosis not present

## 2019-10-27 DIAGNOSIS — R2681 Unsteadiness on feet: Secondary | ICD-10-CM | POA: Diagnosis not present

## 2019-10-28 DIAGNOSIS — R2681 Unsteadiness on feet: Secondary | ICD-10-CM | POA: Diagnosis not present

## 2019-10-28 DIAGNOSIS — R296 Repeated falls: Secondary | ICD-10-CM | POA: Diagnosis not present

## 2019-10-28 DIAGNOSIS — M6281 Muscle weakness (generalized): Secondary | ICD-10-CM | POA: Diagnosis not present

## 2019-10-29 DIAGNOSIS — R296 Repeated falls: Secondary | ICD-10-CM | POA: Diagnosis not present

## 2019-10-29 DIAGNOSIS — R488 Other symbolic dysfunctions: Secondary | ICD-10-CM | POA: Diagnosis not present

## 2019-10-30 DIAGNOSIS — R296 Repeated falls: Secondary | ICD-10-CM | POA: Diagnosis not present

## 2019-10-30 DIAGNOSIS — M6281 Muscle weakness (generalized): Secondary | ICD-10-CM | POA: Diagnosis not present

## 2019-10-30 DIAGNOSIS — R2681 Unsteadiness on feet: Secondary | ICD-10-CM | POA: Diagnosis not present

## 2019-10-31 DIAGNOSIS — R488 Other symbolic dysfunctions: Secondary | ICD-10-CM | POA: Diagnosis not present

## 2019-10-31 DIAGNOSIS — R296 Repeated falls: Secondary | ICD-10-CM | POA: Diagnosis not present

## 2019-11-03 DIAGNOSIS — R2681 Unsteadiness on feet: Secondary | ICD-10-CM | POA: Diagnosis not present

## 2019-11-03 DIAGNOSIS — M6281 Muscle weakness (generalized): Secondary | ICD-10-CM | POA: Diagnosis not present

## 2019-11-03 DIAGNOSIS — R296 Repeated falls: Secondary | ICD-10-CM | POA: Diagnosis not present

## 2019-11-04 DIAGNOSIS — R2681 Unsteadiness on feet: Secondary | ICD-10-CM | POA: Diagnosis not present

## 2019-11-04 DIAGNOSIS — R296 Repeated falls: Secondary | ICD-10-CM | POA: Diagnosis not present

## 2019-11-04 DIAGNOSIS — M6281 Muscle weakness (generalized): Secondary | ICD-10-CM | POA: Diagnosis not present

## 2019-11-05 DIAGNOSIS — R296 Repeated falls: Secondary | ICD-10-CM | POA: Diagnosis not present

## 2019-11-05 DIAGNOSIS — M6281 Muscle weakness (generalized): Secondary | ICD-10-CM | POA: Diagnosis not present

## 2019-11-05 DIAGNOSIS — R488 Other symbolic dysfunctions: Secondary | ICD-10-CM | POA: Diagnosis not present

## 2019-11-05 DIAGNOSIS — R2681 Unsteadiness on feet: Secondary | ICD-10-CM | POA: Diagnosis not present

## 2019-11-06 DIAGNOSIS — R296 Repeated falls: Secondary | ICD-10-CM | POA: Diagnosis not present

## 2019-11-06 DIAGNOSIS — R488 Other symbolic dysfunctions: Secondary | ICD-10-CM | POA: Diagnosis not present

## 2019-11-07 DIAGNOSIS — R296 Repeated falls: Secondary | ICD-10-CM | POA: Diagnosis not present

## 2019-11-07 DIAGNOSIS — R488 Other symbolic dysfunctions: Secondary | ICD-10-CM | POA: Diagnosis not present

## 2019-11-10 DIAGNOSIS — M6281 Muscle weakness (generalized): Secondary | ICD-10-CM | POA: Diagnosis not present

## 2019-11-10 DIAGNOSIS — R2681 Unsteadiness on feet: Secondary | ICD-10-CM | POA: Diagnosis not present

## 2019-11-10 DIAGNOSIS — R296 Repeated falls: Secondary | ICD-10-CM | POA: Diagnosis not present

## 2019-11-11 DIAGNOSIS — R296 Repeated falls: Secondary | ICD-10-CM | POA: Diagnosis not present

## 2019-11-11 DIAGNOSIS — M6281 Muscle weakness (generalized): Secondary | ICD-10-CM | POA: Diagnosis not present

## 2019-11-11 DIAGNOSIS — R2681 Unsteadiness on feet: Secondary | ICD-10-CM | POA: Diagnosis not present

## 2019-11-12 DIAGNOSIS — R296 Repeated falls: Secondary | ICD-10-CM | POA: Diagnosis not present

## 2019-11-12 DIAGNOSIS — M6281 Muscle weakness (generalized): Secondary | ICD-10-CM | POA: Diagnosis not present

## 2019-11-12 DIAGNOSIS — R2681 Unsteadiness on feet: Secondary | ICD-10-CM | POA: Diagnosis not present

## 2019-11-12 DIAGNOSIS — R488 Other symbolic dysfunctions: Secondary | ICD-10-CM | POA: Diagnosis not present

## 2019-11-13 DIAGNOSIS — R488 Other symbolic dysfunctions: Secondary | ICD-10-CM | POA: Diagnosis not present

## 2019-11-13 DIAGNOSIS — R296 Repeated falls: Secondary | ICD-10-CM | POA: Diagnosis not present

## 2019-11-15 ENCOUNTER — Other Ambulatory Visit: Payer: Self-pay | Admitting: Family Medicine

## 2019-11-17 DIAGNOSIS — R2681 Unsteadiness on feet: Secondary | ICD-10-CM | POA: Diagnosis not present

## 2019-11-17 DIAGNOSIS — R296 Repeated falls: Secondary | ICD-10-CM | POA: Diagnosis not present

## 2019-11-17 DIAGNOSIS — M6281 Muscle weakness (generalized): Secondary | ICD-10-CM | POA: Diagnosis not present

## 2019-11-19 DIAGNOSIS — R296 Repeated falls: Secondary | ICD-10-CM | POA: Diagnosis not present

## 2019-11-19 DIAGNOSIS — R2681 Unsteadiness on feet: Secondary | ICD-10-CM | POA: Diagnosis not present

## 2019-11-19 DIAGNOSIS — M6281 Muscle weakness (generalized): Secondary | ICD-10-CM | POA: Diagnosis not present

## 2019-11-22 DIAGNOSIS — R296 Repeated falls: Secondary | ICD-10-CM | POA: Diagnosis not present

## 2019-11-22 DIAGNOSIS — R2681 Unsteadiness on feet: Secondary | ICD-10-CM | POA: Diagnosis not present

## 2019-11-22 DIAGNOSIS — M6281 Muscle weakness (generalized): Secondary | ICD-10-CM | POA: Diagnosis not present

## 2019-11-24 DIAGNOSIS — M6281 Muscle weakness (generalized): Secondary | ICD-10-CM | POA: Diagnosis not present

## 2019-11-24 DIAGNOSIS — R296 Repeated falls: Secondary | ICD-10-CM | POA: Diagnosis not present

## 2019-11-24 DIAGNOSIS — R2681 Unsteadiness on feet: Secondary | ICD-10-CM | POA: Diagnosis not present

## 2019-11-25 DIAGNOSIS — M6281 Muscle weakness (generalized): Secondary | ICD-10-CM | POA: Diagnosis not present

## 2019-11-25 DIAGNOSIS — R296 Repeated falls: Secondary | ICD-10-CM | POA: Diagnosis not present

## 2019-11-25 DIAGNOSIS — R2681 Unsteadiness on feet: Secondary | ICD-10-CM | POA: Diagnosis not present

## 2019-11-26 DIAGNOSIS — R296 Repeated falls: Secondary | ICD-10-CM | POA: Diagnosis not present

## 2019-11-26 DIAGNOSIS — R2681 Unsteadiness on feet: Secondary | ICD-10-CM | POA: Diagnosis not present

## 2019-11-26 DIAGNOSIS — M6281 Muscle weakness (generalized): Secondary | ICD-10-CM | POA: Diagnosis not present

## 2019-11-27 ENCOUNTER — Other Ambulatory Visit: Payer: Self-pay | Admitting: Family Medicine

## 2019-11-27 DIAGNOSIS — R296 Repeated falls: Secondary | ICD-10-CM | POA: Diagnosis not present

## 2019-11-28 DIAGNOSIS — R296 Repeated falls: Secondary | ICD-10-CM | POA: Diagnosis not present

## 2019-12-01 DIAGNOSIS — R296 Repeated falls: Secondary | ICD-10-CM | POA: Diagnosis not present

## 2019-12-01 DIAGNOSIS — R2681 Unsteadiness on feet: Secondary | ICD-10-CM | POA: Diagnosis not present

## 2019-12-01 DIAGNOSIS — M6281 Muscle weakness (generalized): Secondary | ICD-10-CM | POA: Diagnosis not present

## 2019-12-03 DIAGNOSIS — R296 Repeated falls: Secondary | ICD-10-CM | POA: Diagnosis not present

## 2019-12-07 ENCOUNTER — Emergency Department (HOSPITAL_COMMUNITY): Payer: Medicare Other

## 2019-12-07 ENCOUNTER — Encounter (HOSPITAL_COMMUNITY): Payer: Self-pay

## 2019-12-07 ENCOUNTER — Emergency Department (HOSPITAL_COMMUNITY)
Admission: EM | Admit: 2019-12-07 | Discharge: 2019-12-07 | Disposition: A | Discharge status: Home / Self Care | Payer: Medicare Other | Attending: Emergency Medicine | Admitting: Emergency Medicine

## 2019-12-07 ENCOUNTER — Other Ambulatory Visit: Payer: Self-pay

## 2019-12-07 DIAGNOSIS — R6889 Other general symptoms and signs: Secondary | ICD-10-CM | POA: Diagnosis not present

## 2019-12-07 DIAGNOSIS — W19XXXA Unspecified fall, initial encounter: Secondary | ICD-10-CM | POA: Diagnosis not present

## 2019-12-07 DIAGNOSIS — W1789XA Other fall from one level to another, initial encounter: Secondary | ICD-10-CM | POA: Diagnosis not present

## 2019-12-07 DIAGNOSIS — S199XXA Unspecified injury of neck, initial encounter: Secondary | ICD-10-CM | POA: Diagnosis not present

## 2019-12-07 DIAGNOSIS — Y999 Unspecified external cause status: Secondary | ICD-10-CM | POA: Diagnosis not present

## 2019-12-07 DIAGNOSIS — E1165 Type 2 diabetes mellitus with hyperglycemia: Secondary | ICD-10-CM | POA: Diagnosis not present

## 2019-12-07 DIAGNOSIS — Y929 Unspecified place or not applicable: Secondary | ICD-10-CM | POA: Insufficient documentation

## 2019-12-07 DIAGNOSIS — Z743 Need for continuous supervision: Secondary | ICD-10-CM | POA: Diagnosis not present

## 2019-12-07 DIAGNOSIS — R0902 Hypoxemia: Secondary | ICD-10-CM | POA: Diagnosis not present

## 2019-12-07 DIAGNOSIS — Y939 Activity, unspecified: Secondary | ICD-10-CM | POA: Insufficient documentation

## 2019-12-07 DIAGNOSIS — S0990XA Unspecified injury of head, initial encounter: Secondary | ICD-10-CM | POA: Diagnosis not present

## 2019-12-07 DIAGNOSIS — G4489 Other headache syndrome: Secondary | ICD-10-CM | POA: Diagnosis not present

## 2019-12-07 DIAGNOSIS — S0003XA Contusion of scalp, initial encounter: Secondary | ICD-10-CM | POA: Diagnosis not present

## 2019-12-07 DIAGNOSIS — Z7401 Bed confinement status: Secondary | ICD-10-CM | POA: Diagnosis not present

## 2019-12-07 DIAGNOSIS — I10 Essential (primary) hypertension: Secondary | ICD-10-CM | POA: Diagnosis not present

## 2019-12-07 DIAGNOSIS — M2550 Pain in unspecified joint: Secondary | ICD-10-CM | POA: Diagnosis not present

## 2019-12-07 NOTE — Progress Notes (Signed)
   12/07/19 2017  Clinical Encounter Type  Visited With Health care provider;Patient not available  Visit Type Initial;ED;Trauma   Chaplain responded to a trauma in the ED. Patient is currently undergoing scans. No family present at this time. Per RN, no needs at this time. Spiritual care services available as needed.   Alda Ponder, Chaplain

## 2019-12-07 NOTE — ED Triage Notes (Signed)
Pt BIB [facility] for eval of mechanical fall. Pt was moving chair outside, tripped & fell. Pt is level 2 due to being on eliquis for hx of CVA. VSS w EMS, no IV obtained en route. Pt complaining of 4/10 pain to head, no additional complaints at this time.

## 2019-12-07 NOTE — ED Provider Notes (Addendum)
[facility] EMERGENCY DEPARTMENT Provider Note   CSN: [id number] Arrival date & time: 12/07/19  1952     History Chief Complaint  Patient presents with  . fall on thinners    Casey Harrell is a 85 y.o. female.  Patient states that she lost balance at the back of her head.  No loss of consciousness.  Is on blood thinners.  Sent from nursing home.  The history is provided by the patient.  Fall This is a new problem. The current episode started less than 1 hour ago. Pertinent negatives include no chest pain, no abdominal pain, no headaches and no shortness of breath. Nothing aggravates the symptoms. Nothing relieves the symptoms. She has tried nothing for the symptoms. The treatment provided no relief.       History reviewed. No pertinent past medical history.  There are no problems to display for this patient.   History reviewed. No pertinent surgical history.   OB History   No obstetric history on file.     History reviewed. No pertinent family history.  Social History   Tobacco Use  . Smoking status: Not on file  Substance Use Topics  . Alcohol use: Not on file  . Drug use: Not on file    Home Medications Prior to Admission medications   Not on File    Allergies    Patient has no allergy information on record.  Review of Systems   Review of Systems  Constitutional: Negative for chills and fever.  HENT: Negative for ear pain and sore throat.   Eyes: Negative for pain and visual disturbance.  Respiratory: Negative for cough and shortness of breath.   Cardiovascular: Negative for chest pain and palpitations.  Gastrointestinal: Negative for abdominal pain and vomiting.  Genitourinary: Negative for dysuria and hematuria.  Musculoskeletal: Negative for arthralgias and back pain.  Skin: Negative for color change and rash.  Neurological: Negative for seizures, syncope and headaches.  All other systems reviewed and are negative.   Physical  Exam Updated Vital Signs  ED Triage Vitals  Enc Vitals Group     BP 12/07/19 2005 (!) 154/71     Pulse Rate 12/07/19 2005 98     Resp 12/07/19 2005 16     Temp 12/07/19 2005 98.4 F (36.9 C)     Temp Source 12/07/19 2005 Oral     SpO2 12/07/19 2005 97 %     Weight 12/07/19 2005 140 lb (63.5 kg)     Height 12/07/19 2005 5\' 1"  (1.549 m)     Head Circumference --      Peak Flow --      Pain Score 12/07/19 2004 2     Pain Loc --      Pain Edu? --      Excl. in [facility]? --     Physical Exam Vitals and nursing note reviewed.  Constitutional:      General: She is not in acute distress.    Appearance: She is well-developed.  HENT:     Head:     Comments: Hematoma to posterior scalp    Nose: Nose normal.     Mouth/Throat:     Mouth: Mucous membranes are moist.  Eyes:     Extraocular Movements: Extraocular movements intact.     Conjunctiva/sclera: Conjunctivae normal.     Pupils: Pupils are equal, round, and reactive to light.  Cardiovascular:     Rate and Rhythm: Normal rate and regular rhythm.  Heart sounds: No murmur heard.   Pulmonary:     Effort: Pulmonary effort is normal. No respiratory distress.     Breath sounds: Normal breath sounds.  Abdominal:     Palpations: Abdomen is soft.     Tenderness: There is no abdominal tenderness.  Musculoskeletal:        General: No tenderness.     Cervical back: Neck supple. No tenderness.     Comments: No midline spinal tenderness  Skin:    General: Skin is warm and dry.     Capillary Refill: Capillary refill takes less than 2 seconds.  Neurological:     General: No focal deficit present.     Mental Status: She is alert.     Cranial Nerves: No cranial nerve deficit.     Sensory: No sensory deficit.     Motor: No weakness.     ED Results / Procedures / Treatments   Labs (all labs ordered are listed, but only abnormal results are displayed) Labs Reviewed - No data to display  EKG None  Radiology CT Head Wo  Contrast  Result Date: 12/07/2019 CLINICAL DATA:  Neck trauma, post mechanical fall EXAM: CT HEAD WITHOUT CONTRAST CT CERVICAL SPINE WITHOUT CONTRAST TECHNIQUE: Multidetector CT imaging of the head and cervical spine was performed following the standard protocol without intravenous contrast. Multiplanar CT image reconstructions of the cervical spine were also generated. COMPARISON:  May of 2021 and multiple prior studies. FINDINGS: CT HEAD FINDINGS Brain: No evidence of acute infarction, hemorrhage, hydrocephalus, extra-axial collection or mass lesion/mass effect. Signs of atrophy and chronic microvascular ischemic change as before. Signs of prior bilateral occipital infarcts with similar appearance. Vascular: No hyperdense vessel or unexpected calcification. Skull: Normal. Negative for fracture or focal lesion. Sinuses/Orbits: Mild sphenoid and maxillary sinus disease. Orbits are unremarkable. Other: None. CT CERVICAL SPINE FINDINGS Alignment: Normal. Skull base and vertebrae: No acute fracture. No primary bone lesion or focal pathologic process. Soft tissues and spinal canal: No prevertebral fluid or swelling. No visible canal hematoma. Postoperative changes along the RIGHT neck. Correlate with surgical history. Disc levels: Multilevel degenerative changes in the cervical spine. Facet arthropathy greatest at C3-4 on the RIGHT and C4-5 on the LEFT with disc space narrowing and uncovertebral degenerative changes greatest at C5-C6. Upper chest: Negative. Other: None IMPRESSION: 1. No acute intracranial abnormality. 2. Signs of atrophy, chronic microvascular ischemic change in prior infarct as before. 3. No evidence for acute fracture or malalignment of the cervical spine. 4. Multilevel degenerative changes in the cervical spine. Electronically Signed   By: Donzetta Kohut M.D.   On: 12/07/2019 20:43   CT Cervical Spine Wo Contrast  Result Date: 12/07/2019 CLINICAL DATA:  Neck trauma, post mechanical fall EXAM: CT  HEAD WITHOUT CONTRAST CT CERVICAL SPINE WITHOUT CONTRAST TECHNIQUE: Multidetector CT imaging of the head and cervical spine was performed following the standard protocol without intravenous contrast. Multiplanar CT image reconstructions of the cervical spine were also generated. COMPARISON:  May of 2021 and multiple prior studies. FINDINGS: CT HEAD FINDINGS Brain: No evidence of acute infarction, hemorrhage, hydrocephalus, extra-axial collection or mass lesion/mass effect. Signs of atrophy and chronic microvascular ischemic change as before. Signs of prior bilateral occipital infarcts with similar appearance. Vascular: No hyperdense vessel or unexpected calcification. Skull: Normal. Negative for fracture or focal lesion. Sinuses/Orbits: Mild sphenoid and maxillary sinus disease. Orbits are unremarkable. Other: None. CT CERVICAL SPINE FINDINGS Alignment: Normal. Skull base and vertebrae: No acute fracture. No primary bone  lesion or focal pathologic process. Soft tissues and spinal canal: No prevertebral fluid or swelling. No visible canal hematoma. Postoperative changes along the RIGHT neck. Correlate with surgical history. Disc levels: Multilevel degenerative changes in the cervical spine. Facet arthropathy greatest at C3-4 on the RIGHT and C4-5 on the LEFT with disc space narrowing and uncovertebral degenerative changes greatest at C5-C6. Upper chest: Negative. Other: None IMPRESSION: 1. No acute intracranial abnormality. 2. Signs of atrophy, chronic microvascular ischemic change in prior infarct as before. 3. No evidence for acute fracture or malalignment of the cervical spine. 4. Multilevel degenerative changes in the cervical spine. Electronically Signed   By: Donzetta Kohut M.D.   On: 12/07/2019 20:43    Procedures Procedures (including critical care time)  Medications Ordered in ED Medications - No data to display  ED Course  I have reviewed the triage vital signs and the nursing notes.  Pertinent  labs & imaging results that were available during my care of the patient were reviewed by me and considered in my medical decision making (see chart for details).    MDM Rules/Calculators/A&P                          CLEONA DOUBLEDAY is an 85 year old female history of dementia on blood thinners who presents to the ED after fall.  Normal vitals.  No fever.  Neurologically no significant findings.  Exam is overall unremarkable.  She does not have any pain in her head or neck.  Appears to have maybe a small hematoma in the posterior scalp.  Head CT and neck CT were normal.  No midline spinal tenderness.  Clear breath sounds.  Patient otherwise with no extremity pain and overall well-appearing.  Discharged from the ED in good condition.  Given return precautions.  This chart was dictated using voice recognition software.  Despite best efforts to proofread,  errors can occur which can change the documentation meaning.    Final Clinical Impression(s) / ED Diagnoses Final diagnoses:  Fall, initial encounter    Rx / DC Orders ED Discharge Orders    None       Virgina Norfolk, DO 12/07/19 2049    Virgina Norfolk, DO 12/07/19 2050

## 2019-12-07 NOTE — ED Notes (Signed)
Patient transported to CT 

## 2019-12-07 NOTE — Discharge Instructions (Addendum)
No injuries to her head or her neck.

## 2019-12-08 ENCOUNTER — Encounter (HOSPITAL_COMMUNITY): Payer: Self-pay | Admitting: Emergency Medicine

## 2020-01-05 ENCOUNTER — Other Ambulatory Visit: Payer: Self-pay

## 2020-01-05 ENCOUNTER — Emergency Department (HOSPITAL_COMMUNITY): Payer: Medicare Other

## 2020-01-05 ENCOUNTER — Emergency Department (HOSPITAL_COMMUNITY)
Admission: EM | Admit: 2020-01-05 | Discharge: 2020-01-05 | Disposition: A | Discharge status: Skilled Nursing Facility | Payer: Medicare Other | Attending: Emergency Medicine | Admitting: Emergency Medicine

## 2020-01-05 ENCOUNTER — Encounter (HOSPITAL_COMMUNITY): Payer: Self-pay

## 2020-01-05 DIAGNOSIS — G319 Degenerative disease of nervous system, unspecified: Secondary | ICD-10-CM | POA: Diagnosis not present

## 2020-01-05 DIAGNOSIS — M25551 Pain in right hip: Secondary | ICD-10-CM | POA: Diagnosis not present

## 2020-01-05 DIAGNOSIS — N183 Chronic kidney disease, stage 3 unspecified: Secondary | ICD-10-CM | POA: Insufficient documentation

## 2020-01-05 DIAGNOSIS — J441 Chronic obstructive pulmonary disease with (acute) exacerbation: Secondary | ICD-10-CM | POA: Diagnosis not present

## 2020-01-05 DIAGNOSIS — Z7982 Long term (current) use of aspirin: Secondary | ICD-10-CM | POA: Diagnosis not present

## 2020-01-05 DIAGNOSIS — I6381 Other cerebral infarction due to occlusion or stenosis of small artery: Secondary | ICD-10-CM | POA: Diagnosis not present

## 2020-01-05 DIAGNOSIS — Z79890 Hormone replacement therapy (postmenopausal): Secondary | ICD-10-CM | POA: Diagnosis not present

## 2020-01-05 DIAGNOSIS — Z79899 Other long term (current) drug therapy: Secondary | ICD-10-CM | POA: Diagnosis not present

## 2020-01-05 DIAGNOSIS — Z043 Encounter for examination and observation following other accident: Secondary | ICD-10-CM | POA: Diagnosis not present

## 2020-01-05 DIAGNOSIS — R52 Pain, unspecified: Secondary | ICD-10-CM | POA: Diagnosis not present

## 2020-01-05 DIAGNOSIS — R5381 Other malaise: Secondary | ICD-10-CM | POA: Diagnosis not present

## 2020-01-05 DIAGNOSIS — I129 Hypertensive chronic kidney disease with stage 1 through stage 4 chronic kidney disease, or unspecified chronic kidney disease: Secondary | ICD-10-CM | POA: Insufficient documentation

## 2020-01-05 DIAGNOSIS — J45909 Unspecified asthma, uncomplicated: Secondary | ICD-10-CM | POA: Diagnosis not present

## 2020-01-05 DIAGNOSIS — M47812 Spondylosis without myelopathy or radiculopathy, cervical region: Secondary | ICD-10-CM | POA: Diagnosis not present

## 2020-01-05 DIAGNOSIS — R102 Pelvic and perineal pain: Secondary | ICD-10-CM | POA: Insufficient documentation

## 2020-01-05 DIAGNOSIS — E039 Hypothyroidism, unspecified: Secondary | ICD-10-CM | POA: Diagnosis not present

## 2020-01-05 DIAGNOSIS — E1165 Type 2 diabetes mellitus with hyperglycemia: Secondary | ICD-10-CM | POA: Diagnosis not present

## 2020-01-05 DIAGNOSIS — W19XXXA Unspecified fall, initial encounter: Secondary | ICD-10-CM | POA: Insufficient documentation

## 2020-01-05 DIAGNOSIS — R404 Transient alteration of awareness: Secondary | ICD-10-CM | POA: Diagnosis not present

## 2020-01-05 DIAGNOSIS — Z743 Need for continuous supervision: Secondary | ICD-10-CM | POA: Diagnosis not present

## 2020-01-05 DIAGNOSIS — R9082 White matter disease, unspecified: Secondary | ICD-10-CM | POA: Diagnosis not present

## 2020-01-05 DIAGNOSIS — R279 Unspecified lack of coordination: Secondary | ICD-10-CM | POA: Diagnosis not present

## 2020-01-05 DIAGNOSIS — E119 Type 2 diabetes mellitus without complications: Secondary | ICD-10-CM | POA: Insufficient documentation

## 2020-01-05 DIAGNOSIS — I10 Essential (primary) hypertension: Secondary | ICD-10-CM | POA: Diagnosis not present

## 2020-01-05 DIAGNOSIS — M62838 Other muscle spasm: Secondary | ICD-10-CM | POA: Diagnosis not present

## 2020-01-05 LAB — CBC WITH DIFFERENTIAL/PLATELET
Abs Immature Granulocytes: 0.06 10*3/uL (ref 0.00–0.07)
Basophils Absolute: 0.1 10*3/uL (ref 0.0–0.1)
Basophils Relative: 1 %
Eosinophils Absolute: 0.4 10*3/uL (ref 0.0–0.5)
Eosinophils Relative: 4 %
HCT: 39.7 % (ref 36.0–46.0)
Hemoglobin: 12.5 g/dL (ref 12.0–15.0)
Immature Granulocytes: 1 %
Lymphocytes Relative: 24 %
Lymphs Abs: 2.5 10*3/uL (ref 0.7–4.0)
MCH: 26 pg (ref 26.0–34.0)
MCHC: 31.5 g/dL (ref 30.0–36.0)
MCV: 82.7 fL (ref 80.0–100.0)
Monocytes Absolute: 0.8 10*3/uL (ref 0.1–1.0)
Monocytes Relative: 8 %
Neutro Abs: 6.6 10*3/uL (ref 1.7–7.7)
Neutrophils Relative %: 62 %
Platelets: 317 10*3/uL (ref 150–400)
RBC: 4.8 MIL/uL (ref 3.87–5.11)
RDW: 16.1 % — ABNORMAL HIGH (ref 11.5–15.5)
WBC: 10.4 10*3/uL (ref 4.0–10.5)
nRBC: 0 % (ref 0.0–0.2)

## 2020-01-05 LAB — BASIC METABOLIC PANEL
Anion gap: 11 (ref 5–15)
BUN: 20 mg/dL (ref 8–23)
CO2: 25 mmol/L (ref 22–32)
Calcium: 9.2 mg/dL (ref 8.9–10.3)
Chloride: 99 mmol/L (ref 98–111)
Creatinine, Ser: 1.03 mg/dL — ABNORMAL HIGH (ref 0.44–1.00)
GFR calc Af Amer: 59 mL/min — ABNORMAL LOW (ref >60)
GFR calc non Af Amer: 51 mL/min — ABNORMAL LOW (ref >60)
Glucose, Bld: 404 mg/dL — ABNORMAL HIGH (ref 70–99)
Potassium: 3.6 mmol/L (ref 3.5–5.1)
Sodium: 135 mmol/L (ref 135–145)

## 2020-01-05 LAB — URINALYSIS, ROUTINE W REFLEX MICROSCOPIC
Bilirubin Urine: NEGATIVE
Glucose, UA: >=500 mg/dL — AB
Hgb urine dipstick: NEGATIVE
Ketones, ur: NEGATIVE mg/dL
Leukocytes,Ua: NEGATIVE
Nitrite: NEGATIVE
Protein, ur: NEGATIVE mg/dL
Specific Gravity, Urine: 1.013 (ref 1.005–1.030)
pH: 6 (ref 5.0–8.0)

## 2020-01-05 NOTE — ED Triage Notes (Signed)
Patient arrived with [facility] after a unwitnessed mechanical fall. Complaints of right hip pain. Per EMS, alert and oriented x3 per baseline

## 2020-01-05 NOTE — ED Provider Notes (Signed)
[facility]-EMERGENCY DEPT Provider Note   CSN: [id number] Arrival date & time: 01/05/20  7425     History Chief Complaint  Patient presents with  . Fall    Casey Harrell is a 85 y.o. female.   Fall This is a recurrent problem. The current episode started 1 to 2 hours ago. The problem occurs constantly. The problem has not changed since onset.Pertinent negatives include no chest pain, no abdominal pain, no headaches and no shortness of breath. Nothing aggravates the symptoms. Nothing relieves the symptoms. She has tried nothing for the symptoms. The treatment provided no relief.       Past Medical History:  Diagnosis Date  . ASTHMA 07/15/2008  . COPD (chronic obstructive pulmonary disease) (HCC)   . DIABETES-TYPE 2 07/15/2008  . DIABETIC PERIPHERAL NEUROPATHY 09/02/2009  . GERD 07/15/2008  . HYPERTENSION 07/15/2008  . Normal cardiac stress test    low risk Nuc 2009, Feb 2011  . Paroxysmal atrial fibrillation (HCC)   . PVD (peripheral vascular disease) (HCC) 06/18/09   RCE  . Stroke (HCC)   . Vertigo     Patient Active Problem List   Diagnosis Date Noted  . Ischemic stroke (HCC) 04/21/2019  . Labile blood pressure   . Labile blood glucose   . AKI (acute kidney injury) (HCC)   . Diabetes mellitus type 2 in nonobese (HCC)   . PAF (paroxysmal atrial fibrillation) (HCC)   . Reactive hypertension   . Right pontine CVA (HCC) 10/08/2018  . COPD (chronic obstructive pulmonary disease) (HCC) 10/03/2018  . 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 Left pontine cerebrovascular accident (HCC) 04/03/2017  . History of CVA with residual deficit   . DM (diabetes mellitus) (HCC)   . Anemia 03/30/2017  . History of stroke   . Chest pain 02/09/2016  . BPPV (benign paroxysmal positional vertigo) 08/25/2015  . HLD (hyperlipidemia) 08/25/2015  . At high risk for falls 07/01/2015  . Vertigo 06/21/2015  . Paroxysmal atrial fibrillation (HCC) 06/11/2015   . Cerebral infarction due to stenosis of left middle cerebral artery (HCC) 05/20/2015  . Intracranial vascular stenosis 05/20/2015  . Type 2 diabetes mellitus with circulatory disorder (HCC) 05/20/2015  . Type 2 diabetes, uncontrolled, with neuropathy (HCC)   . Abdominal pain   . Partial small bowel obstruction (HCC)   . Benign essential HTN   . Small bowel obstruction (HCC) 05/07/2015  . Hemiplegia, unspecified affecting right dominant side (HCC) 04/08/2015  . Embolic stroke involving posterior cerebral artery (HCC) 04/05/2015  . Orthostatic hypotension   . Hypertensive urgency 04/01/2015  . Fever in adult 04/01/2015  . Acute encephalopathy 04/01/2015  . Confusion   . CVA (cerebral infarction)   . Posterior circulation stroke (HCC)   . Acute ischemic stroke (HCC) 03/31/2015  . Stroke (HCC) 03/31/2015  . CKD (chronic kidney disease) stage 3, GFR 30-59 ml/min 04/17/2014  . Gastroenteritis, non-infectious 04/11/2014  . Nausea vomiting and diarrhea 04/10/2014  . Hypokalemia 04/10/2014  . Dehydration 04/09/2014  . Bronchitis with airway obstruction (HCC) 02/03/2013  . Fever 02/03/2013  . Acute respiratory failure with hypoxia (HCC) 02/03/2013  . Depression 02/03/2013  . Obesity (BMI 30-39.9) 01/10/2013  . PVD (peripheral vascular disease)- S/P RCE 06/18/09 12/13/2012  . Sleep apnea- C-pap intol 12/13/2012  . Low risk cardiac nuclear stress test- 2009 and Feb 2011 12/13/2012  . Mild persistent asthma 11/29/2011  . Chronic cough 06/05/2011  . Diabetic neuropathy (HCC) 09/02/2009  . Dyslipidemia 07/13/2009  .  Hypothyroidism 11/12/2008  . Type 2 diabetes mellitus, uncontrolled (HCC) 07/15/2008  . Essential hypertension 07/15/2008  . GERD 07/15/2008  . DIVERTICULOSIS, COLON 07/15/2008    Past Surgical History:  Procedure Laterality Date  . ABDOMINAL HYSTERECTOMY  1971  . APPENDECTOMY  1971  . CAROTID ENDARTERECTOMY  06/18/09   RCE  . CESAREAN SECTION    . colectomy and  colostomy    . FLEXIBLE SIGMOIDOSCOPY     diverticulitis  . FOOT SURGERY     right foot X 2  . LAPAROTOMY     X 3 with adhesions lysis  . TONSILLECTOMY    . TUBAL LIGATION       OB History   No obstetric history on file.     Family History  Problem Relation Age of Onset  . Diabetes Mother   . Arthritis Mother   . Heart disease Mother   . Heart disease Father   . Heart disease Sister   . Heart disease Brother   . Cancer Brother 60       lung cancer  . Celiac disease Sister   . Stroke Maternal Grandfather   . Cancer Paternal Grandfather        lung  . Heart disease Sister   . Asthma Sister     Social History   Tobacco Use  . Smoking status: Never Smoker  . Smokeless tobacco: Never Used  Vaping Use  . Vaping Use: Never assessed  Substance Use Topics  . Alcohol use: No  . Drug use: No    Home Medications Prior to Admission medications   Medication Sig Start Date End Date Taking? Authorizing Provider  amLODipine (NORVASC) 5 MG tablet TAKE 1 TABLET BY MOUTH EVERY DAY 11/15/19   Burchette, Elberta Fortis, MD  apixaban (ELIQUIS) 5 MG TABS tablet Take 5 mg by mouth 2 (two) times daily.    [provider]  aspirin (ASPIRIN LOW DOSE) 81 MG EC tablet Take 1 tablet (81 mg total) by mouth daily. Appointment needed for further refills. 08/25/19   Ihor Austin, NP  atorvastatin (LIPITOR) 20 MG tablet TAKE 1 TABLET BY MOUTH EVERY DAY 11/28/19   Burchette, Elberta Fortis, MD  Biotin 5000 MCG TABS Take 5,000 mcg by mouth every evening.     [provider]  Calcium Carb-Cholecalciferol (CALCIUM 600-D PO) Take 1 tablet by mouth every evening.    [provider]  Cholecalciferol (VITAMIN D3) 2000 units TABS Take 2,000 Units by mouth daily. Reported on 04/26/2015 Patient taking differently: Take 2,000 Units by mouth every evening. Reported on 04/26/2015 04/13/17   Angiulli, Mcarthur Rossetti, PA-C  DULoxetine (CYMBALTA) 30 MG capsule Take 30 mg by mouth 2 (two) times daily.     [provider]  LEVEMIR FLEXTOUCH 100 UNIT/ML FlexPen Inject 28 Units into the skin in the morning and at bedtime. 08/26/19   [provider]  levothyroxine (SYNTHROID) 50 MCG tablet TAKE 1 TABLET BY MOUTH EVERY DAY BEFORE BREAKFAST 10/16/19   Burchette, Elberta Fortis, MD  pantoprazole (PROTONIX) 40 MG tablet Take 40 mg by mouth 2 (two) times daily.    [provider]  sertraline (ZOLOFT) 25 MG tablet Take 1 tablet (25 mg total) by mouth daily. 07/24/19   Ihor Austin, NP    Allergies    Doxycycline hyclate, Morphine sulfate, Iohexol, and Moxifloxacin  Review of Systems   Review of Systems  Respiratory: Negative for shortness of breath.   Cardiovascular: Negative for chest pain.  Gastrointestinal: Negative  for abdominal pain.  Neurological: Negative for headaches.  All other systems reviewed and are negative.   Physical Exam Updated Vital Signs BP (!) 148/84   Pulse 93   Temp 97.7 F (36.5 C) (Oral)   Resp 18   Ht 5\' 2"  (1.575 m)   Wt 68 kg   SpO2 93%   BMI 27.44 kg/m   Physical Exam Vitals and nursing note reviewed.  Constitutional:      Appearance: She is well-developed.  HENT:     Head: Normocephalic and atraumatic.     Nose: No congestion or rhinorrhea.     Mouth/Throat:     Mouth: Mucous membranes are moist.     Pharynx: Oropharynx is clear.  Cardiovascular:     Rate and Rhythm: Normal rate and regular rhythm.  Pulmonary:     Effort: No respiratory distress.     Breath sounds: No stridor.  Abdominal:     General: Abdomen is flat. There is no distension.  Musculoskeletal:        General: Tenderness (minimal to right pelvis) present. No swelling. Normal range of motion.     Cervical back: Normal range of motion.  Skin:    General: Skin is warm.     Coloration: Skin is not jaundiced or pale.  Neurological:     General: No focal deficit present.     Mental Status: She is alert.     ED Results / Procedures / Treatments   Labs (all  labs ordered are listed, but only abnormal results are displayed) Labs Reviewed  CBC WITH DIFFERENTIAL/PLATELET - Abnormal; Notable for the following components:      Result Value   RDW 16.1 (*)    All other components within normal limits  BASIC METABOLIC PANEL - Abnormal; Notable for the following components:   Glucose, Bld 404 (*)    Creatinine, Ser 1.03 (*)    GFR calc non Af Amer 51 (*)    GFR calc Af Amer 59 (*)    All other components within normal limits  URINALYSIS, ROUTINE W REFLEX MICROSCOPIC - Abnormal; Notable for the following components:   Glucose, UA >=500 (*)    Bacteria, UA FEW (*)    All other components within normal limits    EKG EKG Interpretation  Date/Time:  Monday January 05 2020 06:01:36 EDT Ventricular Rate:  93 PR Interval:    QRS Duration: 74 QT Interval:  372 QTC Calculation: 463 R Axis:   -12 Text Interpretation: Sinus rhythm Low voltage, precordial leads Anteroseptal infarct, old Confirmed by Marily MemosMesner, Reshawn Ostlund [phone]([id number]) on 01/05/2020 6:25:56 AM Also confirmed by Marily MemosMesner, Emmauel Hallums [phone]), editor Elita QuickWatlington, Beverly ([id number])  on 01/05/2020 11:01:44 AM   Radiology CT Head Wo Contrast  Result Date: 01/05/2020 CLINICAL DATA:  Unwitnessed fall. EXAM: CT HEAD WITHOUT CONTRAST CT CERVICAL SPINE WITHOUT CONTRAST TECHNIQUE: Multidetector CT imaging of the head and cervical spine was performed following the standard protocol without intravenous contrast. Multiplanar CT image reconstructions of the cervical spine were also generated. COMPARISON:  12/07/2019 FINDINGS: CT HEAD FINDINGS Brain: There is no evidence for acute hemorrhage, hydrocephalus, mass lesion, or abnormal extra-axial fluid collection. No definite CT evidence for acute infarction. Diffuse loss of parenchymal volume is consistent with atrophy. Patchy low attenuation in the deep hemispheric and periventricular white matter is nonspecific, but likely reflects chronic microvascular ischemic demyelination. Lacunar  infarct in the right basal ganglia is unchanged. Vascular: No hyperdense vessel or unexpected calcification. Skull: No evidence for fracture. No  worrisome lytic or sclerotic lesion. Sinuses/Orbits: Visualized portions of the globes and intraorbital fat are unremarkable. The visualized paranasal sinuses and mastoid air cells are clear. Other: None. CT CERVICAL SPINE FINDINGS Alignment: Straightening of normal cervical lordosis. Trace anterolisthesis of C4 on 5 is compatible with the facet osteoarthritis at the same level. Skull base and vertebrae: No acute fracture. No primary bone lesion or focal pathologic process. Soft tissues and spinal canal: No prevertebral fluid or swelling. No visible canal hematoma. Disc levels:  Loss of disc height noted C5-6 and C6-7. Upper chest: Unremarkable. Other: None. IMPRESSION: 1. No acute intracranial abnormality. Atrophy with chronic small vessel white matter ischemic disease. 2. Degenerative changes in the cervical spine without fracture. 3. Loss of normal cervical lordosis. This can be related to patient positioning, muscle spasm or soft tissue injury. Electronically Signed   By: Kennith Center M.D.   On: 01/05/2020 06:35   CT Cervical Spine Wo Contrast  Result Date: 01/05/2020 CLINICAL DATA:  Unwitnessed fall. EXAM: CT HEAD WITHOUT CONTRAST CT CERVICAL SPINE WITHOUT CONTRAST TECHNIQUE: Multidetector CT imaging of the head and cervical spine was performed following the standard protocol without intravenous contrast. Multiplanar CT image reconstructions of the cervical spine were also generated. COMPARISON:  12/07/2019 FINDINGS: CT HEAD FINDINGS Brain: There is no evidence for acute hemorrhage, hydrocephalus, mass lesion, or abnormal extra-axial fluid collection. No definite CT evidence for acute infarction. Diffuse loss of parenchymal volume is consistent with atrophy. Patchy low attenuation in the deep hemispheric and periventricular white matter is nonspecific, but likely  reflects chronic microvascular ischemic demyelination. Lacunar infarct in the right basal ganglia is unchanged. Vascular: No hyperdense vessel or unexpected calcification. Skull: No evidence for fracture. No worrisome lytic or sclerotic lesion. Sinuses/Orbits: Visualized portions of the globes and intraorbital fat are unremarkable. The visualized paranasal sinuses and mastoid air cells are clear. Other: None. CT CERVICAL SPINE FINDINGS Alignment: Straightening of normal cervical lordosis. Trace anterolisthesis of C4 on 5 is compatible with the facet osteoarthritis at the same level. Skull base and vertebrae: No acute fracture. No primary bone lesion or focal pathologic process. Soft tissues and spinal canal: No prevertebral fluid or swelling. No visible canal hematoma. Disc levels:  Loss of disc height noted C5-6 and C6-7. Upper chest: Unremarkable. Other: None. IMPRESSION: 1. No acute intracranial abnormality. Atrophy with chronic small vessel white matter ischemic disease. 2. Degenerative changes in the cervical spine without fracture. 3. Loss of normal cervical lordosis. This can be related to patient positioning, muscle spasm or soft tissue injury. Electronically Signed   By: Kennith Center M.D.   On: 01/05/2020 06:35   DG Hip Unilat  With Pelvis 2-3 Views Right  Result Date: 01/05/2020 CLINICAL DATA:  Unwitnessed mechanical fall with right hip pain EXAM: DG HIP (WITH OR WITHOUT PELVIS) 2-3V RIGHT COMPARISON:  09/10/2019 FINDINGS: There is no evidence of hip fracture or dislocation. No noted hip spurring. Generalized osteopenia. IMPRESSION: Negative. Electronically Signed   By: Marnee Spring M.D.   On: 01/05/2020 06:19    Procedures Procedures (including critical care time)  Medications Ordered in ED Medications - No data to display  ED Course  I have reviewed the triage vital signs and the nursing notes.  Pertinent labs & imaging results that were available during my care of the patient were  reviewed by me and considered in my medical decision making (see chart for details).    MDM Rules/Calculators/A&P  eval for bony injury. On eliquis, will scan head/neck.  Ct's ok no e/o bleed/fracture. xr okay.   Rest of workup reassurring. Stable for dc to facility.   Final Clinical Impression(s) / ED Diagnoses Final diagnoses:  Fall, initial encounter    Rx / DC Orders ED Discharge Orders    None       Ashleynicole Mcclees, Barbara Cower, MD 01/05/20 2320

## 2020-01-05 NOTE — ED Notes (Signed)
[facility] called for transportation  

## 2020-01-26 DIAGNOSIS — E1165 Type 2 diabetes mellitus with hyperglycemia: Secondary | ICD-10-CM | POA: Diagnosis not present

## 2020-02-09 ENCOUNTER — Other Ambulatory Visit: Payer: Self-pay | Admitting: Family Medicine

## 2020-02-09 DIAGNOSIS — E1165 Type 2 diabetes mellitus with hyperglycemia: Secondary | ICD-10-CM | POA: Diagnosis not present

## 2020-02-09 DIAGNOSIS — R81 Glycosuria: Secondary | ICD-10-CM | POA: Diagnosis not present

## 2020-02-09 DIAGNOSIS — Z23 Encounter for immunization: Secondary | ICD-10-CM | POA: Diagnosis not present

## 2020-02-09 DIAGNOSIS — R300 Dysuria: Secondary | ICD-10-CM | POA: Diagnosis not present

## 2020-02-09 DIAGNOSIS — K047 Periapical abscess without sinus: Secondary | ICD-10-CM | POA: Diagnosis not present

## 2020-02-09 DIAGNOSIS — I10 Essential (primary) hypertension: Secondary | ICD-10-CM | POA: Diagnosis not present

## 2020-02-09 DIAGNOSIS — Z794 Long term (current) use of insulin: Secondary | ICD-10-CM | POA: Diagnosis not present

## 2020-03-20 ENCOUNTER — Other Ambulatory Visit: Payer: Self-pay | Admitting: Family Medicine

## 2020-04-20 ENCOUNTER — Telehealth: Payer: Self-pay | Admitting: Family Medicine

## 2020-04-20 NOTE — Telephone Encounter (Signed)
Casey Harrell is calling and stated that she faxed over orders for OT and speech for patient but hasn't heard anything back, please advise. CB is [phone]

## 2020-04-21 NOTE — Telephone Encounter (Signed)
Left a detailed message on the voicemail below stating a fax has not been received as of today.  Requested the order be faxed to [phone] and I will ask Dr Caryl Never to review.

## 2020-04-26 DIAGNOSIS — E1165 Type 2 diabetes mellitus with hyperglycemia: Secondary | ICD-10-CM | POA: Diagnosis not present

## 2020-04-28 ENCOUNTER — Emergency Department (HOSPITAL_COMMUNITY): Payer: Medicare Other

## 2020-04-28 ENCOUNTER — Emergency Department (HOSPITAL_COMMUNITY)
Admission: EM | Admit: 2020-04-28 | Discharge: 2020-04-28 | Disposition: A | Discharge status: Home / Self Care | Payer: Medicare Other | Attending: Emergency Medicine | Admitting: Emergency Medicine

## 2020-04-28 ENCOUNTER — Other Ambulatory Visit: Payer: Self-pay

## 2020-04-28 ENCOUNTER — Other Ambulatory Visit: Payer: Self-pay | Admitting: Family Medicine

## 2020-04-28 DIAGNOSIS — Z7401 Bed confinement status: Secondary | ICD-10-CM | POA: Diagnosis not present

## 2020-04-28 DIAGNOSIS — R42 Dizziness and giddiness: Secondary | ICD-10-CM | POA: Diagnosis not present

## 2020-04-28 DIAGNOSIS — I499 Cardiac arrhythmia, unspecified: Secondary | ICD-10-CM | POA: Diagnosis not present

## 2020-04-28 DIAGNOSIS — R531 Weakness: Secondary | ICD-10-CM | POA: Insufficient documentation

## 2020-04-28 DIAGNOSIS — Z7901 Long term (current) use of anticoagulants: Secondary | ICD-10-CM | POA: Insufficient documentation

## 2020-04-28 DIAGNOSIS — G4489 Other headache syndrome: Secondary | ICD-10-CM | POA: Diagnosis not present

## 2020-04-28 DIAGNOSIS — R5381 Other malaise: Secondary | ICD-10-CM | POA: Diagnosis not present

## 2020-04-28 DIAGNOSIS — R5383 Other fatigue: Secondary | ICD-10-CM | POA: Diagnosis not present

## 2020-04-28 DIAGNOSIS — R410 Disorientation, unspecified: Secondary | ICD-10-CM | POA: Diagnosis not present

## 2020-04-28 DIAGNOSIS — R404 Transient alteration of awareness: Secondary | ICD-10-CM | POA: Diagnosis not present

## 2020-04-28 DIAGNOSIS — R519 Headache, unspecified: Secondary | ICD-10-CM | POA: Diagnosis not present

## 2020-04-28 DIAGNOSIS — R55 Syncope and collapse: Secondary | ICD-10-CM | POA: Insufficient documentation

## 2020-04-28 DIAGNOSIS — J90 Pleural effusion, not elsewhere classified: Secondary | ICD-10-CM | POA: Diagnosis not present

## 2020-04-28 DIAGNOSIS — W19XXXA Unspecified fall, initial encounter: Secondary | ICD-10-CM | POA: Insufficient documentation

## 2020-04-28 DIAGNOSIS — R0902 Hypoxemia: Secondary | ICD-10-CM | POA: Diagnosis not present

## 2020-04-28 DIAGNOSIS — R6889 Other general symptoms and signs: Secondary | ICD-10-CM | POA: Diagnosis not present

## 2020-04-28 DIAGNOSIS — E1165 Type 2 diabetes mellitus with hyperglycemia: Secondary | ICD-10-CM | POA: Diagnosis not present

## 2020-04-28 DIAGNOSIS — M2550 Pain in unspecified joint: Secondary | ICD-10-CM | POA: Diagnosis not present

## 2020-04-28 DIAGNOSIS — Z743 Need for continuous supervision: Secondary | ICD-10-CM | POA: Diagnosis not present

## 2020-04-28 DIAGNOSIS — J3489 Other specified disorders of nose and nasal sinuses: Secondary | ICD-10-CM | POA: Diagnosis not present

## 2020-04-28 DIAGNOSIS — I4891 Unspecified atrial fibrillation: Secondary | ICD-10-CM | POA: Diagnosis not present

## 2020-04-28 LAB — I-STAT VENOUS BLOOD GAS, ED
Acid-Base Excess: 4 mmol/L — ABNORMAL HIGH (ref 0.0–2.0)
Bicarbonate: 26.4 mmol/L (ref 20.0–28.0)
Calcium, Ion: 0.95 mmol/L — ABNORMAL LOW (ref 1.15–1.40)
HCT: 44 % (ref 36.0–46.0)
Hemoglobin: 15 g/dL (ref 12.0–15.0)
O2 Saturation: 92 %
Potassium: 5.4 mmol/L — ABNORMAL HIGH (ref 3.5–5.1)
Sodium: 134 mmol/L — ABNORMAL LOW (ref 135–145)
TCO2: 27 mmol/L (ref 22–32)
pCO2, Ven: 31.1 mmHg — ABNORMAL LOW (ref 44.0–60.0)
pH, Ven: 7.537 — ABNORMAL HIGH (ref 7.250–7.430)
pO2, Ven: 55 mmHg — ABNORMAL HIGH (ref 32.0–45.0)

## 2020-04-28 LAB — CBC WITH DIFFERENTIAL/PLATELET
Abs Immature Granulocytes: 0.04 10*3/uL (ref 0.00–0.07)
Basophils Absolute: 0.1 10*3/uL (ref 0.0–0.1)
Basophils Relative: 1 %
Eosinophils Absolute: 0.3 10*3/uL (ref 0.0–0.5)
Eosinophils Relative: 2 %
HCT: 44.8 % (ref 36.0–46.0)
Hemoglobin: 14 g/dL (ref 12.0–15.0)
Immature Granulocytes: 0 %
Lymphocytes Relative: 28 %
Lymphs Abs: 3.2 10*3/uL (ref 0.7–4.0)
MCH: 25.7 pg — ABNORMAL LOW (ref 26.0–34.0)
MCHC: 31.3 g/dL (ref 30.0–36.0)
MCV: 82.4 fL (ref 80.0–100.0)
Monocytes Absolute: 0.8 10*3/uL (ref 0.1–1.0)
Monocytes Relative: 7 %
Neutro Abs: 7.1 10*3/uL (ref 1.7–7.7)
Neutrophils Relative %: 62 %
Platelets: 298 10*3/uL (ref 150–400)
RBC: 5.44 MIL/uL — ABNORMAL HIGH (ref 3.87–5.11)
RDW: 16.5 % — ABNORMAL HIGH (ref 11.5–15.5)
WBC: 11.5 10*3/uL — ABNORMAL HIGH (ref 4.0–10.5)
nRBC: 0 % (ref 0.0–0.2)

## 2020-04-28 LAB — COMPREHENSIVE METABOLIC PANEL
ALT: 16 U/L (ref 0–44)
AST: 28 U/L (ref 15–41)
Albumin: 3.3 g/dL — ABNORMAL LOW (ref 3.5–5.0)
Alkaline Phosphatase: 126 U/L (ref 38–126)
Anion gap: 15 (ref 5–15)
BUN: 18 mg/dL (ref 8–23)
CO2: 21 mmol/L — ABNORMAL LOW (ref 22–32)
Calcium: 9.1 mg/dL (ref 8.9–10.3)
Chloride: 99 mmol/L (ref 98–111)
Creatinine, Ser: 1.36 mg/dL — ABNORMAL HIGH (ref 0.44–1.00)
GFR, Estimated: 39 mL/min — ABNORMAL LOW (ref >60)
Glucose, Bld: 263 mg/dL — ABNORMAL HIGH (ref 70–99)
Potassium: 3.8 mmol/L (ref 3.5–5.1)
Sodium: 135 mmol/L (ref 135–145)
Total Bilirubin: 0.5 mg/dL (ref 0.3–1.2)
Total Protein: 7.2 g/dL (ref 6.5–8.1)

## 2020-04-28 LAB — URINALYSIS, ROUTINE W REFLEX MICROSCOPIC
Bilirubin Urine: NEGATIVE
Glucose, UA: >=500 mg/dL — AB
Hgb urine dipstick: NEGATIVE
Ketones, ur: NEGATIVE mg/dL
Nitrite: NEGATIVE
Protein, ur: 100 mg/dL — AB
Specific Gravity, Urine: 1.022 (ref 1.005–1.030)
pH: 5 (ref 5.0–8.0)

## 2020-04-28 LAB — MAGNESIUM: Magnesium: 1.6 mg/dL — ABNORMAL LOW (ref 1.7–2.4)

## 2020-04-28 LAB — TROPONIN I (HIGH SENSITIVITY): Troponin I (High Sensitivity): 34 ng/L — ABNORMAL HIGH (ref <18)

## 2020-04-28 MED ORDER — SODIUM CHLORIDE 0.9 % IV BOLUS: 1000.0000 mL | Freq: Once | INTRAVENOUS | Status: DC

## 2020-04-28 MED ORDER — MAGNESIUM OXIDE 400 (241.3 MG) MG PO TABS
400.0000 mg | ORAL_TABLET | Freq: Once | ORAL | Status: AC
  Administered 2020-04-28: 400 mg via ORAL
  Filled 2020-04-28: qty 1

## 2020-04-28 NOTE — ED Notes (Signed)
Spoke with [facility].  They state there is one pt ahead of her at this time.

## 2020-04-28 NOTE — ED Provider Notes (Signed)
[facility] EMERGENCY DEPARTMENT Provider Note   CSN: [id number] Arrival date & time: 04/28/20  1131     History Chief Complaint  Patient presents with  . Level II, fall on thinners    Casey Harrell is a 85 y.o. female.  The history is provided by the patient, the EMS personnel and medical records. No language interpreter was used.  Loss of Consciousness Episode history:  Single Most recent episode:  Today Timing:  Unable to specify Progression:  Resolved Chronicity:  New Context: exertion   Witnessed: yes   Relieved by:  Nothing Worsened by:  Nothing Ineffective treatments:  None tried Associated symptoms: dizziness, headaches and malaise/fatigue   Associated symptoms: no chest pain, no confusion, no diaphoresis, no difficulty breathing, no fever, no nausea, no seizures, no shortness of breath, no vomiting and no weakness        No past medical history on file.  There are no problems to display for this patient.    OB History   No obstetric history on file.     No family history on file.     Home Medications Prior to Admission medications   Not on File    Allergies    Patient has no allergy information on record.  Review of Systems   Review of Systems  Constitutional: Positive for fatigue and malaise/fatigue. Negative for chills, diaphoresis and fever.  HENT: Negative for congestion.   Eyes: Negative for visual disturbance.  Respiratory: Negative for cough, chest tightness, shortness of breath and wheezing.   Cardiovascular: Positive for syncope. Negative for chest pain.  Gastrointestinal: Negative for abdominal pain, constipation, diarrhea, nausea and vomiting.  Genitourinary: Negative for dysuria and frequency.  Musculoskeletal: Negative for back pain and neck pain.  Skin: Negative for wound.  Neurological: Positive for dizziness, syncope, light-headedness and headaches. Negative for seizures, weakness and numbness.   Psychiatric/Behavioral: Negative for agitation and confusion.  All other systems reviewed and are negative.   Physical Exam Updated Vital Signs BP (!) 160/90   Pulse 92   Temp 97.6 F (36.4 C) (Oral)   Resp (!) 25   Ht 5\' 1"  (1.549 m)   Wt 61.2 kg   SpO2 92%   BMI 25.51 kg/m   Physical Exam Vitals and nursing note reviewed.  Constitutional:      General: She is not in acute distress.    Appearance: She is well-developed and well-nourished. She is not ill-appearing, toxic-appearing or diaphoretic.  HENT:     Head: Normocephalic and atraumatic.     Right Ear: External ear normal.     Left Ear: External ear normal.     Nose: Nose normal. No congestion or rhinorrhea.     Mouth/Throat:     Mouth: Oropharynx is clear and moist. Mucous membranes are moist.     Pharynx: No oropharyngeal exudate or posterior oropharyngeal erythema.  Eyes:     Extraocular Movements: Extraocular movements intact and EOM normal.     Conjunctiva/sclera: Conjunctivae normal.     Pupils: Pupils are equal, round, and reactive to light.  Cardiovascular:     Rate and Rhythm: Normal rate and regular rhythm.     Pulses: Normal pulses.  Pulmonary:     Effort: Pulmonary effort is normal. No respiratory distress.     Breath sounds: No stridor. No wheezing, rhonchi or rales.  Chest:     Chest wall: No tenderness.  Abdominal:     General: Abdomen is flat. There is  no distension.     Tenderness: There is no abdominal tenderness. There is no right CVA tenderness, left CVA tenderness, guarding or rebound.  Musculoskeletal:        General: No tenderness.     Cervical back: Normal range of motion and neck supple. No tenderness.  Skin:    General: Skin is warm.     Capillary Refill: Capillary refill takes less than 2 seconds.     Coloration: Skin is not pale.     Findings: No erythema or rash.  Neurological:     General: No focal deficit present.     Mental Status: She is alert. Mental status is at  baseline. She is disoriented.     GCS: GCS eye subscore is 4. GCS verbal subscore is 5. GCS motor subscore is 6.     Cranial Nerves: No cranial nerve deficit or dysarthria.     Sensory: No sensory deficit.     Motor: Weakness present. No tremor, abnormal muscle tone or seizure activity.     Deep Tendon Reflexes: Reflexes are normal and symmetric.     Comments: Subtle weakness in L leg over right. Pt reports it is chronic   Psychiatric:        Mood and Affect: Mood normal.     ED Results / Procedures / Treatments   Labs (all labs ordered are listed, but only abnormal results are displayed) Labs Reviewed  CBC WITH DIFFERENTIAL/PLATELET - Abnormal; Notable for the following components:      Result Value   WBC 11.5 (*)    RBC 5.44 (*)    MCH 25.7 (*)    RDW 16.5 (*)    All other components within normal limits  I-STAT VENOUS BLOOD GAS, ED - Abnormal; Notable for the following components:   pH, Ven 7.537 (*)    pCO2, Ven 31.1 (*)    pO2, Ven 55.0 (*)    Acid-Base Excess 4.0 (*)    Sodium 134 (*)    Potassium 5.4 (*)    Calcium, Ion 0.95 (*)    All other components within normal limits  TROPONIN I (HIGH SENSITIVITY) - Abnormal; Notable for the following components:   Troponin I (High Sensitivity) 34 (*)    All other components within normal limits  URINE CULTURE  URINALYSIS, ROUTINE W REFLEX MICROSCOPIC  COMPREHENSIVE METABOLIC PANEL  MAGNESIUM    EKG EKG Interpretation  Date/Time:  Wednesday April 28 2020 11:35:09 EST Ventricular Rate:  92 PR Interval:    QRS Duration: 80 QT Interval:  384 QTC Calculation: 475 R Axis:   10 Text Interpretation: Sinus rhythm Low voltage, precordial leads No prior ECg for comparison. No STEMI Confirmed by Theda Belfast ([id number]) on 04/28/2020 11:41:18 AM   Radiology CT Head Wo Contrast  Result Date: 04/28/2020 CLINICAL DATA:  Headache after a fall.  Initial encounter. EXAM: CT HEAD WITHOUT CONTRAST CT CERVICAL SPINE WITHOUT  CONTRAST TECHNIQUE: Multidetector CT imaging of the head and cervical spine was performed following the standard protocol without intravenous contrast. Multiplanar CT image reconstructions of the cervical spine were also generated. COMPARISON:  Head and cervical spine CT scans 01/05/2020. FINDINGS: CT HEAD FINDINGS Brain: No evidence of acute infarction, hemorrhage, hydrocephalus, extra-axial collection or mass lesion/mass effect. Atrophy, chronic microvascular ischemic change and remote left MCA/PCA infarcts again seen. Vascular: No hyperdense vessel or unexpected calcification. Skull: Intact.  No focal lesion. Sinuses/Orbits: Mild mucosal thickening in the sphenoid sinuses and left maxillary sinus noted. Other: None. CT  CERVICAL SPINE FINDINGS Alignment: Trace facet mediated anterolisthesis C4 on C5 due to facet arthropathy is unchanged. Skull base and vertebrae: No acute fracture. No primary bone lesion or focal pathologic process. Soft tissues and spinal canal: No prevertebral fluid or swelling. No visible canal hematoma. Disc levels: Loss of disc space height is worst at C5-6 and C6-7, unchanged. Upper chest: Lung apices are clear. Other: None. IMPRESSION: No acute abnormality head or cervical spine. Atrophy, chronic microvascular ischemic change and remote infarcts as described above. Cervical spondylosis. Electronically Signed   By: Drusilla Kannerhomas  Dalessio M.D.   On: 04/28/2020 12:54   CT Cervical Spine Wo Contrast  Result Date: 04/28/2020 CLINICAL DATA:  Headache after a fall.  Initial encounter. EXAM: CT HEAD WITHOUT CONTRAST CT CERVICAL SPINE WITHOUT CONTRAST TECHNIQUE: Multidetector CT imaging of the head and cervical spine was performed following the standard protocol without intravenous contrast. Multiplanar CT image reconstructions of the cervical spine were also generated. COMPARISON:  Head and cervical spine CT scans 01/05/2020. FINDINGS: CT HEAD FINDINGS Brain: No evidence of acute infarction,  hemorrhage, hydrocephalus, extra-axial collection or mass lesion/mass effect. Atrophy, chronic microvascular ischemic change and remote left MCA/PCA infarcts again seen. Vascular: No hyperdense vessel or unexpected calcification. Skull: Intact.  No focal lesion. Sinuses/Orbits: Mild mucosal thickening in the sphenoid sinuses and left maxillary sinus noted. Other: None. CT CERVICAL SPINE FINDINGS Alignment: Trace facet mediated anterolisthesis C4 on C5 due to facet arthropathy is unchanged. Skull base and vertebrae: No acute fracture. No primary bone lesion or focal pathologic process. Soft tissues and spinal canal: No prevertebral fluid or swelling. No visible canal hematoma. Disc levels: Loss of disc space height is worst at C5-6 and C6-7, unchanged. Upper chest: Lung apices are clear. Other: None. IMPRESSION: No acute abnormality head or cervical spine. Atrophy, chronic microvascular ischemic change and remote infarcts as described above. Cervical spondylosis. Electronically Signed   By: Drusilla Kannerhomas  Dalessio M.D.   On: 04/28/2020 12:54   DG Chest Portable 1 View  Result Date: 04/28/2020 CLINICAL DATA:  Trauma.  Fall. EXAM: PORTABLE CHEST 1 VIEW COMPARISON:  March 16, 2019. FINDINGS: Similar cardiomediastinal silhouette. Both lungs are clear. No visible pleural effusions or pneumothorax. No acute osseous abnormality. Similar chronic elevation the right hemidiaphragm. IMPRESSION: No active disease. Electronically Signed   By: Feliberto HartsFrederick S Jones MD   On: 04/28/2020 11:48    Procedures Procedures (including critical care time)  Medications Ordered in ED Medications  sodium chloride 0.9 % bolus 1,000 mL (has no administration in time range)    ED Course  I have reviewed the triage vital signs and the nursing notes.  Pertinent labs & imaging results that were available during my care of the patient were reviewed by me and considered in my medical decision making (see chart for details).    MDM  Rules/Calculators/A&P                          Casey Harrell is a 85 y.o. female with a past medical history significant for atrial fibrillation on Eliquis therapy, prior stroke, peripheral vascular disease, hypertension, hyperlipidemia, GERD, diverticulosis, prior small bowel obstruction, and recurrent falls who presents with a syncope/fall as a level 2 trauma.  According to EMS, patient was at rehab when she was exercising of some kind when she started to go to the ground.  She reportedly collapsed towards her knees and then as she was passing out, fell backwards hitting the back  of her head on the ground.  She was unconscious for a brief period of time before waking back up.  Patient was describing lightheadedness, dizziness, and moderate headache in the back of her head.  She denied laceration.  She denied any preceding chest pain, palpitations, shortness of breath.  She denies any recent fevers, chills, chest pain, shortness of breath, cough, nausea, vomiting, urinary changes.  She denies vision changes.  She reports she always has some weakness troubles with her prior strokes.  She denies recent urinary changes, constipation, or diarrhea.  No other complaints.  EMS reports glucose was elevated in the 400s which she reportedly has had in the past.  EMS also says that patient has had episodes of hypotension recently and they have been decreasing her blood pressure medications.  On arrival, airway is intact.  Breath sounds are equal bilaterally.  Initial blood pressures in the 130 systolic.  She is mentating at her baseline reportedly.  Patient will have a portable chest x-ray to not only look for a cause of syncopal episode but also to look for occult infection leading to the syncope.  Patient does have some edema in both legs.  Chest and abdomen nontender.  Patient is resting comfortably now.  Due to her blood thinner use, will get CT head and neck.  We will get work-up to look for occult infection  leading to syncopal episode.  We will get a VBG to rule out DKA.  Given the patient's age of 52 with syncopal episode and her history, anticipate shared decision-making conversation about disposition after work-up is complete  3:17 PM CT head and CT C-spine not show acute fracture or bleeding.  Collar was removed.  Due to the patient's syncope and reported hyperglycemia before arrival, we will still wait for the metabolic panel, magnesium, and urinalysis to look for occult infection.  Chest x-ray did not show pneumonia.  Patient does have a leukocytosis but has no anemia.  Troponin slightly elevated at 34, will trend.  I-STAT VBG does not show acidosis, doubt we will find DKA.  Care transferred to oncoming team while waiting for reassessment and the labs to be completed.  If Patient does not have any concerning findings and has a shared decision-making conversation, patient may be stable for discharge back to facility.   Final Clinical Impression(s) / ED Diagnoses Final diagnoses:  Syncope, unspecified syncope type  Fall, initial encounter    Clinical Impression: 1. Syncope, unspecified syncope type   2. Fall, initial encounter     Disposition: Care transferred to oncoming team while waiting for reassessment and the labs to be completed.  If Patient does not have any concerning findings and has a shared decision-making conversation, patient may be stable for discharge back to facility.  This note was prepared with assistance of Conservation officer, historic buildings. Occasional wrong-word or sound-a-like substitutions may have occurred due to the inherent limitations of voice recognition software.      Kenton Fortin, Canary Brim, MD 04/28/20 [phone]

## 2020-04-28 NOTE — Progress Notes (Signed)
Orthopedic Tech Progress Note Patient Details:  Casey Harrell 1934-10-19 [id number] LEVEL 2 TRAUMA Patient ID: [name], female   DOB: Jun 09, 1934, 85 y.o.   MRN: [id number]   Donald Pore 04/28/2020, 11:38 AM

## 2020-04-28 NOTE — Telephone Encounter (Signed)
I faxed Arta Silence the Therapy Orders to [phone]  Fax confirmation successful

## 2020-04-28 NOTE — ED Notes (Signed)
Just spoke with pt's son & updated him about pt's status & to verify the location of where she lives so transportation can be called.

## 2020-04-28 NOTE — ED Notes (Signed)
Patient transported to CT 

## 2020-04-28 NOTE — ED Notes (Signed)
Pt trying to get out of bed, given water, currently waiting on PTAR

## 2020-04-28 NOTE — ED Provider Notes (Signed)
  Physical Exam  BP (!) 160/96   Pulse (!) 101   Temp 97.6 F (36.4 C) (Oral)   Resp 20   Ht 5\' 1"  (1.549 m)   Wt 61.2 kg   SpO2 98%   BMI 25.51 kg/m   Physical Exam  ED Course/Procedures     Procedures  MDM  Care assumed at 3 PM. Patient is here with unwitnessed fall. Came in as a level 2 trauma since she is on Eliquis. Patient cannot tell me how she fell. CT unremarkable and signout pending labs  4:56 PM Labs show glucose of 263. Her magnesium level was slightly low and was supplemented. Her initial troponin was 43 and she refused a second stick. She denies any chest pain. She requests discharge back to facility. Stable for discharge       , MD 04/28/20 [phone]

## 2020-04-28 NOTE — ED Notes (Signed)
PTAR called @ 1748-per Idalia Needle, RN called by Marylene Land

## 2020-04-28 NOTE — Discharge Instructions (Signed)
Continue your current meds   See your doctor for follow up   Return to ER if you have another fall, passing out

## 2020-04-28 NOTE — ED Triage Notes (Signed)
Pt BIB [facility] d/t fall on thinners. Pt was walking & staff of the facility she lives at saw her start to slup seconds before she fell backwards & hit her head on a door as she went to the ground. Upon arrival pt is Alert & confused at baseline, c-collar in place, VSS.

## 2020-04-28 NOTE — ED Notes (Addendum)
Pt is tearful, continues to state that she wants to come.  This nurse tries to encourage her every so often. Still waiting for PTAR.

## 2020-04-28 NOTE — ED Notes (Signed)
DC instructions reviewed with pt and PTAR and handed to PTAR. Pt DC.

## 2020-04-29 ENCOUNTER — Encounter (HOSPITAL_COMMUNITY): Payer: Self-pay

## 2020-04-29 DIAGNOSIS — M6281 Muscle weakness (generalized): Secondary | ICD-10-CM | POA: Diagnosis not present

## 2020-04-29 DIAGNOSIS — R279 Unspecified lack of coordination: Secondary | ICD-10-CM | POA: Diagnosis not present

## 2020-04-29 DIAGNOSIS — R296 Repeated falls: Secondary | ICD-10-CM | POA: Diagnosis not present

## 2020-04-29 DIAGNOSIS — R488 Other symbolic dysfunctions: Secondary | ICD-10-CM | POA: Diagnosis not present

## 2020-04-30 LAB — URINE CULTURE: Culture: >=100000 — AB

## 2020-05-02 ENCOUNTER — Telehealth (HOSPITAL_BASED_OUTPATIENT_CLINIC_OR_DEPARTMENT_OTHER): Payer: Self-pay | Admitting: Emergency Medicine

## 2020-05-02 NOTE — Telephone Encounter (Signed)
Post ED Visit - Positive Culture Follow-up  Culture report reviewed by antimicrobial stewardship pharmacist: [facility] []  , Pharm.D. []  Enzo Bi, Pharm.D., BCPS AQ-ID []  , Pharm.D., BCPS []  Celedonio Miyamoto, Pharm.D., BCPS []  [place], Garvin Fila.D., BCPS, AAHIVP []  , Pharm.D., BCPS, AAHIVP []  Georgina Pillion, PharmD, BCPS []  , PharmD, BCPS []  [place], PharmD, BCPS [x]  [address], PharmD []  , PharmD, BCPS []  Estella Husk, PharmD  Pharmacy Team []  Lysle Pearl, PharmD []  , PharmD []  Phillips Climes, PharmD []  , Rph []  Agapito Games) , PharmD []  Yvetta Coder, PharmD []  , PharmD []  Mervyn Gay, PharmD []  , PharmD []  Vinnie Level, PharmD []  [facility], PharmD []  , PharmD []  Len Childs, PharmD   Positive urine culture No further patient follow-up is required at this time.  05/02/2020, 6:25 PM

## 2020-05-04 DIAGNOSIS — R279 Unspecified lack of coordination: Secondary | ICD-10-CM | POA: Diagnosis not present

## 2020-05-04 DIAGNOSIS — M6281 Muscle weakness (generalized): Secondary | ICD-10-CM | POA: Diagnosis not present

## 2020-05-04 DIAGNOSIS — R488 Other symbolic dysfunctions: Secondary | ICD-10-CM | POA: Diagnosis not present

## 2020-05-04 DIAGNOSIS — R296 Repeated falls: Secondary | ICD-10-CM | POA: Diagnosis not present

## 2020-05-05 ENCOUNTER — Other Ambulatory Visit: Payer: Self-pay | Admitting: Family Medicine

## 2020-05-05 DIAGNOSIS — R296 Repeated falls: Secondary | ICD-10-CM | POA: Diagnosis not present

## 2020-05-05 DIAGNOSIS — R279 Unspecified lack of coordination: Secondary | ICD-10-CM | POA: Diagnosis not present

## 2020-05-05 DIAGNOSIS — M6281 Muscle weakness (generalized): Secondary | ICD-10-CM | POA: Diagnosis not present

## 2020-05-06 DIAGNOSIS — R279 Unspecified lack of coordination: Secondary | ICD-10-CM | POA: Diagnosis not present

## 2020-05-06 DIAGNOSIS — R296 Repeated falls: Secondary | ICD-10-CM | POA: Diagnosis not present

## 2020-05-06 DIAGNOSIS — M6281 Muscle weakness (generalized): Secondary | ICD-10-CM | POA: Diagnosis not present

## 2020-05-06 DIAGNOSIS — R488 Other symbolic dysfunctions: Secondary | ICD-10-CM | POA: Diagnosis not present

## 2020-05-07 ENCOUNTER — Other Ambulatory Visit: Payer: Self-pay | Admitting: Family Medicine

## 2020-05-07 DIAGNOSIS — R488 Other symbolic dysfunctions: Secondary | ICD-10-CM | POA: Diagnosis not present

## 2020-05-09 ENCOUNTER — Other Ambulatory Visit: Payer: Self-pay | Admitting: Family Medicine

## 2020-05-10 DIAGNOSIS — M6281 Muscle weakness (generalized): Secondary | ICD-10-CM | POA: Diagnosis not present

## 2020-05-10 DIAGNOSIS — R279 Unspecified lack of coordination: Secondary | ICD-10-CM | POA: Diagnosis not present

## 2020-05-10 DIAGNOSIS — R296 Repeated falls: Secondary | ICD-10-CM | POA: Diagnosis not present

## 2020-05-11 DIAGNOSIS — R296 Repeated falls: Secondary | ICD-10-CM | POA: Diagnosis not present

## 2020-05-11 DIAGNOSIS — R279 Unspecified lack of coordination: Secondary | ICD-10-CM | POA: Diagnosis not present

## 2020-05-11 DIAGNOSIS — M6281 Muscle weakness (generalized): Secondary | ICD-10-CM | POA: Diagnosis not present

## 2020-05-12 DIAGNOSIS — M6281 Muscle weakness (generalized): Secondary | ICD-10-CM | POA: Diagnosis not present

## 2020-05-12 DIAGNOSIS — R279 Unspecified lack of coordination: Secondary | ICD-10-CM | POA: Diagnosis not present

## 2020-05-12 DIAGNOSIS — R488 Other symbolic dysfunctions: Secondary | ICD-10-CM | POA: Diagnosis not present

## 2020-05-12 DIAGNOSIS — R296 Repeated falls: Secondary | ICD-10-CM | POA: Diagnosis not present

## 2020-05-13 DIAGNOSIS — R488 Other symbolic dysfunctions: Secondary | ICD-10-CM | POA: Diagnosis not present

## 2020-05-14 DIAGNOSIS — R488 Other symbolic dysfunctions: Secondary | ICD-10-CM | POA: Diagnosis not present

## 2020-05-17 DIAGNOSIS — M6281 Muscle weakness (generalized): Secondary | ICD-10-CM | POA: Diagnosis not present

## 2020-05-17 DIAGNOSIS — R279 Unspecified lack of coordination: Secondary | ICD-10-CM | POA: Diagnosis not present

## 2020-05-17 DIAGNOSIS — R488 Other symbolic dysfunctions: Secondary | ICD-10-CM | POA: Diagnosis not present

## 2020-05-17 DIAGNOSIS — R296 Repeated falls: Secondary | ICD-10-CM | POA: Diagnosis not present

## 2020-05-18 DIAGNOSIS — M6281 Muscle weakness (generalized): Secondary | ICD-10-CM | POA: Diagnosis not present

## 2020-05-18 DIAGNOSIS — R279 Unspecified lack of coordination: Secondary | ICD-10-CM | POA: Diagnosis not present

## 2020-05-18 DIAGNOSIS — R296 Repeated falls: Secondary | ICD-10-CM | POA: Diagnosis not present

## 2020-05-19 DIAGNOSIS — R296 Repeated falls: Secondary | ICD-10-CM | POA: Diagnosis not present

## 2020-05-19 DIAGNOSIS — M6281 Muscle weakness (generalized): Secondary | ICD-10-CM | POA: Diagnosis not present

## 2020-05-19 DIAGNOSIS — R279 Unspecified lack of coordination: Secondary | ICD-10-CM | POA: Diagnosis not present

## 2020-05-20 DIAGNOSIS — R488 Other symbolic dysfunctions: Secondary | ICD-10-CM | POA: Diagnosis not present

## 2020-05-21 DIAGNOSIS — R488 Other symbolic dysfunctions: Secondary | ICD-10-CM | POA: Diagnosis not present

## 2020-05-25 DIAGNOSIS — R296 Repeated falls: Secondary | ICD-10-CM | POA: Diagnosis not present

## 2020-05-25 DIAGNOSIS — M6281 Muscle weakness (generalized): Secondary | ICD-10-CM | POA: Diagnosis not present

## 2020-05-25 DIAGNOSIS — R279 Unspecified lack of coordination: Secondary | ICD-10-CM | POA: Diagnosis not present

## 2020-05-26 DIAGNOSIS — M6281 Muscle weakness (generalized): Secondary | ICD-10-CM | POA: Diagnosis not present

## 2020-05-26 DIAGNOSIS — R296 Repeated falls: Secondary | ICD-10-CM | POA: Diagnosis not present

## 2020-05-26 DIAGNOSIS — R279 Unspecified lack of coordination: Secondary | ICD-10-CM | POA: Diagnosis not present

## 2020-05-27 DIAGNOSIS — R279 Unspecified lack of coordination: Secondary | ICD-10-CM | POA: Diagnosis not present

## 2020-05-27 DIAGNOSIS — M6281 Muscle weakness (generalized): Secondary | ICD-10-CM | POA: Diagnosis not present

## 2020-05-27 DIAGNOSIS — R488 Other symbolic dysfunctions: Secondary | ICD-10-CM | POA: Diagnosis not present

## 2020-05-27 DIAGNOSIS — R296 Repeated falls: Secondary | ICD-10-CM | POA: Diagnosis not present

## 2020-05-28 DIAGNOSIS — R488 Other symbolic dysfunctions: Secondary | ICD-10-CM | POA: Diagnosis not present

## 2020-05-31 DIAGNOSIS — R279 Unspecified lack of coordination: Secondary | ICD-10-CM | POA: Diagnosis not present

## 2020-05-31 DIAGNOSIS — R488 Other symbolic dysfunctions: Secondary | ICD-10-CM | POA: Diagnosis not present

## 2020-05-31 DIAGNOSIS — R296 Repeated falls: Secondary | ICD-10-CM | POA: Diagnosis not present

## 2020-05-31 DIAGNOSIS — M6281 Muscle weakness (generalized): Secondary | ICD-10-CM | POA: Diagnosis not present

## 2020-06-01 DIAGNOSIS — R488 Other symbolic dysfunctions: Secondary | ICD-10-CM | POA: Diagnosis not present

## 2020-06-02 DIAGNOSIS — R488 Other symbolic dysfunctions: Secondary | ICD-10-CM | POA: Diagnosis not present

## 2020-06-02 DIAGNOSIS — R279 Unspecified lack of coordination: Secondary | ICD-10-CM | POA: Diagnosis not present

## 2020-06-02 DIAGNOSIS — M6281 Muscle weakness (generalized): Secondary | ICD-10-CM | POA: Diagnosis not present

## 2020-06-02 DIAGNOSIS — R296 Repeated falls: Secondary | ICD-10-CM | POA: Diagnosis not present

## 2020-06-03 DIAGNOSIS — M6281 Muscle weakness (generalized): Secondary | ICD-10-CM | POA: Diagnosis not present

## 2020-06-03 DIAGNOSIS — R296 Repeated falls: Secondary | ICD-10-CM | POA: Diagnosis not present

## 2020-06-03 DIAGNOSIS — R279 Unspecified lack of coordination: Secondary | ICD-10-CM | POA: Diagnosis not present

## 2020-06-04 DIAGNOSIS — R488 Other symbolic dysfunctions: Secondary | ICD-10-CM | POA: Diagnosis not present

## 2020-06-07 DIAGNOSIS — M6281 Muscle weakness (generalized): Secondary | ICD-10-CM | POA: Diagnosis not present

## 2020-06-07 DIAGNOSIS — R488 Other symbolic dysfunctions: Secondary | ICD-10-CM | POA: Diagnosis not present

## 2020-06-07 DIAGNOSIS — R296 Repeated falls: Secondary | ICD-10-CM | POA: Diagnosis not present

## 2020-06-07 DIAGNOSIS — R279 Unspecified lack of coordination: Secondary | ICD-10-CM | POA: Diagnosis not present

## 2020-06-08 DIAGNOSIS — R488 Other symbolic dysfunctions: Secondary | ICD-10-CM | POA: Diagnosis not present

## 2020-06-10 DIAGNOSIS — R296 Repeated falls: Secondary | ICD-10-CM | POA: Diagnosis not present

## 2020-06-10 DIAGNOSIS — R279 Unspecified lack of coordination: Secondary | ICD-10-CM | POA: Diagnosis not present

## 2020-06-10 DIAGNOSIS — M6281 Muscle weakness (generalized): Secondary | ICD-10-CM | POA: Diagnosis not present

## 2020-06-10 DIAGNOSIS — R488 Other symbolic dysfunctions: Secondary | ICD-10-CM | POA: Diagnosis not present

## 2020-06-11 DIAGNOSIS — M6281 Muscle weakness (generalized): Secondary | ICD-10-CM | POA: Diagnosis not present

## 2020-06-11 DIAGNOSIS — R296 Repeated falls: Secondary | ICD-10-CM | POA: Diagnosis not present

## 2020-06-11 DIAGNOSIS — R279 Unspecified lack of coordination: Secondary | ICD-10-CM | POA: Diagnosis not present

## 2020-06-14 DIAGNOSIS — R488 Other symbolic dysfunctions: Secondary | ICD-10-CM | POA: Diagnosis not present

## 2020-06-15 DIAGNOSIS — R279 Unspecified lack of coordination: Secondary | ICD-10-CM | POA: Diagnosis not present

## 2020-06-15 DIAGNOSIS — R296 Repeated falls: Secondary | ICD-10-CM | POA: Diagnosis not present

## 2020-06-15 DIAGNOSIS — M6281 Muscle weakness (generalized): Secondary | ICD-10-CM | POA: Diagnosis not present

## 2020-06-16 DIAGNOSIS — I10 Essential (primary) hypertension: Secondary | ICD-10-CM | POA: Diagnosis not present

## 2020-06-16 DIAGNOSIS — I480 Paroxysmal atrial fibrillation: Secondary | ICD-10-CM | POA: Diagnosis not present

## 2020-06-16 DIAGNOSIS — Z794 Long term (current) use of insulin: Secondary | ICD-10-CM | POA: Diagnosis not present

## 2020-06-16 DIAGNOSIS — Z8673 Personal history of transient ischemic attack (TIA), and cerebral infarction without residual deficits: Secondary | ICD-10-CM | POA: Diagnosis not present

## 2020-06-16 DIAGNOSIS — E1165 Type 2 diabetes mellitus with hyperglycemia: Secondary | ICD-10-CM | POA: Diagnosis not present

## 2020-06-16 DIAGNOSIS — Z0000 Encounter for general adult medical examination without abnormal findings: Secondary | ICD-10-CM | POA: Diagnosis not present

## 2020-06-16 DIAGNOSIS — R413 Other amnesia: Secondary | ICD-10-CM | POA: Diagnosis not present

## 2020-06-17 DIAGNOSIS — E1165 Type 2 diabetes mellitus with hyperglycemia: Secondary | ICD-10-CM | POA: Diagnosis not present

## 2020-06-17 DIAGNOSIS — R279 Unspecified lack of coordination: Secondary | ICD-10-CM | POA: Diagnosis not present

## 2020-06-17 DIAGNOSIS — R296 Repeated falls: Secondary | ICD-10-CM | POA: Diagnosis not present

## 2020-06-17 DIAGNOSIS — R488 Other symbolic dysfunctions: Secondary | ICD-10-CM | POA: Diagnosis not present

## 2020-06-17 DIAGNOSIS — Z0000 Encounter for general adult medical examination without abnormal findings: Secondary | ICD-10-CM | POA: Diagnosis not present

## 2020-06-17 DIAGNOSIS — M6281 Muscle weakness (generalized): Secondary | ICD-10-CM | POA: Diagnosis not present

## 2020-06-17 DIAGNOSIS — Z794 Long term (current) use of insulin: Secondary | ICD-10-CM | POA: Diagnosis not present

## 2020-06-18 DIAGNOSIS — R279 Unspecified lack of coordination: Secondary | ICD-10-CM | POA: Diagnosis not present

## 2020-06-18 DIAGNOSIS — R488 Other symbolic dysfunctions: Secondary | ICD-10-CM | POA: Diagnosis not present

## 2020-06-18 DIAGNOSIS — R296 Repeated falls: Secondary | ICD-10-CM | POA: Diagnosis not present

## 2020-06-18 DIAGNOSIS — M6281 Muscle weakness (generalized): Secondary | ICD-10-CM | POA: Diagnosis not present

## 2020-06-21 DIAGNOSIS — R488 Other symbolic dysfunctions: Secondary | ICD-10-CM | POA: Diagnosis not present

## 2020-06-22 ENCOUNTER — Other Ambulatory Visit: Payer: Self-pay | Admitting: Family Medicine

## 2020-06-22 NOTE — Telephone Encounter (Signed)
Last office visit- 03/24/2020 No recent labs for thyroid levels  No future visit scheduled

## 2020-06-22 NOTE — Telephone Encounter (Signed)
Pt needs follow up.   No recent labs.

## 2020-06-23 DIAGNOSIS — R296 Repeated falls: Secondary | ICD-10-CM | POA: Diagnosis not present

## 2020-06-23 DIAGNOSIS — M6281 Muscle weakness (generalized): Secondary | ICD-10-CM | POA: Diagnosis not present

## 2020-06-23 DIAGNOSIS — R279 Unspecified lack of coordination: Secondary | ICD-10-CM | POA: Diagnosis not present

## 2020-06-25 DIAGNOSIS — M6281 Muscle weakness (generalized): Secondary | ICD-10-CM | POA: Diagnosis not present

## 2020-06-25 DIAGNOSIS — R279 Unspecified lack of coordination: Secondary | ICD-10-CM | POA: Diagnosis not present

## 2020-06-25 DIAGNOSIS — R296 Repeated falls: Secondary | ICD-10-CM | POA: Diagnosis not present

## 2020-06-28 DIAGNOSIS — M6281 Muscle weakness (generalized): Secondary | ICD-10-CM | POA: Diagnosis not present

## 2020-06-28 DIAGNOSIS — R296 Repeated falls: Secondary | ICD-10-CM | POA: Diagnosis not present

## 2020-06-28 DIAGNOSIS — R279 Unspecified lack of coordination: Secondary | ICD-10-CM | POA: Diagnosis not present

## 2020-06-29 DIAGNOSIS — R296 Repeated falls: Secondary | ICD-10-CM | POA: Diagnosis not present

## 2020-06-29 DIAGNOSIS — M6281 Muscle weakness (generalized): Secondary | ICD-10-CM | POA: Diagnosis not present

## 2020-06-29 DIAGNOSIS — R279 Unspecified lack of coordination: Secondary | ICD-10-CM | POA: Diagnosis not present

## 2020-07-02 DIAGNOSIS — R296 Repeated falls: Secondary | ICD-10-CM | POA: Diagnosis not present

## 2020-07-02 DIAGNOSIS — R279 Unspecified lack of coordination: Secondary | ICD-10-CM | POA: Diagnosis not present

## 2020-07-02 DIAGNOSIS — M6281 Muscle weakness (generalized): Secondary | ICD-10-CM | POA: Diagnosis not present

## 2020-07-05 DIAGNOSIS — M6281 Muscle weakness (generalized): Secondary | ICD-10-CM | POA: Diagnosis not present

## 2020-07-05 DIAGNOSIS — R279 Unspecified lack of coordination: Secondary | ICD-10-CM | POA: Diagnosis not present

## 2020-07-05 DIAGNOSIS — R296 Repeated falls: Secondary | ICD-10-CM | POA: Diagnosis not present

## 2020-07-07 DIAGNOSIS — R296 Repeated falls: Secondary | ICD-10-CM | POA: Diagnosis not present

## 2020-07-07 DIAGNOSIS — M6281 Muscle weakness (generalized): Secondary | ICD-10-CM | POA: Diagnosis not present

## 2020-07-07 DIAGNOSIS — R279 Unspecified lack of coordination: Secondary | ICD-10-CM | POA: Diagnosis not present

## 2020-07-08 DIAGNOSIS — R296 Repeated falls: Secondary | ICD-10-CM | POA: Diagnosis not present

## 2020-07-08 DIAGNOSIS — R279 Unspecified lack of coordination: Secondary | ICD-10-CM | POA: Diagnosis not present

## 2020-07-08 DIAGNOSIS — M6281 Muscle weakness (generalized): Secondary | ICD-10-CM | POA: Diagnosis not present

## 2020-07-12 DIAGNOSIS — R296 Repeated falls: Secondary | ICD-10-CM | POA: Diagnosis not present

## 2020-07-12 DIAGNOSIS — M6281 Muscle weakness (generalized): Secondary | ICD-10-CM | POA: Diagnosis not present

## 2020-07-12 DIAGNOSIS — R279 Unspecified lack of coordination: Secondary | ICD-10-CM | POA: Diagnosis not present

## 2020-07-14 DIAGNOSIS — M6281 Muscle weakness (generalized): Secondary | ICD-10-CM | POA: Diagnosis not present

## 2020-07-14 DIAGNOSIS — R296 Repeated falls: Secondary | ICD-10-CM | POA: Diagnosis not present

## 2020-07-14 DIAGNOSIS — R279 Unspecified lack of coordination: Secondary | ICD-10-CM | POA: Diagnosis not present

## 2020-07-15 ENCOUNTER — Encounter (HOSPITAL_COMMUNITY): Payer: Self-pay | Admitting: Emergency Medicine

## 2020-07-15 ENCOUNTER — Emergency Department (HOSPITAL_COMMUNITY): Payer: Medicare Other

## 2020-07-15 ENCOUNTER — Emergency Department (HOSPITAL_COMMUNITY)
Admission: EM | Admit: 2020-07-15 | Discharge: 2020-07-15 | Disposition: A | Discharge status: Home / Self Care | Payer: Medicare Other | Attending: Emergency Medicine | Admitting: Emergency Medicine

## 2020-07-15 ENCOUNTER — Other Ambulatory Visit: Payer: Self-pay

## 2020-07-15 DIAGNOSIS — W19XXXA Unspecified fall, initial encounter: Secondary | ICD-10-CM

## 2020-07-15 DIAGNOSIS — N390 Urinary tract infection, site not specified: Secondary | ICD-10-CM

## 2020-07-15 DIAGNOSIS — Z743 Need for continuous supervision: Secondary | ICD-10-CM | POA: Diagnosis not present

## 2020-07-15 DIAGNOSIS — I499 Cardiac arrhythmia, unspecified: Secondary | ICD-10-CM | POA: Diagnosis not present

## 2020-07-15 DIAGNOSIS — W06XXXA Fall from bed, initial encounter: Secondary | ICD-10-CM | POA: Diagnosis not present

## 2020-07-15 DIAGNOSIS — R319 Hematuria, unspecified: Secondary | ICD-10-CM | POA: Diagnosis not present

## 2020-07-15 DIAGNOSIS — E1165 Type 2 diabetes mellitus with hyperglycemia: Secondary | ICD-10-CM | POA: Diagnosis not present

## 2020-07-15 DIAGNOSIS — Z7901 Long term (current) use of anticoagulants: Secondary | ICD-10-CM | POA: Diagnosis not present

## 2020-07-15 DIAGNOSIS — R9431 Abnormal electrocardiogram [ECG] [EKG]: Secondary | ICD-10-CM | POA: Diagnosis not present

## 2020-07-15 DIAGNOSIS — S0990XA Unspecified injury of head, initial encounter: Secondary | ICD-10-CM | POA: Insufficient documentation

## 2020-07-15 DIAGNOSIS — Y92129 Unspecified place in nursing home as the place of occurrence of the external cause: Secondary | ICD-10-CM | POA: Insufficient documentation

## 2020-07-15 DIAGNOSIS — Z043 Encounter for examination and observation following other accident: Secondary | ICD-10-CM | POA: Diagnosis not present

## 2020-07-15 DIAGNOSIS — R8271 Bacteriuria: Secondary | ICD-10-CM | POA: Diagnosis not present

## 2020-07-15 DIAGNOSIS — R739 Hyperglycemia, unspecified: Secondary | ICD-10-CM | POA: Diagnosis not present

## 2020-07-15 DIAGNOSIS — R404 Transient alteration of awareness: Secondary | ICD-10-CM | POA: Diagnosis not present

## 2020-07-15 DIAGNOSIS — S199XXA Unspecified injury of neck, initial encounter: Secondary | ICD-10-CM | POA: Diagnosis not present

## 2020-07-15 DIAGNOSIS — R6889 Other general symptoms and signs: Secondary | ICD-10-CM | POA: Diagnosis not present

## 2020-07-15 LAB — CBC
HCT: 45.2 % (ref 36.0–46.0)
Hemoglobin: 14 g/dL (ref 12.0–15.0)
MCH: 24.9 pg — ABNORMAL LOW (ref 26.0–34.0)
MCHC: 31 g/dL (ref 30.0–36.0)
MCV: 80.4 fL (ref 80.0–100.0)
Platelets: 401 10*3/uL — ABNORMAL HIGH (ref 150–400)
RBC: 5.62 MIL/uL — ABNORMAL HIGH (ref 3.87–5.11)
RDW: 16.6 % — ABNORMAL HIGH (ref 11.5–15.5)
WBC: 10.5 10*3/uL (ref 4.0–10.5)
nRBC: 0 % (ref 0.0–0.2)

## 2020-07-15 LAB — URINALYSIS, ROUTINE W REFLEX MICROSCOPIC
Bilirubin Urine: NEGATIVE
Glucose, UA: 150 mg/dL — AB
Ketones, ur: NEGATIVE mg/dL
Nitrite: NEGATIVE
Protein, ur: NEGATIVE mg/dL
Specific Gravity, Urine: 1.006 (ref 1.005–1.030)
pH: 6 (ref 5.0–8.0)

## 2020-07-15 LAB — I-STAT CHEM 8, ED
BUN: 16 mg/dL (ref 8–23)
Calcium, Ion: 1.22 mmol/L (ref 1.15–1.40)
Chloride: 99 mmol/L (ref 98–111)
Creatinine, Ser: 1 mg/dL (ref 0.44–1.00)
Glucose, Bld: 277 mg/dL — ABNORMAL HIGH (ref 70–99)
HCT: 42 % (ref 36.0–46.0)
Hemoglobin: 14.3 g/dL (ref 12.0–15.0)
Potassium: 3.9 mmol/L (ref 3.5–5.1)
Sodium: 138 mmol/L (ref 135–145)
TCO2: 26 mmol/L (ref 22–32)

## 2020-07-15 LAB — LACTIC ACID, PLASMA: Lactic Acid, Venous: 2.3 mmol/L (ref 0.5–1.9)

## 2020-07-15 LAB — COMPREHENSIVE METABOLIC PANEL
ALT: 18 U/L (ref 0–44)
AST: 21 U/L (ref 15–41)
Albumin: 3.3 g/dL — ABNORMAL LOW (ref 3.5–5.0)
Alkaline Phosphatase: 149 U/L — ABNORMAL HIGH (ref 38–126)
Anion gap: 9 (ref 5–15)
BUN: 14 mg/dL (ref 8–23)
CO2: 28 mmol/L (ref 22–32)
Calcium: 9.3 mg/dL (ref 8.9–10.3)
Chloride: 98 mmol/L (ref 98–111)
Creatinine, Ser: 1.11 mg/dL — ABNORMAL HIGH (ref 0.44–1.00)
GFR, Estimated: 49 mL/min — ABNORMAL LOW (ref >60)
Glucose, Bld: 280 mg/dL — ABNORMAL HIGH (ref 70–99)
Potassium: 3.8 mmol/L (ref 3.5–5.1)
Sodium: 135 mmol/L (ref 135–145)
Total Bilirubin: 0.7 mg/dL (ref 0.3–1.2)
Total Protein: 7.5 g/dL (ref 6.5–8.1)

## 2020-07-15 LAB — CBG MONITORING, ED: Glucose-Capillary: 254 mg/dL — ABNORMAL HIGH (ref 70–99)

## 2020-07-15 LAB — PROTIME-INR
INR: 1.1 (ref 0.8–1.2)
Prothrombin Time: 14.2 seconds (ref 11.4–15.2)

## 2020-07-15 LAB — CK: Total CK: 93 U/L (ref 38–234)

## 2020-07-15 LAB — ETHANOL: Alcohol, Ethyl (B): <10 mg/dL (ref <10)

## 2020-07-15 MED ORDER — SODIUM CHLORIDE 0.9 % IV BOLUS
1000.0000 mL | Freq: Once | INTRAVENOUS | Status: AC
  Administered 2020-07-15: 1000 mL via INTRAVENOUS

## 2020-07-15 MED ORDER — SODIUM CHLORIDE 0.9 % IV SOLN: INTRAVENOUS | Status: DC

## 2020-07-15 MED ORDER — CEPHALEXIN 250 MG PO CAPS: 250.0000 mg | ORAL_CAPSULE | Freq: Four times a day (QID) | ORAL | 0 refills | Status: AC

## 2020-07-15 MED ORDER — CEPHALEXIN 250 MG PO CAPS
500.0000 mg | ORAL_CAPSULE | Freq: Once | ORAL | Status: AC
  Administered 2020-07-15: 500 mg via ORAL
  Filled 2020-07-15: qty 2

## 2020-07-15 NOTE — ED Notes (Signed)
Pt was cleaned up after having a bowel movement and urinating.  New brief was placed and pt was changed back into shirt from home.

## 2020-07-15 NOTE — ED Notes (Signed)
Casey Harrell was called & report was given for pt to return.

## 2020-07-15 NOTE — ED Triage Notes (Signed)
Patient arrives with [facility] from assisted living facility, [facility] [place], s/p fall. Patient is on Eliquis, unknown LOC, unsure how long she was on the ground, found in stool and urine, patient altered per EMS. Patient denies any pain or injuries.  EMS vitals 170/100 76 HR 96% RA CBG 400

## 2020-07-15 NOTE — ED Provider Notes (Signed)
[facility] EMERGENCY DEPARTMENT Provider Note  CSN: [id number] Arrival date & time: 07/15/20 0459  Chief Complaint(s) Level 2 fall  HPI Casey Harrell is a 86 y.o. female presents by EMS as a level 2 trauma after being found down at her independent living facility room.  It appeared that patient got up in the middle of the night and attempted to go to the restroom.  Per EMS, they report that it looked like the patient has slipped out of bed.  She was found on the floor in her own feces and urine.  Some of the feces had already dried.  She was found by facility staff after she was heard by a neighbor crying out for help.  At baseline patient has short-term memory deficits confirmed by her son, whom which I spoke to.   She denies any headache or neck pain.  No chest pain or back pain.  No abdominal pain.  No nausea or vomiting.  No physical complaints.  HPI  Past Medical History No past medical history on file. There are no problems to display for this patient.  Home Medication(s) Prior to Admission medications   Not on File                                                                                                                                    Past Surgical History ** The histories are not reviewed yet. Please review them in the "History" navigator section and refresh this SmartLink. Family History No family history on file.  Social History   Allergies Patient has no allergy information on record.  Review of Systems Review of Systems All other systems are reviewed and are negative for acute change except as noted in the HPI  Physical Exam Vital Signs  I have reviewed the triage vital signs BP 189/99   Pulse 83   Temp 98.1 F (36.7 C) (Oral)   Resp (!) 22   Ht 5\' 2"  (1.575 m)   Wt 61.2 kg   SpO2 99%   BMI 24.68 kg/m   Physical Exam Vitals reviewed.  Constitutional:      General: She is not in acute distress.    Appearance: She is  well-developed. She is not diaphoretic.  HENT:     Head: Normocephalic and atraumatic.     Right Ear: External ear normal.     Left Ear: External ear normal.     Nose: Nose normal.  Eyes:     General: No scleral icterus.    Conjunctiva/sclera: Conjunctivae normal.  Neck:     Trachea: Phonation normal.  Cardiovascular:     Rate and Rhythm: Normal rate and regular rhythm.  Pulmonary:     Effort: Pulmonary effort is normal. No respiratory distress.     Breath sounds: No stridor.  Abdominal:     General: There is no distension.  Musculoskeletal:  General: Normal range of motion.     Cervical back: Normal range of motion.  Neurological:     Mental Status: She is alert.     Comments: Oriented to person and place. Not time. Moves all extremities  Psychiatric:        Behavior: Behavior normal.     ED Results and Treatments Labs (all labs ordered are listed, but only abnormal results are displayed) Labs Reviewed  COMPREHENSIVE METABOLIC PANEL - Abnormal; Notable for the following components:      Result Value   Glucose, Bld 280 (*)    Creatinine, Ser 1.11 (*)    Albumin 3.3 (*)    Alkaline Phosphatase 149 (*)    GFR, Estimated 49 (*)    All other components within normal limits  CBC - Abnormal; Notable for the following components:   RBC 5.62 (*)    MCH 24.9 (*)    RDW 16.6 (*)    Platelets 401 (*)    All other components within normal limits  I-STAT CHEM 8, ED - Abnormal; Notable for the following components:   Glucose, Bld 277 (*)    All other components within normal limits  ETHANOL  PROTIME-INR  CK  URINALYSIS, ROUTINE W REFLEX MICROSCOPIC  LACTIC ACID, PLASMA                                                                                                                         EKG  EKG Interpretation  Date/Time:  Thursday July 15 2020 05:00:54 EST Ventricular Rate:  72 PR Interval:    QRS Duration: 75 QT Interval:  424 QTC Calculation: 464 R  Axis:   0 Text Interpretation: Sinus rhythm Low voltage, precordial leads Probable anteroseptal infarct, old No old tracing to compare Confirmed by Drema Pryardama, Gicela Schwarting [phone]([id number]) on 07/15/2020 5:53:17 AM      Radiology CT HEAD WO CONTRAST  Result Date: 07/15/2020 CLINICAL DATA:  Head trauma, neck trauma EXAM: CT HEAD WITHOUT CONTRAST CT CERVICAL SPINE WITHOUT CONTRAST TECHNIQUE: Multidetector CT imaging of the head and cervical spine was performed following the standard protocol without intravenous contrast. Multiplanar CT image reconstructions of the cervical spine were also generated. COMPARISON:  04/28/2020 FINDINGS: CT HEAD FINDINGS Brain: Normal anatomic configuration. Parenchymal volume loss is commensurate with the patient's age. Moderate subcortical and periventricular white matter changes are present likely reflecting the sequela of small vessel ischemia. 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 Remote lacunar infarct noted within the a left caudate nucleus and periventricular white matter as well as the cerebellar hemispheres bilaterally. No abnormal intra or extra-axial mass lesion or fluid collection. No abnormal mass effect or midline shift. No evidence of acute intracranial hemorrhage or infarct. Ventricular size is normal. Cerebellum unremarkable. Vascular: There is asymmetric hyperdensity within the right middle cerebral artery, however, this appears similar to that noted on prior examination in this likely  represents the sequela of intracranial atherosclerotic disease or artifact related to in plane imaging. Skull: Intact Sinuses/Orbits: The root of the left posterior maxillary molar protrudes into the left maxillary sinus and there is associated mucous retention cyst within the sinus. Remaining paranasal sinuses are clear. The orbits are unremarkable. Other: Mastoid air  cells and middle ear cavities are clear. CT CERVICAL SPINE FINDINGS Alignment: Normal. Skull base and vertebrae: No acute fracture. No primary bone lesion or focal pathologic process. Soft tissues and spinal canal: No prevertebral fluid or swelling. No visible canal hematoma. There is mild central canal stenosis at C5-6 secondary to a posterior disc osteophyte complex resulting in an AP diameter of the canal of approximately 8 mm. This abuts, but does not remodel the thecal sac. The spinal canal is otherwise widely patent. Disc levels: There is intervertebral disc space narrowing and endplate remodeling at C5-6 and C6-7 with small posteriorly oriented disc osteophytes at these levels. Congenital shortening of the pedicles in combination with asymmetric uncovertebral and facet arthrosis results in moderate to severe left neuroforaminal narrowing at C4-5, and right neuroforaminal narrowing at C5-6., Upper chest: Unremarkable Other: None IMPRESSION: No acute intracranial abnormality.  No calvarial fracture. Extensive senescent change and multiple remote infarcts as described above. No acute fracture or listhesis of the cervical spine. Degenerative disc and degenerative joint disease of the cervical spine resulting in mild central canal stenosis at C5-6 and multilevel neuroforaminal narrowing as described above. Electronically Signed   By: Helyn Numbers MD   On: 07/15/2020 05:44   CT CERVICAL SPINE WO CONTRAST  Result Date: 07/15/2020 CLINICAL DATA:  Head trauma, neck trauma EXAM: CT HEAD WITHOUT CONTRAST CT CERVICAL SPINE WITHOUT CONTRAST TECHNIQUE: Multidetector CT imaging of the head and cervical spine was performed following the standard protocol without intravenous contrast. Multiplanar CT image reconstructions of the cervical spine were also generated. COMPARISON:  04/28/2020 FINDINGS: CT HEAD FINDINGS Brain: Normal anatomic configuration. Parenchymal volume loss is commensurate with the patient's age.  Moderate subcortical and periventricular white matter changes are present likely reflecting the sequela of small vessel ischemia. 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 Remote lacunar infarct noted within the a left caudate nucleus and periventricular white matter as well as the cerebellar hemispheres bilaterally. No abnormal intra or extra-axial mass lesion or fluid collection. No abnormal mass effect or midline shift. No evidence of acute intracranial hemorrhage or infarct. Ventricular size is normal. Cerebellum unremarkable. Vascular: There is asymmetric hyperdensity within the right middle cerebral artery, however, this appears similar to that noted on prior examination in this likely represents the sequela of intracranial atherosclerotic disease or artifact related to in plane imaging. Skull: Intact Sinuses/Orbits: The root of the left posterior maxillary molar protrudes into the left maxillary sinus and there is associated mucous retention cyst within the sinus. Remaining paranasal sinuses are clear. The orbits are unremarkable. Other: Mastoid air cells and middle ear cavities are clear. CT CERVICAL SPINE FINDINGS Alignment: Normal. Skull base and vertebrae: No acute fracture. No primary bone lesion or focal pathologic process. Soft tissues and spinal canal: No prevertebral fluid or swelling. No visible canal hematoma. There is mild central canal stenosis at C5-6 secondary to a posterior disc osteophyte complex resulting in an AP diameter of the canal of approximately 8 mm. This abuts, but does not remodel the thecal sac. The spinal canal is otherwise widely patent. Disc levels: There  is intervertebral disc space narrowing and endplate remodeling at C5-6 and C6-7 with small posteriorly oriented disc osteophytes at these levels. Congenital shortening of the pedicles in combination  with asymmetric uncovertebral and facet arthrosis results in moderate to severe left neuroforaminal narrowing at C4-5, and right neuroforaminal narrowing at C5-6., Upper chest: Unremarkable Other: None IMPRESSION: No acute intracranial abnormality.  No calvarial fracture. Extensive senescent change and multiple remote infarcts as described above. No acute fracture or listhesis of the cervical spine. Degenerative disc and degenerative joint disease of the cervical spine resulting in mild central canal stenosis at C5-6 and multilevel neuroforaminal narrowing as described above. Electronically Signed   By: Helyn Numbers MD   On: 07/15/2020 05:44   DG Pelvis Portable  Result Date: 07/15/2020 CLINICAL DATA:  Status post fall. EXAM: PORTABLE PELVIS 1-2 VIEWS COMPARISON:  07/15/2020. FINDINGS: Diffuse osteopenia. Degenerative changes lumbar spine and both hips. No acute bony abnormality identified. Pelvic calcifications consistent phleboliths. Metallic density noted scratched it tiny metallic density noted pelvis. Aortoiliac and peripheral vascular calcification. IMPRESSION: Diffuse osteopenia. Degenerative changes lumbar spine and both hips. No acute abnormality identified. Electronically Signed   By: Maisie Fus  Register   On: 07/15/2020 05:16   DG Chest Port 1 View  Result Date: 07/15/2020 CLINICAL DATA:  Fall, chronic anticoagulation, altered mental status EXAM: PORTABLE CHEST 1 VIEW COMPARISON:  04/28/2020 FINDINGS: Lungs volumes are small, but are symmetric and are clear. No pneumothorax or pleural effusion. Cardiac size within normal limits. Pulmonary vascularity is normal. Osseous structures are age-appropriate. No acute bone abnormality. IMPRESSION: No active disease. Electronically Signed   By: Helyn Numbers MD   On: 07/15/2020 05:18    Pertinent labs & imaging results that were available during my care of the patient were reviewed by me and considered in my medical decision making (see chart for  details).  Medications Ordered in ED Medications  sodium chloride 0.9 % bolus 1,000 mL (1,000 mLs Intravenous Bolus 07/15/20 0553)    And  0.9 %  sodium chloride infusion (has no administration in time range)                                                                                                                                    Procedures Procedures  (including critical care time)  Medical Decision Making / ED Course I have reviewed the nursing notes for this encounter and the patient's prior records (if available in EHR or on provided paperwork).   ROBI MITTER was evaluated in Emergency Department on 07/15/2020 for the symptoms described in the history of present illness. She was evaluated in the context of the global COVID-19 pandemic, which necessitated consideration that the patient might be at risk for infection with the SARS-CoV-2 virus that causes COVID-19. Institutional protocols and algorithms that pertain to the evaluation of patients at risk for COVID-19 are in a state of rapid change based on information  released by regulatory bodies including the CDC and federal and state organizations. These policies and algorithms were followed during the patient's care in the ED.  No obvious signs of trauma on exam. ABCs intact. Secondary as above. CT head and cervical spine negative. Chest and pelvis plain film negative.  Labs grossly reassuring.  Without significant electrolyte derangement or renal sufficiency.  Patient does have hyperglycemia without evidence of DKA.  No leukocytosis or anemia.  Currently awaiting UA to assess for infection.      Final Clinical Impression(s) / ED Diagnoses Final diagnoses:  Fall  Fall      This chart was dictated using voice recognition software.  Despite best efforts to proofread,  errors can occur which can change the documentation meaning.   Nira Conn, MD 07/15/20 [phone]

## 2020-07-15 NOTE — Discharge Instructions (Addendum)
You have been seen and discharged from the emergency department.  You were found to have a urinary tract infection, take antibiotics as directed.  Follow-up with your primary provider for reevaluation. Take home medications as prescribed. If you have any worsening symptoms or further concerns for health please return to an emergency department for further evaluation.

## 2020-07-15 NOTE — Progress Notes (Signed)
Orthopedic Tech Progress Note Patient Details:  Casey Harrell 1934/09/11 [id number] Level 2 Trauma  Patient ID: Atilano Median, female   DOB: 27-Jun-1934, 86 y.o.   MRN: [id number]   Smitty Pluck 07/15/2020, 5:01 AM

## 2020-07-15 NOTE — ED Provider Notes (Addendum)
Patient signed out by overnight doctor.  Please refer to his note for full H&P.  Briefly this is an 86 year old female who was found down at her independent living facility after trying to go to the restroom in the middle the night.  CT and x-ray imaging were negative, labs are grossly reassuring, we were pending UA for evaluation of infection. Plan for DC. Physical Exam  BP (!) 159/85   Pulse 96   Temp 98.1 F (36.7 C) (Oral)   Resp 20   Ht 5\' 2"  (1.575 m)   Wt 61.2 kg   SpO2 91%   BMI 24.68 kg/m   Physical Exam Vitals and nursing note reviewed.  Constitutional:      Appearance: Normal appearance.  HENT:     Head: Normocephalic.     Mouth/Throat:     Mouth: Mucous membranes are moist.  Cardiovascular:     Rate and Rhythm: Normal rate.  Pulmonary:     Effort: Pulmonary effort is normal. No respiratory distress.  Abdominal:     Palpations: Abdomen is soft.     Tenderness: There is no abdominal tenderness.  Skin:    General: Skin is warm.  Neurological:     Mental Status: She is alert. Mental status is at baseline.  Psychiatric:        Mood and Affect: Mood normal.     ED Course/Procedures     Procedures  MDM  Urinalysis is LE positive with bacteria.  We will plan to treat and send the urine for culture. Patient appears well and is reported to be baseline. Agree with plan for outpatient follow-up.  Patient will be discharged and treated as an outpatient.  Discharge plan and strict return to ED precautions discussed, patient verbalizes understanding and agreement.       , DO 07/15/20 1043    09/14/20, DO 07/15/20 1045

## 2020-07-15 NOTE — ED Notes (Signed)
Son was called and given a update about pt & he is leaving work to pick her up & he should be here in approx. 1 hr to transport her back to her residence himself.

## 2020-07-16 ENCOUNTER — Encounter (HOSPITAL_COMMUNITY): Payer: Self-pay

## 2020-07-16 DIAGNOSIS — R296 Repeated falls: Secondary | ICD-10-CM | POA: Diagnosis not present

## 2020-07-16 DIAGNOSIS — M6281 Muscle weakness (generalized): Secondary | ICD-10-CM | POA: Diagnosis not present

## 2020-07-16 DIAGNOSIS — R279 Unspecified lack of coordination: Secondary | ICD-10-CM | POA: Diagnosis not present

## 2020-07-19 DIAGNOSIS — R279 Unspecified lack of coordination: Secondary | ICD-10-CM | POA: Diagnosis not present

## 2020-07-19 DIAGNOSIS — R296 Repeated falls: Secondary | ICD-10-CM | POA: Diagnosis not present

## 2020-07-19 DIAGNOSIS — M6281 Muscle weakness (generalized): Secondary | ICD-10-CM | POA: Diagnosis not present

## 2020-07-21 DIAGNOSIS — R279 Unspecified lack of coordination: Secondary | ICD-10-CM | POA: Diagnosis not present

## 2020-07-21 DIAGNOSIS — R296 Repeated falls: Secondary | ICD-10-CM | POA: Diagnosis not present

## 2020-07-21 DIAGNOSIS — M6281 Muscle weakness (generalized): Secondary | ICD-10-CM | POA: Diagnosis not present

## 2020-07-22 DIAGNOSIS — R279 Unspecified lack of coordination: Secondary | ICD-10-CM | POA: Diagnosis not present

## 2020-07-22 DIAGNOSIS — M6281 Muscle weakness (generalized): Secondary | ICD-10-CM | POA: Diagnosis not present

## 2020-07-22 DIAGNOSIS — R296 Repeated falls: Secondary | ICD-10-CM | POA: Diagnosis not present

## 2020-07-25 DIAGNOSIS — E1165 Type 2 diabetes mellitus with hyperglycemia: Secondary | ICD-10-CM | POA: Diagnosis not present

## 2020-08-03 ENCOUNTER — Other Ambulatory Visit: Payer: Self-pay | Admitting: Family Medicine

## 2020-08-06 DIAGNOSIS — Z794 Long term (current) use of insulin: Secondary | ICD-10-CM | POA: Diagnosis not present

## 2020-08-06 DIAGNOSIS — Z7982 Long term (current) use of aspirin: Secondary | ICD-10-CM | POA: Diagnosis not present

## 2020-08-06 DIAGNOSIS — Z91041 Radiographic dye allergy status: Secondary | ICD-10-CM | POA: Diagnosis not present

## 2020-08-06 DIAGNOSIS — I10 Essential (primary) hypertension: Secondary | ICD-10-CM | POA: Diagnosis not present

## 2020-08-06 DIAGNOSIS — R531 Weakness: Secondary | ICD-10-CM | POA: Diagnosis not present

## 2020-08-06 DIAGNOSIS — R451 Restlessness and agitation: Secondary | ICD-10-CM | POA: Diagnosis not present

## 2020-08-06 DIAGNOSIS — Z881 Allergy status to other antibiotic agents status: Secondary | ICD-10-CM | POA: Diagnosis not present

## 2020-08-06 DIAGNOSIS — R404 Transient alteration of awareness: Secondary | ICD-10-CM | POA: Diagnosis not present

## 2020-08-06 DIAGNOSIS — N390 Urinary tract infection, site not specified: Secondary | ICD-10-CM | POA: Diagnosis not present

## 2020-08-06 DIAGNOSIS — R0902 Hypoxemia: Secondary | ICD-10-CM | POA: Diagnosis not present

## 2020-08-06 DIAGNOSIS — Z7901 Long term (current) use of anticoagulants: Secondary | ICD-10-CM | POA: Diagnosis not present

## 2020-08-06 DIAGNOSIS — Z79899 Other long term (current) drug therapy: Secondary | ICD-10-CM | POA: Diagnosis not present

## 2020-08-06 DIAGNOSIS — Z885 Allergy status to narcotic agent status: Secondary | ICD-10-CM | POA: Diagnosis not present

## 2020-08-06 DIAGNOSIS — J811 Chronic pulmonary edema: Secondary | ICD-10-CM | POA: Diagnosis not present

## 2020-08-06 DIAGNOSIS — Z8673 Personal history of transient ischemic attack (TIA), and cerebral infarction without residual deficits: Secondary | ICD-10-CM | POA: Diagnosis not present

## 2020-08-06 DIAGNOSIS — E1165 Type 2 diabetes mellitus with hyperglycemia: Secondary | ICD-10-CM | POA: Diagnosis not present

## 2020-08-07 DIAGNOSIS — R451 Restlessness and agitation: Secondary | ICD-10-CM | POA: Diagnosis not present

## 2020-08-07 DIAGNOSIS — E1165 Type 2 diabetes mellitus with hyperglycemia: Secondary | ICD-10-CM | POA: Diagnosis not present

## 2020-08-07 DIAGNOSIS — N390 Urinary tract infection, site not specified: Secondary | ICD-10-CM | POA: Diagnosis not present

## 2020-08-08 DIAGNOSIS — E1165 Type 2 diabetes mellitus with hyperglycemia: Secondary | ICD-10-CM | POA: Diagnosis not present

## 2020-08-08 DIAGNOSIS — N390 Urinary tract infection, site not specified: Secondary | ICD-10-CM | POA: Diagnosis not present

## 2020-08-08 DIAGNOSIS — R451 Restlessness and agitation: Secondary | ICD-10-CM | POA: Diagnosis not present

## 2020-08-09 DIAGNOSIS — R0902 Hypoxemia: Secondary | ICD-10-CM | POA: Diagnosis not present

## 2020-08-09 DIAGNOSIS — Z743 Need for continuous supervision: Secondary | ICD-10-CM | POA: Diagnosis not present

## 2020-08-09 DIAGNOSIS — M79621 Pain in right upper arm: Secondary | ICD-10-CM | POA: Diagnosis not present

## 2020-08-09 DIAGNOSIS — E119 Type 2 diabetes mellitus without complications: Secondary | ICD-10-CM | POA: Diagnosis not present

## 2020-08-09 DIAGNOSIS — M79601 Pain in right arm: Secondary | ICD-10-CM | POA: Diagnosis not present

## 2020-08-09 DIAGNOSIS — R011 Cardiac murmur, unspecified: Secondary | ICD-10-CM | POA: Diagnosis not present

## 2020-08-09 DIAGNOSIS — R6889 Other general symptoms and signs: Secondary | ICD-10-CM | POA: Diagnosis not present

## 2020-08-09 DIAGNOSIS — W19XXXA Unspecified fall, initial encounter: Secondary | ICD-10-CM | POA: Diagnosis not present

## 2020-08-10 DIAGNOSIS — Z049 Encounter for examination and observation for unspecified reason: Secondary | ICD-10-CM | POA: Diagnosis not present

## 2020-08-13 DIAGNOSIS — J449 Chronic obstructive pulmonary disease, unspecified: Secondary | ICD-10-CM | POA: Diagnosis not present

## 2020-08-13 DIAGNOSIS — I482 Chronic atrial fibrillation, unspecified: Secondary | ICD-10-CM | POA: Diagnosis not present

## 2020-08-13 DIAGNOSIS — M25521 Pain in right elbow: Secondary | ICD-10-CM | POA: Diagnosis not present

## 2020-08-13 DIAGNOSIS — M79622 Pain in left upper arm: Secondary | ICD-10-CM | POA: Diagnosis not present

## 2020-08-13 DIAGNOSIS — E1165 Type 2 diabetes mellitus with hyperglycemia: Secondary | ICD-10-CM | POA: Diagnosis not present

## 2020-08-13 DIAGNOSIS — I10 Essential (primary) hypertension: Secondary | ICD-10-CM | POA: Diagnosis not present

## 2020-08-13 DIAGNOSIS — M25511 Pain in right shoulder: Secondary | ICD-10-CM | POA: Diagnosis not present

## 2020-08-14 DIAGNOSIS — R0902 Hypoxemia: Secondary | ICD-10-CM | POA: Diagnosis not present

## 2020-08-14 DIAGNOSIS — R600 Localized edema: Secondary | ICD-10-CM | POA: Diagnosis not present

## 2020-08-14 DIAGNOSIS — Z79899 Other long term (current) drug therapy: Secondary | ICD-10-CM | POA: Diagnosis not present

## 2020-08-14 DIAGNOSIS — R404 Transient alteration of awareness: Secondary | ICD-10-CM | POA: Diagnosis not present

## 2020-08-14 DIAGNOSIS — Z881 Allergy status to other antibiotic agents status: Secondary | ICD-10-CM | POA: Diagnosis not present

## 2020-08-14 DIAGNOSIS — G319 Degenerative disease of nervous system, unspecified: Secondary | ICD-10-CM | POA: Diagnosis not present

## 2020-08-14 DIAGNOSIS — I480 Paroxysmal atrial fibrillation: Secondary | ICD-10-CM | POA: Diagnosis not present

## 2020-08-14 DIAGNOSIS — Z794 Long term (current) use of insulin: Secondary | ICD-10-CM | POA: Diagnosis not present

## 2020-08-14 DIAGNOSIS — R410 Disorientation, unspecified: Secondary | ICD-10-CM | POA: Diagnosis not present

## 2020-08-14 DIAGNOSIS — R4781 Slurred speech: Secondary | ICD-10-CM | POA: Diagnosis not present

## 2020-08-14 DIAGNOSIS — S4991XA Unspecified injury of right shoulder and upper arm, initial encounter: Secondary | ICD-10-CM | POA: Diagnosis not present

## 2020-08-14 DIAGNOSIS — L89302 Pressure ulcer of unspecified buttock, stage 2: Secondary | ICD-10-CM | POA: Diagnosis not present

## 2020-08-14 DIAGNOSIS — R531 Weakness: Secondary | ICD-10-CM | POA: Diagnosis not present

## 2020-08-14 DIAGNOSIS — S52134A Nondisplaced fracture of neck of right radius, initial encounter for closed fracture: Secondary | ICD-10-CM | POA: Diagnosis not present

## 2020-08-14 DIAGNOSIS — Z91041 Radiographic dye allergy status: Secondary | ICD-10-CM | POA: Diagnosis not present

## 2020-08-14 DIAGNOSIS — E1165 Type 2 diabetes mellitus with hyperglycemia: Secondary | ICD-10-CM | POA: Diagnosis not present

## 2020-08-14 DIAGNOSIS — Z885 Allergy status to narcotic agent status: Secondary | ICD-10-CM | POA: Diagnosis not present

## 2020-08-14 DIAGNOSIS — Z7982 Long term (current) use of aspirin: Secondary | ICD-10-CM | POA: Diagnosis not present

## 2020-08-14 DIAGNOSIS — Z7901 Long term (current) use of anticoagulants: Secondary | ICD-10-CM | POA: Diagnosis not present

## 2020-08-14 DIAGNOSIS — D509 Iron deficiency anemia, unspecified: Secondary | ICD-10-CM | POA: Diagnosis not present

## 2020-08-14 DIAGNOSIS — Z8673 Personal history of transient ischemic attack (TIA), and cerebral infarction without residual deficits: Secondary | ICD-10-CM | POA: Diagnosis not present

## 2020-08-14 DIAGNOSIS — I10 Essential (primary) hypertension: Secondary | ICD-10-CM | POA: Diagnosis not present

## 2020-08-14 DIAGNOSIS — K921 Melena: Secondary | ICD-10-CM | POA: Diagnosis not present

## 2020-08-14 DIAGNOSIS — S6991XA Unspecified injury of right wrist, hand and finger(s), initial encounter: Secondary | ICD-10-CM | POA: Diagnosis not present

## 2020-08-14 DIAGNOSIS — S52131A Displaced fracture of neck of right radius, initial encounter for closed fracture: Secondary | ICD-10-CM | POA: Diagnosis not present

## 2020-08-14 DIAGNOSIS — G9341 Metabolic encephalopathy: Secondary | ICD-10-CM | POA: Diagnosis not present

## 2020-08-14 DIAGNOSIS — R9082 White matter disease, unspecified: Secondary | ICD-10-CM | POA: Diagnosis not present

## 2020-08-14 DIAGNOSIS — D649 Anemia, unspecified: Secondary | ICD-10-CM | POA: Diagnosis not present

## 2020-08-15 DIAGNOSIS — I10 Essential (primary) hypertension: Secondary | ICD-10-CM | POA: Diagnosis not present

## 2020-08-15 DIAGNOSIS — S52134A Nondisplaced fracture of neck of right radius, initial encounter for closed fracture: Secondary | ICD-10-CM | POA: Diagnosis not present

## 2020-08-15 DIAGNOSIS — E1165 Type 2 diabetes mellitus with hyperglycemia: Secondary | ICD-10-CM | POA: Diagnosis not present

## 2020-08-15 DIAGNOSIS — I480 Paroxysmal atrial fibrillation: Secondary | ICD-10-CM | POA: Diagnosis not present

## 2020-08-15 DIAGNOSIS — Z8673 Personal history of transient ischemic attack (TIA), and cerebral infarction without residual deficits: Secondary | ICD-10-CM | POA: Diagnosis not present

## 2020-08-15 DIAGNOSIS — S4991XA Unspecified injury of right shoulder and upper arm, initial encounter: Secondary | ICD-10-CM | POA: Diagnosis not present

## 2020-08-15 DIAGNOSIS — R600 Localized edema: Secondary | ICD-10-CM | POA: Diagnosis not present

## 2020-08-15 DIAGNOSIS — S6991XA Unspecified injury of right wrist, hand and finger(s), initial encounter: Secondary | ICD-10-CM | POA: Diagnosis not present

## 2020-08-15 DIAGNOSIS — D649 Anemia, unspecified: Secondary | ICD-10-CM | POA: Diagnosis not present

## 2020-08-15 DIAGNOSIS — Z794 Long term (current) use of insulin: Secondary | ICD-10-CM | POA: Diagnosis not present

## 2020-08-16 DIAGNOSIS — E1165 Type 2 diabetes mellitus with hyperglycemia: Secondary | ICD-10-CM | POA: Diagnosis not present

## 2020-08-16 DIAGNOSIS — Z8673 Personal history of transient ischemic attack (TIA), and cerebral infarction without residual deficits: Secondary | ICD-10-CM | POA: Diagnosis not present

## 2020-08-16 DIAGNOSIS — Z794 Long term (current) use of insulin: Secondary | ICD-10-CM | POA: Diagnosis not present

## 2020-08-16 DIAGNOSIS — D649 Anemia, unspecified: Secondary | ICD-10-CM | POA: Diagnosis not present

## 2020-08-16 DIAGNOSIS — I480 Paroxysmal atrial fibrillation: Secondary | ICD-10-CM | POA: Diagnosis not present

## 2020-08-16 DIAGNOSIS — I10 Essential (primary) hypertension: Secondary | ICD-10-CM | POA: Diagnosis not present

## 2020-08-16 DIAGNOSIS — S52134A Nondisplaced fracture of neck of right radius, initial encounter for closed fracture: Secondary | ICD-10-CM | POA: Diagnosis not present

## 2020-08-17 DIAGNOSIS — I10 Essential (primary) hypertension: Secondary | ICD-10-CM | POA: Diagnosis not present

## 2020-08-17 DIAGNOSIS — E872 Acidosis: Secondary | ICD-10-CM | POA: Diagnosis not present

## 2020-08-17 DIAGNOSIS — Z743 Need for continuous supervision: Secondary | ICD-10-CM | POA: Diagnosis not present

## 2020-08-17 DIAGNOSIS — E785 Hyperlipidemia, unspecified: Secondary | ICD-10-CM | POA: Diagnosis not present

## 2020-08-17 DIAGNOSIS — Z881 Allergy status to other antibiotic agents status: Secondary | ICD-10-CM | POA: Diagnosis not present

## 2020-08-17 DIAGNOSIS — E1165 Type 2 diabetes mellitus with hyperglycemia: Secondary | ICD-10-CM | POA: Diagnosis not present

## 2020-08-17 DIAGNOSIS — S0990XA Unspecified injury of head, initial encounter: Secondary | ICD-10-CM | POA: Diagnosis not present

## 2020-08-17 DIAGNOSIS — Z79899 Other long term (current) drug therapy: Secondary | ICD-10-CM | POA: Diagnosis not present

## 2020-08-17 DIAGNOSIS — R296 Repeated falls: Secondary | ICD-10-CM | POA: Diagnosis not present

## 2020-08-17 DIAGNOSIS — S199XXA Unspecified injury of neck, initial encounter: Secondary | ICD-10-CM | POA: Diagnosis not present

## 2020-08-17 DIAGNOSIS — R2689 Other abnormalities of gait and mobility: Secondary | ICD-10-CM | POA: Diagnosis not present

## 2020-08-17 DIAGNOSIS — E871 Hypo-osmolality and hyponatremia: Secondary | ICD-10-CM | POA: Diagnosis not present

## 2020-08-17 DIAGNOSIS — Z7901 Long term (current) use of anticoagulants: Secondary | ICD-10-CM | POA: Diagnosis not present

## 2020-08-17 DIAGNOSIS — R9431 Abnormal electrocardiogram [ECG] [EKG]: Secondary | ICD-10-CM | POA: Diagnosis not present

## 2020-08-17 DIAGNOSIS — Z888 Allergy status to other drugs, medicaments and biological substances status: Secondary | ICD-10-CM | POA: Diagnosis not present

## 2020-08-17 DIAGNOSIS — R402411 Glasgow coma scale score 13-15, in the field [EMT or ambulance]: Secondary | ICD-10-CM | POA: Diagnosis not present

## 2020-08-17 DIAGNOSIS — I480 Paroxysmal atrial fibrillation: Secondary | ICD-10-CM | POA: Diagnosis not present

## 2020-08-17 DIAGNOSIS — Z885 Allergy status to narcotic agent status: Secondary | ICD-10-CM | POA: Diagnosis not present

## 2020-08-17 DIAGNOSIS — R6889 Other general symptoms and signs: Secondary | ICD-10-CM | POA: Diagnosis not present

## 2020-08-17 DIAGNOSIS — S0003XA Contusion of scalp, initial encounter: Secondary | ICD-10-CM | POA: Diagnosis not present

## 2020-08-17 DIAGNOSIS — E039 Hypothyroidism, unspecified: Secondary | ICD-10-CM | POA: Diagnosis not present

## 2020-08-17 DIAGNOSIS — Z9181 History of falling: Secondary | ICD-10-CM | POA: Diagnosis not present

## 2020-08-17 DIAGNOSIS — Z8673 Personal history of transient ischemic attack (TIA), and cerebral infarction without residual deficits: Secondary | ICD-10-CM | POA: Diagnosis not present

## 2020-08-17 DIAGNOSIS — S52131A Displaced fracture of neck of right radius, initial encounter for closed fracture: Secondary | ICD-10-CM | POA: Diagnosis not present

## 2020-08-17 DIAGNOSIS — R404 Transient alteration of awareness: Secondary | ICD-10-CM | POA: Diagnosis not present

## 2020-08-17 DIAGNOSIS — Z7982 Long term (current) use of aspirin: Secondary | ICD-10-CM | POA: Diagnosis not present

## 2020-08-17 DIAGNOSIS — D509 Iron deficiency anemia, unspecified: Secondary | ICD-10-CM | POA: Diagnosis not present

## 2020-08-17 DIAGNOSIS — Z794 Long term (current) use of insulin: Secondary | ICD-10-CM | POA: Diagnosis not present

## 2020-08-18 DIAGNOSIS — E871 Hypo-osmolality and hyponatremia: Secondary | ICD-10-CM | POA: Diagnosis not present

## 2020-08-18 DIAGNOSIS — I10 Essential (primary) hypertension: Secondary | ICD-10-CM | POA: Diagnosis not present

## 2020-08-18 DIAGNOSIS — I480 Paroxysmal atrial fibrillation: Secondary | ICD-10-CM | POA: Diagnosis not present

## 2020-08-18 DIAGNOSIS — W19XXXA Unspecified fall, initial encounter: Secondary | ICD-10-CM | POA: Diagnosis not present

## 2020-08-18 DIAGNOSIS — E1165 Type 2 diabetes mellitus with hyperglycemia: Secondary | ICD-10-CM | POA: Diagnosis not present

## 2020-08-18 DIAGNOSIS — Z8673 Personal history of transient ischemic attack (TIA), and cerebral infarction without residual deficits: Secondary | ICD-10-CM | POA: Diagnosis not present

## 2020-08-18 DIAGNOSIS — S0003XA Contusion of scalp, initial encounter: Secondary | ICD-10-CM | POA: Diagnosis not present

## 2020-08-19 DIAGNOSIS — S0003XA Contusion of scalp, initial encounter: Secondary | ICD-10-CM | POA: Diagnosis not present

## 2020-08-19 DIAGNOSIS — W19XXXA Unspecified fall, initial encounter: Secondary | ICD-10-CM | POA: Diagnosis not present

## 2020-08-19 DIAGNOSIS — E871 Hypo-osmolality and hyponatremia: Secondary | ICD-10-CM | POA: Diagnosis not present

## 2020-08-19 DIAGNOSIS — E1165 Type 2 diabetes mellitus with hyperglycemia: Secondary | ICD-10-CM | POA: Diagnosis not present

## 2020-08-19 DIAGNOSIS — I10 Essential (primary) hypertension: Secondary | ICD-10-CM | POA: Diagnosis not present

## 2020-08-19 DIAGNOSIS — I480 Paroxysmal atrial fibrillation: Secondary | ICD-10-CM | POA: Diagnosis not present

## 2020-08-19 DIAGNOSIS — Z8673 Personal history of transient ischemic attack (TIA), and cerebral infarction without residual deficits: Secondary | ICD-10-CM | POA: Diagnosis not present

## 2020-08-20 DIAGNOSIS — R0902 Hypoxemia: Secondary | ICD-10-CM | POA: Diagnosis not present

## 2020-08-20 DIAGNOSIS — W19XXXA Unspecified fall, initial encounter: Secondary | ICD-10-CM | POA: Diagnosis not present

## 2020-08-20 DIAGNOSIS — S0990XA Unspecified injury of head, initial encounter: Secondary | ICD-10-CM | POA: Diagnosis not present

## 2020-08-20 DIAGNOSIS — R404 Transient alteration of awareness: Secondary | ICD-10-CM | POA: Diagnosis not present

## 2020-08-20 DIAGNOSIS — Z794 Long term (current) use of insulin: Secondary | ICD-10-CM | POA: Diagnosis not present

## 2020-08-20 DIAGNOSIS — Z743 Need for continuous supervision: Secondary | ICD-10-CM | POA: Diagnosis not present

## 2020-08-20 DIAGNOSIS — R6889 Other general symptoms and signs: Secondary | ICD-10-CM | POA: Diagnosis not present

## 2020-08-20 DIAGNOSIS — E871 Hypo-osmolality and hyponatremia: Secondary | ICD-10-CM | POA: Diagnosis not present

## 2020-08-20 DIAGNOSIS — R296 Repeated falls: Secondary | ICD-10-CM | POA: Diagnosis not present

## 2020-08-20 DIAGNOSIS — Z8673 Personal history of transient ischemic attack (TIA), and cerebral infarction without residual deficits: Secondary | ICD-10-CM | POA: Diagnosis not present

## 2020-08-20 DIAGNOSIS — R279 Unspecified lack of coordination: Secondary | ICD-10-CM | POA: Diagnosis not present

## 2020-08-20 DIAGNOSIS — E1165 Type 2 diabetes mellitus with hyperglycemia: Secondary | ICD-10-CM | POA: Diagnosis not present

## 2020-08-20 DIAGNOSIS — I10 Essential (primary) hypertension: Secondary | ICD-10-CM | POA: Diagnosis not present

## 2020-08-20 DIAGNOSIS — S52124D Nondisplaced fracture of head of right radius, subsequent encounter for closed fracture with routine healing: Secondary | ICD-10-CM | POA: Diagnosis not present

## 2020-08-20 DIAGNOSIS — I480 Paroxysmal atrial fibrillation: Secondary | ICD-10-CM | POA: Diagnosis not present

## 2020-08-24 DIAGNOSIS — S0003XA Contusion of scalp, initial encounter: Secondary | ICD-10-CM | POA: Diagnosis not present

## 2020-08-24 DIAGNOSIS — Z8673 Personal history of transient ischemic attack (TIA), and cerebral infarction without residual deficits: Secondary | ICD-10-CM | POA: Diagnosis not present

## 2020-08-24 DIAGNOSIS — Z886 Allergy status to analgesic agent status: Secondary | ICD-10-CM | POA: Diagnosis not present

## 2020-08-24 DIAGNOSIS — G8911 Acute pain due to trauma: Secondary | ICD-10-CM | POA: Diagnosis not present

## 2020-08-24 DIAGNOSIS — E1165 Type 2 diabetes mellitus with hyperglycemia: Secondary | ICD-10-CM | POA: Diagnosis not present

## 2020-08-24 DIAGNOSIS — S199XXA Unspecified injury of neck, initial encounter: Secondary | ICD-10-CM | POA: Diagnosis not present

## 2020-08-24 DIAGNOSIS — R0902 Hypoxemia: Secondary | ICD-10-CM | POA: Diagnosis not present

## 2020-08-24 DIAGNOSIS — Z794 Long term (current) use of insulin: Secondary | ICD-10-CM | POA: Diagnosis not present

## 2020-08-24 DIAGNOSIS — Z79899 Other long term (current) drug therapy: Secondary | ICD-10-CM | POA: Diagnosis not present

## 2020-08-24 DIAGNOSIS — S42401A Unspecified fracture of lower end of right humerus, initial encounter for closed fracture: Secondary | ICD-10-CM | POA: Diagnosis not present

## 2020-08-24 DIAGNOSIS — S59901A Unspecified injury of right elbow, initial encounter: Secondary | ICD-10-CM | POA: Diagnosis not present

## 2020-08-24 DIAGNOSIS — M79603 Pain in arm, unspecified: Secondary | ICD-10-CM | POA: Diagnosis not present

## 2020-08-24 DIAGNOSIS — Z883 Allergy status to other anti-infective agents status: Secondary | ICD-10-CM | POA: Diagnosis not present

## 2020-08-24 DIAGNOSIS — Z888 Allergy status to other drugs, medicaments and biological substances status: Secondary | ICD-10-CM | POA: Diagnosis not present

## 2020-08-24 DIAGNOSIS — Z7901 Long term (current) use of anticoagulants: Secondary | ICD-10-CM | POA: Diagnosis not present

## 2020-08-24 DIAGNOSIS — Z7982 Long term (current) use of aspirin: Secondary | ICD-10-CM | POA: Diagnosis not present

## 2020-08-24 DIAGNOSIS — I10 Essential (primary) hypertension: Secondary | ICD-10-CM | POA: Diagnosis not present

## 2020-08-24 DIAGNOSIS — Z743 Need for continuous supervision: Secondary | ICD-10-CM | POA: Diagnosis not present

## 2020-08-24 DIAGNOSIS — R739 Hyperglycemia, unspecified: Secondary | ICD-10-CM | POA: Diagnosis not present

## 2020-08-24 DIAGNOSIS — S0990XA Unspecified injury of head, initial encounter: Secondary | ICD-10-CM | POA: Diagnosis not present

## 2020-08-24 DIAGNOSIS — R6889 Other general symptoms and signs: Secondary | ICD-10-CM | POA: Diagnosis not present

## 2020-08-24 DIAGNOSIS — S0083XA Contusion of other part of head, initial encounter: Secondary | ICD-10-CM | POA: Diagnosis not present

## 2020-08-25 DIAGNOSIS — R404 Transient alteration of awareness: Secondary | ICD-10-CM | POA: Diagnosis not present

## 2020-08-25 DIAGNOSIS — Z743 Need for continuous supervision: Secondary | ICD-10-CM | POA: Diagnosis not present

## 2020-08-25 DIAGNOSIS — R279 Unspecified lack of coordination: Secondary | ICD-10-CM | POA: Diagnosis not present

## 2020-08-25 DIAGNOSIS — R410 Disorientation, unspecified: Secondary | ICD-10-CM | POA: Diagnosis not present

## 2020-08-27 DIAGNOSIS — E1165 Type 2 diabetes mellitus with hyperglycemia: Secondary | ICD-10-CM | POA: Diagnosis not present

## 2020-08-27 DIAGNOSIS — M6281 Muscle weakness (generalized): Secondary | ICD-10-CM | POA: Diagnosis not present

## 2020-08-31 DIAGNOSIS — R58 Hemorrhage, not elsewhere classified: Secondary | ICD-10-CM | POA: Diagnosis not present

## 2020-08-31 DIAGNOSIS — Z7401 Bed confinement status: Secondary | ICD-10-CM | POA: Diagnosis not present

## 2020-08-31 DIAGNOSIS — K644 Residual hemorrhoidal skin tags: Secondary | ICD-10-CM | POA: Diagnosis not present

## 2020-08-31 DIAGNOSIS — Z885 Allergy status to narcotic agent status: Secondary | ICD-10-CM | POA: Diagnosis not present

## 2020-08-31 DIAGNOSIS — Z881 Allergy status to other antibiotic agents status: Secondary | ICD-10-CM | POA: Diagnosis not present

## 2020-08-31 DIAGNOSIS — I10 Essential (primary) hypertension: Secondary | ICD-10-CM | POA: Diagnosis not present

## 2020-08-31 DIAGNOSIS — Z7982 Long term (current) use of aspirin: Secondary | ICD-10-CM | POA: Diagnosis not present

## 2020-08-31 DIAGNOSIS — K625 Hemorrhage of anus and rectum: Secondary | ICD-10-CM | POA: Diagnosis not present

## 2020-08-31 DIAGNOSIS — Z8673 Personal history of transient ischemic attack (TIA), and cerebral infarction without residual deficits: Secondary | ICD-10-CM | POA: Diagnosis not present

## 2020-08-31 DIAGNOSIS — R404 Transient alteration of awareness: Secondary | ICD-10-CM | POA: Diagnosis not present

## 2020-08-31 DIAGNOSIS — M2550 Pain in unspecified joint: Secondary | ICD-10-CM | POA: Diagnosis not present

## 2020-08-31 DIAGNOSIS — Z888 Allergy status to other drugs, medicaments and biological substances status: Secondary | ICD-10-CM | POA: Diagnosis not present

## 2020-08-31 DIAGNOSIS — Z7901 Long term (current) use of anticoagulants: Secondary | ICD-10-CM | POA: Diagnosis not present

## 2020-08-31 DIAGNOSIS — R0902 Hypoxemia: Secondary | ICD-10-CM | POA: Diagnosis not present

## 2020-08-31 DIAGNOSIS — E119 Type 2 diabetes mellitus without complications: Secondary | ICD-10-CM | POA: Diagnosis not present

## 2020-08-31 DIAGNOSIS — R6889 Other general symptoms and signs: Secondary | ICD-10-CM | POA: Diagnosis not present

## 2020-08-31 DIAGNOSIS — Z743 Need for continuous supervision: Secondary | ICD-10-CM | POA: Diagnosis not present

## 2020-08-31 DIAGNOSIS — E1165 Type 2 diabetes mellitus with hyperglycemia: Secondary | ICD-10-CM | POA: Diagnosis not present

## 2020-08-31 DIAGNOSIS — K921 Melena: Secondary | ICD-10-CM | POA: Diagnosis not present

## 2020-08-31 DIAGNOSIS — N390 Urinary tract infection, site not specified: Secondary | ICD-10-CM | POA: Diagnosis not present

## 2020-08-31 DIAGNOSIS — Z794 Long term (current) use of insulin: Secondary | ICD-10-CM | POA: Diagnosis not present

## 2020-09-03 DIAGNOSIS — K649 Unspecified hemorrhoids: Secondary | ICD-10-CM | POA: Diagnosis not present

## 2020-09-03 DIAGNOSIS — E1165 Type 2 diabetes mellitus with hyperglycemia: Secondary | ICD-10-CM | POA: Diagnosis not present

## 2020-09-09 DIAGNOSIS — E871 Hypo-osmolality and hyponatremia: Secondary | ICD-10-CM | POA: Diagnosis not present

## 2020-09-09 DIAGNOSIS — Z7982 Long term (current) use of aspirin: Secondary | ICD-10-CM | POA: Diagnosis not present

## 2020-09-09 DIAGNOSIS — S52131D Displaced fracture of neck of right radius, subsequent encounter for closed fracture with routine healing: Secondary | ICD-10-CM | POA: Diagnosis not present

## 2020-09-09 DIAGNOSIS — I69351 Hemiplegia and hemiparesis following cerebral infarction affecting right dominant side: Secondary | ICD-10-CM | POA: Diagnosis not present

## 2020-09-09 DIAGNOSIS — N1832 Chronic kidney disease, stage 3b: Secondary | ICD-10-CM | POA: Diagnosis not present

## 2020-09-09 DIAGNOSIS — K219 Gastro-esophageal reflux disease without esophagitis: Secondary | ICD-10-CM | POA: Diagnosis not present

## 2020-09-09 DIAGNOSIS — F32A Depression, unspecified: Secondary | ICD-10-CM | POA: Diagnosis not present

## 2020-09-09 DIAGNOSIS — E039 Hypothyroidism, unspecified: Secondary | ICD-10-CM | POA: Diagnosis not present

## 2020-09-09 DIAGNOSIS — Z87891 Personal history of nicotine dependence: Secondary | ICD-10-CM | POA: Diagnosis not present

## 2020-09-09 DIAGNOSIS — I480 Paroxysmal atrial fibrillation: Secondary | ICD-10-CM | POA: Diagnosis not present

## 2020-09-09 DIAGNOSIS — E785 Hyperlipidemia, unspecified: Secondary | ICD-10-CM | POA: Diagnosis not present

## 2020-09-09 DIAGNOSIS — Z9181 History of falling: Secondary | ICD-10-CM | POA: Diagnosis not present

## 2020-09-09 DIAGNOSIS — E876 Hypokalemia: Secondary | ICD-10-CM | POA: Diagnosis not present

## 2020-09-09 DIAGNOSIS — I69320 Aphasia following cerebral infarction: Secondary | ICD-10-CM | POA: Diagnosis not present

## 2020-09-09 DIAGNOSIS — E1122 Type 2 diabetes mellitus with diabetic chronic kidney disease: Secondary | ICD-10-CM | POA: Diagnosis not present

## 2020-09-09 DIAGNOSIS — J449 Chronic obstructive pulmonary disease, unspecified: Secondary | ICD-10-CM | POA: Diagnosis not present

## 2020-09-09 DIAGNOSIS — M810 Age-related osteoporosis without current pathological fracture: Secondary | ICD-10-CM | POA: Diagnosis not present

## 2020-09-09 DIAGNOSIS — I69322 Dysarthria following cerebral infarction: Secondary | ICD-10-CM | POA: Diagnosis not present

## 2020-09-09 DIAGNOSIS — Z794 Long term (current) use of insulin: Secondary | ICD-10-CM | POA: Diagnosis not present

## 2020-09-09 DIAGNOSIS — G4700 Insomnia, unspecified: Secondary | ICD-10-CM | POA: Diagnosis not present

## 2020-09-09 DIAGNOSIS — K5900 Constipation, unspecified: Secondary | ICD-10-CM | POA: Diagnosis not present

## 2020-09-09 DIAGNOSIS — I129 Hypertensive chronic kidney disease with stage 1 through stage 4 chronic kidney disease, or unspecified chronic kidney disease: Secondary | ICD-10-CM | POA: Diagnosis not present

## 2020-09-10 DIAGNOSIS — Z7982 Long term (current) use of aspirin: Secondary | ICD-10-CM | POA: Diagnosis not present

## 2020-09-10 DIAGNOSIS — E1165 Type 2 diabetes mellitus with hyperglycemia: Secondary | ICD-10-CM | POA: Diagnosis not present

## 2020-09-10 DIAGNOSIS — K5900 Constipation, unspecified: Secondary | ICD-10-CM | POA: Diagnosis not present

## 2020-09-10 DIAGNOSIS — Z87891 Personal history of nicotine dependence: Secondary | ICD-10-CM | POA: Diagnosis not present

## 2020-09-10 DIAGNOSIS — E1122 Type 2 diabetes mellitus with diabetic chronic kidney disease: Secondary | ICD-10-CM | POA: Diagnosis not present

## 2020-09-10 DIAGNOSIS — I69320 Aphasia following cerebral infarction: Secondary | ICD-10-CM | POA: Diagnosis not present

## 2020-09-10 DIAGNOSIS — I480 Paroxysmal atrial fibrillation: Secondary | ICD-10-CM | POA: Diagnosis not present

## 2020-09-10 DIAGNOSIS — N1832 Chronic kidney disease, stage 3b: Secondary | ICD-10-CM | POA: Diagnosis not present

## 2020-09-10 DIAGNOSIS — J449 Chronic obstructive pulmonary disease, unspecified: Secondary | ICD-10-CM | POA: Diagnosis not present

## 2020-09-10 DIAGNOSIS — E876 Hypokalemia: Secondary | ICD-10-CM | POA: Diagnosis not present

## 2020-09-10 DIAGNOSIS — G4700 Insomnia, unspecified: Secondary | ICD-10-CM | POA: Diagnosis not present

## 2020-09-10 DIAGNOSIS — S52131D Displaced fracture of neck of right radius, subsequent encounter for closed fracture with routine healing: Secondary | ICD-10-CM | POA: Diagnosis not present

## 2020-09-10 DIAGNOSIS — I129 Hypertensive chronic kidney disease with stage 1 through stage 4 chronic kidney disease, or unspecified chronic kidney disease: Secondary | ICD-10-CM | POA: Diagnosis not present

## 2020-09-10 DIAGNOSIS — K219 Gastro-esophageal reflux disease without esophagitis: Secondary | ICD-10-CM | POA: Diagnosis not present

## 2020-09-10 DIAGNOSIS — I69322 Dysarthria following cerebral infarction: Secondary | ICD-10-CM | POA: Diagnosis not present

## 2020-09-10 DIAGNOSIS — E785 Hyperlipidemia, unspecified: Secondary | ICD-10-CM | POA: Diagnosis not present

## 2020-09-10 DIAGNOSIS — F32A Depression, unspecified: Secondary | ICD-10-CM | POA: Diagnosis not present

## 2020-09-10 DIAGNOSIS — M810 Age-related osteoporosis without current pathological fracture: Secondary | ICD-10-CM | POA: Diagnosis not present

## 2020-09-10 DIAGNOSIS — Z9181 History of falling: Secondary | ICD-10-CM | POA: Diagnosis not present

## 2020-09-10 DIAGNOSIS — E039 Hypothyroidism, unspecified: Secondary | ICD-10-CM | POA: Diagnosis not present

## 2020-09-10 DIAGNOSIS — I69351 Hemiplegia and hemiparesis following cerebral infarction affecting right dominant side: Secondary | ICD-10-CM | POA: Diagnosis not present

## 2020-09-10 DIAGNOSIS — E871 Hypo-osmolality and hyponatremia: Secondary | ICD-10-CM | POA: Diagnosis not present

## 2020-09-10 DIAGNOSIS — Z794 Long term (current) use of insulin: Secondary | ICD-10-CM | POA: Diagnosis not present

## 2020-09-15 DIAGNOSIS — E785 Hyperlipidemia, unspecified: Secondary | ICD-10-CM | POA: Diagnosis not present

## 2020-09-15 DIAGNOSIS — Z794 Long term (current) use of insulin: Secondary | ICD-10-CM | POA: Diagnosis not present

## 2020-09-15 DIAGNOSIS — G4700 Insomnia, unspecified: Secondary | ICD-10-CM | POA: Diagnosis not present

## 2020-09-15 DIAGNOSIS — Z7982 Long term (current) use of aspirin: Secondary | ICD-10-CM | POA: Diagnosis not present

## 2020-09-15 DIAGNOSIS — E559 Vitamin D deficiency, unspecified: Secondary | ICD-10-CM | POA: Diagnosis not present

## 2020-09-15 DIAGNOSIS — I69320 Aphasia following cerebral infarction: Secondary | ICD-10-CM | POA: Diagnosis not present

## 2020-09-15 DIAGNOSIS — I129 Hypertensive chronic kidney disease with stage 1 through stage 4 chronic kidney disease, or unspecified chronic kidney disease: Secondary | ICD-10-CM | POA: Diagnosis not present

## 2020-09-15 DIAGNOSIS — J449 Chronic obstructive pulmonary disease, unspecified: Secondary | ICD-10-CM | POA: Diagnosis not present

## 2020-09-15 DIAGNOSIS — K5900 Constipation, unspecified: Secondary | ICD-10-CM | POA: Diagnosis not present

## 2020-09-15 DIAGNOSIS — Z87891 Personal history of nicotine dependence: Secondary | ICD-10-CM | POA: Diagnosis not present

## 2020-09-15 DIAGNOSIS — I10 Essential (primary) hypertension: Secondary | ICD-10-CM | POA: Diagnosis not present

## 2020-09-15 DIAGNOSIS — I480 Paroxysmal atrial fibrillation: Secondary | ICD-10-CM | POA: Diagnosis not present

## 2020-09-15 DIAGNOSIS — E039 Hypothyroidism, unspecified: Secondary | ICD-10-CM | POA: Diagnosis not present

## 2020-09-15 DIAGNOSIS — D519 Vitamin B12 deficiency anemia, unspecified: Secondary | ICD-10-CM | POA: Diagnosis not present

## 2020-09-15 DIAGNOSIS — E119 Type 2 diabetes mellitus without complications: Secondary | ICD-10-CM | POA: Diagnosis not present

## 2020-09-15 DIAGNOSIS — I69322 Dysarthria following cerebral infarction: Secondary | ICD-10-CM | POA: Diagnosis not present

## 2020-09-15 DIAGNOSIS — E871 Hypo-osmolality and hyponatremia: Secondary | ICD-10-CM | POA: Diagnosis not present

## 2020-09-15 DIAGNOSIS — K219 Gastro-esophageal reflux disease without esophagitis: Secondary | ICD-10-CM | POA: Diagnosis not present

## 2020-09-15 DIAGNOSIS — S52131D Displaced fracture of neck of right radius, subsequent encounter for closed fracture with routine healing: Secondary | ICD-10-CM | POA: Diagnosis not present

## 2020-09-15 DIAGNOSIS — Z9181 History of falling: Secondary | ICD-10-CM | POA: Diagnosis not present

## 2020-09-15 DIAGNOSIS — I69351 Hemiplegia and hemiparesis following cerebral infarction affecting right dominant side: Secondary | ICD-10-CM | POA: Diagnosis not present

## 2020-09-15 DIAGNOSIS — F32A Depression, unspecified: Secondary | ICD-10-CM | POA: Diagnosis not present

## 2020-09-15 DIAGNOSIS — E876 Hypokalemia: Secondary | ICD-10-CM | POA: Diagnosis not present

## 2020-09-15 DIAGNOSIS — M810 Age-related osteoporosis without current pathological fracture: Secondary | ICD-10-CM | POA: Diagnosis not present

## 2020-09-15 DIAGNOSIS — E1122 Type 2 diabetes mellitus with diabetic chronic kidney disease: Secondary | ICD-10-CM | POA: Diagnosis not present

## 2020-09-15 DIAGNOSIS — N1832 Chronic kidney disease, stage 3b: Secondary | ICD-10-CM | POA: Diagnosis not present

## 2020-09-17 DIAGNOSIS — Z794 Long term (current) use of insulin: Secondary | ICD-10-CM | POA: Diagnosis not present

## 2020-09-17 DIAGNOSIS — Z87891 Personal history of nicotine dependence: Secondary | ICD-10-CM | POA: Diagnosis not present

## 2020-09-17 DIAGNOSIS — I69320 Aphasia following cerebral infarction: Secondary | ICD-10-CM | POA: Diagnosis not present

## 2020-09-17 DIAGNOSIS — I480 Paroxysmal atrial fibrillation: Secondary | ICD-10-CM | POA: Diagnosis not present

## 2020-09-17 DIAGNOSIS — G4700 Insomnia, unspecified: Secondary | ICD-10-CM | POA: Diagnosis not present

## 2020-09-17 DIAGNOSIS — M810 Age-related osteoporosis without current pathological fracture: Secondary | ICD-10-CM | POA: Diagnosis not present

## 2020-09-17 DIAGNOSIS — J449 Chronic obstructive pulmonary disease, unspecified: Secondary | ICD-10-CM | POA: Diagnosis not present

## 2020-09-17 DIAGNOSIS — E785 Hyperlipidemia, unspecified: Secondary | ICD-10-CM | POA: Diagnosis not present

## 2020-09-17 DIAGNOSIS — Z9181 History of falling: Secondary | ICD-10-CM | POA: Diagnosis not present

## 2020-09-17 DIAGNOSIS — E039 Hypothyroidism, unspecified: Secondary | ICD-10-CM | POA: Diagnosis not present

## 2020-09-17 DIAGNOSIS — K219 Gastro-esophageal reflux disease without esophagitis: Secondary | ICD-10-CM | POA: Diagnosis not present

## 2020-09-17 DIAGNOSIS — Z7982 Long term (current) use of aspirin: Secondary | ICD-10-CM | POA: Diagnosis not present

## 2020-09-17 DIAGNOSIS — E876 Hypokalemia: Secondary | ICD-10-CM | POA: Diagnosis not present

## 2020-09-17 DIAGNOSIS — I69351 Hemiplegia and hemiparesis following cerebral infarction affecting right dominant side: Secondary | ICD-10-CM | POA: Diagnosis not present

## 2020-09-17 DIAGNOSIS — S52131D Displaced fracture of neck of right radius, subsequent encounter for closed fracture with routine healing: Secondary | ICD-10-CM | POA: Diagnosis not present

## 2020-09-17 DIAGNOSIS — I129 Hypertensive chronic kidney disease with stage 1 through stage 4 chronic kidney disease, or unspecified chronic kidney disease: Secondary | ICD-10-CM | POA: Diagnosis not present

## 2020-09-17 DIAGNOSIS — E1165 Type 2 diabetes mellitus with hyperglycemia: Secondary | ICD-10-CM | POA: Diagnosis not present

## 2020-09-17 DIAGNOSIS — E1122 Type 2 diabetes mellitus with diabetic chronic kidney disease: Secondary | ICD-10-CM | POA: Diagnosis not present

## 2020-09-17 DIAGNOSIS — F32A Depression, unspecified: Secondary | ICD-10-CM | POA: Diagnosis not present

## 2020-09-17 DIAGNOSIS — K5900 Constipation, unspecified: Secondary | ICD-10-CM | POA: Diagnosis not present

## 2020-09-17 DIAGNOSIS — I69322 Dysarthria following cerebral infarction: Secondary | ICD-10-CM | POA: Diagnosis not present

## 2020-09-17 DIAGNOSIS — E871 Hypo-osmolality and hyponatremia: Secondary | ICD-10-CM | POA: Diagnosis not present

## 2020-09-17 DIAGNOSIS — N1832 Chronic kidney disease, stage 3b: Secondary | ICD-10-CM | POA: Diagnosis not present

## 2020-09-20 DIAGNOSIS — E1165 Type 2 diabetes mellitus with hyperglycemia: Secondary | ICD-10-CM | POA: Diagnosis not present

## 2020-09-20 DIAGNOSIS — I482 Chronic atrial fibrillation, unspecified: Secondary | ICD-10-CM | POA: Diagnosis not present

## 2020-09-20 DIAGNOSIS — I10 Essential (primary) hypertension: Secondary | ICD-10-CM | POA: Diagnosis not present

## 2020-09-21 DIAGNOSIS — Z794 Long term (current) use of insulin: Secondary | ICD-10-CM | POA: Diagnosis not present

## 2020-09-21 DIAGNOSIS — E876 Hypokalemia: Secondary | ICD-10-CM | POA: Diagnosis not present

## 2020-09-21 DIAGNOSIS — I480 Paroxysmal atrial fibrillation: Secondary | ICD-10-CM | POA: Diagnosis not present

## 2020-09-21 DIAGNOSIS — Z7982 Long term (current) use of aspirin: Secondary | ICD-10-CM | POA: Diagnosis not present

## 2020-09-21 DIAGNOSIS — K219 Gastro-esophageal reflux disease without esophagitis: Secondary | ICD-10-CM | POA: Diagnosis not present

## 2020-09-21 DIAGNOSIS — N1832 Chronic kidney disease, stage 3b: Secondary | ICD-10-CM | POA: Diagnosis not present

## 2020-09-21 DIAGNOSIS — I69351 Hemiplegia and hemiparesis following cerebral infarction affecting right dominant side: Secondary | ICD-10-CM | POA: Diagnosis not present

## 2020-09-21 DIAGNOSIS — I129 Hypertensive chronic kidney disease with stage 1 through stage 4 chronic kidney disease, or unspecified chronic kidney disease: Secondary | ICD-10-CM | POA: Diagnosis not present

## 2020-09-21 DIAGNOSIS — S52131D Displaced fracture of neck of right radius, subsequent encounter for closed fracture with routine healing: Secondary | ICD-10-CM | POA: Diagnosis not present

## 2020-09-21 DIAGNOSIS — F32A Depression, unspecified: Secondary | ICD-10-CM | POA: Diagnosis not present

## 2020-09-21 DIAGNOSIS — E785 Hyperlipidemia, unspecified: Secondary | ICD-10-CM | POA: Diagnosis not present

## 2020-09-21 DIAGNOSIS — I69320 Aphasia following cerebral infarction: Secondary | ICD-10-CM | POA: Diagnosis not present

## 2020-09-21 DIAGNOSIS — Z9181 History of falling: Secondary | ICD-10-CM | POA: Diagnosis not present

## 2020-09-21 DIAGNOSIS — E039 Hypothyroidism, unspecified: Secondary | ICD-10-CM | POA: Diagnosis not present

## 2020-09-21 DIAGNOSIS — K5900 Constipation, unspecified: Secondary | ICD-10-CM | POA: Diagnosis not present

## 2020-09-21 DIAGNOSIS — M810 Age-related osteoporosis without current pathological fracture: Secondary | ICD-10-CM | POA: Diagnosis not present

## 2020-09-21 DIAGNOSIS — E1122 Type 2 diabetes mellitus with diabetic chronic kidney disease: Secondary | ICD-10-CM | POA: Diagnosis not present

## 2020-09-21 DIAGNOSIS — J449 Chronic obstructive pulmonary disease, unspecified: Secondary | ICD-10-CM | POA: Diagnosis not present

## 2020-09-21 DIAGNOSIS — E871 Hypo-osmolality and hyponatremia: Secondary | ICD-10-CM | POA: Diagnosis not present

## 2020-09-21 DIAGNOSIS — Z87891 Personal history of nicotine dependence: Secondary | ICD-10-CM | POA: Diagnosis not present

## 2020-09-21 DIAGNOSIS — G4700 Insomnia, unspecified: Secondary | ICD-10-CM | POA: Diagnosis not present

## 2020-09-21 DIAGNOSIS — I69322 Dysarthria following cerebral infarction: Secondary | ICD-10-CM | POA: Diagnosis not present

## 2020-09-22 DIAGNOSIS — K219 Gastro-esophageal reflux disease without esophagitis: Secondary | ICD-10-CM | POA: Diagnosis not present

## 2020-09-22 DIAGNOSIS — Z87891 Personal history of nicotine dependence: Secondary | ICD-10-CM | POA: Diagnosis not present

## 2020-09-22 DIAGNOSIS — E871 Hypo-osmolality and hyponatremia: Secondary | ICD-10-CM | POA: Diagnosis not present

## 2020-09-22 DIAGNOSIS — I69322 Dysarthria following cerebral infarction: Secondary | ICD-10-CM | POA: Diagnosis not present

## 2020-09-22 DIAGNOSIS — F32A Depression, unspecified: Secondary | ICD-10-CM | POA: Diagnosis not present

## 2020-09-22 DIAGNOSIS — Z7982 Long term (current) use of aspirin: Secondary | ICD-10-CM | POA: Diagnosis not present

## 2020-09-22 DIAGNOSIS — I69351 Hemiplegia and hemiparesis following cerebral infarction affecting right dominant side: Secondary | ICD-10-CM | POA: Diagnosis not present

## 2020-09-22 DIAGNOSIS — E039 Hypothyroidism, unspecified: Secondary | ICD-10-CM | POA: Diagnosis not present

## 2020-09-22 DIAGNOSIS — S52131D Displaced fracture of neck of right radius, subsequent encounter for closed fracture with routine healing: Secondary | ICD-10-CM | POA: Diagnosis not present

## 2020-09-22 DIAGNOSIS — N1832 Chronic kidney disease, stage 3b: Secondary | ICD-10-CM | POA: Diagnosis not present

## 2020-09-22 DIAGNOSIS — E1122 Type 2 diabetes mellitus with diabetic chronic kidney disease: Secondary | ICD-10-CM | POA: Diagnosis not present

## 2020-09-22 DIAGNOSIS — Z794 Long term (current) use of insulin: Secondary | ICD-10-CM | POA: Diagnosis not present

## 2020-09-22 DIAGNOSIS — Z9181 History of falling: Secondary | ICD-10-CM | POA: Diagnosis not present

## 2020-09-22 DIAGNOSIS — E876 Hypokalemia: Secondary | ICD-10-CM | POA: Diagnosis not present

## 2020-09-22 DIAGNOSIS — I129 Hypertensive chronic kidney disease with stage 1 through stage 4 chronic kidney disease, or unspecified chronic kidney disease: Secondary | ICD-10-CM | POA: Diagnosis not present

## 2020-09-22 DIAGNOSIS — E785 Hyperlipidemia, unspecified: Secondary | ICD-10-CM | POA: Diagnosis not present

## 2020-09-22 DIAGNOSIS — G4700 Insomnia, unspecified: Secondary | ICD-10-CM | POA: Diagnosis not present

## 2020-09-22 DIAGNOSIS — I69320 Aphasia following cerebral infarction: Secondary | ICD-10-CM | POA: Diagnosis not present

## 2020-09-22 DIAGNOSIS — J449 Chronic obstructive pulmonary disease, unspecified: Secondary | ICD-10-CM | POA: Diagnosis not present

## 2020-09-22 DIAGNOSIS — M810 Age-related osteoporosis without current pathological fracture: Secondary | ICD-10-CM | POA: Diagnosis not present

## 2020-09-22 DIAGNOSIS — I480 Paroxysmal atrial fibrillation: Secondary | ICD-10-CM | POA: Diagnosis not present

## 2020-09-22 DIAGNOSIS — K5900 Constipation, unspecified: Secondary | ICD-10-CM | POA: Diagnosis not present

## 2020-09-23 DIAGNOSIS — M810 Age-related osteoporosis without current pathological fracture: Secondary | ICD-10-CM | POA: Diagnosis not present

## 2020-09-23 DIAGNOSIS — Z794 Long term (current) use of insulin: Secondary | ICD-10-CM | POA: Diagnosis not present

## 2020-09-23 DIAGNOSIS — I129 Hypertensive chronic kidney disease with stage 1 through stage 4 chronic kidney disease, or unspecified chronic kidney disease: Secondary | ICD-10-CM | POA: Diagnosis not present

## 2020-09-23 DIAGNOSIS — E871 Hypo-osmolality and hyponatremia: Secondary | ICD-10-CM | POA: Diagnosis not present

## 2020-09-23 DIAGNOSIS — F32A Depression, unspecified: Secondary | ICD-10-CM | POA: Diagnosis not present

## 2020-09-23 DIAGNOSIS — G4700 Insomnia, unspecified: Secondary | ICD-10-CM | POA: Diagnosis not present

## 2020-09-23 DIAGNOSIS — Z7982 Long term (current) use of aspirin: Secondary | ICD-10-CM | POA: Diagnosis not present

## 2020-09-23 DIAGNOSIS — Z9181 History of falling: Secondary | ICD-10-CM | POA: Diagnosis not present

## 2020-09-23 DIAGNOSIS — I69322 Dysarthria following cerebral infarction: Secondary | ICD-10-CM | POA: Diagnosis not present

## 2020-09-23 DIAGNOSIS — J449 Chronic obstructive pulmonary disease, unspecified: Secondary | ICD-10-CM | POA: Diagnosis not present

## 2020-09-23 DIAGNOSIS — E039 Hypothyroidism, unspecified: Secondary | ICD-10-CM | POA: Diagnosis not present

## 2020-09-23 DIAGNOSIS — I69351 Hemiplegia and hemiparesis following cerebral infarction affecting right dominant side: Secondary | ICD-10-CM | POA: Diagnosis not present

## 2020-09-23 DIAGNOSIS — S52131D Displaced fracture of neck of right radius, subsequent encounter for closed fracture with routine healing: Secondary | ICD-10-CM | POA: Diagnosis not present

## 2020-09-23 DIAGNOSIS — K5900 Constipation, unspecified: Secondary | ICD-10-CM | POA: Diagnosis not present

## 2020-09-23 DIAGNOSIS — E785 Hyperlipidemia, unspecified: Secondary | ICD-10-CM | POA: Diagnosis not present

## 2020-09-23 DIAGNOSIS — E1122 Type 2 diabetes mellitus with diabetic chronic kidney disease: Secondary | ICD-10-CM | POA: Diagnosis not present

## 2020-09-23 DIAGNOSIS — I69320 Aphasia following cerebral infarction: Secondary | ICD-10-CM | POA: Diagnosis not present

## 2020-09-23 DIAGNOSIS — N1832 Chronic kidney disease, stage 3b: Secondary | ICD-10-CM | POA: Diagnosis not present

## 2020-09-23 DIAGNOSIS — I480 Paroxysmal atrial fibrillation: Secondary | ICD-10-CM | POA: Diagnosis not present

## 2020-09-23 DIAGNOSIS — K219 Gastro-esophageal reflux disease without esophagitis: Secondary | ICD-10-CM | POA: Diagnosis not present

## 2020-09-23 DIAGNOSIS — Z87891 Personal history of nicotine dependence: Secondary | ICD-10-CM | POA: Diagnosis not present

## 2020-09-23 DIAGNOSIS — E876 Hypokalemia: Secondary | ICD-10-CM | POA: Diagnosis not present

## 2020-09-24 DIAGNOSIS — E785 Hyperlipidemia, unspecified: Secondary | ICD-10-CM | POA: Diagnosis not present

## 2020-09-24 DIAGNOSIS — Z794 Long term (current) use of insulin: Secondary | ICD-10-CM | POA: Diagnosis not present

## 2020-09-24 DIAGNOSIS — F32A Depression, unspecified: Secondary | ICD-10-CM | POA: Diagnosis not present

## 2020-09-24 DIAGNOSIS — Z7982 Long term (current) use of aspirin: Secondary | ICD-10-CM | POA: Diagnosis not present

## 2020-09-24 DIAGNOSIS — I129 Hypertensive chronic kidney disease with stage 1 through stage 4 chronic kidney disease, or unspecified chronic kidney disease: Secondary | ICD-10-CM | POA: Diagnosis not present

## 2020-09-24 DIAGNOSIS — N1832 Chronic kidney disease, stage 3b: Secondary | ICD-10-CM | POA: Diagnosis not present

## 2020-09-24 DIAGNOSIS — K5900 Constipation, unspecified: Secondary | ICD-10-CM | POA: Diagnosis not present

## 2020-09-24 DIAGNOSIS — K219 Gastro-esophageal reflux disease without esophagitis: Secondary | ICD-10-CM | POA: Diagnosis not present

## 2020-09-24 DIAGNOSIS — Z87891 Personal history of nicotine dependence: Secondary | ICD-10-CM | POA: Diagnosis not present

## 2020-09-24 DIAGNOSIS — S52131D Displaced fracture of neck of right radius, subsequent encounter for closed fracture with routine healing: Secondary | ICD-10-CM | POA: Diagnosis not present

## 2020-09-24 DIAGNOSIS — E871 Hypo-osmolality and hyponatremia: Secondary | ICD-10-CM | POA: Diagnosis not present

## 2020-09-24 DIAGNOSIS — E039 Hypothyroidism, unspecified: Secondary | ICD-10-CM | POA: Diagnosis not present

## 2020-09-24 DIAGNOSIS — J449 Chronic obstructive pulmonary disease, unspecified: Secondary | ICD-10-CM | POA: Diagnosis not present

## 2020-09-24 DIAGNOSIS — E876 Hypokalemia: Secondary | ICD-10-CM | POA: Diagnosis not present

## 2020-09-24 DIAGNOSIS — M810 Age-related osteoporosis without current pathological fracture: Secondary | ICD-10-CM | POA: Diagnosis not present

## 2020-09-24 DIAGNOSIS — I69351 Hemiplegia and hemiparesis following cerebral infarction affecting right dominant side: Secondary | ICD-10-CM | POA: Diagnosis not present

## 2020-09-24 DIAGNOSIS — E1122 Type 2 diabetes mellitus with diabetic chronic kidney disease: Secondary | ICD-10-CM | POA: Diagnosis not present

## 2020-09-24 DIAGNOSIS — I480 Paroxysmal atrial fibrillation: Secondary | ICD-10-CM | POA: Diagnosis not present

## 2020-09-24 DIAGNOSIS — I69320 Aphasia following cerebral infarction: Secondary | ICD-10-CM | POA: Diagnosis not present

## 2020-09-24 DIAGNOSIS — G4700 Insomnia, unspecified: Secondary | ICD-10-CM | POA: Diagnosis not present

## 2020-09-24 DIAGNOSIS — I69322 Dysarthria following cerebral infarction: Secondary | ICD-10-CM | POA: Diagnosis not present

## 2020-09-24 DIAGNOSIS — Z9181 History of falling: Secondary | ICD-10-CM | POA: Diagnosis not present

## 2020-09-28 DIAGNOSIS — Z7982 Long term (current) use of aspirin: Secondary | ICD-10-CM | POA: Diagnosis not present

## 2020-09-28 DIAGNOSIS — Z794 Long term (current) use of insulin: Secondary | ICD-10-CM | POA: Diagnosis not present

## 2020-09-28 DIAGNOSIS — I129 Hypertensive chronic kidney disease with stage 1 through stage 4 chronic kidney disease, or unspecified chronic kidney disease: Secondary | ICD-10-CM | POA: Diagnosis not present

## 2020-09-28 DIAGNOSIS — S52131D Displaced fracture of neck of right radius, subsequent encounter for closed fracture with routine healing: Secondary | ICD-10-CM | POA: Diagnosis not present

## 2020-09-28 DIAGNOSIS — E871 Hypo-osmolality and hyponatremia: Secondary | ICD-10-CM | POA: Diagnosis not present

## 2020-09-28 DIAGNOSIS — I69322 Dysarthria following cerebral infarction: Secondary | ICD-10-CM | POA: Diagnosis not present

## 2020-09-28 DIAGNOSIS — I69351 Hemiplegia and hemiparesis following cerebral infarction affecting right dominant side: Secondary | ICD-10-CM | POA: Diagnosis not present

## 2020-09-28 DIAGNOSIS — E039 Hypothyroidism, unspecified: Secondary | ICD-10-CM | POA: Diagnosis not present

## 2020-09-28 DIAGNOSIS — E876 Hypokalemia: Secondary | ICD-10-CM | POA: Diagnosis not present

## 2020-09-28 DIAGNOSIS — E785 Hyperlipidemia, unspecified: Secondary | ICD-10-CM | POA: Diagnosis not present

## 2020-09-28 DIAGNOSIS — I69320 Aphasia following cerebral infarction: Secondary | ICD-10-CM | POA: Diagnosis not present

## 2020-09-28 DIAGNOSIS — J449 Chronic obstructive pulmonary disease, unspecified: Secondary | ICD-10-CM | POA: Diagnosis not present

## 2020-09-28 DIAGNOSIS — I480 Paroxysmal atrial fibrillation: Secondary | ICD-10-CM | POA: Diagnosis not present

## 2020-09-28 DIAGNOSIS — N1832 Chronic kidney disease, stage 3b: Secondary | ICD-10-CM | POA: Diagnosis not present

## 2020-09-28 DIAGNOSIS — K219 Gastro-esophageal reflux disease without esophagitis: Secondary | ICD-10-CM | POA: Diagnosis not present

## 2020-09-28 DIAGNOSIS — Z87891 Personal history of nicotine dependence: Secondary | ICD-10-CM | POA: Diagnosis not present

## 2020-09-28 DIAGNOSIS — G4700 Insomnia, unspecified: Secondary | ICD-10-CM | POA: Diagnosis not present

## 2020-09-28 DIAGNOSIS — F32A Depression, unspecified: Secondary | ICD-10-CM | POA: Diagnosis not present

## 2020-09-28 DIAGNOSIS — K5900 Constipation, unspecified: Secondary | ICD-10-CM | POA: Diagnosis not present

## 2020-09-28 DIAGNOSIS — E1122 Type 2 diabetes mellitus with diabetic chronic kidney disease: Secondary | ICD-10-CM | POA: Diagnosis not present

## 2020-09-28 DIAGNOSIS — Z9181 History of falling: Secondary | ICD-10-CM | POA: Diagnosis not present

## 2020-09-28 DIAGNOSIS — M810 Age-related osteoporosis without current pathological fracture: Secondary | ICD-10-CM | POA: Diagnosis not present

## 2020-09-29 DIAGNOSIS — J449 Chronic obstructive pulmonary disease, unspecified: Secondary | ICD-10-CM | POA: Diagnosis not present

## 2020-09-29 DIAGNOSIS — I69322 Dysarthria following cerebral infarction: Secondary | ICD-10-CM | POA: Diagnosis not present

## 2020-09-29 DIAGNOSIS — Z9181 History of falling: Secondary | ICD-10-CM | POA: Diagnosis not present

## 2020-09-29 DIAGNOSIS — I69351 Hemiplegia and hemiparesis following cerebral infarction affecting right dominant side: Secondary | ICD-10-CM | POA: Diagnosis not present

## 2020-09-29 DIAGNOSIS — E876 Hypokalemia: Secondary | ICD-10-CM | POA: Diagnosis not present

## 2020-09-29 DIAGNOSIS — E1122 Type 2 diabetes mellitus with diabetic chronic kidney disease: Secondary | ICD-10-CM | POA: Diagnosis not present

## 2020-09-29 DIAGNOSIS — K219 Gastro-esophageal reflux disease without esophagitis: Secondary | ICD-10-CM | POA: Diagnosis not present

## 2020-09-29 DIAGNOSIS — M810 Age-related osteoporosis without current pathological fracture: Secondary | ICD-10-CM | POA: Diagnosis not present

## 2020-09-29 DIAGNOSIS — Z87891 Personal history of nicotine dependence: Secondary | ICD-10-CM | POA: Diagnosis not present

## 2020-09-29 DIAGNOSIS — K5900 Constipation, unspecified: Secondary | ICD-10-CM | POA: Diagnosis not present

## 2020-09-29 DIAGNOSIS — S52131D Displaced fracture of neck of right radius, subsequent encounter for closed fracture with routine healing: Secondary | ICD-10-CM | POA: Diagnosis not present

## 2020-09-29 DIAGNOSIS — G4700 Insomnia, unspecified: Secondary | ICD-10-CM | POA: Diagnosis not present

## 2020-09-29 DIAGNOSIS — I480 Paroxysmal atrial fibrillation: Secondary | ICD-10-CM | POA: Diagnosis not present

## 2020-09-29 DIAGNOSIS — F32A Depression, unspecified: Secondary | ICD-10-CM | POA: Diagnosis not present

## 2020-09-29 DIAGNOSIS — Z794 Long term (current) use of insulin: Secondary | ICD-10-CM | POA: Diagnosis not present

## 2020-09-29 DIAGNOSIS — Z7982 Long term (current) use of aspirin: Secondary | ICD-10-CM | POA: Diagnosis not present

## 2020-09-29 DIAGNOSIS — E785 Hyperlipidemia, unspecified: Secondary | ICD-10-CM | POA: Diagnosis not present

## 2020-09-29 DIAGNOSIS — E871 Hypo-osmolality and hyponatremia: Secondary | ICD-10-CM | POA: Diagnosis not present

## 2020-09-29 DIAGNOSIS — I69320 Aphasia following cerebral infarction: Secondary | ICD-10-CM | POA: Diagnosis not present

## 2020-09-29 DIAGNOSIS — E039 Hypothyroidism, unspecified: Secondary | ICD-10-CM | POA: Diagnosis not present

## 2020-09-29 DIAGNOSIS — N1832 Chronic kidney disease, stage 3b: Secondary | ICD-10-CM | POA: Diagnosis not present

## 2020-09-29 DIAGNOSIS — I129 Hypertensive chronic kidney disease with stage 1 through stage 4 chronic kidney disease, or unspecified chronic kidney disease: Secondary | ICD-10-CM | POA: Diagnosis not present

## 2020-09-30 DIAGNOSIS — Z743 Need for continuous supervision: Secondary | ICD-10-CM | POA: Diagnosis not present

## 2020-09-30 DIAGNOSIS — Z8673 Personal history of transient ischemic attack (TIA), and cerebral infarction without residual deficits: Secondary | ICD-10-CM | POA: Diagnosis not present

## 2020-09-30 DIAGNOSIS — S06379A Contusion, laceration, and hemorrhage of cerebellum with loss of consciousness of unspecified duration, initial encounter: Secondary | ICD-10-CM | POA: Diagnosis not present

## 2020-09-30 DIAGNOSIS — S0003XA Contusion of scalp, initial encounter: Secondary | ICD-10-CM | POA: Diagnosis not present

## 2020-09-30 DIAGNOSIS — R059 Cough, unspecified: Secondary | ICD-10-CM | POA: Diagnosis not present

## 2020-09-30 DIAGNOSIS — S199XXA Unspecified injury of neck, initial encounter: Secondary | ICD-10-CM | POA: Diagnosis not present

## 2020-09-30 DIAGNOSIS — Z885 Allergy status to narcotic agent status: Secondary | ICD-10-CM | POA: Diagnosis not present

## 2020-09-30 DIAGNOSIS — E876 Hypokalemia: Secondary | ICD-10-CM | POA: Diagnosis not present

## 2020-09-30 DIAGNOSIS — I480 Paroxysmal atrial fibrillation: Secondary | ICD-10-CM | POA: Diagnosis not present

## 2020-09-30 DIAGNOSIS — Z9181 History of falling: Secondary | ICD-10-CM | POA: Diagnosis not present

## 2020-09-30 DIAGNOSIS — I080 Rheumatic disorders of both mitral and aortic valves: Secondary | ICD-10-CM | POA: Diagnosis not present

## 2020-09-30 DIAGNOSIS — S066X0A Traumatic subarachnoid hemorrhage without loss of consciousness, initial encounter: Secondary | ICD-10-CM | POA: Diagnosis not present

## 2020-09-30 DIAGNOSIS — D6832 Hemorrhagic disorder due to extrinsic circulating anticoagulants: Secondary | ICD-10-CM | POA: Diagnosis not present

## 2020-09-30 DIAGNOSIS — E871 Hypo-osmolality and hyponatremia: Secondary | ICD-10-CM | POA: Diagnosis not present

## 2020-09-30 DIAGNOSIS — Z66 Do not resuscitate: Secondary | ICD-10-CM | POA: Diagnosis not present

## 2020-09-30 DIAGNOSIS — S066X9A Traumatic subarachnoid hemorrhage with loss of consciousness of unspecified duration, initial encounter: Secondary | ICD-10-CM | POA: Diagnosis not present

## 2020-09-30 DIAGNOSIS — Z888 Allergy status to other drugs, medicaments and biological substances status: Secondary | ICD-10-CM | POA: Diagnosis not present

## 2020-09-30 DIAGNOSIS — R6889 Other general symptoms and signs: Secondary | ICD-10-CM | POA: Diagnosis not present

## 2020-09-30 DIAGNOSIS — Z7901 Long term (current) use of anticoagulants: Secondary | ICD-10-CM | POA: Diagnosis not present

## 2020-09-30 DIAGNOSIS — Z7982 Long term (current) use of aspirin: Secondary | ICD-10-CM | POA: Diagnosis not present

## 2020-09-30 DIAGNOSIS — U071 COVID-19: Secondary | ICD-10-CM | POA: Diagnosis not present

## 2020-09-30 DIAGNOSIS — W19XXXA Unspecified fall, initial encounter: Secondary | ICD-10-CM | POA: Diagnosis not present

## 2020-09-30 DIAGNOSIS — I10 Essential (primary) hypertension: Secondary | ICD-10-CM | POA: Diagnosis not present

## 2020-09-30 DIAGNOSIS — N179 Acute kidney failure, unspecified: Secondary | ICD-10-CM | POA: Diagnosis not present

## 2020-09-30 DIAGNOSIS — R1310 Dysphagia, unspecified: Secondary | ICD-10-CM | POA: Diagnosis not present

## 2020-09-30 DIAGNOSIS — E1165 Type 2 diabetes mellitus with hyperglycemia: Secondary | ICD-10-CM | POA: Diagnosis not present

## 2020-09-30 DIAGNOSIS — E785 Hyperlipidemia, unspecified: Secondary | ICD-10-CM | POA: Diagnosis not present

## 2020-09-30 DIAGNOSIS — R4781 Slurred speech: Secondary | ICD-10-CM | POA: Diagnosis not present

## 2020-09-30 DIAGNOSIS — S065X0A Traumatic subdural hemorrhage without loss of consciousness, initial encounter: Secondary | ICD-10-CM | POA: Diagnosis not present

## 2020-09-30 DIAGNOSIS — Z515 Encounter for palliative care: Secondary | ICD-10-CM | POA: Diagnosis not present

## 2020-09-30 DIAGNOSIS — R531 Weakness: Secondary | ICD-10-CM | POA: Diagnosis not present

## 2020-09-30 DIAGNOSIS — G9341 Metabolic encephalopathy: Secondary | ICD-10-CM | POA: Diagnosis not present

## 2020-09-30 DIAGNOSIS — D72829 Elevated white blood cell count, unspecified: Secondary | ICD-10-CM | POA: Diagnosis not present

## 2020-09-30 DIAGNOSIS — S065X9A Traumatic subdural hemorrhage with loss of consciousness of unspecified duration, initial encounter: Secondary | ICD-10-CM | POA: Diagnosis not present

## 2020-09-30 DIAGNOSIS — R0600 Dyspnea, unspecified: Secondary | ICD-10-CM | POA: Diagnosis not present

## 2020-09-30 DIAGNOSIS — N3000 Acute cystitis without hematuria: Secondary | ICD-10-CM | POA: Diagnosis not present

## 2020-09-30 DIAGNOSIS — Z794 Long term (current) use of insulin: Secondary | ICD-10-CM | POA: Diagnosis not present

## 2020-09-30 DIAGNOSIS — S0990XA Unspecified injury of head, initial encounter: Secondary | ICD-10-CM | POA: Diagnosis not present

## 2020-10-01 DIAGNOSIS — S065X9A Traumatic subdural hemorrhage with loss of consciousness of unspecified duration, initial encounter: Secondary | ICD-10-CM | POA: Diagnosis not present

## 2020-10-01 DIAGNOSIS — E1165 Type 2 diabetes mellitus with hyperglycemia: Secondary | ICD-10-CM | POA: Diagnosis not present

## 2020-10-01 DIAGNOSIS — S0003XA Contusion of scalp, initial encounter: Secondary | ICD-10-CM | POA: Diagnosis not present

## 2020-10-01 DIAGNOSIS — D72829 Elevated white blood cell count, unspecified: Secondary | ICD-10-CM | POA: Diagnosis not present

## 2020-10-01 DIAGNOSIS — I480 Paroxysmal atrial fibrillation: Secondary | ICD-10-CM | POA: Diagnosis not present

## 2020-10-01 DIAGNOSIS — I10 Essential (primary) hypertension: Secondary | ICD-10-CM | POA: Diagnosis not present

## 2020-10-01 DIAGNOSIS — S066X9A Traumatic subarachnoid hemorrhage with loss of consciousness of unspecified duration, initial encounter: Secondary | ICD-10-CM | POA: Diagnosis not present

## 2020-10-01 DIAGNOSIS — Z794 Long term (current) use of insulin: Secondary | ICD-10-CM | POA: Diagnosis not present

## 2020-10-01 DIAGNOSIS — E871 Hypo-osmolality and hyponatremia: Secondary | ICD-10-CM | POA: Diagnosis not present

## 2020-10-02 DIAGNOSIS — S065X9A Traumatic subdural hemorrhage with loss of consciousness of unspecified duration, initial encounter: Secondary | ICD-10-CM | POA: Diagnosis not present

## 2020-10-03 DIAGNOSIS — S065X9A Traumatic subdural hemorrhage with loss of consciousness of unspecified duration, initial encounter: Secondary | ICD-10-CM | POA: Diagnosis not present

## 2020-10-04 DIAGNOSIS — E1165 Type 2 diabetes mellitus with hyperglycemia: Secondary | ICD-10-CM | POA: Diagnosis not present

## 2020-10-04 DIAGNOSIS — S065X9A Traumatic subdural hemorrhage with loss of consciousness of unspecified duration, initial encounter: Secondary | ICD-10-CM | POA: Diagnosis not present

## 2020-10-04 DIAGNOSIS — I10 Essential (primary) hypertension: Secondary | ICD-10-CM | POA: Diagnosis not present

## 2020-10-05 DIAGNOSIS — I480 Paroxysmal atrial fibrillation: Secondary | ICD-10-CM | POA: Diagnosis not present

## 2020-10-05 DIAGNOSIS — I10 Essential (primary) hypertension: Secondary | ICD-10-CM | POA: Diagnosis not present

## 2020-10-05 DIAGNOSIS — R059 Cough, unspecified: Secondary | ICD-10-CM | POA: Diagnosis not present

## 2020-10-05 DIAGNOSIS — R1310 Dysphagia, unspecified: Secondary | ICD-10-CM | POA: Diagnosis not present

## 2020-10-05 DIAGNOSIS — S065X9A Traumatic subdural hemorrhage with loss of consciousness of unspecified duration, initial encounter: Secondary | ICD-10-CM | POA: Diagnosis not present

## 2020-10-06 DIAGNOSIS — I10 Essential (primary) hypertension: Secondary | ICD-10-CM | POA: Diagnosis not present

## 2020-10-06 DIAGNOSIS — S065X9A Traumatic subdural hemorrhage with loss of consciousness of unspecified duration, initial encounter: Secondary | ICD-10-CM | POA: Diagnosis not present

## 2020-10-06 DIAGNOSIS — I480 Paroxysmal atrial fibrillation: Secondary | ICD-10-CM | POA: Diagnosis not present

## 2020-10-07 DIAGNOSIS — D72829 Elevated white blood cell count, unspecified: Secondary | ICD-10-CM | POA: Diagnosis not present

## 2020-10-07 DIAGNOSIS — E1165 Type 2 diabetes mellitus with hyperglycemia: Secondary | ICD-10-CM | POA: Diagnosis not present

## 2020-10-07 DIAGNOSIS — S066X9A Traumatic subarachnoid hemorrhage with loss of consciousness of unspecified duration, initial encounter: Secondary | ICD-10-CM | POA: Diagnosis not present

## 2020-10-07 DIAGNOSIS — Z794 Long term (current) use of insulin: Secondary | ICD-10-CM | POA: Diagnosis not present

## 2020-10-07 DIAGNOSIS — S065X9A Traumatic subdural hemorrhage with loss of consciousness of unspecified duration, initial encounter: Secondary | ICD-10-CM | POA: Diagnosis not present

## 2020-10-07 DIAGNOSIS — S0003XA Contusion of scalp, initial encounter: Secondary | ICD-10-CM | POA: Diagnosis not present

## 2020-10-07 DIAGNOSIS — I10 Essential (primary) hypertension: Secondary | ICD-10-CM | POA: Diagnosis not present

## 2020-10-07 DIAGNOSIS — I480 Paroxysmal atrial fibrillation: Secondary | ICD-10-CM | POA: Diagnosis not present

## 2020-10-08 DIAGNOSIS — S0003XA Contusion of scalp, initial encounter: Secondary | ICD-10-CM | POA: Diagnosis not present

## 2020-10-08 DIAGNOSIS — I10 Essential (primary) hypertension: Secondary | ICD-10-CM | POA: Diagnosis not present

## 2020-10-08 DIAGNOSIS — R059 Cough, unspecified: Secondary | ICD-10-CM | POA: Diagnosis not present

## 2020-10-08 DIAGNOSIS — Z794 Long term (current) use of insulin: Secondary | ICD-10-CM | POA: Diagnosis not present

## 2020-10-08 DIAGNOSIS — S065X9A Traumatic subdural hemorrhage with loss of consciousness of unspecified duration, initial encounter: Secondary | ICD-10-CM | POA: Diagnosis not present

## 2020-10-08 DIAGNOSIS — R1310 Dysphagia, unspecified: Secondary | ICD-10-CM | POA: Diagnosis not present

## 2020-10-08 DIAGNOSIS — I080 Rheumatic disorders of both mitral and aortic valves: Secondary | ICD-10-CM | POA: Diagnosis not present

## 2020-10-08 DIAGNOSIS — S066X9A Traumatic subarachnoid hemorrhage with loss of consciousness of unspecified duration, initial encounter: Secondary | ICD-10-CM | POA: Diagnosis not present

## 2020-10-08 DIAGNOSIS — E1165 Type 2 diabetes mellitus with hyperglycemia: Secondary | ICD-10-CM | POA: Diagnosis not present

## 2020-10-08 DIAGNOSIS — I480 Paroxysmal atrial fibrillation: Secondary | ICD-10-CM | POA: Diagnosis not present

## 2020-10-09 DIAGNOSIS — I10 Essential (primary) hypertension: Secondary | ICD-10-CM | POA: Diagnosis not present

## 2020-10-09 DIAGNOSIS — I480 Paroxysmal atrial fibrillation: Secondary | ICD-10-CM | POA: Diagnosis not present

## 2020-10-09 DIAGNOSIS — S065X9A Traumatic subdural hemorrhage with loss of consciousness of unspecified duration, initial encounter: Secondary | ICD-10-CM | POA: Diagnosis not present

## 2020-10-09 DIAGNOSIS — Z794 Long term (current) use of insulin: Secondary | ICD-10-CM | POA: Diagnosis not present

## 2020-10-09 DIAGNOSIS — S066X9A Traumatic subarachnoid hemorrhage with loss of consciousness of unspecified duration, initial encounter: Secondary | ICD-10-CM | POA: Diagnosis not present

## 2020-10-09 DIAGNOSIS — E1165 Type 2 diabetes mellitus with hyperglycemia: Secondary | ICD-10-CM | POA: Diagnosis not present

## 2020-10-09 DIAGNOSIS — S0003XA Contusion of scalp, initial encounter: Secondary | ICD-10-CM | POA: Diagnosis not present

## 2020-10-10 DIAGNOSIS — S065X9A Traumatic subdural hemorrhage with loss of consciousness of unspecified duration, initial encounter: Secondary | ICD-10-CM | POA: Diagnosis not present

## 2020-10-10 DIAGNOSIS — I480 Paroxysmal atrial fibrillation: Secondary | ICD-10-CM | POA: Diagnosis not present

## 2020-10-10 DIAGNOSIS — S0003XA Contusion of scalp, initial encounter: Secondary | ICD-10-CM | POA: Diagnosis not present

## 2020-10-10 DIAGNOSIS — I10 Essential (primary) hypertension: Secondary | ICD-10-CM | POA: Diagnosis not present

## 2020-10-11 DIAGNOSIS — S065X9A Traumatic subdural hemorrhage with loss of consciousness of unspecified duration, initial encounter: Secondary | ICD-10-CM | POA: Diagnosis not present

## 2020-10-11 DIAGNOSIS — I10 Essential (primary) hypertension: Secondary | ICD-10-CM | POA: Diagnosis not present

## 2020-10-11 DIAGNOSIS — Z794 Long term (current) use of insulin: Secondary | ICD-10-CM | POA: Diagnosis not present

## 2020-10-11 DIAGNOSIS — S0003XA Contusion of scalp, initial encounter: Secondary | ICD-10-CM | POA: Diagnosis not present

## 2020-10-11 DIAGNOSIS — E1165 Type 2 diabetes mellitus with hyperglycemia: Secondary | ICD-10-CM | POA: Diagnosis not present

## 2020-10-11 DIAGNOSIS — I480 Paroxysmal atrial fibrillation: Secondary | ICD-10-CM | POA: Diagnosis not present

## 2020-10-11 DIAGNOSIS — S066X9A Traumatic subarachnoid hemorrhage with loss of consciousness of unspecified duration, initial encounter: Secondary | ICD-10-CM | POA: Diagnosis not present

## 2020-10-12 DIAGNOSIS — S065X9A Traumatic subdural hemorrhage with loss of consciousness of unspecified duration, initial encounter: Secondary | ICD-10-CM | POA: Diagnosis not present

## 2020-10-13 DIAGNOSIS — S065X9A Traumatic subdural hemorrhage with loss of consciousness of unspecified duration, initial encounter: Secondary | ICD-10-CM | POA: Diagnosis not present

## 2020-10-14 DIAGNOSIS — S065X9A Traumatic subdural hemorrhage with loss of consciousness of unspecified duration, initial encounter: Secondary | ICD-10-CM | POA: Diagnosis not present

## 2020-10-15 DIAGNOSIS — S065X9A Traumatic subdural hemorrhage with loss of consciousness of unspecified duration, initial encounter: Secondary | ICD-10-CM | POA: Diagnosis not present

## 2020-10-16 DIAGNOSIS — S065X9A Traumatic subdural hemorrhage with loss of consciousness of unspecified duration, initial encounter: Secondary | ICD-10-CM | POA: Diagnosis not present

## 2020-10-17 DIAGNOSIS — S065X9A Traumatic subdural hemorrhage with loss of consciousness of unspecified duration, initial encounter: Secondary | ICD-10-CM | POA: Diagnosis not present

## 2020-10-18 DIAGNOSIS — S065X9A Traumatic subdural hemorrhage with loss of consciousness of unspecified duration, initial encounter: Secondary | ICD-10-CM | POA: Diagnosis not present

## 2020-10-19 DIAGNOSIS — E871 Hypo-osmolality and hyponatremia: Secondary | ICD-10-CM | POA: Diagnosis not present

## 2020-10-19 DIAGNOSIS — I480 Paroxysmal atrial fibrillation: Secondary | ICD-10-CM | POA: Diagnosis not present

## 2020-10-19 DIAGNOSIS — E1165 Type 2 diabetes mellitus with hyperglycemia: Secondary | ICD-10-CM | POA: Diagnosis not present

## 2020-10-19 DIAGNOSIS — Z794 Long term (current) use of insulin: Secondary | ICD-10-CM | POA: Diagnosis not present

## 2020-10-19 DIAGNOSIS — S0003XA Contusion of scalp, initial encounter: Secondary | ICD-10-CM | POA: Diagnosis not present

## 2020-10-19 DIAGNOSIS — S065X9A Traumatic subdural hemorrhage with loss of consciousness of unspecified duration, initial encounter: Secondary | ICD-10-CM | POA: Diagnosis not present

## 2020-10-19 DIAGNOSIS — S066X9A Traumatic subarachnoid hemorrhage with loss of consciousness of unspecified duration, initial encounter: Secondary | ICD-10-CM | POA: Diagnosis not present

## 2020-10-19 DIAGNOSIS — I10 Essential (primary) hypertension: Secondary | ICD-10-CM | POA: Diagnosis not present

## 2020-10-19 DIAGNOSIS — E876 Hypokalemia: Secondary | ICD-10-CM | POA: Diagnosis not present

## 2020-10-20 DIAGNOSIS — Z794 Long term (current) use of insulin: Secondary | ICD-10-CM | POA: Diagnosis not present

## 2020-10-20 DIAGNOSIS — I480 Paroxysmal atrial fibrillation: Secondary | ICD-10-CM | POA: Diagnosis not present

## 2020-10-20 DIAGNOSIS — S066X9A Traumatic subarachnoid hemorrhage with loss of consciousness of unspecified duration, initial encounter: Secondary | ICD-10-CM | POA: Diagnosis not present

## 2020-10-20 DIAGNOSIS — S065X9A Traumatic subdural hemorrhage with loss of consciousness of unspecified duration, initial encounter: Secondary | ICD-10-CM | POA: Diagnosis not present

## 2020-10-20 DIAGNOSIS — E871 Hypo-osmolality and hyponatremia: Secondary | ICD-10-CM | POA: Diagnosis not present

## 2020-10-20 DIAGNOSIS — I10 Essential (primary) hypertension: Secondary | ICD-10-CM | POA: Diagnosis not present

## 2020-10-20 DIAGNOSIS — S0003XA Contusion of scalp, initial encounter: Secondary | ICD-10-CM | POA: Diagnosis not present

## 2020-10-20 DIAGNOSIS — E876 Hypokalemia: Secondary | ICD-10-CM | POA: Diagnosis not present

## 2020-10-20 DIAGNOSIS — E1165 Type 2 diabetes mellitus with hyperglycemia: Secondary | ICD-10-CM | POA: Diagnosis not present

## 2020-10-21 DIAGNOSIS — I10 Essential (primary) hypertension: Secondary | ICD-10-CM | POA: Diagnosis not present

## 2020-10-21 DIAGNOSIS — Z794 Long term (current) use of insulin: Secondary | ICD-10-CM | POA: Diagnosis not present

## 2020-10-21 DIAGNOSIS — R531 Weakness: Secondary | ICD-10-CM | POA: Diagnosis not present

## 2020-10-21 DIAGNOSIS — S0003XA Contusion of scalp, initial encounter: Secondary | ICD-10-CM | POA: Diagnosis not present

## 2020-10-21 DIAGNOSIS — E1165 Type 2 diabetes mellitus with hyperglycemia: Secondary | ICD-10-CM | POA: Diagnosis not present

## 2020-10-21 DIAGNOSIS — I480 Paroxysmal atrial fibrillation: Secondary | ICD-10-CM | POA: Diagnosis not present

## 2020-10-21 DIAGNOSIS — S065X9A Traumatic subdural hemorrhage with loss of consciousness of unspecified duration, initial encounter: Secondary | ICD-10-CM | POA: Diagnosis not present

## 2020-10-21 DIAGNOSIS — E876 Hypokalemia: Secondary | ICD-10-CM | POA: Diagnosis not present

## 2020-10-21 DIAGNOSIS — S066X9A Traumatic subarachnoid hemorrhage with loss of consciousness of unspecified duration, initial encounter: Secondary | ICD-10-CM | POA: Diagnosis not present

## 2020-10-21 DIAGNOSIS — E871 Hypo-osmolality and hyponatremia: Secondary | ICD-10-CM | POA: Diagnosis not present

## 2020-10-22 DIAGNOSIS — I480 Paroxysmal atrial fibrillation: Secondary | ICD-10-CM | POA: Diagnosis not present

## 2020-10-22 DIAGNOSIS — E871 Hypo-osmolality and hyponatremia: Secondary | ICD-10-CM | POA: Diagnosis not present

## 2020-10-22 DIAGNOSIS — Z794 Long term (current) use of insulin: Secondary | ICD-10-CM | POA: Diagnosis not present

## 2020-10-22 DIAGNOSIS — S065X9A Traumatic subdural hemorrhage with loss of consciousness of unspecified duration, initial encounter: Secondary | ICD-10-CM | POA: Diagnosis not present

## 2020-10-22 DIAGNOSIS — S0003XA Contusion of scalp, initial encounter: Secondary | ICD-10-CM | POA: Diagnosis not present

## 2020-10-22 DIAGNOSIS — E1165 Type 2 diabetes mellitus with hyperglycemia: Secondary | ICD-10-CM | POA: Diagnosis not present

## 2020-10-22 DIAGNOSIS — I10 Essential (primary) hypertension: Secondary | ICD-10-CM | POA: Diagnosis not present

## 2020-10-22 DIAGNOSIS — E876 Hypokalemia: Secondary | ICD-10-CM | POA: Diagnosis not present

## 2020-10-22 DIAGNOSIS — S066X9A Traumatic subarachnoid hemorrhage with loss of consciousness of unspecified duration, initial encounter: Secondary | ICD-10-CM | POA: Diagnosis not present

## 2020-10-23 DIAGNOSIS — I10 Essential (primary) hypertension: Secondary | ICD-10-CM | POA: Diagnosis not present

## 2020-10-23 DIAGNOSIS — S0003XA Contusion of scalp, initial encounter: Secondary | ICD-10-CM | POA: Diagnosis not present

## 2020-10-23 DIAGNOSIS — E876 Hypokalemia: Secondary | ICD-10-CM | POA: Diagnosis not present

## 2020-10-23 DIAGNOSIS — I480 Paroxysmal atrial fibrillation: Secondary | ICD-10-CM | POA: Diagnosis not present

## 2020-10-23 DIAGNOSIS — E1165 Type 2 diabetes mellitus with hyperglycemia: Secondary | ICD-10-CM | POA: Diagnosis not present

## 2020-10-23 DIAGNOSIS — S065X9A Traumatic subdural hemorrhage with loss of consciousness of unspecified duration, initial encounter: Secondary | ICD-10-CM | POA: Diagnosis not present

## 2020-10-23 DIAGNOSIS — Z794 Long term (current) use of insulin: Secondary | ICD-10-CM | POA: Diagnosis not present

## 2020-10-23 DIAGNOSIS — E871 Hypo-osmolality and hyponatremia: Secondary | ICD-10-CM | POA: Diagnosis not present

## 2020-10-23 DIAGNOSIS — S066X9A Traumatic subarachnoid hemorrhage with loss of consciousness of unspecified duration, initial encounter: Secondary | ICD-10-CM | POA: Diagnosis not present

## 2020-10-24 DIAGNOSIS — E1165 Type 2 diabetes mellitus with hyperglycemia: Secondary | ICD-10-CM | POA: Diagnosis not present

## 2020-10-24 DIAGNOSIS — S0003XA Contusion of scalp, initial encounter: Secondary | ICD-10-CM | POA: Diagnosis not present

## 2020-10-24 DIAGNOSIS — I480 Paroxysmal atrial fibrillation: Secondary | ICD-10-CM | POA: Diagnosis not present

## 2020-10-24 DIAGNOSIS — S066X9A Traumatic subarachnoid hemorrhage with loss of consciousness of unspecified duration, initial encounter: Secondary | ICD-10-CM | POA: Diagnosis not present

## 2020-10-24 DIAGNOSIS — E876 Hypokalemia: Secondary | ICD-10-CM | POA: Diagnosis not present

## 2020-10-24 DIAGNOSIS — S065X9A Traumatic subdural hemorrhage with loss of consciousness of unspecified duration, initial encounter: Secondary | ICD-10-CM | POA: Diagnosis not present

## 2020-10-24 DIAGNOSIS — E871 Hypo-osmolality and hyponatremia: Secondary | ICD-10-CM | POA: Diagnosis not present

## 2020-10-24 DIAGNOSIS — Z794 Long term (current) use of insulin: Secondary | ICD-10-CM | POA: Diagnosis not present

## 2020-10-24 DIAGNOSIS — I10 Essential (primary) hypertension: Secondary | ICD-10-CM | POA: Diagnosis not present

## 2020-10-28 DIAGNOSIS — R1310 Dysphagia, unspecified: Secondary | ICD-10-CM | POA: Diagnosis not present

## 2020-10-28 DIAGNOSIS — Z515 Encounter for palliative care: Secondary | ICD-10-CM | POA: Diagnosis not present

## 2020-10-28 DIAGNOSIS — I10 Essential (primary) hypertension: Secondary | ICD-10-CM | POA: Diagnosis not present

## 2020-10-28 DIAGNOSIS — R0600 Dyspnea, unspecified: Secondary | ICD-10-CM | POA: Diagnosis not present

## 2020-10-28 DIAGNOSIS — S0003XA Contusion of scalp, initial encounter: Secondary | ICD-10-CM | POA: Diagnosis not present

## 2020-11-05 DEATH — deceased

## 2021-07-22 ENCOUNTER — Telehealth: Payer: Self-pay | Admitting: Family Medicine

## 2021-07-22 NOTE — Telephone Encounter (Signed)
ATC pt to schedule cpe but mailbox is full ?

## 2022-12-22 IMAGING — CT CT CERVICAL SPINE W/O CM
3 of 4 series · 13 of 33 positions shown, 16 images · non-contrast
Comparison: 04/28/2020

CLINICAL DATA: Head trauma, neck trauma

EXAM:
CT HEAD WITHOUT CONTRAST
CT CERVICAL SPINE WITHOUT CONTRAST
TECHNIQUE: Multidetector CT imaging of the head and cervical spine was
performed following the standard protocol without intravenous
contrast. Multiplanar CT image reconstructions of the cervical spine
were also generated.

[Series 8: sag bone · sagittal · 0.31mm/px · 5 of 75 slices shown, 6 images]
[im 25/75  bone]
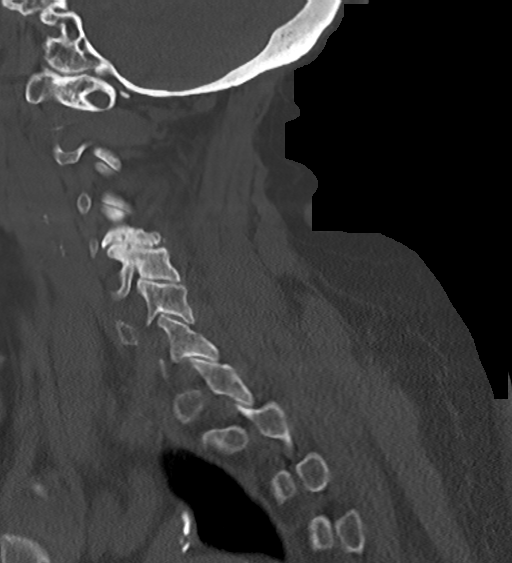
[im 31/75  bone]
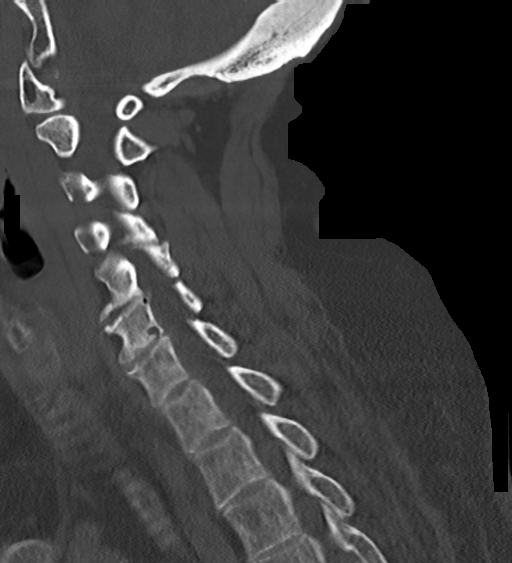
[im 38/75  soft-tissue]
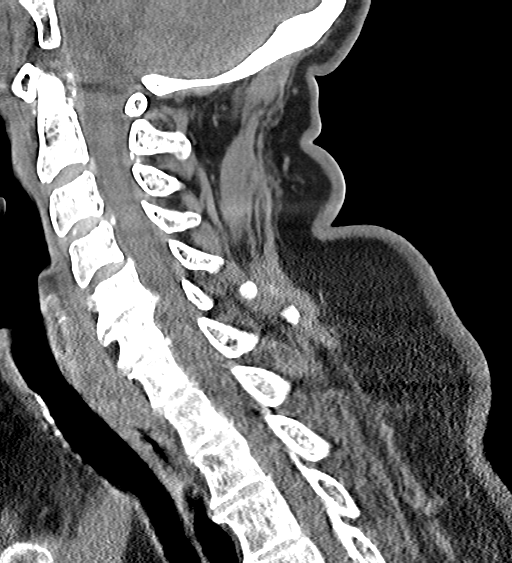
[im 38/75  bone]
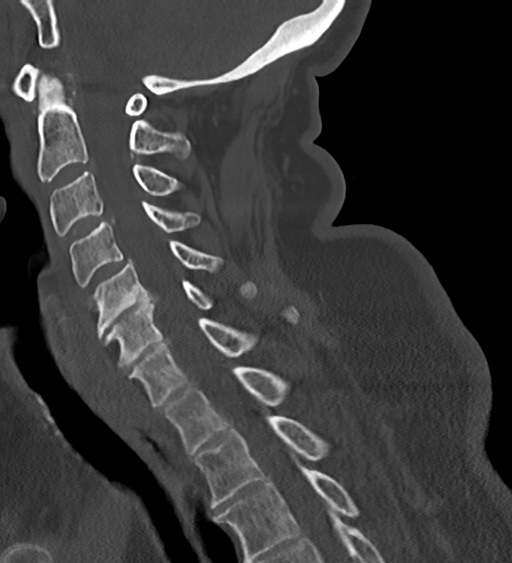
[im 44/75  bone]
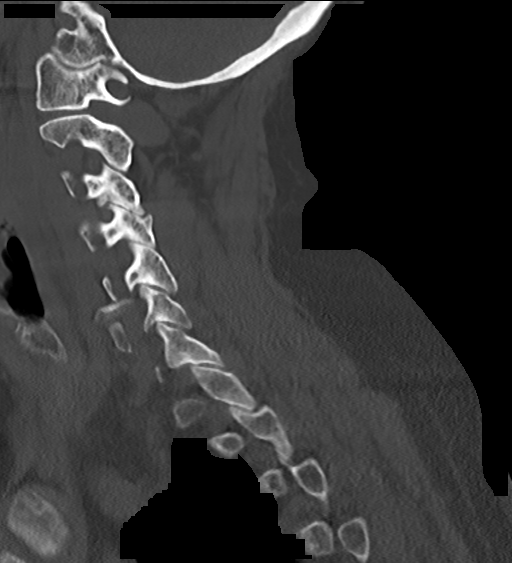
[im 50/75  bone]
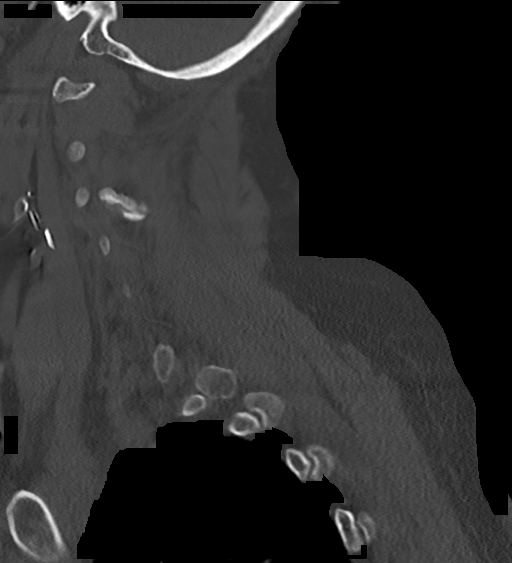

[Series 9: cor bone · coronal · 0.29mm/px · 3 of 81 slices shown]
[im 17/81  bone]
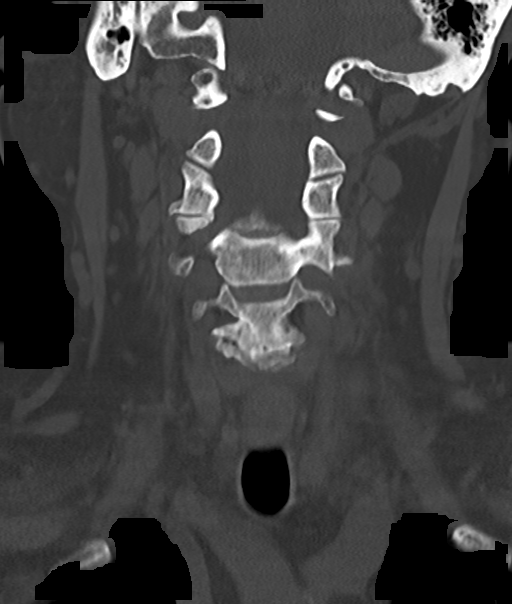
[im 33/81  bone]
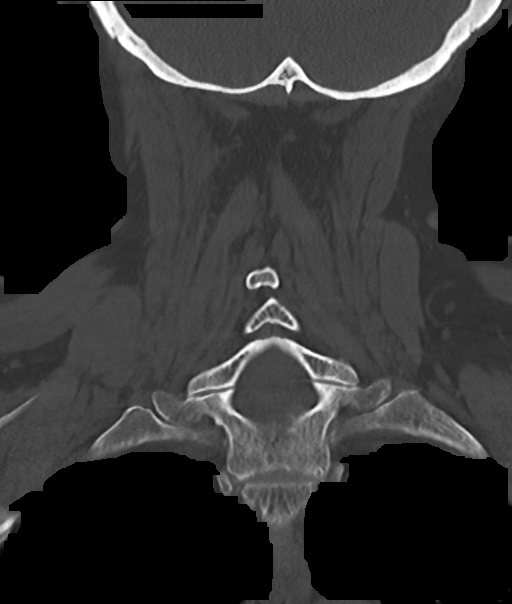
[im 49/81  bone]
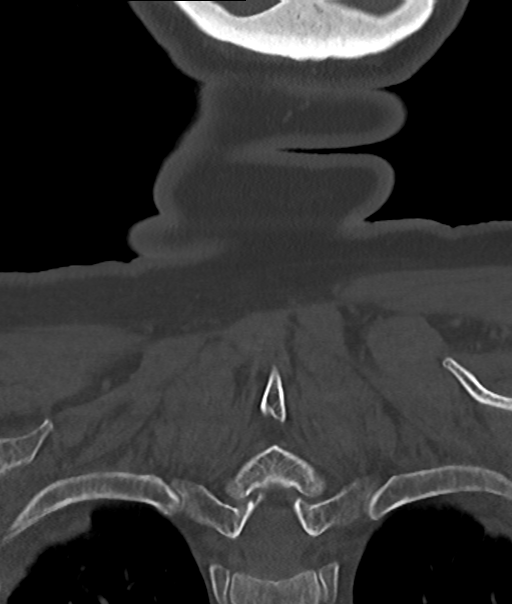

[Series 10: orthogonal axials · axial · 0.21mm/px · z∈[-279,-168]mm · 5 of 96 slices shown, 7 images]
[im 16/96  soft-tissue]
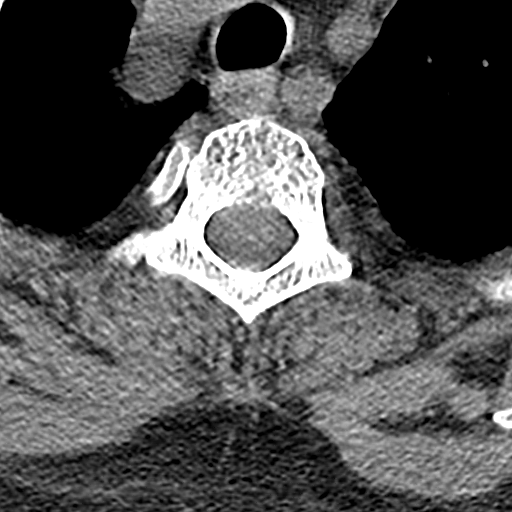
[im 16/96  bone]
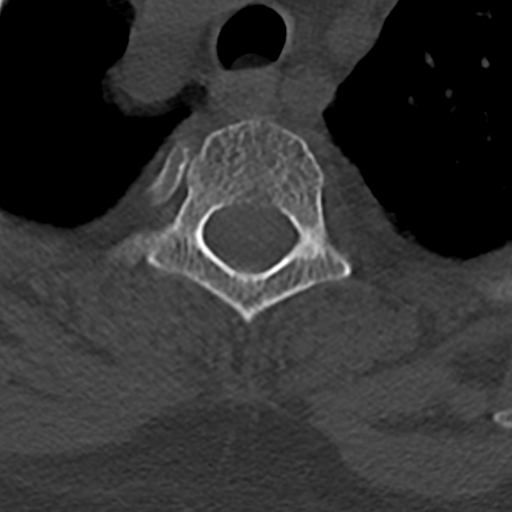
[im 32/96  bone]
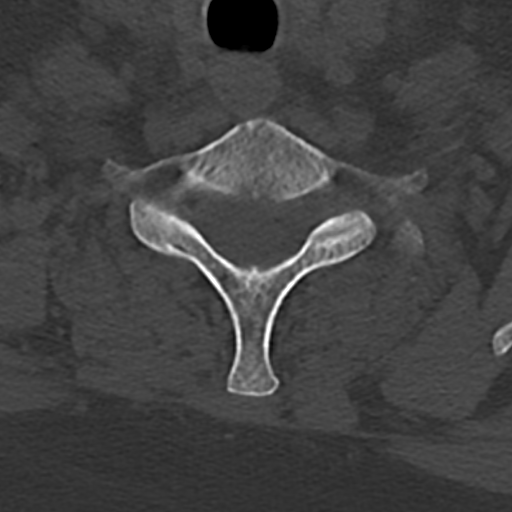
[im 48/96  bone]
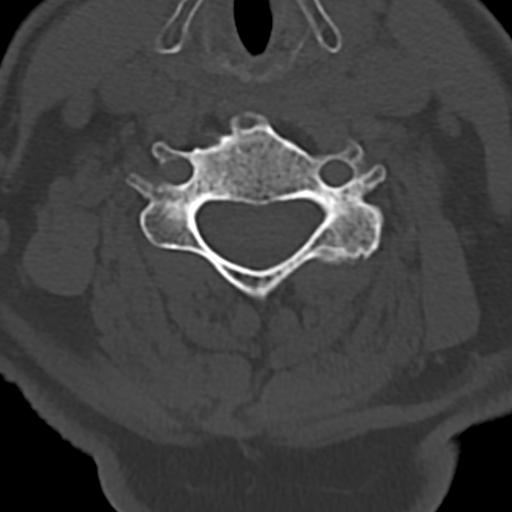
[im 64/96  bone]
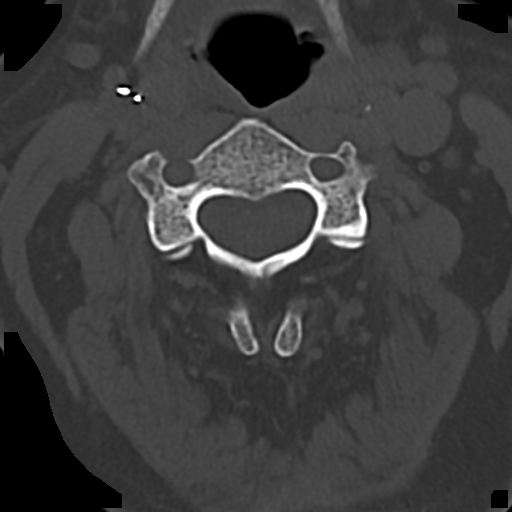
[im 80/96  soft-tissue]
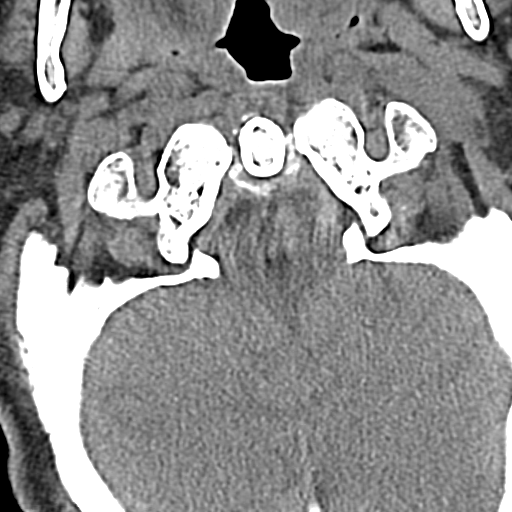
[im 80/96  bone]
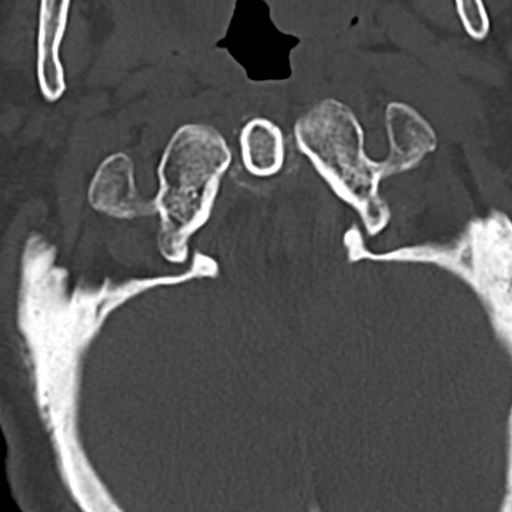

[13 of 33 positions shown; findings below may reference images not displayed]

FINDINGS: CT HEAD FINDINGS

Brain: Normal anatomic configuration. Parenchymal volume loss is
commensurate with the patient's age. Moderate subcortical and
periventricular white matter changes are present likely reflecting
the sequela of small vessel ischemia. Encephalomalacia related to
bilateral PCA and left MCA territory infarcts are again identified
involving the occipital lobes bilaterally and posterior left
temporal lobe. Remote lacunar infarct noted within the a left
caudate nucleus and periventricular white matter as well as the
cerebellar hemispheres bilaterally. No abnormal intra or extra-axial
mass lesion or fluid collection. No abnormal mass effect or midline
shift. No evidence of acute intracranial hemorrhage or infarct.
Ventricular size is normal. Cerebellum unremarkable.

Vascular: There is asymmetric hyperdensity within the right middle
cerebral artery, however, this appears similar to that noted on
prior examination in this likely represents the sequela of
intracranial atherosclerotic disease or artifact related to in plane
imaging.

Skull: Intact

Sinuses/Orbits: The root of the left posterior maxillary molar
protrudes into the left maxillary sinus and there is associated
mucous retention cyst within the sinus. Remaining paranasal sinuses
are clear. The orbits are unremarkable.

Other: Mastoid air cells and middle ear cavities are clear.

CT CERVICAL SPINE FINDINGS

Alignment: Normal.

Skull base and vertebrae: No acute fracture. No primary bone lesion
or focal pathologic process.

Soft tissues and spinal canal: No prevertebral fluid or swelling. No
visible canal hematoma. There is mild central canal stenosis at C5-6
secondary to a posterior disc osteophyte complex resulting in an AP
diameter of the canal of approximately 8 mm. This abuts, but does
not remodel the thecal sac. The spinal canal is otherwise widely
patent.

Disc levels: There is intervertebral disc space narrowing and
endplate remodeling at C5-6 and C6-7 with small posteriorly oriented
disc osteophytes at these levels. Congenital shortening of the
pedicles in combination with asymmetric uncovertebral and facet
arthrosis results in moderate to severe left neuroforaminal
narrowing at C4-5, and right neuroforaminal narrowing at C5-6.,

Upper chest: Unremarkable

Other: None
IMPRESSION: No acute intracranial abnormality.  No calvarial fracture.

Extensive senescent change and multiple remote infarcts as described
above.

No acute fracture or listhesis of the cervical spine.

Degenerative disc and degenerative joint disease of the cervical
spine resulting in mild central canal stenosis at C5-6 and
multilevel neuroforaminal narrowing as described above.

## 2022-12-22 IMAGING — DX DG PORTABLE PELVIS
1 series · 1 of 1 positions shown · non-contrast
Comparison: 07/15/2020.

CLINICAL DATA: Status post fall.

EXAM:
PORTABLE PELVIS 1-2 VIEWS

[pelvis ap]
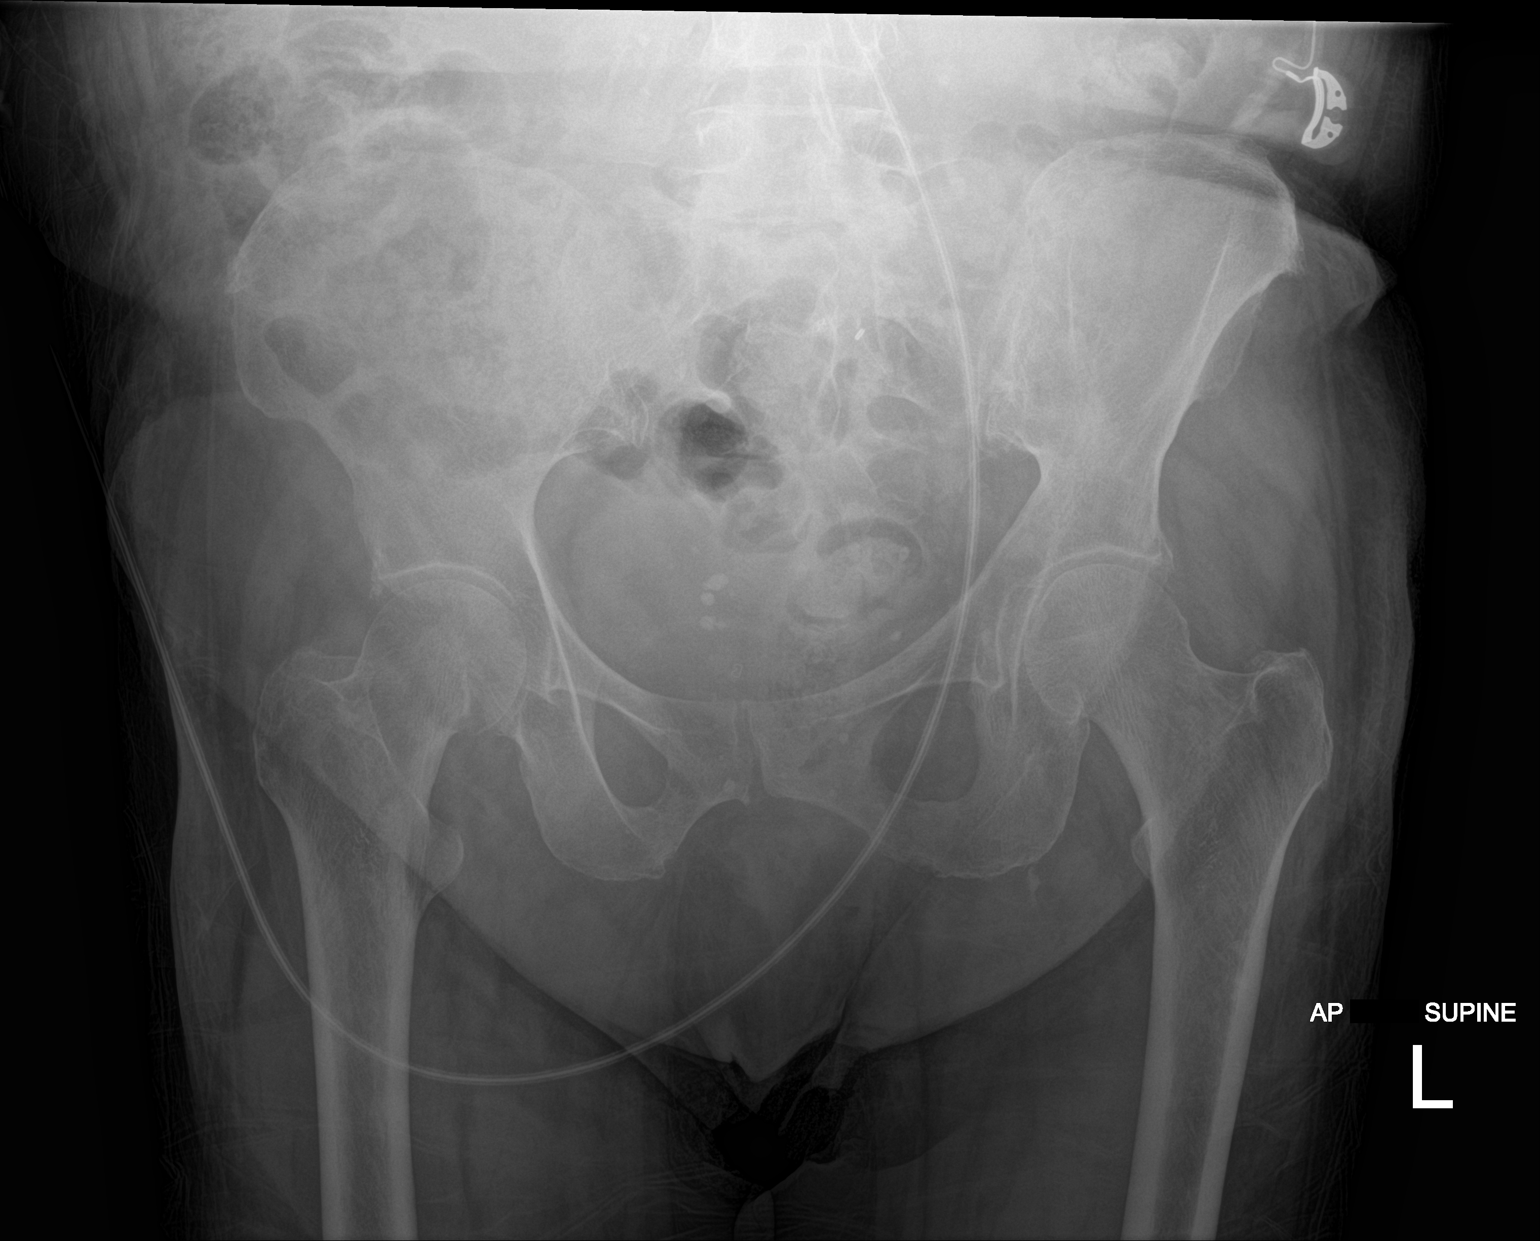

[1 of 1 positions shown; findings below may reference images not displayed]

FINDINGS: Diffuse osteopenia. Degenerative changes lumbar spine and both hips.
No acute bony abnormality identified. Pelvic calcifications
consistent phleboliths. Metallic density noted scratched it tiny
metallic density noted pelvis. Aortoiliac and peripheral vascular
calcification.
IMPRESSION: Diffuse osteopenia. Degenerative changes lumbar spine and both hips.
No acute abnormality identified.
